# Patient Record
Sex: Female | Born: 1940 | Race: White | Hispanic: No | Marital: Single | Smoking: Never smoker
Health system: Southern US, Community
[De-identification: ages and names within clinical notes are randomized; demographics above are authoritative.]

## PROBLEM LIST (undated history)

## (undated) DIAGNOSIS — I4892 Unspecified atrial flutter: Secondary | ICD-10-CM

## (undated) DIAGNOSIS — M519 Unspecified thoracic, thoracolumbar and lumbosacral intervertebral disc disorder: Secondary | ICD-10-CM

## (undated) DIAGNOSIS — F419 Anxiety disorder, unspecified: Secondary | ICD-10-CM

## (undated) DIAGNOSIS — J9690 Respiratory failure, unspecified, unspecified whether with hypoxia or hypercapnia: Secondary | ICD-10-CM

## (undated) DIAGNOSIS — I480 Paroxysmal atrial fibrillation: Secondary | ICD-10-CM

## (undated) DIAGNOSIS — F329 Major depressive disorder, single episode, unspecified: Secondary | ICD-10-CM

## (undated) DIAGNOSIS — E669 Obesity, unspecified: Secondary | ICD-10-CM

## (undated) DIAGNOSIS — I10 Essential (primary) hypertension: Secondary | ICD-10-CM

## (undated) DIAGNOSIS — J449 Chronic obstructive pulmonary disease, unspecified: Secondary | ICD-10-CM

## (undated) DIAGNOSIS — E785 Hyperlipidemia, unspecified: Secondary | ICD-10-CM

## (undated) DIAGNOSIS — G4733 Obstructive sleep apnea (adult) (pediatric): Secondary | ICD-10-CM

## (undated) DIAGNOSIS — F32A Depression, unspecified: Secondary | ICD-10-CM

## (undated) DIAGNOSIS — J45909 Unspecified asthma, uncomplicated: Secondary | ICD-10-CM

## (undated) HISTORY — DX: Paroxysmal atrial fibrillation: I48.0

## (undated) HISTORY — PX: CATARACT EXTRACTION: SUR2

## (undated) HISTORY — DX: Unspecified thoracic, thoracolumbar and lumbosacral intervertebral disc disorder: M51.9

## (undated) HISTORY — PX: LASIK: SHX215

## (undated) HISTORY — DX: Essential (primary) hypertension: I10

## (undated) HISTORY — DX: Chronic obstructive pulmonary disease, unspecified: J44.9

## (undated) HISTORY — DX: Obstructive sleep apnea (adult) (pediatric): G47.33

## (undated) HISTORY — DX: Major depressive disorder, single episode, unspecified: F32.9

## (undated) HISTORY — DX: Anxiety disorder, unspecified: F41.9

## (undated) HISTORY — PX: ANTERIOR FUSION LUMBAR SPINE: SUR629

## (undated) HISTORY — DX: Unspecified atrial flutter: I48.92

## (undated) HISTORY — PX: BACK SURGERY: SHX140

## (undated) HISTORY — DX: Respiratory failure, unspecified, unspecified whether with hypoxia or hypercapnia: J96.90

## (undated) HISTORY — DX: Obesity, unspecified: E66.9

## (undated) HISTORY — DX: Hyperlipidemia, unspecified: E78.5

## (undated) HISTORY — DX: Depression, unspecified: F32.A

---

## 2003-04-02 ENCOUNTER — Ambulatory Visit: Admission: RE | Admit: 2003-04-02 | Discharge: 2003-04-02 | Payer: Self-pay | Admitting: Internal Medicine

## 2004-04-03 ENCOUNTER — Ambulatory Visit: Payer: Self-pay | Admitting: Internal Medicine

## 2004-05-14 ENCOUNTER — Ambulatory Visit: Payer: Self-pay | Admitting: Internal Medicine

## 2004-07-25 ENCOUNTER — Ambulatory Visit: Payer: Self-pay | Admitting: Internal Medicine

## 2004-08-14 ENCOUNTER — Ambulatory Visit: Payer: Self-pay | Admitting: Internal Medicine

## 2004-09-12 ENCOUNTER — Ambulatory Visit: Payer: Self-pay | Admitting: Internal Medicine

## 2004-11-17 ENCOUNTER — Ambulatory Visit: Payer: Self-pay | Admitting: Cardiology

## 2004-12-05 ENCOUNTER — Ambulatory Visit: Payer: Self-pay | Admitting: Internal Medicine

## 2005-01-23 ENCOUNTER — Ambulatory Visit: Payer: Self-pay | Admitting: Internal Medicine

## 2005-04-16 ENCOUNTER — Ambulatory Visit: Payer: Self-pay | Admitting: Internal Medicine

## 2005-05-22 ENCOUNTER — Ambulatory Visit: Payer: Self-pay | Admitting: Internal Medicine

## 2005-08-04 ENCOUNTER — Ambulatory Visit: Payer: Self-pay | Admitting: Internal Medicine

## 2005-11-25 ENCOUNTER — Ambulatory Visit: Payer: Self-pay | Admitting: Internal Medicine

## 2005-12-14 ENCOUNTER — Ambulatory Visit: Payer: Self-pay | Admitting: Cardiology

## 2006-01-04 ENCOUNTER — Ambulatory Visit: Payer: Self-pay | Admitting: Internal Medicine

## 2006-04-21 ENCOUNTER — Ambulatory Visit (HOSPITAL_COMMUNITY): Admission: RE | Admit: 2006-04-21 | Discharge: 2006-04-21 | Payer: Self-pay | Admitting: Unknown Physician Specialty

## 2006-05-21 ENCOUNTER — Ambulatory Visit: Payer: Self-pay | Admitting: Internal Medicine

## 2006-05-24 ENCOUNTER — Ambulatory Visit: Payer: Self-pay | Admitting: Internal Medicine

## 2006-07-09 ENCOUNTER — Ambulatory Visit: Payer: Self-pay | Admitting: Cardiology

## 2006-07-16 ENCOUNTER — Ambulatory Visit: Payer: Self-pay | Admitting: Cardiology

## 2006-11-02 ENCOUNTER — Ambulatory Visit: Payer: Self-pay | Admitting: Internal Medicine

## 2006-11-15 ENCOUNTER — Ambulatory Visit: Payer: Self-pay | Admitting: Cardiology

## 2006-11-22 ENCOUNTER — Ambulatory Visit: Payer: Self-pay | Admitting: Internal Medicine

## 2007-02-03 ENCOUNTER — Ambulatory Visit: Payer: Self-pay | Admitting: Cardiology

## 2007-04-05 ENCOUNTER — Ambulatory Visit: Payer: Self-pay | Admitting: Internal Medicine

## 2007-05-20 DIAGNOSIS — J209 Acute bronchitis, unspecified: Secondary | ICD-10-CM

## 2007-05-20 DIAGNOSIS — I480 Paroxysmal atrial fibrillation: Secondary | ICD-10-CM

## 2007-05-20 DIAGNOSIS — G4733 Obstructive sleep apnea (adult) (pediatric): Secondary | ICD-10-CM | POA: Insufficient documentation

## 2007-05-20 DIAGNOSIS — K219 Gastro-esophageal reflux disease without esophagitis: Secondary | ICD-10-CM | POA: Insufficient documentation

## 2007-05-23 ENCOUNTER — Ambulatory Visit: Payer: Self-pay | Admitting: Internal Medicine

## 2007-07-22 ENCOUNTER — Ambulatory Visit: Payer: Self-pay | Admitting: Internal Medicine

## 2007-08-09 ENCOUNTER — Encounter: Payer: Self-pay | Admitting: Internal Medicine

## 2007-09-09 ENCOUNTER — Encounter: Payer: Self-pay | Admitting: Cardiology

## 2007-09-15 ENCOUNTER — Ambulatory Visit: Payer: Self-pay | Admitting: Internal Medicine

## 2007-10-27 ENCOUNTER — Ambulatory Visit: Payer: Self-pay | Admitting: Cardiology

## 2007-11-02 ENCOUNTER — Ambulatory Visit: Payer: Self-pay | Admitting: Cardiology

## 2007-11-11 ENCOUNTER — Ambulatory Visit: Payer: Self-pay | Admitting: Cardiology

## 2007-11-24 ENCOUNTER — Ambulatory Visit (HOSPITAL_COMMUNITY): Payer: Self-pay | Admitting: Psychiatry

## 2007-12-07 ENCOUNTER — Ambulatory Visit (HOSPITAL_COMMUNITY): Payer: Self-pay | Admitting: Psychiatry

## 2007-12-15 ENCOUNTER — Ambulatory Visit (HOSPITAL_COMMUNITY): Payer: Self-pay | Admitting: Psychiatry

## 2007-12-20 ENCOUNTER — Ambulatory Visit: Payer: Self-pay | Admitting: Cardiology

## 2007-12-27 ENCOUNTER — Ambulatory Visit (HOSPITAL_COMMUNITY): Payer: Self-pay | Admitting: Psychiatry

## 2008-01-10 ENCOUNTER — Ambulatory Visit (HOSPITAL_COMMUNITY): Payer: Self-pay | Admitting: Psychiatry

## 2008-01-24 ENCOUNTER — Ambulatory Visit (HOSPITAL_COMMUNITY): Payer: Self-pay | Admitting: Psychiatry

## 2008-02-02 ENCOUNTER — Ambulatory Visit (HOSPITAL_COMMUNITY): Payer: Self-pay | Admitting: Psychiatry

## 2008-02-20 ENCOUNTER — Ambulatory Visit (HOSPITAL_COMMUNITY): Payer: Self-pay | Admitting: Psychiatry

## 2008-03-07 ENCOUNTER — Ambulatory Visit (HOSPITAL_COMMUNITY): Payer: Self-pay | Admitting: Psychiatry

## 2008-03-26 ENCOUNTER — Ambulatory Visit (HOSPITAL_COMMUNITY): Payer: Self-pay | Admitting: Psychiatry

## 2008-03-30 HISTORY — PX: TOTAL HIP ARTHROPLASTY: SHX124

## 2008-04-03 ENCOUNTER — Ambulatory Visit: Payer: Self-pay | Admitting: Internal Medicine

## 2008-04-09 ENCOUNTER — Ambulatory Visit: Payer: Self-pay | Admitting: Cardiology

## 2008-04-09 ENCOUNTER — Ambulatory Visit (HOSPITAL_COMMUNITY): Payer: Self-pay | Admitting: Psychiatry

## 2008-04-23 ENCOUNTER — Ambulatory Visit (HOSPITAL_COMMUNITY): Payer: Self-pay | Admitting: Psychiatry

## 2008-05-15 ENCOUNTER — Encounter: Payer: Self-pay | Admitting: Cardiology

## 2008-05-16 ENCOUNTER — Ambulatory Visit (HOSPITAL_COMMUNITY): Payer: Self-pay | Admitting: Psychiatry

## 2008-06-05 ENCOUNTER — Ambulatory Visit: Payer: Self-pay | Admitting: Internal Medicine

## 2008-06-21 ENCOUNTER — Ambulatory Visit (HOSPITAL_COMMUNITY): Payer: Self-pay | Admitting: Psychiatry

## 2008-07-20 ENCOUNTER — Ambulatory Visit (HOSPITAL_COMMUNITY): Payer: Self-pay | Admitting: Psychiatry

## 2008-08-20 ENCOUNTER — Ambulatory Visit: Payer: Self-pay | Admitting: Cardiology

## 2008-08-20 ENCOUNTER — Encounter: Payer: Self-pay | Admitting: Physician Assistant

## 2008-08-30 ENCOUNTER — Encounter: Payer: Self-pay | Admitting: Cardiology

## 2008-08-30 ENCOUNTER — Ambulatory Visit: Payer: Self-pay | Admitting: Cardiology

## 2008-08-31 ENCOUNTER — Ambulatory Visit (HOSPITAL_COMMUNITY): Payer: Self-pay | Admitting: Psychiatry

## 2008-09-10 ENCOUNTER — Encounter: Payer: Self-pay | Admitting: Cardiology

## 2008-09-13 ENCOUNTER — Ambulatory Visit (HOSPITAL_COMMUNITY): Payer: Self-pay | Admitting: Psychiatry

## 2008-09-17 ENCOUNTER — Ambulatory Visit: Payer: Self-pay | Admitting: Cardiology

## 2008-09-27 ENCOUNTER — Ambulatory Visit (HOSPITAL_COMMUNITY): Payer: Self-pay | Admitting: Psychiatry

## 2008-10-02 ENCOUNTER — Ambulatory Visit: Payer: Self-pay | Admitting: Internal Medicine

## 2008-10-05 ENCOUNTER — Telehealth: Payer: Self-pay | Admitting: Cardiology

## 2008-10-11 ENCOUNTER — Ambulatory Visit (HOSPITAL_COMMUNITY): Payer: Self-pay | Admitting: Psychiatry

## 2008-10-24 ENCOUNTER — Ambulatory Visit: Payer: Self-pay | Admitting: Cardiology

## 2008-10-24 ENCOUNTER — Encounter: Payer: Self-pay | Admitting: Physician Assistant

## 2008-10-25 ENCOUNTER — Ambulatory Visit (HOSPITAL_COMMUNITY): Payer: Self-pay | Admitting: Psychiatry

## 2008-10-25 ENCOUNTER — Telehealth: Payer: Self-pay | Admitting: Physician Assistant

## 2008-11-06 ENCOUNTER — Encounter: Admission: RE | Admit: 2008-11-06 | Discharge: 2008-11-06 | Payer: Self-pay | Admitting: Neurosurgery

## 2008-11-08 ENCOUNTER — Ambulatory Visit: Payer: Self-pay | Admitting: Cardiology

## 2008-11-08 ENCOUNTER — Ambulatory Visit (HOSPITAL_COMMUNITY): Payer: Self-pay | Admitting: Psychiatry

## 2008-11-21 ENCOUNTER — Encounter: Admission: RE | Admit: 2008-11-21 | Discharge: 2008-11-21 | Payer: Self-pay | Admitting: Neurosurgery

## 2008-11-22 DIAGNOSIS — E785 Hyperlipidemia, unspecified: Secondary | ICD-10-CM | POA: Insufficient documentation

## 2008-11-22 DIAGNOSIS — E663 Overweight: Secondary | ICD-10-CM | POA: Insufficient documentation

## 2008-11-22 DIAGNOSIS — I10 Essential (primary) hypertension: Secondary | ICD-10-CM | POA: Insufficient documentation

## 2008-11-22 DIAGNOSIS — R0602 Shortness of breath: Secondary | ICD-10-CM | POA: Insufficient documentation

## 2008-11-28 ENCOUNTER — Ambulatory Visit: Payer: Self-pay | Admitting: Cardiology

## 2008-11-28 DIAGNOSIS — I495 Sick sinus syndrome: Secondary | ICD-10-CM | POA: Insufficient documentation

## 2008-12-06 ENCOUNTER — Ambulatory Visit (HOSPITAL_COMMUNITY): Payer: Self-pay | Admitting: Psychiatry

## 2009-01-15 ENCOUNTER — Encounter: Payer: Self-pay | Admitting: Physician Assistant

## 2009-01-16 ENCOUNTER — Encounter: Payer: Self-pay | Admitting: Cardiology

## 2009-01-17 ENCOUNTER — Telehealth: Payer: Self-pay | Admitting: Internal Medicine

## 2009-01-17 ENCOUNTER — Telehealth (INDEPENDENT_AMBULATORY_CARE_PROVIDER_SITE_OTHER): Payer: Self-pay | Admitting: *Deleted

## 2009-01-18 ENCOUNTER — Telehealth (INDEPENDENT_AMBULATORY_CARE_PROVIDER_SITE_OTHER): Payer: Self-pay | Admitting: *Deleted

## 2009-01-18 ENCOUNTER — Encounter: Payer: Self-pay | Admitting: Cardiology

## 2009-02-01 ENCOUNTER — Telehealth: Payer: Self-pay | Admitting: Internal Medicine

## 2009-03-06 ENCOUNTER — Inpatient Hospital Stay: Admission: AD | Admit: 2009-03-06 | Discharge: 2009-04-02 | Payer: Self-pay | Admitting: Internal Medicine

## 2009-04-03 ENCOUNTER — Telehealth (INDEPENDENT_AMBULATORY_CARE_PROVIDER_SITE_OTHER): Payer: Self-pay | Admitting: *Deleted

## 2009-04-11 ENCOUNTER — Ambulatory Visit: Payer: Self-pay | Admitting: Cardiology

## 2009-06-07 ENCOUNTER — Ambulatory Visit: Payer: Self-pay | Admitting: Internal Medicine

## 2009-06-07 DIAGNOSIS — J309 Allergic rhinitis, unspecified: Secondary | ICD-10-CM | POA: Insufficient documentation

## 2009-06-14 ENCOUNTER — Telehealth (INDEPENDENT_AMBULATORY_CARE_PROVIDER_SITE_OTHER): Payer: Self-pay | Admitting: *Deleted

## 2009-06-21 ENCOUNTER — Telehealth (INDEPENDENT_AMBULATORY_CARE_PROVIDER_SITE_OTHER): Payer: Self-pay | Admitting: *Deleted

## 2009-07-31 ENCOUNTER — Encounter: Payer: Self-pay | Admitting: Internal Medicine

## 2009-09-13 ENCOUNTER — Encounter: Payer: Self-pay | Admitting: Cardiology

## 2009-09-24 ENCOUNTER — Encounter: Payer: Self-pay | Admitting: Cardiology

## 2009-10-04 ENCOUNTER — Ambulatory Visit: Payer: Self-pay | Admitting: Cardiology

## 2009-10-28 ENCOUNTER — Telehealth (INDEPENDENT_AMBULATORY_CARE_PROVIDER_SITE_OTHER): Payer: Self-pay | Admitting: *Deleted

## 2010-01-09 ENCOUNTER — Encounter: Payer: Self-pay | Admitting: Internal Medicine

## 2010-02-24 ENCOUNTER — Encounter: Payer: Self-pay | Admitting: Cardiology

## 2010-04-07 ENCOUNTER — Ambulatory Visit
Admission: RE | Admit: 2010-04-07 | Discharge: 2010-04-07 | Payer: Self-pay | Source: Home / Self Care | Attending: Cardiology | Admitting: Cardiology

## 2010-04-07 ENCOUNTER — Encounter: Payer: Self-pay | Admitting: Cardiology

## 2010-04-15 ENCOUNTER — Telehealth: Payer: Self-pay | Admitting: Cardiology

## 2010-04-20 ENCOUNTER — Encounter: Payer: Self-pay | Admitting: *Deleted

## 2010-04-20 ENCOUNTER — Encounter: Payer: Self-pay | Admitting: Internal Medicine

## 2010-05-01 NOTE — Letter (Signed)
Summary: CMN for CPAP Supplies/[facility]  CMN for CPAP Supplies/[facility]   Imported By: Sherian Rein 08/07/2009 10:09:38  _____________________________________________________________________  External Attachment:    Type:   Image     Comment:   External Document

## 2010-05-01 NOTE — Assessment & Plan Note (Signed)
Summary: 3 MO FU PER DEC REMINDER-SRS   Visit Type:  Follow-up Primary Provider:  Dr. Kirstie Peri   History of Present Illness: 70 year old [name] [name] presents for a followup visit. She underwent right hip replacement at [facility], followed by a course of rehabilitation. She is now at home with home health nursing, and ambulating with a walker, soon to be using a cane only. She reports feeling much better with less hip discomfort over time.   She is back on Coumadin under the direction of Dr. Sherryll Burger. She has had no significant palpitations or limiting breathlessness, and continues to take atenolol. She was concerned for time about hair loss, although this has stabilized, and she prefers to stay on pindolol.  Preventive Screening-Counseling & Management  Alcohol-Tobacco     Smoking Status: never  Current Medications (verified): 1)  Warfarin Sodium 5 Mg Tabs (Warfarin Sodium) .... Take As Directed 2)  Klor-Con M20 20 Meq  Tbcr (Potassium Chloride Crys Cr) .... Take 1 Tablet By Mouth Once A Day 3)  Hydrochlorothiazide 25 Mg Tabs (Hydrochlorothiazide) .... Take 1 By Mouth Once Daily 4)  Prilosec Otc 20 Mg  Tbec (Omeprazole Magnesium) .... One Tab Two Times A Day 5)  Flonase 50 Mcg/act Susp (Fluticasone Propionate) .... As Needed 6)  Pulmicort 0.5 Mg/78ml  Susp (Budesonide) .... Once Daily 7)  Restasis 0.05 % Emul (Cyclosporine) .Marland Kitchen.. 1 Drop Both Eyes Two Times A Day 8)  Biotin Forte 5 Mg  Tabs (Biotin) .... Once Daily 9)  Flaxseed Oil 1000 Mg  Caps (Flaxseed (Linseed)) .... Take 1 Tablet By Mouth Once A Day 10)  Cpap 10 Cwp [employer] .... Heated Humidifier and Supplies For Dx [employer] 11)  Red Yeast Rice 1200mg  .... Take 1 Tablet By Mouth Once A Day 12)  Epipen 2-Pak 0.3 Mg/0.60ml (1:1000)  Devi (Epinephrine Hcl (Anaphylaxis)) .... For Severe Allergic Reaction 13)  Clonazepam 0.5 Mg Tabs (Clonazepam) .... Take 1 Tablet By Mouth Once A Day 14)  Pindolol 5 Mg Tabs (Pindolol) .... Take 1 Tablet By  Mouth Two Times A Day 15)  Calcium Carbonate-Vitamin D 600-400 Mg-Unit  Tabs (Calcium Carbonate-Vitamin D) .... Take 1 Tablet By Mouth Once A Day 16)  Lisinopril 40 Mg Tabs (Lisinopril) .... Take 1 Tablet By Mouth Once A Day 17)  Hydromet 5-1.5 Mg/72ml Syrp (Hydrocodone-Homatropine) .... As Needed 18)  Gabapentin 300 Mg Caps (Gabapentin) .... Take 1 Tablet By Mouth Two Times A Day  Allergies: 1)  ! Penicillin 2)  ! * Advair 3)  ! * Serevent 4)  ! * Antihistamines 5)  ! * Lexapro 6)  ! Microdantin 7)  ! * Tetanus 8)  ! Wellbutrin 9)  Amoxicillin (Amoxicillin)  Comments:  Nurse/Medical Assistant: The patient is currently on medications but does not know the name or dosage at this time. Instructed to contact our office with details. Will update medication list at that time.  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  The patient denies vision loss, chest pain, syncope, peripheral edema, headaches, melena, and hematochezia.         No bleeding problems. Otherwise  reviewed and negative.  Vital Signs:  Patient profile:   70 year old female Height:      65 inches Weight:      179 pounds BMI:     29.89 Pulse rate:   72 / minute BP sitting:   113 / 73  (left arm) Cuff size:   large  Vitals Entered By: Carlye Grippe (April 11, 2009 9:17 AM)  Nutrition Counseling: Patient's BMI is greater than 25 and therefore counseled on weight management options.   Physical Exam  Additional Exam:  Obese [name] [name] in no acute distress. HEENT: Conjunctivae and lids are  normal, oropharynx clear with moist mucosa. Neck: Supple, no carotid bruits. Lungs: Clear to auscultation, nonlabored breathing. Cardiac: Regular rate and rhythm, indistinct PMI, no pathologic systolic murmur. Abdomen: Obese, bowel sounds present, nontender. Extremities: No pitting edema. Distal pulses 1+. Skin: Warm and dry. Musculoskeletal: Alert and oriented x3, affect appropriate.   EKG  Procedure date:  04/11/2009  Findings:      Normal sinus rhythm at 76 beats per minute with occasional premature atrial complexes.  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 She is in sinus rhythm today and has had no recent palpitations. Follow up will be in 6 months.  Her updated medication list for this problem includes:    Warfarin Sodium 5 Mg Tabs (Warfarin sodium) .Marland Kitchen... Take as directed    Pindolol 5 Mg Tabs (Pindolol) .Marland Kitchen... Take 1 tablet by mouth two times a day  Orders: EKG w/ Interpretation (93000)  Problem # 2:  HYPERTENSION, UNSPECIFIED (ICD-401.9)  Blood pressure well controlled today.  Her updated medication list for this problem includes:    Hydrochlorothiazide 25 Mg Tabs (Hydrochlorothiazide) .Marland Kitchen... Take 1 by mouth once daily    Pindolol 5 Mg Tabs (Pindolol) .Marland Kitchen... Take 1 tablet by mouth two times a day    Lisinopril 40 Mg Tabs (Lisinopril) .Marland Kitchen... Take 1 tablet by mouth once a day  Patient Instructions: 1)  Your physician wants you to follow-up in: 6 months. You will receive a reminder letter in the mail one-two months in advance. If you don't receive a letter, please call our office to schedule the follow-up appointment. 2)  Your physician recommends that you continue on your current medications as directed. Please refer to the Current Medication list given to you today.

## 2010-05-01 NOTE — Progress Notes (Signed)
Summary: ov notes/ fax request  Phone Note From Other Clinic   Caller: [employer] For: young Summary of Call: needs 06/07/09 ov notes re: OSA/ reason for cpap. fax to [phone]. contact # [phone] Initial call taken by: Tivis Ringer, CNA,  June 14, 2009 9:21 AM  Follow-up for Phone Call        please advise when OV note is completed and I will fax to [facility]. thanks. Carron Curie CMA  June 14, 2009 10:40 AM   Additional Follow-up for Phone Call Additional follow up Details #1::        We do not send progress notes to DME. They can send [place] a request for the specific information they need. Additional Follow-up by: Waymon Budge MD,  June 18, 2009 10:47 AM    Additional Follow-up for Phone Call Additional follow up Details #2::    Spoke with Tammy at [facility]  She states that she will fax information to triage fax.   Follow-up by: Vernie Murders,  June 18, 2009 5:20 PM   Appended Document: ov notes/ fax request Notes were approved by [facility] to send to [facility]. Faxed to company on 07-01-09 as requested.

## 2010-05-01 NOTE — Progress Notes (Signed)
Summary: PHONE: MEDICATION AND BP  Phone Note Call from Patient Call back at Home Phone [phone]   Reason for Call: Acute Illness Summary of Call: Patient continues to have a very bad cough that once she starts she will cough up to 10-15 minutes. She has seen Dr. Sherryll Burger and was placed on antiobotics 4 times. Patient wants to know if her BP medication could be causing this cough. She is set for surgery on August 10th and very concerned about the cough and her surgery. Initial call taken by: Zachary George,  October 28, 2009 11:32 AM  Follow-up for Phone Call        Could potentially be related to Lisinopril.  If she would like, we could discontinue and change to equivalent dose of Cozaar. Follow-up by: Loreli Slot, MD, [facility],  October 28, 2009 12:16 PM  Additional Follow-up for Phone Call Additional follow up Details #1::        Pt would like to change to Cozaar. Dose will be changed to 50mg  by mouth once daily.  Additional Follow-up by: Cyril Loosen, RN, BSN,  October 28, 2009 2:13 PM    New/Updated Medications: COZAAR 50 MG TABS (LOSARTAN POTASSIUM) Take 1 tablet by mouth once a day Prescriptions: COZAAR 50 MG TABS (LOSARTAN POTASSIUM) Take 1 tablet by mouth once a day  #30 x 6   Entered by:   Cyril Loosen, RN, BSN   Authorized by:   Loreli Slot, MD, [facility]   Signed by:   Cyril Loosen, RN, BSN on 10/28/2009   Method used:   Electronically to        [facility]* (retail)       [address]       [place], [place]  [address]       Ph: [phone]       Fax: [phone]   RxID:   [id number]

## 2010-05-01 NOTE — Letter (Signed)
Summary: Orders / [facility]  Orders / [facility]   Imported By: Lennie Odor 01/20/2010 14:59:03  _____________________________________________________________________  External Attachment:    Type:   Image     Comment:   External Document

## 2010-05-01 NOTE — Progress Notes (Signed)
Summary: Palpitations  Phone Note Call from Patient Call back at Home Phone [phone]   Caller: Patient Summary of Call: Ms. frid called stating that she felt her heart pounding last evening. States that she is having upcoming surgery and she wants to know if Dr. Diona Browner will call in her Cartizem again. States that she feels she needs it. [facility] Initial call taken by: Zachary George,  April 15, 2010 9:18 AM  Follow-up for Phone Call        Per pt's April 07, 2010 office visit "She reports one episode of prolonged palpitations since I saw her. If this progresses, we could consider using p.r.n. low-dose diltiazem which was effective in the past. No plan for antiarrhythmic therapy at this time."  Will have Dr. Diona Browner review and advise further. Follow-up by: Cyril Loosen, RN, BSN,  April 15, 2010 10:54 AM  Additional Follow-up for Phone Call Additional follow up Details #1::        Can use Cardizem 30 mg by mouth as needed - no more than Q6 hours - for palpitations. Additional Follow-up by: Loreli Slot, MD, [facility],  April 15, 2010 12:03 PM     Appended Document: Palpitations Pt notified and verbalized understanding.   Clinical Lists Changes  Medications: Added new medication of DILTIAZEM HCL 30 MG TABS (DILTIAZEM HCL) Take 1 tablet by mouth every 6 hours as needed for prolonged palpitations. - Signed Rx of DILTIAZEM HCL 30 MG TABS (DILTIAZEM HCL) Take 1 tablet by mouth every 6 hours as needed for prolonged palpitations.;  #60 x 2;  Signed;  Entered by: Cyril Loosen, RN, BSN;  Authorized by: Loreli Slot, MD, [facility];  Method used: Electronically to [facility] [address], [place], [place], [place]  [address], Ph: [phone], Fax: [phone]    Prescriptions: DILTIAZEM HCL 30 MG TABS (DILTIAZEM HCL) Take 1 tablet by mouth every 6 hours as needed for prolonged palpitations.  #60 x 2   Entered by:   Cyril Loosen, RN, BSN  Authorized by:   Loreli Slot, MD, [facility]   Signed by:   Cyril Loosen, RN, BSN on 04/15/2010   Method used:   Electronically to        [facility]* (retail)       [address]       [place], [place]  [address]       Ph: [phone]       Fax: [phone]   RxID:   [id number]

## 2010-05-01 NOTE — Assessment & Plan Note (Signed)
Summary: 6 mo fu & may need clearance for hip   Visit Type:  Follow-up Primary Provider:  Dr. Kirstie Peri   History of Present Illness: 70 year old woman presents for followup. She was seen back in July 2011. Since then, she has undergone some spinal fusion surgeries at [facility], also has plans for a left hip replacement at [facility] next month. Main limitation is of leg discomfort. She has been able to [facility] 2-3 days a week [facility], doing some swimming, light weights, and also a recumbent bicycle. She reports no unusual shortness of breath with activity or chest pain, rarely has palpitations that are prolonged.  Denies any bleeding problems on Coumadin. Continue to tolerate pindolol fairly well.  No clearly documented evidence of obstructive CAD noted based on prior evaluation, LVEF has been normal.  Preventive Screening-Counseling & Management  Alcohol-Tobacco     Smoking Status: never  Current Medications (verified): 1)  Warfarin Sodium 5 Mg Tabs (Warfarin Sodium) .... Take As Directed 2)  Klor-Con M20 20 Meq  Tbcr (Potassium Chloride Crys Cr) .... Take 1 Tablet By Mouth Once A Day 3)  Hydrochlorothiazide 25 Mg Tabs (Hydrochlorothiazide) .... Take 1 By Mouth Once Daily 4)  Flonase 50 Mcg/act Susp (Fluticasone Propionate) .... As Needed 5)  Pulmicort 0.5 Mg/90ml  Susp (Budesonide) .... Once Daily 6)  Restasis 0.05 % Emul (Cyclosporine) .Marland Kitchen.. 1 Drop Both Eyes Two Times A Day 7)  Biotin Forte 5 Mg  Tabs (Biotin) .... Once Daily 8)  Flaxseed Oil 1000 Mg  Caps (Flaxseed (Linseed)) .... Take 1 Tablet By Mouth Once A Day 9)  Cpap 10 Cwp [employer] .... Heated Humidifier and Supplies For Dx [employer] 10)  Red Yeast Rice 1200mg  .... Take 1 Tablet By Mouth Once A Day 11)  Pindolol 5 Mg Tabs (Pindolol) .... Take 1 Tablet By Mouth Two Times A Day 12)  Calcium Carbonate-Vitamin D 600-400 Mg-Unit  Tabs (Calcium Carbonate-Vitamin D) .... Take 1 Tablet By Mouth Once A Day 13)  Cozaar 50 Mg Tabs  (Losartan Potassium) .... Take 1 Tablet By Mouth Once A Day 14)  Hydromet 5-1.5 Mg/109ml Syrp (Hydrocodone-Homatropine) .... As Needed 15)  Fish Oil 1000 Mg Caps (Omega-3 Fatty Acids) .... Take 2 Tablet By Mouth Once A Day 16)  Prilosec 20 Mg Cpdr (Omeprazole) .... Take 1 Tablet By Mouth Two Times A Day 17)  Hydrocodone-Acetaminophen 5-500 Mg Tabs (Hydrocodone-Acetaminophen) .... Take 1/2 Tablet Every 4-6 Hours As Needed. 18)  Diazepam 5 Mg/ml Soln (Diazepam) .... Take 1 Tablet By Mouth Three Times A Day For Muscle Spasams 19)  Eryped 400 400 Mg/52ml Susr (Erythromycin Ethylsuccinate) .... Take 2 Tablets 1 Hour Before Dental Appt. 20)  Benefiber  Powd (Wheat Dextrin) .... As Needed For Constipation 21)  Nasal Moisturizer 0.65 % Soln (Saline) .... As Needed  Allergies: 1)  ! Penicillin 2)  ! * Advair 3)  ! * Serevent 4)  ! * Antihistamines 5)  ! * Lexapro 6)  ! Microdantin 7)  ! Wellbutrin 8)  Amoxicillin (Amoxicillin)  Comments:  Nurse/Medical Assistant: The patient's medications and allergies were reviewed with the patient and were updated in the Medication and Allergy Lists. Reviewed list w/ pt. 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  The patient denies anorexia, fever, weight gain, chest pain, syncope, peripheral edema, abdominal pain, melena, and hematochezia.         Otherwise reviewed and negative except as outlined.  Vital Signs:  Patient profile:   70 year old female Height:      65  inches Weight:      171 pounds BMI:     28.56 Pulse rate:   59 / minute BP sitting:   127 / 78  (left arm) Cuff size:   regular  Vitals Entered By: Fuller Plan CMA (April 07, 2010 8:48 AM)  Physical Exam  Additional Exam:  Overweight woman in no acute distress. HEENT: Conjunctiva and lids normal, oropharynx with moist mucosa. Neck: Supple, no elevated JVP or bruits. Lungs: Clear to auscultation, diminished, nonlabored. Cardiac: Regular rate and rhythm, distant heart sounds, no significant systolic murmur. Abdomen: Soft, nontender, bowel sounds present. Extremities: No pitting edema. Skin: Warm and dry. Musculoskeletal: No kyphosis.  Neuropsychiatric: Alert and oriented x3, affect appropriate.    EKG  Procedure date:  04/07/2010  Findings:      Sinus rhythm at 62 beats per minute.  Prior Report Reviewed for Echocardiogram:  Findings: 11/02/2007 1. The estimated ejection fraction is 55-60%.          2. Global left ventricular wall motion and contractility are within         normal limits.         3. There is mitral annular calcification.         4. Mild aortic cusp sclerosis is present.         5. There is mild tricuspid regurgitation.         6. The pericardium appears normal.  Comments:    Impression & Recommendations:  Problem # 1:  ATRIAL FIBRILLATION (ICD-427.31)  Paroxysmal, well controlled on present medications. She continues on Coumadin. She reports one episode of prolonged palpitations since I saw her. If this progresses, we could consider using p.r.n. low-dose diltiazem which was effective in the past. No plan for antiarrhythmic therapy at this time.  Her updated medication list for this problem includes:    Warfarin Sodium 5 Mg Tabs (Warfarin sodium) .Marland Kitchen... Take as directed    Pindolol 5 Mg Tabs (Pindolol) .Marland Kitchen... Take 1 tablet by mouth two times a day  Orders: EKG w/ Interpretation (93000)  Problem # 2:  BRADYCARDIA-TACHYCARDIA SYNDROME  (ICD-427.81)  Tolerating pindolol well. No symptomatic bradycardia. No syncope.  Her updated medication list for this problem includes:    Warfarin Sodium 5 Mg Tabs (Warfarin sodium) .Marland Kitchen... Take as directed    Pindolol 5 Mg Tabs (Pindolol) .Marland Kitchen... Take 1 tablet by mouth two times a day  Problem # 3:  PREOPERATIVE EXAMINATION (ICD-V72.84)  Patient being considered for elective left hip replacement in February at [facility]. Symptomatically stable with adequate functional capacity despite orthopedic limitations. ECG reviewed above. No plan for further ischemic workup at this point.  Problem # 4:  HYPERTENSION, UNSPECIFIED (ICD-401.9)  Blood pressure reasonably well controlled today.  Her updated medication list for this problem includes:    Hydrochlorothiazide 25 Mg Tabs (Hydrochlorothiazide) .Marland Kitchen... Take 1 by mouth once daily    Pindolol 5 Mg Tabs (Pindolol) .Marland Kitchen... Take 1 tablet by mouth two times a day  Cozaar 50 Mg Tabs (Losartan potassium) .Marland Kitchen... Take 1 tablet by mouth once a day  Patient Instructions: 1)  Your physician recommends that you continue on your current medications as directed. Please refer to the Current Medication list given to you today. 2)  Follow up in  6 months

## 2010-05-01 NOTE — Progress Notes (Signed)
Summary: Med question  Phone Note Call from Patient Call back at Home Phone [phone] Call back at [phone]   Summary of Call: Pt states she has been in rehab following a THR on 03/01/2009. Pt states when she was d/c'd from rehab today they gave her a list of the medications she had been given while in rehab. She states diltiazem XR 120mg  was listed on this medication list as well as Pindolol. She called to confirm that we had d/c'd diltiazem when we started Pindolol. Pt notified per July office visit diltiazem 120 was d/c'd and pindolol 5mg  by mouth two times a day was initiated. Pt verbalized understanding. Pt wants to know if it is okay to take a hair vitamin b/c her hair is still falling out as per previous note. She will discuss if this vitamin is safe with primary MD.  Initial call taken by: Cyril Loosen, RN, BSN,  April 03, 2009 4:47 PM

## 2010-05-01 NOTE — Assessment & Plan Note (Signed)
Summary: 6 month fu-vs   Visit Type:  Follow-up Primary Provider:  Dr. Kirstie Peri   History of Present Illness: 70 year old woman presents for followup. I last saw her back in January. From a cardiac perspective she denies any major problems with palpitations, denies any significant chest pain, and reports compliance with her medications. She denies any significant bleeding problems on Coumadin. She does state that she has been taking her pindolol once daily in error, realizing this recently.  Main complaint is of problems with left leg pain and weakness, presently being evaluated by an orthopedic surgeon, and with plans for referral for assessment by a neurosurgeon at [facility]. She has a diagnosed lumbar compression fracture. Recent MRI is outlined below. She is using a walker to ambulate. She is status post right hip replacement.  Preventive Screening-Counseling & Management  Alcohol-Tobacco     Smoking Status: never  Current Medications (verified): 1)  Warfarin Sodium 5 Mg Tabs (Warfarin Sodium) .... Take As Directed 2)  Klor-Con M20 20 Meq  Tbcr (Potassium Chloride Crys Cr) .... Take 1 Tablet By Mouth Once A Day 3)  Hydrochlorothiazide 25 Mg Tabs (Hydrochlorothiazide) .... Take 1 By Mouth Once Daily 4)  Aciphex 20 Mg Tbec (Rabeprazole Sodium) .... Take 1 Tablet By Mouth Once A Day 5)  Flonase 50 Mcg/act Susp (Fluticasone Propionate) .... As Needed 6)  Pulmicort 0.5 Mg/79ml  Susp (Budesonide) .... Once Daily 7)  Restasis 0.05 % Emul (Cyclosporine) .Marland Kitchen.. 1 Drop Both Eyes Two Times A Day 8)  Biotin Forte 5 Mg  Tabs (Biotin) .... Once Daily 9)  Flaxseed Oil 1000 Mg  Caps (Flaxseed (Linseed)) .... Take 1 Tablet By Mouth Once A Day 10)  Cpap 10 [employer] .... Heated Humidifier and Supplies For Dx [employer] 11)  Red Yeast Rice 1200mg  .... Take 1 Tablet By Mouth Once A Day 12)  Pindolol 5 Mg Tabs (Pindolol) .... Take 1 Tablet By Mouth Two Times A Day 13)  Calcium Carbonate-Vitamin D  600-400 Mg-Unit  Tabs (Calcium Carbonate-Vitamin D) .... Take 1 Tablet By Mouth Once A Day 14)  Lisinopril 40 Mg Tabs (Lisinopril) .... Take 1 Tablet By Mouth Once A Day 15)  Hydromet 5-1.5 Mg/76ml Syrp (Hydrocodone-Homatropine) .... As Needed 16)  Fish Oil 1000 Mg Caps (Omega-3 Fatty Acids) .... Take 2 Tablet By Mouth Once A Day 17)  Cyclobenzaprine Hcl 10 Mg Tabs (Cyclobenzaprine Hcl) .... Take 1/2 Tablet By Mouth Once A Day At  Bedtime As Needed 18)  Gabapentin 300 Mg Caps (Gabapentin) .... Take 1 Tablet By Mouth Three Times A Day 19)  Xopenex 0.63 Mg/59ml Nebu (Levalbuterol Hcl) .... One Neb Tx Two Times A Day  Allergies (verified): 1)  ! Penicillin 2)  ! * Advair 3)  ! * Serevent 4)  ! * Antihistamines 5)  ! * Lexapro 6)  ! Microdantin 7)  ! * Tetanus 8)  ! Wellbutrin 9)  Amoxicillin (Amoxicillin)  Comments:  Nurse/Medical Assistant: The patient's medication bottles and allergies were reviewed with the patient and were updated in the Medication and Allergy Lists.  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         Otherwise reviewed and negative except as outlined.  Vital Signs:  Patient profile:   70 year old female Height:      65 inches Weight:      163 pounds Pulse rate:   69 / minute BP sitting:   123 / 71  (left arm) Cuff size:   regular  Vitals Entered By: Carlye Grippe (October 04, 2009 12:57 PM)  Physical Exam  Additional Exam:  Overweight woman in no  acute distress. HEENT: Conjunctiva and lids normal, oropharynx with moist mucosa. Neck: Supple, no elevated JVP or bruits. Lungs: Clear to auscultation, diminished, nonlabored. Cardiac: Regular rate and rhythm, no significant systolic murmur. Abdomen: Soft, nontender, bowel sounds present. Extremities: No pitting edema.    Venous Doppler  Procedure date:  09/16/2009  Findings:      No evidence of lower extremity deep venous thrombosis on the left.  MRI EXAM  Procedure date:  09/24/2009  Findings:      Lumbar imaging reveals acute or subacute compression fracture of L4. Chronic circumferential protrusion of disc material with multifactorial stenosis and potential for neural compression. no change and noncompressive disc pathology at L3-4. No change in facet degeneration at L5-S1.  EKG  Procedure date:  10/04/2009  Findings:      Normal sinus rhythm at 66 beats per minute with normal intervals.  Impression & Recommendations:  Problem # 1:  ATRIAL FIBRILLATION (ICD-427.31)  Paroxysmal, presently in normal sinus rhythm. She continues on Coumadin.  Her updated medication list for this problem includes:    Warfarin Sodium 5 Mg Tabs (Warfarin sodium) .Marland Kitchen... Take as directed    Pindolol 5 Mg Tabs (Pindolol) .Marland Kitchen... Take 1 tablet by mouth two times a day  Orders: EKG w/ Interpretation (93000)  Problem # 2:  BRADYCARDIA-TACHYCARDIA SYNDROME (ICD-427.81)  Symptomatically stable, on pindolol. I asked her to increase this back to b.i.d. dosing as before. Otherwise plan observation.  Her updated medication list for this problem includes:    Warfarin Sodium 5 Mg Tabs (Warfarin sodium) .Marland Kitchen... Take as directed    Pindolol 5 Mg Tabs (Pindolol) .Marland Kitchen... Take 1 tablet by mouth two times a day    Lisinopril 40 Mg Tabs (Lisinopril) .Marland Kitchen... Take 1 tablet by mouth once a day  Problem # 3:  HYPERTENSION, UNSPECIFIED (ICD-401.9)  Blood pressure well controlled today.  Her updated medication  list for this problem includes:    Hydrochlorothiazide 25 Mg Tabs (Hydrochlorothiazide) .Marland Kitchen... Take 1 by mouth once daily    Pindolol 5 Mg Tabs (Pindolol) .Marland Kitchen... Take 1 tablet by mouth two times a day    Lisinopril 40 Mg Tabs (Lisinopril) .Marland Kitchen... Take 1 tablet by mouth once a day  Patient Instructions: 1)  Your physician wants you to follow-up in: 6 months. You will receive a reminder letter in the mail one-two months in advance. If you don't receive a letter, please call our office to schedule the follow-up appointment. 2)  Take pindolol two times a day.

## 2010-05-01 NOTE — Progress Notes (Signed)
Summary: waiting on cpap  Phone Note Call from Patient   Caller: Patient Call For: young Summary of Call: pt frustrated that she still doesn't have her cpap. says this happened before and she would really like to talk to pcc about getting this taken care of asap. cell [phone] Initial call taken by: Tivis Ringer, CNA,  June 21, 2009 10:59 AM  Follow-up for Phone Call        left pt a message order was faxed 06/07/09 [facility] and i will refax it again today  Follow-up by: Oneita Jolly,  June 21, 2009 11:24 AM

## 2010-05-01 NOTE — Assessment & Plan Note (Signed)
Summary: rov/apc   Primary Provider/Referring Provider:  Dr. Kirstie Peri  CC:  Yearly follow up visit-Discuss allergy vaccine and replacing CPAP machine. Marland Kitchen  History of Present Illness:  07/22/07- [name] [name] returns for follow-up of her obstructive sleep apnea and, also her asthmatic bronchitis.  She continues allergy vaccine at one to 10 with no problems.  We have reviewed risk and benefit issues.  She has seen her cardiologist about her paroxysmal atrial fibrillation with no recurrence since August.  She has been more sleepy.  She is not sure that she keeps her CPAP on.  She did not try the Provigil sample Igave her.  She thinks the mask gets loose and leaks.  We discussed getting in touch with the home care company.  06/05/08 OSA,  asthmatic bronchitis, atrial fib Leg pain affects sleep- beiing worked on. Has memory foam mattress pad. Likes full face cpap mask. Still fights sleepiness in day. Nap helps some. Always feels stuffy in nose. Claritin overdries nose. Had significant episode of depression- tapering off Klonopin now. Discussed sedative meds, sleep hygiene. exercise helps some. Trying to lose some weight.  June 07, 2009- Asthmatic bronchitis, OSA, AF Had left hip replacement in December at [facility]. No respiratory complications. Did take her CPAP. She had dropped off her allergy shots some months ago while dealing with pain control. Antihistamines aggravate her dry eyes. Much sinus congestion and drainage this winter with clear mucus.  Pain prevents her from lying in one place long. she shifts from bed to chair and asks about having spare cpap to put in both places. Pressure seems ok.   Current Medications (verified): 1)  Warfarin Sodium 5 Mg Tabs (Warfarin Sodium) .... Take As Directed 2)  Klor-Con M20 20 Meq  Tbcr (Potassium Chloride Crys Cr) .... Take 1 Tablet By Mouth Once A Day 3)  Hydrochlorothiazide 25 Mg Tabs (Hydrochlorothiazide) .... Take 1 By Mouth Once Daily 4)   Prilosec Otc 20 Mg  Tbec (Omeprazole Magnesium) .... One Tab Two Times A Day 5)  Flonase 50 Mcg/act Susp (Fluticasone Propionate) .... As Needed 6)  Pulmicort 0.5 Mg/26ml  Susp (Budesonide) .... Once Daily 7)  Restasis 0.05 % Emul (Cyclosporine) .Marland Kitchen.. 1 Drop Both Eyes Two Times A Day 8)  Biotin Forte 5 Mg  Tabs (Biotin) .... Once Daily 9)  Flaxseed Oil 1000 Mg  Caps (Flaxseed (Linseed)) .... Take 1 Tablet By Mouth Once A Day 10)  Cpap 10 C[employer] .... Heated Humidifier and Supplies For Dx [facility]sa 11)  Red Yeast Rice 1200mg  .... Take 1 Tablet By Mouth Once A Day 12)  Epipen 2-Pak 0.3 Mg/0.69ml (1:1000)  Devi (Epinephrine Hcl (Anaphylaxis)) .... For Severe Allergic Reaction 13)  Clonazepam 0.5 Mg Tabs (Clonazepam) .... Take 1 Tablet By Mouth Once A Day 14)  Pindolol 5 Mg Tabs (Pindolol) .... Take 1 Tablet By Mouth Two Times A Day 15)  Calcium Carbonate-Vitamin D 600-400 Mg-Unit  Tabs (Calcium Carbonate-Vitamin D) .... Take 1 Tablet By Mouth Once A Day 16)  Lisinopril 40 Mg Tabs (Lisinopril) .... Take 1 Tablet By Mouth Once A Day 17)  Hydromet 5-1.5 Mg/62ml Syrp (Hydrocodone-Homatropine) .... As Needed  Allergies (verified): 1)  ! Penicillin 2)  ! * Advair 3)  ! * Serevent 4)  ! * Antihistamines 5)  ! * Lexapro 6)  ! Microdantin 7)  ! * Tetanus 8)  ! 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    Vital Signs:  Patient profile:   70 year old female Height:      65 inches Weight:      176.25 pounds BMI:     29.44 O2 Sat:      99 % on Room air Pulse rate:   66 / minute BP sitting:   116 / 78  (left arm) Cuff size:   regular  Vitals Entered By: Reynaldo Minium CMA (06/12/2009 10:41 AM)  O2 Flow:  Room air  Physical Exam  Additional Exam:  General: A/Ox3; pleasant and cooperative, NAD, stocky build, mood relaxed and appropriate SKIN: no rash, lesions NODES: no lymphadenopathy HEENT: [place]/AT, EOM- WNL, Conjuctivae- clear, PERRLA, TM-WNL, Nose- clear, Throat- clear and wnl, Mallampati  III-IV NECK: Supple w/ fair ROM, JVD- none, normal carotid impulses w/o bruits Thyroid- normal to palpation CHEST: Clear to P&A HEART: RRR, no m/g/r heard ABDOMEN: Soft and nl;  ZOX:WRUE, nl pulses, no edema , limping wih cane NEURO: Grossly intact to observation      Impression & Recommendations:  Problem # 1:  OBSTRUCTIVE SLEEP APNEA (ICD-327.23) Cccompliant with cpap and good control.  Problem # 2:  ASTHMATIC BRONCHITIS, ACUTE (ICD-466.0)  We are stopping her allergy vaccine now, since she has been off it for a few months. We will reassess as needed .She continues Pulmicort by nebulizer once daily. Her updated medication list for this problem includes:    Pulmicort 0.5 Mg/77ml Susp (Budesonide) ..... Once daily    Hydromet 5-1.5 Mg/18ml Syrp (Hydrocodone-homatropine) .Marland Kitchen... As needed  Problem # 3:  ALLERGIC RHINITIS (ICD-477.9)  Vaccine is discontinued. She uses her fluticasone. Her updated  medication list for this problem includes:    Flonase 50 Mcg/act Susp (Fluticasone propionate) .Marland Kitchen... As needed  Orders: Est. Patient Level III (45409)  Other Orders: DME Referral (DME)  Patient Instructions: 1)  Schedule return in one year, earlier if needed 2)  Allergy vaccine is stopped. We can reconsider that in the future if needed. Your fluticasone and Pulmicort may be sufficient. 3)  We will ask [facility] to see whether our order went through to [facility] last Fall to get you a new CPAP machine.

## 2010-05-05 ENCOUNTER — Encounter: Payer: Self-pay | Admitting: Internal Medicine

## 2010-05-05 ENCOUNTER — Ambulatory Visit (INDEPENDENT_AMBULATORY_CARE_PROVIDER_SITE_OTHER): Payer: Medicare Other | Admitting: Internal Medicine

## 2010-05-05 DIAGNOSIS — G4733 Obstructive sleep apnea (adult) (pediatric): Secondary | ICD-10-CM

## 2010-05-05 DIAGNOSIS — J309 Allergic rhinitis, unspecified: Secondary | ICD-10-CM

## 2010-05-05 DIAGNOSIS — J209 Acute bronchitis, unspecified: Secondary | ICD-10-CM

## 2010-05-15 NOTE — Assessment & Plan Note (Signed)
Summary: ROV 12 MOS   Primary Provider/Referring Provider:  Dr. Kirstie Peri  CC:  Yearly follow up visit-OSA and allergies/asthma; dry mouth with CPAP and having nasal congestion with cough-yellow in color..  History of Present Illness:  06/05/08 OSA,  asthmatic bronchitis, atrial fib Leg pain affects sleep- beiing worked on. Has memory foam mattress pad. Likes full face cpap mask. Still fights sleepiness in day. Nap helps some. Always feels stuffy in nose. Claritin overdries nose. Had significant episode of depression- tapering off Klonopin now. Discussed sedative meds, sleep hygiene. exercise helps some. Trying to lose some weight.  June 07, 2009- Asthmatic bronchitis, OSA, AF Had left hip replacement in December at [facility]. No respiratory complications. Did take her CPAP. She had dropped off her allergy shots some months ago while dealing with pain control. Antihistamines aggravate her dry eyes. Much sinus congestion and drainage this winter with clear mucus.  Pain prevents her from lying in one place long. she shifts from bed to chair and asks about having spare cpap to put in both places. Pressure seems ok.  May 05, 2010-  Asthmatic bronchitis, OSA, AF Nurse-CC: Yearly follow up visit-OSA and allergies/asthma; dry mouth with CPAP and having nasal congestion with cough-yellow in color. 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  >OSA/CPAP- OK with CPAP, compliant and effective. Muscle spasms and back pain wake her every 30 minutes. One of her two machines makes her moth drier. Uses Biotene.  > Asthma- Denies wheeze or cough; says control good. Last Spring needed sustained antibiotic for ? bronchitis from her PCP. Now only occasional dry cough.  >Rhinitis- Recently increased drainage w/o feeling need for antibiotic.    Preventive Screening-Counseling & Management  Alcohol-Tobacco     Smoking Status: never  Current Medications (verified): 1)   Warfarin Sodium 5 Mg Tabs (Warfarin Sodium) .... Take As Directed 2)  Klor-Con M20 20 Meq  Tbcr (Potassium Chloride Crys Cr) .... Take 1 Tablet By Mouth Once A Day 3)  Hydrochlorothiazide 25 Mg Tabs (Hydrochlorothiazide) .... Take 1 By Mouth Once Daily 4)  Flonase 50 Mcg/act Susp (Fluticasone Propionate) .... As Needed 5)  Pulmicort 0.5 Mg/48ml  Susp (Budesonide) .... Once Daily 6)  Restasis 0.05 % Emul (Cyclosporine) .Marland Kitchen.. 1 Drop Both Eyes Two Times A Day 7)  Biotin Forte 5 Mg  Tabs (Biotin) .... Once Daily 8)  Flaxseed Oil 1000 Mg  Caps (Flaxseed (Linseed)) .... Take 1 Tablet By Mouth Once A Day 9)  Cpap 10 [employer] .... Heated Humidifier and Supplies For Dx [employer] 10)  Red Yeast Rice 1200mg  .... Take 1 Tablet By Mouth Once A Day 11)  Pindolol 5 Mg Tabs (Pindolol) .... Take 1 Tablet By Mouth Two Times A Day 12)  Calcium Carbonate-Vitamin D 600-400 Mg-Unit  Tabs (Calcium Carbonate-Vitamin D) .... Take 1 Tablet By Mouth Once A Day 13)  Cozaar 50 Mg Tabs (Losartan Potassium) .... Take 1 Tablet By Mouth Once A Day 14)  Hydromet 5-1.5 Mg/56ml Syrp (Hydrocodone-Homatropine) .... As Needed 15)  Fish Oil 1000 Mg Caps (Omega-3 Fatty Acids) .... Take 2 Tablet By Mouth Once A Day 16)  Prilosec 20 Mg Cpdr (Omeprazole) .... Take 1 Tablet By Mouth Two Times A Day 17)  Hydrocodone-Acetaminophen 5-500 Mg Tabs (Hydrocodone-Acetaminophen) .... Take 1/2 Tablet Every 4-6 Hours As Needed. 18)  Diazepam 5 Mg/ml Soln (Diazepam) .... Take 1 Tablet By Mouth Three Times A Day For Muscle Spasams 19)  Eryped 400 400 Mg/53ml Susr (Erythromycin Ethylsuccinate) .... Take 2 Tablets 1 Hour Before Dental Appt. 20)  Benefiber  Powd (Wheat Dextrin) .... As Needed For Constipation 21)  Nasal Moisturizer 0.65 % Soln (Saline) .... As Needed 22)  Diltiazem Hcl 30 Mg Tabs (Diltiazem Hcl) .... Take 1 Tablet By Mouth Every 6 Hours As Needed For Prolonged Palpitations. 23)  Peri-Colace 8.6-50 Mg Tabs (Sennosides-Docusate  Sodium) .... Take As Needed Per Directions On Box For Constipation  Allergies (verified): 1)  ! Penicillin 2)  ! * Advair 3)  ! * Serevent 4)  ! * Antihistamines 5)  ! * Lexapro 6)  ! Microdantin 7)  ! Wellbutrin 8)  ! 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  The patient denies shortness of breath at rest, productive cough, non-productive cough, coughing up blood, chest pain, irregular heartbeats, acid heartburn, indigestion, loss of appetite, weight change, abdominal pain, difficulty swallowing, sore throat, tooth/dental problems, headaches, nasal congestion/difficulty breathing through nose, sneezing, itching, ear ache, hand/feet swelling, rash, change in color of mucus, and fever.    Vital Signs:  Patient profile:   70 year old female Height:      65 inches Weight:      172.38 pounds BMI:     28.79 O2 Sat:      99 % on Room air Pulse rate:   57 / minute BP sitting:   110 / 60  (left arm) Cuff size:   large  Vitals Entered By: Reynaldo Minium CMA (May 05, 2010 9:52 AM)  O2 Flow:  Room air CC: Yearly follow up visit-OSA and allergies/asthma; dry mouth with CPAP and having nasal congestion  with cough-yellow in color.   Physical Exam  Additional Exam:  General: A/Ox3; pleasant and cooperative, NAD, stocky build, uncomfortable from leg spasms, stood up with cane SKIN: no rash, lesions NODES: no lymphadenopathy HEENT: [place]/AT, EOM- WNL, Conjuctivae- clear, PERRLA, TM-WNL, Nose- clear, Throat- clear and wnl, Mallampati  III-IV NECK: Supple w/ fair ROM, JVD- none, normal carotid impulses w/o bruits Thyroid- normal to palpation CHEST: Clear to P&A HEART: RRR, no m/g/r heard ABDOMEN: Soft and nl;  WJX:BJYN, nl pulses, no edema , limping with cane NEURO: Grossly intact to observation      Impression & Recommendations:  Problem # 1:  ALLERGIC RHINITIS (ICD-477.9)  Mild rhinosinusitis. We introduced Neti pot, saline rinse. Her updated medication list for this problem includes:    Flonase 50 Mcg/act Susp (Fluticasone propionate) .Marland Kitchen... As needed    Nasal Moisturizer 0.65 % Soln (Saline) .Marland Kitchen... As needed  Problem # 2:  ASTHMATIC BRONCHITIS, ACUTE (ICD-466.0) Control has been good and i don't anticipate surgical repiratory problems at this time. We discussed ongoing management.  Her updated medication list for  this problem includes:    Pulmicort 0.5 Mg/27ml Susp (Budesonide) ..... Once daily    Hydromet 5-1.5 Mg/20ml Syrp (Hydrocodone-homatropine) .Marland Kitchen... As needed    Eryped 400 400 Mg/76ml Susr (Erythromycin ethylsuccinate) .Marland Kitchen... Take 2 tablets 1 hour before dental appt.  Problem # 3:  OBSTRUCTIVE SLEEP APNEA (ICD-327.23)  Good compliance and control. She has 2 machines and doesn't understand why one dries her worse. I asked her to take both to her DME for them to look at. Uses [employer].   Medications Added to Medication List This Visit: 1)  Peri-colace 8.6-50 Mg Tabs (Sennosides-docusate sodium) .... Take as needed per directions on box for constipation  Other Orders: Est. Patient Level IV (16109)  Patient Instructions: 1)  Please schedule a follow-up appointment  in 1 year. 2)  Good luck with your surgery - I hope it gets you fixed up 3)  Consider trying saline rinse of your nose, like with a Neti pot, when you suspect sinus trouble coming.

## 2010-05-21 ENCOUNTER — Inpatient Hospital Stay
Admission: RE | Admit: 2010-05-21 | Discharge: 2010-07-02 | Disposition: A | Discharge status: Home / Self Care | Payer: BC Managed Care – PPO | Source: Ambulatory Visit | Attending: Internal Medicine | Admitting: Internal Medicine

## 2010-07-08 ENCOUNTER — Telehealth: Payer: Self-pay | Admitting: Internal Medicine

## 2010-07-08 MED ORDER — BUDESONIDE 0.5 MG/2ML IN SUSP: 0.5000 mg | Freq: Every day | RESPIRATORY_TRACT | Status: DC

## 2010-07-08 NOTE — Telephone Encounter (Signed)
Pt states she can no longer get her pulmicort for nebulizer through [facility] and request that an rx be sent to [facility]. RX sent.

## 2010-07-08 NOTE — Telephone Encounter (Signed)
They are maybe just needing dx which is asthma- I tried to call and speak with pharmacist and was placed on hold over 5 mintutes. WCB tomorrow.

## 2010-07-08 NOTE — Telephone Encounter (Signed)
Spoke with Pharmacy-they need [place] to fax a note to them stating why the patient needs Rx-we can fax the information to [phone] unless patient can use albuterol. [name] [name]

## 2010-07-10 NOTE — Telephone Encounter (Addendum)
Will fax this phone note to [facility]. Katie at [facility] is aware.

## 2010-07-10 NOTE — Telephone Encounter (Signed)
She needs budesonide for control of asthmatic bronchitis. Trying to minimize systemic steroids - has hx of aseptic necrosis of hip. Trying to reduce need for sympathetic bronchodilators which aggravate cardiac rhythm.

## 2010-07-17 ENCOUNTER — Other Ambulatory Visit: Payer: Self-pay | Admitting: *Deleted

## 2010-07-17 MED ORDER — PINDOLOL 5 MG PO TABS: 5.0000 mg | ORAL_TABLET | Freq: Two times a day (BID) | ORAL | Status: DC

## 2010-08-12 NOTE — Assessment & Plan Note (Signed)
[facility]                          [name]   ANGENI, CHAUDHURI                   MRN:          [id number]  DATE:12/20/2007                            DOB:          [birth year]    PRIMARY CARE PHYSICIAN:  Kirstie Peri, MD   REASON FOR VISIT:  Routine followup.   HISTORY OF PRESENT ILLNESS:  Ms. Hritz was seen back in August by Mr.  Alben Spittle.  She had been experiencing shortness of breath and was also  having symptoms of depression.  A screening Cardiolite done in early  August was also abnormal with an equivocal anteroseptal defect likely  related to variable soft tissue attenuation, although a mild element of  ischemia could not be excluded.  The t.i.d. ratio was also mildly  increased, although ejection fraction was 69% and cardiac volumes were  otherwise normal.  We elected to follow Ms. Crom clinically rather  than pursuing invasive cardiac assessment and she was most inclined with  seeking further help for her depression symptoms.  She states that she  is seeing a counselor in [place] and may be referred to a  psychiatrist in [place], although in general she states that she is  feeling better.  She is presently being treated with Klonopin 0.5 mg  p.o. b.i.d.  She is now doing water aerobics 4 or 5 days a week for an  hour at a time and denies any chest pain with this.  She states that she  feels better.   ALLERGIES:  PENICILLIN and MACRODANTIN.   PRESENT MEDICATIONS:  1. Lisinopril 20 mg p.o. daily.  2. Klor-Con 20 mEq p.o. daily.  3. Diltiazem CD 240 mg p.o. daily.  4. Restasis eye drops b.i.d.  5. Klonopin 0.5 mg p.o. b.i.d.  6. Biotin Forte 5 mg p.o. daily.  7. Prilosec 20 mg p.o. b.i.d.  8. Hydrochlorothiazide 25 mg p.o. daily.  9. Red yeast rice extract 600 mg p.o. daily.  10.Flaxseed oil extract 1000 mg p.o. daily.  11.Pulmicort 0.5 mg daily.  12.Coumadin as directed by the [facility].   REVIEW OF SYSTEMS:  As described in the history of present illness.  Otherwise negative.   PHYSICAL EXAMINATION:  VITAL SIGNS:  Blood pressure 118/70, heart rate  is 72, and weight is 190 pounds.  GENERAL:  The patient is in no acute distress.  HEENT:  Conjunctiva is normal.  Oropharynx is clear.  NECK:  Supple.  No elevated jugular venous pressure, no loud bruits, no  thyromegaly noted.  LUNGS:  Clear without labored breathing at rest.  CARDIAC:  Regular rate and rhythm.  No pathologic murmur or S3 gallop.  EXTREMITIES:  Exhibit no frank pitting edemas.   IMPRESSION AND RECOMMENDATIONS:  1. History of dyspnea on exertion, improved recently, and in the      absence of any exertional chest pain.  The patient's Cardiolite      results were equivocal in general and at this point we will follow      her on medical therapy.  Some of her symptoms may have actually  overlapped with active depression symptoms, which are also      improving per her report.  I will plan to see her back over the      next 3 months.  2. Paroxysmal atrial fibrillation, stable, and on Coumadin.  She is in      sinus rhythm on examination today.     [name], MD  Electronically Signed    SGM/MedQ  DD: 12/20/2007  DT: 12/20/2007  Job #: [id number]   cc:   Kirstie Peri, MD

## 2010-08-12 NOTE — Assessment & Plan Note (Signed)
[facility]                          [name]   CHERYLN, BALCOM                   MRN:          [id number]  DATE:09/17/2008                            DOB:          May 16, 1940    PRIMARY CARE PHYSICIAN:  Kirstie Peri, MD   REASON FOR VISIT:  Followup cardiac monitor and bradycardia.   HISTORY OF PRESENT ILLNESS:  [name] was seen back in late May.  She has had fairly persistent fatigue and was noted to have evidence of  significant sinus bradycardia, prompting a decrease in her standing  diltiazem CD dose from 180 mg to 120 mg daily.  She continues on  Coumadin under the direction of Dr. Sherryll Burger.  We referred her for a cardiac  monitoring, given prior documentation of paroxysmal atrial fibrillation  and she wore a 48-hour Holter.  She did have atrial runs in the low  100's range that were limited and not prolonged.  She also had  bradycardia with some rates as low as 40, particularly with the lowest  rates being in the very early morning hours when she was asleep (has  sleep apnea on CPAP) and also documentation of junctional bradycardia.  She had no significant pauses and also had premature ventricular  complexes noted.  From a symptom perspective, she states of feeling  better since we cut back her Cardizem CD.  She has had a bout of  depression recently, also having difficulties with back pain, undergoing  evaluation with an MRI at this time.  I reviewed the Holter monitor  strips with her and we talked about the possibility of ultimately  needing a pacemaker if she manifests progressive conduction system  disease/tachycardia-bradycardia syndrome.  At this point, she was most  comfortable with observation.  I am reluctant to stop her Cardizem with  documented bursts of PAT.   ALLERGIES:  Multiple and include LEXAPRO, WELLBUTRIN, PENICILLIN,  MACRODANTIN, and TETANUS TOXOID.   PRESENT MEDICATIONS:  1. Coumadin as  directed by Dr. Sherryll Burger to achieve a therapeutic INR of      2.0-3.0.  2. Klor-Con 20 mEq p.o. daily.  3. Lisinopril 20 mg p.o. daily.  4. Diltiazem CD 120 mg p.o. daily.  5. Celebrex 20 mg p.o. daily.  6. Omega-3 supplements 1200 mg p.o. b.i.d.  7. Red yeast rice extract 1200 mg p.o. daily.  8. Flaxseed oil 1000 mg p.o. daily.  9. Biotin Forte 5 mg p.o. daily.  10.Restasis eye drops twice daily.  11.Pulmicort Respules 0.5 mg daily.  12.Prilosec 20 mg p.o. b.i.d.  13.Clonazepam 0.5 mg 1 tablet p.o. daily.  14.Hydrochlorothiazide 25 mg p.o. daily.   REVIEW OF SYSTEMS:  As outlined above.  No dizziness or syncope.  Otherwise, reviewed and negative.   PHYSICAL EXAMINATION:  VITAL SIGNS:  Blood pressure is 131/71, heart  rate 46-60 beats per minute.  GENERAL:  The patient is overweight in no acute distress.  NECK:  No elevated jugular venous pressure.  LUNGS:  Clear labored breathing at rest.  CARDIAC:  Irregularly irregular rhythm without loud systolic murmur.  Indistinct PMI.  ABDOMEN:  Obese and nontender.  EXTREMITIES:  No pitting edema.   IMPRESSION AND RECOMMENDATIONS:  Paroxysmal atrial fibrillation, on  chronic Coumadin, and with evidence of tachycardia-bradycardia syndrome.  I am not entirely certain whether her persistent fatigue is clearly  related to this or not, however.  Certainly her overnight bradycardia  may well be exacerbated by obstructive sleep apnea.  She is not having  any frank dizziness or syncope.  At this point, she is most comfortable  with observation.  We will plan to continue present dose of Cardizem CD  and I will see her back over the next 3-4 months.  She will continue on  Coumadin under direction of Dr. Sherryll Burger.     [name], MD  Electronically Signed    SGM/MedQ  DD: 09/17/2008  DT: 09/18/2008  Job #: [id number]   cc:   Kirstie Peri, MD

## 2010-08-12 NOTE — Assessment & Plan Note (Signed)
[facility]                          [name]   [name] [name], [name] [name]                   MRN:          [id number]  DATE:10/27/2007                            DOB:          1940/04/14    PRIMARY CARE PHYSICIAN:  [name] Peri, MD.   REASON FOR VISIT:  The patient is scheduled for followup.   HISTORY OF PRESENT ILLNESS:  I saw [name] [name] back in November.  She  presents today to discuss with some problems she has had with shortness  of breath and fatigue, as well as sleepiness.  She has sleep apnea and  reports being very compliant with her CPAP machine, although there is  some question as to whether she is getting effective therapy.  She does  notice problems sometimes with the seal and still wakes up at night.  She was treated back in March 2009 with some antibiotics after tick  bites and had some symptoms of cough and nausea at that time, but these  have generally resolved.  Her main complaint is of significant fatigue  and she also wonders about hospital symptoms of depression.  Her mother  died approximately a year ago and she has had some family problems at  home as well.  She describes some recent soreness in her chest and  back, but feels like it may be musculoskeletal.  She has had more  progressive shortness of breath, but no palpitations.  Today's  electrocardiogram shows sinus rhythm with occasional premature  ventricular complexes and decreased R-wave progression, which is old.  She has not had followup ischemic evaluation since last year.   ALLERGIES:  PENICILLIN and MACRODANTIN.   PRESENT MEDICATIONS:  1. Diltiazem CD 180 mg p.o. q.i.d.  2. Lisinopril 20 mg p.o. daily.  3. Biotin Forte 3 mg p.o. daily.  4. Prilosec 20 mg p.o. b.i.d.  5. Hydrochlorothiazide 25 mg p.o. daily.  6. Red yeast rice extract.  7. Flaxseed Oil with extract.  8. Pulmicort as directed.  9. Coumadin as directed.   REVIEW OF SYSTEMS:  As  per history of present illness.  No bleeding  problems.  No palpitations or syncope.  Otherwise negative.   PHYSICAL EXAMINATION:  VITAL SIGNS:  Blood pressure is 127/64; heart  rate is 61 and regular; weight is 189 pounds, which is down from 197.  GENERAL:  The patient is comfortable in no acute distress.  HEENT:  Conjunctiva is normal.  Oropharynx is clear.  NECK:  Supple.  No elevated jugular venous pressure.  No loud carotid  bruits.  LUNGS:  Clear without labored breathing.  CARDIAC:  Regular rate and rhythm.  Occasional ectopic beat.  No loud  systolic murmur or S3 gallop.  ABDOMEN:  Soft, nontender, and normoactive bowel sounds.  EXTREMITIES:  Exhibits no frank pitting edema.  Distal pulses are 2+.  SKIN:  Warm and dry.  MUSCULOSKELETAL:  No kyphosis noted.  NEUROPSYCHIATRIC:  The patient is alert and oriented x3.  Affect seems  appropriate.   IMPRESSION AND RECOMMENDATIONS:  Progressive dyspnea on exertion.  This  may well be multifactorial.  She  is not describing any marked  palpitations to suspect recurrent atrial arrhythmias leading to this.  There is some question as to whether she may not have an effective  treatment from CPAP and I have recommended that she discuss with Dr.  Maple [name] the possibility of a sleep study titration with her machine to  make sure she is getting effective therapy.  In addition to this, we  will screen her again for potential ischemia and also arrange an  echocardiogram to assess left ventricular function and pulmonary artery  pressures.  Assuming these studies are normal, we will plan to see her  back in 6 months.  Otherwise, I asked her to speak with [name] [name]  potentially about considering therapy for depression if in fact this  proves to be the case.  Otherwise her medical regimen seems reasonable  and she has good blood pressure control, actually with a decrease in her  weight.     [name] Sidle, MD  Electronically Signed     SGM/MedQ  DD: 10/27/2007  DT: 10/28/2007  Job #: [id number]   cc:   [name] Peri, MD

## 2010-08-12 NOTE — Assessment & Plan Note (Signed)
[facility]                          [name]   [name] [name], [name] [name]                   MRN:          [id number]  DATE:08/20/2008                            DOB:          09-23-1940    PRIMARY CARDIOLOGIST:  [name], MD   REASON FOR VISIT:  Three-month followup.   [name] [name] returns for scheduled followup.  No adjustment in  medications were made at time of her last visit.  Although she denies  any frank exertional chest pain, she does suggest symptoms of easy  fatigability and exertional dyspnea.  She has no known coronary artery  disease, history of paroxysmal atrial fibrillation, and normal left  ventricular function.  She is on chronic Coumadin, followed by [name] [name].   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 Edger House.  Conservative  management has been recommended, and she is on daily dosing of Celebrex.   [name] [name] denies any tachypalpitations.  However, she seems to feel  similar to how she has felt in the past, when she was in atrial  fibrillation.   An EKG in our office today indicates marked sinus bradycardia of  approximately 40 bpm with occasional PVCs.  No ischemic changes noted.   MEDICATIONS:  1. Coumadin 5 mg, as directed.  2. KCl 20 daily.  3. Lisinopril 40 daily.  4. Diltiazem ER 180 daily.  5. Hydrochlorothiazide 25 daily.  6. Klonopin 0.25 daily.  7. Prilosec 20 b.i.d.  8. Pulmicort Respules 0.5 daily.  9. Restasis 1 drop both eyes b.i.d.  10.Red yeast rice 1200 daily.  11.Omega-3 fish oil 1200 b.i.d.  12.Celebrex 200 daily.  13.CPAP.   PHYSICAL EXAMINATION:  VITAL SIGNS:  Blood pressure 128/51, pulse 49 and  regular, weight 188.9.  GENERAL:  A 70 year old female, moderately obese, sitting upright, no  distress.  HEENT:  Normocephalic, atraumatic.  NECK:  Palpable carotid pulses without bruits.  No JVD.  LUNGS:  Clear to auscultation in all fields.  HEART:  Irregularly irregular.  No significant murmurs.  ABDOMEN:  Soft, nontender.  EXTREMITIES:  No edema.  NEUROLOGIC:  Flat affect, but no focal deficit.   IMPRESSION:  1. Persistent fatigue.      a.     Question secondary to marked sinus bradycardia.  2. History of paroxysmal atrial fibrillation.      a.     Chronic Coumadin, followed by [name] [name].  3. Normal left ventricular function.  4. Hypertension, well controlled.  5. Obesity.  6. Dyslipidemia.  7. Arthritis.   PLAN:  1. Decrease diltiazem ER to 120 mg daily, in light of noted marked      bradycardia.  I suspect that this may be directly related to her      persistent fatigue.  We will also further evaluate with a 48-hour      Holter monitor, for assessment of diurnal variation and rule out of      profound bradycardia, as well as possible intermittent atrial      fibrillation.  2. Schedule early  clinic followup with myself and Dr. Diona Browner in 1      month, for review of Holter monitor results and continued close      monitoring of her symptoms.      Rozell Searing, PA-C  Electronically Signed      [name], MD  Electronically Signed   GS/MedQ  DD: 08/20/2008  DT: 08/21/2008  Job #: [id number]   cc:   Kirstie Peri, MD

## 2010-08-12 NOTE — Assessment & Plan Note (Signed)
[facility]                          [name]   [name] [name], [name] [name]                   MRN:          [id number]  DATE:11/11/2007                            DOB:          1940-08-09    CARDIOLOGIST:  [name] Sidle, MD   PRIMARY CARE PHYSICIAN:  [name] Peri, MD   REASON FOR VISIT:  Abnormal Cardiolite study.   HISTORY OF PRESENT ILLNESS:  [name] [name] is a 70 year old female  patient with a history of paroxysmal atrial fibrillation who recently  saw [name] [name] with complaints of fatigue and dyspnea with exertion.  She also a history of a tick bite back in the spring and a history of  bronchitis.  She was apparently treated with several rounds of  antibiotics.  She was set up for an echocardiogram and Cardiolite study.  The Cardiolite study was done on November 02, 2007.  This revealed  equivocal LV perfusion.  She had an anterior septal defect, variable  soft tissue attenuation; mild ischemia could not be excluded.  She also  had an increased TID ratio at 1.28.  Her EF was 69%.  Echocardiogram  revealed normal LV function, EF of 55-60%, mild TR/RV systolic pressure  of 37 mmHg.  She was asked to follow up today to review the findings on  her Cardiolite study.  She denies any chest pain.  She continues to note  dyspnea on exertion.  This seems to be mild.  She notes that it is  improving.  She is more concerned about her fatigue.  She denies  orthopnea, PND, or pedal edema.  She denies any syncope or palpitations.  It should be noted that she was in the emergency room last night with  suicidal ideations.  She actually did not have a plan.  She was recently  placed on Lexapro for severe depression.  She thinks she was having side  effects to this medication.   CURRENT MEDICATIONS:  Lisinopril 20 mg daily, Wellbutrin b.i.d., Klor-  Con 20 mEq daily, Diltiazem XR 240 mg daily, Restasis eye drops,  Warfarin as directed,  Pulmicort, Flaxseed oil, Red yeast rice, HCTZ 25  mg daily, Prilosec 20 mg b.i.d., Biotin Forte,  CPAP nightly, Diltiazem p.r.n.   ALLERGIES:  PENICILLIN and MACRODANTIN.   SOCIAL HISTORY:  She denies tobacco abuse.   FAMILY HISTORY:  Significant for CAD.  Her father had myocardial  infarction in his 45s.   REVIEW OF SYSTEMS:  Please see HPI.  Denies fevers, chills, cough,  melena, or hematochezia.  She denies any arthralgias.  She denies rash.  Rest of the review of systems are negative.   PHYSICAL EXAMINATION:  GENERAL:  She is a well-nourished, well-developed  female in no distress.  VITAL SIGNS:  Blood pressure is 128/64, pulse 68, weight 186.6 pounds.  HEENT:  Normal.  NECK:  Without JVD.  CARDIAC:  Normal S1 and S2.  Regular rate and rhythm.  LUNGS:  Clear to auscultation bilaterally.  ABDOMEN:  Soft and nontender.  EXTREMITIES:  Without edema.  Calves soft and nontender.  SKIN:  Warm and dry.  NEUROLOGIC:  She is alert and oriented x3.  Cranial II through XII  grossly intact.  VASCULAR:  Without carotid bruits bilaterally.   IMPRESSION AND PLAN:  1. Dyspnea with exertion and abnormal Cardiolite study as outlined      above.  I had a long discussion with the patient today regarding      further testing versus continued medical therapy and watchful      waiting.  She is in favor of continuing to treat her depression and      see how her symptoms improve.  This is certainly reasonable.  I      discussed this with [name] [name] who agreed.  We will see her back      in 4-6 weeks to reassess.  2. Paroxysmal atrial fibrillation.  She is stable and we will continue      on Coumadin.  3. Fatigue.  This may be related to her depression.  However, if this      does not improve over time then consideration could be given      towards thyroid testing with a TSH as well as possibly Lyme titers.      However, it seems that this may have been treated with doxycycline      in the  past.  Further workup of this can be per Dr. Sherryll [name].  4. Depression.  The patient seems to have had some side effects to her      Wellbutrin since starting it yesterday.  This should initially be      given once a day for few days and then up titrated to twice a day.      She will follow up further with Mental Health.   DISPOSITION:  The patient will be brought back in followup with Dr.  Diona [name] in the next 4-6 weeks.  If her symptoms of shortness of breath  should progress, we can certainly consider cardiac catheterization at  that time.      Tereso Newcomer, PA-C  Electronically Signed      [name] Sidle, MD  Electronically Signed   SW/MedQ  DD: 11/11/2007  DT: 11/12/2007  Job #: [id number]   cc:   [name] Peri, MD

## 2010-08-12 NOTE — Assessment & Plan Note (Signed)
[facility]                          [name]   LATRISHA, COIRO                   MRN:          [id number]  DATE:04/09/2008                            DOB:          1940/07/20    PRIMARY CARDIOLOGIST:  [name], MD   REASON FOR VISIT:  Scheduled followup.   HISTORY:  Ms. Shirkey returns to the clinic for scheduled followup,  since last seen here in September, by Dr. Diona Browner.  No medications  adjustments were made at that time.   Since then, Ms. Kerwin has enrolled in a water aerobics program, and  is doing well with no reported associated chest discomfort or  significant dyspnea.  In fact, she feels that she is doing quite well.   The patient does have a history of atrial fibrillation, and denies any  tachypalpitations.  She is on chronic Coumadin, closely followed by Dr.  Sherryll Burger.   CURRENT MEDICATIONS:  1. Coumadin 5 mg as directed.  2. Lisinopril 40 mg daily.  3. Diltiazem ER 180 mg daily.  4. KCl 20 mEq daily.  5. Hydrochlorothiazide 25 mg daily.  6. Klonopin 0.25 mg daily.  7. Prilosec 20 b.i.d.  8. Pulmicort Respules 0.5 mg daily.  9. Restasis 1 gtt o.u. b.i.d.  10.Flaxseed oil.  11.Red yeast rice.   PHYSICAL EXAMINATION:  VITAL SIGNS:  Blood pressure 127/72, pulse 49 and  regular.  Weight 191.8.  GENERAL:  A 70 year old female, moderately obese, sitting upright, in no  distress.  HEENT:  Normocephalic, atraumatic.  NECK:  Palpable carotid pulse without bruits; unable to assess JVD,  secondary to neck girth.  LUNGS:  Clear to auscultation in all fields.  HEART:  Regular rhythm with decreased rate.  No significant murmurs.  ABDOMEN:  Protuberant, nontender.  EXTREMITIES:  Palpable pulse without significant edema.  NEUROLOGIC:  No focal deficit.   IMPRESSION:  1. History of paroxysmal atrial fibrillation.      a.     On chronic Coumadin, followed by Dr. Sherryll Burger.  2. Asymptomatic sinus  bradycardia.  3. Preserved LVF.  4. History of pulmonary hypertension.  5. Dyslipidemia.  6. History of hypertension.  7. Asthma.  8. Obesity.  9. Depression.   PLAN:  1. Although the patient maintains a low-resting heart rate, she denies      any associated symptoms.  Given that she is doing so well,      therefore, I have elected to not make any medication adjustments at      this point in time.  However, we will continue monitoring this      closely and, if needed, down titrate her diltiazem to 120 mg daily.  2. The patient is not on a statin, although she is on red yeast rice.      She has no documented history of coronary artery disease, and does      not have diabetes mellitus.  However, she has significant      dyslipidemia by previous lipid profile last February, and I would      recommend a repeat study next month.  In  the interim, she is      instructed to decrease her saturated fat intake and increase her      activity level.  She will also begin Omega-3 fish oil supplements.  3. Schedule return clinic follow up with myself and Dr. Diona Browner in 3      months.      Rozell Searing, PA-C  Electronically Signed      [name], MD  Electronically Signed   GS/MedQ  DD: 04/09/2008  DT: 04/10/2008  Job #: [id number]   cc:   Kirstie Peri, MD

## 2010-08-12 NOTE — Assessment & Plan Note (Signed)
[facility]                             PULMONARY OFFICE NOTE   [name] [name], [name] [name]                   MRN:          [id number]  DATE:11/22/2006                            DOB:          04/20/1940    PROBLEM LIST:  1. Asthmatic bronchitis.  2. Obstructive sleep apnea.  3. Esophageal reflux.  4. Atrial fibrillation/Coumadin [name], M.D.).   HISTORY:  Since last year her mother died and there has been a lot of  stress.  ER visits for chest pain and she had a stress test suggesting  eventually that it was not cardiac discomfort, but possibly reflux.  Mouth a little sore sometimes.  When she walks she says she feels tight  in the chest and throat.  Pruritic rash or insect bites on her feet in a  sock distribution over the past month.  Apparently quite a few people  have this pattern near her home.  She got a steroid injection and  steroid dose pack 4 weeks ago, now on Prilosec with head of bed  elevated.   MEDICATIONS:  1. Allergy vaccine at 1:10 with no problems.  2. CPAP at 10 CWP.  3. Potassium 20 mEq.  4. Nexium 40 mg.  5. Coumadin.  6. Nebulizer with Pulmicort 0.5 mg at least once daily.  7. Biotin 5 mg.  8. Flax seed oil.  9. Red yeast rice.  10.Prevacid.  11.Astelin Hydromet syrup p.r.n.  12.She does have an Epipen.   ALLERGIES:  PENICILLIN, ADVAIR, SEREVENT.  She avoids ANTIHISTAMINES and  STIMULANTS.   OBJECTIVE:  VITAL SIGNS:  Weight 194 pounds, blood pressure 132/72,  pulse 46, room air saturation 99%.  EXTREMITIES:  There is a punctate rash in a sock distribution looking  very much like insect bites.  Otherwise she seems comfortable.  CHEST:  Clear.   IMPRESSION:  1. Insect bites.  2. Coronary artery disease.  3. Esophageal reflux.  4. Obstructive sleep apnea on CPAP now at 11 CWP.   PLAN:  1. Caladryl for the buck bites.  2. Sample Xyzal 5 mg h.s. p.r.n.  3. Schedule return in 6 months, earlier  p.r.n.     Clinton D. Maple Hudson, MD, Ton[facility] Bollman, FACP  Electronically Signed    CDY/MedQ  DD: 11/28/2006  DT: 11/28/2006  Job #: [id number]   cc:   Kirstie Peri, MD

## 2010-08-12 NOTE — Assessment & Plan Note (Signed)
[facility]                          [name] NOTE   ASHAUNA, BERTHOLF                   MRN:          [id number]  DATE:02/03/2007                            DOB:          [birth year]    PRIMARY CARE PHYSICIAN:  Dr. Kirstie Peri.   REASON FOR VISIT:  Followup paroxysmal atrial fibrillation.   HISTORY OF PRESENT ILLNESS:  Ms. Brossart comes in for a 37-month visit.  She saw Mr. Shara Blazing at the last visit in April.  She remains in sinus  rhythm today by electrocardiogram showing sinus bradycardia at 56 beats  per minute.  Her predominant complaint is of a general sense of fatigue.  She has actually gone to a more formal exercise regimen at the [facility] and  has been trying to build herself up, but still senses fatigue with  activity.  She has had no chest pain.  She also, in fact, just recently  had an ischemic evaluation with a Cardiolite in August of this year  showing normal ejection fraction and no evidence of ischemia.  She had a  TSH just recently obtained that was normal at 1.85 as well.  Today, I  reviewed the patient's medications and noted an increase in her  Diltiazem XR to 240 mg daily.  We talked about perhaps cutting this back  to 180 to see if this made any difference.  She is also continuing on  CPAP and reports sleeping 10 hours at night, but still feels fatigued in  the morning.  I wonder if, perhaps, she needs a followup titration study  to make sure this is effective therapy.   ALLERGIES:  PENICILLIN and MACRODANTIN.   PRESENT MEDICATIONS:  1. Coumadin as directed by [facility].  2. Lisinopril 40 mg p.o. daily.  3. Diltiazem XR 240 mg p.o. daily.  4. Flonase 2 sprays daily.  5. Pulmicort.  6. Flax seed oil.  7. Red yeast rice extract.  8. Glucosamine sulfate.  9. Hydrochlorothiazide 25 mg p.o. daily.  10.Prilosec 20 mg p.o. b.i.d.  11.She also has short-acting Diltiazem in 30 mg tablets.   REVIEW OF  SYSTEMS:  As described in the history of present illness.   EXAMINATION:  Blood pressure 124/67, heart rate is 55, weight 197  pounds.  The patient is comfortable and in no acute distress.  Overweight.  HEENT:  Conjunctivae normal.  Oropharynx clear.  NECK:  Supple.  No elevated jugular venous pressure or loud bruits.  LUNGS:  Clear without labored breathing.  CARDIAC:  Regular rate and rhythm.  No S3 gallop or pericardial rub.  ABDOMEN:  Soft and nontender.  EXTREMITIES:  No pitting edema.  Distal pulses are 2+.  SKIN:  Warm and dry.  MUSCULOSKELETAL:  No kyphosis noted.  NEURO/PSYCH:  The patient is alert and oriented x3.  Affect is normal.   IMPRESSION/RECOMMENDATIONS:  Paroxysmal atrial fibrillation, presently  in normal sinus rhythm.  The patient will continue on Coumadin and I  gave her a prescription for decreased dose Diltiazem XR 180 mg p.o.  daily to see if this helps at all with her  fatigue.  She had a recent  reassuring ischemic evaluation and her TSH is normal.  If, through  exercise and a change in her medicines, she does not notice any  improvement, I have asked her to perhaps discuss a followup titration  study with Dr. Maple Hudson to make sure that her CPAP is effective.   Otherwise, I would plan to see her back over the next 6 months.     [name], MD  Electronically Signed    SGM/MedQ  DD: 02/03/2007  DT: 02/03/2007  Job #: [id number]   cc:   Kirstie Peri, MD

## 2010-08-12 NOTE — Assessment & Plan Note (Signed)
[facility]                          [name] NOTE   BRAZIL, VOYTKO                   MRN:          [id number]  DATE:10/24/2008                            DOB:          [birth year]    PRIMARY CARDIOLOGIST:  [name], MD   REASON FOR VISIT:  Scheduled followup.   Ms. Kohls returns to the clinic for ongoing monitoring and management  of paroxysmal atrial fibrillation and tachybrady syndrome.  She is on  chronic Coumadin, followed by Dr. Sherryll Burger.   Since her last visit, her Cardizem has been decreased to the current  dose of 30 mg b.i.d., in a continued attempt at improving her persistent  bradycardia, and possible associated longstanding fatigue.  When I last  saw her in May, I cut back her diltiazem ER to 120 mg daily, following  presentation with a heart rate of 49.  I also referred her for a 48-hour  Holter monitor:  This indicated heart rate ranging from 35 to 112 bpm,  with an average of 53.  Isolated PVCs and a single run of 3 beat NSVT  was noted.  There were also frequent, brief runs of probable atrial  tachycardia of approximately 100 bpm.   Clinically, she suggests some improvement in her fatigue, following  recent decrease in diltiazem to 30 mg b.i.d., on July 4.  Her documented  pulse readings, however, show consistently low rates, as low as 46, with  a high of 64.  She also indicates that her lisinopril was increased back  up to 40 mg daily; serial blood pressure readings show improvement in  this, as well, from systolic readings as high as 195, one month ago.   EKG in our office today indicates marked sinus bradycardia at 45 bpm  with LAD and no ischemic changes.   REVIEW OF SYSTEMS:  As outlined above, otherwise reviewed and negative.   CURRENT MEDICATIONS:  1. Diltiazem 30 mg b.i.d.  2. Coumadin 5 mg, per Dr. Sherryll Burger.  3. KCL 20 daily.  4. Hydrochlorothiazide 25 daily.  5. Prilosec 20 b.i.d.  6.  Klonopin 0.25 daily.  7. Omega-3 fish oil 1200 b.i.d.  8. Celebrex 200 daily.  9. Lisinopril 40 daily.   PHYSICAL EXAMINATION:  VITAL SIGNS:  Blood pressure 132/72, pulse 47,  regular, weight 182 (down 1).  GENERAL:  A 70 year old female, obese, sitting upright, no distress.  HEENT:  Normocephalic, atraumatic.  NECK:  Palpable carotid pulse without bruits.  LUNGS:  Clear to auscultation in all fields.  HEART:  Regular rate and rhythm.  No significant murmurs.  ABDOMEN:  Protuberant, nontender.  EXTREMITIES:  No significant edema.  NEURO:  No focal deficits.   IMPRESSION:  1. Persistent fatigue.  1a.  Question secondary to persistent sinus bradycardia.  1. Paroxysmal atrial fibrillation.  2a.  Maintaining sinus bradycardia.  1. Chronic Coumadin.  3a.  Followed by Dr. Sherryll Burger.  1. Normal LVF.  2. Hypertension.  3. Dyslipidemia.  4. Obesity.   PLAN:  1. Diltiazem will be discontinued, and we will initiate treatment with      pindolol 5  mg b.i.d.  This has ISA activity and, hopefully, will      help ameliorate her persistent bradycardia, as a result.  If she      develops more frequent atrial fibrillation with uncontrolled rates,      then we may need to consider proceeding with a permanent pacemaker,      followed by aggressive medical therapy for rate control.  The      patient is inclined to proceed with this current plan.  2. The patient is to continue using 30 mg diltiazem, for p.r.n.      treatment of tachypalpitations.  3. Return clinic visit with the staff RN for followup blood      pressure/pulse check in 1 week.  4. Schedule return clinic followup with myself and Dr. Diona Browner in 1      month.      Rozell Searing, PA-C  Electronically Signed      [name], MD  Electronically Signed   GS/MedQ  DD: 10/24/2008  DT: 10/25/2008  Job #: [id number]   cc:   Kirstie Peri, MD

## 2010-08-15 NOTE — Assessment & Plan Note (Signed)
[name]                               PULMONARY OFFICE NOTE   [name], [name] [name]                   MRN:          [id number]  DATE:11/25/2005                            DOB:          [birth year]    PROBLEMS:  1. Asthmatic bronchitis.  2. Obstructive sleep apnea.  3. Esophageal reflux.  4. Atrial fibrillation/Coumadin.   HISTORY:  She has stayed in much of the summer doing little mowing but she  has been out more in the slightly cooler mornings recently.  She is  beginning to have increased nasal congestion, itching and sneezing.  She  continues allergy vaccine at 1 to 10 giving her own injections.  I talked with her about efficacy, goals, risks, and options today.  Her eyes  and nose are itching some.  Pulmicort has been sufficient to control her  chest and she very rarely has needed her Xopenex 0.63 by nebulizer.  CPAP  continues comfortably at 10 CWP but she tells me she skipped it this week  because she was having insomnia related to worrying about her family.   MEDICATIONS:  1. Allergy vaccine at 1 to 10.  2. CPAP at 10 CWP.  3. Potassium 20 mEq.  4. Lisinopril 20 mg.  5. Diltiazem ER 120 mg.  6. Warfarin.  7. Flonase.  8. For her nebulizer she has Pulmicort 0.5 used b.i.d.  9. Xopenex 0.63 used b.i.d. to q.i.d. when needed but very rarely.  10.Singulair.  11.Prevacid.  12.Astelin.  13.Diclofenac.   DRUG INTOLERANCE:  1. PENICILLIN.  2. ADVAIR.  3. SEREVENT.  4. She avoids stimulant medications.   PHYSICAL EXAMINATION:  VITAL SIGNS: Weight 190 pounds.  BP 90/52.  Pulse  regular at 51.  Room air saturation 99%.  GENERAL:  She is overweight but looks unchanged.  HEENT:  There is moderate nasal congestion with clear secretion. No visible  polyps.  Conjunctivae are clear. There is no periorbital puffiness.  No post  nasal drainage or pharyngeal erythema.  LUNGS:  Clear.  HEART:  Heart sounds regular without murmur.  EXTREMITIES:  No edema.   IMPRESSION:  1. Allergic rhinitis with seasonal exacerbation.  2. Obstructive sleep apnea with less than ideal compliance with CPAP.  3. Stable, mild intermittent asthma.   PLAN:  1. Nebulizer with Neo-Synephrine.  2. Educational discussion about CPAP again.  3. Try over-the-counter medications for sleep when needed as a start.  4. Sample Astelin once each nostril b.i.d. p.r.n.  5. Sample Optivar one drop each eye b.i.d. p.r.n.  6. Schedule return in six months, earlier p.r.n.                                   Clinton D. Maple Hudson, MD, [facility], FACP   CDY/MedQ  DD:  11/25/2005  DT:  11/26/2005  Job #:  [id number]   cc:   Weyman Pedro, M.D.

## 2010-08-15 NOTE — Assessment & Plan Note (Signed)
[facility]                          [name] NOTE   [name], [name]                   MRN:          [id number]  DATE:07/09/2006                            DOB:          1941-03-26    PRIMARY CARDIOLOGIST:  Dr. Simona Huh.   REASON FOR VISIT:  Post-hospital followup.   [name] [name] is a very pleasant 70 year old female with a history of  paroxysmal atrial fibrillation on chronic Coumadin, who was recently  briefly hospitalized here at [facility] for recurrent paroxysmal atrial  fibrillation.   Medications were adjusted with up-titration of Cardizem to 240 daily,  following consultation between Drs. Austin and [place], and the patient  now presents in followup.  Since the adjustments of the medications, the  patient reports having had only 1 breakthrough episode of PAF, which was  shortly after her release from the hospital, but none since then.  Followup EKG in Dr. Magnus Ivan office, as well as an electrocardiogram  done here today, show maintenance of normal sinus rhythm in the 50 BPM  range.   CURRENT MEDICATIONS:  1. Cardizem 240 daily.  2. Coumadin as directed.  3. Lisinopril 20 daily.  4. KCl 20 daily.  5. Flonase.  6. Pulmicort.  7. Prevacid.   PHYSICAL EXAM:  Blood pressure 133/70, pulse 53 and regular.  Weight  194.  GENERAL:  A 70 year old female, obese, sitting upright in no distress.  HEENT:  Normocephalic, atraumatic.  NECK:  Palpable bilateral carotid pulses without bruits; unable to  assess JVD secondary to neck girth.  LUNGS:  Clear to auscultation in all fields.  HEART:  Regular rate and rhythm (S1, S2).  No significant murmurs.  ABDOMEN:  Protuberant, nontender.  EXTREMITIES:  Trace pedal edema.  NEURO:  No focal deficit.   IMPRESSION:  1. Recurrent paroxysmal atrial fibrillation.      a.     Converted to normal sinus rhythm on intravenous diltiazem.      b.     Currently maintaining sinus  bradycardia.  2. Chronic Coumadin.      a.     Followed by Dr. Eliberto Ivory.  3. Normal left ventricular function.      a.     By 2D echo in 2003.  4. Chronic obstructive pulmonary disease/asthma.  5. Sleep apnea, on continuous positive airway pressure.  6. Hypertension.  7. Obesity.   PLAN:  1. A 2D echo for reassessment of left ventricular function.  2. Continue current medication regimen, including use of diltiazem 30      mg p.r.n. for breakthrough tachy palpitations.  3. Check TSH level if none done recently.  4. The patient is instructed to refrain from caffeinated beverages.  5. Schedule return clinic follow up with myself and Dr. Diona Browner in 6      months.      Rozell Searing, PA-C  Electronically Signed      [name], MD  Electronically Signed   GS/MedQ  DD: 07/09/2006  DT: 07/09/2006  Job #: [id number]   cc:   Weyman Pedro

## 2010-08-15 NOTE — Assessment & Plan Note (Signed)
[name]                             PULMONARY OFFICE NOTE   [name] [name]                   MRN:          [id number]  DATE:05/24/2006                            DOB:          April 21, 1940    PROBLEMS:  1. Asthmatic bronchitis.  2. Obstructive sleep apnea.  3. Esophageal reflux.  4. Atrial fibrillation/Coumadin.   HISTORY:  Had been at hospital again with a recurrence of atrial  fibrillation, but is doing better now.  Lives with mother, who has  cancer.  Continues with allergy vaccine without problems.  We discussed  risk and benefit goals of allergy vaccine therapy, including issues of  administration outside of a medical office, anaphylaxis and epinephrine.  She has an EpiPen prescription which we have reviewed again with her.  Main complaint today is gradually increasing tiredness or daytime  sleepiness.  She feels she wants to sleep 10 or 12 hours a day, although  she thinks she sleeps well at night.  She does notice that she tosses  and turns, has some muscle aches and vivid dreams.  Part of the problem  is that she is trying to listen out for her mother all night long.  Her  mother has to get up frequently for the bathroom.  Occasional day lets  her take a 2-hour nap.  She is aware of reflux.   MEDICATIONS:  1. Allergy vaccine at 1:10.  2. CPAP at 10 CWP.  3. Potassium 20 mEq.  4. Lisinopril 20 mg.  5. Diltiazem ER 120 mg.  6. Warfarin.  7. Pulmicort by nebulizer b.i.d. p.r.n.  8. Vitamin and mineral supplements.  9. Singulair.  10.Prevacid 30 mg.  11.Astelin.  12.Xopenex is used at 0.63 by nebulizer t.i.d. p.r.n. very      occasionally.  13.EpiPen is available but never needed.   ALLERGIES:  DRUG INTOLERANCE TO PENICILLIN, ADVAIR, SEREVENT.  SHE  AVOIDS ANTIHISTAMINES BECAUSE OF DRY EYES AND AVOIDS STIMULANT  MEDICATIONS BECAUSE OF HER HEART RHYTHM.   OBJECTIVE:  Weight 192 pounds, BP 122/64, pulse regular 53, room  air  saturation 99%.  She is moderately obese.  There is mild nasal congestion, but her lungs sound clear.  Pulse feels regular to me without murmur.   IMPRESSION:  Allergic rhinitis is fairly well controlled, at least now  in off season.  Reflux is of concern, and management was discussed.  Complaint of daytime sleepiness seems related to sleep that is not as  good as she thinks it is because of her need to maintain vigilance for  her mother during the night.  She does not want anything too heavily  sedating, but hopes we could give her something to help her relax a  little bit better at night.   PLAN:  1. Try Tylenol at bedtime, may also try doxepin 10 mg at bedtime with      discussion.  2. We are going to let [facility] try increasing her CPAP      from 10 to 11 CWP.  3. Schedule return in 6 months, but earlier p.r.n.  4. EpiPen  was refilled.     Clinton D. Maple Hudson, MD, [facility], FACP  Electronically Signed    CDY/MedQ  DD: 05/26/2006  DT: 05/26/2006  Job #: [id number]   cc:   Weyman Pedro

## 2010-08-15 NOTE — Assessment & Plan Note (Signed)
[facility]                            [name] NOTE   [name] [name], [name] [name]                   MRN:          [id number]  DATE:12/14/2005                            DOB:          1940-12-09    PRIMARY CARDIOLOGIST:  [name] Sidle, MD   REASON FOR OFFICE VISIT:  Scheduled annual follow-up.   [name] is a very pleasant 70 year old female with history of  paroxysmal atrial fibrillation on chronic Coumadin, with no known history of  coronary artery disease, who now presents for a scheduled annual follow-up.   Since last seen here in the clinic in August 2006 by Dr. Diona [name], she has  had no interim development of tachypalpitations.  In fact, she cites that  her PAF was documented on only 3 discrete occasions and she feels that this  may have been attributed to some asthma medication, which she is no longer  on.   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  The patient herself is single, does not have  children of her own, but is very close to her mother, who sees her as the  only one that she trusts.   Despite this, the patient feels that she has done extremely well.  She has  no remote or recent history of exertional chest discomfort.  She is able to  climb a flight of stairs with no associated symptoms.   CURRENT MEDICATIONS:  1. Flonase nasal spray.  2. Azmacort b.i.d.  3. Coumadin as directed.  4. K-Dur 20 mEq daily.  5. Lisinopril 20 mg daily.  6. Astelin p.r.n.  7. Cardizem CD 120 mg daily.  8. Prevacid.  9. Pulmicort p.r.n.  10.Singulair 10 mg daily.   PHYSICAL EXAMINATION:  VITAL SIGNS:  Blood pressure 102/60, pulse 60 and  regular, weight 190.  GENERAL:  A 70 year old female, obese, sitting upright in no apparent  distress.  NECK:  Palpable carotid pulse without  bruits.  LUNGS:  Clear to auscultation in all fields.  CARDIAC:  Heart regular rate and rhythm (S1, S2), no significant murmurs.  ABDOMEN:  Protuberant, nontender.  EXTREMITIES:  Palpable distal pulses with no significant edema.  NEUROLOGIC:  No focal deficit.   IMPRESSION:  1. History of paroxysmal atrial fibrillation.      a.     Maintaining normal sinus rhythm.  2. Chronic Coumadin.      a.     Followed by Dr. Weyman [name].  3. History of normal left ventricular function.      a.     Echocardiogram in 2003.  4. Chronic obstructive pulmonary disease/asthma.  5. Sleep apnea/on CPAP.  6. Obesity.  7. History of hypertension, well-controlled.   PLAN:  Continue current medication regimen.  Resume cardiology follow-up  with Dr. Simona [name] in one year.  [name] Serpe, PA-C                                [name] Sidle, MD   GS/MedQ  DD:  12/14/2005  DT:  12/15/2005  Job #:  [id number]   cc:   [name] [name], M.D.

## 2010-08-17 ENCOUNTER — Other Ambulatory Visit: Payer: Self-pay | Admitting: Cardiology

## 2010-08-18 NOTE — Telephone Encounter (Signed)
[place] pt. 

## 2010-09-23 ENCOUNTER — Encounter: Payer: Self-pay | Admitting: Cardiology

## 2011-02-23 ENCOUNTER — Other Ambulatory Visit: Payer: Self-pay | Admitting: Cardiology

## 2011-03-02 ENCOUNTER — Encounter: Payer: Self-pay | Admitting: Cardiology

## 2011-03-03 ENCOUNTER — Encounter: Payer: Self-pay | Admitting: *Deleted

## 2011-03-03 ENCOUNTER — Encounter: Payer: Self-pay | Admitting: Cardiology

## 2011-03-03 ENCOUNTER — Ambulatory Visit (INDEPENDENT_AMBULATORY_CARE_PROVIDER_SITE_OTHER): Payer: Medicare Other | Admitting: Cardiology

## 2011-03-03 ENCOUNTER — Telehealth: Payer: Self-pay | Admitting: *Deleted

## 2011-03-03 ENCOUNTER — Other Ambulatory Visit: Payer: Self-pay | Admitting: Cardiology

## 2011-03-03 VITALS — BP 128/71 | HR 55 | Resp 16 | Ht 64.0 in | Wt 194.0 lb

## 2011-03-03 DIAGNOSIS — R0602 Shortness of breath: Secondary | ICD-10-CM

## 2011-03-03 DIAGNOSIS — I10 Essential (primary) hypertension: Secondary | ICD-10-CM

## 2011-03-03 DIAGNOSIS — I495 Sick sinus syndrome: Secondary | ICD-10-CM

## 2011-03-03 DIAGNOSIS — G4733 Obstructive sleep apnea (adult) (pediatric): Secondary | ICD-10-CM

## 2011-03-03 DIAGNOSIS — I4891 Unspecified atrial fibrillation: Secondary | ICD-10-CM

## 2011-03-03 NOTE — Patient Instructions (Signed)
   Lexiscan cardiolite stress test  Take Pindolol twice a day If the results of your test are normal or stable, you will receive a letter.  If they are abnormal, the nurse will contact you by phone. Follow up in  1 month - see above

## 2011-03-03 NOTE — Assessment & Plan Note (Signed)
Paroxysmal, more frequent and symptomatic lately. She continues on Coumadin, pindolol, with as needed use of short-acting diltiazem. Plan is to proceed with followup ischemic evaluation, as we will likely need to consider antiarrhythmic therapy for symptom control. Followup is arranged.

## 2011-03-03 NOTE — Telephone Encounter (Signed)
No precert required for [employer]

## 2011-03-03 NOTE — Assessment & Plan Note (Signed)
This has limited medication adjustment somewhat. She has tolerated pindolol.

## 2011-03-03 NOTE — Assessment & Plan Note (Signed)
Could be related to weight gain as well as intermittent atrial fibrillation. Also need to exclude underlying ischemia. Stress testing in 2009 was equivocal.

## 2011-03-03 NOTE — Telephone Encounter (Signed)
Lexiscan cardiolite stress test Scheduled for 03-06-2011 @ [facility]  Checking percert

## 2011-03-03 NOTE — Assessment & Plan Note (Signed)
Patient reports compliance with CPAP 

## 2011-03-03 NOTE — Assessment & Plan Note (Signed)
Blood pressure is well-controlled today. 

## 2011-03-03 NOTE — Progress Notes (Signed)
Clinical Summary Ms. Gibbon is a 69 y.o.female presenting for followup. She was seen back in January. She has had more episodes of symptomatic atrial fibrillation, in fact at least 3 episodes the last one to 2 weeks. Some of the lasted for several hours. She uses p.r.n. short-acting diltiazem with effect each time.  I note that she has been taking pindolol, both pills one time in the evening rather than b.i.d.  She reports being short of breath with activity, has gained a significant amount of weight, still tries to go to the swimming pool. Last ischemic workup was in 2009.  Followup ECG shows that she is in sinus rhythm today.   Allergies  Allergen Reactions  . Advair Hfa   . Amoxicillin     REACTION: rash  . Bupropion Hcl   . Escitalopram Oxalate   . Lisinopril   . Penicillins   . Tetanus Toxoid     Medication list reviewed.  Past Medical History  Diagnosis Date  . Allergic rhinitis   . 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 Hyperlipidemia   . Essential hypertension, benign   . Anxiety   . Depression   . Lumbar disc disease   . OSA (obstructive sleep apnea)     Past Surgical History  Procedure Date  . Cataract extraction   . Lasik     Both eyes  . 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 Anterior fusion lumbar spine     Family History  Problem Relation Age of Onset  . Cancer Mother   . Heart attack Father     Social History Ms. Haseley reports that she has never smoked. She does not have any smokeless tobacco history on file. Ms. Wirsing reports that she does not drink alcohol.  Review of Systems No bleeding problems, tolerates Coumadin. No dizziness or syncope. Still has orthopedic functional limitations despite joint replacements, although is doing better than she has in the last year.Otherwise negative.  Physical Examination Filed Vitals:   03/03/11 1346  BP: 128/71  Pulse: 55  Resp: 16   Overweight woman in no  acute distress.  HEENT: Conjunctiva and lids normal, oropharynx with moist mucosa.  Neck: Supple, no elevated JVP or bruits.  Lungs: Clear to auscultation, diminished, nonlabored.  Cardiac: Regular rate and rhythm, distant heart sounds, no significant systolic murmur.  Abdomen: Soft, nontender, bowel sounds present.  Extremities: No pitting edema.  Skin: Warm and dry.  Musculoskeletal: No kyphosis.  Neuropsychiatric: Alert and oriented x3, affect appropriate.   ECG Sinus bradycardia at 59, low voltage.   Problem List and Plan

## 2011-03-06 DIAGNOSIS — R079 Chest pain, unspecified: Secondary | ICD-10-CM

## 2011-03-09 ENCOUNTER — Telehealth: Payer: Self-pay | Admitting: Cardiology

## 2011-03-09 NOTE — Telephone Encounter (Signed)
Pt states before she saw Dr. Diona Browner her heart was racing and BP was going up. She calls this going into A.Fib. She states she has had several episodes lately. She had episodes Friday after the stress test, Sunday, and this morning. She states since pindolol was changed to two times a day her BP is not shooting up but HR is still racing. She uses diltiazem 30 mg for racing and this controls HR. She states before seeing Dr. Diona Browner it took about 7 hours before it would work but now it's taking about 2 hours before it works. Pt states symptoms are fatigue and chest pain, which she isn't sure is from heart racing vs rehab. She states she hasn't been doing as much rehab since she's having these symptoms more frequently. 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 She thinks she will be okay since she doesn't have any major symptoms with it. Pt notified if she has acute symptoms she should not play and should take diltiazem asap. If heart is racing without symptoms, pt can play at church and take diltiazem as soon as possible. Pt verbalized understanding.   She would like to know if she is ok to keep Jan appt or if she needs to be seen sooner?

## 2011-03-09 NOTE — Telephone Encounter (Signed)
[name] [name] called and states that since last Friday she continues to go in and out of A Fib. She stated that after She takes her medication her heart rate will slow down. She is concerned about this up coming weekend. 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 Should she continue with the production and then after it is over take her medication Or what. How will this affect her heart?

## 2011-03-09 NOTE — Telephone Encounter (Signed)
I wonder if she could tolerate taking Cardizem 30 mg BID on a regular basis. She has had problems with symptomatic bradycardia in the past. Might be worth considering.

## 2011-03-09 NOTE — Telephone Encounter (Signed)
Left message to call back on voice mail

## 2011-03-10 ENCOUNTER — Telehealth: Payer: Self-pay | Admitting: *Deleted

## 2011-03-10 NOTE — Telephone Encounter (Signed)
Left message to call back on voicemail regarding results.  

## 2011-03-10 NOTE — Telephone Encounter (Signed)
Message copied by Arlyss Gandy on Tue Mar 10, 2011  2:49 PM ------      Message from: MCDOWELL, Illene Bolus      Created: Mon Mar 09, 2011  3:40 PM       No ischemia with normal LVEF. We will likely consider Flecainide for control of atrial fibrillation, and she should already have office followup arranged.

## 2011-03-10 NOTE — Telephone Encounter (Signed)
Pt notified and verbalized understanding. Pt will wait a few days to determine if she feels she needs to take Cardizem 30 mg two times a day.

## 2011-03-11 NOTE — Telephone Encounter (Signed)
Pt notified of results and verbalized understanding  

## 2011-03-16 ENCOUNTER — Other Ambulatory Visit: Payer: Self-pay | Admitting: Cardiology

## 2011-04-03 ENCOUNTER — Encounter: Payer: Self-pay | Admitting: Cardiology

## 2011-04-03 ENCOUNTER — Ambulatory Visit (INDEPENDENT_AMBULATORY_CARE_PROVIDER_SITE_OTHER): Payer: Medicare Other | Admitting: Cardiology

## 2011-04-03 VITALS — BP 133/71 | HR 56 | Ht 64.0 in | Wt 197.0 lb

## 2011-04-03 DIAGNOSIS — I480 Paroxysmal atrial fibrillation: Secondary | ICD-10-CM

## 2011-04-03 DIAGNOSIS — I4891 Unspecified atrial fibrillation: Secondary | ICD-10-CM

## 2011-04-03 DIAGNOSIS — I10 Essential (primary) hypertension: Secondary | ICD-10-CM

## 2011-04-03 NOTE — Assessment & Plan Note (Signed)
At this point seems to be more stable with fewer palpitations. She is not interested in initiating any antiarrhythmics. We could ultimately consider Flecainide if needed. She will continue on Coumadin and current rate control strategy. Followup arranged.

## 2011-04-03 NOTE — Progress Notes (Signed)
   Clinical Summary Ms. Tanney is a 71 y.o.female presenting for followup. She was seen in early December, at that time experiencing progressive palpitations with history of paroxysmal atrial fibrillation. We discussed the possibility of antiarrhythmic therapy at that time.  A Cardiolite was arranged demonstrating no definite ischemia with an LVEF of 73%. We reviewed this today.  Fortunately, she reports much fewer palpitations. At this point she is more interested in observation rather than initiating any new medications. I reviewed her home blood pressure records. Medicines have been modified by Dr. Sherryll Burger. She is not reporting any chest pain or bleeding problems.  Allergies and medication list reviewed.  Past Medical History  Diagnosis Date  . Allergic rhinitis   . 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 Hyperlipidemia   . Essential hypertension, benign   . Anxiety   . Depression   . Lumbar disc disease   . OSA (obstructive sleep apnea)     Social History Ms. Helgeson reports that she has never smoked. She has never used smokeless tobacco. Ms. Engh reports that she does not drink alcohol.  Review of Systems Some arthritic back problems. She has been trying to swim regularly and do some walking. No dizziness or syncope. Was negative.  Physical Examination Filed Vitals:   04/03/11 1354  BP: 133/71  Pulse: 56    Overweight woman in no acute distress.  HEENT: Conjunctiva and lids normal, oropharynx with moist mucosa.  Neck: Supple, no elevated JVP or bruits.  Lungs: Clear to auscultation, diminished, nonlabored.  Cardiac: Regular rate and rhythm, distant heart sounds, no significant systolic murmur.  Abdomen: Soft, nontender, bowel sounds present.  Extremities: No pitting edema.    ECG Electronic copy reviewed.  Problem List and Plan

## 2011-04-03 NOTE — Patient Instructions (Signed)
Your physician wants you to follow-up in: 6 months. You will receive a reminder letter in the mail one-two months in advance. If you don't receive a letter, please call our office to schedule the follow-up appointment. Your physician recommends that you continue on your current medications as directed. Please refer to the Current Medication list given to you today. 

## 2011-04-03 NOTE — Assessment & Plan Note (Signed)
Home blood pressure records reviewed. Systolics have been up, better today. Medication adjustments as per Dr. Sherryll Burger. Also discussed diet and exercise.

## 2011-04-06 ENCOUNTER — Encounter: Payer: Self-pay | Admitting: Cardiology

## 2011-04-16 ENCOUNTER — Telehealth: Payer: Self-pay | Admitting: *Deleted

## 2011-04-16 NOTE — Telephone Encounter (Signed)
Pt states she has been in A.Fib since 2 am. She states this is the longest she has been it. She states she wakes up and feels like something is wrong. When she took her BP, she could tell it was irregular. She states she has felt her heart beating hard today. HR at 1245 was 122. It has been up to 146 around 5am. She can take another diltiazem in about 2 hours. She states she is on a healthy diet and has been drinking a lot of water. She states she has been going to the bathroom a lot b/c of this.   Discussed with Dr. Kirke Corin, who recommends pt take diltiazem 30 mg every 6 hours and see Dr. Diona Browner for early follow up. Pt has been taking her Coumadin as directed. Pt will see Dr. Diona Browner on 05/01/11. She will go to the ER for development of dizziness, etc. Pt is agreeable with this plan. She states she just has never been in A.Fib for this long and didn't know what to do.

## 2011-04-17 DIAGNOSIS — I4891 Unspecified atrial fibrillation: Secondary | ICD-10-CM

## 2011-04-22 ENCOUNTER — Telehealth: Payer: Self-pay | Admitting: *Deleted

## 2011-04-22 NOTE — Telephone Encounter (Signed)
Pt was admitted to [facility] overnight 04/17/2011 d/t A.Fib w/RVR. Pt was d/c'd on bisoprolol 5 mg daily and diltiazem 120 mg daily. Her HR dropped on Sunday and called Dr. Sherril Croon. He told her not to take one of her meds. On Monday, she took everything as prescribed. Today, her HR as dropped as low as 39. She is easily fatigued and is having to lay down to rest periodically. She denies dizziness, syncope or other problems, other than fatigue.

## 2011-04-22 NOTE — Telephone Encounter (Signed)
Recommend DCing bisoprolol, and continuing Cardizem 120 mg daily. Will reassess at OV.

## 2011-04-22 NOTE — Telephone Encounter (Signed)
Pt left message on voicemail asking for return call regarding low HR. She was recently admitted overnight d/t A.Fib. She states the new medicine she is on is slowing her HR down too much. It is 39 right now. She is concerned about this and is feeling really tired.

## 2011-04-23 NOTE — Telephone Encounter (Signed)
Left message to call back on voice mail

## 2011-04-23 NOTE — Telephone Encounter (Signed)
Pt notified and verbalized understanding.

## 2011-05-01 ENCOUNTER — Ambulatory Visit (INDEPENDENT_AMBULATORY_CARE_PROVIDER_SITE_OTHER): Payer: Medicare Other | Admitting: Cardiology

## 2011-05-01 ENCOUNTER — Encounter: Payer: Self-pay | Admitting: Cardiology

## 2011-05-01 VITALS — BP 123/58 | HR 60 | Ht 63.5 in | Wt 200.0 lb

## 2011-05-01 DIAGNOSIS — I4891 Unspecified atrial fibrillation: Secondary | ICD-10-CM

## 2011-05-01 DIAGNOSIS — I495 Sick sinus syndrome: Secondary | ICD-10-CM

## 2011-05-01 NOTE — Assessment & Plan Note (Signed)
Plan at this point is to continue Cardizem CD 120 mg daily as well as Coumadin. If she continues to manifest breakthrough rapid atrial fibrillation, then we will consider flecainide for rhythm suppression. She has already undergone a negative ischemic workup.

## 2011-05-01 NOTE — Progress Notes (Signed)
Clinical Summary [name] [name] is a 71 y.o.female presenting for a post hospital followup. She had developed an episode of rapid atrial fibrillation that was prolonged, ultimately converted to sinus rhythm with rate control. As noted previously, she has had trouble with bradycardia, had to come off of bisoprolol, but has tolerated Cardizem CD 120 mg daily. She reports no palpitations over the last week.  We discussed the matter, and for now will make no specific changes, realizing that we could pursue antiarrhythmic therapy next, as we discussed at the last visit. Flecainide might be a good step in light of her negative ischemic workup.   Allergies  Allergen Reactions  . Advair Hfa   . Amoxicillin     REACTION: rash  . Bupropion Hcl   . Escitalopram Oxalate   . Lisinopril   . Penicillins     Current Outpatient Prescriptions  Medication Sig Dispense Refill  . antiseptic oral rinse (BIOTENE) LIQD 15 mLs by Mouth Rinse route as needed.        . bethanechol (URECHOLINE) 10 MG tablet Take 10 mg by mouth 2 (two) times daily.        Marland Kitchen BIOTIN 5000 PO Take 1 tablet by mouth daily.        . budesonide (PULMICORT) 0.5 MG/2ML nebulizer solution Take 2 mLs (0.5 mg total) by nebulization daily.  60 mL  11  . Calcium Carbonate-Vit D-Min 600-400 MG-UNIT TABS Take 2 tablets by mouth daily.       . cycloSPORINE (RESTASIS) 0.05 % ophthalmic emulsion Place 1 drop into both eyes 2 (two) times daily.        Marland Kitchen diltiazem (CARDIZEM) 30 MG tablet Take 30 mg by mouth 4 (four) times daily as needed.       . diltiazem (DILACOR XR) 120 MG 24 hr capsule Take 120 mg by mouth every morning.      . docusate-casanthranol (PERICOLACE) 100-30 MG per capsule Take 1 capsule by mouth daily as needed.      Marland Kitchen erythromycin (ERYPED 400) 400 MG/5ML suspension Take 2 tablets 1 hour before dental appt.       . Flaxseed, Linseed, (FLAXSEED OIL) 1000 MG CAPS Take 1 capsule by mouth daily.        . fluticasone (FLONASE) 50 MCG/ACT  nasal spray Place 2 sprays into the nose as needed.        . Glycerin-Sodium Chloride 1-0.35 % SOLN Place 1 spray into the nose daily.        Marland Kitchen losartan (COZAAR) 50 MG tablet Take 50 mg by mouth 2 (two) times daily.       . NON FORMULARY CPAP 10 CWP [facility] Heated humidifier and supplies for Dx OSA       . Omega-3 Fatty Acids (FISH OIL) 1200 MG CAPS Take 2 capsules by mouth daily.        Marland Kitchen omeprazole (PRILOSEC) 20 MG capsule Take 20 mg by mouth 2 (two) times daily.       . potassium chloride SA (K-DUR,KLOR-CON) 20 MEQ tablet Take 20 mEq by mouth daily.        . Red Yeast Rice Extract (RED YEAST RICE PO) Take 1 capsule by mouth daily.        . Saline (AYR SALINE NASAL DROPS) 0.65 % (SOLN) SOLN Place 1 spray into the nose as needed.        . warfarin (COUMADIN) 5 MG tablet Take 5 mg by mouth as needed.        Marland Kitchen  Wheat Dextrin (BENEFIBER) POWD Take by mouth as needed.          Past Medical History  Diagnosis Date  . Allergic rhinitis   . 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 Hyperlipidemia   . Essential hypertension, benign   . Anxiety   . Depression   . Lumbar disc disease   . OSA (obstructive sleep apnea)     Social History [name] [name] reports that she has never smoked. She has never used smokeless tobacco. [name] [name] reports that she does not drink alcohol.  Review of Systems States she is trying to do some more swimming. Somewhat less short of breath recently. Otherwise reviewed and negative.  Physical Examination Filed Vitals:   05/01/11 1608  BP: 123/58  Pulse: 60   Overweight woman in no acute distress.  HEENT: Conjunctiva and lids normal, oropharynx with moist mucosa.  Neck: Supple, no elevated JVP or bruits.  Lungs: Clear to auscultation, diminished, nonlabored.  Cardiac: Regular rate and rhythm, distant heart sounds, no significant systolic murmur.  Abdomen: Soft, nontender, bowel sounds present.  Extremities: No  pitting edema.      Problem List and Plan

## 2011-05-01 NOTE — Patient Instructions (Signed)
Continue all current medications. Follow up in 3 months - see above 

## 2011-05-01 NOTE — Assessment & Plan Note (Signed)
Also need to keep an eye on heart rate with her propensity to bradycardia. She is tolerating the current dose of Cardizem.

## 2011-05-04 ENCOUNTER — Telehealth: Payer: Self-pay | Admitting: Cardiology

## 2011-05-04 ENCOUNTER — Encounter: Payer: Self-pay | Admitting: *Deleted

## 2011-05-04 DIAGNOSIS — Z79899 Other long term (current) drug therapy: Secondary | ICD-10-CM

## 2011-05-04 DIAGNOSIS — I4891 Unspecified atrial fibrillation: Secondary | ICD-10-CM

## 2011-05-04 MED ORDER — FLECAINIDE ACETATE 50 MG PO TABS: 50.0000 mg | ORAL_TABLET | Freq: Two times a day (BID) | ORAL | Status: DC

## 2011-05-04 NOTE — Telephone Encounter (Signed)
We have already discussed Flecainide. She could go ahead and start flecainide 50 mg p.o. twice daily. Should have a followup 12-lead ECG in one week's time, then arrange a standard GXT on medical therapy to exclude proarrhythmia in 2 weeks' time. I would like to see her back in one month. I am forwarding this to nursing to make arrangements.

## 2011-05-04 NOTE — Telephone Encounter (Signed)
Pt notified. She will begin Flecainide. She will return on 2/11 for EKG. GXT will be scheduled and pt will be notified of appt information. Instruction sheet will be mailed to pt. Pt's May appt r/s to March. Pt verbalized understanding.

## 2011-05-04 NOTE — Telephone Encounter (Signed)
[name] [name] was seen in the office 05/01/2011 and states that around 1:30am on 05/01/2011  she went into A-Fib and this lasted For 6 1/2  hours. [name] is wanting to know if she should go ahead and start Flecainide or does she need to come Back into the office for another visit with Dr. Diona Browner.

## 2011-05-06 ENCOUNTER — Encounter: Payer: Self-pay | Admitting: Internal Medicine

## 2011-05-06 ENCOUNTER — Ambulatory Visit (INDEPENDENT_AMBULATORY_CARE_PROVIDER_SITE_OTHER): Payer: Medicare Other | Admitting: Internal Medicine

## 2011-05-06 VITALS — BP 132/50 | HR 51 | Ht 65.0 in | Wt 198.2 lb

## 2011-05-06 DIAGNOSIS — G4733 Obstructive sleep apnea (adult) (pediatric): Secondary | ICD-10-CM

## 2011-05-06 DIAGNOSIS — J309 Allergic rhinitis, unspecified: Secondary | ICD-10-CM

## 2011-05-06 MED ORDER — FLUTICASONE PROPIONATE 50 MCG/ACT NA SUSP: 2.0000 | Freq: Every day | NASAL | Status: DC

## 2011-05-06 MED ORDER — PHENYLEPHRINE HCL 1 % NA SOLN
3.0000 [drp] | Freq: Once | NASAL | Status: AC
  Administered 2011-05-06: 3 [drp] via NASAL

## 2011-05-06 MED ORDER — METHYLPREDNISOLONE ACETATE 80 MG/ML IJ SUSP
80.0000 mg | Freq: Once | INTRAMUSCULAR | Status: AC
  Administered 2011-05-06: 80 mg via INTRAMUSCULAR

## 2011-05-06 NOTE — Progress Notes (Signed)
05/06/11- 70 yoF never smoker followed for OSA, allergic rhinitis, complicated by AFib/coumadin LOV 05/05/10 She has had more persistent problems with nasal stuffiness and congestion and postnasal drip with global headache. This interferes with her ability to wear CPAP comfortably. She is using a fullface mask from [place] but she doesn't know her pressure. Ongoing problem with dryness of the eyes and nose. Since last here has had hip and back surgeries, treatment for atrial fibrillation with Coumadin.   ROS-see HPI Constitutional:   No-   weight loss, night sweats, fevers, chills, fatigue, lassitude. HEENT:   No-  headaches, difficulty swallowing, tooth/dental problems, sore throat,       No-  sneezing, itching,  +ear ache,  +nasal congestion, post nasal drip,  CV:  No-   chest pain, orthopnea, PND, swelling in lower extremities, anasarca, dizziness, palpitations Resp: No-   shortness of breath with exertion or at rest.              No-   productive cough,  No non-productive cough,  No- coughing up of blood.              No-   change in color of mucus.  No- wheezing.   Skin: No-   rash or lesions. GI:  No-   heartburn, indigestion, abdominal pain, nausea, vomiting, diarrhea,                 change in bowel habits, loss of appetite GU:  MS:  No-   joint pain or swelling.  No- decreased range of motion.  No- back pain. Neuro-     nothing unusual Psych:  No- change in mood or affect. No depression or anxiety.  No memory loss.   OBJ- Physical Exam General- Alert, Oriented, Affect-appropriate, Distress- none acute Skin- rash-none, lesions- none, excoriation- none Lymphadenopathy- none Head- atraumatic            Eyes- Gross vision intact, PERRLA, conjunctivae and secretions clear            Ears- Hearing, canals-normal            Nose- turbinate edema, no-Septal dev, mucus, polyps, erosion, perforation             Throat- Mallampati III , mucosa clear , drainage- none, tonsils-  atrophic Neck- flexible , trachea midline, no stridor , thyroid nl, carotid no bruit Chest - symmetrical excursion , unlabored           Heart/CV- RRR to palpation now , no murmur , no gallop  , no rub, nl s1 s2                           - JVD- none , edema- none, stasis changes- none, varices- none           Lung- clear to P&A, wheeze- none, cough- none , dullness-none, rub- none           Chest wall-  Abd- Br/ Gen/ Rectal- Not done, not indicated Extrem- cyanosis- none, clubbing, none, atrophy- none, strength- nl Neuro- grossly intact to observation

## 2011-05-06 NOTE — Patient Instructions (Signed)
Script sent refill Flonase  Neb neo nasal    Depo 80  We will contact [facility] to get the pressure setting of your CPAP now.

## 2011-05-09 NOTE — Assessment & Plan Note (Signed)
Discomfort from surgeries and from her complaint of dryness is interfering with her CPAP use. We are getting the pressure setting from her DME company. Weight loss would help.

## 2011-05-09 NOTE — Assessment & Plan Note (Signed)
She has been using Flonase. I considered a trial of a combination steroid and antihistamine nasal spray but I think the additional drying be counterproductive. We discussed saline nasal spray versus rinse. Plan-nasal nebulizer, Depo-Medrol, refill Flonase, use Saline rinse.

## 2011-05-11 ENCOUNTER — Ambulatory Visit (INDEPENDENT_AMBULATORY_CARE_PROVIDER_SITE_OTHER): Payer: Medicare Other | Admitting: *Deleted

## 2011-05-11 DIAGNOSIS — I4891 Unspecified atrial fibrillation: Secondary | ICD-10-CM

## 2011-05-11 NOTE — Progress Notes (Signed)
Came for EKG since starting flecainide times 1 week. EKG reviewed by MD and okay. Patient already scheduled for stress test and will f/u with MD on March 21st.

## 2011-05-18 ENCOUNTER — Telehealth: Payer: Self-pay | Admitting: *Deleted

## 2011-05-18 DIAGNOSIS — I4891 Unspecified atrial fibrillation: Secondary | ICD-10-CM

## 2011-05-18 NOTE — Telephone Encounter (Signed)
Pt left a message to ask if it mattered if she took her Flecainide in the morning or at night.   Pt's flecainide was prescribed for two times a day. Pt notified and verbalized understanding.

## 2011-05-20 ENCOUNTER — Telehealth: Payer: Self-pay | Admitting: Cardiology

## 2011-05-20 NOTE — Telephone Encounter (Signed)
[name] [name] called to say that she accidentally took her diltiazem (DILACOR XR) 120 MG 24 hr capsule  Take 120 mg by mouth every morning and a dose of coumdin last evening at 9:00pm and then again this Morning at 7:00am.  She is concerned that she has taken too much of medications. Please call her on cell #.

## 2011-05-20 NOTE — Telephone Encounter (Signed)
Pt states she has her meds in pill boxes. They are different colors for am vs pm. She usually takes her Diltiazem 120 mg and Coumadin at night, which she did last night. This morning she accidentally took her night meds and took the Diltiazem and Coumadin again. Pt is aware to monitor BP/HR to make sure they do not decrease especially if she feels dizzy or lightheaded. She will notify us or go to the ER if needed for decreased BP/HR. She is aware not to take diltiazem/Coumadin tonight as she has already taken them this morning.

## 2011-06-18 ENCOUNTER — Encounter: Payer: Self-pay | Admitting: Cardiology

## 2011-06-18 ENCOUNTER — Ambulatory Visit (INDEPENDENT_AMBULATORY_CARE_PROVIDER_SITE_OTHER): Payer: Medicare Other | Admitting: Cardiology

## 2011-06-18 VITALS — BP 140/51 | HR 47 | Ht 63.5 in | Wt 196.0 lb

## 2011-06-18 DIAGNOSIS — I495 Sick sinus syndrome: Secondary | ICD-10-CM

## 2011-06-18 DIAGNOSIS — I10 Essential (primary) hypertension: Secondary | ICD-10-CM

## 2011-06-18 DIAGNOSIS — I4891 Unspecified atrial fibrillation: Secondary | ICD-10-CM

## 2011-06-18 NOTE — Assessment & Plan Note (Signed)
Bradycardia while in sinus rhythm, particularly on Cardizem. She seems to be tolerating things fairly well at this time. No clear indication for device therapy. Continue observation.

## 2011-06-18 NOTE — Assessment & Plan Note (Signed)
Symptomatically much better controlled, now on flecainide and continuing on Coumadin. ECG is reviewed. No change to present regimen.

## 2011-06-18 NOTE — Progress Notes (Signed)
Clinical Summary Ms. Hallmon is a 71 y.o.female presenting for followup. She was seen in February. In the interim she was initiated on Flecainide related to breakthrough symptomatic atrial fibrillation. Followup GXT on medical therapy showed no inducible ventricular arrhythmias. She states that she has done quite well, with no significant palpitations, tolerating medications so far.  ECG is reviewed below showing sinus bradycardia, normal QRS duration.  She brought in blood pressure measurements for review, is still hypertensive. We continue to discuss diet, sodium restriction, exercise. She does state that she has been going to the pool 3 times a week.   Allergies  Allergen Reactions  . Advair Hfa   . Amoxicillin     REACTION: rash  . Bupropion Hcl   . Escitalopram Oxalate   . Lisinopril   . Other     Microdantin  . Penicillins   . Serevent     Current Outpatient Prescriptions  Medication Sig Dispense Refill  . albuterol (PROVENTIL) (2.5 MG/3ML) 0.083% nebulizer solution Take 2.5 mg by nebulization every 6 (six) hours as needed.      Marland Kitchen antiseptic oral rinse (BIOTENE) LIQD 15 mLs by Mouth Rinse route as needed.        . bethanechol (URECHOLINE) 10 MG tablet Take 10 mg by mouth 2 (two) times daily.        Marland Kitchen BIOTIN 5000 PO Take 1 tablet by mouth daily.        . budesonide (PULMICORT) 0.5 MG/2ML nebulizer solution Take 2 mLs (0.5 mg total) by nebulization daily.  60 mL  11  . Calcium Carbonate-Vit D-Min 600-400 MG-UNIT TABS Take 2 tablets by mouth daily.       . cycloSPORINE (RESTASIS) 0.05 % ophthalmic emulsion Place 1 drop into both eyes 2 (two) times daily.        Marland Kitchen diltiazem (CARDIZEM) 30 MG tablet Take 30 mg by mouth 4 (four) times daily as needed.       . diltiazem (DILACOR XR) 120 MG 24 hr capsule Take 120 mg by mouth every morning.      . docusate-casanthranol (PERICOLACE) 100-30 MG per capsule Take 1 capsule by mouth daily as needed.      Marland Kitchen erythromycin (ERYPED 400) 400  MG/5ML suspension Take 2 tablets 1 hour before dental appt.       . Flaxseed, Linseed, (FLAXSEED OIL) 1000 MG CAPS Take 1 capsule by mouth daily.        . flecainide (TAMBOCOR) 50 MG tablet Take 1 tablet (50 mg total) by mouth 2 (two) times daily.  60 tablet  6  . fluticasone (FLONASE) 50 MCG/ACT nasal spray Place 2 sprays into the nose daily.  16 g  prn  . losartan (COZAAR) 50 MG tablet Take 50 mg by mouth 2 (two) times daily.       . NON FORMULARY CPAP 10 CWP [facility] Heated humidifier and supplies for Dx OSA       . Omega-3 Fatty Acids (FISH OIL) 1200 MG CAPS Take 2 capsules by mouth daily.        Marland Kitchen omeprazole (PRILOSEC) 20 MG capsule Take 20 mg by mouth 2 (two) times daily.       . potassium chloride SA (K-DUR,KLOR-CON) 20 MEQ tablet Take 20 mEq by mouth daily.        . Red Yeast Rice Extract (RED YEAST RICE PO) Take 1 capsule by mouth daily.        . Saline (AYR SALINE NASAL DROPS) 0.65 % (  SOLN) SOLN Place 1 spray into the nose as needed.        . warfarin (COUMADIN) 5 MG tablet Take 5 mg by mouth as needed.          Past Medical History  Diagnosis Date  . Allergic rhinitis   . 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 Hyperlipidemia   . Essential hypertension, benign   . Anxiety   . Depression   . Lumbar disc disease   . OSA (obstructive sleep apnea)     Past Surgical History  Procedure Date  . Cataract extraction   . Lasik     Both eyes  . 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 Anterior fusion lumbar spine     Family History  Problem Relation Age of Onset  . Cancer Mother   . Heart attack Father     Social History Ms. Vader reports that she has never smoked. She has never used smokeless tobacco. Ms. Comins reports that she does not drink alcohol.  Review of Systems No reported bleeding problems. 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 Otherwise negative.  Physical Examination Filed Vitals:   06/18/11 1007  BP: 140/51  Pulse: 47   Overweight woman in no acute distress.  HEENT: Conjunctiva and lids normal, oropharynx with moist mucosa.  Neck: Supple, no elevated JVP or bruits.  Lungs: Clear to auscultation, diminished, nonlabored.  Cardiac: Regular rate and rhythm, distant heart sounds, no significant systolic murmur.  Abdomen: Soft, nontender, bowel sounds present.  Extremities: No pitting edema.  Musculoskeletal: No kyphosis. Neuropsychiatric: Alert and oriented x3, affect appropriate.   ECG Sinus bradycardia at 48 with QRS 82 ms.    Problem List and Plan

## 2011-06-18 NOTE — Assessment & Plan Note (Signed)
We discussed diet, sodium restriction, exercise. She reports compliance with her medications.

## 2011-06-18 NOTE — Patient Instructions (Signed)
Follow up in 3 months. Your physician recommends that you continue on your current medications as directed. Please refer to the Current Medication list given to you today. 

## 2011-06-24 DIAGNOSIS — I4891 Unspecified atrial fibrillation: Secondary | ICD-10-CM

## 2011-06-25 ENCOUNTER — Other Ambulatory Visit: Payer: Self-pay | Admitting: *Deleted

## 2011-06-25 ENCOUNTER — Encounter: Payer: Self-pay | Admitting: Cardiology

## 2011-06-25 DIAGNOSIS — I4892 Unspecified atrial flutter: Secondary | ICD-10-CM

## 2011-06-25 DIAGNOSIS — I4891 Unspecified atrial fibrillation: Secondary | ICD-10-CM

## 2011-06-25 MED ORDER — FLECAINIDE ACETATE 100 MG PO TABS: 100.0000 mg | ORAL_TABLET | Freq: Two times a day (BID) | ORAL | Status: DC

## 2011-06-25 NOTE — Progress Notes (Signed)
Flecainide dose increased per voice e-mail send by Dr. Earnestine Leys following DCCV today with hospitalization. New RX sent to pharmacy for increased dose.

## 2011-06-29 ENCOUNTER — Other Ambulatory Visit: Payer: Self-pay | Admitting: Cardiology

## 2011-06-29 DIAGNOSIS — I4891 Unspecified atrial fibrillation: Secondary | ICD-10-CM

## 2011-07-01 DIAGNOSIS — I4891 Unspecified atrial fibrillation: Secondary | ICD-10-CM

## 2011-07-03 ENCOUNTER — Ambulatory Visit (INDEPENDENT_AMBULATORY_CARE_PROVIDER_SITE_OTHER): Payer: Medicare Other | Admitting: *Deleted

## 2011-07-03 DIAGNOSIS — I4891 Unspecified atrial fibrillation: Secondary | ICD-10-CM

## 2011-07-03 DIAGNOSIS — Z7901 Long term (current) use of anticoagulants: Secondary | ICD-10-CM | POA: Insufficient documentation

## 2011-07-03 LAB — POCT INR: INR: 2

## 2011-07-10 ENCOUNTER — Ambulatory Visit (INDEPENDENT_AMBULATORY_CARE_PROVIDER_SITE_OTHER): Payer: Medicare Other | Admitting: *Deleted

## 2011-07-10 DIAGNOSIS — I4891 Unspecified atrial fibrillation: Secondary | ICD-10-CM

## 2011-07-10 DIAGNOSIS — Z7901 Long term (current) use of anticoagulants: Secondary | ICD-10-CM

## 2011-07-10 LAB — POCT INR: INR: 2.9

## 2011-07-14 ENCOUNTER — Encounter: Payer: Self-pay | Admitting: Cardiology

## 2011-07-14 ENCOUNTER — Ambulatory Visit (INDEPENDENT_AMBULATORY_CARE_PROVIDER_SITE_OTHER): Payer: Medicare Other | Admitting: *Deleted

## 2011-07-14 ENCOUNTER — Ambulatory Visit (INDEPENDENT_AMBULATORY_CARE_PROVIDER_SITE_OTHER): Payer: Medicare Other | Admitting: Physician Assistant

## 2011-07-14 VITALS — BP 148/76 | HR 98 | Ht 63.5 in | Wt 196.0 lb

## 2011-07-14 DIAGNOSIS — I4891 Unspecified atrial fibrillation: Secondary | ICD-10-CM

## 2011-07-14 DIAGNOSIS — Z7901 Long term (current) use of anticoagulants: Secondary | ICD-10-CM

## 2011-07-14 DIAGNOSIS — I4892 Unspecified atrial flutter: Secondary | ICD-10-CM

## 2011-07-14 DIAGNOSIS — I10 Essential (primary) hypertension: Secondary | ICD-10-CM

## 2011-07-14 LAB — POCT INR: INR: 3.4

## 2011-07-14 NOTE — Patient Instructions (Signed)
Your physician wants you to follow-up in: 4 months. You will receive a reminder letter in the mail one-two months in advance. If you don't receive a letter, please call our office to schedule the follow-up appointment. Stop Potassium (K-dur)

## 2011-07-14 NOTE — Assessment & Plan Note (Signed)
Maintaining NSR, status post recent successful DCCV. Continue increased flecainide dose, in conjunction with current low-dose Cardizem CD. Patient does have persistent bradycardia, previously documented, but is essentially asymptomatic. Of note, patient had recent outpatient GXT which was negative for proarrhythmia affect.

## 2011-07-14 NOTE — Assessment & Plan Note (Signed)
Patient had been on HCTZ in the past, but suggests that this was not very effective. I suspect this is why she remains on supplemental potassium, given that she is not on any other diuretic. She would prefer to try diet/exercise regimen for treatment of her HTN, including minimizing salt intake, rather than being placed back on another medication. Given this, I will DC supplemental potassium.

## 2011-07-14 NOTE — Assessment & Plan Note (Signed)
Followed here in our Coumadin clinic 

## 2011-07-14 NOTE — Progress Notes (Signed)
HPI: Patient presents for post hospital followup, following recent presentation here at [facility] with symptomatic atrial flutter.  She was seen in consultation by Dr. Andee Lineman, who increased her flecainide dose to 100 twice a day. She was therapeutic on Coumadin, and underwent subsequent successful DCCV, by Dr. Andee Lineman, with immediate restoration of NSR with 120 J. He then arranged for an outpatient routine treadmill test to rule out proarrhythmia effect, and this was negative. He recommended she be discharged on Cardizem CD 120 daily, as well as flecainide 100 twice a day. Of note, TSH and potassium levels were normal.  Clinically, she has not had any recurrent sustained tachycardia palpitations. She underwent successful resection of a basal cell CA lesion on her nose yesterday, on Coumadin. She has been followed weekly in our clinic, since undergoing DC CVP.  Patient checks her vitals fairly regularly at home; she cites SBPs in the 160 range, with pulse readings of 47-52. She denies presyncope/syncope, but does feel "tired".  EKG today indicates sinus bradycardia at 45 bpm.  Allergies  Allergen Reactions  . Advair Hfa   . Amoxicillin     REACTION: rash  . Bupropion Hcl   . Escitalopram Oxalate   . Lisinopril   . Other     Microdantin  . Penicillins   . Serevent     Current Outpatient Prescriptions  Medication Sig Dispense Refill  . albuterol (PROVENTIL) (2.5 MG/3ML) 0.083% nebulizer solution Take 2.5 mg by nebulization every 6 (six) hours as needed.      Marland Kitchen antiseptic oral rinse (BIOTENE) LIQD 15 mLs by Mouth Rinse route as needed.        . bethanechol (URECHOLINE) 10 MG tablet Take 10 mg by mouth 2 (two) times daily.        Marland Kitchen BIOTIN 5000 PO Take 1 tablet by mouth daily.        . budesonide (PULMICORT) 0.5 MG/2ML nebulizer solution Take 2 mLs (0.5 mg total) by nebulization daily.  60 mL  11  . Calcium Carbonate-Vit D-Min 600-400 MG-UNIT TABS Take 2 tablets by mouth daily.       .  cycloSPORINE (RESTASIS) 0.05 % ophthalmic emulsion Place 1 drop into both eyes 2 (two) times daily.        Marland Kitchen diltiazem (CARDIZEM) 30 MG tablet Take 30 mg by mouth 4 (four) times daily as needed.       . diltiazem (DILACOR XR) 120 MG 24 hr capsule Take 120 mg by mouth every morning.      . docusate-casanthranol (PERICOLACE) 100-30 MG per capsule Take 1 capsule by mouth daily as needed.      Marland Kitchen erythromycin (ERYPED 400) 400 MG/5ML suspension Take 2 tablets 1 hour before dental appt.       . Flaxseed, Linseed, (FLAXSEED OIL) 1000 MG CAPS Take 1 capsule by mouth daily.        . flecainide (TAMBOCOR) 100 MG tablet Take 1 tablet (100 mg total) by mouth 2 (two) times daily.  60 tablet  6  . fluticasone (FLONASE) 50 MCG/ACT nasal spray Place 2 sprays into the nose daily.  16 g  prn  . losartan (COZAAR) 50 MG tablet Take 50 mg by mouth 2 (two) times daily.       . NON FORMULARY CPAP 10 CWP [facility] Heated humidifier and supplies for Dx OSA       . Omega-3 Fatty Acids (FISH OIL) 1200 MG CAPS Take 2 capsules by mouth daily.        Marland Kitchen  omeprazole (PRILOSEC) 20 MG capsule Take 20 mg by mouth 2 (two) times daily.       . potassium chloride SA (K-DUR,KLOR-CON) 20 MEQ tablet Take 20 mEq by mouth daily.        . Red Yeast Rice Extract (RED YEAST RICE PO) Take 1 capsule by mouth daily.        . Saline (AYR SALINE NASAL DROPS) 0.65 % (SOLN) SOLN Place 1 spray into the nose as needed.        . warfarin (COUMADIN) 5 MG tablet Take 5 mg by mouth as needed.          Past Medical History  Diagnosis Date  . Allergic rhinitis   . 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 Hyperlipidemia   . Essential hypertension, benign   . Anxiety   . Depression   . Lumbar disc disease   . OSA (obstructive sleep apnea)     Past Surgical History  Procedure Date  . Cataract extraction   . Lasik     Both eyes  . 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 Anterior fusion lumbar spine      History   Social History  . Marital Status: Single    Spouse Name: N/A    Number of Children: N/A  . Years of Education: N/A   Occupational History  . Retired: Engineer, site    Social History Main Topics  . Smoking status: Never Smoker   . Smokeless tobacco: Never Used  . Alcohol Use: No  . Drug Use: No  . Sexually Active: Not on file   Other Topics Concern  . Not on file   Social History Narrative  . No narrative on file    Family History  Problem Relation Age of Onset  . Cancer Mother   . Heart attack Father     ROS: no nausea, vomiting; no fever, chills; no melena, hematochezia; no claudication  PHYSICAL EXAM: Ht 5' 3.5" (1.613 m)  Wt 196 lb (88.905 kg)  BMI 34.18 kg/m2 GENERAL: 71 year-old female, obese; NAD HEENT: NCAT, PERRLA, EOMI; sclera clear; no xanthelasma NECK: no JVD; no TM LUNGS: CTA bilaterally CARDIAC: RRR (S1, S2); no significant murmurs; no rubs or gallops ABDOMEN: Protuberant EXTREMETIES: no significant peripheral edema SKIN: warm/dry; no obvious rash/lesions MUSCULOSKELETAL: no joint deformity NEURO: no focal deficit; NL affect   EKG: reviewed and available in Electronic Records   ASSESSMENT & PLAN:

## 2011-07-14 NOTE — Progress Notes (Signed)
Patient seen and examined with Mr. Shara Blazing. Reviewed recent hospitalization with DCCV for atrial flutter, on Coumadin and low dose flecainide. She was seen in consultation by Dr. Andee Lineman. She reports improvement in palpitations, is in sinus rhythm today. She is noted to have bradycardia with mildly prolonged PR interval on Cardizem CD. Seems to be tolerating this however. Flecainide is now at 100 mg twice daily which we will continue. Potassium supplements are being discontinued. Followup arranged.  [name], M.D., F.A.C.C.

## 2011-07-17 ENCOUNTER — Telehealth: Payer: Self-pay | Admitting: *Deleted

## 2011-07-17 NOTE — Telephone Encounter (Signed)
Patient called with questions about her atrial fib.  Wanting to know how she could tell if she was in it.  States she has had cardioversion & on meds for this now so h

## 2011-07-24 ENCOUNTER — Ambulatory Visit (INDEPENDENT_AMBULATORY_CARE_PROVIDER_SITE_OTHER): Payer: Medicare Other | Admitting: *Deleted

## 2011-07-24 DIAGNOSIS — I4891 Unspecified atrial fibrillation: Secondary | ICD-10-CM

## 2011-07-24 DIAGNOSIS — Z7901 Long term (current) use of anticoagulants: Secondary | ICD-10-CM

## 2011-07-24 LAB — POCT INR: INR: 2.9

## 2011-08-03 ENCOUNTER — Ambulatory Visit: Payer: Medicare Other | Admitting: Cardiology

## 2011-08-04 ENCOUNTER — Ambulatory Visit (INDEPENDENT_AMBULATORY_CARE_PROVIDER_SITE_OTHER): Payer: Medicare Other | Admitting: *Deleted

## 2011-08-04 VITALS — BP 127/76 | HR 55 | Ht 63.0 in | Wt 197.0 lb

## 2011-08-04 DIAGNOSIS — I4891 Unspecified atrial fibrillation: Secondary | ICD-10-CM

## 2011-08-04 NOTE — Progress Notes (Signed)
Pt states she is unsure when to take diltiazem 30 mg. She states her HR has been staying low (50's-60's) since medication changes. She states she really cannot feel her pulse in her wrist to check for irregular HR. She generally uses a BP machine to determine this but wonders if this is accurate. Pt instructed she can get an inexpensive stethoscope to listen to her heart rhythm for irregularity. Also, if HR has remained in 50's-60's and then becomes high (90's or above) (and BP is okay) she could take diltiazem 30 mg. Pt verbalized understanding.

## 2011-08-04 NOTE — Progress Notes (Signed)
Patient requested to come in for EKG per telephone message. Patient c/o increased sob and fatigue today.

## 2011-08-13 ENCOUNTER — Telehealth: Payer: Self-pay | Admitting: Internal Medicine

## 2011-08-13 MED ORDER — BUDESONIDE 0.5 MG/2ML IN SUSP: 0.5000 mg | Freq: Every day | RESPIRATORY_TRACT | Status: DC

## 2011-08-13 NOTE — Telephone Encounter (Signed)
Pt was seen by CDY 2.6.13.  No documentation about a refill request in pt's chart.  [facility], spoke with Amy who stated they received a denial via electronic refill request.  No sign of this in pt's chart.  Placed Amy on hold and spoke with Florentina Addison who stated that she had to "link" the patient but there was an error; as long as pt has been seen recently, may have refills.  Spoke with Amy again, apologized for the confusion and authorized refills as last prescribed.    Pt's med list states budesonide 0.5mg  once daily #52mL with 11 refills.  Past ov notes supports this dosing and instructions.  Med list updated.

## 2011-09-01 ENCOUNTER — Ambulatory Visit: Payer: Medicare Other | Admitting: Cardiology

## 2011-09-04 ENCOUNTER — Ambulatory Visit: Payer: Medicare Other | Admitting: Cardiology

## 2011-10-12 ENCOUNTER — Other Ambulatory Visit: Payer: Self-pay | Admitting: Cardiology

## 2011-10-12 MED ORDER — DILTIAZEM HCL ER 120 MG PO CP24: 120.0000 mg | ORAL_CAPSULE | ORAL | Status: DC

## 2011-10-29 ENCOUNTER — Encounter: Payer: Self-pay | Admitting: Cardiology

## 2011-10-29 ENCOUNTER — Ambulatory Visit (INDEPENDENT_AMBULATORY_CARE_PROVIDER_SITE_OTHER): Payer: Medicare Other | Admitting: Cardiology

## 2011-10-29 VITALS — BP 128/78 | HR 50 | Ht 61.0 in | Wt 201.0 lb

## 2011-10-29 DIAGNOSIS — I4891 Unspecified atrial fibrillation: Secondary | ICD-10-CM

## 2011-10-29 DIAGNOSIS — I10 Essential (primary) hypertension: Secondary | ICD-10-CM

## 2011-10-29 DIAGNOSIS — I4892 Unspecified atrial flutter: Secondary | ICD-10-CM

## 2011-10-29 NOTE — Assessment & Plan Note (Signed)
Symptomatically controlled, in sinus rhythm on examination today. Continue current regimen including Cardizem CD, flecainide, and Coumadin.

## 2011-10-29 NOTE — Assessment & Plan Note (Signed)
No change to current regimen, blood pressure is reasonable today.

## 2011-10-29 NOTE — Assessment & Plan Note (Signed)
Quiescent following cardioversion earlier in the year.

## 2011-10-29 NOTE — Progress Notes (Signed)
Clinical Summary [name] [name] is a 71 y.o.female presenting for followup. She was seen in April. She reports no prolonged palpitations, no hospitalizations since last visit. She has been compliant with her medications, denies any significant bleeding problems.  Recently she has had trouble with a reported bladder infection, has some back pain and hip pain as well.  Otherwise no exertional chest pain, no falls or syncope.   Allergies  Allergen Reactions  . Amoxicillin     REACTION: rash  . Bupropion Hcl   . Escitalopram Oxalate   . Fluticasone-Salmeterol   . Lisinopril   . Other     Microdantin  . Penicillins   . Serevent     Current Outpatient Prescriptions  Medication Sig Dispense Refill  . albuterol (PROVENTIL) (2.5 MG/3ML) 0.083% nebulizer solution Take 2.5 mg by nebulization every 6 (six) hours as needed.      Marland Kitchen antiseptic oral rinse (BIOTENE) LIQD 15 mLs by Mouth Rinse route as needed.        . bethanechol (URECHOLINE) 10 MG tablet Take 10 mg by mouth 2 (two) times daily.        Marland Kitchen BIOTIN 5000 PO Take 1 tablet by mouth daily.        . budesonide (PULMICORT) 0.5 MG/2ML nebulizer solution Take 2 mLs (0.5 mg total) by nebulization daily.  60 mL  11  . Calcium Carbonate-Vit D-Min 600-400 MG-UNIT TABS Take 2 tablets by mouth daily.       . cycloSPORINE (RESTASIS) 0.05 % ophthalmic emulsion Place 1 drop into both eyes 2 (two) times daily.        Marland Kitchen diltiazem (CARDIZEM) 30 MG tablet Take 30 mg by mouth 4 (four) times daily as needed.       . diltiazem (DILACOR XR) 120 MG 24 hr capsule Take 1 capsule (120 mg total) by mouth every morning.  30 capsule  6  . docusate-casanthranol (PERICOLACE) 100-30 MG per capsule Take 1 capsule by mouth daily as needed.      Marland Kitchen erythromycin (ERYPED 400) 400 MG/5ML suspension Take 2 tablets 1 hour before dental appt.       . Flaxseed, Linseed, (FLAXSEED OIL) 1000 MG CAPS Take 1 capsule by mouth daily.        . flecainide (TAMBOCOR) 100 MG tablet Take  1 tablet (100 mg total) by mouth 2 (two) times daily.  60 tablet  6  . fluticasone (FLONASE) 50 MCG/ACT nasal spray Place 2 sprays into the nose daily.  16 g  prn  . NON FORMULARY CPAP 10 CWP [facility] Heated humidifier and supplies for Dx OSA       . Omega-3 Fatty Acids (FISH OIL) 1200 MG CAPS Take 2 capsules by mouth daily.        Marland Kitchen omeprazole (PRILOSEC) 20 MG capsule Take 20 mg by mouth 2 (two) times daily.       . Red Yeast Rice Extract (RED YEAST RICE PO) Take 1 capsule by mouth daily.        . Saline (AYR SALINE NASAL DROPS) 0.65 % (SOLN) SOLN Place 1 spray into the nose as needed.        . sulfamethoxazole-trimethoprim (BACTRIM DS,SEPTRA DS) 800-160 MG per tablet Take 1 tablet by mouth 2 (two) times daily.      . valsartan-hydrochlorothiazide (DIOVAN-HCT) 320-25 MG per tablet Take 1 tablet by mouth daily.      Marland Kitchen warfarin (COUMADIN) 5 MG tablet Take 5 mg by mouth as needed.  Past Medical History  Diagnosis Date  . Allergic rhinitis   . 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 Hyperlipidemia   . Essential hypertension, benign   . Anxiety   . Depression   . Lumbar disc disease   . OSA (obstructive sleep apnea)     Social History Ms. Daw reports that she has never smoked. She has never used smokeless tobacco. Ms. Gedeon reports that she does not drink alcohol.  Review of Systems Stable dyspnea on exertion, no orthopnea or PND. Stable appetite. Otherwise negative except as outlined.  Physical Examination Filed Vitals:   10/29/11 0829  BP: 128/78  Pulse: 50    Overweight woman in no acute distress.  HEENT: Conjunctiva and lids normal, oropharynx with moist mucosa.  Neck: Supple, no elevated JVP or bruits.  Lungs: Clear to auscultation, diminished, nonlabored.  Cardiac: Regular rate and rhythm, distant heart sounds, no significant systolic murmur.  Abdomen: Soft, nontender, bowel sounds present.  Extremities: No  pitting edema.  Musculoskeletal: No kyphosis.  Neuropsychiatric: Alert and oriented x3, affect appropriate.   Problem List and Plan   ATRIAL FIBRILLATION Symptomatically controlled, in sinus rhythm on examination today. Continue current regimen including Cardizem CD, flecainide, and Coumadin.  Atrial flutter Quiescent following cardioversion earlier in the year.  Essential hypertension, benign No change to current regimen, blood pressure is reasonable today.    [name], M.D., F.A.C.C.

## 2011-10-29 NOTE — Patient Instructions (Signed)
Continue all current medications. Follow up in  4 months  

## 2011-11-09 ENCOUNTER — Other Ambulatory Visit: Payer: Self-pay | Admitting: Physician Assistant

## 2012-02-29 ENCOUNTER — Other Ambulatory Visit: Payer: Self-pay | Admitting: Cardiology

## 2012-02-29 MED ORDER — FLECAINIDE ACETATE 100 MG PO TABS: 100.0000 mg | ORAL_TABLET | Freq: Two times a day (BID) | ORAL | Status: DC

## 2012-03-09 ENCOUNTER — Encounter: Payer: Self-pay | Admitting: Cardiology

## 2012-03-10 ENCOUNTER — Encounter: Payer: Self-pay | Admitting: Cardiology

## 2012-03-10 ENCOUNTER — Ambulatory Visit (INDEPENDENT_AMBULATORY_CARE_PROVIDER_SITE_OTHER): Payer: Medicare Other | Admitting: Cardiology

## 2012-03-10 VITALS — BP 149/82 | HR 55 | Ht 62.5 in | Wt 200.8 lb

## 2012-03-10 DIAGNOSIS — E785 Hyperlipidemia, unspecified: Secondary | ICD-10-CM

## 2012-03-10 DIAGNOSIS — I10 Essential (primary) hypertension: Secondary | ICD-10-CM

## 2012-03-10 DIAGNOSIS — I4891 Unspecified atrial fibrillation: Secondary | ICD-10-CM

## 2012-03-10 DIAGNOSIS — I495 Sick sinus syndrome: Secondary | ICD-10-CM

## 2012-03-10 MED ORDER — DILTIAZEM HCL ER COATED BEADS 120 MG PO CP24: 120.0000 mg | ORAL_CAPSULE | Freq: Every day | ORAL | Status: DC

## 2012-03-10 NOTE — Patient Instructions (Addendum)
Your physician recommends that you schedule a follow-up appointment in: 4 months. You will receive a reminder letter in the mail in about 1-2 months reminding you to call and schedule your appointment. If you don't receive this letter, please contact our office. Your physician recommends that you continue on your current medications as directed. Please refer to the Current Medication list given to you today. 

## 2012-03-10 NOTE — Assessment & Plan Note (Signed)
Keep planned followup with Dr. Sherryll Burger for physical in January.

## 2012-03-10 NOTE — Assessment & Plan Note (Signed)
Blood pressure elevated today. We discussed diet, sodium restriction, weight loss.

## 2012-03-10 NOTE — Assessment & Plan Note (Signed)
Fairly well controlled on medical therapy including Cardizem CD, Tambocor, and Coumadin. No changes made today. ECG reviewed.

## 2012-03-10 NOTE — Assessment & Plan Note (Signed)
Relatively quiescent. No dizziness or syncope. Has been able to tolerate her current medication doses.

## 2012-03-10 NOTE — Addendum Note (Signed)
Addended by: Eustace Moore on: 03/10/2012 08:48 AM   Modules accepted: Orders

## 2012-03-10 NOTE — Progress Notes (Signed)
Clinical Summary [name] [name] is a 71 y.o.female presenting for followup. She was seen in August. She reports episode of breakthrough atrial fibrillation back in November, fairly short-lived. She took a short acting Cardizem in addition to her routine regimen at that time. Otherwise she has been doing well. Reports no significant bleeding problems on Coumadin.  ECG today shows sinus bradycardia with normal QRS, poor progression.   Allergies  Allergen Reactions  . Amoxicillin     REACTION: rash  . Bupropion Hcl   . Escitalopram Oxalate   . Fluticasone-Salmeterol   . Lisinopril   . Other     Microdantin  . Penicillins   . Serevent     Current Outpatient Prescriptions  Medication Sig Dispense Refill  . albuterol (PROVENTIL) (2.5 MG/3ML) 0.083% nebulizer solution Take 2.5 mg by nebulization every 6 (six) hours as needed.      Marland Kitchen antiseptic oral rinse (BIOTENE) LIQD 15 mLs by Mouth Rinse route as needed.        Marland Kitchen azelastine (ASTELIN) 137 MCG/SPRAY nasal spray Place 2 sprays into the nose as needed.       . bethanechol (URECHOLINE) 10 MG tablet Take 10 mg by mouth 2 (two) times daily.        Marland Kitchen BIOTIN 5000 PO Take 1 tablet by mouth daily.        . budesonide (PULMICORT) 0.5 MG/2ML nebulizer solution Take 2 mLs (0.5 mg total) by nebulization daily.  60 mL  11  . Calcium Carbonate-Vit D-Min 600-400 MG-UNIT TABS Take 2 tablets by mouth daily.       . cycloSPORINE (RESTASIS) 0.05 % ophthalmic emulsion Place 1 drop into both eyes 2 (two) times daily.        Marland Kitchen dexlansoprazole (DEXILANT) 60 MG capsule Take 60 mg by mouth daily.      Marland Kitchen diltiazem (CARDIZEM CD) 120 MG 24 hr capsule TAKE 1 CAPSULE BY MOUTH EVERY DAY EMERGENCY REFILL FAXED DR  30 capsule  6  . diltiazem (CARDIZEM) 30 MG tablet Take 30 mg by mouth 4 (four) times daily as needed.       . docusate-casanthranol (PERICOLACE) 100-30 MG per capsule Take 1 capsule by mouth daily as needed.      Marland Kitchen erythromycin (ERYPED 400) 400 MG/5ML  suspension Take 2 tablets 1 hour before dental appt.       . flecainide (TAMBOCOR) 100 MG tablet Take 1 tablet (100 mg total) by mouth 2 (two) times daily.  60 tablet  6  . fluticasone (FLONASE) 50 MCG/ACT nasal spray Place 2 sprays into the nose daily.  16 g  prn  . NON FORMULARY CPAP 10 [facility] Heated humidifier and supplies for Dx OSA       . Saline (AYR SALINE NASAL DROPS) 0.65 % (SOLN) SOLN Place 1 spray into the nose as needed.        Marland Kitchen SPIRIVA HANDIHALER 18 MCG inhalation capsule Place 18 mcg into inhaler and inhale as needed.       . valsartan-hydrochlorothiazide (DIOVAN-HCT) 320-25 MG per tablet Take 1 tablet by mouth daily.      Marland Kitchen warfarin (COUMADIN) 5 MG tablet Take 5 mg by mouth as needed.        . [DISCONTINUED] diltiazem (DILACOR XR) 120 MG 24 hr capsule Take 1 capsule (120 mg total) by mouth every morning.  30 capsule  6    Past Medical History  Diagnosis Date  . Allergic rhinitis   . Atrial fibrillation  Element of tachycardia bradycardia syndrome  . Hyperlipidemia   . Essential hypertension, benign   . Anxiety   . Depression   . Lumbar disc disease   . OSA (obstructive sleep apnea)   . Atrial flutter     Social History [name] [name] reports that she has never smoked. She has never used smokeless tobacco. [name] [name] reports that she does not drink alcohol.  Review of Systems Has not been back to her regular exercise in the pool yet. Weight is increased. Good appetite. No syncope. Otherwise negative.  Physical Examination Filed Vitals:   03/10/12 0817  BP: 149/82  Pulse: 55   Filed Weights   03/10/12 0817  Weight: 200 lb 12.8 oz (91.082 kg)    Overweight woman in no acute distress.  HEENT: Conjunctiva and lids normal, oropharynx with moist mucosa.  Neck: Supple, no elevated JVP or bruits.  Lungs: Clear to auscultation, diminished, nonlabored.  Cardiac: Regular rate and rhythm, distant heart sounds, no significant systolic murmur.    Abdomen: Soft, nontender, bowel sounds present.  Extremities: No pitting edema.    Problem List and Plan   ATRIAL FIBRILLATION Fairly well controlled on medical therapy including Cardizem CD, Tambocor, and Coumadin. No changes made today. ECG reviewed.  BRADYCARDIA-TACHYCARDIA SYNDROME Relatively quiescent. No dizziness or syncope. Has been able to tolerate her current medication doses.  HYPERLIPIDEMIA-MIXED Keep planned followup with Dr. Sherryll Burger for physical in January.  Essential hypertension, benign Blood pressure elevated today. We discussed diet, sodium restriction, weight loss.    [name], M.D., F.A.C.C.

## 2012-06-01 ENCOUNTER — Encounter (INDEPENDENT_AMBULATORY_CARE_PROVIDER_SITE_OTHER): Payer: Self-pay | Admitting: *Deleted

## 2012-06-09 ENCOUNTER — Telehealth (INDEPENDENT_AMBULATORY_CARE_PROVIDER_SITE_OTHER): Payer: Self-pay | Admitting: *Deleted

## 2012-06-09 ENCOUNTER — Other Ambulatory Visit (INDEPENDENT_AMBULATORY_CARE_PROVIDER_SITE_OTHER): Payer: Self-pay | Admitting: *Deleted

## 2012-06-09 MED ORDER — PEG-KCL-NACL-NASULF-NA ASC-C 100 G PO SOLR: 1.0000 | Freq: Once | ORAL | Status: DC

## 2012-06-09 NOTE — Telephone Encounter (Signed)
Patient needs movi prep 

## 2012-06-22 ENCOUNTER — Encounter (INDEPENDENT_AMBULATORY_CARE_PROVIDER_SITE_OTHER): Payer: Self-pay | Admitting: *Deleted

## 2012-07-07 ENCOUNTER — Telehealth (INDEPENDENT_AMBULATORY_CARE_PROVIDER_SITE_OTHER): Payer: Self-pay | Admitting: *Deleted

## 2012-07-07 NOTE — Telephone Encounter (Signed)
  Procedure: tcs  Reason/Indication:  screening  Has patient had this procedure before?  Yes, 20 yrs ago  If so, when, by whom and where?    Is there a family history of colon cancer?  Not sure  Who?  What age when diagnosed?    Is patient diabetic?   no      Does patient have prosthetic heart valve?  no  Do you have a pacemaker?  no  Has patient ever had endocarditis? no  Has patient had joint replacement within last 12 months?  no  Is patient on Coumadin, Plavix and/or Aspirin? yes  Medications: coumadin 5 mg Sun, Tues, Wed, Thurs & Sat & 2.5 mg Mon & Fri, valsart/hctz 25 mg daily, diltiazem 120 mg daily and then may take additional 30 mg if she goes into afib, flecanide 100 mg bid, dexilant 60 mg daily, pulmicort 0.5 mg daily, albuterol 0.083% up to 4 times a day, theratears 6 drops each eye daily, flonase 0.05% 2 sprays daily, bethanechol 10 mg bid, CPAP machine, calcium w/ vit d 1200 mg bid, percolase prn, erythro-ethyl 400 mg 2 tab 1 hour prior to dental procedure  Allergies: lexapro, microdantin, welbutrin, pcn, serevent, lisinopril, advair  Medication Adjustment: coumadin 5 days  Procedure date & time: 08/04/12 at 1030

## 2012-07-07 NOTE — Telephone Encounter (Signed)
agree

## 2012-07-18 ENCOUNTER — Ambulatory Visit: Payer: Medicare Other | Admitting: Cardiology

## 2012-07-25 ENCOUNTER — Encounter (HOSPITAL_COMMUNITY): Payer: Self-pay | Admitting: Pharmacy Technician

## 2012-08-03 ENCOUNTER — Ambulatory Visit (INDEPENDENT_AMBULATORY_CARE_PROVIDER_SITE_OTHER): Payer: Medicare Other | Admitting: Cardiology

## 2012-08-03 ENCOUNTER — Encounter: Payer: Self-pay | Admitting: Cardiology

## 2012-08-03 VITALS — BP 116/60 | HR 48 | Ht 62.5 in | Wt 200.0 lb

## 2012-08-03 DIAGNOSIS — I4891 Unspecified atrial fibrillation: Secondary | ICD-10-CM

## 2012-08-03 DIAGNOSIS — I495 Sick sinus syndrome: Secondary | ICD-10-CM

## 2012-08-03 NOTE — Assessment & Plan Note (Signed)
Following for now, bradycardia noted. We have discussed the fact that she may ultimately need a pacemaker if we are unable to continue baseline medications for control of atrial arrhythmias without provoking further symptomatic bradycardia or pauses.

## 2012-08-03 NOTE — Patient Instructions (Addendum)

## 2012-08-03 NOTE — Progress Notes (Signed)
Clinical Summary [name] [name] is a 72 y.o.female last seen in December 2013. She states that in general she has done well. Still has a sense of fatigue, but she is doing regular water aerobics and also some light outdoor work. She reports no palpitations, no obvious breakthrough atrial arrhythmias since I last saw her. She reports no sudden dizziness or syncope.  Sinus bradycardia at 48 with prolonged PR interval and left anterior fascicular block. QRS duration 108 ms.   Allergies  Allergen Reactions  . Bupropion Hcl Other (See Comments)    Suicidal Thoughts.   . Escitalopram Oxalate Other (See Comments)    Suicidal Thoughts.   . Fluticasone-Salmeterol Other (See Comments)    Caused patient to go into Afib.   Marland Kitchen Lisinopril   . Serevent Other (See Comments)    Caused patient to go into Afib  . Amoxicillin Rash  . Other Rash    Microdantin  . Penicillins Rash    Current Outpatient Prescriptions  Medication Sig Dispense Refill  . albuterol (PROVENTIL) (2.5 MG/3ML) 0.083% nebulizer solution Take 2.5 mg by nebulization every 6 (six) hours as needed for wheezing.       Marland Kitchen antiseptic oral rinse (BIOTENE) LIQD 15 mLs by Mouth Rinse route as needed (Dry Mouth).       . bethanechol (URECHOLINE) 10 MG tablet Take 10 mg by mouth 2 (two) times daily.        Marland Kitchen BIOTIN 5000 PO Take 1 tablet by mouth daily.        . budesonide (PULMICORT) 0.5 MG/2ML nebulizer solution Take 0.5 mg by nebulization daily.      . Calcium Carbonate-Vit D-Min 600-400 MG-UNIT TABS Take 2 tablets by mouth daily.       Marland Kitchen dexlansoprazole (DEXILANT) 60 MG capsule Take 60 mg by mouth daily.      Marland Kitchen diltiazem (CARDIZEM CD) 120 MG 24 hr capsule Take 1 capsule (120 mg total) by mouth daily.  30 capsule  6  . diltiazem (CARDIZEM) 30 MG tablet Take 30 mg by mouth 4 (four) times daily as needed (If patient goes into AFIB).       Marland Kitchen docusate-casanthranol (PERICOLACE) 100-30 MG per capsule Take 1 capsule by mouth daily as needed for  constipation.       Marland Kitchen erythromycin (ERYPED 400) 400 MG/5ML suspension Take 2 tablets 1 hour before dental appt.       . flecainide (TAMBOCOR) 100 MG tablet Take 1 tablet (100 mg total) by mouth 2 (two) times daily.  60 tablet  6  . fluticasone (FLONASE) 50 MCG/ACT nasal spray Place 2 sprays into the nose daily as needed for allergies.      . peg 3350 powder (MOVIPREP) 100 G SOLR Take 1 kit (100 g total) by mouth once.  1 kit  0  . SPIRIVA HANDIHALER 18 MCG inhalation capsule Place 18 mcg into inhaler and inhale as needed (Shortness of Breath).       . valsartan-hydrochlorothiazide (DIOVAN-HCT) 320-25 MG per tablet Take 1 tablet by mouth daily.      Marland Kitchen omeprazole (PRILOSEC) 20 MG capsule Take 1 capsule by mouth daily. Will start once current supply of dexilant is completed      . warfarin (COUMADIN) 5 MG tablet Take 2.5-5 mg by mouth daily. Takes 2.5 on Mon and Fri. Takes 5 mg on all other days       No current facility-administered medications for this visit.    Past Medical History  Diagnosis  Date  . Allergic rhinitis   . 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 Hyperlipidemia   . Essential hypertension, benign   . Anxiety   . Depression   . Lumbar disc disease   . OSA (obstructive sleep apnea)   . Atrial flutter     Social History [name] [name] reports that she has never smoked. She has never used smokeless tobacco. [name] [name] reports that she does not drink alcohol.  Review of Systems Otherwise negative.  Physical Examination Filed Vitals:   08/03/12 0940  BP: 116/60  Pulse: 48   Filed Weights   08/03/12 0940  Weight: 200 lb (90.719 kg)    Overweight woman in no acute distress.  HEENT: Conjunctiva and lids normal, oropharynx with moist mucosa.  Neck: Supple, no elevated JVP or bruits.  Lungs: Clear to auscultation, diminished, nonlabored.  Cardiac: Regular rate and rhythm, distant heart sounds, no significant systolic murmur.    Abdomen: Soft, nontender, bowel sounds present.  Extremities: No pitting edema.    Problem List and Plan   ATRIAL FIBRILLATION Paroxysmal, also documented atrial flutter. Fortunately, these issues have been quiescent over the last several months on current regimen.  BRADYCARDIA-TACHYCARDIA SYNDROME Following for now, bradycardia noted. We have discussed the fact that she may ultimately need a pacemaker if we are unable to continue baseline medications for control of atrial arrhythmias without provoking further symptomatic bradycardia or pauses.    [name], M.D., F.A.C.C.

## 2012-08-03 NOTE — Assessment & Plan Note (Signed)
Paroxysmal, also documented atrial flutter. Fortunately, these issues have been quiescent over the last several months on current regimen.

## 2012-08-04 ENCOUNTER — Encounter (HOSPITAL_COMMUNITY)
Admission: RE | Disposition: A | Discharge status: Home / Self Care | Payer: Self-pay | Source: Ambulatory Visit | Attending: Internal Medicine

## 2012-08-04 ENCOUNTER — Ambulatory Visit (HOSPITAL_COMMUNITY)
Admission: RE | Admit: 2012-08-04 | Discharge: 2012-08-04 | Disposition: A | Discharge status: Home / Self Care | Payer: Medicare Other | Source: Ambulatory Visit | Attending: Internal Medicine | Admitting: Internal Medicine

## 2012-08-04 ENCOUNTER — Encounter (HOSPITAL_COMMUNITY): Payer: Self-pay | Admitting: *Deleted

## 2012-08-04 DIAGNOSIS — K644 Residual hemorrhoidal skin tags: Secondary | ICD-10-CM

## 2012-08-04 DIAGNOSIS — I10 Essential (primary) hypertension: Secondary | ICD-10-CM | POA: Insufficient documentation

## 2012-08-04 DIAGNOSIS — K5730 Diverticulosis of large intestine without perforation or abscess without bleeding: Secondary | ICD-10-CM | POA: Insufficient documentation

## 2012-08-04 DIAGNOSIS — Z1211 Encounter for screening for malignant neoplasm of colon: Secondary | ICD-10-CM

## 2012-08-04 DIAGNOSIS — Z800 Family history of malignant neoplasm of digestive organs: Secondary | ICD-10-CM | POA: Insufficient documentation

## 2012-08-04 HISTORY — PX: COLONOSCOPY: SHX5424

## 2012-08-04 SURGERY — COLONOSCOPY
Anesthesia: Moderate Sedation

## 2012-08-04 MED ORDER — SODIUM CHLORIDE 0.9 % IV SOLN
INTRAVENOUS | Status: DC
  Administered 2012-08-04: 10:00:00 via INTRAVENOUS

## 2012-08-04 MED ORDER — MEPERIDINE HCL 50 MG/ML IJ SOLN
INTRAMUSCULAR | Status: DC | PRN
  Administered 2012-08-04 (×2): 25 mg via INTRAVENOUS

## 2012-08-04 MED ORDER — MIDAZOLAM HCL 5 MG/5ML IJ SOLN
INTRAMUSCULAR | Status: DC | PRN
  Administered 2012-08-04 (×2): 2 mg via INTRAVENOUS

## 2012-08-04 MED ORDER — STERILE WATER FOR IRRIGATION IR SOLN
Status: DC | PRN
  Administered 2012-08-04: 10:00:00

## 2012-08-04 MED ORDER — MIDAZOLAM HCL 5 MG/5ML IJ SOLN
INTRAMUSCULAR | Status: AC
  Filled 2012-08-04: qty 10

## 2012-08-04 MED ORDER — MEPERIDINE HCL 50 MG/ML IJ SOLN
INTRAMUSCULAR | Status: AC
  Filled 2012-08-04: qty 1

## 2012-08-04 NOTE — H&P (Signed)
[name] [name] is an 72 y.o. female.   Chief Complaint: Patient is in for colonoscopy. HPI: Patient is 72 year old Caucasian female comes in for screening colonoscopy last exam was 15-20 years ago. She denies abdominal pain and change in bowel habits family history significant for colon carcinoma in maternal grandmother was in her 61s. Patient has been off Coumadin for  few days  Past Medical History  Diagnosis Date  . Allergic rhinitis   . 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 Hyperlipidemia   . Essential hypertension, benign   . Anxiety   . Depression   . Lumbar disc disease   . OSA (obstructive sleep apnea)   . Atrial flutter     Past Surgical History  Procedure Laterality Date  . Cataract extraction    . Lasik      Both eyes  . Total hip arthroplasty  2010    Right  . Anterior fusion lumbar spine      Family History  Problem Relation Age of Onset  . Cancer Mother   . Heart attack Father   . Colon cancer Other    Social History:  reports that she has never smoked. She has never used smokeless tobacco. She reports that she does not drink alcohol or use illicit drugs.  Allergies:  Allergies  Allergen Reactions  . Bupropion Hcl Other (See Comments)    Suicidal Thoughts.   . Escitalopram Oxalate Other (See Comments)    Suicidal Thoughts.   . Fluticasone-Salmeterol Other (See Comments)    Caused patient to go into Afib.   Marland Kitchen Lisinopril   . Serevent Other (See Comments)    Caused patient to go into Afib  . Amoxicillin Rash  . Macrodantin (Nitrofurantoin) Rash  . Penicillins Rash    Medications Prior to Admission  Medication Sig Dispense Refill  . albuterol (PROVENTIL) (2.5 MG/3ML) 0.083% nebulizer solution Take 2.5 mg by nebulization every 6 (six) hours as needed for wheezing.       Marland Kitchen antiseptic oral rinse (BIOTENE) LIQD 15 mLs by Mouth Rinse route as needed (Dry Mouth).       . bethanechol (URECHOLINE) 10 MG tablet Take 10  mg by mouth 2 (two) times daily.        Marland Kitchen BIOTIN 5000 PO Take 1 tablet by mouth daily.        . budesonide (PULMICORT) 0.5 MG/2ML nebulizer solution Take 0.5 mg by nebulization daily.      . Calcium Carbonate-Vit D-Min 600-400 MG-UNIT TABS Take 2 tablets by mouth daily.       Marland Kitchen dexlansoprazole (DEXILANT) 60 MG capsule Take 60 mg by mouth daily.      Marland Kitchen docusate-casanthranol (PERICOLACE) 100-30 MG per capsule Take 1 capsule by mouth daily as needed for constipation.       Marland Kitchen erythromycin (ERYPED 400) 400 MG/5ML suspension Take 2 tablets 1 hour before dental appt.       . flecainide (TAMBOCOR) 100 MG tablet Take 1 tablet (100 mg total) by mouth 2 (two) times daily.  60 tablet  6  . fluticasone (FLONASE) 50 MCG/ACT nasal spray Place 2 sprays into the nose daily as needed for allergies.      . peg 3350 powder (MOVIPREP) 100 G SOLR Take 1 kit (100 g total) by mouth once.  1 kit  0  . SPIRIVA HANDIHALER 18 MCG inhalation capsule Place 18 mcg into inhaler and inhale as needed (Shortness of Breath).       Marland Kitchen  valsartan-hydrochlorothiazide (DIOVAN-HCT) 320-25 MG per tablet Take 1 tablet by mouth daily.      Marland Kitchen diltiazem (CARDIZEM CD) 120 MG 24 hr capsule Take 1 capsule (120 mg total) by mouth daily.  30 capsule  6  . diltiazem (CARDIZEM) 30 MG tablet Take 30 mg by mouth 4 (four) times daily as needed (If patient goes into AFIB).       Marland Kitchen omeprazole (PRILOSEC) 20 MG capsule Take 1 capsule by mouth daily. Will start once current supply of dexilant is completed      . warfarin (COUMADIN) 5 MG tablet Take 2.5-5 mg by mouth daily. Takes 2.5 on Mon and Fri. Takes 5 mg on all other days        No results found for this or any previous visit (from the past 48 hour(s)). No results found.  ROS  Blood pressure 137/60, temperature 97.4 F (36.3 C), temperature source Oral, resp. rate 20, height 5' 2.5" (1.588 m), weight 200 lb (90.719 kg), SpO2 99.00%. Physical Exam  Constitutional: She appears well-developed and  well-nourished.  HENT:  Mouth/Throat: Oropharynx is clear and moist.  Eyes: Conjunctivae are normal. No scleral icterus.  Neck: No thyromegaly present.  Cardiovascular: Normal rate, regular rhythm and normal heart sounds.   No murmur heard. Respiratory: Effort normal and breath sounds normal.  GI: Soft. She exhibits no distension. There is no tenderness.  Musculoskeletal: She exhibits no edema.  Lymphadenopathy:    She has no cervical adenopathy.  Neurological: She is alert.  Skin: Skin is warm and dry.     Assessment/Plan Average risk screening colonoscopy.  REHMAN,NAJEEB U 08/04/2012, 10:10 AM

## 2012-08-04 NOTE — Op Note (Signed)
COLONOSCOPY PROCEDURE REPORT  PATIENT:  [name] [name]  MR#:  [id number] Birthdate:  02-20-1941, 72 y.o., female Endoscopist:  Dr. Malissa Hippo, MD Referred By:  Dr. Kirstie Peri, MD Procedure Date: 08/04/2012  Procedure:   Colonoscopy  Indications:  Patient is 72 year old Caucasian female was undergoing average risk screening colonoscopy. Her last exam was about over 15 years ago.  Informed Consent:  The procedure and risks were reviewed with the patient and informed consent was obtained.  Medications:  Demerol 50 mg IV Versed 5 mg IV  Description of procedure:  After a digital rectal exam was performed, that colonoscope was advanced from the anus through the rectum and colon to the area of the cecum, ileocecal valve and appendiceal orifice. The cecum was deeply intubated. These structures were well-seen and photographed for the record. From the level of the cecum and ileocecal valve, the scope was slowly and cautiously withdrawn. The mucosal surfaces were carefully surveyed utilizing scope tip to flexion to facilitate fold flattening as needed. The scope was pulled down into the rectum where a thorough exam including retroflexion was performed.  Findings:   Prep satisfactory. Few small diverticula noted at sigmoid colon. Normal rectal mucosa. Small hemorrhoids below the dentate line.   Therapeutic/Diagnostic Maneuvers Performed:  None  Complications:  None  Cecal Withdrawal Time:  12 minutes  Impression:  Examination performed to cecum. Few small diverticula at sigmoid colon. Small external hemorrhoids.  Recommendations:  Standard instructions given. Patient will resume warfarin at usual dose and get INR checked in 7-10 days. Next screening exam in 10 years.  Mayerli Kirst U  08/04/2012 10:56 AM  CC: Dr. Kirstie Peri, MD & Dr. Bonnetta Barry ref. provider found

## 2012-08-08 ENCOUNTER — Encounter (HOSPITAL_COMMUNITY): Payer: Self-pay | Admitting: Internal Medicine

## 2012-09-26 ENCOUNTER — Other Ambulatory Visit: Payer: Self-pay | Admitting: Internal Medicine

## 2012-09-29 ENCOUNTER — Telehealth: Payer: Self-pay | Admitting: Internal Medicine

## 2012-09-29 NOTE — Telephone Encounter (Signed)
I spoke with pt. She stated she scheduled an appt with CDY next with Dr. Maple Hudson wants to discuss this with him then. She did not want to discuss this with me right now. She stated she told scheduler that made her appt not to take a message. Will sign off message.

## 2012-10-02 ENCOUNTER — Other Ambulatory Visit: Payer: Self-pay | Admitting: Cardiology

## 2012-10-03 NOTE — Telephone Encounter (Signed)
Medication sent via escribe for flecainide (TAMBOCOR) 100 MG tablet  FLECAINIDE 100MG 

## 2012-10-04 ENCOUNTER — Ambulatory Visit (INDEPENDENT_AMBULATORY_CARE_PROVIDER_SITE_OTHER): Payer: Medicare Other | Admitting: Internal Medicine

## 2012-10-04 ENCOUNTER — Encounter: Payer: Self-pay | Admitting: Internal Medicine

## 2012-10-04 VITALS — BP 126/62 | HR 50 | Ht 64.0 in | Wt 196.3 lb

## 2012-10-04 DIAGNOSIS — G4733 Obstructive sleep apnea (adult) (pediatric): Secondary | ICD-10-CM

## 2012-10-04 DIAGNOSIS — J309 Allergic rhinitis, unspecified: Secondary | ICD-10-CM

## 2012-10-04 DIAGNOSIS — J3089 Other allergic rhinitis: Secondary | ICD-10-CM

## 2012-10-04 DIAGNOSIS — J302 Other seasonal allergic rhinitis: Secondary | ICD-10-CM

## 2012-10-04 DIAGNOSIS — J4520 Mild intermittent asthma, uncomplicated: Secondary | ICD-10-CM

## 2012-10-04 DIAGNOSIS — J45909 Unspecified asthma, uncomplicated: Secondary | ICD-10-CM

## 2012-10-04 MED ORDER — FLUTICASONE PROPIONATE 50 MCG/ACT NA SUSP: 2.0000 | Freq: Every day | NASAL | Status: DC | PRN

## 2012-10-04 MED ORDER — METHYLPREDNISOLONE ACETATE 80 MG/ML IJ SUSP
80.0000 mg | Freq: Once | INTRAMUSCULAR | Status: AC
  Administered 2012-10-05: 80 mg via INTRAMUSCULAR

## 2012-10-04 MED ORDER — PHENYLEPHRINE HCL 1 % NA SOLN
3.0000 [drp] | Freq: Once | NASAL | Status: AC
  Administered 2012-10-05: 3 [drp] via NASAL

## 2012-10-04 MED ORDER — LEVALBUTEROL TARTRATE 45 MCG/ACT IN AERO: 1.0000 | INHALATION_SPRAY | RESPIRATORY_TRACT | Status: DC | PRN

## 2012-10-04 NOTE — Patient Instructions (Addendum)
Refill script sent for flonase/ fluticasone nasal spray       Nasacort could be tried an an Control and instrumentation engineer sent to try Xopenex HFA/ levalbuterol as a less stimulating bronchodilator inhaler, compared with albuterol  Neb neo nasal  Depo 80

## 2012-10-04 NOTE — Progress Notes (Signed)
05/06/11- 70 yoF never smoker followed for OSA, allergic rhinitis, complicated by AFib/coumadin LOV 05/05/10 She has had more persistent problems with nasal stuffiness and congestion and postnasal drip with global headache. This interferes with her ability to wear CPAP comfortably. She is using a fullface mask from [place] but she doesn't know her pressure. Ongoing problem with dryness of the eyes and nose. Since last here has had hip and back surgeries, treatment for atrial fibrillation with Coumadin.  10/04/12- 71 yoF never smoker followed for OSA, allergic rhinitis, complicated by AFib/coumadin FOLLOWS FOR: request new rx for flonase.  also c/o sinus congestion, PND, increased SOB, wheezing, throat congestion, prod cough in the AM with thick green mucus w/ the humidity CPAP 10/ [employer] Ran out of Flonase-increased nasal congestion. Albuterol causes tremor. AFib is controlled. Since last here had both hips replaced and lumbar spine surgery. No respiratory complications during surgeries.  ROS-see HPI Constitutional:   No-   weight loss, night sweats, fevers, chills, fatigue, lassitude. HEENT:   No-  headaches, difficulty swallowing, tooth/dental problems, sore throat,       No-  sneezing, itching,  +ear ache,  +nasal congestion, post nasal drip,  CV:  No-   chest pain, orthopnea, PND, swelling in lower extremities, anasarca, dizziness, palpitations Resp: +  shortness of breath with exertion or at rest.              +   productive cough,  No non-productive cough,  No- coughing up of blood.              +  change in color of mucus.  + wheezing.   Skin: No-   rash or lesions. GI:  No-   heartburn, indigestion, abdominal pain, nausea, vomiting, GU:  MS:  No-   joint pain or swelling. . Neuro-     nothing unusual Psych:  No- change in mood or affect. No depression or anxiety.  No memory loss.   OBJ- Physical Exam General- Alert, Oriented, Affect-appropriate, Distress- none  acute Skin- rash-none, lesions- none, excoriation- none Lymphadenopathy- none Head- atraumatic            Eyes- Gross vision intact, PERRLA, conjunctivae and secretions clear            Ears- Hearing, canals-normal            Nose- +turbinate edema, no-Septal dev, mucus, polyps, erosion, perforation             Throat- Mallampati III , mucosa clear , drainage- none, tonsils- atrophic Neck- flexible , trachea midline, no stridor , thyroid nl, carotid no bruit Chest - symmetrical excursion , unlabored           Heart/CV- RRR to palpation now , no murmur , no gallop  , no rub, nl s1 s2                           - JVD- none , edema- none, stasis changes- none, varices- none           Lung- clear to P&A, wheeze- none, cough- none , dullness-none, rub- none           Chest wall-  Abd- Br/ Gen/ Rectal- Not done, not indicated Extrem- cyanosis- none, clubbing, none, atrophy- none, strength- nl Neuro- grossly intact to observation

## 2012-10-05 DIAGNOSIS — J309 Allergic rhinitis, unspecified: Secondary | ICD-10-CM

## 2012-10-05 MED ORDER — METHYLPREDNISOLONE ACETATE 80 MG/ML IJ SUSP: 80.0000 mg | Freq: Once | INTRAMUSCULAR | Status: DC

## 2012-10-05 MED ORDER — PHENYLEPHRINE HCL 1 % NA SOLN: 3.0000 [drp] | Freq: Once | NASAL | Status: DC

## 2012-10-18 DIAGNOSIS — J3089 Other allergic rhinitis: Secondary | ICD-10-CM | POA: Insufficient documentation

## 2012-10-18 DIAGNOSIS — J302 Other seasonal allergic rhinitis: Secondary | ICD-10-CM | POA: Insufficient documentation

## 2012-10-18 DIAGNOSIS — J4520 Mild intermittent asthma, uncomplicated: Secondary | ICD-10-CM | POA: Insufficient documentation

## 2012-10-18 NOTE — Assessment & Plan Note (Signed)
Good compliance and control. No changes necessary.

## 2012-10-18 NOTE — Assessment & Plan Note (Signed)
Doesn't tolerate stimulant albuterol well. Plan-try Xopenex HFA

## 2012-10-18 NOTE — Assessment & Plan Note (Signed)
Plan-refill Flonase with discussion 

## 2012-11-14 ENCOUNTER — Other Ambulatory Visit: Payer: Self-pay | Admitting: Physician Assistant

## 2012-12-26 ENCOUNTER — Telehealth: Payer: Self-pay | Admitting: Cardiology

## 2012-12-26 NOTE — Telephone Encounter (Signed)
Patient called and states that she saw Dr. Sherryll Burger  9-25  for coumdin check . He checked her HR which was 38. Dr. Sherryll Burger is wanting To take patient off of Cardizem 120 mg.

## 2012-12-26 NOTE — Telephone Encounter (Signed)
Was in Dr. Margaretmary Eddy office today and her HR was low

## 2012-12-27 NOTE — Telephone Encounter (Signed)
Patient informed and verbalized understanding of plan. Patient requested that nurse leave information about upcoming appointment 01/06/13 @11 :20 am. Message left on machine.

## 2012-12-27 NOTE — Telephone Encounter (Signed)
Have her hold the Cardizem CD for now and schedule a followup office visit with me in the next 2 weeks.

## 2012-12-28 ENCOUNTER — Encounter: Payer: Self-pay | Admitting: Cardiology

## 2012-12-28 ENCOUNTER — Ambulatory Visit (INDEPENDENT_AMBULATORY_CARE_PROVIDER_SITE_OTHER): Payer: Medicare Other | Admitting: Cardiology

## 2012-12-28 VITALS — BP 146/77 | HR 64 | Ht 62.5 in | Wt 202.0 lb

## 2012-12-28 DIAGNOSIS — I4891 Unspecified atrial fibrillation: Secondary | ICD-10-CM

## 2012-12-28 DIAGNOSIS — I495 Sick sinus syndrome: Secondary | ICD-10-CM

## 2012-12-28 NOTE — Patient Instructions (Addendum)
   Resume your Cardizem CD as you were taking previously Continue all other medications.   Follow up in  1 month

## 2012-12-28 NOTE — Assessment & Plan Note (Signed)
Continuing to monitor. No clear indication for pacemaker at this time.

## 2012-12-28 NOTE — Assessment & Plan Note (Signed)
For now we are going to continue her current regimen including flecainide and Cardizem CD 120 mg daily. She will check her heart rate and blood pressure more regularly at home and I will see her back over the next month.

## 2012-12-28 NOTE — Progress Notes (Signed)
Clinical Summary [name] [name] is a 72 y.o.female last seen in may of this year. Recent phone note indicated documentation of bradycardia at 38 during Coumadin check at Dr. Margaretmary Eddy office. Cardizem CD 120 mg was discontinued. She held this medicine for only one night, has had some problems of palpitations, presumably recurring atrial fibrillation. Also increased blood pressure. She tells me today that she was asymptomatic with her bradycardia the other day. She has had no dizziness or syncope. She continues to exercise regularly and generally feels well.  Heart rate was 48 and sinus rhythm at the last visit. Her last major bouts of atrial fibrillation occurred back in the spring.  Today we discussed options of continuing her Cardizem CD, checking her heart rate and blood pressure a bit more regularly at home, versus switching to a different formulation. For now we elected to continue the Cardizem CD.  Allergies  Allergen Reactions  . Bupropion Hcl Other (See Comments)    Suicidal Thoughts.   . Escitalopram Oxalate Other (See Comments)    Suicidal Thoughts.   . Fluticasone-Salmeterol Other (See Comments)    Caused patient to go into Afib.   Marland Kitchen Lisinopril   . Serevent Other (See Comments)    Caused patient to go into Afib  . Amoxicillin Rash  . Macrodantin [Nitrofurantoin] Rash  . Penicillins Rash    Current Outpatient Prescriptions  Medication Sig Dispense Refill  . albuterol (PROVENTIL) (2.5 MG/3ML) 0.083% nebulizer solution Take 2.5 mg by nebulization every 6 (six) hours as needed for wheezing.       Marland Kitchen antiseptic oral rinse (BIOTENE) LIQD 15 mLs by Mouth Rinse route as needed (Dry Mouth).       Marland Kitchen BIOTIN 5000 PO Take 1 tablet by mouth daily.        . budesonide (PULMICORT) 0.5 MG/2ML nebulizer solution Take 0.5 mg by nebulization daily.      . Calcium Carbonate-Vit D-Min 600-400 MG-UNIT TABS Take 2 tablets by mouth daily.       Marland Kitchen diltiazem (CARDIZEM CD) 120 MG 24 hr capsule Take 1  capsule (120 mg total) by mouth daily.  30 capsule  6  . diltiazem (CARDIZEM) 30 MG tablet Take 30 mg by mouth 4 (four) times daily as needed (If patient goes into AFIB).       Marland Kitchen docusate-casanthranol (PERICOLACE) 100-30 MG per capsule Take 1 capsule by mouth daily as needed for constipation.       Marland Kitchen erythromycin (ERYPED 400) 400 MG/5ML suspension Take 2 tablets 1 hour before dental appt.       . flecainide (TAMBOCOR) 100 MG tablet TAKE 1 TABLET BY MOUTH TWICE DAILY  60 tablet  6  . fluticasone (FLONASE) 50 MCG/ACT nasal spray Place 2 sprays into the nose daily as needed for allergies.  16 g  prn  . levalbuterol (XOPENEX HFA) 45 MCG/ACT inhaler Inhale 1-2 puffs into the lungs every 4 (four) hours as needed for wheezing or shortness of breath.  1 Inhaler  12  . omeprazole (PRILOSEC) 20 MG capsule Take 1 capsule by mouth 2 (two) times daily.       Marland Kitchen SPIRIVA HANDIHALER 18 MCG inhalation capsule Place 18 mcg into inhaler and inhale as needed (Shortness of Breath).       . valsartan-hydrochlorothiazide (DIOVAN-HCT) 320-25 MG per tablet Take 1 tablet by mouth daily.      Marland Kitchen warfarin (COUMADIN) 5 MG tablet Take 2.5-5 mg by mouth daily. Takes 2.5 on Mon and Fri.  Takes 5 mg on all other days       No current facility-administered medications for this visit.    Past Medical History  Diagnosis Date  . Allergic rhinitis   . 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 Hyperlipidemia   . Essential hypertension, benign   . Anxiety   . Depression   . Lumbar disc disease   . OSA (obstructive sleep apnea)   . Atrial flutter     Social History Ms. Romagnoli reports that she has never smoked. She has never used smokeless tobacco. Ms. Tiegs reports that she does not drink alcohol.  Review of Systems Negative except as outlined.  Physical Examination Filed Vitals:   12/28/12 0841  BP: 146/77  Pulse: 64   Filed Weights   12/28/12 0841  Weight: 202 lb (91.627 kg)     Overweight woman in no acute distress.  HEENT: Conjunctiva and lids normal, oropharynx with moist mucosa.  Neck: Supple, no elevated JVP or bruits.  Lungs: Clear to auscultation, diminished, nonlabored.  Cardiac: Regular rate and rhythm, distant heart sounds, no significant systolic murmur.  Abdomen: Soft, nontender, bowel sounds present.  Extremities: No pitting edema.    Problem List and Plan   ATRIAL FIBRILLATION For now we are going to continue her current regimen including flecainide and Cardizem CD 120 mg daily. She will check her heart rate and blood pressure more regularly at home and I will see her back over the next month.  BRADYCARDIA-TACHYCARDIA SYNDROME Continuing to monitor. No clear indication for pacemaker at this time.    [name], M.D., F.A.C.C.

## 2013-01-06 ENCOUNTER — Ambulatory Visit: Payer: Medicare Other | Admitting: Cardiology

## 2013-01-30 ENCOUNTER — Ambulatory Visit: Payer: Medicare Other | Admitting: Cardiology

## 2013-01-31 ENCOUNTER — Ambulatory Visit: Payer: Medicare Other | Admitting: Cardiology

## 2013-02-02 ENCOUNTER — Ambulatory Visit (INDEPENDENT_AMBULATORY_CARE_PROVIDER_SITE_OTHER): Payer: Medicare Other | Admitting: Cardiology

## 2013-02-02 ENCOUNTER — Encounter: Payer: Self-pay | Admitting: Cardiology

## 2013-02-02 VITALS — BP 125/74 | HR 45 | Ht 62.5 in | Wt 209.0 lb

## 2013-02-02 DIAGNOSIS — I4891 Unspecified atrial fibrillation: Secondary | ICD-10-CM

## 2013-02-02 DIAGNOSIS — I495 Sick sinus syndrome: Secondary | ICD-10-CM

## 2013-02-02 NOTE — Assessment & Plan Note (Signed)
No progressive palpitations. Continue flecainide, Coumadin, and low-dose Cardizem CD.

## 2013-02-02 NOTE — Progress Notes (Signed)
Clinical Summary [name] [name] is a 72 y.o.female last seen in early October. She reports no significant changes in stamina, no dizziness or syncope. No significant palpitations.  Continues to show bradycardia, however does not seem to be functionally limiting at this time. We have discussed continuing observation, will not stop Cardizem CD as she has had breakthrough atrial fibrillation more regularly when off rate control medications.   Allergies  Allergen Reactions  . Bupropion Hcl Other (See Comments)    Suicidal Thoughts.   . Escitalopram Oxalate Other (See Comments)    Suicidal Thoughts.   . Fluticasone-Salmeterol Other (See Comments)    Caused patient to go into Afib.   Marland Kitchen Lisinopril   . Serevent Other (See Comments)    Caused patient to go into Afib  . Amoxicillin Rash  . Macrodantin [Nitrofurantoin] Rash  . Penicillins Rash    Current Outpatient Prescriptions  Medication Sig Dispense Refill  . albuterol (PROVENTIL) (2.5 MG/3ML) 0.083% nebulizer solution Take 2.5 mg by nebulization every 6 (six) hours as needed for wheezing.       Marland Kitchen antiseptic oral rinse (BIOTENE) LIQD 15 mLs by Mouth Rinse route as needed (Dry Mouth).       Marland Kitchen azelastine (ASTELIN) 137 MCG/SPRAY nasal spray Place 2 sprays into the nose 2 (two) times daily.       Marland Kitchen BIOTIN 5000 PO Take 1 tablet by mouth daily.        . budesonide (PULMICORT) 0.5 MG/2ML nebulizer solution Take 0.5 mg by nebulization daily.      . Calcium Carbonate-Vit D-Min 600-400 MG-UNIT TABS Take 2 tablets by mouth daily.       Marland Kitchen diltiazem (CARDIZEM CD) 120 MG 24 hr capsule Take 1 capsule (120 mg total) by mouth daily.  30 capsule  6  . diltiazem (CARDIZEM) 30 MG tablet Take 30 mg by mouth 4 (four) times daily as needed (If patient goes into AFIB).       Marland Kitchen docusate-casanthranol (PERICOLACE) 100-30 MG per capsule Take 1 capsule by mouth daily as needed for constipation.       Marland Kitchen erythromycin (ERYPED 400) 400 MG/5ML suspension Take 2 tablets  1 hour before dental appt.       . flecainide (TAMBOCOR) 100 MG tablet TAKE 1 TABLET BY MOUTH TWICE DAILY  60 tablet  6  . fluticasone (FLONASE) 50 MCG/ACT nasal spray Place 2 sprays into the nose daily as needed for allergies.  16 g  prn  . levalbuterol (XOPENEX HFA) 45 MCG/ACT inhaler Inhale 1-2 puffs into the lungs every 4 (four) hours as needed for wheezing or shortness of breath.  1 Inhaler  12  . omeprazole (PRILOSEC) 20 MG capsule Take 1 capsule by mouth 2 (two) times daily.       Marland Kitchen SPIRIVA HANDIHALER 18 MCG inhalation capsule Place 18 mcg into inhaler and inhale as needed (Shortness of Breath).       . valsartan-hydrochlorothiazide (DIOVAN-HCT) 320-25 MG per tablet Take 1 tablet by mouth daily.      Marland Kitchen warfarin (COUMADIN) 5 MG tablet Take 2.5-5 mg by mouth daily. Takes 2.5 on Mon and Fri. Takes 5 mg on all other days       No current facility-administered medications for this visit.    Past Medical History  Diagnosis Date  . Allergic rhinitis   . 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 Hyperlipidemia   . Essential hypertension, benign   . Anxiety   .  Depression   . Lumbar disc disease   . OSA (obstructive sleep apnea)   . Atrial flutter     Social History Ms. Teodoro reports that she has never smoked. She has never used smokeless tobacco. Ms. Momon reports that she does not drink alcohol.  Review of Systems Negative except as outlined.  Physical Examination Filed Vitals:   02/02/13 0939  BP: 125/74  Pulse: 45   Filed Weights   02/02/13 0939  Weight: 209 lb (94.802 kg)    Overweight woman in no acute distress.  HEENT: Conjunctiva and lids normal, oropharynx with moist mucosa.  Neck: Supple, no elevated JVP or bruits.  Lungs: Clear to auscultation, diminished, nonlabored.  Cardiac: Regular rate and rhythm, distant heart sounds, no significant systolic murmur.  Abdomen: Soft, nontender, bowel sounds present.  Extremities: No  pitting edema.    Problem List and Plan   BRADYCARDIA-TACHYCARDIA SYNDROME Continue observation. No change to current regimen.  ATRIAL FIBRILLATION No progressive palpitations. Continue flecainide, Coumadin, and low-dose Cardizem CD.    [name], M.D., F.A.C.C.

## 2013-02-02 NOTE — Patient Instructions (Signed)
Continue all current medications. Your physician wants you to follow up in: 6 months.  You will receive a reminder letter in the mail one-two months in advance.  If you don't receive a letter, please call our office to schedule the follow up appointment   

## 2013-02-02 NOTE — Assessment & Plan Note (Signed)
Continue observation. No change to current regimen.

## 2013-02-09 ENCOUNTER — Ambulatory Visit: Payer: Medicare Other | Admitting: Cardiology

## 2013-02-20 ENCOUNTER — Other Ambulatory Visit: Payer: Self-pay | Admitting: Cardiology

## 2013-03-10 ENCOUNTER — Encounter: Payer: Self-pay | Admitting: Cardiovascular Disease

## 2013-03-14 ENCOUNTER — Ambulatory Visit (INDEPENDENT_AMBULATORY_CARE_PROVIDER_SITE_OTHER): Payer: Medicare Other | Admitting: *Deleted

## 2013-03-14 ENCOUNTER — Encounter: Payer: Self-pay | Admitting: *Deleted

## 2013-03-14 ENCOUNTER — Telehealth: Payer: Self-pay | Admitting: Cardiology

## 2013-03-14 VITALS — BP 100/71 | HR 109 | Ht 62.5 in | Wt 206.0 lb

## 2013-03-14 DIAGNOSIS — I4891 Unspecified atrial fibrillation: Secondary | ICD-10-CM

## 2013-03-14 NOTE — Progress Notes (Signed)
Per Diona Browner, patient should take 50 mg of flecainide and if not any better and symptoms worsen, she needed to proceed to the ED.

## 2013-03-14 NOTE — Telephone Encounter (Signed)
[name] [name] called stating that she has been in atrial Fibrillation since 11pm last evening. States that she has taken 3 diltiazem (CARDIZEM) 30 MG tablet  Her heart rate is 110 as of now.

## 2013-03-14 NOTE — Progress Notes (Signed)
Patient presents to office for ekg per phone note. Sob and wheezing, and coughing currently on prednisone, antibiotic, breathing treatments, and mucinex prescribed by Dr. Sherryll Burger. Patient saw him yesterday for f/u. Patient said these symptoms started about 3 weeks ago. No chest pain. Patient c/o dizziness with coughing. Patient informed that she would be contacted after response received by [facility].

## 2013-03-14 NOTE — Progress Notes (Signed)
Patient informed and verbalizes understanding of plan. 

## 2013-03-14 NOTE — Telephone Encounter (Signed)
Nurse request from MD if patient could come to office for ekg only. Per MD, No that would be fine. If she is still in atrial fibrillation, it may be worth having her take an extra flecainide 50 mg dose today. Patient informed and is coming now.

## 2013-03-15 ENCOUNTER — Encounter: Payer: Self-pay | Admitting: Cardiology

## 2013-03-21 ENCOUNTER — Telehealth: Payer: Self-pay | Admitting: Cardiology

## 2013-03-21 NOTE — Telephone Encounter (Signed)
Appointment made for Friday, March 24, 2013 to see Dr. Purvis Sheffield. Patient was notified.

## 2013-03-21 NOTE — Telephone Encounter (Signed)
Patient was discharged from [facility] on 03-19-13. She was instructed to stop taking her Diltiazem. She needs a follow up as soon As we can get her in also.

## 2013-03-24 ENCOUNTER — Ambulatory Visit (INDEPENDENT_AMBULATORY_CARE_PROVIDER_SITE_OTHER): Payer: Medicare Other | Admitting: Cardiovascular Disease

## 2013-03-24 VITALS — BP 129/87 | HR 103 | Ht 62.5 in | Wt 203.0 lb

## 2013-03-24 DIAGNOSIS — I4891 Unspecified atrial fibrillation: Secondary | ICD-10-CM

## 2013-03-24 DIAGNOSIS — Z79899 Other long term (current) drug therapy: Secondary | ICD-10-CM

## 2013-03-24 DIAGNOSIS — Z7901 Long term (current) use of anticoagulants: Secondary | ICD-10-CM

## 2013-03-24 DIAGNOSIS — I4892 Unspecified atrial flutter: Secondary | ICD-10-CM

## 2013-03-24 DIAGNOSIS — I495 Sick sinus syndrome: Secondary | ICD-10-CM

## 2013-03-24 DIAGNOSIS — J209 Acute bronchitis, unspecified: Secondary | ICD-10-CM

## 2013-03-24 MED ORDER — DILTIAZEM HCL 60 MG PO TABS: 60.0000 mg | ORAL_TABLET | Freq: Three times a day (TID) | ORAL | Status: DC

## 2013-03-24 NOTE — Patient Instructions (Addendum)
   Hold long acting Diltiazem CD 120mg  for now  Change to Diltiazem 60mg  three times per day (short acting)  Continue all other medications.   Follow up in a couple weeks with Dr. Diona Browner - will call with appointment

## 2013-03-24 NOTE — Progress Notes (Signed)
Patient ID: [name] [name], female   DOB: Jan 12, 1941, 72 y.o.   MRN: [id number]      SUBJECTIVE: Patient was last seen in early November by Dr. Diona Browner, and was deemed stable at that time. She has a h/o atrial fibrillation and tachy-brady syndrome. She reportedly had some shortness of breath and wheezing, so she contacted our office and it was recommended she be evaluated in the ED.  She was hospitalized for acute bronchitis and had both atrial fibrillation and atrial flutter with RVR. She is on her third day of azithromycin and has 2 more days of prednisone. She was discharged with an increase in diltiazem from 120 to 240 mg daily in divided doses. She has only been taking 120 mg daily thus far. She is also on flecainide. She was discharged on 12/21. Of note , she required cardioversion on 06/25/11.  She said her HR was as high as 148 bpm during her hospitalization. Since being discharged, she has felt weak, tired, and short of breath. Her HR has been 100-110 bpm at home, and she says it's normally 57-65 bpm. She is hoping she can be cardioverted. She is chronically anticoagulated with warfarin.  BUN/creatinine were 22/1.22 respectively, GFR 41 ml/min.   Allergies  Allergen Reactions  . Bupropion Hcl Other (See Comments)    Suicidal Thoughts.   . Escitalopram Oxalate Other (See Comments)    Suicidal Thoughts.   . Fluticasone-Salmeterol Other (See Comments)    Caused patient to go into Afib.   Marland Kitchen Lisinopril   . Serevent Other (See Comments)    Caused patient to go into Afib  . Amoxicillin Rash  . Macrodantin [Nitrofurantoin] Rash  . Penicillins Rash    Current Outpatient Prescriptions  Medication Sig Dispense Refill  . albuterol (PROVENTIL) (2.5 MG/3ML) 0.083% nebulizer solution Take 2.5 mg by nebulization every 6 (six) hours as needed for wheezing.       Marland Kitchen antiseptic oral rinse (BIOTENE) LIQD 15 mLs by Mouth Rinse route as needed (Dry Mouth).       Marland Kitchen azelastine (ASTELIN) 137  MCG/SPRAY nasal spray Place 2 sprays into the nose 2 (two) times daily.       Marland Kitchen azithromycin (ZITHROMAX) 500 MG tablet Take 500 mg by mouth daily.       Marland Kitchen BIOTIN 5000 PO Take 1 tablet by mouth daily.        . budesonide (PULMICORT) 0.5 MG/2ML nebulizer solution Take 0.5 mg by nebulization daily.      . Calcium Carbonate-Vit D-Min 600-400 MG-UNIT TABS Take 2 tablets by mouth daily.       . cefUROXime (CEFTIN) 500 MG tablet Take 500 mg by mouth 2 (two) times daily with a meal.       . diltiazem (CARDIZEM CD) 120 MG 24 hr capsule TAKE 1 CAPSULE BY MOUTH TWICE DAILY      . diltiazem (CARDIZEM) 30 MG tablet Take 30 mg by mouth 4 (four) times daily as needed (If patient goes into AFIB).       Marland Kitchen docusate-casanthranol (PERICOLACE) 100-30 MG per capsule Take 1 capsule by mouth daily as needed for constipation.       Marland Kitchen erythromycin (ERYPED 400) 400 MG/5ML suspension Take 2 tablets 1 hour before dental appt.       . flecainide (TAMBOCOR) 100 MG tablet TAKE 1 TABLET BY MOUTH TWICE DAILY  60 tablet  6  . fluticasone (FLONASE) 50 MCG/ACT nasal spray Place 2 sprays into the nose daily as needed  for allergies.  16 g  prn  . guaiFENesin (MUCINEX) 600 MG 12 hr tablet Take 1,200 mg by mouth 2 (two) times daily.      Marland Kitchen levalbuterol (XOPENEX HFA) 45 MCG/ACT inhaler Inhale 1-2 puffs into the lungs every 4 (four) hours as needed for wheezing or shortness of breath.  1 Inhaler  12  . methylPREDNIsolone (MEDROL DOSPACK) 4 MG tablet Take 4 mg by mouth as directed.       Marland Kitchen omeprazole (PRILOSEC) 20 MG capsule Take 1 capsule by mouth 2 (two) times daily.       Marland Kitchen SPIRIVA HANDIHALER 18 MCG inhalation capsule Place 18 mcg into inhaler and inhale as needed (Shortness of Breath).       . valsartan-hydrochlorothiazide (DIOVAN-HCT) 320-25 MG per tablet Take 1 tablet by mouth daily.      Marland Kitchen warfarin (COUMADIN) 5 MG tablet Take 2.5-5 mg by mouth daily. Takes 2.5 on Mon and Fri. Takes 5 mg on all other days       No current  facility-administered medications for this visit.    Past Medical History  Diagnosis Date  . Allergic rhinitis   . 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 Hyperlipidemia   . Essential hypertension, benign   . Anxiety   . Depression   . Lumbar disc disease   . OSA (obstructive sleep apnea)   . Atrial flutter     Past Surgical History  Procedure Laterality Date  . Cataract extraction    . Lasik      Both eyes  . Total hip arthroplasty  2010    Right  . Anterior fusion lumbar spine    . Colonoscopy N/A 08/04/2012    Procedure: COLONOSCOPY;  Surgeon: Malissa Hippo, MD;  Location: [facility];  Service: Endoscopy;  Laterality: N/A;  730-rescheduled to 1030 Ann notified pt    History   Social History  . Marital Status: Single    Spouse Name: N/A    Number of Children: N/A  . Years of Education: N/A   Occupational History  . Retired: Engineer, site    Social History Main Topics  . Smoking status: Never Smoker   . Smokeless tobacco: Never Used  . Alcohol Use: No  . Drug Use: No  . Sexual Activity: Not on file   Other Topics Concern  . Not on file   Social History Narrative  . No narrative on file     Filed Vitals:   03/24/13 1409  BP: 129/87  Pulse: 103  Height: 5' 2.5" (1.588 m)  Weight: 203 lb (92.08 kg)    PHYSICAL EXAM General: NAD, obese Neck: No JVD, no thyromegaly or thyroid nodule.  Lungs: Diffuse expiratory wheezes bilaterally with diminished respiratory effort. CV: Nondisplaced PMI.  Heart irregular, HR 100-105 bpm, normal S1/S2, no murmur.  No peripheral edema.  No carotid bruit.  Normal pedal pulses.  Abdomen: Soft, nontender, no hepatosplenomegaly, no distention.  Neurologic: Alert and oriented x 3.  Psych: Normal affect. Extremities: No clubbing or cyanosis.   ECG: reviewed and available in electronic records.      ASSESSMENT AND PLAN: Atrial fibrillation/flutter She is quite symptomatic, but is  also still recovering from acute bronchitis. I would defer cardioversion until she has recovered from a respiratory standpoint, as she is likely to revert back to A Fib/flutter. I would favor changing her diltiazem regimen from 120 mg daily (what she is currently taking) to 60  mg tid.  I prefer she not be on long-acting diltiazem 240 mg daily (as recommended by her insurance company) given her propensity for bradycardia.  I will arrange for close follow up with Dr. Diona Browner for further management (medication titration vs cardioversion).   BRADYCARDIA-TACHYCARDIA SYNDROME  Will need to monitor for bradycardia given the increased dose of diltiazem, which I am changing from 120 bid (has not been taking) to 60 mg tid. I prefer she not be on long-acting diltiazem 240 mg daily given her propensity for bradycardia.     Prentice Docker, M.D., F.A.C.C.

## 2013-04-06 ENCOUNTER — Ambulatory Visit (INDEPENDENT_AMBULATORY_CARE_PROVIDER_SITE_OTHER): Payer: Medicare Other | Admitting: Cardiology

## 2013-04-06 ENCOUNTER — Encounter: Payer: Self-pay | Admitting: Cardiology

## 2013-04-06 VITALS — BP 124/80 | HR 81 | Ht 62.0 in | Wt 208.8 lb

## 2013-04-06 DIAGNOSIS — I495 Sick sinus syndrome: Secondary | ICD-10-CM

## 2013-04-06 DIAGNOSIS — I4892 Unspecified atrial flutter: Secondary | ICD-10-CM

## 2013-04-06 DIAGNOSIS — I10 Essential (primary) hypertension: Secondary | ICD-10-CM

## 2013-04-06 DIAGNOSIS — G4733 Obstructive sleep apnea (adult) (pediatric): Secondary | ICD-10-CM

## 2013-04-06 DIAGNOSIS — I4891 Unspecified atrial fibrillation: Secondary | ICD-10-CM

## 2013-04-06 NOTE — Assessment & Plan Note (Signed)
As noted above, this does present somewhat of a barrier to optimal management of her atrial arrhythmias in terms of rate control. We have discussed the possibility that eventually she may need a pacemaker.

## 2013-04-06 NOTE — Patient Instructions (Signed)
Your physician recommends that you schedule a follow-up appointment in: 2 months.  Your physician recommends that you continue on your current medications as directed. Please refer to the Current Medication list given to you today. Your physician has requested that you have an echocardiogram. Echocardiography is a painless test that uses sound waves to create images of your heart. It provides your doctor with information about the size and shape of your heart and how well your heart's chambers and valves are working. This procedure takes approximately one hour. There are no restrictions for this procedure. You have been referred to Dr. Rayann Heman.

## 2013-04-06 NOTE — Assessment & Plan Note (Signed)
Most recent episode onset looks to be in December in the setting of bronchitis which has resolved. She remains in atrial flutter although with variable response and better rate. At home she reports heart rate in the 90-110 range. She seems to be tolerating short acting Cardizem at 60 mg 3 times a day and otherwise continues on flecainide and Coumadin. Complicating her management is also sick sinus syndrome with significant bradycardia when in sinus rhythm. I think that the best option at this time is to have her see Dr. Rayann Heman for EP evaluation regarding potential ablation considerations and also whether she might ultimately benefit from device treatment for tachycardia-bradycardia syndrome.

## 2013-04-06 NOTE — Assessment & Plan Note (Signed)
Followed by Dr. Young. 

## 2013-04-06 NOTE — Progress Notes (Signed)
Clinical Summary [name] [name] is a 73 y.o.female patient of mine seen recently by Dr. Purvis Sheffield as an add-on to clinic in December 2014. History indicates that she had had an episode of acute bronchitis around that time and was noted to be in atrial flutter with rapid ventricular response. She was treated with steroids and antibiotics during hospitalization (also seen by Dr. Alene Mires with the [facility] cardiology group), and diltiazem CD dose had been increased to 120 mg twice daily. She was reporting a feeling of weakness and fatigue since discharge and had not returned to sinus rhythm.  Dr. Purvis Sheffield changed the Cardizem dose to short acting formulation at 60 mg 3 times a day. She has history of tachycardia-bradycardia syndrome, and had tolerated Cardizem CD at 120 mg daily, but heart rates were usually in the 50s, sometimes lower.  Today she comes in with ECG showing atrial flutter with variable conduction and heart rate 66 beats per minute. She continues on short acting Cardizem, flecainide, and Coumadin which is followed by Dr. Sherryll Burger (recent INR reported to be 4.5). She still feels fatigued, less sense of palpitations however. She has not been able to return to her typical water aerobics.  Today we discussed available options including considering a repeat cardioversion (last one in March 2013) versus considering referral for EP consultation regarding ablation options and also discussion as to whether she may need device treatment due to tachycardia-bradycardia syndrome.   Allergies  Allergen Reactions  . Bupropion Hcl Other (See Comments)    Suicidal Thoughts.   . Escitalopram Oxalate Other (See Comments)    Suicidal Thoughts.   . Fluticasone-Salmeterol Other (See Comments)    Caused patient to go into Afib.   Marland Kitchen Lisinopril   . Serevent Other (See Comments)    Caused patient to go into Afib  . Amoxicillin Rash  . Macrodantin [Nitrofurantoin] Rash  . Penicillins Rash    Current  Outpatient Prescriptions  Medication Sig Dispense Refill  . albuterol (PROVENTIL) (2.5 MG/3ML) 0.083% nebulizer solution Take 2.5 mg by nebulization every 6 (six) hours as needed for wheezing.       Marland Kitchen antiseptic oral rinse (BIOTENE) LIQD 15 mLs by Mouth Rinse route as needed (Dry Mouth).       Marland Kitchen azelastine (ASTELIN) 137 MCG/SPRAY nasal spray Place 2 sprays into the nose 2 (two) times daily.       Marland Kitchen BIOTIN 5000 PO Take 1 tablet by mouth daily.        . budesonide (PULMICORT) 0.5 MG/2ML nebulizer solution Take 0.5 mg by nebulization daily.      . Calcium Carbonate-Vit D-Min 600-400 MG-UNIT TABS Take 2 tablets by mouth daily.       Marland Kitchen diltiazem (CARDIZEM) 60 MG tablet Take 1 tablet (60 mg total) by mouth 3 (three) times daily.  90 tablet  6  . docusate-casanthranol (PERICOLACE) 100-30 MG per capsule Take 1 capsule by mouth daily as needed for constipation.       . flecainide (TAMBOCOR) 100 MG tablet TAKE 1 TABLET BY MOUTH TWICE DAILY  60 tablet  6  . fluticasone (FLONASE) 50 MCG/ACT nasal spray Place 2 sprays into the nose daily as needed for allergies.  16 g  prn  . levalbuterol (XOPENEX HFA) 45 MCG/ACT inhaler Inhale 1-2 puffs into the lungs every 4 (four) hours as needed for wheezing or shortness of breath.  1 Inhaler  12  . omeprazole (PRILOSEC) 20 MG capsule Take 1 capsule by mouth 2 (two)  times daily.       Marland Kitchen SPIRIVA HANDIHALER 18 MCG inhalation capsule Place 18 mcg into inhaler and inhale as needed (Shortness of Breath).       . valsartan-hydrochlorothiazide (DIOVAN-HCT) 320-25 MG per tablet Take 1 tablet by mouth daily.      Marland Kitchen warfarin (COUMADIN) 5 MG tablet Take 2.5-5 mg by mouth daily. Takes 2.5 on Mon and Fri. Takes 5 mg on all other days      . erythromycin (ERYPED 400) 400 MG/5ML suspension Take 2 tablets 1 hour before dental appt.       Marland Kitchen guaiFENesin (MUCINEX) 600 MG 12 hr tablet Take 1,200 mg by mouth 2 (two) times daily.       No current facility-administered medications for this  visit.    Past Medical History  Diagnosis Date  . Allergic rhinitis   . 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 Hyperlipidemia   . Essential hypertension, benign   . Anxiety   . Depression   . Lumbar disc disease   . OSA (obstructive sleep apnea)   . Atrial flutter     Past Surgical History  Procedure Laterality Date  . Cataract extraction    . Lasik      Both eyes  . Total hip arthroplasty  2010    Right  . Anterior fusion lumbar spine    . Colonoscopy N/A 08/04/2012    Procedure: COLONOSCOPY;  Surgeon: Rogene Houston, MD;  [facility]: [facility];  Service: Endoscopy;  Laterality: N/A;  730-rescheduled to 64 Ann notified pt    Family History  Problem Relation Age of Onset  . Cancer Mother   . Heart attack Father   . Colon cancer Other     Social History Ms. Heinle reports that she has never smoked. She has never used smokeless tobacco. Ms. Willmott reports that she does not drink alcohol.  Review of Systems Cough has improved. No fevers or chills. Chronic shortness of breath, recent fatigue as noted. No bleeding problems on Coumadin. Otherwise negative.  Physical Examination Filed Vitals:   04/06/13 0842  BP: 124/80  Pulse: 81   Filed Weights   04/06/13 0842  Weight: 208 lb 12.8 oz (94.711 kg)    Overweight woman in no acute distress.  HEENT: Conjunctiva and lids normal, oropharynx with moist mucosa.  Neck: Supple, no elevated JVP or bruits.  Lungs: Clear to auscultation, diminished, nonlabored.  Cardiac: Irregularly irregular, distant heart sounds, no significant systolic murmur.  Abdomen: Soft, nontender, bowel sounds present.  Extremities: No pitting edema.  Skin: Warm and dry. Muscular skeletal: No kyphosis. Neuropsychiatric: Alert and oriented x3, affect appropriate.   Problem List and Plan   Atrial flutter Most recent episode onset looks to be in December in the setting of bronchitis which has resolved. She  remains in atrial flutter although with variable response and better rate. At home she reports heart rate in the 90-110 range. She seems to be tolerating short acting Cardizem at 60 mg 3 times a day and otherwise continues on flecainide and Coumadin. Complicating her management is also sick sinus syndrome with significant bradycardia when in sinus rhythm. I think that the best option at this time is to have her see Dr. Rayann Heman for EP evaluation regarding potential ablation considerations and also whether she might ultimately benefit from device treatment for tachycardia-bradycardia syndrome.  ATRIAL FIBRILLATION This has also been documented over time as well. She has tolerated flecainide,  previous negative ischemic workup. Last echocardiogram was done at [facility] internal medicine in 2012 and will be repeated to assess left atrial size and ensure that LVEF remains normal. Coumadin is followed by Dr. Manuella Ghazi.  BRADYCARDIA-TACHYCARDIA SYNDROME As noted above, this does present somewhat of a barrier to optimal management of her atrial arrhythmias in terms of rate control. We have discussed the possibility that eventually she may need a pacemaker.  Essential hypertension, benign Blood pressure control is good today.  OBSTRUCTIVE SLEEP APNEA Followed by Dr. Annamaria Boots.    Satira Sark, M.D., F.A.C.C.

## 2013-04-06 NOTE — Assessment & Plan Note (Signed)
Blood pressure control is good today. 

## 2013-04-06 NOTE — Assessment & Plan Note (Signed)
This has also been documented over time as well. She has tolerated flecainide, previous negative ischemic workup. Last echocardiogram was done at [facility] internal medicine in 2012 and will be repeated to assess left atrial size and ensure that LVEF remains normal. Coumadin is followed by Dr. Manuella Ghazi.

## 2013-04-07 ENCOUNTER — Telehealth: Payer: Self-pay | Admitting: Cardiology

## 2013-04-07 NOTE — Telephone Encounter (Signed)
Appointment with Dr. Rayann Heman is set for for Monday, April 24, 2013. Spoke with [name] [name] And she is wanting to know if she should wait this long to see Dr. Rayann Heman. She spoke of having A cardioversion and was wanting to know if this needs to be done before her visit with Dr. Rayann Heman. She states today that she is very tired.

## 2013-04-07 NOTE — Telephone Encounter (Signed)
Would not pursue cardioversion prior to her seeing Dr. Rayann Heman. I am concerned that she has underlying conduction system disease and may have severe bradycardia post-conversion.

## 2013-04-10 NOTE — Telephone Encounter (Signed)
Patient informed. 

## 2013-04-12 ENCOUNTER — Other Ambulatory Visit: Payer: Medicare Other

## 2013-04-12 ENCOUNTER — Other Ambulatory Visit (INDEPENDENT_AMBULATORY_CARE_PROVIDER_SITE_OTHER): Payer: Medicare Other

## 2013-04-12 ENCOUNTER — Telehealth: Payer: Self-pay | Admitting: *Deleted

## 2013-04-12 DIAGNOSIS — I059 Rheumatic mitral valve disease, unspecified: Secondary | ICD-10-CM

## 2013-04-12 DIAGNOSIS — I4891 Unspecified atrial fibrillation: Secondary | ICD-10-CM

## 2013-04-12 DIAGNOSIS — I4892 Unspecified atrial flutter: Secondary | ICD-10-CM

## 2013-04-12 NOTE — Telephone Encounter (Signed)
Message copied by Merlene Laughter on Wed Apr 12, 2013  4:17 PM ------      Message from: MCDOWELL, Aloha Gell      Created: Wed Apr 12, 2013 12:45 PM       Reviewed report. Images are limited, but LVEF described as normal with mild LVH. Mildly dilated left atrium. Continue with same plan for EP evaluation. ------

## 2013-04-12 NOTE — Telephone Encounter (Signed)
Patient informed. 

## 2013-04-24 ENCOUNTER — Encounter: Payer: Self-pay | Admitting: Internal Medicine

## 2013-04-24 ENCOUNTER — Ambulatory Visit (INDEPENDENT_AMBULATORY_CARE_PROVIDER_SITE_OTHER): Payer: Medicare Other | Admitting: Internal Medicine

## 2013-04-24 VITALS — BP 110/68 | HR 103 | Ht 62.0 in | Wt 200.0 lb

## 2013-04-24 DIAGNOSIS — I10 Essential (primary) hypertension: Secondary | ICD-10-CM

## 2013-04-24 DIAGNOSIS — I4892 Unspecified atrial flutter: Secondary | ICD-10-CM

## 2013-04-24 DIAGNOSIS — I495 Sick sinus syndrome: Secondary | ICD-10-CM

## 2013-04-24 DIAGNOSIS — G4733 Obstructive sleep apnea (adult) (pediatric): Secondary | ICD-10-CM

## 2013-04-24 DIAGNOSIS — I4891 Unspecified atrial fibrillation: Secondary | ICD-10-CM

## 2013-04-24 DIAGNOSIS — Z7901 Long term (current) use of anticoagulants: Secondary | ICD-10-CM

## 2013-04-24 NOTE — Progress Notes (Signed)
Primary Care Physician: Monico Blitz, MD Referring Physician:  Dr Sherri Rad is a 73 y.o. female with a h/o atrial fibrillation and atrial flutter who presents today for EP consultation.  She reports initially being diagnosed with atrial fibrillation around 2000.  She reports that she has previously been evaluated by Dr Ola Spurr for ablation at [facility] but declined the procedure at that time.  She has found over time that exacerbations of asthma has caused episodic Afib. She reports symptoms of fatigue and decreased exercise tolerance with afib.  In general, her atrial arrhythmias were controlled with flecainide.  She has had chronic sinus bradycardia for which she has been asymptomatic.  She reports doing well recently until 12/14 when she was admitted to [facility] with bronchitis.  She was observed to have typical appearing atrial flutter.  She had some RVR which has since improved.  Unfortunately, she continues to struggle with reactive airway disease and respiratory failure.  Most recently she was hospitalized 04/17/13 until 04/21/13.  She continues to struggle with extreme wheezing and cough.  She has scheduled follow-up with Dr Koleen Nimrod in [place] (Pulmonary in 2 weeks).  She has OSA and reports compliance with her CPAP.   Today, she denies symptoms of palpitations, chest pain,  lower extremity edema, dizziness, presyncope, syncope, or neurologic sequela. The patient is tolerating medications without difficulties and is otherwise without complaint today.   Past Medical History  Diagnosis Date  . Allergic rhinitis   . 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 Hyperlipidemia   . Essential hypertension, benign   . Anxiety   . Depression   . Lumbar disc disease   . OSA (obstructive sleep apnea)     uses CPAP  . Atrial flutter   . Obesity   . Respiratory failure     2 recent hospitalizations for hypoxic respiratory failure/ reactive airway  disease  . COPD (chronic obstructive pulmonary disease)    Past Surgical History  Procedure Laterality Date  . Cataract extraction    . Lasik      Both eyes  . Total hip arthroplasty  2010    Right  . Anterior fusion lumbar spine    . Colonoscopy N/A 08/04/2012    Procedure: COLONOSCOPY;  Surgeon: Rogene Houston, MD;  Location: [facility];  Service: Endoscopy;  Laterality: N/A;  730-rescheduled to [place] notified pt    Current Outpatient Prescriptions  Medication Sig Dispense Refill  . albuterol (PROVENTIL) (2.5 MG/3ML) 0.083% nebulizer solution Take 2.5 mg by nebulization every 6 (six) hours as needed for wheezing.       Marland Kitchen antiseptic oral rinse (BIOTENE) LIQD 15 mLs by Mouth Rinse route as needed (Dry Mouth).       Marland Kitchen BIOTIN 5000 PO Take 1 tablet by mouth daily.        . budesonide (PULMICORT) 0.5 MG/2ML nebulizer solution Take 0.5 mg by nebulization daily.      . Calcium Carbonate-Vit D-Min 600-400 MG-UNIT TABS Take 2 tablets by mouth daily.       Marland Kitchen diltiazem (CARDIZEM) 30 MG tablet Take 30 mg by mouth as needed.      . diltiazem (CARDIZEM) 60 MG tablet Take 1 tablet (60 mg total) by mouth 3 (three) times daily.  90 tablet  6  . docusate-casanthranol (PERICOLACE) 100-30 MG per capsule Take 1 capsule by mouth daily as needed for constipation.       Marland Kitchen erythromycin (ERYPED 400)  400 MG/5ML suspension Take 2 tablets 1 hour before dental appt.       . flecainide (TAMBOCOR) 100 MG tablet TAKE 1 TABLET BY MOUTH TWICE DAILY  60 tablet  6  . fluticasone (FLONASE) 50 MCG/ACT nasal spray Place 2 sprays into the nose daily as needed for allergies.  16 g  prn  . omeprazole (PRILOSEC) 20 MG capsule Take 1 capsule by mouth 2 (two) times daily.       Marland Kitchen SPIRIVA HANDIHALER 18 MCG inhalation capsule Place 18 mcg into inhaler and inhale as needed (Shortness of Breath).       . valsartan-hydrochlorothiazide (DIOVAN-HCT) 320-25 MG per tablet Take 1 tablet by mouth daily.      Marland Kitchen warfarin (COUMADIN) 5 MG  tablet Take 2.5-5 mg by mouth daily. Takes 2.5 on Mon and Fri. Takes 5 mg on all other days       No current facility-administered medications for this visit.    Allergies  Allergen Reactions  . Bupropion Hcl Other (See Comments)    Suicidal Thoughts.   . Escitalopram Oxalate Other (See Comments)    Suicidal Thoughts.   . Fluticasone-Salmeterol Other (See Comments)    Caused patient to go into Afib.   Marland Kitchen Lisinopril   . Serevent Other (See Comments)    Caused patient to go into Afib  . Amoxicillin Rash  . Macrodantin [Nitrofurantoin] Rash  . Penicillins Rash    History   Social History  . Marital Status: Single    Spouse Name: N/A    Number of Children: N/A  . Years of Education: N/A   Occupational History  . Retired: Presenter, broadcasting    Social History Main Topics  . Smoking status: Never Smoker   . Smokeless tobacco: Never Used  . Alcohol Use: No  . Drug Use: No  . Sexual Activity: Not on file   Other Topics Concern  . Not on file   Social History Narrative   Pt lives in [place] alone. She was never married.   Retired Customer service manager.   Attends [employer]    Family History  Problem Relation Age of Onset  . Cancer Mother   . Heart attack Father   . Colon cancer Other     ROS- All systems are reviewed and negative except as per the HPI above  Physical Exam: Filed Vitals:   04/24/13 0832  BP: 110/68  Pulse: 103  Height: 5\' 2"  (1.575 m)  Weight: 200 lb (90.719 kg)    GEN- The patient is overweight appearing, alert and oriented x 3 today.   Head- normocephalic, atraumatic Eyes-  Sclera clear, conjunctiva pink Ears- hearing intact Oropharynx- clear Neck- supple, no JVP Lungs- diffuse audible expiratory wheezes with prolonged expiratory phase, coarse BS at the bases and frequent coughing Heart- irregular rate and rhythm, no murmurs, rubs or gallops, PMI not laterally displaced GI- soft, NT, ND, + BS Extremities- no clubbing, cyanosis,  or edema MS- no significant deformity or atrophy Skin- no rash or lesion Psych- euthymic mood, full affect Neuro- strength and sensation are intact  EKG today reveals afib, V rate 103 bpm, Qtc 410 msec Echo is reviewed Notes from [facility] are reviewed Dr Chuck Hint notes are reviewed Records from [facility] are also reviewed  Assessment and Plan:  1. Atrial fibrillation and atrial flutter The patient has symptomatic atrial arrhythmias which appear to be persistent presently. Therapeutic strategies for afib and atrial flutter including medicine and ablation were discussed in  detail with the patient today. Risk, benefits, and alternatives to EP study and radiofrequency ablation for afib were also discussed in detail today. Given her recent severe reactive airway issues, she is presently high risk for anesthesia/ ablation.  She agrees that she would prefer to defer procedures until she is clinically improved.  I did discuss tikosyn as an option to flecainide.  She does not wish to return to the hospital at this time. Presently, she would prefer to continue her current strategy and reconsider once her respiratory failure is improved. CHADS2VASC score is at least 3.  She will continue coumadin.  2. OSA Compliance with CPAP is encouraged  3. HTN Stable No change required today  4. Reactive airway disease She will establish with pulmonary soon  5. Tachy/brady She is asymptomatic with bradycardia and is clear that she would prefer to avoid PPM long term. Continue current medicine regimen.  Upon return if she is still in afib then our options would be ablation, tikosyn, or long term rate control  I will see her again in 2 months

## 2013-04-24 NOTE — Patient Instructions (Signed)
Your physician recommends that you schedule a follow-up appointment in: 2 months with Dr Rayann Heman

## 2013-05-19 ENCOUNTER — Telehealth: Payer: Self-pay | Admitting: *Deleted

## 2013-05-19 NOTE — Telephone Encounter (Signed)
Spoke with patient r/e email that was sent and she stated that now she feels fine ,HR is now 36 and she is back in sinus rhythm. Patient denies chest pain, sob or dizziness. Nurse advised patient to continue monitoring her HR and BP and if she began having symptoms of dizziness, chest pain or sob to proceed to ED. Patient verbalized understanding of plan.

## 2013-05-19 NOTE — Telephone Encounter (Signed)
From: Waymon Budge [[email or web address]]  Sent: Friday, May 19, 2013 3:46 PM To: Cher Nakai Subject: [name] [name] Heart Rate   My birthdate is [birth year].  Please call me at [phone] if there is any cause for concern.  My heart rate has been up in the 90's and low 100's since December 15.  All of a sudden it has started going down this past Tuesday.  Normally I think that would be a good thing, but I don't understand why this is happening.  Since the weekend is coming up, I felt that I needed to let Dr. Domenic Polite know.  Most of the time I am not in afib, but I am at the moment.  Thank you.

## 2013-06-06 ENCOUNTER — Ambulatory Visit (INDEPENDENT_AMBULATORY_CARE_PROVIDER_SITE_OTHER): Payer: Medicare Other | Admitting: Cardiology

## 2013-06-06 ENCOUNTER — Encounter: Payer: Self-pay | Admitting: Cardiology

## 2013-06-06 VITALS — BP 132/71 | HR 51 | Ht 62.5 in | Wt 202.0 lb

## 2013-06-06 DIAGNOSIS — I10 Essential (primary) hypertension: Secondary | ICD-10-CM

## 2013-06-06 DIAGNOSIS — G4733 Obstructive sleep apnea (adult) (pediatric): Secondary | ICD-10-CM

## 2013-06-06 DIAGNOSIS — I4892 Unspecified atrial flutter: Secondary | ICD-10-CM

## 2013-06-06 DIAGNOSIS — I495 Sick sinus syndrome: Secondary | ICD-10-CM

## 2013-06-06 NOTE — Assessment & Plan Note (Signed)
Does not look to be a significantly symptom provoking issue at this time. Continue observation.

## 2013-06-06 NOTE — Assessment & Plan Note (Signed)
Also atrial fibrillation by history. She has had a significant improvement in symptoms since February as outlined above. Heart rate is normal to mildly bradycardic and she is getting back to her exercise activities. Plan will be continue anticoagulation, Cardizem, and flecainide. She has seen Dr. Rayann Heman, and certainly if she develops progressive problems with management of her atrial arrhythmias, further discussion can be had regarding a change in antiarrhythmic regimen and/or potentially ablation. I plan to see her back in the next 3 months.

## 2013-06-06 NOTE — Progress Notes (Signed)
Clinical Summary [name] [name] is a 73 y.o.female last seen in January of this year. I referred her subsequently to see Dr. Rayann Heman for EP consultation regarding potential ablation options for her atrial arrhythmia and also tachycardia-bradycardia syndrome - I reviewed his recommendations and initial plan for observation with no other changes pending improvement in her pulmonary status.  Around February 18 she reported a significant improvement in symptoms and has had normal to mildly bradycardic heart rates since then. She thinks that she may have spontaneously converted at that point. She is getting back to exercising in the pool, otherwise continues on Cardizem at 60 mg 3 times a day, also flecainide. Pulmonary status is improving as well.   Allergies  Allergen Reactions  . Bupropion Hcl Other (See Comments)    Suicidal Thoughts.   . Escitalopram Oxalate Other (See Comments)    Suicidal Thoughts.   . Fluticasone-Salmeterol Other (See Comments)    Caused patient to go into Afib.   Marland Kitchen Lisinopril   . Serevent Other (See Comments)    Caused patient to go into Afib  . Amoxicillin Rash  . Macrodantin [Nitrofurantoin] Rash  . Penicillins Rash    Current Outpatient Prescriptions  Medication Sig Dispense Refill  . antiseptic oral rinse (BIOTENE) LIQD 15 mLs by Mouth Rinse route as needed (Dry Mouth).       Marland Kitchen azelastine (ASTELIN) 137 MCG/SPRAY nasal spray Place 2 sprays into both nostrils 2 (two) times daily. Use in each nostril as directed      . BIOTIN 5000 PO Take 1 tablet by mouth daily.        . budesonide (PULMICORT) 0.5 MG/2ML nebulizer solution Take 0.5 mg by nebulization daily.      . Calcium Carbonate-Vit D-Min 600-400 MG-UNIT TABS Take 2 tablets by mouth daily.       Marland Kitchen diltiazem (CARDIZEM) 30 MG tablet Take 30 mg by mouth as needed.      . diltiazem (CARDIZEM) 60 MG tablet Take 1 tablet (60 mg total) by mouth 3 (three) times daily.  90 tablet  6  . docusate-casanthranol  (PERICOLACE) 100-30 MG per capsule Take 1 capsule by mouth daily as needed for constipation.       Marland Kitchen erythromycin (ERYPED 400) 400 MG/5ML suspension Take 2 tablets 1 hour before dental appt.       . flecainide (TAMBOCOR) 100 MG tablet TAKE 1 TABLET BY MOUTH TWICE DAILY  60 tablet  6  . fluticasone (FLONASE) 50 MCG/ACT nasal spray Place 2 sprays into the nose daily as needed for allergies.  16 g  prn  . montelukast (SINGULAIR) 10 MG tablet Take 10 mg by mouth at bedtime.      Marland Kitchen omeprazole (PRILOSEC) 20 MG capsule Take 1 capsule by mouth 2 (two) times daily.       . valsartan-hydrochlorothiazide (DIOVAN-HCT) 320-25 MG per tablet Take 1 tablet by mouth daily.      Marland Kitchen warfarin (COUMADIN) 5 MG tablet Take 2.5-5 mg by mouth daily. Takes 2.5 on Mon and Fri. Takes 5 mg on all other days       No current facility-administered medications for this visit.    Past Medical History  Diagnosis Date  . Allergic rhinitis   . 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 Hyperlipidemia   . Essential hypertension, benign   . Anxiety   . Depression   . Lumbar disc disease   . OSA (obstructive sleep apnea)  uses CPAP  . Atrial flutter   . Obesity   . Respiratory failure     2 recent hospitalizations for hypoxic respiratory failure/ reactive airway disease  . COPD (chronic obstructive pulmonary disease)     Social History Ms. Daponte reports that she has never smoked. She has never used smokeless tobacco. Ms. Sethi reports that she does not drink alcohol.  Review of Systems Peripheral neuropathy, undergoing neurological evaluation. Reflux symptoms. Otherwise negative except as outlined.  Physical Examination Filed Vitals:   06/06/13 1038  BP: 132/71  Pulse: 51   Filed Weights   06/06/13 1038  Weight: 202 lb (91.627 kg)    Overweight woman in no acute distress.  HEENT: Conjunctiva and lids normal, oropharynx with moist mucosa.  Neck: Supple, no elevated JVP  or bruits.  Lungs: Clear to auscultation, diminished, nonlabored.  Cardiac: Regular rate and rhythm, distant heart sounds, no significant systolic murmur.  Abdomen: Soft, nontender, bowel sounds present.  Extremities: No pitting edema.  Skin: Warm and dry.  Muscular skeletal: No kyphosis.  Neuropsychiatric: Alert and oriented x3, affect appropriate.   Problem List and Plan   Atrial flutter Also atrial fibrillation by history. She has had a significant improvement in symptoms since February as outlined above. Heart rate is normal to mildly bradycardic and she is getting back to her exercise activities. Plan will be continue anticoagulation, Cardizem, and flecainide. She has seen Dr. Rayann Heman, and certainly if she develops progressive problems with management of her atrial arrhythmias, further discussion can be had regarding a change in antiarrhythmic regimen and/or potentially ablation. I plan to see her back in the next 3 months.  Essential hypertension, benign No change in antihypertensive regimen.  OBSTRUCTIVE SLEEP APNEA Followed by Dr. Annamaria Boots.  BRADYCARDIA-TACHYCARDIA SYNDROME Does not look to be a significantly symptom provoking issue at this time. Continue observation.    Satira Sark, M.D., F.A.C.C.

## 2013-06-06 NOTE — Patient Instructions (Signed)
Your physician recommends that you schedule a follow-up appointment in: 3 months. Your physician recommends that you continue on your current medications as directed. Please refer to the Current Medication list given to you today. 

## 2013-06-06 NOTE — Assessment & Plan Note (Signed)
No change in antihypertensive regimen. 

## 2013-06-06 NOTE — Assessment & Plan Note (Signed)
Followed by Dr. Young. 

## 2013-06-12 ENCOUNTER — Other Ambulatory Visit: Payer: Self-pay | Admitting: Cardiology

## 2013-06-21 ENCOUNTER — Encounter: Payer: Self-pay | Admitting: Internal Medicine

## 2013-06-21 ENCOUNTER — Telehealth (HOSPITAL_COMMUNITY): Payer: Self-pay | Admitting: Dietician

## 2013-06-21 ENCOUNTER — Ambulatory Visit (INDEPENDENT_AMBULATORY_CARE_PROVIDER_SITE_OTHER): Payer: Medicare Other | Admitting: Internal Medicine

## 2013-06-21 VITALS — BP 130/78 | HR 53 | Ht 62.5 in | Wt 205.4 lb

## 2013-06-21 DIAGNOSIS — E6609 Other obesity due to excess calories: Secondary | ICD-10-CM | POA: Insufficient documentation

## 2013-06-21 DIAGNOSIS — I10 Essential (primary) hypertension: Secondary | ICD-10-CM

## 2013-06-21 DIAGNOSIS — I4892 Unspecified atrial flutter: Secondary | ICD-10-CM

## 2013-06-21 DIAGNOSIS — I4891 Unspecified atrial fibrillation: Secondary | ICD-10-CM

## 2013-06-21 DIAGNOSIS — G4733 Obstructive sleep apnea (adult) (pediatric): Secondary | ICD-10-CM

## 2013-06-21 NOTE — Patient Instructions (Addendum)
Your physician recommends that you schedule a follow-up appointment   As needed with Dr. Rayann Heman.    You have been referred to  Dietician at [facility]

## 2013-06-21 NOTE — Progress Notes (Signed)
PCP:  Monico Blitz, MD Primary Cardiologist:  Dr Domenic Polite  The patient presents today for routine electrophysiology followup.  Since last being seen in our clinic, the patient reports doing very well.  She seems to be maintaining sinus rhythm.  Her SOB is improved.  Today, she denies symptoms of palpitations, chest pain, shortness of breath, orthopnea, PND, lower extremity edema, dizziness, presyncope, syncope, or neurologic sequela.  The patient feels that she is tolerating medications without difficulties and is otherwise without complaint today.   Past Medical History  Diagnosis Date  . Allergic rhinitis   . 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 Hyperlipidemia   . Essential hypertension, benign   . Anxiety   . Depression   . Lumbar disc disease   . OSA (obstructive sleep apnea)     uses CPAP  . Atrial flutter   . Obesity   . Respiratory failure     2 recent hospitalizations for hypoxic respiratory failure/ reactive airway disease  . COPD (chronic obstructive pulmonary disease)    Past Surgical History  Procedure Laterality Date  . Cataract extraction    . Lasik      Both eyes  . Total hip arthroplasty  2010    Right  . Anterior fusion lumbar spine    . Colonoscopy N/A 08/04/2012    Procedure: COLONOSCOPY;  Surgeon: Rogene Houston, MD;  Location: [facility];  Service: Endoscopy;  Laterality: N/A;  730-rescheduled to [place] notified pt    Current Outpatient Prescriptions  Medication Sig Dispense Refill  . antiseptic oral rinse (BIOTENE) LIQD 15 mLs by Mouth Rinse route as needed (Dry Mouth).       Marland Kitchen azelastine (ASTELIN) 137 MCG/SPRAY nasal spray Place 2 sprays into both nostrils 2 (two) times daily. Use in each nostril as directed      . BIOTIN 5000 PO Take 1 tablet by mouth daily.        . budesonide (PULMICORT) 0.5 MG/2ML nebulizer solution Take 0.5 mg by nebulization daily.      . Calcium Carbonate-Vit D-Min 600-400 MG-UNIT TABS  Take 2 tablets by mouth daily.       Marland Kitchen diltiazem (CARDIZEM) 30 MG tablet Take 30 mg by mouth as needed.      . diltiazem (CARDIZEM) 60 MG tablet Take 1 tablet (60 mg total) by mouth 3 (three) times daily.  90 tablet  6  . docusate-casanthranol (PERICOLACE) 100-30 MG per capsule Take 1 capsule by mouth daily as needed for constipation.       Marland Kitchen erythromycin (ERYPED 400) 400 MG/5ML suspension Take 2 tablets 1 hour before dental appt.       . flecainide (TAMBOCOR) 100 MG tablet TAKE 1 TABLET BY MOUTH TWICE DAILY      . fluticasone (FLONASE) 50 MCG/ACT nasal spray Place 2 sprays into the nose daily as needed for allergies.  16 g  prn  . montelukast (SINGULAIR) 10 MG tablet Take 10 mg by mouth at bedtime.      Marland Kitchen omeprazole (PRILOSEC) 20 MG capsule Take 1 capsule by mouth 2 (two) times daily.       . valsartan-hydrochlorothiazide (DIOVAN-HCT) 320-25 MG per tablet Take 1 tablet by mouth daily.      Marland Kitchen warfarin (COUMADIN) 5 MG tablet Take 5 mg by mouth daily.        No current facility-administered medications for this visit.    Allergies  Allergen Reactions  . Bupropion Hcl  Other (See Comments)    Suicidal Thoughts.   . Escitalopram Oxalate Other (See Comments)    Suicidal Thoughts.   . Fluticasone-Salmeterol Other (See Comments)    Caused patient to go into Afib.   Marland Kitchen Lisinopril   . Serevent Other (See Comments)    Caused patient to go into Afib  . Amoxicillin Rash  . Macrodantin [Nitrofurantoin] Rash  . Penicillins Rash    History   Social History  . Marital Status: Single    Spouse Name: N/A    Number of Children: N/A  . Years of Education: N/A   Occupational History  . Retired: Presenter, broadcasting    Social History Main Topics  . Smoking status: Never Smoker   . Smokeless tobacco: Never Used  . Alcohol Use: No  . Drug Use: No  . Sexual Activity: Not on file   Other Topics Concern  . Not on file   Social History Narrative   Pt lives in [place] alone. She was never married.     Retired Customer service manager.   Attends [employer]    Family History  Problem Relation Age of Onset  . Cancer Mother   . Heart attack Father   . Colon cancer Other     ROS-  All systems are reviewed and are negative except as outlined in the HPI above  Physical Exam: Filed Vitals:   06/21/13 0821  BP: 130/78  Pulse: 53  Height: 5' 2.5" (1.588 m)  Weight: 205 lb 6.4 oz (93.169 kg)    GEN- The patient is overweight appearing, alert and oriented x 3 today.   Head- normocephalic, atraumatic Eyes-  Sclera clear, conjunctiva pink Ears- hearing intact Oropharynx- clear Neck- supple,  Lungs- Clear to ausculation bilaterally, normal work of breathing Heart- Regular rate and rhythm  GI- soft, NT, ND, + BS Extremities- no clubbing, cyanosis, or edema Neuro- strength and sensation are intact  ekg today reveals sinus bradycardia 53 bpm, PR 180, septal infarct  Assessment and Plan:  1.  afib Presently maintaining sinus with flecainide.  Could consider tikosyn if her afib worsens.  Given her lung disease, I would be reluctant to consider ablation. Continue coumadin CHADS2VASC score is at least 3.  No changes today Weight loss is advised  2. Obesity Weight loss encouraged Will refer to a dietician  3. htn Stable No change required today  4. osa Compliance with cpap encouraged  Follow-up with Dr Domenic Polite as scheduled I will see as needed going forward

## 2013-06-21 NOTE — Telephone Encounter (Signed)
Received message from Dr. Jackalyn Lombard office at [phone]. Referral located in EPIC. Dx: obesity.

## 2013-06-23 NOTE — Telephone Encounter (Signed)
Spoke with Neoma Laming at Dr. Jackalyn Lombard office at [phone]. Appointment scheduled for 07/25/13 at 1000.

## 2013-07-25 ENCOUNTER — Encounter (HOSPITAL_COMMUNITY): Payer: Self-pay | Admitting: Dietician

## 2013-07-25 NOTE — Progress Notes (Signed)
Outpatient Initial Nutrition Assessment  Date:07/25/2013   Appt Start Time: 0954  Referring Physician: Dr. Rayann Heman [facility]) Reason for Visit: obesity  Nutrition Assessment:  Height: 5' 2.5" (158.8 cm)   Weight: 204 lb (92.534 kg)   IBW: 107#  %IBW: 191% UBW: 170#  %UBW: 120% Body mass index is 36.69 kg/(m^2). Meets criteria for obesity, class II. Goal Weight: 184# (10% loss of current weight) Weight hx: Wt Readings from Last 10 Encounters:  06/21/13 205 lb 6.4 oz (93.169 kg)  06/06/13 202 lb (91.627 kg)  04/24/13 200 lb (90.719 kg)  04/06/13 208 lb 12.8 oz (94.711 kg)  03/24/13 203 lb (92.08 kg)  03/14/13 206 lb (93.441 kg)  02/02/13 209 lb (94.802 kg)  12/28/12 202 lb (91.627 kg)  10/04/12 196 lb 4.8 oz (89.041 kg)  08/04/12 200 lb (90.719 kg)    Estimated nutritional needs:  Kcals/ day: 1800-2000 Protein (grams)/day: 74-93 Fluid (L)/ day: 1.8-2.0  PMH:  Past Medical History  Diagnosis Date  . Allergic rhinitis   . 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 Hyperlipidemia   . Essential hypertension, benign   . Anxiety   . Depression   . Lumbar disc disease   . OSA (obstructive sleep apnea)     uses CPAP  . Atrial flutter   . Obesity   . Respiratory failure     2 recent hospitalizations for hypoxic respiratory failure/ reactive airway disease  . COPD (chronic obstructive pulmonary disease)     Medications:  Current Outpatient Rx  Name  Route  Sig  Dispense  Refill  . antiseptic oral rinse (BIOTENE) LIQD   Mouth Rinse   15 mLs by Mouth Rinse route as needed (Dry Mouth).          Marland Kitchen azelastine (ASTELIN) 137 MCG/SPRAY nasal spray   Each Nare   Place 2 sprays into both nostrils 2 (two) times daily. Use in each nostril as directed         . BIOTIN 5000 PO   Oral   Take 1 tablet by mouth daily.           . budesonide (PULMICORT) 0.5 MG/2ML nebulizer solution   Nebulization   Take 0.5 mg by nebulization daily.        . Calcium Carbonate-Vit D-Min 600-400 MG-UNIT TABS   Oral   Take 2 tablets by mouth daily.          Marland Kitchen diltiazem (CARDIZEM) 30 MG tablet   Oral   Take 30 mg by mouth as needed.         . diltiazem (CARDIZEM) 60 MG tablet   Oral   Take 1 tablet (60 mg total) by mouth 3 (three) times daily.   90 tablet   6     Stop long acting Diltiazem, med changed 03/24/2013 ...   . docusate-casanthranol (PERICOLACE) 100-30 MG per capsule   Oral   Take 1 capsule by mouth daily as needed for constipation.          Marland Kitchen erythromycin (ERYPED 400) 400 MG/5ML suspension      Take 2 tablets 1 hour before dental appt.          . flecainide (TAMBOCOR) 100 MG tablet      TAKE 1 TABLET BY MOUTH TWICE DAILY         . fluticasone (FLONASE) 50 MCG/ACT nasal spray   Nasal   Place 2 sprays into the nose daily as  needed for allergies.   16 g   prn   . montelukast (SINGULAIR) 10 MG tablet   Oral   Take 10 mg by mouth at bedtime.         Marland Kitchen omeprazole (PRILOSEC) 20 MG capsule   Oral   Take 1 capsule by mouth 2 (two) times daily.          . valsartan-hydrochlorothiazide (DIOVAN-HCT) 320-25 MG per tablet   Oral   Take 1 tablet by mouth daily.         Marland Kitchen warfarin (COUMADIN) 5 MG tablet   Oral   Take 5 mg by mouth daily.            Labs: CMP  No results found for this basename: na, k, cl, co2, glucose, bun, creatinine, calcium, prot, albumin, ast, alt, alkphos, bilitot, gfrnonaa, gfraa    Lipid Panel  No results found for this basename: chol, trig, hdl, cholhdl, vldl, ldlcalc     No results found for this basename: HGBA1C   No results found for this basename: GLUF, MICROALBUR, LDLCALC, CREATININE     Lifestyle/ social habits: [name] [name] resides alone in [place]. Occupation: retired.  Physical activity: she reports she recently started going to the pool at the [facility] 3 times per week. She completes approximately one hour of exercises which consist of water walking,  swimming, and shallow water exercises. She is also contemplating walking, but reports this is difficult for her, as she gets SOB if walking for more than 3-5 minutes continuously.   Nutrition hx/habits: Ms. Cubillos reports to poor diet habits, stating she eats out frequently. She has tried to modify her diet by cutting back on sugary beverages and cooking her breakfast at home instead of going out. She often eats lunch out, but will eat only a half portion and save the rest for dinner. She admits that she used to drink a gallon of sweet tea daily, but now drinks mostly water. She describes herself as a "[employer]aholic" because she would get a ham biscuit at [employer] daily.  She reports she does not cook very much because she never learned. She has tried to cook recently, but struggles due to living alone.  She eats out at a variety of different places including [place], [place], [place], [place], and [place]. She would like to eat more vegetables, but is afraid this will affect her coumadin levels. She has recently stopped eating hushpuppies, due to the sodium content. She also started using MyFitnessPal to track her calorie intake and questions if this is helpful.   Diet recall: Breakfast: sausage patty or [place] bacon, 1 egg, piece of toast, water; Lunch: [employer], french fries, onion rings, tea; Dinner: leftover [place] food (1/2 portion); Beverages mainly consist of water  Nutrition Diagnosis: Involuntary weight gain r/t excessive energy intake, physical inactivity AEB 20% wt gain x 5 years.   Nutrition Intervention: Nutrition rx: 1300-1500 Kcal NAS, no added sugar diet; 3 meals per day; no snacks; low calorie beverages only; Physical activity 2.5 hours per week  Education/Counseling Provided: Educated pt on principles of weight management. Discussed principles of energy expenditure and how changes in diet and physical activity affect weight status. Discussed nutritional  content of commonly eaten foods and suggested healthier alternatives. Educated pt on plate method and a general, healthful diet that includes low fat dairy, lean meats, whole fruits and vegetables, and whole grains most often. Discussed importance of a healthy diet along with regular  physical activity (at least 30 minutes 5 times per week) to achieve weight loss goals. Encouraged slow, moderate weight loss (0.5-2# weight loss per week) and adopting healthy lifestyle changes vs. obtaining a certain body type or weight. Encouraged weighing self weekly at a consistent day and time of choice. Showed pt functionality of MyFitnessPal and encouraged using a food diary to better track caloric intake. Provided "Weight Loss Tips" and "1500 Calorie Diet" handouts from [facility] Nutrition Care Manual. Used TeachBack to assess understanding.   Understanding, Motivation, Ability to Follow Recommendations: Expect fair compliance.   Monitoring and Evaluation: Goals: 1) 0.5-2# wt loss per week; 2) Physical activity 2.5 hours per week  Recommendations: 1) For weight loss 1300-1500 kcals daily; 2) Break up exercise into smaller, more frequent sessions; 3) Work towards exercise goals; 4) Choose low calorie side items with entrees when eating out  F/U: PRN. Provided RD contact information  Sonny Poth A. Jimmye Norman, RD, LDN 07/25/2013  Appt EndTime: 4801

## 2013-08-28 DIAGNOSIS — D649 Anemia, unspecified: Secondary | ICD-10-CM | POA: Insufficient documentation

## 2013-08-28 DIAGNOSIS — J45909 Unspecified asthma, uncomplicated: Secondary | ICD-10-CM | POA: Insufficient documentation

## 2013-08-28 DIAGNOSIS — I10 Essential (primary) hypertension: Secondary | ICD-10-CM | POA: Insufficient documentation

## 2013-08-30 ENCOUNTER — Other Ambulatory Visit (HOSPITAL_COMMUNITY): Payer: Self-pay | Admitting: Internal Medicine

## 2013-08-30 DIAGNOSIS — Z1231 Encounter for screening mammogram for malignant neoplasm of breast: Secondary | ICD-10-CM

## 2013-08-31 ENCOUNTER — Ambulatory Visit (HOSPITAL_COMMUNITY)
Admission: RE | Admit: 2013-08-31 | Discharge: 2013-08-31 | Disposition: A | Discharge status: Home / Self Care | Payer: Medicare Other | Source: Ambulatory Visit | Attending: Internal Medicine | Admitting: Internal Medicine

## 2013-08-31 DIAGNOSIS — Z1231 Encounter for screening mammogram for malignant neoplasm of breast: Secondary | ICD-10-CM | POA: Insufficient documentation

## 2013-09-13 ENCOUNTER — Ambulatory Visit: Payer: Medicare Other | Admitting: Cardiology

## 2013-09-14 ENCOUNTER — Encounter: Payer: Self-pay | Admitting: Cardiology

## 2013-09-14 ENCOUNTER — Ambulatory Visit (INDEPENDENT_AMBULATORY_CARE_PROVIDER_SITE_OTHER): Payer: Medicare Other | Admitting: Cardiology

## 2013-09-14 VITALS — BP 156/74 | HR 44 | Ht 62.0 in | Wt 206.8 lb

## 2013-09-14 DIAGNOSIS — I4891 Unspecified atrial fibrillation: Secondary | ICD-10-CM

## 2013-09-14 DIAGNOSIS — I495 Sick sinus syndrome: Secondary | ICD-10-CM

## 2013-09-14 DIAGNOSIS — I480 Paroxysmal atrial fibrillation: Secondary | ICD-10-CM

## 2013-09-14 DIAGNOSIS — I10 Essential (primary) hypertension: Secondary | ICD-10-CM

## 2013-09-14 DIAGNOSIS — G4733 Obstructive sleep apnea (adult) (pediatric): Secondary | ICD-10-CM

## 2013-09-14 NOTE — Assessment & Plan Note (Signed)
Continue current regimen including flecainide, Coumadin, and Cardizem. Followup arranged in 4 months.

## 2013-09-14 NOTE — Assessment & Plan Note (Signed)
Continue observation for now, no medication changes made today.

## 2013-09-14 NOTE — Assessment & Plan Note (Signed)
Continues on CPAP. I asked her to followup with Pulmonary to make sure she does not need a sleep titration study.

## 2013-09-14 NOTE — Assessment & Plan Note (Signed)
Blood pressure elevated today. Keep follow up with Dr. Manuella Ghazi.

## 2013-09-14 NOTE — Patient Instructions (Signed)
Your physician recommends that you schedule a follow-up appointment in: 4 months. You will receive a reminder letter in the mail in about 2 months reminding you to call and schedule your appointment. If you don't receive this letter, please contact our office. Your physician recommends that you continue on your current medications as directed. Please refer to the Current Medication list given to you today. 

## 2013-09-14 NOTE — Progress Notes (Signed)
Clinical Summary [name] [name] is a 73 y.o.female was seen in March of this year. Interval followup with Dr. Rayann Heman noted. She continues to do reasonably well from a cardiac perspective. I reviewed her home blood pressure and heart rate checks. She remains bradycardic, but has had no breakthrough rapid atrial fibrillation. Seems to have potential problems with her CPAP at nighttime, I asked her to check with Pulmonary to see about a sleep titration study to make sure that her OSA is being effectively managed.  ECG from March showed sinus bradycardia with left axis.   Allergies  Allergen Reactions  . Bupropion Hcl Other (See Comments)    Suicidal Thoughts.   . Escitalopram Oxalate Other (See Comments)    Suicidal Thoughts.   . Fluticasone-Salmeterol Other (See Comments)    Caused patient to go into Afib.   Marland Kitchen Lisinopril   . Serevent Other (See Comments)    Caused patient to go into Afib  . Amoxicillin Rash  . Macrodantin [Nitrofurantoin] Rash  . Penicillins Rash    Current Outpatient Prescriptions  Medication Sig Dispense Refill  . antiseptic oral rinse (BIOTENE) LIQD 15 mLs by Mouth Rinse route as needed (Dry Mouth).       Marland Kitchen azelastine (ASTELIN) 137 MCG/SPRAY nasal spray Place 2 sprays into both nostrils 2 (two) times daily. Use in each nostril as directed      . BIOTIN 5000 PO Take 1 tablet by mouth daily.        . budesonide (PULMICORT) 0.5 MG/2ML nebulizer solution Take 0.5 mg by nebulization daily.      . Calcium Carbonate-Vit D-Min 600-400 MG-UNIT TABS Take 2 tablets by mouth daily.       Marland Kitchen diltiazem (CARDIZEM) 30 MG tablet Take 30 mg by mouth as needed.      . diltiazem (CARDIZEM) 60 MG tablet Take 1 tablet (60 mg total) by mouth 3 (three) times daily.  90 tablet  6  . docusate-casanthranol (PERICOLACE) 100-30 MG per capsule Take 1 capsule by mouth daily as needed for constipation.       Marland Kitchen erythromycin (ERYPED 400) 400 MG/5ML suspension Take 2 tablets 1 hour before dental  appt.       . flecainide (TAMBOCOR) 100 MG tablet TAKE 1 TABLET BY MOUTH TWICE DAILY      . fluticasone (FLONASE) 50 MCG/ACT nasal spray Place 2 sprays into the nose daily as needed for allergies.  16 g  prn  . montelukast (SINGULAIR) 10 MG tablet Take 10 mg by mouth at bedtime.      Marland Kitchen omeprazole (PRILOSEC) 20 MG capsule Take 1 capsule by mouth 2 (two) times daily.       . valsartan-hydrochlorothiazide (DIOVAN-HCT) 320-25 MG per tablet Take 1 tablet by mouth daily.      Marland Kitchen warfarin (COUMADIN) 5 MG tablet Take 5 mg by mouth daily. MANAGED BY DR. [facility]       No current facility-administered medications for this visit.    Past Medical History  Diagnosis Date  . Allergic rhinitis   . 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 Hyperlipidemia   . Essential hypertension, benign   . Anxiety   . Depression   . Lumbar disc disease   . OSA (obstructive sleep apnea)     uses CPAP  . Atrial flutter   . Obesity   . Respiratory failure     2 recent hospitalizations for hypoxic respiratory failure/ reactive airway disease  .  COPD (chronic obstructive pulmonary disease)     Social History [name] [name] reports that she has never smoked. She has never used smokeless tobacco. [name] [name] reports that she does not drink alcohol.  Review of Systems Having left shoulder discomfort, states that she has been pushing herself up with this shoulder since having a right shoulder surgery. Plans to see a chiropractor later today. Other systems reviewed and negative  Physical Examination Filed Vitals:   09/14/13 1038  BP: 156/74  Pulse: 44   Filed Weights   09/14/13 1038  Weight: 206 lb 12.8 oz (93.804 kg)    Overweight woman in no acute distress.  HEENT: Conjunctiva and lids normal, oropharynx with moist mucosa.  Neck: Supple, no elevated JVP or bruits.  Lungs: Clear to auscultation, diminished, nonlabored.  Cardiac: Regular rate and rhythm, distant heart sounds, no  significant systolic murmur.  Abdomen: Soft, nontender, bowel sounds present.  Extremities: No pitting edema.  Skin: Warm and dry.  Muscular skeletal: No kyphosis.  Neuropsychiatric: Alert and oriented x3, affect appropriate.   Problem List and Plan   Paroxysmal atrial fibrillation Continue current regimen including flecainide, Coumadin, and Cardizem. Followup arranged in 4 months.  BRADYCARDIA-TACHYCARDIA SYNDROME Continue observation for now, no medication changes made today.  OBSTRUCTIVE SLEEP APNEA Continues on CPAP. I asked her to followup with Pulmonary to make sure she does not need a sleep titration study.  Essential hypertension, benign Blood pressure elevated today. Keep follow up with Dr. Manuella Ghazi.    Satira Sark, M.D., F.A.C.C.

## 2013-10-04 ENCOUNTER — Ambulatory Visit: Payer: Medicare Other | Admitting: Internal Medicine

## 2013-10-09 ENCOUNTER — Telehealth: Payer: Self-pay | Admitting: *Deleted

## 2013-10-09 NOTE — Telephone Encounter (Signed)
Spoke with patient and she said that it feels like her heart is beating differently and c/o increased sob. Patient said she just feels tired all the time. No c/o chest pain or no dizziness. Patient called requesting to come to the office so that she could have her heart listened to. Patient took BP 132/62 and HR 52 while on the phone with nurse. Patient informed nurse that her heart was showing a normal rhythm. Nurse advised patient to come in the morning for nurse visit for EKG and vitals.

## 2013-10-10 ENCOUNTER — Ambulatory Visit (INDEPENDENT_AMBULATORY_CARE_PROVIDER_SITE_OTHER): Payer: Medicare Other | Admitting: *Deleted

## 2013-10-10 VITALS — BP 124/75 | HR 48 | Ht 62.0 in | Wt 204.0 lb

## 2013-10-10 DIAGNOSIS — I4891 Unspecified atrial fibrillation: Secondary | ICD-10-CM

## 2013-10-10 DIAGNOSIS — I480 Paroxysmal atrial fibrillation: Secondary | ICD-10-CM

## 2013-10-10 NOTE — Progress Notes (Signed)
I reviewed the ECG. Rhythm is sinus or possibly an ectopic atrial rhythm, but not atrial fibrillation. Would continue current regimen and maintain plans to have CPAP evaluated.

## 2013-10-10 NOTE — Progress Notes (Signed)
Patient came to office today for nurse visit to have EKG, vitals and medication review due to c/o her heart beating funny. Patient said when she wakes up in the mornings she can tell that her heart is out of rhythm and she feels more sob. Patient said she thinks its related to her sleep apnea because of her CPAP needing titration. Patient said she has an up coming visit with her sleep apnea doctor. Patient say's that she walks everyday around 7:30 am. Patient has taken all doses of her medications without any side effects noted. Patient did inform nurse that she recently started taking allergy injections twice a week and she thinks that she is having a reaction to those. Patient said she had already made contact with her allergy doctor about this issue. Patient denies chest pain, dizziness or sob. Patient informed that all information would be sent to MD and if any recommendations were made, she would be contacted.

## 2013-10-15 ENCOUNTER — Other Ambulatory Visit: Payer: Self-pay | Admitting: Cardiology

## 2013-10-16 NOTE — Telephone Encounter (Signed)
Needs to have flecainide (TAMBOCOR) 100 MG tablet Called into [facility] -patient states that [facility] has faxed to [place].

## 2013-10-26 ENCOUNTER — Other Ambulatory Visit: Payer: Self-pay | Admitting: Cardiovascular Disease

## 2013-11-09 ENCOUNTER — Ambulatory Visit: Payer: Medicare Other | Admitting: Internal Medicine

## 2013-11-09 ENCOUNTER — Ambulatory Visit: Payer: Medicare Other

## 2013-11-09 ENCOUNTER — Encounter (INDEPENDENT_AMBULATORY_CARE_PROVIDER_SITE_OTHER): Payer: Self-pay

## 2013-11-09 ENCOUNTER — Encounter: Payer: Self-pay | Admitting: Internal Medicine

## 2013-11-09 VITALS — BP 116/72 | HR 48 | Ht 64.0 in | Wt 207.4 lb

## 2013-11-09 DIAGNOSIS — J45909 Unspecified asthma, uncomplicated: Secondary | ICD-10-CM

## 2013-11-09 DIAGNOSIS — J4540 Moderate persistent asthma, uncomplicated: Secondary | ICD-10-CM

## 2013-11-09 DIAGNOSIS — G4733 Obstructive sleep apnea (adult) (pediatric): Secondary | ICD-10-CM

## 2013-11-09 NOTE — Progress Notes (Signed)
05/06/11- 70 yoF never smoker followed for OSA, allergic rhinitis, complicated by AFib/coumadin LOV 05/05/10 She has had more persistent problems with nasal stuffiness and congestion and postnasal drip with global headache. This interferes with her ability to wear CPAP comfortably. She is using a fullface mask from [place] but she doesn't know her pressure. Ongoing problem with dryness of the eyes and nose. Since last here has had hip and back surgeries, treatment for atrial fibrillation with Coumadin.  10/04/12- 38 yoF never smoker followed for OSA, allergic rhinitis, complicated by AFib/coumadin FOLLOWS FOR: request new rx for flonase.  also c/o sinus congestion, PND, increased SOB, wheezing, throat congestion, prod cough in the AM with thick green mucus w/ the humidity CPAP 10/ [employer] Ran out of Flonase-increased nasal congestion. Albuterol causes tremor. AFib is controlled. Since last here had both hips replaced and lumbar spine surgery. No respiratory complications during surgeries.  11/09/13-  73 yoF never smoker followed for OSA, allergic rhinitis, complicated by AFib/coumadin FOLLOWS FOR:Dec 2014-had cough so bad and heart beating so fast(has Afib) 02-2013 through 04-2013; spent 5 days in [place] to help stop cough to correct heart. Another hospital stay in Jan2015 for cough again-Dr Koleen Nimrod [facility], [place]) saw her and did 3 IgE tests that show levels above 1800 and determined that allergies were the cause. Had allergy test at [facility];Dr Manuella Ghazi office to give injections(had reactions and stopped). [facility] told her to take antihistamines BID while on injections.     ROS-see HPI Constitutional:   No-   weight loss, night sweats, fevers, chills, fatigue, lassitude. HEENT:   No-  headaches, difficulty swallowing, tooth/dental problems, sore throat,       No-  sneezing, itching,  +ear ache,  +nasal congestion, post nasal drip,  CV:  No-   chest pain, orthopnea, PND, swelling  in lower extremities, anasarca, dizziness, palpitations Resp: +  shortness of breath with exertion or at rest.              +   productive cough,  No non-productive cough,  No- coughing up of blood.              +  change in color of mucus.  + wheezing.   Skin: No-   rash or lesions. GI:  No-   heartburn, indigestion, abdominal pain, nausea, vomiting, GU:  MS:  No-   joint pain or swelling. . Neuro-     nothing unusual Psych:  No- change in mood or affect. No depression or anxiety.  No memory loss.   OBJ- Physical Exam General- Alert, Oriented, Affect-appropriate, Distress- none acute Skin- rash-none, lesions- none, excoriation- none Lymphadenopathy- none Head- atraumatic            Eyes- Gross vision intact, PERRLA, conjunctivae and secretions clear            Ears- Hearing, canals-normal            Nose- +turbinate edema, no-Septal dev, mucus, polyps, erosion, perforation             Throat- Mallampati III , mucosa clear , drainage- none, tonsils- atrophic Neck- flexible , trachea midline, no stridor , thyroid nl, carotid no bruit Chest - symmetrical excursion , unlabored           Heart/CV- RRR to palpation now , no murmur , no gallop  , no rub, nl s1 s2                           -  JVD- none , edema- none, stasis changes- none, varices- none           Lung- clear to P&A, wheeze- none, cough- none , dullness-none, rub- none           Chest wall-  Abd- Br/ Gen/ Rectal- Not done, not indicated Extrem- cyanosis- none, clubbing, none, atrophy- none, strength- nl Neuro- grossly intact to observation

## 2013-11-09 NOTE — Patient Instructions (Signed)
My staff will request allergy labs and skin test results from [facility]- lab  Allergy profile           Dx asthma with bronchitis                    Mold panel profile  We can continue CPAP 10/ [facility]               Order- download for pressure compliance  Try otc Nasalcrom/ cromol/ cromolyn nasal spray

## 2013-11-10 LAB — ALLERGY FULL PROFILE
ALLERGEN, D PTERNOYSSINUS, D1: 14.9 kU/L — AB
Alternaria Alternata: 0.78 kU/L — ABNORMAL HIGH
Aspergillus fumigatus, m3: 0.46 kU/L — ABNORMAL HIGH
BOX ELDER: 10 kU/L — AB
Bahia Grass: 10.2 kU/L — ABNORMAL HIGH
Bermuda Grass: 11.4 kU/L — ABNORMAL HIGH
CURVULARIA LUNATA: 0.75 kU/L — AB
Candida Albicans: 0.97 kU/L — ABNORMAL HIGH
Cat Dander: 5.45 kU/L — ABNORMAL HIGH
Common Ragweed: 10.2 kU/L — ABNORMAL HIGH
D. farinae: 19.9 kU/L — ABNORMAL HIGH
DOG DANDER: 6.6 kU/L — AB
Elm IgE: 8.99 kU/L — ABNORMAL HIGH
FESCUE: 8.54 kU/L — AB
G005 Rye, Perennial: 9.45 kU/L — ABNORMAL HIGH
G009 Red Top: 9.6 kU/L — ABNORMAL HIGH
GOOSE FEATHERS: 0.69 kU/L — AB
Goldenrod: 12.9 kU/L — ABNORMAL HIGH
HELMINTHOSPORIUM HALODES: 0.48 kU/L — AB
House Dust Hollister: 4.2 kU/L — ABNORMAL HIGH
IGE (IMMUNOGLOBULIN E), SERUM: 3513.1 [IU]/mL — AB (ref 0.0–180.0)
Lamb's Quarters: 9.23 kU/L — ABNORMAL HIGH
OAK CLASS: 8.82 kU/L — AB
Plantain: 8.53 kU/L — ABNORMAL HIGH
STEMPHYLIUM BOTRYOSUM: 0.42 kU/L — AB
Sycamore Tree: 8.7 kU/L — ABNORMAL HIGH
TIMOTHY GRASS: 10 kU/L — AB

## 2013-11-10 LAB — MOLD PROFILE
ALTERNARIA ALTERNATA: 0.78 kU/L — AB
ASPERGILLUS FUMIGATUS M3: 0.46 kU/L — AB
Cladosporium Herbarum: 0.63 kU/L — ABNORMAL HIGH
HELMINTHOSPORIUM HALODES: 0.48 kU/L — AB
Penicillium Notatum: 0.3 kU/L — ABNORMAL HIGH

## 2013-11-13 ENCOUNTER — Telehealth: Payer: Medicare Other | Admitting: *Deleted

## 2013-11-13 DIAGNOSIS — I4892 Unspecified atrial flutter: Secondary | ICD-10-CM

## 2013-11-13 MED ORDER — DILTIAZEM HCL 30 MG PO TABS: 30.0000 mg | ORAL_TABLET | ORAL | Status: DC | PRN

## 2013-11-13 NOTE — Telephone Encounter (Signed)
Noted. I would also have her take an additional flecainide 100 mg tablet today (to make a total of 300 mg in 24 hours), as this might help her convert back from atrial fibrillation. At her convenience, please get a flecainide level as well to make sure that her standing dose is adequate.

## 2013-11-13 NOTE — Addendum Note (Signed)
Addended by: Merlene Laughter on: 11/13/2013 10:55 AM   Modules accepted: Orders

## 2013-11-13 NOTE — Telephone Encounter (Addendum)
Spoke with patient and she is concerned about her heart racing since last Thursday. Patient said that it ranges from 90-110. Patient has been using her prn 30 mg diltiazem at the most twice a day. Patient is concerned that her blood pressure is getting too low when taking the prn diltiazem. Patient said her BP is ranging 113/70. Patient said her weight is stable. Patient said that whenever she has the racing heart symptoms, she also has increase sob, and chest pain. Patient said this has been going on for several years now. Nurse advised patient that 113/70 was a great blood pressure. Nurse encouraged patient to take her prn 30 mg diltiazem every 6 hours as needed per instructions on her bottle. Nurse also advised patient to call our office back at the end of the week if her heart rate didn't improve or call if her symptoms got worse. Patient asked if she needed to do her usual walking exercise while her heart rate was unstable. Nurse advised patient that she should wait until her heart rate was more controlled. Please advise for further instructions.

## 2013-11-13 NOTE — Telephone Encounter (Signed)
Patient left a message on nurse's vm stating that her heart has been racing since last Thursday. Patient said she has only 2 diltiazem 30 mg left. Patient said when she takes the diltiazem, it makes her BP get too low at 113/?Marland Kitchen

## 2013-11-13 NOTE — Telephone Encounter (Addendum)
Patient informed and verbalized understanding of plan. Lab order faxed to [facility] lab. 

## 2013-11-14 ENCOUNTER — Telehealth: Payer: Self-pay | Admitting: *Deleted

## 2013-11-14 NOTE — Telephone Encounter (Signed)
Patient called to inform office that when she did her lab work this morning for her flecainide level, she had taken her medication already. Patient said she forgot about not taking it.

## 2013-11-15 ENCOUNTER — Telehealth: Payer: Self-pay | Admitting: Internal Medicine

## 2013-11-15 NOTE — Telephone Encounter (Signed)
Rec;d from [place] forward 15 pages to Dr. Annamaria Boots

## 2013-12-01 ENCOUNTER — Ambulatory Visit (INDEPENDENT_AMBULATORY_CARE_PROVIDER_SITE_OTHER): Payer: Medicare Other | Admitting: Cardiology

## 2013-12-01 ENCOUNTER — Encounter: Payer: Self-pay | Admitting: Cardiology

## 2013-12-01 ENCOUNTER — Ambulatory Visit: Payer: Medicare Other | Admitting: Cardiology

## 2013-12-01 VITALS — BP 144/76 | HR 46 | Ht 62.0 in | Wt 207.0 lb

## 2013-12-01 DIAGNOSIS — I4891 Unspecified atrial fibrillation: Secondary | ICD-10-CM

## 2013-12-01 DIAGNOSIS — G4733 Obstructive sleep apnea (adult) (pediatric): Secondary | ICD-10-CM

## 2013-12-01 DIAGNOSIS — I10 Essential (primary) hypertension: Secondary | ICD-10-CM

## 2013-12-01 DIAGNOSIS — I480 Paroxysmal atrial fibrillation: Secondary | ICD-10-CM

## 2013-12-01 NOTE — Assessment & Plan Note (Signed)
For now no change in regimen. She is focused on trying to lose some weight.

## 2013-12-01 NOTE — Assessment & Plan Note (Signed)
Patient reports new CPAP mask, doing much better.

## 2013-12-01 NOTE — Patient Instructions (Signed)
Continue all current medications. Follow up in  3 months 

## 2013-12-01 NOTE — Progress Notes (Signed)
Clinical Summary [name] [name] is a 73 y.o.female last seen in June. At that time she reported a feeling of generalized palpitations in July, came into the office for an ECG that showed sinus or possibly ectopic atrial rhythm, but not atrial fibrillation. She had another episode of palpitations in August, reporting racing heart rate and possible atrial fibrillation. We had her take an additional dose of flecainide. She states that her heart rate was in the low 100s for approximately a week, this has subsequently resolved. She is back to baseline.  I had her get a flecainide level checked recently on August 18, normal at 0.65 suggesting her dose is adequate.  She has been having more reflux symptoms, switched from Prilosec to Nexium by Dr. Manuella Ghazi. She also tells me that she has a new CPAP mask and is doing much better, is no longer having as many palpitations in the early morning.  Allergies  Allergen Reactions  . Bupropion Hcl Other (See Comments)    Suicidal Thoughts.   . Escitalopram Oxalate Other (See Comments)    Suicidal Thoughts.   . Fluticasone-Salmeterol Other (See Comments)    Caused patient to go into Afib.   Marland Kitchen Lisinopril   . Serevent Other (See Comments)    Caused patient to go into Afib  . Amoxicillin Rash  . Macrodantin [Nitrofurantoin] Rash  . Penicillins Rash    Current Outpatient Prescriptions  Medication Sig Dispense Refill  . albuterol (PROVENTIL HFA;VENTOLIN HFA) 108 (90 BASE) MCG/ACT inhaler Inhale 1-2 puffs into the lungs every 6 (six) hours as needed for wheezing or shortness of breath.      Marland Kitchen antiseptic oral rinse (BIOTENE) LIQD 15 mLs by Mouth Rinse route as needed (Dry Mouth).       . Biotin 1000 MCG tablet Take 1,000 mcg by mouth daily.      . budesonide (PULMICORT) 0.5 MG/2ML nebulizer solution Take 0.5 mg by nebulization daily.      Marland Kitchen CALCIUM-MAGNESIUM-ZINC PO Take 2 capsules by mouth daily.      Marland Kitchen diltiazem (CARDIZEM) 120 MG tablet TAKE 1/2 TABLET BY  MOUTH THREE TIMES DAILY  45 tablet  6  . diltiazem (CARDIZEM) 30 MG tablet Take 1 tablet (30 mg total) by mouth as needed.  30 tablet  2  . docusate-casanthranol (PERICOLACE) 100-30 MG per capsule Take 1 capsule by mouth daily as needed for constipation.       Marland Kitchen erythromycin (ERYPED 400) 400 MG/5ML suspension Take 2 tablets 1 hour before dental appt.       . flecainide (TAMBOCOR) 100 MG tablet TAKE 1 TABLET BY MOUTH TWICE DAILY  60 tablet  3  . fluticasone (FLONASE) 50 MCG/ACT nasal spray Place 2 sprays into the nose daily as needed for allergies.  16 g  prn  . montelukast (SINGULAIR) 10 MG tablet Take 10 mg by mouth at bedtime.      Marland Kitchen omeprazole (PRILOSEC) 20 MG capsule Take 1 capsule by mouth 2 (two) times daily.       . valsartan-hydrochlorothiazide (DIOVAN-HCT) 320-25 MG per tablet Take 1 tablet by mouth daily.      Marland Kitchen warfarin (COUMADIN) 5 MG tablet Take 5 mg by mouth daily. MANAGED BY DR. [facility]       No current facility-administered medications for this visit.    Past Medical History  Diagnosis Date  . Allergic rhinitis   . 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 Hyperlipidemia   .  Essential hypertension, benign   . Anxiety   . Depression   . Lumbar disc disease   . OSA (obstructive sleep apnea)     uses CPAP  . Atrial flutter   . Obesity   . Respiratory failure     2 recent hospitalizations for hypoxic respiratory failure/ reactive airway disease  . COPD (chronic obstructive pulmonary disease)     Social History [name] [name] reports that she has never smoked. She has never used smokeless tobacco. [name] [name] reports that she does not drink alcohol.  Review of Systems Other systems reviewed and negative except as outlined.  Physical Examination Filed Vitals:   12/01/13 1341  BP: 144/76  Pulse: 46   Filed Weights   12/01/13 1341  Weight: 207 lb (93.895 kg)    Overweight woman in no acute distress.  HEENT: Conjunctiva and lids  normal, oropharynx with moist mucosa.  Neck: Supple, no elevated JVP or bruits.  Lungs: Clear to auscultation, diminished, nonlabored.  Cardiac: Regular rate and rhythm, distant heart sounds, no significant systolic murmur.  Abdomen: Soft, nontender, bowel sounds present.  Extremities: No pitting edema.  Skin: Warm and dry.  Muscular skeletal: No kyphosis.  Neuropsychiatric: Alert and oriented x3, affect appropriate.   Problem List and Plan   Paroxysmal atrial fibrillation Plan is to continue current medical regimen, no changes were made.  OBSTRUCTIVE SLEEP APNEA Patient reports new CPAP mask, doing much better.  Essential hypertension, benign For now no change in regimen. She is focused on trying to lose some weight.    Satira Sark, M.D., F.A.C.C.

## 2013-12-01 NOTE — Assessment & Plan Note (Signed)
Plan is to continue current medical regimen, no changes were made.

## 2014-02-09 ENCOUNTER — Encounter: Payer: Self-pay | Admitting: Internal Medicine

## 2014-02-09 ENCOUNTER — Ambulatory Visit (INDEPENDENT_AMBULATORY_CARE_PROVIDER_SITE_OTHER): Payer: Medicare Other | Admitting: Internal Medicine

## 2014-02-09 VITALS — BP 130/60 | HR 46 | Ht 64.0 in | Wt 206.6 lb

## 2014-02-09 DIAGNOSIS — J4520 Mild intermittent asthma, uncomplicated: Secondary | ICD-10-CM

## 2014-02-09 DIAGNOSIS — I495 Sick sinus syndrome: Secondary | ICD-10-CM

## 2014-02-09 DIAGNOSIS — G4733 Obstructive sleep apnea (adult) (pediatric): Secondary | ICD-10-CM

## 2014-02-09 DIAGNOSIS — R001 Bradycardia, unspecified: Secondary | ICD-10-CM

## 2014-02-09 NOTE — Assessment & Plan Note (Signed)
Pulse slow on arrival- EKG done This is a little lower than her usual range.  Plan- if symptomatic as discussed , she will call her cardiologist for advice. Has pending appt in december

## 2014-02-09 NOTE — Progress Notes (Signed)
05/06/11- 70 yoF never smoker followed for OSA, allergic rhinitis, complicated by AFib/coumadin LOV 05/05/10 She has had more persistent problems with nasal stuffiness and congestion and postnasal drip with global headache. This interferes with her ability to wear CPAP comfortably. She is using a fullface mask from [place] but she doesn't know her pressure. Ongoing problem with dryness of the eyes and nose. Since last here has had hip and back surgeries, treatment for atrial fibrillation with Coumadin.  10/04/12- 36 yoF never smoker followed for OSA, allergic rhinitis, complicated by AFib/coumadin FOLLOWS FOR: request new rx for flonase.  also c/o sinus congestion, PND, increased SOB, wheezing, throat congestion, prod cough in the AM with thick green mucus w/ the humidity CPAP 10/ [employer] Ran out of Flonase-increased nasal congestion. Albuterol causes tremor. AFib is controlled. Since last here had both hips replaced and lumbar spine surgery. No respiratory complications during surgeries.  11/09/13-  73 yoF never smoker followed for OSA, allergic rhinitis, complicated by AFib/coumadin FOLLOWS FOR:Dec 2014-had cough so bad and heart beating so fast(has Afib) 02-2013 through 04-2013; spent 5 days in [place] to help stop cough to correct heart. Another hospital stay in Jan2015 for cough again-Dr Koleen Nimrod [facility], [place]) saw her and did 3 IgE tests that show levels above 1800 and determined that allergies were the cause. Had allergy test at [facility];Dr Manuella Ghazi office to give injections(had reactions and stopped). [facility] told her to take antihistamines BID while on injections.   02/09/14- 73 yoF never smoker followed for OSA, allergic rhinitis, complicated by AFib/coumadin FOLLOWS FOR:Pt states she is dong pretty well overall; cough and wheezing(? GERD). Continues to wear CPAP 10/ [facility] every night (will need to order download and enroll in [place])   Has had flu and pneumonia vaccine by  her report Reports A. Fib 1 week ago. Today stood up from water fountain, felt weak and lightheaded on arrival. Pulse was 44. EKG shows sinus bradycardia. She brings a record of pulses, blood pressures and peak flow scores since mid October. Pulse rate usually 50-70. Peak flows usually 280-300. EKG- SB 44/ min on cardizem 1/2 x 120 mg TID, flecainide100 BID Breathing has been well controlled. Occasionally wakes with congestion and phlegm she blames on reflux. Rhinitis managed with Nasalcrom plus Flonase  ROS-see HPI Constitutional:   No-   weight loss, night sweats, fevers, chills, fatigue, lassitude. HEENT:   No-  headaches, difficulty swallowing, tooth/dental problems, sore throat,       No-  sneezing, itching,  +ear ache,  +nasal congestion, post nasal drip,  CV:  No-   chest pain, orthopnea, PND, swelling in lower extremities, anasarca, dizziness, palpitations Resp: +  shortness of breath with exertion or at rest.              +  productive cough,  No non-productive cough,  No- coughing up of blood.              No-change in color of mucus.  +No- wheezing.   Skin: No-   rash or lesions. GI:  No-   heartburn, indigestion, abdominal pain, nausea, vomiting, GU:  MS:  No-   joint pain or swelling. . Neuro-     nothing unusual Psych:  No- change in mood or affect. No depression or anxiety.  No memory loss.   OBJ- Physical Exam General- Alert, Oriented, Affect-appropriate, Distress- none acute, + overweight Skin- rash-none, lesions- none, excoriation- none Lymphadenopathy- none Head- atraumatic  Eyes- Gross vision intact, PERRLA, conjunctivae and secretions clear            Ears- Hearing, canals-normal            Nose- +turbinate edema, no-Septal dev, mucus, polyps, erosion, perforation             Throat- Mallampati III , mucosa clear , drainage- none, tonsils- atrophic Neck- flexible , trachea midline, no stridor , thyroid nl, carotid no bruit Chest - symmetrical excursion  , unlabored           Heart/CV- RRR/ 52 to my palpation now , no murmur , no gallop  , no rub, nl                          s1 s2                  - JVD- none , edema- none, stasis changes- none, varices- none           Lung- clear to P&A, wheeze- none, cough- none , dullness-none, rub- none           Chest wall-  Abd- Br/ Gen/ Rectal- Not done, not indicated Extrem- cyanosis- none, clubbing, none, atrophy- none, strength- nl Neuro- grossly intact to observation

## 2014-02-09 NOTE — Assessment & Plan Note (Signed)
Controlled.  

## 2014-02-09 NOTE — Assessment & Plan Note (Signed)
Good compliance and control on 10 cwp/ [employer]. No changes indicated

## 2014-02-09 NOTE — Patient Instructions (Signed)
Please let Dr Domenic Polite know if you feel you are having problems with your heart rate or blood pressure  We can continue present meds  Consider wearing a dust mask if you are working with hay  Consider trying the Breath Right nasal strips under your CPAP at night if you feel stuffy in your nose  We can continue CPAP 10/ [facility]  Please call as needed

## 2014-02-18 ENCOUNTER — Other Ambulatory Visit: Payer: Self-pay | Admitting: Cardiology

## 2014-03-06 ENCOUNTER — Ambulatory Visit: Payer: Medicare Other | Admitting: Cardiology

## 2014-03-08 ENCOUNTER — Ambulatory Visit: Payer: Medicare Other | Admitting: Cardiology

## 2014-03-09 ENCOUNTER — Encounter: Payer: Self-pay | Admitting: Cardiology

## 2014-03-09 ENCOUNTER — Ambulatory Visit (INDEPENDENT_AMBULATORY_CARE_PROVIDER_SITE_OTHER): Payer: Medicare Other | Admitting: Cardiology

## 2014-03-09 VITALS — BP 125/59 | HR 63 | Ht 62.5 in | Wt 203.0 lb

## 2014-03-09 DIAGNOSIS — I10 Essential (primary) hypertension: Secondary | ICD-10-CM

## 2014-03-09 DIAGNOSIS — I480 Paroxysmal atrial fibrillation: Secondary | ICD-10-CM

## 2014-03-09 NOTE — Assessment & Plan Note (Signed)
Blood pressure control is good today. 

## 2014-03-09 NOTE — Patient Instructions (Signed)
Your physician recommends that you schedule a follow-up appointment in: 4 months. You will receive a reminder letter in the mail in about 2 months reminding you to call and schedule your appointment. If you don't receive this letter, please contact our office. Your physician recommends that you continue on your current medications as directed. Please refer to the Current Medication list given to you today. 

## 2014-03-09 NOTE — Progress Notes (Signed)
Reason for visit: Atrial fibrillation, sick sinus syndrome  Clinical Summary Ms. Berrong is a 73 y.o.female last seen in September. She presents for a routine visit. Reports one episode of probable atrial fibrillation back on November 7, I reviewed her home blood pressure and heart rate checks. This resolved by using extra short acting Cardizem doses. Otherwise she has had no change in her medications, has continued exercise until straining her right leg doing extensions. She is using a cane at this time.  No bleeding problems on Coumadin. She continues to follow this with Dr. Manuella Ghazi.   Allergies  Allergen Reactions  . Bupropion Hcl Other (See Comments)    Suicidal Thoughts.   . Escitalopram Oxalate Other (See Comments)    Suicidal Thoughts.   . Fluticasone-Salmeterol Other (See Comments)    Caused patient to go into Afib.   Marland Kitchen Lisinopril   . Serevent Other (See Comments)    Caused patient to go into Afib  . Amoxicillin Rash  . Macrodantin [Nitrofurantoin] Rash  . Penicillins Rash    Current Outpatient Prescriptions  Medication Sig Dispense Refill  . antiseptic oral rinse (BIOTENE) LIQD 15 mLs by Mouth Rinse route as needed (Dry Mouth).     . Biotin 1000 MCG tablet Take 1,000 mcg by mouth daily.    . budesonide (PULMICORT) 0.5 MG/2ML nebulizer solution Take 0.5 mg by nebulization daily.    Marland Kitchen CALCIUM-MAGNESIUM-ZINC PO Take 2 capsules by mouth daily.    . cromolyn (NASALCROM) 5.2 MG/ACT nasal spray Place 1 spray into both nostrils. Every 4-6 hours prn    . diltiazem (CARDIZEM) 120 MG tablet TAKE 1/2 TABLET BY MOUTH THREE TIMES DAILY 45 tablet 6  . diltiazem (CARDIZEM) 30 MG tablet Take 1 tablet (30 mg total) by mouth as needed. 30 tablet 2  . docusate-casanthranol (PERICOLACE) 100-30 MG per capsule Take 1 capsule by mouth daily as needed for constipation.     Marland Kitchen erythromycin (ERYPED 400) 400 MG/5ML suspension Take 2 tablets 1 hour before dental appt.     . esomeprazole (NEXIUM 24HR)  20 MG capsule Take 20 mg by mouth daily at 12 noon.    . flecainide (TAMBOCOR) 100 MG tablet TAKE 1 TABLET BY MOUTH TWICE DAILY 60 tablet 4  . fluticasone (FLONASE) 50 MCG/ACT nasal spray Place 2 sprays into the nose daily as needed for allergies. 16 g prn  . montelukast (SINGULAIR) 10 MG tablet Take 10 mg by mouth at bedtime.    Marland Kitchen omeprazole (PRILOSEC) 20 MG capsule Take 1 capsule by mouth daily.     . valsartan-hydrochlorothiazide (DIOVAN-HCT) 320-25 MG per tablet Take 1 tablet by mouth daily.    Marland Kitchen warfarin (COUMADIN) 5 MG tablet Take 5 mg by mouth daily. MANAGED BY DR. [facility]     No current facility-administered medications for this visit.    Past Medical History  Diagnosis Date  . Allergic rhinitis   . 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 Hyperlipidemia   . Essential hypertension, benign   . Anxiety   . Depression   . Lumbar disc disease   . OSA (obstructive sleep apnea)     uses CPAP  . Atrial flutter   . Obesity   . Respiratory failure     2 recent hospitalizations for hypoxic respiratory failure/ reactive airway disease  . COPD (chronic obstructive pulmonary disease)     Social History Ms. Schlereth reports that she has never smoked. She has never used  smokeless tobacco. Ms. Facer reports that she does not drink alcohol.  Review of Systems Complete review of systems negative except as otherwise outlined in the clinical summary.  Physical Examination Filed Vitals:   03/09/14 1436  BP: 125/59  Pulse: 63   Filed Weights   03/09/14 1436  Weight: 203 lb (92.08 kg)   Overweight woman in no acute distress.  HEENT: Conjunctiva and lids normal, oropharynx with moist mucosa.  Neck: Supple, no elevated JVP or bruits.  Lungs: Clear to auscultation, diminished, nonlabored.  Cardiac: Regular rate and rhythm, distant heart sounds, no significant systolic murmur.  Abdomen: Soft, nontender, bowel sounds present.  Extremities: No  pitting edema.    Problem List and Plan   Paroxysmal atrial fibrillation Satisfactorily controlled at this time. No changes in current regimen. Follow-up in 4 months.  Essential hypertension, benign Blood pressure control is good today.    Satira Sark, M.D., F.A.C.C.

## 2014-03-09 NOTE — Assessment & Plan Note (Signed)
Satisfactorily controlled at this time. No changes in current regimen. Follow-up in 4 months.

## 2014-03-26 ENCOUNTER — Encounter: Payer: Self-pay | Admitting: Internal Medicine

## 2014-03-26 ENCOUNTER — Ambulatory Visit (INDEPENDENT_AMBULATORY_CARE_PROVIDER_SITE_OTHER): Payer: Medicare Other | Admitting: Internal Medicine

## 2014-03-26 ENCOUNTER — Telehealth: Payer: Self-pay | Admitting: Cardiology

## 2014-03-26 VITALS — BP 126/90 | HR 104 | Ht 62.5 in | Wt 202.0 lb

## 2014-03-26 DIAGNOSIS — I480 Paroxysmal atrial fibrillation: Secondary | ICD-10-CM

## 2014-03-26 DIAGNOSIS — R0602 Shortness of breath: Secondary | ICD-10-CM

## 2014-03-26 DIAGNOSIS — I4892 Unspecified atrial flutter: Secondary | ICD-10-CM

## 2014-03-26 NOTE — Patient Instructions (Addendum)
Your physician recommends that you schedule a follow-up appointment in: 1 month with Dr.McDowell after cardioversion on Wednesday    Please take extra Flecainide 100 mg  tablet when you get home and then take regular dose tonight   Your physician has requested that you have a TEE/Cardioversion on Wednesday, December at Short Stay at [facility], arrive at 9:30am. During a TEE, sound waves are used to create images of your heart. It provides your doctor with information about the size and shape of your heart and how well your heart's chambers and valves are working. In this test, a transducer is attached to the end of a flexible tube that is guided down you throat and into your esophagus (the tube leading from your mouth to your stomach) to get a more detailed image of your heart. Once the TEE has determined that a blood clot is not present, the cardioversion begins. Electrical Cardioversion uses a jolt of electricity to your heart either through paddles or wired patches attached to your chest. This is a controlled, usually prescheduled, procedure. This procedure is done at the hospital and you are not awake during the procedure. You usually go home the day of the procedure. Please see the instruction sheet given to you today for more information.    Please get lab work NOW      Thank you for [place] !

## 2014-03-26 NOTE — Progress Notes (Signed)
HPI  Patient is a 73 yo who is normally followed by Myles Gip  History of atrial flutter, atrial fib and sick sinus syndrome She was last seen in November. She called this AM saying that she felt like she was back in Afib  Notes HR increased Saturday night   Patinet took an extra dilt with no effect She says when she is in this She gets very tired.  SOB  Doe have some chest tightness/back tightness,  She has had this when in afib in the past  Denies dizziness  No SOB at rest   Allergies  Allergen Reactions  . Bupropion Hcl Other (See Comments)    Suicidal Thoughts.   . Escitalopram Oxalate Other (See Comments)    Suicidal Thoughts.   . Fluticasone-Salmeterol Other (See Comments)    Caused patient to go into Afib.   Marland Kitchen Lisinopril   . Serevent Other (See Comments)    Caused patient to go into Afib  . Amoxicillin Rash  . Macrodantin [Nitrofurantoin] Rash  . Penicillins Rash    Current Outpatient Prescriptions  Medication Sig Dispense Refill  . antiseptic oral rinse (BIOTENE) LIQD 15 mLs by Mouth Rinse route as needed (Dry Mouth).     . Biotin 1000 MCG tablet Take 1,000 mcg by mouth daily.    . budesonide (PULMICORT) 0.5 MG/2ML nebulizer solution Take 0.5 mg by nebulization daily.    Marland Kitchen CALCIUM-MAGNESIUM-ZINC PO Take 2 capsules by mouth daily.    . cromolyn (NASALCROM) 5.2 MG/ACT nasal spray Place 1 spray into both nostrils. Every 4-6 hours prn    . diltiazem (CARDIZEM) 120 MG tablet TAKE 1/2 TABLET BY MOUTH THREE TIMES DAILY 45 tablet 6  . diltiazem (CARDIZEM) 30 MG tablet Take 1 tablet (30 mg total) by mouth as needed. 30 tablet 2  . docusate-casanthranol (PERICOLACE) 100-30 MG per capsule Take 1 capsule by mouth daily as needed for constipation.     Marland Kitchen esomeprazole (NEXIUM 24HR) 20 MG capsule Take 20 mg by mouth daily at 12 noon.    . flecainide (TAMBOCOR) 100 MG tablet TAKE 1 TABLET BY MOUTH TWICE DAILY 60 tablet 4  . fluticasone (FLONASE) 50 MCG/ACT nasal spray Place 2 sprays into  the nose daily as needed for allergies. 16 g prn  . montelukast (SINGULAIR) 10 MG tablet Take 10 mg by mouth at bedtime.    Marland Kitchen omeprazole (PRILOSEC) 20 MG capsule Take 1 capsule by mouth daily.     . valsartan-hydrochlorothiazide (DIOVAN-HCT) 320-25 MG per tablet Take 1 tablet by mouth daily.    Marland Kitchen warfarin (COUMADIN) 5 MG tablet Take 5 mg by mouth daily. MANAGED BY DR. [facility]     No current facility-administered medications for this visit.    Past Medical History  Diagnosis Date  . Allergic rhinitis   . 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 Hyperlipidemia   . Essential hypertension, benign   . Anxiety   . Depression   . Lumbar disc disease   . OSA (obstructive sleep apnea)     uses CPAP  . Atrial flutter   . Obesity   . Respiratory failure     2 recent hospitalizations for hypoxic respiratory failure/ reactive airway disease  . COPD (chronic obstructive pulmonary disease)     Past Surgical History  Procedure Laterality Date  . Cataract extraction    . Lasik      Both eyes  . 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 Anterior fusion lumbar spine    . Colonoscopy N/A 08/04/2012    Procedure: COLONOSCOPY;  Surgeon: Rogene Houston, MD;  [facility];  Service: Endoscopy;  Laterality: N/A;  730-rescheduled to 61 Ann notified pt    Family History  Problem Relation Age of Onset  . Cancer Mother   . Heart attack Father   . Colon cancer Other     History   Social History  . Marital Status: Single    Spouse Name: N/A    Number of Children: N/A  . Years of Education: N/A   Occupational History  . Retired: Presenter, broadcasting    Social History Main Topics  . Smoking status: Never Smoker   . Smokeless tobacco: Never Used  . Alcohol Use: No  . Drug Use: No  . Sexual Activity: Not on file   Other Topics Concern  . Not on file   Social History Narrative   Pt lives in [place] alone. She was never married.   Retired Customer service manager.    Attends [employer]    Review of Systems:  All systems reviewed.  They are negative to the above problem except as previously stated.  Vital Signs: BP 126/90 mmHg  Pulse 104  Ht 5' 2.5" (1.588 m)  Wt 202 lb (91.627 kg)  BMI 36.33 kg/m2  SpO2 97%  Physical Exam Patient is a morbidly obese 73 yo in NAD   HEENT:  Normocephalic, atraumatic. EOMI, PERRLA.  Neck: JVP is normal.  No bruits.  Lungs: clear to auscultation. No rales no wheezes.  Heart: Distant HS  Regular rate and rhythm. Normal S1, S2. No S3.   No significant murmurs. PMI not displaced.  Abdomen:  Supple, nontender. Normal bowel sounds. No masses. No hepatomegaly.  Extremities:   Good distal pulses throughout. Tr lower extremity edema.  Musculoskeletal :moving all extremities.  Neuro:   alert and oriented x3.  CN II-XII grossly intact.  EKG  Atrial flutter 104 bpm   Assessment and Plan:  1.  Atrial flutter  On flecanide  Reviewed with EP  Would recommm that the patient take an extra flecanide today  May convert. If doesn't will plan cardioversion with poss TEE if INR today not therapeutic.  Note INR at Dr Raul Del office was 2.9 on 02/02/14 and 2.8 on 03/13/14 Get other labs today.  2.  Atrial fib  Continue meds.    3.  HTN  Follow  4.  Chest tightness  Has had any time she has been in afib in past.  Fatiue  I would follow  I am not convinced represents angina.

## 2014-03-26 NOTE — Telephone Encounter (Signed)
[name] [name] that she has been in AFIB since Saturday. Called [facility] and got her in with Dr Harrington Challenger for today at 1:40 due to our office not having availibilty

## 2014-03-27 ENCOUNTER — Telehealth: Payer: Self-pay

## 2014-03-27 LAB — CBC
HCT: 42.5 % (ref 36.0–46.0)
Hemoglobin: 13.8 g/dL (ref 12.0–15.0)
MCH: 26.8 pg (ref 26.0–34.0)
MCHC: 32.5 g/dL (ref 30.0–36.0)
MCV: 82.7 fL (ref 78.0–100.0)
MPV: 11 fL (ref 9.4–12.4)
PLATELETS: 261 10*3/uL (ref 150–400)
RBC: 5.14 MIL/uL — ABNORMAL HIGH (ref 3.87–5.11)
RDW: 14.9 % (ref 11.5–15.5)
WBC: 9 10*3/uL (ref 4.0–10.5)

## 2014-03-27 LAB — BASIC METABOLIC PANEL
BUN: 19 mg/dL (ref 6–23)
CALCIUM: 9.8 mg/dL (ref 8.4–10.5)
CHLORIDE: 102 meq/L (ref 96–112)
CO2: 27 meq/L (ref 19–32)
Creat: 1.06 mg/dL (ref 0.50–1.10)
Glucose, Bld: 96 mg/dL (ref 70–99)
POTASSIUM: 4.2 meq/L (ref 3.5–5.3)
Sodium: 140 mEq/L (ref 135–145)

## 2014-03-27 LAB — PROTIME-INR
INR: 2.14 — AB (ref <1.50)
PROTHROMBIN TIME: 23.9 s — AB (ref 11.6–15.2)

## 2014-03-27 LAB — BRAIN NATRIURETIC PEPTIDE: Brain Natriuretic Peptide: 141.5 pg/mL — ABNORMAL HIGH (ref 0.0–100.0)

## 2014-03-27 NOTE — Telephone Encounter (Signed)
Pt called to say she was still tachycardic at 110.Per Dr.Ross,pt should take extra Coumadin 2.5 mg now and then regular dose of 5 mg this evening.INR from yesterday was 2.14 Dr.Ross also changed procedure from TEE to regular cardioversion, Kerin Ransom made aware.

## 2014-03-27 NOTE — Telephone Encounter (Signed)
-----   Message from Bernita Raisin, RN sent at 03/26/2014  3:00 PM EST ----- Regarding: back in SR ? Back in SR,then cx TEE on wed

## 2014-03-28 ENCOUNTER — Ambulatory Visit (HOSPITAL_COMMUNITY): Payer: Medicare Other | Admitting: Anesthesiology

## 2014-03-28 ENCOUNTER — Ambulatory Visit (HOSPITAL_COMMUNITY)
Admission: RE | Admit: 2014-03-28 | Discharge: 2014-03-28 | Disposition: A | Discharge status: Home / Self Care | Payer: Medicare Other | Source: Ambulatory Visit | Attending: Internal Medicine | Admitting: Internal Medicine

## 2014-03-28 ENCOUNTER — Encounter (HOSPITAL_COMMUNITY)
Admission: RE | Disposition: A | Discharge status: Home / Self Care | Payer: Self-pay | Source: Ambulatory Visit | Attending: Internal Medicine

## 2014-03-28 ENCOUNTER — Other Ambulatory Visit (HOSPITAL_COMMUNITY): Payer: Medicare Other

## 2014-03-28 ENCOUNTER — Encounter (HOSPITAL_COMMUNITY): Payer: Self-pay | Admitting: *Deleted

## 2014-03-28 DIAGNOSIS — J449 Chronic obstructive pulmonary disease, unspecified: Secondary | ICD-10-CM | POA: Insufficient documentation

## 2014-03-28 DIAGNOSIS — Z881 Allergy status to other antibiotic agents status: Secondary | ICD-10-CM | POA: Diagnosis not present

## 2014-03-28 DIAGNOSIS — I10 Essential (primary) hypertension: Secondary | ICD-10-CM | POA: Insufficient documentation

## 2014-03-28 DIAGNOSIS — I4891 Unspecified atrial fibrillation: Secondary | ICD-10-CM | POA: Insufficient documentation

## 2014-03-28 DIAGNOSIS — I4892 Unspecified atrial flutter: Secondary | ICD-10-CM | POA: Insufficient documentation

## 2014-03-28 DIAGNOSIS — J45909 Unspecified asthma, uncomplicated: Secondary | ICD-10-CM | POA: Insufficient documentation

## 2014-03-28 DIAGNOSIS — Z800 Family history of malignant neoplasm of digestive organs: Secondary | ICD-10-CM | POA: Insufficient documentation

## 2014-03-28 DIAGNOSIS — G4733 Obstructive sleep apnea (adult) (pediatric): Secondary | ICD-10-CM | POA: Diagnosis not present

## 2014-03-28 DIAGNOSIS — F419 Anxiety disorder, unspecified: Secondary | ICD-10-CM | POA: Diagnosis not present

## 2014-03-28 DIAGNOSIS — I481 Persistent atrial fibrillation: Secondary | ICD-10-CM

## 2014-03-28 DIAGNOSIS — I4819 Other persistent atrial fibrillation: Secondary | ICD-10-CM | POA: Insufficient documentation

## 2014-03-28 DIAGNOSIS — Z888 Allergy status to other drugs, medicaments and biological substances status: Secondary | ICD-10-CM | POA: Diagnosis not present

## 2014-03-28 DIAGNOSIS — Z6836 Body mass index (BMI) 36.0-36.9, adult: Secondary | ICD-10-CM | POA: Diagnosis not present

## 2014-03-28 DIAGNOSIS — Z8249 Family history of ischemic heart disease and other diseases of the circulatory system: Secondary | ICD-10-CM | POA: Diagnosis not present

## 2014-03-28 DIAGNOSIS — E669 Obesity, unspecified: Secondary | ICD-10-CM | POA: Insufficient documentation

## 2014-03-28 DIAGNOSIS — E785 Hyperlipidemia, unspecified: Secondary | ICD-10-CM | POA: Diagnosis not present

## 2014-03-28 DIAGNOSIS — M4646 Discitis, unspecified, lumbar region: Secondary | ICD-10-CM | POA: Diagnosis not present

## 2014-03-28 DIAGNOSIS — Z96649 Presence of unspecified artificial hip joint: Secondary | ICD-10-CM | POA: Diagnosis not present

## 2014-03-28 DIAGNOSIS — F329 Major depressive disorder, single episode, unspecified: Secondary | ICD-10-CM | POA: Insufficient documentation

## 2014-03-28 DIAGNOSIS — Z880 Allergy status to penicillin: Secondary | ICD-10-CM | POA: Insufficient documentation

## 2014-03-28 HISTORY — PX: CARDIOVERSION: SHX1299

## 2014-03-28 SURGERY — CARDIOVERSION
Anesthesia: Monitor Anesthesia Care

## 2014-03-28 MED ORDER — SODIUM CHLORIDE 0.9 % IV SOLN
INTRAVENOUS | Status: DC
  Administered 2014-03-28: 10:00:00 via INTRAVENOUS

## 2014-03-28 MED ORDER — FENTANYL CITRATE 0.05 MG/ML IJ SOLN
25.0000 ug | INTRAMUSCULAR | Status: AC
  Administered 2014-03-28 (×2): 25 ug via INTRAVENOUS

## 2014-03-28 MED ORDER — MIDAZOLAM HCL 2 MG/2ML IJ SOLN
INTRAMUSCULAR | Status: AC
  Filled 2014-03-28: qty 2

## 2014-03-28 MED ORDER — MIDAZOLAM HCL 5 MG/5ML IJ SOLN
INTRAMUSCULAR | Status: DC | PRN
  Administered 2014-03-28: 2 mg via INTRAVENOUS

## 2014-03-28 MED ORDER — FENTANYL CITRATE 0.05 MG/ML IJ SOLN
INTRAMUSCULAR | Status: AC
  Filled 2014-03-28: qty 2

## 2014-03-28 MED ORDER — PROPOFOL 10 MG/ML IV EMUL
INTRAVENOUS | Status: AC
  Filled 2014-03-28: qty 20

## 2014-03-28 MED ORDER — LACTATED RINGERS IV SOLN: INTRAVENOUS | Status: DC | PRN

## 2014-03-28 MED ORDER — ONDANSETRON HCL 4 MG/2ML IJ SOLN
4.0000 mg | Freq: Once | INTRAMUSCULAR | Status: AC
  Administered 2014-03-28: 4 mg via INTRAVENOUS

## 2014-03-28 MED ORDER — ONDANSETRON HCL 4 MG/2ML IJ SOLN
INTRAMUSCULAR | Status: AC
  Filled 2014-03-28: qty 2

## 2014-03-28 MED ORDER — PROPOFOL INFUSION 10 MG/ML OPTIME
INTRAVENOUS | Status: DC | PRN
  Administered 2014-03-28: 100 ug/kg/min via INTRAVENOUS

## 2014-03-28 MED ORDER — LIDOCAINE HCL (PF) 1 % IJ SOLN
INTRAMUSCULAR | Status: AC
  Filled 2014-03-28: qty 5

## 2014-03-28 NOTE — Anesthesia Preprocedure Evaluation (Signed)
Anesthesia Evaluation  Patient identified by MRN, date of birth, ID band Patient awake    Reviewed: Allergy & Precautions, H&P , NPO status , Patient's Chart, lab work & pertinent test results  Airway Mallampati: II  TM Distance: >3 FB     Dental  (+) Teeth Intact   Pulmonary asthma , sleep apnea , COPD breath sounds clear to auscultation        Cardiovascular hypertension, Pt. on medications + dysrhythmias Atrial Fibrillation Rhythm:Regular Rate:Normal     Neuro/Psych PSYCHIATRIC DISORDERS Anxiety Depression    GI/Hepatic GERD-  Medicated,  Endo/Other  Morbid obesity  Renal/GU      Musculoskeletal   Abdominal   Peds  Hematology   Anesthesia Other Findings   Reproductive/Obstetrics                             Anesthesia Physical Anesthesia Plan  ASA: III  Anesthesia Plan: MAC   Post-op Pain Management:    Induction: Intravenous  Airway Management Planned: Simple Face Mask  Additional Equipment:   Intra-op Plan:   Post-operative Plan:   Informed Consent: I have reviewed the patients History and Physical, chart, labs and discussed the procedure including the risks, benefits and alternatives for the proposed anesthesia with the patient or authorized representative who has indicated his/her understanding and acceptance.     Plan Discussed with:   Anesthesia Plan Comments:         Anesthesia Quick Evaluation

## 2014-03-28 NOTE — Progress Notes (Signed)
Electrical Cardioversion Procedure Note [name] [name] [id number] [birth year]  Procedure: Electrical Cardioversion Indications:  Atrial Flutter  Procedure Details Consent: Risks of procedure as well as the alternatives and risks of each were explained to the (patient/caregiver).  Consent for procedure obtained. Time Out: Verified patient identification, verified procedure, site/side was marked, verified correct patient position, special equipment/implants available, medications/allergies/relevent history reviewed, required imaging and test results available.  Performed  Patient placed on cardiac monitor, pulse oximetry, supplemental oxygen as necessary.  Sedation given: Fentanyl 50 mcg, Versed 2mg , Propofol 40mg  Pacer pads placed anterior chest.  Cardioverted 3 time(s).  Cardioverted at [place].  Evaluation Findings: Post procedure EKG shows: Sinus Bradycardia with sinus arrhythmia with 1st degree AV block Complications: None Patient did tolerate procedure well.   Charm Barges S 03/28/2014, 10:56 AM

## 2014-03-28 NOTE — Transfer of Care (Signed)
Immediate Anesthesia Transfer of Care Note  Patient: [name] [name]  Procedure(s) Performed: Procedure(s): CARDIOVERSION (N/A)  Patient Location: PACU  Anesthesia Type:MAC  Level of Consciousness: awake, alert , oriented and patient cooperative  Airway & Oxygen Therapy: Patient Spontanous Breathing and Patient connected to face mask oxygen  Post-op Assessment: Report given to PACU RN and Post -op Vital signs reviewed and stable  Post vital signs: Reviewed and stable  Complications: No apparent anesthesia complications

## 2014-03-28 NOTE — Anesthesia Postprocedure Evaluation (Signed)
  Anesthesia Post-op Note  Patient: [name] [name]  Procedure(s) Performed: Procedure(s): CARDIOVERSION (N/A)  Patient Location: PACU  Anesthesia Type:MAC  Level of Consciousness: awake, alert , oriented and patient cooperative  Airway and Oxygen Therapy: Patient Spontanous Breathing and Patient connected to face mask oxygen  Post-op Pain: none  Post-op Assessment: Post-op Vital signs reviewed, Patient's Cardiovascular Status Stable, Respiratory Function Stable, Patent Airway, No signs of Nausea or vomiting and Pain level controlled  Post-op Vital Signs: Reviewed and stable  Last Vitals:  Filed Vitals:   03/28/14 1010  BP: 106/74  Pulse:   Temp:   Resp: 58    Complications: No apparent anesthesia complications

## 2014-03-28 NOTE — Addendum Note (Signed)
Addendum  created 03/28/14 1052 by Mickel Baas, CRNA   Modules edited: Charges VN

## 2014-03-28 NOTE — Interval H&P Note (Signed)
History and Physical Interval Note:  03/28/2014 10:07 AM  [name] [name]  has presented today for surgery, with the diagnosis of afib  The various methods of treatment have been discussed with the patient and family. After consideration of risks, benefits and other options for treatment, the patient has consented to  Procedure(s): CARDIOVERSION (N/A) as a surgical intervention .  The patient's history has been reviewed, patient examined, no change in status, stable for surgery.  I have reviewed the patient's chart and labs.  Questions were answered to the patient's satisfaction.     [name]

## 2014-03-28 NOTE — Discharge Instructions (Signed)
Electrical Cardioversion, Care After Refer to this sheet in the next few weeks. These instructions provide you with information on caring for yourself after your procedure. Your health care provider may also give you more specific instructions. Your treatment has been planned according to current medical practices, but problems sometimes occur. Call your health care provider if you have any problems or questions after your procedure. WHAT TO EXPECT AFTER THE PROCEDURE After your procedure, it is typical to have the following sensations:  Some redness on the skin where the shocks were delivered. If this is tender, a sunburn lotion or hydrocortisone cream may help.  Possible return of an abnormal heart rhythm within hours or days after the procedure. HOME CARE INSTRUCTIONS  Take medicines only as directed by your health care provider. Be sure you understand how and when to take your medicine.  Learn how to feel your pulse and check it often.  Limit your activity for 48 hours after the procedure or as directed by your health care provider.  Avoid or minimize caffeine and other stimulants as directed by your health care provider. SEEK MEDICAL CARE IF:  You feel like your heart is beating too fast or your pulse is not regular.  You have any questions about your medicines.  You have bleeding that will not stop. SEEK IMMEDIATE MEDICAL CARE IF:  You are dizzy or feel faint.  It is hard to breathe or you feel short of breath.  There is a change in discomfort in your chest.  Your speech is slurred or you have trouble moving an arm or leg on one side of your body.  You get a serious muscle cramp that does not go away.  Your fingers or toes turn cold or blue.  PATIENT INSTRUCTIONS POST-ANESTHESIA  IMMEDIATELY FOLLOWING SURGERY:  Do not drive or operate machinery for the first twenty four hours after surgery.  Do not make any important decisions for twenty four hours after surgery or  while taking narcotic pain medications or sedatives.  If you develop intractable nausea and vomiting or a severe headache please notify your doctor immediately.  FOLLOW-UP:  Please make an appointment with your surgeon as instructed. You do not need to follow up with anesthesia unless specifically instructed to do so.  WOUND CARE INSTRUCTIONS (if applicable):  Keep a dry clean dressing on the anesthesia/puncture wound site if there is drainage.  Once the wound has quit draining you may leave it open to air.  Generally you should leave the bandage intact for twenty four hours unless there is drainage.  If the epidural site drains for more than 36-48 hours please call the anesthesia department.  QUESTIONS?:  Please feel free to call your physician or the hospital operator if you have any questions, and they will be happy to assist you.

## 2014-03-28 NOTE — Op Note (Signed)
Cardioversion  Patient anesthetized with 2 mg Versed, 50 mcg fentanyl and 40 mg Propofol  Initially with pads in AP position cardioversion attempted with 200 J synchronized biphasic energy  Unsuccessful  This was repeated with addition of chest compression  Again unsuccessful.  Pads switched to Apex base position.  Again, 200 J synchronized biphasic energy applied.  Now successful at cardioversion to sinus bradycardia  Procedure without complication  EKG pending

## 2014-03-28 NOTE — H&P (View-Only) (Signed)
HPI  Patient is a 73 yo who is normally followed by Myles Gip  History of atrial flutter, atrial fib and sick sinus syndrome She was last seen in November. She called this AM saying that she felt like she was back in Afib  Notes HR increased Saturday night   Patinet took an extra dilt with no effect She says when she is in this She gets very tired.  SOB  Doe have some chest tightness/back tightness,  She has had this when in afib in the past  Denies dizziness  No SOB at rest   Allergies  Allergen Reactions  . Bupropion Hcl Other (See Comments)    Suicidal Thoughts.   . Escitalopram Oxalate Other (See Comments)    Suicidal Thoughts.   . Fluticasone-Salmeterol Other (See Comments)    Caused patient to go into Afib.   Marland Kitchen Lisinopril   . Serevent Other (See Comments)    Caused patient to go into Afib  . Amoxicillin Rash  . Macrodantin [Nitrofurantoin] Rash  . Penicillins Rash    Current Outpatient Prescriptions  Medication Sig Dispense Refill  . antiseptic oral rinse (BIOTENE) LIQD 15 mLs by Mouth Rinse route as needed (Dry Mouth).     . Biotin 1000 MCG tablet Take 1,000 mcg by mouth daily.    . budesonide (PULMICORT) 0.5 MG/2ML nebulizer solution Take 0.5 mg by nebulization daily.    Marland Kitchen CALCIUM-MAGNESIUM-ZINC PO Take 2 capsules by mouth daily.    . cromolyn (NASALCROM) 5.2 MG/ACT nasal spray Place 1 spray into both nostrils. Every 4-6 hours prn    . diltiazem (CARDIZEM) 120 MG tablet TAKE 1/2 TABLET BY MOUTH THREE TIMES DAILY 45 tablet 6  . diltiazem (CARDIZEM) 30 MG tablet Take 1 tablet (30 mg total) by mouth as needed. 30 tablet 2  . docusate-casanthranol (PERICOLACE) 100-30 MG per capsule Take 1 capsule by mouth daily as needed for constipation.     Marland Kitchen esomeprazole (NEXIUM 24HR) 20 MG capsule Take 20 mg by mouth daily at 12 noon.    . flecainide (TAMBOCOR) 100 MG tablet TAKE 1 TABLET BY MOUTH TWICE DAILY 60 tablet 4  . fluticasone (FLONASE) 50 MCG/ACT nasal spray Place 2 sprays into  the nose daily as needed for allergies. 16 g prn  . montelukast (SINGULAIR) 10 MG tablet Take 10 mg by mouth at bedtime.    Marland Kitchen omeprazole (PRILOSEC) 20 MG capsule Take 1 capsule by mouth daily.     . valsartan-hydrochlorothiazide (DIOVAN-HCT) 320-25 MG per tablet Take 1 tablet by mouth daily.    Marland Kitchen warfarin (COUMADIN) 5 MG tablet Take 5 mg by mouth daily. MANAGED BY DR. [facility]     No current facility-administered medications for this visit.    Past Medical History  Diagnosis Date  . Allergic rhinitis   . 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 Hyperlipidemia   . Essential hypertension, benign   . Anxiety   . Depression   . Lumbar disc disease   . OSA (obstructive sleep apnea)     uses CPAP  . Atrial flutter   . Obesity   . Respiratory failure     2 recent hospitalizations for hypoxic respiratory failure/ reactive airway disease  . COPD (chronic obstructive pulmonary disease)     Past Surgical History  Procedure Laterality Date  . Cataract extraction    . Lasik      Both eyes  . 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 Anterior fusion lumbar spine    . Colonoscopy N/A 08/04/2012    Procedure: COLONOSCOPY;  Surgeon: Rogene Houston, MD;  Location: [facility];  Service: Endoscopy;  Laterality: N/A;  730-rescheduled to 84 Ann notified pt    Family History  Problem Relation Age of Onset  . Cancer Mother   . Heart attack Father   . Colon cancer Other     History   Social History  . Marital Status: Single    Spouse Name: N/A    Number of Children: N/A  . Years of Education: N/A   Occupational History  . Retired: Presenter, broadcasting    Social History Main Topics  . Smoking status: Never Smoker   . Smokeless tobacco: Never Used  . Alcohol Use: No  . Drug Use: No  . Sexual Activity: Not on file   Other Topics Concern  . Not on file   Social History Narrative   Pt lives in [place] alone. She was never married.   Retired Customer service manager.    Attends [employer]    Review of Systems:  All systems reviewed.  They are negative to the above problem except as previously stated.  Vital Signs: BP 126/90 mmHg  Pulse 104  Ht 5' 2.5" (1.588 m)  Wt 202 lb (91.627 kg)  BMI 36.33 kg/m2  SpO2 97%  Physical Exam Patient is a morbidly obese 73 yo in NAD   HEENT:  Normocephalic, atraumatic. EOMI, PERRLA.  Neck: JVP is normal.  No bruits.  Lungs: clear to auscultation. No rales no wheezes.  Heart: Distant HS  Regular rate and rhythm. Normal S1, S2. No S3.   No significant murmurs. PMI not displaced.  Abdomen:  Supple, nontender. Normal bowel sounds. No masses. No hepatomegaly.  Extremities:   Good distal pulses throughout. Tr lower extremity edema.  Musculoskeletal :moving all extremities.  Neuro:   alert and oriented x3.  CN II-XII grossly intact.  EKG  Atrial flutter 104 bpm   Assessment and Plan:  1.  Atrial flutter  On flecanide  Reviewed with EP  Would recommm that the patient take an extra flecanide today  May convert. If doesn't will plan cardioversion with poss TEE if INR today not therapeutic.  Note INR at Dr Raul Del office was 2.9 on 02/02/14 and 2.8 on 03/13/14 Get other labs today.  2.  Atrial fib  Continue meds.    3.  HTN  Follow  4.  Chest tightness  Has had any time she has been in afib in past.  Fatiue  I would follow  I am not convinced represents angina.

## 2014-03-29 ENCOUNTER — Encounter (HOSPITAL_COMMUNITY): Payer: Self-pay | Admitting: Internal Medicine

## 2014-03-31 LAB — FLECAINIDE LEVEL: FLECAINIDE: 0.47 ug/mL (ref 0.20–1.00)

## 2014-04-02 ENCOUNTER — Telehealth: Payer: Self-pay | Admitting: Internal Medicine

## 2014-04-02 ENCOUNTER — Encounter: Payer: Self-pay | Admitting: Physician Assistant

## 2014-04-02 NOTE — Telephone Encounter (Signed)
Pt c/o sore throat but did not have TEE, will check with her pcp for possible step test

## 2014-04-02 NOTE — Telephone Encounter (Signed)
Patient would like return phone call regarding problems after Cardioversion / tgs

## 2014-04-05 ENCOUNTER — Encounter: Payer: Self-pay | Admitting: Adult Health

## 2014-04-05 ENCOUNTER — Ambulatory Visit (INDEPENDENT_AMBULATORY_CARE_PROVIDER_SITE_OTHER): Payer: Medicare Other | Admitting: Adult Health

## 2014-04-05 VITALS — BP 136/64 | HR 53 | Ht 62.5 in | Wt 206.2 lb

## 2014-04-05 DIAGNOSIS — I10 Essential (primary) hypertension: Secondary | ICD-10-CM

## 2014-04-05 MED ORDER — DILTIAZEM HCL 120 MG PO TABS: 60.0000 mg | ORAL_TABLET | Freq: Three times a day (TID) | ORAL | Status: DC

## 2014-04-05 MED ORDER — DILTIAZEM HCL 30 MG PO TABS: 30.0000 mg | ORAL_TABLET | ORAL | Status: DC | PRN

## 2014-04-05 MED ORDER — VALSARTAN-HYDROCHLOROTHIAZIDE 320-25 MG PO TABS: 1.0000 | ORAL_TABLET | Freq: Every day | ORAL | Status: DC

## 2014-04-05 MED ORDER — FLECAINIDE ACETATE 100 MG PO TABS: 100.0000 mg | ORAL_TABLET | Freq: Two times a day (BID) | ORAL | Status: DC

## 2014-04-05 NOTE — Assessment & Plan Note (Signed)
She has had one episode of atrial fib after DCCV. She states it was related to ill-fitting CPAP which is being replaced. Otherwise doing very well. She is medically compliant and is being followed by Dr.Shaw for INR checks. She sees him next week. I offered to have her come back in 6 months, but she wishes to see Dr.McDowell at a previously scheduled appt in a month.

## 2014-04-05 NOTE — Progress Notes (Deleted)
Name: [name] [name]    DOB: Aug 20, 1940  Age: 74 y.o.  MR#: [id number]       PCP:  Monico Blitz, MD      Insurance: Payor: MEDICARE / Plan: MEDICARE PART A AND B / Product Type: *No Product type* /   CC:    Chief Complaint  Patient presents with  . Atrial Flutter    VS Filed Vitals:   04/05/14 1326  BP: 136/64  Pulse: 53  Height: 5' 2.5" (1.588 m)  Weight: 206 lb 3.2 oz (93.532 kg)    Weights Current Weight  04/05/14 206 lb 3.2 oz (93.532 kg)  03/28/14 202 lb (91.627 kg)  03/26/14 202 lb (91.627 kg)    Blood Pressure  BP Readings from Last 3 Encounters:  04/05/14 136/64  03/28/14 111/63  03/26/14 126/90     Admit date:  (Not on file) Last encounter with RMR:  Visit date not found   Allergy Bupropion hcl; Escitalopram oxalate; Fluticasone-salmeterol; Lisinopril; Serevent; Amoxicillin; Macrodantin; and Penicillins  Current Outpatient Prescriptions  Medication Sig Dispense Refill  . antiseptic oral rinse (BIOTENE) LIQD 15 mLs by Mouth Rinse route as needed (Dry Mouth).     . Biotin 1000 MCG tablet Take 1,000 mcg by mouth daily.    . budesonide (PULMICORT) 0.5 MG/2ML nebulizer solution Take 0.5 mg by nebulization 2 (two) times daily.     Marland Kitchen CALCIUM-MAGNESIUM-ZINC PO Take 1 capsule by mouth 2 (two) times daily.     . cromolyn (NASALCROM) 5.2 MG/ACT nasal spray Place 1 spray into both nostrils every 4 (four) hours as needed for allergies. Every 4-6 hours prn    . diltiazem (CARDIZEM) 120 MG tablet TAKE 1/2 TABLET BY MOUTH THREE TIMES DAILY 45 tablet 6  . diltiazem (CARDIZEM) 30 MG tablet Take 1 tablet (30 mg total) by mouth as needed. (Patient taking differently: Take 30 mg by mouth as needed (palpitations). ) 30 tablet 2  . docusate-casanthranol (PERICOLACE) 100-30 MG per capsule Take 1 capsule by mouth daily as needed for constipation.     Marland Kitchen esomeprazole (NEXIUM 24HR) 20 MG capsule Take 20 mg by mouth daily at 12 noon.    . flecainide (TAMBOCOR) 100 MG tablet TAKE 1 TABLET  BY MOUTH TWICE DAILY 60 tablet 4  . fluticasone (FLONASE) 50 MCG/ACT nasal spray Place 2 sprays into the nose daily as needed for allergies. (Patient taking differently: Place 2 sprays into the nose daily. ) 16 g prn  . montelukast (SINGULAIR) 10 MG tablet Take 10 mg by mouth at bedtime.    Marland Kitchen omeprazole (PRILOSEC) 20 MG capsule Take 1 capsule by mouth daily.     . valsartan-hydrochlorothiazide (DIOVAN-HCT) 320-25 MG per tablet Take 1 tablet by mouth daily.    Marland Kitchen warfarin (COUMADIN) 5 MG tablet Take 2.5-5 mg by mouth daily. 2.5 mg on Mondays and 5 mg on all other days. MANAGED BY DR. [facility]     No current facility-administered medications for this visit.    Discontinued Meds:   There are no discontinued medications.  Patient Active Problem List   Diagnosis Date Noted  . Persistent atrial fibrillation   . Morbid obesity 06/21/2013  . Asthma, mild intermittent 10/18/2012  . Seasonal and perennial allergic rhinitis 10/18/2012  . Atrial flutter 07/14/2011  . Encounter for long-term (current) use of anticoagulants 07/03/2011  . 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 HYPERLIPIDEMIA-MIXED 11/22/2008  . Essential hypertension, benign 11/22/2008  . Obstructive sleep apnea 05/20/2007  . Paroxysmal atrial fibrillation 05/20/2007  LABS    Component Value Date/Time   NA 140 03/26/2014 1503   K 4.2 03/26/2014 1503   CL 102 03/26/2014 1503   CO2 27 03/26/2014 1503   GLUCOSE 96 03/26/2014 1503   BUN 19 03/26/2014 1503   CREATININE 1.06 03/26/2014 1503   CALCIUM 9.8 03/26/2014 1503   CMP     Component Value Date/Time   NA 140 03/26/2014 1503   K 4.2 03/26/2014 1503   CL 102 03/26/2014 1503   CO2 27 03/26/2014 1503   GLUCOSE 96 03/26/2014 1503   BUN 19 03/26/2014 1503   CREATININE 1.06 03/26/2014 1503   CALCIUM 9.8 03/26/2014 1503       Component Value Date/Time   WBC 9.0 03/26/2014 1503   HGB 13.8 03/26/2014 1503   HCT 42.5 03/26/2014 1503   MCV 82.7 03/26/2014 1503     Lipid Panel  No results found for: CHOL, TRIG, HDL, CHOLHDL, VLDL, LDLCALC, LDLDIRECT  ABG No results found for: PHART, PCO2ART, PO2ART, HCO3, TCO2, ACIDBASEDEF, O2SAT   No results found for: TSH BNP (last 3 results) No results for input(s): PROBNP in the last 8760 hours. Cardiac Panel (last 3 results) No results for input(s): CKTOTAL, CKMB, TROPONINI, RELINDX in the last 72 hours.  Iron/TIBC/Ferritin/ %Sat No results found for: IRON, TIBC, FERRITIN, IRONPCTSAT   EKG Orders placed or performed during the hospital encounter of 03/28/14  . EKG 12-Lead  . EKG 12-Lead pre-cardioversion  . EKG 12-Lead  . EKG 12-Lead pre-cardioversion  . EKG 12-Lead  . EKG 12-Lead  . EKG 12-Lead  . EKG 12-Lead  . EKG     Prior Assessment and Plan Problem List as of 04/05/2014    HYPERLIPIDEMIA-MIXED   Last Assessment & Plan   03/10/2012 Office Visit Written 03/10/2012  8:44 AM by Satira Sark, MD    Keep planned followup with Dr. Manuella Ghazi for physical in January.    Obstructive sleep apnea   Last Assessment & Plan   02/09/2014 Office Visit Written 02/09/2014  5:07 PM by Deneise Lever, MD    Good compliance and control on 10 cwp/ Advanced. No changes indicated    Essential hypertension, benign   Last Assessment & Plan   03/09/2014 Office Visit Written 03/09/2014  3:16 PM by Satira Sark, MD    Blood pressure control is good today.    Paroxysmal atrial fibrillation   Last Assessment & Plan   03/09/2014 Office Visit Written 03/09/2014  3:16 PM by Satira Sark, MD    Satisfactorily controlled at this time. No changes in current regimen. Follow-up in 4 months.    BRADYCARDIA-TACHYCARDIA SYNDROME   Last Assessment & Plan   02/09/2014 Office Visit Written 02/09/2014  5:06 PM by Deneise Lever, MD    Pulse slow on arrival- EKG done This is a little lower than her usual range.  Plan- if symptomatic as discussed , she will call her cardiologist for advice. Has pending appt in  december    Encounter for long-term (current) use of anticoagulants   Atrial flutter   Last Assessment & Plan   06/06/2013 Office Visit Written 06/06/2013 11:06 AM by Satira Sark, MD    Also atrial fibrillation by history. She has had a significant improvement in symptoms since February as outlined above. Heart rate is normal to mildly bradycardic and she is getting back to her exercise activities. Plan will be continue anticoagulation, Cardizem, and flecainide. She has seen Dr. Rayann Heman, and certainly if  she develops progressive problems with management of her atrial arrhythmias, further discussion can be had regarding a change in antiarrhythmic regimen and/or potentially ablation. I plan to see her back in the next 3 months.    Asthma, mild intermittent   Last Assessment & Plan   02/09/2014 Office Visit Written 02/09/2014  5:08 PM by Deneise Lever, MD    Controlled    Seasonal and perennial allergic rhinitis   Last Assessment & Plan   10/04/2012 Office Visit Written 10/18/2012  9:30 PM by Deneise Lever, MD    Plan-refill Flonase with discussion    Morbid obesity   Persistent atrial fibrillation       Imaging: No results found.

## 2014-04-05 NOTE — Patient Instructions (Signed)
Your physician recommends that you schedule a follow-up appointment with Dr. Domenic Polite as discussed   Your physician recommends that you continue on your current medications as directed. Please refer to the Current Medication list given to you today.  Thank you for choosing [facility]!

## 2014-04-05 NOTE — Progress Notes (Signed)
HPI: [name] [name] is a 74 year old patient of Dr. Domenic Polite, we follow for ongoing assessment and management of atrial flutter, atrial, and sick sinus syndrome.  She was last seen by Dr. Harrington Challenger on 03/26/2014 as an add-on as she was complaining of feeling her heart rate irregular and rapid. Also complaining of fatigue.  During office visit.  She was found to be in atrial flutter with a rate 104 beats per minute.  She was advised to take an extra dose of flecainide.  If the patient is not convert a possible TEE would be completed.  PT, INR was ordered.   On 12/30/2015DrHarrington Challenger performed a TEE cardioversion, 200 J synchronized biphasic energy applied, which was successful at cardioversion to sinus bradycardia. She is here for post procedure followup   She states two days after DCCV she had episode of atrial fib. She thinks it was because her CPAP mask was ill fitting and caused her to be dyspniec. She has a new CPAP machine and mask coming after being evaluated by [facility]. Should arrive this week. She has INR checked by Dr. Brigitte Pulse. Otherwise no cardiac complaints.   Allergies  Allergen Reactions  . Bupropion Hcl Other (See Comments)    Suicidal Thoughts.   . Escitalopram Oxalate Other (See Comments)    Suicidal Thoughts.   . Fluticasone-Salmeterol Other (See Comments)    Caused patient to go into Afib.   Marland Kitchen Lisinopril   . Serevent Other (See Comments)    Caused patient to go into Afib  . Amoxicillin Rash  . Macrodantin [Nitrofurantoin] Rash  . Penicillins Rash    Current Outpatient Prescriptions  Medication Sig Dispense Refill  . antiseptic oral rinse (BIOTENE) LIQD 15 mLs by Mouth Rinse route as needed (Dry Mouth).     . Biotin 1000 MCG tablet Take 1,000 mcg by mouth daily.    . budesonide (PULMICORT) 0.5 MG/2ML nebulizer solution Take 0.5 mg by nebulization 2 (two) times daily.     Marland Kitchen CALCIUM-MAGNESIUM-ZINC PO Take 1 capsule by mouth 2 (two) times daily.     . cromolyn  (NASALCROM) 5.2 MG/ACT nasal spray Place 1 spray into both nostrils every 4 (four) hours as needed for allergies. Every 4-6 hours prn    . diltiazem (CARDIZEM) 120 MG tablet TAKE 1/2 TABLET BY MOUTH THREE TIMES DAILY 45 tablet 6  . diltiazem (CARDIZEM) 30 MG tablet Take 1 tablet (30 mg total) by mouth as needed. (Patient taking differently: Take 30 mg by mouth as needed (palpitations). ) 30 tablet 2  . docusate-casanthranol (PERICOLACE) 100-30 MG per capsule Take 1 capsule by mouth daily as needed for constipation.     Marland Kitchen esomeprazole (NEXIUM 24HR) 20 MG capsule Take 20 mg by mouth daily at 12 noon.    . flecainide (TAMBOCOR) 100 MG tablet TAKE 1 TABLET BY MOUTH TWICE DAILY 60 tablet 4  . fluticasone (FLONASE) 50 MCG/ACT nasal spray Place 2 sprays into the nose daily as needed for allergies. (Patient taking differently: Place 2 sprays into the nose daily. ) 16 g prn  . montelukast (SINGULAIR) 10 MG tablet Take 10 mg by mouth at bedtime.    Marland Kitchen omeprazole (PRILOSEC) 20 MG capsule Take 1 capsule by mouth daily.     . valsartan-hydrochlorothiazide (DIOVAN-HCT) 320-25 MG per tablet Take 1 tablet by mouth daily.    Marland Kitchen warfarin (COUMADIN) 5 MG tablet Take 2.5-5 mg by mouth daily. 2.5 mg on Mondays and 5 mg on all other days.  MANAGED BY DR. [facility]     No current facility-administered medications for this visit.    Past Medical History  Diagnosis Date  . Allergic rhinitis   . 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 Hyperlipidemia   . Essential hypertension, benign   . Anxiety   . Depression   . Lumbar disc disease   . OSA (obstructive sleep apnea)     uses CPAP  . Atrial flutter   . Obesity   . Respiratory failure     2 recent hospitalizations for hypoxic respiratory failure/ reactive airway disease  . COPD (chronic obstructive pulmonary disease)     Past Surgical History  Procedure Laterality Date  . Cataract extraction    . Lasik      Both eyes  . Total hip  arthroplasty  2010    Right  . Anterior fusion lumbar spine    . Colonoscopy N/A 08/04/2012    Procedure: COLONOSCOPY;  Surgeon: Rogene Houston, MD;  Location: [facility];  Service: Endoscopy;  Laterality: N/A;  730-rescheduled to [place] notified pt  . Cardioversion N/A 03/28/2014    Procedure: CARDIOVERSION;  Surgeon: Fay Records, MD;  Location: [facility] ORS;  Service: Cardiovascular;  Laterality: N/A;    AES:LPNPYYFR review of systems performed and found to be negative unless outlined above  PHYSICAL EXAM BP 136/64 mmHg  Pulse 53  Ht 5' 2.5" (1.588 m)  Wt 206 lb 3.2 oz (93.532 kg)  BMI 37.09 kg/m2  General: Well developed, well nourished, in no acute distress Head: Eyes PERRLA, No xanthomas.   Normal cephalic and atramatic  Lungs: Clear bilaterally to auscultation and percussion. Heart: HRRR S1 S2, without MRG.  Pulses are 2+ & equal.            No carotid bruit. No JVD.  No abdominal bruits. No femoral bruits. Abdomen: Bowel sounds are positive, abdomen soft and non-tender without masses or                  Hernia's noted. Msk:  Back normal, normal gait. Normal strength and tone for age. Extremities: No clubbing, cyanosis or edema. Some soreness in right knee from recently pulled tendons. Limping when she walks. DP +1 Neuro: Alert and oriented X 3. Psych:  Good affect, responds appropriately  EKG: NSR rate of 50 bpm.   ASSESSMENT AND PLAN

## 2014-04-05 NOTE — Assessment & Plan Note (Signed)
Currently well controlled. No changes in medication regimen.

## 2014-04-18 ENCOUNTER — Telehealth: Payer: Self-pay | Admitting: Cardiology

## 2014-04-18 NOTE — Telephone Encounter (Signed)
[name] [name] is calling today stating that she is in atrial fib. States that this has been going on since [facility] Tuesday.

## 2014-04-18 NOTE — Telephone Encounter (Signed)
When she is not driving, and perhaps back at home where she can rest, would encourage her to use her short acting diltiazem. This has been effective in the past with other PAF events.

## 2014-04-18 NOTE — Telephone Encounter (Signed)
Spoke with patient while she was in route to [facility] and patient said that she felt that her heart was out of rhythm. Patient said she hasn't taken any of the 30 mg diltiazem because the last time she took it, her blood pressure dropped too low. Patient said her blood pressure today was 130/80 and she felt that it was too low to take the 30 mg diltiazem. Patient said she does have some sob but she thinks its related to the cold she has. Patient said she wouldn't be back home today until 3:30 pm or 4:00 pm.

## 2014-04-18 NOTE — Telephone Encounter (Signed)
Patient informed and verbalized understanding of plan. 

## 2014-04-25 ENCOUNTER — Ambulatory Visit (INDEPENDENT_AMBULATORY_CARE_PROVIDER_SITE_OTHER): Payer: Medicare Other | Admitting: Cardiology

## 2014-04-25 ENCOUNTER — Encounter: Payer: Self-pay | Admitting: Cardiology

## 2014-04-25 VITALS — BP 130/60 | HR 52 | Ht 62.0 in | Wt 201.0 lb

## 2014-04-25 DIAGNOSIS — G4733 Obstructive sleep apnea (adult) (pediatric): Secondary | ICD-10-CM

## 2014-04-25 DIAGNOSIS — I480 Paroxysmal atrial fibrillation: Secondary | ICD-10-CM

## 2014-04-25 DIAGNOSIS — I10 Essential (primary) hypertension: Secondary | ICD-10-CM

## 2014-04-25 NOTE — Assessment & Plan Note (Signed)
No change in baseline antihypertensives.

## 2014-04-25 NOTE — Assessment & Plan Note (Signed)
Maintaining sinus rhythm for now. Continue Cardizem, flecainide, and Coumadin. Status post recent TEE direct cardioversion.

## 2014-04-25 NOTE — Assessment & Plan Note (Signed)
Continues on CPAP 

## 2014-04-25 NOTE — Progress Notes (Signed)
Reason for visit: Atrial fibrillation  Clinical Summary [name] [name] is a 74 y.o.female just seen in the office by Ms. Lawrence NP in early January 2016. She is status post TEE guided cardioversion of persistent atrial fibrillation by Dr. Harrington Challenger in late December 2015 having failed typical medication adjustments. Since that time she reports only one brief episode of breakthrough atrial fibrillation, may of been related to a change in her CPAP mask which did not fit as well. Today she is in sinus rhythm, bradycardic as before. I reviewed her home blood pressure and heart rate checks.  Recent lab work showed hemoglobin 13.8, platelets 261, potassium 4.2, BUN 19, creatinine 1.1  ECG on January 8 showed sinus bradycardia at 50 bpm, decreased R wave progression.  Allergies  Allergen Reactions  . Bupropion Hcl Other (See Comments)    Suicidal Thoughts.   . Escitalopram Oxalate Other (See Comments)    Suicidal Thoughts.   . Fluticasone-Salmeterol Other (See Comments)    Caused patient to go into Afib.   Marland Kitchen Lisinopril   . Serevent Other (See Comments)    Caused patient to go into Afib  . Amoxicillin Rash  . Macrodantin [Nitrofurantoin] Rash  . Penicillins Rash    Current Outpatient Prescriptions  Medication Sig Dispense Refill  . antiseptic oral rinse (BIOTENE) LIQD 15 mLs by Mouth Rinse route as needed (Dry Mouth).     . Biotin 1000 MCG tablet Take 1,000 mcg by mouth daily.    . budesonide (PULMICORT) 0.5 MG/2ML nebulizer solution Take 0.5 mg by nebulization 2 (two) times daily.     Marland Kitchen CALCIUM-MAGNESIUM-ZINC PO Take 1 capsule by mouth 2 (two) times daily.     . cromolyn (NASALCROM) 5.2 MG/ACT nasal spray Place 1 spray into both nostrils every 4 (four) hours as needed for allergies. Every 4-6 hours prn    . diltiazem (CARDIZEM) 120 MG tablet Take 0.5 tablets (60 mg total) by mouth 3 (three) times daily. 45 tablet 6  . diltiazem (CARDIZEM) 30 MG tablet Take 1 tablet (30 mg total) by mouth as  needed (palpitations). 30 tablet 2  . docusate-casanthranol (PERICOLACE) 100-30 MG per capsule Take 1 capsule by mouth daily as needed for constipation.     Marland Kitchen esomeprazole (NEXIUM 24HR) 20 MG capsule Take 20 mg by mouth daily at 12 noon.    . flecainide (TAMBOCOR) 100 MG tablet Take 1 tablet (100 mg total) by mouth 2 (two) times daily. 60 tablet 4  . fluticasone (FLONASE) 50 MCG/ACT nasal spray Place 2 sprays into the nose daily as needed for allergies. (Patient taking differently: Place 2 sprays into the nose daily. ) 16 g prn  . montelukast (SINGULAIR) 10 MG tablet Take 10 mg by mouth at bedtime.    Marland Kitchen omeprazole (PRILOSEC) 20 MG capsule Take 1 capsule by mouth daily.     . valsartan-hydrochlorothiazide (DIOVAN-HCT) 320-25 MG per tablet Take 1 tablet by mouth daily. 30 tablet 6  . warfarin (COUMADIN) 5 MG tablet Take 2.5-5 mg by mouth daily. 2.5 mg on Mondays and 5 mg on all other days. MANAGED BY DR. [facility]     No current facility-administered medications for this visit.    Past Medical History  Diagnosis Date  . Allergic rhinitis   . 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 Hyperlipidemia   . Essential hypertension, benign   . Anxiety   . Depression   . Lumbar disc disease   . OSA (obstructive  sleep apnea)     uses CPAP  . Atrial flutter   . Obesity   . Respiratory failure     2 recent hospitalizations for hypoxic respiratory failure/ reactive airway disease  . COPD (chronic obstructive pulmonary disease)     Social History [name] [name] reports that she has never smoked. She has never used smokeless tobacco. [name] [name] reports that she does not drink alcohol.  Review of Systems Complete review of systems negative except as otherwise outlined in the clinical summary and also the following. No chest pain. Has had right leg discomfort after an injury 3 months ago, just now getting back to going to the pool.  Physical Examination Filed Vitals:    04/25/14 1110  BP: 130/60  Pulse: 52    Wt Readings from Last 3 Encounters:  04/25/14 201 lb (91.173 kg)  04/05/14 206 lb 3.2 oz (93.532 kg)  03/28/14 202 lb (91.627 kg)   Overweight woman in no acute distress.  HEENT: Conjunctiva and lids normal, oropharynx with moist mucosa.  Neck: Supple, no elevated JVP or bruits.  Lungs: Clear to auscultation, diminished, nonlabored.  Cardiac: Regular rate and rhythm, distant heart sounds, no significant systolic murmur.  Abdomen: Soft, nontender, bowel sounds present.  Extremities: No pitting edema.    Problem List and Plan   Paroxysmal atrial fibrillation Maintaining sinus rhythm for now. Continue Cardizem, flecainide, and Coumadin. Status post recent TEE direct cardioversion.   Obstructive sleep apnea Continues on CPAP.   Essential hypertension, benign No change in baseline antihypertensives.     Satira Sark, M.D., F.A.C.C.

## 2014-04-25 NOTE — Patient Instructions (Signed)
Your physician recommends that you schedule a follow-up appointment in: 3 months. Your physician recommends that you continue on your current medications as directed. Please refer to the Current Medication list given to you today. 

## 2014-06-07 ENCOUNTER — Telehealth: Payer: Self-pay | Admitting: Cardiology

## 2014-06-07 NOTE — Telephone Encounter (Signed)
Patient called stating that she saw Dr. Manuella Ghazi yesterday and he suggested to her that he wants to enroll her in a Lipitor research program. Patient is very concerned about starting a new medication without speaking to Dr. Domenic Polite.

## 2014-06-07 NOTE — Telephone Encounter (Signed)
Please let her know that I do not see a contraindication to her taking Lipitor based on her current cardiac status. It should not be an issue in terms of her heart rhythm management. Remind her to ask Dr. Manuella Ghazi to explain the study to her in detail including any anticipated risks and benefits. With that information in mind, she can make a decision as to whether it seems like the right thing for her to do or not.

## 2014-06-07 NOTE — Telephone Encounter (Signed)
Please advise if you think its safe for patient to participate in a lipitor research program.

## 2014-06-08 NOTE — Telephone Encounter (Signed)
Patient informed. 

## 2014-07-11 ENCOUNTER — Telehealth: Payer: Self-pay | Admitting: Cardiology

## 2014-07-11 NOTE — Telephone Encounter (Signed)
Patient called stating that she starting feel strange on Monday night.  HR 110 -Monday night 8pm.

## 2014-07-11 NOTE — Telephone Encounter (Signed)
Patient c/o her heart racing and her home monitor showing that she is in atrial fibrillation. Patient said she has been taken the diltiazem 30 mg every 6 hours since Monday @ 8:00 pm and also took and extra flecainide today around 12:30 pm. Patient said her HR was 110 but now is 99 & BP 110/69 . Patient did say that her PCP Manuella Ghazi) placed her on lipitor 10 mg but no other changes to her medications. Patient said her diet hasn't changed. Patient denies chest pain, dizziness or sob. Patient's 3 month appointment was moved to next week on 07/17/14 instead of later in the month. Patient informed that MD would be informed.

## 2014-07-11 NOTE — Telephone Encounter (Signed)
Patient states that she has been taking Cardizem 30 mg every 6 hrs and took a Flecainide 100mg  today @ 12:30pm  States that she is having some shortness of breath. HR 93 @ 245pm today with BP 113/77.  Also, needs to have refill on Flecainide. Please call patient on cell #

## 2014-07-12 NOTE — Telephone Encounter (Signed)
Noted. This has been her typical course of atrial fibrillation. Usually this resolves with the medication adjustments she is making. Continue to keep an eye on things.

## 2014-07-12 NOTE — Telephone Encounter (Signed)
Patient informed and verbalized understanding of plan. 

## 2014-07-17 ENCOUNTER — Encounter: Payer: Self-pay | Admitting: Cardiology

## 2014-07-17 ENCOUNTER — Ambulatory Visit (INDEPENDENT_AMBULATORY_CARE_PROVIDER_SITE_OTHER): Payer: Medicare Other | Admitting: Cardiology

## 2014-07-17 VITALS — BP 122/70 | HR 91 | Ht 62.0 in | Wt 205.0 lb

## 2014-07-17 DIAGNOSIS — G4733 Obstructive sleep apnea (adult) (pediatric): Secondary | ICD-10-CM | POA: Diagnosis not present

## 2014-07-17 DIAGNOSIS — I10 Essential (primary) hypertension: Secondary | ICD-10-CM

## 2014-07-17 DIAGNOSIS — I495 Sick sinus syndrome: Secondary | ICD-10-CM | POA: Diagnosis not present

## 2014-07-17 DIAGNOSIS — I480 Paroxysmal atrial fibrillation: Secondary | ICD-10-CM | POA: Diagnosis not present

## 2014-07-17 NOTE — Patient Instructions (Signed)
Your physician recommends that you continue on your current medications as directed. Please refer to the Current Medication list given to you today. You have been referred to Dr. Rayann Heman. Your physician recommends that you schedule a follow-up appointment in: 3 months. You will receive a reminder letter in the mail in about 1-2 months reminding you to call and schedule your appointment. If you don't receive this letter, please contact our office.

## 2014-07-17 NOTE — Progress Notes (Signed)
Cardiology Office Note  Date: 07/17/2014   ID: [name] [name], DOB 10/18/1940, MRN [id number]  PCP: Monico Blitz, MD  Primary Cardiologist: Rozann Lesches, MD   Chief Complaint  Patient presents with  . Atrial Fibrillation    History of Present Illness: [name] [name] is a 74 y.o. female last seen in January. Recent telephone notes reviewed. He has had a recent bout of atrial fibrillation that although being paroxysmal, she has had more prolonged episodes at a time. She has used her low dose as needed Cardizem, although has not been able to be consistent due to low heart rates when she goes back into sinus rhythm, also documented in the past. She is symptomatic with fatigue and shortness of breath when she is in atrial fibrillation. She continues on flecainide (levels have been therapeutic when checked) and also Coumadin. She did undergo a TEE guided cardioversion in December 2015 with persistent atrial fibrillation, and had done fairly well since then up until the last few weeks.  Today we discussed options for continued management. She does not require a repeat cardioversion now since she is coming in and out of atrial fibrillation. I doubt that advancing her flecainide further will provide much better control, and will probably just put her at risk for having side effects or supratherapeutic levels. She has COPD and also sleep apnea, follows with Dr. Annamaria Boots in [place]. She also has tachycardia-bradycardia syndrome which complicates the management of her arrhythmia. I have had her see Dr. Rayann Heman in the past.   Past Medical History  Diagnosis Date  . Allergic rhinitis   . 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 Hyperlipidemia   . Essential hypertension, benign   . Anxiety   . Depression   . Lumbar disc disease   . OSA (obstructive sleep apnea)     uses CPAP  . Atrial flutter   . Obesity   . Respiratory failure     2 recent  hospitalizations for hypoxic respiratory failure/ reactive airway disease  . COPD (chronic obstructive pulmonary disease)     Past Surgical History  Procedure Laterality Date  . Cataract extraction    . Lasik      Both eyes  . Total hip arthroplasty  2010    Right  . Anterior fusion lumbar spine    . Colonoscopy N/A 08/04/2012    Procedure: COLONOSCOPY;  Surgeon: Rogene Houston, MD;  [facility]: [facility];  Service: Endoscopy;  Laterality: N/A;  730-rescheduled to [place] notified pt  . Cardioversion N/A 03/28/2014    Procedure: CARDIOVERSION;  Surgeon: Fay Records, MD;  Location: [facility]S;  Service: Cardiovascular;  Laterality: N/A;    Current Outpatient Prescriptions  Medication Sig Dispense Refill  . antiseptic oral rinse (BIOTENE) LIQD 15 mLs by Mouth Rinse route as needed (Dry Mouth).     Marland Kitchen atorvastatin (LIPITOR) 10 MG tablet     . Biotin 1000 MCG tablet Take 1,000 mcg by mouth daily.    . budesonide (PULMICORT) 0.5 MG/2ML nebulizer solution Take 0.5 mg by nebulization 2 (two) times daily.     Marland Kitchen CALCIUM-MAGNESIUM-ZINC PO Take 1 capsule by mouth 2 (two) times daily.     . cromolyn (NASALCROM) 5.2 MG/ACT nasal spray Place 1 spray into both nostrils every 4 (four) hours as needed for allergies. Every 4-6 hours prn    . diltiazem (CARDIZEM) 120 MG tablet Take 0.5 tablets (60 mg total) by mouth 3 (  three) times daily. 45 tablet 6  . diltiazem (CARDIZEM) 30 MG tablet Take 1 tablet (30 mg total) by mouth as needed (palpitations). 30 tablet 2  . docusate-casanthranol (PERICOLACE) 100-30 MG per capsule Take 1 capsule by mouth daily as needed for constipation.     Marland Kitchen esomeprazole (NEXIUM 24HR) 20 MG capsule Take 20 mg by mouth daily at 12 noon.    . flecainide (TAMBOCOR) 100 MG tablet Take 1 tablet (100 mg total) by mouth 2 (two) times daily. 60 tablet 4  . fluticasone (FLONASE) 50 MCG/ACT nasal spray Place 2 sprays into the nose daily as needed for allergies. (Patient taking differently:  Place 2 sprays into the nose daily. ) 16 g prn  . montelukast (SINGULAIR) 10 MG tablet Take 10 mg by mouth at bedtime.    Marland Kitchen omeprazole (PRILOSEC) 20 MG capsule Take 1 capsule by mouth daily.     . valsartan-hydrochlorothiazide (DIOVAN-HCT) 320-25 MG per tablet Take 1 tablet by mouth daily. 30 tablet 6  . warfarin (COUMADIN) 5 MG tablet Take 2.5-5 mg by mouth daily. 2.5 mg on Mondays and 5 mg on all other days. MANAGED BY DR. [facility]     No current facility-administered medications for this visit.    Allergies:  Bupropion hcl; Escitalopram oxalate; Fluticasone-salmeterol; Lexapro; Lisinopril; Serevent; Wellbutrin; Amoxicillin; Macrodantin; and Penicillins   Social History: The patient  reports that she has never smoked. She has never used smokeless tobacco. She reports that she does not drink alcohol or use illicit drugs.   ROS:  Please see the history of present illness. Otherwise, complete review of systems is positive for shortness of breath and fatigue, no chest pain.  All other systems are reviewed and negative.   Physical Exam: VS:  BP 122/70 mmHg  Pulse 91  Ht 5\' 2"  (1.575 m)  Wt 205 lb (92.987 kg)  BMI 37.49 kg/m2  SpO2 97%, BMI Body mass index is 37.49 kg/(m^2).  Wt Readings from Last 3 Encounters:  07/17/14 205 lb (92.987 kg)  04/25/14 201 lb (91.173 kg)  04/05/14 206 lb 3.2 oz (93.532 kg)     Overweight woman in no acute distress.  HEENT: Conjunctiva and lids normal, oropharynx with moist mucosa.  Neck: Supple, no elevated JVP or bruits.  Lungs: Clear to auscultation, diminished, nonlabored.  Cardiac: Irregularly irregular, distant heart sounds, no significant systolic murmur.  Abdomen: Soft, nontender, bowel sounds present.  Extremities: No pitting edema.  Skin: Warm and dry. Musculoskeletal: No kyphosis. Neuropsychiatric: Alert and oriented 3, affect appropriate.   ECG: ECG is not ordered today.   Recent Labwork: 03/26/2014: BUN 19; Creatinine 1.06;  Hemoglobin 13.8; Platelets 261; Potassium 4.2; Sodium 140   Assessment and Plan:  1. Symptomatic paroxysmal atrial fibrillation. She has been on flecainide, Coumadin, and uses Cardizem as well, although this is limited by significant bradycardia when she is in sinus rhythm. She has COPD and obstructive sleep apnea, both of which probably also contribute to recurring atrial fibrillation. She reports compliance with her medications, and does follow her heart rate and blood pressure closely at home. I am going to send her back to see Dr. Rayann Heman to see if we need to change course in her treatment strategy. She has been evaluated for ablation previously and held off at the time. Maybe considering a different antiarrhythmic would be an intermediate step. I also asked her to talk with Dr. Annamaria Boots to see whether she needed a sleep titration study and make sure that her CPAP has  been adequate.  2. Obstructive sleep apnea on CPAP, followed by Dr. Annamaria Boots.  3. 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  4. Essential hypertension, blood pressure has been fairly well controlled.  Current medicines were reviewed with the patient today.   Orders Placed This Encounter  Procedures  . Ambulatory referral to Cardiac Electrophysiology    Disposition: FU with me in 3 months.   Signed, Satira Sark, MD, [facility] 07/17/2014 11:53 AM    [facility] [place], [place], [place] [address] Phone: [phone]; Fax: [phone]

## 2014-07-26 ENCOUNTER — Ambulatory Visit: Payer: Medicare Other | Admitting: Cardiology

## 2014-07-27 ENCOUNTER — Ambulatory Visit (INDEPENDENT_AMBULATORY_CARE_PROVIDER_SITE_OTHER): Payer: Medicare Other | Admitting: Internal Medicine

## 2014-07-27 ENCOUNTER — Encounter: Payer: Self-pay | Admitting: Internal Medicine

## 2014-07-27 VITALS — BP 120/70 | HR 97 | Ht 62.5 in | Wt 206.0 lb

## 2014-07-27 DIAGNOSIS — G4733 Obstructive sleep apnea (adult) (pediatric): Secondary | ICD-10-CM | POA: Diagnosis not present

## 2014-07-27 DIAGNOSIS — I480 Paroxysmal atrial fibrillation: Secondary | ICD-10-CM | POA: Diagnosis not present

## 2014-07-27 DIAGNOSIS — I4892 Unspecified atrial flutter: Secondary | ICD-10-CM | POA: Diagnosis not present

## 2014-07-27 NOTE — Progress Notes (Signed)
Electrophysiology Office Note   Date:  07/27/2014   ID:  SYNDI PUA, DOB [birth year], MRN [id number]  PCP:  Monico Blitz, MD  Cardiologist:  Dr Domenic Polite Primary Electrophysiologist: Thompson Grayer, MD    Chief Complaint  Patient presents with  . Atrial Fibrillation     History of Present Illness: [name] [name] is a 74 y.o. female who presents today for electrophysiology evaluation.   The patient has been seen by me 3/15 for afib (my note is reviewed). She has a h/o afib and atrial flutter.  She has obesity and sleep apnea for which she is compliant with CPAP.  She is appropriately anticoagulated with coumadin (chads2vasc score is at least 3).  She did well initially with flecainide.  Unfortunately, she has had increasing frequency and duration of atrial fibrillation despite this medicine.  Additional medical therapy has been limited by bradycardia. She reports that she now has afib most days (since 07/09/14).  She was cardioverted in December.  She feels that her afib continues to "come and go".  She has fatigue and SOB with her afib.  She remains active.  Today, she denies symptoms of palpitations, chest pain, orthopnea, PND, lower extremity edema, claudication, dizziness, presyncope, syncope, bleeding, or neurologic sequela. The patient is tolerating medications without difficulties and is otherwise without complaint today.    Past Medical History  Diagnosis Date  . Allergic rhinitis   . Paroxysmal atrial fibrillation     Element of tachycardia bradycardia syndrome  . Hyperlipidemia   . Essential hypertension, benign   . Anxiety   . Depression   . Lumbar disc disease   . OSA (obstructive sleep apnea)     uses CPAP  . Atrial flutter   . Obesity   . Respiratory failure     2 recent hospitalizations for hypoxic respiratory failure/ reactive airway disease  . COPD (chronic obstructive pulmonary disease)    Past Surgical History  Procedure Laterality Date  . Cataract  extraction    . Lasik      Both eyes  . Total hip arthroplasty  2010    Right  . Anterior fusion lumbar spine    . Colonoscopy N/A 08/04/2012    Procedure: COLONOSCOPY;  Surgeon: Rogene Houston, MD;  [facility];  Service: Endoscopy;  Laterality: N/A;  730-rescheduled to [place] notified pt  . Cardioversion N/A 03/28/2014    Procedure: CARDIOVERSION;  Surgeon: Fay Records, MD;  Location: [facility] ORS;  Service: Cardiovascular;  Laterality: N/A;     Current Outpatient Prescriptions  Medication Sig Dispense Refill  . antiseptic oral rinse (BIOTENE) LIQD 15 mLs by Mouth Rinse route as needed (Dry Mouth).     Marland Kitchen atorvastatin (LIPITOR) 10 MG tablet Take 10 mg by mouth daily.     . Biotin 1000 MCG tablet Take 5,000 mcg by mouth daily.     . budesonide (PULMICORT) 0.5 MG/2ML nebulizer solution Take 0.5 mg by nebulization 2 (two) times daily.     Marland Kitchen CALCIUM-MAGNESIUM-ZINC PO Take 1 capsule by mouth 2 (two) times daily.     . cromolyn (NASALCROM) 5.2 MG/ACT nasal spray Place 1 spray into both nostrils every 4 (four) hours as needed for allergies. Every 4-6 hours prn    . diltiazem (CARDIZEM) 120 MG tablet Take 0.5 tablets (60 mg total) by mouth 3 (three) times daily. 45 tablet 6  . diltiazem (CARDIZEM) 30 MG tablet Take 1 tablet (30 mg total) by mouth as needed (palpitations). [place]  tablet 2  . docusate-casanthranol (PERICOLACE) 100-30 MG per capsule Take 1 capsule by mouth daily as needed for constipation.     . flecainide (TAMBOCOR) 100 MG tablet Take 1 tablet (100 mg total) by mouth 2 (two) times daily. 60 tablet 4  . fluticasone (FLONASE) 50 MCG/ACT nasal spray Place 2 sprays into the nose daily as needed for allergies. (Patient taking differently: Place 2 sprays into the nose daily. ) 16 g prn  . montelukast (SINGULAIR) 10 MG tablet Take 10 mg by mouth at bedtime.    Marland Kitchen omeprazole (PRILOSEC) 20 MG capsule Take 1 capsule by mouth daily.     . valsartan-hydrochlorothiazide (DIOVAN-HCT) 320-25  MG per tablet Take 1 tablet by mouth daily. 30 tablet 6  . warfarin (COUMADIN) 5 MG tablet Take 2.5-5 mg by mouth daily. 2.5 mg on Mondays and 5 mg on all other days. MANAGED BY DR. [facility]     No current facility-administered medications for this visit.    Allergies:   Bupropion hcl; Escitalopram oxalate; Fluticasone-salmeterol; Lexapro; Lisinopril; Serevent; Wellbutrin; Amoxicillin; Macrodantin; and Penicillins   Social History:  The patient  reports that she has never smoked. She has never used smokeless tobacco. She reports that she does not drink alcohol or use illicit drugs.   Family History:  The patient's  family history includes Cancer in her mother; Colon cancer in her other; Heart attack in her father.    ROS:  Please see the history of present illness.   All other systems are reviewed and negative.    PHYSICAL EXAM: VS:  BP 120/70 mmHg  Pulse 97  Ht 5' 2.5" (1.588 m)  Wt 206 lb (93.441 kg)  BMI 37.05 kg/m2  SpO2 98% , BMI Body mass index is 37.05 kg/(m^2). GEN: Well nourished, well developed, overweight, in no acute distress HEENT: normal Neck: no JVD, carotid bruits, or masses Cardiac: iRRR; no murmurs, rubs, or gallops,no edema  Respiratory:  clear to auscultation bilaterally, normal work of breathing GI: soft, nontender, nondistended, + BS MS: significant lower extremity atrophy Skin: warm and dry  Neuro:  Strength and sensation are intact Psych: euthymic mood, full affect  EKG:  EKG is ordered today. The ekg ordered today shows atrial flutter vs afib   Recent Labs: 03/26/2014: BUN 19; Creatinine 1.06; Hemoglobin 13.8; Platelets 261; Potassium 4.2; Sodium 140    Lipid Panel  No results found for: CHOL, TRIG, HDL, CHOLHDL, VLDL, LDLCALC, LDLDIRECT   Wt Readings from Last 3 Encounters:  07/27/14 206 lb (93.441 kg)  07/17/14 205 lb (92.987 kg)  04/25/14 201 lb (91.173 kg)      Other studies Reviewed: Additional studies/ records that were reviewed today  include: echo 1/15  Review of the above records today demonstrates: preserved EF, mild atrial dilatation, no significant valvular disease Dr Chuck Hint notes are also reviewed   ASSESSMENT AND PLAN:  1.  Paroxysmal atrial fibrillation and atrial flutter The patient has symptomatic atrial arrhythmias despite medical therapy with flecainide.  She has obesity and OSA.  She has not had any real results with lifestyle modification but is compliant with CPAP. Therapeutic strategies for afib/ atrial flutter including medicine and ablation were discussed in detail with the patient today.  I think that her only real AAD option would be tikosyn.  Risk, benefits, and alternatives to EP study and radiofrequency ablation were also discussed in detail today. These risks include but are not limited to stroke, bleeding, vascular damage, tamponade, perforation, damage to the esophagus,  lungs, and other structures, pulmonary vein stenosis, worsening renal function, and death. The patient understands these risk and wishes to proceed.  We will therefore proceed with catheter ablation at the next available time.  2. Obesity I have reviewed the patients BMI and decreased success rates with ablation at length today.  Weight loss is strongly advised. Cardiofit and ARREST AF results were discussed today.  I will refer her to our [facility]'s nutritional meeting in May. Per Guijian et al (PACE 2013; 36: 383-291), patients with BMI 25-29.9 (obese) have a 27% increase in AF recurrence post ablation.  Patients with BMI >30 have a 31% increase in AF recurrence post ablation when compared to those with BMI <25.  3. OSA Compliance with CPAP is encouraged  4. HTN Stable No change required today  Current medicines are reviewed at length with the patient today.   The patient does not have concerns regarding her medicines.  The following changes were made today:  none  Labs/ tests ordered today include:  Orders Placed This  Encounter  Procedures  . EKG 12-Lead   Signed, Thompson Grayer, MD  07/27/2014 9:37 AM     [facility] [address] [place] [address] [phone] (office) [phone] (fax)

## 2014-07-27 NOTE — Patient Instructions (Signed)
Your physician recommends that you continue on your current medications as directed. Please refer to the Current Medication list given to you today. Your physician has recommended that you have an ablation. Catheter ablation is a medical procedure used to treat some cardiac arrhythmias (irregular heartbeats). During catheter ablation, a long, thin, flexible tube is put into a blood vessel in your groin (upper thigh), or neck. This tube is called an ablation catheter. It is then guided to your heart through the blood vessel. Radio frequency waves destroy small areas of heart tissue where abnormal heartbeats may cause an arrhythmia to start. Please see the instruction sheet given to you today. You will be contacted by Janan Halter ( doctor Allred's nurse) to schedule and set up the ablation.

## 2014-08-13 ENCOUNTER — Telehealth: Payer: Self-pay | Admitting: Cardiology

## 2014-08-13 NOTE — Telephone Encounter (Signed)
Patient wants to know if she can take Dramamine. She has episodes of nausea when she is riding in the backseat of a car.

## 2014-08-13 NOTE — Telephone Encounter (Signed)
Pt just wanted to know about taking dramamine weekly when she rides with friend for dr. Kendrick Fries. Has taken before with no side affects but wanted Dr. Domenic Polite made aware. Will forward as FYI

## 2014-08-13 NOTE — Telephone Encounter (Signed)
Should be okay for her to do this.

## 2014-08-14 ENCOUNTER — Ambulatory Visit: Payer: Medicare Other | Admitting: Internal Medicine

## 2014-08-28 ENCOUNTER — Telehealth: Payer: Self-pay | Admitting: Internal Medicine

## 2014-08-28 NOTE — Telephone Encounter (Signed)
[name] [name] is calling the [place] office in regards to labs that need to be drawn before her procedure on 09/18/14.  Please advise. [name] [name] is wanting to know if the labs can be drawn in [place].

## 2014-08-28 NOTE — Telephone Encounter (Signed)
Ms. Friebel called stating that someone was suppose to be calling Dr. Manuella Ghazi (PCP) in regards to upcoming procedure with Dr. Rayann Heman.

## 2014-08-28 NOTE — Telephone Encounter (Signed)
Will fax order for to [employer] [facility] for labs on 6/13.  She is having an INR tomorrow and 6/6 at Dr Trena Platt office.  Tried to call patient back and got no answer

## 2014-08-30 ENCOUNTER — Encounter: Payer: Self-pay | Admitting: *Deleted

## 2014-09-03 ENCOUNTER — Telehealth: Payer: Self-pay | Admitting: Internal Medicine

## 2014-09-03 NOTE — Telephone Encounter (Signed)
Discussed wit Dr Rayann Heman as her INR hs been 1.8 for the last 2 weeks.  He recommends she stop Coumadin and start Xarelto 20 mg daily and proceed with the ablation on 09/18/14.  She really wants to get Dr Trena Platt opinion as he has been managing her Coumadin for years.  She will call me back on Wed with her decision.  I she stays on Coumadin will need to reschedule her ablation and have 4 consecutive weeks on INR's > 2.0.  She verbalized understanding and will let me know

## 2014-09-03 NOTE — Telephone Encounter (Signed)
New Message      Patient has questions about her upcoming surgery; pt has a C-PAP and she wants to know if she should bring her device to the hospital. Also she is confused on where she should go after she enters the [employer]	[place] at [facility].

## 2014-09-04 ENCOUNTER — Encounter: Payer: Self-pay | Admitting: Internal Medicine

## 2014-09-04 ENCOUNTER — Ambulatory Visit (INDEPENDENT_AMBULATORY_CARE_PROVIDER_SITE_OTHER): Payer: Medicare Other | Admitting: Internal Medicine

## 2014-09-04 ENCOUNTER — Encounter (INDEPENDENT_AMBULATORY_CARE_PROVIDER_SITE_OTHER): Payer: Self-pay

## 2014-09-04 VITALS — BP 130/74 | HR 43 | Ht 64.0 in | Wt 202.0 lb

## 2014-09-04 DIAGNOSIS — J309 Allergic rhinitis, unspecified: Secondary | ICD-10-CM | POA: Diagnosis not present

## 2014-09-04 DIAGNOSIS — J3089 Other allergic rhinitis: Secondary | ICD-10-CM

## 2014-09-04 DIAGNOSIS — G4733 Obstructive sleep apnea (adult) (pediatric): Secondary | ICD-10-CM

## 2014-09-04 DIAGNOSIS — J302 Other seasonal allergic rhinitis: Secondary | ICD-10-CM

## 2014-09-04 MED ORDER — AZELASTINE-FLUTICASONE 137-50 MCG/ACT NA SUSP
2.0000 | Freq: Every day | NASAL | Status: DC
Start: 2014-09-04 — End: 2014-09-18

## 2014-09-04 NOTE — Progress Notes (Signed)
05/06/11- 70 yoF never smoker followed for OSA, allergic rhinitis, complicated by AFib/coumadin LOV 05/05/10 She has had more persistent problems with nasal stuffiness and congestion and postnasal drip with global headache. This interferes with her ability to wear CPAP comfortably. She is using a fullface mask from [place] but she doesn't know her pressure. Ongoing problem with dryness of the eyes and nose. Since last here has had hip and back surgeries, treatment for atrial fibrillation with Coumadin.  10/04/12- 5 yoF never smoker followed for OSA, allergic rhinitis, complicated by AFib/coumadin FOLLOWS FOR: request new rx for flonase.  also c/o sinus congestion, PND, increased SOB, wheezing, throat congestion, prod cough in the AM with thick green mucus w/ the humidity CPAP 10/ [employer] Ran out of Flonase-increased nasal congestion. Albuterol causes tremor. AFib is controlled. Since last here had both hips replaced and lumbar spine surgery. No respiratory complications during surgeries.  11/09/13-  73 yoF never smoker followed for OSA, allergic rhinitis, complicated by AFib/coumadin FOLLOWS FOR:Dec 2014-had cough so bad and heart beating so fast(has Afib) 02-2013 through 04-2013; spent 5 days in [place] to help stop cough to correct heart. Another hospital stay in Jan2015 for cough again-Dr Koleen Nimrod [facility] [place]) saw her and did 3 IgE tests that show levels above 1800 and determined that allergies were the cause. Had allergy test at [facility];Dr Manuella Ghazi office to give injections(had reactions and stopped). [facility] told her to take antihistamines BID while on injections.   02/09/14- 73 yoF never smoker followed for OSA, allergic rhinitis, complicated by AFib/coumadin FOLLOWS FOR:Pt states she is dong pretty well overall; cough and wheezing(? GERD). Continues to wear CPAP 10/ [facility] every night (will need to order download and enroll in [place])   Has had flu and pneumonia vaccine by  her report Reports A. Fib 1 week ago. Today stood up from water fountain, felt weak and lightheaded on arrival. Pulse was 44. EKG shows sinus bradycardia. She brings a record of pulses, blood pressures and peak flow scores since mid October. Pulse rate usually 50-70. Peak flows usually 280-300. EKG- SB 44/ min on cardizem 1/2 x 120 mg TID, flecainide100 BID Breathing has been well controlled. Occasionally wakes with congestion and phlegm she blames on reflux. Rhinitis managed with Nasalcrom plus Flonase  09/04/14- 73 yoF never smoker followed for OSA, allergic rhinitis, complicated by AFib/coumadin Reports:CPAP10/ [facility] nightly, ~6-7 hours, pt. was getting afib everynight went from a small  to med. mask . Pt. is having dry mouth while wearing CPAP. Watery nasal discharge postnasal drip irritates throat. Nasal congestion noted as soon as she lies down. Also aware of reflux. We are reducing CPAP to 9  ROS-see HPI Constitutional:   No-   weight loss, night sweats, fevers, chills, fatigue, lassitude. HEENT:   No-  headaches, difficulty swallowing, tooth/dental problems, sore throat,       No-  sneezing, itching,  +ear ache,  +nasal congestion, +post nasal drip,  CV:  No-   chest pain, orthopnea, PND, swelling in lower extremities, anasarca, dizziness, palpitations Resp: +  shortness of breath with exertion or at rest.                productive cough,  No non-productive cough,  No- coughing up of blood.              No-change in color of mucus.  +No- wheezing.   Skin: No-   rash or lesions. GI:  No-   heartburn, indigestion, abdominal pain,  nausea, vomiting, GU:  MS:  No-   joint pain or swelling. . Neuro-     nothing unusual Psych:  No- change in mood or affect. No depression or anxiety.  No memory loss.   OBJ- Physical Exam General- Alert, Oriented, Affect-appropriate, Distress- none acute, + overweight Skin- rash-none, lesions- none, excoriation- none Lymphadenopathy- none Head-  atraumatic            Eyes- Gross vision intact, PERRLA, conjunctivae and secretions clear            Ears- Hearing, canals-normal            Nose- +turbinate edema, no-Septal dev, mucus, polyps, erosion, perforation             Throat- Mallampati III-IV , mucosa clear, not red , drainage- none, tonsils- atrophic Neck- flexible , trachea midline, no stridor , thyroid nl, carotid no bruit Chest - symmetrical excursion , unlabored           Heart/CV- RRR/ 52 to my palpation now , no murmur , no gallop  , no rub, nl                          s1 s2                  - JVD- none , edema- none, stasis changes- none, varices- none           Lung- clear to P&A, wheeze- none, cough- none , dullness-none, rub- none           Chest wall-  Abd- Br/ Gen/ Rectal- Not done, not indicated Extrem- cyanosis- none, clubbing, none, atrophy- none, strength- nl Neuro- grossly intact to observation

## 2014-09-04 NOTE — Patient Instructions (Signed)
Sample Dymista nasal spray   1-2 puffs each nostril once daily at bedtime   Try this for awhile instead of Nasalcrom and Flonase  Order- DME [facility] - reduce CPAP to 9,  Then download for pressure compliance,                                         Please work with patient on mask fit and comfort   Dx OSA  Please call as needed

## 2014-09-05 MED ORDER — RIVAROXABAN 20 MG PO TABS: 20.0000 mg | ORAL_TABLET | Freq: Every day | ORAL | Status: DC

## 2014-09-05 NOTE — Telephone Encounter (Signed)
Follow up      For [facility] Pt states June 21st is a good day for her procedure.  Pt started taking xarelto 20mg  Monday night

## 2014-09-05 NOTE — Telephone Encounter (Signed)
Spoke with patient and she is going to keep 6/21 for ablation as she started [employer]

## 2014-09-09 NOTE — Assessment & Plan Note (Signed)
Air pressure is bothering her a little. Plan-DME [facility] reduce CPAP from 10-9

## 2014-09-09 NOTE — Assessment & Plan Note (Signed)
Bothersome postnasal drainage sensation mainly when she is lying down. I don't think it is caused by her CPAP. Plan-try sample Dymista instead of Flonase.

## 2014-09-11 ENCOUNTER — Other Ambulatory Visit: Payer: Self-pay | Admitting: *Deleted

## 2014-09-11 DIAGNOSIS — I4892 Unspecified atrial flutter: Secondary | ICD-10-CM

## 2014-09-11 DIAGNOSIS — I10 Essential (primary) hypertension: Secondary | ICD-10-CM

## 2014-09-11 DIAGNOSIS — Z01810 Encounter for preprocedural cardiovascular examination: Secondary | ICD-10-CM

## 2014-09-11 LAB — PROTIME-INR

## 2014-09-17 MED ORDER — SODIUM CHLORIDE 0.9 % IV SOLN: INTRAVENOUS | Status: DC

## 2014-09-18 ENCOUNTER — Encounter (HOSPITAL_COMMUNITY)
Admission: RE | Disposition: A | Discharge status: Home / Self Care | Payer: Self-pay | Source: Ambulatory Visit | Attending: Internal Medicine

## 2014-09-18 ENCOUNTER — Ambulatory Visit (HOSPITAL_BASED_OUTPATIENT_CLINIC_OR_DEPARTMENT_OTHER)
Admission: RE | Admit: 2014-09-18 | Discharge: 2014-09-18 | Disposition: A | Discharge status: Home / Self Care | Payer: Medicare Other | Source: Ambulatory Visit | Attending: Nurse Practitioner | Admitting: Nurse Practitioner

## 2014-09-18 ENCOUNTER — Ambulatory Visit (HOSPITAL_COMMUNITY)
Admission: RE | Admit: 2014-09-18 | Discharge: 2014-09-19 | Disposition: A | Discharge status: Home / Self Care | Payer: Medicare Other | Source: Ambulatory Visit | Attending: Internal Medicine | Admitting: Internal Medicine

## 2014-09-18 ENCOUNTER — Ambulatory Visit (HOSPITAL_COMMUNITY): Payer: Medicare Other | Admitting: Anesthesiology

## 2014-09-18 ENCOUNTER — Encounter (HOSPITAL_COMMUNITY): Payer: Self-pay | Admitting: *Deleted

## 2014-09-18 DIAGNOSIS — I480 Paroxysmal atrial fibrillation: Secondary | ICD-10-CM | POA: Diagnosis present

## 2014-09-18 DIAGNOSIS — E785 Hyperlipidemia, unspecified: Secondary | ICD-10-CM | POA: Diagnosis not present

## 2014-09-18 DIAGNOSIS — Z7901 Long term (current) use of anticoagulants: Secondary | ICD-10-CM | POA: Diagnosis not present

## 2014-09-18 DIAGNOSIS — I4892 Unspecified atrial flutter: Secondary | ICD-10-CM | POA: Diagnosis not present

## 2014-09-18 DIAGNOSIS — Z79899 Other long term (current) drug therapy: Secondary | ICD-10-CM | POA: Insufficient documentation

## 2014-09-18 DIAGNOSIS — I4891 Unspecified atrial fibrillation: Secondary | ICD-10-CM | POA: Diagnosis present

## 2014-09-18 DIAGNOSIS — E669 Obesity, unspecified: Secondary | ICD-10-CM | POA: Diagnosis not present

## 2014-09-18 DIAGNOSIS — J45909 Unspecified asthma, uncomplicated: Secondary | ICD-10-CM | POA: Diagnosis not present

## 2014-09-18 DIAGNOSIS — I340 Nonrheumatic mitral (valve) insufficiency: Secondary | ICD-10-CM | POA: Diagnosis not present

## 2014-09-18 DIAGNOSIS — Z6834 Body mass index (BMI) 34.0-34.9, adult: Secondary | ICD-10-CM | POA: Diagnosis not present

## 2014-09-18 DIAGNOSIS — J449 Chronic obstructive pulmonary disease, unspecified: Secondary | ICD-10-CM | POA: Insufficient documentation

## 2014-09-18 DIAGNOSIS — F418 Other specified anxiety disorders: Secondary | ICD-10-CM | POA: Insufficient documentation

## 2014-09-18 DIAGNOSIS — I10 Essential (primary) hypertension: Secondary | ICD-10-CM | POA: Insufficient documentation

## 2014-09-18 DIAGNOSIS — Z7951 Long term (current) use of inhaled steroids: Secondary | ICD-10-CM | POA: Diagnosis not present

## 2014-09-18 DIAGNOSIS — G4733 Obstructive sleep apnea (adult) (pediatric): Secondary | ICD-10-CM | POA: Insufficient documentation

## 2014-09-18 DIAGNOSIS — I483 Typical atrial flutter: Secondary | ICD-10-CM | POA: Diagnosis not present

## 2014-09-18 HISTORY — PX: ELECTROPHYSIOLOGIC STUDY: SHX172A

## 2014-09-18 HISTORY — PX: TEE WITHOUT CARDIOVERSION: SHX5443

## 2014-09-18 LAB — POCT ACTIVATED CLOTTING TIME
Activated Clotting Time: 337 seconds
Activated Clotting Time: 300 seconds
Activated Clotting Time: 270 seconds
Activated Clotting Time: 134 seconds

## 2014-09-18 LAB — MRSA PCR SCREENING: MRSA by PCR: NEGATIVE

## 2014-09-18 SURGERY — ATRIAL FIBRILLATION ABLATION
Anesthesia: General

## 2014-09-18 SURGERY — ECHOCARDIOGRAM, TRANSESOPHAGEAL
Anesthesia: Moderate Sedation

## 2014-09-18 MED ORDER — RIVAROXABAN 20 MG PO TABS
20.0000 mg | ORAL_TABLET | Freq: Every day | ORAL | Status: DC
  Administered 2014-09-18: 20 mg via ORAL
  Filled 2014-09-18 (×2): qty 1

## 2014-09-18 MED ORDER — MIDAZOLAM HCL 5 MG/ML IJ SOLN
INTRAMUSCULAR | Status: AC
  Filled 2014-09-18: qty 2

## 2014-09-18 MED ORDER — SODIUM CHLORIDE 0.9 % IJ SOLN
3.0000 mL | Freq: Two times a day (BID) | INTRAMUSCULAR | Status: DC
  Administered 2014-09-18 (×2): 3 mL via INTRAVENOUS

## 2014-09-18 MED ORDER — SODIUM CHLORIDE 0.9 % IJ SOLN: 3.0000 mL | INTRAMUSCULAR | Status: DC | PRN

## 2014-09-18 MED ORDER — SODIUM CHLORIDE 0.9 % IV SOLN: 250.0000 mL | INTRAVENOUS | Status: DC | PRN

## 2014-09-18 MED ORDER — ACETAMINOPHEN 325 MG PO TABS: 650.0000 mg | ORAL_TABLET | ORAL | Status: DC | PRN

## 2014-09-18 MED ORDER — FENTANYL CITRATE (PF) 100 MCG/2ML IJ SOLN
INTRAMUSCULAR | Status: DC | PRN
  Administered 2014-09-18 (×2): 25 ug via INTRAVENOUS
  Administered 2014-09-18: 50 ug via INTRAVENOUS
  Administered 2014-09-18 (×6): 25 ug via INTRAVENOUS

## 2014-09-18 MED ORDER — ONDANSETRON HCL 4 MG/2ML IJ SOLN: 4.0000 mg | Freq: Four times a day (QID) | INTRAMUSCULAR | Status: DC | PRN

## 2014-09-18 MED ORDER — DOBUTAMINE IN D5W 4-5 MG/ML-% IV SOLN
INTRAVENOUS | Status: AC
  Filled 2014-09-18: qty 250

## 2014-09-18 MED ORDER — FENTANYL CITRATE (PF) 100 MCG/2ML IJ SOLN
INTRAMUSCULAR | Status: AC
  Filled 2014-09-18: qty 2

## 2014-09-18 MED ORDER — FENTANYL CITRATE (PF) 100 MCG/2ML IJ SOLN
INTRAMUSCULAR | Status: DC | PRN
  Administered 2014-09-18: 25 ug via INTRAVENOUS

## 2014-09-18 MED ORDER — PROTAMINE SULFATE 10 MG/ML IV SOLN
INTRAVENOUS | Status: DC | PRN
  Administered 2014-09-18: 30 mg via INTRAVENOUS

## 2014-09-18 MED ORDER — BUTAMBEN-TETRACAINE-BENZOCAINE 2-2-14 % EX AERO
INHALATION_SPRAY | CUTANEOUS | Status: DC | PRN
  Administered 2014-09-18: 2 via TOPICAL

## 2014-09-18 MED ORDER — DOBUTAMINE IN D5W 4-5 MG/ML-% IV SOLN
INTRAVENOUS | Status: DC | PRN
  Administered 2014-09-18: 20 ug/kg/min via INTRAVENOUS

## 2014-09-18 MED ORDER — MIDAZOLAM HCL 10 MG/2ML IJ SOLN
INTRAMUSCULAR | Status: DC | PRN
  Administered 2014-09-18: 2 mg via INTRAVENOUS

## 2014-09-18 MED ORDER — ONDANSETRON HCL 4 MG/2ML IJ SOLN
INTRAMUSCULAR | Status: DC | PRN
  Administered 2014-09-18: 4 mg via INTRAVENOUS

## 2014-09-18 MED ORDER — PROPOFOL 10 MG/ML IV BOLUS
INTRAVENOUS | Status: DC | PRN
  Administered 2014-09-18: 120 mg via INTRAVENOUS

## 2014-09-18 MED ORDER — IOHEXOL 350 MG/ML SOLN
INTRAVENOUS | Status: DC | PRN
  Administered 2014-09-18: 105 mL via INTRAVENOUS

## 2014-09-18 MED ORDER — HYDROCODONE-ACETAMINOPHEN 5-325 MG PO TABS
1.0000 | ORAL_TABLET | ORAL | Status: DC | PRN
  Administered 2014-09-19: 1 via ORAL
  Filled 2014-09-18: qty 1

## 2014-09-18 MED ORDER — HEPARIN SODIUM (PORCINE) 1000 UNIT/ML IJ SOLN
INTRAMUSCULAR | Status: AC
Start: 2014-09-18 — End: 2014-09-18
  Filled 2014-09-18: qty 1

## 2014-09-18 MED ORDER — LIDOCAINE HCL (CARDIAC) 20 MG/ML IV SOLN
INTRAVENOUS | Status: DC | PRN
  Administered 2014-09-18: 60 mg via INTRAVENOUS

## 2014-09-18 MED ORDER — HYDROCHLOROTHIAZIDE 25 MG PO TABS
25.0000 mg | ORAL_TABLET | Freq: Every day | ORAL | Status: DC
  Administered 2014-09-18 – 2014-09-19 (×2): 25 mg via ORAL
  Filled 2014-09-18 (×2): qty 1

## 2014-09-18 MED ORDER — BUPIVACAINE HCL (PF) 0.25 % IJ SOLN
INTRAMUSCULAR | Status: DC | PRN
  Administered 2014-09-18: 30 mL

## 2014-09-18 MED ORDER — HEPARIN SODIUM (PORCINE) 1000 UNIT/ML IJ SOLN
INTRAMUSCULAR | Status: DC | PRN
  Administered 2014-09-18: 12000 [IU] via INTRAVENOUS

## 2014-09-18 MED ORDER — VALSARTAN-HYDROCHLOROTHIAZIDE 320-25 MG PO TABS: 1.0000 | ORAL_TABLET | Freq: Every day | ORAL | Status: DC

## 2014-09-18 MED ORDER — PANTOPRAZOLE SODIUM 40 MG PO TBEC
40.0000 mg | DELAYED_RELEASE_TABLET | Freq: Every day | ORAL | Status: DC
  Administered 2014-09-18 – 2014-09-19 (×2): 40 mg via ORAL
  Filled 2014-09-18 (×2): qty 1

## 2014-09-18 MED ORDER — EPHEDRINE SULFATE 50 MG/ML IJ SOLN
INTRAMUSCULAR | Status: DC | PRN
  Administered 2014-09-18: 5 mg via INTRAVENOUS
  Administered 2014-09-18: 10 mg via INTRAVENOUS

## 2014-09-18 MED ORDER — DIPHENHYDRAMINE HCL 50 MG/ML IJ SOLN
INTRAMUSCULAR | Status: AC
  Filled 2014-09-18: qty 1

## 2014-09-18 MED ORDER — BUPIVACAINE HCL (PF) 0.25 % IJ SOLN
INTRAMUSCULAR | Status: AC
  Filled 2014-09-18: qty 30

## 2014-09-18 MED ORDER — SODIUM CHLORIDE 0.9 % IV SOLN
INTRAVENOUS | Status: DC
  Administered 2014-09-18: 500 mL via INTRAVENOUS

## 2014-09-18 MED ORDER — IRBESARTAN 300 MG PO TABS
300.0000 mg | ORAL_TABLET | Freq: Every day | ORAL | Status: DC
  Administered 2014-09-18 – 2014-09-19 (×2): 300 mg via ORAL
  Filled 2014-09-18 (×2): qty 1

## 2014-09-18 MED ORDER — BUDESONIDE 0.5 MG/2ML IN SUSP
0.5000 mg | Freq: Two times a day (BID) | RESPIRATORY_TRACT | Status: DC
  Administered 2014-09-18 – 2014-09-19 (×2): 0.5 mg via RESPIRATORY_TRACT
  Filled 2014-09-18 (×4): qty 2

## 2014-09-18 MED ORDER — HEPARIN SODIUM (PORCINE) 1000 UNIT/ML IJ SOLN
INTRAMUSCULAR | Status: DC | PRN
  Administered 2014-09-18: 1000 [IU] via INTRAVENOUS

## 2014-09-18 MED ORDER — LACTATED RINGERS IV SOLN
INTRAVENOUS | Status: DC | PRN
  Administered 2014-09-18: 11:00:00 via INTRAVENOUS

## 2014-09-18 SURGICAL SUPPLY — 26 items
BAG SNAP BAND KOVER 36X36 (MISCELLANEOUS) ×3 IMPLANT
BLANKET WARM UNDERBOD FULL ACC (MISCELLANEOUS) IMPLANT
CATH DIAG 6FR PIGTAIL (CATHETERS) ×3 IMPLANT
CATH NAVISTAR SMARTTOUCH DF (ABLATOR) ×3 IMPLANT
CATH SOUNDSTAR 3D IMAGING (CATHETERS) ×3 IMPLANT
CATH VARIABLE LASSO NAV 2515 (CATHETERS) ×3 IMPLANT
CATH WEBSTER BI DIR CS D-F CRV (CATHETERS) ×3 IMPLANT
COVER SWIFTLINK CONNECTOR (BAG) ×3 IMPLANT
NEEDLE TRANSEP BRK 71CM 407200 (NEEDLE) ×3 IMPLANT
PACK EP LATEX FREE (CUSTOM PROCEDURE TRAY) ×3
PACK EP LF (CUSTOM PROCEDURE TRAY) ×1 IMPLANT
PAD DEFIB LIFELINK (PAD) ×3 IMPLANT
PATCH CARTO3 (PAD) ×3 IMPLANT
SHEATH AVANTI 11F 11CM (SHEATH) ×3 IMPLANT
SHEATH PINNACLE 6F 10CM (SHEATH) IMPLANT
SHEATH PINNACLE 7F 10CM (SHEATH) ×6 IMPLANT
SHEATH PINNACLE 8F 10CM (SHEATH) IMPLANT
SHEATH PINNACLE 9F 10CM (SHEATH) ×3 IMPLANT
SHEATH SWARTZ TS SL2 63CM 8.5F (SHEATH) ×3 IMPLANT
SHIELD RADPAD SCOOP 12X17 (MISCELLANEOUS) ×3 IMPLANT
SYR MEDRAD MARK V 150ML (SYRINGE) ×3 IMPLANT
TUBING ART PRESS 72  MALE/FEM (TUBING) ×2
TUBING ART PRESS 72 MALE/FEM (TUBING) ×1 IMPLANT
TUBING CONTRAST HIGH PRESS 48 (TUBING) ×3 IMPLANT
TUBING COOLFLOW (TUBING) ×3 IMPLANT
TUBING SMART ABLATE COOLFLOW (TUBING) IMPLANT

## 2014-09-18 NOTE — Anesthesia Procedure Notes (Signed)
Procedure Name: LMA Insertion Date/Time: 09/18/2014 11:05 AM Performed by: Susa Loffler Pre-anesthesia Checklist: Patient identified, Timeout performed, Emergency Drugs available, Suction available and Patient being monitored Patient Re-evaluated:Patient Re-evaluated prior to inductionOxygen Delivery Method: Circle system utilized Preoxygenation: Pre-oxygenation with 100% oxygen Intubation Type: IV induction LMA: LMA inserted LMA Size: 4.0 Number of attempts: 1 Placement Confirmation: positive ETCO2,  CO2 detector and breath sounds checked- equal and bilateral Tube secured with: Tape Dental Injury: Teeth and Oropharynx as per pre-operative assessment

## 2014-09-18 NOTE — Interval H&P Note (Signed)
History and Physical Interval Note:  09/18/2014 8:17 AM  [name] [name]  has presented today for surgery, with the diagnosis of AFIB  The various methods of treatment have been discussed with the patient and family. After consideration of risks, benefits and other options for treatment, the patient has consented to  Procedure(s): TRANSESOPHAGEAL ECHOCARDIOGRAM (TEE) (N/A) as a surgical intervention .  The patient's history has been reviewed, patient examined, no change in status, stable for surgery.  I have reviewed the patient's chart and labs.  Questions were answered to the patient's satisfaction.     Dalton [employer]

## 2014-09-18 NOTE — CV Procedure (Signed)
Procedure:  TEE  Indication: Paroxysmal atrial fibrillation, pre-ablation.   Sedation: Versed 2 mg IV, Fentanyl 25 mcg IV  Findings: Please see echo section for full report.  Normal LV size and systolic function, EF 20-72%.  Mild LV hypertrophy.  Normal RV size and systolic function.  Moderate biatrial enlargement.  No LAA thrombus.  Mild mitral regurgitation.  Trileaflet aortic valve with no regurgitation or stenosis.  Peak RV-RA gradient 33 mmHg.  Negative bubble study.   May proceed to ablation.   Loralie Champagne 09/18/2014 8:34 AM

## 2014-09-18 NOTE — Anesthesia Postprocedure Evaluation (Signed)
  Anesthesia Post-op Note  Patient: [name] [name]  Procedure(s) Performed: Procedure(s): Atrial Fibrillation Ablation (N/A)  Patient Location: PACU  Anesthesia Type:General  Level of Consciousness: awake and alert   Airway and Oxygen Therapy: Patient Spontanous Breathing  Post-op Pain: none  Post-op Assessment: Post-op Vital signs reviewed, Patient's Cardiovascular Status Stable and Respiratory Function Stable  Post-op Vital Signs: Reviewed  Filed Vitals:   09/18/14 1615  BP: 145/59  Pulse: 67  Temp:   Resp: 15    Complications: No apparent anesthesia complications

## 2014-09-18 NOTE — Procedures (Signed)
Pt has her home CPAP machine. RT has set up the machine and made sure that all connections are in place.  At this time the pt is not ready to be placed on the machine.  When ready,  she will place herself. RT will check on pt at a later time.

## 2014-09-18 NOTE — Transfer of Care (Signed)
Immediate Anesthesia Transfer of Care Note  Patient: [name] [name]  Procedure(s) Performed: Procedure(s): Atrial Fibrillation Ablation (N/A)  Patient Location: Cath Lab  Anesthesia Type:General  Level of Consciousness: awake, alert  and oriented  Airway & Oxygen Therapy: Patient Spontanous Breathing and Patient connected to nasal cannula oxygen  Post-op Assessment: Report given to RN and Post -op Vital signs reviewed and stable  Post vital signs: Reviewed and stable  Last Vitals:  Filed Vitals:   09/18/14 1006  BP:   Pulse: 44  Temp:   Resp: 14    Complications: No apparent anesthesia complications

## 2014-09-18 NOTE — H&P (Signed)
PCP: Monico Blitz, MD Cardiologist: Dr Domenic Polite Primary Electrophysiologist: Thompson Grayer, MD   Chief Complaint  Patient presents with  . Atrial Fibrillation    History of Present Illness: [name] [name] is a 74 y.o. female who presents today for afib ablation. She has a h/o afib and atrial flutter. She has obesity and sleep apnea for which she is compliant with CPAP.  She did well initially with flecainide. Unfortunately, she has had increasing frequency and duration of atrial fibrillation despite this medicine. Additional medical therapy has been limited by bradycardia. She reports that she now has afib most days (since 07/09/14). She was cardioverted in December. She feels that her afib continues to "come and go". She has fatigue and SOB with her afib. She remains active.  Today, she denies symptoms of palpitations, chest pain, orthopnea, PND, lower extremity edema, claudication, dizziness, presyncope, syncope, bleeding, or neurologic sequela. The patient is tolerating medications without difficulties and is otherwise without complaint today.    Past Medical History  Diagnosis Date  . Allergic rhinitis   . Paroxysmal atrial fibrillation     Element of tachycardia bradycardia syndrome  . Hyperlipidemia   . Essential hypertension, benign   . Anxiety   . Depression   . Lumbar disc disease   . OSA (obstructive sleep apnea)     uses CPAP  . Atrial flutter   . Obesity   . Respiratory failure     2 recent hospitalizations for hypoxic respiratory failure/ reactive airway disease  . COPD (chronic obstructive pulmonary disease)    Past Surgical History  Procedure Laterality Date  . Cataract extraction    . Lasik      Both eyes  . Total hip arthroplasty  2010    Right  . Anterior fusion lumbar spine    . Colonoscopy N/A 08/04/2012    Procedure: COLONOSCOPY; Surgeon:  Rogene Houston, MD; Location: [facility] SUITE; Service: Endoscopy; Laterality: N/A; 730-rescheduled to [place] notified pt  . Cardioversion N/A 03/28/2014    Procedure: CARDIOVERSION; Surgeon: Fay Records, MD; Location: [facility] ORS; Service: Cardiovascular; Laterality: N/A;     Current Outpatient Prescriptions  Medication Sig Dispense Refill  . antiseptic oral rinse (BIOTENE) LIQD 15 mLs by Mouth Rinse route as needed (Dry Mouth).     Marland Kitchen atorvastatin (LIPITOR) 10 MG tablet Take 10 mg by mouth daily.     . Biotin 1000 MCG tablet Take 5,000 mcg by mouth daily.     . budesonide (PULMICORT) 0.5 MG/2ML nebulizer solution Take 0.5 mg by nebulization 2 (two) times daily.     Marland Kitchen CALCIUM-MAGNESIUM-ZINC PO Take 1 capsule by mouth 2 (two) times daily.     . cromolyn (NASALCROM) 5.2 MG/ACT nasal spray Place 1 spray into both nostrils every 4 (four) hours as needed for allergies. Every 4-6 hours prn    . diltiazem (CARDIZEM) 120 MG tablet Take 0.5 tablets (60 mg total) by mouth 3 (three) times daily. 45 tablet 6  . diltiazem (CARDIZEM) 30 MG tablet Take 1 tablet (30 mg total) by mouth as needed (palpitations). 30 tablet 2  . docusate-casanthranol (PERICOLACE) 100-30 MG per capsule Take 1 capsule by mouth daily as needed for constipation.     . flecainide (TAMBOCOR) 100 MG tablet Take 1 tablet (100 mg total) by mouth 2 (two) times daily. 60 tablet 4  . fluticasone (FLONASE) 50 MCG/ACT nasal spray Place 2 sprays into the nose daily as needed for allergies. (Patient taking differently: Place 2 sprays into  the nose daily. ) 16 g prn  . montelukast (SINGULAIR) 10 MG tablet Take 10 mg by mouth at bedtime.    Marland Kitchen omeprazole (PRILOSEC) 20 MG capsule Take 1 capsule by mouth daily.     . valsartan-hydrochlorothiazide (DIOVAN-HCT) 320-25 MG per tablet Take 1 tablet by mouth daily. 30 tablet 6  . warfarin (COUMADIN) 5 MG tablet Take  2.5-5 mg by mouth daily. 2.5 mg on Mondays and 5 mg on all other days. MANAGED BY DR. [facility]     No current facility-administered medications for this visit.    Allergies: Bupropion hcl; Escitalopram oxalate; Fluticasone-salmeterol; Lexapro; Lisinopril; Serevent; Wellbutrin; Amoxicillin; Macrodantin; and Penicillins   Social History: The patient  reports that she has never smoked. She has never used smokeless tobacco. She reports that she does not drink alcohol or use illicit drugs.   Family History: The patient's family history includes Cancer in her mother; Colon cancer in her other; Heart attack in her father.    ROS: Please see the history of present illness. All other systems are reviewed and negative.    PHYSICAL EXAM: VS:  Filed Vitals:   09/18/14 0708  BP: 158/52  Temp: 97.6 F (36.4 C)  Resp: 14    GEN: Well nourished, well developed, overweight, in no acute distress  HEENT: normal  Neck: no JVD, carotid bruits, or masses Cardiac: RRR; no murmurs, rubs, or gallops,no edema  Respiratory: clear to auscultation bilaterally, normal work of breathing GI: soft, nontender, nondistended, + BS MS: significant lower extremity atrophy  Skin: warm and dry  Neuro: Strength and sensation are intact Psych: euthymic mood, full affect   Recent Labs: 03/26/2014: BUN 19; Creatinine 1.06; Hemoglobin 13.8; Platelets 261; Potassium 4.2; Sodium 140    Lipid Panel   Labs (Brief)    No results found for: CHOL, TRIG, HDL, CHOLHDL, VLDL, LDLCALC, LDLDIRECT     Wt Readings from Last 3 Encounters:  07/27/14 206 lb (93.441 kg)  07/17/14 205 lb (92.987 kg)  04/25/14 201 lb (91.173 kg)     Other studies Reviewed: Additional studies/ records that were reviewed today include: echo 1/15  Review of the above records today demonstrates: preserved EF, mild atrial dilatation, no significant valvular disease Dr Chuck Hint notes are also reviewed   ASSESSMENT  AND PLAN:  1. Paroxysmal atrial fibrillation and atrial flutter The patient has symptomatic atrial arrhythmias despite medical therapy with flecainide. She has obesity and OSA. She has not had any real results with lifestyle modification but is compliant with CPAP. Therapeutic strategies for afib/ atrial flutter including medicine and ablation were discussed in detail with the patient today.  Risk, benefits, and alternatives to EP study and radiofrequency ablation were discussed in detail today. These risks include but are not limited to stroke, bleeding, vascular damage, tamponade, perforation, damage to the esophagus, lungs, and other structures, pulmonary vein stenosis, worsening renal function, and death. The patient understands these risk and wishes to proceed.  She has been switched from coumadin to xarelto due to labile INRs.  She has done well with xarelto without interruption for several weeks.  TEE this am prior to ablation.

## 2014-09-18 NOTE — Progress Notes (Signed)
Site area: RFV x 3 Site Prior to Removal:  Level 0 Pressure Applied For: 22min Manual:   yes Patient Status During Pull: stable  Post Pull Site:  Level 0 Post Pull Instructions Given: yes  Post Pull Pulses Present:palpable  Dressing Applied:  yes Bedrest begins @ 2763 Comments:

## 2014-09-18 NOTE — Anesthesia Preprocedure Evaluation (Addendum)
Anesthesia Evaluation  Patient identified by MRN, date of birth, ID band Patient awake    Reviewed: Allergy & Precautions, H&P , NPO status , Patient's Chart, lab work & pertinent test results  Airway Mallampati: III  TM Distance: >3 FB Neck ROM: Full    Dental no notable dental hx. (+) Teeth Intact, Dental Advisory Given   Pulmonary asthma , sleep apnea , COPD COPD inhaler,  breath sounds clear to auscultation  Pulmonary exam normal       Cardiovascular hypertension, Pt. on medications + dysrhythmias Atrial Fibrillation Rhythm:Irregular Rate:Normal     Neuro/Psych Anxiety Depression negative neurological ROS     GI/Hepatic negative GI ROS, Neg liver ROS,   Endo/Other  negative endocrine ROS  Renal/GU negative Renal ROS  negative genitourinary   Musculoskeletal   Abdominal   Peds  Hematology negative hematology ROS (+)   Anesthesia Other Findings   Reproductive/Obstetrics negative OB ROS                            Anesthesia Physical Anesthesia Plan  ASA: III  Anesthesia Plan: General   Post-op Pain Management:    Induction: Intravenous  Airway Management Planned: LMA  Additional Equipment:   Intra-op Plan:   Post-operative Plan: Extubation in OR  Informed Consent: I have reviewed the patients History and Physical, chart, labs and discussed the procedure including the risks, benefits and alternatives for the proposed anesthesia with the patient or authorized representative who has indicated his/her understanding and acceptance.   Dental advisory given  Plan Discussed with: CRNA  Anesthesia Plan Comments:         Anesthesia Quick Evaluation

## 2014-09-18 NOTE — Progress Notes (Signed)
  Echocardiogram Echocardiogram Transesophageal has been performed.  Diamond Nickel 09/18/2014, 8:36 AM

## 2014-09-19 ENCOUNTER — Encounter (HOSPITAL_COMMUNITY): Payer: Self-pay | Admitting: Internal Medicine

## 2014-09-19 DIAGNOSIS — I10 Essential (primary) hypertension: Secondary | ICD-10-CM | POA: Diagnosis not present

## 2014-09-19 DIAGNOSIS — I340 Nonrheumatic mitral (valve) insufficiency: Secondary | ICD-10-CM | POA: Diagnosis not present

## 2014-09-19 DIAGNOSIS — G4733 Obstructive sleep apnea (adult) (pediatric): Secondary | ICD-10-CM | POA: Diagnosis not present

## 2014-09-19 DIAGNOSIS — I480 Paroxysmal atrial fibrillation: Secondary | ICD-10-CM | POA: Diagnosis not present

## 2014-09-19 DIAGNOSIS — I4892 Unspecified atrial flutter: Secondary | ICD-10-CM

## 2014-09-19 LAB — BASIC METABOLIC PANEL
ANION GAP: 12 (ref 5–15)
BUN: 12 mg/dL (ref 6–20)
CO2: 23 mmol/L (ref 22–32)
Calcium: 8.4 mg/dL — ABNORMAL LOW (ref 8.9–10.3)
Chloride: 102 mmol/L (ref 101–111)
Creatinine, Ser: 0.9 mg/dL (ref 0.44–1.00)
GFR calc non Af Amer: >60 mL/min (ref >60)
Glucose, Bld: 121 mg/dL — ABNORMAL HIGH (ref 65–99)
POTASSIUM: 3.7 mmol/L (ref 3.5–5.1)
SODIUM: 137 mmol/L (ref 135–145)

## 2014-09-19 NOTE — Discharge Summary (Signed)
ELECTROPHYSIOLOGY PROCEDURE DISCHARGE SUMMARY    Patient ID: [name] [name],  MRN: [id number], DOB/AGE: 1940-06-25 74 y.o.  Admit date: 09/18/2014 Discharge date: 09/19/2014  Primary Care Physician: Monico Blitz, MD Primary Cardiologist: Domenic Polite Electrophysiologist: Thompson Grayer, MD  Primary Discharge Diagnosis:  Paroxysmal atrial fibrillation and atrial flutter status post ablation this admission  Secondary Discharge Diagnosis:  1.  Hypertension 2.  Hyperlipidemia 3.  Sleep apnea - on CPAP 4.  COPD  Procedures This Admission:  1.  Electrophysiology study and radiofrequency catheter ablation on 09/18/14 by Dr Thompson Grayer.  This study demonstrated sinus rhythm upon presentation; rotational Angiography reveals a moderate sized left atrium with four separate pulmonary veins without evidence of pulmonary vein stenosis. There was also a small right middle pulmonary vein; successful electrical isolation and anatomical encircling of all four pulmonary veins with radiofrequency current. A WACA approach was used. Additional atrial ablation was performed at the junction of the SVC/RA; counter clockwise right atrial flutter induced with dobutamine. Cavo-tricuspid isthmus ablation was performed with complete bidirectional isthmus block achieved; no inducible arrhythmias following ablation;no early apparent complications..    Brief HPI: [name] [name] is a 74 y.o. female with a history of paroxysmal atrial fibrillation and atrial flutter.  They have failed medical therapy with Flecainide. Risks, benefits, and alternatives to catheter ablation of atrial fibrillation were reviewed with the patient who wished to proceed.  The patient underwent TEE prior to the procedure which demonstrated normal LV function and no LAA thrombus.    Hospital Course:  The patient was admitted and underwent EPS/RFCA of atrial fibrillation with details as outlined above.  They were monitored on telemetry  overnight which demonstrated sinus rhythm.  Groin was without complication on the day of discharge.  The patient was examined and considered to be stable for discharge.  Wound care and restrictions were reviewed with the patient.  The patient will be seen back by Dr Rayann Heman in 12 weeks for post ablation follow up.   This patients CHA2DS2-VASc Score and unadjusted Ischemic Stroke Rate (% per year) is equal to 3.2 % stroke rate/year from a score of 3 Above score calculated as 1 point each if present [CHF, HTN, DM, Vascular=MI/PAD/Aortic Plaque, Age if 65-74, or Female] Above score calculated as 2 points each if present [Age > 75, or Stroke/TIA/TE]   Physical Exam: Filed Vitals:   09/19/14 0308 09/19/14 0329 09/19/14 0730 09/19/14 0805  BP: 156/61   119/60  Pulse: 64     Temp:  98.3 F (36.8 C)  97.2 F (36.2 C)  TempSrc:  Oral  Oral  Resp: 17   16  Weight:      SpO2: 97%  100% 98%    GEN- The patient is well appearing, alert and oriented x 3 today.   HEENT: normocephalic, atraumatic; sclera clear, conjunctiva pink; hearing intact; oropharynx clear; neck supple, no JVP Lymph- no cervical lymphadenopathy Lungs- Clear to ausculation bilaterally, normal work of breathing.  No wheezes, rales, rhonchi Heart- Regular rate and rhythm, no murmurs, rubs or gallops, PMI not laterally displaced GI- soft, non-tender, non-distended, bowel sounds present, no hepatosplenomegaly Extremities- no clubbing, cyanosis, or edema; DP/PT/radial pulses 2+ bilaterally, groin without hematoma/bruit MS- no significant deformity or atrophy Skin- warm and dry, no rash or lesion Psych- euthymic mood, full affect Neuro- strength and sensation are intact   Labs:   Lab Results  Component Value Date   WBC 9.0 03/26/2014   HGB 13.8 03/26/2014   HCT 42.5  03/26/2014   MCV 82.7 03/26/2014   PLT 261 03/26/2014     Recent Labs Lab 09/19/14 0331  NA 137  K 3.7  CL 102  CO2 23  BUN 12  CREATININE 0.90    CALCIUM 8.4*  GLUCOSE 121*     Discharge Medications:    Medication List    TAKE these medications        antiseptic oral rinse Liqd  15 mLs by Mouth Rinse route as needed (Dry Mouth).     atorvastatin 10 MG tablet  Commonly known as:  LIPITOR  Take 10 mg by mouth daily.     Biotin 1000 MCG tablet  Take 5,000 mcg by mouth daily.     budesonide 0.5 MG/2ML nebulizer solution  Commonly known as:  PULMICORT  Take 0.5 mg by nebulization 2 (two) times daily.     CALCIUM-MAGNESIUM-ZINC PO  Take 1 capsule by mouth 2 (two) times daily.     cromolyn 5.2 MG/ACT nasal spray  Commonly known as:  NASALCROM  Place 1 spray into both nostrils every 4 (four) hours as needed for allergies. Every 4-6 hours prn     diltiazem 120 MG tablet  Commonly known as:  CARDIZEM  Take 0.5 tablets (60 mg total) by mouth 3 (three) times daily.     diltiazem 30 MG tablet  Commonly known as:  CARDIZEM  Take 1 tablet (30 mg total) by mouth as needed (palpitations).     docusate-casanthranol 100-30 MG per capsule  Commonly known as:  PERICOLACE  Take 1 capsule by mouth daily as needed for constipation.     erythromycin ethylsuccinate 400 MG tablet  Commonly known as:  EES  Take 800 mg by mouth once as needed (Take one hour prior to dental appts.).     flecainide 100 MG tablet  Commonly known as:  TAMBOCOR  Take 1 tablet (100 mg total) by mouth 2 (two) times daily.     fluticasone 50 MCG/ACT nasal spray  Commonly known as:  FLONASE  Place 2 sprays into the nose daily as needed for allergies.     montelukast 10 MG tablet  Commonly known as:  SINGULAIR  Take 10 mg by mouth at bedtime.     omeprazole 20 MG capsule  Commonly known as:  PRILOSEC  Take 1 capsule by mouth daily.     rivaroxaban 20 MG Tabs tablet  Commonly known as:  XARELTO  Take 20 mg by mouth daily with supper.     rivaroxaban 20 MG Tabs tablet  Commonly known as:  XARELTO  Take 1 tablet (20 mg total) by mouth daily with  supper.     valsartan-hydrochlorothiazide 320-25 MG per tablet  Commonly known as:  DIOVAN-HCT  Take 1 tablet by mouth daily.        Disposition:  Discharge Instructions    Diet - low sodium heart healthy    Complete by:  As directed      Increase activity slowly    Complete by:  As directed           Follow-up Information    Follow up with [facility] On 10/16/2014.   Why:  at [employer] information:   [place] [phone]      Follow up with Thompson Grayer, MD On 12/19/2014.   Specialty:  Cardiology   Why:  at 9:45Am   Contact information:   [address]  [place] [address] [phone]       Duration of Discharge Encounter: Greater than 30 minutes including physician time.  Signed, Chanetta Marshall, NP 09/19/2014 8:26 AM   I have seen, examined the patient, and reviewed the above assessment and plan. Exam, RRR.  Changes to above are made where necessary.  Resume home medicines.  Follow-up with Roderic Palau NP in 4 weeks with plans to stop flecainide at that time. I will see in 3 months  Co Sign: Thompson Grayer, MD 09/19/2014 8:26 AM

## 2014-09-19 NOTE — Discharge Instructions (Signed)
Information on my medicine - XARELTO (Rivaroxaban)  This medication education was reviewed with me or my healthcare representative as part of my discharge preparation.  The pharmacist that spoke with me during my hospital stay was:  Blossom Hoops, [facility]  Why was Xarelto prescribed for you? Xarelto was prescribed for you to reduce the risk of a blood clot forming that can cause a stroke if you have a medical condition called atrial fibrillation (a type of irregular heartbeat).  What do you need to know about xarelto ? Take your Xarelto ONCE DAILY at the same time every day with your evening meal. If you have difficulty swallowing the tablet whole, you may crush it and mix in applesauce just prior to taking your dose.  Take Xarelto exactly as prescribed by your doctor and DO NOT stop taking Xarelto without talking to the doctor who prescribed the medication.  Stopping without other stroke prevention medication to take the place of Xarelto may increase your risk of developing a clot that causes a stroke.  Refill your prescription before you run out.  After discharge, you should have regular check-up appointments with your healthcare provider that is prescribing your Xarelto.  In the future your dose may need to be changed if your kidney function or weight changes by a significant amount.  What do you do if you miss a dose? If you are taking Xarelto ONCE DAILY and you miss a dose, take it as soon as you remember on the same day then continue your regularly scheduled once daily regimen the next day. Do not take two doses of Xarelto at the same time or on the same day.   Important Safety Information A possible side effect of Xarelto is bleeding. You should call your healthcare provider right away if you experience any of the following: ? Bleeding from an injury or your nose that does not stop. ? Unusual colored urine (red or dark brown) or unusual colored stools (red or black). ? Unusual  bruising for unknown reasons. ? A serious fall or if you hit your head (even if there is no bleeding).  Some medicines may interact with Xarelto and might increase your risk of bleeding while on Xarelto. To help avoid this, consult your healthcare provider or pharmacist prior to using any new prescription or non-prescription medications, including herbals, vitamins, non-steroidal anti-inflammatory drugs (NSAIDs) and supplements.  This website has more information on Xarelto: [email or web address]-benson.com/.  _________________________________________________________________________________________________________________________________________  No driving for 4 days. No lifting over 5 lbs for 1 week. No sexual activity for 1 week.  Keep procedure site clean & dry. If you notice increased pain, swelling, bleeding or pus, call/return!  You may shower, but no soaking baths/hot tubs/pools for 1 week.

## 2014-09-19 NOTE — Progress Notes (Signed)
Discharge instructions given to patient. Verbalized understanding.

## 2014-09-19 NOTE — Procedures (Signed)
Pt is on her home CPAP resting well.

## 2014-09-21 ENCOUNTER — Telehealth: Payer: Self-pay | Admitting: Internal Medicine

## 2014-09-21 NOTE — Telephone Encounter (Signed)
New Message      Pt calling stating that Dr. Rayann Heman did an Ablation on Tuesday and she wants to know when she can shower and pt states she is coughing up a little bit of blood and is bleeding a little bit in her nose. Please call back and advise. Pt states that she hasn't had power since last night and her phone keeps going in and out, please try back again if you don't reach her after your first attempt.

## 2014-09-21 NOTE — Telephone Encounter (Signed)
This AM with her normal productive cough had streaks of blood in sputum and when she blew her nose.  Dr. Angelena Form advised to watch. Notify if blood saturates sputum and nasal discharge. Instructed patient as per Dr. Angelena Form advised

## 2014-09-21 NOTE — Telephone Encounter (Signed)
Called left message to call us back

## 2014-09-25 ENCOUNTER — Encounter: Payer: Self-pay | Admitting: Internal Medicine

## 2014-10-16 ENCOUNTER — Other Ambulatory Visit: Payer: Self-pay

## 2014-10-16 ENCOUNTER — Encounter (HOSPITAL_COMMUNITY): Payer: Self-pay | Admitting: Nurse Practitioner

## 2014-10-16 ENCOUNTER — Ambulatory Visit (HOSPITAL_COMMUNITY)
Admit: 2014-10-16 | Discharge: 2014-10-16 | Disposition: A | Discharge status: Home / Self Care | Payer: Medicare Other | Source: Ambulatory Visit | Attending: Nurse Practitioner | Admitting: Nurse Practitioner

## 2014-10-16 VITALS — BP 120/72 | HR 53 | Ht 64.0 in | Wt 200.0 lb

## 2014-10-16 DIAGNOSIS — E785 Hyperlipidemia, unspecified: Secondary | ICD-10-CM | POA: Insufficient documentation

## 2014-10-16 DIAGNOSIS — I10 Essential (primary) hypertension: Secondary | ICD-10-CM | POA: Diagnosis not present

## 2014-10-16 DIAGNOSIS — R001 Bradycardia, unspecified: Secondary | ICD-10-CM | POA: Insufficient documentation

## 2014-10-16 DIAGNOSIS — Z9889 Other specified postprocedural states: Secondary | ICD-10-CM | POA: Insufficient documentation

## 2014-10-16 DIAGNOSIS — G4733 Obstructive sleep apnea (adult) (pediatric): Secondary | ICD-10-CM | POA: Insufficient documentation

## 2014-10-16 DIAGNOSIS — J449 Chronic obstructive pulmonary disease, unspecified: Secondary | ICD-10-CM | POA: Diagnosis not present

## 2014-10-16 DIAGNOSIS — I481 Persistent atrial fibrillation: Secondary | ICD-10-CM | POA: Insufficient documentation

## 2014-10-16 DIAGNOSIS — Z79899 Other long term (current) drug therapy: Secondary | ICD-10-CM | POA: Diagnosis not present

## 2014-10-16 DIAGNOSIS — Z880 Allergy status to penicillin: Secondary | ICD-10-CM | POA: Insufficient documentation

## 2014-10-16 DIAGNOSIS — I4819 Other persistent atrial fibrillation: Secondary | ICD-10-CM

## 2014-10-16 NOTE — Progress Notes (Signed)
Patient ID: [name] [name], female   DOB: 1941-01-28, 74 y.o.   MRN: [id number]     Primary Care Physician: Monico Blitz, MD Referring Physician: Dr. [name] is a 74 y.o. female with a h/o afib ablation 6/21 for follow up in the afib clinic. She reports she has done exceptionally well since the procedure and maybe only noticed 10 mins of irregular heart beat since the procedure.  Denies any issues with rt groin or swallowing difficulties.  Today, she denies symptoms of palpitations, chest pain, shortness of breath, orthopnea, PND, lower extremity edema, dizziness, presyncope, syncope, or neurologic sequela. The patient is tolerating medications without difficulties and is otherwise without complaint today.   Past Medical History  Diagnosis Date  . Allergic rhinitis   . Paroxysmal atrial fibrillation     Element of tachycardia bradycardia syndrome  . Hyperlipidemia   . Essential hypertension, benign   . Anxiety   . Depression   . Lumbar disc disease   . OSA (obstructive sleep apnea)     uses CPAP  . Atrial flutter   . Obesity   . Respiratory failure     2 recent hospitalizations for hypoxic respiratory failure/ reactive airway disease  . COPD (chronic obstructive pulmonary disease)    Past Surgical History  Procedure Laterality Date  . Cataract extraction    . Lasik      Both eyes  . Total hip arthroplasty  2010    Right  . Anterior fusion lumbar spine    . Colonoscopy N/A 08/04/2012    Procedure: COLONOSCOPY;  Surgeon: Rogene Houston, MD;  [facility];  Service: Endoscopy;  Laterality: N/A;  730-rescheduled to [place] notified pt  . Cardioversion N/A 03/28/2014    Procedure: CARDIOVERSION;  Surgeon: Fay Records, MD;  Location: [facility];  Service: Cardiovascular;  Laterality: N/A;  . Electrophysiologic study N/A 09/18/2014    Procedure: Atrial Fibrillation Ablation;  Surgeon: Thompson Grayer, MD;  Location: [place] [facility];  Service:  Cardiovascular;  Laterality: N/A;  . Tee without cardioversion N/A 09/18/2014    Procedure: TRANSESOPHAGEAL ECHOCARDIOGRAM (TEE);  Surgeon: Larey Dresser, MD;  Location: [facility] ENDOSCOPY;  Service: Cardiovascular;  Laterality: N/A;    Current Outpatient Prescriptions  Medication Sig Dispense Refill  . antiseptic oral rinse (BIOTENE) LIQD 15 mLs by Mouth Rinse route as needed (Dry Mouth).     Marland Kitchen atorvastatin (LIPITOR) 10 MG tablet Take 10 mg by mouth daily.     . Biotin 1000 MCG tablet Take 5,000 mcg by mouth daily.     . budesonide (PULMICORT) 0.5 MG/2ML nebulizer solution Take 0.5 mg by nebulization 2 (two) times daily.     Marland Kitchen CALCIUM-MAGNESIUM-ZINC PO Take 1 capsule by mouth 2 (two) times daily.     . cromolyn (NASALCROM) 5.2 MG/ACT nasal spray Place 1 spray into both nostrils every 4 (four) hours as needed for allergies. Every 4-6 hours prn    . diltiazem (CARDIZEM) 120 MG tablet Take 0.5 tablets (60 mg total) by mouth 3 (three) times daily. 45 tablet 6  . diltiazem (CARDIZEM) 30 MG tablet Take 1 tablet (30 mg total) by mouth as needed (palpitations). 30 tablet 2  . docusate-casanthranol (PERICOLACE) 100-30 MG per capsule Take 1 capsule by mouth daily as needed for constipation.     . flecainide (TAMBOCOR) 100 MG tablet Take 1 tablet (100 mg total) by mouth 2 (two) times daily. 60 tablet 4  . fluticasone (FLONASE) 50  MCG/ACT nasal spray Place 2 sprays into the nose daily as needed for allergies. 16 g prn  . montelukast (SINGULAIR) 10 MG tablet Take 10 mg by mouth at bedtime.    Marland Kitchen omeprazole (PRILOSEC) 20 MG capsule Take 1 capsule by mouth daily.     . rivaroxaban (XARELTO) 20 MG TABS tablet Take 1 tablet (20 mg total) by mouth daily with supper. 30 tablet 11  . valsartan-hydrochlorothiazide (DIOVAN-HCT) 320-25 MG per tablet Take 1 tablet by mouth daily. 30 tablet 6  . erythromycin ethylsuccinate (EES) 400 MG tablet Take 800 mg by mouth once as needed (Take one hour prior to dental appts.).      No current facility-administered medications for this encounter.    Allergies  Allergen Reactions  . Bupropion Hcl Other (See Comments)    Suicidal Thoughts.   . Escitalopram Oxalate Other (See Comments)    Suicidal Thoughts.   . Fluticasone-Salmeterol Other (See Comments)    Caused patient to go into Afib.   Marland Kitchen Lexapro [Escitalopram Oxalate]   . Lisinopril   . Serevent Other (See Comments)    Caused patient to go into Afib  . Wellbutrin [Bupropion]   . Amoxicillin Rash  . Macrodantin [Nitrofurantoin] Rash  . Penicillins Rash    History   Social History  . Marital Status: Single    Spouse Name: N/A  . Number of Children: N/A  . Years of Education: N/A   Occupational History  . Retired: Presenter, broadcasting    Social History Main Topics  . Smoking status: Never Smoker   . Smokeless tobacco: Never Used  . Alcohol Use: No  . Drug Use: No  . Sexual Activity: Not on file   Other Topics Concern  . Not on file   Social History Narrative   Pt lives in [place] alone. She was never married.   Retired Customer service manager.   Attends [employer]    Family History  Problem Relation Age of Onset  . Cancer Mother   . Heart attack Father   . Colon cancer Other     ROS- All systems are reviewed and negative except as per the HPI above  Physical Exam: Filed Vitals:   10/16/14 0955  BP: 120/72  Pulse: 53  Height: 5\' 4"  (1.626 m)  Weight: 200 lb (90.719 kg)    GEN- The patient is well appearing, alert and oriented x 3 today.   Head- normocephalic, atraumatic Eyes-  Sclera clear, conjunctiva pink Ears- hearing intact Oropharynx- clear Neck- supple, no JVP Lymph- no cervical lymphadenopathy Lungs- Clear to ausculation bilaterally, normal work of breathing Heart- Regular rate and rhythm, no murmurs, rubs or gallops, PMI not laterally displaced GI- soft, NT, ND, + BS Extremities- no clubbing, cyanosis, or edema MS- no significant deformity or atrophy Skin-  no rash or lesion Psych- euthymic mood, full affect Neuro- strength and sensation are intact  EKG-SB at 53 bpm, LAD, septal infarct, age undetermined.  Assessment and Plan:  1. Persistent afib s/p ablation 6/21 Maintaining SR, doing well. Continue flecainide/ diltiazem Continue xarelto but may like to transition to coumadin after the 3 months recovery time from ablation due to cost. Will further discuss with Dr. Rayann Heman on f/u.  2. HTN Stable  3. Bradycardia Asymptomatic  F/u with Dr. Rayann Heman as scheduled 9/21

## 2014-10-17 ENCOUNTER — Telehealth: Payer: Self-pay | Admitting: Internal Medicine

## 2014-10-17 MED ORDER — AZELASTINE-FLUTICASONE 137-50 MCG/ACT NA SUSP: 2.0000 | Freq: Every day | NASAL | Status: DC

## 2014-10-17 NOTE — Telephone Encounter (Signed)
Pt requesting dymista samples.  Will pick up on Friday.  These have been placed up front.  Nothing further needed.

## 2014-10-31 ENCOUNTER — Other Ambulatory Visit (HOSPITAL_COMMUNITY): Payer: Self-pay | Admitting: Internal Medicine

## 2014-10-31 DIAGNOSIS — Z1231 Encounter for screening mammogram for malignant neoplasm of breast: Secondary | ICD-10-CM

## 2014-11-01 ENCOUNTER — Ambulatory Visit (HOSPITAL_COMMUNITY)
Admission: RE | Admit: 2014-11-01 | Discharge: 2014-11-01 | Disposition: A | Discharge status: Home / Self Care | Payer: Medicare Other | Source: Ambulatory Visit | Attending: Internal Medicine | Admitting: Internal Medicine

## 2014-11-01 DIAGNOSIS — Z1231 Encounter for screening mammogram for malignant neoplasm of breast: Secondary | ICD-10-CM | POA: Diagnosis not present

## 2014-12-16 ENCOUNTER — Other Ambulatory Visit: Payer: Self-pay | Admitting: Adult Health

## 2014-12-19 ENCOUNTER — Ambulatory Visit (INDEPENDENT_AMBULATORY_CARE_PROVIDER_SITE_OTHER): Payer: Medicare Other | Admitting: Internal Medicine

## 2014-12-19 ENCOUNTER — Encounter: Payer: Self-pay | Admitting: Internal Medicine

## 2014-12-19 VITALS — BP 126/50 | HR 46 | Ht 62.5 in | Wt 198.4 lb

## 2014-12-19 DIAGNOSIS — I480 Paroxysmal atrial fibrillation: Secondary | ICD-10-CM

## 2014-12-19 DIAGNOSIS — G4733 Obstructive sleep apnea (adult) (pediatric): Secondary | ICD-10-CM

## 2014-12-19 DIAGNOSIS — I10 Essential (primary) hypertension: Secondary | ICD-10-CM

## 2014-12-19 MED ORDER — WARFARIN SODIUM 5 MG PO TABS: 5.0000 mg | ORAL_TABLET | Freq: Every day | ORAL | Status: DC

## 2014-12-19 MED ORDER — FLECAINIDE ACETATE 50 MG PO TABS: 50.0000 mg | ORAL_TABLET | Freq: Two times a day (BID) | ORAL | Status: DC

## 2014-12-19 MED ORDER — DILTIAZEM HCL 120 MG PO TABS: 60.0000 mg | ORAL_TABLET | Freq: Two times a day (BID) | ORAL | Status: DC

## 2014-12-19 NOTE — Progress Notes (Signed)
PCP: Monico Blitz, MD Primary Cardiologist:  Dr Sherri Rad is a 74 y.o. female who presents today for routine electrophysiology followup.  Since her recent ablation, the patient reports doing very well.She denies procedure related complications and is pleased with her results.  She has had brief ERAF but is overall doing "much better".  Today, she denies symptoms of palpitations, chest pain, shortness of breath,  lower extremity edema, dizziness, presyncope, or syncope.  The patient is otherwise without complaint today.   Past Medical History  Diagnosis Date  . Allergic rhinitis   . Paroxysmal atrial fibrillation     Element of tachycardia bradycardia syndrome  . Hyperlipidemia   . Essential hypertension, benign   . Anxiety   . Depression   . Lumbar disc disease   . OSA (obstructive sleep apnea)     uses CPAP  . Atrial flutter   . Obesity   . Respiratory failure     2 recent hospitalizations for hypoxic respiratory failure/ reactive airway disease  . COPD (chronic obstructive pulmonary disease)    Past Surgical History  Procedure Laterality Date  . Cataract extraction    . Lasik      Both eyes  . Total hip arthroplasty  2010    Right  . Anterior fusion lumbar spine    . Colonoscopy N/A 08/04/2012    Procedure: COLONOSCOPY;  Surgeon: Rogene Houston, MD;  [facility];  Service: Endoscopy;  Laterality: N/A;  730-rescheduled to [name] notified pt  . Cardioversion N/A 03/28/2014    Procedure: CARDIOVERSION;  Surgeon: Fay Records, MD;  Location: [facility];  Service: Cardiovascular;  Laterality: N/A;  . Electrophysiologic study N/A 09/18/2014    Procedure: Atrial Fibrillation Ablation;  Surgeon: Thompson Grayer, MD;  Location: [place] [facility] LAB;  Service: Cardiovascular;  Laterality: N/A;  . Tee without cardioversion N/A 09/18/2014    Procedure: TRANSESOPHAGEAL ECHOCARDIOGRAM (TEE);  Surgeon: Larey Dresser, MD;  Location: [place];  Service:  Cardiovascular;  Laterality: N/A;    ROS- all systems are reviewed and negatives except as per HPI above  Current Outpatient Prescriptions  Medication Sig Dispense Refill  . antiseptic oral rinse (BIOTENE) LIQD 15 mLs by Mouth Rinse route daily as needed (Dry Mouth).     Marland Kitchen atorvastatin (LIPITOR) 10 MG tablet Take 10 mg by mouth daily.     . Azelastine-Fluticasone (DYMISTA) 137-50 MCG/ACT SUSP Place 2 puffs into the nose daily. 2 Bottle 0  . Azelastine-Fluticasone (DYMISTA) 137-50 MCG/ACT SUSP Place 1 spray into the nose at bedtime and may repeat dose one time if needed.    . Biotin 1000 MCG tablet Take 1,000 mcg by mouth daily.     . budesonide (PULMICORT) 0.5 MG/2ML nebulizer solution Take 0.5 mg by nebulization 2 (two) times daily.     Marland Kitchen CALCIUM-MAGNESIUM-ZINC PO Take 1 capsule by mouth 2 (two) times daily.     Marland Kitchen diltiazem (CARDIZEM) 120 MG tablet Take 0.5 tablets (60 mg total) by mouth 2 (two) times daily. 45 tablet 6  . diltiazem (CARDIZEM) 30 MG tablet Take 30 mg by mouth daily as needed (Palpitations).    . docusate-casanthranol (PERICOLACE) 100-30 MG per capsule Take 1 capsule by mouth daily as needed for constipation.     Marland Kitchen erythromycin ethylsuccinate (EES) 400 MG tablet Take 800 mg by mouth once as needed (Take one hour prior to dental appts.).    Marland Kitchen esomeprazole (NEXIUM) 20 MG capsule Take 20 mg by mouth  at bedtime and may repeat dose one time if needed.    . flecainide (TAMBOCOR) 50 MG tablet Take 1 tablet (50 mg total) by mouth 2 (two) times daily. 180 tablet 3  . montelukast (SINGULAIR) 10 MG tablet Take 10 mg by mouth at bedtime.    Marland Kitchen omeprazole (PRILOSEC) 20 MG capsule Take 1 capsule by mouth daily.     . valsartan-hydrochlorothiazide (DIOVAN-HCT) 320-25 MG per tablet Take 1 tablet by mouth daily. 30 tablet 6  . warfarin (COUMADIN) 5 MG tablet Take 1 tablet (5 mg total) by mouth daily. 45 tablet 3   No current facility-administered medications for this visit.    Physical  Exam: Filed Vitals:   12/19/14 0938  BP: 126/50  Pulse: 46  Height: 5' 2.5" (1.588 m)  Weight: 198 lb 6.4 oz (89.994 kg)    GEN- The patient is well appearing, alert and oriented x 3 today.   Head- normocephalic, atraumatic Eyes-  Sclera clear, conjunctiva pink Ears- hearing intact Oropharynx- clear Lungs- Clear to ausculation bilaterally, normal work of breathing Heart- Regular rate and rhythm, no murmurs, rubs or gallops, PMI not laterally displaced GI- soft, NT, ND, + BS Extremities- no clubbing, cyanosis, or edema  ekg today reveals sinus bradycardia 46 bpm, PR 168, QRS 98, septal infarct, LAD  Assessment and Plan:   1.  Paroxysmal atrial fibrillation and atrial flutter Doing well at this time Reduce flecainide to 50mg  BID Reduce cardizem to 60mg  BID Per her request, will switch back to coumadin from xarelto due to costs She is instructed to contact Dr Brigitte Pulse to arrange for an INR next week.  Will overlap xarelto/ coumadin for 48 hours rather than bridge as she has not had prior stroke.  2. Obesity Regular exercise and weight reduction are encouraged  3. OSA Compliance with CPAP is encouraged  4. HTN Stable No change required today  Return to see me in 3 months  Thompson Grayer MD, [facility] 12/19/2014 2:59 PM

## 2014-12-19 NOTE — Patient Instructions (Addendum)
Medication Instructions:  Your physician has recommended you make the following change in your medication:  1) Stop Xarelto--last dose on 12/21/14 2) Start Warfarin 5 mg daily ---take both Xarelto and Coumadin together for 2 days( INR's followed by Dr Benny Lennert have checked next week) 3) Decrease Cardizem 120mg  --take 1/2 tablet twice daily   Labwork: None ordered   Testing/Procedures: None ordered  Follow-Up: Your physician recommends that you schedule a follow-up appointment in: 3 months with Dr Rayann Heman   Any Other Special Instructions Will Be Listed Below (If Applicable).

## 2014-12-20 ENCOUNTER — Telehealth: Payer: Self-pay | Admitting: Internal Medicine

## 2014-12-20 NOTE — Telephone Encounter (Signed)
pt was in yesterday and meds were changes, she thought Flecainide was to be changed to 1/2 twice a day  -pls advise

## 2014-12-20 NOTE — Telephone Encounter (Signed)
Spoke with patient and she is aware Flecainide is 50 mg twice daily

## 2014-12-21 ENCOUNTER — Telehealth: Payer: Self-pay | Admitting: Internal Medicine

## 2014-12-21 NOTE — Telephone Encounter (Signed)
PT WANTS TO MAKE SURE THE RX WAS CALLED IN FOR FLECAINIDE 50 MG [facility] [phone]

## 2014-12-31 ENCOUNTER — Telehealth: Payer: Self-pay | Admitting: Internal Medicine

## 2014-12-31 NOTE — Telephone Encounter (Signed)
New message      Pt request a new presc for diltiazem 60mg  be called in to [facility].  The dosage has been changed.

## 2014-12-31 NOTE — Telephone Encounter (Signed)
The Cardizem she is on is a tablet 120mg  twice daily, Dr Rayann Heman has asked that she take 1/2 tablet twice daily  I explained to her that the 60mg  tablet that I can call in is a short acting medication not the long acting that she is currently on.   She is going to continue to cut her pills in half

## 2015-01-13 ENCOUNTER — Other Ambulatory Visit: Payer: Self-pay | Admitting: Adult Health

## 2015-01-15 ENCOUNTER — Other Ambulatory Visit: Payer: Self-pay

## 2015-01-15 MED ORDER — DILTIAZEM HCL 120 MG PO TABS: 60.0000 mg | ORAL_TABLET | Freq: Two times a day (BID) | ORAL | Status: DC

## 2015-01-28 ENCOUNTER — Encounter: Payer: Self-pay | Admitting: Cardiology

## 2015-01-28 ENCOUNTER — Ambulatory Visit (INDEPENDENT_AMBULATORY_CARE_PROVIDER_SITE_OTHER): Payer: Medicare Other | Admitting: Cardiology

## 2015-01-28 VITALS — BP 119/66 | HR 50

## 2015-01-28 DIAGNOSIS — I10 Essential (primary) hypertension: Secondary | ICD-10-CM

## 2015-01-28 DIAGNOSIS — G4733 Obstructive sleep apnea (adult) (pediatric): Secondary | ICD-10-CM | POA: Diagnosis not present

## 2015-01-28 DIAGNOSIS — J449 Chronic obstructive pulmonary disease, unspecified: Secondary | ICD-10-CM

## 2015-01-28 DIAGNOSIS — I480 Paroxysmal atrial fibrillation: Secondary | ICD-10-CM

## 2015-01-28 NOTE — Progress Notes (Signed)
Cardiology Office Note  Date: 01/28/2015   ID: [name] [name], DOB May 31, 1940, MRN [id number]  PCP: Monico Blitz, MD  Primary Cardiologist: Rozann Lesches, MD   Chief Complaint  Patient presents with  . PAF    History of Present Illness: [name] [name] is a 74 y.o. female last seen in April. Since that time she has been seen by Dr. Rayann Heman and Dr. Annamaria Boots - I reviewed the interval notes. She did undergo an atrial fibrillation ablation back in June. She continues on antiarrhythmic therapy and anticoagulation as outlined below, has had significant improvement in recurrent palpitations. Doses of flecainide and Cardizem CD have been cut in half since follow-up with Dr. Rayann Heman in September.  I reviewed her home vital sign checks, she keeps a meticulous record. She has had 3 brief episodes of presumed breakthrough atrial fibrillation, about once a week, lasting no more than 10 minutes. She correlates these specific times with having had a big evening meal late in the day. She is very satisfied with her symptom control.  Activity includes regular exercise, she goes to the [facility] to do pool aerobics. She also enjoys playing and teaching the dulcimer.  We reviewed her medications, she reports no obvious intolerances. Coumadin is followed by Dr. Manuella Ghazi, she does not indicate any bleeding problems.  Blood pressure is normal today, systolics have been in the 140s to 160s at home.  She reports compliance with CPAP at nighttime.   Past Medical History  Diagnosis Date  . Allergic rhinitis   . Paroxysmal atrial fibrillation (HCC)     Element of tachycardia bradycardia syndrome  . Hyperlipidemia   . Essential hypertension, benign   . Anxiety   . Depression   . Lumbar disc disease   . OSA (obstructive sleep apnea)     CPAP  . Atrial flutter ([place])   . Obesity   . Respiratory failure ([place])   . COPD (chronic obstructive pulmonary disease) [facility])     Past Surgical History  Procedure  Laterality Date  . Cataract extraction    . Lasik      Both eyes  . Total hip arthroplasty  2010    Right  . Anterior fusion lumbar spine    . Colonoscopy N/A 08/04/2012    Procedure: COLONOSCOPY;  Surgeon: Rogene Houston, MD;  [facility];  Service: Endoscopy;  Laterality: N/A;  730-rescheduled to [place] notified pt  . Cardioversion N/A 03/28/2014    Procedure: CARDIOVERSION;  Surgeon: Fay Records, MD;  Location[facility];  Service: Cardiovascular;  Laterality: N/A;  . Electrophysiologic study N/A 09/18/2014    Procedure: Atrial Fibrillation Ablation;  Surgeon: Thompson Grayer, MD;  Location: [place] CV LAB;  Service: Cardiovascular;  Laterality: N/A;  . Tee without cardioversion N/A 09/18/2014    Procedure: TRANSESOPHAGEAL ECHOCARDIOGRAM (TEE);  Surgeon: Larey Dresser, MD;  Location: [facility] ENDOSCOPY;  Service: Cardiovascular;  Laterality: N/A;    Current Outpatient Prescriptions  Medication Sig Dispense Refill  . antiseptic oral rinse (BIOTENE) LIQD 15 mLs by Mouth Rinse route daily as needed (Dry Mouth).     Marland Kitchen atorvastatin (LIPITOR) 10 MG tablet Take 10 mg by mouth daily.     . Azelastine-Fluticasone (DYMISTA) 137-50 MCG/ACT SUSP Place 2 puffs into the nose daily. 2 Bottle 0  . Azelastine-Fluticasone (DYMISTA) 137-50 MCG/ACT SUSP Place 1 spray into the nose at bedtime and may repeat dose one time if needed.    . Biotin 1000 MCG tablet Take  1,000 mcg by mouth daily.     . budesonide (PULMICORT) 0.5 MG/2ML nebulizer solution Take 0.5 mg by nebulization 2 (two) times daily.     Marland Kitchen CALCIUM-MAGNESIUM-ZINC PO Take 1 capsule by mouth 2 (two) times daily.     Marland Kitchen diltiazem (CARDIZEM) 120 MG tablet Take 0.5 tablets (60 mg total) by mouth 2 (two) times daily. 30 tablet 6  . diltiazem (CARDIZEM) 30 MG tablet Take 30 mg by mouth daily as needed (Palpitations).    . docusate-casanthranol (PERICOLACE) 100-30 MG per capsule Take 1 capsule by mouth daily as needed for constipation.     Marland Kitchen  erythromycin ethylsuccinate (EES) 400 MG tablet Take 800 mg by mouth once as needed (Take one hour prior to dental appts.).    Marland Kitchen esomeprazole (NEXIUM) 20 MG capsule Take 20 mg by mouth every evening.     . flecainide (TAMBOCOR) 50 MG tablet Take 1 tablet (50 mg total) by mouth 2 (two) times daily. 180 tablet 3  . montelukast (SINGULAIR) 10 MG tablet Take 10 mg by mouth at bedtime.    Marland Kitchen omeprazole (PRILOSEC) 20 MG capsule Take 1 capsule by mouth every morning.     . valsartan-hydrochlorothiazide (DIOVAN-HCT) 320-25 MG per tablet Take 1 tablet by mouth daily. 30 tablet 6  . warfarin (COUMADIN) 5 MG tablet Take 1 tablet (5 mg total) by mouth daily. 45 tablet 3   No current facility-administered medications for this visit.    Allergies:  Bupropion hcl; Escitalopram oxalate; Fluticasone-salmeterol; Lexapro; Lisinopril; Serevent; Wellbutrin; Amoxicillin; Macrodantin; and Penicillins   Social History: The patient  reports that she has never smoked. She has never used smokeless tobacco. She reports that she does not drink alcohol or use illicit drugs.   ROS:  Please see the history of present illness. Otherwise, complete review of systems is positive for arthritic pains, seasonal allergies.  All other systems are reviewed and negative.   Physical Exam: VS:  BP 119/66 mmHg  Pulse 50  SpO2 97%, BMI There is no weight on file to calculate BMI.  Wt Readings from Last 3 Encounters:  12/19/14 198 lb 6.4 oz (89.994 kg)  10/16/14 200 lb (90.719 kg)  09/18/14 199 lb (90.266 kg)     Overweight woman in no acute distress.  HEENT: Conjunctiva and lids normal, oropharynx clear.  Neck: Supple, no elevated JVP or bruits.  Lungs: Clear to auscultation, diminished, nonlabored.  Cardiac: RRR without gallop, distant heart sounds, no significant systolic murmur.  Abdomen: Soft, nontender, bowel sounds present.  Extremities: No pitting edema.  Skin: Warm and dry. Musculoskeletal: No  kyphosis. Neuropsychiatric: Alert and oriented 3, affect appropriate.   ECG: Tracing from 12/19/2014 showed sinus bradycardia at 46 bpm with leftward axis.   Recent Labwork: 03/26/2014: Hemoglobin 13.8; Platelets 261 09/19/2014: BUN 12; Creatinine, Ser 0.90; Potassium 3.7; Sodium 137   Other Studies Reviewed Today:  Transesophageal echocardiogram 09/18/2014: Study Conclusions  - Left ventricle: The cavity size was normal. Wall thickness was normal. Systolic function was normal. The estimated ejection fraction was in the range of 60% to 65%. Wall motion was normal; there were no regional wall motion abnormalities. - Aortic valve: There was no stenosis. - Aorta: Normal caliber, minimal plaque. - Mitral valve: There was mild regurgitation. - Left atrium: The atrium was moderately dilated. No evidence of thrombus in the atrial cavity or appendage. No evidence of thrombus in the atrial cavity or appendage. - Right ventricle: The cavity size was normal. Systolic function was normal. -  Right atrium: The atrium was moderately dilated. - Atrial septum: No defect or patent foramen ovale was identified. Echo contrast study showed no right-to-left atrial level shunt, at baseline or with provocation. - Tricuspid valve: Peak RV-RA gradient 33 mmHg.  Assessment and Plan:  1. Paroxysmal atrial fibrillation, now status post ablation with Dr. Rayann Heman. She continues on medical therapy including flecainide, Cardizem CD, and Coumadin. At this time she is satisfied with symptom control, has brief breakthrough episodes, some of which she correlates specifically with times a she has had a large evening meal. For now we will continue observation, no changes made.  2. Essential hypertension, blood pressure control is good today. Continue exercise and sodium restriction.  3. Obstructive sleep apnea, reinforced compliance with CPAP as it relates to heart rhythm management as well. Keep  follow-up with Dr. Annamaria Boots.  4. COPD with history of respiratory failure. No recent hospitalization.  Current medicines were reviewed with the patient today.  Disposition: FU with me in 6 months.   Signed, Satira Sark, MD, [facility] 01/28/2015 8:30 AM    [place] [facility] [place], [place], [place] [address] Phone: [phone]; Fax: [phone]

## 2015-01-28 NOTE — Patient Instructions (Signed)
Your physician recommends that you continue on your current medications as directed. Please refer to the Current Medication list given to you today. Your physician recommends that you schedule a follow-up appointment in: 6 months. You will receive a reminder letter in the mail in about 4 months reminding you to call and schedule your appointment. If you don't receive this letter, please contact our office. 

## 2015-02-05 ENCOUNTER — Telehealth: Payer: Self-pay | Admitting: Internal Medicine

## 2015-02-05 MED ORDER — AZELASTINE-FLUTICASONE 137-50 MCG/ACT NA SUSP: 2.0000 | Freq: Every day | NASAL | Status: DC

## 2015-02-05 NOTE — Telephone Encounter (Signed)
[phone] pt calling back

## 2015-02-05 NOTE — Telephone Encounter (Signed)
Called and spoke with pt Pt is requesting sample of dymista to be placed up front for her to pick up Informed pt that i would leave samples with front desk Pt voiced understanding  Nothing further is needed

## 2015-02-05 NOTE — Telephone Encounter (Signed)
Left message for pt to call back  °

## 2015-03-01 ENCOUNTER — Encounter: Payer: Self-pay | Admitting: Internal Medicine

## 2015-03-01 ENCOUNTER — Ambulatory Visit (INDEPENDENT_AMBULATORY_CARE_PROVIDER_SITE_OTHER): Payer: Medicare Other | Admitting: Internal Medicine

## 2015-03-01 VITALS — BP 122/68 | HR 59 | Ht 63.0 in | Wt 199.0 lb

## 2015-03-01 DIAGNOSIS — I480 Paroxysmal atrial fibrillation: Secondary | ICD-10-CM

## 2015-03-01 DIAGNOSIS — I10 Essential (primary) hypertension: Secondary | ICD-10-CM | POA: Diagnosis not present

## 2015-03-01 DIAGNOSIS — G4733 Obstructive sleep apnea (adult) (pediatric): Secondary | ICD-10-CM | POA: Diagnosis not present

## 2015-03-01 MED ORDER — FLECAINIDE ACETATE 50 MG PO TABS: 50.0000 mg | ORAL_TABLET | ORAL | Status: DC | PRN

## 2015-03-01 NOTE — Progress Notes (Signed)
PCP: Monico Blitz, MD Primary Cardiologist:  Dr Sherri Rad is a 74 y.o. female who presents today for routine electrophysiology followup.  Since her last visit, the patient reports doing very well.  She has occasional palpitations which she thinks may be afib.  These all last < 5 minutes. She feels that they are frequently brought on by GERD.  She has not seen a GI doctor.  Continues to have frequent reflux requiring her to sleep upright despite PPI.  Uses CPAP.   Today, she denies symptoms of palpitations, chest pain, shortness of breath,  lower extremity edema, dizziness, presyncope, or syncope.  The patient is otherwise without complaint today.   Past Medical History  Diagnosis Date  . Allergic rhinitis   . Paroxysmal atrial fibrillation (HCC)     Element of tachycardia bradycardia syndrome  . Hyperlipidemia   . Essential hypertension, benign   . Anxiety   . Depression   . Lumbar disc disease   . OSA (obstructive sleep apnea)     CPAP  . Atrial flutter ([place])   . Obesity   . Respiratory failure ([place])   . COPD (chronic obstructive pulmonary disease) [facility])    Past Surgical History  Procedure Laterality Date  . Cataract extraction    . Lasik      Both eyes  . Total hip arthroplasty  2010    Right  . Anterior fusion lumbar spine    . Colonoscopy N/A 08/04/2012    Procedure: COLONOSCOPY;  Surgeon: Rogene Houston, MD;  [facility];  Service: Endoscopy;  Laterality: N/A;  730-rescheduled to [place] notified pt  . Cardioversion N/A 03/28/2014    Procedure: CARDIOVERSION;  Surgeon: Fay Records, MD;  Location: [facility] ORS;  Service: Cardiovascular;  Laterality: N/A;  . Electrophysiologic study N/A 09/18/2014    Procedure: Atrial Fibrillation Ablation;  Surgeon: Thompson Grayer, MD;  Location: [place] CV LAB;  Service: Cardiovascular;  Laterality: N/A;  . Tee without cardioversion N/A 09/18/2014    Procedure: TRANSESOPHAGEAL ECHOCARDIOGRAM (TEE);  Surgeon: Larey Dresser, MD;  Location: [place];  Service: Cardiovascular;  Laterality: N/A;    ROS- all systems are reviewed and negatives except as per HPI above  Current Outpatient Prescriptions  Medication Sig Dispense Refill  . antiseptic oral rinse (BIOTENE) LIQD 15 mLs by Mouth Rinse route daily as needed (Dry Mouth).     Marland Kitchen atorvastatin (LIPITOR) 10 MG tablet Take 10 mg by mouth daily.     . Biotin 1000 MCG tablet Take 1,000 mcg by mouth daily.     . budesonide (PULMICORT) 0.5 MG/2ML nebulizer solution Take 0.5 mg by nebulization 2 (two) times daily.     Marland Kitchen CALCIUM-MAGNESIUM-ZINC PO Take 1 capsule by mouth 2 (two) times daily.     Marland Kitchen diltiazem (CARDIZEM) 120 MG tablet Take 0.5 tablets (60 mg total) by mouth 2 (two) times daily. 30 tablet 6  . diltiazem (CARDIZEM) 30 MG tablet Take 30 mg by mouth daily as needed (Palpitations).    . docusate-casanthranol (PERICOLACE) 100-30 MG per capsule Take 1 capsule by mouth daily as needed for constipation.     Marland Kitchen erythromycin ethylsuccinate (EES) 400 MG tablet Take 800 mg by mouth once as needed (Take one hour prior to dental appts.).    Marland Kitchen flecainide (TAMBOCOR) 50 MG tablet Take 1 tablet (50 mg total) by mouth 2 (two) times daily. 180 tablet 3  . montelukast (SINGULAIR) 10 MG tablet Take 10 mg by  mouth at bedtime.    Marland Kitchen omeprazole (PRILOSEC) 20 MG capsule Take 40 capsules by mouth every morning.     . valsartan-hydrochlorothiazide (DIOVAN-HCT) 320-25 MG per tablet Take 1 tablet by mouth daily. 30 tablet 6  . warfarin (COUMADIN) 5 MG tablet Take 1 tablet (5 mg total) by mouth daily. (Patient taking differently: Take 5 mg by mouth daily. Daily except Monday 2.5 mg) 45 tablet 3   No current facility-administered medications for this visit.    Physical Exam: Filed Vitals:   03/01/15 1026  BP: 122/68  Pulse: 59  Height: 5\' 3"  (1.6 m)  Weight: 199 lb (90.266 kg)  SpO2: 98%    GEN- The patient is well appearing, alert and oriented x 3 today.   Head-  normocephalic, atraumatic Eyes-  Sclera clear, conjunctiva pink Ears- hearing intact Oropharynx- clear Lungs- Clear to ausculation bilaterally, normal work of breathing Heart- Regular rate and rhythm, no murmurs, rubs or gallops, PMI not laterally displaced GI- soft, NT, ND, + BS Extremities- no clubbing, cyanosis, or edema  ekg today reveals sinus bradycardia with PVCs  Assessment and Plan: 1.  Paroxysmal atrial fibrillation and atrial flutter Doing well at this time Stop flecainide (can take prn afib) Continue cardizem to 60mg  BID Continue coumadin (switched back from xarelto previously due to cost concerns)  2. Obesity Regular exercise and weight reduction are encouraged  3. OSA Compliance with CPAP is encouraged  4. HTN Stable No change required today  5. GERD I worry that this is an ongoing issue despite PPI therapy.  I have encouraged her to discuss GI referral with Dr Manuella Ghazi.  Return to see me 3 months after her next visit with Dr Domenic Polite She has been given the number to the AF clinic and is instructed to contact them should she require more urgent AF attention.  Thompson Grayer MD, [facility] 03/01/2015 11:06 AM

## 2015-03-01 NOTE — Patient Instructions (Addendum)
Your physician has recommended you make the following change in your medication:  Stop flecainide 50 mg twice daily. You may take flecainide 50 mg as needed only. Continue all other medications the same. The number to the a-fib clinic is [phone].  Your physician recommends that you schedule a follow-up appointment in: with Dr. Rayann Heman in July 2017. You may schedule this appointment today.

## 2015-03-04 ENCOUNTER — Telehealth (HOSPITAL_COMMUNITY): Payer: Self-pay | Admitting: *Deleted

## 2015-03-04 NOTE — Telephone Encounter (Signed)
Patient states since coming off flecainide on Friday she has noticed when she goes into afib although they are still brief her BP jumps up to 160-180/70-80s and does not feel well. HR is in 80s with the afib.  Is usually able to revert back to NSR with coughing and her BP returns to normal once back in NSR.  Concerned of her bp going up with afib as never noticed this in the past.  Instructed her to monitor BP/HR over next few days and report back with log of vitals and will discuss with Roderic Palau NP.  Patient verbalized understanding.

## 2015-03-05 ENCOUNTER — Telehealth (HOSPITAL_COMMUNITY): Payer: Self-pay | Admitting: *Deleted

## 2015-03-05 NOTE — Telephone Encounter (Signed)
Pt called back and reports feeling better today and BP has seemed to have normalized since calling yesterday; instructed pt to keep log of BP/HR and if noticing any increasing trends of elevated BP or more frequency of afib to call back. Patient agreeable.

## 2015-03-08 ENCOUNTER — Encounter: Payer: Self-pay | Admitting: Internal Medicine

## 2015-04-11 DIAGNOSIS — M545 Low back pain: Secondary | ICD-10-CM | POA: Diagnosis not present

## 2015-04-11 DIAGNOSIS — M9903 Segmental and somatic dysfunction of lumbar region: Secondary | ICD-10-CM | POA: Diagnosis not present

## 2015-04-11 DIAGNOSIS — M542 Cervicalgia: Secondary | ICD-10-CM | POA: Diagnosis not present

## 2015-04-11 DIAGNOSIS — M9901 Segmental and somatic dysfunction of cervical region: Secondary | ICD-10-CM | POA: Diagnosis not present

## 2015-04-15 ENCOUNTER — Telehealth (HOSPITAL_COMMUNITY): Payer: Self-pay | Admitting: *Deleted

## 2015-04-15 NOTE — Telephone Encounter (Addendum)
Patient called in stating she has gone into afib and her usual method of bearing down will not convert her.  She states her HR is 115-130s BP 160-180/90-105. She took a 30mg  cardizem about 30 minutes ago and 100mg  of flecainide about 5 minutes ago but is concerned of what she should do.  Patient sounds short of breath over phone and very anxious. Denies extreme shortness of breath, chest pain, swelling or change in health recently.  She does report feeling nauseous since Thursday but is unsure of cause of this.  Encouraged patient to try and relax and give medication time to work also can take another cardizem. Will discuss with Roderic Palau NP with any further instructions.   Discussed with Roderic Palau encouraged pt to relax and give medications time to work. States BP/HR have come down 134/88 104.  Pt will call in morning with report of how she is doing. Given instructions on when she would need to report to ER. Pt verbalized understanding.

## 2015-04-22 ENCOUNTER — Telehealth (HOSPITAL_COMMUNITY): Payer: Self-pay | Admitting: *Deleted

## 2015-04-22 ENCOUNTER — Telehealth: Payer: Self-pay | Admitting: Cardiology

## 2015-04-22 ENCOUNTER — Ambulatory Visit (HOSPITAL_COMMUNITY)
Admission: RE | Admit: 2015-04-22 | Discharge: 2015-04-22 | Disposition: A | Discharge status: Home / Self Care | Payer: Medicare Other | Source: Ambulatory Visit | Attending: Nurse Practitioner | Admitting: Nurse Practitioner

## 2015-04-22 VITALS — BP 128/70 | HR 70 | Ht 63.0 in | Wt 199.0 lb

## 2015-04-22 DIAGNOSIS — J449 Chronic obstructive pulmonary disease, unspecified: Secondary | ICD-10-CM | POA: Insufficient documentation

## 2015-04-22 DIAGNOSIS — I10 Essential (primary) hypertension: Secondary | ICD-10-CM | POA: Insufficient documentation

## 2015-04-22 DIAGNOSIS — I480 Paroxysmal atrial fibrillation: Secondary | ICD-10-CM | POA: Diagnosis not present

## 2015-04-22 DIAGNOSIS — E669 Obesity, unspecified: Secondary | ICD-10-CM | POA: Insufficient documentation

## 2015-04-22 DIAGNOSIS — Z888 Allergy status to other drugs, medicaments and biological substances status: Secondary | ICD-10-CM | POA: Diagnosis not present

## 2015-04-22 DIAGNOSIS — Z6835 Body mass index (BMI) 35.0-35.9, adult: Secondary | ICD-10-CM | POA: Diagnosis not present

## 2015-04-22 DIAGNOSIS — E785 Hyperlipidemia, unspecified: Secondary | ICD-10-CM | POA: Diagnosis not present

## 2015-04-22 DIAGNOSIS — G4733 Obstructive sleep apnea (adult) (pediatric): Secondary | ICD-10-CM | POA: Diagnosis not present

## 2015-04-22 DIAGNOSIS — I4891 Unspecified atrial fibrillation: Secondary | ICD-10-CM | POA: Insufficient documentation

## 2015-04-22 DIAGNOSIS — Z8249 Family history of ischemic heart disease and other diseases of the circulatory system: Secondary | ICD-10-CM | POA: Diagnosis not present

## 2015-04-22 DIAGNOSIS — Z7901 Long term (current) use of anticoagulants: Secondary | ICD-10-CM | POA: Diagnosis not present

## 2015-04-22 DIAGNOSIS — Z880 Allergy status to penicillin: Secondary | ICD-10-CM | POA: Diagnosis not present

## 2015-04-22 DIAGNOSIS — Z79899 Other long term (current) drug therapy: Secondary | ICD-10-CM | POA: Diagnosis not present

## 2015-04-22 MED ORDER — FLECAINIDE ACETATE 50 MG PO TABS: 50.0000 mg | ORAL_TABLET | Freq: Two times a day (BID) | ORAL | Status: DC

## 2015-04-22 NOTE — Telephone Encounter (Signed)
[name] [name] calls today stating that yesterday she became shaking and she could tell that she was going in and out of Atrial Fib. States that she is going to [facility] and try to speak to the nurse. States that she is having a lot of indigestion also.

## 2015-04-22 NOTE — Telephone Encounter (Signed)
Pt has appt in [place] today at 2:30pm.

## 2015-04-22 NOTE — Patient Instructions (Signed)
Your physician has recommended you make the following change in your medication:  1)Flecainide 50mg twice a day  

## 2015-04-22 NOTE — Telephone Encounter (Signed)
Pt called in stating she has flipped back into afib again - just feels unsettled and anxious when this happens.  Says she has been flipping in and out all weekend but was able to terminate it with coughing over weekend but not today.  Offered appt to clinic at 230 today.

## 2015-04-23 ENCOUNTER — Encounter (HOSPITAL_COMMUNITY): Payer: Self-pay | Admitting: Nurse Practitioner

## 2015-04-23 NOTE — Progress Notes (Signed)
Patient ID: [name] [name], female   DOB: Aug 23, 1940, 75 y.o.   MRN: [id number]    Primary Care Physician: Monico Blitz, MD Referring Physician: Dr. [name] is a 75 y.o. female with a h/o afib, s/p ablation in 08/2014, her for evaluation in afib clinic for more frequent afib over the last few weeks. Prior to ablation, she had been on daily flecainide, but this was stopped due to maintaining  SR. She had been using pill in pocket flecainide as needed very infrequently  Until the last few weeks. She would like to discuss today going back on daily flecainide.  She continues to exercise 4 days a week, doing a combination of water aerobic's and treadmill. She has lost 10 lbs since last year and is keeping it of but has hit a plateau. She lives alone and eats out as a social activity and knows that she needs to decrease her calories further.  Today, she denies symptoms of palpitations, chest pain, shortness of breath, orthopnea, PND, lower extremity edema, dizziness, presyncope, syncope, or neurologic sequela. The patient is tolerating medications without difficulties and is otherwise without complaint today.   Past Medical History  Diagnosis Date  . Allergic rhinitis   . Paroxysmal atrial fibrillation (HCC)     Element of tachycardia bradycardia syndrome  . Hyperlipidemia   . Essential hypertension, benign   . Anxiety   . Depression   . Lumbar disc disease   . OSA (obstructive sleep apnea)     CPAP  . Atrial flutter ([place])   . Obesity   . Respiratory failure ([place])   . COPD (chronic obstructive pulmonary disease) [facility])    Past Surgical History  Procedure Laterality Date  . Cataract extraction    . Lasik      Both eyes  . Total hip arthroplasty  2010    Right  . Anterior fusion lumbar spine    . Colonoscopy N/A 08/04/2012    Procedure: COLONOSCOPY;  Surgeon: Rogene Houston, MD;  [facility];  Service: Endoscopy;  Laterality: N/A;  730-rescheduled to [place] notified pt  . Cardioversion N/A 03/28/2014    Procedure: CARDIOVERSION;  Surgeon: Fay Records, MD;  Location: [facility] ORS;  Service: Cardiovascular;  Laterality: N/A;  . Electrophysiologic study N/A 09/18/2014    Procedure: Atrial Fibrillation Ablation;  Surgeon: Thompson Grayer, MD;  Location: [place] CV LAB;  Service: Cardiovascular;  Laterality: N/A;  . Tee without cardioversion N/A 09/18/2014    Procedure: TRANSESOPHAGEAL ECHOCARDIOGRAM (TEE);  Surgeon: Larey Dresser, MD;  Location: [facility] ENDOSCOPY;  Service: Cardiovascular;  Laterality: N/A;    Current Outpatient Prescriptions  Medication Sig Dispense Refill  . antiseptic oral rinse (BIOTENE) LIQD 15 mLs by Mouth Rinse route daily as needed (Dry Mouth).     Marland Kitchen atorvastatin (LIPITOR) 10 MG tablet Take 10 mg by mouth daily.     . Biotin 1000 MCG tablet Take 1,000 mcg by mouth daily.     . budesonide (PULMICORT) 0.5 MG/2ML nebulizer solution Take 0.5 mg by nebulization 2 (two) times daily.     Marland Kitchen CALCIUM-MAGNESIUM-ZINC PO Take 1 capsule by mouth 2 (two) times daily.     Marland Kitchen diltiazem (CARDIZEM) 120 MG tablet Take 0.5 tablets (60 mg total) by mouth 2 (two) times daily. 30 tablet 6  . diltiazem (CARDIZEM) 30 MG tablet Take 30 mg by mouth daily as needed (Palpitations).    . docusate-casanthranol (PERICOLACE) 100-30 MG per capsule Take  1 capsule by mouth daily as needed for constipation.     Marland Kitchen erythromycin ethylsuccinate (EES) 400 MG tablet Take 800 mg by mouth once as needed (Take one hour prior to dental appts.).    Marland Kitchen esomeprazole (NEXIUM) 40 MG capsule Take 40 mg by mouth 2 (two) times daily.    . montelukast (SINGULAIR) 10 MG tablet Take 10 mg by mouth at bedtime.    . flecainide (TAMBOCOR) 50 MG tablet Take 1 tablet (50 mg total) by mouth 2 (two) times daily. 180 tablet 3  . valsartan-hydrochlorothiazide (DIOVAN-HCT) 320-25 MG per tablet Take 1 tablet by mouth daily. 30 tablet 6  . warfarin (COUMADIN) 5 MG tablet Take 1 tablet (5 mg total) by  mouth daily. (Patient taking differently: Take 5 mg by mouth daily. Daily except Monday 2.5 mg) 45 tablet 3   No current facility-administered medications for this encounter.    Allergies  Allergen Reactions  . Bupropion Hcl Other (See Comments)    Suicidal Thoughts.   . Escitalopram Oxalate Other (See Comments)    Suicidal Thoughts.   . Fluticasone-Salmeterol Other (See Comments)    Caused patient to go into Afib.   Marland Kitchen Lexapro [Escitalopram Oxalate]   . Lisinopril   . Serevent Other (See Comments)    Caused patient to go into Afib  . Wellbutrin [Bupropion]   . Amoxicillin Rash  . Macrodantin [Nitrofurantoin] Rash  . Penicillins Rash    Social History   Social History  . Marital Status: Single    Spouse Name: N/A  . Number of Children: N/A  . Years of Education: N/A   Occupational History  . Retired: Presenter, broadcasting    Social History Main Topics  . Smoking status: Never Smoker   . Smokeless tobacco: Never Used  . Alcohol Use: No  . Drug Use: No  . Sexual Activity: Not on file   Other Topics Concern  . Not on file   Social History Narrative   Pt lives in [place] alone. She was never married.   Retired Customer service manager.   Attends [employer]    Family History  Problem Relation Age of Onset  . Cancer Mother   . Heart attack Father   . Colon cancer Other     ROS- All systems are reviewed and negative except as per the HPI above  Physical Exam: Filed Vitals:   04/22/15 1452  BP: 128/70  Pulse: 70  Height: 5\' 3"  (1.6 m)  Weight: 199 lb (90.266 kg)    GEN- The patient is well appearing, alert and oriented x 3 today.   Head- normocephalic, atraumatic Eyes-  Sclera clear, conjunctiva pink Ears- hearing intact Oropharynx- clear Neck- supple, no JVP Lymph- no cervical lymphadenopathy Lungs- Clear to ausculation bilaterally, normal work of breathing Heart- Regular rate and rhythm, no murmurs, rubs or gallops, PMI not laterally displaced GI-  soft, NT, ND, + BS Extremities- no clubbing, cyanosis, or edema MS- no significant deformity or atrophy Skin- no rash or lesion Psych- euthymic mood, full affect Neuro- strength and sensation are intact  EKG-NSR, LAD, v rate 70 bpm, pr int 174 ms, QRS int 82 ms, qtc. 421 ms. Epic records reviewed.  Assessment and Plan: 1. PAF Resume flecainide 50 mg bid for more frequent episodes of afib Continue diltiazem Continue warfarin Continue exercise program Limit calorie intake  F/u one week for f/u ekg with resuming daily flecainide  Butch Penny C. Danira Nylander, [facility] [address]  [address] [place], [place] [address] [phone]

## 2015-04-24 ENCOUNTER — Other Ambulatory Visit (HOSPITAL_COMMUNITY): Payer: Self-pay | Admitting: *Deleted

## 2015-04-24 DIAGNOSIS — Z7951 Long term (current) use of inhaled steroids: Secondary | ICD-10-CM | POA: Diagnosis not present

## 2015-04-24 DIAGNOSIS — Z888 Allergy status to other drugs, medicaments and biological substances status: Secondary | ICD-10-CM | POA: Diagnosis not present

## 2015-04-24 DIAGNOSIS — Z471 Aftercare following joint replacement surgery: Secondary | ICD-10-CM | POA: Diagnosis not present

## 2015-04-24 DIAGNOSIS — Z8739 Personal history of other diseases of the musculoskeletal system and connective tissue: Secondary | ICD-10-CM | POA: Diagnosis not present

## 2015-04-24 DIAGNOSIS — Z09 Encounter for follow-up examination after completed treatment for conditions other than malignant neoplasm: Secondary | ICD-10-CM | POA: Diagnosis not present

## 2015-04-24 DIAGNOSIS — Z7901 Long term (current) use of anticoagulants: Secondary | ICD-10-CM | POA: Diagnosis not present

## 2015-04-24 DIAGNOSIS — Z880 Allergy status to penicillin: Secondary | ICD-10-CM | POA: Diagnosis not present

## 2015-04-24 DIAGNOSIS — Z96643 Presence of artificial hip joint, bilateral: Secondary | ICD-10-CM | POA: Diagnosis not present

## 2015-04-24 DIAGNOSIS — I4891 Unspecified atrial fibrillation: Secondary | ICD-10-CM | POA: Diagnosis not present

## 2015-04-24 DIAGNOSIS — J45909 Unspecified asthma, uncomplicated: Secondary | ICD-10-CM | POA: Diagnosis not present

## 2015-04-24 DIAGNOSIS — Z79899 Other long term (current) drug therapy: Secondary | ICD-10-CM | POA: Diagnosis not present

## 2015-04-24 DIAGNOSIS — I10 Essential (primary) hypertension: Secondary | ICD-10-CM | POA: Diagnosis not present

## 2015-04-24 DIAGNOSIS — M2011 Hallux valgus (acquired), right foot: Secondary | ICD-10-CM | POA: Diagnosis not present

## 2015-04-24 DIAGNOSIS — Z981 Arthrodesis status: Secondary | ICD-10-CM | POA: Diagnosis not present

## 2015-04-24 DIAGNOSIS — M533 Sacrococcygeal disorders, not elsewhere classified: Secondary | ICD-10-CM | POA: Diagnosis not present

## 2015-04-24 DIAGNOSIS — R1030 Lower abdominal pain, unspecified: Secondary | ICD-10-CM | POA: Diagnosis not present

## 2015-04-24 MED ORDER — DILTIAZEM HCL 30 MG PO TABS: ORAL_TABLET | ORAL | Status: DC

## 2015-04-26 DIAGNOSIS — M545 Low back pain: Secondary | ICD-10-CM | POA: Diagnosis not present

## 2015-04-26 DIAGNOSIS — M542 Cervicalgia: Secondary | ICD-10-CM | POA: Diagnosis not present

## 2015-04-26 DIAGNOSIS — M9901 Segmental and somatic dysfunction of cervical region: Secondary | ICD-10-CM | POA: Diagnosis not present

## 2015-04-26 DIAGNOSIS — M9903 Segmental and somatic dysfunction of lumbar region: Secondary | ICD-10-CM | POA: Diagnosis not present

## 2015-04-29 DIAGNOSIS — I4891 Unspecified atrial fibrillation: Secondary | ICD-10-CM | POA: Diagnosis not present

## 2015-04-29 DIAGNOSIS — Z6837 Body mass index (BMI) 37.0-37.9, adult: Secondary | ICD-10-CM | POA: Diagnosis not present

## 2015-04-29 DIAGNOSIS — M419 Scoliosis, unspecified: Secondary | ICD-10-CM | POA: Diagnosis not present

## 2015-04-29 DIAGNOSIS — Z789 Other specified health status: Secondary | ICD-10-CM | POA: Diagnosis not present

## 2015-04-29 DIAGNOSIS — K219 Gastro-esophageal reflux disease without esophagitis: Secondary | ICD-10-CM | POA: Diagnosis not present

## 2015-04-30 ENCOUNTER — Inpatient Hospital Stay (HOSPITAL_COMMUNITY): Admission: RE | Admit: 2015-04-30 | Payer: Medicare Other | Source: Ambulatory Visit | Admitting: Nurse Practitioner

## 2015-05-01 ENCOUNTER — Ambulatory Visit (INDEPENDENT_AMBULATORY_CARE_PROVIDER_SITE_OTHER): Payer: Medicare Other | Admitting: Internal Medicine

## 2015-05-01 ENCOUNTER — Encounter: Payer: Self-pay | Admitting: Internal Medicine

## 2015-05-01 ENCOUNTER — Ambulatory Visit (HOSPITAL_COMMUNITY)
Admission: RE | Admit: 2015-05-01 | Discharge: 2015-05-01 | Disposition: A | Discharge status: Home / Self Care | Payer: Medicare Other | Source: Ambulatory Visit | Attending: Nurse Practitioner | Admitting: Nurse Practitioner

## 2015-05-01 DIAGNOSIS — G4733 Obstructive sleep apnea (adult) (pediatric): Secondary | ICD-10-CM | POA: Diagnosis not present

## 2015-05-01 DIAGNOSIS — J3089 Other allergic rhinitis: Secondary | ICD-10-CM

## 2015-05-01 DIAGNOSIS — J302 Other seasonal allergic rhinitis: Secondary | ICD-10-CM

## 2015-05-01 DIAGNOSIS — I4891 Unspecified atrial fibrillation: Secondary | ICD-10-CM | POA: Insufficient documentation

## 2015-05-01 DIAGNOSIS — J309 Allergic rhinitis, unspecified: Secondary | ICD-10-CM | POA: Diagnosis not present

## 2015-05-01 DIAGNOSIS — I480 Paroxysmal atrial fibrillation: Secondary | ICD-10-CM | POA: Diagnosis not present

## 2015-05-01 DIAGNOSIS — I481 Persistent atrial fibrillation: Secondary | ICD-10-CM

## 2015-05-01 DIAGNOSIS — I4819 Other persistent atrial fibrillation: Secondary | ICD-10-CM

## 2015-05-01 NOTE — Patient Instructions (Signed)
We can continue CPAP 9/ [facility]  You can look in your CPAP booklet, or ask [facility] how to adjust the humidifier on your CPAP so that it increases the water vapor in the air you breathe.  Try skipping Dymista and Patanol for now, to reduce dryness in your eyes  Ok to add a saline nasal spray or saline nasal gel (otc) to help dryness in your mouth and nose.  Be sure to stay well hydrated during the daytime by drinking enough fluids.  Please call as needed

## 2015-05-01 NOTE — Assessment & Plan Note (Signed)
Quality of life is better with CPAP and compliance and control her good based on download. Plan-she will check her booklet for instructions on adjusting humidifier for comfort

## 2015-05-01 NOTE — Assessment & Plan Note (Signed)
She admits going out to eat is her only social life so she goes every day. Discussed ways to reduce calorie intake.

## 2015-05-01 NOTE — Progress Notes (Addendum)
Pt here for ekg to follow starting daily flecainide. Roderic Palau NP to review EKG  EKG done for pt going back on daily flecainide. Intervals WNL.

## 2015-05-01 NOTE — Assessment & Plan Note (Signed)
Ventricular response rate seems good today, managed by cardiology

## 2015-05-01 NOTE — Assessment & Plan Note (Signed)
-  She is describing dry eyes now Brother then allergic conjunctivitis. Plan-stop Pataday and Dymista. Check with eye doctor for any more specific therapies if OTC eye ointment and artificial tears aren't sufficient

## 2015-05-01 NOTE — Progress Notes (Signed)
05/06/11- 70 yoF never smoker followed for OSA, allergic rhinitis, complicated by AFib/coumadin LOV 05/05/10 She has had more persistent problems with nasal stuffiness and congestion and postnasal drip with global headache. This interferes with her ability to wear CPAP comfortably. She is using a fullface mask from [place] but she doesn't know her pressure. Ongoing problem with dryness of the eyes and nose. Since last here has had hip and back surgeries, treatment for atrial fibrillation with Coumadin.  10/04/12- 10 yoF never smoker followed for OSA, allergic rhinitis, complicated by AFib/coumadin FOLLOWS FOR: request new rx for flonase.  also c/o sinus congestion, PND, increased SOB, wheezing, throat congestion, prod cough in the AM with thick green mucus w/ the humidity CPAP 10/ [employer] Ran out of Flonase-increased nasal congestion. Albuterol causes tremor. AFib is controlled. Since last here had both hips replaced and lumbar spine surgery. No respiratory complications during surgeries.  11/09/13-  73 yoF never smoker followed for OSA, allergic rhinitis, complicated by AFib/coumadin FOLLOWS FOR:Dec 2014-had cough so bad and heart beating so fast(has Afib) 02-2013 through 04-2013; spent 5 days in [place] to help stop cough to correct heart. Another hospital stay in Jan2015 for cough again-Dr Koleen Nimrod [facility], [place]) saw her and did 3 IgE tests that show levels above 1800 and determined that allergies were the cause. Had allergy test at [facility];Dr Manuella Ghazi office to give injections(had reactions and stopped). [facility] told her to take antihistamines BID while on injections.   02/09/14- 73 yoF never smoker followed for OSA, allergic rhinitis, complicated by AFib/coumadin FOLLOWS FOR:Pt states she is dong pretty well overall; cough and wheezing(? GERD). Continues to wear CPAP 10/ [facility] every night (will need to order download and enroll in [place])   Has had flu and pneumonia vaccine by  her report Reports A. Fib 1 week ago. Today stood up from water fountain, felt weak and lightheaded on arrival. Pulse was 44. EKG shows sinus bradycardia. She brings a record of pulses, blood pressures and peak flow scores since mid October. Pulse rate usually 50-70. Peak flows usually 280-300. EKG- SB 44/ min on cardizem 1/2 x 120 mg TID, flecainide100 BID Breathing has been well controlled. Occasionally wakes with congestion and phlegm she blames on reflux. Rhinitis managed with Nasalcrom plus Flonase  09/04/14- 73 yoF never smoker followed for OSA, allergic rhinitis, complicated by AFib/coumadin Reports:CPAP10/ [facility] nightly, ~6-7 hours, pt. was getting afib everynight went from a small  to med. mask . Pt. is having dry mouth while wearing CPAP. Watery nasal discharge postnasal drip irritates throat. Nasal congestion noted as soon as she lies down. Also aware of reflux. We are reducing CPAP to 9  05/01/2015-75 year old female never smoker followed for OSA, allergic rhinitis, complicated by A. Fib/Coumadin CPAP 9/[facility] FOLLOWS FOR:  Wears CPAP nightly. Denies problems with mask/pressure. Recently received a new mask. Pt CPAP machine does not show AHI on downloads. DME: [facility]. Doing fine with CPAP and pressure seems appropriate. Download compliance looks very good. Cardiac ablation in April has failed and she continues flecainide. Complains of dry eyes question role of CPAP plus Dymista plus Pataday eyedrops. We discussed the medications. She does not know how to adjust her CPAP humidifier. Using Biotene.  ROS-see HPI Constitutional:   No-   weight loss, night sweats, fevers, chills, fatigue, lassitude. HEENT:   No-  headaches, difficulty swallowing, tooth/dental problems, sore throat,       No-  sneezing, itching,  +ear ache,  +nasal congestion, +post nasal drip,  CV:  No-   chest pain, orthopnea, PND, swelling in lower extremities, anasarca, dizziness, palpitations Resp: +  shortness of  breath with exertion or at rest.                productive cough,  No non-productive cough,  No- coughing up of blood.              No-change in color of mucus.  +No- wheezing.   Skin: No-   rash or lesions. GI:  No-   heartburn, indigestion, abdominal pain, nausea, vomiting, GU:  MS:  No-   joint pain or swelling. . Neuro-     nothing unusual Psych:  No- change in mood or affect. No depression or anxiety.  No memory loss.   OBJ- Physical Exam General- Alert, Oriented, Affect-appropriate, Distress- none acute, + overweight Skin- rash-none, lesions- none, excoriation- none Lymphadenopathy- none Head- atraumatic            Eyes- Gross vision intact, PERRLA, conjunctivae and secretions clear            Ears- Hearing, canals-normal            Nose- +turbinate edema, no-Septal dev, mucus, polyps, erosion, perforation             Throat- Mallampati III-IV , mucosa clear, not red , drainage- none, tonsils- atrophic Neck- flexible , trachea midline, no stridor , thyroid nl, carotid no bruit Chest - symmetrical excursion , unlabored           Heart/CV- RRR (no pacemaker) , no murmur , no gallop  , no rub, nl  s1 s2                  - JVD- none , edema- none, stasis changes- none, varices- none           Lung- clear to P&A, wheeze- none, cough- none , dullness-none, rub- none           Chest wall-  Abd- Br/ Gen/ Rectal- Not done, not indicated Extrem- cyanosis- none, clubbing, none, atrophy- none, strength- nl Neuro- grossly intact to observation

## 2015-05-07 DIAGNOSIS — H538 Other visual disturbances: Secondary | ICD-10-CM | POA: Diagnosis not present

## 2015-05-07 DIAGNOSIS — H04123 Dry eye syndrome of bilateral lacrimal glands: Secondary | ICD-10-CM | POA: Diagnosis not present

## 2015-05-07 DIAGNOSIS — Z961 Presence of intraocular lens: Secondary | ICD-10-CM | POA: Diagnosis not present

## 2015-05-15 ENCOUNTER — Telehealth: Payer: Self-pay | Admitting: Internal Medicine

## 2015-05-15 NOTE — Telephone Encounter (Signed)
Samples have been left up front for pick up. lmtcb x1 for pt.

## 2015-05-16 NOTE — Telephone Encounter (Signed)
Pt returning call and I let her know that samples were ready for her to pick up.[name] [name]

## 2015-05-27 DIAGNOSIS — I4891 Unspecified atrial fibrillation: Secondary | ICD-10-CM | POA: Diagnosis not present

## 2015-05-27 DIAGNOSIS — I10 Essential (primary) hypertension: Secondary | ICD-10-CM | POA: Diagnosis not present

## 2015-05-29 DIAGNOSIS — M545 Low back pain: Secondary | ICD-10-CM | POA: Diagnosis not present

## 2015-05-29 DIAGNOSIS — M9903 Segmental and somatic dysfunction of lumbar region: Secondary | ICD-10-CM | POA: Diagnosis not present

## 2015-05-29 DIAGNOSIS — M9901 Segmental and somatic dysfunction of cervical region: Secondary | ICD-10-CM | POA: Diagnosis not present

## 2015-05-29 DIAGNOSIS — M542 Cervicalgia: Secondary | ICD-10-CM | POA: Diagnosis not present

## 2015-06-06 ENCOUNTER — Telehealth: Payer: Self-pay | Admitting: Internal Medicine

## 2015-06-06 DIAGNOSIS — G4733 Obstructive sleep apnea (adult) (pediatric): Secondary | ICD-10-CM

## 2015-06-06 NOTE — Telephone Encounter (Signed)
Called and spoke to pt. Pt is requesting a new CPAP machine. Pt states she has an S9 Escape that she got from [place] on 3.21.2011. Pt last seen in 05/2015.   Dr. Annamaria Boots please advise. Thanks.

## 2015-06-07 NOTE — Telephone Encounter (Signed)
Order has been placed. Pt aware. Nothing further needed 

## 2015-06-07 NOTE — Telephone Encounter (Signed)
Ok to order DME [facility]- replacement of old CPAP machine, 9 cwp, mask of choice, humidifier, supplies, AirView    Dx OSA

## 2015-06-11 DIAGNOSIS — R05 Cough: Secondary | ICD-10-CM | POA: Diagnosis not present

## 2015-06-11 DIAGNOSIS — J069 Acute upper respiratory infection, unspecified: Secondary | ICD-10-CM | POA: Diagnosis not present

## 2015-06-11 DIAGNOSIS — I10 Essential (primary) hypertension: Secondary | ICD-10-CM | POA: Diagnosis not present

## 2015-06-11 DIAGNOSIS — J029 Acute pharyngitis, unspecified: Secondary | ICD-10-CM | POA: Diagnosis not present

## 2015-06-14 DIAGNOSIS — J449 Chronic obstructive pulmonary disease, unspecified: Secondary | ICD-10-CM | POA: Diagnosis not present

## 2015-06-14 DIAGNOSIS — I4891 Unspecified atrial fibrillation: Secondary | ICD-10-CM | POA: Diagnosis not present

## 2015-06-14 DIAGNOSIS — E7800 Pure hypercholesterolemia, unspecified: Secondary | ICD-10-CM | POA: Diagnosis not present

## 2015-06-14 DIAGNOSIS — Z789 Other specified health status: Secondary | ICD-10-CM | POA: Diagnosis not present

## 2015-06-14 DIAGNOSIS — J441 Chronic obstructive pulmonary disease with (acute) exacerbation: Secondary | ICD-10-CM | POA: Diagnosis not present

## 2015-06-18 DIAGNOSIS — J441 Chronic obstructive pulmonary disease with (acute) exacerbation: Secondary | ICD-10-CM | POA: Diagnosis not present

## 2015-06-21 DIAGNOSIS — J441 Chronic obstructive pulmonary disease with (acute) exacerbation: Secondary | ICD-10-CM | POA: Diagnosis not present

## 2015-06-21 DIAGNOSIS — R05 Cough: Secondary | ICD-10-CM | POA: Diagnosis not present

## 2015-06-24 DIAGNOSIS — J449 Chronic obstructive pulmonary disease, unspecified: Secondary | ICD-10-CM | POA: Diagnosis not present

## 2015-06-24 DIAGNOSIS — I4891 Unspecified atrial fibrillation: Secondary | ICD-10-CM | POA: Diagnosis not present

## 2015-06-24 DIAGNOSIS — J029 Acute pharyngitis, unspecified: Secondary | ICD-10-CM | POA: Diagnosis not present

## 2015-06-24 DIAGNOSIS — K219 Gastro-esophageal reflux disease without esophagitis: Secondary | ICD-10-CM | POA: Diagnosis not present

## 2015-07-05 DIAGNOSIS — M9901 Segmental and somatic dysfunction of cervical region: Secondary | ICD-10-CM | POA: Diagnosis not present

## 2015-07-05 DIAGNOSIS — M542 Cervicalgia: Secondary | ICD-10-CM | POA: Diagnosis not present

## 2015-07-05 DIAGNOSIS — M9903 Segmental and somatic dysfunction of lumbar region: Secondary | ICD-10-CM | POA: Diagnosis not present

## 2015-07-05 DIAGNOSIS — M545 Low back pain: Secondary | ICD-10-CM | POA: Diagnosis not present

## 2015-07-11 DIAGNOSIS — M542 Cervicalgia: Secondary | ICD-10-CM | POA: Diagnosis not present

## 2015-07-11 DIAGNOSIS — M9903 Segmental and somatic dysfunction of lumbar region: Secondary | ICD-10-CM | POA: Diagnosis not present

## 2015-07-11 DIAGNOSIS — M545 Low back pain: Secondary | ICD-10-CM | POA: Diagnosis not present

## 2015-07-11 DIAGNOSIS — M9901 Segmental and somatic dysfunction of cervical region: Secondary | ICD-10-CM | POA: Diagnosis not present

## 2015-07-15 DIAGNOSIS — M545 Low back pain: Secondary | ICD-10-CM | POA: Diagnosis not present

## 2015-07-15 DIAGNOSIS — M9903 Segmental and somatic dysfunction of lumbar region: Secondary | ICD-10-CM | POA: Diagnosis not present

## 2015-07-15 DIAGNOSIS — M9901 Segmental and somatic dysfunction of cervical region: Secondary | ICD-10-CM | POA: Diagnosis not present

## 2015-07-15 DIAGNOSIS — M79672 Pain in left foot: Secondary | ICD-10-CM | POA: Diagnosis not present

## 2015-07-15 DIAGNOSIS — M79675 Pain in left toe(s): Secondary | ICD-10-CM | POA: Diagnosis not present

## 2015-07-15 DIAGNOSIS — M542 Cervicalgia: Secondary | ICD-10-CM | POA: Diagnosis not present

## 2015-07-15 DIAGNOSIS — M2012 Hallux valgus (acquired), left foot: Secondary | ICD-10-CM | POA: Diagnosis not present

## 2015-07-15 DIAGNOSIS — M2042 Other hammer toe(s) (acquired), left foot: Secondary | ICD-10-CM | POA: Diagnosis not present

## 2015-07-17 DIAGNOSIS — J449 Chronic obstructive pulmonary disease, unspecified: Secondary | ICD-10-CM | POA: Diagnosis not present

## 2015-07-17 DIAGNOSIS — Z789 Other specified health status: Secondary | ICD-10-CM | POA: Diagnosis not present

## 2015-07-17 DIAGNOSIS — I4891 Unspecified atrial fibrillation: Secondary | ICD-10-CM | POA: Diagnosis not present

## 2015-07-17 DIAGNOSIS — J441 Chronic obstructive pulmonary disease with (acute) exacerbation: Secondary | ICD-10-CM | POA: Diagnosis not present

## 2015-07-19 DIAGNOSIS — M2041 Other hammer toe(s) (acquired), right foot: Secondary | ICD-10-CM | POA: Diagnosis not present

## 2015-07-19 DIAGNOSIS — M79672 Pain in left foot: Secondary | ICD-10-CM | POA: Diagnosis not present

## 2015-07-19 DIAGNOSIS — M2042 Other hammer toe(s) (acquired), left foot: Secondary | ICD-10-CM | POA: Diagnosis not present

## 2015-07-19 DIAGNOSIS — M79671 Pain in right foot: Secondary | ICD-10-CM | POA: Diagnosis not present

## 2015-07-19 DIAGNOSIS — M2011 Hallux valgus (acquired), right foot: Secondary | ICD-10-CM | POA: Diagnosis not present

## 2015-07-19 DIAGNOSIS — I10 Essential (primary) hypertension: Secondary | ICD-10-CM | POA: Diagnosis not present

## 2015-07-19 DIAGNOSIS — M2012 Hallux valgus (acquired), left foot: Secondary | ICD-10-CM | POA: Diagnosis not present

## 2015-07-19 DIAGNOSIS — E785 Hyperlipidemia, unspecified: Secondary | ICD-10-CM | POA: Diagnosis not present

## 2015-07-23 DIAGNOSIS — M9901 Segmental and somatic dysfunction of cervical region: Secondary | ICD-10-CM | POA: Diagnosis not present

## 2015-07-23 DIAGNOSIS — M9903 Segmental and somatic dysfunction of lumbar region: Secondary | ICD-10-CM | POA: Diagnosis not present

## 2015-07-23 DIAGNOSIS — M545 Low back pain: Secondary | ICD-10-CM | POA: Diagnosis not present

## 2015-07-23 DIAGNOSIS — M542 Cervicalgia: Secondary | ICD-10-CM | POA: Diagnosis not present

## 2015-07-25 ENCOUNTER — Ambulatory Visit (INDEPENDENT_AMBULATORY_CARE_PROVIDER_SITE_OTHER): Payer: Medicare Other | Admitting: Cardiology

## 2015-07-25 ENCOUNTER — Encounter: Payer: Self-pay | Admitting: Cardiology

## 2015-07-25 VITALS — BP 118/62 | HR 62 | Ht 63.0 in | Wt 199.0 lb

## 2015-07-25 DIAGNOSIS — I10 Essential (primary) hypertension: Secondary | ICD-10-CM

## 2015-07-25 DIAGNOSIS — I480 Paroxysmal atrial fibrillation: Secondary | ICD-10-CM | POA: Diagnosis not present

## 2015-07-25 NOTE — Progress Notes (Signed)
Cardiology Office Note  Date: 07/25/2015   ID: [name] [name], DOB 1940/11/13, MRN [id number]  PCP: Monico Blitz, MD  Primary Cardiologist: Rozann Lesches, MD   Chief Complaint  Patient presents with  . Atrial Fibrillation    History of Present Illness: [name] [name] is a 75 y.o. female last seen in October 2016. She presents for a routine follow-up visit. She has had no recent breakthrough episodes of PAF within the last few months. She continues to follow with Dr. Rayann Heman, was last seen in December 2016. At that time he stopped flecainide, to be used on an as-needed basis. She underwent atrial fibrillation ablation in June 2016. She was subsequently seen in the atrial fibrillation clinic with resumption of flecainide due to symptomatic PAF.  She has had a lot of trouble with shortness of breath, cough and chest congestion. States that she has given courses of antibiotics by Dr. Manuella Ghazi, has a pending visit with Dr. Annamaria Boots. Concern that these may be more allergic symptoms. She does use Singulair and breathing treatments, states that she cannot take antihistamines due to severe dry eyes.  Past Medical History  Diagnosis Date  . Allergic rhinitis   . Paroxysmal atrial fibrillation (HCC)     Element of tachycardia bradycardia syndrome  . Hyperlipidemia   . Essential hypertension, benign   . Anxiety   . Depression   . Lumbar disc disease   . OSA (obstructive sleep apnea)     CPAP  . Atrial flutter ([place])   . Obesity   . Respiratory failure ([place])   . COPD (chronic obstructive pulmonary disease) ([place])     Current Outpatient Prescriptions  Medication Sig Dispense Refill  . antiseptic oral rinse (BIOTENE) LIQD 15 mLs by Mouth Rinse route daily as needed (Dry Mouth).     Marland Kitchen atorvastatin (LIPITOR) 10 MG tablet Take 10 mg by mouth daily.     . Azelastine-Fluticasone (DYMISTA NA) Place 50 mcg into the nose.    . Biotin 1000 MCG tablet Take 1,000 mcg by mouth daily.     .  budesonide (PULMICORT) 0.5 MG/2ML nebulizer solution Take 0.5 mg by nebulization 2 (two) times daily.     Marland Kitchen CALCIUM-MAGNESIUM-ZINC PO Take 1 capsule by mouth 2 (two) times daily.     Marland Kitchen diltiazem (CARDIZEM) 120 MG tablet Take 0.5 tablets (60 mg total) by mouth 2 (two) times daily. 30 tablet 6  . diltiazem (CARDIZEM) 30 MG tablet Cardizem 30mg  -- take 1 tablet by mouth every 4 hours AS NEEDED for heart rate >100 as long as blood pressure >100. 45 tablet 3  . docusate-casanthranol (PERICOLACE) 100-30 MG per capsule Take 1 capsule by mouth daily as needed for constipation.     Marland Kitchen erythromycin ethylsuccinate (EES) 400 MG tablet Take 800 mg by mouth once as needed (Take one hour prior to dental appts.).    Marland Kitchen esomeprazole (NEXIUM) 40 MG capsule Take 40 mg by mouth 2 (two) times daily.    . flecainide (TAMBOCOR) 50 MG tablet Take 1 tablet (50 mg total) by mouth 2 (two) times daily. 180 tablet 3  . montelukast (SINGULAIR) 10 MG tablet Take 10 mg by mouth at bedtime.    . NON FORMULARY CPAP MACHINE    . umeclidinium-vilanterol (ANORO ELLIPTA) 62.5-25 MCG/INH AEPB Inhale 1 puff into the lungs daily.    . valsartan-hydrochlorothiazide (DIOVAN-HCT) 320-25 MG per tablet Take 1 tablet by mouth daily. 30 tablet 6  . warfarin (COUMADIN) 5 MG tablet Take 1  tablet (5 mg total) by mouth daily. (Patient taking differently: Take 5 mg by mouth daily. Daily except Monday 2.5 mg) 45 tablet 3   No current facility-administered medications for this visit.   Allergies:  Bupropion hcl; Escitalopram oxalate; Fluticasone-salmeterol; Lexapro; Lisinopril; Serevent; Serevent; Wellbutrin; Amoxicillin; Macrodantin; and Penicillins   Social History: The patient  reports that she has never smoked. She has never used smokeless tobacco. She reports that she does not drink alcohol or use illicit drugs.   ROS:  Please see the history of present illness. Otherwise, complete review of systems is positive for cough and chest congestion.  All  other systems are reviewed and negative.   Physical Exam: VS:  BP 118/62 mmHg  Pulse 62  Ht 5\' 3"  (1.6 m)  Wt 199 lb (90.266 kg)  BMI 35.26 kg/m2  SpO2 98%, BMI Body mass index is 35.26 kg/(m^2).  Wt Readings from Last 3 Encounters:  07/25/15 199 lb (90.266 kg)  05/01/15 199 lb (90.266 kg)  04/22/15 199 lb (90.266 kg)    Overweight woman in no acute distress.  HEENT: Conjunctiva and lids normal, oropharynx clear.  Neck: Supple, no elevated JVP or bruits.  Lungs: Coarse breath sounds without wheezing, diminished, nonlabored.  Cardiac: RRR without gallop, distant heart sounds, no significant systolic murmur.  Abdomen: Soft, nontender, bowel sounds present.  Extremities: No pitting edema.  Skin: Warm and dry.  ECG: I personally reviewed the prior tracing from 05/01/2015 which showed normal sinus rhythm with leftward axis, QRS duration 90 ms..  Recent Labwork: 09/19/2014: BUN 12; Creatinine, Ser 0.90; Potassium 3.7; Sodium 137   Other Studies Reviewed Today:  Transesophageal echocardiogram 09/18/2014: Study Conclusions  - Left ventricle: The cavity size was normal. Wall thickness was normal. Systolic function was normal. The estimated ejection fraction was in the range of 60% to 65%. Wall motion was normal; there were no regional wall motion abnormalities. - Aortic valve: There was no stenosis. - Aorta: Normal caliber, minimal plaque. - Mitral valve: There was mild regurgitation. - Left atrium: The atrium was moderately dilated. No evidence of thrombus in the atrial cavity or appendage. No evidence of thrombus in the atrial cavity or appendage. - Right ventricle: The cavity size was normal. Systolic function was normal. - Right atrium: The atrium was moderately dilated. - Atrial septum: No defect or patent foramen ovale was identified. Echo contrast study showed no right-to-left atrial level shunt, at baseline or with provocation. - Tricuspid valve:  Peak RV-RA gradient 33 mmHg.  Assessment and Plan:  1. Paroxysmal atrial fibrillation, symptomatic at well controlled on current regimen including Cardizem CD, flecainide, and Coumadin. She is status post atrial fibrillation ablation in June of last year as well.   2. Essential hypertension, blood pressure is well controlled today.  Current medicines were reviewed with the patient today.  Disposition: FU with me in 6 months.   Signed, Satira Sark, MD, [facility] 07/25/2015 9:11 AM    [facility] [place] [facility] [place], [place], [place] [address] Phone: [phone]; Fax: [phone]

## 2015-07-25 NOTE — Patient Instructions (Signed)
Your physician recommends that you continue on your current medications as directed. Please refer to the Current Medication list given to you today. Your physician recommends that you schedule a follow-up appointment in: 6 months. You will receive a reminder letter in the mail in about 4 months reminding you to call and schedule your appointment. If you don't receive this letter, please contact our office. 

## 2015-07-30 DIAGNOSIS — M2012 Hallux valgus (acquired), left foot: Secondary | ICD-10-CM | POA: Diagnosis not present

## 2015-07-30 DIAGNOSIS — M79675 Pain in left toe(s): Secondary | ICD-10-CM | POA: Diagnosis not present

## 2015-07-30 DIAGNOSIS — M79672 Pain in left foot: Secondary | ICD-10-CM | POA: Diagnosis not present

## 2015-07-30 DIAGNOSIS — M2042 Other hammer toe(s) (acquired), left foot: Secondary | ICD-10-CM | POA: Diagnosis not present

## 2015-07-31 ENCOUNTER — Encounter: Payer: Self-pay | Admitting: Internal Medicine

## 2015-07-31 DIAGNOSIS — H04123 Dry eye syndrome of bilateral lacrimal glands: Secondary | ICD-10-CM | POA: Diagnosis not present

## 2015-08-11 ENCOUNTER — Other Ambulatory Visit: Payer: Self-pay | Admitting: Internal Medicine

## 2015-08-12 NOTE — Telephone Encounter (Signed)
Rx request sent to pharmacy.  

## 2015-08-15 DIAGNOSIS — I4891 Unspecified atrial fibrillation: Secondary | ICD-10-CM | POA: Diagnosis not present

## 2015-08-15 DIAGNOSIS — G4733 Obstructive sleep apnea (adult) (pediatric): Secondary | ICD-10-CM | POA: Diagnosis not present

## 2015-08-15 DIAGNOSIS — Z789 Other specified health status: Secondary | ICD-10-CM | POA: Diagnosis not present

## 2015-08-15 DIAGNOSIS — J449 Chronic obstructive pulmonary disease, unspecified: Secondary | ICD-10-CM | POA: Diagnosis not present

## 2015-08-16 ENCOUNTER — Encounter: Payer: Self-pay | Admitting: Internal Medicine

## 2015-08-16 ENCOUNTER — Ambulatory Visit (INDEPENDENT_AMBULATORY_CARE_PROVIDER_SITE_OTHER): Payer: Medicare Other | Admitting: Internal Medicine

## 2015-08-16 ENCOUNTER — Other Ambulatory Visit (INDEPENDENT_AMBULATORY_CARE_PROVIDER_SITE_OTHER): Payer: Medicare Other

## 2015-08-16 ENCOUNTER — Ambulatory Visit (INDEPENDENT_AMBULATORY_CARE_PROVIDER_SITE_OTHER)
Admission: RE | Admit: 2015-08-16 | Discharge: 2015-08-16 | Disposition: A | Discharge status: Home / Self Care | Payer: Medicare Other | Source: Ambulatory Visit | Attending: Internal Medicine | Admitting: Internal Medicine

## 2015-08-16 VITALS — BP 124/60 | HR 58 | Ht 63.5 in | Wt 203.8 lb

## 2015-08-16 DIAGNOSIS — R0600 Dyspnea, unspecified: Secondary | ICD-10-CM

## 2015-08-16 DIAGNOSIS — G4733 Obstructive sleep apnea (adult) (pediatric): Secondary | ICD-10-CM

## 2015-08-16 DIAGNOSIS — J309 Allergic rhinitis, unspecified: Secondary | ICD-10-CM

## 2015-08-16 DIAGNOSIS — J4521 Mild intermittent asthma with (acute) exacerbation: Secondary | ICD-10-CM | POA: Diagnosis not present

## 2015-08-16 DIAGNOSIS — M9903 Segmental and somatic dysfunction of lumbar region: Secondary | ICD-10-CM | POA: Diagnosis not present

## 2015-08-16 DIAGNOSIS — M545 Low back pain: Secondary | ICD-10-CM | POA: Diagnosis not present

## 2015-08-16 DIAGNOSIS — M542 Cervicalgia: Secondary | ICD-10-CM | POA: Diagnosis not present

## 2015-08-16 DIAGNOSIS — M9901 Segmental and somatic dysfunction of cervical region: Secondary | ICD-10-CM | POA: Diagnosis not present

## 2015-08-16 LAB — CBC WITH DIFFERENTIAL/PLATELET
BASOS PCT: 0.8 % (ref 0.0–3.0)
Basophils Absolute: 0.1 10*3/uL (ref 0.0–0.1)
EOS PCT: 1.5 % (ref 0.0–5.0)
Eosinophils Absolute: 0.1 10*3/uL (ref 0.0–0.7)
HCT: 34.9 % — ABNORMAL LOW (ref 36.0–46.0)
HEMOGLOBIN: 11.6 g/dL — AB (ref 12.0–15.0)
Lymphocytes Relative: 16.3 % (ref 12.0–46.0)
Lymphs Abs: 1.5 10*3/uL (ref 0.7–4.0)
MCHC: 33.2 g/dL (ref 30.0–36.0)
MCV: 85.2 fl (ref 78.0–100.0)
MONO ABS: 1.2 10*3/uL — AB (ref 0.1–1.0)
Monocytes Relative: 12.7 % — ABNORMAL HIGH (ref 3.0–12.0)
Neutro Abs: 6.3 10*3/uL (ref 1.4–7.7)
Neutrophils Relative %: 68.7 % (ref 43.0–77.0)
Platelets: 198 10*3/uL (ref 150.0–400.0)
RBC: 4.09 Mil/uL (ref 3.87–5.11)
RDW: 14.8 % (ref 11.5–15.5)
WBC: 9.2 10*3/uL (ref 4.0–10.5)

## 2015-08-16 MED ORDER — LEVALBUTEROL HCL 0.63 MG/3ML IN NEBU
0.6300 mg | INHALATION_SOLUTION | Freq: Once | RESPIRATORY_TRACT | Status: AC
  Administered 2015-08-16: 0.63 mg via RESPIRATORY_TRACT

## 2015-08-16 MED ORDER — AZELASTINE-FLUTICASONE 137-50 MCG/ACT NA SUSP: 2.0000 | Freq: Every day | NASAL | Status: DC

## 2015-08-16 MED ORDER — GLYCOPYRROLATE-FORMOTEROL 9-4.8 MCG/ACT IN AERO: 2.0000 | INHALATION_SPRAY | Freq: Two times a day (BID) | RESPIRATORY_TRACT | Status: DC

## 2015-08-16 NOTE — Progress Notes (Signed)
05/06/11- 70 yoF never smoker followed for OSA, allergic rhinitis, complicated by AFib/coumadin LOV 05/05/10 She has had more persistent problems with nasal stuffiness and congestion and postnasal drip with global headache. This interferes with her ability to wear CPAP comfortably. She is using a fullface mask from [place] but she doesn't know her pressure. Ongoing problem with dryness of the eyes and nose. Since last here has had hip and back surgeries, treatment for atrial fibrillation with Coumadin.  10/04/12- 60 yoF never smoker followed for OSA, allergic rhinitis, complicated by AFib/coumadin FOLLOWS FOR: request new rx for flonase.  also c/o sinus congestion, PND, increased SOB, wheezing, throat congestion, prod cough in the AM with thick green mucus w/ the humidity CPAP 10/ [employer] Ran out of Flonase-increased nasal congestion. Albuterol causes tremor. AFib is controlled. Since last here had both hips replaced and lumbar spine surgery. No respiratory complications during surgeries.  11/09/13-  73 yoF never smoker followed for OSA, allergic rhinitis, complicated by AFib/coumadin FOLLOWS FOR:Dec 2014-had cough so bad and heart beating so fast(has Afib) 02-2013 through 04-2013; spent 5 days in [place] to help stop cough to correct heart. Another hospital stay in Jan2015 for cough again-Dr Koleen Nimrod [facility], [place]) saw her and did 3 IgE tests that show levels above 1800 and determined that allergies were the cause. Had allergy test at [facility];Dr Manuella Ghazi office to give injections(had reactions and stopped). [facility] told her to take antihistamines BID while on injections.   02/09/14- 73 yoF never smoker followed for OSA, allergic rhinitis, complicated by AFib/coumadin FOLLOWS FOR:Pt states she is dong pretty well overall; cough and wheezing(? GERD). Continues to wear CPAP 10/ Advanced every night (will need to order download and enroll in [place])   Has had flu and pneumonia vaccine by  her report Reports A. Fib 1 week ago. Today stood up from water fountain, felt weak and lightheaded on arrival. Pulse was 44. EKG shows sinus bradycardia. She brings a record of pulses, blood pressures and peak flow scores since mid October. Pulse rate usually 50-70. Peak flows usually 280-300. EKG- SB 44/ min on cardizem 1/2 x 120 mg TID, flecainide100 BID Breathing has been well controlled. Occasionally wakes with congestion and phlegm she blames on reflux. Rhinitis managed with Nasalcrom plus Flonase  09/04/14- 73 yoF never smoker followed for OSA, allergic rhinitis, complicated by AFib/coumadin Reports:CPAP10/ Advanced nightly, ~6-7 hours, pt. was getting afib everynight went from a small  to med. mask . Pt. is having dry mouth while wearing CPAP. Watery nasal discharge postnasal drip irritates throat. Nasal congestion noted as soon as she lies down. Also aware of reflux. We are reducing CPAP to 9  05/01/2015-75 year old female never smoker followed for OSA, allergic rhinitis, complicated by A. Fib/Coumadin CPAP 9/Advanced FOLLOWS FOR:  Wears CPAP nightly. Denies problems with mask/pressure. Recently received a new mask. Pt CPAP machine does not show AHI on downloads. DME: [facility]. Doing fine with CPAP and pressure seems appropriate. Download compliance looks very good. Cardiac ablation in April has failed and she continues flecainide. Complains of dry eyes question role of CPAP plus Dymista plus Pataday eyedrops. We discussed the medications. She does not know how to adjust her CPAP humidifier. Using Biotene.  08/16/2015-75 year old female never smoker followed for OSA, allergic rhinitis, complicated by A. Fib/Coumadin CPAP 9/Advanced FOLLOW FOR: doing well on CPAP. coughing, wheezing, SOB, tired, nose stuffy, tight in throat, upper respiratory infection.  Had URI mid-March treated with Cipro and prednisone. Residual productive cough white sputum,  morning or wheeze. Stays tired with easy dyspnea  on exertion noted while swimming. Has a "blockage" sensation upper throat Nebulizer helps CPAP download shows rate compliance and control  ROS-see HPI Constitutional:   No-   weight loss, night sweats, fevers, chills, fatigue, lassitude. HEENT:   No-  headaches, difficulty swallowing, tooth/dental problems, sore throat,       No-  sneezing, itching,  +ear ache,  +nasal congestion, +post nasal drip,  CV:  No-   chest pain, orthopnea, PND, swelling in lower extremities, anasarca, dizziness, palpitations Resp: +  shortness of breath with exertion or at rest.               + productive cough,  No non-productive cough,  No- coughing up of blood.              No-change in color of mucus.  No- wheezing.   Skin: No-   rash or lesions. GI:  No-   heartburn, indigestion, abdominal pain, nausea, vomiting, GU:  MS:  No-   joint pain or swelling. . Neuro-     nothing unusual Psych:  No- change in mood or affect. No depression or anxiety.  No memory loss.   OBJ- Physical Exam General- Alert, Oriented, Affect-appropriate, Distress- none acute, + overweight Skin- rash-none, lesions- none, excoriation- none Lymphadenopathy- none Head- atraumatic            Eyes- Gross vision intact, PERRLA, conjunctivae and secretions clear            Ears- Hearing, canals-normal            Nose- +turbinate edema, no-Septal dev, mucus, polyps, erosion, perforation             Throat- Mallampati III-IV , mucosa clear, not red , drainage- none, tonsils- atrophic Neck- flexible , trachea midline, no stridor , thyroid nl, carotid no bruit Chest - symmetrical excursion , unlabored           Heart/CV- RRR (no pacemaker) , no murmur , no gallop  , no rub, nl  s1 s2                  - JVD- none , edema- none, stasis changes- none, varices- none           Lung- clear to P&A, wheeze- none, cough +one paroxysmal harsh wheezy cough, for which we gave Neb treatment                        dullness-none, rub- none           Chest  wall-  Abd- Br/ Gen/ Rectal- Not done, not indicated Extrem- cyanosis- none, clubbing, none, atrophy- none, strength- nl Neuro- grossly intact to observation

## 2015-08-16 NOTE — Patient Instructions (Addendum)
We can continue CPAP 9/ Advanced  Neb xop 0.63  Sample Bevespi inhaler     inhale 2 puffs, twice daily   Try this instead of Anoro for comparison  Order- CXR  Dx dyspnea              Lab- CBC w diff, Allergy profile    Dx allergic rhinitis  Try going back to Flonase 1-2 puffs each nostril twice daily while needed, instead of Dymista for comparison.           Ok to give sample Dymista  Keep appointment August 3, but call earlier if needed

## 2015-08-16 NOTE — Progress Notes (Signed)
Patient ID: [name] [name], female   DOB: 11/08/1940, 75 y.o.   MRN: [id number]  Patient seen in the office today and instructed on use of Bevespi.  Patient expressed understanding and demonstrated technique.

## 2015-08-18 NOTE — Assessment & Plan Note (Signed)
Exacerbation of asthmatic bronchitis following respiratory infection in March Plan-given Xopenex neb treatment today. Try sample Bevespi instead of Anoro, CXR

## 2015-08-18 NOTE — Assessment & Plan Note (Signed)
Good compliance and control demonstrated with CPAP 9//Advanced

## 2015-08-19 ENCOUNTER — Telehealth: Payer: Self-pay | Admitting: Internal Medicine

## 2015-08-19 LAB — RESPIRATORY ALLERGY PROFILE REGION II ~~LOC~~
ALLERGEN, D PTERNOYSSINUS, D1: 2.33 kU/L — AB
ALLERGEN, MULBERRY, T70: 8.13 kU/L — AB
ALTERNARIA ALTERNATA: 0.26 kU/L — AB
Allergen, Cedar tree, t12: 42 kU/L — ABNORMAL HIGH
Allergen, Comm Silver Birch, t9: 41.8 kU/L — ABNORMAL HIGH
Allergen, Cottonwood, t14: 55.6 kU/L — ABNORMAL HIGH
Allergen, Mouse Urine Protein, e78: 0.19 kU/L — ABNORMAL HIGH
Allergen, Oak,t7: 14.8 kU/L — ABNORMAL HIGH
Aspergillus fumigatus, m3: 1.25 kU/L — ABNORMAL HIGH
BOX ELDER: 62.1 kU/L — AB
Bermuda Grass: 60.8 kU/L — ABNORMAL HIGH
CLADOSPORIUM HERBARUM: 1.21 kU/L — AB
COCKROACH: 10 kU/L — AB
Cat Dander: 5.87 kU/L — ABNORMAL HIGH
Common Ragweed: 42.4 kU/L — ABNORMAL HIGH
D. FARINAE: 12.5 kU/L — AB
DOG DANDER: 8.41 kU/L — AB
ELM IGE: 68.1 kU/L — AB
IgE (Immunoglobulin E), Serum: 2372 kU/L — ABNORMAL HIGH (ref <115)
JOHNSON GRASS: 64.6 kU/L — AB
PECAN/HICKORY TREE IGE: 54 kU/L — AB
Penicillium Notatum: 0.71 kU/L — ABNORMAL HIGH
Rough Pigweed  IgE: 58.9 kU/L — ABNORMAL HIGH
SHEEP SORREL IGE: 52.6 kU/L — AB
TIMOTHY GRASS: 64.1 kU/L — AB

## 2015-08-19 NOTE — Telephone Encounter (Signed)
Patient notified of CXR results. Nothing further needed.  

## 2015-08-21 DIAGNOSIS — M545 Low back pain: Secondary | ICD-10-CM | POA: Diagnosis not present

## 2015-08-21 DIAGNOSIS — Z299 Encounter for prophylactic measures, unspecified: Secondary | ICD-10-CM | POA: Diagnosis not present

## 2015-08-21 DIAGNOSIS — M9903 Segmental and somatic dysfunction of lumbar region: Secondary | ICD-10-CM | POA: Diagnosis not present

## 2015-08-21 DIAGNOSIS — M9901 Segmental and somatic dysfunction of cervical region: Secondary | ICD-10-CM | POA: Diagnosis not present

## 2015-08-21 DIAGNOSIS — M542 Cervicalgia: Secondary | ICD-10-CM | POA: Diagnosis not present

## 2015-08-21 DIAGNOSIS — J441 Chronic obstructive pulmonary disease with (acute) exacerbation: Secondary | ICD-10-CM | POA: Diagnosis not present

## 2015-08-22 ENCOUNTER — Encounter: Payer: Self-pay | Admitting: Internal Medicine

## 2015-08-28 ENCOUNTER — Telehealth: Payer: Self-pay | Admitting: Internal Medicine

## 2015-08-28 MED ORDER — IPRATROPIUM BROMIDE 0.02 % IN SOLN: 0.5000 mg | Freq: Four times a day (QID) | RESPIRATORY_TRACT | Status: DC

## 2015-08-28 NOTE — Telephone Encounter (Signed)
Offer ipratropium (Atrovent) neb solution, # 25,  1 neb every 6 hours as needed, ref x 12

## 2015-08-28 NOTE — Telephone Encounter (Signed)
Pt is aware of medication change. Rx has been sent in. Nothing further was needed. 

## 2015-08-28 NOTE — Telephone Encounter (Signed)
Pt states she has stopped using Duoneb due to having Afib episodes after using it as well as shakes. Pt is aware that I have added Albuterol to her Allergy/Intolerance list as well as removed Duoneb from her medication list Will forward to CY to make him aware and advise if patient needs to have Duoneb replaced; pt currently using Pulmicort neb solution only.     CY Please advise. Thanks.

## 2015-09-04 ENCOUNTER — Ambulatory Visit (INDEPENDENT_AMBULATORY_CARE_PROVIDER_SITE_OTHER): Payer: Medicare Other | Admitting: *Deleted

## 2015-09-04 ENCOUNTER — Telehealth: Payer: Self-pay | Admitting: Cardiology

## 2015-09-04 DIAGNOSIS — I480 Paroxysmal atrial fibrillation: Secondary | ICD-10-CM

## 2015-09-04 NOTE — Telephone Encounter (Signed)
Patient called stating that she feels like she is in A-Fib. States that she called A -Fib clinic and was told to come to office for an EKG

## 2015-09-04 NOTE — Progress Notes (Signed)
Patient contacted office today because she felt she was in a-fib due to her head hurting and it felt like her ears were popping. No c/o dizziness, chest pain or increased sob. EKG done in office today since patient request. Patient said she called the a-fib clinic and was told to contact our office to do an EKG.

## 2015-09-04 NOTE — Telephone Encounter (Signed)
Patient came to office today for EKG and was in sinus rhythm.

## 2015-09-12 DIAGNOSIS — I10 Essential (primary) hypertension: Secondary | ICD-10-CM | POA: Diagnosis not present

## 2015-09-12 DIAGNOSIS — J441 Chronic obstructive pulmonary disease with (acute) exacerbation: Secondary | ICD-10-CM | POA: Diagnosis not present

## 2015-09-12 DIAGNOSIS — I4891 Unspecified atrial fibrillation: Secondary | ICD-10-CM | POA: Diagnosis not present

## 2015-09-16 DIAGNOSIS — M542 Cervicalgia: Secondary | ICD-10-CM | POA: Diagnosis not present

## 2015-09-16 DIAGNOSIS — M9903 Segmental and somatic dysfunction of lumbar region: Secondary | ICD-10-CM | POA: Diagnosis not present

## 2015-09-16 DIAGNOSIS — M9901 Segmental and somatic dysfunction of cervical region: Secondary | ICD-10-CM | POA: Diagnosis not present

## 2015-09-16 DIAGNOSIS — M545 Low back pain: Secondary | ICD-10-CM | POA: Diagnosis not present

## 2015-09-24 DIAGNOSIS — M542 Cervicalgia: Secondary | ICD-10-CM | POA: Diagnosis not present

## 2015-09-24 DIAGNOSIS — M9901 Segmental and somatic dysfunction of cervical region: Secondary | ICD-10-CM | POA: Diagnosis not present

## 2015-09-24 DIAGNOSIS — M9903 Segmental and somatic dysfunction of lumbar region: Secondary | ICD-10-CM | POA: Diagnosis not present

## 2015-09-24 DIAGNOSIS — M545 Low back pain: Secondary | ICD-10-CM | POA: Diagnosis not present

## 2015-09-26 DIAGNOSIS — M2011 Hallux valgus (acquired), right foot: Secondary | ICD-10-CM | POA: Diagnosis not present

## 2015-09-26 DIAGNOSIS — M85872 Other specified disorders of bone density and structure, left ankle and foot: Secondary | ICD-10-CM | POA: Diagnosis not present

## 2015-09-26 DIAGNOSIS — M85871 Other specified disorders of bone density and structure, right ankle and foot: Secondary | ICD-10-CM | POA: Diagnosis not present

## 2015-09-26 DIAGNOSIS — M2012 Hallux valgus (acquired), left foot: Secondary | ICD-10-CM | POA: Diagnosis not present

## 2015-09-26 DIAGNOSIS — M2042 Other hammer toe(s) (acquired), left foot: Secondary | ICD-10-CM | POA: Diagnosis not present

## 2015-09-26 DIAGNOSIS — M19072 Primary osteoarthritis, left ankle and foot: Secondary | ICD-10-CM | POA: Diagnosis not present

## 2015-09-26 DIAGNOSIS — M19071 Primary osteoarthritis, right ankle and foot: Secondary | ICD-10-CM | POA: Diagnosis not present

## 2015-09-27 DIAGNOSIS — M2042 Other hammer toe(s) (acquired), left foot: Secondary | ICD-10-CM | POA: Insufficient documentation

## 2015-09-30 ENCOUNTER — Telehealth: Payer: Self-pay | Admitting: Internal Medicine

## 2015-09-30 NOTE — Telephone Encounter (Signed)
Spoke with patient, states that she is having foot surgery and is going to be off her feet for about 6-8 weeks. Pt is requesting samples of Bevepsi and Dymista to help with cost and her not being able to leave the house. Samples placed up front. Nothing further needed.

## 2015-10-04 ENCOUNTER — Ambulatory Visit: Payer: Medicare Other | Admitting: Internal Medicine

## 2015-10-06 ENCOUNTER — Other Ambulatory Visit: Payer: Self-pay | Admitting: Internal Medicine

## 2015-10-07 DIAGNOSIS — M542 Cervicalgia: Secondary | ICD-10-CM | POA: Diagnosis not present

## 2015-10-07 DIAGNOSIS — M9902 Segmental and somatic dysfunction of thoracic region: Secondary | ICD-10-CM | POA: Diagnosis not present

## 2015-10-07 DIAGNOSIS — M9903 Segmental and somatic dysfunction of lumbar region: Secondary | ICD-10-CM | POA: Diagnosis not present

## 2015-10-07 DIAGNOSIS — M545 Low back pain: Secondary | ICD-10-CM | POA: Diagnosis not present

## 2015-10-07 DIAGNOSIS — M9901 Segmental and somatic dysfunction of cervical region: Secondary | ICD-10-CM | POA: Diagnosis not present

## 2015-10-07 DIAGNOSIS — M546 Pain in thoracic spine: Secondary | ICD-10-CM | POA: Diagnosis not present

## 2015-10-10 DIAGNOSIS — M2011 Hallux valgus (acquired), right foot: Secondary | ICD-10-CM | POA: Diagnosis not present

## 2015-10-10 DIAGNOSIS — J45909 Unspecified asthma, uncomplicated: Secondary | ICD-10-CM | POA: Diagnosis not present

## 2015-10-10 DIAGNOSIS — Z888 Allergy status to other drugs, medicaments and biological substances status: Secondary | ICD-10-CM | POA: Diagnosis not present

## 2015-10-10 DIAGNOSIS — Z79899 Other long term (current) drug therapy: Secondary | ICD-10-CM | POA: Diagnosis not present

## 2015-10-10 DIAGNOSIS — Z7951 Long term (current) use of inhaled steroids: Secondary | ICD-10-CM | POA: Diagnosis not present

## 2015-10-10 DIAGNOSIS — Z01818 Encounter for other preprocedural examination: Secondary | ICD-10-CM | POA: Diagnosis not present

## 2015-10-10 DIAGNOSIS — I10 Essential (primary) hypertension: Secondary | ICD-10-CM | POA: Diagnosis not present

## 2015-10-10 DIAGNOSIS — I480 Paroxysmal atrial fibrillation: Secondary | ICD-10-CM | POA: Diagnosis not present

## 2015-10-10 DIAGNOSIS — M2042 Other hammer toe(s) (acquired), left foot: Secondary | ICD-10-CM | POA: Diagnosis not present

## 2015-10-10 DIAGNOSIS — Z880 Allergy status to penicillin: Secondary | ICD-10-CM | POA: Diagnosis not present

## 2015-10-10 DIAGNOSIS — Z9889 Other specified postprocedural states: Secondary | ICD-10-CM | POA: Diagnosis not present

## 2015-10-10 DIAGNOSIS — Z7901 Long term (current) use of anticoagulants: Secondary | ICD-10-CM | POA: Diagnosis not present

## 2015-10-10 DIAGNOSIS — Z881 Allergy status to other antibiotic agents status: Secondary | ICD-10-CM | POA: Diagnosis not present

## 2015-10-11 ENCOUNTER — Encounter: Payer: Self-pay | Admitting: Internal Medicine

## 2015-10-11 ENCOUNTER — Ambulatory Visit (INDEPENDENT_AMBULATORY_CARE_PROVIDER_SITE_OTHER): Payer: Medicare Other | Admitting: Internal Medicine

## 2015-10-11 VITALS — BP 100/58 | HR 60 | Ht 63.0 in | Wt 207.0 lb

## 2015-10-11 DIAGNOSIS — I10 Essential (primary) hypertension: Secondary | ICD-10-CM

## 2015-10-11 DIAGNOSIS — I480 Paroxysmal atrial fibrillation: Secondary | ICD-10-CM | POA: Diagnosis not present

## 2015-10-11 DIAGNOSIS — J449 Chronic obstructive pulmonary disease, unspecified: Secondary | ICD-10-CM | POA: Diagnosis not present

## 2015-10-11 DIAGNOSIS — E7800 Pure hypercholesterolemia, unspecified: Secondary | ICD-10-CM | POA: Diagnosis not present

## 2015-10-11 DIAGNOSIS — Z299 Encounter for prophylactic measures, unspecified: Secondary | ICD-10-CM | POA: Diagnosis not present

## 2015-10-11 DIAGNOSIS — I4891 Unspecified atrial fibrillation: Secondary | ICD-10-CM | POA: Diagnosis not present

## 2015-10-11 NOTE — Patient Instructions (Signed)
Medication Instructions:  Your physician recommends that you continue on your current medications as directed. Please refer to the Current Medication list given to you today.  Labwork: NONE  Testing/Procedures: NONE  Follow-Up: Your physician recommends that you schedule a follow-up appointment in: 6 months with Dr. Rayann Heman. Please schedule this appointment today.  Any Other Special Instructions Will Be Listed Below (If Applicable).  If you need a refill on your cardiac medications before your next appointment, please call your pharmacy.

## 2015-10-11 NOTE — Progress Notes (Signed)
PCP: Monico Blitz, MD Primary Cardiologist:  Dr Sherri Rad is a 75 y.o. female who presents today for routine electrophysiology followup.  Since her last visit, the patient reports doing very well.  She has occasional palpitations which she thinks may be afib.  Interestingly, when she presented recently for ekg thinking she was in afib she was actually in sinus.  All heart rates that she has journaled are consistent with sinus.  She is planning to have foot surgery soon.  She is not active and is gaining weight.  + SOB with moderate activity which she attributes to deconditioning. Today, she denies symptoms of palpitations, chest pain,  lower extremity edema, dizziness, presyncope, or syncope.  The patient is otherwise without complaint today.   Past Medical History  Diagnosis Date  . Allergic rhinitis   . Paroxysmal atrial fibrillation (HCC)     Element of tachycardia bradycardia syndrome  . Hyperlipidemia   . Essential hypertension, benign   . Anxiety   . Depression   . Lumbar disc disease   . OSA (obstructive sleep apnea)     CPAP  . Atrial flutter ([place])   . Obesity   . Respiratory failure ([place])   . COPD (chronic obstructive pulmonary disease) [facility])    Past Surgical History  Procedure Laterality Date  . Cataract extraction    . Lasik      Both eyes  . Total hip arthroplasty  2010    Right  . Anterior fusion lumbar spine    . Colonoscopy N/A 08/04/2012    Procedure: COLONOSCOPY;  Surgeon: Rogene Houston, MD;  Location: [facility] ENDO SU[facility];  Service: Endoscopy;  Laterality: N/A;  730-rescheduled to [place] notified pt  . Cardioversion N/A 03/28/2014    Procedure: CARDIOVERSION;  Surgeon: Fay Records, MD;  Location: [facility] ORS;  Service: Cardiovascular;  Laterality: N/A;  . Electrophysiologic study N/A 09/18/2014    Procedure: Atrial Fibrillation Ablation;  Surgeon: Thompson Grayer, MD;  Location: [facility] CV LAB;  Service: Cardiovascular;  Laterality: N/A;  . Tee  without cardioversion N/A 09/18/2014    Procedure: TRANSESOPHAGEAL ECHOCARDIOGRAM (TEE);  Surgeon: Larey Dresser, MD;  Location: [facility];  Service: Cardiovascular;  Laterality: N/A;    ROS- all systems are reviewed and negatives except as per HPI above  Current Outpatient Prescriptions  Medication Sig Dispense Refill  . antiseptic oral rinse (BIOTENE) LIQD 15 mLs by Mouth Rinse route daily as needed (Dry Mouth).     Marland Kitchen atorvastatin (LIPITOR) 10 MG tablet Take 10 mg by mouth daily.     . Azelastine-Fluticasone (DYMISTA NA) Place 50 mcg into the nose.    . Azelastine-Fluticasone (DYMISTA) 137-50 MCG/ACT SUSP Place 2 sprays into the nose daily. 6 g 0  . Biotin 1000 MCG tablet Take 1,000 mcg by mouth daily.     . budesonide (PULMICORT) 0.5 MG/2ML nebulizer solution Take 0.5 mg by nebulization 2 (two) times daily.     Marland Kitchen CALCIUM-MAGNESIUM-ZINC PO Take 1 capsule by mouth 2 (two) times daily.     Marland Kitchen diltiazem (CARDIZEM) 120 MG tablet TAKE 1/2 TABLET BY MOUTH TWICE DAILY 30 tablet 6  . docusate-casanthranol (PERICOLACE) 100-30 MG per capsule Take 1 capsule by mouth daily as needed for constipation.     Marland Kitchen erythromycin ethylsuccinate (EES) 400 MG tablet Take 800 mg by mouth once as needed (Take one hour prior to dental appts.).    Marland Kitchen esomeprazole (NEXIUM) 40 MG capsule Take 40 mg by  mouth 2 (two) times daily.    . flecainide (TAMBOCOR) 50 MG tablet Take 1 tablet (50 mg total) by mouth 2 (two) times daily. 180 tablet 3  . Glycopyrrolate-Formoterol (BEVESPI AEROSPHERE) 9-4.8 MCG/ACT AERO Inhale 2 puffs into the lungs 2 (two) times daily. 1 Inhaler 0  . ipratropium (ATROVENT) 0.02 % nebulizer solution Take 2.5 mLs (0.5 mg total) by nebulization every 6 (six) hours. And as needed 25 mL 12  . ipratropium-albuterol (DUONEB) 0.5-2.5 (3) MG/3ML SOLN Take 3 mLs by nebulization every 4 (four) hours as needed.    Marland Kitchen Lifitegrast (XIIDRA) 5 % SOLN Apply to eye.    . montelukast (SINGULAIR) 10 MG tablet Take 10 mg  by mouth at bedtime.    . NON FORMULARY CPAP MACHINE    . valsartan-hydrochlorothiazide (DIOVAN-HCT) 320-25 MG per tablet Take 1 tablet by mouth daily. 30 tablet 6  . warfarin (COUMADIN) 5 MG tablet Take 1 tablet (5 mg total) by mouth daily. (Patient taking differently: Take 5 mg by mouth daily. Daily except Monday 2.5 mg) 45 tablet 3   No current facility-administered medications for this visit.    Physical Exam: Filed Vitals:   10/11/15 0932  BP: 100/58  Pulse: 60  Height: 5\' 3"  (1.6 m)  Weight: 207 lb (93.895 kg)  SpO2: 98%    GEN- The patient is well appearing, alert and oriented x 3 today.   Head- normocephalic, atraumatic Eyes-  Sclera clear, conjunctiva pink Ears- hearing intact Oropharynx- clear Lungs- Clear to ausculation bilaterally, normal work of breathing Heart- Regular rate and rhythm, no murmurs, rubs or gallops, PMI not laterally displaced GI- soft, NT, ND, + BS Extremities- no clubbing, cyanosis, or edema + calf atrophy bilaterally  ekg today reveals sinus rhythm 62 bpm  Assessment and Plan: 1.  Paroxysmal atrial fibrillation and atrial flutter Doing well at this time I suspect that she has less afib than she thinks I discussed implantable loop recorder to further evaluate for afib but she decines I would not advise 30 day monitor as ILR is a better way to manage long term post ablation. Continue current medical therapy  2. Obesity Regular exercise and weight reduction are essential to maintaining sinus long term She has some SOB which she thinks is due to inactivty.  She declines echo or stress testing at this time.  3. OSA Compliance with CPAP is encouraged  4. HTN Stable No change required today  Return to see me in 6 months Follow-up with Dr Domenic Polite as scheduled  Thompson Grayer MD, [facility] 10/11/2015 10:39 AM

## 2015-10-17 DIAGNOSIS — J069 Acute upper respiratory infection, unspecified: Secondary | ICD-10-CM | POA: Diagnosis not present

## 2015-10-17 DIAGNOSIS — J449 Chronic obstructive pulmonary disease, unspecified: Secondary | ICD-10-CM | POA: Diagnosis not present

## 2015-10-30 ENCOUNTER — Ambulatory Visit: Payer: Medicare Other | Admitting: Internal Medicine

## 2015-10-30 DIAGNOSIS — I10 Essential (primary) hypertension: Secondary | ICD-10-CM | POA: Diagnosis not present

## 2015-10-30 DIAGNOSIS — J449 Chronic obstructive pulmonary disease, unspecified: Secondary | ICD-10-CM | POA: Diagnosis not present

## 2015-10-30 DIAGNOSIS — E7800 Pure hypercholesterolemia, unspecified: Secondary | ICD-10-CM | POA: Diagnosis not present

## 2015-10-30 DIAGNOSIS — I4891 Unspecified atrial fibrillation: Secondary | ICD-10-CM | POA: Diagnosis not present

## 2015-10-31 ENCOUNTER — Ambulatory Visit: Payer: Medicare Other | Admitting: Internal Medicine

## 2015-11-15 DIAGNOSIS — M9902 Segmental and somatic dysfunction of thoracic region: Secondary | ICD-10-CM | POA: Diagnosis not present

## 2015-11-15 DIAGNOSIS — M9903 Segmental and somatic dysfunction of lumbar region: Secondary | ICD-10-CM | POA: Diagnosis not present

## 2015-11-15 DIAGNOSIS — M545 Low back pain: Secondary | ICD-10-CM | POA: Diagnosis not present

## 2015-11-15 DIAGNOSIS — M9901 Segmental and somatic dysfunction of cervical region: Secondary | ICD-10-CM | POA: Diagnosis not present

## 2015-11-15 DIAGNOSIS — M542 Cervicalgia: Secondary | ICD-10-CM | POA: Diagnosis not present

## 2015-11-15 DIAGNOSIS — M546 Pain in thoracic spine: Secondary | ICD-10-CM | POA: Diagnosis not present

## 2015-11-29 DIAGNOSIS — E7800 Pure hypercholesterolemia, unspecified: Secondary | ICD-10-CM | POA: Diagnosis not present

## 2015-11-29 DIAGNOSIS — J449 Chronic obstructive pulmonary disease, unspecified: Secondary | ICD-10-CM | POA: Diagnosis not present

## 2015-11-29 DIAGNOSIS — J441 Chronic obstructive pulmonary disease with (acute) exacerbation: Secondary | ICD-10-CM | POA: Diagnosis not present

## 2015-11-29 DIAGNOSIS — I4891 Unspecified atrial fibrillation: Secondary | ICD-10-CM | POA: Diagnosis not present

## 2015-12-06 DIAGNOSIS — M542 Cervicalgia: Secondary | ICD-10-CM | POA: Diagnosis not present

## 2015-12-06 DIAGNOSIS — M9902 Segmental and somatic dysfunction of thoracic region: Secondary | ICD-10-CM | POA: Diagnosis not present

## 2015-12-06 DIAGNOSIS — M9903 Segmental and somatic dysfunction of lumbar region: Secondary | ICD-10-CM | POA: Diagnosis not present

## 2015-12-06 DIAGNOSIS — M9901 Segmental and somatic dysfunction of cervical region: Secondary | ICD-10-CM | POA: Diagnosis not present

## 2015-12-06 DIAGNOSIS — M546 Pain in thoracic spine: Secondary | ICD-10-CM | POA: Diagnosis not present

## 2015-12-06 DIAGNOSIS — M545 Low back pain: Secondary | ICD-10-CM | POA: Diagnosis not present

## 2015-12-13 DIAGNOSIS — Z789 Other specified health status: Secondary | ICD-10-CM | POA: Diagnosis not present

## 2015-12-13 DIAGNOSIS — J029 Acute pharyngitis, unspecified: Secondary | ICD-10-CM | POA: Diagnosis not present

## 2015-12-13 DIAGNOSIS — J449 Chronic obstructive pulmonary disease, unspecified: Secondary | ICD-10-CM | POA: Diagnosis not present

## 2015-12-13 DIAGNOSIS — I4891 Unspecified atrial fibrillation: Secondary | ICD-10-CM | POA: Diagnosis not present

## 2015-12-13 DIAGNOSIS — Z6838 Body mass index (BMI) 38.0-38.9, adult: Secondary | ICD-10-CM | POA: Diagnosis not present

## 2015-12-29 ENCOUNTER — Other Ambulatory Visit: Payer: Self-pay | Admitting: Internal Medicine

## 2015-12-31 DIAGNOSIS — Z23 Encounter for immunization: Secondary | ICD-10-CM | POA: Diagnosis not present

## 2016-01-01 DIAGNOSIS — I4891 Unspecified atrial fibrillation: Secondary | ICD-10-CM | POA: Diagnosis not present

## 2016-01-01 DIAGNOSIS — J449 Chronic obstructive pulmonary disease, unspecified: Secondary | ICD-10-CM | POA: Diagnosis not present

## 2016-01-01 DIAGNOSIS — J019 Acute sinusitis, unspecified: Secondary | ICD-10-CM | POA: Diagnosis not present

## 2016-01-01 DIAGNOSIS — Z6838 Body mass index (BMI) 38.0-38.9, adult: Secondary | ICD-10-CM | POA: Diagnosis not present

## 2016-01-01 DIAGNOSIS — Z299 Encounter for prophylactic measures, unspecified: Secondary | ICD-10-CM | POA: Diagnosis not present

## 2016-01-03 ENCOUNTER — Other Ambulatory Visit (HOSPITAL_COMMUNITY): Payer: Self-pay | Admitting: Internal Medicine

## 2016-01-03 DIAGNOSIS — Z1231 Encounter for screening mammogram for malignant neoplasm of breast: Secondary | ICD-10-CM

## 2016-01-07 DIAGNOSIS — J449 Chronic obstructive pulmonary disease, unspecified: Secondary | ICD-10-CM | POA: Diagnosis not present

## 2016-01-07 DIAGNOSIS — I4891 Unspecified atrial fibrillation: Secondary | ICD-10-CM | POA: Diagnosis not present

## 2016-01-07 DIAGNOSIS — G4730 Sleep apnea, unspecified: Secondary | ICD-10-CM | POA: Diagnosis not present

## 2016-01-07 DIAGNOSIS — Z6838 Body mass index (BMI) 38.0-38.9, adult: Secondary | ICD-10-CM | POA: Diagnosis not present

## 2016-01-09 ENCOUNTER — Ambulatory Visit (HOSPITAL_COMMUNITY)
Admission: RE | Admit: 2016-01-09 | Discharge: 2016-01-09 | Disposition: A | Discharge status: Home / Self Care | Payer: Medicare Other | Source: Ambulatory Visit | Attending: Internal Medicine | Admitting: Internal Medicine

## 2016-01-09 DIAGNOSIS — Z1231 Encounter for screening mammogram for malignant neoplasm of breast: Secondary | ICD-10-CM | POA: Diagnosis not present

## 2016-01-13 DIAGNOSIS — M9902 Segmental and somatic dysfunction of thoracic region: Secondary | ICD-10-CM | POA: Diagnosis not present

## 2016-01-13 DIAGNOSIS — M546 Pain in thoracic spine: Secondary | ICD-10-CM | POA: Diagnosis not present

## 2016-01-13 DIAGNOSIS — M9901 Segmental and somatic dysfunction of cervical region: Secondary | ICD-10-CM | POA: Diagnosis not present

## 2016-01-13 DIAGNOSIS — M545 Low back pain: Secondary | ICD-10-CM | POA: Diagnosis not present

## 2016-01-13 DIAGNOSIS — M542 Cervicalgia: Secondary | ICD-10-CM | POA: Diagnosis not present

## 2016-01-13 DIAGNOSIS — M9903 Segmental and somatic dysfunction of lumbar region: Secondary | ICD-10-CM | POA: Diagnosis not present

## 2016-01-15 DIAGNOSIS — M9901 Segmental and somatic dysfunction of cervical region: Secondary | ICD-10-CM | POA: Diagnosis not present

## 2016-01-15 DIAGNOSIS — M545 Low back pain: Secondary | ICD-10-CM | POA: Diagnosis not present

## 2016-01-15 DIAGNOSIS — M9902 Segmental and somatic dysfunction of thoracic region: Secondary | ICD-10-CM | POA: Diagnosis not present

## 2016-01-15 DIAGNOSIS — J449 Chronic obstructive pulmonary disease, unspecified: Secondary | ICD-10-CM | POA: Diagnosis not present

## 2016-01-15 DIAGNOSIS — M546 Pain in thoracic spine: Secondary | ICD-10-CM | POA: Diagnosis not present

## 2016-01-15 DIAGNOSIS — Z299 Encounter for prophylactic measures, unspecified: Secondary | ICD-10-CM | POA: Diagnosis not present

## 2016-01-15 DIAGNOSIS — M542 Cervicalgia: Secondary | ICD-10-CM | POA: Diagnosis not present

## 2016-01-15 DIAGNOSIS — I4891 Unspecified atrial fibrillation: Secondary | ICD-10-CM | POA: Diagnosis not present

## 2016-01-15 DIAGNOSIS — M9903 Segmental and somatic dysfunction of lumbar region: Secondary | ICD-10-CM | POA: Diagnosis not present

## 2016-01-17 DIAGNOSIS — M9902 Segmental and somatic dysfunction of thoracic region: Secondary | ICD-10-CM | POA: Diagnosis not present

## 2016-01-17 DIAGNOSIS — M546 Pain in thoracic spine: Secondary | ICD-10-CM | POA: Diagnosis not present

## 2016-01-17 DIAGNOSIS — M545 Low back pain: Secondary | ICD-10-CM | POA: Diagnosis not present

## 2016-01-17 DIAGNOSIS — M9903 Segmental and somatic dysfunction of lumbar region: Secondary | ICD-10-CM | POA: Diagnosis not present

## 2016-01-17 DIAGNOSIS — M542 Cervicalgia: Secondary | ICD-10-CM | POA: Diagnosis not present

## 2016-01-17 DIAGNOSIS — M9901 Segmental and somatic dysfunction of cervical region: Secondary | ICD-10-CM | POA: Diagnosis not present

## 2016-01-29 ENCOUNTER — Telehealth: Payer: Self-pay | Admitting: Internal Medicine

## 2016-01-29 NOTE — Telephone Encounter (Signed)
lmtcb x1 for pt. 

## 2016-01-29 NOTE — Telephone Encounter (Signed)
Patient returned call, CB is [phone]. °

## 2016-01-29 NOTE — Telephone Encounter (Signed)
Spoke with pt. She is requesting samples of Bevespi and Dymista. Samples will be left at the front desk. Nothing further was needed.

## 2016-02-04 DIAGNOSIS — Z713 Dietary counseling and surveillance: Secondary | ICD-10-CM | POA: Diagnosis not present

## 2016-02-04 DIAGNOSIS — I4891 Unspecified atrial fibrillation: Secondary | ICD-10-CM | POA: Diagnosis not present

## 2016-02-04 DIAGNOSIS — H04123 Dry eye syndrome of bilateral lacrimal glands: Secondary | ICD-10-CM | POA: Diagnosis not present

## 2016-02-04 DIAGNOSIS — Z299 Encounter for prophylactic measures, unspecified: Secondary | ICD-10-CM | POA: Diagnosis not present

## 2016-02-04 DIAGNOSIS — Z6837 Body mass index (BMI) 37.0-37.9, adult: Secondary | ICD-10-CM | POA: Diagnosis not present

## 2016-02-07 ENCOUNTER — Telehealth (HOSPITAL_COMMUNITY): Payer: Self-pay | Admitting: *Deleted

## 2016-02-07 NOTE — Telephone Encounter (Signed)
Patient called in stating since Wednesday she has been in afib HR in the 100-120 range BP 126/67. She is taking the PRN cardizem but it does not really decrease her heart rate. Discussed with Roderic Palau NP will increase daily cardizem to 120mg  in the morning and 60mg  in the evening -- if over weekend her HR continues to be elevated above 100 she can increase the cardizem to 120mg  BID and we will see her on Monday. She will call on Monday with report of how she is feeling. Pt verbalized understanding.

## 2016-02-10 ENCOUNTER — Ambulatory Visit (HOSPITAL_COMMUNITY)
Admission: RE | Admit: 2016-02-10 | Discharge: 2016-02-10 | Disposition: A | Discharge status: Home / Self Care | Payer: Medicare Other | Source: Ambulatory Visit | Attending: Nurse Practitioner | Admitting: Nurse Practitioner

## 2016-02-10 ENCOUNTER — Encounter (HOSPITAL_COMMUNITY): Payer: Self-pay | Admitting: Nurse Practitioner

## 2016-02-10 VITALS — BP 116/74 | Ht 63.0 in | Wt 208.4 lb

## 2016-02-10 DIAGNOSIS — Z9849 Cataract extraction status, unspecified eye: Secondary | ICD-10-CM | POA: Diagnosis not present

## 2016-02-10 DIAGNOSIS — Z8249 Family history of ischemic heart disease and other diseases of the circulatory system: Secondary | ICD-10-CM | POA: Insufficient documentation

## 2016-02-10 DIAGNOSIS — I480 Paroxysmal atrial fibrillation: Secondary | ICD-10-CM | POA: Diagnosis not present

## 2016-02-10 DIAGNOSIS — I4892 Unspecified atrial flutter: Secondary | ICD-10-CM | POA: Insufficient documentation

## 2016-02-10 DIAGNOSIS — Z809 Family history of malignant neoplasm, unspecified: Secondary | ICD-10-CM | POA: Diagnosis not present

## 2016-02-10 DIAGNOSIS — Z800 Family history of malignant neoplasm of digestive organs: Secondary | ICD-10-CM | POA: Insufficient documentation

## 2016-02-10 DIAGNOSIS — J449 Chronic obstructive pulmonary disease, unspecified: Secondary | ICD-10-CM | POA: Insufficient documentation

## 2016-02-10 DIAGNOSIS — K219 Gastro-esophageal reflux disease without esophagitis: Secondary | ICD-10-CM | POA: Diagnosis not present

## 2016-02-10 DIAGNOSIS — M5136 Other intervertebral disc degeneration, lumbar region: Secondary | ICD-10-CM | POA: Diagnosis not present

## 2016-02-10 DIAGNOSIS — Z888 Allergy status to other drugs, medicaments and biological substances status: Secondary | ICD-10-CM | POA: Insufficient documentation

## 2016-02-10 DIAGNOSIS — G4733 Obstructive sleep apnea (adult) (pediatric): Secondary | ICD-10-CM | POA: Insufficient documentation

## 2016-02-10 DIAGNOSIS — Z880 Allergy status to penicillin: Secondary | ICD-10-CM | POA: Diagnosis not present

## 2016-02-10 DIAGNOSIS — E669 Obesity, unspecified: Secondary | ICD-10-CM | POA: Insufficient documentation

## 2016-02-10 DIAGNOSIS — F418 Other specified anxiety disorders: Secondary | ICD-10-CM | POA: Diagnosis not present

## 2016-02-10 DIAGNOSIS — I10 Essential (primary) hypertension: Secondary | ICD-10-CM | POA: Diagnosis not present

## 2016-02-10 DIAGNOSIS — Z7901 Long term (current) use of anticoagulants: Secondary | ICD-10-CM | POA: Diagnosis not present

## 2016-02-10 DIAGNOSIS — E785 Hyperlipidemia, unspecified: Secondary | ICD-10-CM | POA: Insufficient documentation

## 2016-02-10 DIAGNOSIS — Z96641 Presence of right artificial hip joint: Secondary | ICD-10-CM | POA: Insufficient documentation

## 2016-02-10 DIAGNOSIS — Z6836 Body mass index (BMI) 36.0-36.9, adult: Secondary | ICD-10-CM | POA: Insufficient documentation

## 2016-02-10 DIAGNOSIS — Z981 Arthrodesis status: Secondary | ICD-10-CM | POA: Diagnosis not present

## 2016-02-10 MED ORDER — DILTIAZEM HCL 120 MG PO TABS: 120.0000 mg | ORAL_TABLET | Freq: Two times a day (BID) | ORAL | 3 refills | Status: DC

## 2016-02-10 MED ORDER — FLECAINIDE ACETATE 100 MG PO TABS: 100.0000 mg | ORAL_TABLET | Freq: Two times a day (BID) | ORAL | 1 refills | Status: DC

## 2016-02-10 NOTE — Telephone Encounter (Signed)
Pt called back this morning stating her HR is still up around 79-90 range whereas its normally in the 50s. Will come in for appt today to be assessed.

## 2016-02-10 NOTE — Patient Instructions (Signed)
Your physician has recommended you make the following change in your medication:  Increase Flecainide to 100 mg twice a day Continue the diltiazem 120 mg twice a day. These rxs were sent into your pharmacy.   You have been referred to Dr. Laural Golden in [place].  Their office will contact you to make an appointment.   We will request a nurse from Dr. Chuck Hint office to call you to set up an appt for Friday morning.

## 2016-02-10 NOTE — Progress Notes (Signed)
Primary Care Physician: Monico Blitz, MD Referring Physician: Dr. [name] is a 75 y.o. female with a h/o paroxysmal afib that is here for evaluation in the afib clinic. She has h/o ablation 08/2014. Flecainide was decreased to 50 mg bid from 100 mg bid after ablation due to doing well and later stopped. She did have to go back on flecainide 50 mg bid at a later date for recurrent afib. She is in the afib clinic for paroxysmal afib since last Wednesday pm. She does not know any specific triggers but struggles with her weight and has gained 5-6 lbs. She goes to the gym on a regular basis but admits to not making wise choices with her diet.. No alcohol or caffeine, uses CPAP. She is having a lot of issues with GERD and abdominal pain and is wanting to have evaluation with GI.   Today, she denies symptoms of palpitations, chest pain, shortness of breath, orthopnea, PND, lower extremity edema, dizziness, presyncope, syncope, or neurologic sequela. The patient is tolerating medications without difficulties and is otherwise without complaint today.   Past Medical History:  Diagnosis Date  . Allergic rhinitis   . Anxiety   . Atrial flutter ([place])   . COPD (chronic obstructive pulmonary disease) ([place])   . Depression   . Essential hypertension, benign   . Hyperlipidemia   . Lumbar disc disease   . Obesity   . OSA (obstructive sleep apnea)    CPAP  . Paroxysmal atrial fibrillation (HCC)    Element of tachycardia bradycardia syndrome  . Respiratory failure [facility])    Past Surgical History:  Procedure Laterality Date  . ANTERIOR FUSION LUMBAR SPINE    . CARDIOVERSION N/A 03/28/2014   Procedure: CARDIOVERSION;  Surgeon: Fay Records, MD;  Location: [facility];  Service: Cardiovascular;  Laterality: N/A;  . CATARACT EXTRACTION    . COLONOSCOPY N/A 08/04/2012   Procedure: COLONOSCOPY;  Surgeon: Rogene Houston, MD;  [facility];  Service: Endoscopy;  Laterality: N/A;   730-rescheduled to [place] notified pt  . ELECTROPHYSIOLOGIC STUDY N/A 09/18/2014   Procedure: Atrial Fibrillation Ablation;  Surgeon: Thompson Grayer, MD;  Location: [place] CV LAB;  Service: Cardiovascular;  Laterality: N/A;  . LASIK     Both eyes  . TEE WITHOUT CARDIOVERSION N/A 09/18/2014   Procedure: TRANSESOPHAGEAL ECHOCARDIOGRAM (TEE);  Surgeon: Larey Dresser, MD;  Location: [place];  Service: Cardiovascular;  Laterality: N/A;  . TOTAL HIP ARTHROPLASTY  2010   Right    Current Outpatient Prescriptions  Medication Sig Dispense Refill  . antiseptic oral rinse (BIOTENE) LIQD 15 mLs by Mouth Rinse route daily as needed (Dry Mouth).     Marland Kitchen atorvastatin (LIPITOR) 10 MG tablet Take 10 mg by mouth daily.     . Biotin 1000 MCG tablet Take 1,000 mcg by mouth daily.     . budesonide (PULMICORT) 0.5 MG/2ML nebulizer solution Take 0.5 mg by nebulization 2 (two) times daily.     Marland Kitchen CALCIUM-MAGNESIUM-ZINC PO Take 1 capsule by mouth 2 (two) times daily.     Marland Kitchen diltiazem (CARDIZEM) 120 MG tablet Take 1 tablet (120 mg total) by mouth 2 (two) times daily. 180 tablet 3  . docusate-casanthranol (PERICOLACE) 100-30 MG per capsule Take 1 capsule by mouth daily as needed for constipation.     Marland Kitchen erythromycin ethylsuccinate (EES) 400 MG tablet Take 800 mg by mouth once as needed (Take one hour prior to dental  appts.).    Marland Kitchen esomeprazole (NEXIUM) 40 MG capsule Take 40 mg by mouth 2 (two) times daily.    . Glycopyrrolate-Formoterol (BEVESPI AEROSPHERE) 9-4.8 MCG/ACT AERO Inhale 2 puffs into the lungs 2 (two) times daily. 1 Inhaler 0  . montelukast (SINGULAIR) 10 MG tablet Take 10 mg by mouth at bedtime.    . NON FORMULARY CPAP MACHINE    . valsartan-hydrochlorothiazide (DIOVAN-HCT) 320-25 MG per tablet Take 1 tablet by mouth daily. 30 tablet 6  . warfarin (COUMADIN) 5 MG tablet Take 1 tablet (5 mg total) by mouth daily. (Patient taking differently: Take 5 mg by mouth daily. Daily except Monday 2.5 mg) 45  tablet 3  . Azelastine-Fluticasone (DYMISTA) 137-50 MCG/ACT SUSP Place 2 sprays into the nose daily. (Patient not taking: Reported on 02/10/2016) 6 g 0  . flecainide (TAMBOCOR) 100 MG tablet Take 1 tablet (100 mg total) by mouth 2 (two) times daily. 180 tablet 1   No current facility-administered medications for this encounter.     Allergies  Allergen Reactions  . Albuterol Other (See Comments)    AFIB and shakes  . Bupropion Hcl Other (See Comments)    Suicidal Thoughts.   . Escitalopram Oxalate Other (See Comments)    Suicidal Thoughts.   . Fluticasone-Salmeterol Other (See Comments)    Caused patient to go into Afib.   Marland Kitchen Lexapro [Escitalopram Oxalate]   . Lisinopril   . Serevent Other (See Comments)    Caused patient to go into Afib  . Serevent [Salmeterol]   . Wellbutrin [Bupropion]   . Amoxicillin Rash  . Macrodantin [Nitrofurantoin] Rash  . Penicillins Rash    Social History   Social History  . Marital status: Single    Spouse name: N/A  . Number of children: N/A  . Years of education: N/A   Occupational History  . Retired: Presenter, broadcasting    Social History Main Topics  . Smoking status: Never Smoker  . Smokeless tobacco: Never Used  . Alcohol use No  . Drug use: No  . Sexual activity: Not on file   Other Topics Concern  . Not on file   Social History Narrative   Pt lives in [place] alone. She was never married.   Retired Customer service manager.   Attends [employer]    Family History  Problem Relation Age of Onset  . Cancer Mother   . Heart attack Father   . Colon cancer Other     ROS- All systems are reviewed and negative except as per the HPI above  Physical Exam: Vitals:   02/10/16 1436  BP: 116/74  Weight: 208 lb 6.4 oz (94.5 kg)  Height: 5\' 3"  (1.6 m)    GEN- The patient is well appearing, alert and oriented x 3 today.   Head- normocephalic, atraumatic Eyes-  Sclera clear, conjunctiva pink Ears- hearing intact Oropharynx-  clear Neck- supple, no JVP Lymph- no cervical lymphadenopathy Lungs- Clear to ausculation bilaterally, normal work of breathing Heart- irregular rate and rhythm, no murmurs, rubs or gallops, PMI not laterally displaced GI- soft, NT, ND, + BS Extremities- no clubbing, cyanosis, or edema MS- no significant deformity or atrophy Skin- no rash or lesion Psych- euthymic mood, full affect Neuro- strength and sensation are intact  EKG-atrial fibrillation at 97 bpm, qrs int 98 ms, qtc 474 ms Epic records reviewed  Assessment and Plan: 1. Paroxysmal afib Increase flecainide to 100 mg bid Continue Cardizem at 120 mg bid Continue warfarin 5  mg qd  2. Lifestyles issues Weight loss recommended Continue gym classes Continue CPAP GI consult requested for increasing GERD/abd symptoms which pt fees may be triggering her afib episodes  3. HTN Stable  Arieon Corcoran C. Daquawn Seelman, [facility] [address] [place], [place] [address] [phone]

## 2016-02-14 ENCOUNTER — Telehealth (HOSPITAL_COMMUNITY): Payer: Self-pay | Admitting: *Deleted

## 2016-02-14 ENCOUNTER — Other Ambulatory Visit (HOSPITAL_COMMUNITY): Payer: Self-pay | Admitting: *Deleted

## 2016-02-14 ENCOUNTER — Ambulatory Visit (INDEPENDENT_AMBULATORY_CARE_PROVIDER_SITE_OTHER): Payer: Medicare Other

## 2016-02-14 VITALS — BP 110/68 | HR 51

## 2016-02-14 DIAGNOSIS — I4891 Unspecified atrial fibrillation: Secondary | ICD-10-CM | POA: Diagnosis not present

## 2016-02-14 DIAGNOSIS — K219 Gastro-esophageal reflux disease without esophagitis: Secondary | ICD-10-CM

## 2016-02-14 MED ORDER — DILTIAZEM HCL 120 MG PO TABS: 120.0000 mg | ORAL_TABLET | Freq: Every day | ORAL | 3 refills | Status: DC

## 2016-02-14 NOTE — Patient Instructions (Signed)
Roderic Palau NP will contact you     Thank you for choosing [facility] !

## 2016-02-14 NOTE — Progress Notes (Signed)
EKG obtained in Epic for [name] Palau NP,faxed to [facility] [phone]

## 2016-02-14 NOTE — Telephone Encounter (Signed)
Pt in for repeat EKG after increasing flecainide to 100mg  twice a day. Pt reports feeling tired but believes she returned to normal rhythm on Wednesday. Roderic Palau NP recommended decreasing cardizem to 120mg  once a day may increase to BID if return to afib. She is to continue flecainide 100mg  BID. Pt has follow up appointment set with Dr. Rayann Heman for 1/5 in [place]. Pt will call if further issues or concerns. Pt in agreement with plan.

## 2016-02-18 ENCOUNTER — Encounter (INDEPENDENT_AMBULATORY_CARE_PROVIDER_SITE_OTHER): Payer: Self-pay | Admitting: *Deleted

## 2016-02-26 DIAGNOSIS — I10 Essential (primary) hypertension: Secondary | ICD-10-CM | POA: Diagnosis not present

## 2016-02-26 DIAGNOSIS — J441 Chronic obstructive pulmonary disease with (acute) exacerbation: Secondary | ICD-10-CM | POA: Diagnosis not present

## 2016-02-26 DIAGNOSIS — J449 Chronic obstructive pulmonary disease, unspecified: Secondary | ICD-10-CM | POA: Diagnosis not present

## 2016-02-26 DIAGNOSIS — Z299 Encounter for prophylactic measures, unspecified: Secondary | ICD-10-CM | POA: Diagnosis not present

## 2016-02-26 DIAGNOSIS — Z6837 Body mass index (BMI) 37.0-37.9, adult: Secondary | ICD-10-CM | POA: Diagnosis not present

## 2016-03-03 DIAGNOSIS — H04123 Dry eye syndrome of bilateral lacrimal glands: Secondary | ICD-10-CM | POA: Diagnosis not present

## 2016-03-03 DIAGNOSIS — H5213 Myopia, bilateral: Secondary | ICD-10-CM | POA: Diagnosis not present

## 2016-03-04 ENCOUNTER — Other Ambulatory Visit (INDEPENDENT_AMBULATORY_CARE_PROVIDER_SITE_OTHER): Payer: Self-pay | Admitting: Internal Medicine

## 2016-03-04 ENCOUNTER — Encounter (INDEPENDENT_AMBULATORY_CARE_PROVIDER_SITE_OTHER): Payer: Self-pay | Admitting: *Deleted

## 2016-03-04 ENCOUNTER — Ambulatory Visit (INDEPENDENT_AMBULATORY_CARE_PROVIDER_SITE_OTHER): Payer: Medicare Other | Admitting: Internal Medicine

## 2016-03-04 ENCOUNTER — Encounter (INDEPENDENT_AMBULATORY_CARE_PROVIDER_SITE_OTHER): Payer: Self-pay | Admitting: Internal Medicine

## 2016-03-04 VITALS — BP 180/70 | HR 60 | Temp 97.8°F | Ht 63.0 in | Wt 210.7 lb

## 2016-03-04 DIAGNOSIS — K219 Gastro-esophageal reflux disease without esophagitis: Secondary | ICD-10-CM

## 2016-03-04 NOTE — Patient Instructions (Signed)
EGD. The risks and benefits such as perforation, bleeding, and infection were reviewed with the patient and is agreeable. 

## 2016-03-04 NOTE — Progress Notes (Signed)
Subjective:    Patient ID: [name] [name], female    DOB: Nov 10, 1940, 75 y.o.   MRN: [id number]  HPIReferred by Dr. Manuella Ghazi for GERD. She says when she has acid reflux she goes into to atrial fib. She was Nexium BID but now she is taking it once a day. Before this she was taking Prilosec. She says her throat stays sore a lot. She coughs when she eats.  She says her grandfather had throat cancer. Grandmother had colon cancer. R One sister had cancer of the bone.  She is requesting and EGD.  She says her acid reflux is really not that bad, She is watching what she is eating.  She recently finished a Z pack for sinus drainage.  Last colonoscopy in 2014  Hx of atrial fib and maintained on Coumadin.  Hx of cardiac ablation last year.   Review of Systems Past Medical History:  Diagnosis Date  . Allergic rhinitis   . Anxiety   . Atrial flutter ([place])   . COPD (chronic obstructive pulmonary disease) ([place])   . Depression   . Essential hypertension, benign   . Hyperlipidemia   . Lumbar disc disease   . Obesity   . OSA (obstructive sleep apnea)    CPAP  . Paroxysmal atrial fibrillation (HCC)    Element of tachycardia bradycardia syndrome  . Respiratory failure [facility])     Past Surgical History:  Procedure Laterality Date  . ANTERIOR FUSION LUMBAR SPINE    . CARDIOVERSION N/A 03/28/2014   Procedure: CARDIOVERSION;  Surgeon: Fay Records, MD;  [facility];  Service: Cardiovascular;  Laterality: N/A;  . CATARACT EXTRACTION    . COLONOSCOPY N/A 08/04/2012   Procedure: COLONOSCOPY;  Surgeon: Rogene Houston, MD;  [facility];  Service: Endoscopy;  Laterality: N/A;  730-rescheduled to [place] notified pt  . ELECTROPHYSIOLOGIC STUDY N/A 09/18/2014   Procedure: Atrial Fibrillation Ablation;  Surgeon: Thompson Grayer, MD;  Location: [place] [facility];  Service: Cardiovascular;  Laterality: N/A;  . LASIK     Both eyes  . TEE WITHOUT CARDIOVERSION N/A 09/18/2014   Procedure:  TRANSESOPHAGEAL ECHOCARDIOGRAM (TEE);  Surgeon: Larey Dresser, MD;  Location: [place];  Service: Cardiovascular;  Laterality: N/A;  . TOTAL HIP ARTHROPLASTY  2010   Right    Allergies  Allergen Reactions  . Albuterol Other (See Comments)    AFIB and shakes  . Bupropion Hcl Other (See Comments)    Suicidal Thoughts.   . Escitalopram Oxalate Other (See Comments)    Suicidal Thoughts.   . Fluticasone-Salmeterol Other (See Comments)    Caused patient to go into Afib.   Marland Kitchen Lexapro [Escitalopram Oxalate]   . Lisinopril   . Serevent Other (See Comments)    Caused patient to go into Afib  . Serevent [Salmeterol]   . Wellbutrin [Bupropion]   . Amoxicillin Rash  . Macrodantin [Nitrofurantoin] Rash  . Penicillins Rash    Current Outpatient Prescriptions on File Prior to Visit  Medication Sig Dispense Refill  . antiseptic oral rinse (BIOTENE) LIQD 15 mLs by Mouth Rinse route daily as needed (Dry Mouth).     . Biotin 1000 MCG tablet Take 1,000 mcg by mouth daily.     . budesonide (PULMICORT) 0.5 MG/2ML nebulizer solution Take 0.5 mg by nebulization 2 (two) times daily.     Marland Kitchen CALCIUM-MAGNESIUM-ZINC PO Take 1 capsule by mouth 2 (two) times daily.     Marland Kitchen diltiazem (CARDIZEM) 120  MG tablet Take 1 tablet (120 mg total) by mouth daily. 180 tablet 3  . docusate-casanthranol (PERICOLACE) 100-30 MG per capsule Take 1 capsule by mouth daily as needed for constipation.     Marland Kitchen erythromycin ethylsuccinate (EES) 400 MG tablet Take 800 mg by mouth once as needed (Take one hour prior to dental appts.).    Marland Kitchen esomeprazole (NEXIUM) 40 MG capsule Take 40 mg by mouth daily.     . flecainide (TAMBOCOR) 100 MG tablet Take 1 tablet (100 mg total) by mouth 2 (two) times daily. 180 tablet 1  . montelukast (SINGULAIR) 10 MG tablet Take 10 mg by mouth at bedtime.    . valsartan-hydrochlorothiazide (DIOVAN-HCT) 320-25 MG per tablet Take 1 tablet by mouth daily. 30 tablet 6  . warfarin (COUMADIN) 5 MG tablet Take 1  tablet (5 mg total) by mouth daily. (Patient taking differently: Take 5 mg by mouth daily. Daily except Monday 2.5 mg) 45 tablet 3   No current facility-administered medications on file prior to visit.        Objective:   Physical Exam  Vitals:   03/04/16 1514  Weight: 210 lb 11.2 oz (95.6 kg)  Height: 5\' 3"  (1.6 m)   Alert and oriented. Skin warm and dry. Oral mucosa is moist.   . Sclera anicteric, conjunctivae is pink. Thyroid not enlarged. No cervical lymphadenopathy. Lungs clear. Heart regular rate and rhythm.  Abdomen is soft. Bowel sounds are positive. No hepatomegaly. No abdominal masses felt. No tenderness.  No edema to lower extremities.          Assessment & Plan:  GERD.  She says she has had a sore throat for months. EGD. Patient would like to proceed with an EGD to rule out PUD.

## 2016-03-05 DIAGNOSIS — L814 Other melanin hyperpigmentation: Secondary | ICD-10-CM | POA: Diagnosis not present

## 2016-03-05 DIAGNOSIS — L570 Actinic keratosis: Secondary | ICD-10-CM | POA: Diagnosis not present

## 2016-03-05 DIAGNOSIS — Z1389 Encounter for screening for other disorder: Secondary | ICD-10-CM | POA: Diagnosis not present

## 2016-03-05 DIAGNOSIS — Z6838 Body mass index (BMI) 38.0-38.9, adult: Secondary | ICD-10-CM | POA: Diagnosis not present

## 2016-03-05 DIAGNOSIS — B078 Other viral warts: Secondary | ICD-10-CM | POA: Diagnosis not present

## 2016-03-05 DIAGNOSIS — D485 Neoplasm of uncertain behavior of skin: Secondary | ICD-10-CM | POA: Diagnosis not present

## 2016-03-05 DIAGNOSIS — Z85828 Personal history of other malignant neoplasm of skin: Secondary | ICD-10-CM | POA: Diagnosis not present

## 2016-03-05 DIAGNOSIS — E559 Vitamin D deficiency, unspecified: Secondary | ICD-10-CM | POA: Diagnosis not present

## 2016-03-05 DIAGNOSIS — Z1211 Encounter for screening for malignant neoplasm of colon: Secondary | ICD-10-CM | POA: Diagnosis not present

## 2016-03-05 DIAGNOSIS — Z79899 Other long term (current) drug therapy: Secondary | ICD-10-CM | POA: Diagnosis not present

## 2016-03-05 DIAGNOSIS — F329 Major depressive disorder, single episode, unspecified: Secondary | ICD-10-CM | POA: Diagnosis not present

## 2016-03-05 DIAGNOSIS — D1801 Hemangioma of skin and subcutaneous tissue: Secondary | ICD-10-CM | POA: Diagnosis not present

## 2016-03-05 DIAGNOSIS — Z299 Encounter for prophylactic measures, unspecified: Secondary | ICD-10-CM | POA: Diagnosis not present

## 2016-03-05 DIAGNOSIS — Z7189 Other specified counseling: Secondary | ICD-10-CM | POA: Diagnosis not present

## 2016-03-05 DIAGNOSIS — L821 Other seborrheic keratosis: Secondary | ICD-10-CM | POA: Diagnosis not present

## 2016-03-05 DIAGNOSIS — J449 Chronic obstructive pulmonary disease, unspecified: Secondary | ICD-10-CM | POA: Diagnosis not present

## 2016-03-05 DIAGNOSIS — I4891 Unspecified atrial fibrillation: Secondary | ICD-10-CM | POA: Diagnosis not present

## 2016-03-05 DIAGNOSIS — E7800 Pure hypercholesterolemia, unspecified: Secondary | ICD-10-CM | POA: Diagnosis not present

## 2016-03-05 DIAGNOSIS — R5383 Other fatigue: Secondary | ICD-10-CM | POA: Diagnosis not present

## 2016-03-05 DIAGNOSIS — Z0000 Encounter for general adult medical examination without abnormal findings: Secondary | ICD-10-CM | POA: Diagnosis not present

## 2016-03-11 DIAGNOSIS — M546 Pain in thoracic spine: Secondary | ICD-10-CM | POA: Diagnosis not present

## 2016-03-11 DIAGNOSIS — M545 Low back pain: Secondary | ICD-10-CM | POA: Diagnosis not present

## 2016-03-11 DIAGNOSIS — M542 Cervicalgia: Secondary | ICD-10-CM | POA: Diagnosis not present

## 2016-03-11 DIAGNOSIS — M9902 Segmental and somatic dysfunction of thoracic region: Secondary | ICD-10-CM | POA: Diagnosis not present

## 2016-03-11 DIAGNOSIS — M9903 Segmental and somatic dysfunction of lumbar region: Secondary | ICD-10-CM | POA: Diagnosis not present

## 2016-03-11 DIAGNOSIS — M9901 Segmental and somatic dysfunction of cervical region: Secondary | ICD-10-CM | POA: Diagnosis not present

## 2016-03-18 DIAGNOSIS — E2839 Other primary ovarian failure: Secondary | ICD-10-CM | POA: Diagnosis not present

## 2016-03-18 DIAGNOSIS — J441 Chronic obstructive pulmonary disease with (acute) exacerbation: Secondary | ICD-10-CM | POA: Diagnosis not present

## 2016-03-18 DIAGNOSIS — M87059 Idiopathic aseptic necrosis of unspecified femur: Secondary | ICD-10-CM | POA: Diagnosis not present

## 2016-03-18 DIAGNOSIS — Z299 Encounter for prophylactic measures, unspecified: Secondary | ICD-10-CM | POA: Diagnosis not present

## 2016-03-18 DIAGNOSIS — Z789 Other specified health status: Secondary | ICD-10-CM | POA: Diagnosis not present

## 2016-03-31 DIAGNOSIS — H04123 Dry eye syndrome of bilateral lacrimal glands: Secondary | ICD-10-CM | POA: Diagnosis not present

## 2016-04-02 DIAGNOSIS — J449 Chronic obstructive pulmonary disease, unspecified: Secondary | ICD-10-CM | POA: Diagnosis not present

## 2016-04-02 DIAGNOSIS — I4891 Unspecified atrial fibrillation: Secondary | ICD-10-CM | POA: Diagnosis not present

## 2016-04-02 DIAGNOSIS — Z6838 Body mass index (BMI) 38.0-38.9, adult: Secondary | ICD-10-CM | POA: Diagnosis not present

## 2016-04-02 DIAGNOSIS — Z789 Other specified health status: Secondary | ICD-10-CM | POA: Diagnosis not present

## 2016-04-02 DIAGNOSIS — E7800 Pure hypercholesterolemia, unspecified: Secondary | ICD-10-CM | POA: Diagnosis not present

## 2016-04-02 DIAGNOSIS — Z299 Encounter for prophylactic measures, unspecified: Secondary | ICD-10-CM | POA: Diagnosis not present

## 2016-04-02 DIAGNOSIS — I10 Essential (primary) hypertension: Secondary | ICD-10-CM | POA: Diagnosis not present

## 2016-04-03 ENCOUNTER — Ambulatory Visit (INDEPENDENT_AMBULATORY_CARE_PROVIDER_SITE_OTHER): Payer: Medicare Other | Admitting: Internal Medicine

## 2016-04-03 ENCOUNTER — Encounter: Payer: Self-pay | Admitting: Internal Medicine

## 2016-04-03 VITALS — BP 121/67 | HR 52 | Ht 63.0 in | Wt 210.0 lb

## 2016-04-03 DIAGNOSIS — I10 Essential (primary) hypertension: Secondary | ICD-10-CM

## 2016-04-03 DIAGNOSIS — I480 Paroxysmal atrial fibrillation: Secondary | ICD-10-CM

## 2016-04-03 DIAGNOSIS — G4733 Obstructive sleep apnea (adult) (pediatric): Secondary | ICD-10-CM | POA: Diagnosis not present

## 2016-04-03 NOTE — Patient Instructions (Signed)
Medication Instructions:  Continue all current medications.  Labwork: none  Testing/Procedures: none  Follow-Up:  Your physician wants you to follow up in:  1 year.  You will receive a reminder letter in the mail one-two months in advance.  If you don't receive a letter, please call our office to schedule the follow up appointment - Dr. Rayann Heman.  Your physician wants you to follow up in: 6 months.  You will receive a reminder letter in the mail one-two months in advance.  If you don't receive a letter, please call our office to schedule the follow up appointment - Dr. Domenic Polite.   Any Other Special Instructions Will Be Listed Below (If Applicable).  If you need a refill on your cardiac medications before your next appointment, please call your pharmacy.

## 2016-04-03 NOTE — Progress Notes (Signed)
PCP: Monico Blitz, MD Primary Cardiologist:  Dr Sherri Rad is a 76 y.o. female who presents today for routine electrophysiology followup.  Since her last visit, the patient reports doing very well.  She has rare palpitations but feels that she is mostly sinus rhythm.  SOB is stable.  She has begun exercising regularly and doing water exercises and feels that she is making improvements with deconditioning. Today, she denies symptoms of palpitations, chest pain,  lower extremity edema, dizziness, presyncope, or syncope.  The patient is otherwise without complaint today.   Past Medical History:  Diagnosis Date  . Allergic rhinitis   . Anxiety   . Atrial flutter ([place])   . COPD (chronic obstructive pulmonary disease) ([place])   . Depression   . Essential hypertension, benign   . Hyperlipidemia   . Lumbar disc disease   . Obesity   . OSA (obstructive sleep apnea)    CPAP  . Paroxysmal atrial fibrillation (HCC)    Element of tachycardia bradycardia syndrome  . Respiratory failure [facility])    Past Surgical History:  Procedure Laterality Date  . ANTERIOR FUSION LUMBAR SPINE    . CARDIOVERSION N/A 03/28/2014   Procedure: CARDIOVERSION;  Surgeon: Fay Records, MD;  Location: [facility];  Service: Cardiovascular;  Laterality: N/A;  . CATARACT EXTRACTION    . COLONOSCOPY N/A 08/04/2012   Procedure: COLONOSCOPY;  Surgeon: Rogene Houston, MD;  [facility];  Service: Endoscopy;  Laterality: N/A;  730-rescheduled to [place] notified pt  . ELECTROPHYSIOLOGIC STUDY N/A 09/18/2014   Procedure: Atrial Fibrillation Ablation;  Surgeon: Thompson Grayer, MD;  Location: [place] CV LAB;  Service: Cardiovascular;  Laterality: N/A;  . LASIK     Both eyes  . TEE WITHOUT CARDIOVERSION N/A 09/18/2014   Procedure: TRANSESOPHAGEAL ECHOCARDIOGRAM (TEE);  Surgeon: Larey Dresser, MD;  Location: [place];  Service: Cardiovascular;  Laterality: N/A;  . TOTAL HIP ARTHROPLASTY  2010   Right     ROS- all systems are reviewed and negatives except as per HPI above  Current Outpatient Prescriptions  Medication Sig Dispense Refill  . antiseptic oral rinse (BIOTENE) LIQD 15 mLs by Mouth Rinse route daily as needed (Dry Mouth).     Marland Kitchen atorvastatin (LIPITOR) 10 MG tablet Take 10 mg by mouth daily.    . baclofen (LIORESAL) 10 MG tablet Take 10 mg by mouth as needed for muscle spasms.    . Biotin 1000 MCG tablet Take 1,000 mcg by mouth daily.     . budesonide (PULMICORT) 0.5 MG/2ML nebulizer solution Take 0.5 mg by nebulization 2 (two) times daily.     Marland Kitchen CALCIUM-MAGNESIUM-ZINC PO Take 1 capsule by mouth 2 (two) times daily.     Marland Kitchen diltiazem (CARDIZEM) 120 MG tablet Take 1 tablet (120 mg total) by mouth daily. (Patient taking differently: Take 60 mg by mouth 2 (two) times daily. ) 180 tablet 3  . DilTIAZem HCl Coated Beads (DILTIAZEM CD PO) Take 30 mg by mouth as needed. As needed    . docusate-casanthranol (PERICOLACE) 100-30 MG per capsule Take 1 capsule by mouth daily as needed for constipation.     Marland Kitchen erythromycin ethylsuccinate (EES) 400 MG tablet Take 800 mg by mouth once as needed (Take one hour prior to dental appts.).    Marland Kitchen esomeprazole (NEXIUM) 40 MG capsule Take 40 mg by mouth daily.     . flecainide (TAMBOCOR) 100 MG tablet Take 1 tablet (100 mg total) by  mouth 2 (two) times daily. 180 tablet 1  . Fluticasone Propionate (FLONASE NA) Place into the nose 2 (two) times daily before a meal.    . Glycopyrrolate-Formoterol (BEVESPI IN) Inhale into the lungs.    Marland Kitchen Lifitegrast (XIIDRA) 5 % SOLN Place 1 drop into both eyes 2 (two) times daily.    . montelukast (SINGULAIR) 10 MG tablet Take 10 mg by mouth at bedtime.    . valsartan-hydrochlorothiazide (DIOVAN-HCT) 320-25 MG per tablet Take 1 tablet by mouth daily. 30 tablet 6  . warfarin (COUMADIN) 5 MG tablet Take 1 tablet (5 mg total) by mouth daily. (Patient taking differently: Take 5 mg by mouth daily. Daily except Monday 2.5 mg) 45  tablet 3   No current facility-administered medications for this visit.     Physical Exam: Vitals:   04/03/16 0850  BP: 121/67  Pulse: (!) 52  SpO2: 97%  Weight: 210 lb (95.3 kg)  Height: 5\' 3"  (1.6 m)    GEN- The patient is overweight appearing, alert and oriented x 3 today.   Head- normocephalic, atraumatic Eyes-  Sclera clear, conjunctiva pink Ears- hearing intact Oropharynx- clear Lungs- Clear to ausculation bilaterally, normal work of breathing Heart- Regular rate and rhythm with ectopy, no murmurs, rubs or gallops, PMI not laterally displaced GI- soft, NT, ND, + BS Extremities- no clubbing, cyanosis, or edema + calf atrophy bilaterally  ekg today reveals sinus rhythm with frequent PACs  Assessment and Plan: 1.  Paroxysmal atrial fibrillation and atrial flutter Doing well at this time She has mostly pacs, not much afib.  I have offered ILR to better characterize her AF burden which she declines. Continue current medical therapy  2. Obesity Regular exercise and weight reduction are essential to maintaining sinus long term  3. OSA Compliance with CPAP is encouraged  4. HTN Stable No change required today  Return to see me in 12 months Follow-up with Dr Domenic Polite in 6 months  Thompson Grayer MD, [facility] 04/03/2016 9:11 AM

## 2016-04-10 DIAGNOSIS — M545 Low back pain: Secondary | ICD-10-CM | POA: Diagnosis not present

## 2016-04-10 DIAGNOSIS — M546 Pain in thoracic spine: Secondary | ICD-10-CM | POA: Diagnosis not present

## 2016-04-10 DIAGNOSIS — M542 Cervicalgia: Secondary | ICD-10-CM | POA: Diagnosis not present

## 2016-04-10 DIAGNOSIS — M9901 Segmental and somatic dysfunction of cervical region: Secondary | ICD-10-CM | POA: Diagnosis not present

## 2016-04-10 DIAGNOSIS — M9902 Segmental and somatic dysfunction of thoracic region: Secondary | ICD-10-CM | POA: Diagnosis not present

## 2016-04-10 DIAGNOSIS — M9903 Segmental and somatic dysfunction of lumbar region: Secondary | ICD-10-CM | POA: Diagnosis not present

## 2016-04-23 DIAGNOSIS — M545 Low back pain: Secondary | ICD-10-CM | POA: Diagnosis not present

## 2016-04-23 DIAGNOSIS — M9903 Segmental and somatic dysfunction of lumbar region: Secondary | ICD-10-CM | POA: Diagnosis not present

## 2016-04-23 DIAGNOSIS — M542 Cervicalgia: Secondary | ICD-10-CM | POA: Diagnosis not present

## 2016-04-23 DIAGNOSIS — M9902 Segmental and somatic dysfunction of thoracic region: Secondary | ICD-10-CM | POA: Diagnosis not present

## 2016-04-23 DIAGNOSIS — M546 Pain in thoracic spine: Secondary | ICD-10-CM | POA: Diagnosis not present

## 2016-04-23 DIAGNOSIS — M9901 Segmental and somatic dysfunction of cervical region: Secondary | ICD-10-CM | POA: Diagnosis not present

## 2016-04-28 DIAGNOSIS — K219 Gastro-esophageal reflux disease without esophagitis: Secondary | ICD-10-CM | POA: Diagnosis not present

## 2016-04-28 DIAGNOSIS — Z713 Dietary counseling and surveillance: Secondary | ICD-10-CM | POA: Diagnosis not present

## 2016-04-28 DIAGNOSIS — I10 Essential (primary) hypertension: Secondary | ICD-10-CM | POA: Diagnosis not present

## 2016-04-28 DIAGNOSIS — J449 Chronic obstructive pulmonary disease, unspecified: Secondary | ICD-10-CM | POA: Diagnosis not present

## 2016-04-28 DIAGNOSIS — Z299 Encounter for prophylactic measures, unspecified: Secondary | ICD-10-CM | POA: Diagnosis not present

## 2016-04-28 DIAGNOSIS — E7800 Pure hypercholesterolemia, unspecified: Secondary | ICD-10-CM | POA: Diagnosis not present

## 2016-04-28 DIAGNOSIS — I4891 Unspecified atrial fibrillation: Secondary | ICD-10-CM | POA: Diagnosis not present

## 2016-04-28 DIAGNOSIS — Z6838 Body mass index (BMI) 38.0-38.9, adult: Secondary | ICD-10-CM | POA: Diagnosis not present

## 2016-04-28 DIAGNOSIS — J441 Chronic obstructive pulmonary disease with (acute) exacerbation: Secondary | ICD-10-CM | POA: Diagnosis not present

## 2016-05-01 ENCOUNTER — Encounter (INDEPENDENT_AMBULATORY_CARE_PROVIDER_SITE_OTHER): Payer: Self-pay | Admitting: *Deleted

## 2016-05-05 DIAGNOSIS — Z713 Dietary counseling and surveillance: Secondary | ICD-10-CM | POA: Diagnosis not present

## 2016-05-05 DIAGNOSIS — R110 Nausea: Secondary | ICD-10-CM | POA: Diagnosis not present

## 2016-05-05 DIAGNOSIS — Z299 Encounter for prophylactic measures, unspecified: Secondary | ICD-10-CM | POA: Diagnosis not present

## 2016-05-05 DIAGNOSIS — Z789 Other specified health status: Secondary | ICD-10-CM | POA: Diagnosis not present

## 2016-05-05 DIAGNOSIS — Z6838 Body mass index (BMI) 38.0-38.9, adult: Secondary | ICD-10-CM | POA: Diagnosis not present

## 2016-05-05 DIAGNOSIS — J449 Chronic obstructive pulmonary disease, unspecified: Secondary | ICD-10-CM | POA: Diagnosis not present

## 2016-05-06 ENCOUNTER — Ambulatory Visit (HOSPITAL_COMMUNITY): Admit: 2016-05-06 | Payer: Medicare Other | Admitting: Internal Medicine

## 2016-05-06 ENCOUNTER — Encounter (HOSPITAL_COMMUNITY): Payer: Self-pay

## 2016-05-06 SURGERY — EGD (ESOPHAGOGASTRODUODENOSCOPY)
Anesthesia: Moderate Sedation

## 2016-05-13 DIAGNOSIS — Z299 Encounter for prophylactic measures, unspecified: Secondary | ICD-10-CM | POA: Diagnosis not present

## 2016-05-13 DIAGNOSIS — R001 Bradycardia, unspecified: Secondary | ICD-10-CM | POA: Diagnosis not present

## 2016-05-13 DIAGNOSIS — I4891 Unspecified atrial fibrillation: Secondary | ICD-10-CM | POA: Diagnosis not present

## 2016-05-13 DIAGNOSIS — Z6837 Body mass index (BMI) 37.0-37.9, adult: Secondary | ICD-10-CM | POA: Diagnosis not present

## 2016-05-13 DIAGNOSIS — J449 Chronic obstructive pulmonary disease, unspecified: Secondary | ICD-10-CM | POA: Diagnosis not present

## 2016-05-22 DIAGNOSIS — M546 Pain in thoracic spine: Secondary | ICD-10-CM | POA: Diagnosis not present

## 2016-05-22 DIAGNOSIS — M9902 Segmental and somatic dysfunction of thoracic region: Secondary | ICD-10-CM | POA: Diagnosis not present

## 2016-05-22 DIAGNOSIS — M9901 Segmental and somatic dysfunction of cervical region: Secondary | ICD-10-CM | POA: Diagnosis not present

## 2016-05-22 DIAGNOSIS — M542 Cervicalgia: Secondary | ICD-10-CM | POA: Diagnosis not present

## 2016-05-22 DIAGNOSIS — M545 Low back pain: Secondary | ICD-10-CM | POA: Diagnosis not present

## 2016-05-22 DIAGNOSIS — M9903 Segmental and somatic dysfunction of lumbar region: Secondary | ICD-10-CM | POA: Diagnosis not present

## 2016-05-25 DIAGNOSIS — M9902 Segmental and somatic dysfunction of thoracic region: Secondary | ICD-10-CM | POA: Diagnosis not present

## 2016-05-25 DIAGNOSIS — M546 Pain in thoracic spine: Secondary | ICD-10-CM | POA: Diagnosis not present

## 2016-05-25 DIAGNOSIS — M9903 Segmental and somatic dysfunction of lumbar region: Secondary | ICD-10-CM | POA: Diagnosis not present

## 2016-05-25 DIAGNOSIS — M542 Cervicalgia: Secondary | ICD-10-CM | POA: Diagnosis not present

## 2016-05-25 DIAGNOSIS — M545 Low back pain: Secondary | ICD-10-CM | POA: Diagnosis not present

## 2016-05-25 DIAGNOSIS — M9901 Segmental and somatic dysfunction of cervical region: Secondary | ICD-10-CM | POA: Diagnosis not present

## 2016-05-26 DIAGNOSIS — J449 Chronic obstructive pulmonary disease, unspecified: Secondary | ICD-10-CM | POA: Diagnosis not present

## 2016-05-26 DIAGNOSIS — Z713 Dietary counseling and surveillance: Secondary | ICD-10-CM | POA: Diagnosis not present

## 2016-05-26 DIAGNOSIS — Z6837 Body mass index (BMI) 37.0-37.9, adult: Secondary | ICD-10-CM | POA: Diagnosis not present

## 2016-05-26 DIAGNOSIS — I4891 Unspecified atrial fibrillation: Secondary | ICD-10-CM | POA: Diagnosis not present

## 2016-05-26 DIAGNOSIS — Z299 Encounter for prophylactic measures, unspecified: Secondary | ICD-10-CM | POA: Diagnosis not present

## 2016-05-27 ENCOUNTER — Ambulatory Visit (INDEPENDENT_AMBULATORY_CARE_PROVIDER_SITE_OTHER): Payer: Medicare Other | Admitting: Cardiology

## 2016-05-27 ENCOUNTER — Encounter: Payer: Self-pay | Admitting: Cardiology

## 2016-05-27 ENCOUNTER — Other Ambulatory Visit (HOSPITAL_COMMUNITY)
Admission: RE | Admit: 2016-05-27 | Discharge: 2016-05-27 | Disposition: A | Discharge status: Home / Self Care | Payer: Medicare Other | Source: Ambulatory Visit | Attending: Cardiology | Admitting: Cardiology

## 2016-05-27 VITALS — BP 118/68 | HR 113 | Ht 63.5 in | Wt 204.0 lb

## 2016-05-27 DIAGNOSIS — Z79899 Other long term (current) drug therapy: Secondary | ICD-10-CM

## 2016-05-27 DIAGNOSIS — I480 Paroxysmal atrial fibrillation: Secondary | ICD-10-CM

## 2016-05-27 DIAGNOSIS — I495 Sick sinus syndrome: Secondary | ICD-10-CM

## 2016-05-27 DIAGNOSIS — I10 Essential (primary) hypertension: Secondary | ICD-10-CM

## 2016-05-27 DIAGNOSIS — G4733 Obstructive sleep apnea (adult) (pediatric): Secondary | ICD-10-CM

## 2016-05-27 MED ORDER — DILTIAZEM HCL 30 MG PO TABS: 30.0000 mg | ORAL_TABLET | Freq: Three times a day (TID) | ORAL | 3 refills | Status: DC

## 2016-05-27 NOTE — Patient Instructions (Addendum)
Medication Instructions:  START DILTIAZEM 30 MG , THREE TIMES DAILY  FOR NEXT 2 DAYS- TAKE AN EXTRA FLECAINIDE- THEN RETURN TO NORMAL DOSE  Labwork: FLECAINIDE LEVEL  Testing/Procedures: Your physician has recommended that you wear an event monitor. Event monitors are medical devices that record the heart's electrical activity. Doctors most often [place] these monitors to diagnose arrhythmias. Arrhythmias are problems with the speed or rhythm of the heartbeat. The monitor is a small, portable device. You can wear one while you do your normal daily activities. This is usually used to diagnose what is causing palpitations/syncope (passing out).    Follow-Up: Your physician recommends that you schedule a follow-up appointment in: 1 MONTH ([place])    Any Other Special Instructions Will Be Listed Below (If Applicable).     If you need a refill on your cardiac medications before your next appointment, please call your pharmacy.

## 2016-05-27 NOTE — Progress Notes (Signed)
Cardiology Office Note  Date: 05/27/2016   ID: [name] [name], DOB [birth year], MRN [id number]  PCP: Monico Blitz, MD  Primary Cardiologist: Rozann Lesches, MD   Chief Complaint  Patient presents with  . PAF    History of Present Illness: [name] [name] is a 76 y.o. female that I last saw in April 2017. She has had interval follow-up in the atrial fibrillation clinic and with Dr. Rayann Heman, just recently seen in January. She has a history of atrial fibrillation ablation in June 2016, has been on flecainide since that time with recurring PAF. She reports more frequent episodes of palpitations in general over the last several months, but has overall done fairly well on current regimen. Within the last few weeks she has noticed periods of bradycardia with heart rate down to the mid 40s, this led to a reduction in her Cardizem CD dose per Dr. Manuella Ghazi and ultimately conversion to short acting diltiazem which she is taking at this time. She has reverted to atrial fibrillation and seems to have symptoms with both bradycardia and atrial fibrillation. This is also compounded by chronic lung disease and sleep apnea. She has had no syncope or chest pain.  Today we discussed her medications. She reports compliance. Coumadin is followed by Dr. Manuella Ghazi. No bleeding problems. I reviewed her recent ECG from 05/26/2016 which showed atrial fibrillation with poor R-wave progression and left anterior fascicular block, heart rate 82.  Past Medical History:  Diagnosis Date  . Allergic rhinitis   . Anxiety   . Atrial flutter ([place])   . COPD (chronic obstructive pulmonary disease) ([place])   . Depression   . Essential hypertension, benign   . Hyperlipidemia   . Lumbar disc disease   . Obesity   . OSA (obstructive sleep apnea)    CPAP  . Paroxysmal atrial fibrillation (HCC)    Element of tachycardia bradycardia syndrome  . Respiratory failure [facility])     Past Surgical History:  Procedure Laterality Date  .  ANTERIOR FUSION LUMBAR SPINE    . CARDIOVERSION N/A 03/28/2014   Procedure: CARDIOVERSION;  Surgeon: Fay Records, MD;  Location: [facility] ORS;  Service: Cardiovascular;  Laterality: N/A;  . CATARACT EXTRACTION    . COLONOSCOPY N/A 08/04/2012   Procedure: COLONOSCOPY;  Surgeon: Rogene Houston, MD;  Location: [facility];  Service: Endoscopy;  Laterality: N/A;  730-rescheduled to [place] notified pt  . ELECTROPHYSIOLOGIC STUDY N/A 09/18/2014   Procedure: Atrial Fibrillation Ablation;  Surgeon: Thompson Grayer, MD;  Location: [facility] LAB;  Service: Cardiovascular;  Laterality: N/A;  . LASIK     Both eyes  . TEE WITHOUT CARDIOVERSION N/A 09/18/2014   Procedure: TRANSESOPHAGEAL ECHOCARDIOGRAM (TEE);  Surgeon: Larey Dresser, MD;  Location: [facility];  Service: Cardiovascular;  Laterality: N/A;  . TOTAL HIP ARTHROPLASTY  2010   Right    Current Outpatient Prescriptions  Medication Sig Dispense Refill  . acetaminophen (TYLENOL) 500 MG tablet Take 500 mg by mouth daily as needed for headache.    Marland Kitchen antiseptic oral rinse (BIOTENE) LIQD 15 mLs by Mouth Rinse route daily as needed (Dry Mouth).     Marland Kitchen atorvastatin (LIPITOR) 10 MG tablet Take 10 mg by mouth every evening.     . baclofen (LIORESAL) 10 MG tablet Take 10 mg by mouth as needed for muscle spasms.    . Biotin 1000 MCG tablet Take 1,000 mcg by mouth daily.     . budesonide (PULMICORT) 0.5 MG/2ML  nebulizer solution Take 0.5 mg by nebulization 2 (two) times daily.     . cetirizine (ZYRTEC) 10 MG tablet Take 10 mg by mouth daily as needed for allergies.    Marland Kitchen diltiazem (CARDIZEM) 30 MG tablet Take 1 tablet (30 mg total) by mouth 3 (three) times daily. 90 tablet 3  . docusate-casanthranol (PERICOLACE) 100-30 MG per capsule Take 1 capsule by mouth daily as needed for constipation.     Marland Kitchen erythromycin ethylsuccinate (EES) 400 MG tablet Take 1,600 mg by mouth once as needed (Take one hour prior to dental appts.).     Marland Kitchen esomeprazole (NEXIUM) 40 MG  capsule Take 40 mg by mouth daily.     . flecainide (TAMBOCOR) 100 MG tablet Take 1 tablet (100 mg total) by mouth 2 (two) times daily. 180 tablet 1  . fluticasone (FLONASE) 50 MCG/ACT nasal spray Place 1 spray into both nostrils 2 (two) times daily.    Marland Kitchen levalbuterol (XOPENEX) 1.25 MG/3ML nebulizer solution Take 1.25 mg by nebulization 4 (four) times daily.    Marland Kitchen levofloxacin (LEVAQUIN) 750 MG tablet Take 750 mg by mouth daily.    Marland Kitchen Lifitegrast (XIIDRA) 5 % SOLN Place 1 drop into both eyes 2 (two) times daily.    . montelukast (SINGULAIR) 10 MG tablet Take 10 mg by mouth at bedtime.    . Multiple Minerals-Vitamins (CALCIUM-MAGNESIUM-ZINC-D3) TABS Take 1 tablet by mouth 2 (two) times daily.    . predniSONE (DELTASONE) 5 MG tablet Take 5-30 mg by mouth as directed. Take 30mg s on day one then reduce by 5mg s daily until gone    . sodium chloride (OCEAN) 0.65 % SOLN nasal spray Place 1 spray into both nostrils as needed for congestion.    . valsartan-hydrochlorothiazide (DIOVAN-HCT) 320-25 MG per tablet Take 1 tablet by mouth daily. (Patient taking differently: Take 1 tablet by mouth every evening. ) 30 tablet 6  . warfarin (COUMADIN) 5 MG tablet Take 1 tablet (5 mg total) by mouth daily. (Patient taking differently: Take 5 mg by mouth daily. Daily except Monday 2.5 mg) 45 tablet 3   No current facility-administered medications for this visit.    Allergies:  Albuterol; Bupropion hcl; Escitalopram oxalate; Fluticasone-salmeterol; Lisinopril; Serevent; Macrodantin [nitrofurantoin]; and Penicillins   Social History: The patient  reports that she has never smoked. She has never used smokeless tobacco. She reports that she does not drink alcohol or use drugs.   ROS:  Please see the history of present illness. Otherwise, complete review of systems is positive for fatigue.  All other systems are reviewed and negative.   Physical Exam: VS:  BP 118/68   Pulse (!) 113   Ht 5' 3.5" (1.613 m)   Wt 204 lb  (92.5 kg)   SpO2 98%   BMI 35.57 kg/m , BMI Body mass index is 35.57 kg/m.  Wt Readings from Last 3 Encounters:  05/27/16 204 lb (92.5 kg)  04/03/16 210 lb (95.3 kg)  03/04/16 210 lb 11.2 oz (95.6 kg)    Overweight woman in no acute distress.  HEENT: Conjunctiva and lids normal, oropharynx clear.  Neck: Supple, no elevated JVP or bruits.  Lungs: Coarse breath sounds without wheezing, diminished, nonlabored.  Cardiac: Irregularly irregular without gallop, distant heart sounds, no significant systolic murmur.  Abdomen: Soft, nontender, bowel sounds present.  Extremities: No pitting edema.  Skin: Warm and dry. Musculoskeletal: No kyphosis. Neuropsychiatric: Alert and oriented 3, affect appropriate.  ECG: I personally reviewed the tracing from 04/03/2016 which showed possible  slow atrial fibrillation with IVCD.  Recent Labwork: 08/16/2015: Hemoglobin 11.6; Platelets 198.0   Other Studies Reviewed Today:  TEE 09/18/2014: Study Conclusions  - Left ventricle: The cavity size was normal. Wall thickness was   normal. Systolic function was normal. The estimated ejection   fraction was in the range of 60% to 65%. Wall motion was normal;   there were no regional wall motion abnormalities. - Aortic valve: There was no stenosis. - Aorta: Normal caliber, minimal plaque. - Mitral valve: There was mild regurgitation. - Left atrium: The atrium was moderately dilated. No evidence of   thrombus in the atrial cavity or appendage. No evidence of   thrombus in the atrial cavity or appendage. - Right ventricle: The cavity size was normal. Systolic function   was normal. - Right atrium: The atrium was moderately dilated. - Atrial septum: No defect or patent foramen ovale was identified.   Echo contrast study showed no right-to-left atrial level shunt,   at baseline or with provocation. - Tricuspid valve: Peak RV-RA gradient 33 mmHg.  Assessment and Plan:  1. Paroxysmal atrial  fibrillation with recurrences. She is status post atrial fibrillation ablation in 2016, continues on Coumadin for stroke prophylaxis, also flecainide for rhythm control with diltiazem for rate control. She has symptoms both associated with atrial fibrillation and also bradycardia. At this time I have asked her to take an additional flecainide tablet for the next few days to hopefully help her to convert. Also check flecainide level. She will use Cardizem 30 mg 3 times a day for now and we can adjust this as needed.  2. Tachycardia-bradycardia syndrome. Potentially progressing in light of heart rates in the 40s at times. Plan to obtain a seven-day heart monitor to investigate rhythm burden and bradycardia. She already follows with Dr. Rayann Heman, may ultimately need a pacemaker.  3. Obstructive sleep apnea, compliant with CPAP.  4. Essential hypertension, blood pressure is well controlled today.  Current medicines were reviewed with the patient today.   Orders Placed This Encounter  Procedures  . Flecainide level  . Cardiac event monitor  . EKG 12-Lead    Disposition: Follow-up with Dr. Manuella Ghazi in 2 weeks as scheduled, then with me 2 weeks thereafter.  Signed, Satira Sark, MD, [facility] 05/27/2016 2:33 PM    [place] at [facility] [address], [place], [place] [address] Phone: [phone]; Fax: [phone]

## 2016-05-28 LAB — FLECAINIDE LEVEL: Flecainide: 0.48 ug/mL (ref 0.20–1.00)

## 2016-06-04 ENCOUNTER — Emergency Department (HOSPITAL_COMMUNITY): Payer: Medicare Other

## 2016-06-04 ENCOUNTER — Emergency Department (HOSPITAL_COMMUNITY)
Admission: EM | Admit: 2016-06-04 | Discharge: 2016-06-04 | Disposition: A | Discharge status: Home / Self Care | Payer: Medicare Other | Attending: Emergency Medicine | Admitting: Emergency Medicine

## 2016-06-04 ENCOUNTER — Encounter (HOSPITAL_COMMUNITY): Payer: Self-pay | Admitting: Emergency Medicine

## 2016-06-04 ENCOUNTER — Encounter: Payer: Self-pay | Admitting: Cardiology

## 2016-06-04 ENCOUNTER — Ambulatory Visit (INDEPENDENT_AMBULATORY_CARE_PROVIDER_SITE_OTHER): Payer: Medicare Other | Admitting: Cardiology

## 2016-06-04 VITALS — BP 124/68 | HR 72 | Ht 63.0 in | Wt 206.0 lb

## 2016-06-04 DIAGNOSIS — E782 Mixed hyperlipidemia: Secondary | ICD-10-CM

## 2016-06-04 DIAGNOSIS — I480 Paroxysmal atrial fibrillation: Secondary | ICD-10-CM

## 2016-06-04 DIAGNOSIS — M545 Low back pain: Secondary | ICD-10-CM | POA: Diagnosis not present

## 2016-06-04 DIAGNOSIS — R0602 Shortness of breath: Secondary | ICD-10-CM

## 2016-06-04 DIAGNOSIS — J449 Chronic obstructive pulmonary disease, unspecified: Secondary | ICD-10-CM | POA: Diagnosis not present

## 2016-06-04 DIAGNOSIS — M9903 Segmental and somatic dysfunction of lumbar region: Secondary | ICD-10-CM | POA: Diagnosis not present

## 2016-06-04 DIAGNOSIS — E876 Hypokalemia: Secondary | ICD-10-CM | POA: Diagnosis not present

## 2016-06-04 DIAGNOSIS — I10 Essential (primary) hypertension: Secondary | ICD-10-CM | POA: Insufficient documentation

## 2016-06-04 DIAGNOSIS — J441 Chronic obstructive pulmonary disease with (acute) exacerbation: Secondary | ICD-10-CM | POA: Diagnosis not present

## 2016-06-04 DIAGNOSIS — J4520 Mild intermittent asthma, uncomplicated: Secondary | ICD-10-CM | POA: Diagnosis not present

## 2016-06-04 DIAGNOSIS — M546 Pain in thoracic spine: Secondary | ICD-10-CM | POA: Diagnosis not present

## 2016-06-04 DIAGNOSIS — M9901 Segmental and somatic dysfunction of cervical region: Secondary | ICD-10-CM | POA: Diagnosis not present

## 2016-06-04 DIAGNOSIS — Z79899 Other long term (current) drug therapy: Secondary | ICD-10-CM | POA: Insufficient documentation

## 2016-06-04 DIAGNOSIS — M542 Cervicalgia: Secondary | ICD-10-CM | POA: Diagnosis not present

## 2016-06-04 DIAGNOSIS — M9902 Segmental and somatic dysfunction of thoracic region: Secondary | ICD-10-CM | POA: Diagnosis not present

## 2016-06-04 LAB — CBC WITH DIFFERENTIAL/PLATELET
BASOS PCT: 1 %
Basophils Absolute: 0.1 10*3/uL (ref 0.0–0.1)
EOS ABS: 0.1 10*3/uL (ref 0.0–0.7)
Eosinophils Relative: 1 %
HCT: 34.2 % — ABNORMAL LOW (ref 36.0–46.0)
Hemoglobin: 11.4 g/dL — ABNORMAL LOW (ref 12.0–15.0)
Lymphocytes Relative: 16 %
Lymphs Abs: 1.5 10*3/uL (ref 0.7–4.0)
MCH: 28.4 pg (ref 26.0–34.0)
MCHC: 33.3 g/dL (ref 30.0–36.0)
MCV: 85.3 fL (ref 78.0–100.0)
MONOS PCT: 12 %
Monocytes Absolute: 1.1 10*3/uL — ABNORMAL HIGH (ref 0.1–1.0)
Neutro Abs: 6.4 10*3/uL (ref 1.7–7.7)
Neutrophils Relative %: 70 %
Platelets: 184 10*3/uL (ref 150–400)
RBC: 4.01 MIL/uL (ref 3.87–5.11)
RDW: 14.2 % (ref 11.5–15.5)
WBC: 9.2 10*3/uL (ref 4.0–10.5)

## 2016-06-04 LAB — COMPREHENSIVE METABOLIC PANEL
ALBUMIN: 3.6 g/dL (ref 3.5–5.0)
ALK PHOS: 67 U/L (ref 38–126)
ALT: 16 U/L (ref 14–54)
ANION GAP: 9 (ref 5–15)
AST: 26 U/L (ref 15–41)
BUN: 17 mg/dL (ref 6–20)
CALCIUM: 9.1 mg/dL (ref 8.9–10.3)
CHLORIDE: 98 mmol/L — AB (ref 101–111)
CO2: 26 mmol/L (ref 22–32)
Creatinine, Ser: 0.87 mg/dL (ref 0.44–1.00)
GFR calc Af Amer: >60 mL/min (ref >60)
GFR calc non Af Amer: >60 mL/min (ref >60)
GLUCOSE: 86 mg/dL (ref 65–99)
POTASSIUM: 2.7 mmol/L — AB (ref 3.5–5.1)
Sodium: 133 mmol/L — ABNORMAL LOW (ref 135–145)
Total Bilirubin: 0.5 mg/dL (ref 0.3–1.2)
Total Protein: 6.5 g/dL (ref 6.5–8.1)

## 2016-06-04 LAB — BRAIN NATRIURETIC PEPTIDE: B Natriuretic Peptide: 79 pg/mL (ref 0.0–100.0)

## 2016-06-04 LAB — TROPONIN I: Troponin I: <0.03 ng/mL (ref <0.03)

## 2016-06-04 MED ORDER — POTASSIUM CHLORIDE CRYS ER 20 MEQ PO TBCR
40.0000 meq | EXTENDED_RELEASE_TABLET | Freq: Once | ORAL | Status: AC
  Administered 2016-06-04: 40 meq via ORAL
  Filled 2016-06-04: qty 2

## 2016-06-04 MED ORDER — POTASSIUM CHLORIDE ER 10 MEQ PO TBCR: 10.0000 meq | EXTENDED_RELEASE_TABLET | Freq: Every day | ORAL | 0 refills | Status: DC

## 2016-06-04 MED ORDER — IPRATROPIUM-ALBUTEROL 0.5-2.5 (3) MG/3ML IN SOLN
3.0000 mL | Freq: Once | RESPIRATORY_TRACT | Status: AC
  Administered 2016-06-04: 3 mL via RESPIRATORY_TRACT
  Filled 2016-06-04: qty 3

## 2016-06-04 MED ORDER — MAGNESIUM SULFATE 2 GM/50ML IV SOLN
2.0000 g | INTRAVENOUS | Status: AC
  Administered 2016-06-04: 2 g via INTRAVENOUS
  Filled 2016-06-04: qty 50

## 2016-06-04 NOTE — Patient Instructions (Signed)
Your physician recommends that you schedule a follow-up appointment in: as planned 07/07/16 in [place] with Dr Domenic Polite    I will take you to the ED for evaluation of your shortness of breath, I have spoken with the ED, they are expecting you        Thank you for choosing [facility] !

## 2016-06-04 NOTE — Progress Notes (Signed)
Cardiology Office Note  Date: 06/04/2016   ID: [name] [name], DOB Oct 08, 1940, MRN [id number]  PCP: Monico Blitz, MD  Primary Cardiologist: Rozann Lesches, MD   Chief Complaint  Patient presents with  . Atrial Fibrillation    History of Present Illness: [name] [name] is a 76 y.o. female that I saw just recently on February 28. She walked into the office today complaining of progressive shortness of breath, cough and wheezing - was added to my schedule. She followed medication adjustment recommendations made at the last visit, is taking diltiazem 30 mg 3 times a day along with her flecainide (she did take an extra dose for a few days as I instructed), and Coumadin. She states that her heart rate has generally been in the 60s to 70s over the last week, her home blood pressure cuff has not recorded "irregular." She has had no unusual bradycardia. She states that she has been coughing and progressively short of breath with active wheezing, no fevers or chills. She admits that she has not been using her inhalers as directed because they generally make her feel jittery. She states that she has a visit to see Dr. Manuella Ghazi next week.  I personally reviewed her ECG today which shows sinus bradycardia with poor R wave progression and left anterior fascicular block.  Flecainide level from February was within normal range at 0.48. She has not yet started wearing her outpatient cardiac monitor.  Past Medical History:  Diagnosis Date  . Allergic rhinitis   . Anxiety   . Atrial flutter ([place])   . COPD (chronic obstructive pulmonary disease) ([place])   . Depression   . Essential hypertension, benign   . Hyperlipidemia   . Lumbar disc disease   . Obesity   . OSA (obstructive sleep apnea)    CPAP  . Paroxysmal atrial fibrillation (HCC)    Element of tachycardia bradycardia syndrome  . Respiratory failure [facility])     Past Surgical History:  Procedure Laterality Date  . ANTERIOR FUSION LUMBAR  SPINE    . CARDIOVERSION N/A 03/28/2014   Procedure: CARDIOVERSION;  Surgeon: Fay Records, MD;  Location: [facility];  Service: Cardiovascular;  Laterality: N/A;  . CATARACT EXTRACTION    . COLONOSCOPY N/A 08/04/2012   Procedure: COLONOSCOPY;  Surgeon: Rogene Houston, MD;  [facility];  Service: Endoscopy;  Laterality: N/A;  730-rescheduled to [place] notified pt  . ELECTROPHYSIOLOGIC STUDY N/A 09/18/2014   Procedure: Atrial Fibrillation Ablation;  Surgeon: Thompson Grayer, MD;  Location: [facility] CV LAB;  Service: Cardiovascular;  Laterality: N/A;  . LASIK     Both eyes  . TEE WITHOUT CARDIOVERSION N/A 09/18/2014   Procedure: TRANSESOPHAGEAL ECHOCARDIOGRAM (TEE);  Surgeon: Larey Dresser, MD;  Location: [facility];  Service: Cardiovascular;  Laterality: N/A;  . TOTAL HIP ARTHROPLASTY  2010   Right    Current Outpatient Prescriptions  Medication Sig Dispense Refill  . acetaminophen (TYLENOL) 500 MG tablet Take 500 mg by mouth daily as needed for headache.    Marland Kitchen antiseptic oral rinse (BIOTENE) LIQD 15 mLs by Mouth Rinse route daily as needed (Dry Mouth).     Marland Kitchen atorvastatin (LIPITOR) 10 MG tablet Take 10 mg by mouth every evening.     . baclofen (LIORESAL) 10 MG tablet Take 10 mg by mouth as needed for muscle spasms.    . Biotin 1000 MCG tablet Take 1,000 mcg by mouth daily.     . budesonide (PULMICORT) 0.5  MG/2ML nebulizer solution Take 0.5 mg by nebulization 2 (two) times daily.     . cetirizine (ZYRTEC) 10 MG tablet Take 10 mg by mouth daily as needed for allergies.    Marland Kitchen diltiazem (CARDIZEM) 30 MG tablet Take 1 tablet (30 mg total) by mouth 3 (three) times daily. 90 tablet 3  . docusate-casanthranol (PERICOLACE) 100-30 MG per capsule Take 1 capsule by mouth daily as needed for constipation.     Marland Kitchen erythromycin ethylsuccinate (EES) 400 MG tablet Take 1,600 mg by mouth once as needed (Take one hour prior to dental appts.).     Marland Kitchen esomeprazole (NEXIUM) 40 MG capsule Take 40 mg by  mouth daily.     . flecainide (TAMBOCOR) 100 MG tablet Take 1 tablet (100 mg total) by mouth 2 (two) times daily. 180 tablet 1  . fluticasone (FLONASE) 50 MCG/ACT nasal spray Place 1 spray into both nostrils 2 (two) times daily.    Marland Kitchen levalbuterol (XOPENEX) 1.25 MG/3ML nebulizer solution Take 1.25 mg by nebulization 4 (four) times daily.    Marland Kitchen levofloxacin (LEVAQUIN) 750 MG tablet Take 750 mg by mouth daily.    Marland Kitchen Lifitegrast (XIIDRA) 5 % SOLN Place 1 drop into both eyes 2 (two) times daily.    . montelukast (SINGULAIR) 10 MG tablet Take 10 mg by mouth at bedtime.    . Multiple Minerals-Vitamins (CALCIUM-MAGNESIUM-ZINC-D3) TABS Take 1 tablet by mouth 2 (two) times daily.    . predniSONE (DELTASONE) 5 MG tablet Take 5-30 mg by mouth as directed. Take 30mg s on day one then reduce by 5mg s daily until gone    . sodium chloride (OCEAN) 0.65 % SOLN nasal spray Place 1 spray into both nostrils as needed for congestion.    . valsartan-hydrochlorothiazide (DIOVAN-HCT) 320-25 MG per tablet Take 1 tablet by mouth daily. (Patient taking differently: Take 1 tablet by mouth every evening. ) 30 tablet 6  . warfarin (COUMADIN) 5 MG tablet Take 1 tablet (5 mg total) by mouth daily. (Patient taking differently: Take 5 mg by mouth daily. Daily except Monday 2.5 mg) 45 tablet 3   No current facility-administered medications for this visit.    Allergies:  Albuterol; Bupropion hcl; Escitalopram oxalate; Fluticasone-salmeterol; Lisinopril; Serevent; Macrodantin [nitrofurantoin]; and Penicillins   Social History: The patient  reports that she has never smoked. She has never used smokeless tobacco. She reports that she does not drink alcohol or use drugs.   ROS:  Please see the history of present illness. Otherwise, complete review of systems is positive for shortness of breath and wheezing.  All other systems are reviewed and negative.   Physical Exam: VS:  BP 124/68   Pulse 72   Ht 5\' 3"  (1.6 m)   Wt 206 lb (93.4 kg)    SpO2 94%   BMI 36.49 kg/m , BMI Body mass index is 36.49 kg/m.  Wt Readings from Last 3 Encounters:  06/04/16 206 lb (93.4 kg)  05/27/16 204 lb (92.5 kg)  04/03/16 210 lb (95.3 kg)    Overweight woman, moderately short of breath and wheezing.  HEENT: Conjunctiva and lids normal, oropharynx clear.  Neck: Supple, no elevated JVP or bruits.  Lungs: Decreased breath sounds with prolonged expiratory phase and wheezing noted in the upper and mid lung zones, mildly to moderately labored.  Cardiac: RRR without gallop, distant heart sounds, no significant systolic murmur.  Abdomen: Soft, nontender, bowel sounds present.  Extremities: No pitting edema.  Skin: Warm and dry. Musculoskeletal: No kyphosis. Neuropsychiatric: Alert  and oriented 3, affect appropriate.  ECG: I personally reviewed the tracing from 04/03/2016 which showed possible slow atrial fibrillation with IVCD.  Recent Labwork: 08/16/2015: Hemoglobin 11.6; Platelets 198.0   Other Studies Reviewed Today:  TEE 09/18/2014: Study Conclusions  - Left ventricle: The cavity size was normal. Wall thickness was normal. Systolic function was normal. The estimated ejection fraction was in the range of 60% to 65%. Wall motion was normal; there were no regional wall motion abnormalities. - Aortic valve: There was no stenosis. - Aorta: Normal caliber, minimal plaque. - Mitral valve: There was mild regurgitation. - Left atrium: The atrium was moderately dilated. No evidence of thrombus in the atrial cavity or appendage. No evidence of thrombus in the atrial cavity or appendage. - Right ventricle: The cavity size was normal. Systolic function was normal. - Right atrium: The atrium was moderately dilated. - Atrial septum: No defect or patent foramen ovale was identified. Echo contrast study showed no right-to-left atrial level shunt, at baseline or with provocation. - Tricuspid valve: Peak RV-RA gradient 33  mmHg.  Assessment and Plan:  1. Patient presented with shortness of breath and wheezing, looks very much to be pulmonary in etiology with bronchospasm. She has known COPD and has not been using her inhalers regularly. She is noted to be in sinus rhythm today in the presence of these symptoms. Recommending that she be seen in the ER for chest x-ray and breathing treatments, may need to be put on a course of steroids. Hopefully she can be discharged home and keep her follow-up visit with Dr. Manuella Ghazi scheduled for next week.  2. Paroxysmal atrial fibrillation with history of symptomatic bradycardia. She is in sinus rhythm today and we will continue with her current cardiac regimen. Outpatient cardiac monitor is also planned to assess heart rate variability and degree of bradycardia. She has follow-up with [place] already scheduled.  3. Essential hypertension, blood pressure is adequately controlled today.  4. Hyperlipidemia, on Lipitor.  Current medicines were reviewed with the patient today.   Orders Placed This Encounter  Procedures  . EKG 12-Lead    Disposition: Patient going to ER from our office. Keep scheduled follow-up.  Signed, Satira Sark, MD, [facility] 06/04/2016 3:09 PM    [facility] at [facility] [address], [place], [place] [address] Phone: [phone]; Fax: [phone]

## 2016-06-04 NOTE — ED Notes (Signed)
CRITICAL VALUE ALERT  Critical value received:  Potassium 2.7  Date of notification:  06/04/16  Time of notification:  5638  Critical value read back:YES  Nurse who received alert:  Lavenia Atlas, RN  MD notified made aware:  Dr. Sabra Heck at [phone]

## 2016-06-04 NOTE — ED Triage Notes (Signed)
PT states SOB on exertion with generalized weakness that started x2 days ago. PT states she was seen at Dr. Domenic Polite this pm and was brought to ED by their staff due to SOB on exertion and eval.

## 2016-06-04 NOTE — Discharge Instructions (Signed)
If you have worsening shortness of breath, return to the ER immediately Your testing here revealed no acute findings  Potassium once daily for 7 days  Have your doctor recheck your potassium level in 1 week.

## 2016-06-04 NOTE — ED Provider Notes (Signed)
[facility] DEPT Provider Note   CSN: [id number] Arrival date & time: 06/04/16  1514     History   Chief Complaint Chief Complaint  Patient presents with  . Shortness of Breath    HPI [name] [name] is a 76 y.o. female.  HPI  The patient is a 76 year old female, she is obese, has a history of some type of reactive airway disease though she is unsure what it is. She has been intermittently labeled with COPD, she has a history of hypertension, hyperlipidemia and atrial fibrillation intermittently. She reports that she was in atrial fibrillation last week. She was to be placed on a Holter monitor which came in the mail today. Because over the last 3 days she has had some increasing shortness of breath she went to her cardiologist's office today who felt like this was more of a pulmonary issue and sent her to the hospital for chest x-ray and a DuoNeb. The patient reports that she is shortness of breath constantly, she states that it is worse with exertion however she has been able to go to the gym each of the last 3 days in work in the pool doing laps, she has been able to right upper, and bike as well as a Metallurgist and states that though she is slightly increased short of breath she is able to perform these functions. There is no productive cough, no fever, no swelling of the legs. At this point the patient is unsure why she has the shortness of breath but it is been intermittent over the last year and she does not have an answer for this problem yet. She reports that she has been seen by cardiology in the past for her atrial fibrillation but has never been diagnosed with obstructive coronary disease and denies having a heart catheterization other than for her atrial fibrillation that she is aware of.  Past Medical History:  Diagnosis Date  . Allergic rhinitis   . Anxiety   . Atrial flutter ([employer])   . COPD (chronic obstructive pulmonary disease) ([place])   . Depression   . Essential  hypertension, benign   . Hyperlipidemia   . Lumbar disc disease   . Obesity   . OSA (obstructive sleep apnea)    CPAP  . Paroxysmal atrial fibrillation (HCC)    Element of tachycardia bradycardia syndrome  . Respiratory failure [facility])     Patient Active Problem List   Diagnosis Date Noted  . Gastroesophageal reflux disease without esophagitis 03/04/2016  . A-fib ([place]) 09/18/2014  . Persistent atrial fibrillation ([place])   . Morbid obesity ([place]) 06/21/2013  . Asthma, mild intermittent 10/18/2012  . Seasonal and perennial allergic rhinitis 10/18/2012  . Atrial flutter ([place]) 07/14/2011  . Long term (current) use of anticoagulants 07/03/2011  . 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 HYPERLIPIDEMIA-MIXED 11/22/2008  . Essential hypertension, benign 11/22/2008  . Obstructive sleep apnea 05/20/2007  . Paroxysmal atrial fibrillation ([place]) 05/20/2007    Past Surgical History:  Procedure Laterality Date  . ANTERIOR FUSION LUMBAR SPINE    . CARDIOVERSION N/A 03/28/2014   Procedure: CARDIOVERSION;  Surgeon: Fay Records, MD;  Location: [facility];  Service: Cardiovascular;  Laterality: N/A;  . CATARACT EXTRACTION    . COLONOSCOPY N/A 08/04/2012   Procedure: COLONOSCOPY;  Surgeon: Rogene Houston, MD;  Location[facility];  Service: Endoscopy;  Laterality: N/A;  730-rescheduled to [place] notified pt  . ELECTROPHYSIOLOGIC STUDY N/A 09/18/2014   Procedure: Atrial Fibrillation Ablation;  Surgeon:  Thompson Grayer, MD;  Location: [facility] CV LAB;  Service: Cardiovascular;  Laterality: N/A;  . LASIK     Both eyes  . TEE WITHOUT CARDIOVERSION N/A 09/18/2014   Procedure: TRANSESOPHAGEAL ECHOCARDIOGRAM (TEE);  Surgeon: Larey Dresser, MD;  Location: [facility];  Service: Cardiovascular;  Laterality: N/A;  . TOTAL HIP ARTHROPLASTY  2010   Right    OB History    No data available       Home Medications    Prior to Admission medications   Medication Sig Start Date End Date Taking?  Authorizing Provider  acetaminophen (TYLENOL) 500 MG tablet Take 500 mg by mouth daily as needed for headache.   Yes Historical Provider, MD  atorvastatin (LIPITOR) 10 MG tablet Take 10 mg by mouth every evening.    Yes Historical Provider, MD  baclofen (LIORESAL) 10 MG tablet Take 10 mg by mouth daily as needed for muscle spasms.    Yes Historical Provider, MD  Biotin 1000 MCG tablet Take 1,000 mcg by mouth every morning.    Yes Historical Provider, MD  budesonide (PULMICORT) 0.5 MG/2ML nebulizer solution Take 0.5 mg by nebulization 2 (two) times daily.  08/13/11  Yes Deneise Lever, MD  diltiazem (CARDIZEM) 30 MG tablet Take 1 tablet (30 mg total) by mouth 3 (three) times daily. 05/27/16  Yes Satira Sark, MD  erythromycin ethylsuccinate (EES) 400 MG tablet Take 1,600 mg by mouth once as needed (Take one hour prior to dental appts.).    Yes Historical Provider, MD  flecainide (TAMBOCOR) 100 MG tablet Take 1 tablet (100 mg total) by mouth 2 (two) times daily. 02/10/16  Yes Sherran Needs, NP  fluticasone (FLONASE) 50 MCG/ACT nasal spray Place 1 spray into both nostrils 2 (two) times daily.   Yes Historical Provider, MD  Lifitegrast Shirley Friar) 5 % SOLN Place 1 drop into both eyes every morning.    Yes Historical Provider, MD  montelukast (SINGULAIR) 10 MG tablet Take 10 mg by mouth at bedtime.   Yes Historical Provider, MD  Multiple Minerals-Vitamins (CALCIUM-MAGNESIUM-ZINC-D3) TABS Take 1 tablet by mouth 2 (two) times daily.   Yes Historical Provider, MD  omeprazole (PRILOSEC) 40 MG capsule Take 40 mg by mouth every morning.   Yes Historical Provider, MD  sodium chloride (OCEAN) 0.65 % SOLN nasal spray Place 1 spray into both nostrils daily as needed for congestion.    Yes Historical Provider, MD  valsartan-hydrochlorothiazide (DIOVAN-HCT) 320-25 MG per tablet Take 1 tablet by mouth daily. Patient taking differently: Take 1 tablet by mouth every evening.  04/05/14  Yes Lendon Colonel, NP    warfarin (COUMADIN) 5 MG tablet Take 1 tablet (5 mg total) by mouth daily. Patient taking differently: Take 2.5-5 mg by mouth every evening. Daily except Monday 2.5 mg 12/19/14  Yes Thompson Grayer, MD  potassium chloride (K-DUR) 10 MEQ tablet Take 1 tablet (10 mEq total) by mouth daily. 06/04/16 06/11/16  Noemi Chapel, MD    Family History Family History  Problem Relation Age of Onset  . Cancer Mother   . Heart attack Father   . Colon cancer Other     Social History Social History  Substance Use Topics  . Smoking status: Never Smoker  . Smokeless tobacco: Never Used  . Alcohol use No     Allergies   Albuterol; Bupropion hcl; Escitalopram oxalate; Fluticasone-salmeterol; Lisinopril; Serevent; Macrodantin [nitrofurantoin]; and Penicillins   Review of Systems Review of Systems  All other systems reviewed and  are negative.    Physical Exam Updated Vital Signs BP (!) 132/51 (BP Location: Left Arm)   Pulse (!) 55   Temp 97.6 F (36.4 C) (Oral)   Resp 20   Ht 5\' 3"  (1.6 m)   Wt 206 lb (93.4 kg)   SpO2 97%   BMI 36.49 kg/m   Physical Exam  Constitutional: She appears well-developed and well-nourished. No distress.  HENT:  Head: Normocephalic and atraumatic.  Mouth/Throat: Oropharynx is clear and moist. No oropharyngeal exudate.  Eyes: Conjunctivae and EOM are normal. Pupils are equal, round, and reactive to light. Right eye exhibits no discharge. Left eye exhibits no discharge. No scleral icterus.  Neck: Normal range of motion. Neck supple. No JVD present. No thyromegaly present.  Cardiovascular: Normal rate, regular rhythm, normal heart sounds and intact distal pulses.  Exam reveals no gallop and no friction rub.   No murmur heard. Pulmonary/Chest: Effort normal and breath sounds normal. No respiratory distress. She has no wheezes. She has no rales.  Abdominal: Soft. Bowel sounds are normal. She exhibits no distension and no mass. There is no tenderness.  Musculoskeletal:  Normal range of motion. She exhibits no edema or tenderness.  Lymphadenopathy:    She has no cervical adenopathy.  Neurological: She is alert. Coordination normal.  Skin: Skin is warm and dry. No rash noted. No erythema.  Psychiatric: She has a normal mood and affect. Her behavior is normal.  Nursing note and vitals reviewed.    ED Treatments / Results  Labs (all labs ordered are listed, but only abnormal results are displayed) Labs Reviewed  CBC WITH DIFFERENTIAL/PLATELET - Abnormal; Notable for the following:       Result Value   Hemoglobin 11.4 (*)    HCT 34.2 (*)    Monocytes Absolute 1.1 (*)    All other components within normal limits  COMPREHENSIVE METABOLIC PANEL - Abnormal; Notable for the following:    Sodium 133 (*)    Potassium 2.7 (*)    Chloride 98 (*)    All other components within normal limits  TROPONIN I  BRAIN NATRIURETIC PEPTIDE  CBC WITH DIFFERENTIAL/PLATELET    EKG  EKG Interpretation  Date/Time:  Thursday June 04 2016 15:24:16 EST Ventricular Rate:  50 PR Interval:  106 QRS Duration: 98 QT Interval:  468 QTC Calculation: 426 R Axis:   -48 Text Interpretation:  Sinus bradycardia with short PR Left axis deviation Septal infarct , age undetermined Abnormal ECG Since last tracing rate slower Confirmed by Amalia Edgecombe  MD, Tarrin Menn ([id number]) on 06/04/2016 7:20:37 PM       Radiology Dg Chest 2 View  Result Date: 06/04/2016 CLINICAL DATA:  Short of breath with exertion, generalized weakness for 2 days EXAM: CHEST  2 VIEW COMPARISON:  Chest x-ray of 08/15/2005 T FINDINGS: No active infiltrate or effusion is seen. The lungs remain slightly hyperaerated. Mediastinal and hilar contours are unremarkable. Mild cardiomegaly is stable. No acute bony abnormality seen with degenerative change in the mid to lower thoracic spine. IMPRESSION: No active cardiopulmonary disease. Electronically Signed   By: Ivar Drape M.D.   On: 06/04/2016 16:38    Procedures Procedures  (including critical care time)  Medications Ordered in ED Medications  ipratropium-albuterol (DUONEB) 0.5-2.5 (3) MG/3ML nebulizer solution 3 mL (3 mLs Nebulization Given 06/04/16 1655)  magnesium sulfate IVPB 2 g 50 mL (2 g Intravenous New Bag/Given 06/04/16 1732)  potassium chloride SA (K-DUR,KLOR-CON) CR tablet 40 mEq (40 mEq Oral Given 06/04/16 1732)  Initial Impression / Assessment and Plan / ED Course  I have reviewed the triage vital signs and the nursing notes.  Pertinent labs & imaging results that were available during my care of the patient were reviewed by me and considered in my medical decision making (see chart for details).     The patient has no abnormal lung sounds, she speaks in full sentences without any respiratory distress and has no peripheral edema. The patient's body habitus makes it difficult to detect JVD as she has a large neck. She is in no distress, her vital signs are unremarkable, her blood pressure seems to be controlled, her heart rate is 60 bpm, she is in no distress. I will perform a chest x-ray, DuoNeb and some basic labs including a BNP to make sure that the patient does not have any signs of heart failure, the dyspnea on exertion seems to be chronic over the last 3 days however troponin is normal at that would suggest that she does not have acute ischemia. Would also consider pulmonary hypertension, reactive airway disease etc.  Magnesium replaced, potassium replaced, no evidence of etiology for the shortness of breath. She continues to have clear lung sounds after nebulized treatment, can go home and follow-up in the outpatient setting, likely needs echocardiogram for evaluation, consider pulmonary hypertension is another source of her shortness of breath.  Final Clinical Impressions(s) / ED Diagnoses   Final diagnoses:  SOB (shortness of breath)  Hypokalemia    New Prescriptions New Prescriptions   POTASSIUM CHLORIDE (K-DUR) 10 MEQ TABLET    Take 1  tablet (10 mEq total) by mouth daily.     Noemi Chapel, MD 06/04/16 [phone]

## 2016-06-05 ENCOUNTER — Ambulatory Visit (INDEPENDENT_AMBULATORY_CARE_PROVIDER_SITE_OTHER): Payer: Medicare Other

## 2016-06-05 DIAGNOSIS — I495 Sick sinus syndrome: Secondary | ICD-10-CM

## 2016-06-05 DIAGNOSIS — R001 Bradycardia, unspecified: Secondary | ICD-10-CM | POA: Diagnosis not present

## 2016-06-09 DIAGNOSIS — J449 Chronic obstructive pulmonary disease, unspecified: Secondary | ICD-10-CM | POA: Diagnosis not present

## 2016-06-09 DIAGNOSIS — Z9189 Other specified personal risk factors, not elsewhere classified: Secondary | ICD-10-CM | POA: Diagnosis not present

## 2016-06-09 DIAGNOSIS — R350 Frequency of micturition: Secondary | ICD-10-CM | POA: Diagnosis not present

## 2016-06-09 DIAGNOSIS — I4891 Unspecified atrial fibrillation: Secondary | ICD-10-CM | POA: Diagnosis not present

## 2016-06-09 DIAGNOSIS — G4730 Sleep apnea, unspecified: Secondary | ICD-10-CM | POA: Diagnosis not present

## 2016-06-09 DIAGNOSIS — Z299 Encounter for prophylactic measures, unspecified: Secondary | ICD-10-CM | POA: Diagnosis not present

## 2016-06-09 DIAGNOSIS — Z6837 Body mass index (BMI) 37.0-37.9, adult: Secondary | ICD-10-CM | POA: Diagnosis not present

## 2016-06-09 DIAGNOSIS — E876 Hypokalemia: Secondary | ICD-10-CM | POA: Diagnosis not present

## 2016-06-11 ENCOUNTER — Ambulatory Visit: Payer: Medicare Other | Admitting: Cardiology

## 2016-06-12 DIAGNOSIS — M542 Cervicalgia: Secondary | ICD-10-CM | POA: Diagnosis not present

## 2016-06-12 DIAGNOSIS — M9902 Segmental and somatic dysfunction of thoracic region: Secondary | ICD-10-CM | POA: Diagnosis not present

## 2016-06-12 DIAGNOSIS — M545 Low back pain: Secondary | ICD-10-CM | POA: Diagnosis not present

## 2016-06-12 DIAGNOSIS — M9903 Segmental and somatic dysfunction of lumbar region: Secondary | ICD-10-CM | POA: Diagnosis not present

## 2016-06-12 DIAGNOSIS — M9901 Segmental and somatic dysfunction of cervical region: Secondary | ICD-10-CM | POA: Diagnosis not present

## 2016-06-12 DIAGNOSIS — M546 Pain in thoracic spine: Secondary | ICD-10-CM | POA: Diagnosis not present

## 2016-06-15 ENCOUNTER — Telehealth: Payer: Self-pay

## 2016-06-15 DIAGNOSIS — E876 Hypokalemia: Secondary | ICD-10-CM | POA: Diagnosis not present

## 2016-06-15 NOTE — Telephone Encounter (Signed)
-----   Message from Satira Sark, MD sent at 06/15/2016  8:48 AM EDT ----- Results reviewed. Importantly, no substantial bradycardia or pauses. We will continue with our current plan. A copy of this test should be forwarded to [facility], MD.

## 2016-06-15 NOTE — Telephone Encounter (Signed)
PATIENT NOTIFIED.

## 2016-06-23 ENCOUNTER — Encounter (INDEPENDENT_AMBULATORY_CARE_PROVIDER_SITE_OTHER): Payer: Self-pay | Admitting: *Deleted

## 2016-06-24 ENCOUNTER — Other Ambulatory Visit (INDEPENDENT_AMBULATORY_CARE_PROVIDER_SITE_OTHER): Payer: Self-pay | Admitting: *Deleted

## 2016-06-24 DIAGNOSIS — K219 Gastro-esophageal reflux disease without esophagitis: Secondary | ICD-10-CM | POA: Insufficient documentation

## 2016-06-25 DIAGNOSIS — Z6838 Body mass index (BMI) 38.0-38.9, adult: Secondary | ICD-10-CM | POA: Diagnosis not present

## 2016-06-25 DIAGNOSIS — K219 Gastro-esophageal reflux disease without esophagitis: Secondary | ICD-10-CM | POA: Diagnosis not present

## 2016-06-25 DIAGNOSIS — Z299 Encounter for prophylactic measures, unspecified: Secondary | ICD-10-CM | POA: Diagnosis not present

## 2016-06-25 DIAGNOSIS — J441 Chronic obstructive pulmonary disease with (acute) exacerbation: Secondary | ICD-10-CM | POA: Diagnosis not present

## 2016-06-25 DIAGNOSIS — I10 Essential (primary) hypertension: Secondary | ICD-10-CM | POA: Diagnosis not present

## 2016-06-25 DIAGNOSIS — I4891 Unspecified atrial fibrillation: Secondary | ICD-10-CM | POA: Diagnosis not present

## 2016-06-25 DIAGNOSIS — Z713 Dietary counseling and surveillance: Secondary | ICD-10-CM | POA: Diagnosis not present

## 2016-06-25 DIAGNOSIS — J449 Chronic obstructive pulmonary disease, unspecified: Secondary | ICD-10-CM | POA: Diagnosis not present

## 2016-06-25 DIAGNOSIS — E7800 Pure hypercholesterolemia, unspecified: Secondary | ICD-10-CM | POA: Diagnosis not present

## 2016-06-25 DIAGNOSIS — Z789 Other specified health status: Secondary | ICD-10-CM | POA: Diagnosis not present

## 2016-07-06 NOTE — Progress Notes (Signed)
Cardiology Office Note  Date: 07/07/2016   ID: [name] [name], DOB May 25, 1940, MRN [id number]  PCP: Monico Blitz, MD  Primary Cardiologist: Rozann Lesches, MD   Chief Complaint  Patient presents with  . PAF    History of Present Illness: [name] [name] is a 76 y.o. female that I saw recently in the [place] office back in early March. At that time she presented with worsening shortness of breath and wheezing consistent with bronchospasm and suspected COPD exacerbation. Of note, she was not in atrial fibrillation at that time. She has since recovered and is back to baseline. Reports relatively chronic fatigue, but in general feels better and has had no significant elevation in heart rate based on her regular checks at home. She is satisfied with her current cardiac regimen.  Interval cardiac monitor was obtained demonstrating no substantial bradycardia or pauses on current medical regimen. We went over the results today. She has had no sudden lightheadedness or syncope.  Current cardiac regimen includes short acting Cardizem, flecainide, and Coumadin.  Past Medical History:  Diagnosis Date  . Allergic rhinitis   . Anxiety   . Atrial flutter ([place])   . COPD (chronic obstructive pulmonary disease) ([place])   . Depression   . Essential hypertension, benign   . Hyperlipidemia   . Lumbar disc disease   . Obesity   . OSA (obstructive sleep apnea)    CPAP  . Paroxysmal atrial fibrillation (HCC)    Element of tachycardia bradycardia syndrome  . Respiratory failure [facility])     Past Surgical History:  Procedure Laterality Date  . ANTERIOR FUSION LUMBAR SPINE    . CARDIOVERSION N/A 03/28/2014   Procedure: CARDIOVERSION;  Surgeon: Fay Records, MD;  Location: [facility];  Service: Cardiovascular;  Laterality: N/A;  . CATARACT EXTRACTION    . COLONOSCOPY N/A 08/04/2012   Procedure: COLONOSCOPY;  Surgeon: Rogene Houston, MD;  [facility];  Service: Endoscopy;  Laterality:  N/A;  730-rescheduled to [place] notified pt  . ELECTROPHYSIOLOGIC STUDY N/A 09/18/2014   Procedure: Atrial Fibrillation Ablation;  Surgeon: Thompson Grayer, MD;  Location: [place] CV LAB;  Service: Cardiovascular;  Laterality: N/A;  . LASIK     Both eyes  . TEE WITHOUT CARDIOVERSION N/A 09/18/2014   Procedure: TRANSESOPHAGEAL ECHOCARDIOGRAM (TEE);  Surgeon: Larey Dresser, MD;  Location: [place];  Service: Cardiovascular;  Laterality: N/A;  . TOTAL HIP ARTHROPLASTY  2010   Right    Current Outpatient Prescriptions  Medication Sig Dispense Refill  . acetaminophen (TYLENOL) 500 MG tablet Take 500 mg by mouth daily as needed for headache.    Marland Kitchen atorvastatin (LIPITOR) 10 MG tablet Take 10 mg by mouth every evening.     . baclofen (LIORESAL) 10 MG tablet Take 10 mg by mouth daily as needed for muscle spasms.     . Biotin 1000 MCG tablet Take 1,000 mcg by mouth every morning.     . budesonide (PULMICORT) 0.5 MG/2ML nebulizer solution Take 0.5 mg by nebulization 2 (two) times daily.     Marland Kitchen diltiazem (CARDIZEM) 30 MG tablet Take 1 tablet (30 mg total) by mouth 3 (three) times daily. 90 tablet 3  . erythromycin ethylsuccinate (EES) 400 MG tablet Take 1,600 mg by mouth once as needed (Take one hour prior to dental appts.).     Marland Kitchen flecainide (TAMBOCOR) 100 MG tablet Take 100 mg by mouth 2 (two) times daily.    . fluticasone (FLONASE) 50 MCG/ACT  nasal spray Place 1 spray into both nostrils 2 (two) times daily.    Marland Kitchen Lifitegrast (XIIDRA) 5 % SOLN Place 1 drop into both eyes every morning.     . montelukast (SINGULAIR) 10 MG tablet Take 10 mg by mouth at bedtime.    . Multiple Minerals-Vitamins (CALCIUM-MAGNESIUM-ZINC-D3) TABS Take 1 tablet by mouth 2 (two) times daily.    Marland Kitchen omeprazole (PRILOSEC) 40 MG capsule Take 40 mg by mouth every morning.    . valsartan-hydrochlorothiazide (DIOVAN-HCT) 320-25 MG per tablet Take 1 tablet by mouth daily. (Patient taking differently: Take 1 tablet by mouth every  evening. ) 30 tablet 6  . warfarin (COUMADIN) 5 MG tablet Take 1 tablet (5 mg total) by mouth daily. (Patient taking differently: Take 2.5-5 mg by mouth every evening. Daily except Monday 2.5 mg) 45 tablet 3   No current facility-administered medications for this visit.    Allergies:  Albuterol; Bupropion hcl; Escitalopram oxalate; Fluticasone-salmeterol; Lisinopril; Serevent; Macrodantin [nitrofurantoin]; and Penicillins   Social History: The patient  reports that she has never smoked. She has never used smokeless tobacco. She reports that she does not drink alcohol or use drugs.   ROS:  Please see the history of present illness. Otherwise, complete review of systems is positive for arthritis symptoms.  All other systems are reviewed and negative.   Physical Exam: VS:  BP 126/73 (BP Location: Left Arm, Patient Position: Sitting, Cuff Size: Large)   Pulse (!) 50   Ht 5\' 3"  (1.6 m)   Wt 208 lb (94.3 kg)   SpO2 98%   BMI 36.85 kg/m , BMI Body mass index is 36.85 kg/m.  Wt Readings from Last 3 Encounters:  07/07/16 208 lb (94.3 kg)  06/04/16 206 lb (93.4 kg)  06/04/16 206 lb (93.4 kg)    Overweight woman, moderately short of breath and wheezing.  HEENT: Conjunctiva and lids normal, oropharynx clear.  Neck: Supple, no elevated JVP or bruits.  Lungs: Decreased breath sounds without wheezing.  Cardiac: RRRwithout gallop, distant heart sounds, no significant systolic murmur.  Abdomen: Soft, nontender, bowel sounds present.  Extremities: No pitting edema.  Skin: Warm and dry. Musculoskeletal: No kyphosis. Neuropsychiatric: Alert and oriented 3, affect appropriate.  ECG: I personally reviewed the tracing from 06/04/2016 which showed sinus bradycardia with left anterior fascicular block and nonspecific ST changes.  Recent Labwork: 06/04/2016: ALT 16; AST 26; B Natriuretic Peptide 79.0; BUN 17; Creatinine, Ser 0.87; Hemoglobin 11.4; Platelets 184; Potassium 2.7; Sodium 133   Other  Studies Reviewed Today:  TEE 09/18/2014: Study Conclusions  - Left ventricle: The cavity size was normal. Wall thickness was normal. Systolic function was normal. The estimated ejection fraction was in the range of 60% to 65%. Wall motion was normal; there were no regional wall motion abnormalities. - Aortic valve: There was no stenosis. - Aorta: Normal caliber, minimal plaque. - Mitral valve: There was mild regurgitation. - Left atrium: The atrium was moderately dilated. No evidence of thrombus in the atrial cavity or appendage. No evidence of thrombus in the atrial cavity or appendage. - Right ventricle: The cavity size was normal. Systolic function was normal. - Right atrium: The atrium was moderately dilated. - Atrial septum: No defect or patent foramen ovale was identified. Echo contrast study showed no right-to-left atrial level shunt, at baseline or with provocation. - Tricuspid valve: Peak RV-RA gradient 33 mmHg.  Assessment and Plan:  1. Paroxysmal atrial fibrillation. No changes made present regimen which she is tolerating well at  this point. She will continue to monitor for breakthrough symptoms and also keep an eye on her heart rates.  2. Sick sinus syndrome, bradycardia at baseline on medical therapy. She is tolerating this well. We have discussed the possibility of pacemaker if conduction disease progresses.  3. COPD with recent exacerbation. She follows with Dr. Manuella Ghazi.  4. Essential hypertension, blood pressure is well controlled today.  Current medicines were reviewed with the patient today.  Disposition: Follow-up in 6 months, sooner if needed.  Signed, Satira Sark, MD, [facility] 07/07/2016 9:07 AM    [facility] [place], [place], [place] [address] Phone: [phone]; Fax: [phone]

## 2016-07-07 ENCOUNTER — Encounter: Payer: Self-pay | Admitting: Cardiology

## 2016-07-07 ENCOUNTER — Ambulatory Visit (INDEPENDENT_AMBULATORY_CARE_PROVIDER_SITE_OTHER): Payer: Medicare Other | Admitting: Cardiology

## 2016-07-07 VITALS — BP 126/73 | HR 50 | Ht 63.0 in | Wt 208.0 lb

## 2016-07-07 DIAGNOSIS — M9902 Segmental and somatic dysfunction of thoracic region: Secondary | ICD-10-CM | POA: Diagnosis not present

## 2016-07-07 DIAGNOSIS — I495 Sick sinus syndrome: Secondary | ICD-10-CM

## 2016-07-07 DIAGNOSIS — I480 Paroxysmal atrial fibrillation: Secondary | ICD-10-CM | POA: Diagnosis not present

## 2016-07-07 DIAGNOSIS — M546 Pain in thoracic spine: Secondary | ICD-10-CM | POA: Diagnosis not present

## 2016-07-07 DIAGNOSIS — M9901 Segmental and somatic dysfunction of cervical region: Secondary | ICD-10-CM | POA: Diagnosis not present

## 2016-07-07 DIAGNOSIS — M545 Low back pain: Secondary | ICD-10-CM | POA: Diagnosis not present

## 2016-07-07 DIAGNOSIS — I10 Essential (primary) hypertension: Secondary | ICD-10-CM

## 2016-07-07 DIAGNOSIS — M9903 Segmental and somatic dysfunction of lumbar region: Secondary | ICD-10-CM | POA: Diagnosis not present

## 2016-07-07 DIAGNOSIS — J449 Chronic obstructive pulmonary disease, unspecified: Secondary | ICD-10-CM | POA: Diagnosis not present

## 2016-07-07 DIAGNOSIS — M542 Cervicalgia: Secondary | ICD-10-CM | POA: Diagnosis not present

## 2016-07-07 NOTE — Patient Instructions (Signed)

## 2016-07-10 DIAGNOSIS — I10 Essential (primary) hypertension: Secondary | ICD-10-CM | POA: Diagnosis not present

## 2016-07-10 DIAGNOSIS — Z6837 Body mass index (BMI) 37.0-37.9, adult: Secondary | ICD-10-CM | POA: Diagnosis not present

## 2016-07-10 DIAGNOSIS — E7800 Pure hypercholesterolemia, unspecified: Secondary | ICD-10-CM | POA: Diagnosis not present

## 2016-07-10 DIAGNOSIS — M87059 Idiopathic aseptic necrosis of unspecified femur: Secondary | ICD-10-CM | POA: Diagnosis not present

## 2016-07-10 DIAGNOSIS — M1710 Unilateral primary osteoarthritis, unspecified knee: Secondary | ICD-10-CM | POA: Diagnosis not present

## 2016-07-10 DIAGNOSIS — Z299 Encounter for prophylactic measures, unspecified: Secondary | ICD-10-CM | POA: Diagnosis not present

## 2016-07-10 DIAGNOSIS — J449 Chronic obstructive pulmonary disease, unspecified: Secondary | ICD-10-CM | POA: Diagnosis not present

## 2016-07-10 DIAGNOSIS — I4891 Unspecified atrial fibrillation: Secondary | ICD-10-CM | POA: Diagnosis not present

## 2016-07-10 DIAGNOSIS — G4733 Obstructive sleep apnea (adult) (pediatric): Secondary | ICD-10-CM | POA: Diagnosis not present

## 2016-07-10 DIAGNOSIS — G250 Essential tremor: Secondary | ICD-10-CM | POA: Diagnosis not present

## 2016-07-10 DIAGNOSIS — G5620 Lesion of ulnar nerve, unspecified upper limb: Secondary | ICD-10-CM | POA: Diagnosis not present

## 2016-07-10 DIAGNOSIS — K219 Gastro-esophageal reflux disease without esophagitis: Secondary | ICD-10-CM | POA: Diagnosis not present

## 2016-07-14 DIAGNOSIS — M546 Pain in thoracic spine: Secondary | ICD-10-CM | POA: Diagnosis not present

## 2016-07-14 DIAGNOSIS — M545 Low back pain: Secondary | ICD-10-CM | POA: Diagnosis not present

## 2016-07-14 DIAGNOSIS — M9903 Segmental and somatic dysfunction of lumbar region: Secondary | ICD-10-CM | POA: Diagnosis not present

## 2016-07-14 DIAGNOSIS — M542 Cervicalgia: Secondary | ICD-10-CM | POA: Diagnosis not present

## 2016-07-14 DIAGNOSIS — M9901 Segmental and somatic dysfunction of cervical region: Secondary | ICD-10-CM | POA: Diagnosis not present

## 2016-07-14 DIAGNOSIS — M9902 Segmental and somatic dysfunction of thoracic region: Secondary | ICD-10-CM | POA: Diagnosis not present

## 2016-07-23 DIAGNOSIS — I10 Essential (primary) hypertension: Secondary | ICD-10-CM | POA: Diagnosis not present

## 2016-07-23 DIAGNOSIS — Z299 Encounter for prophylactic measures, unspecified: Secondary | ICD-10-CM | POA: Diagnosis not present

## 2016-07-23 DIAGNOSIS — K219 Gastro-esophageal reflux disease without esophagitis: Secondary | ICD-10-CM | POA: Diagnosis not present

## 2016-07-23 DIAGNOSIS — G250 Essential tremor: Secondary | ICD-10-CM | POA: Diagnosis not present

## 2016-07-23 DIAGNOSIS — I4891 Unspecified atrial fibrillation: Secondary | ICD-10-CM | POA: Diagnosis not present

## 2016-07-23 DIAGNOSIS — E7800 Pure hypercholesterolemia, unspecified: Secondary | ICD-10-CM | POA: Diagnosis not present

## 2016-07-23 DIAGNOSIS — Z6837 Body mass index (BMI) 37.0-37.9, adult: Secondary | ICD-10-CM | POA: Diagnosis not present

## 2016-07-23 DIAGNOSIS — M1710 Unilateral primary osteoarthritis, unspecified knee: Secondary | ICD-10-CM | POA: Diagnosis not present

## 2016-07-23 DIAGNOSIS — M87059 Idiopathic aseptic necrosis of unspecified femur: Secondary | ICD-10-CM | POA: Diagnosis not present

## 2016-07-23 DIAGNOSIS — J449 Chronic obstructive pulmonary disease, unspecified: Secondary | ICD-10-CM | POA: Diagnosis not present

## 2016-07-23 DIAGNOSIS — G5620 Lesion of ulnar nerve, unspecified upper limb: Secondary | ICD-10-CM | POA: Diagnosis not present

## 2016-07-23 DIAGNOSIS — G4733 Obstructive sleep apnea (adult) (pediatric): Secondary | ICD-10-CM | POA: Diagnosis not present

## 2016-07-27 DIAGNOSIS — M9903 Segmental and somatic dysfunction of lumbar region: Secondary | ICD-10-CM | POA: Diagnosis not present

## 2016-07-27 DIAGNOSIS — M546 Pain in thoracic spine: Secondary | ICD-10-CM | POA: Diagnosis not present

## 2016-07-27 DIAGNOSIS — M545 Low back pain: Secondary | ICD-10-CM | POA: Diagnosis not present

## 2016-07-27 DIAGNOSIS — M9901 Segmental and somatic dysfunction of cervical region: Secondary | ICD-10-CM | POA: Diagnosis not present

## 2016-07-27 DIAGNOSIS — M9902 Segmental and somatic dysfunction of thoracic region: Secondary | ICD-10-CM | POA: Diagnosis not present

## 2016-07-27 DIAGNOSIS — M542 Cervicalgia: Secondary | ICD-10-CM | POA: Diagnosis not present

## 2016-08-07 DIAGNOSIS — I4891 Unspecified atrial fibrillation: Secondary | ICD-10-CM | POA: Diagnosis not present

## 2016-08-07 DIAGNOSIS — M87059 Idiopathic aseptic necrosis of unspecified femur: Secondary | ICD-10-CM | POA: Diagnosis not present

## 2016-08-07 DIAGNOSIS — K219 Gastro-esophageal reflux disease without esophagitis: Secondary | ICD-10-CM | POA: Diagnosis not present

## 2016-08-07 DIAGNOSIS — G4733 Obstructive sleep apnea (adult) (pediatric): Secondary | ICD-10-CM | POA: Diagnosis not present

## 2016-08-07 DIAGNOSIS — G250 Essential tremor: Secondary | ICD-10-CM | POA: Diagnosis not present

## 2016-08-07 DIAGNOSIS — F329 Major depressive disorder, single episode, unspecified: Secondary | ICD-10-CM | POA: Diagnosis not present

## 2016-08-07 DIAGNOSIS — I10 Essential (primary) hypertension: Secondary | ICD-10-CM | POA: Diagnosis not present

## 2016-08-07 DIAGNOSIS — Z6837 Body mass index (BMI) 37.0-37.9, adult: Secondary | ICD-10-CM | POA: Diagnosis not present

## 2016-08-07 DIAGNOSIS — J449 Chronic obstructive pulmonary disease, unspecified: Secondary | ICD-10-CM | POA: Diagnosis not present

## 2016-08-07 DIAGNOSIS — E7800 Pure hypercholesterolemia, unspecified: Secondary | ICD-10-CM | POA: Diagnosis not present

## 2016-08-07 DIAGNOSIS — Z299 Encounter for prophylactic measures, unspecified: Secondary | ICD-10-CM | POA: Diagnosis not present

## 2016-08-13 DIAGNOSIS — M545 Low back pain: Secondary | ICD-10-CM | POA: Diagnosis not present

## 2016-08-13 DIAGNOSIS — M546 Pain in thoracic spine: Secondary | ICD-10-CM | POA: Diagnosis not present

## 2016-08-13 DIAGNOSIS — M542 Cervicalgia: Secondary | ICD-10-CM | POA: Diagnosis not present

## 2016-08-13 DIAGNOSIS — M9901 Segmental and somatic dysfunction of cervical region: Secondary | ICD-10-CM | POA: Diagnosis not present

## 2016-08-13 DIAGNOSIS — M9902 Segmental and somatic dysfunction of thoracic region: Secondary | ICD-10-CM | POA: Diagnosis not present

## 2016-08-13 DIAGNOSIS — M9903 Segmental and somatic dysfunction of lumbar region: Secondary | ICD-10-CM | POA: Diagnosis not present

## 2016-08-18 ENCOUNTER — Other Ambulatory Visit (HOSPITAL_COMMUNITY): Payer: Self-pay | Admitting: *Deleted

## 2016-08-18 MED ORDER — FLECAINIDE ACETATE 100 MG PO TABS: 100.0000 mg | ORAL_TABLET | Freq: Two times a day (BID) | ORAL | 2 refills | Status: DC

## 2016-08-27 ENCOUNTER — Ambulatory Visit (HOSPITAL_COMMUNITY)
Admission: RE | Admit: 2016-08-27 | Discharge: 2016-08-27 | Disposition: A | Discharge status: Home / Self Care | Payer: Medicare Other | Source: Ambulatory Visit | Attending: Internal Medicine | Admitting: Internal Medicine

## 2016-08-27 ENCOUNTER — Encounter (HOSPITAL_COMMUNITY): Payer: Self-pay | Admitting: *Deleted

## 2016-08-27 ENCOUNTER — Encounter (HOSPITAL_COMMUNITY)
Admission: RE | Disposition: A | Discharge status: Home / Self Care | Payer: Self-pay | Source: Ambulatory Visit | Attending: Internal Medicine

## 2016-08-27 DIAGNOSIS — Z7901 Long term (current) use of anticoagulants: Secondary | ICD-10-CM | POA: Diagnosis not present

## 2016-08-27 DIAGNOSIS — F329 Major depressive disorder, single episode, unspecified: Secondary | ICD-10-CM | POA: Insufficient documentation

## 2016-08-27 DIAGNOSIS — G4733 Obstructive sleep apnea (adult) (pediatric): Secondary | ICD-10-CM | POA: Diagnosis not present

## 2016-08-27 DIAGNOSIS — E669 Obesity, unspecified: Secondary | ICD-10-CM | POA: Insufficient documentation

## 2016-08-27 DIAGNOSIS — Z09 Encounter for follow-up examination after completed treatment for conditions other than malignant neoplasm: Secondary | ICD-10-CM | POA: Diagnosis not present

## 2016-08-27 DIAGNOSIS — Z79899 Other long term (current) drug therapy: Secondary | ICD-10-CM | POA: Insufficient documentation

## 2016-08-27 DIAGNOSIS — Z888 Allergy status to other drugs, medicaments and biological substances status: Secondary | ICD-10-CM | POA: Insufficient documentation

## 2016-08-27 DIAGNOSIS — I4892 Unspecified atrial flutter: Secondary | ICD-10-CM | POA: Diagnosis not present

## 2016-08-27 DIAGNOSIS — K449 Diaphragmatic hernia without obstruction or gangrene: Secondary | ICD-10-CM | POA: Diagnosis not present

## 2016-08-27 DIAGNOSIS — I480 Paroxysmal atrial fibrillation: Secondary | ICD-10-CM | POA: Insufficient documentation

## 2016-08-27 DIAGNOSIS — Z6835 Body mass index (BMI) 35.0-35.9, adult: Secondary | ICD-10-CM | POA: Diagnosis not present

## 2016-08-27 DIAGNOSIS — E785 Hyperlipidemia, unspecified: Secondary | ICD-10-CM | POA: Insufficient documentation

## 2016-08-27 DIAGNOSIS — Z880 Allergy status to penicillin: Secondary | ICD-10-CM | POA: Insufficient documentation

## 2016-08-27 DIAGNOSIS — K228 Other specified diseases of esophagus: Secondary | ICD-10-CM | POA: Diagnosis not present

## 2016-08-27 DIAGNOSIS — K317 Polyp of stomach and duodenum: Secondary | ICD-10-CM | POA: Diagnosis not present

## 2016-08-27 DIAGNOSIS — J449 Chronic obstructive pulmonary disease, unspecified: Secondary | ICD-10-CM | POA: Diagnosis not present

## 2016-08-27 DIAGNOSIS — Z7951 Long term (current) use of inhaled steroids: Secondary | ICD-10-CM | POA: Insufficient documentation

## 2016-08-27 DIAGNOSIS — K219 Gastro-esophageal reflux disease without esophagitis: Secondary | ICD-10-CM | POA: Diagnosis not present

## 2016-08-27 DIAGNOSIS — I10 Essential (primary) hypertension: Secondary | ICD-10-CM | POA: Diagnosis not present

## 2016-08-27 HISTORY — PX: BIOPSY: SHX5522

## 2016-08-27 HISTORY — PX: ESOPHAGOGASTRODUODENOSCOPY: SHX5428

## 2016-08-27 LAB — KOH PREP: KOH PREP: NONE SEEN

## 2016-08-27 SURGERY — EGD (ESOPHAGOGASTRODUODENOSCOPY)
Anesthesia: Moderate Sedation

## 2016-08-27 MED ORDER — MIDAZOLAM HCL 5 MG/5ML IJ SOLN
INTRAMUSCULAR | Status: DC | PRN
  Administered 2016-08-27 (×3): 2 mg via INTRAVENOUS

## 2016-08-27 MED ORDER — MEPERIDINE HCL 50 MG/ML IJ SOLN
INTRAMUSCULAR | Status: DC | PRN
  Administered 2016-08-27 (×2): 25 mg via INTRAVENOUS

## 2016-08-27 MED ORDER — SODIUM CHLORIDE 0.9 % IV SOLN
INTRAVENOUS | Status: DC
  Administered 2016-08-27: 11:00:00 via INTRAVENOUS

## 2016-08-27 MED ORDER — MIDAZOLAM HCL 5 MG/5ML IJ SOLN
INTRAMUSCULAR | Status: AC
  Filled 2016-08-27: qty 10

## 2016-08-27 MED ORDER — ONDANSETRON HCL 4 MG PO TABS: 4.0000 mg | ORAL_TABLET | Freq: Every day | ORAL | 0 refills | Status: DC | PRN

## 2016-08-27 MED ORDER — MEPERIDINE HCL 50 MG/ML IJ SOLN
INTRAMUSCULAR | Status: AC
  Filled 2016-08-27: qty 1

## 2016-08-27 MED ORDER — LIDOCAINE VISCOUS 2 % MT SOLN
OROMUCOSAL | Status: DC | PRN
  Administered 2016-08-27: 1 via OROMUCOSAL

## 2016-08-27 MED ORDER — LIDOCAINE VISCOUS 2 % MT SOLN
OROMUCOSAL | Status: AC
  Filled 2016-08-27: qty 15

## 2016-08-27 MED ORDER — FAMOTIDINE 20 MG PO TABS: 20.0000 mg | ORAL_TABLET | Freq: Every evening | ORAL | Status: DC | PRN

## 2016-08-27 NOTE — Op Note (Signed)
[facility] Patient Name: [name] [name] Procedure Date: 08/27/2016 10:57 AM MRN: [id number] Date of Birth: Oct 30, 1940 Attending MD: Hildred Laser , MD CSN: [id number] Age: 76 Admit Type: Outpatient Procedure:                Upper GI endoscopy Indications:              Follow-up of gastro-esophageal reflux disease Providers:                Hildred Laser, MD, Janeece Riggers, RN, Lurline Del, RN Referring MD:             Monico Blitz, MD Medicines:                Lidocaine spray, Meperidine 50 mg IV, Midazolam 6                            mg IV Complications:            No immediate complications. Estimated Blood Loss:     Estimated blood loss was minimal. Procedure:                Pre-Anesthesia Assessment:                           - Prior to the procedure, a History and Physical                            was performed, and patient medications and                            allergies were reviewed. The patient's tolerance of                            previous anesthesia was also reviewed. The risks                            and benefits of the procedure and the sedation                            options and risks were discussed with the patient.                            All questions were answered, and informed consent                            was obtained. Prior Anticoagulants: The patient                            last took Coumadin (warfarin) 6 days prior to the                            procedure. ASA Grade Assessment: III - A patient                            with severe systemic disease. After reviewing the  risks and benefits, the patient was deemed in                            satisfactory condition to undergo the procedure.                           After obtaining informed consent, the endoscope was                            passed under direct vision. Throughout the                            procedure, the patient's blood pressure,  pulse, and                            oxygen saturations were monitored continuously. The                            [id number]) was introduced through the mouth,                            and advanced to the second part of duodenum. The                            upper GI endoscopy was accomplished without                            difficulty. The patient tolerated the procedure                            well. Scope In: 11:34:35 AM Scope Out: 11:45:39 AM Total Procedure Duration: 0 hours 11 minutes 4 seconds  Findings:      Multiple plaques were found in the upper third of the esophagus. Cells       for cytology were obtained by brushing.      The middle third of the esophagus, lower third of the esophagus and       gastroesophageal junction were normal.      The Z-line was regular and was found 39 cm from the incisors.      A 2 cm hiatal hernia was present.      Multiple 3 to 9 mm pedunculated and sessile polyps were found in the       gastric fundus and in the gastric body. Biopsies were taken with a cold       forceps for histology.      The exam of the stomach was otherwise normal.      The duodenal bulb and second portion of the duodenum were normal. Impression:               - Multiple whitish plaques in the upper third of                            the esophagus. Brushing obtained for KOH.                           -  Normal middle third of esophagus, lower third of                            esophagus and gastroesophageal junction.                           - Z-line regular, 39 cm from the incisors.                           - 2 cm hiatal hernia.                           - Multiple gastric polyps. Four biopsied.                           - Normal duodenal bulb and second portion of the                            duodenum. Moderate Sedation:      Moderate (conscious) sedation was administered by the endoscopy nurse       and supervised by the endoscopist. The  following parameters were       monitored: oxygen saturation, heart rate, blood pressure, CO2       capnography and response to care. Total physician intraservice time was       17 minutes. Recommendation:           - Patient has a contact number available for                            emergencies. The signs and symptoms of potential                            delayed complications were discussed with the                            patient. Return to normal activities tomorrow.                            Written discharge instructions were provided to the                            patient.                           - Resume previous diet today.                           - Continue present medications.                           - Resume Coumadin (warfarin) at prior dose today.                            Refer to [facility] for further adjustment of  therapy.                           - Famotidine OTC 20 mg by mouth daily at bedtime                            when necessary.                           - Ondansetron 4 mg by mouth daily when necessary.                           - Await pathology results. Procedure Code(s):        --- Professional ---                           [phone], Esophagogastroduodenoscopy, flexible,                            transoral; with biopsy, single or multiple                           99152, Moderate sedation services provided by the                            same physician or other qualified health care                            professional performing the diagnostic or                            therapeutic service that the sedation supports,                            requiring the presence of an independent trained                            observer to assist in the monitoring of the                            patient's level of consciousness and physiological                            status; initial 15 minutes of  intraservice time,                            patient age 29 years or older Diagnosis Code(s):        --- Professional ---                           K22.8, Other specified diseases of esophagus                           K44.9, Diaphragmatic hernia without obstruction or  gangrene                           K31.7, Polyp of stomach and duodenum                           K21.9, Gastro-esophageal reflux disease without                            esophagitis CPT copyright 2016 American Medical Association. All rights reserved. The codes documented in this report are preliminary and upon coder review may  be revised to meet current compliance requirements. Hildred Laser, MD Hildred Laser, MD 08/27/2016 12:02:58 PM This report has been signed electronically. Number of Addenda: 0

## 2016-08-27 NOTE — Discharge Instructions (Signed)
Resume warfarin at usual dose starting this evening. INR check in 7-10 days. Resume other medications and diet as before. Ondansetron 4 mg by mouth daily when necessary when nausea significant. No driving for 24 hours. Physician will call with biopsy results and further recommendations.  Esophagogastroduodenoscopy, Care After Refer to this sheet in the next few weeks. These instructions provide you with information about caring for yourself after your procedure. Your health care provider may also give you more specific instructions. Your treatment has been planned according to current medical practices, but problems sometimes occur. Call your health care provider if you have any problems or questions after your procedure. What can I expect after the procedure? After the procedure, it is common to have:  A sore throat.  Nausea.  Bloating.  Dizziness.  Fatigue.  Follow these instructions at home:  Do not eat or drink anything until the numbing medicine (local anesthetic) has worn off and your gag reflex has returned. You will know that the local anesthetic has worn off when you can swallow comfortably.  Do not drive for 24 hours if you received a medicine to help you relax (sedative).  If your health care provider took a tissue sample for testing during the procedure, make sure to get your test results. This is your responsibility. Ask your health care provider or the department performing the test when your results will be ready.  Keep all follow-up visits as told by your health care provider. This is important. Contact a health care provider if:  You cannot stop coughing.  You are not urinating.  You are urinating less than usual. Get help right away if:  You have trouble swallowing.  You cannot eat or drink.  You have throat or chest pain that gets worse.  You are dizzy or light-headed.  You faint.  You have nausea or vomiting.  You have chills.  You have a  fever.  You have severe abdominal pain.  You have black, tarry, or bloody stools. This information is not intended to replace advice given to you by your health care provider. Make sure you discuss any questions you have with your health care provider. Document Released: 03/02/2012 Document Revised: 08/22/2015 Document Reviewed: 02/07/2015 Elsevier Interactive Patient Education  [employer].

## 2016-08-27 NOTE — H&P (Signed)
[name] [name] is an 76 y.o. female.   Chief Complaint: Patient is here for EGD. HPI: Patient is 76 year old Caucasian female was at symptoms of GERD for more than 20 years now having daily nausea. Nausea does not improve with food. Evaluation intermittent heartburn she also has noted hoarseness and coughing. Hoarseness has interfered with her ability to sing. She denies dysphagia vomiting abdominal pain melena or rectal bleeding. Her upper GI tract has never been examined. Patient has been off warfarin for 5 days.  Past Medical History:  Diagnosis Date  . Allergic rhinitis   . Anxiety   . Atrial flutter ([place])   . COPD (chronic obstructive pulmonary disease) ([place])   . Depression   . Essential hypertension, benign   . Hyperlipidemia   . Lumbar disc disease   . Obesity   . OSA (obstructive sleep apnea)    CPAP  . Paroxysmal atrial fibrillation ([facility])    Element of tachycardia bradycardia syndrome  . Respiratory failure [facility]     Past Surgical History:  Procedure Laterality Date  . ANTERIOR FUSION LUMBAR SPINE    . CARDIOVERSION N/A 03/28/2014   Procedure: CARDIOVERSION;  Surgeon: Fay Records, MD;  Location: [facility] ORS;  Service: Cardiovascular;  Laterality: N/A;  . CATARACT EXTRACTION    . COLONOSCOPY N/A 08/04/2012   Procedure: COLONOSCOPY;  Surgeon: Rogene Houston, MD;  Location: [facility] ENDO SUITE;  Service: Endoscopy;  Laterality: N/A;  730-rescheduled to [place] notified pt  . ELECTROPHYSIOLOGIC STUDY N/A 09/18/2014   Procedure: Atrial Fibrillation Ablation;  Surgeon: Thompson Grayer, MD;  Location: [place] CV LAB;  Service: Cardiovascular;  Laterality: N/A;  . LASIK     Both eyes  . TEE WITHOUT CARDIOVERSION N/A 09/18/2014   Procedure: TRANSESOPHAGEAL ECHOCARDIOGRAM (TEE);  Surgeon: Larey Dresser, MD;  Location: [place];  Service: Cardiovascular;  Laterality: N/A;  . TOTAL HIP ARTHROPLASTY  2010   Right    Family History  Problem Relation Age of Onset  . Cancer Mother    . Heart attack Father   . Colon cancer Other    Social History:  reports that she has never smoked. She has never used smokeless tobacco. She reports that she does not drink alcohol or use drugs.  Allergies:  Allergies  Allergen Reactions  . Bupropion Hcl Other (See Comments)    Suicidal Thoughts.   . Escitalopram Oxalate Other (See Comments)    Suicidal Thoughts.   . Lisinopril     cough  . Serevent Other (See Comments)    Caused patient to go into Afib  . Macrodantin [Nitrofurantoin] Rash  . Penicillins Rash    Childhood allergy  Has patient had a PCN reaction causing immediate rash, facial/tongue/throat swelling, SOB or lightheadedness with hypotension: Unknown Has patient had a PCN reaction causing severe rash involving mucus membranes or skin necrosis: Unknown Has patient had a PCN reaction that required hospitalization No Has patient had a PCN reaction occurring within the last 10 years: No If all of the above answers are "NO", then may proceed with Cephalosporin use.     Medications Prior to Admission  Medication Sig Dispense Refill  . acetaminophen (TYLENOL) 500 MG tablet Take 500 mg by mouth daily as needed for headache.    Marland Kitchen atorvastatin (LIPITOR) 10 MG tablet Take 10 mg by mouth every evening.     . Biotin 1000 MCG tablet Take 1,000 mcg by mouth every morning.     . budesonide (PULMICORT) 0.5 MG/2ML  nebulizer solution Take 0.5 mg by nebulization 2 (two) times daily.     . cetirizine (ZYRTEC) 10 MG tablet Take 10 mg by mouth daily as needed for allergies.    Marland Kitchen diltiazem (CARDIZEM) 30 MG tablet Take 1 tablet (30 mg total) by mouth 3 (three) times daily. (Patient taking differently: Take 30 mg by mouth 3 (three) times daily. May take an additional 30 mg every 6 hours as needed for afib) 90 tablet 3  . flecainide (TAMBOCOR) 100 MG tablet Take 1 tablet (100 mg total) by mouth 2 (two) times daily. 180 tablet 2  . fluticasone (FLONASE) 50 MCG/ACT nasal spray Place 1 spray  into both nostrils 2 (two) times daily.    Marland Kitchen HYDROcodone-homatropine (HYCODAN) 5-1.5 MG/5ML syrup Take 5 mLs by mouth 3 (three) times daily as needed for cough.  0  . Hypromellose (ARTIFICIAL TEARS OP) Apply 1 drop to eye every 4 (four) hours as needed (dry eyes).    Marland Kitchen Lifitegrast (XIIDRA) 5 % SOLN Place 1 drop into both eyes every morning.     . montelukast (SINGULAIR) 10 MG tablet Take 10 mg by mouth at bedtime.    Marland Kitchen omeprazole (PRILOSEC) 40 MG capsule Take 40 mg by mouth every morning.    . umeclidinium-vilanterol (ANORO ELLIPTA) 62.5-25 MCG/INH AEPB Inhale 1 puff into the lungs daily.    . valsartan-hydrochlorothiazide (DIOVAN-HCT) 320-25 MG per tablet Take 1 tablet by mouth daily. (Patient taking differently: Take 1 tablet by mouth every evening. ) 30 tablet 6  . antiseptic oral rinse (BIOTENE) LIQD 15 mLs by Mouth Rinse route as needed for dry mouth.    . baclofen (LIORESAL) 10 MG tablet Take 10 mg by mouth daily as needed for muscle spasms.     Marland Kitchen erythromycin ethylsuccinate (EES) 400 MG tablet Take 1,600 mg by mouth as directed. 1 hour prior to dental procedures    . Multiple Minerals-Vitamins (CALCIUM-MAGNESIUM-ZINC-D3) TABS Take 1 tablet by mouth 2 (two) times daily.    Marland Kitchen warfarin (COUMADIN) 5 MG tablet Take 1 tablet (5 mg total) by mouth daily. (Patient taking differently: Take 2.5-5 mg by mouth every evening. Take 2.5 mg daily on Mon, Wed, and Fri, then take 5mg s daily on Tues, Thur, Sat, and Sun) 45 tablet 3    No results found for this or any previous visit (from the past 48 hour(s)). No results found.  ROS  Blood pressure 137/75, pulse (!) 50, temperature 97.8 F (36.6 C), temperature source Oral, resp. rate 14, height 5' 3.5" (1.613 m), weight 204 lb (92.5 kg), SpO2 100 %. Physical Exam  Constitutional: She appears well-developed and well-nourished.  HENT:  Mouth/Throat: Oropharynx is clear and moist.  Eyes: Conjunctivae are normal. No scleral icterus.  Neck: No thyromegaly  present.  Cardiovascular: Normal rate, regular rhythm and normal heart sounds.   No murmur heard. Respiratory: Effort normal and breath sounds normal.  GI:  Abdomen is full but soft and nontender without organomegaly or masses.  Musculoskeletal: She exhibits no edema.  Lymphadenopathy:    She has no cervical adenopathy.  Neurological: She is alert.  Skin: Skin is warm and dry.     Assessment/Plan Chronic GERD. Diagnostic EGD.  Hildred Laser, MD 08/27/2016, 11:21 AM

## 2016-08-28 ENCOUNTER — Encounter (HOSPITAL_COMMUNITY): Payer: Self-pay | Admitting: Internal Medicine

## 2016-09-02 ENCOUNTER — Encounter: Payer: Self-pay | Admitting: Internal Medicine

## 2016-09-02 ENCOUNTER — Ambulatory Visit (INDEPENDENT_AMBULATORY_CARE_PROVIDER_SITE_OTHER): Payer: Medicare Other | Admitting: Internal Medicine

## 2016-09-02 ENCOUNTER — Other Ambulatory Visit (INDEPENDENT_AMBULATORY_CARE_PROVIDER_SITE_OTHER): Payer: Medicare Other

## 2016-09-02 VITALS — BP 120/72 | HR 50 | Resp 18 | Ht 63.5 in | Wt 204.8 lb

## 2016-09-02 DIAGNOSIS — G4733 Obstructive sleep apnea (adult) (pediatric): Secondary | ICD-10-CM | POA: Diagnosis not present

## 2016-09-02 DIAGNOSIS — R061 Stridor: Secondary | ICD-10-CM | POA: Diagnosis not present

## 2016-09-02 DIAGNOSIS — J4520 Mild intermittent asthma, uncomplicated: Secondary | ICD-10-CM | POA: Diagnosis not present

## 2016-09-02 DIAGNOSIS — I481 Persistent atrial fibrillation: Secondary | ICD-10-CM | POA: Diagnosis not present

## 2016-09-02 DIAGNOSIS — J45901 Unspecified asthma with (acute) exacerbation: Secondary | ICD-10-CM

## 2016-09-02 DIAGNOSIS — I4819 Other persistent atrial fibrillation: Secondary | ICD-10-CM

## 2016-09-02 LAB — BASIC METABOLIC PANEL
BUN: 16 mg/dL (ref 6–23)
CHLORIDE: 100 meq/L (ref 96–112)
CO2: 28 meq/L (ref 19–32)
CREATININE: 0.99 mg/dL (ref 0.40–1.20)
Calcium: 9.5 mg/dL (ref 8.4–10.5)
GFR: 57.83 mL/min — ABNORMAL LOW (ref >60.00)
GLUCOSE: 105 mg/dL — AB (ref 70–99)
POTASSIUM: 3.5 meq/L (ref 3.5–5.1)
Sodium: 138 mEq/L (ref 135–145)

## 2016-09-02 LAB — CBC WITH DIFFERENTIAL/PLATELET
BASOS ABS: 0.1 10*3/uL (ref 0.0–0.1)
Basophils Relative: 0.7 % (ref 0.0–3.0)
EOS ABS: 0.2 10*3/uL (ref 0.0–0.7)
Eosinophils Relative: 1.8 % (ref 0.0–5.0)
HCT: 35.5 % — ABNORMAL LOW (ref 36.0–46.0)
HEMOGLOBIN: 11.8 g/dL — AB (ref 12.0–15.0)
LYMPHS PCT: 16.2 % (ref 12.0–46.0)
Lymphs Abs: 1.5 10*3/uL (ref 0.7–4.0)
MCHC: 33.3 g/dL (ref 30.0–36.0)
MCV: 80.7 fl (ref 78.0–100.0)
MONOS PCT: 12.1 % — AB (ref 3.0–12.0)
Monocytes Absolute: 1.1 10*3/uL — ABNORMAL HIGH (ref 0.1–1.0)
NEUTROS ABS: 6.5 10*3/uL (ref 1.4–7.7)
Neutrophils Relative %: 69.2 % (ref 43.0–77.0)
Platelets: 223 10*3/uL (ref 150.0–400.0)
RBC: 4.4 Mil/uL (ref 3.87–5.11)
RDW: 15.4 % (ref 11.5–15.5)
WBC: 9.4 10*3/uL (ref 4.0–10.5)

## 2016-09-02 MED ORDER — FLUTICASONE-UMECLIDIN-VILANT 100-62.5-25 MCG/INH IN AEPB: 1.0000 | INHALATION_SPRAY | Freq: Once | RESPIRATORY_TRACT | 0 refills | Status: AC

## 2016-09-02 MED ORDER — LEVALBUTEROL HCL 0.63 MG/3ML IN NEBU
0.6300 mg | INHALATION_SOLUTION | Freq: Four times a day (QID) | RESPIRATORY_TRACT | Status: DC
  Administered 2016-09-02: 0.63 mg via RESPIRATORY_TRACT

## 2016-09-02 MED ORDER — METHYLPREDNISOLONE ACETATE 80 MG/ML IJ SUSP
80.0000 mg | Freq: Once | INTRAMUSCULAR | Status: AC
  Administered 2016-09-02: 80 mg via INTRAMUSCULAR

## 2016-09-02 NOTE — Patient Instructions (Signed)
Order- lab- CBC w diff, BMET, Allergy profile     Dx exacerbation asthma  Order- CT chest and neck with contrast       Dx asthma exacerbation, stridor  Order- schedule PFT        Sample x 2 Trelegy Ellipta     Inhale 1 puff, then rinse mouth, once daily     Try this instead of Anoro. When the sample runs out, go back to Anoro for comparison  Neb xop 0.63  Depo 80

## 2016-09-02 NOTE — Progress Notes (Signed)
HPI female never smoker followed for OSA, allergic rhinitis, complicated by A. Fib/Coumadin NPSG prior to EMR in 2012 Office Spirometry 09/02/16-WNL. FVC 2.06/76%, FEV1 1.57/77%, ratio 0.76, FEF 25-75% 1.26/80% -------------------------------------------------------------------------------------------  05/01/2015-76 year old female never smoker followed for OSA, allergic rhinitis, complicated by A. Fib/Coumadin CPAP 9/Advanced FOLLOWS FOR:  Wears CPAP nightly. Denies problems with mask/pressure. Recently received a new mask. Pt CPAP machine does not show AHI on downloads. DME: [facility]. Doing fine with CPAP and pressure seems appropriate. Download compliance looks very good. Cardiac ablation in April has failed and she continues flecainide. Complains of dry eyes question role of CPAP plus Dymista plus Pataday eyedrops. We discussed the medications. She does not know how to adjust her CPAP humidifier. Using Biotene.  08/16/2015-76 year old female never smoker followed for OSA, allergic rhinitis, complicated by A. Fib/Coumadin CPAP 9/Advanced FOLLOW FOR: doing well on CPAP. coughing, wheezing, SOB, tired, nose stuffy, tight in throat, upper respiratory infection.  Had URI mid-March treated with Cipro and prednisone. Residual productive cough white sputum, morning or wheeze. Stays tired with easy dyspnea on exertion noted while swimming. Has a "blockage" sensation upper throat Nebulizer helps CPAP download shows rate compliance and control  09/02/16- 76 year old female never smoker followed for OSA, allergic rhinitis, complicated by A. Fib/Coumadin CPAP 9/Advanced OSA  DME is [facility] patient is very complaince with her CPAP patient is sleep 10 or 12 hours a night and taking a nap in the afternoon patient want to know why she is sleeping so much. patient is very SOB  Seen in March by Dr. McDowell/cardiology at which time she was wheezing and short of breath, thought to have exacerbation of COPD although she  has never smoked. Potassium was 2.7. CPAP download 100% compliance averaging almost 7 hours per night with AHI 2.0/hour indicating good control. She complains of feeling sleepy although she averages 10-12 hours in bed with. She is up every hour to the bathroom. Still tries to swim it pulled 3 days per week but wheeze and dyspnea are limiting. Denies chest pain, palpitation, discolored sputum. Anoro inhaler sample was no help. Albuterol helps some but causes significant tremor. She continues Singulair and nebulized budesonide. She feels difficulty with airflow at the level of her throat. Known hiatal hernia. CXR 06/04/16-No active cardiopulmonary disease Office Spirometry 09/02/16-WNL. FVC 2.06/76%, FEV1 1.57/77%, ratio 0.76, FEF 25-75% 1.26/80%  ROS-see HPI Constitutional:   No-   weight loss, night sweats, fevers, chills, fatigue, lassitude. HEENT:   No-  headaches, difficulty swallowing, tooth/dental problems, sore throat,       No-  sneezing, itching,  +ear ache,  +nasal congestion, +post nasal drip,  CV:  No-   chest pain, orthopnea, PND, swelling in lower extremities, anasarca, dizziness, palpitations Resp: +  shortness of breath with exertion or at rest.               + productive cough,  No non-productive cough,  No- coughing up of blood.              No-change in color of mucus.  No- wheezing.   Skin: No-   rash or lesions. GI:  No-   heartburn, indigestion, abdominal pain, nausea, vomiting, GU:  MS:  No-   joint pain or swelling. . Neuro-     nothing unusual Psych:  No- change in mood or affect. No depression or anxiety.  No memory loss.   OBJ- Physical Exam General- Alert, Oriented, Affect-appropriate, Distress- none acute, + overweight Skin- rash-none, lesions- none, excoriation-  none Lymphadenopathy- none Head- atraumatic            Eyes- Gross vision intact, PERRLA, conjunctivae and secretions clear            Ears- Hearing, canals-normal            Nose- +turbinate edema,  no-Septal dev, mucus, polyps, erosion, perforation             Throat- Mallampati III-IV , mucosa clear, not red , drainage- none, tonsils- atrophic Neck- flexible , trachea midline, no stridor , thyroid nl, carotid no bruit Chest - symmetrical excursion , unlabored           Heart/CV- RRR (no pacemaker) , no murmur , no gallop  , no rub, nl  s1 s2                  - JVD- none , edema- none, stasis changes- none, varices- none           Lung- , wheeze+ coarse, winded conversation, cough+                        dullness-none, rub- none           Chest wall-  Abd- Br/ Gen/ Rectal- Not done, not indicated Extrem- cyanosis- none, clubbing, none, atrophy- none, strength- nl Neuro- grossly intact to observation

## 2016-09-03 LAB — RESPIRATORY ALLERGY PROFILE REGION II ~~LOC~~
ALLERGEN, A. ALTERNATA, M6: 0.69 kU/L — AB
ALLERGEN, CEDAR TREE, T6: 33.7 kU/L — AB
ALLERGEN, COMM SILVER BIRCH, T3: 25.5 kU/L — AB
ALLERGEN, MOUSE U PROTEIN, E72: 1.37 kU/L — AB
ASPERGILLUS FUMIGATUS M3: 1.07 kU/L — AB
Allergen, C. Herbarum, M2: 0.81 kU/L — ABNORMAL HIGH
Allergen, Cottonwood, t14: 37.4 kU/L — ABNORMAL HIGH
Allergen, D pternoyssinus,d7: 17 kU/L — ABNORMAL HIGH
Allergen, Mulberry, t76: 31.1 kU/L — ABNORMAL HIGH
Allergen, Oak,t7: 36.2 kU/L — ABNORMAL HIGH
Allergen, P. notatum, m1: 0.76 kU/L — ABNORMAL HIGH
Bermuda Grass: 34.1 kU/L — ABNORMAL HIGH
Box Elder IgE: 32.8 kU/L — ABNORMAL HIGH
COMMON RAGWEED: 37.2 kU/L — AB
Cat Dander: 4.35 kU/L — ABNORMAL HIGH
Cockroach: 34.4 kU/L — ABNORMAL HIGH
D. farinae: 21 kU/L — ABNORMAL HIGH
Dog Dander: 8.19 kU/L — ABNORMAL HIGH
Elm IgE: 38.1 kU/L — ABNORMAL HIGH
IGE (IMMUNOGLOBULIN E), SERUM: 4230 kU/L — AB (ref <115)
Johnson Grass: 39.9 kU/L — ABNORMAL HIGH
Pecan/Hickory Tree IgE: 36.9 kU/L — ABNORMAL HIGH
ROUGH PIGWEED IGE: 34.1 kU/L — AB
Sheep Sorrel IgE: 39.8 kU/L — ABNORMAL HIGH
TIMOTHY GRASS: 36 kU/L — AB

## 2016-09-04 DIAGNOSIS — M542 Cervicalgia: Secondary | ICD-10-CM | POA: Diagnosis not present

## 2016-09-04 DIAGNOSIS — J449 Chronic obstructive pulmonary disease, unspecified: Secondary | ICD-10-CM | POA: Diagnosis not present

## 2016-09-04 DIAGNOSIS — E7800 Pure hypercholesterolemia, unspecified: Secondary | ICD-10-CM | POA: Diagnosis not present

## 2016-09-04 DIAGNOSIS — I10 Essential (primary) hypertension: Secondary | ICD-10-CM | POA: Diagnosis not present

## 2016-09-04 DIAGNOSIS — M9901 Segmental and somatic dysfunction of cervical region: Secondary | ICD-10-CM | POA: Diagnosis not present

## 2016-09-04 DIAGNOSIS — F329 Major depressive disorder, single episode, unspecified: Secondary | ICD-10-CM | POA: Diagnosis not present

## 2016-09-04 DIAGNOSIS — Z299 Encounter for prophylactic measures, unspecified: Secondary | ICD-10-CM | POA: Diagnosis not present

## 2016-09-04 DIAGNOSIS — Z6837 Body mass index (BMI) 37.0-37.9, adult: Secondary | ICD-10-CM | POA: Diagnosis not present

## 2016-09-04 DIAGNOSIS — G250 Essential tremor: Secondary | ICD-10-CM | POA: Diagnosis not present

## 2016-09-04 DIAGNOSIS — I4891 Unspecified atrial fibrillation: Secondary | ICD-10-CM | POA: Diagnosis not present

## 2016-09-04 DIAGNOSIS — M9903 Segmental and somatic dysfunction of lumbar region: Secondary | ICD-10-CM | POA: Diagnosis not present

## 2016-09-04 DIAGNOSIS — G4733 Obstructive sleep apnea (adult) (pediatric): Secondary | ICD-10-CM | POA: Diagnosis not present

## 2016-09-04 DIAGNOSIS — M545 Low back pain: Secondary | ICD-10-CM | POA: Diagnosis not present

## 2016-09-04 DIAGNOSIS — K219 Gastro-esophageal reflux disease without esophagitis: Secondary | ICD-10-CM | POA: Diagnosis not present

## 2016-09-04 DIAGNOSIS — M546 Pain in thoracic spine: Secondary | ICD-10-CM | POA: Diagnosis not present

## 2016-09-04 DIAGNOSIS — M87059 Idiopathic aseptic necrosis of unspecified femur: Secondary | ICD-10-CM | POA: Diagnosis not present

## 2016-09-04 DIAGNOSIS — M9902 Segmental and somatic dysfunction of thoracic region: Secondary | ICD-10-CM | POA: Diagnosis not present

## 2016-09-06 NOTE — Assessment & Plan Note (Signed)
Wheezing/airway noise is much more impressive than her normal spirometry scores at this visit. We need better understanding of what is going on. Plan-sample Trelegy Ellipta inhaler for trial, CT chest and neck for anatomic basis of airway noise, full PFT

## 2016-09-06 NOTE — Assessment & Plan Note (Signed)
Controlled ventricular response rate, managed by cardiology

## 2016-09-06 NOTE — Assessment & Plan Note (Signed)
Download confirms good compliance and control. Current pressure is appropriate

## 2016-09-07 ENCOUNTER — Other Ambulatory Visit (INDEPENDENT_AMBULATORY_CARE_PROVIDER_SITE_OTHER): Payer: Self-pay | Admitting: Internal Medicine

## 2016-09-07 DIAGNOSIS — R350 Frequency of micturition: Secondary | ICD-10-CM | POA: Diagnosis not present

## 2016-09-07 DIAGNOSIS — Z713 Dietary counseling and surveillance: Secondary | ICD-10-CM | POA: Diagnosis not present

## 2016-09-07 DIAGNOSIS — M87059 Idiopathic aseptic necrosis of unspecified femur: Secondary | ICD-10-CM | POA: Diagnosis not present

## 2016-09-07 DIAGNOSIS — Z6837 Body mass index (BMI) 37.0-37.9, adult: Secondary | ICD-10-CM | POA: Diagnosis not present

## 2016-09-07 DIAGNOSIS — R110 Nausea: Secondary | ICD-10-CM

## 2016-09-07 DIAGNOSIS — J449 Chronic obstructive pulmonary disease, unspecified: Secondary | ICD-10-CM | POA: Diagnosis not present

## 2016-09-07 DIAGNOSIS — N390 Urinary tract infection, site not specified: Secondary | ICD-10-CM | POA: Diagnosis not present

## 2016-09-07 DIAGNOSIS — I4891 Unspecified atrial fibrillation: Secondary | ICD-10-CM | POA: Diagnosis not present

## 2016-09-10 ENCOUNTER — Ambulatory Visit (HOSPITAL_COMMUNITY)
Admission: RE | Admit: 2016-09-10 | Discharge: 2016-09-10 | Disposition: A | Discharge status: Home / Self Care | Payer: Medicare Other | Source: Ambulatory Visit | Attending: Internal Medicine | Admitting: Internal Medicine

## 2016-09-10 DIAGNOSIS — K862 Cyst of pancreas: Secondary | ICD-10-CM | POA: Diagnosis not present

## 2016-09-10 DIAGNOSIS — K769 Liver disease, unspecified: Secondary | ICD-10-CM | POA: Diagnosis not present

## 2016-09-10 DIAGNOSIS — R110 Nausea: Secondary | ICD-10-CM | POA: Diagnosis present

## 2016-09-15 NOTE — Progress Notes (Signed)
A staff message has been sent to [name] [name] to open a spot for this patient to see Dr. Laural Golden in one month.

## 2016-09-17 DIAGNOSIS — Z789 Other specified health status: Secondary | ICD-10-CM | POA: Diagnosis not present

## 2016-09-17 DIAGNOSIS — J449 Chronic obstructive pulmonary disease, unspecified: Secondary | ICD-10-CM | POA: Diagnosis not present

## 2016-09-17 DIAGNOSIS — R110 Nausea: Secondary | ICD-10-CM | POA: Diagnosis not present

## 2016-09-17 DIAGNOSIS — I4891 Unspecified atrial fibrillation: Secondary | ICD-10-CM | POA: Diagnosis not present

## 2016-09-17 DIAGNOSIS — J441 Chronic obstructive pulmonary disease with (acute) exacerbation: Secondary | ICD-10-CM | POA: Diagnosis not present

## 2016-09-17 DIAGNOSIS — Z713 Dietary counseling and surveillance: Secondary | ICD-10-CM | POA: Diagnosis not present

## 2016-09-17 DIAGNOSIS — Z6837 Body mass index (BMI) 37.0-37.9, adult: Secondary | ICD-10-CM | POA: Diagnosis not present

## 2016-09-17 DIAGNOSIS — Z299 Encounter for prophylactic measures, unspecified: Secondary | ICD-10-CM | POA: Diagnosis not present

## 2016-09-22 ENCOUNTER — Ambulatory Visit (HOSPITAL_COMMUNITY)
Admission: RE | Admit: 2016-09-22 | Discharge: 2016-09-22 | Disposition: A | Discharge status: Home / Self Care | Payer: Medicare Other | Source: Ambulatory Visit | Attending: Internal Medicine | Admitting: Internal Medicine

## 2016-09-22 DIAGNOSIS — J479 Bronchiectasis, uncomplicated: Secondary | ICD-10-CM | POA: Insufficient documentation

## 2016-09-22 DIAGNOSIS — I700 Atherosclerosis of aorta: Secondary | ICD-10-CM | POA: Diagnosis not present

## 2016-09-22 DIAGNOSIS — R061 Stridor: Secondary | ICD-10-CM | POA: Insufficient documentation

## 2016-09-22 DIAGNOSIS — J9811 Atelectasis: Secondary | ICD-10-CM | POA: Diagnosis not present

## 2016-09-22 DIAGNOSIS — E041 Nontoxic single thyroid nodule: Secondary | ICD-10-CM | POA: Diagnosis not present

## 2016-09-22 DIAGNOSIS — I7100 Dissection of unspecified site of aorta: Secondary | ICD-10-CM | POA: Diagnosis not present

## 2016-09-22 DIAGNOSIS — J45901 Unspecified asthma with (acute) exacerbation: Secondary | ICD-10-CM | POA: Insufficient documentation

## 2016-09-22 DIAGNOSIS — I2510 Atherosclerotic heart disease of native coronary artery without angina pectoris: Secondary | ICD-10-CM | POA: Diagnosis not present

## 2016-09-22 DIAGNOSIS — R05 Cough: Secondary | ICD-10-CM | POA: Diagnosis not present

## 2016-09-22 MED ORDER — IOPAMIDOL (ISOVUE-M 300) INJECTION 61%: 15.0000 mL | Freq: Once | INTRAMUSCULAR | Status: DC | PRN

## 2016-09-22 MED ORDER — IOPAMIDOL (ISOVUE-300) INJECTION 61%
100.0000 mL | Freq: Once | INTRAVENOUS | Status: AC | PRN
  Administered 2016-09-22: 100 mL via INTRAVENOUS

## 2016-09-28 ENCOUNTER — Telehealth: Payer: Self-pay | Admitting: Internal Medicine

## 2016-09-28 ENCOUNTER — Encounter (INDEPENDENT_AMBULATORY_CARE_PROVIDER_SITE_OTHER): Payer: Self-pay | Admitting: Internal Medicine

## 2016-09-28 NOTE — Telephone Encounter (Signed)
Spoke with patient regarding her CT results. She verbalized understanding, but still had questions about her SOB. After her last visit on 09/02/16, she was switched from Anoro to Trelegy. She stated that the Trelegy has helped some, but she is still having SOB. Especially with going outside in the recent heat and moving around.   She is aware that she has an appt with CY this month but wanted to know what she could do prior to her appt.   CY, please advise.

## 2016-09-29 MED ORDER — PREDNISONE 10 MG PO TABS: 10.0000 mg | ORAL_TABLET | ORAL | 0 refills | Status: DC

## 2016-09-29 NOTE — Telephone Encounter (Signed)
lmtcb X1 for pt to relay recs.   Will send in rx after speaking to pt. Will route to KW's inbasket for follow up as this was opened by Cherina.

## 2016-09-29 NOTE — Telephone Encounter (Signed)
lmomtcb x 2 for the pt.  

## 2016-09-29 NOTE — Telephone Encounter (Signed)
Patient is returning phone call.  °

## 2016-09-29 NOTE — Telephone Encounter (Signed)
Ok - cancel the lasix for now.  Offer prednisone 10 mg, # 20, 1 every other day.

## 2016-09-29 NOTE — Telephone Encounter (Signed)
I don't see a diuretic on her med list.  Suggest trial of lasix 20 mg, # 5, 1 daily.

## 2016-09-29 NOTE — Telephone Encounter (Signed)
Spoke with pt, aware of recs.  rx sent to preferred pharmacy.  Nothing further needed.  

## 2016-09-29 NOTE — Telephone Encounter (Signed)
Called and spoke with pt to let her know of CY recs.  She stated that she takes the BP meds that has 25 mg of the HCTZ in it in the evening.  I wanted to make sure from CY that he wanted to still give the lasix 20 mg daily x 5 days.   Pt also stated that her PCP gave her prednisone shot, pred taper and zpak and she finished this last week and she is feeling so much better.  She stated that since last year she has taken these every 2 months but only feels better for about 2 weeks and then she starts to feel back again.  Pt wanted to make sure that CY was aware of this.  CY please advise. Thanks

## 2016-09-29 NOTE — Telephone Encounter (Signed)
Pt returning call.[name] [name] ° °

## 2016-10-02 DIAGNOSIS — M87059 Idiopathic aseptic necrosis of unspecified femur: Secondary | ICD-10-CM | POA: Diagnosis not present

## 2016-10-02 DIAGNOSIS — G250 Essential tremor: Secondary | ICD-10-CM | POA: Diagnosis not present

## 2016-10-02 DIAGNOSIS — Z299 Encounter for prophylactic measures, unspecified: Secondary | ICD-10-CM | POA: Diagnosis not present

## 2016-10-02 DIAGNOSIS — E7800 Pure hypercholesterolemia, unspecified: Secondary | ICD-10-CM | POA: Diagnosis not present

## 2016-10-02 DIAGNOSIS — G4733 Obstructive sleep apnea (adult) (pediatric): Secondary | ICD-10-CM | POA: Diagnosis not present

## 2016-10-02 DIAGNOSIS — Z6837 Body mass index (BMI) 37.0-37.9, adult: Secondary | ICD-10-CM | POA: Diagnosis not present

## 2016-10-02 DIAGNOSIS — I4891 Unspecified atrial fibrillation: Secondary | ICD-10-CM | POA: Diagnosis not present

## 2016-10-02 DIAGNOSIS — I10 Essential (primary) hypertension: Secondary | ICD-10-CM | POA: Diagnosis not present

## 2016-10-02 DIAGNOSIS — J449 Chronic obstructive pulmonary disease, unspecified: Secondary | ICD-10-CM | POA: Diagnosis not present

## 2016-10-02 DIAGNOSIS — H04123 Dry eye syndrome of bilateral lacrimal glands: Secondary | ICD-10-CM | POA: Diagnosis not present

## 2016-10-02 DIAGNOSIS — J441 Chronic obstructive pulmonary disease with (acute) exacerbation: Secondary | ICD-10-CM | POA: Diagnosis not present

## 2016-10-07 DIAGNOSIS — M546 Pain in thoracic spine: Secondary | ICD-10-CM | POA: Diagnosis not present

## 2016-10-07 DIAGNOSIS — M9902 Segmental and somatic dysfunction of thoracic region: Secondary | ICD-10-CM | POA: Diagnosis not present

## 2016-10-07 DIAGNOSIS — M9903 Segmental and somatic dysfunction of lumbar region: Secondary | ICD-10-CM | POA: Diagnosis not present

## 2016-10-07 DIAGNOSIS — M542 Cervicalgia: Secondary | ICD-10-CM | POA: Diagnosis not present

## 2016-10-07 DIAGNOSIS — M9901 Segmental and somatic dysfunction of cervical region: Secondary | ICD-10-CM | POA: Diagnosis not present

## 2016-10-07 DIAGNOSIS — M545 Low back pain: Secondary | ICD-10-CM | POA: Diagnosis not present

## 2016-10-12 ENCOUNTER — Telehealth: Payer: Self-pay | Admitting: Cardiology

## 2016-10-12 ENCOUNTER — Other Ambulatory Visit: Payer: Self-pay | Admitting: Internal Medicine

## 2016-10-12 DIAGNOSIS — R0600 Dyspnea, unspecified: Secondary | ICD-10-CM

## 2016-10-12 NOTE — Telephone Encounter (Signed)
C/O going in and out a-fib since Sunday with SOB. No c/o chest pain or dizziness. Patient did take the prn diltiazem but feels it is dropping her BP too much. Appointment scheduled with the a-fib clinic tomorrow 10/13/16 @10 :00 am. Patient informed.

## 2016-10-12 NOTE — Telephone Encounter (Signed)
Patient called stating that she went into A.Fib around 230pm on Sunday. States that her BP is going up and Down.   Heart rate is staying up.

## 2016-10-13 ENCOUNTER — Ambulatory Visit (HOSPITAL_COMMUNITY)
Admission: RE | Admit: 2016-10-13 | Discharge: 2016-10-13 | Disposition: A | Discharge status: Home / Self Care | Payer: Medicare Other | Source: Ambulatory Visit | Attending: Nurse Practitioner | Admitting: Nurse Practitioner

## 2016-10-13 ENCOUNTER — Other Ambulatory Visit (HOSPITAL_COMMUNITY): Payer: Self-pay | Admitting: Nurse Practitioner

## 2016-10-13 ENCOUNTER — Ambulatory Visit (INDEPENDENT_AMBULATORY_CARE_PROVIDER_SITE_OTHER): Payer: Medicare Other | Admitting: Internal Medicine

## 2016-10-13 ENCOUNTER — Encounter (HOSPITAL_COMMUNITY): Payer: Self-pay | Admitting: Nurse Practitioner

## 2016-10-13 VITALS — BP 92/60 | HR 113 | Ht 63.5 in | Wt 201.0 lb

## 2016-10-13 DIAGNOSIS — F329 Major depressive disorder, single episode, unspecified: Secondary | ICD-10-CM | POA: Insufficient documentation

## 2016-10-13 DIAGNOSIS — F419 Anxiety disorder, unspecified: Secondary | ICD-10-CM | POA: Insufficient documentation

## 2016-10-13 DIAGNOSIS — G4733 Obstructive sleep apnea (adult) (pediatric): Secondary | ICD-10-CM | POA: Insufficient documentation

## 2016-10-13 DIAGNOSIS — R634 Abnormal weight loss: Secondary | ICD-10-CM | POA: Diagnosis not present

## 2016-10-13 DIAGNOSIS — Z7901 Long term (current) use of anticoagulants: Secondary | ICD-10-CM | POA: Insufficient documentation

## 2016-10-13 DIAGNOSIS — Z79899 Other long term (current) drug therapy: Secondary | ICD-10-CM | POA: Diagnosis not present

## 2016-10-13 DIAGNOSIS — Z880 Allergy status to penicillin: Secondary | ICD-10-CM | POA: Insufficient documentation

## 2016-10-13 DIAGNOSIS — I481 Persistent atrial fibrillation: Secondary | ICD-10-CM

## 2016-10-13 DIAGNOSIS — E785 Hyperlipidemia, unspecified: Secondary | ICD-10-CM | POA: Diagnosis not present

## 2016-10-13 DIAGNOSIS — Z6835 Body mass index (BMI) 35.0-35.9, adult: Secondary | ICD-10-CM | POA: Insufficient documentation

## 2016-10-13 DIAGNOSIS — I10 Essential (primary) hypertension: Secondary | ICD-10-CM | POA: Insufficient documentation

## 2016-10-13 DIAGNOSIS — I4819 Other persistent atrial fibrillation: Secondary | ICD-10-CM

## 2016-10-13 DIAGNOSIS — I480 Paroxysmal atrial fibrillation: Secondary | ICD-10-CM | POA: Diagnosis not present

## 2016-10-13 DIAGNOSIS — R0600 Dyspnea, unspecified: Secondary | ICD-10-CM

## 2016-10-13 DIAGNOSIS — J449 Chronic obstructive pulmonary disease, unspecified: Secondary | ICD-10-CM | POA: Insufficient documentation

## 2016-10-13 DIAGNOSIS — E669 Obesity, unspecified: Secondary | ICD-10-CM | POA: Insufficient documentation

## 2016-10-13 LAB — PULMONARY FUNCTION TEST
DL/VA % pred: 83 %
DL/VA: 3.92 ml/min/mmHg/L
DLCO UNC % PRED: 81 %
DLCO cor % pred: 77 %
DLCO cor: 17.64 ml/min/mmHg
DLCO unc: 18.68 ml/min/mmHg
FEF 25-75 PRE: 1.04 L/s
FEF 25-75 Post: 1.39 L/sec
FEF2575-%CHANGE-POST: 33 %
FEF2575-%PRED-POST: 92 %
FEF2575-%Pred-Pre: 69 %
FEV1-%CHANGE-POST: 6 %
FEV1-%Pred-Post: 83 %
FEV1-%Pred-Pre: 78 %
FEV1-PRE: 1.52 L
FEV1-Post: 1.62 L
FEV1FVC-%CHANGE-POST: 8 %
FEV1FVC-%PRED-PRE: 99 %
FEV6-%Change-Post: -1 %
FEV6-%Pred-Post: 81 %
FEV6-%Pred-Pre: 83 %
FEV6-PRE: 2.06 L
FEV6-Post: 2.02 L
FEV6FVC-%PRED-PRE: 105 %
FEV6FVC-%Pred-Post: 105 %
FVC-%Change-Post: -1 %
FVC-%PRED-POST: 77 %
FVC-%PRED-PRE: 79 %
FVC-POST: 2.02 L
FVC-PRE: 2.06 L
POST FEV1/FVC RATIO: 80 %
PRE FEV6/FVC RATIO: 100 %
Post FEV6/FVC ratio: 100 %
Pre FEV1/FVC ratio: 74 %
RV % PRED: 92 %
RV: 2.11 L
TLC % pred: 92 %
TLC: 4.53 L

## 2016-10-13 LAB — BASIC METABOLIC PANEL
ANION GAP: 8 (ref 5–15)
BUN: 22 mg/dL — AB (ref 6–20)
CALCIUM: 9.4 mg/dL (ref 8.9–10.3)
CO2: 25 mmol/L (ref 22–32)
Chloride: 99 mmol/L — ABNORMAL LOW (ref 101–111)
Creatinine, Ser: 1.11 mg/dL — ABNORMAL HIGH (ref 0.44–1.00)
GFR calc Af Amer: 54 mL/min — ABNORMAL LOW (ref >60)
GFR, EST NON AFRICAN AMERICAN: 47 mL/min — AB (ref >60)
GLUCOSE: 117 mg/dL — AB (ref 65–99)
POTASSIUM: 3.7 mmol/L (ref 3.5–5.1)
SODIUM: 132 mmol/L — AB (ref 135–145)

## 2016-10-13 LAB — CBC
HCT: 40.5 % (ref 36.0–46.0)
Hemoglobin: 13.3 g/dL (ref 12.0–15.0)
MCH: 26.5 pg (ref 26.0–34.0)
MCHC: 32.8 g/dL (ref 30.0–36.0)
MCV: 80.8 fL (ref 78.0–100.0)
PLATELETS: 263 10*3/uL (ref 150–400)
RBC: 5.01 MIL/uL (ref 3.87–5.11)
RDW: 15.4 % (ref 11.5–15.5)
WBC: 12.5 10*3/uL — AB (ref 4.0–10.5)

## 2016-10-13 LAB — PROTIME-INR
INR: 2.08
PROTHROMBIN TIME: 23.7 s — AB (ref 11.4–15.2)

## 2016-10-13 NOTE — Progress Notes (Signed)
Primary Care Physician: Monico Blitz, MD Referring Physician: Dr. [name] is a 76 y.o. female with a h/o paroxysmal afib that is here for evaluation in the afib clinic. She has h/o ablation 08/2014. Flecainide was decreased to 50 mg bid from 100 mg bid after ablation due to doing well and later stopped. She did have to go back on flecainide 100mg  bid at a later date for recurrent afib. She is in the afib clinic for paroxysmal afib since last  Saturday. She does not know any specific triggers for this episode. She struggles with her weight and knows that sometimes overeating will trigger afib.  No alcohol or caffeine, uses CPAP. She recently had an upper endoscopy and coumadin was held 5 days the end of May. Her last INR was subtherapeutic per pt. Last cardioversion was in 2015. She has used several of the 30 mg cardizem pilss since the weekened and her BP is low at 92/60, although tolerating well..  Today, she denies symptoms of palpitations, chest pain, shortness of breath, orthopnea, PND, lower extremity edema, dizziness, presyncope, syncope, or neurologic sequela. The patient is tolerating medications without difficulties and is otherwise without complaint today.   Past Medical History:  Diagnosis Date  . Allergic rhinitis   . Anxiety   . Atrial flutter ([name])   . COPD (chronic obstructive pulmonary disease) ([place])   . Depression   . Essential hypertension, benign   . Hyperlipidemia   . Lumbar disc disease   . Obesity   . OSA (obstructive sleep apnea)    CPAP  . Paroxysmal atrial fibrillation (H[facility])    Element of tachycardia bradycardia syndrome  . Respiratory failure [facility])    Past Surgical History:  Procedure Laterality Date  . ANTERIOR FUSION LUMBAR SPINE    . BIOPSY  08/27/2016   Procedure: BIOPSY;  Surgeon: Rogene Houston, MD;  Location: [facility];  Service: Endoscopy;;  FOUR GASTRIC POLYPS BIOPSIED  . CARDIOVERSION N/A 03/28/2014   Procedure:  CARDIOVERSION;  Surgeon: Fay Records, MD;  Location: [facility] ORS;  Service: Cardiovascular;  Laterality: N/A;  . CATARACT EXTRACTION    . COLONOSCOPY N/A 08/04/2012   Procedure: COLONOSCOPY;  Surgeon: Rogene Houston, MD;  Location: [facility];  Service: Endoscopy;  Laterality: N/A;  730-rescheduled to [place] notified pt  . ELECTROPHYSIOLOGIC STUDY N/A 09/18/2014   Procedure: Atrial Fibrillation Ablation;  Surgeon: Thompson Grayer, MD;  Location: [place] CV LAB;  Service: Cardiovascular;  Laterality: N/A;  . ESOPHAGOGASTRODUODENOSCOPY N/A 08/27/2016   Procedure: ESOPHAGOGASTRODUODENOSCOPY (EGD);  Surgeon: Rogene Houston, MD;  [facility];  Service: Endoscopy;  Laterality: N/A;  1200  . LASIK     Both eyes  . TEE WITHOUT CARDIOVERSION N/A 09/18/2014   Procedure: TRANSESOPHAGEAL ECHOCARDIOGRAM (TEE);  Surgeon: Larey Dresser, MD;  Location: [place];  Service: Cardiovascular;  Laterality: N/A;  . TOTAL HIP ARTHROPLASTY  2010   Right    Current Outpatient Prescriptions  Medication Sig Dispense Refill  . acetaminophen (TYLENOL) 500 MG tablet Take 500 mg by mouth daily as needed for headache.    Marland Kitchen antiseptic oral rinse (BIOTENE) LIQD 15 mLs by Mouth Rinse route as needed for dry mouth.    Marland Kitchen atorvastatin (LIPITOR) 10 MG tablet Take 10 mg by mouth every evening.     . baclofen (LIORESAL) 10 MG tablet Take 10 mg by mouth daily as needed for muscle spasms.     Marland Kitchen  Biotin 1000 MCG tablet Take 1,000 mcg by mouth every morning.     . budesonide (PULMICORT) 0.5 MG/2ML nebulizer solution Take 0.5 mg by nebulization 2 (two) times daily.     . cetirizine (ZYRTEC) 10 MG tablet Take 10 mg by mouth daily as needed for allergies.    Marland Kitchen diltiazem (CARDIZEM) 30 MG tablet Take 1 tablet (30 mg total) by mouth 3 (three) times daily. (Patient taking differently: Take 30 mg by mouth 3 (three) times daily. May take an additional 30 mg every 6 hours as needed for afib) 90 tablet 3  . erythromycin  ethylsuccinate (EES) 400 MG tablet Take 1,600 mg by mouth as directed. 1 hour prior to dental procedures    . famotidine (PEPCID) 20 MG tablet Take 1 tablet (20 mg total) by mouth at bedtime as needed for heartburn or indigestion.    . flecainide (TAMBOCOR) 100 MG tablet Take 1 tablet (100 mg total) by mouth 2 (two) times daily. 180 tablet 2  . fluticasone (FLONASE) 50 MCG/ACT nasal spray Place 1 spray into both nostrils 2 (two) times daily.    Marland Kitchen HYDROcodone-homatropine (HYCODAN) 5-1.5 MG/5ML syrup Take 5 mLs by mouth 3 (three) times daily as needed for cough.  0  . Hypromellose (ARTIFICIAL TEARS OP) Apply 1 drop to eye every 4 (four) hours as needed (dry eyes).    Marland Kitchen Lifitegrast (XIIDRA) 5 % SOLN Place 1 drop into both eyes every morning.     . montelukast (SINGULAIR) 10 MG tablet Take 10 mg by mouth at bedtime.    . Multiple Minerals-Vitamins (CALCIUM-MAGNESIUM-ZINC-D3) TABS Take 1 tablet by mouth 2 (two) times daily.    Marland Kitchen omeprazole (PRILOSEC) 40 MG capsule Take 40 mg by mouth every morning.    . ondansetron (ZOFRAN) 4 MG tablet Take 1 tablet (4 mg total) by mouth daily as needed for nausea or vomiting. 20 tablet 0  . predniSONE (DELTASONE) 10 MG tablet Take 1 tablet (10 mg total) by mouth every other day. 20 tablet 0  . umeclidinium-vilanterol (ANORO ELLIPTA) 62.5-25 MCG/INH AEPB Inhale 1 puff into the lungs daily.    . valsartan-hydrochlorothiazide (DIOVAN-HCT) 320-25 MG per tablet Take 1 tablet by mouth daily. (Patient taking differently: Take 1 tablet by mouth every evening. ) 30 tablet 6  . warfarin (COUMADIN) 5 MG tablet Take 1 tablet (5 mg total) by mouth daily. (Patient taking differently: Take 2.5-5 mg by mouth every evening. Take 2.5 mg daily on Mon, Wed, and Fri, then take 5mg s daily on Tues, Thur, Sat, and Sun) 45 tablet 3   Current Facility-Administered Medications  Medication Dose Route Frequency Provider Last Rate Last Dose  . levalbuterol (XOPENEX) nebulizer solution 0.63 mg  0.63  mg Nebulization Q6H Young, Clinton D, MD   0.63 mg at 09/02/16 1027    Allergies  Allergen Reactions  . Bupropion Hcl Other (See Comments)    Suicidal Thoughts.   . Escitalopram Oxalate Other (See Comments)    Suicidal Thoughts.   . Lisinopril     cough  . Serevent Other (See Comments)    Caused patient to go into Afib  . Macrodantin [Nitrofurantoin] Rash  . Penicillins Rash    Childhood allergy  Has patient had a PCN reaction causing immediate rash, facial/tongue/throat swelling, SOB or lightheadedness with hypotension: Unknown Has patient had a PCN reaction causing severe rash involving mucus membranes or skin necrosis: Unknown Has patient had a PCN reaction that required hospitalization No Has patient had a PCN reaction  occurring within the last 10 years: No If all of the above answers are "NO", then may proceed with Cephalosporin use.     Social History   Social History  . Marital status: Single    Spouse name: N/A  . Number of children: N/A  . Years of education: N/A   Occupational History  . Retired: Presenter, broadcasting    Social History Main Topics  . Smoking status: Never Smoker  . Smokeless tobacco: Never Used  . Alcohol use No  . Drug use: No  . Sexual activity: Not on file   Other Topics Concern  . Not on file   Social History Narrative   Pt lives in [place] alone. She was never married.   Retired Customer service manager.   Attends [employer]    Family History  Problem Relation Age of Onset  . Cancer Mother   . Heart attack Father   . Colon cancer Other     ROS- All systems are reviewed and negative except as per the HPI above  Physical Exam: Vitals:   10/13/16 1026  BP: 92/60  Pulse: (!) 113  Weight: 201 lb (91.2 kg)  Height: 5' 3.5" (1.613 m)    GEN- The patient is well appearing, alert and oriented x 3 today.   Head- normocephalic, atraumatic Eyes-  Sclera clear, conjunctiva pink Ears- hearing intact Oropharynx- clear Neck-  supple, no JVP Lymph- no cervical lymphadenopathy Lungs- Clear to ausculation bilaterally, normal work of breathing Heart- irregular rate and rhythm, no murmurs, rubs or gallops, PMI not laterally displaced GI- soft, NT, ND, + BS Extremities- no clubbing, cyanosis, or edema MS- no significant deformity or atrophy Skin- no rash or lesion Psych- euthymic mood, full affect Neuro- strength and sensation are intact  EKG-atrial fibrillation at 113 bpm, qrs int 116 ms, qtc 510 ms Epic records reviewed  Assessment and Plan: 1. Paroxysmal afib Continue flecainide 100 mg bid Continue Cardizem at 120 mg bid Continue warfarin 5 mg qd Will plan for TEE cardioversion, Tuesday 7/24 with INR today with bmet/cbc and INR again in am of procedure  2. Lifestyles issues Weight loss encouraged Continue gym classes Continue CPAP  3. HTN Low today, but asymptomatic, use caution with prn cardizem use  [name] [name], [facility] [address] [place], [place] [address] [phone]

## 2016-10-13 NOTE — Patient Instructions (Signed)
Your cardioversion is scheduled for : Tues July 24th  At 2:00 PM Arrive at the [facility] and go to admitting at 12:30 Do Not eat or drink anything after midnight the night prior to your procedure. Take all your medications with a sip of water prior to arrival. Do NOT miss any doses of your blood thinner. You will NOT be able to drive home after your procedure.   You will have another INR checked the day of the procedure.    Follow up with our office in one week after cardioversion

## 2016-10-13 NOTE — Progress Notes (Signed)
PFT done today. 

## 2016-10-13 NOTE — Progress Notes (Signed)
Pt instructions.Marland Kitchenerror

## 2016-10-16 ENCOUNTER — Ambulatory Visit (INDEPENDENT_AMBULATORY_CARE_PROVIDER_SITE_OTHER): Payer: Medicare Other | Admitting: Internal Medicine

## 2016-10-16 ENCOUNTER — Encounter: Payer: Self-pay | Admitting: Internal Medicine

## 2016-10-16 ENCOUNTER — Other Ambulatory Visit (INDEPENDENT_AMBULATORY_CARE_PROVIDER_SITE_OTHER): Payer: Medicare Other

## 2016-10-16 VITALS — BP 122/66 | HR 76 | Ht 63.5 in | Wt 203.6 lb

## 2016-10-16 DIAGNOSIS — K219 Gastro-esophageal reflux disease without esophagitis: Secondary | ICD-10-CM | POA: Diagnosis not present

## 2016-10-16 DIAGNOSIS — I10 Essential (primary) hypertension: Secondary | ICD-10-CM | POA: Diagnosis not present

## 2016-10-16 DIAGNOSIS — H6120 Impacted cerumen, unspecified ear: Secondary | ICD-10-CM | POA: Diagnosis not present

## 2016-10-16 DIAGNOSIS — D824 Hyperimmunoglobulin E [IgE] syndrome: Secondary | ICD-10-CM

## 2016-10-16 DIAGNOSIS — G4733 Obstructive sleep apnea (adult) (pediatric): Secondary | ICD-10-CM | POA: Diagnosis not present

## 2016-10-16 DIAGNOSIS — J449 Chronic obstructive pulmonary disease, unspecified: Secondary | ICD-10-CM | POA: Diagnosis not present

## 2016-10-16 DIAGNOSIS — J4521 Mild intermittent asthma with (acute) exacerbation: Secondary | ICD-10-CM

## 2016-10-16 DIAGNOSIS — I4891 Unspecified atrial fibrillation: Secondary | ICD-10-CM | POA: Diagnosis not present

## 2016-10-16 DIAGNOSIS — J4540 Moderate persistent asthma, uncomplicated: Secondary | ICD-10-CM

## 2016-10-16 DIAGNOSIS — Z888 Allergy status to other drugs, medicaments and biological substances status: Secondary | ICD-10-CM | POA: Diagnosis not present

## 2016-10-16 DIAGNOSIS — Z299 Encounter for prophylactic measures, unspecified: Secondary | ICD-10-CM | POA: Diagnosis not present

## 2016-10-16 DIAGNOSIS — Z6837 Body mass index (BMI) 37.0-37.9, adult: Secondary | ICD-10-CM | POA: Diagnosis not present

## 2016-10-16 LAB — CBC WITH DIFFERENTIAL/PLATELET
BASOS ABS: 0.1 10*3/uL (ref 0.0–0.1)
Basophils Relative: 1 % (ref 0.0–3.0)
EOS ABS: 0.1 10*3/uL (ref 0.0–0.7)
Eosinophils Relative: 0.9 % (ref 0.0–5.0)
HEMATOCRIT: 38.8 % (ref 36.0–46.0)
Hemoglobin: 12.7 g/dL (ref 12.0–15.0)
LYMPHS ABS: 1.9 10*3/uL (ref 0.7–4.0)
LYMPHS PCT: 18.2 % (ref 12.0–46.0)
MCHC: 32.7 g/dL (ref 30.0–36.0)
MCV: 82.1 fl (ref 78.0–100.0)
MONO ABS: 1.1 10*3/uL — AB (ref 0.1–1.0)
Monocytes Relative: 10.4 % (ref 3.0–12.0)
NEUTROS ABS: 7.3 10*3/uL (ref 1.4–7.7)
NEUTROS PCT: 69.5 % (ref 43.0–77.0)
PLATELETS: 232 10*3/uL (ref 150.0–400.0)
RBC: 4.73 Mil/uL (ref 3.87–5.11)
RDW: 16.5 % — ABNORMAL HIGH (ref 11.5–15.5)
WBC: 10.5 10*3/uL (ref 4.0–10.5)

## 2016-10-16 LAB — SEDIMENTATION RATE: Sed Rate: 24 mm/hr (ref 0–30)

## 2016-10-16 MED ORDER — PREDNISONE 10 MG PO TABS: 10.0000 mg | ORAL_TABLET | ORAL | 4 refills | Status: DC

## 2016-10-16 NOTE — Assessment & Plan Note (Signed)
She understands that the mechanical control of reflux is important. Basic reflux precautions reviewed.

## 2016-10-16 NOTE — Assessment & Plan Note (Signed)
It will be easier to determine how important asthmatic airway obstruction is once her atrial fibrillation is better controlled because I think that also contributes. Her significant IgE elevation and increased eosinophils offer the possibility that we could control her airways better with an IL5 agent like Fasenra . She was given information correctly and) today about this and is interested in trying it. Plan-Fasenra

## 2016-10-16 NOTE — Assessment & Plan Note (Signed)
She is generally compliant with CPAP but admits she doesn't wear it if atrial fib is active and she has to sleep sitting up in a recliner.

## 2016-10-16 NOTE — Patient Instructions (Addendum)
Order- lab- ANCA, sed rate, ANA, IgE, CBC w diff       Dx asthma moderate persistent, Hyper IgE   We can continue present meds  Script sent refilling prednisone to continue 10 mg every other day  until next visit.

## 2016-10-16 NOTE — Assessment & Plan Note (Signed)
Her body habitus contributes a component of obesity hypoventilation syndrome when lying in bed, to further complicate sleep breathing.

## 2016-10-16 NOTE — Progress Notes (Signed)
HPI female never smoker followed for OSA, allergic rhinitis, complicated by A. Fib/Coumadin NPSG prior to EMR in 2012 Office Spirometry 09/02/16-WNL. FVC 2.06/76%, FEV1 1.57/77%, ratio 0.76, FEF 25-75% 1.26/80% -------------------------------------------------------------------------------------------  09/02/16- 76 year old female never smoker followed for OSA, allergic rhinitis, complicated by A. Fib/Coumadin CPAP 9/Advanced OSA  DME is [facility] patient is very compliant with her CPAP patient is sleep 10 or 12 hours a night and taking a nap in the afternoon patient want to know why she is sleeping so much. patient is very SOB  Seen in March by Dr. McDowell/cardiology at which time she was wheezing and short of breath, thought to have exacerbation of COPD although she has never smoked. Potassium was 2.7. CPAP download 100% compliance averaging almost 7 hours per night with AHI 2.0/hour indicating good control. She complains of feeling sleepy although she averages 10-12 hours in bed with. She is up every hour to the bathroom. Still tries to swim in pool 3 days per week but wheeze and dyspnea are limiting. Denies chest pain, palpitation, discolored sputum. Anoro inhaler sample was no help. Albuterol helps some but causes significant tremor. She continues Singulair and nebulized budesonide. She feels difficulty with airflow at the level of her throat. Known hiatal hernia. CXR 06/04/16-No active cardiopulmonary disease Office Spirometry 09/02/16-WNL. FVC 2.06/76%, FEV1 1.57/77%, ratio 0.76, FEF 25-75% 1.26/80%  10/16/16- 76 year old female never smoker followed for OSA, allergic rhinitis, complicated by A. Fib/Coumadin CPAP 9/Advanced FOLLOWS FOR: Review PFT, CT Chest and CT Neck results with patient. Pt is set for [place] next week. Prednisone 10 mg QOD associated with significant improvement in cough. Continues and/or oh, Singulair, Xopenex by nebulizer and rescue inhaler, budesonide by nebulizer. She  has felt significantly better with much less cough since starting prednisone 10 mg every other day. She does cough if out in hot dry air. Pending cardioversion next week. Triggers for her A. fib include getting overheated, getting tired, eating too much. She remains comfortable with CPAP used every night and she sleeps better with it. If in A. fib she sleeps better upright in recliner without CPAP. Allergy labs 09/02/16- eosinophils 200, total IgE 4230 causing elevation of all specific allergen antibody levels including Aspergillus. PFT-10/13/16-minimal obstruction without response to dilator, minimal reduction of diffusion. FVC 2.02/77%, FEV1 1.62/83%, ratio 0.80, TLC 92%, DLCO 77% CT soft tissue neck 09/22/16 IMPRESSION: 1. 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 No laryngeal or pharyngeal mass or abnormality. 2. 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 CT chest 626/18  IMPRESSION: No active cardiopulmonary disease. Subsegmental atelectasis and nonspecific linear patchy densities in the lungs as described. They have a benign appearance. No definite lung nodule or lung mass. Small hyperdense masses in the mediastinum are stable compared with 2015 supporting benign etiology such as ectopic thyroid tissue. There is dilatation of the left atrium and left ventricle. Left heart dysfunction is not excluded. Correlate clinically as for the need for echocardiography. Three-vessel prominent coronary artery calcification. Bibasilar bronchiolectasis. Aortic Atherosclerosis (ICD10-I70.0).  ROS-see HPI   + = pos Constitutional:   No-   weight loss, night sweats, fevers, chills, fatigue, lassitude. HEENT:   No-  headaches, difficulty swallowing, tooth/dental problems, sore throat,       No-  sneezing, itching,  +ear ache,   +nasal congestion, +post nasal drip,  CV:  No-   chest pain, orthopnea, PND,  swelling in lower extremities, anasarca, dizziness, palpitations Resp: +  shortness of breath with exertion or at rest.                productive cough,  No non-productive cough,  No- coughing up of blood.              No-change in color of mucus.  No- wheezing.   Skin: No-   rash or lesions. GI:  No-   heartburn, indigestion, abdominal pain, nausea, vomiting, GU:  MS:  No-   joint pain or swelling. . Neuro-     nothing unusual Psych:  No- change in mood or affect. No depression or anxiety.  No memory loss.   OBJ- Physical Exam General- Alert, Oriented, Affect-appropriate, Distress- none acute, + overweight Skin- rash-none, lesions- none, excoriation- none Lymphadenopathy- none Head- atraumatic            Eyes- Gross vision intact, PERRLA, conjunctivae and secretions clear            Ears- Hearing, canals-normal            Nose- +turbinate edema, no-Septal dev, mucus, polyps, erosion, perforation             Throat- Mallampati III-IV , mucosa clear, not red , drainage- none, tonsils- atrophic Neck- flexible , trachea midline, no stridor , thyroid nl, carotid no bruit Chest - symmetrical excursion , unlabored           Heart/CV- +IRR (no pacemaker) , no murmur , no gallop  , no rub, nl  s1 s2                  - JVD- none , edema- none, stasis changes- none, varices- none           Lung- , wheeze-none, winded conversation, cough-none                        dullness-none, rub- none           Chest wall-  Abd- Br/ Gen/ Rectal- Not done, not indicated Extrem- cyanosis- none, clubbing, none, atrophy- none, strength- nl Neuro- grossly intact to observation

## 2016-10-19 LAB — ANA: ANA: NEGATIVE

## 2016-10-19 LAB — IGE: IGE (IMMUNOGLOBULIN E), SERUM: 4302 kU/L — AB (ref <115)

## 2016-10-20 ENCOUNTER — Encounter (INDEPENDENT_AMBULATORY_CARE_PROVIDER_SITE_OTHER): Payer: Self-pay | Admitting: Internal Medicine

## 2016-10-20 ENCOUNTER — Ambulatory Visit (INDEPENDENT_AMBULATORY_CARE_PROVIDER_SITE_OTHER): Payer: Medicare Other | Admitting: Internal Medicine

## 2016-10-20 ENCOUNTER — Telehealth: Payer: Self-pay | Admitting: Internal Medicine

## 2016-10-20 VITALS — BP 110/80 | HR 100 | Temp 97.0°F | Resp 20 | Ht 63.0 in | Wt 201.0 lb

## 2016-10-20 DIAGNOSIS — K219 Gastro-esophageal reflux disease without esophagitis: Secondary | ICD-10-CM | POA: Diagnosis not present

## 2016-10-20 DIAGNOSIS — R110 Nausea: Secondary | ICD-10-CM | POA: Diagnosis not present

## 2016-10-20 LAB — ANCA SCREEN W REFLEX TITER: ANCA Screen: NEGATIVE

## 2016-10-20 MED ORDER — FAMOTIDINE 20 MG PO TABS: 20.0000 mg | ORAL_TABLET | Freq: Every day | ORAL | Status: DC

## 2016-10-20 NOTE — Patient Instructions (Signed)
Call with progress report in 4-6 weeks regarding coughing spells and nausea

## 2016-10-20 NOTE — Telephone Encounter (Signed)
Spoke with the pt  She is calling about labs  Results have not been resulted on yet, please advise thanks

## 2016-10-20 NOTE — Progress Notes (Signed)
Presenting complaint;  Follow-up for GERD and nausea. Patient complains of coughing spells.  Database and Subjective:  Patient is 76 year old Caucasian female who is here for scheduled visit. She was evaluated for symptoms of recurrent nausea and GERD. She underwent EGD in May 2018 revealing small sliding hiatal hernia and gastric polyps. Biopsy revealed these to be hyperplastic and fundic gland polyps. She did not respond as for his nausea is concerned. She returned for upper abdominal ultrasound on 09/10/2016. It was negative for cholelithiasis but did reveal fatty liver.  She continues to have problems. She states Dr. Manuella Ghazi increase her Nexium to 40 mg twice a day but she really could not tell big difference. Her insurance would only pay for single dose. Even then her co-pay was very high and she is back on omeprazole 40 mg daily. She feels omeprazole is as good as Nexium is. She is having heartburn almost every day but it is not intractable. She is using at least 2 times daily. She is having less nausea though. She has not had any vomiting. She continues to have coughing spells which occur while she is eating and also after meals. He describes these spells to be intractable. She has not responded well to anti-allergic medications. She was begun on prednisone about 2 weeks ago and feels much better. She has been diagnosed with COPD. She is wondering if she has slow stomach. She is having pain and right flank ago she pulled a muscle the other day. She is also having cardioversion for atrial fibrillation on 10/22/2016. She has lost 9 pounds since her last visit of December 2017. She states she has changed her eating habits completely. Her goal is to lose more weight.    Current Medications: Outpatient Encounter Prescriptions as of 10/20/2016  Medication Sig  . acetaminophen (TYLENOL) 500 MG tablet Take 500 mg by mouth daily as needed for headache.  Marland Kitchen antiseptic oral rinse (BIOTENE) LIQD 15 mLs  by Mouth Rinse route as needed for dry mouth.  Marland Kitchen atorvastatin (LIPITOR) 10 MG tablet Take 10 mg by mouth every evening.   . baclofen (LIORESAL) 10 MG tablet Take 10 mg by mouth daily as needed for muscle spasms.   . Biotin 1000 MCG tablet Take 1,000 mcg by mouth every morning.   . budesonide (PULMICORT) 0.5 MG/2ML nebulizer solution Take 0.5 mg by nebulization 2 (two) times daily.   . cetirizine (ZYRTEC) 10 MG tablet Take 10 mg by mouth daily as needed for allergies.  Marland Kitchen diltiazem (CARDIZEM) 30 MG tablet Take 1 tablet (30 mg total) by mouth 3 (three) times daily. (Patient taking differently: Take 30 mg by mouth 3 (three) times daily. May take an additional 30 mg every 6 hours as needed for afib)  . erythromycin ethylsuccinate (EES) 400 MG tablet Take 1,600 mg by mouth as directed. 1 hour prior to dental procedures  . famotidine (PEPCID) 20 MG tablet Take 1 tablet (20 mg total) by mouth at bedtime as needed for heartburn or indigestion.  . flecainide (TAMBOCOR) 100 MG tablet Take 1 tablet (100 mg total) by mouth 2 (two) times daily.  . fluticasone (FLONASE) 50 MCG/ACT nasal spray Place 1 spray into both nostrils 2 (two) times daily.  Marland Kitchen HYDROcodone-homatropine (HYCODAN) 5-1.5 MG/5ML syrup Take 5 mLs by mouth 3 (three) times daily as needed for cough.  . Hypromellose (ARTIFICIAL TEARS OP) Apply 1 drop to eye every 4 (four) hours as needed (dry eyes).  Marland Kitchen Lifitegrast (XIIDRA) 5 % SOLN Place 1  drop into both eyes every morning.   . montelukast (SINGULAIR) 10 MG tablet Take 10 mg by mouth at bedtime.  . Multiple Minerals-Vitamins (CALCIUM-MAGNESIUM-ZINC-D3) TABS Take 1 tablet by mouth 2 (two) times daily.  Marland Kitchen omeprazole (PRILOSEC) 40 MG capsule Take 40 mg by mouth every morning.  . ondansetron (ZOFRAN) 4 MG tablet Take 1 tablet (4 mg total) by mouth daily as needed for nausea or vomiting.  . predniSONE (DELTASONE) 10 MG tablet Take 1 tablet (10 mg total) by mouth every other day.  . umeclidinium-vilanterol  (ANORO ELLIPTA) 62.5-25 MCG/INH AEPB Inhale 1 puff into the lungs daily.  . valsartan-hydrochlorothiazide (DIOVAN-HCT) 320-25 MG per tablet Take 1 tablet by mouth daily. (Patient taking differently: Take 1 tablet by mouth every evening. )  . warfarin (COUMADIN) 5 MG tablet Take 1 tablet (5 mg total) by mouth daily. (Patient taking differently: Take 2.5-5 mg by mouth every evening. Take 2.5 mg daily on Mon, Wed, and Fri, then take 5mg s daily on Tues, Thur, Sat, and Sun)   Facility-Administered Encounter Medications as of 10/20/2016  Medication  . levalbuterol (XOPENEX) nebulizer solution 0.63 mg     Objective: Blood pressure 110/80, pulse 100, temperature (!) 97 F (36.1 C), temperature source Oral, resp. rate 20, height 5\' 3"  (1.6 m), weight 201 lb (91.2 kg). She is alert and in no acute distress. Conjunctiva is pink. Sclera is nonicteric Oropharyngeal mucosa is normal. No neck masses or thyromegaly noted. Cardiac exam with iregular rhythm normal S1 and S2. No murmur or gallop noted. Lungs are clear to auscultation. Abdomen is full but soft and nontender without organomegaly or masses. No LE edema or clubbing noted.    Assessment:  #1. GERD. She is getting better or symptom control is not satisfactory. Now that she has changed her eating habits and is losing weight and should improve. She can use famotidine at bedtime rather than on when necessary basis. She is having coughing spells while she is eating and also after she is finished eating. Postprandial coughing spells raise the possibility of regurgitation and microaspiration. However coughing spells at other times cannot be blamed on GERD alone. If the symptoms persist will consider solid-phase gastric emptying study when pulmonary workup is complete.  #2. Nausea without vomiting. Differential diagnoses include nausea secondary to medications gastroparesis or it could be central in origin.   Plan:  Continue omeprazole 40 mg by  mouth every morning and take famotidine 20 mg by mouth daily at bedtime. Patient will monitor her symptoms of nausea and coughing spells and call us with a progress report in 4-6 weeks. Unless the symptoms improve will consider solid-phase gastric emptying study. Office visit in 6 months.

## 2016-10-20 NOTE — Telephone Encounter (Signed)
Results released

## 2016-10-21 DIAGNOSIS — M9903 Segmental and somatic dysfunction of lumbar region: Secondary | ICD-10-CM | POA: Diagnosis not present

## 2016-10-21 DIAGNOSIS — M9901 Segmental and somatic dysfunction of cervical region: Secondary | ICD-10-CM | POA: Diagnosis not present

## 2016-10-21 DIAGNOSIS — M9902 Segmental and somatic dysfunction of thoracic region: Secondary | ICD-10-CM | POA: Diagnosis not present

## 2016-10-21 DIAGNOSIS — M545 Low back pain: Secondary | ICD-10-CM | POA: Diagnosis not present

## 2016-10-21 DIAGNOSIS — M542 Cervicalgia: Secondary | ICD-10-CM | POA: Diagnosis not present

## 2016-10-21 DIAGNOSIS — M546 Pain in thoracic spine: Secondary | ICD-10-CM | POA: Diagnosis not present

## 2016-10-21 NOTE — Telephone Encounter (Signed)
Notes recorded by Deneise Lever, MD on 10/20/2016 at 12:26 PM EDT Labs-test for blood vessel inflammation were negative. The IgE antibody level remains high as it has been. It is appropriate to go forward with the application for Fasenra injection therapy which we started at the last visit. That application has been started and my nurse Joellen Jersey is keeping track of it.          Spoke with pt and notified of results per Dr. Annamaria Boots. Pt verbalized understanding and denied any questions.

## 2016-10-22 ENCOUNTER — Ambulatory Visit (HOSPITAL_COMMUNITY): Payer: Medicare Other | Admitting: Anesthesiology

## 2016-10-22 ENCOUNTER — Encounter (HOSPITAL_COMMUNITY): Payer: Self-pay | Admitting: *Deleted

## 2016-10-22 ENCOUNTER — Ambulatory Visit (HOSPITAL_BASED_OUTPATIENT_CLINIC_OR_DEPARTMENT_OTHER)
Admission: RE | Admit: 2016-10-22 | Discharge: 2016-10-22 | Disposition: A | Discharge status: Home / Self Care | Payer: Medicare Other | Source: Ambulatory Visit | Attending: Nurse Practitioner | Admitting: Nurse Practitioner

## 2016-10-22 ENCOUNTER — Telehealth: Payer: Self-pay | Admitting: Internal Medicine

## 2016-10-22 ENCOUNTER — Encounter (HOSPITAL_COMMUNITY)
Admission: RE | Disposition: A | Discharge status: Home / Self Care | Payer: Self-pay | Source: Ambulatory Visit | Attending: Cardiology

## 2016-10-22 ENCOUNTER — Ambulatory Visit (HOSPITAL_COMMUNITY)
Admission: RE | Admit: 2016-10-22 | Discharge: 2016-10-22 | Disposition: A | Discharge status: Home / Self Care | Payer: Medicare Other | Source: Ambulatory Visit | Attending: Cardiology | Admitting: Cardiology

## 2016-10-22 ENCOUNTER — Other Ambulatory Visit: Payer: Self-pay

## 2016-10-22 DIAGNOSIS — Z6835 Body mass index (BMI) 35.0-35.9, adult: Secondary | ICD-10-CM | POA: Diagnosis not present

## 2016-10-22 DIAGNOSIS — E669 Obesity, unspecified: Secondary | ICD-10-CM | POA: Insufficient documentation

## 2016-10-22 DIAGNOSIS — E785 Hyperlipidemia, unspecified: Secondary | ICD-10-CM | POA: Insufficient documentation

## 2016-10-22 DIAGNOSIS — J449 Chronic obstructive pulmonary disease, unspecified: Secondary | ICD-10-CM | POA: Diagnosis not present

## 2016-10-22 DIAGNOSIS — I4892 Unspecified atrial flutter: Secondary | ICD-10-CM | POA: Diagnosis not present

## 2016-10-22 DIAGNOSIS — Z981 Arthrodesis status: Secondary | ICD-10-CM | POA: Insufficient documentation

## 2016-10-22 DIAGNOSIS — I481 Persistent atrial fibrillation: Secondary | ICD-10-CM

## 2016-10-22 DIAGNOSIS — I361 Nonrheumatic tricuspid (valve) insufficiency: Secondary | ICD-10-CM | POA: Diagnosis not present

## 2016-10-22 DIAGNOSIS — F329 Major depressive disorder, single episode, unspecified: Secondary | ICD-10-CM | POA: Insufficient documentation

## 2016-10-22 DIAGNOSIS — Z7901 Long term (current) use of anticoagulants: Secondary | ICD-10-CM | POA: Insufficient documentation

## 2016-10-22 DIAGNOSIS — I4891 Unspecified atrial fibrillation: Secondary | ICD-10-CM

## 2016-10-22 DIAGNOSIS — I480 Paroxysmal atrial fibrillation: Secondary | ICD-10-CM | POA: Diagnosis not present

## 2016-10-22 DIAGNOSIS — G4733 Obstructive sleep apnea (adult) (pediatric): Secondary | ICD-10-CM | POA: Insufficient documentation

## 2016-10-22 DIAGNOSIS — F419 Anxiety disorder, unspecified: Secondary | ICD-10-CM | POA: Insufficient documentation

## 2016-10-22 DIAGNOSIS — Z880 Allergy status to penicillin: Secondary | ICD-10-CM | POA: Insufficient documentation

## 2016-10-22 DIAGNOSIS — I10 Essential (primary) hypertension: Secondary | ICD-10-CM | POA: Diagnosis not present

## 2016-10-22 DIAGNOSIS — Z7951 Long term (current) use of inhaled steroids: Secondary | ICD-10-CM | POA: Insufficient documentation

## 2016-10-22 HISTORY — PX: TEE WITHOUT CARDIOVERSION: SHX5443

## 2016-10-22 HISTORY — PX: CARDIOVERSION: SHX1299

## 2016-10-22 LAB — POCT I-STAT 4, (NA,K, GLUC, HGB,HCT)
Glucose, Bld: 83 mg/dL (ref 65–99)
HCT: 36 % (ref 36.0–46.0)
Hemoglobin: 12.2 g/dL (ref 12.0–15.0)
POTASSIUM: 3.5 mmol/L (ref 3.5–5.1)
Sodium: 138 mmol/L (ref 135–145)

## 2016-10-22 LAB — PROTIME-INR
INR: 3.24
PROTHROMBIN TIME: 33.8 s — AB (ref 11.4–15.2)

## 2016-10-22 SURGERY — ECHOCARDIOGRAM, TRANSESOPHAGEAL
Anesthesia: General

## 2016-10-22 MED ORDER — BUTAMBEN-TETRACAINE-BENZOCAINE 2-2-14 % EX AERO
INHALATION_SPRAY | CUTANEOUS | Status: DC | PRN
  Administered 2016-10-22: 2 via TOPICAL

## 2016-10-22 MED ORDER — LACTATED RINGERS IV SOLN
INTRAVENOUS | Status: DC | PRN
  Administered 2016-10-22: 13:00:00 via INTRAVENOUS

## 2016-10-22 MED ORDER — DILTIAZEM HCL 30 MG PO TABS: 30.0000 mg | ORAL_TABLET | Freq: Three times a day (TID) | ORAL | Status: DC

## 2016-10-22 MED ORDER — PROPOFOL 10 MG/ML IV BOLUS
INTRAVENOUS | Status: DC | PRN
  Administered 2016-10-22: 20 mg via INTRAVENOUS
  Administered 2016-10-22: 30 mg via INTRAVENOUS
  Administered 2016-10-22 (×2): 20 mg via INTRAVENOUS

## 2016-10-22 MED ORDER — PROPOFOL 500 MG/50ML IV EMUL
INTRAVENOUS | Status: DC | PRN
  Administered 2016-10-22: 50 ug/kg/min via INTRAVENOUS

## 2016-10-22 MED ORDER — VALSARTAN-HYDROCHLOROTHIAZIDE 320-25 MG PO TABS: 1.0000 | ORAL_TABLET | Freq: Every evening | ORAL | Status: DC

## 2016-10-22 MED ORDER — SODIUM CHLORIDE 0.9 % IV SOLN
INTRAVENOUS | Status: DC
Start: 2016-10-22 — End: 2016-10-22

## 2016-10-22 NOTE — Progress Notes (Signed)
  Echocardiogram Echocardiogram Transesophageal has been performed.  Johny Chess 10/22/2016, 1:52 PM

## 2016-10-22 NOTE — Discharge Instructions (Signed)
TEE ° °YOU HAD AN CARDIAC PROCEDURE TODAY: Refer to the procedure report and other information in the discharge instructions given to you for any specific questions about what was found during the examination. If this information does not answer your questions, please call [facility] office at [phone] to clarify.  ° °DIET: Your first meal following the procedure should be a light meal and then it is ok to progress to your normal diet. A half-sandwich or bowl of soup is an example of a good first meal. Heavy or fried foods are harder to digest and may make you feel nauseous or bloated. Drink plenty of fluids but you should avoid alcoholic beverages for 24 hours. If you had a esophageal dilation, please see attached instructions for diet.  ° °ACTIVITY: Your care partner should take you home directly after the procedure. You should plan to take it easy, moving slowly for the rest of the day. You can resume normal activity the day after the procedure however YOU SHOULD NOT DRIVE, use power tools, machinery or perform tasks that involve climbing or major physical exertion for 24 hours (because of the sedation medicines used during the test).  ° °SYMPTOMS TO REPORT IMMEDIATELY: °A cardiologist can be reached at any hour. Please call [phone] for any of the following symptoms:  °Vomiting of blood or coffee ground material  °New, significant abdominal pain  °New, significant chest pain or pain under the shoulder blades  °Painful or persistently difficult swallowing  °New shortness of breath  °Black, tarry-looking or red, bloody stools ° °FOLLOW UP:  °Please also call with any specific questions about appointments or follow up tests. ° °Electrical Cardioversion, Care After °This sheet gives you information about how to care for yourself after your procedure. Your health care provider may also give you more specific instructions. If you have problems or questions, contact your health care provider. °What can I  expect after the procedure? °After the procedure, it is common to have: °· Some redness on the skin where the shocks were given. ° °Follow these instructions at home: °· Do not drive for 24 hours if you were given a medicine to help you relax (sedative). °· Take over-the-counter and prescription medicines only as told by your health care provider. °· Ask your health care provider how to check your pulse. Check it often. °· Rest for 48 hours after the procedure or as told by your health care provider. °· Avoid or limit your caffeine use as told by your health care provider. °Contact a health care provider if: °· You feel like your heart is beating too quickly or your pulse is not regular. °· You have a serious muscle cramp that does not go away. °Get help right away if: °· You have discomfort in your chest. °· You are dizzy or you feel faint. °· You have trouble breathing or you are short of breath. °· Your speech is slurred. °· You have trouble moving an arm or leg on one side of your body. °· Your fingers or toes turn cold or blue. °This information is not intended to replace advice given to you by your health care provider. Make sure you discuss any questions you have with your health care provider. °Document Released: 01/04/2013 Document Revised: 10/18/2015 Document Reviewed: 09/20/2015 °Elsevier Interactive Patient Education © 2018 [employer] ° °

## 2016-10-22 NOTE — CV Procedure (Addendum)
    PROCEDURE NOTE:  Procedure:  Transesophageal echocardiogram Operator:  Fransico Him, MD Indications:  Atrial fibrillation Complications: None  During this procedure the patient is administered a total of Propofol 225 mg to achieve and maintain  sedation.  The patient's heart rate, blood pressure, and oxygen saturation are monitored continuously during the procedure by anesthesia with Dr. Ambrose Pancoast.   Results: Normal LV size and function Normal RV size and function Moderately dilated  RA Moderately dilated  LA.  Normal LA appendage.  No evidence of thrombus in LA or LA appendage.  Normal TV with moderate TR.  RVSP was 66mmHg Normal PV Normal MV with trivial MR Normal trileaflet AV Normal interatrial septum with no evidence of shunt by colorflow dopper  Normal thoracic and ascending aorta.  The patient tolerated the procedure well and went on to D[facility]V.    Electrical Cardioversion Procedure Note WANETTE ROBISON [id number] 12/15/1940  Procedure: Electrical Cardioversion Indications:  Atrial Fibrillation  Time Out: Verified patient identification, verified procedure,medications/allergies/relevent history reviewed, required imaging and test results available.  Performed  Procedure Details   Cardioversion was done with synchronized biphasic defibrillation with AP pads with 150watts.  The patient failed to convert to NSR.   converted to normal sinus rhythm. The patient tolerated the procedure well.  Cardioversion was done with synchronized biphasic defibrillation with AP pads with 200watts.  The patient converted to NSR.    IMPRESSION:  Successful cardioversion of atrial fibrillation.  No complications The patient was transferred back to her room in stable condition.   Patient's INR was 3.24 today.  Her coumadin is managed by her PCP.  I instructed the patient and her friend who came with her to call her PCP when she gets home get further instructions on coumadin dosing.    [name] [name] 10/22/2016, 12:03 PM

## 2016-10-22 NOTE — Transfer of Care (Signed)
Immediate Anesthesia Transfer of Care Note  Patient: [name] [name]  Procedure(s) Performed: Procedure(s): TRANSESOPHAGEAL ECHOCARDIOGRAM (TEE) (N/A) CARDIOVERSION (N/A)  Patient Location: Endoscopy Unit  Anesthesia Type:MAC  Level of Consciousness: drowsy and patient cooperative  Airway & Oxygen Therapy: Patient Spontanous Breathing and Patient connected to nasal cannula oxygen  Post-op Assessment: Report given to RN and Post -op Vital signs reviewed and stable  Post vital signs: Reviewed and stable  Last Vitals:  Vitals:   10/22/16 1215 10/22/16 1232  BP:  127/72  Pulse: (P) 95 90  Resp: (P) 14 15  Temp:  36.6 C    Last Pain:  Vitals:   10/22/16 1232  TempSrc: Oral         Complications: No apparent anesthesia complications

## 2016-10-22 NOTE — Anesthesia Procedure Notes (Signed)
Procedure Name: MAC Date/Time: 10/22/2016 1:21 PM Performed by: Mervyn Gay Pre-anesthesia Checklist: Patient identified, Patient being monitored, Timeout performed, Emergency Drugs available and Suction available Patient Re-evaluated:Patient Re-evaluated prior to induction Oxygen Delivery Method: Nasal cannula Number of attempts: 1 Placement Confirmation: positive ETCO2 Dental Injury: Teeth and Oropharynx as per pre-operative assessment

## 2016-10-22 NOTE — Interval H&P Note (Signed)
History and Physical Interval Note:  10/22/2016 12:03 PM  [name] [name]  has presented today for surgery, with the diagnosis of A-FIB  The various methods of treatment have been discussed with the patient and family. After consideration of risks, benefits and other options for treatment, the patient has consented to  Procedure(s): TRANSESOPHAGEAL ECHOCARDIOGRAM (TEE) (N/A) CARDIOVERSION (N/A) as a surgical intervention .  The patient's history has been reviewed, patient examined, no change in status, stable for surgery.  I have reviewed the patient's chart and labs.  Questions were answered to the patient's satisfaction.     Fransico Him

## 2016-10-22 NOTE — Interval H&P Note (Signed)
History and Physical Interval Note:  10/22/2016 1:20 PM  [name] [name]  has presented today for surgery, with the diagnosis of A-FIB  The various methods of treatment have been discussed with the patient and family. After consideration of risks, benefits and other options for treatment, the patient has consented to  Procedure(s): TRANSESOPHAGEAL ECHOCARDIOGRAM (TEE) (N/A) CARDIOVERSION (N/A) as a surgical intervention .  The patient's history has been reviewed, patient examined, no change in status, stable for surgery.  I have reviewed the patient's chart and labs.  Questions were answered to the patient's satisfaction.     Fransico Him

## 2016-10-22 NOTE — Anesthesia Preprocedure Evaluation (Addendum)
Anesthesia Evaluation  Patient identified by MRN, date of birth, ID band Patient awake    Reviewed: Allergy & Precautions, H&P , NPO status , Patient's Chart, lab work & pertinent test results  Airway Mallampati: III  TM Distance: >3 FB Neck ROM: Full    Dental no notable dental hx. (+) Teeth Intact, Dental Advisory Given   Pulmonary asthma , sleep apnea , COPD,  COPD inhaler,    Pulmonary exam normal breath sounds clear to auscultation       Cardiovascular hypertension, Pt. on medications + dysrhythmias Atrial Fibrillation  Rhythm:Irregular Rate:Normal     Neuro/Psych Anxiety Depression negative neurological ROS     GI/Hepatic negative GI ROS, Neg liver ROS, GERD  ,  Endo/Other  negative endocrine ROS  Renal/GU negative Renal ROS  negative genitourinary   Musculoskeletal   Abdominal   Peds  Hematology negative hematology ROS (+)   Anesthesia Other Findings 6/16  ECHO    - Left ventricle: The cavity size was normal. Wall thickness was   normal. Systolic function was normal. The estimated ejection   fraction was in the range of 60% to 65%. Wall motion was normal;   there were no regional wall motion abnormalities.  Reproductive/Obstetrics negative OB ROS                            Anesthesia Physical  Anesthesia Plan  ASA: III  Anesthesia Plan: General   Post-op Pain Management:    Induction: Intravenous  PONV Risk Score and Plan:   Airway Management Planned: LMA  Additional Equipment:   Intra-op Plan:   Post-operative Plan: Extubation in OR  Informed Consent: I have reviewed the patients History and Physical, chart, labs and discussed the procedure including the risks, benefits and alternatives for the proposed anesthesia with the patient or authorized representative who has indicated his/her understanding and acceptance.   Dental advisory given  Plan Discussed with:  CRNA  Anesthesia Plan Comments:         Anesthesia Quick Evaluation

## 2016-10-22 NOTE — Anesthesia Postprocedure Evaluation (Signed)
Anesthesia Post Note  Patient: DENALI SHARMA  Procedure(s) Performed: Procedure(s) (LRB): TRANSESOPHAGEAL ECHOCARDIOGRAM (TEE) (N/A) CARDIOVERSION (N/A)     Patient location during evaluation: PACU Anesthesia Type: MAC Level of consciousness: awake and alert Pain management: pain level controlled Vital Signs Assessment: post-procedure vital signs reviewed and stable Respiratory status: spontaneous breathing, nonlabored ventilation, respiratory function stable and patient connected to nasal cannula oxygen Cardiovascular status: stable and blood pressure returned to baseline Anesthetic complications: no    Last Vitals:  Vitals:   10/22/16 1420 10/22/16 1430  BP: 119/68 (!) 126/55  Pulse: 65 64  Resp: (!) 22 (!) 23  Temp:      Last Pain:  Vitals:   10/22/16 1353  TempSrc: Oral                 Kianni Lheureux

## 2016-10-22 NOTE — Telephone Encounter (Signed)
Wrong number provided to call back. I tried calling the [facility] number and was on hold for more that 15 minutes. Will attempt to call again tomorrow.

## 2016-10-22 NOTE — H&P (View-Only) (Signed)
Primary Care Physician: Monico Blitz, MD Referring Physician: Dr. [name] is a 76 y.o. female with a h/o paroxysmal afib that is here for evaluation in the afib clinic. She has h/o ablation 08/2014. Flecainide was decreased to 50 mg bid from 100 mg bid after ablation due to doing well and later stopped. She did have to go back on flecainide 100mg  bid at a later date for recurrent afib. She is in the afib clinic for paroxysmal afib since last  Saturday. She does not know any specific triggers for this episode. She struggles with her weight and knows that sometimes overeating will trigger afib.  No alcohol or caffeine, uses CPAP. She recently had an upper endoscopy and coumadin was held 5 days the end of May. Her last INR was subtherapeutic per pt. Last cardioversion was in 2015. She has used several of the 30 mg cardizem pilss since the weekened and her BP is low at 92/60, although tolerating well..  Today, she denies symptoms of palpitations, chest pain, shortness of breath, orthopnea, PND, lower extremity edema, dizziness, presyncope, syncope, or neurologic sequela. The patient is tolerating medications without difficulties and is otherwise without complaint today.   Past Medical History:  Diagnosis Date  . Allergic rhinitis   . Anxiety   . Atrial flutter ([place])   . COPD (chronic obstructive pulmonary disease) ([place])   . Depression   . Essential hypertension, benign   . Hyperlipidemia   . Lumbar disc disease   . Obesity   . OSA (obstructive sleep apnea)    CPAP  . Paroxysmal atrial fibrillation ([facility])    Element of tachycardia bradycardia syndrome  . Respiratory failure [name])    Past Surgical History:  Procedure Laterality Date  . ANTERIOR FUSION LUMBAR SPINE    . BIOPSY  08/27/2016   Procedure: BIOPSY;  Surgeon: Rogene Houston, MD;  Location: [facility];  Service: Endoscopy;;  FOUR GASTRIC POLYPS BIOPSIED  . CARDIOVERSION N/A 03/28/2014   Procedure:  CARDIOVERSION;  Surgeon: Fay Records, MD;  Location: [facility] ORS;  Service: Cardiovascular;  Laterality: N/A;  . CATARACT EXTRACTION    . COLONOSCOPY N/A 08/04/2012   Procedure: COLONOSCOPY;  Surgeon: Rogene Houston, MD;  Location: [facility];  Service: Endoscopy;  Laterality: N/A;  730-rescheduled to [place] notified pt  . ELECTROPHYSIOLOGIC STUDY N/A 09/18/2014   Procedure: Atrial Fibrillation Ablation;  Surgeon: Thompson Grayer, MD;  Location: [place] [facility];  Service: Cardiovascular;  Laterality: N/A;  . ESOPHAGOGASTRODUODENOSCOPY N/A 08/27/2016   Procedure: ESOPHAGOGASTRODUODENOSCOPY (EGD);  Surgeon: Rogene Houston, MD;  [facility];  Service: Endoscopy;  Laterality: N/A;  1200  . LASIK     Both eyes  . TEE WITHOUT CARDIOVERSION N/A 09/18/2014   Procedure: TRANSESOPHAGEAL ECHOCARDIOGRAM (TEE);  Surgeon: Larey Dresser, MD;  Location: [facility];  Service: Cardiovascular;  Laterality: N/A;  . TOTAL HIP ARTHROPLASTY  2010   Right    Current Outpatient Prescriptions  Medication Sig Dispense Refill  . acetaminophen (TYLENOL) 500 MG tablet Take 500 mg by mouth daily as needed for headache.    Marland Kitchen antiseptic oral rinse (BIOTENE) LIQD 15 mLs by Mouth Rinse route as needed for dry mouth.    Marland Kitchen atorvastatin (LIPITOR) 10 MG tablet Take 10 mg by mouth every evening.     . baclofen (LIORESAL) 10 MG tablet Take 10 mg by mouth daily as needed for muscle spasms.     Marland Kitchen  Biotin 1000 MCG tablet Take 1,000 mcg by mouth every morning.     . budesonide (PULMICORT) 0.5 MG/2ML nebulizer solution Take 0.5 mg by nebulization 2 (two) times daily.     . cetirizine (ZYRTEC) 10 MG tablet Take 10 mg by mouth daily as needed for allergies.    Marland Kitchen diltiazem (CARDIZEM) 30 MG tablet Take 1 tablet (30 mg total) by mouth 3 (three) times daily. (Patient taking differently: Take 30 mg by mouth 3 (three) times daily. May take an additional 30 mg every 6 hours as needed for afib) 90 tablet 3  . erythromycin  ethylsuccinate (EES) 400 MG tablet Take 1,600 mg by mouth as directed. 1 hour prior to dental procedures    . famotidine (PEPCID) 20 MG tablet Take 1 tablet (20 mg total) by mouth at bedtime as needed for heartburn or indigestion.    . flecainide (TAMBOCOR) 100 MG tablet Take 1 tablet (100 mg total) by mouth 2 (two) times daily. 180 tablet 2  . fluticasone (FLONASE) 50 MCG/ACT nasal spray Place 1 spray into both nostrils 2 (two) times daily.    Marland Kitchen HYDROcodone-homatropine (HYCODAN) 5-1.5 MG/5ML syrup Take 5 mLs by mouth 3 (three) times daily as needed for cough.  0  . Hypromellose (ARTIFICIAL TEARS OP) Apply 1 drop to eye every 4 (four) hours as needed (dry eyes).    Marland Kitchen Lifitegrast (XIIDRA) 5 % SOLN Place 1 drop into both eyes every morning.     . montelukast (SINGULAIR) 10 MG tablet Take 10 mg by mouth at bedtime.    . Multiple Minerals-Vitamins (CALCIUM-MAGNESIUM-ZINC-D3) TABS Take 1 tablet by mouth 2 (two) times daily.    Marland Kitchen omeprazole (PRILOSEC) 40 MG capsule Take 40 mg by mouth every morning.    . ondansetron (ZOFRAN) 4 MG tablet Take 1 tablet (4 mg total) by mouth daily as needed for nausea or vomiting. 20 tablet 0  . predniSONE (DELTASONE) 10 MG tablet Take 1 tablet (10 mg total) by mouth every other day. 20 tablet 0  . umeclidinium-vilanterol (ANORO ELLIPTA) 62.5-25 MCG/INH AEPB Inhale 1 puff into the lungs daily.    . valsartan-hydrochlorothiazide (DIOVAN-HCT) 320-25 MG per tablet Take 1 tablet by mouth daily. (Patient taking differently: Take 1 tablet by mouth every evening. ) 30 tablet 6  . warfarin (COUMADIN) 5 MG tablet Take 1 tablet (5 mg total) by mouth daily. (Patient taking differently: Take 2.5-5 mg by mouth every evening. Take 2.5 mg daily on Mon, Wed, and Fri, then take 5mg s daily on Tues, Thur, Sat, and Sun) 45 tablet 3   Current Facility-Administered Medications  Medication Dose Route Frequency Provider Last Rate Last Dose  . levalbuterol (XOPENEX) nebulizer solution 0.63 mg  0.63  mg Nebulization Q6H Young, Clinton D, MD   0.63 mg at 09/02/16 1027    Allergies  Allergen Reactions  . Bupropion Hcl Other (See Comments)    Suicidal Thoughts.   . Escitalopram Oxalate Other (See Comments)    Suicidal Thoughts.   . Lisinopril     cough  . Serevent Other (See Comments)    Caused patient to go into Afib  . Macrodantin [Nitrofurantoin] Rash  . Penicillins Rash    Childhood allergy  Has patient had a PCN reaction causing immediate rash, facial/tongue/throat swelling, SOB or lightheadedness with hypotension: Unknown Has patient had a PCN reaction causing severe rash involving mucus membranes or skin necrosis: Unknown Has patient had a PCN reaction that required hospitalization No Has patient had a PCN reaction  occurring within the last 10 years: No If all of the above answers are "NO", then may proceed with Cephalosporin use.     Social History   Social History  . Marital status: Single    Spouse name: N/A  . Number of children: N/A  . Years of education: N/A   Occupational History  . Retired: Presenter, broadcasting    Social History Main Topics  . Smoking status: Never Smoker  . Smokeless tobacco: Never Used  . Alcohol use No  . Drug use: No  . Sexual activity: Not on file   Other Topics Concern  . Not on file   Social History Narrative   Pt lives in [place] alone. She was never married.   Retired Customer service manager.   Attends [employer]    Family History  Problem Relation Age of Onset  . Cancer Mother   . Heart attack Father   . Colon cancer Other     ROS- All systems are reviewed and negative except as per the HPI above  Physical Exam: Vitals:   10/13/16 1026  BP: 92/60  Pulse: (!) 113  Weight: 201 lb (91.2 kg)  Height: 5' 3.5" (1.613 m)    GEN- The patient is well appearing, alert and oriented x 3 today.   Head- normocephalic, atraumatic Eyes-  Sclera clear, conjunctiva pink Ears- hearing intact Oropharynx- clear Neck-  supple, no JVP Lymph- no cervical lymphadenopathy Lungs- Clear to ausculation bilaterally, normal work of breathing Heart- irregular rate and rhythm, no murmurs, rubs or gallops, PMI not laterally displaced GI- soft, NT, ND, + BS Extremities- no clubbing, cyanosis, or edema MS- no significant deformity or atrophy Skin- no rash or lesion Psych- euthymic mood, full affect Neuro- strength and sensation are intact  EKG-atrial fibrillation at 113 bpm, qrs int 116 ms, qtc 510 ms Epic records reviewed  Assessment and Plan: 1. Paroxysmal afib Continue flecainide 100 mg bid Continue Cardizem at 120 mg bid Continue warfarin 5 mg qd Will plan for TEE cardioversion, Tuesday 7/24 with INR today with bmet/cbc and INR again in am of procedure  2. Lifestyles issues Weight loss encouraged Continue gym classes Continue CPAP  3. HTN Low today, but asymptomatic, use caution with prn cardizem use  [name] [name], [name] [name] [address] [place], [place] [address] [phone]

## 2016-10-23 ENCOUNTER — Encounter (HOSPITAL_COMMUNITY): Payer: Self-pay | Admitting: Cardiology

## 2016-10-23 NOTE — Telephone Encounter (Addendum)
Will route to Katie to f/u on, as she has already being working on this today.

## 2016-10-23 NOTE — Telephone Encounter (Deleted)
Spoke with pt, to see if there were any symptoms that we should make MR aware of. Pt states she would like to call and reschedule when she has transportation.  Will route to MR as a FYI.

## 2016-10-26 DIAGNOSIS — M542 Cervicalgia: Secondary | ICD-10-CM | POA: Diagnosis not present

## 2016-10-26 DIAGNOSIS — M545 Low back pain: Secondary | ICD-10-CM | POA: Diagnosis not present

## 2016-10-26 DIAGNOSIS — M9902 Segmental and somatic dysfunction of thoracic region: Secondary | ICD-10-CM | POA: Diagnosis not present

## 2016-10-26 DIAGNOSIS — M9901 Segmental and somatic dysfunction of cervical region: Secondary | ICD-10-CM | POA: Diagnosis not present

## 2016-10-26 DIAGNOSIS — M546 Pain in thoracic spine: Secondary | ICD-10-CM | POA: Diagnosis not present

## 2016-10-26 DIAGNOSIS — M9903 Segmental and somatic dysfunction of lumbar region: Secondary | ICD-10-CM | POA: Diagnosis not present

## 2016-10-26 NOTE — Telephone Encounter (Signed)
Called to let us know they sent BOS on patient and they need the PAN form faxed over. TS will resend to them and nothing more needed at this time.

## 2016-10-27 ENCOUNTER — Ambulatory Visit (HOSPITAL_COMMUNITY)
Admission: RE | Admit: 2016-10-27 | Discharge: 2016-10-27 | Disposition: A | Discharge status: Home / Self Care | Payer: Medicare Other | Source: Ambulatory Visit | Attending: Nurse Practitioner | Admitting: Nurse Practitioner

## 2016-10-27 ENCOUNTER — Encounter (HOSPITAL_COMMUNITY): Payer: Self-pay | Admitting: Nurse Practitioner

## 2016-10-27 ENCOUNTER — Telehealth: Payer: Self-pay | Admitting: Internal Medicine

## 2016-10-27 VITALS — BP 136/58 | HR 54 | Ht 63.0 in | Wt 208.4 lb

## 2016-10-27 DIAGNOSIS — G4733 Obstructive sleep apnea (adult) (pediatric): Secondary | ICD-10-CM | POA: Diagnosis not present

## 2016-10-27 DIAGNOSIS — I480 Paroxysmal atrial fibrillation: Secondary | ICD-10-CM | POA: Insufficient documentation

## 2016-10-27 DIAGNOSIS — J449 Chronic obstructive pulmonary disease, unspecified: Secondary | ICD-10-CM | POA: Insufficient documentation

## 2016-10-27 DIAGNOSIS — Z888 Allergy status to other drugs, medicaments and biological substances status: Secondary | ICD-10-CM | POA: Insufficient documentation

## 2016-10-27 DIAGNOSIS — Z800 Family history of malignant neoplasm of digestive organs: Secondary | ICD-10-CM | POA: Insufficient documentation

## 2016-10-27 DIAGNOSIS — K5900 Constipation, unspecified: Secondary | ICD-10-CM | POA: Insufficient documentation

## 2016-10-27 DIAGNOSIS — M549 Dorsalgia, unspecified: Secondary | ICD-10-CM | POA: Insufficient documentation

## 2016-10-27 DIAGNOSIS — Z7901 Long term (current) use of anticoagulants: Secondary | ICD-10-CM | POA: Insufficient documentation

## 2016-10-27 DIAGNOSIS — Z8249 Family history of ischemic heart disease and other diseases of the circulatory system: Secondary | ICD-10-CM | POA: Insufficient documentation

## 2016-10-27 DIAGNOSIS — I10 Essential (primary) hypertension: Secondary | ICD-10-CM | POA: Insufficient documentation

## 2016-10-27 DIAGNOSIS — F329 Major depressive disorder, single episode, unspecified: Secondary | ICD-10-CM | POA: Insufficient documentation

## 2016-10-27 DIAGNOSIS — I4510 Unspecified right bundle-branch block: Secondary | ICD-10-CM | POA: Diagnosis not present

## 2016-10-27 DIAGNOSIS — Z9889 Other specified postprocedural states: Secondary | ICD-10-CM | POA: Diagnosis not present

## 2016-10-27 DIAGNOSIS — F419 Anxiety disorder, unspecified: Secondary | ICD-10-CM | POA: Insufficient documentation

## 2016-10-27 DIAGNOSIS — Z79899 Other long term (current) drug therapy: Secondary | ICD-10-CM | POA: Insufficient documentation

## 2016-10-27 DIAGNOSIS — I4892 Unspecified atrial flutter: Secondary | ICD-10-CM | POA: Diagnosis not present

## 2016-10-27 DIAGNOSIS — Z880 Allergy status to penicillin: Secondary | ICD-10-CM | POA: Insufficient documentation

## 2016-10-27 DIAGNOSIS — E785 Hyperlipidemia, unspecified: Secondary | ICD-10-CM | POA: Diagnosis not present

## 2016-10-27 DIAGNOSIS — E669 Obesity, unspecified: Secondary | ICD-10-CM | POA: Insufficient documentation

## 2016-10-27 DIAGNOSIS — Z96641 Presence of right artificial hip joint: Secondary | ICD-10-CM | POA: Diagnosis not present

## 2016-10-27 LAB — CBC
HCT: 33.8 % — ABNORMAL LOW (ref 36.0–46.0)
Hemoglobin: 10.8 g/dL — ABNORMAL LOW (ref 12.0–15.0)
MCH: 26.2 pg (ref 26.0–34.0)
MCHC: 32 g/dL (ref 30.0–36.0)
MCV: 81.8 fL (ref 78.0–100.0)
PLATELETS: 191 10*3/uL (ref 150–400)
RBC: 4.13 MIL/uL (ref 3.87–5.11)
RDW: 15.5 % (ref 11.5–15.5)
WBC: 10.3 10*3/uL (ref 4.0–10.5)

## 2016-10-27 NOTE — Progress Notes (Signed)
Primary Care Physician: Monico Blitz, MD Referring Physician: Dr. [name] is a 76 y.o. female with a h/o paroxysmal afib that is here for evaluation in the afib clinic. She has h/o ablation 08/2014. Flecainide was decreased to 50 mg bid from 100 mg bid after ablation due to doing well and later stopped. She did have to go back on flecainide 100mg  bid at a later date for recurrent afib. She is in the afib clinic for paroxysmal afib since last  Saturday. She does not know any specific triggers for this episode. She struggles with her weight and knows that sometimes overeating will trigger afib.  No alcohol or caffeine, uses CPAP. She recently had an upper endoscopy and coumadin was held 5 days the end of May. Her last INR was subtherapeutic per pt. Last cardioversion was in 2015. She has used several of the 30 mg cardizem pills since the weekened and her BP is low at 92/60, although tolerating well. She was set up for TEE cardioversion.  F/u in afib clinic, 7/31, pt has successful cardioversion. Continues on flecainde and warfarin.She is having issues with back pain and is currently seeing a chiropractor. She is also having issues with constipation for the last few days. Did have some breakthrough afib the night of cardioversion but it was short lived.   Today, she denies symptoms of palpitations, chest pain, shortness of breath, orthopnea, PND, lower extremity edema, dizziness, presyncope, syncope, or neurologic sequela.  Positive for back pain and constipation.The patient is tolerating medications without difficulties and is otherwise without complaint today.   Past Medical History:  Diagnosis Date  . Allergic rhinitis   . Anxiety   . Atrial flutter ([place])   . COPD (chronic obstructive pulmonary disease) ([place])   . Depression   . Essential hypertension, benign   . Hyperlipidemia   . Lumbar disc disease   . Obesity   . OSA (obstructive sleep apnea)    CPAP  . Paroxysmal  atrial fibrillation (HCC)    Element of tachycardia bradycardia syndrome  . Respiratory failure [facility])    Past Surgical History:  Procedure Laterality Date  . ANTERIOR FUSION LUMBAR SPINE    . BIOPSY  08/27/2016   Procedure: BIOPSY;  Surgeon: Rogene Houston, MD;  [facility] ENDO SU[facility];  Service: Endoscopy;;  FOUR GASTRIC POLYPS BIOPSIED  . CARDIOVERSION N/A 03/28/2014   Procedure: CARDIOVERSION;  Surgeon: Fay Records, MD;  Location: [facility] ORS;  Service: Cardiovascular;  Laterality: N/A;  . CARDIOVERSION N/A 10/22/2016   Procedure: CARDIOVERSION;  Surgeon: Sueanne Margarita, MD;  Location: [facility] ENDOSCOPY;  Service: Cardiovascular;  Laterality: N/A;  . CATARACT EXTRACTION    . COLONOSCOPY N/A 08/04/2012   Procedure: COLONOSCOPY;  Surgeon: Rogene Houston, MD;  [facility]O [facility];  Service: Endoscopy;  Laterality: N/A;  730-rescheduled to [place] notified pt  . ELECTROPHYSIOLOGIC STUDY N/A 09/18/2014   Procedure: Atrial Fibrillation Ablation;  Surgeon: Thompson Grayer, MD;  Location: [facility] CV LAB;  Service: Cardiovascular;  Laterality: N/A;  . ESOPHAGOGASTRODUODENOSCOPY N/A 08/27/2016   Procedure: ESOPHAGOGASTRODUODENOSCOPY (EGD);  Surgeon: Rogene Houston, MD;  [facility];  Service: Endoscopy;  Laterality: N/A;  1200  . LASIK     Both eyes  . TEE WITHOUT CARDIOVERSION N/A 09/18/2014   Procedure: TRANSESOPHAGEAL ECHOCARDIOGRAM (TEE);  Surgeon: Larey Dresser, MD;  Location: [facility];  Service: Cardiovascular;  Laterality: N/A;  . TEE WITHOUT CARDIOVERSION N/A 10/22/2016  Procedure: TRANSESOPHAGEAL ECHOCARDIOGRAM (TEE);  Surgeon: Sueanne Margarita, MD;  Location: [facility] ENDOSCOPY;  Service: Cardiovascular;  Laterality: N/A;  . TOTAL HIP ARTHROPLASTY  2010   Right    Current Outpatient Prescriptions  Medication Sig Dispense Refill  . acetaminophen (TYLENOL) 500 MG tablet Take 500 mg by mouth daily as needed for headache.    Marland Kitchen antiseptic oral rinse (BIOTENE) LIQD 15 mLs by  Mouth Rinse route as needed for dry mouth.    Marland Kitchen atorvastatin (LIPITOR) 10 MG tablet Take 10 mg by mouth every evening.     . Biotin 1000 MCG tablet Take 1,000 mcg by mouth every morning.     . budesonide (PULMICORT) 0.5 MG/2ML nebulizer solution Take 0.5 mg by nebulization 2 (two) times daily.     . cetirizine (ZYRTEC) 10 MG tablet Take 10 mg by mouth daily as needed for allergies.    Marland Kitchen diltiazem (CARDIZEM) 30 MG tablet Take 1 tablet (30 mg total) by mouth 3 (three) times daily. May take an additional 30 mg every 6 hours as needed for afib    . erythromycin ethylsuccinate (EES) 400 MG tablet Take 1,600 mg by mouth as directed. 1 hour prior to dental procedures    . famotidine (PEPCID) 20 MG tablet Take 1 tablet (20 mg total) by mouth at bedtime.    . flecainide (TAMBOCOR) 100 MG tablet Take 1 tablet (100 mg total) by mouth 2 (two) times daily. 180 tablet 2  . fluticasone (FLONASE) 50 MCG/ACT nasal spray Place 1 spray into both nostrils 2 (two) times daily.    . Hypromellose (ARTIFICIAL TEARS OP) Apply 1 drop to eye every 4 (four) hours as needed (dry eyes).    Marland Kitchen Lifitegrast (XIIDRA) 5 % SOLN Place 1 drop into both eyes every morning.     Marland Kitchen losartan-hydrochlorothiazide (HYZAAR) 100-25 MG tablet Take 1 tablet by mouth daily.  7  . montelukast (SINGULAIR) 10 MG tablet Take 10 mg by mouth at bedtime.    . Multiple Minerals-Vitamins (CALCIUM-MAGNESIUM-ZINC-D3) TABS Take 1 tablet by mouth 2 (two) times daily.    Marland Kitchen omeprazole (PRILOSEC) 40 MG capsule Take 40 mg by mouth every morning.    . ondansetron (ZOFRAN) 4 MG tablet Take 1 tablet (4 mg total) by mouth daily as needed for nausea or vomiting. 20 tablet 0  . predniSONE (DELTASONE) 10 MG tablet Take 1 tablet (10 mg total) by mouth every other day. 20 tablet 4  . umeclidinium-vilanterol (ANORO ELLIPTA) 62.5-25 MCG/INH AEPB Inhale 1 puff into the lungs daily.    Marland Kitchen warfarin (COUMADIN) 5 MG tablet Take 1 tablet (5 mg total) by mouth daily. (Patient taking  differently: Take 2.5-5 mg by mouth every evening. Take 2.5 mg daily on Mon, Wed, and Fri, then take 5mg s daily on Tues, Thur, Sat, and Sun) 45 tablet 3  . baclofen (LIORESAL) 10 MG tablet Take 10 mg by mouth daily as needed for muscle spasms.     Marland Kitchen HYDROcodone-homatropine (HYCODAN) 5-1.5 MG/5ML syrup Take 5 mLs by mouth 3 (three) times daily as needed for cough.  0   Current Facility-Administered Medications  Medication Dose Route Frequency Provider Last Rate Last Dose  . levalbuterol (XOPENEX) nebulizer solution 0.63 mg  0.63 mg Nebulization Q6H Young, Clinton D, MD   0.63 mg at 09/02/16 1027    Allergies  Allergen Reactions  . Bupropion Hcl Other (See Comments)    Suicidal Thoughts.   . Escitalopram Oxalate Other (See Comments)    Suicidal Thoughts.   Marland Kitchen  Lisinopril     cough  . Serevent Other (See Comments)    Caused patient to go into Afib  . Macrodantin [Nitrofurantoin] Rash  . Penicillins Rash    Childhood allergy  Has patient had a PCN reaction causing immediate rash, facial/tongue/throat swelling, SOB or lightheadedness with hypotension: Unknown Has patient had a PCN reaction causing severe rash involving mucus membranes or skin necrosis: Unknown Has patient had a PCN reaction that required hospitalization No Has patient had a PCN reaction occurring within the last 10 years: No If all of the above answers are "NO", then may proceed with Cephalosporin use.     Social History   Social History  . Marital status: Single    Spouse name: N/A  . Number of children: N/A  . Years of education: N/A   Occupational History  . Retired: Presenter, broadcasting    Social History Main Topics  . Smoking status: Never Smoker  . Smokeless tobacco: Never Used  . Alcohol use No  . Drug use: No  . Sexual activity: Not on file   Other Topics Concern  . Not on file   Social History Narrative   Pt lives in [place] alone. She was never married.   Retired Customer service manager.   Attends [employer]    Family History  Problem Relation Age of Onset  . Cancer Mother   . Heart attack Father   . Colon cancer Other     ROS- All systems are reviewed and negative except as per the HPI above  Physical Exam: Vitals:   10/27/16 1056  BP: (!) 136/58  Pulse: (!) 54  Weight: 208 lb 6.4 oz (94.5 kg)  Height: 5\' 3"  (1.6 m)    GEN- The patient is well appearing, alert and oriented x 3 today.   Head- normocephalic, atraumatic Eyes-  Sclera clear, conjunctiva pink Ears- hearing intact Oropharynx- clear Neck- supple, no JVP Lymph- no cervical lymphadenopathy Lungs- Clear to ausculation bilaterally, normal work of breathing Heart- regular rate and rhythm, no murmurs, rubs or gallops, PMI not laterally displaced GI- soft, NT, ND, + BS Extremities- no clubbing, cyanosis, or edema MS- no significant deformity or atrophy Skin- no rash or lesion Psych- euthymic mood, full affect Neuro- strength and sensation are intact  EKG- SR with hard to see P waves at 54 bpm, LAD, qrs int 96 ms, qtc 432 ms  Epic records reviewed CBC today- Hemog-10.8 HCT-33.8  Assessment and Plan: 1. Paroxysmal afib Successful cardioversion Continue flecainide 100 mg bid Continue Cardizem at 120 mg bid Continue warfarin 5 mg qd CBC today shows mild drop in H/H, pt denies any obvious bleeding, recheck in one week with PCP  2. Lifestyles issues Weight loss encouraged Continue gym classes Continue CPAP  3. HTN Stable  4. Back pain Seeing chiropractor Encouraged further f/u with PCP if does not resolve  5. Constipation Encouraged colace, miralax, warm prune juice PCP if not improved  F/u with Dr. Domenic Polite 10/9, and PCP as scheduled  Dr. Rayann Heman 1/4   afib clinic as scheduled   Butch Penny C. Carroll, [facility] [address] [place], [place] [address] [phone]

## 2016-10-28 DIAGNOSIS — M546 Pain in thoracic spine: Secondary | ICD-10-CM | POA: Diagnosis not present

## 2016-10-28 DIAGNOSIS — M9902 Segmental and somatic dysfunction of thoracic region: Secondary | ICD-10-CM | POA: Diagnosis not present

## 2016-10-28 DIAGNOSIS — M9901 Segmental and somatic dysfunction of cervical region: Secondary | ICD-10-CM | POA: Diagnosis not present

## 2016-10-28 DIAGNOSIS — M9903 Segmental and somatic dysfunction of lumbar region: Secondary | ICD-10-CM | POA: Diagnosis not present

## 2016-10-28 DIAGNOSIS — M542 Cervicalgia: Secondary | ICD-10-CM | POA: Diagnosis not present

## 2016-10-28 DIAGNOSIS — M545 Low back pain: Secondary | ICD-10-CM | POA: Diagnosis not present

## 2016-10-30 DIAGNOSIS — M9902 Segmental and somatic dysfunction of thoracic region: Secondary | ICD-10-CM | POA: Diagnosis not present

## 2016-10-30 DIAGNOSIS — M545 Low back pain: Secondary | ICD-10-CM | POA: Diagnosis not present

## 2016-10-30 DIAGNOSIS — M546 Pain in thoracic spine: Secondary | ICD-10-CM | POA: Diagnosis not present

## 2016-10-30 DIAGNOSIS — M542 Cervicalgia: Secondary | ICD-10-CM | POA: Diagnosis not present

## 2016-10-30 DIAGNOSIS — M9901 Segmental and somatic dysfunction of cervical region: Secondary | ICD-10-CM | POA: Diagnosis not present

## 2016-10-30 DIAGNOSIS — M9903 Segmental and somatic dysfunction of lumbar region: Secondary | ICD-10-CM | POA: Diagnosis not present

## 2016-11-02 ENCOUNTER — Telehealth: Payer: Self-pay | Admitting: Internal Medicine

## 2016-11-02 DIAGNOSIS — J449 Chronic obstructive pulmonary disease, unspecified: Secondary | ICD-10-CM | POA: Diagnosis not present

## 2016-11-02 DIAGNOSIS — E7800 Pure hypercholesterolemia, unspecified: Secondary | ICD-10-CM | POA: Diagnosis not present

## 2016-11-02 DIAGNOSIS — I10 Essential (primary) hypertension: Secondary | ICD-10-CM | POA: Diagnosis not present

## 2016-11-02 DIAGNOSIS — M549 Dorsalgia, unspecified: Secondary | ICD-10-CM | POA: Diagnosis not present

## 2016-11-02 DIAGNOSIS — Z6837 Body mass index (BMI) 37.0-37.9, adult: Secondary | ICD-10-CM | POA: Diagnosis not present

## 2016-11-02 DIAGNOSIS — Z713 Dietary counseling and surveillance: Secondary | ICD-10-CM | POA: Diagnosis not present

## 2016-11-02 DIAGNOSIS — Z299 Encounter for prophylactic measures, unspecified: Secondary | ICD-10-CM | POA: Diagnosis not present

## 2016-11-02 DIAGNOSIS — I4891 Unspecified atrial fibrillation: Secondary | ICD-10-CM | POA: Diagnosis not present

## 2016-11-02 NOTE — Telephone Encounter (Signed)
Created in error

## 2016-11-02 NOTE — Telephone Encounter (Signed)
[name] [name] brought to my attention that I forgot to send the PAP form. I sent it 10/27/16 but forgot to put it in the computer. SOB says we have to  [facility], since there is no permanent J code yet, we have to file an appeal. In the meantime if CY agrees, once we've started the appeal we can start her on samples until it goes through. Sending to [facility] to ask CY.

## 2016-11-06 DIAGNOSIS — H04123 Dry eye syndrome of bilateral lacrimal glands: Secondary | ICD-10-CM | POA: Diagnosis not present

## 2016-11-13 DIAGNOSIS — G4730 Sleep apnea, unspecified: Secondary | ICD-10-CM | POA: Diagnosis not present

## 2016-11-13 DIAGNOSIS — J449 Chronic obstructive pulmonary disease, unspecified: Secondary | ICD-10-CM | POA: Diagnosis not present

## 2016-11-13 DIAGNOSIS — G4733 Obstructive sleep apnea (adult) (pediatric): Secondary | ICD-10-CM | POA: Diagnosis not present

## 2016-11-13 DIAGNOSIS — E7800 Pure hypercholesterolemia, unspecified: Secondary | ICD-10-CM | POA: Diagnosis not present

## 2016-11-13 DIAGNOSIS — I4891 Unspecified atrial fibrillation: Secondary | ICD-10-CM | POA: Diagnosis not present

## 2016-11-13 DIAGNOSIS — I10 Essential (primary) hypertension: Secondary | ICD-10-CM | POA: Diagnosis not present

## 2016-11-13 DIAGNOSIS — Z6837 Body mass index (BMI) 37.0-37.9, adult: Secondary | ICD-10-CM | POA: Diagnosis not present

## 2016-11-13 DIAGNOSIS — Z299 Encounter for prophylactic measures, unspecified: Secondary | ICD-10-CM | POA: Diagnosis not present

## 2016-11-13 DIAGNOSIS — M549 Dorsalgia, unspecified: Secondary | ICD-10-CM | POA: Diagnosis not present

## 2016-11-13 DIAGNOSIS — F329 Major depressive disorder, single episode, unspecified: Secondary | ICD-10-CM | POA: Diagnosis not present

## 2016-11-20 DIAGNOSIS — M9902 Segmental and somatic dysfunction of thoracic region: Secondary | ICD-10-CM | POA: Diagnosis not present

## 2016-11-20 DIAGNOSIS — M9903 Segmental and somatic dysfunction of lumbar region: Secondary | ICD-10-CM | POA: Diagnosis not present

## 2016-11-20 DIAGNOSIS — M9901 Segmental and somatic dysfunction of cervical region: Secondary | ICD-10-CM | POA: Diagnosis not present

## 2016-11-20 DIAGNOSIS — M546 Pain in thoracic spine: Secondary | ICD-10-CM | POA: Diagnosis not present

## 2016-11-20 DIAGNOSIS — M545 Low back pain: Secondary | ICD-10-CM | POA: Diagnosis not present

## 2016-11-20 DIAGNOSIS — M542 Cervicalgia: Secondary | ICD-10-CM | POA: Diagnosis not present

## 2016-11-20 NOTE — Telephone Encounter (Signed)
Spoke with CY-lets offer patient Nucala since patient will need buy and bill option. TS will discuss with patient and have her come to sign forms as well.

## 2016-11-25 NOTE — Telephone Encounter (Signed)
I called [name] [name] and explained to her why we couldn't get fasenra for her. Dr. Annamaria Boots wants to try nucala: "It treats the type of asthma you have and it's been around longer." Pt. Is coming in tomorrow to fill out and sign paper. Nothing further needed.

## 2016-11-26 DIAGNOSIS — M9902 Segmental and somatic dysfunction of thoracic region: Secondary | ICD-10-CM | POA: Diagnosis not present

## 2016-11-26 DIAGNOSIS — M545 Low back pain: Secondary | ICD-10-CM | POA: Diagnosis not present

## 2016-11-26 DIAGNOSIS — M9903 Segmental and somatic dysfunction of lumbar region: Secondary | ICD-10-CM | POA: Diagnosis not present

## 2016-11-26 DIAGNOSIS — M546 Pain in thoracic spine: Secondary | ICD-10-CM | POA: Diagnosis not present

## 2016-11-26 DIAGNOSIS — M9901 Segmental and somatic dysfunction of cervical region: Secondary | ICD-10-CM | POA: Diagnosis not present

## 2016-11-26 DIAGNOSIS — M542 Cervicalgia: Secondary | ICD-10-CM | POA: Diagnosis not present

## 2016-11-26 NOTE — Telephone Encounter (Addendum)
Pt. Came in today and signed nucala paper. (CY wanted to start her on fasenra but because we were going to have to buy &  bill b/c of ins. He decided to go with nucala) I faxed form,allergies,meds. And copies of ins cards. Nothing further needed, at this time.

## 2016-12-01 DIAGNOSIS — Z23 Encounter for immunization: Secondary | ICD-10-CM | POA: Diagnosis not present

## 2016-12-04 ENCOUNTER — Telehealth: Payer: Self-pay | Admitting: *Deleted

## 2016-12-04 DIAGNOSIS — M9903 Segmental and somatic dysfunction of lumbar region: Secondary | ICD-10-CM | POA: Diagnosis not present

## 2016-12-04 DIAGNOSIS — M9902 Segmental and somatic dysfunction of thoracic region: Secondary | ICD-10-CM | POA: Diagnosis not present

## 2016-12-04 DIAGNOSIS — M542 Cervicalgia: Secondary | ICD-10-CM | POA: Diagnosis not present

## 2016-12-04 DIAGNOSIS — M545 Low back pain: Secondary | ICD-10-CM | POA: Diagnosis not present

## 2016-12-04 DIAGNOSIS — M9901 Segmental and somatic dysfunction of cervical region: Secondary | ICD-10-CM | POA: Diagnosis not present

## 2016-12-04 DIAGNOSIS — M546 Pain in thoracic spine: Secondary | ICD-10-CM | POA: Diagnosis not present

## 2016-12-04 NOTE — Telephone Encounter (Signed)
I reached out to the pt. Since GT[employer] was getting no response. When I called her she was driving so she gave me her e-mail address and I e-mailed it to her [employer] ph. # [phone]. I will wait for sob(summary of benefits).

## 2016-12-07 ENCOUNTER — Telehealth: Payer: Self-pay | Admitting: Adult Health

## 2016-12-07 NOTE — Telephone Encounter (Signed)
Spoke with the pt  She states that she heard from the drug company for [place], and all they are needing is the page 2 that she signed to be faxed  She states she remembers signing the form for this  I checked in the media tab, but it's not there  Jess- do you still have her forms?

## 2016-12-07 NOTE — Telephone Encounter (Signed)
Patient has never seen Tammy Parrett NP - could this be for [employer]?  Forwarding to TS

## 2016-12-08 NOTE — Telephone Encounter (Signed)
Tammy S, please advise.

## 2016-12-09 ENCOUNTER — Telehealth: Payer: Self-pay | Admitting: Adult Health

## 2016-12-09 NOTE — Telephone Encounter (Signed)
Faxed pg. One and two, so there is hopefully no confusion. Nothing further needed at this time, closing note.

## 2016-12-10 NOTE — Telephone Encounter (Signed)
Yes, I did get the summary of benefits. We will need to buy & bill. No questions for GTN. Nothing further needed.

## 2016-12-10 NOTE — Telephone Encounter (Signed)
We received th sob and the co-pay assistance letter. Pt. Is not eligible for assistance but looking at her the sob BC[place] of [place] will cover what Medicare doesn't pay. I will check with Katie. Nothing further needed.

## 2016-12-10 NOTE — Telephone Encounter (Signed)
TS please update as needed.

## 2016-12-10 NOTE — Telephone Encounter (Signed)
We also received the co-pay letter.

## 2016-12-11 DIAGNOSIS — I4891 Unspecified atrial fibrillation: Secondary | ICD-10-CM | POA: Diagnosis not present

## 2016-12-11 DIAGNOSIS — G250 Essential tremor: Secondary | ICD-10-CM | POA: Diagnosis not present

## 2016-12-11 DIAGNOSIS — M87059 Idiopathic aseptic necrosis of unspecified femur: Secondary | ICD-10-CM | POA: Diagnosis not present

## 2016-12-11 DIAGNOSIS — Z6837 Body mass index (BMI) 37.0-37.9, adult: Secondary | ICD-10-CM | POA: Diagnosis not present

## 2016-12-11 DIAGNOSIS — J449 Chronic obstructive pulmonary disease, unspecified: Secondary | ICD-10-CM | POA: Diagnosis not present

## 2016-12-11 DIAGNOSIS — I10 Essential (primary) hypertension: Secondary | ICD-10-CM | POA: Diagnosis not present

## 2016-12-11 DIAGNOSIS — E7800 Pure hypercholesterolemia, unspecified: Secondary | ICD-10-CM | POA: Diagnosis not present

## 2016-12-11 DIAGNOSIS — Z299 Encounter for prophylactic measures, unspecified: Secondary | ICD-10-CM | POA: Diagnosis not present

## 2016-12-11 DIAGNOSIS — F329 Major depressive disorder, single episode, unspecified: Secondary | ICD-10-CM | POA: Diagnosis not present

## 2016-12-11 DIAGNOSIS — G4733 Obstructive sleep apnea (adult) (pediatric): Secondary | ICD-10-CM | POA: Diagnosis not present

## 2016-12-21 ENCOUNTER — Ambulatory Visit: Payer: Medicare Other

## 2016-12-21 ENCOUNTER — Telehealth: Payer: Self-pay | Admitting: Internal Medicine

## 2016-12-21 DIAGNOSIS — Z6837 Body mass index (BMI) 37.0-37.9, adult: Secondary | ICD-10-CM | POA: Diagnosis not present

## 2016-12-21 DIAGNOSIS — Z299 Encounter for prophylactic measures, unspecified: Secondary | ICD-10-CM | POA: Diagnosis not present

## 2016-12-21 DIAGNOSIS — L0291 Cutaneous abscess, unspecified: Secondary | ICD-10-CM | POA: Diagnosis not present

## 2016-12-21 DIAGNOSIS — I4891 Unspecified atrial fibrillation: Secondary | ICD-10-CM | POA: Diagnosis not present

## 2016-12-21 DIAGNOSIS — L0390 Cellulitis, unspecified: Secondary | ICD-10-CM | POA: Diagnosis not present

## 2016-12-21 MED ORDER — EPINEPHRINE 0.3 MG/0.3ML IJ SOAJ: 0.3000 mg | Freq: Once | INTRAMUSCULAR | 11 refills | Status: AC

## 2016-12-21 NOTE — Telephone Encounter (Signed)
Ok to order Epipen    Generic ok     # 1, refill x 12  Inject into thigh for severe allergic reaction

## 2016-12-21 NOTE — Telephone Encounter (Signed)
Spoke with pt, advised that refill was sent it. Pt understood and nothing further is needed.

## 2016-12-21 NOTE — Telephone Encounter (Signed)
Spoke with pt, she states she is unable to get Nucala without an EpIpen and would like CY to send one into [facility]. I advised her that I did not know much about allergy medications so I would forward this message to CY. Please advise.

## 2016-12-22 ENCOUNTER — Telehealth: Payer: Self-pay | Admitting: Internal Medicine

## 2016-12-22 ENCOUNTER — Ambulatory Visit (INDEPENDENT_AMBULATORY_CARE_PROVIDER_SITE_OTHER): Payer: Medicare Other

## 2016-12-22 DIAGNOSIS — J4550 Severe persistent asthma, uncomplicated: Secondary | ICD-10-CM

## 2016-12-22 NOTE — Telephone Encounter (Signed)
Spoke with pt, she states she has A-Fib and she has this every so often and she knows when it happens every so often. She takes Cardizem 4 times a day when she has episodes and it takes a day or so to come out of it. She wants to know if she can still receive the Nucala injection? CY please advise.

## 2016-12-22 NOTE — Telephone Encounter (Signed)
I don't expect any problems with Nucala. She can keep managing her AFib as before.

## 2016-12-22 NOTE — Telephone Encounter (Signed)
Pt is aware of results and voiced her understanding. Nothing further needed.  

## 2016-12-23 ENCOUNTER — Telehealth (HOSPITAL_COMMUNITY): Payer: Self-pay | Admitting: *Deleted

## 2016-12-23 NOTE — Telephone Encounter (Signed)
Patient called in stating she has been in afib since Monday morning -- HR ranging from 90-120s. Taking PRN cardizem every 4 hours but still heart rates are not going below 90. Seems more SOB on phone. Pt states she thinks its related to severe constipation and is "working on this with OTC medication." Offered pt appt today but pt wants to wait til tomorrow and see if things improve. Pt states she has no increase in weight or swelling in extremities that she notes. Pt will call tomorrow if still in afib to be seen.

## 2016-12-24 ENCOUNTER — Ambulatory Visit (HOSPITAL_COMMUNITY)
Admission: RE | Admit: 2016-12-24 | Discharge: 2016-12-24 | Disposition: A | Discharge status: Home / Self Care | Payer: Medicare Other | Source: Ambulatory Visit | Attending: Nurse Practitioner | Admitting: Nurse Practitioner

## 2016-12-24 ENCOUNTER — Encounter (HOSPITAL_COMMUNITY): Payer: Self-pay | Admitting: Nurse Practitioner

## 2016-12-24 VITALS — BP 114/72 | HR 118 | Ht 63.0 in | Wt 204.4 lb

## 2016-12-24 DIAGNOSIS — Z6837 Body mass index (BMI) 37.0-37.9, adult: Secondary | ICD-10-CM | POA: Diagnosis not present

## 2016-12-24 DIAGNOSIS — Z800 Family history of malignant neoplasm of digestive organs: Secondary | ICD-10-CM | POA: Diagnosis not present

## 2016-12-24 DIAGNOSIS — F419 Anxiety disorder, unspecified: Secondary | ICD-10-CM | POA: Insufficient documentation

## 2016-12-24 DIAGNOSIS — F329 Major depressive disorder, single episode, unspecified: Secondary | ICD-10-CM | POA: Diagnosis not present

## 2016-12-24 DIAGNOSIS — E669 Obesity, unspecified: Secondary | ICD-10-CM | POA: Diagnosis not present

## 2016-12-24 DIAGNOSIS — I4892 Unspecified atrial flutter: Secondary | ICD-10-CM | POA: Diagnosis not present

## 2016-12-24 DIAGNOSIS — Z8249 Family history of ischemic heart disease and other diseases of the circulatory system: Secondary | ICD-10-CM | POA: Diagnosis not present

## 2016-12-24 DIAGNOSIS — K5900 Constipation, unspecified: Secondary | ICD-10-CM | POA: Insufficient documentation

## 2016-12-24 DIAGNOSIS — I481 Persistent atrial fibrillation: Secondary | ICD-10-CM | POA: Diagnosis not present

## 2016-12-24 DIAGNOSIS — Z9889 Other specified postprocedural states: Secondary | ICD-10-CM | POA: Diagnosis not present

## 2016-12-24 DIAGNOSIS — Z79899 Other long term (current) drug therapy: Secondary | ICD-10-CM | POA: Diagnosis not present

## 2016-12-24 DIAGNOSIS — Z880 Allergy status to penicillin: Secondary | ICD-10-CM | POA: Insufficient documentation

## 2016-12-24 DIAGNOSIS — Z7952 Long term (current) use of systemic steroids: Secondary | ICD-10-CM | POA: Insufficient documentation

## 2016-12-24 DIAGNOSIS — R9431 Abnormal electrocardiogram [ECG] [EKG]: Secondary | ICD-10-CM | POA: Diagnosis not present

## 2016-12-24 DIAGNOSIS — Z96641 Presence of right artificial hip joint: Secondary | ICD-10-CM | POA: Diagnosis not present

## 2016-12-24 DIAGNOSIS — I10 Essential (primary) hypertension: Secondary | ICD-10-CM | POA: Insufficient documentation

## 2016-12-24 DIAGNOSIS — J449 Chronic obstructive pulmonary disease, unspecified: Secondary | ICD-10-CM | POA: Diagnosis not present

## 2016-12-24 DIAGNOSIS — Z7901 Long term (current) use of anticoagulants: Secondary | ICD-10-CM | POA: Insufficient documentation

## 2016-12-24 DIAGNOSIS — Z888 Allergy status to other drugs, medicaments and biological substances status: Secondary | ICD-10-CM | POA: Diagnosis not present

## 2016-12-24 DIAGNOSIS — G4733 Obstructive sleep apnea (adult) (pediatric): Secondary | ICD-10-CM | POA: Insufficient documentation

## 2016-12-24 DIAGNOSIS — I480 Paroxysmal atrial fibrillation: Secondary | ICD-10-CM | POA: Diagnosis not present

## 2016-12-24 DIAGNOSIS — Z299 Encounter for prophylactic measures, unspecified: Secondary | ICD-10-CM | POA: Diagnosis not present

## 2016-12-24 DIAGNOSIS — I4819 Other persistent atrial fibrillation: Secondary | ICD-10-CM

## 2016-12-24 DIAGNOSIS — M4326 Fusion of spine, lumbar region: Secondary | ICD-10-CM | POA: Insufficient documentation

## 2016-12-24 DIAGNOSIS — Z713 Dietary counseling and surveillance: Secondary | ICD-10-CM | POA: Diagnosis not present

## 2016-12-24 DIAGNOSIS — E785 Hyperlipidemia, unspecified: Secondary | ICD-10-CM | POA: Insufficient documentation

## 2016-12-24 MED ORDER — DILTIAZEM HCL ER COATED BEADS 120 MG PO CP24
120.0000 mg | ORAL_CAPSULE | Freq: Every day | ORAL | 11 refills | Status: DC
Start: 2016-12-24 — End: 2017-02-10

## 2016-12-24 MED ORDER — MEPOLIZUMAB 100 MG ~~LOC~~ SOLR
100.0000 mg | SUBCUTANEOUS | Status: DC
  Administered 2016-12-22: 100 mg via SUBCUTANEOUS

## 2016-12-24 NOTE — Progress Notes (Signed)
Primary Care Physician: Monico Blitz, MD Referring Physician: Dr. [name] is a 76 y.o. female with a h/o paroxysmal afib that is here for evaluation in the afib clinic. She has h/o ablation 08/2014. Flecainide was decreased to 50 mg bid from 100 mg bid after ablation due to doing well and later stopped. She did have to go back on flecainide 100mg  bid at a later date for recurrent afib. She is in the afib clinic for paroxysmal afib since last  Monday. She had a cardioversion 7/17, at which time she felt like constipation had precipitated. This was successful . She struggles with her weight. She has recently became constipated again and saw PCP this am for this and got mag citrate.   No alcohol or caffeine, uses CPAP.  She was taken off 120 mg Cardizem daily several months ago due to being bradycardic and takes several 30 mg tabs a day now. Continues on flecainde and warfarin. Feels if the constipation straightens out, SR will return.  Today, she denies symptoms of  chest pain,  orthopnea, PND, lower extremity edema, dizziness, presyncope, syncope, or neurologic sequela.  Positive for shortness of breath, fatigue, constipation.The patient is tolerating medications without difficulties and is otherwise without complaint today.   Past Medical History:  Diagnosis Date  . Allergic rhinitis   . Anxiety   . Atrial flutter ([place])   . COPD (chronic obstructive pulmonary disease) ([place])   . Depression   . Essential hypertension, benign   . Hyperlipidemia   . Lumbar disc disease   . Obesity   . OSA (obstructive sleep apnea)    CPAP  . Paroxysmal atrial fibrillation (HCC)    Element of tachycardia bradycardia syndrome  . Respiratory failure [facility])    Past Surgical History:  Procedure Laterality Date  . ANTERIOR FUSION LUMBAR SPINE    . BIOPSY  08/27/2016   Procedure: BIOPSY;  Surgeon: Rogene Houston, MD;  [facility];  Service: Endoscopy;;  FOUR GASTRIC POLYPS  BIOPSIED  . CARDIOVERSION N/A 03/28/2014   Procedure: CARDIOVERSION;  Surgeon: Fay Records, MD;  Location: [facility] ORS;  Service: Cardiovascular;  Laterality: N/A;  . CARDIOVERSION N/A 10/22/2016   Procedure: CARDIOVERSION;  Surgeon: Sueanne Margarita, MD;  Location: [facility] ENDOSCOPY;  Service: Cardiovascular;  Laterality: N/A;  . CATARACT EXTRACTION    . COLONOSCOPY N/A 08/04/2012   Procedure: COLONOSCOPY;  Surgeon: Rogene Houston, MD;  [facility];  Service: Endoscopy;  Laterality: N/A;  730-rescheduled to [place] notified pt  . ELECTROPHYSIOLOGIC STUDY N/A 09/18/2014   Procedure: Atrial Fibrillation Ablation;  Surgeon: Thompson Grayer, MD;  Location: [place] CV LAB;  Service: Cardiovascular;  Laterality: N/A;  . ESOPHAGOGASTRODUODENOSCOPY N/A 08/27/2016   Procedure: ESOPHAGOGASTRODUODENOSCOPY (EGD);  Surgeon: Rogene Houston, MD;  [facility];  Service: Endoscopy;  Laterality: N/A;  1200  . LASIK     Both eyes  . TEE WITHOUT CARDIOVERSION N/A 09/18/2014   Procedure: TRANSESOPHAGEAL ECHOCARDIOGRAM (TEE);  Surgeon: Larey Dresser, MD;  Location: [place];  Service: Cardiovascular;  Laterality: N/A;  . TEE WITHOUT CARDIOVERSION N/A 10/22/2016   Procedure: TRANSESOPHAGEAL ECHOCARDIOGRAM (TEE);  Surgeon: Sueanne Margarita, MD;  Location: [facility] ENDOSCOPY;  Service: Cardiovascular;  Laterality: N/A;  . TOTAL HIP ARTHROPLASTY  2010   Right    Current Outpatient Prescriptions  Medication Sig Dispense Refill  . acetaminophen (TYLENOL) 500 MG tablet Take 500 mg by mouth daily  as needed for headache.    Marland Kitchen antiseptic oral rinse (BIOTENE) LIQD 15 mLs by Mouth Rinse route as needed for dry mouth.    Marland Kitchen atorvastatin (LIPITOR) 10 MG tablet Take 10 mg by mouth every evening.     . baclofen (LIORESAL) 10 MG tablet Take 10 mg by mouth daily as needed for muscle spasms.     . Biotin 1000 MCG tablet Take 1,000 mcg by mouth every morning.     . budesonide (PULMICORT) 0.5 MG/2ML nebulizer solution  Take 0.5 mg by nebulization 2 (two) times daily.     . cetirizine (ZYRTEC) 10 MG tablet Take 10 mg by mouth daily as needed for allergies.    Marland Kitchen diltiazem (CARDIZEM) 30 MG tablet Take 1 tablet (30 mg total) by mouth 3 (three) times daily. May take an additional 30 mg every 6 hours as needed for afib    . erythromycin ethylsuccinate (EES) 400 MG tablet Take 1,600 mg by mouth as directed. 1 hour prior to dental procedures    . famotidine (PEPCID) 20 MG tablet Take 1 tablet (20 mg total) by mouth at bedtime.    . flecainide (TAMBOCOR) 100 MG tablet Take 1 tablet (100 mg total) by mouth 2 (two) times daily. 180 tablet 2  . fluticasone (FLONASE) 50 MCG/ACT nasal spray Place 1 spray into both nostrils 2 (two) times daily.    Marland Kitchen HYDROcodone-homatropine (HYCODAN) 5-1.5 MG/5ML syrup Take 5 mLs by mouth 3 (three) times daily as needed for cough.  0  . Hypromellose (ARTIFICIAL TEARS OP) Apply 1 drop to eye every 4 (four) hours as needed (dry eyes).    Marland Kitchen Lifitegrast (XIIDRA) 5 % SOLN Place 1 drop into both eyes every morning.     Marland Kitchen losartan-hydrochlorothiazide (HYZAAR) 100-25 MG tablet Take 1 tablet by mouth daily.  7  . montelukast (SINGULAIR) 10 MG tablet Take 10 mg by mouth at bedtime.    . Multiple Minerals-Vitamins (CALCIUM-MAGNESIUM-ZINC-D3) TABS Take 1 tablet by mouth 2 (two) times daily.    Marland Kitchen omeprazole (PRILOSEC) 40 MG capsule Take 40 mg by mouth every morning.    . ondansetron (ZOFRAN) 4 MG tablet Take 1 tablet (4 mg total) by mouth daily as needed for nausea or vomiting. 20 tablet 0  . predniSONE (DELTASONE) 10 MG tablet Take 1 tablet (10 mg total) by mouth every other day. 20 tablet 4  . umeclidinium-vilanterol (ANORO ELLIPTA) 62.5-25 MCG/INH AEPB Inhale 1 puff into the lungs daily.    Marland Kitchen warfarin (COUMADIN) 5 MG tablet Take 1 tablet (5 mg total) by mouth daily. (Patient taking differently: Take 2.5-5 mg by mouth every evening. Take 2.5 mg daily on Mon, Wed, and Fri, then take 5mg s daily on Tues, Thur,  Sat, and Sun) 45 tablet 3  . diltiazem (CARDIZEM CD) 120 MG 24 hr capsule Take 1 capsule (120 mg total) by mouth daily. 30 capsule 11   Current Facility-Administered Medications  Medication Dose Route Frequency Provider Last Rate Last Dose  . levalbuterol (XOPENEX) nebulizer solution 0.63 mg  0.63 mg Nebulization Q6H Baird Lyons D, MD   0.63 mg at 09/02/16 1027  . Mepolizumab SOLR 100 mg  100 mg Subcutaneous Q28 days Baird Lyons D, MD   100 mg at 12/22/16 1059    Allergies  Allergen Reactions  . Bupropion Hcl Other (See Comments)    Suicidal Thoughts.   . Escitalopram Oxalate Other (See Comments)    Suicidal Thoughts.   . Lisinopril     cough  .  Serevent Other (See Comments)    Caused patient to go into Afib  . Macrodantin [Nitrofurantoin] Rash  . Penicillins Rash    Childhood allergy  Has patient had a PCN reaction causing immediate rash, facial/tongue/throat swelling, SOB or lightheadedness with hypotension: Unknown Has patient had a PCN reaction causing severe rash involving mucus membranes or skin necrosis: Unknown Has patient had a PCN reaction that required hospitalization No Has patient had a PCN reaction occurring within the last 10 years: No If all of the above answers are "NO", then may proceed with Cephalosporin use.     Social History   Social History  . Marital status: Single    Spouse name: N/A  . Number of children: N/A  . Years of education: N/A   Occupational History  . Retired: Presenter, broadcasting    Social History Main Topics  . Smoking status: Never Smoker  . Smokeless tobacco: Never Used  . Alcohol use No  . Drug use: No  . Sexual activity: Not on file   Other Topics Concern  . Not on file   Social History Narrative   Pt lives in [place] alone. She was never married.   Retired Customer service manager.   Attends [employer]    Family History  Problem Relation Age of Onset  . Cancer Mother   . Heart attack Father   . Colon cancer  Other     ROS- All systems are reviewed and negative except as per the HPI above  Physical Exam: Vitals:   12/24/16 1531  BP: 114/72  Pulse: (!) 118  Weight: 204 lb 6.4 oz (92.7 kg)  Height: 5\' 3"  (1.6 m)    GEN- The patient is well appearing, alert and oriented x 3 today.   Head- normocephalic, atraumatic Eyes-  Sclera clear, conjunctiva pink Ears- hearing intact Oropharynx- clear Neck- supple, no JVP Lymph- no cervical lymphadenopathy Lungs- Clear to ausculation bilaterally, normal work of breathing Heart- rapaidegular rate and rhythm, no murmurs, rubs or gallops, PMI not laterally displaced GI- soft, NT, ND, + BS Extremities- no clubbing, cyanosis, or edema MS- no significant deformity or atrophy Skin- no rash or lesion Psych- euthymic mood, full affect Neuro- strength and sensation are intact  EKG-  Shows atrial flutter with IVCD at 118 bpm, LAD, qrs int 126 ms, rate at 118 bpm Epic records reviewed   Assessment and Plan: 1. Perisitent aflutter Start back on Cardizem 120 mg and can use 30 mg every 4 hours for HR over 100 bpm Continue flecainide 100 mg bid Continue warfarin 5 mg qd for chadsvasc score of 4 I hate to schedule another cardioversion as we just did one a couple months ago May have to discuss changing antiarrythmic or another ablation if arrhythmia persisits  2. Lifestyles issues Weight loss encouraged Continue gym classes Continue CPAP  3. HTN Stable  4. Constipation Per PCP Mag citrate per pt  F/u Tuesday in afib clinic Dr. Rayann Heman 1/4      Geroge Baseman. Zane Pellecchia, [facility] [address] [place], [place] [address] [phone]

## 2016-12-24 NOTE — Patient Instructions (Signed)
Your physician has recommended you make the following change in your medication:  1)Start Cardizem 120mg  once a day -- if blood pressure is low hold losartan if bp better in the morning take the losartan in the morning.

## 2016-12-28 ENCOUNTER — Telehealth (HOSPITAL_COMMUNITY): Payer: Self-pay | Admitting: *Deleted

## 2016-12-28 NOTE — Telephone Encounter (Signed)
Patient called in stating she returned to [facility] on Saturday evening HR has been in the 60s since feels a lot better. Patient is continuing 120mg  of cardizem daily.  Discussed with [name] Palau NP recommends staying on Cardizem 120mg  daily and seeing Dr. Domenic [name] for follow up as scheduled and call as needed. Pt in agreement.

## 2016-12-29 ENCOUNTER — Ambulatory Visit (HOSPITAL_COMMUNITY): Payer: Medicare Other | Admitting: Nurse Practitioner

## 2017-01-04 DIAGNOSIS — M9903 Segmental and somatic dysfunction of lumbar region: Secondary | ICD-10-CM | POA: Diagnosis not present

## 2017-01-04 DIAGNOSIS — M545 Low back pain: Secondary | ICD-10-CM | POA: Diagnosis not present

## 2017-01-04 DIAGNOSIS — M9902 Segmental and somatic dysfunction of thoracic region: Secondary | ICD-10-CM | POA: Diagnosis not present

## 2017-01-04 DIAGNOSIS — M9901 Segmental and somatic dysfunction of cervical region: Secondary | ICD-10-CM | POA: Diagnosis not present

## 2017-01-04 DIAGNOSIS — M546 Pain in thoracic spine: Secondary | ICD-10-CM | POA: Diagnosis not present

## 2017-01-04 DIAGNOSIS — M542 Cervicalgia: Secondary | ICD-10-CM | POA: Diagnosis not present

## 2017-01-05 ENCOUNTER — Encounter: Payer: Self-pay | Admitting: Cardiology

## 2017-01-05 ENCOUNTER — Ambulatory Visit (INDEPENDENT_AMBULATORY_CARE_PROVIDER_SITE_OTHER): Payer: Medicare Other | Admitting: Cardiology

## 2017-01-05 VITALS — BP 104/60 | HR 52 | Ht 63.0 in | Wt 206.0 lb

## 2017-01-05 DIAGNOSIS — I480 Paroxysmal atrial fibrillation: Secondary | ICD-10-CM

## 2017-01-05 DIAGNOSIS — G4733 Obstructive sleep apnea (adult) (pediatric): Secondary | ICD-10-CM

## 2017-01-05 DIAGNOSIS — I495 Sick sinus syndrome: Secondary | ICD-10-CM

## 2017-01-05 DIAGNOSIS — I10 Essential (primary) hypertension: Secondary | ICD-10-CM | POA: Diagnosis not present

## 2017-01-05 DIAGNOSIS — J449 Chronic obstructive pulmonary disease, unspecified: Secondary | ICD-10-CM | POA: Diagnosis not present

## 2017-01-05 NOTE — Progress Notes (Signed)
Cardiology Office Note  Date: 01/05/2017   ID: [name] [name], DOB 09-05-1940, MRN [id number]  PCP: Monico Blitz, MD  Primary Cardiologist: Rozann Lesches, MD   Chief Complaint  Patient presents with  . Atrial arrhythmias    History of Present Illness: [name] [name] is a 76 y.o. female last seen in April. She has maintained interval follow-up in the [facility], in fact was just recently seen in late September. She underwent a cardioversion in July, and has continued on flecainide and Coumadin. Unfortunately, she was in atrial flutter at her most recent visit in September, was placed back on Cardizem CD 120 mg daily with as needed short acting 30 mg tablets.  She presents today for a routine follow-up visit. I reviewed her home blood pressure and heart rate checks. It looks like she was in atrial flutter for about a week and then spontaneously converted. She does feel better now, less short of breath. She reports compliance with her medications including the addition of Cardizem CD. She is bradycardic as before, but is not had any lightheadedness or syncope.  I personally reviewed her ECG today which shows sinus bradycardia with sinus arrhythmia, heart rate 47 bpm.  Past Medical History:  Diagnosis Date  . Allergic rhinitis   . Anxiety   . Atrial flutter ([place])   . COPD (chronic obstructive pulmonary disease) ([place])   . Depression   . Essential hypertension   . Hyperlipidemia   . Lumbar disc disease   . Obesity   . OSA (obstructive sleep apnea)    CPAP  . Paroxysmal atrial fibrillation ([facility])    Element of tachycardia bradycardia syndrome  . Respiratory failure [facility])     Past Surgical History:  Procedure Laterality Date  . ANTERIOR FUSION LUMBAR SPINE    . BIOPSY  08/27/2016   Procedure: BIOPSY;  Surgeon: Rogene Houston, MD;  Location: [facility] ENDO [facility];  Service: Endoscopy;;  FOUR GASTRIC POLYPS BIOPSIED  . CARDIOVERSION N/A 03/28/2014   Procedure:  CARDIOVERSION;  Surgeon: Fay Records, MD;  Location: [facility] ORS;  Service: Cardiovascular;  Laterality: N/A;  . CARDIOVERSION N/A 10/22/2016   Procedure: CARDIOVERSION;  Surgeon: Sueanne Margarita, MD;  Location: [facility] ENDOSCOPY;  Service: Cardiovascular;  Laterality: N/A;  . CATARACT EXTRACTION    . COLONOSCOPY N/A 08/04/2012   Procedure: COLONOSCOPY;  Surgeon: Rogene Houston, MD;  Location: [facility];  Service: Endoscopy;  Laterality: N/A;  730-rescheduled to [place] notified pt  . ELECTROPHYSIOLOGIC STUDY N/A 09/18/2014   Procedure: Atrial Fibrillation Ablation;  Surgeon: Thompson Grayer, MD;  Location: [place] CV LAB;  Service: Cardiovascular;  Laterality: N/A;  . ESOPHAGOGASTRODUODENOSCOPY N/A 08/27/2016   Procedure: ESOPHAGOGASTRODUODENOSCOPY (EGD);  Surgeon: Rogene Houston, MD;  [facility];  Service: Endoscopy;  Laterality: N/A;  1200  . LASIK     Both eyes  . TEE WITHOUT CARDIOVERSION N/A 09/18/2014   Procedure: TRANSESOPHAGEAL ECHOCARDIOGRAM (TEE);  Surgeon: Larey Dresser, MD;  Location: [place];  Service: Cardiovascular;  Laterality: N/A;  . TEE WITHOUT CARDIOVERSION N/A 10/22/2016   Procedure: TRANSESOPHAGEAL ECHOCARDIOGRAM (TEE);  Surgeon: Sueanne Margarita, MD;  Location: [facility] ENDOSCOPY;  Service: Cardiovascular;  Laterality: N/A;  . TOTAL HIP ARTHROPLASTY  2010   Right    Current Outpatient Prescriptions  Medication Sig Dispense Refill  . acetaminophen (TYLENOL) 500 MG tablet Take 500 mg by mouth daily as needed for headache.    Marland Kitchen antiseptic oral rinse (BIOTENE)  LIQD 15 mLs by Mouth Rinse route as needed for dry mouth.    Marland Kitchen atorvastatin (LIPITOR) 10 MG tablet Take 10 mg by mouth every evening.     . baclofen (LIORESAL) 10 MG tablet Take 10 mg by mouth daily as needed for muscle spasms.     . Biotin 1000 MCG tablet Take 1,000 mcg by mouth every morning.     . budesonide (PULMICORT) 0.5 MG/2ML nebulizer solution Take 0.5 mg by nebulization 2 (two) times daily.     .  cetirizine (ZYRTEC) 10 MG tablet Take 10 mg by mouth daily as needed for allergies.    . cloNIDine (CATAPRES) 0.1 MG tablet Take 0.1 mg by mouth daily.    Marland Kitchen diltiazem (CARDIZEM CD) 120 MG 24 hr capsule Take 1 capsule (120 mg total) by mouth daily. 30 capsule 11  . diltiazem (CARDIZEM) 30 MG tablet Take 1 tablet (30 mg total) by mouth 3 (three) times daily. May take an additional 30 mg every 6 hours as needed for afib    . erythromycin ethylsuccinate (EES) 400 MG tablet Take 1,600 mg by mouth as directed. 1 hour prior to dental procedures    . famotidine (PEPCID) 20 MG tablet Take 1 tablet (20 mg total) by mouth at bedtime.    . flecainide (TAMBOCOR) 100 MG tablet Take 1 tablet (100 mg total) by mouth 2 (two) times daily. 180 tablet 2  . fluticasone (FLONASE) 50 MCG/ACT nasal spray Place 1 spray into both nostrils 2 (two) times daily.    Marland Kitchen HYDROcodone-homatropine (HYCODAN) 5-1.5 MG/5ML syrup Take 5 mLs by mouth 3 (three) times daily as needed for cough.  0  . Hypromellose (ARTIFICIAL TEARS OP) Apply 1 drop to eye every 4 (four) hours as needed (dry eyes).    Marland Kitchen Lifitegrast (XIIDRA) 5 % SOLN Place 1 drop into both eyes every morning.     Marland Kitchen losartan-hydrochlorothiazide (HYZAAR) 100-25 MG tablet Take 1 tablet by mouth daily.  7  . montelukast (SINGULAIR) 10 MG tablet Take 10 mg by mouth at bedtime.    . Multiple Minerals-Vitamins (CALCIUM-MAGNESIUM-ZINC-D3) TABS Take 1 tablet by mouth 2 (two) times daily.    Marland Kitchen omeprazole (PRILOSEC) 40 MG capsule Take 40 mg by mouth every morning.    . ondansetron (ZOFRAN) 4 MG tablet Take 1 tablet (4 mg total) by mouth daily as needed for nausea or vomiting. 20 tablet 0  . predniSONE (DELTASONE) 10 MG tablet Take 1 tablet (10 mg total) by mouth every other day. 20 tablet 4  . umeclidinium-vilanterol (ANORO ELLIPTA) 62.5-25 MCG/INH AEPB Inhale 1 puff into the lungs daily.    Marland Kitchen warfarin (COUMADIN) 5 MG tablet Take 1 tablet (5 mg total) by mouth daily. (Patient taking  differently: Take 2.5-5 mg by mouth every evening. Take 2.5 mg daily on Mon, Wed, and Fri, then take 5mg s daily on Tues, Thur, Sat, and Sun) 45 tablet 3   Current Facility-Administered Medications  Medication Dose Route Frequency Provider Last Rate Last Dose  . levalbuterol (XOPENEX) nebulizer solution 0.63 mg  0.63 mg Nebulization Q6H Baird Lyons D, MD   0.63 mg at 09/02/16 1027  . Mepolizumab SOLR 100 mg  100 mg Subcutaneous Q28 days Baird Lyons D, MD   100 mg at 12/22/16 1059   Allergies:  Bupropion hcl; Escitalopram oxalate; Lisinopril; Serevent; Macrodantin [nitrofurantoin]; and Penicillins   Social History: The patient  reports that she has never smoked. She has never used smokeless tobacco. She reports that she does  not drink alcohol or use drugs.   ROS:  Please see the history of present illness. Otherwise, complete review of systems is positive for heat intolerance, intermittent wheezing in the high-humidity.  All other systems are reviewed and negative.   Physical Exam: VS:  BP 104/60   Pulse (!) 52   Ht 5\' 3"  (1.6 m)   Wt 206 lb (93.4 kg)   SpO2 98%   BMI 36.49 kg/m , BMI Body mass index is 36.49 kg/m.  Wt Readings from Last 3 Encounters:  01/05/17 206 lb (93.4 kg)  12/24/16 204 lb 6.4 oz (92.7 kg)  10/27/16 208 lb 6.4 oz (94.5 kg)    General: Obese woman, appears comfortable at rest. HEENT: Conjunctiva and lids normal, oropharynx clear. Neck: Supple, no elevated JVP or carotid bruits, no thyromegaly. Lungs: No wheezing, nonlabored breathing at rest. Cardiac: Distant heart sounds, RRR, no S3, no pericardial rub. Abdomen: Soft, nontender, bowel sounds present, no guarding or rebound. Extremities: No pitting edema, distal pulses 2+. Skin: Warm and dry. Musculoskeletal: No kyphosis. Neuropsychiatric: Alert and oriented x3, affect grossly appropriate.  ECG: I personally reviewed the tracing from 12/24/2016 which showed probable atrial flutter with 2:1 block, left  anterior fascicular block, increased voltage.  Recent Labwork: 06/04/2016: ALT 16; AST 26; B Natriuretic Peptide 79.0 10/13/2016: BUN 22; Creatinine, Ser 1.11 10/22/2016: Potassium 3.5; Sodium 138 10/27/2016: Hemoglobin 10.8; Platelets 191   Other Studies Reviewed Today:  TEE 10/22/2016: Study Conclusions  - Left ventricle: Systolic function was normal. The estimated   ejection fraction was in the range of 55% to 60%. Wall motion was   normal; there were no regional wall motion abnormalities. - Left atrium: The atrium was moderately dilated. No evidence of   thrombus in the atrial cavity or appendage. - Right atrium: The atrium was moderately dilated. No evidence of   thrombus in the atrial cavity or appendage. - Tricuspid valve: There was moderate regurgitation.  Assessment and Plan:  1. Paroxysmal atrial fibrillation and flutter, currently maintaining sinus rhythm after a one-week breakthrough event in late September. Plan is to continue Coumadin for stroke prophylaxis, flecainide, and Cardizem CD. She is tolerating rate control strategy with re-addition of calcium channel blocker on a standing basis despite a component of sick sinus syndrome and bradycardia at baseline when in sinus rhythm. She does not report any lightheadedness or syncope.  2. COPD, continues to follow with Dr. Manuella Ghazi. No recent wheezing, cough, fevers or chills.  3. OSA on CPAP. Continues to follow with Dr. Annamaria Boots. She reports compliance.  4. Essential hypertension, blood pressure is well controlled today.  Current medicines were reviewed with the patient today.   Orders Placed This Encounter  Procedures  . EKG 12-Lead    Disposition: Keep follow-up in the [facility]. Routine visit with me in 6 months.  Signed, Satira Sark, MD, [facility] 01/05/2017 9:07 AM    [place] [facility] [place], [place], [place] [address] Phone: [phone]; Fax: [phone]

## 2017-01-05 NOTE — Patient Instructions (Signed)

## 2017-01-06 DIAGNOSIS — H1013 Acute atopic conjunctivitis, bilateral: Secondary | ICD-10-CM | POA: Diagnosis not present

## 2017-01-06 DIAGNOSIS — H04121 Dry eye syndrome of right lacrimal gland: Secondary | ICD-10-CM | POA: Diagnosis not present

## 2017-01-11 ENCOUNTER — Telehealth: Payer: Self-pay | Admitting: Internal Medicine

## 2017-01-11 DIAGNOSIS — Z6838 Body mass index (BMI) 38.0-38.9, adult: Secondary | ICD-10-CM | POA: Diagnosis not present

## 2017-01-11 DIAGNOSIS — J449 Chronic obstructive pulmonary disease, unspecified: Secondary | ICD-10-CM | POA: Diagnosis not present

## 2017-01-11 DIAGNOSIS — Z299 Encounter for prophylactic measures, unspecified: Secondary | ICD-10-CM | POA: Diagnosis not present

## 2017-01-11 DIAGNOSIS — E7800 Pure hypercholesterolemia, unspecified: Secondary | ICD-10-CM | POA: Diagnosis not present

## 2017-01-11 DIAGNOSIS — I4891 Unspecified atrial fibrillation: Secondary | ICD-10-CM | POA: Diagnosis not present

## 2017-01-11 NOTE — Telephone Encounter (Signed)
Vials: 1 Order Date: 01/11/17 Ship Date: 01/11/17

## 2017-01-12 NOTE — Telephone Encounter (Signed)
Arrival Date: 01/12/17 # of Vials: 1 Lot #: [id number] Expiration Date: 05/2019

## 2017-01-18 ENCOUNTER — Encounter: Payer: Self-pay | Admitting: Internal Medicine

## 2017-01-18 ENCOUNTER — Ambulatory Visit: Payer: Medicare Other

## 2017-01-18 ENCOUNTER — Ambulatory Visit (INDEPENDENT_AMBULATORY_CARE_PROVIDER_SITE_OTHER): Payer: Medicare Other | Admitting: Internal Medicine

## 2017-01-18 VITALS — BP 124/78 | HR 52 | Ht 63.5 in | Wt 204.4 lb

## 2017-01-18 DIAGNOSIS — G4733 Obstructive sleep apnea (adult) (pediatric): Secondary | ICD-10-CM | POA: Diagnosis not present

## 2017-01-18 DIAGNOSIS — J302 Other seasonal allergic rhinitis: Secondary | ICD-10-CM | POA: Diagnosis not present

## 2017-01-18 DIAGNOSIS — I480 Paroxysmal atrial fibrillation: Secondary | ICD-10-CM | POA: Diagnosis not present

## 2017-01-18 DIAGNOSIS — J4520 Mild intermittent asthma, uncomplicated: Secondary | ICD-10-CM

## 2017-01-18 DIAGNOSIS — J3089 Other allergic rhinitis: Secondary | ICD-10-CM | POA: Diagnosis not present

## 2017-01-18 NOTE — Assessment & Plan Note (Signed)
She continues working closely with cardiology. Rhythm feels like sinus at this visit

## 2017-01-18 NOTE — Assessment & Plan Note (Signed)
Nonspecific increased nasal symptoms this week coincidence with change in temperature and humidity. Plan-try saline nasal rinse as discussed.

## 2017-01-18 NOTE — Patient Instructions (Signed)
We can continue CPAP 9, mask of choice, humidifier, supplies, AirView     We can continue Nucala  You can call [facility] and see when you can bring your machine and hose and mask to show them the discoloration. It might rinse out with a little white vinegar rinse.  Try otc nasal saline rinse to see if it will help the stuffy nose discomfort now.   Please call if we can help

## 2017-01-18 NOTE — Assessment & Plan Note (Signed)
Started Nucala therapy. Hopefully this will help stabilize reactive airways and maybe also reduce rhinitis symptoms

## 2017-01-18 NOTE — Progress Notes (Signed)
HPI female never smoker followed for OSA, allergic rhinitis, asthma, complicated by A. Fib/Coumadin, HBP, GERD, obesity NPSG prior to EMR in 2012 Office Spirometry 09/02/16-WNL. FVC 2.06/76%, FEV1 1.57/77%, ratio 0.76, FEF 25-75% 1.26/80% Allergy labs 09/02/16- eosinophils 200, total IgE 4230 causing elevation of all specific allergen antibody levels including Aspergillus. PFT-10/13/16-minimal obstruction without response to dilator, minimal reduction of diffusion. FVC 2.02/77%, FEV1 1.62/83%, ratio 0.80, TLC 92%, DLCO 77% CT soft tissue neck 09/22/16-1. 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 Subsegmental atelectasis and nonspecific linear patchy densities in the lungs as described. They have a benign appearance.  Small hyperdense masses in the mediastinum are stable compared with 2015 supporting benign etiology such as ectopic thyroid tissue. Three-vessel prominent coronary artery calcification. Bibasilar bronchiolectasis. ------------------------------------------------------------------------------------------- 10/16/16- 76 year old female never smoker followed for OSA, allergic rhinitis, complicated by A. Fib/Coumadin, HBP, GERD, obesity CPAP 9/[facility] FOLLOWS FOR: Review PFT, CT Chest and CT Neck results with patient. Pt is set for [place] next week. Prednisone 10 mg QOD associated with significant improvement in cough. Continues and/or oh, Singulair, Xopenex by nebulizer and rescue inhaler, budesonide by nebulizer. She has felt significantly better with much less cough since starting prednisone 10 mg every other day. She does cough if out in hot dry air. Pending cardioversion next week. Triggers for her A. fib include getting overheated, getting tired, eating too much. She remains comfortable with CPAP used every night and she sleeps better with it. If in A. fib she sleeps better upright in recliner without CPAP. Allergy  labs 09/02/16- eosinophils 200, total IgE 4230 causing elevation of all specific allergen antibody levels including Aspergillus. PFT-10/13/16-minimal obstruction without response to dilator, minimal reduction of diffusion. FVC 2.02/77%, FEV1 1.62/83%, ratio 0.80, TLC 92%, DLCO 77% CT soft tissue neck 09/22/16 IMPRESSION: 1. 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 No laryngeal or pharyngeal mass or abnormality. 2. 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 CT chest 09/02/16  IMPRESSION: No active cardiopulmonary disease. Subsegmental atelectasis and nonspecific linear patchy densities in the lungs as described. They have a benign appearance. No definite lung nodule or lung mass. Small hyperdense masses in the mediastinum are stable compared with 2015 supporting benign etiology such as ectopic thyroid tissue. There is dilatation of the left atrium and left ventricle. Left heart dysfunction is not excluded. Correlate clinically as for the need for echocardiography. Three-vessel prominent coronary artery calcification. Bibasilar bronchiolectasis. Aortic Atherosclerosis (ICD10-I70.0).  01/18/17- 76 year old female never smoker followed for OSA, allergic rhinitis, asthma complicated by A. Fib/Coumadin, HBP, GERD, obesity CPAP 9/[facility] -OSA; DME: [facility]. Pt wears CPAP nightly and DL attached. No new supplies needed at this time, Pt is concerned about having fungus in her CPAP machine-states she has used [employer] for aobut 1 year now and recently had orange like substance on her hoses,etc. Pt now has drainage in throat and congestion as well.-   neb xopenex 0.63/budesonide, prednisone 10 mg every other day, Anoro Followed by cardiology for intermittent A. fib/A flutter She understands her paroxysmal A. fib impacts her  dyspnea on exertion and sense of well-being. CPAP download documents 100% compliance, averaging 6-1/2 hours, AHI 2.3/hour. She is very compliant, recognizing it helps her heart. Recent minor sore throat and stuffy nose without fever. Better today. Started Nucala injections   ROS-see HPI   + = pos Constitutional:  No-   weight loss, night sweats, fevers, chills, fatigue, lassitude. HEENT:   No-  headaches, difficulty swallowing, tooth/dental problems, sore throat,       No-  sneezing, itching,  +ear ache,  +nasal congestion, +post nasal drip,  CV:  No-   chest pain, orthopnea, PND, swelling in lower extremities, anasarca, dizziness, palpitations Resp: +  shortness of breath with exertion or at rest.                productive cough,  No non-productive cough,  No- coughing up of blood.              No-change in color of mucus.  No- wheezing.   Skin: No-   rash or lesions. GI:  No-   heartburn, indigestion, abdominal pain, nausea, vomiting, GU:  MS:  No-   joint pain or swelling. . Neuro-     nothing unusual Psych:  No- change in mood or affect. No depression or anxiety.  No memory loss.   OBJ- Physical Exam General- Alert, Oriented, Affect-appropriate, Distress- none acute, + overweight Skin- rash-none, lesions- none, excoriation- none Lymphadenopathy- none Head- atraumatic            Eyes- Gross vision intact, PERRLA, conjunctivae and secretions clear            Ears- Hearing, canals-normal            Nose- +turbinate edema, no-Septal dev, mucus, polyps, erosion, perforation             Throat- Mallampati III-IV , mucosa clear, not red , drainage- none, tonsils- atrophic Neck- flexible , trachea midline, no stridor , thyroid nl, carotid no bruit Chest - symmetrical excursion , unlabored           Heart/CV- +IRR (no pacemaker) , no murmur , no gallop  , no rub, nl  s1 s2                  - JVD- none , edema- none, stasis changes- none, varices- none           Lung- , wheeze-none,  winded conversation, cough-none                        dullness-none, rub- none           Chest wall-  Abd- Br/ Gen/ Rectal- Not done, not indicated Extrem- cyanosis- none, clubbing, none, atrophy- none, strength- nl Neuro- grossly intact to observation

## 2017-01-18 NOTE — Assessment & Plan Note (Signed)
She describes good compliance and sleeps better with CPAP, confirmed by download result.

## 2017-01-19 ENCOUNTER — Ambulatory Visit: Payer: Medicare Other

## 2017-01-19 MED ORDER — MEPOLIZUMAB 100 MG ~~LOC~~ SOLR
100.0000 mg | SUBCUTANEOUS | Status: DC
  Administered 2017-01-18: 100 mg via SUBCUTANEOUS

## 2017-01-19 NOTE — Addendum Note (Signed)
Addended by: Len Blalock on: 01/19/2017 09:37 AM   Modules accepted: Orders

## 2017-01-20 ENCOUNTER — Telehealth: Payer: Self-pay | Admitting: Internal Medicine

## 2017-01-20 NOTE — Telephone Encounter (Signed)
Spoke with patient. She stated that she had an injection of Nucala on Monday. Before the injection, she felt fine. After the injection, she noticed that she has been coughing more and her stomach is now upset. She wanted to know if these were common side effects of the medication and if she needed to stop the injections.   CY, please advise. Thanks!

## 2017-01-20 NOTE — Telephone Encounter (Signed)
I can't promise, but this isn't a complaint we usually get after Nucala. It may be some stomach bug. It should clear quickly. My suggestion would be to take next Nucal injection on schedule. If it happens again of course, let us know.

## 2017-01-20 NOTE — Telephone Encounter (Signed)
Spoke with patient. She stated that she figured it was a stomach virus but she just wanted to be sure. She will proceed with the next injection and will let us know if any more side effects.

## 2017-01-21 DIAGNOSIS — J449 Chronic obstructive pulmonary disease, unspecified: Secondary | ICD-10-CM | POA: Diagnosis not present

## 2017-01-21 DIAGNOSIS — I10 Essential (primary) hypertension: Secondary | ICD-10-CM | POA: Diagnosis not present

## 2017-01-21 DIAGNOSIS — Z299 Encounter for prophylactic measures, unspecified: Secondary | ICD-10-CM | POA: Diagnosis not present

## 2017-01-21 DIAGNOSIS — Z6837 Body mass index (BMI) 37.0-37.9, adult: Secondary | ICD-10-CM | POA: Diagnosis not present

## 2017-01-21 DIAGNOSIS — I4891 Unspecified atrial fibrillation: Secondary | ICD-10-CM | POA: Diagnosis not present

## 2017-01-22 DIAGNOSIS — I10 Essential (primary) hypertension: Secondary | ICD-10-CM | POA: Diagnosis not present

## 2017-01-22 DIAGNOSIS — Z880 Allergy status to penicillin: Secondary | ICD-10-CM | POA: Diagnosis not present

## 2017-01-22 DIAGNOSIS — Z7951 Long term (current) use of inhaled steroids: Secondary | ICD-10-CM | POA: Diagnosis not present

## 2017-01-22 DIAGNOSIS — R0602 Shortness of breath: Secondary | ICD-10-CM | POA: Diagnosis not present

## 2017-01-22 DIAGNOSIS — I480 Paroxysmal atrial fibrillation: Secondary | ICD-10-CM | POA: Diagnosis not present

## 2017-01-22 DIAGNOSIS — J209 Acute bronchitis, unspecified: Secondary | ICD-10-CM | POA: Diagnosis not present

## 2017-01-22 DIAGNOSIS — F419 Anxiety disorder, unspecified: Secondary | ICD-10-CM | POA: Diagnosis not present

## 2017-01-22 DIAGNOSIS — F329 Major depressive disorder, single episode, unspecified: Secondary | ICD-10-CM | POA: Diagnosis not present

## 2017-01-22 DIAGNOSIS — J309 Allergic rhinitis, unspecified: Secondary | ICD-10-CM | POA: Diagnosis not present

## 2017-01-22 DIAGNOSIS — Z79899 Other long term (current) drug therapy: Secondary | ICD-10-CM | POA: Diagnosis not present

## 2017-01-22 DIAGNOSIS — I495 Sick sinus syndrome: Secondary | ICD-10-CM | POA: Diagnosis not present

## 2017-01-22 DIAGNOSIS — G4733 Obstructive sleep apnea (adult) (pediatric): Secondary | ICD-10-CM | POA: Diagnosis not present

## 2017-01-22 DIAGNOSIS — Z809 Family history of malignant neoplasm, unspecified: Secondary | ICD-10-CM | POA: Diagnosis not present

## 2017-01-22 DIAGNOSIS — J441 Chronic obstructive pulmonary disease with (acute) exacerbation: Secondary | ICD-10-CM | POA: Diagnosis not present

## 2017-01-22 DIAGNOSIS — J440 Chronic obstructive pulmonary disease with acute lower respiratory infection: Secondary | ICD-10-CM | POA: Diagnosis not present

## 2017-01-22 DIAGNOSIS — Z8249 Family history of ischemic heart disease and other diseases of the circulatory system: Secondary | ICD-10-CM | POA: Diagnosis not present

## 2017-01-22 DIAGNOSIS — Z981 Arthrodesis status: Secondary | ICD-10-CM | POA: Diagnosis not present

## 2017-01-22 DIAGNOSIS — E785 Hyperlipidemia, unspecified: Secondary | ICD-10-CM | POA: Diagnosis not present

## 2017-01-22 DIAGNOSIS — Z7901 Long term (current) use of anticoagulants: Secondary | ICD-10-CM | POA: Diagnosis not present

## 2017-01-22 DIAGNOSIS — Z888 Allergy status to other drugs, medicaments and biological substances status: Secondary | ICD-10-CM | POA: Diagnosis not present

## 2017-01-23 DIAGNOSIS — J309 Allergic rhinitis, unspecified: Secondary | ICD-10-CM | POA: Diagnosis not present

## 2017-01-23 DIAGNOSIS — Z7901 Long term (current) use of anticoagulants: Secondary | ICD-10-CM | POA: Diagnosis not present

## 2017-01-23 DIAGNOSIS — J209 Acute bronchitis, unspecified: Secondary | ICD-10-CM | POA: Diagnosis not present

## 2017-01-23 DIAGNOSIS — J441 Chronic obstructive pulmonary disease with (acute) exacerbation: Secondary | ICD-10-CM | POA: Diagnosis not present

## 2017-01-23 DIAGNOSIS — R0602 Shortness of breath: Secondary | ICD-10-CM | POA: Diagnosis not present

## 2017-01-23 DIAGNOSIS — J440 Chronic obstructive pulmonary disease with acute lower respiratory infection: Secondary | ICD-10-CM | POA: Diagnosis not present

## 2017-01-23 DIAGNOSIS — I480 Paroxysmal atrial fibrillation: Secondary | ICD-10-CM | POA: Diagnosis not present

## 2017-01-24 DIAGNOSIS — Z7901 Long term (current) use of anticoagulants: Secondary | ICD-10-CM | POA: Diagnosis not present

## 2017-01-24 DIAGNOSIS — I480 Paroxysmal atrial fibrillation: Secondary | ICD-10-CM | POA: Diagnosis not present

## 2017-01-24 DIAGNOSIS — J440 Chronic obstructive pulmonary disease with acute lower respiratory infection: Secondary | ICD-10-CM | POA: Diagnosis not present

## 2017-01-24 DIAGNOSIS — J441 Chronic obstructive pulmonary disease with (acute) exacerbation: Secondary | ICD-10-CM | POA: Diagnosis not present

## 2017-01-24 DIAGNOSIS — J209 Acute bronchitis, unspecified: Secondary | ICD-10-CM | POA: Diagnosis not present

## 2017-01-24 DIAGNOSIS — J309 Allergic rhinitis, unspecified: Secondary | ICD-10-CM | POA: Diagnosis not present

## 2017-02-01 ENCOUNTER — Telehealth: Payer: Self-pay | Admitting: Internal Medicine

## 2017-02-01 DIAGNOSIS — I4891 Unspecified atrial fibrillation: Secondary | ICD-10-CM | POA: Diagnosis not present

## 2017-02-01 DIAGNOSIS — L299 Pruritus, unspecified: Secondary | ICD-10-CM | POA: Diagnosis not present

## 2017-02-01 DIAGNOSIS — Z6837 Body mass index (BMI) 37.0-37.9, adult: Secondary | ICD-10-CM | POA: Diagnosis not present

## 2017-02-01 DIAGNOSIS — M87059 Idiopathic aseptic necrosis of unspecified femur: Secondary | ICD-10-CM | POA: Diagnosis not present

## 2017-02-01 DIAGNOSIS — Z299 Encounter for prophylactic measures, unspecified: Secondary | ICD-10-CM | POA: Diagnosis not present

## 2017-02-01 DIAGNOSIS — J449 Chronic obstructive pulmonary disease, unspecified: Secondary | ICD-10-CM | POA: Diagnosis not present

## 2017-02-01 NOTE — Telephone Encounter (Signed)
Vials: 1 Order Date: 02/01/17 Ship Date: 02/01/17

## 2017-02-02 NOTE — Telephone Encounter (Signed)
Arrival Date: 02/02/17 # of Vials: 1 Lot #: [id number] Expiration Date: 05/2019

## 2017-02-08 ENCOUNTER — Other Ambulatory Visit (HOSPITAL_COMMUNITY): Payer: Self-pay | Admitting: Internal Medicine

## 2017-02-08 DIAGNOSIS — Z1231 Encounter for screening mammogram for malignant neoplasm of breast: Secondary | ICD-10-CM

## 2017-02-10 ENCOUNTER — Ambulatory Visit (HOSPITAL_COMMUNITY)
Admission: RE | Admit: 2017-02-10 | Discharge: 2017-02-10 | Disposition: A | Discharge status: Home / Self Care | Payer: Medicare Other | Source: Ambulatory Visit | Attending: Nurse Practitioner | Admitting: Nurse Practitioner

## 2017-02-10 ENCOUNTER — Encounter (HOSPITAL_COMMUNITY): Payer: Self-pay | Admitting: Nurse Practitioner

## 2017-02-10 ENCOUNTER — Other Ambulatory Visit (HOSPITAL_COMMUNITY): Payer: Self-pay | Admitting: *Deleted

## 2017-02-10 VITALS — BP 118/68 | HR 94 | Ht 63.5 in | Wt 202.0 lb

## 2017-02-10 DIAGNOSIS — Z6835 Body mass index (BMI) 35.0-35.9, adult: Secondary | ICD-10-CM | POA: Insufficient documentation

## 2017-02-10 DIAGNOSIS — E669 Obesity, unspecified: Secondary | ICD-10-CM | POA: Insufficient documentation

## 2017-02-10 DIAGNOSIS — Z888 Allergy status to other drugs, medicaments and biological substances status: Secondary | ICD-10-CM | POA: Diagnosis not present

## 2017-02-10 DIAGNOSIS — I4819 Other persistent atrial fibrillation: Secondary | ICD-10-CM

## 2017-02-10 DIAGNOSIS — Z7901 Long term (current) use of anticoagulants: Secondary | ICD-10-CM | POA: Diagnosis not present

## 2017-02-10 DIAGNOSIS — J449 Chronic obstructive pulmonary disease, unspecified: Secondary | ICD-10-CM | POA: Diagnosis not present

## 2017-02-10 DIAGNOSIS — Z880 Allergy status to penicillin: Secondary | ICD-10-CM | POA: Insufficient documentation

## 2017-02-10 DIAGNOSIS — Z79899 Other long term (current) drug therapy: Secondary | ICD-10-CM | POA: Diagnosis not present

## 2017-02-10 DIAGNOSIS — Z96641 Presence of right artificial hip joint: Secondary | ICD-10-CM | POA: Insufficient documentation

## 2017-02-10 DIAGNOSIS — Z9889 Other specified postprocedural states: Secondary | ICD-10-CM | POA: Diagnosis not present

## 2017-02-10 DIAGNOSIS — I10 Essential (primary) hypertension: Secondary | ICD-10-CM | POA: Diagnosis not present

## 2017-02-10 DIAGNOSIS — I481 Persistent atrial fibrillation: Secondary | ICD-10-CM

## 2017-02-10 DIAGNOSIS — B309 Viral conjunctivitis, unspecified: Secondary | ICD-10-CM | POA: Diagnosis not present

## 2017-02-10 DIAGNOSIS — I4892 Unspecified atrial flutter: Secondary | ICD-10-CM | POA: Insufficient documentation

## 2017-02-10 LAB — BASIC METABOLIC PANEL
ANION GAP: 9 (ref 5–15)
BUN: 16 mg/dL (ref 6–20)
CO2: 23 mmol/L (ref 22–32)
Calcium: 9 mg/dL (ref 8.9–10.3)
Chloride: 105 mmol/L (ref 101–111)
Creatinine, Ser: 1.06 mg/dL — ABNORMAL HIGH (ref 0.44–1.00)
GFR, EST AFRICAN AMERICAN: 57 mL/min — AB (ref >60)
GFR, EST NON AFRICAN AMERICAN: 49 mL/min — AB (ref >60)
GLUCOSE: 123 mg/dL — AB (ref 65–99)
POTASSIUM: 3.2 mmol/L — AB (ref 3.5–5.1)
Sodium: 137 mmol/L (ref 135–145)

## 2017-02-10 LAB — CBC
HCT: 39.7 % (ref 36.0–46.0)
Hemoglobin: 12.7 g/dL (ref 12.0–15.0)
MCH: 27.1 pg (ref 26.0–34.0)
MCHC: 32 g/dL (ref 30.0–36.0)
MCV: 84.8 fL (ref 78.0–100.0)
PLATELETS: 172 10*3/uL (ref 150–400)
RBC: 4.68 MIL/uL (ref 3.87–5.11)
RDW: 15.4 % (ref 11.5–15.5)
WBC: 11.2 10*3/uL — AB (ref 4.0–10.5)

## 2017-02-10 LAB — PROTIME-INR
INR: 2.3
PROTHROMBIN TIME: 25.1 s — AB (ref 11.4–15.2)

## 2017-02-10 MED ORDER — POTASSIUM CHLORIDE CRYS ER 20 MEQ PO TBCR: 20.0000 meq | EXTENDED_RELEASE_TABLET | Freq: Every day | ORAL | 6 refills | Status: DC

## 2017-02-10 MED ORDER — DILTIAZEM HCL ER COATED BEADS 120 MG PO CP24: 120.0000 mg | ORAL_CAPSULE | Freq: Two times a day (BID) | ORAL | 3 refills | Status: DC

## 2017-02-10 NOTE — Progress Notes (Signed)
Primary Care Physician: [name] Blitz, MD Referring Physician: Dr. [name] is a 76 y.o. female with a h/o paroxysmal afib.She has h/o ablation 08/2014. Flecainide was decreased to 50 mg bid from 100 mg bid after ablation due to doing well and later stopped. She did have to go back on flecainide 174m bid at a later date for recurrent afib. She is in the afib clinic for paroxysmal afib since last  Monday. She had a cardioversion 7/17, at which time she felt like constipation had precipitated. This was successful . She struggles with her weight. She has recently became constipated again, returned to afib, and saw PCP this am for this and got mag citrate.   No alcohol or caffeine, uses CPAP.  She was taken off 120 mg Cardizem daily several months ago due to being bradycardic and takes several 30 mg tabs a day now. Continues on flecainde and warfarin. Feels if the constipation straightens out, SR will return.  She was set up for cardioversion on last visit, but did convert after increse in cardizem.  Seen in fib clinic for urgent visit, 11/14, has been in afib since last Monday. She was recently hospitalized for asthma/ lung issues and recently finished prednisone.  She has been taking prn Cardizem, without improvement. She is wanting another cardioversion. We discussed another approach to afib management as she has had many afib epioseds over the lst few months.. She does not think she can afford tikosyn, sotalol may aggravate lungs. She wants to consider another ablation. She was also seen at the eye doctor this am and has pink eye. She is on warfarin and does not want to wait the 4 weeks for therapeutic INR's, therefore will proceed with TEE cardioversion.  Today, she denies symptoms of  chest pain,  orthopnea, PND, lower extremity edema, dizziness, presyncope, syncope, or neurologic sequela.  Positive for shortness of breath, fatigue. The patient is tolerating medications  without difficulties and is otherwise without complaint today.   Past Medical History:  Diagnosis Date  . Allergic rhinitis   . Anxiety   . Atrial flutter ([place]   . COPD (chronic obstructive pulmonary disease) ([place]   . Depression   . Essential hypertension   . Hyperlipidemia   . Lumbar disc disease   . Obesity   . OSA (obstructive sleep apnea)    CPAP  . Paroxysmal atrial fibrillation (HCC)    Element of tachycardia bradycardia syndrome  . Respiratory failure ([facility]    Past Surgical History:  Procedure Laterality Date  . ANTERIOR FUSION LUMBAR SPINE    . CATARACT EXTRACTION    . LASIK     Both eyes  . TOTAL HIP ARTHROPLASTY  2010   Right    Current Outpatient Medications  Medication Sig Dispense Refill  . acetaminophen (TYLENOL) 500 MG tablet Take 500 mg by mouth daily as needed for headache.    .Marland Kitchenantiseptic oral rinse (BIOTENE) LIQD 15 mLs by Mouth Rinse route as needed for dry mouth.    .Marland Kitchenatorvastatin (LIPITOR) 10 MG tablet Take 10 mg by mouth every evening.     . baclofen (LIORESAL) 10 MG tablet Take 10 mg by mouth daily as needed for muscle spasms.     . Biotin 1000 MCG tablet Take 1,000 mcg by mouth every morning.     . budesonide (PULMICORT) 0.5 MG/2ML nebulizer solution Take 0.5 mg by nebulization 2 (two) times daily.     . cetirizine (  ZYRTEC) 10 MG tablet Take 10 mg by mouth daily as needed for allergies.    . cycloSPORINE (RESTASIS) 0.05 % ophthalmic emulsion 1 drop 2 (two) times daily.    Marland Kitchen diltiazem (CARDIZEM CD) 120 MG 24 hr capsule Take 1 capsule (120 mg total) by mouth daily. 30 capsule 11  . diltiazem (CARDIZEM) 30 MG tablet Take 1 tablet (30 mg total) by mouth 3 (three) times daily. May take an additional 30 mg every 6 hours as needed for afib (Patient taking differently: Take 30 mg as needed by mouth. May take an additional 30 mg every 6 hours as needed for afib)    . erythromycin ethylsuccinate (EES) 400 MG tablet Take 1,600 mg by mouth as directed. 1 hour  prior to dental procedures    . famotidine (PEPCID) 20 MG tablet Take 1 tablet (20 mg total) by mouth at bedtime.    . flecainide (TAMBOCOR) 100 MG tablet Take 1 tablet (100 mg total) by mouth 2 (two) times daily. 180 tablet 2  . fluticasone (FLONASE) 50 MCG/ACT nasal spray Place 1 spray into both nostrils 2 (two) times daily.    . Hypromellose (ARTIFICIAL TEARS OP) Apply 1 drop to eye every 4 (four) hours as needed (dry eyes).    Marland Kitchen losartan-hydrochlorothiazide (HYZAAR) 100-25 MG tablet Take 1 tablet by mouth daily.  7  . montelukast (SINGULAIR) 10 MG tablet Take 10 mg by mouth at bedtime.    . Multiple Minerals-Vitamins (CALCIUM-MAGNESIUM-ZINC-D3) TABS Take 1 tablet by mouth 2 (two) times daily.    Marland Kitchen omeprazole (PRILOSEC) 40 MG capsule Take 40 mg by mouth every morning.    . ondansetron (ZOFRAN) 4 MG tablet Take 1 tablet (4 mg total) by mouth daily as needed for nausea or vomiting. 20 tablet 0  . umeclidinium-vilanterol (ANORO ELLIPTA) 62.5-25 MCG/INH AEPB Inhale 1 puff into the lungs daily.    Marland Kitchen warfarin (COUMADIN) 5 MG tablet Take 1 tablet (5 mg total) by mouth daily. (Patient taking differently: Take 2.5-5 mg by mouth every evening. Take 2.5 mg daily on Mon, Wed, and Fri, then take 62ms daily on Tues, Thur, Sat, and Sun) 45 tablet 3   Current Facility-Administered Medications  Medication Dose Route Frequency Provider Last Rate Last Dose  . levalbuterol (XOPENEX) nebulizer solution 0.63 mg  0.63 mg Nebulization Q6H YBaird LyonsD, MD   0.63 mg at 09/02/16 1027  . Mepolizumab SOLR 100 mg  100 mg Subcutaneous Q28 days YBaird LyonsD, MD   100 mg at 12/22/16 1059  . Mepolizumab SOLR 100 mg  100 mg Subcutaneous Q28 days YBaird LyonsD, MD   100 mg at 01/18/17 07939   Allergies  Allergen Reactions  . Bupropion Hcl Other (See Comments)    Suicidal Thoughts.   . Escitalopram Oxalate Other (See Comments)    Suicidal Thoughts.   . Lisinopril     cough  . Serevent Other (See Comments)     Caused patient to go into Afib  . Macrodantin [Nitrofurantoin] Rash  . Penicillins Rash    Childhood allergy  Has patient had a PCN reaction causing immediate rash, facial/tongue/throat swelling, SOB or lightheadedness with hypotension: Unknown Has patient had a PCN reaction causing severe rash involving mucus membranes or skin necrosis: Unknown Has patient had a PCN reaction that required hospitalization No Has patient had a PCN reaction occurring within the last 10 years: No If all of the above answers are "NO", then may proceed with Cephalosporin use.  Social History   Socioeconomic History  . Marital status: Single    Spouse name: Not on file  . Number of children: Not on file  . Years of education: Not on file  . Highest education level: Not on file  Social Needs  . Financial resource strain: Not on file  . Food insecurity - worry: Not on file  . Food insecurity - inability: Not on file  . Transportation needs - medical: Not on file  . Transportation needs - non-medical: Not on file  Occupational History  . Occupation: Retired: Presenter, broadcasting  Tobacco Use  . Smoking status: Never Smoker  . Smokeless tobacco: Never Used  Substance and Sexual Activity  . Alcohol use: No    Alcohol/week: 0.0 oz  . Drug use: No  . Sexual activity: Not on file  Other Topics Concern  . Not on file  Social History Narrative   Pt lives in [place] alone. She was never married.   Retired Customer service manager.   Attends [employer]    Family History  Problem Relation Age of Onset  . Cancer Mother   . Heart attack Father   . Colon cancer Other     ROS- All systems are reviewed and negative except as per the HPI above  Physical Exam: Vitals:   02/10/17 1107  BP: 118/68  Pulse: 94  Weight: 202 lb (91.6 kg)  Height: 5' 3.5" (1.613 m)    GEN- The patient is well appearing, alert and oriented x 3 today.   Head- normocephalic, atraumatic Eyes-  Sclera clear, conjunctiva  pink Ears- hearing intact Oropharynx- clear Neck- supple, no JVP Lymph- no cervical lymphadenopathy Lungs- Clear to ausculation bilaterally, normal work of breathing Heart-  irregular rate and rhythm, no murmurs, rubs or gallops, PMI not laterally displaced GI- soft, NT, ND, + BS Extremities- no clubbing, cyanosis, or edema MS- no significant deformity or atrophy Skin- no rash or lesion Psych- euthymic mood, full affect Neuro- strength and sensation are intact  EKG-  afib at 94 bpm, with IRBBB,  qrs int 104 ms, rate at 495 Epic records reviewed   Assessment and Plan: 1. Persistent aflutter Increase Cardizem 120 mg bid Continue flecainide 100 mg bid Continue warfarin 5 mg qd for chadsvasc score of 4 TEE cardioversion as pt does not want to wait 4 weeks for the therapeutic INR's  2. Lifestyles issues Weight loss encouraged Continue gym classes Continue CPAP  3. HTN Stable   F/u with Dr. Rayann Heman in 2-3 weeks   Geroge Baseman. [name] [name], [facility] [address] [place], [place] [address] [phone]

## 2017-02-10 NOTE — Patient Instructions (Addendum)
Your physician has recommended you make the following change in your medication:  1)Increase cardizem 120mg  to twice a day  Scheduling will be in touch to move your appointment up with Dr. Rayann Heman to early December.  Cardioversion scheduled for Tuesday, November 25th  - Arrive at the [facility] and go to admitting at 11:30AM  -Do not eat or drink anything after midnight the night prior to your procedure.  - Take all your medication with a sip of water prior to arrival.  - You will not be able to drive home after your procedure.

## 2017-02-11 ENCOUNTER — Ambulatory Visit (HOSPITAL_COMMUNITY): Payer: Medicare Other

## 2017-02-12 ENCOUNTER — Other Ambulatory Visit (HOSPITAL_COMMUNITY): Payer: Self-pay | Admitting: *Deleted

## 2017-02-16 ENCOUNTER — Ambulatory Visit (INDEPENDENT_AMBULATORY_CARE_PROVIDER_SITE_OTHER): Payer: Medicare Other

## 2017-02-16 DIAGNOSIS — J4520 Mild intermittent asthma, uncomplicated: Secondary | ICD-10-CM | POA: Diagnosis not present

## 2017-02-17 ENCOUNTER — Telehealth: Payer: Self-pay

## 2017-02-17 ENCOUNTER — Telehealth (HOSPITAL_COMMUNITY): Payer: Self-pay | Admitting: *Deleted

## 2017-02-17 MED ORDER — MEPOLIZUMAB 100 MG ~~LOC~~ SOLR
100.0000 mg | SUBCUTANEOUS | Status: DC
  Administered 2017-02-16: 100 mg via SUBCUTANEOUS

## 2017-02-17 NOTE — Telephone Encounter (Signed)
Patient is scheduled for a cardioversion on Tuesday 11/27. Patient states she feels like she is out of afib now and doesn't think she needs it. Patient contacted afib clinic and they stated she would need an EKG first to be sure. Patient to come Monday 11/26 for an EKG

## 2017-02-17 NOTE — Telephone Encounter (Signed)
Pt called in stating she feels like she maybe back in NSR. She would like to confirm with EKG prior to canceling cardioversion but would prefer this be done in [place] to avoid drive to [facility]. She will go on Monday to have EKG faxed to afib clinic. Will cancel if NSR.

## 2017-02-17 NOTE — Progress Notes (Signed)
Documentation of medication administration and charges of Nucala have been completed by Luana Tatro, CMA based on the Nucala documentation sheet completed by Tammy Scott.   

## 2017-02-19 DIAGNOSIS — I4891 Unspecified atrial fibrillation: Secondary | ICD-10-CM | POA: Diagnosis not present

## 2017-02-19 DIAGNOSIS — Z6837 Body mass index (BMI) 37.0-37.9, adult: Secondary | ICD-10-CM | POA: Diagnosis not present

## 2017-02-19 DIAGNOSIS — Z299 Encounter for prophylactic measures, unspecified: Secondary | ICD-10-CM | POA: Diagnosis not present

## 2017-02-19 DIAGNOSIS — I10 Essential (primary) hypertension: Secondary | ICD-10-CM | POA: Diagnosis not present

## 2017-02-19 DIAGNOSIS — Z713 Dietary counseling and surveillance: Secondary | ICD-10-CM | POA: Diagnosis not present

## 2017-02-22 ENCOUNTER — Other Ambulatory Visit (HOSPITAL_COMMUNITY): Payer: Self-pay | Admitting: *Deleted

## 2017-02-22 ENCOUNTER — Encounter (HOSPITAL_COMMUNITY): Payer: Self-pay

## 2017-02-22 ENCOUNTER — Ambulatory Visit (HOSPITAL_COMMUNITY): Payer: Medicare Other

## 2017-02-22 ENCOUNTER — Ambulatory Visit (HOSPITAL_COMMUNITY)
Admission: RE | Admit: 2017-02-22 | Discharge: 2017-02-22 | Disposition: A | Discharge status: Home / Self Care | Payer: Medicare Other | Source: Ambulatory Visit | Attending: Internal Medicine | Admitting: Internal Medicine

## 2017-02-22 ENCOUNTER — Ambulatory Visit (INDEPENDENT_AMBULATORY_CARE_PROVIDER_SITE_OTHER): Payer: Medicare Other | Admitting: *Deleted

## 2017-02-22 DIAGNOSIS — I4819 Other persistent atrial fibrillation: Secondary | ICD-10-CM

## 2017-02-22 DIAGNOSIS — M9902 Segmental and somatic dysfunction of thoracic region: Secondary | ICD-10-CM | POA: Diagnosis not present

## 2017-02-22 DIAGNOSIS — I481 Persistent atrial fibrillation: Secondary | ICD-10-CM

## 2017-02-22 DIAGNOSIS — M546 Pain in thoracic spine: Secondary | ICD-10-CM | POA: Diagnosis not present

## 2017-02-22 DIAGNOSIS — M542 Cervicalgia: Secondary | ICD-10-CM | POA: Diagnosis not present

## 2017-02-22 DIAGNOSIS — Z1231 Encounter for screening mammogram for malignant neoplasm of breast: Secondary | ICD-10-CM

## 2017-02-22 DIAGNOSIS — M9901 Segmental and somatic dysfunction of cervical region: Secondary | ICD-10-CM | POA: Diagnosis not present

## 2017-02-22 DIAGNOSIS — M9903 Segmental and somatic dysfunction of lumbar region: Secondary | ICD-10-CM | POA: Diagnosis not present

## 2017-02-22 DIAGNOSIS — M545 Low back pain: Secondary | ICD-10-CM | POA: Diagnosis not present

## 2017-02-22 NOTE — Progress Notes (Signed)
Pt scheduled for cardioversion tomorrow and here for repeat EKG (doesn't think she needs procedure if out of afib) denies any symptoms today. EKG done and will forward to Doristine Devoid, NP

## 2017-02-23 ENCOUNTER — Ambulatory Visit (HOSPITAL_COMMUNITY): Admission: RE | Admit: 2017-02-23 | Payer: Medicare Other | Source: Ambulatory Visit | Admitting: Cardiology

## 2017-02-23 ENCOUNTER — Encounter (HOSPITAL_COMMUNITY): Admission: RE | Payer: Self-pay | Source: Ambulatory Visit

## 2017-02-23 SURGERY — ECHOCARDIOGRAM, TRANSESOPHAGEAL
Anesthesia: Monitor Anesthesia Care

## 2017-03-05 ENCOUNTER — Ambulatory Visit (INDEPENDENT_AMBULATORY_CARE_PROVIDER_SITE_OTHER): Payer: Medicare Other | Admitting: Internal Medicine

## 2017-03-05 ENCOUNTER — Encounter: Payer: Self-pay | Admitting: Internal Medicine

## 2017-03-05 ENCOUNTER — Telehealth: Payer: Self-pay | Admitting: Internal Medicine

## 2017-03-05 VITALS — BP 140/64 | HR 50 | Ht 63.5 in | Wt 205.0 lb

## 2017-03-05 DIAGNOSIS — I4819 Other persistent atrial fibrillation: Secondary | ICD-10-CM

## 2017-03-05 DIAGNOSIS — G4733 Obstructive sleep apnea (adult) (pediatric): Secondary | ICD-10-CM | POA: Diagnosis not present

## 2017-03-05 DIAGNOSIS — I481 Persistent atrial fibrillation: Secondary | ICD-10-CM | POA: Diagnosis not present

## 2017-03-05 DIAGNOSIS — I10 Essential (primary) hypertension: Secondary | ICD-10-CM

## 2017-03-05 MED ORDER — DILTIAZEM HCL ER COATED BEADS 120 MG PO CP24: 120.0000 mg | ORAL_CAPSULE | Freq: Every day | ORAL | Status: DC

## 2017-03-05 NOTE — Patient Instructions (Signed)
Medication Instructions:   Decrease Diltiazem CD to 120mg  DAILY.  Continue all other medications.    Labwork:  BMET - order given today.  Office will contact with results via phone or letter.    Testing/Procedures: none  Follow-Up: Your physician wants you to follow up in:  1 year.  You will receive a reminder letter in the mail one-two months in advance.  If you don't receive a letter, please call our office to schedule the follow up appointment   Any Other Special Instructions Will Be Listed Below (If Applicable).  If you need a refill on your cardiac medications before your next appointment, please call your pharmacy.

## 2017-03-05 NOTE — Telephone Encounter (Signed)
Vials: 1 Order Date: 03/05/17 Ship Date: 03/08/17

## 2017-03-05 NOTE — Progress Notes (Signed)
PCP: Monico Blitz, MD Primary Cardiologist: Dr Domenic Polite Primary EP: Dr Rayann Heman  [name] [name] is a 76 y.o. female who presents today for routine electrophysiology followup.  Since last being seen in our clinic, the patient reports doing reasonably well. She has occasional afib.  She required cardioversion back in the summer.  Currently, she feels that her afib is better controlled.  Today, she denies symptoms of palpitations, chest pain, shortness of breath,  lower extremity edema, dizziness, presyncope, or syncope.  The patient is otherwise without complaint today.   Past Medical History:  Diagnosis Date  . Allergic rhinitis   . Anxiety   . Atrial flutter ([place])   . COPD (chronic obstructive pulmonary disease) ([place])   . Depression   . Essential hypertension   . Hyperlipidemia   . Lumbar disc disease   . Obesity   . OSA (obstructive sleep apnea)    CPAP  . Paroxysmal atrial fibrillation (HCC)    Element of tachycardia bradycardia syndrome  . Respiratory failure [facility])    Past Surgical History:  Procedure Laterality Date  . ANTERIOR FUSION LUMBAR SPINE    . BIOPSY  08/27/2016   Procedure: BIOPSY;  Surgeon: Rogene Houston, MD;  Location: [facility] ENDO SUITE;  Service: Endoscopy;;  FOUR GASTRIC POLYPS BIOPSIED  . CARDIOVERSION N/A 03/28/2014   Procedure: CARDIOVERSION;  Surgeon: Fay Records, MD;  Location: [facility] ORS;  Service: Cardiovascular;  Laterality: N/A;  . CARDIOVERSION N/A 10/22/2016   Procedure: CARDIOVERSION;  Surgeon: Sueanne Margarita, MD;  Location: [facility] ENDOSCOPY;  Service: Cardiovascular;  Laterality: N/A;  . CATARACT EXTRACTION    . COLONOSCOPY N/A 08/04/2012   Procedure: COLONOSCOPY;  Surgeon: Rogene Houston, MD;  Location: [facility] ENDO SUITE;  Service: Endoscopy;  Laterality: N/A;  730-rescheduled to [place] notified pt  . ELECTROPHYSIOLOGIC STUDY N/A 09/18/2014   Procedure: Atrial Fibrillation Ablation;  Surgeon: Thompson Grayer, MD;  Location: [place] CV LAB;  Service:  Cardiovascular;  Laterality: N/A;  . ESOPHAGOGASTRODUODENOSCOPY N/A 08/27/2016   Procedure: ESOPHAGOGASTRODUODENOSCOPY (EGD);  Surgeon: Rogene Houston, MD;  Location: [facility] ENDO [facility];  Service: Endoscopy;  Laterality: N/A;  1200  . LASIK     Both eyes  . TEE WITHOUT CARDIOVERSION N/A 09/18/2014   Procedure: TRANSESOPHAGEAL ECHOCARDIOGRAM (TEE);  Surgeon: Larey Dresser, MD;  Location: [place];  Service: Cardiovascular;  Laterality: N/A;  . TEE WITHOUT CARDIOVERSION N/A 10/22/2016   Procedure: TRANSESOPHAGEAL ECHOCARDIOGRAM (TEE);  Surgeon: Sueanne Margarita, MD;  Location: [facility] ENDOSCOPY;  Service: Cardiovascular;  Laterality: N/A;  . TOTAL HIP ARTHROPLASTY  2010   Right    ROS- all systems are reviewed and negatives except as per HPI above  Current Outpatient Medications  Medication Sig Dispense Refill  . acetaminophen (TYLENOL) 500 MG tablet Take 500 mg by mouth daily as needed for headache.    Marland Kitchen antiseptic oral rinse (BIOTENE) LIQD 15 mLs by Mouth Rinse route as needed for dry mouth.    Marland Kitchen atorvastatin (LIPITOR) 10 MG tablet Take 10 mg by mouth every evening.     . Biotin 1000 MCG tablet Take 1,000 mcg by mouth every morning.     . budesonide (PULMICORT) 0.5 MG/2ML nebulizer solution Take 0.5 mg by nebulization 2 (two) times daily.     . Calcium-Magnesium-Zinc (CAL-MAG-ZINC PO) Take 1 tablet 2 (two) times daily by mouth.    . cetirizine (ZYRTEC) 10 MG tablet Take 10 mg by mouth daily as needed for allergies.    Marland Kitchen  cycloSPORINE (RESTASIS) 0.05 % ophthalmic emulsion Place 1 drop 2 (two) times daily into both eyes.     Marland Kitchen diltiazem (CARDIZEM CD) 120 MG 24 hr capsule Take 1 capsule (120 mg total) 2 (two) times daily by mouth. 60 capsule 3  . diltiazem (CARDIZEM) 30 MG tablet Take 1 tablet (30 mg total) by mouth 3 (three) times daily. May take an additional 30 mg every 6 hours as needed for afib (Patient taking differently: Take 30 mg every 6 (six) hours as needed by mouth. May take an additional  30 mg every 6 hours as needed for afib)    . erythromycin ethylsuccinate (EES) 400 MG tablet Take 1,600 mg by mouth as directed. 1 hour prior to dental procedures    . flecainide (TAMBOCOR) 100 MG tablet Take 1 tablet (100 mg total) by mouth 2 (two) times daily. 180 tablet 2  . fluticasone (FLONASE) 50 MCG/ACT nasal spray Place 1 spray into both nostrils 2 (two) times daily.    Marland Kitchen HYDROcodone-homatropine (HYCODAN) 5-1.5 MG/5ML syrup Take 5 mLs every 6 (six) hours as needed by mouth.  0  . Hypromellose (ARTIFICIAL TEARS OP) Apply 1 drop every 4 (four) hours to eye.     . losartan-hydrochlorothiazide (HYZAAR) 100-25 MG tablet Take 1 tablet by mouth daily.  7  . montelukast (SINGULAIR) 10 MG tablet Take 10 mg by mouth at bedtime.    Marland Kitchen omeprazole (PRILOSEC) 40 MG capsule Take 40 mg by mouth every morning.    . ondansetron (ZOFRAN) 4 MG tablet Take 1 tablet (4 mg total) by mouth daily as needed for nausea or vomiting. 20 tablet 0  . potassium chloride SA (K-DUR,KLOR-CON) 20 MEQ tablet Take 1 tablet (20 mEq total) daily by mouth. 30 tablet 6  . umeclidinium-vilanterol (ANORO ELLIPTA) 62.5-25 MCG/INH AEPB Inhale 1 puff into the lungs daily.    Marland Kitchen warfarin (COUMADIN) 5 MG tablet Take 1 tablet (5 mg total) by mouth daily. (Patient taking differently: Take 2.5-5 mg by mouth every evening. Take 2.5 mg daily on Mon, Wed, and Fri, then take 5mg s daily on Tues, Thur, Sat, and Sun) 45 tablet 3   Current Facility-Administered Medications  Medication Dose Route Frequency Provider Last Rate Last Dose  . levalbuterol (XOPENEX) nebulizer solution 0.63 mg  0.63 mg Nebulization Q6H Baird Lyons D, MD   0.63 mg at 09/02/16 1027  . Mepolizumab SOLR 100 mg  100 mg Subcutaneous Q28 days Baird Lyons D, MD   100 mg at 12/22/16 1059  . Mepolizumab SOLR 100 mg  100 mg Subcutaneous Q28 days Baird Lyons D, MD   100 mg at 01/18/17 0934  . Mepolizumab SOLR 100 mg  100 mg Subcutaneous Q28 days Baird Lyons D, MD   100 mg at  02/16/17 1009    Physical Exam: Vitals:   03/05/17 0804  BP: 140/64  Pulse: (!) 50  SpO2: 98%  Weight: 205 lb (93 kg)  Height: 5' 3.5" (1.613 m)    GEN- The patient is overweight appearing, alert and oriented x 3 today.   Head- normocephalic, atraumatic Eyes-  Sclera clear, conjunctiva pink Ears- hearing intact Oropharynx- clear Lungs- Clear to ausculation bilaterally, normal work of breathing Heart- bradycardic irregular rhythm, no murmurs, rubs or gallops, PMI not laterally displaced GI- soft, NT, ND, + BS Extremities- no clubbing, cyanosis, or edema  EKG tracing ordered today is personally reviewed and shows sinus bradycardia  Assessment and Plan:  1. Paroxysmal atrial fibrillation and atrial flutter She recently had  afib Importance of lifestyle modification was discussed again today. Therapeutic strategies for afib including medicine (tikosyn) and ablation were discussed in detail with the patient today. At this time, she would prefer to continue her current strategy.  2. Obesity Body mass index is 35.74 kg/m. Lifestyle modification again discussed today  3. HTN Stable No change required today  4. Bradycardia Reduced diltiazem CD to 120mg  daily  5. Hypokalemia Recently started on replacement by AF clinic bmet today  Follow-up with Dr Domenic Polite as scheduled I will see in a year  Thompson Grayer MD, [facility] 03/05/2017 8:32 AM

## 2017-03-10 ENCOUNTER — Encounter: Payer: Self-pay | Admitting: *Deleted

## 2017-03-10 NOTE — Telephone Encounter (Signed)
Arrival Date: 03/10/17 # of Vials: 1 Lot #: [id number] Expiration Date: 05/2019

## 2017-03-12 ENCOUNTER — Telehealth: Payer: Self-pay | Admitting: *Deleted

## 2017-03-12 NOTE — Telephone Encounter (Signed)
Notes recorded by Laurine Blazer, LPN on 47/99/8721 at 8:43 AM EST Patient notified. Copy to pmd.   ------  Notes recorded by Thompson Grayer, MD on 03/11/2017 at 8:33 PM EST Results reviewed. Please inform pt of result.

## 2017-03-16 ENCOUNTER — Ambulatory Visit (INDEPENDENT_AMBULATORY_CARE_PROVIDER_SITE_OTHER): Payer: Medicare Other

## 2017-03-16 DIAGNOSIS — J4550 Severe persistent asthma, uncomplicated: Secondary | ICD-10-CM | POA: Diagnosis not present

## 2017-03-17 DIAGNOSIS — M9903 Segmental and somatic dysfunction of lumbar region: Secondary | ICD-10-CM | POA: Diagnosis not present

## 2017-03-17 DIAGNOSIS — M546 Pain in thoracic spine: Secondary | ICD-10-CM | POA: Diagnosis not present

## 2017-03-17 DIAGNOSIS — M9901 Segmental and somatic dysfunction of cervical region: Secondary | ICD-10-CM | POA: Diagnosis not present

## 2017-03-17 DIAGNOSIS — M542 Cervicalgia: Secondary | ICD-10-CM | POA: Diagnosis not present

## 2017-03-17 DIAGNOSIS — M545 Low back pain: Secondary | ICD-10-CM | POA: Diagnosis not present

## 2017-03-17 DIAGNOSIS — M9902 Segmental and somatic dysfunction of thoracic region: Secondary | ICD-10-CM | POA: Diagnosis not present

## 2017-03-19 DIAGNOSIS — E7800 Pure hypercholesterolemia, unspecified: Secondary | ICD-10-CM | POA: Diagnosis not present

## 2017-03-19 DIAGNOSIS — G4733 Obstructive sleep apnea (adult) (pediatric): Secondary | ICD-10-CM | POA: Diagnosis not present

## 2017-03-19 DIAGNOSIS — Z299 Encounter for prophylactic measures, unspecified: Secondary | ICD-10-CM | POA: Diagnosis not present

## 2017-03-19 DIAGNOSIS — I10 Essential (primary) hypertension: Secondary | ICD-10-CM | POA: Diagnosis not present

## 2017-03-19 DIAGNOSIS — Z6837 Body mass index (BMI) 37.0-37.9, adult: Secondary | ICD-10-CM | POA: Diagnosis not present

## 2017-03-19 DIAGNOSIS — I4891 Unspecified atrial fibrillation: Secondary | ICD-10-CM | POA: Diagnosis not present

## 2017-03-19 DIAGNOSIS — J449 Chronic obstructive pulmonary disease, unspecified: Secondary | ICD-10-CM | POA: Diagnosis not present

## 2017-03-19 DIAGNOSIS — M87059 Idiopathic aseptic necrosis of unspecified femur: Secondary | ICD-10-CM | POA: Diagnosis not present

## 2017-03-19 DIAGNOSIS — Z713 Dietary counseling and surveillance: Secondary | ICD-10-CM | POA: Diagnosis not present

## 2017-03-26 MED ORDER — MEPOLIZUMAB 100 MG ~~LOC~~ SOLR
100.0000 mg | SUBCUTANEOUS | Status: DC
  Administered 2017-03-16: 100 mg via SUBCUTANEOUS

## 2017-03-26 NOTE — Progress Notes (Signed)
Nucala injection documentation and charges entered by Maury Dus, RMA, based on injection sheet filled out by Alroy Bailiff per office protocol.

## 2017-03-31 DIAGNOSIS — M542 Cervicalgia: Secondary | ICD-10-CM | POA: Diagnosis not present

## 2017-03-31 DIAGNOSIS — Z789 Other specified health status: Secondary | ICD-10-CM | POA: Diagnosis not present

## 2017-03-31 DIAGNOSIS — Z6837 Body mass index (BMI) 37.0-37.9, adult: Secondary | ICD-10-CM | POA: Diagnosis not present

## 2017-03-31 DIAGNOSIS — M9901 Segmental and somatic dysfunction of cervical region: Secondary | ICD-10-CM | POA: Diagnosis not present

## 2017-03-31 DIAGNOSIS — M9902 Segmental and somatic dysfunction of thoracic region: Secondary | ICD-10-CM | POA: Diagnosis not present

## 2017-03-31 DIAGNOSIS — Z299 Encounter for prophylactic measures, unspecified: Secondary | ICD-10-CM | POA: Diagnosis not present

## 2017-03-31 DIAGNOSIS — J209 Acute bronchitis, unspecified: Secondary | ICD-10-CM | POA: Diagnosis not present

## 2017-03-31 DIAGNOSIS — M546 Pain in thoracic spine: Secondary | ICD-10-CM | POA: Diagnosis not present

## 2017-03-31 DIAGNOSIS — M9903 Segmental and somatic dysfunction of lumbar region: Secondary | ICD-10-CM | POA: Diagnosis not present

## 2017-03-31 DIAGNOSIS — I10 Essential (primary) hypertension: Secondary | ICD-10-CM | POA: Diagnosis not present

## 2017-03-31 DIAGNOSIS — J449 Chronic obstructive pulmonary disease, unspecified: Secondary | ICD-10-CM | POA: Diagnosis not present

## 2017-03-31 DIAGNOSIS — M545 Low back pain: Secondary | ICD-10-CM | POA: Diagnosis not present

## 2017-04-01 ENCOUNTER — Other Ambulatory Visit (HOSPITAL_COMMUNITY): Payer: Self-pay | Admitting: *Deleted

## 2017-04-01 MED ORDER — DILTIAZEM HCL ER COATED BEADS 120 MG PO CP24: 120.0000 mg | ORAL_CAPSULE | Freq: Every day | ORAL | 6 refills | Status: DC

## 2017-04-02 ENCOUNTER — Ambulatory Visit: Payer: Medicare Other | Admitting: Internal Medicine

## 2017-04-02 DIAGNOSIS — J440 Chronic obstructive pulmonary disease with acute lower respiratory infection: Secondary | ICD-10-CM | POA: Diagnosis not present

## 2017-04-02 DIAGNOSIS — I480 Paroxysmal atrial fibrillation: Secondary | ICD-10-CM | POA: Diagnosis not present

## 2017-04-02 DIAGNOSIS — G4733 Obstructive sleep apnea (adult) (pediatric): Secondary | ICD-10-CM | POA: Diagnosis present

## 2017-04-02 DIAGNOSIS — Z7901 Long term (current) use of anticoagulants: Secondary | ICD-10-CM | POA: Diagnosis not present

## 2017-04-02 DIAGNOSIS — Z880 Allergy status to penicillin: Secondary | ICD-10-CM | POA: Diagnosis not present

## 2017-04-02 DIAGNOSIS — J209 Acute bronchitis, unspecified: Secondary | ICD-10-CM | POA: Diagnosis not present

## 2017-04-02 DIAGNOSIS — Z79899 Other long term (current) drug therapy: Secondary | ICD-10-CM | POA: Diagnosis not present

## 2017-04-02 DIAGNOSIS — Z888 Allergy status to other drugs, medicaments and biological substances status: Secondary | ICD-10-CM | POA: Diagnosis not present

## 2017-04-02 DIAGNOSIS — J40 Bronchitis, not specified as acute or chronic: Secondary | ICD-10-CM | POA: Diagnosis not present

## 2017-04-02 DIAGNOSIS — J441 Chronic obstructive pulmonary disease with (acute) exacerbation: Secondary | ICD-10-CM | POA: Diagnosis not present

## 2017-04-02 DIAGNOSIS — I10 Essential (primary) hypertension: Secondary | ICD-10-CM | POA: Diagnosis present

## 2017-04-02 DIAGNOSIS — Z881 Allergy status to other antibiotic agents status: Secondary | ICD-10-CM | POA: Diagnosis not present

## 2017-04-12 DIAGNOSIS — J449 Chronic obstructive pulmonary disease, unspecified: Secondary | ICD-10-CM | POA: Diagnosis not present

## 2017-04-12 DIAGNOSIS — Z6837 Body mass index (BMI) 37.0-37.9, adult: Secondary | ICD-10-CM | POA: Diagnosis not present

## 2017-04-12 DIAGNOSIS — Z299 Encounter for prophylactic measures, unspecified: Secondary | ICD-10-CM | POA: Diagnosis not present

## 2017-04-12 DIAGNOSIS — I4891 Unspecified atrial fibrillation: Secondary | ICD-10-CM | POA: Diagnosis not present

## 2017-04-12 DIAGNOSIS — I10 Essential (primary) hypertension: Secondary | ICD-10-CM | POA: Diagnosis not present

## 2017-04-12 DIAGNOSIS — E7800 Pure hypercholesterolemia, unspecified: Secondary | ICD-10-CM | POA: Diagnosis not present

## 2017-04-14 ENCOUNTER — Telehealth: Payer: Self-pay | Admitting: Internal Medicine

## 2017-04-15 ENCOUNTER — Telehealth: Payer: Self-pay | Admitting: Internal Medicine

## 2017-04-15 NOTE — Telephone Encounter (Signed)
error 

## 2017-04-15 NOTE — Telephone Encounter (Signed)
Vials: 1 Order Date: 04/14/17 Ship Date: 04/14/17

## 2017-04-15 NOTE — Telephone Encounter (Signed)
Arrival Date: 04/15/17 # of Vials: 1 Lot #: [id number] Expiration Date: 05/2019

## 2017-04-16 DIAGNOSIS — Z299 Encounter for prophylactic measures, unspecified: Secondary | ICD-10-CM | POA: Diagnosis not present

## 2017-04-16 DIAGNOSIS — Z6837 Body mass index (BMI) 37.0-37.9, adult: Secondary | ICD-10-CM | POA: Diagnosis not present

## 2017-04-16 DIAGNOSIS — Z713 Dietary counseling and surveillance: Secondary | ICD-10-CM | POA: Diagnosis not present

## 2017-04-16 DIAGNOSIS — I4891 Unspecified atrial fibrillation: Secondary | ICD-10-CM | POA: Diagnosis not present

## 2017-04-21 ENCOUNTER — Ambulatory Visit: Payer: Medicare Other

## 2017-04-22 ENCOUNTER — Ambulatory Visit (INDEPENDENT_AMBULATORY_CARE_PROVIDER_SITE_OTHER): Payer: Medicare Other

## 2017-04-22 DIAGNOSIS — J4550 Severe persistent asthma, uncomplicated: Secondary | ICD-10-CM | POA: Diagnosis not present

## 2017-04-23 MED ORDER — MEPOLIZUMAB 100 MG ~~LOC~~ SOLR
100.0000 mg | SUBCUTANEOUS | Status: DC
  Administered 2017-04-22: 100 mg via SUBCUTANEOUS

## 2017-04-23 NOTE — Progress Notes (Signed)
Documentation of medication administration and charges of Nucala have been completed by Marlayna Bannister, CMA based on the Nucala documentation sheet completed by Tammy Scott.   

## 2017-04-27 ENCOUNTER — Encounter (INDEPENDENT_AMBULATORY_CARE_PROVIDER_SITE_OTHER): Payer: Self-pay | Admitting: Internal Medicine

## 2017-04-27 ENCOUNTER — Ambulatory Visit (INDEPENDENT_AMBULATORY_CARE_PROVIDER_SITE_OTHER): Payer: Medicare Other | Admitting: Internal Medicine

## 2017-04-27 VITALS — BP 124/56 | HR 66 | Resp 16 | Ht 63.0 in | Wt 207.2 lb

## 2017-04-27 DIAGNOSIS — K219 Gastro-esophageal reflux disease without esophagitis: Secondary | ICD-10-CM | POA: Diagnosis not present

## 2017-04-27 MED ORDER — ESOMEPRAZOLE MAGNESIUM 40 MG PO CPDR: 40.0000 mg | DELAYED_RELEASE_CAPSULE | Freq: Every day | ORAL | 5 refills | Status: DC

## 2017-04-27 NOTE — Patient Instructions (Addendum)
Please call office with progress report in 4 weeks or earlier if Esomeprazole does not work.

## 2017-04-27 NOTE — Progress Notes (Signed)
Presenting complaint;  Follow-up for GERD and nausea.  Database and subjective:  Patient is 77 year old Caucasian female who is here for scheduled visit.  She was initially evaluated in May last year for symptoms of GERD and nausea.  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  She return for abdominal ultrasound in June 2018.  Study revealed fatty liver but no cholelithiasis.  She had 4 mm pancreatic cyst.  Follow-up was recommended in 1 year.  She states she has not experienced much nausea over the last few months.  But she is nauseated this morning.  She says heartburn is not well controlled with PPI.  She generally takes 2 Tums after lunch almost every day.  She has made dietary changes.  She has cut back on pizza and [place] food.  She also eats slowly and chews her food thoroughly.  She has gained 6 pounds since her last visit.  [facility]  This is on hold while she is being treated for bronchitis.  She denies dysphagia vomiting abdominal pain melena or rectal bleeding. She is also having cough more frequently.  She feels it is related to her bronchitis and not manifestation of GERD.    Current Medications: Outpatient Encounter Medications as of 04/27/2017  Medication Sig  . acetaminophen (TYLENOL) 500 MG tablet Take 500 mg by mouth daily as needed for headache.  Marland Kitchen antiseptic oral rinse (BIOTENE) LIQD 15 mLs by Mouth Rinse route as needed for dry mouth.  Marland Kitchen atorvastatin (LIPITOR) 10 MG tablet Take 10 mg by mouth every evening.   . baclofen (LIORESAL) 10 MG tablet Take 10 mg by mouth as needed for muscle spasms.  . Biotin 1000 MCG tablet Take 1,000 mcg by mouth every morning.   . budesonide (PULMICORT) 0.5 MG/2ML nebulizer solution Take 0.5 mg by nebulization 2 (two) times daily.   . Calcium-Magnesium-Zinc (CAL-MAG-ZINC PO) Take 1 tablet 2 (two) times  daily by mouth.  . cetirizine (ZYRTEC) 10 MG tablet Take 10 mg by mouth daily as needed for allergies.  . cycloSPORINE (RESTASIS) 0.05 % ophthalmic emulsion Place 1 drop 2 (two) times daily into both eyes.   Marland Kitchen diltiazem (CARDIZEM) 30 MG tablet Take 1 tablet (30 mg total) by mouth 3 (three) times daily. May take an additional 30 mg every 6 hours as needed for afib (Patient taking differently: Take 30 mg every 6 (six) hours as needed by mouth. May take an additional 30 mg every 6 hours as needed for afib)  . diltiazem (CARTIA XT) 120 MG 24 hr capsule Take 120 mg by mouth daily.  Marland Kitchen erythromycin ethylsuccinate (EES) 400 MG tablet Take 1,600 mg by mouth as directed. 1 hour prior to dental procedures  . flecainide (TAMBOCOR) 100 MG tablet Take 1 tablet (100 mg total) by mouth 2 (two) times daily.  . fluticasone (FLONASE) 50 MCG/ACT nasal spray Place 1 spray into both nostrils 2 (two) times daily.  Marland Kitchen HYDROcodone-homatropine (HYCODAN) 5-1.5 MG/5ML syrup Take 5 mLs every 6 (six) hours as needed by mouth.  . Hypromellose (ARTIFICIAL TEARS OP) Apply 1 drop every 4 (four) hours to eye.   . losartan-hydrochlorothiazide (HYZAAR) 100-25 MG tablet Take 1 tablet by mouth daily.  . montelukast (SINGULAIR) 10 MG tablet Take 10 mg by mouth at bedtime.  Marland Kitchen omeprazole (PRILOSEC) 40 MG capsule Take 40 mg by mouth every morning.  Marland Kitchen  ondansetron (ZOFRAN) 4 MG tablet Take 1 tablet (4 mg total) by mouth daily as needed for nausea or vomiting.  . potassium chloride SA (K-DUR,KLOR-CON) 20 MEQ tablet Take 1 tablet (20 mEq total) daily by mouth.  . sodium chloride (OCEAN) 0.65 % SOLN nasal spray Place 1 spray into both nostrils as needed for congestion.  Marland Kitchen umeclidinium-vilanterol (ANORO ELLIPTA) 62.5-25 MCG/INH AEPB Inhale 1 puff into the lungs daily.  Marland Kitchen warfarin (COUMADIN) 5 MG tablet Take 1 tablet (5 mg total) by mouth daily. (Patient taking differently: Take 2.5-5 mg by mouth every evening. Take 2.5 mg daily on Mon, Wed, and  Fri, then take 5mg s daily on Tues, Thur, Sat, and Sun)  . [DISCONTINUED] diltiazem (CARDIZEM CD) 120 MG 24 hr capsule Take 1 capsule (120 mg total) by mouth daily. (Patient not taking: Reported on 04/27/2017)   Facility-Administered Encounter Medications as of 04/27/2017  Medication  . levalbuterol (XOPENEX) nebulizer solution 0.63 mg  . Mepolizumab SOLR 100 mg  . Mepolizumab SOLR 100 mg  . Mepolizumab SOLR 100 mg  . Mepolizumab SOLR 100 mg  . Mepolizumab SOLR 100 mg     Objective: Blood pressure (!) 124/56, pulse 66, resp. rate 16, height 5\' 3"  (1.6 m), weight 207 lb 3.2 oz (94 kg). Patient is alert and in no acute distress. Conjunctiva is pink. Sclera is nonicteric Oropharyngeal mucosa is normal. No neck masses or thyromegaly noted. Cardiac exam with regular rhythm normal S1 and S2. No murmur or gallop noted. Lungs are clear to auscultation. Abdomen organomegaly or masses. No LE edema or clubbing noted.  Assessment:  #1.  Chronic GERD.  Symptoms are not well controlled.  Omeprazole is not as effective as before.  Will change prescription to another PPI.  #2.  History of nausea.  This was bothersome symptom to her last year.  Workup was negative for peptic ulcer disease or cholelithiasis. No further workup unless nausea returns.  #3.  Incidental finding of 4 mm pancreatic cyst.   Plan:  Continue omeprazole. Esomeprazole 40 mg p.o. every morning. Patient will call with progress report in 4 weeks or earlier if S Esomeprazole does not work. Office visit in 1 year. Pancreatic US in one year.

## 2017-05-04 ENCOUNTER — Other Ambulatory Visit (HOSPITAL_COMMUNITY): Payer: Self-pay | Admitting: *Deleted

## 2017-05-04 ENCOUNTER — Telehealth: Payer: Self-pay | Admitting: Cardiology

## 2017-05-04 DIAGNOSIS — M9902 Segmental and somatic dysfunction of thoracic region: Secondary | ICD-10-CM | POA: Diagnosis not present

## 2017-05-04 DIAGNOSIS — M9903 Segmental and somatic dysfunction of lumbar region: Secondary | ICD-10-CM | POA: Diagnosis not present

## 2017-05-04 DIAGNOSIS — M546 Pain in thoracic spine: Secondary | ICD-10-CM | POA: Diagnosis not present

## 2017-05-04 DIAGNOSIS — M545 Low back pain: Secondary | ICD-10-CM | POA: Diagnosis not present

## 2017-05-04 DIAGNOSIS — M9901 Segmental and somatic dysfunction of cervical region: Secondary | ICD-10-CM | POA: Diagnosis not present

## 2017-05-04 DIAGNOSIS — M542 Cervicalgia: Secondary | ICD-10-CM | POA: Diagnosis not present

## 2017-05-04 MED ORDER — FLECAINIDE ACETATE 100 MG PO TABS: 100.0000 mg | ORAL_TABLET | Freq: Two times a day (BID) | ORAL | 2 refills | Status: DC

## 2017-05-04 NOTE — Telephone Encounter (Signed)
Pt says

## 2017-05-04 NOTE — Telephone Encounter (Signed)
Pt aware and voiced understanding - will make an appt with Dr Manuella Ghazi

## 2017-05-04 NOTE — Telephone Encounter (Signed)
Pt says has been feeling increasingly tired and having trouble finding her words. Was concerned it was potassium (last labs were in December and potassium was normal at that time) denies increased SOB/dizziness/swelling - says BP/HR has been WNL. Pt scheduled with Dr Domenic Polite 3/29

## 2017-05-04 NOTE — Telephone Encounter (Signed)
Patient called stating that she has been having fatigue and and problems remembering something for approximately a week now.  She is concerned if her Potassium  Is to low or high. States that Roderic Palau in the A. Fib put her on this.

## 2017-05-04 NOTE — Telephone Encounter (Signed)
Would ask her to see her PCP soon to make sure what she is describing does not represent any change in neurological status that might need to be evaluated more quickly.  Would doubt that this is related to potassium level.

## 2017-05-13 ENCOUNTER — Telehealth: Payer: Self-pay | Admitting: Internal Medicine

## 2017-05-13 NOTE — Telephone Encounter (Signed)
Vials: 1 Order Date: 05/13/2017 Ship Date: 05/13/2017

## 2017-05-14 NOTE — Telephone Encounter (Signed)
Arrival Date: 05/14/17 # of Vials: 1 Lot #: [id number] Expiration Date: 04/2020

## 2017-05-19 ENCOUNTER — Ambulatory Visit: Payer: Medicare Other

## 2017-05-19 ENCOUNTER — Telehealth: Payer: Self-pay | Admitting: Cardiology

## 2017-05-19 NOTE — Telephone Encounter (Signed)
Patient c/o Palpitations:  High priority if patient c/o lightheadedness, shortness of breath, or chest pain  1) How long have you had palpitations/irregular HR/ Afib? Are you having the symptoms now? AFib since 100am   2) Are you currently experiencing lightheadedness, SOB or CP? No   3) Do you have a history of afib (atrial fibrillation) or irregular heart rhythm? yes  4) Have you checked your BP or HR? (document readings if available):   5) Are you experiencing any other symptoms? No

## 2017-05-19 NOTE — Telephone Encounter (Signed)
Was in AFib around Thanksgiving - wanted to do ablation - but went back to normal on her own.  Did have another episode in January - came out on her own.  Did have episode this morning - did notice that she felt more tired yesterday.  HR ranging between 100 - 115.  Stated that she did pull muscle in shoulder the other day.  Diltiazem CD 120mg  daily & doing the Diltiazem 30mg  every 4 hours.  No c/o chest pain, dizziness, or SOB currently.  States she usually goes back in on her own in about 8 days or so.  She questions doing any long term damage if she stays like this any length of time.    130/85 108  Seems normal now.    138/88  107    Stated that she really did not want to go to the AFib clinic because of it being in [place].

## 2017-05-20 ENCOUNTER — Ambulatory Visit: Payer: Medicare Other

## 2017-05-21 ENCOUNTER — Ambulatory Visit (INDEPENDENT_AMBULATORY_CARE_PROVIDER_SITE_OTHER): Payer: Medicare Other

## 2017-05-21 DIAGNOSIS — M9901 Segmental and somatic dysfunction of cervical region: Secondary | ICD-10-CM | POA: Diagnosis not present

## 2017-05-21 DIAGNOSIS — M9903 Segmental and somatic dysfunction of lumbar region: Secondary | ICD-10-CM | POA: Diagnosis not present

## 2017-05-21 DIAGNOSIS — J4520 Mild intermittent asthma, uncomplicated: Secondary | ICD-10-CM

## 2017-05-21 DIAGNOSIS — M542 Cervicalgia: Secondary | ICD-10-CM | POA: Diagnosis not present

## 2017-05-21 DIAGNOSIS — M545 Low back pain: Secondary | ICD-10-CM | POA: Diagnosis not present

## 2017-05-21 DIAGNOSIS — M546 Pain in thoracic spine: Secondary | ICD-10-CM | POA: Diagnosis not present

## 2017-05-21 DIAGNOSIS — M9902 Segmental and somatic dysfunction of thoracic region: Secondary | ICD-10-CM | POA: Diagnosis not present

## 2017-05-24 ENCOUNTER — Telehealth: Payer: Self-pay | Admitting: Internal Medicine

## 2017-05-24 MED ORDER — MEPOLIZUMAB 100 MG ~~LOC~~ SOLR
100.0000 mg | SUBCUTANEOUS | Status: DC
  Administered 2017-05-21: 100 mg via SUBCUTANEOUS

## 2017-05-24 NOTE — Telephone Encounter (Signed)
Patient called stating that she is ready to discuss with  Dr. Rayann Heman the starting of Tikosyn.

## 2017-05-26 NOTE — Telephone Encounter (Signed)
Patient calling and states that she is considering starting Tikosyn now. Made Dr. Rayann Heman aware. Per Dr. Rayann Heman, patient to be seen with Roderic Palau, NP in the Afib clinic to discuss and arrange. Made patient aware that I would send a message to Donna's RN to coordinate. Patient verbalized understanding and thanked me for the call.

## 2017-05-26 NOTE — Telephone Encounter (Signed)
Went over [name] [name] admission with patient - she will check on pricing as well as possibility of patient assistance qualification. She would prefer TEE prior to admit instead of weekly INRs. She understands she would have to stop flecainide 3 days prior to admit. She will let me know the outcome of cost.

## 2017-05-26 NOTE — Telephone Encounter (Signed)
05-26-17 pt following up on message from 05-24-17, ready to discuss Tikosyn-pls call [phone]

## 2017-05-27 ENCOUNTER — Telehealth: Payer: Self-pay | Admitting: Pharmacist

## 2017-05-27 NOTE — Telephone Encounter (Signed)
I think follow-up with general cardiology in [place] or AF clinic would be most appropriate.  Looks like she has been in conversation with AF clinic (05/24/17 note from Rushie Goltz is reviewed)

## 2017-05-27 NOTE — Telephone Encounter (Signed)
Medication list reviewed in anticipation of upcoming Tikosyn initiation. Patient will need to stop flecainide 3 days prior to initiation. Patient is also taking losartan/HCTZ and this will need to be changed to losartan alone and HCTZ will need to be discontinued. Pt has prophylactic RX for erythromycin and this is contraindicated with Tikosyn. Given penicillin allergy can use clindamycin 600mg  30-86minutes before dental procedures. Would also recommend limit/avoid Zofran use due to Qtc prolongation.   Patient is anticoagulated on warfarin and has elected to do TEE rather than 4 weekly INRs. Will still want to make sure her INR is therapeutic prior to initiation.   Patient will need to be counseled to avoid use of Benadryl while on Tikosyn and in the 2-3 days prior to Tikosyn initiation.

## 2017-06-01 DIAGNOSIS — I788 Other diseases of capillaries: Secondary | ICD-10-CM | POA: Diagnosis not present

## 2017-06-01 DIAGNOSIS — L2084 Intrinsic (allergic) eczema: Secondary | ICD-10-CM | POA: Diagnosis not present

## 2017-06-01 DIAGNOSIS — L820 Inflamed seborrheic keratosis: Secondary | ICD-10-CM | POA: Diagnosis not present

## 2017-06-01 DIAGNOSIS — L570 Actinic keratosis: Secondary | ICD-10-CM | POA: Diagnosis not present

## 2017-06-01 DIAGNOSIS — D1801 Hemangioma of skin and subcutaneous tissue: Secondary | ICD-10-CM | POA: Diagnosis not present

## 2017-06-01 DIAGNOSIS — L304 Erythema intertrigo: Secondary | ICD-10-CM | POA: Diagnosis not present

## 2017-06-01 DIAGNOSIS — L821 Other seborrheic keratosis: Secondary | ICD-10-CM | POA: Diagnosis not present

## 2017-06-01 DIAGNOSIS — Z85828 Personal history of other malignant neoplasm of skin: Secondary | ICD-10-CM | POA: Diagnosis not present

## 2017-06-09 DIAGNOSIS — Z6837 Body mass index (BMI) 37.0-37.9, adult: Secondary | ICD-10-CM | POA: Diagnosis not present

## 2017-06-09 DIAGNOSIS — G4733 Obstructive sleep apnea (adult) (pediatric): Secondary | ICD-10-CM | POA: Diagnosis not present

## 2017-06-09 DIAGNOSIS — Z299 Encounter for prophylactic measures, unspecified: Secondary | ICD-10-CM | POA: Diagnosis not present

## 2017-06-09 DIAGNOSIS — I4891 Unspecified atrial fibrillation: Secondary | ICD-10-CM | POA: Diagnosis not present

## 2017-06-09 DIAGNOSIS — M791 Myalgia, unspecified site: Secondary | ICD-10-CM | POA: Diagnosis not present

## 2017-06-09 DIAGNOSIS — J449 Chronic obstructive pulmonary disease, unspecified: Secondary | ICD-10-CM | POA: Diagnosis not present

## 2017-06-11 ENCOUNTER — Telehealth: Payer: Self-pay | Admitting: Internal Medicine

## 2017-06-11 NOTE — Telephone Encounter (Signed)
1 Vial Order Date: 3.15.19 Shipping Date: 3.18.19

## 2017-06-14 DIAGNOSIS — Z6836 Body mass index (BMI) 36.0-36.9, adult: Secondary | ICD-10-CM | POA: Diagnosis not present

## 2017-06-14 DIAGNOSIS — I4891 Unspecified atrial fibrillation: Secondary | ICD-10-CM | POA: Diagnosis not present

## 2017-06-14 DIAGNOSIS — Z789 Other specified health status: Secondary | ICD-10-CM | POA: Diagnosis not present

## 2017-06-14 DIAGNOSIS — Z299 Encounter for prophylactic measures, unspecified: Secondary | ICD-10-CM | POA: Diagnosis not present

## 2017-06-14 DIAGNOSIS — G250 Essential tremor: Secondary | ICD-10-CM | POA: Diagnosis not present

## 2017-06-14 DIAGNOSIS — J449 Chronic obstructive pulmonary disease, unspecified: Secondary | ICD-10-CM | POA: Diagnosis not present

## 2017-06-14 NOTE — Telephone Encounter (Signed)
Arrival Date: 06/14/2017 # of Vials: 1 Lot #: [id number] Expiration Date: 05/2020

## 2017-06-17 DIAGNOSIS — R001 Bradycardia, unspecified: Secondary | ICD-10-CM | POA: Diagnosis not present

## 2017-06-17 DIAGNOSIS — I10 Essential (primary) hypertension: Secondary | ICD-10-CM | POA: Diagnosis not present

## 2017-06-17 DIAGNOSIS — Z299 Encounter for prophylactic measures, unspecified: Secondary | ICD-10-CM | POA: Diagnosis not present

## 2017-06-17 DIAGNOSIS — J45909 Unspecified asthma, uncomplicated: Secondary | ICD-10-CM | POA: Diagnosis not present

## 2017-06-17 DIAGNOSIS — Z6837 Body mass index (BMI) 37.0-37.9, adult: Secondary | ICD-10-CM | POA: Diagnosis not present

## 2017-06-17 DIAGNOSIS — R05 Cough: Secondary | ICD-10-CM | POA: Diagnosis not present

## 2017-06-17 DIAGNOSIS — I4891 Unspecified atrial fibrillation: Secondary | ICD-10-CM | POA: Diagnosis not present

## 2017-06-18 ENCOUNTER — Ambulatory Visit (INDEPENDENT_AMBULATORY_CARE_PROVIDER_SITE_OTHER): Payer: Medicare Other

## 2017-06-18 DIAGNOSIS — J4520 Mild intermittent asthma, uncomplicated: Secondary | ICD-10-CM | POA: Diagnosis not present

## 2017-06-21 MED ORDER — MEPOLIZUMAB 100 MG ~~LOC~~ SOLR
100.0000 mg | Freq: Once | SUBCUTANEOUS | Status: AC
  Administered 2017-06-18: 100 mg via SUBCUTANEOUS

## 2017-06-24 DIAGNOSIS — Z299 Encounter for prophylactic measures, unspecified: Secondary | ICD-10-CM | POA: Diagnosis not present

## 2017-06-24 DIAGNOSIS — I4891 Unspecified atrial fibrillation: Secondary | ICD-10-CM | POA: Diagnosis not present

## 2017-06-24 DIAGNOSIS — B373 Candidiasis of vulva and vagina: Secondary | ICD-10-CM | POA: Diagnosis not present

## 2017-06-24 DIAGNOSIS — M87059 Idiopathic aseptic necrosis of unspecified femur: Secondary | ICD-10-CM | POA: Diagnosis not present

## 2017-06-24 DIAGNOSIS — J449 Chronic obstructive pulmonary disease, unspecified: Secondary | ICD-10-CM | POA: Diagnosis not present

## 2017-06-24 DIAGNOSIS — Z6837 Body mass index (BMI) 37.0-37.9, adult: Secondary | ICD-10-CM | POA: Diagnosis not present

## 2017-06-24 NOTE — Progress Notes (Signed)
Cardiology Office Note  Date: 06/25/2017   ID: [name] [name], DOB [birth year], MRN [id number]  PCP: Monico Blitz, MD  Primary Cardiologist: Rozann Lesches, MD   Chief Complaint  Patient presents with  . PAF    History of Present Illness: [name] [name] is a 77 y.o. female last seen by Dr. Rayann Heman in December 2018, I reviewed the note.  She presents today stating that she has done fairly well overall.  She had been very close to considering initiation of Tikosyn, however has decided to hold off for now unless her symptoms become more frequent.  I reviewed her medications.  Cardiac regimen includes Cartia XT 120 mg daily, as needed Cardizem 30 mg for breakthrough atrial fibrillation, flecainide 100 mg twice daily, and Coumadin.  As noted previously, sick sinus syndrome also limits treatment of her atrial arrhythmia in terms of medication adjustments.  She does not report any sudden dizziness or syncope.  I personally reviewed her recent ECG from 06/17/2017 which showed sinus bradycardia at 42 bpm, left anterior fascicular block.   Past Medical History:  Diagnosis Date  . Allergic rhinitis   . Anxiety   . Atrial flutter ([place])   . COPD (chronic obstructive pulmonary disease) ([place])   . Depression   . Essential hypertension   . Hyperlipidemia   . Lumbar disc disease   . Obesity   . OSA (obstructive sleep apnea)    CPAP  . Paroxysmal atrial fibrillation ([facility])    Element of tachycardia bradycardia syndrome  . Respiratory failure [facility]     Past Surgical History:  Procedure Laterality Date  . ANTERIOR FUSION LUMBAR SPINE    . BIOPSY  08/27/2016   Procedure: BIOPSY;  Surgeon: Rogene Houston, MD;  Location: [facility] ENDO SUITE;  Service: Endoscopy;;  FOUR GASTRIC POLYPS BIOPSIED  . CARDIOVERSION N/A 03/28/2014   Procedure: CARDIOVERSION;  Surgeon: Fay Records, MD;  Location: [facility] ORS;  Service: Cardiovascular;  Laterality: N/A;  . CARDIOVERSION N/A 10/22/2016   Procedure:  CARDIOVERSION;  Surgeon: Sueanne Margarita, MD;  Location: [facility] ENDOSCOPY;  Service: Cardiovascular;  Laterality: N/A;  . CATARACT EXTRACTION    . COLONOSCOPY N/A 08/04/2012   Procedure: COLONOSCOPY;  Surgeon: Rogene Houston, MD;  Location: [facility] ENDO [facility];  Service: Endoscopy;  Laterality: N/A;  730-rescheduled to [place] notified pt  . ELECTROPHYSIOLOGIC STUDY N/A 09/18/2014   Procedure: Atrial Fibrillation Ablation;  Surgeon: Thompson Grayer, MD;  Location: [place] CV LAB;  Service: Cardiovascular;  Laterality: N/A;  . ESOPHAGOGASTRODUODENOSCOPY N/A 08/27/2016   Procedure: ESOPHAGOGASTRODUODENOSCOPY (EGD);  Surgeon: Rogene Houston, MD;  Location: [facility];  Service: Endoscopy;  Laterality: N/A;  1200  . LASIK     Both eyes  . TEE WITHOUT CARDIOVERSION N/A 09/18/2014   Procedure: TRANSESOPHAGEAL ECHOCARDIOGRAM (TEE);  Surgeon: Larey Dresser, MD;  Location: [place];  Service: Cardiovascular;  Laterality: N/A;  . TEE WITHOUT CARDIOVERSION N/A 10/22/2016   Procedure: TRANSESOPHAGEAL ECHOCARDIOGRAM (TEE);  Surgeon: Sueanne Margarita, MD;  Location: [facility] ENDOSCOPY;  Service: Cardiovascular;  Laterality: N/A;  . TOTAL HIP ARTHROPLASTY  2010   Right    Current Outpatient Medications  Medication Sig Dispense Refill  . acetaminophen (TYLENOL) 500 MG tablet Take 500 mg by mouth daily as needed for headache.    Marland Kitchen antiseptic oral rinse (BIOTENE) LIQD 15 mLs by Mouth Rinse route as needed for dry mouth.    Marland Kitchen atorvastatin (LIPITOR) 10 MG tablet Take 10 mg by  mouth every evening.     . baclofen (LIORESAL) 10 MG tablet Take 10 mg by mouth as needed for muscle spasms.    . Biotin 1000 MCG tablet Take 1,000 mcg by mouth every morning.     . budesonide (PULMICORT) 0.5 MG/2ML nebulizer solution Take 0.5 mg by nebulization 2 (two) times daily.     . Calcium-Magnesium-Zinc (CAL-MAG-ZINC PO) Take 1 tablet 2 (two) times daily by mouth.    . cetirizine (ZYRTEC) 10 MG tablet Take 10 mg by mouth daily as needed  for allergies.    . ciprofloxacin (CIPRO) 500 MG tablet Take 500 mg by mouth daily.    . cycloSPORINE (RESTASIS) 0.05 % ophthalmic emulsion Place 1 drop 2 (two) times daily into both eyes.     Marland Kitchen diltiazem (CARDIZEM) 30 MG tablet Take 1 tablet (30 mg total) by mouth 3 (three) times daily. May take an additional 30 mg every 6 hours as needed for afib (Patient taking differently: Take 30 mg every 6 (six) hours as needed by mouth. May take an additional 30 mg every 6 hours as needed for afib)    . diltiazem (CARTIA XT) 120 MG 24 hr capsule Take 120 mg by mouth daily.    Marland Kitchen erythromycin ethylsuccinate (EES) 400 MG tablet Take 1,600 mg by mouth as directed. 1 hour prior to dental procedures    . esomeprazole (NEXIUM) 40 MG capsule Take 1 capsule (40 mg total) by mouth daily before breakfast. 30 capsule 5  . flecainide (TAMBOCOR) 100 MG tablet Take 1 tablet (100 mg total) by mouth 2 (two) times daily. 180 tablet 2  . fluticasone (FLONASE) 50 MCG/ACT nasal spray Place 1 spray into both nostrils 2 (two) times daily.    Marland Kitchen HYDROcodone-homatropine (HYCODAN) 5-1.5 MG/5ML syrup Take 5 mLs every 6 (six) hours as needed by mouth.  0  . Hypromellose (ARTIFICIAL TEARS OP) Apply 1 drop every 4 (four) hours to eye.     . losartan-hydrochlorothiazide (HYZAAR) 100-25 MG tablet Take 1 tablet by mouth daily.  7  . Mepolizumab (NUCALA [place]) Inject into the skin every 30 (thirty) days.    . montelukast (SINGULAIR) 10 MG tablet Take 10 mg by mouth at bedtime.    . ondansetron (ZOFRAN) 4 MG tablet Take 1 tablet (4 mg total) by mouth daily as needed for nausea or vomiting. 20 tablet 0  . potassium chloride SA (K-DUR,KLOR-CON) 20 MEQ tablet Take 1 tablet (20 mEq total) daily by mouth. 30 tablet 6  . sodium chloride (OCEAN) 0.65 % SOLN nasal spray Place 1 spray into both nostrils as needed for congestion.    Marland Kitchen umeclidinium-vilanterol (ANORO ELLIPTA) 62.5-25 MCG/INH AEPB Inhale 1 puff into the lungs daily.    Marland Kitchen warfarin (COUMADIN) 5  MG tablet Take 1 tablet (5 mg total) by mouth daily. (Patient taking differently: Take 2.5-5 mg by mouth every evening. Take 2.5 mg daily on Mon, Wed, and Fri, then take 5mg s daily on Tues, Thur, Sat, and Sun) 45 tablet 3  . Wheat Dextrin (BENEFIBER ON THE GO) POWD Take 4 g by mouth daily.     Current Facility-Administered Medications  Medication Dose Route Frequency Provider Last Rate Last Dose  . levalbuterol (XOPENEX) nebulizer solution 0.63 mg  0.63 mg Nebulization Q6H Young, Clinton D, MD   0.63 mg at 09/02/16 1027   Allergies:  Bupropion hcl; Escitalopram oxalate; Fluticasone-salmeterol; Lisinopril; Serevent; Macrodantin [nitrofurantoin]; and Penicillins   Social History: The patient  reports that she has never smoked.  She has never used smokeless tobacco. She reports that she does not drink alcohol or use drugs.   ROS:  Please see the history of present illness. Otherwise, complete review of systems is positive for recurrent URIs.  All other systems are reviewed and negative.   Physical Exam: VS:  BP 130/62   Pulse (!) 50   Ht 5' 3.5" (1.613 m)   Wt 202 lb (91.6 kg)   SpO2 98%   BMI 35.22 kg/m , BMI Body mass index is 35.22 kg/m.  Wt Readings from Last 3 Encounters:  06/25/17 202 lb (91.6 kg)  04/27/17 207 lb 3.2 oz (94 kg)  03/05/17 205 lb (93 kg)    General: Patient appears comfortable at rest. HEENT: Conjunctiva and lids normal, oropharynx clear. Neck: Supple, no elevated JVP or carotid bruits, no thyromegaly. Lungs: Diminished breath sounds without active wheezing, nonlabored breathing at rest. Cardiac: Regular rate and rhythm, no S3 or significant systolic murmur, no pericardial rub. Abdomen: Soft, nontender, bowel sounds present. Extremities: No pitting edema, distal pulses 2+. Skin: Warm and dry. Musculoskeletal: No kyphosis. Neuropsychiatric: Alert and oriented x3, affect grossly appropriate.  ECG: I personally reviewed the tracing from 03/05/2017 which showed  sinus bradycardia with R' in lead V1 and V2, left anterior fascicular block, poor R wave progression.  Recent Labwork: 02/10/2017: BUN 16; Creatinine, Ser 1.06; Hemoglobin 12.7; Platelets 172; Potassium 3.2; Sodium 137  Other Studies Reviewed Today:  TEE 10/22/2016: Study Conclusions  - Left ventricle: Systolic function was normal. The estimated   ejection fraction was in the range of 55% to 60%. Wall motion was   normal; there were no regional wall motion abnormalities. - Left atrium: The atrium was moderately dilated. No evidence of   thrombus in the atrial cavity or appendage. - Right atrium: The atrium was moderately dilated. No evidence of   thrombus in the atrial cavity or appendage. - Tricuspid valve: There was moderate regurgitation.  Assessment and Plan:  1.  Paroxysmal atrial fibrillation and flutter.  She is in sinus rhythm today.  I reviewed her recent ECG.  As noted above, she would like to hold off on initiation of Tikosyn at this point unless her symptoms worsen.  If she manifests more frequent breakthrough events, I have asked her to contact Dr. Jackalyn Lombard team to arrange for [facility] admission.  2.  COPD, continue to follow with Dr. Manuella Ghazi and Dr. Annamaria Boots.  3.  OSA on CPAP.  4.  Sick sinus syndrome with bradycardia.  5.  Essential hypertension, blood pressure control is adequate today.  Current medicines were reviewed with the patient today.  Disposition: Follow-up in 6 months, sooner if needed.  Signed, Satira Sark, MD, [facility] 06/25/2017 9:59 AM    [facility] [address], [place], [place] [address] Phone: [phone]; Fax: [phone]

## 2017-06-25 ENCOUNTER — Encounter: Payer: Self-pay | Admitting: Cardiology

## 2017-06-25 ENCOUNTER — Ambulatory Visit (INDEPENDENT_AMBULATORY_CARE_PROVIDER_SITE_OTHER): Payer: Medicare Other | Admitting: Cardiology

## 2017-06-25 VITALS — BP 130/62 | HR 50 | Ht 63.5 in | Wt 202.0 lb

## 2017-06-25 DIAGNOSIS — I10 Essential (primary) hypertension: Secondary | ICD-10-CM

## 2017-06-25 DIAGNOSIS — I495 Sick sinus syndrome: Secondary | ICD-10-CM | POA: Diagnosis not present

## 2017-06-25 DIAGNOSIS — G4733 Obstructive sleep apnea (adult) (pediatric): Secondary | ICD-10-CM

## 2017-06-25 DIAGNOSIS — I480 Paroxysmal atrial fibrillation: Secondary | ICD-10-CM

## 2017-06-25 DIAGNOSIS — J449 Chronic obstructive pulmonary disease, unspecified: Secondary | ICD-10-CM | POA: Diagnosis not present

## 2017-06-25 NOTE — Patient Instructions (Addendum)

## 2017-07-08 ENCOUNTER — Telehealth: Payer: Self-pay | Admitting: Internal Medicine

## 2017-07-08 NOTE — Telephone Encounter (Signed)
Vials: 1 Order Date:  07/08/2017 Ship Date: 07/12/2017

## 2017-07-09 DIAGNOSIS — I10 Essential (primary) hypertension: Secondary | ICD-10-CM | POA: Diagnosis not present

## 2017-07-09 DIAGNOSIS — Z7189 Other specified counseling: Secondary | ICD-10-CM | POA: Diagnosis not present

## 2017-07-09 DIAGNOSIS — R5383 Other fatigue: Secondary | ICD-10-CM | POA: Diagnosis not present

## 2017-07-09 DIAGNOSIS — Z0000 Encounter for general adult medical examination without abnormal findings: Secondary | ICD-10-CM | POA: Diagnosis not present

## 2017-07-09 DIAGNOSIS — Z1339 Encounter for screening examination for other mental health and behavioral disorders: Secondary | ICD-10-CM | POA: Diagnosis not present

## 2017-07-09 DIAGNOSIS — Z299 Encounter for prophylactic measures, unspecified: Secondary | ICD-10-CM | POA: Diagnosis not present

## 2017-07-09 DIAGNOSIS — Z1331 Encounter for screening for depression: Secondary | ICD-10-CM | POA: Diagnosis not present

## 2017-07-09 DIAGNOSIS — J449 Chronic obstructive pulmonary disease, unspecified: Secondary | ICD-10-CM | POA: Diagnosis not present

## 2017-07-09 DIAGNOSIS — K219 Gastro-esophageal reflux disease without esophagitis: Secondary | ICD-10-CM | POA: Diagnosis not present

## 2017-07-09 DIAGNOSIS — Z6837 Body mass index (BMI) 37.0-37.9, adult: Secondary | ICD-10-CM | POA: Diagnosis not present

## 2017-07-09 DIAGNOSIS — Z79899 Other long term (current) drug therapy: Secondary | ICD-10-CM | POA: Diagnosis not present

## 2017-07-09 DIAGNOSIS — I4891 Unspecified atrial fibrillation: Secondary | ICD-10-CM | POA: Diagnosis not present

## 2017-07-09 DIAGNOSIS — E559 Vitamin D deficiency, unspecified: Secondary | ICD-10-CM | POA: Diagnosis not present

## 2017-07-09 DIAGNOSIS — Z1211 Encounter for screening for malignant neoplasm of colon: Secondary | ICD-10-CM | POA: Diagnosis not present

## 2017-07-09 NOTE — Telephone Encounter (Signed)
Arrival Date: 07/09/17  # of Vials: 1 Lot #: [id number] Expiration Date: 07/28/2020

## 2017-07-15 DIAGNOSIS — I4891 Unspecified atrial fibrillation: Secondary | ICD-10-CM | POA: Diagnosis not present

## 2017-07-15 DIAGNOSIS — Z299 Encounter for prophylactic measures, unspecified: Secondary | ICD-10-CM | POA: Diagnosis not present

## 2017-07-15 DIAGNOSIS — Z6837 Body mass index (BMI) 37.0-37.9, adult: Secondary | ICD-10-CM | POA: Diagnosis not present

## 2017-07-15 DIAGNOSIS — G4730 Sleep apnea, unspecified: Secondary | ICD-10-CM | POA: Diagnosis not present

## 2017-07-15 DIAGNOSIS — J449 Chronic obstructive pulmonary disease, unspecified: Secondary | ICD-10-CM | POA: Diagnosis not present

## 2017-07-19 ENCOUNTER — Encounter: Payer: Self-pay | Admitting: Internal Medicine

## 2017-07-19 ENCOUNTER — Ambulatory Visit: Payer: Medicare Other

## 2017-07-20 ENCOUNTER — Ambulatory Visit: Payer: Medicare Other

## 2017-07-20 ENCOUNTER — Encounter: Payer: Self-pay | Admitting: Internal Medicine

## 2017-07-20 ENCOUNTER — Ambulatory Visit (INDEPENDENT_AMBULATORY_CARE_PROVIDER_SITE_OTHER): Payer: Medicare Other | Admitting: Internal Medicine

## 2017-07-20 ENCOUNTER — Ambulatory Visit (INDEPENDENT_AMBULATORY_CARE_PROVIDER_SITE_OTHER): Payer: Medicare Other

## 2017-07-20 VITALS — BP 122/72 | HR 50 | Ht 63.5 in | Wt 206.0 lb

## 2017-07-20 DIAGNOSIS — J4541 Moderate persistent asthma with (acute) exacerbation: Secondary | ICD-10-CM

## 2017-07-20 DIAGNOSIS — J4520 Mild intermittent asthma, uncomplicated: Secondary | ICD-10-CM

## 2017-07-20 DIAGNOSIS — J45901 Unspecified asthma with (acute) exacerbation: Secondary | ICD-10-CM

## 2017-07-20 DIAGNOSIS — G4733 Obstructive sleep apnea (adult) (pediatric): Secondary | ICD-10-CM | POA: Diagnosis not present

## 2017-07-20 MED ORDER — PREDNISONE 10 MG PO TABS: ORAL_TABLET | ORAL | 0 refills | Status: DC

## 2017-07-20 MED ORDER — LEVALBUTEROL HCL 0.63 MG/3ML IN NEBU
0.6300 mg | INHALATION_SOLUTION | RESPIRATORY_TRACT | Status: AC
  Administered 2017-07-20: 0.63 mg via RESPIRATORY_TRACT

## 2017-07-20 MED ORDER — UMECLIDINIUM-VILANTEROL 62.5-25 MCG/INH IN AEPB: 1.0000 | INHALATION_SPRAY | Freq: Every day | RESPIRATORY_TRACT | 0 refills | Status: DC

## 2017-07-20 MED ORDER — METHYLPREDNISOLONE ACETATE 80 MG/ML IJ SUSP
80.0000 mg | Freq: Once | INTRAMUSCULAR | Status: AC
  Administered 2017-07-20: 80 mg via INTRAMUSCULAR

## 2017-07-20 MED ORDER — METHYLPREDNISOLONE ACETATE 80 MG/ML IJ SUSP: 80.0000 mg | Freq: Once | INTRAMUSCULAR | 0 refills | Status: DC

## 2017-07-20 MED ORDER — LEVALBUTEROL HCL 0.63 MG/3ML IN NEBU: 0.6300 mg | INHALATION_SOLUTION | RESPIRATORY_TRACT | Status: DC

## 2017-07-20 MED ORDER — METHYLPREDNISOLONE ACETATE 80 MG/ML IJ SUSP: 80.0000 mg | Freq: Once | INTRAMUSCULAR | Status: DC

## 2017-07-20 NOTE — Assessment & Plan Note (Signed)
Significant exacerbation.  We brought her under control here with nebulizer Xopenex repeated and 30 minutes.  She was given Depo-Medrol and started on another prednisone taper.  With improvement at this visit she was allowed to get the scheduled Nuc[place] injection.  We will need to make a decision this summer as to whether that is worth continuing.

## 2017-07-20 NOTE — Patient Instructions (Signed)
Order- neb xop 0.63     Repeat in 30 minutes   Dx asthma exacerbation             Depo 80  Script sent for prednisone taper  If you get worse- go to ER  Ok to continue your regular meds.  Ok to get [place] today while here

## 2017-07-20 NOTE — Assessment & Plan Note (Signed)
She continues to be compliant with CPAP 9.

## 2017-07-20 NOTE — Progress Notes (Signed)
HPI female never smoker followed for OSA, allergic rhinitis, asthma, complicated by A. Fib/Coumadin, HBP, GERD, obesity NPSG prior to EMR in 2012 Office Spirometry 09/02/16-WNL. FVC 2.06/76%, FEV1 1.57/77%, ratio 0.76, FEF 25-75% 1.26/80% Allergy labs 09/02/16- eosinophils 200, total IgE 4230 causing elevation of all specific allergen antibody levels including Aspergillus. Nucala PFT-10/13/16-minimal obstruction without response to dilator, minimal reduction of diffusion. FVC 2.02/77%, FEV1 1.62/83%, ratio 0.80, TLC 92%, DLCO 77% CT soft tissue neck 09/22/16-1. 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 Subsegmental atelectasis and nonspecific linear patchy densities in the lungs as described. They have a benign appearance.  Small hyperdense masses in the mediastinum are stable compared with 2015 supporting benign etiology such as ectopic thyroid tissue. Three-vessel prominent coronary artery calcification. Bibasilar bronchiolectasis. -------------------------------------------------------------------------------------------  01/18/17- 77 year old female never smoker followed for OSA, allergic rhinitis, asthma complicated by A. Fib/Coumadin, HBP, GERD, obesity CPAP 9/Advanced -OSA; DME: [facility]. Pt wears CPAP nightly and DL attached. No new supplies needed at this time, Pt is concerned about having fungus in her CPAP machine-states she has used [employer] for aobut 1 year now and recently had orange like substance on her hoses,etc. Pt now has drainage in throat and congestion as well.-   neb xopenex 0.63/budesonide, prednisone 10 mg every other day, Anoro Followed by cardiology for intermittent A. fib/A flutter She understands her paroxysmal A. fib impacts her dyspnea on exertion and sense of well-being. CPAP download documents 100% compliance, averaging 6-1/2 hours, AHI 2.3/hour. She is very compliant, recognizing it helps her heart. Recent  minor sore throat and stuffy nose without fever. Better today. Started Nucala injections   07/20/2017-  77 year old female never smoker followed for OSA, allergic rhinitis, asthma/ Nucala, complicated by A. Fib/Coumadin, HBP, GERD, obesity CPAP 9/Advanced ----OSA and Asthma; Pt states she is having a hard time breathing- ? allergies or asthma. Increased cough, wheezing, and SOB-was given pred taper and pred shot as well.  Reports the third episode of wheezing dyspnea at this year.  Current episode began a week ago with nasal congestion, chest tightness, wheeze but no sore throat, purulent sputum or fever.  She had been riding around in a garden with open windows and blames pollen.  Has now been on Nucala for 5 months, well-tolerated, but not sure its effective. Dr. Brigitte Pulse had given prednisone taper, erythromycin a week ago, now finished.  Continues Anoro, Singulair and her nebulizer with DuoNeb/ Pulmicort.  ROS-see HPI   + = pos Constitutional:   No-   weight loss, night sweats, fevers, chills, fatigue, lassitude. HEENT:   No-  headaches, difficulty swallowing, tooth/dental problems, sore throat,       No-  sneezing, itching,  +ear ache,  +nasal congestion, +post nasal drip,  CV:  No-   chest pain, orthopnea, PND, swelling in lower extremities, anasarca, dizziness, palpitations Resp: +  shortness of breath with exertion or at rest.                productive cough,  + non-productive cough,  No- coughing up of blood.              No-change in color of mucus. + wheezing.   Skin: No-   rash or lesions. GI:  No-   heartburn, indigestion, abdominal pain, nausea, vomiting, GU:  MS:  No-   joint pain or swelling. . Neuro-     nothing unusual Psych:  No- change in mood or affect. No depression or anxiety.  No memory loss.   OBJ- Physical Exam General- Alert, Oriented, Affect-appropriate, Distress- none acute, + obese Skin- rash-none, lesions- none, excoriation- none Lymphadenopathy- none Head-  atraumatic            Eyes- Gross vision intact, PERRLA, conjunctivae and secretions clear            Ears- Hearing, canals-normal            Nose- +turbinate edema, no-Septal dev, mucus, polyps, erosion, perforation             Throat- Mallampati III-IV , mucosa clear, not red , drainage- none, tonsils- atrophic Neck- flexible , trachea midline, no stridor , thyroid nl, carotid no bruit Chest - symmetrical excursion , unlabored           Heart/CV- RR (no pacemaker) , no murmur , no gallop  , no rub, nl  s1 s2                  - JVD- none , edema- none, stasis changes- none, varices- none           Lung- , wheeze+ active/labored, winded conversation, cough+                        dullness-none, rub- none           Chest wall-  Abd- Br/ Gen/ Rectal- Not done, not indicated Extrem- cyanosis- none, clubbing, none, atrophy- none, strength- nl Neuro- grossly intact to observation

## 2017-07-21 ENCOUNTER — Telehealth: Payer: Self-pay | Admitting: Internal Medicine

## 2017-07-21 DIAGNOSIS — J4541 Moderate persistent asthma with (acute) exacerbation: Secondary | ICD-10-CM | POA: Diagnosis not present

## 2017-07-21 DIAGNOSIS — J4550 Severe persistent asthma, uncomplicated: Secondary | ICD-10-CM

## 2017-07-21 MED ORDER — MEPOLIZUMAB 100 MG ~~LOC~~ SOLR
100.0000 mg | Freq: Once | SUBCUTANEOUS | Status: AC
Start: 2017-07-20 — End: 2017-07-21
  Administered 2017-07-21: 100 mg via SUBCUTANEOUS

## 2017-07-21 MED ORDER — HYDROCODONE-HOMATROPINE 5-1.5 MG/5ML PO SYRP: 5.0000 mL | ORAL_SOLUTION | Freq: Four times a day (QID) | ORAL | 0 refills | Status: DC | PRN

## 2017-07-21 NOTE — Addendum Note (Signed)
Addended by: Clayborne Dana C on: 07/21/2017 12:36 PM   Modules accepted: Orders

## 2017-07-21 NOTE — Telephone Encounter (Signed)
Spoke with patient. She is aware of the Hycodan refill and new neb machine. Patient wishes to have the RX mailed to her since she lives 1.5 hours away in [place]. Verified address.   Will go ahead and have CY sign RX and place in mail today.

## 2017-07-21 NOTE — Progress Notes (Signed)
Pt seen in office by MD; documented injection on OV note. Blair Hailey

## 2017-07-21 NOTE — Telephone Encounter (Signed)
rx has been signed by CY and placed in outgoing mail.  Nothing further needed.

## 2017-07-21 NOTE — Telephone Encounter (Signed)
Spoke with pt and answered questions regarding pred taper prescribed.  Pt expressed understanding.  While on the phone, pt states she was told that we would be giving her a rx for hycodan, and sending a new rx for a new nebulizer machine.  Per chart this was not done and was not noted on avs to be done.    CY please advise on request for hycodan and neb machine.  Thanks!

## 2017-07-21 NOTE — Telephone Encounter (Signed)
Ok to refill hycodan cough syrup, and to replace old nebulizer machine

## 2017-08-02 DIAGNOSIS — I4891 Unspecified atrial fibrillation: Secondary | ICD-10-CM | POA: Diagnosis not present

## 2017-08-02 DIAGNOSIS — Z6838 Body mass index (BMI) 38.0-38.9, adult: Secondary | ICD-10-CM | POA: Diagnosis not present

## 2017-08-02 DIAGNOSIS — Z299 Encounter for prophylactic measures, unspecified: Secondary | ICD-10-CM | POA: Diagnosis not present

## 2017-08-02 DIAGNOSIS — Z713 Dietary counseling and surveillance: Secondary | ICD-10-CM | POA: Diagnosis not present

## 2017-08-09 ENCOUNTER — Telehealth (HOSPITAL_COMMUNITY): Payer: Self-pay | Admitting: *Deleted

## 2017-08-09 NOTE — Telephone Encounter (Signed)
Patient called in stating she has been in afib since 4/27. She has been dealing with an URI for last few weeks with prednisone taper. Her HR is hanging right around 100 SBP in the 140s. Pt is going to use her 30mg  PRN doses of cardizem for a few days and call back later in the week if still in afib.

## 2017-08-10 DIAGNOSIS — D485 Neoplasm of uncertain behavior of skin: Secondary | ICD-10-CM | POA: Diagnosis not present

## 2017-08-10 DIAGNOSIS — B079 Viral wart, unspecified: Secondary | ICD-10-CM | POA: Diagnosis not present

## 2017-08-10 DIAGNOSIS — B078 Other viral warts: Secondary | ICD-10-CM | POA: Diagnosis not present

## 2017-08-10 DIAGNOSIS — L570 Actinic keratosis: Secondary | ICD-10-CM | POA: Diagnosis not present

## 2017-08-16 ENCOUNTER — Telehealth: Payer: Self-pay | Admitting: Internal Medicine

## 2017-08-16 ENCOUNTER — Other Ambulatory Visit (HOSPITAL_COMMUNITY): Payer: Self-pay | Admitting: *Deleted

## 2017-08-16 DIAGNOSIS — I4819 Other persistent atrial fibrillation: Secondary | ICD-10-CM

## 2017-08-16 MED ORDER — LOSARTAN POTASSIUM 100 MG PO TABS: 100.0000 mg | ORAL_TABLET | Freq: Every day | ORAL | 3 refills | Status: DC

## 2017-08-16 NOTE — Telephone Encounter (Signed)
Patient called back in today - states she continues in afib. She is ready to try tikosyn. She will have a TEE prior to admit to avoid weekly INRs. Pt will stop her flecainide 3 days prior to admit.

## 2017-08-16 NOTE — Telephone Encounter (Signed)
1 Vial Order Date: 5.20.19 Shipping Date: 5.21.19

## 2017-08-17 ENCOUNTER — Telehealth: Payer: Self-pay | Admitting: Pharmacist

## 2017-08-17 NOTE — Telephone Encounter (Signed)
Medication list reviewed in anticipation of upcoming Tikosyn initiation. Patient will need to stop flecainide at least 3 days prior to admission for Tikosyn. Patient is not taking any contraindicated or QTc prolonging medications. Hctz has been discontinued per medication list. Still has RX for prophylactic erythromycin.   Patient is anticoagulated on warfarin and will need to be sure INR therapeutic prior to admission. Does not need weekly INRs prior to procedure since elected for TEE.   Patient will need to be counseled to avoid use of Benadryl while on Tikosyn and in the 2-3 days prior to Tikosyn initiation.

## 2017-08-17 NOTE — Telephone Encounter (Signed)
1 vial Arrival Date:08/17/17 Lot #: [id number] NZUD:[id number]

## 2017-08-20 ENCOUNTER — Ambulatory Visit (INDEPENDENT_AMBULATORY_CARE_PROVIDER_SITE_OTHER): Payer: Medicare Other | Admitting: Internal Medicine

## 2017-08-20 ENCOUNTER — Ambulatory Visit: Payer: Medicare Other

## 2017-08-20 ENCOUNTER — Encounter: Payer: Self-pay | Admitting: Internal Medicine

## 2017-08-20 VITALS — BP 142/82 | HR 85 | Ht 63.5 in | Wt 209.0 lb

## 2017-08-20 DIAGNOSIS — J4541 Moderate persistent asthma with (acute) exacerbation: Secondary | ICD-10-CM

## 2017-08-20 DIAGNOSIS — J4550 Severe persistent asthma, uncomplicated: Secondary | ICD-10-CM

## 2017-08-20 DIAGNOSIS — K219 Gastro-esophageal reflux disease without esophagitis: Secondary | ICD-10-CM

## 2017-08-20 MED ORDER — FLUTICASONE-UMECLIDIN-VILANT 100-62.5-25 MCG/INH IN AEPB: 1.0000 | INHALATION_SPRAY | Freq: Every day | RESPIRATORY_TRACT | 12 refills | Status: DC

## 2017-08-20 NOTE — Progress Notes (Signed)
HPI female never smoker followed for OSA, allergic rhinitis, asthma, complicated by A. Fib/Coumadin, HBP, GERD, obesity NPSG prior to EMR in 2012 Office Spirometry 09/02/16-WNL. FVC 2.06/76%, FEV1 1.57/77%, ratio 0.76, FEF 25-75% 1.26/80% Allergy labs 09/02/16- eosinophils 200, total IgE 4230 causing elevation of all specific allergen antibody levels including Aspergillus. Nucala  + 2018 PFT-10/13/16-minimal obstruction without response to dilator, minimal reduction of diffusion. FVC 2.02/77%, FEV1 1.62/83%, ratio 0.80, TLC 92%, DLCO 77% CT soft tissue neck 09/22/16-1. 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 Subsegmental atelectasis and nonspecific linear patchy densities in the lungs as described. They have a benign appearance.  Small hyperdense masses in the mediastinum are stable compared with 2015 supporting benign etiology such as ectopic thyroid tissue. Three-vessel prominent coronary artery calcification. Bibasilar bronchiolectasis. -------------------------------------------------------------------------------------------   07/20/2017-  77 year old female never smoker followed for OSA, allergic rhinitis, asthma/ Nucala, complicated by A. Fib/Coumadin, HBP, GERD, obesity CPAP 9/Advanced ----OSA and Asthma; Pt states she is having a hard time breathing- ? allergies or asthma. Increased cough, wheezing, and SOB-was given pred taper and pred shot as well.  Reports the third episode of wheezing dyspnea at this year.  Current episode began a week ago with nasal congestion, chest tightness, wheeze but no sore throat, purulent sputum or fever.  She had been riding around in a garden with open windows and blames pollen.  Has now been on Nucala for 5 months, well-tolerated, but not sure its effective. Dr. Brigitte Pulse had given prednisone taper, erythromycin a week ago, now finished.  Continues Anoro, Singulair and her nebulizer with DuoNeb/  Pulmicort.  08/20/2017- 77 year old female never smoker followed for OSA, allergic rhinitis, asthma/ Nucala, complicated by A. Fib/Coumadin, HBP, GERD, obesity CPAP 9/Advanced Nucala  Anoro, Singulair, Neb Duoneb/ Pulmicort  ----Cpap is going well no problems at this time. Coughing is bad right now  somtimes dry and at other times productive.  Coughing had been very much better for some months but recently worse again for unknown reasons.  She admits pollen might be a factor.  Does worse if lying back in recliner.  Occasional scant sputum is usually clear.  Often has no problems when lying down.  Cold air and cold drinks tend to trigger and she always feels the source in her upper trachea or throat area.  Using her nebulizer DuoNeb about twice most days.  She does not recognize reflux or heartburn. Pending another cardioversion.  ROS-see HPI   + = pos Constitutional:   No-   weight loss, night sweats, fevers, chills, fatigue, lassitude. HEENT:   No-  headaches, difficulty swallowing, tooth/dental problems, sore throat,       No-  sneezing, itching,  +ear ache,  +nasal congestion, +post nasal drip,  CV:  No-   chest pain, orthopnea, PND, swelling in lower extremities, anasarca, dizziness, palpitations Resp: +  shortness of breath with exertion or at rest.                productive cough,  + non-productive cough,  No- coughing up of blood.              No-change in color of mucus. + wheezing.   Skin: No-   rash or lesions. GI:  No-   heartburn, indigestion, abdominal pain, nausea, vomiting, GU:  MS:  No-   joint pain or swelling. . Neuro-     nothing unusual Psych:  No- change in mood or affect. No depression or anxiety.  No memory loss.  OBJ- Physical Exam General- Alert, Oriented, Affect-appropriate, Distress- none acute, + obese Skin- rash-none, lesions- none, excoriation- none Lymphadenopathy- none Head- atraumatic            Eyes- Gross vision intact, PERRLA, conjunctivae and  secretions clear            Ears- Hearing, canals-normal            Nose- +turbinate edema, no-Septal dev, mucus, polyps, erosion, perforation             Throat- Mallampati III-IV , mucosa clear, not red , drainage- none, tonsils- atrophic Neck- flexible , trachea midline, no stridor , thyroid nl, carotid no bruit Chest - symmetrical excursion , unlabored           Heart/CV- RR/ very faint (no pacemaker) , no murmur , no gallop  , no rub, nl  s1 s2                  - JVD- none , edema- none, stasis changes- none, varices- none           Lung- , wheeze-none ,cough+                        dullness-none, rub- none           Chest wall-  Abd- Br/ Gen/ Rectal- Not done, not indicated Extrem- cyanosis- none, clubbing, none, atrophy- none, strength- nl Neuro- grossly intact to observation

## 2017-08-20 NOTE — Patient Instructions (Addendum)
Try changing Anoro to sample and print script Trelegy     Inhale 1 puff, then rinse mouth, once daily  Continue to avoid risks for reflux, like lying down right after eating.  Chew and swallow slowly and carefully, so it goes down the right way.   Order- refer to Pulmonary Rehab at [facility]      Dx Asthma, severe persistent

## 2017-08-21 NOTE — Assessment & Plan Note (Signed)
We continue to emphasize the importance of reflux precautions even though she does not feel reflux or heartburn.

## 2017-08-21 NOTE — Assessment & Plan Note (Signed)
Still trying to manage this as cough-predominant asthma but there are characteristics of upper airway cough syndrome and we may want to try gabapentin. I am going to let her try Trelegy first instead of Anoro

## 2017-08-24 ENCOUNTER — Inpatient Hospital Stay (HOSPITAL_COMMUNITY)
Admission: RE | Admit: 2017-08-24 | Discharge: 2017-08-27 | DRG: 310 | Disposition: A | Discharge status: Home / Self Care | Payer: Medicare Other | Attending: Internal Medicine | Admitting: Internal Medicine

## 2017-08-24 ENCOUNTER — Other Ambulatory Visit: Payer: Self-pay

## 2017-08-24 ENCOUNTER — Inpatient Hospital Stay (HOSPITAL_COMMUNITY)
Admission: RE | Admit: 2017-08-24 | Discharge: 2017-08-24 | Disposition: A | Discharge status: Home / Self Care | Payer: Medicare Other | Source: Ambulatory Visit | Attending: Nurse Practitioner | Admitting: Nurse Practitioner

## 2017-08-24 ENCOUNTER — Encounter (HOSPITAL_COMMUNITY): Payer: Self-pay | Admitting: *Deleted

## 2017-08-24 ENCOUNTER — Ambulatory Visit (HOSPITAL_COMMUNITY)
Admission: RE | Admit: 2017-08-24 | Discharge: 2017-08-24 | Disposition: A | Discharge status: Home / Self Care | Payer: Medicare Other | Source: Ambulatory Visit | Attending: Nurse Practitioner | Admitting: Nurse Practitioner

## 2017-08-24 ENCOUNTER — Encounter (HOSPITAL_COMMUNITY): Payer: Self-pay | Admitting: Nurse Practitioner

## 2017-08-24 ENCOUNTER — Encounter (HOSPITAL_COMMUNITY)
Admission: RE | Disposition: A | Discharge status: Home / Self Care | Payer: Self-pay | Source: Home / Self Care | Attending: Internal Medicine

## 2017-08-24 VITALS — BP 110/66 | HR 93 | Ht 63.5 in | Wt 208.6 lb

## 2017-08-24 DIAGNOSIS — I480 Paroxysmal atrial fibrillation: Secondary | ICD-10-CM | POA: Diagnosis present

## 2017-08-24 DIAGNOSIS — Z7951 Long term (current) use of inhaled steroids: Secondary | ICD-10-CM

## 2017-08-24 DIAGNOSIS — F419 Anxiety disorder, unspecified: Secondary | ICD-10-CM | POA: Diagnosis not present

## 2017-08-24 DIAGNOSIS — Z880 Allergy status to penicillin: Secondary | ICD-10-CM

## 2017-08-24 DIAGNOSIS — I4819 Other persistent atrial fibrillation: Secondary | ICD-10-CM

## 2017-08-24 DIAGNOSIS — E669 Obesity, unspecified: Secondary | ICD-10-CM | POA: Diagnosis not present

## 2017-08-24 DIAGNOSIS — F329 Major depressive disorder, single episode, unspecified: Secondary | ICD-10-CM | POA: Diagnosis present

## 2017-08-24 DIAGNOSIS — Z7952 Long term (current) use of systemic steroids: Secondary | ICD-10-CM

## 2017-08-24 DIAGNOSIS — J449 Chronic obstructive pulmonary disease, unspecified: Secondary | ICD-10-CM | POA: Diagnosis not present

## 2017-08-24 DIAGNOSIS — I10 Essential (primary) hypertension: Secondary | ICD-10-CM | POA: Diagnosis not present

## 2017-08-24 DIAGNOSIS — Z8249 Family history of ischemic heart disease and other diseases of the circulatory system: Secondary | ICD-10-CM | POA: Diagnosis not present

## 2017-08-24 DIAGNOSIS — Z79899 Other long term (current) drug therapy: Secondary | ICD-10-CM | POA: Diagnosis not present

## 2017-08-24 DIAGNOSIS — I4892 Unspecified atrial flutter: Secondary | ICD-10-CM | POA: Diagnosis present

## 2017-08-24 DIAGNOSIS — G4733 Obstructive sleep apnea (adult) (pediatric): Secondary | ICD-10-CM | POA: Diagnosis present

## 2017-08-24 DIAGNOSIS — Z5181 Encounter for therapeutic drug level monitoring: Secondary | ICD-10-CM

## 2017-08-24 DIAGNOSIS — Z96641 Presence of right artificial hip joint: Secondary | ICD-10-CM | POA: Diagnosis present

## 2017-08-24 DIAGNOSIS — Z981 Arthrodesis status: Secondary | ICD-10-CM

## 2017-08-24 DIAGNOSIS — E785 Hyperlipidemia, unspecified: Secondary | ICD-10-CM | POA: Diagnosis present

## 2017-08-24 DIAGNOSIS — I481 Persistent atrial fibrillation: Secondary | ICD-10-CM | POA: Diagnosis not present

## 2017-08-24 DIAGNOSIS — I340 Nonrheumatic mitral (valve) insufficiency: Secondary | ICD-10-CM

## 2017-08-24 DIAGNOSIS — Z800 Family history of malignant neoplasm of digestive organs: Secondary | ICD-10-CM | POA: Diagnosis not present

## 2017-08-24 DIAGNOSIS — Z7901 Long term (current) use of anticoagulants: Secondary | ICD-10-CM

## 2017-08-24 DIAGNOSIS — Z888 Allergy status to other drugs, medicaments and biological substances status: Secondary | ICD-10-CM | POA: Diagnosis not present

## 2017-08-24 HISTORY — PX: TEE WITHOUT CARDIOVERSION: SHX5443

## 2017-08-24 LAB — PROTIME-INR
INR: 2.2
Prothrombin Time: 24.2 seconds — ABNORMAL HIGH (ref 11.4–15.2)

## 2017-08-24 LAB — BASIC METABOLIC PANEL
ANION GAP: 9 (ref 5–15)
BUN: 12 mg/dL (ref 6–20)
CHLORIDE: 105 mmol/L (ref 101–111)
CO2: 27 mmol/L (ref 22–32)
Calcium: 9.3 mg/dL (ref 8.9–10.3)
Creatinine, Ser: 0.93 mg/dL (ref 0.44–1.00)
GFR, EST NON AFRICAN AMERICAN: 58 mL/min — AB (ref >60)
Glucose, Bld: 97 mg/dL (ref 65–99)
POTASSIUM: 4 mmol/L (ref 3.5–5.1)
SODIUM: 141 mmol/L (ref 135–145)

## 2017-08-24 LAB — CBC
HEMATOCRIT: 38.5 % (ref 36.0–46.0)
Hemoglobin: 12.2 g/dL (ref 12.0–15.0)
MCH: 27.5 pg (ref 26.0–34.0)
MCHC: 31.7 g/dL (ref 30.0–36.0)
MCV: 86.7 fL (ref 78.0–100.0)
Platelets: 222 10*3/uL (ref 150–400)
RBC: 4.44 MIL/uL (ref 3.87–5.11)
RDW: 15.4 % (ref 11.5–15.5)
WBC: 7.9 10*3/uL (ref 4.0–10.5)

## 2017-08-24 LAB — MAGNESIUM: MAGNESIUM: 1.9 mg/dL (ref 1.7–2.4)

## 2017-08-24 SURGERY — ECHOCARDIOGRAM, TRANSESOPHAGEAL
Anesthesia: Moderate Sedation

## 2017-08-24 MED ORDER — MIDAZOLAM HCL 5 MG/ML IJ SOLN
INTRAMUSCULAR | Status: AC
  Filled 2017-08-24: qty 2

## 2017-08-24 MED ORDER — FENTANYL CITRATE (PF) 100 MCG/2ML IJ SOLN
INTRAMUSCULAR | Status: DC | PRN
  Administered 2017-08-24 (×2): 25 ug via INTRAVENOUS

## 2017-08-24 MED ORDER — POTASSIUM CHLORIDE CRYS ER 20 MEQ PO TBCR
20.0000 meq | EXTENDED_RELEASE_TABLET | Freq: Every day | ORAL | Status: DC
  Administered 2017-08-24 – 2017-08-27 (×4): 20 meq via ORAL
  Filled 2017-08-24 (×4): qty 1

## 2017-08-24 MED ORDER — PANTOPRAZOLE SODIUM 40 MG PO TBEC
40.0000 mg | DELAYED_RELEASE_TABLET | Freq: Every day | ORAL | Status: DC
  Administered 2017-08-25 – 2017-08-27 (×3): 40 mg via ORAL
  Filled 2017-08-24 (×3): qty 1

## 2017-08-24 MED ORDER — UMECLIDINIUM-VILANTEROL 62.5-25 MCG/INH IN AEPB
1.0000 | INHALATION_SPRAY | Freq: Every day | RESPIRATORY_TRACT | Status: DC
Start: 2017-08-25 — End: 2017-08-27
  Administered 2017-08-25 – 2017-08-27 (×3): 1 via RESPIRATORY_TRACT
  Filled 2017-08-24: qty 14

## 2017-08-24 MED ORDER — MONTELUKAST SODIUM 10 MG PO TABS
10.0000 mg | ORAL_TABLET | Freq: Every day | ORAL | Status: DC
  Administered 2017-08-24 – 2017-08-26 (×3): 10 mg via ORAL
  Filled 2017-08-24 (×3): qty 1

## 2017-08-24 MED ORDER — MEPOLIZUMAB 100 MG ~~LOC~~ SOLR
100.0000 mg | SUBCUTANEOUS | Status: DC
  Administered 2017-08-20: 100 mg via SUBCUTANEOUS

## 2017-08-24 MED ORDER — DOFETILIDE 500 MCG PO CAPS
500.0000 ug | ORAL_CAPSULE | Freq: Two times a day (BID) | ORAL | Status: DC
  Administered 2017-08-24 – 2017-08-27 (×6): 500 ug via ORAL
  Filled 2017-08-24 (×6): qty 1

## 2017-08-24 MED ORDER — SODIUM CHLORIDE 0.9% FLUSH
3.0000 mL | INTRAVENOUS | Status: DC | PRN
Start: 2017-08-24 — End: 2017-08-27

## 2017-08-24 MED ORDER — WARFARIN SODIUM 5 MG PO TABS
5.0000 mg | ORAL_TABLET | Freq: Once | ORAL | Status: AC
Start: 2017-08-24 — End: 2017-08-24
  Administered 2017-08-24: 5 mg via ORAL
  Filled 2017-08-24: qty 1

## 2017-08-24 MED ORDER — UMECLIDINIUM-VILANTEROL 62.5-25 MCG/INH IN AEPB
1.0000 | INHALATION_SPRAY | Freq: Every day | RESPIRATORY_TRACT | Status: DC
  Filled 2017-08-24: qty 14

## 2017-08-24 MED ORDER — DILTIAZEM HCL ER COATED BEADS 120 MG PO CP24
120.0000 mg | ORAL_CAPSULE | Freq: Every day | ORAL | Status: DC
  Administered 2017-08-24 – 2017-08-26 (×3): 120 mg via ORAL
  Filled 2017-08-24 (×3): qty 1

## 2017-08-24 MED ORDER — CYCLOSPORINE 0.05 % OP EMUL
1.0000 [drp] | Freq: Two times a day (BID) | OPHTHALMIC | Status: DC
  Administered 2017-08-24 – 2017-08-27 (×7): 1 [drp] via OPHTHALMIC
  Filled 2017-08-24 (×7): qty 1

## 2017-08-24 MED ORDER — SODIUM CHLORIDE 0.9% FLUSH
3.0000 mL | Freq: Two times a day (BID) | INTRAVENOUS | Status: DC
  Administered 2017-08-25 – 2017-08-27 (×3): 3 mL via INTRAVENOUS

## 2017-08-24 MED ORDER — FENTANYL CITRATE (PF) 100 MCG/2ML IJ SOLN
INTRAMUSCULAR | Status: AC
  Filled 2017-08-24: qty 2

## 2017-08-24 MED ORDER — BUDESONIDE 0.5 MG/2ML IN SUSP
0.5000 mg | Freq: Two times a day (BID) | RESPIRATORY_TRACT | Status: DC
  Administered 2017-08-24 – 2017-08-27 (×6): 0.5 mg via RESPIRATORY_TRACT
  Filled 2017-08-24 (×6): qty 2

## 2017-08-24 MED ORDER — FLUTICASONE PROPIONATE 50 MCG/ACT NA SUSP
1.0000 | Freq: Two times a day (BID) | NASAL | Status: DC
  Administered 2017-08-24 – 2017-08-27 (×6): 1 via NASAL
  Filled 2017-08-24 (×2): qty 16

## 2017-08-24 MED ORDER — MIDAZOLAM HCL 10 MG/2ML IJ SOLN
INTRAMUSCULAR | Status: DC | PRN
  Administered 2017-08-24 (×2): 2 mg via INTRAVENOUS
  Administered 2017-08-24: 1 mg via INTRAVENOUS

## 2017-08-24 MED ORDER — ATORVASTATIN CALCIUM 10 MG PO TABS
10.0000 mg | ORAL_TABLET | Freq: Every evening | ORAL | Status: DC
  Administered 2017-08-24 – 2017-08-26 (×3): 10 mg via ORAL
  Filled 2017-08-24 (×3): qty 1

## 2017-08-24 MED ORDER — BENEFIBER ON THE GO PO POWD: 1.0000 | Freq: Every day | ORAL | Status: DC | PRN

## 2017-08-24 MED ORDER — SODIUM CHLORIDE 0.9 % IV SOLN
250.0000 mL | INTRAVENOUS | Status: DC | PRN
Start: 2017-08-24 — End: 2017-08-27

## 2017-08-24 MED ORDER — WARFARIN - PHARMACIST DOSING INPATIENT
Freq: Every day | Status: DC
Start: 2017-08-24 — End: 2017-08-27
  Administered 2017-08-24 – 2017-08-25 (×2)

## 2017-08-24 MED ORDER — BUDESONIDE 0.5 MG/2ML IN SUSP: 0.5000 mg | Freq: Two times a day (BID) | RESPIRATORY_TRACT | Status: DC

## 2017-08-24 MED ORDER — BIOTIN 5000 MCG PO CAPS: 5000.0000 ug | ORAL_CAPSULE | Freq: Every day | ORAL | Status: DC

## 2017-08-24 MED ORDER — SODIUM CHLORIDE 0.9 % IV SOLN: INTRAVENOUS | Status: DC

## 2017-08-24 MED ORDER — ACETAMINOPHEN 500 MG PO TABS: 500.0000 mg | ORAL_TABLET | Freq: Every day | ORAL | Status: DC | PRN

## 2017-08-24 MED ORDER — BUTAMBEN-TETRACAINE-BENZOCAINE 2-2-14 % EX AERO
INHALATION_SPRAY | CUTANEOUS | Status: DC | PRN
  Administered 2017-08-24: 2 via TOPICAL

## 2017-08-24 MED ORDER — LOSARTAN POTASSIUM 50 MG PO TABS
100.0000 mg | ORAL_TABLET | Freq: Every day | ORAL | Status: DC
  Administered 2017-08-25 – 2017-08-27 (×3): 100 mg via ORAL
  Filled 2017-08-24 (×3): qty 2

## 2017-08-24 NOTE — CV Procedure (Signed)
During this procedure the patient is administered a total of Versed 5 mg and Fentanyl 50 mg to achieve and maintain moderate conscious sedation.  The patient's heart rate, blood pressure, and oxygen saturation are monitored continuously during the procedure. The period of conscious sedation is 30 minutes, of which I was present face-to-face 100% of this time.  EF 60% LAE No LAA thrombus Normal RV/RA No ASD/PFO Normal AV Mild MR No significant aortic debris Normal aortic root  3D rendering of atrial septum and MV performed  Ok to admit for Tikosyn loading  [employer]

## 2017-08-24 NOTE — Addendum Note (Signed)
Addended by: Desmond Dike C on: 08/24/2017 08:52 AM   Modules accepted: Orders

## 2017-08-24 NOTE — H&P (Addendum)
Cardiology Admission History and Physical:   Patient ID: [name] [name]; MRN: [id number]; DOB: 1940-08-04   Admission date: 08/24/2017  Primary Care Provider: Monico Blitz, MD Primary Cardiologist: Dr. Domenic Polite Primary Electrophysiologist:  Dr. Rayann Heman  Chief Complaint:  Tikosyn initiation  Patient Profile:   [name] [name] is a 77 y.o. female with a history of persistent AFib, AFlutter, COPD, HTN, OSA w/CPAP, hx of bradycardia requiring down-titration of meds, being admitted for Tikosyn initiation  History of Present Illness:   [name] was on Flecainide for AAD, her last dose Friday of last week, she has been 3 full days off.  Her medicine list has been reviewed by [facility], recommended to stop flecainide for 3 days (done), and not taking HCTZ, mentioning erythromycin for dental prophylaxis.  TEE was done today without thrombus, INR 2.20  Reviewed Tikosyn protocol, benefits/potential risks of Tikosyn, she would like to proceed, also discussed dccv Thursday if not in SR, she is agreeable     Past Medical History:  Diagnosis Date  . Allergic rhinitis   . Anxiety   . Atrial flutter ([place])   . COPD (chronic obstructive pulmonary disease) ([place])   . Depression   . Essential hypertension   . Hyperlipidemia   . Lumbar disc disease   . Obesity   . OSA (obstructive sleep apnea)    CPAP  . Paroxysmal atrial fibrillation (H[facility])    Element of tachycardia bradycardia syndrome  . Respiratory failure [facility])     Past Surgical History:  Procedure Laterality Date  . ANTERIOR FUSION LUMBAR SPINE    . BIOPSY  08/27/2016   Procedure: BIOPSY;  Surgeon: Rogene Houston, MD;  Location: [facility] ENDO SUITE;  Service: Endoscopy;;  FOUR GASTRIC POLYPS BIOPSIED  . CARDIOVERSION N/A 03/28/2014   Procedure: CARDIOVERSION;  Surgeon: Fay Records, MD;  Location: [facility] ORS;  Service: Cardiovascular;  Laterality: N/A;  . CARDIOVERSION N/A 10/22/2016   Procedure: CARDIOVERSION;  Surgeon:  Sueanne Margarita, MD;  Location: [facility] ENDOSCOPY;  Service: Cardiovascular;  Laterality: N/A;  . CATARACT EXTRACTION    . COLONOSCOPY N/A 08/04/2012   Procedure: COLONOSCOPY;  Surgeon: Rogene Houston, MD;  Location: [facility];  Service: Endoscopy;  Laterality: N/A;  730-rescheduled to [place] notified pt  . ELECTROPHYSIOLOGIC STUDY N/A 09/18/2014   Procedure: Atrial Fibrillation Ablation;  Surgeon: Thompson Grayer, MD;  Location: [facility] CV LAB;  Service: Cardiovascular;  Laterality: N/A;  . ESOPHAGOGASTRODUODENOSCOPY N/A 08/27/2016   Procedure: ESOPHAGOGASTRODUODENOSCOPY (EGD);  Surgeon: Rogene Houston, MD;  [facility];  Service: Endoscopy;  Laterality: N/A;  1200  . LASIK     Both eyes  . TEE WITHOUT CARDIOVERSION N/A 09/18/2014   Procedure: TRANSESOPHAGEAL ECHOCARDIOGRAM (TEE);  Surgeon: Larey Dresser, MD;  Location: [place];  Service: Cardiovascular;  Laterality: N/A;  . TEE WITHOUT CARDIOVERSION N/A 10/22/2016   Procedure: TRANSESOPHAGEAL ECHOCARDIOGRAM (TEE);  Surgeon: Sueanne Margarita, MD;  Location: [facility] ENDOSCOPY;  Service: Cardiovascular;  Laterality: N/A;  . TOTAL HIP ARTHROPLASTY  2010   Right     Medications Prior to Admission: Prior to Admission medications   Medication Sig Start Date End Date Taking? Authorizing Provider  acetaminophen (TYLENOL) 500 MG tablet Take 500 mg by mouth daily as needed for headache.   Yes [provider]  antiseptic oral rinse (BIOTENE) LIQD 15 mLs by Mouth Rinse route as needed for dry mouth.   Yes [provider]  atorvastatin (LIPITOR) 10 MG  tablet Take 10 mg by mouth every evening.    Yes [provider]  baclofen (LIORESAL) 10 MG tablet Take 10 mg by mouth as needed for muscle spasms.   Yes [provider]  Biotin 5000 MCG CAPS Take 5,000 mcg by mouth daily.   Yes [provider]  budesonide (PULMICORT) 0.5 MG/2ML nebulizer solution Take 0.5 mg by nebulization 2 (two) times daily.   08/13/11  Yes Young, Tarri Fuller D, MD  cetirizine (ZYRTEC) 10 MG tablet Take 10 mg by mouth daily as needed for allergies.   Yes [provider]  cycloSPORINE (RESTASIS) 0.05 % ophthalmic emulsion Place 1 drop 2 (two) times daily into both eyes.    Yes [provider]  diltiazem (CARTIA XT) 120 MG 24 hr capsule Take 120 mg by mouth at bedtime.    Yes [provider]  erythromycin ethylsuccinate (EES) 400 MG tablet Take 1,600 mg by mouth as directed. 1 hour prior to dental procedures   Yes [provider]  fluticasone (FLONASE) 50 MCG/ACT nasal spray Place 1 spray into both nostrils 2 (two) times daily.   Yes [provider]  Fluticasone-Umeclidin-Vilant (TRELEGY ELLIPTA) 100-62.5-25 MCG/INH AEPB Inhale 1 puff into the lungs daily. Then rinse mouth, once daily 08/20/17  Yes Young, Clinton D, MD  Hypromellose (ARTIFICIAL TEARS OP) Apply 1 drop to eye every 4 (four) hours as needed (dry eyes).    Yes [provider]  ipratropium (ATROVENT) 0.06 % nasal spray Place 2 sprays into both nostrils 4 (four) times daily as needed for rhinitis.    Yes [provider]  ipratropium-albuterol (DUONEB) 0.5-2.5 (3) MG/3ML SOLN Take 3 mLs by nebulization 2 (two) times daily.    Yes [provider]  losartan (COZAAR) 100 MG tablet Take 1 tablet (100 mg total) by mouth daily. 08/16/17 11/14/17 Yes Sherran Needs, NP  Mepolizumab (NUCALA [place]) Inject into the skin every 30 (thirty) days.   Yes [provider]  montelukast (SINGULAIR) 10 MG tablet Take 10 mg by mouth at bedtime.   Yes [provider]  omeprazole (PRILOSEC) 20 MG capsule Take 20 mg by mouth daily.   Yes [provider]  omeprazole (PRILOSEC) 20 MG capsule Take 20 mg by mouth daily.   Yes [provider]  potassium chloride SA (K-DUR,KLOR-CON) 20 MEQ tablet Take 1 tablet (20 mEq total) daily by mouth. 02/10/17  Yes Sherran Needs, NP  sodium chloride  (OCEAN) 0.65 % SOLN nasal spray Place 1 spray into both nostrils 2 (two) times daily.    Yes [provider]  triamcinolone ointment (KENALOG) 0.1 % Apply 1 application topically daily as needed for rash. 06/01/17  Yes [provider]  umeclidinium-vilanterol (ANORO ELLIPTA) 62.5-25 MCG/INH AEPB Inhale 1 puff into the lungs daily. 07/20/17  Yes Young, Tarri Fuller D, MD  warfarin (COUMADIN) 5 MG tablet Take 1 tablet (5 mg total) by mouth daily. Patient taking differently: Take 2.5-5 mg by mouth every evening. Take 2.5 mg daily on Mon and Fri, then take 5mg s daily on Tues, Wed, Thur, Sat, and Sun 12/19/14  Yes Okema Rollinson, Jeneen Rinks, MD  Wheat Dextrin (BENEFIBER ON THE GO) POWD Take 1 Dose by mouth daily as needed (constipation).    Yes [provider]  ondansetron (ZOFRAN) 4 MG tablet Take 4 mg by mouth daily as needed for nausea or vomiting.    [provider]     Allergies:    Allergies  Allergen Reactions  . Bupropion  Hcl Other (See Comments)    Suicidal Thoughts.   . Escitalopram Oxalate Other (See Comments)    Suicidal Thoughts.   . Fluticasone-Salmeterol Other (See Comments)    Advair - Caused patient to go into Afib.   Marland Kitchen Lisinopril Cough  . Serevent Other (See Comments)    Caused patient to go into Afib  . Macrodantin [Nitrofurantoin] Rash  . Penicillins Rash and Other (See Comments)    Childhood allergy  Has patient had a PCN reaction causing immediate rash, facial/tongue/throat swelling, SOB or lightheadedness with hypotension: Unknown Has patient had a PCN reaction causing severe rash involving mucus membranes or skin necrosis: Unknown Has patient had a PCN reaction that required hospitalization No Has patient had a PCN reaction occurring within the last 10 years: No If all of the above answers are "NO", then may proceed with Cephalosporin use.     Social History:   Social History   Socioeconomic History  . Marital status: Single    Spouse name: Not on  file  . Number of children: Not on file  . Years of education: Not on file  . Highest education level: Not on file  Occupational History  . Occupation: Retired: Presenter, broadcasting  Social Needs  . Financial resource strain: Not on file  . Food insecurity:    Worry: Not on file    Inability: Not on file  . Transportation needs:    Medical: Not on file    Non-medical: Not on file  Tobacco Use  . Smoking status: Never Smoker  . Smokeless tobacco: Never Used  Substance and Sexual Activity  . Alcohol use: No    Alcohol/week: 0.0 oz  . Drug use: No  . Sexual activity: Not on file  Lifestyle  . Physical activity:    Days per week: Not on file    Minutes per session: Not on file  . Stress: Not on file  Relationships  . Social connections:    Talks on phone: Not on file    Gets together: Not on file    Attends religious service: Not on file    Active member of club or organization: Not on file    Attends meetings of clubs or organizations: Not on file    Relationship status: Not on file  . Intimate partner violence:    Fear of current or ex partner: Not on file    Emotionally abused: Not on file    Physically abused: Not on file    Forced sexual activity: Not on file  Other Topics Concern  . Not on file  Social History Narrative   Pt lives in [place] alone. She was never married.   Retired Customer service manager.   Attends [employer]    Family History:   The patient's family history includes Cancer in her mother; Colon cancer in her other; Heart attack in her father.    ROS:  Please see the history of present illness.  All other ROS reviewed and negative.     Physical Exam/Data:   Vitals:   08/24/17 1210 08/24/17 1220 08/24/17 1243 08/24/17 1244  BP: 133/87 126/77  (!) 141/99  Pulse: (!) 107 94  92  Resp: (!) 21 20    Temp:    97.9 F (36.6 C)  TempSrc:    Oral  SpO2: 95% 95%  99%  Weight:   205 lb 8 oz (93.2 kg)   Height:   5' 3.5" (1.613 m)  Intake/Output Summary (Last 24 hours) at 08/24/2017 1303 Last data filed at 08/24/2017 1244 Gross per 24 hour  Intake 0 ml  Output -  Net 0 ml   Filed Weights   08/24/17 1243  Weight: 205 lb 8 oz (93.2 kg)   Body mass index is 35.83 kg/m.  General:  Well nourished, well developed, in no acute distress HEENT: normal Lymph: no adenopathy Neck: no JVD Endocrine:  No thryomegaly Vascular: No carotid bruits  Cardiac:  iRRR; no murmurs, gallops or rubs Lungs:  CTA b/l, no wheezing, rhonchi or rales  Abd: soft, nontender, no hepatomegaly  Ext: no edema Musculoskeletal:  No deformities, BUE and BLE strength normal and equal Skin: warm and dry  Neuro:  no focal abnormalities noted Psych:  Normal affect    EKG:  The ECG that was done today was personally reviewed and demonstrates AFib 93bpm, QTc 447ms  Relevant CV Studies:  08/24/17: TEE EF 60% LAE No LAA thrombus Normal RV/RA No ASD/PFO Normal AV Mild MR No significant aortic debris Normal aortic root  3D rendering of atrial septum and MV performed  Ok to admit for Tikosyn loading    Laboratory Data:  Chemistry Recent Labs  Lab 08/24/17 0812  NA 141  K 4.0  CL 105  CO2 27  GLUCOSE 97  BUN 12  CREATININE 0.93  CALCIUM 9.3  GFRNONAA 58*  GFRAA >60  ANIONGAP 9    No results for input(s): PROT, ALBUMIN, AST, ALT, ALKPHOS, BILITOT in the last 168 hours. Hematology Recent Labs  Lab 08/24/17 0812  WBC 7.9  RBC 4.44  HGB 12.2  HCT 38.5  MCV 86.7  MCH 27.5  MCHC 31.7  RDW 15.4  PLT 222   Cardiac EnzymesNo results for input(s): TROPONINI in the last 168 hours. No results for input(s): TROPIPOC in the last 168 hours.  BNPNo results for input(s): BNP, PROBNP in the last 168 hours.  DDimer No results for input(s): DDIMER in the last 168 hours.  Radiology/Studies:  No results found.  Assessment and Plan:   1. Persistent AFib, here for Tikosyn initiation     CHA2DS2Vasc is 4, on warfarin      K+ 4.0     Mag 1.9 (will give single dose), OK to start     Creat 0.93 (Calc CrCl is 74)     INR 2.20, pharmacy to manage     QTc SR EKG's 420's, is OK to proceed  Plan to start 519mcg BID Plan DCCV Thursday if not in SR  Reviewed med list, she is not actively taking zofran and instructed not to use, is being discontinued, discussed with RPH here, will stop her PRN erythromycin, recommends patient discuss with her PMD an alternative to this for her dental prophylaxis (she has not used recently/a couple months), discussed with the patient this, she states she will give them a cal while here  2. OSA      order CPAP for here  3. HTN     Resume home meds  4. Allregies/asthma     Went through med list with the patient She is not actively using a number of her PRN meds, asked they not be re-ordered while here  For questions or updates, please contact [place] Please consult [email or web address] for contact info under Cardiology/STEMI.    Signed, Baldwin Jamaica, PA-C  08/24/2017 1:03 PM    I have seen, examined the patient, and reviewed the above assessment and plan.  Changes to  above are made where necessary.  On exam, tachycardic irregular rhythm.  Pt with refractory afib.  Unfortunately, has not been diligent with lifestyle changes. I think that Phyllis Ginger is the appropriate next step.  Will therefore admit for initiation of tikosyn.  If she does not convert to sinus, will require cardioversion later in the week.  Co Sign: Thompson Grayer, MD 08/24/2017 10:20 PM

## 2017-08-24 NOTE — Interval H&P Note (Signed)
History and Physical Interval Note:  08/24/2017 10:37 AM  [name] [name]  has presented today for surgery, with the diagnosis of A-FIB  The various methods of treatment have been discussed with the patient and family. After consideration of risks, benefits and other options for treatment, the patient has consented to  Procedure(s): TRANSESOPHAGEAL ECHOCARDIOGRAM (TEE) (N/A) as a surgical intervention .  The patient's history has been reviewed, patient examined, no change in status, stable for surgery.  I have reviewed the patient's chart and labs.  Questions were answered to the patient's satisfaction.     Jenkins Rouge

## 2017-08-24 NOTE — Progress Notes (Signed)
Documentation of medication administration and charges of [place] have been completed by Elan Mcelvain, CMA based on the [place] documentation sheet completed by Tammy Scott.   

## 2017-08-24 NOTE — Progress Notes (Signed)
Primary Care Physician: Monico Blitz, MD Referring Physician: Dr. [name] is a 77 y.o. female with a h/o COPD, HTN, OSA, atrial flutter and paroxysmal afib tht has been persistent for several weeks. She is in the afib clinic for admission for Tikosyn. She has now failed flecainide, many cardioversion's in the past, has been off drug since Friday. She has not had the 4 weekly therapaeutic INR's and will have an INR and if in range TEE prior to admission. No benadryl use. She is no longer on HCTZ.   Today, she denies symptoms of palpitations, chest pain, shortness of breath, orthopnea, PND, lower extremity edema, dizziness, presyncope, syncope, or neurologic sequela. The patient is tolerating medications without difficulties and is otherwise without complaint today.   Past Medical History:  Diagnosis Date  . Allergic rhinitis   . Anxiety   . Atrial flutter ([place])   . COPD (chronic obstructive pulmonary disease) ([place])   . Depression   . Essential hypertension   . Hyperlipidemia   . Lumbar disc disease   . Obesity   . OSA (obstructive sleep apnea)    CPAP  . Paroxysmal atrial fibrillation ([facility])    Element of tachycardia bradycardia syndrome  . Respiratory failure [facility])    Past Surgical History:  Procedure Laterality Date  . ANTERIOR FUSION LUMBAR SPINE    . BIOPSY  08/27/2016   Procedure: BIOPSY;  Surgeon: Rogene Houston, MD;  Location: [facility] ENDO SUITE;  Service: Endoscopy;;  FOUR GASTRIC POLYPS BIOPSIED  . CARDIOVERSION N/A 03/28/2014   Procedure: CARDIOVERSION;  Surgeon: Fay Records, MD;  Location: [facility] ORS;  Service: Cardiovascular;  Laterality: N/A;  . CARDIOVERSION N/A 10/22/2016   Procedure: CARDIOVERSION;  Surgeon: Sueanne Margarita, MD;  Location[facility] ENDOSCOPY;  Service: Cardiovascular;  Laterality: N/A;  . CATARACT EXTRACTION    . COLONOSCOPY N/A 08/04/2012   Procedure: COLONOSCOPY;  Surgeon: Rogene Houston, MD;  Location: [facility];  Service: Endoscopy;   Laterality: N/A;  730-rescheduled to [place] notified pt  . ELECTROPHYSIOLOGIC STUDY N/A 09/18/2014   Procedure: Atrial Fibrillation Ablation;  Surgeon: Thompson Grayer, MD;  Location: [facility] LAB;  Service: Cardiovascular;  Laterality: N/A;  . ESOPHAGOGASTRODUODENOSCOPY N/A 08/27/2016   Procedure: ESOPHAGOGASTRODUODENOSCOPY (EGD);  Surgeon: Rogene Houston, MD;  Location: [facility];  Service: Endoscopy;  Laterality: N/A;  1200  . LASIK     Both eyes  . TEE WITHOUT CARDIOVERSION N/A 09/18/2014   Procedure: TRANSESOPHAGEAL ECHOCARDIOGRAM (TEE);  Surgeon: Larey Dresser, MD;  Location: [place];  Service: Cardiovascular;  Laterality: N/A;  . TEE WITHOUT CARDIOVERSION N/A 10/22/2016   Procedure: TRANSESOPHAGEAL ECHOCARDIOGRAM (TEE);  Surgeon: Sueanne Margarita, MD;  Location: [facility] ENDOSCOPY;  Service: Cardiovascular;  Laterality: N/A;  . TOTAL HIP ARTHROPLASTY  2010   Right    Current Outpatient Medications  Medication Sig Dispense Refill  . acetaminophen (TYLENOL) 500 MG tablet Take 500 mg by mouth daily as needed for headache.    Marland Kitchen antiseptic oral rinse (BIOTENE) LIQD 15 mLs by Mouth Rinse route as needed for dry mouth.    Marland Kitchen atorvastatin (LIPITOR) 10 MG tablet Take 10 mg by mouth every evening.     . baclofen (LIORESAL) 10 MG tablet Take 10 mg by mouth as needed for muscle spasms.    . Biotin 5000 MCG CAPS Take 5,000 mcg by mouth daily.    . budesonide (PULMICORT) 0.5 MG/2ML nebulizer solution Take 0.5 mg by  nebulization 2 (two) times daily.     . cetirizine (ZYRTEC) 10 MG tablet Take 10 mg by mouth daily as needed for allergies.    . cycloSPORINE (RESTASIS) 0.05 % ophthalmic emulsion Place 1 drop 2 (two) times daily into both eyes.     Marland Kitchen diltiazem (CARTIA XT) 120 MG 24 hr capsule Take 120 mg by mouth at bedtime.     Marland Kitchen erythromycin ethylsuccinate (EES) 400 MG tablet Take 1,600 mg by mouth as directed. 1 hour prior to dental procedures    . fluticasone (FLONASE) 50 MCG/ACT nasal  spray Place 1 spray into both nostrils 2 (two) times daily.    . Hypromellose (ARTIFICIAL TEARS OP) Apply 1 drop to eye every 4 (four) hours as needed (dry eyes).     Marland Kitchen losartan (COZAAR) 100 MG tablet Take 1 tablet (100 mg total) by mouth daily. 30 tablet 3  . Mepolizumab (NUCALA [place]) Inject into the skin every 30 (thirty) days.    . montelukast (SINGULAIR) 10 MG tablet Take 10 mg by mouth at bedtime.    Marland Kitchen omeprazole (PRILOSEC) 20 MG capsule Take 20 mg by mouth daily.    Marland Kitchen omeprazole (PRILOSEC) 20 MG capsule Take 20 mg by mouth daily.    . potassium chloride SA (K-DUR,KLOR-CON) 20 MEQ tablet Take 1 tablet (20 mEq total) daily by mouth. 30 tablet 6  . sodium chloride (OCEAN) 0.65 % SOLN nasal spray Place 1 spray into both nostrils 2 (two) times daily.     Marland Kitchen triamcinolone ointment (KENALOG) 0.1 % Apply 1 application topically daily as needed for rash.  2  . umeclidinium-vilanterol (ANORO ELLIPTA) 62.5-25 MCG/INH AEPB Inhale 1 puff into the lungs daily. 2 each 0  . warfarin (COUMADIN) 5 MG tablet Take 1 tablet (5 mg total) by mouth daily. (Patient taking differently: Take 2.5-5 mg by mouth every evening. Take 2.5 mg daily on Mon and Fri, then take 5mg s daily on Tues, Wed, Thur, Sat, and Sun) 45 tablet 3  . Wheat Dextrin (BENEFIBER ON THE GO) POWD Take 1 Dose by mouth daily as needed (constipation).     . Fluticasone-Umeclidin-Vilant (TRELEGY ELLIPTA) 100-62.5-25 MCG/INH AEPB Inhale 1 puff into the lungs daily. Then rinse mouth, once daily (Patient not taking: Reported on 08/24/2017) 60 each 12  . ipratropium (ATROVENT) 0.06 % nasal spray Place 2 sprays into both nostrils 4 (four) times daily as needed for rhinitis.     Marland Kitchen ipratropium-albuterol (DUONEB) 0.5-2.5 (3) MG/3ML SOLN Take 3 mLs by nebulization 2 (two) times daily.     . ondansetron (ZOFRAN) 4 MG tablet Take 4 mg by mouth daily as needed for nausea or vomiting.     Current Facility-Administered Medications  Medication Dose Route Frequency  Provider Last Rate Last Dose  . Mepolizumab SOLR 100 mg  100 mg Subcutaneous Q28 days Baird Lyons D, MD   100 mg at 08/20/17 0850    Allergies  Allergen Reactions  . Bupropion Hcl Other (See Comments)    Suicidal Thoughts.   . Escitalopram Oxalate Other (See Comments)    Suicidal Thoughts.   . Fluticasone-Salmeterol Other (See Comments)    Advair - Caused patient to go into Afib.   Marland Kitchen Lisinopril Cough  . Serevent Other (See Comments)    Caused patient to go into Afib  . Macrodantin [Nitrofurantoin] Rash  . Penicillins Rash and Other (See Comments)    Childhood allergy  Has patient had a PCN reaction causing immediate rash, facial/tongue/throat swelling, SOB or lightheadedness  with hypotension: Unknown Has patient had a PCN reaction causing severe rash involving mucus membranes or skin necrosis: Unknown Has patient had a PCN reaction that required hospitalization No Has patient had a PCN reaction occurring within the last 10 years: No If all of the above answers are "NO", then may proceed with Cephalosporin use.     Social History   Socioeconomic History  . Marital status: Single    Spouse name: Not on file  . Number of children: Not on file  . Years of education: Not on file  . Highest education level: Not on file  Occupational History  . Occupation: Retired: Presenter, broadcasting  Social Needs  . Financial resource strain: Not on file  . Food insecurity:    Worry: Not on file    Inability: Not on file  . Transportation needs:    Medical: Not on file    Non-medical: Not on file  Tobacco Use  . Smoking status: Never Smoker  . Smokeless tobacco: Never Used  Substance and Sexual Activity  . Alcohol use: No    Alcohol/week: 0.0 oz  . Drug use: No  . Sexual activity: Not on file  Lifestyle  . Physical activity:    Days per week: Not on file    Minutes per session: Not on file  . Stress: Not on file  Relationships  . Social connections:    Talks on phone: Not on file     Gets together: Not on file    Attends religious service: Not on file    Active member of club or organization: Not on file    Attends meetings of clubs or organizations: Not on file    Relationship status: Not on file  . Intimate partner violence:    Fear of current or ex partner: Not on file    Emotionally abused: Not on file    Physically abused: Not on file    Forced sexual activity: Not on file  Other Topics Concern  . Not on file  Social History Narrative   Pt lives in [place] alone. She was never married.   Retired Customer service manager.   Attends [employer]    Family History  Problem Relation Age of Onset  . Cancer Mother   . Heart attack Father   . Colon cancer Other     ROS- All systems are reviewed and negative except as per the HPI above  Physical Exam: Vitals:   08/24/17 0830  BP: 110/66  Pulse: 93  Weight: 208 lb 9.6 oz (94.6 kg)  Height: 5' 3.5" (1.613 m)   Wt Readings from Last 3 Encounters:  08/24/17 208 lb 9.6 oz (94.6 kg)  08/20/17 209 lb (94.8 kg)  07/20/17 206 lb (93.4 kg)    Labs: Lab Results  Component Value Date   NA 137 02/10/2017   K 3.2 (L) 02/10/2017   CL 105 02/10/2017   CO2 23 02/10/2017   GLUCOSE 123 (H) 02/10/2017   BUN 16 02/10/2017   CREATININE 1.06 (H) 02/10/2017   CALCIUM 9.0 02/10/2017   Lab Results  Component Value Date   INR 2.30 02/10/2017   No results found for: CHOL, HDL, LDLCALC, TRIG   GEN- The patient is well appearing, alert and oriented x 3 today.   Head- normocephalic, atraumatic Eyes-  Sclera clear, conjunctiva pink Ears- hearing intact Oropharynx- clear Neck- supple, no JVP Lymph- no cervical lymphadenopathy Lungs- Clear to ausculation bilaterally, normal work of breathing Heart- irregular  rate and rhythm, no murmurs, rubs or gallops, PMI not laterally displaced GI- soft, NT, ND, + BS Extremities- no clubbing, cyanosis, or edema MS- no significant deformity or atrophy Skin- no rash or  lesion Psych- euthymic mood, full affect Neuro- strength and sensation are intact  EKG- atrial fibrillation at 93 bpm, qrs int 90 ms, qtc 450 ms    Assessment and Plan: 1. Persistent  afib Has failed flecainide Off drug since Friday No benadryl  INR this am therapeutic at 2.20, and K+ at 4.0, and mag at 1.9 Crcl cal at 75.45 She will proceed for TEE and if no thrombus, proceed with admission for tikosyn admission  She believes  that she  will quality for drug assistance and forms have been given to pt to fill out during admission and we will fax in on day of discharge when her exact diose of dofetilide is known qtc 450 ms Drugs screened by pharmD, Zofran is on list.. She  has not taken and was told she could not take drug with dofetilide  Butch Penny C. Terris Germano, [facility] [address] [place], [place] [address] [phone]

## 2017-08-24 NOTE — Progress Notes (Signed)
  Echocardiogram Echocardiogram Transesophageal has been performed.  [name] [name] [name] [name] 08/24/2017, 12:07 PM

## 2017-08-24 NOTE — Progress Notes (Signed)
Pharmacy Review for Dofetilide (Tikosyn) Initiation  Admit Complaint: 77 y.o. female admitted 08/24/2017 with atrial fibrillation to be initiated on dofetilide.   Assessment:  Patient Exclusion Criteria: If any screening criteria checked as "Yes", then  patient  should NOT receive dofetilide until criteria item is corrected. If "Yes" please indicate correction plan.  YES  NO Patient  Exclusion Criteria Correction Plan  [x]  []  Baseline QTc interval is greater than or equal to 440 msec. IF above YES box checked dofetilide contraindicated unless patient has ICD; then may proceed if QTc 500-550 msec or with known ventricular conduction abnormalities may proceed with QTc 550-600 msec. QTc =  450 EP ok to start  []  [x]  Magnesium level is less than 1.8 mEq/l : Last magnesium:  Lab Results  Component Value Date   MG 1.9 08/24/2017         []  [x]  Potassium level is less than 4 mEq/l : Last potassium:  Lab Results  Component Value Date   K 4.0 08/24/2017         []  [x]  Patient is known or suspected to have a digoxin level greater than 2 ng/ml: No results found for: DIGOXIN    []  [x]  Creatinine clearance less than 20 ml/min (calculated using Cockcroft-Gault, actual body weight and serum creatinine): Estimated Creatinine Clearance: 55.5 mL/min (by C-G formula based on SCr of 0.93 mg/dL).    []  [x]  Patient has received drugs known to prolong the QT intervals within the last 48 hours (phenothiazines, tricyclics or tetracyclic antidepressants, erythromycin, H-1 antihistamines, cisapride, fluoroquinolones, azithromycin). Drugs not listed above may have an, as yet, undetected potential to prolong the QT interval, updated information on QT prolonging agents is available at this website:QT prolonging agents No longer taking benadryl, EP PA to recommend patient get alternative to erythromycin for dental purposes  []  [x]  Patient received a dose of hydrochlorothiazide ([place]) alone or in any combination  including triamterene (Dyazide, Maxzide) in the last 48 hours. No longer taking hctz  []  [x]  Patient received a medication known to increase dofetilide plasma concentrations prior to initial dofetilide dose:  . Trimethoprim (Primsol, Proloprim) in the last 36 hours . Verapamil (Calan, Verelan) in the last 36 hours or a sustained release dose in the last 72 hours . Megestrol (Megace) in the last 5 days  . Cimetidine (Tagamet) in the last 6 hours . Ketoconazole (Nizoral) in the last 24 hours . Itraconazole (Sporanox) in the last 48 hours  . Prochlorperazine (Compazine) in the last 36 hours    []  [x]  Patient is known to have a history of torsades de pointes; congenital or acquired long QT syndromes.   []  [x]  Patient has received a Class 1 antiarrhythmic with less than 2 half-lives since last dose. (Disopyramide, Quinidine, Procainamide, Lidocaine, Mexiletine, Flecainide, Propafenone) Has been off flecainide since Friday  []  [x]  Patient has received amiodarone therapy in the past 3 months or amiodarone level is greater than 0.3 ng/ml.    Patient has been appropriately anticoagulated with warfarin.  Ordering provider was confirmed at [email or web address] if they are not listed on the [place] Prescribers list.  Goal of Therapy: Follow renal function, electrolytes, potential drug interactions, and dose adjustment. Provide education and 1 week supply at discharge.  Plan:  [x]   Physician selected initial dose within range recommended for patients level of renal function - will monitor for response.  []   Physician selected initial dose outside of range recommended for patients level of renal function - will discuss  if the dose should be altered at this time.   Select One Calculated CrCl  Dose q12h  [x]  > 60 ml/min 500 mcg  []  40-60 ml/min 250 mcg  []  20-40 ml/min 125 mcg   2. Follow up QTc after the first 5 doses, renal function, electrolytes (K & Mg) daily x 3     days, dose  adjustment, success of initiation and facilitate 1 week discharge supply as     clinically indicated.  3. Initiate Tikosyn education video (Call [phone] and ask for Tikosyn Video # 116).  4. Place Enrollment Form on the chart for discharge supply of dofetilide.   Elicia Lamp, PharmD, BCPS Clinical Pharmacist Clinical phone for 08/24/2017 until 3:30pm: [phone] If after 3:30pm, please call main pharmacy at: [phone] 08/24/2017 2:13 PM

## 2017-08-24 NOTE — Progress Notes (Signed)
[facility] [place] for warfarin Indication: atrial fibrillation   Assessment: 57 yof on warfarin PTA for persistent afib here for Tikosyn load. Pharmacy consulted to dose warfarin inpatient. TEE negative for thrombus. INR therapeutic on admit at 2.2. CBC wnl. No bleed issues documented. DDI with Tikosyn already addressed by oupatient Rx and EP PA prior to med start. EP to ensure patient has alternate recommendations for dental procedures (currently erythromycin per med rec).  PTA warfarin dose: 5mg  daily except 2.5mg  on Mon/Fri (last dose 5/27 PTA)  Goal of Therapy:  INR 2-3 Monitor platelets by anticoagulation protocol: Yes   Plan:  Warfarin 5mg  PO x 1 dose Daily INR Monitor CBC, s/sx bleeding  Elicia Lamp, PharmD, BCPS Clinical Pharmacist 08/24/2017 2:02 PM

## 2017-08-24 NOTE — H&P (View-Only) (Signed)
Primary Care Physician: Monico Blitz, MD Referring Physician: Dr. [name] is a 77 y.o. female with a h/o COPD, HTN, OSA, atrial flutter and paroxysmal afib tht has been persistent for several weeks. She is in the afib clinic for admission for Tikosyn. She has now failed flecainide, many cardioversion's in the past, has been off drug since Friday. She has not had the 4 weekly therapaeutic INR's and will have an INR and if in range TEE prior to admission. No benadryl use. She is no longer on HCTZ.   Today, she denies symptoms of palpitations, chest pain, shortness of breath, orthopnea, PND, lower extremity edema, dizziness, presyncope, syncope, or neurologic sequela. The patient is tolerating medications without difficulties and is otherwise without complaint today.   Past Medical History:  Diagnosis Date  . Allergic rhinitis   . Anxiety   . Atrial flutter ([place])   . COPD (chronic obstructive pulmonary disease) ([place])   . Depression   . Essential hypertension   . Hyperlipidemia   . Lumbar disc disease   . Obesity   . OSA (obstructive sleep apnea)    CPAP  . Paroxysmal atrial fibrillation (HCC)    Element of tachycardia bradycardia syndrome  . Respiratory failure [facility])    Past Surgical History:  Procedure Laterality Date  . ANTERIOR FUSION LUMBAR SPINE    . BIOPSY  08/27/2016   Procedure: BIOPSY;  Surgeon: Rogene Houston, MD;  Location: [facility] ENDO SUITE;  Service: Endoscopy;;  FOUR GASTRIC POLYPS BIOPSIED  . CARDIOVERSION N/A 03/28/2014   Procedure: CARDIOVERSION;  Surgeon: Fay Records, MD;  Location: [facility] ORS;  Service: Cardiovascular;  Laterality: N/A;  . CARDIOVERSION N/A 10/22/2016   Procedure: CARDIOVERSION;  Surgeon: Sueanne Margarita, MD;  Location: [facility] ENDOSCOPY;  Service: Cardiovascular;  Laterality: N/A;  . CATARACT EXTRACTION    . COLONOSCOPY N/A 08/04/2012   Procedure: COLONOSCOPY;  Surgeon: Rogene Houston, MD;  Location: [facility] ENDO SUITE;  Service: Endoscopy;   Laterality: N/A;  730-rescheduled to [place] notified pt  . ELECTROPHYSIOLOGIC STUDY N/A 09/18/2014   Procedure: Atrial Fibrillation Ablation;  Surgeon: Thompson Grayer, MD;  Location: [place] CV LAB;  Service: Cardiovascular;  Laterality: N/A;  . ESOPHAGOGASTRODUODENOSCOPY N/A 08/27/2016   Procedure: ESOPHAGOGASTRODUODENOSCOPY (EGD);  Surgeon: Rogene Houston, MD;  Location: [facility] ENDO [facility];  Service: Endoscopy;  Laterality: N/A;  1200  . LASIK     Both eyes  . TEE WITHOUT CARDIOVERSION N/A 09/18/2014   Procedure: TRANSESOPHAGEAL ECHOCARDIOGRAM (TEE);  Surgeon: Larey Dresser, MD;  Location: [place];  Service: Cardiovascular;  Laterality: N/A;  . TEE WITHOUT CARDIOVERSION N/A 10/22/2016   Procedure: TRANSESOPHAGEAL ECHOCARDIOGRAM (TEE);  Surgeon: Sueanne Margarita, MD;  Location: [facility] ENDOSCOPY;  Service: Cardiovascular;  Laterality: N/A;  . TOTAL HIP ARTHROPLASTY  2010   Right    Current Outpatient Medications  Medication Sig Dispense Refill  . acetaminophen (TYLENOL) 500 MG tablet Take 500 mg by mouth daily as needed for headache.    Marland Kitchen antiseptic oral rinse (BIOTENE) LIQD 15 mLs by Mouth Rinse route as needed for dry mouth.    Marland Kitchen atorvastatin (LIPITOR) 10 MG tablet Take 10 mg by mouth every evening.     . baclofen (LIORESAL) 10 MG tablet Take 10 mg by mouth as needed for muscle spasms.    . Biotin 5000 MCG CAPS Take 5,000 mcg by mouth daily.    . budesonide (PULMICORT) 0.5 MG/2ML nebulizer solution Take 0.5 mg by  nebulization 2 (two) times daily.     . cetirizine (ZYRTEC) 10 MG tablet Take 10 mg by mouth daily as needed for allergies.    . cycloSPORINE (RESTASIS) 0.05 % ophthalmic emulsion Place 1 drop 2 (two) times daily into both eyes.     Marland Kitchen diltiazem (CARTIA XT) 120 MG 24 hr capsule Take 120 mg by mouth at bedtime.     Marland Kitchen erythromycin ethylsuccinate (EES) 400 MG tablet Take 1,600 mg by mouth as directed. 1 hour prior to dental procedures    . fluticasone (FLONASE) 50 MCG/ACT nasal  spray Place 1 spray into both nostrils 2 (two) times daily.    . Hypromellose (ARTIFICIAL TEARS OP) Apply 1 drop to eye every 4 (four) hours as needed (dry eyes).     Marland Kitchen losartan (COZAAR) 100 MG tablet Take 1 tablet (100 mg total) by mouth daily. 30 tablet 3  . Mepolizumab (NUCALA [place]) Inject into the skin every 30 (thirty) days.    . montelukast (SINGULAIR) 10 MG tablet Take 10 mg by mouth at bedtime.    Marland Kitchen omeprazole (PRILOSEC) 20 MG capsule Take 20 mg by mouth daily.    Marland Kitchen omeprazole (PRILOSEC) 20 MG capsule Take 20 mg by mouth daily.    . potassium chloride SA (K-DUR,KLOR-CON) 20 MEQ tablet Take 1 tablet (20 mEq total) daily by mouth. 30 tablet 6  . sodium chloride (OCEAN) 0.65 % SOLN nasal spray Place 1 spray into both nostrils 2 (two) times daily.     Marland Kitchen triamcinolone ointment (KENALOG) 0.1 % Apply 1 application topically daily as needed for rash.  2  . umeclidinium-vilanterol (ANORO ELLIPTA) 62.5-25 MCG/INH AEPB Inhale 1 puff into the lungs daily. 2 each 0  . warfarin (COUMADIN) 5 MG tablet Take 1 tablet (5 mg total) by mouth daily. (Patient taking differently: Take 2.5-5 mg by mouth every evening. Take 2.5 mg daily on Mon and Fri, then take 5mg s daily on Tues, Wed, Thur, Sat, and Sun) 45 tablet 3  . Wheat Dextrin (BENEFIBER ON THE GO) POWD Take 1 Dose by mouth daily as needed (constipation).     . Fluticasone-Umeclidin-Vilant (TRELEGY ELLIPTA) 100-62.5-25 MCG/INH AEPB Inhale 1 puff into the lungs daily. Then rinse mouth, once daily (Patient not taking: Reported on 08/24/2017) 60 each 12  . ipratropium (ATROVENT) 0.06 % nasal spray Place 2 sprays into both nostrils 4 (four) times daily as needed for rhinitis.     Marland Kitchen ipratropium-albuterol (DUONEB) 0.5-2.5 (3) MG/3ML SOLN Take 3 mLs by nebulization 2 (two) times daily.     . ondansetron (ZOFRAN) 4 MG tablet Take 4 mg by mouth daily as needed for nausea or vomiting.     Current Facility-Administered Medications  Medication Dose Route Frequency  Provider Last Rate Last Dose  . Mepolizumab SOLR 100 mg  100 mg Subcutaneous Q28 days Baird Lyons D, MD   100 mg at 08/20/17 0850    Allergies  Allergen Reactions  . Bupropion Hcl Other (See Comments)    Suicidal Thoughts.   . Escitalopram Oxalate Other (See Comments)    Suicidal Thoughts.   . Fluticasone-Salmeterol Other (See Comments)    Advair - Caused patient to go into Afib.   Marland Kitchen Lisinopril Cough  . Serevent Other (See Comments)    Caused patient to go into Afib  . Macrodantin [Nitrofurantoin] Rash  . Penicillins Rash and Other (See Comments)    Childhood allergy  Has patient had a PCN reaction causing immediate rash, facial/tongue/throat swelling, SOB or lightheadedness  with hypotension: Unknown Has patient had a PCN reaction causing severe rash involving mucus membranes or skin necrosis: Unknown Has patient had a PCN reaction that required hospitalization No Has patient had a PCN reaction occurring within the last 10 years: No If all of the above answers are "NO", then may proceed with Cephalosporin use.     Social History   Socioeconomic History  . Marital status: Single    Spouse name: Not on file  . Number of children: Not on file  . Years of education: Not on file  . Highest education level: Not on file  Occupational History  . Occupation: Retired: Presenter, broadcasting  Social Needs  . Financial resource strain: Not on file  . Food insecurity:    Worry: Not on file    Inability: Not on file  . Transportation needs:    Medical: Not on file    Non-medical: Not on file  Tobacco Use  . Smoking status: Never Smoker  . Smokeless tobacco: Never Used  Substance and Sexual Activity  . Alcohol use: No    Alcohol/week: 0.0 oz  . Drug use: No  . Sexual activity: Not on file  Lifestyle  . Physical activity:    Days per week: Not on file    Minutes per session: Not on file  . Stress: Not on file  Relationships  . Social connections:    Talks on phone: Not on file     Gets together: Not on file    Attends religious service: Not on file    Active member of club or organization: Not on file    Attends meetings of clubs or organizations: Not on file    Relationship status: Not on file  . Intimate partner violence:    Fear of current or ex partner: Not on file    Emotionally abused: Not on file    Physically abused: Not on file    Forced sexual activity: Not on file  Other Topics Concern  . Not on file  Social History Narrative   Pt lives in [place] alone. She was never married.   Retired Customer service manager.   Attends [employer]    Family History  Problem Relation Age of Onset  . Cancer Mother   . Heart attack Father   . Colon cancer Other     ROS- All systems are reviewed and negative except as per the HPI above  Physical Exam: Vitals:   08/24/17 0830  BP: 110/66  Pulse: 93  Weight: 208 lb 9.6 oz (94.6 kg)  Height: 5' 3.5" (1.613 m)   Wt Readings from Last 3 Encounters:  08/24/17 208 lb 9.6 oz (94.6 kg)  08/20/17 209 lb (94.8 kg)  07/20/17 206 lb (93.4 kg)    Labs: Lab Results  Component Value Date   NA 137 02/10/2017   K 3.2 (L) 02/10/2017   CL 105 02/10/2017   CO2 23 02/10/2017   GLUCOSE 123 (H) 02/10/2017   BUN 16 02/10/2017   CREATININE 1.06 (H) 02/10/2017   CALCIUM 9.0 02/10/2017   Lab Results  Component Value Date   INR 2.30 02/10/2017   No results found for: CHOL, HDL, LDLCALC, TRIG   GEN- The patient is well appearing, alert and oriented x 3 today.   Head- normocephalic, atraumatic Eyes-  Sclera clear, conjunctiva pink Ears- hearing intact Oropharynx- clear Neck- supple, no JVP Lymph- no cervical lymphadenopathy Lungs- Clear to ausculation bilaterally, normal work of breathing Heart- irregular  rate and rhythm, no murmurs, rubs or gallops, PMI not laterally displaced GI- soft, NT, ND, + BS Extremities- no clubbing, cyanosis, or edema MS- no significant deformity or atrophy Skin- no rash or  lesion Psych- euthymic mood, full affect Neuro- strength and sensation are intact  EKG- atrial fibrillation at 93 bpm, qrs int 90 ms, qtc 450 ms    Assessment and Plan: 1. Persistent  afib Has failed flecainide Off drug since Friday No benadryl  INR this am therapeutic at 2.20, and K+ at 4.0, and mag at 1.9 Crcl cal at 75.45 She will proceed for TEE and if no thrombus, proceed with admission for tikosyn admission  She believes  that she  will quality for drug assistance and forms have been given to pt to fill out during admission and we will fax in on day of discharge when her exact diose of dofetilide is known qtc 450 ms Drugs screened by pharmD, Zofran is on list.. She  has not taken and was told she could not take drug with dofetilide  Butch Penny C. [place], [facility] [address] [place], [place] [address] [phone]

## 2017-08-25 ENCOUNTER — Other Ambulatory Visit: Payer: Self-pay

## 2017-08-25 LAB — BASIC METABOLIC PANEL
Anion gap: 9 (ref 5–15)
BUN: 13 mg/dL (ref 6–20)
CO2: 28 mmol/L (ref 22–32)
CREATININE: 0.89 mg/dL (ref 0.44–1.00)
Calcium: 9.3 mg/dL (ref 8.9–10.3)
Chloride: 105 mmol/L (ref 101–111)
GFR calc Af Amer: >60 mL/min (ref >60)
GFR calc non Af Amer: >60 mL/min (ref >60)
GLUCOSE: 100 mg/dL — AB (ref 65–99)
Potassium: 4.4 mmol/L (ref 3.5–5.1)
Sodium: 142 mmol/L (ref 135–145)

## 2017-08-25 LAB — PROTIME-INR
INR: 2.16
Prothrombin Time: 23.9 seconds — ABNORMAL HIGH (ref 11.4–15.2)

## 2017-08-25 LAB — MAGNESIUM: Magnesium: 2.1 mg/dL (ref 1.7–2.4)

## 2017-08-25 MED ORDER — WARFARIN SODIUM 5 MG PO TABS
5.0000 mg | ORAL_TABLET | Freq: Once | ORAL | Status: AC
  Administered 2017-08-25: 5 mg via ORAL
  Filled 2017-08-25: qty 1

## 2017-08-25 NOTE — Care Management Note (Addendum)
Case Management Note  Patient Details  Name: [name] [name] MRN: [id number] Date of Birth: Oct 06, 1940  Subjective/Objective: Pt presented for Tikosyn Load. PTA Independent from home. Pt uses [facility] and medication can be ordered. Pt has Part D Rx coverage via [employer], however pt is worried about the Coverage Gap-(donut hole). A fib clinic provided pt with Patient Assistance Application for Tikosyn (needs to be filled out once aware of dosage). Per notes Afib Clinic will fax the paper work to see if pt is eligible for year supply.                 Action/Plan: Benefits Check in process and CM will make patient aware of cost. CM will assist with the 7 day supply no refills via [place] and pt will need an additional Rx with refills. No further needs from CM at this time.    Expected Discharge Date:                  Expected Discharge Plan:  Home/Self Care  In-House Referral:  NA  Discharge planning Services  CM Consult, Medication Assistance  Post Acute Care Choice:  NA Choice offered to:  NA  DME Arranged:  N/A DME Agency:  NA  HH Arranged:  NA HH Agency:  NA  Status of Service:  Completed, signed off  If discussed at [place] of Stay Meetings, dates discussed:    Additional Comments: 1227 08-25-17 Jacqlyn Krauss, RN, BSN [phone] [address] [place]  @ [place] RX # [phone]    1. TIKOSYN   500 MCG BID  [place] # [phone]    2. DOFETILIDE  500 MCG BID  COVER- YES  CO- PAY- $ 139.83  TIER- 4 DRUG  PRIOR APPROVAL- NO  NO DEDUCTIBLE   M/O FOR 90 DAY SUPPLY $ 209.51  PREFERRED PHARMACY : YES [facility]  Bethena Roys, RN 08/25/2017, 10:52 AM

## 2017-08-25 NOTE — Progress Notes (Signed)
[place] for warfarin Indication: atrial fibrillation   Assessment: 28 yof on warfarin PTA for persistent afib here for Tikosyn load. Pharmacy consulted to dose warfarin inpatient. TEE negative for thrombus. INR remains therapeutic. CBC wnl. No bleed issues documented. DDI with Tikosyn already addressed by oupatient Rx and EP PA prior to med start. EP to ensure patient has alternate recommendations for dental procedures (currently erythromycin per med rec).  PTA warfarin dose: 5mg  daily except 2.5mg  on Mon/Fri (last dose 5/27 PTA)  Goal of Therapy:  INR 2-3 Monitor platelets by anticoagulation protocol: Yes   Plan:  Warfarin 5mg  PO x 1 dose Daily INR Monitor CBC, s/sx bleeding  Elicia Lamp, PharmD, BCPS Clinical Pharmacist 08/25/2017 8:45 AM

## 2017-08-25 NOTE — Progress Notes (Addendum)
Progress Note  Patient Name: [name] [name] Date of Encounter: 08/25/2017  Primary Cardiologist: Dr. Domenic Polite Electrophysiologist: Dr. Rayann Heman  Subjective   Tolerating drug, no CP, palpitations, was surprised she was in SR, could not tell the difference.  Gets a little winded with exertion, no rest SOB  Inpatient Medications    Scheduled Meds: . atorvastatin  10 mg Oral QPM  . budesonide  0.5 mg Nebulization BID  . cycloSPORINE  1 drop Both Eyes BID  . diltiazem  120 mg Oral QHS  . dofetilide  500 mcg Oral BID  . fluticasone  1 spray Each Nare BID  . losartan  100 mg Oral Daily  . montelukast  10 mg Oral QHS  . pantoprazole  40 mg Oral Daily  . potassium chloride SA  20 mEq Oral Daily  . sodium chloride flush  3 mL Intravenous Q12H  . umeclidinium-vilanterol  1 puff Inhalation Daily  . warfarin  5 mg Oral ONCE-1800  . Warfarin - Pharmacist Dosing Inpatient   Does not apply q1800   Continuous Infusions: . sodium chloride     PRN Meds: sodium chloride, acetaminophen, sodium chloride flush   Vital Signs    Vitals:   08/24/17 2027 08/24/17 2300 08/25/17 0419 08/25/17 0826  BP:   (!) 154/102   Pulse:  81 (!) 111   Resp:  16    Temp:   (!) 97.4 F (36.3 C)   TempSrc:   Oral   SpO2: 99% 99% 97% 97%  Weight:   206 lb 8 oz (93.7 kg)   Height:        Intake/Output Summary (Last 24 hours) at 08/25/2017 0945 Last data filed at 08/24/2017 2000 Gross per 24 hour  Intake 684 ml  Output -  Net 684 ml   Filed Weights   08/24/17 1243 08/25/17 0419  Weight: 205 lb 8 oz (93.2 kg) 206 lb 8 oz (93.7 kg)    Telemetry    Afib w/CVR >>> SR 60-70's - Personally Reviewed  ECG    Post dose EKG is AFib 98bpm, QTc OK - Personally Reviewed  Physical Exam   GEN: No acute distress.   Neck: No JVD Cardiac: RRR, no murmurs, rubs, or gallops.  Respiratory: CTA b/l, no wheezing appreciated. GI: Soft, nontender, non-distended  MS: No edema; No deformity. Neuro:  Nonfocal    Psych: Normal affect   Labs    Chemistry Recent Labs  Lab 08/24/17 0812 08/25/17 0646  NA 141 142  K 4.0 4.4  CL 105 105  CO2 27 28  GLUCOSE 97 100*  BUN 12 13  CREATININE 0.93 0.89  CALCIUM 9.3 9.3  GFRNONAA 58* >60  GFRAA >60 >60  ANIONGAP 9 9     Hematology Recent Labs  Lab 08/24/17 0812  WBC 7.9  RBC 4.44  HGB 12.2  HCT 38.5  MCV 86.7  MCH 27.5  MCHC 31.7  RDW 15.4  PLT 222    Cardiac EnzymesNo results for input(s): TROPONINI in the last 168 hours. No results for input(s): TROPIPOC in the last 168 hours.   BNPNo results for input(s): BNP, PROBNP in the last 168 hours.   DDimer No results for input(s): DDIMER in the last 168 hours.   Radiology    No results found.  Cardiac Studies   08/24/17: TEE EF 60% LAE No LAA thrombus Normal RV/RA No ASD/PFO Normal AV Mild MR No significant aortic debris Normal aortic root  3D rendering of atrial  septum and MV performed  Ok to admit for Tikosyn loading    Patient Profile     77 y.o. female with a history of persistent AFib, AFlutter, COPD, HTN, OSA w/CPAP, hx of bradycardia requiring down-titration of meds, admitted for Tikosyn initiation  Assessment & Plan    1. Persistent AFib, here for Tikosyn initiation     CHA2DS2Vasc is 4, on warfarin     K+ 4.4     Mag 2.1     Creat 0.89 (stable)     INR 2.16, pharmacy managing     QTc stable post 1st dose  Will discuss dental prophylaxis options She has converted to SR this AM, follow QTc post dose this AM, is OK on telemetry  2. OSA     continue CPAP here  3. HTN     no changes today  4. Allregies/asthma     Went through med list with the patient She is not actively using a number of her PRN meds or her neb, asked they not be re-ordered while here      Revisited her allergy/resp meds this AM, she is not actively using her nebulizer, reports that it made her feel worse and sopped it weeks ago, does not want a PRN       She is not  wheezing, no rest SOB   For questions or updates, please contact [place] Please consult [email or web address] for contact info under Cardiology/STEMI.      Signed, Baldwin Jamaica, PA-C  08/25/2017, 9:45 AM    I have seen, examined the patient, and reviewed the above assessment and plan.  Changes to above are made where necessary.  On exam, RRR.  Now in sinus rhythm.  Will continue to follow qt closely.  Co Sign: Thompson Grayer, MD 08/25/2017 9:16 PM

## 2017-08-26 ENCOUNTER — Ambulatory Visit (HOSPITAL_COMMUNITY): Admission: RE | Admit: 2017-08-26 | Payer: Medicare Other | Source: Ambulatory Visit | Admitting: Internal Medicine

## 2017-08-26 ENCOUNTER — Encounter (HOSPITAL_COMMUNITY)
Admission: RE | Disposition: A | Discharge status: Home / Self Care | Payer: Self-pay | Source: Home / Self Care | Attending: Internal Medicine

## 2017-08-26 ENCOUNTER — Other Ambulatory Visit: Payer: Self-pay

## 2017-08-26 LAB — BASIC METABOLIC PANEL
ANION GAP: 10 (ref 5–15)
BUN: 18 mg/dL (ref 6–20)
CALCIUM: 9.1 mg/dL (ref 8.9–10.3)
CO2: 26 mmol/L (ref 22–32)
Chloride: 105 mmol/L (ref 101–111)
Creatinine, Ser: 0.88 mg/dL (ref 0.44–1.00)
GFR calc Af Amer: >60 mL/min (ref >60)
GLUCOSE: 102 mg/dL — AB (ref 65–99)
Potassium: 4.3 mmol/L (ref 3.5–5.1)
Sodium: 141 mmol/L (ref 135–145)

## 2017-08-26 LAB — MAGNESIUM: MAGNESIUM: 2.2 mg/dL (ref 1.7–2.4)

## 2017-08-26 LAB — PROTIME-INR
INR: 2.37
Prothrombin Time: 25.7 seconds — ABNORMAL HIGH (ref 11.4–15.2)

## 2017-08-26 SURGERY — CARDIOVERSION
Anesthesia: General

## 2017-08-26 MED ORDER — WARFARIN SODIUM 5 MG PO TABS
5.0000 mg | ORAL_TABLET | Freq: Once | ORAL | Status: AC
  Administered 2017-08-26: 5 mg via ORAL
  Filled 2017-08-26: qty 1

## 2017-08-26 NOTE — Progress Notes (Signed)
Pt states that she places herself on and off CPAP. RT added water to the machine and educated pt to call if she needed further assistance.

## 2017-08-26 NOTE — Progress Notes (Signed)
Pt places self on CPAP and encouraged pt to call if she needs anything.

## 2017-08-26 NOTE — Progress Notes (Signed)
[place] for warfarin Indication: atrial fibrillation   Assessment: 24 yof on warfarin PTA for persistent afib here for Tikosyn load. Pharmacy consulted to dose warfarin inpatient. TEE negative for thrombus. INR remains therapeutic. CBC wnl. No bleed issues documented. DDI with Tikosyn already addressed by oupatient Rx and EP PA prior to med start. EP to ensure patient has alternate recommendations for dental procedures (currently erythromycin per med rec).  PTA warfarin dose: 5mg  daily except 2.5mg  on Mon/Fri (last dose 5/27 PTA)  Goal of Therapy:  INR 2-3 Monitor platelets by anticoagulation protocol: Yes   Plan:  Warfarin 5mg  PO x 1 dose Daily INR Monitor CBC, s/sx bleeding  Elicia Lamp, PharmD, BCPS Clinical Pharmacist 08/26/2017 9:14 AM

## 2017-08-26 NOTE — Progress Notes (Addendum)
Progress Note  Patient Name: [name] [name] Date of Encounter: 08/26/2017  Primary Cardiologist: Dr. Domenic Polite Electrophysiologist: Dr. Rayann Heman  Subjective   Tolerating drug, no CP, palpitations, early morning cough that she says is typical for her, ambulating in hall without difficulty this morning already  Inpatient Medications    Scheduled Meds: . atorvastatin  10 mg Oral QPM  . budesonide  0.5 mg Nebulization BID  . cycloSPORINE  1 drop Both Eyes BID  . diltiazem  120 mg Oral QHS  . dofetilide  500 mcg Oral BID  . fluticasone  1 spray Each Nare BID  . losartan  100 mg Oral Daily  . montelukast  10 mg Oral QHS  . pantoprazole  40 mg Oral Daily  . potassium chloride SA  20 mEq Oral Daily  . sodium chloride flush  3 mL Intravenous Q12H  . umeclidinium-vilanterol  1 puff Inhalation Daily  . Warfarin - Pharmacist Dosing Inpatient   Does not apply q1800   Continuous Infusions: . sodium chloride     PRN Meds: sodium chloride, acetaminophen, sodium chloride flush   Vital Signs    Vitals:   08/25/17 1523 08/25/17 2055 08/25/17 2103 08/26/17 0403  BP: (!) 111/50  118/69 (!) 141/64  Pulse: (!) 58 69 67 (!) 47  Resp:  18    Temp: 98 F (36.7 C)  97.9 F (36.6 C) 97.6 F (36.4 C)  TempSrc: Oral  Oral Oral  SpO2: 99% 99% 96% 97%  Weight:    207 lb 8 oz (94.1 kg)  Height:        Intake/Output Summary (Last 24 hours) at 08/26/2017 0756 Last data filed at 08/25/2017 2000 Gross per 24 hour  Intake 483 ml  Output -  Net 483 ml   Filed Weights   08/24/17 1243 08/25/17 0419 08/26/17 0403  Weight: 205 lb 8 oz (93.2 kg) 206 lb 8 oz (93.7 kg) 207 lb 8 oz (94.1 kg)    Telemetry    SB/SR 50's overnight, 60's - Personally Reviewed  ECG    Post dose EKG is SB 59bpm,, QTc OK - Personally Reviewed  Physical Exam   GEN: No acute distress.   Neck: No JVD Cardiac: RRR, no murmurs, rubs, or gallops.  Respiratory: CTA b/l, slight end-exp wheeze appreciated. GI: Soft,  nontender, non-distended  MS: No edema; No deformity. Neuro:  Nonfocal  Psych: Normal affect   Labs    Chemistry Recent Labs  Lab 08/24/17 0812 08/25/17 0646 08/26/17 0356  NA 141 142 141  K 4.0 4.4 4.3  CL 105 105 105  CO2 27 28 26   GLUCOSE 97 100* 102*  BUN 12 13 18   CREATININE 0.93 0.89 0.88  CALCIUM 9.3 9.3 9.1  GFRNONAA 58* >60 >60  GFRAA >60 >60 >60  ANIONGAP 9 9 10      Hematology Recent Labs  Lab 08/24/17 0812  WBC 7.9  RBC 4.44  HGB 12.2  HCT 38.5  MCV 86.7  MCH 27.5  MCHC 31.7  RDW 15.4  PLT 222    Cardiac EnzymesNo results for input(s): TROPONINI in the last 168 hours. No results for input(s): TROPIPOC in the last 168 hours.   BNPNo results for input(s): BNP, PROBNP in the last 168 hours.   DDimer No results for input(s): DDIMER in the last 168 hours.   Radiology    No results found.  Cardiac Studies   08/24/17: TEE EF 60% LAE No LAA thrombus Normal RV/RA No ASD/PFO  Normal AV Mild MR No significant aortic debris Normal aortic root  3D rendering of atrial septum and MV performed  Ok to admit for Tikosyn loading    Patient Profile     77 y.o. female with a history of persistent AFib, AFlutter, COPD, HTN, OSA w/CPAP, hx of bradycardia requiring down-titration of meds, admitted for Tikosyn initiation  Assessment & Plan    1. Persistent AFib, here for Tikosyn initiation     CHA2DS2Vasc is 4, on warfarin     K+ 4.3     Mag 2.2     Creat 0.88 (stable)     INR 2.37, pharmacy managing     QTc stable   Will discuss dental prophylaxis options Anticipate discharge tomorrow  2. OSA     continue CPAP here  3. HTN     no changes today  4. Allregies/asthma     Went through med list with the patient She is not actively using a number of her PRN allergy meds or her neb, asked they not be re-ordered while here      Revisited her allergy/resp meds yesterday, she is not actively using her nebulizer, reports that it made her  feel worse and stopped it weeks ago, does not want a PRN here      She has not yet made switch from her Anoro > Fluticasone (combination) inhaler, she reports as discussed with her pulm MD       no rest SOB       Continue home inhalers   For questions or updates, please contact [place] Please consult [email or web address] for contact info under Cardiology/STEMI.      Signed, Baldwin Jamaica, PA-C  08/26/2017, 7:56 AM    I have seen, examined the patient, and reviewed the above assessment and plan.  Changes to above are made where necessary.  On exam, RRR.  Doing well with tikosyn.  Qt is stable.  Anticipate discharge to home tomorrow with close follow-up in the AF clinic.  Co Sign: Thompson Grayer, MD 08/26/2017 9:19 PM

## 2017-08-27 ENCOUNTER — Other Ambulatory Visit: Payer: Self-pay

## 2017-08-27 LAB — BASIC METABOLIC PANEL
Anion gap: 5 (ref 5–15)
BUN: 16 mg/dL (ref 6–20)
CHLORIDE: 110 mmol/L (ref 101–111)
CO2: 26 mmol/L (ref 22–32)
CREATININE: 0.91 mg/dL (ref 0.44–1.00)
Calcium: 8.9 mg/dL (ref 8.9–10.3)
GFR calc Af Amer: >60 mL/min (ref >60)
GFR calc non Af Amer: 59 mL/min — ABNORMAL LOW (ref >60)
GLUCOSE: 98 mg/dL (ref 65–99)
Potassium: 4.4 mmol/L (ref 3.5–5.1)
Sodium: 141 mmol/L (ref 135–145)

## 2017-08-27 LAB — PROTIME-INR
INR: 2.59
Prothrombin Time: 27.5 seconds — ABNORMAL HIGH (ref 11.4–15.2)

## 2017-08-27 LAB — MAGNESIUM: Magnesium: 2 mg/dL (ref 1.7–2.4)

## 2017-08-27 MED ORDER — DOFETILIDE 500 MCG PO CAPS: 500.0000 ug | ORAL_CAPSULE | Freq: Two times a day (BID) | ORAL | 6 refills | Status: DC

## 2017-08-27 NOTE — Discharge Summary (Addendum)
ELECTROPHYSIOLOGY PROCEDURE DISCHARGE SUMMARY    Patient ID: [name] [name],  MRN: [id number], DOB/AGE: 1940/11/17 77 y.o.  Admit date: 08/24/2017 Discharge date: 08/27/2017  Primary Care Physician: Monico Blitz, MD  Primary Cardiologist: Dr. Domenic Polite Electrophysiologist: Dr. Rayann Heman  Primary Discharge Diagnosis:  1.  Persistent atrial fibrillation status post Tikosyn loading this admission      CHA2DS2Vasc is 4, on Warfarin, monitored and managed with her PMD      Patient reports a coumadin check visit scheduled/in place  Secondary Discharge Diagnosis:  1. OSA w/CPAP 2. HTN 3. Allregies/asthma     Follows with pulmonary service  Allergies  Allergen Reactions  . Bupropion Hcl Other (See Comments)    Suicidal Thoughts.   . Escitalopram Oxalate Other (See Comments)    Suicidal Thoughts.   . Fluticasone-Salmeterol Other (See Comments)    Advair - Caused patient to go into Afib.   Marland Kitchen Lisinopril Cough  . Serevent Other (See Comments)    Caused patient to go into Afib  . Macrodantin [Nitrofurantoin] Rash  . Penicillins Rash and Other (See Comments)    Childhood allergy  Has patient had a PCN reaction causing immediate rash, facial/tongue/throat swelling, SOB or lightheadedness with hypotension: Unknown Has patient had a PCN reaction causing severe rash involving mucus membranes or skin necrosis: Unknown Has patient had a PCN reaction that required hospitalization No Has patient had a PCN reaction occurring within the last 10 years: No If all of the above answers are "NO", then may proceed with Cephalosporin use.      Procedures This Admission:  1.  Tikosyn loading   Brief HPI: [name] [name] is a 77 y.o. female with a past medical history as noted above.  She is followed by EP and AFib clinic in the outpatient setting for treatment of atrial fibrillation.  Risks, benefits, and alternatives to Tikosyn were reviewed with the patient who wished to proceed.     Hospital Course:  The patient was admitted and Tikosyn was initiated.  Renal function and electrolytes were followed during the hospitalization.  Her QTc remained stable.  She did not require DCCV, converting with drug.  She was monitored until discharge on telemetry which demonstrated SR.  On the day of discharge, she is feeling well, she was examined by Dr Rayann Heman who considered her stable for discharge to home.  The patient reported during her admission that she is not using a number of her allergy/nebulizer therapies, she is encouraged to follow up with her pulmonologist to discuss with him further.  She was previously given Erythromycin for dental prophylaxis.  We discussed this, she knows she can no longer use this.  I have recommended Cleocin, confirmed with pharmacy is OK to use with Tikosyn, and this was written for her to give to her PMD who manages this for her.  Follow-up has been arranged with the AFib clinic in 1 week and with Dr Rayann Heman in 4 weeks.   Financial form is filled out, and will bring to AFib clinic to continue this process for the patient.  Physical Exam: Vitals:   08/26/17 2043 08/27/17 0541 08/27/17 0906 08/27/17 0907  BP: 139/66 (!) 147/69    Pulse: (!) 51 (!) 52    Resp:      Temp: (!) 97.5 F (36.4 C) 97.6 F (36.4 C)    TempSrc: Oral Oral    SpO2: 97% 98% 96% 97%  Weight:  206 lb 14.4 oz (93.8 kg)  Height:        GEN- The patient is well appearing, alert and oriented x 3 today.   HEENT: normocephalic, atraumatic; sclera clear, conjunctiva pink; hearing intact; oropharynx clear; neck supple, no JVP Lymph- no cervical lymphadenopathy Lungs- CTA b/l, normal work of breathing.  No wheezes, rales, rhonchi Heart- RRR, no murmurs, rubs or gallops, PMI not laterally displaced GI- soft, non-tender, non-distended Extremities- no clubbing, cyanosis, or edema MS- no significant deformity or atrophy Skin- warm and dry, no rash or lesion Psych- euthymic mood, full  affect Neuro- strength and sensation are intact   Labs:   Lab Results  Component Value Date   WBC 7.9 08/24/2017   HGB 12.2 08/24/2017   HCT 38.5 08/24/2017   MCV 86.7 08/24/2017   PLT 222 08/24/2017    Recent Labs  Lab 08/27/17 0313  NA 141  K 4.4  CL 110  CO2 26  BUN 16  CREATININE 0.91  CALCIUM 8.9  GLUCOSE 98     Discharge Medications:  Allergies as of 08/27/2017      Reactions   Bupropion Hcl Other (See Comments)   Suicidal Thoughts.    Escitalopram Oxalate Other (See Comments)   Suicidal Thoughts.    Fluticasone-salmeterol Other (See Comments)   Advair - Caused patient to go into Afib.    Lisinopril Cough   Serevent Other (See Comments)   Caused patient to go into Afib   Macrodantin [nitrofurantoin] Rash   Penicillins Rash, Other (See Comments)   Childhood allergy  Has patient had a PCN reaction causing immediate rash, facial/tongue/throat swelling, SOB or lightheadedness with hypotension: Unknown Has patient had a PCN reaction causing severe rash involving mucus membranes or skin necrosis: Unknown Has patient had a PCN reaction that required hospitalization No Has patient had a PCN reaction occurring within the last 10 years: No If all of the above answers are "NO", then may proceed with Cephalosporin use.      Medication List    TAKE these medications   acetaminophen 500 MG tablet Commonly known as:  TYLENOL Take 500 mg by mouth daily as needed for headache.   antiseptic oral rinse Liqd 15 mLs by Mouth Rinse route as needed for dry mouth.   ARTIFICIAL TEARS OP Apply 1 drop to eye every 4 (four) hours as needed (dry eyes).   atorvastatin 10 MG tablet Commonly known as:  LIPITOR Take 10 mg by mouth every evening.   baclofen 10 MG tablet Commonly known as:  LIORESAL Take 10 mg by mouth as needed for muscle spasms.   BENEFIBER ON THE GO Powd Take 1 Dose by mouth daily as needed (constipation).   Biotin 5000 MCG Caps Take 5,000 mcg by mouth  daily.   budesonide 0.5 MG/2ML nebulizer solution Commonly known as:  PULMICORT Take 0.5 mg by nebulization 2 (two) times daily.   CARTIA XT 120 MG 24 hr capsule Generic drug:  diltiazem Take 120 mg by mouth at bedtime.   cetirizine 10 MG tablet Commonly known as:  ZYRTEC Take 10 mg by mouth daily as needed for allergies.   cycloSPORINE 0.05 % ophthalmic emulsion Commonly known as:  RESTASIS Place 1 drop 2 (two) times daily into both eyes.   dofetilide 500 MCG capsule Commonly known as:  TIKOSYN Take 1 capsule (500 mcg total) by mouth 2 (two) times daily.   fluticasone 50 MCG/ACT nasal spray Commonly known as:  FLONASE Place 1 spray into both nostrils 2 (two) times daily.  Fluticasone-Umeclidin-Vilant 100-62.5-25 MCG/INH Aepb Commonly known as:  TRELEGY ELLIPTA Inhale 1 puff into the lungs daily. Then rinse mouth, once daily   ipratropium 0.06 % nasal spray Commonly known as:  ATROVENT Place 2 sprays into both nostrils 4 (four) times daily as needed for rhinitis.   ipratropium-albuterol 0.5-2.5 (3) MG/3ML Soln Commonly known as:  DUONEB Take 3 mLs by nebulization 2 (two) times daily.   losartan 100 MG tablet Commonly known as:  COZAAR Take 1 tablet (100 mg total) by mouth daily.   montelukast 10 MG tablet Commonly known as:  SINGULAIR Take 10 mg by mouth at bedtime.   NUCALA [place] Inject into the skin every 30 (thirty) days.   omeprazole 20 MG capsule Commonly known as:  PRILOSEC Take 20 mg by mouth daily.   potassium chloride SA 20 MEQ tablet Commonly known as:  K-DUR,KLOR-CON Take 1 tablet (20 mEq total) daily by mouth.   sodium chloride 0.65 % Soln nasal spray Commonly known as:  OCEAN Place 1 spray into both nostrils 2 (two) times daily.   triamcinolone ointment 0.1 % Commonly known as:  KENALOG Apply 1 application topically daily as needed for rash.   umeclidinium-vilanterol 62.5-25 MCG/INH Aepb Commonly known as:  ANORO ELLIPTA Inhale 1 puff into  the lungs daily.   warfarin 5 MG tablet Commonly known as:  COUMADIN Take 1 tablet (5 mg total) by mouth daily. What changed:    how much to take  when to take this  additional instructions Notes to patient:  Continue your home regime unchanged, keep your lab appointment as usual with your primary doctor's office for coumadin check       Disposition:  Home Discharge Instructions    Diet - low sodium heart healthy   Complete by:  As directed    Increase activity slowly   Complete by:  As directed      Follow-up Information    Monico Blitz, MD Follow up.   Specialty:  Internal Medicine Why:  keep your scheduled coumadin check/lab as usual  Contact information: [place] [address] [phone]         [facility] Follow up on 09/01/2017.   Specialty:  Cardiology Why:  11:30AM Contact information: [address] [id number] [place] [phone]       Thompson Grayer, MD Follow up on 09/27/2017.   Specialty:  Cardiology Why:  1:45PM Contact information: [place] [address] [phone]           Duration of Discharge Encounter: Greater than 30 minutes including physician time.  Signed, Tommye Standard, PA-C 08/27/2017 12:52 PM   I have seen, examined the patient, and reviewed the above assessment and plan.  Changes to above are made where necessary.  On exam, RRR.  QT is stable.  DC with close outpatient follow-up.  Co Sign: Thompson Grayer, MD

## 2017-08-27 NOTE — Care Management Important Message (Signed)
Important Message  Patient Details  Name: [name] [name] MRN: [id number] Date of Birth: February 17, 1941   Medicare Important Message Given:  Yes    Kechia Yahnke P Rockwell 08/27/2017, 2:57 PM

## 2017-08-27 NOTE — Discharge Instructions (Addendum)
Atrial Fibrillation Atrial fibrillation is a type of heartbeat that is irregular or fast (rapid). If you have this condition, your heart keeps quivering in a weird (chaotic) way. This condition can make it so your heart cannot pump blood normally. Having this condition gives a person more risk for stroke, heart failure, and other heart problems. There are different types of atrial fibrillation. Talk with your doctor to learn about the type that you have. Follow these instructions at home:  Take over-the-counter and prescription medicines only as told by your doctor.  If your doctor prescribed a blood-thinning medicine, take it exactly as told. Taking too much of it can cause bleeding. If you do not take enough of it, you will not have the protection that you need against stroke and other problems.  Do not use any tobacco products. These include cigarettes, chewing tobacco, and e-cigarettes. If you need help quitting, ask your doctor.  If you have apnea (obstructive sleep apnea), manage it as told by your doctor.  Do not drink alcohol.  Do not drink beverages that have caffeine. These include coffee, soda, and tea.  Maintain a healthy weight. Do not use diet pills unless your doctor says they are safe for you. Diet pills may make heart problems worse.  Follow diet instructions as told by your doctor.  Exercise regularly as told by your doctor.  Keep all follow-up visits as told by your doctor. This is important. Contact a doctor if:  You notice a change in the speed, rhythm, or strength of your heartbeat.  You are taking a blood-thinning medicine and you notice more bruising.  You get tired more easily when you move or exercise. Get help right away if:  You have pain in your chest or your belly (abdomen).  You have sweating or weakness.  You feel sick to your stomach (nauseous).  You notice blood in your throw up (vomit), poop (stool), or pee (urine).  You are short of  breath.  You suddenly have swollen feet and ankles.  You feel dizzy.  Your suddenly get weak or numb in your face, arms, or legs, especially if it happens on one side of your body.  You have trouble talking, trouble understanding, or both.  Your face or your eyelid droops on one side. These symptoms may be an emergency. Do not wait to see if the symptoms will go away. Get medical help right away. Call your local emergency services (911 in the [place] Do not drive yourself to the hospital. This information is not intended to replace advice given to you by your health care provider. Make sure you discuss any questions you have with your health care provider. Document Released: 12/24/2007 Document Revised: 08/22/2015 Document Reviewed: 07/11/2014 Elsevier Interactive Patient Education  2018 [place] Need to Know About Warfarin Warfarin is a blood thinner (anticoagulant). Anticoagulants help to prevent the formation of blood clots. They also help to stop the growth of blood clots. Who should use warfarin? Warfarin is prescribed for people who are at risk for developing harmful blood clots, such as people who have:  Surgically implanted mechanical heart valves.  Irregular heart rhythms (atrial fibrillation).  Certain clotting disorders.  A history of harmful blood clotting in the past. This includes people who have had: ? A stroke. ? Blood clot in the lungs (pulmonary embolism, or PE). ? Blood clot in the legs (deep vein thrombosis, or DVT).  An existing blood clot.  How is  warfarin taken?  Warfarin is a medicine that you take by mouth (orally). Warfarin tablets come in different strengths. Each tablet strength is a different color, with the amount of warfarin printed on the tablet. If you get a new prescription filled and the color of your tablet is different than usual, tell your pharmacist or health care provider immediately. What blood tests do I need while taking  warfarin? The goal of warfarin therapy is to lessen the clotting tendency of blood, but not to prevent clotting completely. Your health care provider will monitor the anticoagulation effect of warfarin closely and will adjust your dose as needed. Warfarin is a medicine that needs to be closely monitored, so it is very important to keep all lab visits and follow-up visits with your health care provider. While taking warfarin, you will need to have blood tests (prothrombin tests, or PT tests) regularly to measure your blood clotting time. This type of test can be done with a finger stick or a blood draw. What does the INR test result mean? The PT test results will be reported as the International Normalized Ratio (INR). The INR tells your health care provider whether your dosage of warfarin needs to be changed. The longer it takes your blood to clot, the higher the INR. Your health care provider will tell you your target INR range. If your INR is not in your target range, your health care provider may adjust your dosage.  If your INR is above your target range, there is a risk of bleeding. Your dosage of warfarin may need to be decreased.  If your INR is below your target range, there is a risk of clotting. Your dosage of warfarin may need to be increased.  How often is the INR test needed?  When you first start warfarin, you will usually have your INR checked every few days.  You may need to have INR tests done more than once a week until you are taking the correct dosage of warfarin.  After you have reached your target INR, your INR will be tested less often. However, you will need to have your INR checked at least once every 4-6 weeks for the entire time you are taking warfarin. What are the side effects of warfarin? Too much warfarin can cause bleeding (hemorrhage) in any part of the body, such as:  Bleeding from the gums.  Unexplained bruises.  Bruises that get larger.  Blood in the  urine.  Bloody or dark stools.  Bleeding in the brain (hemorrhagic stroke).  A nosebleed that is not easily stopped.  Coughing up blood.  Vomiting blood.  Warfarin use may also cause:  Skin rash or irritations  Nausea that does not go away.  Severe pain in the back or joints.  Painful toes that turn blue or purple (purple toe syndrome).  Painful ulcers that do not go away (skin necrosis).  What are the signs and symptoms of a blood clot? Too little warfarin can increase the risk of blood clots in your legs, lungs, or arms. Signs and symptoms of a DVT in your leg or arm may include:  Pain or swelling in your leg or arm.  Skin that is red or warm to the touch on your arm or leg.  Signs and symptoms of a pulmonary embolism may include:  Shortness of breath or difficulty breathing.  Chest pain.  Unexplained fever.  What are the signs and symptoms of a stroke? If you are taking too much  or too little warfarin, you can have a stroke. Signs and symptoms of a stroke may include:  Weakness or numbness of your face, arm, or leg, especially on one side of your body.  Confusion or trouble thinking clearly.  Difficulty seeing with one or both eyes.  Difficulty walking or moving your arms or legs.  Dizziness.  Loss of balance or coordination.  Trouble speaking, trouble understanding speech, or both (aphasia).  Sudden, severe headache with no known cause.  Partial or total loss of consciousness.  What precautions do I need to take while using warfarin?   Take warfarin exactly as told by your health care provider. Doing this helps you avoid bleeding or blood clots that could result in serious injury, pain, or disability.  Take your medicine at the same time every day. If you forget to take your dose of warfarin, take it as soon as you remember that day. If you do not remember on that day, do not take an extra dose the next day.  Contact your health care provider if  you miss or take an extra dose. Do not change your dosage on your own to make up for missed or extra doses.  Wear or carry identification that says that you are taking warfarin.  Make sure that all health care providers, including your dentist, know you are taking warfarin.  If you need surgery, talk with your health care provider about whether you should stop taking warfarin before your surgery.  Avoid situations that cause bleeding. You may bleed more easily while taking warfarin. To limit bleeding, take the following actions: ? Use a softer toothbrush. ? Floss with waxed floss, not unwaxed floss. ? Shave with an electric razor, not with a blade. ? Limit your use of sharp objects. ? Avoid potentially harmful activities, such as contact sports. What do I need to know about warfarin and pregnancy or breastfeeding?  Warfarin is not recommended during the first trimester of pregnancy due to an increased risk of birth defects. In certain situations, a woman may take warfarin after her first trimester of pregnancy.  If you are taking warfarin and you become pregnant or plan to become pregnant, contact your health care provider right away.  If you plan to breastfeed while taking warfarin, talk with your health care provider first. What do I need to know about warfarin and alcohol or drug use?  Avoid drinking alcohol, or limit alcohol intake to no more than 1 drink a day for nonpregnant women and 2 drinks a day for men. One drink equals 12 oz of beer, 5 oz of wine, or 1 oz of hard liquor. ? If you change the amount of alcohol that you drink, tell your health care provider. Your warfarin dosage may need to be changed.  Avoid tobacco products, such as cigarettes, chewing tobacco, and e-cigarettes. If you need help quitting, ask your health care provider. ? If you change the amount of nicotine or tobacco that you use, tell your health care provider. Your warfarin dosage may need to be  changed.  Avoid street drugs while taking warfarin. The effects of street drugs on warfarin are not known. What do I need to know about warfarin and other medicines or supplements?  Many prescription and over-the-counter medicines can interfere with warfarin. Talk with your health care provider or your pharmacist before starting or stopping any new medicines. This includes over-the-counter vitamins, dietary supplements, herbal medicines, and pain medicines. Your warfarin dosage may need to be  adjusted.  Some common over-the-counter medicines that may increase the risk of bleeding while taking warfarin include: ? Acetaminophen. ? Aspirin ? NSAIDs, such as ibuprofen or naproxen. ? Vitamin E. What do I need to know about warfarin and my diet?  It is important to maintain a normal, balanced diet while taking warfarin. Avoid major changes in your diet. If you are going to change your diet, talk with your health care provider before making changes.  Your health care provider may recommend that you work with a diet and nutrition specialist (dietitian).  Vitamin K decreases the effect of warfarin, and it is found in many foods. Eat a consistent amount of foods that contain vitamin K. For example, you may decide to eat 2 vitamin K-containing foods each day. Most foods that are high in vitamin K are green and leafy. Common foods that contain high amounts of vitamin K include:  Kale, raw or cooked.  Spinach, raw or cooked.  Collards, raw or cooked.  Swiss chard, raw or cooked.  Mustard greens, raw or cooked.  Turnip greens, raw or cooked.  Parsley, raw.  Broccoli, cooked.  Noodles, eggs, and spinach, enriched.  Brussels sprouts, raw or cooked.  Beet greens, raw or cooked.  Endive, raw.  Cabbage, cooked.  Asparagus, cooked.  Foods that contain moderate amounts of vitamin K include:  Broccoli, raw.  Cabbage, raw.  Bok choy, cooked.  Green leaf lettuce, raw  Prunes,  stewed.  Angie Fava.  Kiwi.  Edamame, cooked.  Romaine lettuce, raw.  Avocado.  Tuna, canned in oil.  Okra, cooked.  Black-eyed peas, cooked.  Green beans, cooked or raw.  Blueberries, raw.  Blackberries, raw.  Peas, cooked or raw.  Contact a health care provider if:  You miss a dose.  You take an extra dose.  You plan to have any kind of surgery or procedure.  You are unable to take your medicine due to nausea, vomiting, or diarrhea.  You have any major changes in your diet or you plan to make any major changes in your diet.  You start or stop any over-the-counter medicine, prescription medicine, or dietary supplement.  You become pregnant, plan to become pregnant, or think you may be pregnant.  You have menstrual periods that are heavier than usual.  You have unusual bruising. Get help right away if:  You develop symptoms of an allergic reaction, such as: ? Swelling of the lips, face, tongue, mouth, or throat. ? Rash. ? Itching. ? Itchy, red, swollen areas of skin (hives). ? Trouble breathing. ? Chest tightness.  You have: ? Signs or symptoms of a stroke. ? Signs or symptoms of a blood clot. ? A fall or have an accident, especially if you hit your head. ? Blood in your urine. Your urine may look reddish, pinkish, or tea-colored. ? Blood in your stool. Your stool may be black or bright red. ? Bleeding that does not stop after applying pressure to the area for 30 minutes. ? Severe pain in your joints or back. ? Purple or blue toes. ? Skin ulcers that do not go away.  You vomit blood or cough up blood. The blood may be bright red, or it may look like coffee grounds. These symptoms may represent a serious problem that is an emergency. Do not wait to see if the symptoms will go away. Get medical help right away. Call your local emergency services (911 in the [place] Do not drive yourself to the hospital. Summary  Warfarin  needs to be closely monitored  with blood tests. It is very important to keep all lab visits and follow-up visits with your health care provider.  Make sure that you know your target INR range and your warfarin dosage.  Wear or carry identification that says that you are taking warfarin.  Take warfarin at the same time every day. Call your health care provider if you miss a dose or if you take an extra dose. Do not change the dosage of warfarin on your own.  Know the signs and symptoms of blood clots, bleeding, and a stroke. Know when to get emergency medical help.  Tell all health care providers who care for you that you are taking warfarin.  Talk with your health care provider or your pharmacist before starting or stopping any new medicines.  Monitor how much vitamin K you eat every day. Try to eat the same amount every day. This information is not intended to replace advice given to you by your health care provider. Make sure you discuss any questions you have with your health care provider. Document Released: 03/16/2005 Document Revised: 11/26/2015 Document Reviewed: 06/12/2015 Elsevier Interactive Patient Education  2017 [employer]. The antibiotic we recommend using for dental work is Cleocin (is OK to take with Tikosyn).  Please follow up with your primary doctor for this when needed       You have an appointment set up with the [facility].  Multiple studies have shown that being followed by a dedicated atrial fibrillation clinic in addition to the standard care you receive from your other physicians improves health. We believe that enrollment in the atrial fibrillation clinic will allow [place] to better care for you.   The phone number to the [facility] is [phone]. The clinic is staffed Monday through Friday from 8:30am to 5pm.  Parking Directions: The clinic is located in the Heart and Vascular Building connected to [facility]. 1)From [address] turn on to  [address] and go to the 3rd entrance  (Heart and Vascular entrance) on the right. 2)Look to the right for Heart &Vascular Parking Garage. 3)A code for the entrance is required please call the clinic to receive this.   4)Take the elevators to the 1st floor. Registration is in the room with the glass walls at the end of the hallway.  If you have any trouble parking or locating the clinic, please dont hesitate to call [phone].  Information on my medicine - Coumadin   (Warfarin)  Why was Coumadin prescribed for you? Coumadin was prescribed for you because you have a blood clot or a medical condition that can cause an increased risk of forming blood clots. Blood clots can cause serious health problems by blocking the flow of blood to the heart, lung, or brain. Coumadin can prevent harmful blood clots from forming. As a reminder your indication for Coumadin is:   Stroke Prevention Because Of Atrial Fibrillation  What test will check on my response to Coumadin? While on Coumadin (warfarin) you will need to have an INR test regularly to ensure that your dose is keeping you in the desired range. The INR (international normalized ratio) number is calculated from the result of the laboratory test called prothrombin time (PT).  If an INR APPOINTMENT HAS NOT ALREADY BEEN MADE FOR YOU please schedule an appointment to have this lab work done by your health care provider within 7 days. Your INR goal is usually a number between:  2 to 3 or your provider may give you a more narrow range like 2-2.5.  Ask your health care provider during an office visit what your goal INR is.  What  do you need to  know  About  COUMADIN? Take Coumadin (warfarin) exactly as prescribed by your healthcare provider about the same time each day.  DO NOT stop taking without talking to the doctor who prescribed the medication.  Stopping without other blood clot prevention medication to take the place of Coumadin may increase  your risk of developing a new clot or stroke.  Get refills before you run out.  What do you do if you miss a dose? If you miss a dose, take it as soon as you remember on the same day then continue your regularly scheduled regimen the next day.  Do not take two doses of Coumadin at the same time.  Important Safety Information A possible side effect of Coumadin (Warfarin) is an increased risk of bleeding. You should call your healthcare provider right away if you experience any of the following: ? Bleeding from an injury or your nose that does not stop. ? Unusual colored urine (red or dark brown) or unusual colored stools (red or black). ? Unusual bruising for unknown reasons. ? A serious fall or if you hit your head (even if there is no bleeding).  Some foods or medicines interact with Coumadin (warfarin) and might alter your response to warfarin. To help avoid this: ? Eat a balanced diet, maintaining a consistent amount of Vitamin K. ? Notify your provider about major diet changes you plan to make. ? Avoid alcohol or limit your intake to 1 drink for women and 2 drinks for men per day. (1 drink is 5 oz. wine, 12 oz. beer, or 1.5 oz. liquor.)  Make sure that ANY health care provider who prescribes medication for you knows that you are taking Coumadin (warfarin).  Also make sure the healthcare provider who is monitoring your Coumadin knows when you have started a new medication including herbals and non-prescription products.  Coumadin (Warfarin)  Major Drug Interactions  Increased Warfarin Effect Decreased Warfarin Effect  Alcohol (large quantities) Antibiotics (esp. Septra/Bactrim, Flagyl, Cipro) Amiodarone (Cordarone) Aspirin (ASA) Cimetidine (Tagamet) Megestrol (Megace) NSAIDs (ibuprofen, naproxen, etc.) Piroxicam (Feldene) Propafenone (Rythmol SR) Propranolol (Inderal) Isoniazid (INH) Posaconazole (Noxafil) Barbiturates (Phenobarbital) Carbamazepine (Tegretol) Chlordiazepoxide  (Librium) Cholestyramine (Questran) Griseofulvin Oral Contraceptives Rifampin Sucralfate (Carafate) Vitamin K   Coumadin (Warfarin) Major Herbal Interactions  Increased Warfarin Effect Decreased Warfarin Effect  Garlic Ginseng Ginkgo biloba Coenzyme Q10 Green tea St. Johns wort    Coumadin (Warfarin) FOOD Interactions  Eat a consistent number of servings per week of foods HIGH in Vitamin K (1 serving =  cup)  Collards (cooked, or boiled & drained) Kale (cooked, or boiled & drained) Mustard greens (cooked, or boiled & drained) Parsley *serving size only =  cup Spinach (cooked, or boiled & drained) Swiss chard (cooked, or boiled & drained) Turnip greens (cooked, or boiled & drained)  Eat a consistent number of servings per week of foods MEDIUM-HIGH in Vitamin K (1 serving = 1 cup)  Asparagus (cooked, or boiled & drained) Broccoli (cooked, boiled & drained, or raw & chopped) Brussel sprouts (cooked, or boiled & drained) *serving size only =  cup Lettuce, raw (green leaf, endive, romaine) Spinach, raw Turnip greens, raw & chopped   These websites have more information on Coumadin (warfarin):  [employer]se; VeganReport.com.au;

## 2017-08-27 NOTE — Progress Notes (Signed)
Per telemetry strip pt's QTc at 2300 was 451. CCMD will document in Epic and save strip

## 2017-08-27 NOTE — Progress Notes (Signed)
QT is stable Remains in sinus Plan discharge today with close follow-up in AF clinic.  Thompson Grayer MD, [facility] 08/27/2017 9:54 AM

## 2017-08-30 DIAGNOSIS — Z6838 Body mass index (BMI) 38.0-38.9, adult: Secondary | ICD-10-CM | POA: Diagnosis not present

## 2017-08-30 DIAGNOSIS — I10 Essential (primary) hypertension: Secondary | ICD-10-CM | POA: Diagnosis not present

## 2017-08-30 DIAGNOSIS — I4891 Unspecified atrial fibrillation: Secondary | ICD-10-CM | POA: Diagnosis not present

## 2017-08-30 DIAGNOSIS — Z299 Encounter for prophylactic measures, unspecified: Secondary | ICD-10-CM | POA: Diagnosis not present

## 2017-08-30 DIAGNOSIS — J449 Chronic obstructive pulmonary disease, unspecified: Secondary | ICD-10-CM | POA: Diagnosis not present

## 2017-09-01 ENCOUNTER — Ambulatory Visit (HOSPITAL_COMMUNITY)
Admit: 2017-09-01 | Discharge: 2017-09-01 | Disposition: A | Discharge status: Home / Self Care | Payer: Medicare Other | Attending: Nurse Practitioner | Admitting: Nurse Practitioner

## 2017-09-01 ENCOUNTER — Ambulatory Visit (HOSPITAL_COMMUNITY): Payer: Medicare Other | Admitting: Nurse Practitioner

## 2017-09-01 ENCOUNTER — Encounter (HOSPITAL_COMMUNITY): Payer: Self-pay | Admitting: Nurse Practitioner

## 2017-09-01 ENCOUNTER — Telehealth (HOSPITAL_COMMUNITY): Payer: Self-pay | Admitting: *Deleted

## 2017-09-01 ENCOUNTER — Other Ambulatory Visit: Payer: Self-pay

## 2017-09-01 VITALS — BP 124/76 | HR 49 | Ht 63.5 in | Wt 210.0 lb

## 2017-09-01 DIAGNOSIS — F419 Anxiety disorder, unspecified: Secondary | ICD-10-CM | POA: Insufficient documentation

## 2017-09-01 DIAGNOSIS — I4892 Unspecified atrial flutter: Secondary | ICD-10-CM | POA: Diagnosis not present

## 2017-09-01 DIAGNOSIS — Z800 Family history of malignant neoplasm of digestive organs: Secondary | ICD-10-CM | POA: Diagnosis not present

## 2017-09-01 DIAGNOSIS — Z8249 Family history of ischemic heart disease and other diseases of the circulatory system: Secondary | ICD-10-CM | POA: Insufficient documentation

## 2017-09-01 DIAGNOSIS — M9903 Segmental and somatic dysfunction of lumbar region: Secondary | ICD-10-CM | POA: Diagnosis not present

## 2017-09-01 DIAGNOSIS — E669 Obesity, unspecified: Secondary | ICD-10-CM | POA: Diagnosis not present

## 2017-09-01 DIAGNOSIS — Z9889 Other specified postprocedural states: Secondary | ICD-10-CM | POA: Insufficient documentation

## 2017-09-01 DIAGNOSIS — G4733 Obstructive sleep apnea (adult) (pediatric): Secondary | ICD-10-CM | POA: Diagnosis not present

## 2017-09-01 DIAGNOSIS — I10 Essential (primary) hypertension: Secondary | ICD-10-CM | POA: Insufficient documentation

## 2017-09-01 DIAGNOSIS — I481 Persistent atrial fibrillation: Secondary | ICD-10-CM

## 2017-09-01 DIAGNOSIS — M4326 Fusion of spine, lumbar region: Secondary | ICD-10-CM | POA: Insufficient documentation

## 2017-09-01 DIAGNOSIS — Z7901 Long term (current) use of anticoagulants: Secondary | ICD-10-CM | POA: Insufficient documentation

## 2017-09-01 DIAGNOSIS — E785 Hyperlipidemia, unspecified: Secondary | ICD-10-CM | POA: Diagnosis not present

## 2017-09-01 DIAGNOSIS — Z79899 Other long term (current) drug therapy: Secondary | ICD-10-CM | POA: Insufficient documentation

## 2017-09-01 DIAGNOSIS — M542 Cervicalgia: Secondary | ICD-10-CM | POA: Diagnosis not present

## 2017-09-01 DIAGNOSIS — Z888 Allergy status to other drugs, medicaments and biological substances status: Secondary | ICD-10-CM | POA: Diagnosis not present

## 2017-09-01 DIAGNOSIS — M546 Pain in thoracic spine: Secondary | ICD-10-CM | POA: Diagnosis not present

## 2017-09-01 DIAGNOSIS — Z880 Allergy status to penicillin: Secondary | ICD-10-CM | POA: Diagnosis not present

## 2017-09-01 DIAGNOSIS — J449 Chronic obstructive pulmonary disease, unspecified: Secondary | ICD-10-CM | POA: Diagnosis not present

## 2017-09-01 DIAGNOSIS — Z96641 Presence of right artificial hip joint: Secondary | ICD-10-CM | POA: Insufficient documentation

## 2017-09-01 DIAGNOSIS — M9901 Segmental and somatic dysfunction of cervical region: Secondary | ICD-10-CM | POA: Diagnosis not present

## 2017-09-01 DIAGNOSIS — I480 Paroxysmal atrial fibrillation: Secondary | ICD-10-CM | POA: Diagnosis not present

## 2017-09-01 DIAGNOSIS — F329 Major depressive disorder, single episode, unspecified: Secondary | ICD-10-CM | POA: Diagnosis not present

## 2017-09-01 DIAGNOSIS — M545 Low back pain: Secondary | ICD-10-CM | POA: Diagnosis not present

## 2017-09-01 DIAGNOSIS — M9905 Segmental and somatic dysfunction of pelvic region: Secondary | ICD-10-CM | POA: Diagnosis not present

## 2017-09-01 DIAGNOSIS — M9902 Segmental and somatic dysfunction of thoracic region: Secondary | ICD-10-CM | POA: Diagnosis not present

## 2017-09-01 DIAGNOSIS — I4819 Other persistent atrial fibrillation: Secondary | ICD-10-CM

## 2017-09-01 LAB — BASIC METABOLIC PANEL
Anion gap: 7 (ref 5–15)
BUN: 14 mg/dL (ref 6–20)
CALCIUM: 9.1 mg/dL (ref 8.9–10.3)
CO2: 27 mmol/L (ref 22–32)
Chloride: 109 mmol/L (ref 101–111)
Creatinine, Ser: 0.99 mg/dL (ref 0.44–1.00)
GFR calc Af Amer: >60 mL/min (ref >60)
GFR, EST NON AFRICAN AMERICAN: 54 mL/min — AB (ref >60)
GLUCOSE: 96 mg/dL (ref 65–99)
POTASSIUM: 4.1 mmol/L (ref 3.5–5.1)
SODIUM: 143 mmol/L (ref 135–145)

## 2017-09-01 LAB — MAGNESIUM: MAGNESIUM: 1.8 mg/dL (ref 1.7–2.4)

## 2017-09-01 NOTE — Progress Notes (Signed)
Primary Care Physician: Monico Blitz, MD Referring Physician: Dr. [name] is a 77 y.o. female with a h/o COPD, HTN, OSA, atrial flutter and paroxysmal afib tht has been persistent for several weeks. She was seen  the afib clinic for admission for Tikosyn. She has now failed flecainide, many cardioversion's in the past, has been off drug since Friday. She has not had the 4 weekly therapaeutic INR's and will have an INR and if in range TEE prior to admission. No benadryl use. She is no longer on HCTZ.  F/u in afib clinic one week after Tikosyn loading. She is Sinus brady today at HR of 49 bpm, but at home HR ins in the 50's  Which is her norm. She feels improved on tikosyn being  in rhythm.  Today, she denies symptoms of palpitations, chest pain, shortness of breath, orthopnea, PND, lower extremity edema, dizziness, presyncope, syncope, or neurologic sequela. The patient is tolerating medications without difficulties and is otherwise without complaint today.   Past Medical History:  Diagnosis Date  . Allergic rhinitis   . Anxiety   . Atrial flutter ([place])   . COPD (chronic obstructive pulmonary disease) ([place])   . Depression   . Essential hypertension   . Hyperlipidemia   . Lumbar disc disease   . Obesity   . OSA (obstructive sleep apnea)    CPAP  . Paroxysmal atrial fibrillation ([facility])    Element of tachycardia bradycardia syndrome  . Respiratory failure [facility])    Past Surgical History:  Procedure Laterality Date  . ANTERIOR FUSION LUMBAR SPINE    . BIOPSY  08/27/2016   Procedure: BIOPSY;  Surgeon: Rogene Houston, MD;  [facility] ENDO [facility];  Service: Endoscopy;;  FOUR GASTRIC POLYPS BIOPSIED  . CARDIOVERSION N/A 03/28/2014   Procedure: CARDIOVERSION;  Surgeon: Fay Records, MD;  Location: [facility] ORS;  Service: Cardiovascular;  Laterality: N/A;  . CARDIOVERSION N/A 10/22/2016   Procedure: CARDIOVERSION;  Surgeon: Sueanne Margarita, MD;  Location: [facility] ENDOSCOPY;   Service: Cardiovascular;  Laterality: N/A;  . CATARACT EXTRACTION    . COLONOSCOPY N/A 08/04/2012   Procedure: COLONOSCOPY;  Surgeon: Rogene Houston, MD;  Location: [facility];  Service: Endoscopy;  Laterality: N/A;  730-rescheduled to [place] notified pt  . ELECTROPHYSIOLOGIC STUDY N/A 09/18/2014   Procedure: Atrial Fibrillation Ablation;  Surgeon: Thompson Grayer, MD;  Location: [place] CV LAB;  Service: Cardiovascular;  Laterality: N/A;  . ESOPHAGOGASTRODUODENOSCOPY N/A 08/27/2016   Procedure: ESOPHAGOGASTRODUODENOSCOPY (EGD);  Surgeon: Rogene Houston, MD;  [facility];  Service: Endoscopy;  Laterality: N/A;  1200  . LASIK     Both eyes  . TEE WITHOUT CARDIOVERSION N/A 09/18/2014   Procedure: TRANSESOPHAGEAL ECHOCARDIOGRAM (TEE);  Surgeon: Larey Dresser, MD;  Location: [place];  Service: Cardiovascular;  Laterality: N/A;  . TEE WITHOUT CARDIOVERSION N/A 10/22/2016   Procedure: TRANSESOPHAGEAL ECHOCARDIOGRAM (TEE);  Surgeon: Sueanne Margarita, MD;  Location: [facility] ENDOSCOPY;  Service: Cardiovascular;  Laterality: N/A;  . TEE WITHOUT CARDIOVERSION N/A 08/24/2017   Procedure: TRANSESOPHAGEAL ECHOCARDIOGRAM (TEE);  Surgeon: Josue Hector, MD;  Location: [facility] ENDOSCOPY;  Service: Cardiovascular;  Laterality: N/A;  . TOTAL HIP ARTHROPLASTY  2010   Right    Current Outpatient Medications  Medication Sig Dispense Refill  . acetaminophen (TYLENOL) 500 MG tablet Take 500 mg by mouth daily as needed for headache.    Marland Kitchen antiseptic oral rinse (BIOTENE) LIQD 15 mLs by Mouth  Rinse route as needed for dry mouth.    Marland Kitchen atorvastatin (LIPITOR) 10 MG tablet Take 10 mg by mouth every evening.     . baclofen (LIORESAL) 10 MG tablet Take 10 mg by mouth as needed for muscle spasms.    . Biotin 5000 MCG CAPS Take 5,000 mcg by mouth daily.    . budesonide (PULMICORT) 0.5 MG/2ML nebulizer solution Take 0.5 mg by nebulization 2 (two) times daily.     . cetirizine (ZYRTEC) 10 MG tablet Take 10 mg by  mouth daily as needed for allergies.    . cycloSPORINE (RESTASIS) 0.05 % ophthalmic emulsion Place 1 drop 2 (two) times daily into both eyes.     Marland Kitchen diltiazem (CARTIA XT) 120 MG 24 hr capsule Take 120 mg by mouth at bedtime.     . dofetilide (TIKOSYN) 500 MCG capsule Take 1 capsule (500 mcg total) by mouth 2 (two) times daily. 60 capsule 6  . fluticasone (FLONASE) 50 MCG/ACT nasal spray Place 1 spray into both nostrils 2 (two) times daily.    . Fluticasone-Umeclidin-Vilant (TRELEGY ELLIPTA) 100-62.5-25 MCG/INH AEPB Inhale 1 puff into the lungs daily. Then rinse mouth, once daily 60 each 12  . Hypromellose (ARTIFICIAL TEARS OP) Apply 1 drop to eye every 4 (four) hours as needed (dry eyes).     Marland Kitchen ipratropium (ATROVENT) 0.06 % nasal spray Place 2 sprays into both nostrils 4 (four) times daily as needed for rhinitis.     Marland Kitchen ipratropium-albuterol (DUONEB) 0.5-2.5 (3) MG/3ML SOLN Take 3 mLs by nebulization 2 (two) times daily.     Marland Kitchen losartan (COZAAR) 100 MG tablet Take 1 tablet (100 mg total) by mouth daily. 30 tablet 3  . Mepolizumab (NUCALA [place]) Inject into the skin every 30 (thirty) days.    . montelukast (SINGULAIR) 10 MG tablet Take 10 mg by mouth at bedtime.    Marland Kitchen omeprazole (PRILOSEC) 20 MG capsule Take 20 mg by mouth daily.    . potassium chloride SA (K-DUR,KLOR-CON) 20 MEQ tablet Take 1 tablet (20 mEq total) daily by mouth. 30 tablet 6  . sodium chloride (OCEAN) 0.65 % SOLN nasal spray Place 1 spray into both nostrils 2 (two) times daily.     Marland Kitchen triamcinolone ointment (KENALOG) 0.1 % Apply 1 application topically daily as needed for rash.  2  . umeclidinium-vilanterol (ANORO ELLIPTA) 62.5-25 MCG/INH AEPB Inhale 1 puff into the lungs daily. 2 each 0  . warfarin (COUMADIN) 5 MG tablet Take 1 tablet (5 mg total) by mouth daily. (Patient taking differently: Take 2.5-5 mg by mouth every evening. Take 2.5 mg daily on Mon and Fri, then take 5mg s daily on Tues, Wed, Thur, Sat, and Sun) 45 tablet 3  . Wheat  Dextrin (BENEFIBER ON THE GO) POWD Take 1 Dose by mouth daily as needed (constipation).     . clindamycin (CLEOCIN) 300 MG capsule 2 capsules as needed. Take prior to dental procedures  0   Current Facility-Administered Medications  Medication Dose Route Frequency Provider Last Rate Last Dose  . Mepolizumab SOLR 100 mg  100 mg Subcutaneous Q28 days Baird Lyons D, MD   100 mg at 08/20/17 0850    Allergies  Allergen Reactions  . Bupropion Hcl Other (See Comments)    Suicidal Thoughts.   . Escitalopram Oxalate Other (See Comments)    Suicidal Thoughts.   . Fluticasone-Salmeterol Other (See Comments)    Advair - Caused patient to go into Afib.   Marland Kitchen Lisinopril Cough  .  Serevent Other (See Comments)    Caused patient to go into Afib  . Macrodantin [Nitrofurantoin] Rash  . Penicillins Rash and Other (See Comments)    Childhood allergy  Has patient had a PCN reaction causing immediate rash, facial/tongue/throat swelling, SOB or lightheadedness with hypotension: Unknown Has patient had a PCN reaction causing severe rash involving mucus membranes or skin necrosis: Unknown Has patient had a PCN reaction that required hospitalization No Has patient had a PCN reaction occurring within the last 10 years: No If all of the above answers are "NO", then may proceed with Cephalosporin use.     Social History   Socioeconomic History  . Marital status: Single    Spouse name: Not on file  . Number of children: Not on file  . Years of education: Not on file  . Highest education level: Not on file  Occupational History  . Occupation: Retired: Presenter, broadcasting  Social Needs  . Financial resource strain: Not on file  . Food insecurity:    Worry: Not on file    Inability: Not on file  . Transportation needs:    Medical: Not on file    Non-medical: Not on file  Tobacco Use  . Smoking status: Never Smoker  . Smokeless tobacco: Never Used  Substance and Sexual Activity  . Alcohol use: No     Alcohol/week: 0.0 oz  . Drug use: No  . Sexual activity: Not on file  Lifestyle  . Physical activity:    Days per week: Not on file    Minutes per session: Not on file  . Stress: Not on file  Relationships  . Social connections:    Talks on phone: Not on file    Gets together: Not on file    Attends religious service: Not on file    Active member of club or organization: Not on file    Attends meetings of clubs or organizations: Not on file    Relationship status: Not on file  . Intimate partner violence:    Fear of current or ex partner: Not on file    Emotionally abused: Not on file    Physically abused: Not on file    Forced sexual activity: Not on file  Other Topics Concern  . Not on file  Social History Narrative   Pt lives in [place] alone. She was never married.   Retired Customer service manager.   Attends [employer]    Family History  Problem Relation Age of Onset  . Cancer Mother   . Heart attack Father   . Colon cancer Other     ROS- All systems are reviewed and negative except as per the HPI above  Physical Exam: Vitals:   09/01/17 1136  BP: 124/76  Pulse: (!) 49  Weight: 210 lb (95.3 kg)  Height: 5' 3.5" (1.613 m)   Wt Readings from Last 3 Encounters:  09/01/17 210 lb (95.3 kg)  08/27/17 206 lb 14.4 oz (93.8 kg)  08/24/17 208 lb 9.6 oz (94.6 kg)    Labs: Lab Results  Component Value Date   NA 143 09/01/2017   K 4.1 09/01/2017   CL 109 09/01/2017   CO2 27 09/01/2017   GLUCOSE 96 09/01/2017   BUN 14 09/01/2017   CREATININE 0.99 09/01/2017   CALCIUM 9.1 09/01/2017   MG 1.8 09/01/2017   Lab Results  Component Value Date   INR 2.59 08/27/2017   No results found for: CHOL, HDL, LDLCALC, TRIG  GEN- The patient is well appearing, alert and oriented x 3 today.   Head- normocephalic, atraumatic Eyes-  Sclera clear, conjunctiva pink Ears- hearing intact Oropharynx- clear Neck- supple, no JVP Lymph- no cervical  lymphadenopathy Lungs- Clear to ausculation bilaterally, normal work of breathing Heart- regular rate and rhythm, no murmurs, rubs or gallops, PMI not laterally displaced GI- soft, NT, ND, + BS Extremities- no clubbing, cyanosis, or edema MS- no significant deformity or atrophy Skin- no rash or lesion Psych- euthymic mood, full affect Neuro- strength and sensation are intact  EKG-  Sinus rhythm at 49 bpm, pr int 192 ms, qrs int 84 ms, qtc 444 ms (stable)     Assessment and Plan: 1. Persistent  afib Has failed flecainide General precautions with Tikosyn discussed  Now loaded on Tikosyn at 500 mcg bid and in SR Has qualified for drug assistance  with dofetilide  Continue warfarin for chadsvasc score of 4 Continue Cartia XT , but watch HR and if persistently  in the 40's or symptomatic with brady, notify office   Butch Penny C. Loney Peto, [facility] [address] [place], [place] [address] [phone]

## 2017-09-01 NOTE — Telephone Encounter (Signed)
Pt was approved for tikosyn assistance program. Patient ID #[id number] valid through 03/29/18

## 2017-09-06 DIAGNOSIS — M9901 Segmental and somatic dysfunction of cervical region: Secondary | ICD-10-CM | POA: Diagnosis not present

## 2017-09-06 DIAGNOSIS — M9905 Segmental and somatic dysfunction of pelvic region: Secondary | ICD-10-CM | POA: Diagnosis not present

## 2017-09-06 DIAGNOSIS — M9903 Segmental and somatic dysfunction of lumbar region: Secondary | ICD-10-CM | POA: Diagnosis not present

## 2017-09-06 DIAGNOSIS — M546 Pain in thoracic spine: Secondary | ICD-10-CM | POA: Diagnosis not present

## 2017-09-06 DIAGNOSIS — M9902 Segmental and somatic dysfunction of thoracic region: Secondary | ICD-10-CM | POA: Diagnosis not present

## 2017-09-06 DIAGNOSIS — M545 Low back pain: Secondary | ICD-10-CM | POA: Diagnosis not present

## 2017-09-06 DIAGNOSIS — M542 Cervicalgia: Secondary | ICD-10-CM | POA: Diagnosis not present

## 2017-09-13 ENCOUNTER — Other Ambulatory Visit: Payer: Self-pay | Admitting: *Deleted

## 2017-09-13 MED ORDER — LOSARTAN POTASSIUM 100 MG PO TABS: 100.0000 mg | ORAL_TABLET | Freq: Every day | ORAL | 2 refills | Status: DC

## 2017-09-13 NOTE — Telephone Encounter (Signed)
Pharmacy requesting a ninety day supply.  

## 2017-09-14 ENCOUNTER — Telehealth: Payer: Self-pay | Admitting: Internal Medicine

## 2017-09-14 MED ORDER — PREDNISONE 10 MG PO TABS: ORAL_TABLET | ORAL | 0 refills | Status: DC

## 2017-09-14 MED ORDER — FLUTICASONE-UMECLIDIN-VILANT 100-62.5-25 MCG/INH IN AEPB: 1.0000 | INHALATION_SPRAY | Freq: Every day | RESPIRATORY_TRACT | 12 refills | Status: DC

## 2017-09-14 NOTE — Telephone Encounter (Signed)
Attempted to call pt but unable to reach her.  Left message for pt to return call x1  

## 2017-09-14 NOTE — Telephone Encounter (Signed)
Try prednisone taper starting at 40 mg. Reduce dose by 10 mg every 3 days

## 2017-09-14 NOTE — Telephone Encounter (Signed)
Called and spoke with pt letting her know we were sending a script of pred taper to her pharmacy for her.  Pt expressed understanding. Verified pt's pharmacy and sent script in. Nothing further needed.

## 2017-09-14 NOTE — Telephone Encounter (Signed)
Spoke with pt. States that she is not feeling well. Reports increased cough and wheezing. Cough is producing clear mucus. Denies chest tightness, SOB or fever. Symptoms started 2 days ago. While on the phone, she requested to have a prescription sent in for Trelegy. This has been sent in.  CY - please advise. Thanks.

## 2017-09-14 NOTE — Telephone Encounter (Signed)
Message will routed to DOD as Dr. Annamaria Boots as already left for the day.

## 2017-09-14 NOTE — Telephone Encounter (Signed)
Patient returned call, CB is [phone].

## 2017-09-16 ENCOUNTER — Ambulatory Visit (INDEPENDENT_AMBULATORY_CARE_PROVIDER_SITE_OTHER): Payer: Medicare Other | Admitting: Pulmonary Disease

## 2017-09-16 ENCOUNTER — Telehealth: Payer: Self-pay | Admitting: Internal Medicine

## 2017-09-16 ENCOUNTER — Encounter: Payer: Self-pay | Admitting: Pulmonary Disease

## 2017-09-16 VITALS — BP 130/84 | HR 62 | Ht 63.0 in | Wt 207.4 lb

## 2017-09-16 DIAGNOSIS — Z79899 Other long term (current) drug therapy: Secondary | ICD-10-CM

## 2017-09-16 DIAGNOSIS — Z5181 Encounter for therapeutic drug level monitoring: Secondary | ICD-10-CM

## 2017-09-16 DIAGNOSIS — J4541 Moderate persistent asthma with (acute) exacerbation: Secondary | ICD-10-CM

## 2017-09-16 DIAGNOSIS — G4733 Obstructive sleep apnea (adult) (pediatric): Secondary | ICD-10-CM

## 2017-09-16 MED ORDER — IPRATROPIUM-ALBUTEROL 0.5-2.5 (3) MG/3ML IN SOLN: 3.0000 mL | RESPIRATORY_TRACT | Status: AC

## 2017-09-16 MED ORDER — METHYLPREDNISOLONE 8 MG PO TABS: 8.0000 mg | ORAL_TABLET | Freq: Every day | ORAL | 0 refills | Status: DC

## 2017-09-16 MED ORDER — HYDROCODONE-HOMATROPINE 5-1.5 MG/5ML PO SYRP: 5.0000 mL | ORAL_SOLUTION | Freq: Four times a day (QID) | ORAL | 0 refills | Status: DC | PRN

## 2017-09-16 MED ORDER — METHYLPREDNISOLONE ACETATE 80 MG/ML IJ SUSP
80.0000 mg | Freq: Once | INTRAMUSCULAR | Status: AC
  Administered 2017-09-16: 80 mg via INTRAMUSCULAR

## 2017-09-16 NOTE — Progress Notes (Signed)
@Patient  ID: [name] [name], female    DOB: 1940-09-09, 77 y.o.   MRN: [id number]  Chief Complaint  Patient presents with  . Acute Visit    Wheezing sob for since 09/12/17, upset stomach on sunday with diarrhea and has taken a duo neb this morning. Just started Tikosyn. Started pred taper 6/18 and still not feeling better. Patient recently had her roof done and reports she thinks the residue has caused her flare up. Pulmicort neb this morning, anoro and trelegy daily.     Referring provider: Monico Blitz, MD   HPI 79 female never smoker followed for OSA, allergic rhinitis, asthma, complicated by A. Fib/Coumadin, HBP, GERD, obesity   Recent [facility] Encounters:   07/20/2017-  77 year old female never smoker followed for OSA, allergic rhinitis, asthma/ Nucala, complicated by A. Fib/Coumadin, HBP, GERD, obesity CPAP 9/Advanced ----OSA and Asthma; Pt states she is having a hard time breathing- ? allergies or asthma. Increased cough, wheezing, and SOB-was given pred taper and pred shot as well.  Reports the third episode of wheezing dyspnea at this year.  Current episode began a week ago with nasal congestion, chest tightness, wheeze but no sore throat, purulent sputum or fever.  She had been riding around in a garden with open windows and blames pollen.  Has now been on Nucala for 5 months, well-tolerated, but not sure its effective. Dr. Brigitte Pulse had given prednisone taper, erythromycin a week ago, now finished.  Continues Anoro, Singulair and her nebulizer with DuoNeb/ Pulmicort.  08/20/2017- 77 year old female never smoker followed for OSA, allergic rhinitis, asthma/ Nucala, complicated by A. Fib/Coumadin, HBP, GERD, obesity CPAP 9/Advanced Nucala  Anoro, Singulair, Neb Duoneb/ Pulmicort  ----Cpap is going well no problems at this time. Coughing is bad right now  somtimes dry and at other times productive.  Coughing had been very much better for some months but recently worse again  for unknown reasons.  She admits pollen might be a factor.  Does worse if lying back in recliner.  Occasional scant sputum is usually clear.  Often has no problems when lying down.  Cold air and cold drinks tend to trigger and she always feels the source in her upper trachea or throat area.  Using her nebulizer DuoNeb about twice most days.  She does not recognize reflux or heartburn. Pending another cardioversion.     Tests:   NPSG prior to EMR in 2012 Office Spirometry 09/02/16-WNL. FVC 2.06/76%, FEV1 1.57/77%, ratio 0.76, FEF 25-75% 1.26/80% Allergy labs 09/02/16- eosinophils 200, total IgE 4230 causing elevation of all specific allergen antibody levels including Aspergillus. Nucala  + 2018 PFT-10/13/16-minimal obstruction without response to dilator, minimal reduction of diffusion. FVC 2.02/77%, FEV1 1.62/83%, ratio 0.80, TLC 92%, DLCO 77% CT soft tissue neck 09/22/16-1. 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 Subsegmental atelectasis and nonspecific linear patchy densities in the lungs as described. They have a benign appearance.  Small hyperdense masses in the mediastinum are stable compared with 2015 supporting benign etiology such as ectopic thyroid tissue. Three-vessel prominent coronary artery calcification. Bibasilar bronchiolectasis. -------------------------------------------------------------------------------------------   09/16/17  Acute   Pleasant 77 year old patient seen in office today unfortunately patient is having 4 days of shortness of breath and wheezing.  Patient reports that the symptoms started to occur after she was exposed to a significant amount of dust as well as kills solution at her house.  Patient also reporting that on Sunday prior to symptoms starting she ate  a large [employer] meal which exacerbated her GERD as well as upset her stomach.  Patient reporting nausea no vomiting after that instance  which has resolved as of 6/17.  Since then patient has had continued respiratory problems.  Patient called our office and was started on a prednisone taper.  Patient is on her second day of that taper today.  Patient is on Tikosyn which she called to notify the [facility]. fib clinic that she was starting the prednisone taper.   Allergies  Allergen Reactions  . Bupropion Hcl Other (See Comments)    Suicidal Thoughts.   . Escitalopram Oxalate Other (See Comments)    Suicidal Thoughts.   . Fluticasone-Salmeterol Other (See Comments)    Advair - Caused patient to go into Afib.   Marland Kitchen Lisinopril Cough  . Serevent Other (See Comments)    Caused patient to go into Afib  . Macrodantin [Nitrofurantoin] Rash  . Penicillins Rash and Other (See Comments)    Childhood allergy  Has patient had a PCN reaction causing immediate rash, facial/tongue/throat swelling, SOB or lightheadedness with hypotension: Unknown Has patient had a PCN reaction causing severe rash involving mucus membranes or skin necrosis: Unknown Has patient had a PCN reaction that required hospitalization No Has patient had a PCN reaction occurring within the last 10 years: No If all of the above answers are "NO", then may proceed with Cephalosporin use.     Immunization History  Administered Date(s) Administered  . Influenza Split 12/29/2010, 12/28/2012, 12/19/2013, 12/01/2016  . Influenza Whole 12/12/2011  . Influenza,inj,Quad PF,6+ Mos 12/31/2015  . Influenza-Unspecified 01/04/2015  . Pneumococcal Conjugate-13 12/19/2013  . Pneumococcal Polysaccharide-23 05/07/2006  . Td 05/13/2009  . Zoster 12/15/2010    Past Medical History:  Diagnosis Date  . Allergic rhinitis   . Anxiety   . Atrial flutter ([place])   . COPD (chronic obstructive pulmonary disease) ([place])   . Depression   . Essential hypertension   . Hyperlipidemia   . Lumbar disc disease   . Obesity   . OSA (obstructive sleep apnea)    CPAP  . Paroxysmal atrial  fibrillation ([facility])    Element of tachycardia bradycardia syndrome  . Respiratory failure ([place])     Tobacco History: Social History   Tobacco Use  Smoking Status Never Smoker  Smokeless Tobacco Never Used   Counseling given: Yes Continue not smoking  Outpatient Encounter Medications as of 09/16/2017  Medication Sig  . acetaminophen (TYLENOL) 500 MG tablet Take 500 mg by mouth daily as needed for headache.  Marland Kitchen antiseptic oral rinse (BIOTENE) LIQD 15 mLs by Mouth Rinse route as needed for dry mouth.  Marland Kitchen atorvastatin (LIPITOR) 10 MG tablet Take 10 mg by mouth every evening.   . baclofen (LIORESAL) 10 MG tablet Take 10 mg by mouth as needed for muscle spasms.  . Biotin 5000 MCG CAPS Take 5,000 mcg by mouth daily.  . budesonide (PULMICORT) 0.5 MG/2ML nebulizer solution Take 0.5 mg by nebulization 2 (two) times daily.   . cetirizine (ZYRTEC) 10 MG tablet Take 10 mg by mouth daily as needed for allergies.  . clindamycin (CLEOCIN) 300 MG capsule 2 capsules as needed. Take prior to dental procedures  . cycloSPORINE (RESTASIS) 0.05 % ophthalmic emulsion Place 1 drop 2 (two) times daily into both eyes.   Marland Kitchen diltiazem (CARTIA XT) 120 MG 24 hr capsule Take 120 mg by mouth at bedtime.   . dofetilide (TIKOSYN) 500 MCG capsule Take 1 capsule (500 mcg total) by mouth  2 (two) times daily.  . fluticasone (FLONASE) 50 MCG/ACT nasal spray Place 1 spray into both nostrils 2 (two) times daily.  . Fluticasone-Umeclidin-Vilant (TRELEGY ELLIPTA) 100-62.5-25 MCG/INH AEPB Inhale 1 puff into the lungs daily.  . Hypromellose (ARTIFICIAL TEARS OP) Apply 1 drop to eye every 4 (four) hours as needed (dry eyes).   Marland Kitchen ipratropium (ATROVENT) 0.06 % nasal spray Place 2 sprays into both nostrils 4 (four) times daily as needed for rhinitis.   Marland Kitchen ipratropium-albuterol (DUONEB) 0.5-2.5 (3) MG/3ML SOLN Take 3 mLs by nebulization 2 (two) times daily.   Marland Kitchen losartan (COZAAR) 100 MG tablet Take 1 tablet (100 mg total) by mouth daily.    . Mepolizumab (NUCALA [place]) Inject into the skin every 30 (thirty) days.  . montelukast (SINGULAIR) 10 MG tablet Take 10 mg by mouth at bedtime.  Marland Kitchen omeprazole (PRILOSEC) 20 MG capsule Take 20 mg by mouth daily.  . potassium chloride SA (K-DUR,KLOR-CON) 20 MEQ tablet Take 1 tablet (20 mEq total) daily by mouth.  . predniSONE (DELTASONE) 10 MG tablet Take 40mg x3days,30mg x3days,20mg x3days,10mg x3days,then stop  . sodium chloride (OCEAN) 0.65 % SOLN nasal spray Place 1 spray into both nostrils 2 (two) times daily.   Marland Kitchen triamcinolone ointment (KENALOG) 0.1 % Apply 1 application topically daily as needed for rash.  . warfarin (COUMADIN) 5 MG tablet Take 1 tablet (5 mg total) by mouth daily. (Patient taking differently: Take 2.5-5 mg by mouth every evening. Take 2.5 mg daily on Mon and Fri, then take 5mg s daily on Tues, Wed, Thur, Sat, and Sun)  . Wheat Dextrin (BENEFIBER ON THE GO) POWD Take 1 Dose by mouth daily as needed (constipation).   Marland Kitchen HYDROcodone-homatropine (HYDROMET) 5-1.5 MG/5ML syrup Take 5 mLs by mouth every 6 (six) hours as needed for cough.  . methylPREDNISolone (MEDROL) 8 MG tablet Take 1 tablet (8 mg total) by mouth daily.  . [DISCONTINUED] umeclidinium-vilanterol (ANORO ELLIPTA) 62.5-25 MCG/INH AEPB Inhale 1 puff into the lungs daily.   Facility-Administered Encounter Medications as of 09/16/2017  Medication  . Mepolizumab SOLR 100 mg  . [COMPLETED] methylPREDNISolone acetate (DEPO-MEDROL) injection 80 mg     Review of Systems  Constitutional: +fatigue    No  weight loss, night sweats,  fevers, chills HEENT:   No headaches,  Difficulty swallowing,  Tooth/dental problems, or  Sore throat, No sneezing, itching, ear ache, nasal congestion, post nasal drip  CV: No chest pain,  orthopnea, PND, swelling in lower extremities, anasarca, dizziness, palpitations, syncope  GI: +nv diarrhea  No heartburn, indigestion, abdominal pain, nausea, vomiting, diarrhea, change in bowel habits, loss of  appetite, bloody stools Resp: +sob with rest and exertion, cough, wheezing,     No coughing up of blood.  No change in color of mucus.  No chest wall deformity Skin: no rash, lesions, no skin changes. GU: no dysuria, change in color of urine, no urgency or frequency.  No flank pain, no hematuria  MS:  No joint pain or swelling.  No decreased range of motion.  No back pain. Psych:  No change in mood or affect. No depression or anxiety.  No memory loss.   Physical Exam  BP 130/84 (BP Location: Left Arm, Cuff Size: Normal)   Pulse 62   Ht 5\' 3"  (1.6 m)   Wt 207 lb 6.4 oz (94.1 kg)   SpO2 98%   BMI 36.74 kg/m   Wt Readings from Last 3 Encounters:  09/16/17 207 lb 6.4 oz (94.1 kg)  09/01/17 210 lb (95.3  kg)  08/27/17 206 lb 14.4 oz (93.8 kg)    GEN: A/Ox3; pleasant , NAD, well nourished, on RA   HEENT:  [place]/AT,  EACs-clear, TMs-wnl, NOSE-clear, THROAT-clear, no lesions, no postnasal drip or exudate noted.   NECK:  Supple w/ fair ROM; no JVD; normal carotid impulses w/o bruits; no thyromegaly or nodules palpated; no lymphadenopathy.    RESP:  +inspiratory and expiratory wheezing, diminished air movement in the bases  no accessory muscle use, no dullness to percussion  CARD:  RRR, no m/r/g, no peripheral edema, pulses intact, no cyanosis or clubbing.  GI:   Soft & nt; nml bowel sounds; no organomegaly or masses detected.   Musco: Warm bil, no deformities or joint swelling noted.   Neuro: alert, no focal deficits noted.    Skin: Warm, no lesions or rashes   Breathing Treatment in office today  Respiratory: Persistent inspiratory and expiratory wheezing, slightly better air movement especially in the bases.    Lab Results:  CBC    Component Value Date/Time   WBC 7.9 08/24/2017 0812   RBC 4.44 08/24/2017 0812   HGB 12.2 08/24/2017 0812   HCT 38.5 08/24/2017 0812   PLT 222 08/24/2017 0812   MCV 86.7 08/24/2017 0812   MCH 27.5 08/24/2017 0812   MCHC 31.7 08/24/2017 0812    RDW 15.4 08/24/2017 0812   LYMPHSABS 1.9 10/16/2016 1248   MONOABS 1.1 (H) 10/16/2016 1248   EOSABS 0.1 10/16/2016 1248   BASOSABS 0.1 10/16/2016 1248    BMET    Component Value Date/Time   NA 143 09/01/2017 1134   K 4.1 09/01/2017 1134   CL 109 09/01/2017 1134   CO2 27 09/01/2017 1134   GLUCOSE 96 09/01/2017 1134   BUN 14 09/01/2017 1134   CREATININE 0.99 09/01/2017 1134   CREATININE 1.06 03/26/2014 1503   CALCIUM 9.1 09/01/2017 1134   GFRNONAA 54 (L) 09/01/2017 1134   GFRAA >60 09/01/2017 1134    BNP    Component Value Date/Time   BNP 79.0 06/04/2016 1553   BNP 141.5 (H) 03/26/2014 1503    ProBNP No results found for: PROBNP  Imaging: No results found.   Assessment & Plan:   Pleasant 77 year old seen in office today.  Will do Depo-Medrol injection in office.  Patient will start Medrol taper tomorrow.  Patient knows to stop taking prednisone.  Patient to follow back up with our office in 2 weeks. Patient to call [facility] clinic and is notify them of the medication switching to Medrol.  Continue CPAP use.  Continue trelegy.  Continue Nucala.  Acute asthma exacerbation Medrol 8mg  tablet >>> start 09/17/2017 >>>6 tabs for 2 days, 5 tabs for 2 days, 4 tabs for 2 days, 3 tabs for 2 days, and 2 tabs for 2 days, and 1 tab for 2 days, then stop   >>>take with food  >>> This replaces your prednisone taper that we had previously prescribed  Depo-Medrol injection today in office  Notify [facility] clinic that he received this injection today as well as the Medrol prescription.  Follow-up with our office if you are not improving  Follow-up in our office in 2 weeks to check your status and how you are doing  Continue with CPAP use  Continue to avoid triggers, wear mask when doing any testing or cleaning in the house Duo nebs every 6 hours as needed for shortness of breath /wheezing Continue trelegy, do not take your Anoro with your trelegy >>> Trelegy  replaces  Anoro    Obstructive sleep apnea Continue CPAP use Continue to work towards healthy weight   Visit for monitoring Tikosyn therapy Discuss medication changes with [facility] clinic.     Lauraine Rinne, NP 09/16/2017

## 2017-09-16 NOTE — Assessment & Plan Note (Addendum)
Discuss medication changes with [facility] clinic.

## 2017-09-16 NOTE — Patient Instructions (Addendum)
Medrol 8mg  tablet >>> start 09/17/2017 >>>6 tabs for 2 days, 5 tabs for 2 days, 4 tabs for 2 days, 3 tabs for 2 days, and 2 tabs for 2 days, and 1 tab for 2 days, then stop   >>>take with food  >>> This replaces your prednisone taper that we had previously prescribed  Depo-Medrol injection today in office  Notify [facility] clinic that he received this injection today as well as the Medrol prescription.  Follow-up with our office if you are not improving  Follow-up in our office in 2 weeks to check your status and how you are doing  Continue with CPAP use  Continue to avoid triggers, wear mask when doing any testing or cleaning in the house Duo nebs every 6 hours as needed for shortness of breath /wheezing Continue trelegy, do not take your Anoro with your trelegy >>> Trelegy replaces Anoro     Please contact the office if your symptoms worsen or you have concerns that you are not improving.   Thank you for choosing [facility] for your healthcare, and for allowing [place] to partner with you on your healthcare journey. I am thankful to be able to provide care to you today.   Wyn Quaker FNP-C

## 2017-09-16 NOTE — Assessment & Plan Note (Signed)
Continue CPAP use Continue to work towards healthy weight

## 2017-09-16 NOTE — Addendum Note (Signed)
Addended by: Vivia Ewing on: 09/16/2017 03:33 PM   Modules accepted: Orders

## 2017-09-16 NOTE — Assessment & Plan Note (Signed)
Medrol 8mg  tablet >>> start 09/17/2017 >>>6 tabs for 2 days, 5 tabs for 2 days, 4 tabs for 2 days, 3 tabs for 2 days, and 2 tabs for 2 days, and 1 tab for 2 days, then stop   >>>take with food  >>> This replaces your prednisone taper that we had previously prescribed  Depo-Medrol injection today in office  Notify [facility] clinic that he received this injection today as well as the Medrol prescription.  Follow-up with our office if you are not improving  Follow-up in our office in 2 weeks to check your status and how you are doing  Continue with CPAP use  Continue to avoid triggers, wear mask when doing any testing or cleaning in the house Duo nebs every 6 hours as needed for shortness of breath /wheezing Continue trelegy, do not take your Anoro with your trelegy >>> Trelegy replaces Anoro

## 2017-09-17 ENCOUNTER — Telehealth: Payer: Self-pay | Admitting: Internal Medicine

## 2017-09-17 ENCOUNTER — Telehealth: Payer: Self-pay | Admitting: Pulmonary Disease

## 2017-09-17 NOTE — Telephone Encounter (Signed)
Called and spoke with pt, who is requesting clarification on medrol instructions, as bottle states one table tablet.  I have relayed below instructions to pt per AVS. Pt is aware and voiced her understanding. Nothing further is needed.   Medrol 8mg  tablet >>> start 09/17/2017 >>>6 tabs for 2 days, 5 tabs for 2 days, 4 tabs for 2 days, 3 tabs for 2 days, and 2 tabs for 2 days, and 1 tab for 2 days, then stop

## 2017-09-17 NOTE — Telephone Encounter (Signed)
1 vial Arrival Date:09/17/17 Lot #: [id number] Exp Date:03/2021

## 2017-09-17 NOTE — Telephone Encounter (Signed)
1 Vial Order Date: 09/16/17 Shipping Date: 09/16/17 [name] [name] is aware.

## 2017-09-17 NOTE — Telephone Encounter (Signed)
Error

## 2017-09-20 ENCOUNTER — Encounter: Payer: Self-pay | Admitting: Pulmonary Disease

## 2017-09-20 ENCOUNTER — Ambulatory Visit: Payer: Medicare Other

## 2017-09-20 ENCOUNTER — Other Ambulatory Visit (INDEPENDENT_AMBULATORY_CARE_PROVIDER_SITE_OTHER): Payer: Medicare Other

## 2017-09-20 ENCOUNTER — Ambulatory Visit (INDEPENDENT_AMBULATORY_CARE_PROVIDER_SITE_OTHER)
Admission: RE | Admit: 2017-09-20 | Discharge: 2017-09-20 | Disposition: A | Discharge status: Home / Self Care | Payer: Medicare Other | Source: Ambulatory Visit | Attending: Pulmonary Disease | Admitting: Pulmonary Disease

## 2017-09-20 ENCOUNTER — Ambulatory Visit (INDEPENDENT_AMBULATORY_CARE_PROVIDER_SITE_OTHER): Payer: Medicare Other | Admitting: Pulmonary Disease

## 2017-09-20 VITALS — BP 152/78 | HR 72 | Ht 63.0 in | Wt 209.6 lb

## 2017-09-20 DIAGNOSIS — J4541 Moderate persistent asthma with (acute) exacerbation: Secondary | ICD-10-CM

## 2017-09-20 DIAGNOSIS — R609 Edema, unspecified: Secondary | ICD-10-CM | POA: Diagnosis not present

## 2017-09-20 DIAGNOSIS — K219 Gastro-esophageal reflux disease without esophagitis: Secondary | ICD-10-CM

## 2017-09-20 DIAGNOSIS — R0602 Shortness of breath: Secondary | ICD-10-CM | POA: Diagnosis not present

## 2017-09-20 LAB — COMPREHENSIVE METABOLIC PANEL
ALBUMIN: 3.9 g/dL (ref 3.5–5.2)
ALK PHOS: 61 U/L (ref 39–117)
ALT: 16 U/L (ref 0–35)
AST: 18 U/L (ref 0–37)
BUN: 19 mg/dL (ref 6–23)
CALCIUM: 9.3 mg/dL (ref 8.4–10.5)
CHLORIDE: 105 meq/L (ref 96–112)
CO2: 25 mEq/L (ref 19–32)
Creatinine, Ser: 0.91 mg/dL (ref 0.40–1.20)
GFR: 63.56 mL/min (ref >60.00)
Glucose, Bld: 98 mg/dL (ref 70–99)
Potassium: 3.6 mEq/L (ref 3.5–5.1)
SODIUM: 141 meq/L (ref 135–145)
Total Bilirubin: 0.3 mg/dL (ref 0.2–1.2)
Total Protein: 6.4 g/dL (ref 6.0–8.3)

## 2017-09-20 LAB — CBC WITH DIFFERENTIAL/PLATELET
BASOS PCT: 0.2 % (ref 0.0–3.0)
Basophils Absolute: 0 10*3/uL (ref 0.0–0.1)
EOS PCT: 0 % (ref 0.0–5.0)
Eosinophils Absolute: 0 10*3/uL (ref 0.0–0.7)
HEMATOCRIT: 33.9 % — AB (ref 36.0–46.0)
Hemoglobin: 11.2 g/dL — ABNORMAL LOW (ref 12.0–15.0)
Lymphocytes Relative: 6.9 % — ABNORMAL LOW (ref 12.0–46.0)
Lymphs Abs: 1 10*3/uL (ref 0.7–4.0)
MCHC: 33.1 g/dL (ref 30.0–36.0)
MCV: 84.4 fl (ref 78.0–100.0)
MONOS PCT: 12.8 % — AB (ref 3.0–12.0)
Monocytes Absolute: 1.9 10*3/uL — ABNORMAL HIGH (ref 0.1–1.0)
Neutro Abs: 12 10*3/uL — ABNORMAL HIGH (ref 1.4–7.7)
Neutrophils Relative %: 80.1 % — ABNORMAL HIGH (ref 43.0–77.0)
Platelets: 219 10*3/uL (ref 150.0–400.0)
RBC: 4.02 Mil/uL (ref 3.87–5.11)
RDW: 17 % — AB (ref 11.5–15.5)
WBC: 14.9 10*3/uL — ABNORMAL HIGH (ref 4.0–10.5)

## 2017-09-20 LAB — POCT EXHALED NITRIC OXIDE: FeNO level (ppb): 6

## 2017-09-20 LAB — BRAIN NATRIURETIC PEPTIDE: Pro B Natriuretic peptide (BNP): 220 pg/mL — ABNORMAL HIGH (ref 0.0–100.0)

## 2017-09-20 MED ORDER — FLUTICASONE-UMECLIDIN-VILANT 100-62.5-25 MCG/INH IN AEPB: 1.0000 | INHALATION_SPRAY | Freq: Every day | RESPIRATORY_TRACT | 0 refills | Status: DC

## 2017-09-20 MED ORDER — FUROSEMIDE 20 MG PO TABS: 20.0000 mg | ORAL_TABLET | Freq: Every day | ORAL | 0 refills | Status: DC

## 2017-09-20 MED ORDER — BENZONATATE 200 MG PO CAPS: 200.0000 mg | ORAL_CAPSULE | Freq: Three times a day (TID) | ORAL | 3 refills | Status: DC | PRN

## 2017-09-20 MED ORDER — FAMOTIDINE 20 MG PO TABS: 20.0000 mg | ORAL_TABLET | Freq: Every day | ORAL | 2 refills | Status: DC

## 2017-09-20 MED ORDER — IPRATROPIUM-ALBUTEROL 0.5-2.5 (3) MG/3ML IN SOLN: 3.0000 mL | RESPIRATORY_TRACT | Status: AC

## 2017-09-20 NOTE — Progress Notes (Signed)
Discussed results with patient in office.  Nothing further is needed at this time.  Brian Mack FNP  

## 2017-09-20 NOTE — Assessment & Plan Note (Signed)
FeNo today >>> 6 Chest x-ray today Lab work today >>>CBC with diff, CMET, BNP  Pepcid 20mg   >>> Take 1 tablet at night to help with GERD Tessalon 200mg   >>> Take 1 tablet every 8 hours as needed for cough Delsym  >>> Can use this as needed for cough during the day Continue to avoid triggers such as dust, kill solution, coronation at the pool

## 2017-09-20 NOTE — Progress Notes (Signed)
@Patient  ID: [name] [name], female    DOB: [birth year], 77 y.o.   MRN: [id number]  Chief Complaint  Patient presents with  . Acute Visit    increased wheezing    Referring provider: Monico Blitz, MD  HPI: 77 female never smoker followed for OSA, allergic rhinitis, asthma, complicated by A. Fib/Coumadin, HBP, GERD, obesity   Recent  Pulmonary Encounters:   07/20/2017-  77 year old female never smoker followed for OSA, allergic rhinitis, asthma/ Nucala, complicated by A. Fib/Coumadin, HBP, GERD, obesity CPAP 9/Advanced ----OSA and Asthma; Pt states she is having a hard time breathing- ? allergies or asthma. Increased cough, wheezing, and SOB-was given pred taper and pred shot as well.  Reports the third episode of wheezing dyspnea at this year.  Current episode began a week ago with nasal congestion, chest tightness, wheeze but no sore throat, purulent sputum or fever.  She had been riding around in a garden with open windows and blames pollen.  Has now been on Nucala for 5 months, well-tolerated, but not sure its effective. Dr. Brigitte Pulse had given prednisone taper, erythromycin a week ago, now finished.  Continues Anoro, Singulair and her nebulizer with DuoNeb/ Pulmicort.  08/20/2017- 77 year old female never smoker followed for OSA, allergic rhinitis, asthma/ Nucala, complicated by A. Fib/Coumadin, HBP, GERD, obesity CPAP 9/Advanced Nucala  Anoro, Singulair, Neb Duoneb/ Pulmicort  ----Cpap is going well no problems at this time. Coughing is bad right now  somtimes dry and at other times productive.  Coughing had been very much better for some months but recently worse again for unknown reasons.  She admits pollen might be a factor.  Does worse if lying back in recliner.  Occasional scant sputum is usually clear.  Often has no problems when lying down.  Cold air and cold drinks tend to trigger and she always feels the source in her upper trachea or throat area.  Using her nebulizer  DuoNeb about twice most days.  She does not recognize reflux or heartburn. Pending another cardioversion.  09/16/17 Acute   Pleasant 77 year old patient seen in office today unfortunately patient is having 4 days of shortness of breath and wheezing.  Patient reports that the symptoms started to occur after she was exposed to a significant amount of dust as well as kills solution at her house.  Patient also reporting that on Sunday prior to symptoms starting she ate a large [employer] meal which exacerbated her GERD as well as upset her stomach.  Patient reporting nausea no vomiting after that instance which has resolved as of 6/17.  Since then patient has had continued respiratory problems.  Patient called our office and was started on a prednisone taper.  Patient is on her second day of that taper today.  Patient is on Tikosyn which she called to notify the A. fib clinic that she was starting the prednisone taper. Plan: Continue Trelegy, Depo-Medrol injection office, Medrol taper, duo nebs every 6 hours, continue trilogy    Tests:   NPSG prior to EMR in 2012 Office Spirometry 09/02/16-WNL. FVC 2.06/76%, FEV1 1.57/77%, ratio 0.76, FEF 25-75% 1.26/80% Allergy labs 09/02/16- eosinophils 200, total IgE 4230 causing elevation of all specific allergen antibody levels including Aspergillus. Nucala  + 2018 PFT-10/13/16-minimal obstruction without response to dilator, minimal reduction of diffusion. FVC 2.02/77%, FEV1 1.62/83%, ratio 0.80, TLC 92%, DLCO 77% CT soft tissue neck 09/22/16-1. 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 Subsegmental atelectasis and nonspecific linear patchy densities  in the lungs as described. They have a benign appearance.  Small hyperdense masses in the mediastinum are stable compared with 2015 supporting benign etiology such as ectopic thyroid tissue. Three-vessel prominent coronary artery  calcification. Bibasilar bronchiolectasis. -------------------------------------------------------------------------------------------   Chart Review:    09/20/17 Acute 77 year old patient seen in office today for continued acute symptoms of wheezing, shortness of breath.  Patient presented today for her Nucala injection.  Was unable to get due to symptoms.  Patient worked in the schedule for an acute visit.  Patient reports that symptoms have not changed since 09/16/2017 appointment.  Patient reports adherence to trilogy, duo nebs, Medrol taper.  Patient says she is doing her best to avoid her triggers of dust, killz solution.  Patient also reporting the symptoms worsen with her when her cough occurs.  Patient reporting that she is been using menthol cough drops a lot but now thinks that this is been actually making her cough worse.  Patient is only been using her Hycodan syrup to help manage her coughing symptoms.  Has not used anything over-the-counter.  Allergies  Allergen Reactions  . Bupropion Hcl Other (See Comments)    Suicidal Thoughts.   . Escitalopram Oxalate Other (See Comments)    Suicidal Thoughts.   . Fluticasone-Salmeterol Other (See Comments)    Advair - Caused patient to go into Afib.   Marland Kitchen Lisinopril Cough  . Serevent Other (See Comments)    Caused patient to go into Afib  . Macrodantin [Nitrofurantoin] Rash  . Penicillins Rash and Other (See Comments)    Childhood allergy  Has patient had a PCN reaction causing immediate rash, facial/tongue/throat swelling, SOB or lightheadedness with hypotension: Unknown Has patient had a PCN reaction causing severe rash involving mucus membranes or skin necrosis: Unknown Has patient had a PCN reaction that required hospitalization No Has patient had a PCN reaction occurring within the last 10 years: No If all of the above answers are "NO", then may proceed with Cephalosporin use.     Immunization History  Administered  Date(s) Administered  . Influenza Split 12/29/2010, 12/28/2012, 12/19/2013, 12/01/2016  . Influenza Whole 12/12/2011  . Influenza,inj,Quad PF,6+ Mos 12/31/2015  . Influenza-Unspecified 01/04/2015  . Pneumococcal Conjugate-13 12/19/2013  . Pneumococcal Polysaccharide-23 05/07/2006  . Td 05/13/2009  . Zoster 12/15/2010    Past Medical History:  Diagnosis Date  . Allergic rhinitis   . Anxiety   . Atrial flutter ([place])   . COPD (chronic obstructive pulmonary disease) ([place])   . Depression   . Essential hypertension   . Hyperlipidemia   . Lumbar disc disease   . Obesity   . OSA (obstructive sleep apnea)    CPAP  . Paroxysmal atrial fibrillation ([facility])    Element of tachycardia bradycardia syndrome  . Respiratory failure ([facility])     Tobacco History: Social History   Tobacco Use  Smoking Status Never Smoker  Smokeless Tobacco Never Used   Counseling given: Not Answered Continue not smoking.  Outpatient Encounter Medications as of 09/20/2017  Medication Sig  . acetaminophen (TYLENOL) 500 MG tablet Take 500 mg by mouth daily as needed for headache.  Marland Kitchen antiseptic oral rinse (BIOTENE) LIQD 15 mLs by Mouth Rinse route as needed for dry mouth.  Marland Kitchen atorvastatin (LIPITOR) 10 MG tablet Take 10 mg by mouth every evening.   . baclofen (LIORESAL) 10 MG tablet Take 10 mg by mouth as needed for muscle spasms.  . Biotin 5000 MCG CAPS Take 5,000 mcg by mouth daily.  Marland Kitchen  budesonide (PULMICORT) 0.5 MG/2ML nebulizer solution Take 0.5 mg by nebulization 2 (two) times daily.   . cetirizine (ZYRTEC) 10 MG tablet Take 10 mg by mouth daily as needed for allergies.  . clindamycin (CLEOCIN) 300 MG capsule 2 capsules as needed. Take prior to dental procedures  . cycloSPORINE (RESTASIS) 0.05 % ophthalmic emulsion Place 1 drop 2 (two) times daily into both eyes.   Marland Kitchen diltiazem (CARTIA XT) 120 MG 24 hr capsule Take 120 mg by mouth at bedtime.   . dofetilide (TIKOSYN) 500 MCG capsule Take 1 capsule (500 mcg  total) by mouth 2 (two) times daily.  . fluticasone (FLONASE) 50 MCG/ACT nasal spray Place 1 spray into both nostrils 2 (two) times daily.  . Fluticasone-Umeclidin-Vilant (TRELEGY ELLIPTA) 100-62.5-25 MCG/INH AEPB Inhale 1 puff into the lungs daily.  Marland Kitchen HYDROcodone-homatropine (HYDROMET) 5-1.5 MG/5ML syrup Take 5 mLs by mouth every 6 (six) hours as needed for cough.  . Hypromellose (ARTIFICIAL TEARS OP) Apply 1 drop to eye every 4 (four) hours as needed (dry eyes).   Marland Kitchen ipratropium (ATROVENT) 0.06 % nasal spray Place 2 sprays into both nostrils 4 (four) times daily as needed for rhinitis.   Marland Kitchen ipratropium-albuterol (DUONEB) 0.5-2.5 (3) MG/3ML SOLN Take 3 mLs by nebulization 2 (two) times daily.   Marland Kitchen losartan (COZAAR) 100 MG tablet Take 1 tablet (100 mg total) by mouth daily.  . Mepolizumab (NUCALA [place]) Inject into the skin every 30 (thirty) days.  . methylPREDNISolone (MEDROL) 8 MG tablet Take 1 tablet (8 mg total) by mouth daily.  . montelukast (SINGULAIR) 10 MG tablet Take 10 mg by mouth at bedtime.  Marland Kitchen omeprazole (PRILOSEC) 20 MG capsule Take 20 mg by mouth daily.  . potassium chloride SA (K-DUR,KLOR-CON) 20 MEQ tablet Take 1 tablet (20 mEq total) daily by mouth.  . sodium chloride (OCEAN) 0.65 % SOLN nasal spray Place 1 spray into both nostrils 2 (two) times daily.   Marland Kitchen triamcinolone ointment (KENALOG) 0.1 % Apply 1 application topically daily as needed for rash.  . warfarin (COUMADIN) 5 MG tablet Take 1 tablet (5 mg total) by mouth daily. (Patient taking differently: Take 2.5-5 mg by mouth every evening. Take 2.5 mg daily on Mon and Fri, then take 5mg s daily on Tues, Wed, Thur, Sat, and Sun)  . Wheat Dextrin (BENEFIBER ON THE GO) POWD Take 1 Dose by mouth daily as needed (constipation).   . benzonatate (TESSALON) 200 MG capsule Take 1 capsule (200 mg total) by mouth 3 (three) times daily as needed for cough.  . famotidine (PEPCID) 20 MG tablet Take 1 tablet (20 mg total) by mouth at bedtime.  .  Fluticasone-Umeclidin-Vilant (TRELEGY ELLIPTA) 100-62.5-25 MCG/INH AEPB Inhale 1 puff into the lungs daily.  . predniSONE (DELTASONE) 10 MG tablet Take 40mg x3days,30mg x3days,20mg x3days,10mg x3days,then stop (Patient not taking: Reported on 09/20/2017)   Facility-Administered Encounter Medications as of 09/20/2017  Medication  . ipratropium-albuterol (DUONEB) 0.5-2.5 (3) MG/3ML nebulizer solution 3 mL  . Mepolizumab SOLR 100 mg     Review of Systems  Constitutional:  +fatigue  No  weight loss, night sweats,  fevers, chills HEENT:   No headaches,  Difficulty swallowing,  Tooth/dental problems, or  Sore throat, No sneezing, itching, ear ache, nasal congestion, post nasal drip  CV: No chest pain,  orthopnea, PND, swelling in lower extremities, anasarca, dizziness, palpitations, syncope  GI: No heartburn, indigestion, abdominal pain, nausea, vomiting, diarrhea, change in bowel habits, loss of appetite, bloody stools Resp:+sob with exertion, dry cough, wheeze  No excess mucus, no productive cough,  No coughing up of blood.  No change in color of mucus.No chest wall deformity Skin: no rash, lesions, no skin changes. GU: no dysuria, change in color of urine, no urgency or frequency.  No flank pain, no hematuria  MS:  No joint pain or swelling.  No decreased range of motion.  No back pain. Psych:  No change in mood or affect. No depression or anxiety.  No memory loss.   Physical Exam  BP (!) 152/78   Pulse 72   Ht 5\' 3"  (1.6 m)   Wt 209 lb 9.6 oz (95.1 kg)   SpO2 97%   BMI 37.13 kg/m    Wt Readings from Last 3 Encounters:  09/20/17 209 lb 9.6 oz (95.1 kg)  09/16/17 207 lb 6.4 oz (94.1 kg)  09/01/17 210 lb (95.3 kg)     GEN: A/Ox3; pleasant , NAD, well nourished    HEENT:  [place]/AT,  EACs-clear, TMs-wnl, NOSE-clear, THROAT-clear, no lesions, no postnasal drip or exudate noted.  Sinus - non tender to palpation   NECK:  Supple w/ fair ROM; no JVD;  no lymphadenopathy.    RESP:   +inspiratory and expiratory wheezes, air movement though out, upper airway wheeze  no accessory muscle use, no dullness to percussion  CARD:  +1+ bilateral lower extremity edema RRR, no m/r/g, no peripheral edema, pulses intact, no cyanosis or clubbing.  GI:   Soft & nt; nml bowel sounds; no organomegaly or masses detected.   Musco: Warm bil, no deformities or joint swelling noted.   Neuro: alert, no focal deficits noted.    Skin: Warm, no lesions or rashes  FeNo - 6  Duo Neb in office  Resp exam after: improved air movement, persistent inspiratory and exp wheeze   Lab Results:  CBC    Component Value Date/Time   WBC 14.9 (H) 09/20/2017 1233   RBC 4.02 09/20/2017 1233   HGB 11.2 (L) 09/20/2017 1233   HCT 33.9 (L) 09/20/2017 1233   PLT 219.0 09/20/2017 1233   MCV 84.4 09/20/2017 1233   MCH 27.5 08/24/2017 0812   MCHC 33.1 09/20/2017 1233   RDW 17.0 (H) 09/20/2017 1233   LYMPHSABS 1.0 09/20/2017 1233   MONOABS 1.9 (H) 09/20/2017 1233   EOSABS 0.0 09/20/2017 1233   BASOSABS 0.0 09/20/2017 1233    BMET    Component Value Date/Time   NA 141 09/20/2017 1233   K 3.6 09/20/2017 1233   CL 105 09/20/2017 1233   CO2 25 09/20/2017 1233   GLUCOSE 98 09/20/2017 1233   BUN 19 09/20/2017 1233   CREATININE 0.91 09/20/2017 1233   CREATININE 1.06 03/26/2014 1503   CALCIUM 9.3 09/20/2017 1233   GFRNONAA 54 (L) 09/01/2017 1134   GFRAA >60 09/01/2017 1134    BNP    Component Value Date/Time   BNP 79.0 06/04/2016 1553   BNP 141.5 (H) 03/26/2014 1503    ProBNP    Component Value Date/Time   PROBNP 220.0 (H) 09/20/2017 1233    Imaging: Dg Chest 2 View  Result Date: 09/20/2017 CLINICAL DATA:  Moderate asthma with acute exacerbation.  GERD EXAM: CHEST - 2 VIEW COMPARISON:  04/02/2017 FINDINGS: COPD with pulmonary hyperinflation. Heart size mildly enlarged. Negative for heart failure or pneumonia. Lungs are clear. IMPRESSION: No active cardiopulmonary disease. Electronically  Signed   By: Franchot Gallo M.D.   On: 09/20/2017 12:35     Assessment & Plan:   Continued asthma  exacerbation.  Will do chest x-ray today, lab work, continue Medrol taper.  We will have patient follow-up in about 2 weeks.  We will also work to aggressively can control cough.  Tessalon prescribed today.  Patient to use Delsym over-the-counter.  Can continue Hycodan syrup at night.  We will also do lab work.  Fino today in office is 6.  Will aggressively work to control GERD.  Continue omeprazole in the morning.  Add Pepcid 20 mg at night.  Acute asthma exacerbation FeNo today >>> 6 Chest x-ray today Lab work today >>>CBC with diff, CMET, BNP  Pepcid 20mg   >>> Take 1 tablet at night to help with GERD Tessalon 200mg   >>> Take 1 tablet every 8 hours as needed for cough Delsym  >>> Can use this as needed for cough during the day Continue to avoid triggers such as dust, kill solution, coronation at the pool  Gastroesophageal reflux disease Continue omeprazole Pepcid 20mg   >>> Take 1 tablet at night to help with GERD Tessalon 200mg      Edema BNP today  Morbid obesity Continue to work towards healthy weight     Lauraine Rinne, NP 09/20/2017

## 2017-09-20 NOTE — Assessment & Plan Note (Signed)
Continue omeprazole Pepcid 20mg   >>> Take 1 tablet at night to help with GERD Tessalon 200mg 

## 2017-09-20 NOTE — Progress Notes (Signed)
Lab test results of come back.  Showing moderate fluid retention.  Kidney functioning and electrolytes look good.  Blood counts show some elevated results to simply indicating that where you are on steroids.  We will continue to monitor.  I will give you a short-term prescription of Lasix 20 mg daily for the next 3 days. Please call the AFIB clinic and notify them that we are wanting to start this medication for 3 days and to make sure they are monitoring your heart rhythm. Do not start medication till they tell you its okay.    This should help with fluid retention as well as watching your diet specifically avoiding anything that time in sodium such as pizza, [place], fast food, eating out at restaurants, and looking at labels when you are purchasing items in the grocery store or preparing meals.  Goal is to have 2 g or less of sodium a day.  Wyn Quaker FNP

## 2017-09-20 NOTE — Assessment & Plan Note (Signed)
Continue to work towards healthy weight 

## 2017-09-20 NOTE — Assessment & Plan Note (Addendum)
BNP today Avoid high sodium foods, goal of 2 g of sodium or less daily and diet

## 2017-09-20 NOTE — Patient Instructions (Addendum)
FeNo today >>> 6 Chest x-ray today Lab work today >>>CBC with diff, CMET, BNP  Pepcid 20mg   >>> Take 1 tablet at night to help with GERD Tessalon 200mg   >>> Take 1 tablet every 8 hours as needed for cough Delsym  >>> Can use this as needed for cough during the day Continue to avoid triggers such as dust, kill solution, coronation at the pool  . We believe you have a chronic/cyclical cough that is aggravated by reflux , coughing , and drainage.  . Goal is to not Cough or clear throat.  Marland Kitchen Avoid coughing or clearing throat by using:  o non-mint products/sugarless candy o Water o ice chips o Remember NO MINT PRODUCTS  . Medications to use:  o Mucinex DM 1-2 every 12 hrs or Delsym 2 tsp every 12 hrs f or cough o Tessalon Three times a day  As needed  Cough.  o Prilosec 20mg  30 min before breakfast, Pepcid 20 mg at night o Zyrtec 10mg  at bedtime o Chlor tabs 4mg  2 at bedtime  for nasal drip until cough is 100% cough free.      I would like to see you follow back up with our office within 2 weeks.  Sooner if symptoms are not improving.  Please contact the office if your symptoms worsen or you have concerns that you are not improving.   Thank you for choosing [facility] for your healthcare, and for allowing [place] to partner with you on your healthcare journey. I am thankful to be able to provide care to you today.   Wyn Quaker FNP-C

## 2017-09-21 ENCOUNTER — Other Ambulatory Visit: Payer: Self-pay | Admitting: Pulmonary Disease

## 2017-09-21 ENCOUNTER — Telehealth (HOSPITAL_COMMUNITY): Payer: Self-pay | Admitting: *Deleted

## 2017-09-21 ENCOUNTER — Telehealth: Payer: Self-pay | Admitting: Pulmonary Disease

## 2017-09-21 NOTE — Telephone Encounter (Signed)
Called pt and advised message from the provider. Pt understood and verbalized understanding. Nothing further is needed.    

## 2017-09-21 NOTE — Telephone Encounter (Signed)
Pt is calling back [phone] 

## 2017-09-21 NOTE — Telephone Encounter (Signed)
Called and spoke with patient regarding lab results. Patient is going to call her cardiologist and confirm that it is okay for her to start lasix 20mg  x 3 days. Awaiting call back.

## 2017-09-21 NOTE — Telephone Encounter (Signed)
Yes okay to eat raw vegetables and use Mrs. Dash.  Be aware that Mrs.Dash have potassium in it.  As we discussed in our office visit is worth considering seeing a nutritionist when things settle down to further discuss low-sodium diet.  Ideally avoid eating fast food and asked to see nutrition information at restaurants before ordering.  Wyn Quaker FNP

## 2017-09-21 NOTE — Telephone Encounter (Signed)
Patient called stating pulmonary drew a BNP/CMET yesterday - wanted results reviewed. K was 3.6 and BNP was slightly elevated (pt confirms swelling as well) so lasix 20mg  for 3 days was recommended. Instructed pt to increase potassium to 56meq BID until follow up next week with Dr. Rayann Heman and have BMEt rechecked at that time. Pt verbalized understanding. Importance of daily weight reviewed.

## 2017-09-21 NOTE — Telephone Encounter (Signed)
Spoke with pt, she wants to know if she can eat some raw vegetables and use Ms. Dash, since it doesn't have any sodium. 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 Any suggestions on what she can eat that is low in sodium would help. Aaron Edelman please advise.

## 2017-09-22 NOTE — Progress Notes (Signed)
Your chest x-ray results have come back showing no acute changes.  No other changes to your plan of care at this time.  [name] - can we also check to see how she is doing since we saw her in the office? And how she is doing with lasix?  Wyn Quaker, FNP

## 2017-09-23 NOTE — Progress Notes (Signed)
Noted. Thank you   Brian Mack FNP

## 2017-09-24 ENCOUNTER — Telehealth: Payer: Self-pay | Admitting: Internal Medicine

## 2017-09-24 NOTE — Telephone Encounter (Signed)
Patient returned call, CB is [phone].  States she is having phone issues.

## 2017-09-24 NOTE — Telephone Encounter (Signed)
Called and spoke with patient regarding her upcoming appt with B.Mack. Pt clarified that she has appt with cardiologist 09/27/2017. Pt verbalized understanding of her appt with B.Mack and had no questions. Nothing further needed at this time.

## 2017-09-24 NOTE — Telephone Encounter (Signed)
LMTCB

## 2017-09-27 ENCOUNTER — Encounter: Payer: Self-pay | Admitting: Internal Medicine

## 2017-09-27 ENCOUNTER — Ambulatory Visit (INDEPENDENT_AMBULATORY_CARE_PROVIDER_SITE_OTHER): Payer: Medicare Other | Admitting: Internal Medicine

## 2017-09-27 VITALS — BP 130/64 | HR 67 | Ht 63.0 in | Wt 202.0 lb

## 2017-09-27 DIAGNOSIS — I4819 Other persistent atrial fibrillation: Secondary | ICD-10-CM

## 2017-09-27 DIAGNOSIS — I10 Essential (primary) hypertension: Secondary | ICD-10-CM | POA: Diagnosis not present

## 2017-09-27 DIAGNOSIS — J449 Chronic obstructive pulmonary disease, unspecified: Secondary | ICD-10-CM

## 2017-09-27 DIAGNOSIS — I481 Persistent atrial fibrillation: Secondary | ICD-10-CM | POA: Diagnosis not present

## 2017-09-27 NOTE — Patient Instructions (Addendum)
Medication Instructions:  Your physician recommends that you continue on your current medications as directed. Please refer to the Current Medication list given to you today.  Labwork: None ordered     *We will only notify you of abnormal results, otherwise continue current treatment plan.  Testing/Procedures: None ordered  Follow-Up: Keep your currently scheduled appointments with Dr. Domenic Polite and Dr. Rayann Heman later this year.   * If you need a refill on your cardiac medications before your next appointment, please call your pharmacy.   *Please note that any paperwork needing to be filled out by the provider will need to be addressed at the front desk prior to seeing the provider. Please note that any FMLA, disability or other documents regarding health condition is subject to a $25.00 charge that must be received prior to completion of paperwork in the form of a money order or check.  Thank you for choosing [facility]!!

## 2017-09-27 NOTE — Progress Notes (Signed)
PCP: Monico Blitz, MD Primary Cardiologist: Dr Domenic Polite Primary EP: Dr Rayann Heman  [name] [name] is a 77 y.o. female who presents today for routine electrophysiology followup.  Since last being seen in our clinic, the patient reports doing very well.  + SOB.  Remains in sinus rhythm.  Today, she denies symptoms of palpitations, chest pain,   lower extremity edema, dizziness, presyncope, or syncope.  The patient is otherwise without complaint today.   Past Medical History:  Diagnosis Date  . Allergic rhinitis   . Anxiety   . Atrial flutter ([place])   . COPD (chronic obstructive pulmonary disease) ([place])   . Depression   . Essential hypertension   . Hyperlipidemia   . Lumbar disc disease   . Obesity   . OSA (obstructive sleep apnea)    CPAP  . Paroxysmal atrial fibrillation (H[facility])    Element of tachycardia bradycardia syndrome  . Respiratory failure [facility])    Past Surgical History:  Procedure Laterality Date  . ANTERIOR FUSION LUMBAR SPINE    . BIOPSY  08/27/2016   Procedure: BIOPSY;  Surgeon: Rogene Houston, MD;  Location: [facility] ENDO [facility];  Service: Endoscopy;;  FOUR GASTRIC POLYPS BIOPSIED  . CARDIOVERSION N/A 03/28/2014   Procedure: CARDIOVERSION;  Surgeon: Fay Records, MD;  Location: [facility] ORS;  Service: Cardiovascular;  Laterality: N/A;  . CARDIOVERSION N/A 10/22/2016   Procedure: CARDIOVERSION;  Surgeon: Sueanne Margarita, MD;  Location: [facility] ENDOSCOPY;  Service: Cardiovascular;  Laterality: N/A;  . CATARACT EXTRACTION    . COLONOSCOPY N/A 08/04/2012   Procedure: COLONOSCOPY;  Surgeon: Rogene Houston, MD;  Location: [facility];  Service: Endoscopy;  Laterality: N/A;  730-rescheduled to [place] notified pt  . ELECTROPHYSIOLOGIC STUDY N/A 09/18/2014   Procedure: Atrial Fibrillation Ablation;  Surgeon: Thompson Grayer, MD;  Location: [place] CV LAB;  Service: Cardiovascular;  Laterality: N/A;  . ESOPHAGOGASTRODUODENOSCOPY N/A 08/27/2016   Procedure: ESOPHAGOGASTRODUODENOSCOPY (EGD);   Surgeon: Rogene Houston, MD;  Location: [facility];  Service: Endoscopy;  Laterality: N/A;  1200  . LASIK     Both eyes  . TEE WITHOUT CARDIOVERSION N/A 09/18/2014   Procedure: TRANSESOPHAGEAL ECHOCARDIOGRAM (TEE);  Surgeon: Larey Dresser, MD;  Location: [place];  Service: Cardiovascular;  Laterality: N/A;  . TEE WITHOUT CARDIOVERSION N/A 10/22/2016   Procedure: TRANSESOPHAGEAL ECHOCARDIOGRAM (TEE);  Surgeon: Sueanne Margarita, MD;  Location: [facility] ENDOSCOPY;  Service: Cardiovascular;  Laterality: N/A;  . TEE WITHOUT CARDIOVERSION N/A 08/24/2017   Procedure: TRANSESOPHAGEAL ECHOCARDIOGRAM (TEE);  Surgeon: Josue Hector, MD;  Location: [facility] ENDOSCOPY;  Service: Cardiovascular;  Laterality: N/A;  . TOTAL HIP ARTHROPLASTY  2010   Right    ROS- all systems are reviewed and negatives except as per HPI above  Current Outpatient Medications  Medication Sig Dispense Refill  . acetaminophen (TYLENOL) 500 MG tablet Take 500 mg by mouth daily as needed for headache.    Marland Kitchen antiseptic oral rinse (BIOTENE) LIQD 15 mLs by Mouth Rinse route as needed for dry mouth.    Marland Kitchen atorvastatin (LIPITOR) 10 MG tablet Take 10 mg by mouth every evening.     . baclofen (LIORESAL) 10 MG tablet Take 10 mg by mouth as needed for muscle spasms.    . benzonatate (TESSALON) 200 MG capsule Take 1 capsule (200 mg total) by mouth 3 (three) times daily as needed for cough. 30 capsule 3  . Biotin 5000 MCG CAPS Take 5,000 mcg by mouth daily.    Marland Kitchen  budesonide (PULMICORT) 0.5 MG/2ML nebulizer solution Take 0.5 mg by nebulization 2 (two) times daily.     . cetirizine (ZYRTEC) 10 MG tablet Take 10 mg by mouth daily as needed for allergies.    . clindamycin (CLEOCIN) 300 MG capsule 2 capsules as needed. Take prior to dental procedures  0  . cycloSPORINE (RESTASIS) 0.05 % ophthalmic emulsion Place 1 drop 2 (two) times daily into both eyes.     Marland Kitchen diltiazem (CARTIA XT) 120 MG 24 hr capsule Take 120 mg by mouth at bedtime.     .  dofetilide (TIKOSYN) 500 MCG capsule Take 1 capsule (500 mcg total) by mouth 2 (two) times daily. 60 capsule 6  . famotidine (PEPCID) 20 MG tablet Take 1 tablet (20 mg total) by mouth at bedtime. 30 tablet 2  . fluticasone (FLONASE) 50 MCG/ACT nasal spray Place 1 spray into both nostrils 2 (two) times daily.    . Fluticasone-Umeclidin-Vilant (TRELEGY ELLIPTA) 100-62.5-25 MCG/INH AEPB Inhale 1 puff into the lungs daily. 1 each 0  . HYDROcodone-homatropine (HYDROMET) 5-1.5 MG/5ML syrup Take 5 mLs by mouth every 6 (six) hours as needed for cough. 120 mL 0  . Hypromellose (ARTIFICIAL TEARS OP) Apply 1 drop to eye every 4 (four) hours as needed (dry eyes).     Marland Kitchen ipratropium (ATROVENT) 0.06 % nasal spray Place 2 sprays into both nostrils 4 (four) times daily as needed for rhinitis.     Marland Kitchen ipratropium-albuterol (DUONEB) 0.5-2.5 (3) MG/3ML SOLN Take 3 mLs by nebulization 2 (two) times daily.     Marland Kitchen losartan (COZAAR) 100 MG tablet Take 1 tablet (100 mg total) by mouth daily. 90 tablet 2  . methylPREDNISolone (MEDROL) 8 MG tablet Take 1 tablet (8 mg total) by mouth daily. 44 tablet 0  . montelukast (SINGULAIR) 10 MG tablet Take 10 mg by mouth at bedtime.    Marland Kitchen omeprazole (PRILOSEC) 20 MG capsule Take 20 mg by mouth daily.    . potassium chloride SA (K-DUR,KLOR-CON) 20 MEQ tablet Take 1 tablet (20 mEq total) daily by mouth. 30 tablet 6  . predniSONE (STERAPRED UNI-PAK 21 TAB) 10 MG (21) TBPK tablet Take 10 mg by mouth daily. Possible 3 more days    . sodium chloride (OCEAN) 0.65 % SOLN nasal spray Place 1 spray into both nostrils 2 (two) times daily.     Marland Kitchen triamcinolone ointment (KENALOG) 0.1 % Apply 1 application topically daily as needed for rash.  2  . WARFARIN SODIUM PO Take by mouth as directed.    . Wheat Dextrin (BENEFIBER ON THE GO) POWD Take 1 Dose by mouth daily as needed (constipation).      Current Facility-Administered Medications  Medication Dose Route Frequency Provider Last Rate Last Dose  .  Mepolizumab SOLR 100 mg  100 mg Subcutaneous Q28 days Baird Lyons D, MD   100 mg at 08/20/17 0850    Physical Exam: Vitals:   09/27/17 1356  Weight: 202 lb (91.6 kg)  Height: 5\' 3"  (1.6 m)    GEN- The patient is well appearing, alert and oriented x 3 today.   Head- normocephalic, atraumatic Eyes-  Sclera clear, conjunctiva pink Ears- hearing intact Oropharynx- clear Lungs- diffuse expiratory wheezes with markedly prolonged expiratory phase,  Heart- Regular rate and rhythm, no murmurs, rubs or gallops, PMI not laterally displaced GI- soft, NT, ND, + BS Extremities- no clubbing, cyanosis, or edema  Wt Readings from Last 3 Encounters:  09/27/17 202 lb (91.6 kg)  09/20/17 209 lb  9.6 oz (95.1 kg)  09/16/17 207 lb 6.4 oz (94.1 kg)    EKG tracing ordered today is personally reviewed and shows sinus rhythm 70 bpm, PR 144 msec, QRS 124 msec, Qtc 421 msec, nonspecific St/T changes  Assessment and Plan:  1. Persistent afib/ atrial flutter Now improved with tikosyn We discussed lifestyle changes today  2. Obesity Body mass index is 35.78 kg/m. Wt Readings from Last 3 Encounters:  09/27/17 202 lb (91.6 kg)  09/20/17 209 lb 9.6 oz (95.1 kg)  09/16/17 207 lb 6.4 oz (94.1 kg)   3. HTN Stable No change required today  4. Chronic SOB/ severe COPD She has close follow-up with pulmonary No change required today  Follow-up with Dr Domenic Polite in [place] as scheduled in September Will need bmet, mg and ekg then.  I will see again in December unless problems arise in the interim.  Thompson Grayer MD, [facility] 09/27/2017 2:34 PM

## 2017-09-28 DIAGNOSIS — I10 Essential (primary) hypertension: Secondary | ICD-10-CM | POA: Diagnosis not present

## 2017-09-28 DIAGNOSIS — J449 Chronic obstructive pulmonary disease, unspecified: Secondary | ICD-10-CM | POA: Diagnosis not present

## 2017-09-28 DIAGNOSIS — R05 Cough: Secondary | ICD-10-CM | POA: Diagnosis not present

## 2017-09-28 DIAGNOSIS — Z6838 Body mass index (BMI) 38.0-38.9, adult: Secondary | ICD-10-CM | POA: Diagnosis not present

## 2017-09-28 DIAGNOSIS — Z6837 Body mass index (BMI) 37.0-37.9, adult: Secondary | ICD-10-CM | POA: Diagnosis not present

## 2017-09-28 DIAGNOSIS — Z299 Encounter for prophylactic measures, unspecified: Secondary | ICD-10-CM | POA: Diagnosis not present

## 2017-09-28 DIAGNOSIS — I4891 Unspecified atrial fibrillation: Secondary | ICD-10-CM | POA: Diagnosis not present

## 2017-10-01 ENCOUNTER — Encounter (HOSPITAL_COMMUNITY)
Admission: RE | Admit: 2017-10-01 | Discharge: 2017-10-01 | Disposition: A | Discharge status: Home / Self Care | Payer: Medicare Other | Source: Ambulatory Visit | Attending: Internal Medicine | Admitting: Internal Medicine

## 2017-10-01 ENCOUNTER — Ambulatory Visit: Payer: Medicare Other | Admitting: Pulmonary Disease

## 2017-10-01 ENCOUNTER — Encounter (HOSPITAL_COMMUNITY): Payer: Self-pay

## 2017-10-01 ENCOUNTER — Encounter

## 2017-10-01 VITALS — BP 124/52 | HR 62 | Ht 63.0 in | Wt 202.8 lb

## 2017-10-01 DIAGNOSIS — F419 Anxiety disorder, unspecified: Secondary | ICD-10-CM | POA: Diagnosis not present

## 2017-10-01 DIAGNOSIS — I4892 Unspecified atrial flutter: Secondary | ICD-10-CM | POA: Diagnosis not present

## 2017-10-01 DIAGNOSIS — E669 Obesity, unspecified: Secondary | ICD-10-CM | POA: Diagnosis not present

## 2017-10-01 DIAGNOSIS — J449 Chronic obstructive pulmonary disease, unspecified: Secondary | ICD-10-CM | POA: Insufficient documentation

## 2017-10-01 DIAGNOSIS — I480 Paroxysmal atrial fibrillation: Secondary | ICD-10-CM | POA: Diagnosis not present

## 2017-10-01 DIAGNOSIS — Z7901 Long term (current) use of anticoagulants: Secondary | ICD-10-CM | POA: Insufficient documentation

## 2017-10-01 DIAGNOSIS — Z79899 Other long term (current) drug therapy: Secondary | ICD-10-CM | POA: Diagnosis not present

## 2017-10-01 DIAGNOSIS — F329 Major depressive disorder, single episode, unspecified: Secondary | ICD-10-CM | POA: Insufficient documentation

## 2017-10-01 DIAGNOSIS — J4550 Severe persistent asthma, uncomplicated: Secondary | ICD-10-CM | POA: Insufficient documentation

## 2017-10-01 DIAGNOSIS — G4733 Obstructive sleep apnea (adult) (pediatric): Secondary | ICD-10-CM | POA: Diagnosis not present

## 2017-10-01 DIAGNOSIS — I10 Essential (primary) hypertension: Secondary | ICD-10-CM | POA: Insufficient documentation

## 2017-10-01 DIAGNOSIS — E785 Hyperlipidemia, unspecified: Secondary | ICD-10-CM | POA: Diagnosis not present

## 2017-10-01 HISTORY — DX: Unspecified asthma, uncomplicated: J45.909

## 2017-10-01 NOTE — Progress Notes (Signed)
Daily Session Note  Patient Details  Name: [name] [name] MRN: [id number] Date of Birth: 1941-02-25 Referring Provider:     PULMONARY REHAB OTHER RESPIRATORY from 10/01/2017 in [place]  Referring Provider  Dr. Annamaria Boots      Encounter Date: 10/01/2017  Check In: Session Check In - 10/01/17 0800      Check-In   Location  [facility]    Staff Present  Kyrese Gartman Angelina Pih, MS, EP, [facility], Exercise Physiologist;Gregory Luther Parody, BS, EP, Exercise Physiologist;Debra Wynetta Emery, RN, BSN    Supervising physician immediately available to respond to emergencies  See telemetry face sheet for immediately available MD    Medication changes reported      No    Fall or balance concerns reported     No    Tobacco Cessation  -- Never Smoked    Warm-up and Cool-down  Performed as group-led instruction    Resistance Training Performed  Yes    VAD Patient?  No    PAD/SET Patient?  No      Pain Assessment   Currently in Pain?  No/denies    Pain Score  0-No pain    Multiple Pain Sites  No       Capillary Blood Glucose: No results found for this or any previous visit (from the past 24 hour(s)).    Social History   Tobacco Use  Smoking Status Never Smoker  Smokeless Tobacco Never Used    Goals Met:  Independence with exercise equipment Improved SOB with ADL's Exercise tolerated well Personal goals reviewed Queuing for purse lip breathing No report of cardiac concerns or symptoms Strength training completed today  Goals Unmet:  Not Applicable  Comments: Check out: 1030   Dr. Sinda Du is Medical Director for [facility] Pulmonary Rehab.

## 2017-10-01 NOTE — Progress Notes (Addendum)
Cardiac/Pulmonary Rehab Medication Review by a Pharmacist  Does the patient  feel that his/her medications are working for him/her?  yes  Has the patient been experiencing any side effects to the medications prescribed?  no  Does the patient measure his/her own blood pressure or blood glucose at home?  yes   Does the patient have any problems obtaining medications due to transportation or finances?   tikosyn and trelegy- getting some samples and MD is helping out.  Understanding of regimen: good Understanding of indications: excellent Potential of compliance: excellent  Questions asked to Determine Patient Understanding of Medication Regimen:  1. What is the name of the medication?  2. What is the medication used for?  3. When should it be taken?  4. How much should be taken?  5. How will you take it?  6. What side effects should you report?  Understanding Defined as: Excellent: All questions above are correct Good: Questions 1-4 are correct Fair: Questions 1-2 are correct  Poor: 1 or none of the above questions are correct   Pharmacist comments: Patient has a great understanding of regiment and high compliance. Patient did not have her med list with her but went over medications and seems like everything is up to date from previous encounters. Patient's MD is helping with tikosyn and Trelegy samples at times.  She finished her prednisone therapy yesterday 7/4. She has no further questions about her medications.    Ramond Craver 10/01/2017 8:38 AM

## 2017-10-01 NOTE — Progress Notes (Signed)
Pulmonary Individual Treatment Plan  Patient Details  Name: [name] [name] MRN: [id number] Date of Birth: 1940-12-27 Referring Provider:     PULMONARY REHAB OTHER RESPIRATORY from 10/01/2017 in [place]  Referring Provider  Dr. Annamaria Boots      Initial Encounter Date:    PULMONARY REHAB OTHER RESPIRATORY from 10/01/2017 in [place]  Date  10/01/17      Visit Diagnosis: Asthma in adult, severe persistent, uncomplicated  Patient's Home Medications on Admission:   Current Outpatient Medications:  .  losartan (COZAAR) 100 MG tablet, Take 1 tablet (100 mg total) by mouth daily., Disp: 90 tablet, Rfl: 2 .  acetaminophen (TYLENOL) 500 MG tablet, Take 500 mg by mouth daily as needed for headache., Disp: , Rfl:  .  antiseptic oral rinse (BIOTENE) LIQD, 15 mLs by Mouth Rinse route as needed for dry mouth., Disp: , Rfl:  .  atorvastatin (LIPITOR) 10 MG tablet, Take 10 mg by mouth every evening. , Disp: , Rfl:  .  baclofen (LIORESAL) 10 MG tablet, Take 10 mg by mouth as needed for muscle spasms., Disp: , Rfl:  .  benzonatate (TESSALON) 200 MG capsule, Take 1 capsule (200 mg total) by mouth 3 (three) times daily as needed for cough., Disp: 30 capsule, Rfl: 3 .  Biotin 5000 MCG CAPS, Take 5,000 mcg by mouth daily., Disp: , Rfl:  .  budesonide (PULMICORT) 0.5 MG/2ML nebulizer solution, Take 0.5 mg by nebulization 2 (two) times daily. , Disp: , Rfl:  .  cetirizine (ZYRTEC) 10 MG tablet, Take 10 mg by mouth daily as needed for allergies., Disp: , Rfl:  .  clindamycin (CLEOCIN) 300 MG capsule, 2 capsules as needed. Take prior to dental procedures, Disp: , Rfl: 0 .  cycloSPORINE (RESTASIS) 0.05 % ophthalmic emulsion, Place 1 drop 2 (two) times daily into both eyes. , Disp: , Rfl:  .  diltiazem (CARTIA XT) 120 MG 24 hr capsule, Take 120 mg by mouth at bedtime. , Disp: , Rfl:  .  dofetilide (TIKOSYN) 500 MCG capsule, Take 1 capsule (500 mcg total) by mouth 2  (two) times daily., Disp: 60 capsule, Rfl: 6 .  famotidine (PEPCID) 20 MG tablet, Take 1 tablet (20 mg total) by mouth at bedtime., Disp: 30 tablet, Rfl: 2 .  fluticasone (FLONASE) 50 MCG/ACT nasal spray, Place 1 spray into both nostrils 2 (two) times daily., Disp: , Rfl:  .  Fluticasone-Umeclidin-Vilant (TRELEGY ELLIPTA) 100-62.5-25 MCG/INH AEPB, Inhale 1 puff into the lungs daily., Disp: 1 each, Rfl: 0 .  HYDROcodone-homatropine (HYDROMET) 5-1.5 MG/5ML syrup, Take 5 mLs by mouth every 6 (six) hours as needed for cough., Disp: 120 mL, Rfl: 0 .  Hypromellose (ARTIFICIAL TEARS OP), Apply 1 drop to eye every 4 (four) hours as needed (dry eyes). , Disp: , Rfl:  .  ipratropium (ATROVENT) 0.06 % nasal spray, Place 2 sprays into both nostrils 4 (four) times daily as needed for rhinitis. , Disp: , Rfl:  .  ipratropium-albuterol (DUONEB) 0.5-2.5 (3) MG/3ML SOLN, Take 3 mLs by nebulization 2 (two) times daily. , Disp: , Rfl:  .  methylPREDNISolone (MEDROL) 8 MG tablet, Take 1 tablet (8 mg total) by mouth daily. (Patient not taking: Reported on 10/01/2017), Disp: 44 tablet, Rfl: 0 .  montelukast (SINGULAIR) 10 MG tablet, Take 10 mg by mouth at bedtime., Disp: , Rfl:  .  omeprazole (PRILOSEC) 20 MG capsule, Take 20 mg by mouth daily., Disp: , Rfl:  .  potassium  chloride SA (K-DUR,KLOR-CON) 20 MEQ tablet, Take 1 tablet (20 mEq total) daily by mouth., Disp: 30 tablet, Rfl: 6 .  predniSONE (STERAPRED UNI-PAK 21 TAB) 10 MG (21) TBPK tablet, Take 10 mg by mouth daily. Completed 7/4, Disp: , Rfl:  .  sodium chloride (OCEAN) 0.65 % SOLN nasal spray, Place 1 spray into both nostrils 2 (two) times daily. , Disp: , Rfl:  .  triamcinolone ointment (KENALOG) 0.1 %, Apply 1 application topically daily as needed for rash., Disp: , Rfl: 2 .  WARFARIN SODIUM PO, Take by mouth as directed., Disp: , Rfl:  .  Wheat Dextrin (BENEFIBER ON THE GO) POWD, Take 1 Dose by mouth daily as needed (constipation). , Disp: , Rfl:   Current  Facility-Administered Medications:  Marland Kitchen  Mepolizumab SOLR 100 mg, 100 mg, Subcutaneous, Q28 days, Young, Clinton D, MD, 100 mg at 08/20/17 2694  Past Medical History: Past Medical History:  Diagnosis Date  . Allergic rhinitis   . Anxiety   . Asthma   . Atrial flutter ([place])   . COPD (chronic obstructive pulmonary disease) ([place])   . Depression   . Essential hypertension   . Hyperlipidemia   . Lumbar disc disease   . Obesity   . OSA (obstructive sleep apnea)    CPAP  . Paroxysmal atrial fibrillation ([facility])    Element of tachycardia bradycardia syndrome  . Respiratory failure ([facility])     Tobacco Use: Social History   Tobacco Use  Smoking Status Never Smoker  Smokeless Tobacco Never Used    Labs: Recent Review Flowsheet Data    There is no flowsheet data to display.      Capillary Blood Glucose: No results found for: GLUCAP   Pulmonary Assessment Scores: Pulmonary Assessment Scores    Row Name 10/01/17 1124         ADL UCSD   ADL Phase  Entry     SOB Score total  46     Rest  0     Walk  9     Stairs  4     Bath  2     Dress  2     Shop  2       CAT Score   CAT Score  19       mMRC Score   mMRC Score  3        Pulmonary Function Assessment: Pulmonary Function Assessment - 10/01/17 1123      Pulmonary Function Tests   FVC%  79 %    FEV1%  78 %    FEV1/FVC Ratio  99    RV%  92 %    DLCO%  81 %      Initial Spirometry Results   FVC%  79 %    FEV1%  78 %    FEV1/FVC Ratio  99      Post Bronchodilator Spirometry Results   FVC%  77 %    FEV1%  83 %    FEV1/FVC Ratio  108      Breath   Bilateral Breath Sounds  Clear    Shortness of Breath  Yes       Exercise Target Goals: Date: 10/01/17  Exercise Program Goal: Individual exercise prescription set using results from initial 6 min walk test and THRR while considering  patient's activity barriers and safety.   Exercise Prescription Goal: Initial exercise prescription builds to 30-45  minutes a day of aerobic activity, 2-3 days per week.  Home exercise guidelines will be given to patient during program as part of exercise prescription that the participant will acknowledge.  Activity Barriers & Risk Stratification: Activity Barriers & Cardiac Risk Stratification - 10/01/17 1007      Activity Barriers & Cardiac Risk Stratification   Activity Barriers  Back Problems;Deconditioning;Shortness of Breath;Left Hip Replacement;Right Hip Replacement;Other (comment)    Comments  Patient great toe and 2nd toes are crossed. has been told by her doctor not to do activities that make her go up on her toes.     Cardiac Risk Stratification  Moderate       6 Minute Walk: 6 Minute Walk    Row Name 10/01/17 1006         6 Minute Walk   Phase  Initial     Distance  900 feet     Distance % Change  0 %     Distance Feet Change  0 ft     Walk Time  6 minutes     # of Rest Breaks  0     MPH  1.7     METS  2.3     RPE  13     Perceived Dyspnea   14     VO2 Peak  6.24     Symptoms  Yes (comment)     Comments  Patient had to push a wheelchair half way through walk test due to extreme shortness of breath     Resting HR  62 bpm     Resting BP  124/52     Resting Oxygen Saturation   97 %     Exercise Oxygen Saturation  during 6 min walk  98 %     Max Ex. HR  97 bpm     Max Ex. BP  200/62     2 Minute Post BP  154/60        Oxygen Initial Assessment: Oxygen Initial Assessment - 10/01/17 1127      Home Oxygen   Home Oxygen Device  None    Sleep Oxygen Prescription  None    Home Exercise Oxygen Prescription  None    Home at Rest Exercise Oxygen Prescription  None    Compliance with Home Oxygen Use  -- N/A      Initial 6 min Walk   Oxygen Used  None      Program Oxygen Prescription   Program Oxygen Prescription  None       Oxygen Re-Evaluation:   Oxygen Discharge (Final Oxygen Re-Evaluation):   Initial Exercise Prescription: Initial Exercise Prescription - 10/01/17  1000      Date of Initial Exercise RX and Referring Provider   Date  10/01/17    Referring Provider  Dr. Annamaria Boots      NuStep   Level  1    SPM  77    Minutes  15    METs  1.8      Arm Ergometer   Level  1.2    Watts  16    RPM  22    Minutes  20    METs  2.2      Prescription Details   Frequency (times per week)  2    Duration  Progress to 30 minutes of continuous aerobic without signs/symptoms of physical distress      Intensity   THRR 40-80% of Max Heartrate  [phone]    Ratings of Perceived Exertion  11-13    Perceived  Dyspnea  0-4      Progression   Progression  Continue progressive overload as per policy without signs/symptoms or physical distress.      Resistance Training   Training Prescription  Yes    Weight  1    Reps  10-15       Perform Capillary Blood Glucose checks as needed.  Exercise Prescription Changes:   Exercise Comments:   Exercise Goals and Review:  Exercise Goals    Row Name 10/01/17 1009             Exercise Goals   Increase Physical Activity  Yes       Intervention  Develop an individualized exercise prescription for aerobic and resistive training based on initial evaluation findings, risk stratification, comorbidities and participant's personal goals.;Provide advice, education, support and counseling about physical activity/exercise needs.       Expected Outcomes  Short Term: Attend rehab on a regular basis to increase amount of physical activity.       Increase Strength and Stamina  Yes       Intervention  Provide advice, education, support and counseling about physical activity/exercise needs.;Develop an individualized exercise prescription for aerobic and resistive training based on initial evaluation findings, risk stratification, comorbidities and participant's personal goals.       Expected Outcomes  Short Term: Increase workloads from initial exercise prescription for resistance, speed, and METs.       Able to understand  and use rate of perceived exertion (RPE) scale  Yes       Intervention  Provide education and explanation on how to use RPE scale       Expected Outcomes  Short Term: Able to use RPE daily in rehab to express subjective intensity level;Long Term:  Able to use RPE to guide intensity level when exercising independently       Able to understand and use Dyspnea scale  Yes       Intervention  Provide education and explanation on how to use Dyspnea scale       Expected Outcomes  Short Term: Able to use Dyspnea scale daily in rehab to express subjective sense of shortness of breath during exertion;Long Term: Able to use Dyspnea scale to guide intensity level when exercising independently       Knowledge and understanding of Target Heart Rate Range (THRR)  Yes       Intervention  Provide education and explanation of THRR including how the numbers were predicted and where they are located for reference       Expected Outcomes  Short Term: Able to state/look up THRR;Short Term: Able to use daily as guideline for intensity in rehab;Long Term: Able to use THRR to govern intensity when exercising independently       Understanding of Exercise Prescription  Yes       Intervention  Provide education, explanation, and written materials on patient's individual exercise prescription       Expected Outcomes  Short Term: Able to explain program exercise prescription;Long Term: Able to explain home exercise prescription to exercise independently          Exercise Goals Re-Evaluation :   Discharge Exercise Prescription (Final Exercise Prescription Changes):   Nutrition:  Target Goals: Understanding of nutrition guidelines, daily intake of sodium 1500mg , cholesterol 200mg , calories 30% from fat and 7% or less from saturated fats, daily to have 5 or more servings of fruits and vegetables.  Biometrics: Pre Biometrics - 10/01/17 1010  Pre Biometrics   Height  5\' 3"  (1.6 m)    Weight  202 lb 12.8 oz (92 kg)     Waist Circumference  48 inches    Hip Circumference  39.5 inches    Waist to Hip Ratio  1.22 %    BMI (Calculated)  35.93    Triceps Skinfold  5 mm    % Body Fat  40.2 %    Grip Strength  51.06 kg    Flexibility  0 in    Single Leg Stand  7 seconds        Nutrition Therapy Plan and Nutrition Goals: Nutrition Therapy & Goals - 10/01/17 1130      Personal Nutrition Goals   Personal Goal #2  Patient is working on eating heart healthy. Handout given for her to improve on her food choices.     Additional Goals?  No       Nutrition Assessments: Nutrition Assessments - 10/01/17 1132      MEDFICTS Scores   Pre Score  54       Nutrition Goals Re-Evaluation:   Nutrition Goals Discharge (Final Nutrition Goals Re-Evaluation):   Psychosocial: Target Goals: Acknowledge presence or absence of significant depression and/or stress, maximize coping skills, provide positive support system. Participant is able to verbalize types and ability to use techniques and skills needed for reducing stress and depression.  Initial Review & Psychosocial Screening: Initial Psych Review & Screening - 10/01/17 1134      Initial Review   Current issues with  None Identified      Family Dynamics   Good Support System?  Yes      Barriers   Psychosocial barriers to participate in program  There are no identifiable barriers or psychosocial needs.      Screening Interventions   Interventions  Encouraged to exercise    Expected Outcomes  Short Term goal: Identification and review with participant of any Quality of Life or Depression concerns found by scoring the questionnaire.;Long Term goal: The participant improves quality of Life and PHQ9 Scores as seen by post scores and/or verbalization of changes       Quality of Life Scores: Quality of Life - 10/01/17 1010      Quality of Life   Select  Quality of Life      Quality of Life Scores   Health/Function Pre  24.4 %    Socioeconomic Pre   26.75 %    Psych/Spiritual Pre  24.86 %    Family Pre  18 %    GLOBAL Pre  24.55 %      Scores of 19 and below usually indicate a poorer quality of life in these areas.  A difference of  2-3 points is a clinically meaningful difference.  A difference of 2-3 points in the total score of the Quality of Life Index has been associated with significant improvement in overall quality of life, self-image, physical symptoms, and general health in studies assessing change in quality of life.   PHQ-9: Recent Review Flowsheet Data    Depression screen [facility] 2/9 10/01/2017   Decreased Interest 0   Down, Depressed, Hopeless 0   PHQ - 2 Score 0   Altered sleeping 2   Tired, decreased energy 2   Change in appetite 2   Feeling bad or failure about yourself  0   Trouble concentrating 0   Moving slowly or fidgety/restless 0   Suicidal thoughts 0   PHQ-9 Score  6   Difficult doing work/chores Not difficult at all     Interpretation of Total Score  Total Score Depression Severity:  1-4 = Minimal depression, 5-9 = Mild depression, 10-14 = Moderate depression, 15-19 = Moderately severe depression, 20-27 = Severe depression   Psychosocial Evaluation and Intervention: Psychosocial Evaluation - 10/01/17 1135      Psychosocial Evaluation & Interventions   Interventions  Encouraged to exercise with the program and follow exercise prescription    Continue Psychosocial Services   No Follow up required       Psychosocial Re-Evaluation:   Psychosocial Discharge (Final Psychosocial Re-Evaluation):    Education: Education Goals: Education classes will be provided on a weekly basis, covering required topics. Participant will state understanding/return demonstration of topics presented.  Learning Barriers/Preferences: Learning Barriers/Preferences - 10/01/17 1020      Learning Barriers/Preferences   Learning Barriers  None    Learning Preferences  Skilled Demonstration;Individual Instruction;Group  Instruction       Education Topics: How Lungs Work and Diseases: - Discuss the anatomy of the lungs and diseases that can affect the lungs, such as COPD.   Exercise: -Discuss the importance of exercise, FITT principles of exercise, normal and abnormal responses to exercise, and how to exercise safely.   Environmental Irritants: -Discuss types of environmental irritants and how to limit exposure to environmental irritants.   Meds/Inhalers and oxygen: - Discuss respiratory medications, definition of an inhaler and oxygen, and the proper way to use an inhaler and oxygen.   Energy Saving Techniques: - Discuss methods to conserve energy and decrease shortness of breath when performing activities of daily living.    Bronchial Hygiene / Breathing Techniques: - Discuss breathing mechanics, pursed-lip breathing technique,  proper posture, effective ways to clear airways, and other functional breathing techniques   Cleaning Equipment: - Provides group verbal and written instruction about the health risks of elevated stress, cause of high stress, and healthy ways to reduce stress.   Nutrition I: Fats: - Discuss the types of cholesterol, what cholesterol does to the body, and how cholesterol levels can be controlled.   Nutrition II: Labels: -Discuss the different components of food labels and how to read food labels.   Respiratory Infections: - Discuss the signs and symptoms of respiratory infections, ways to prevent respiratory infections, and the importance of seeking medical treatment when having a respiratory infection.   Stress I: Signs and Symptoms: - Discuss the causes of stress, how stress may lead to anxiety and depression, and ways to limit stress.   Stress II: Relaxation: -Discuss relaxation techniques to limit stress.   Oxygen for Home/Travel: - Discuss how to prepare for travel when on oxygen and proper ways to transport and store oxygen to ensure  safety.   Knowledge Questionnaire Score: Knowledge Questionnaire Score - 10/01/17 1118      Knowledge Questionnaire Score   Pre Score  14/18       Core Components/Risk Factors/Patient Goals at Admission: Personal Goals and Risk Factors at Admission - 10/01/17 1132      Core Components/Risk Factors/Patient Goals on Admission    Weight Management  Obesity    Personal Goal Other  Yes    Personal Goal  Have more energy, breath better, get back to swimming, sing on the choir again.     Intervention  Attend CR 3 x week and supplement with at home exercise 2 x week.     Expected Outcomes  Achieve personal goals.  Core Components/Risk Factors/Patient Goals Review:    Core Components/Risk Factors/Patient Goals at Discharge (Final Review):    ITP Comments:   Comments: Patient arrived for 1st visit/orientation/education at 0800. Patient was referred to PR by Dr. 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 During orientation advised patient on arrival and appointment times what to wear, what to do before, during and after exercise. Reviewed attendance and class policy. Talked about inclement weather and class consultation policy. Pt is scheduled to return Pulmonary Rehab on 10/05/17 at 1330. Pt was advised to come to class 15 minutes before class starts. Patient was also given instructions on meeting with the dietician and attending the Family Structure classes. Discussed RPE/Dpysnea scales. Discussed initial THR and how to find their radial and/or carotid pulse. Discussed the initial exercise prescription and how this effects their progress. Pt is eager to get started. Patient participated in warm up stretches followed by light weights and resistance bands. Patient was able to complete 6 minute walk test but had to push the w/c at 5 minutes into the test due to serve SOB. Patient was measured for the equipment. Discussed equipment safety with patient. Took patient pre-anthropometric  measurements. Patient finished visit at 10:30.

## 2017-10-03 NOTE — Progress Notes (Signed)
@Patient  ID: [name] [name], female    DOB: 1941/02/13, 77 y.o.   MRN: [id number]  Chief Complaint  Patient presents with  . Follow-up    States she is much better. States she starts pulmonary rehab tomorrow.     Referring provider: Monico Blitz, MD  HPI: 77 female never smoker followed for OSA, allergic rhinitis, asthma, complicated by A. Fib/Coumadin, HBP, GERD, obesity   Recent [facility] Encounters:   07/20/2017-  77 year old female never smoker followed for OSA, allergic rhinitis, asthma/ Nucala, complicated by A. Fib/Coumadin, HBP, GERD, obesity CPAP 9/Advanced ----OSA and Asthma; Pt states she is having a hard time breathing- ? allergies or asthma. Increased cough, wheezing, and SOB-was given pred taper and pred shot as well.  Reports the third episode of wheezing dyspnea at this year.  Current episode began a week ago with nasal congestion, chest tightness, wheeze but no sore throat, purulent sputum or fever.  She had been riding around in a garden with open windows and blames pollen.  Has now been on Nucala for 5 months, well-tolerated, but not sure its effective. Dr. Brigitte Pulse had given prednisone taper, erythromycin a week ago, now finished.  Continues Anoro, Singulair and her nebulizer with DuoNeb/ Pulmicort.  08/20/2017- 77 year old female never smoker followed for OSA, allergic rhinitis, asthma/ Nucala, complicated by A. Fib/Coumadin, HBP, GERD, obesity CPAP 9/Advanced Nucala  Anoro, Singulair, Neb Duoneb/ Pulmicort  ----Cpap is going well no problems at this time. Coughing is bad right now  somtimes dry and at other times productive.  Coughing had been very much better for some months but recently worse again for unknown reasons.  She admits pollen might be a factor.  Does worse if lying back in recliner.  Occasional scant sputum is usually clear.  Often has no problems when lying down.  Cold air and cold drinks tend to trigger and she always feels the source in her  upper trachea or throat area.  Using her nebulizer DuoNeb about twice most days.  She does not recognize reflux or heartburn. Pending another cardioversion.  09/16/17 Acute   Pleasant 77 year old patient seen in office today unfortunately patient is having 4 days of shortness of breath and wheezing.  Patient reports that the symptoms started to occur after she was exposed to a significant amount of dust as well as kills solution at her house.  Patient also reporting that on Sunday prior to symptoms starting she ate a large [employer] meal which exacerbated her GERD as well as upset her stomach.  Patient reporting nausea no vomiting after that instance which has resolved as of 6/17. Since then patient has had continued respiratory problems.  Patient called our office and was started on a prednisone taper.  Patient is on her second day of that taper today.  Patient is on Tikosyn which she called to notify the A. fib clinic that she was starting the prednisone taper. Plan: Continue Trelegy, Depo-Medrol injection office, Medrol taper, duo nebs every 6 hours, continue trilogy  09/20/17 Acute 77 year old patient seen in office today for continued acute symptoms of wheezing, shortness of breath.  Patient presented today for her Nucala injection.  Was unable to get due to symptoms.  Patient worked in the schedule for an acute visit. Patient reports that symptoms have not changed since 09/16/2017 appointment.  Patient reports adherence to trilogy, duo nebs, Medrol taper.  Patient says she is doing her best to avoid her triggers of dust, killz solution. Patient also reporting the  symptoms worsen with her when her cough occurs.  Patient reporting that she is been using menthol cough drops a lot but now thinks that this is been actually making her cough worse.  Patient is only been using her Hycodan syrup to help manage her coughing symptoms.  Has not used anything over-the-counter.   Tests:   NPSG prior to EMR  in 2012 Office Spirometry 09/02/16-WNL. FVC 2.06/76%, FEV1 1.57/77%, ratio 0.76, FEF 25-75% 1.26/80% Allergy labs 09/02/16- eosinophils 200, total IgE 4230 causing elevation of all specific allergen antibody levels including Aspergillus. Nucala  + 2018 PFT-10/13/16-minimal obstruction without response to dilator, minimal reduction of diffusion. FVC 2.02/77%, FEV1 1.62/83%, ratio 0.80, TLC 92%, DLCO 77% CT soft tissue neck 09/22/16-1. 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 Subsegmental atelectasis and nonspecific linear patchy densities in the lungs as described. They have a benign appearance.  Small hyperdense masses in the mediastinum are stable compared with 2015 supporting benign etiology such as ectopic thyroid tissue. Three-vessel prominent coronary artery calcification. Bibasilar bronchiolectasis. -------------------------------------------------------------------------------------------   Chart Review:          10/04/17 OV  Pleasant patient seen in office today.  Patient reports her symptoms have gotten much better since last being seen.  Patient is hoping to resume Nucala injections.  Patient reports that since being last seen she has gotten her house professionally cleaned, followed a much stricter diet for GERD management, been adherent to her medications.  And she feels that her respiratory status has significantly improved.   Allergies  Allergen Reactions  . Bupropion Hcl Other (See Comments)    Suicidal Thoughts.   . Escitalopram Oxalate Other (See Comments)    Suicidal Thoughts.   . Fluticasone-Salmeterol Other (See Comments)    Advair - Caused patient to go into Afib.   Marland Kitchen Lisinopril Cough  . Serevent Other (See Comments)    Caused patient to go into Afib  . Macrodantin [Nitrofurantoin] Rash  . Penicillins Rash and Other (See Comments)    Childhood allergy  Has patient had a PCN reaction causing  immediate rash, facial/tongue/throat swelling, SOB or lightheadedness with hypotension: Unknown Has patient had a PCN reaction causing severe rash involving mucus membranes or skin necrosis: Unknown Has patient had a PCN reaction that required hospitalization No Has patient had a PCN reaction occurring within the last 10 years: No If all of the above answers are "NO", then may proceed with Cephalosporin use.     Immunization History  Administered Date(s) Administered  . Influenza Split 12/29/2010, 12/28/2012, 12/19/2013, 12/01/2016  . Influenza Whole 12/12/2011  . Influenza,inj,Quad PF,6+ Mos 12/31/2015  . Influenza-Unspecified 01/04/2015  . Pneumococcal Conjugate-13 12/19/2013  . Pneumococcal Polysaccharide-23 05/07/2006  . Td 05/13/2009  . Zoster 12/15/2010    Past Medical History:  Diagnosis Date  . Allergic rhinitis   . Anxiety   . Asthma   . Atrial flutter ([place])   . COPD (chronic obstructive pulmonary disease) ([place])   . Depression   . Essential hypertension   . Hyperlipidemia   . Lumbar disc disease   . Obesity   . OSA (obstructive sleep apnea)    CPAP  . Paroxysmal atrial fibrillation ([facility])    Element of tachycardia bradycardia syndrome  . Respiratory failure ([facility])     Tobacco History: Social History   Tobacco Use  Smoking Status Never Smoker  Smokeless Tobacco Never Used   Counseling given: Yes Continue not smoking  Outpatient Encounter Medications as  of 10/04/2017  Medication Sig  . acetaminophen (TYLENOL) 500 MG tablet Take 500 mg by mouth daily as needed for headache.  Marland Kitchen antiseptic oral rinse (BIOTENE) LIQD 15 mLs by Mouth Rinse route as needed for dry mouth.  Marland Kitchen atorvastatin (LIPITOR) 10 MG tablet Take 10 mg by mouth every evening.   . baclofen (LIORESAL) 10 MG tablet Take 10 mg by mouth as needed for muscle spasms.  . benzonatate (TESSALON) 200 MG capsule Take 1 capsule (200 mg total) by mouth 3 (three) times daily as needed for cough.  . Biotin 5000  MCG CAPS Take 5,000 mcg by mouth daily.  . budesonide (PULMICORT) 0.5 MG/2ML nebulizer solution Take 0.5 mg by nebulization 2 (two) times daily.   . cetirizine (ZYRTEC) 10 MG tablet Take 10 mg by mouth daily as needed for allergies.  . clindamycin (CLEOCIN) 300 MG capsule 2 capsules as needed. Take prior to dental procedures  . cycloSPORINE (RESTASIS) 0.05 % ophthalmic emulsion Place 1 drop 2 (two) times daily into both eyes.   Marland Kitchen diltiazem (CARTIA XT) 120 MG 24 hr capsule Take 120 mg by mouth at bedtime.   . dofetilide (TIKOSYN) 500 MCG capsule Take 1 capsule (500 mcg total) by mouth 2 (two) times daily.  . famotidine (PEPCID) 20 MG tablet Take 1 tablet (20 mg total) by mouth at bedtime.  . fluticasone (FLONASE) 50 MCG/ACT nasal spray Place 1 spray into both nostrils 2 (two) times daily.  . Fluticasone-Umeclidin-Vilant (TRELEGY ELLIPTA) 100-62.5-25 MCG/INH AEPB Inhale 1 puff into the lungs daily.  Marland Kitchen HYDROcodone-homatropine (HYDROMET) 5-1.5 MG/5ML syrup Take 5 mLs by mouth every 6 (six) hours as needed for cough.  . Hypromellose (ARTIFICIAL TEARS OP) Apply 1 drop to eye every 4 (four) hours as needed (dry eyes).   Marland Kitchen ipratropium (ATROVENT) 0.06 % nasal spray Place 2 sprays into both nostrils 4 (four) times daily as needed for rhinitis.   Marland Kitchen ipratropium-albuterol (DUONEB) 0.5-2.5 (3) MG/3ML SOLN Take 3 mLs by nebulization 2 (two) times daily.   Marland Kitchen losartan (COZAAR) 100 MG tablet Take 1 tablet (100 mg total) by mouth daily.  . methylPREDNISolone (MEDROL) 8 MG tablet Take 1 tablet (8 mg total) by mouth daily.  . montelukast (SINGULAIR) 10 MG tablet Take 10 mg by mouth at bedtime.  Marland Kitchen omeprazole (PRILOSEC) 20 MG capsule Take 20 mg by mouth daily.  . potassium chloride SA (K-DUR,KLOR-CON) 20 MEQ tablet Take 1 tablet (20 mEq total) daily by mouth.  . sodium chloride (OCEAN) 0.65 % SOLN nasal spray Place 1 spray into both nostrils 2 (two) times daily.   Marland Kitchen triamcinolone ointment (KENALOG) 0.1 % Apply 1  application topically daily as needed for rash.  . WARFARIN SODIUM PO Take by mouth as directed.  . Wheat Dextrin (BENEFIBER ON THE GO) POWD Take 1 Dose by mouth daily as needed (constipation).   . Fluticasone-Umeclidin-Vilant (TRELEGY ELLIPTA) 100-62.5-25 MCG/INH AEPB Inhale 1 puff into the lungs daily.  . predniSONE (STERAPRED UNI-PAK 21 TAB) 10 MG (21) TBPK tablet Take 10 mg by mouth daily. Completed 7/4   Facility-Administered Encounter Medications as of 10/04/2017  Medication  . Mepolizumab SOLR 100 mg     Review of Systems  Review of Systems  Constitutional: Positive for fatigue (chronic - starts pulm rehab tomm). Negative for appetite change, chills and fever.  HENT: Negative for congestion, nosebleeds, postnasal drip, sinus pressure, sinus pain, sneezing and sore throat.   Respiratory: Positive for cough (dry cough). Negative for chest tightness, shortness of  breath and wheezing.   Cardiovascular: Negative for chest pain, palpitations and leg swelling.  Gastrointestinal: Negative for abdominal pain, blood in stool, constipation, diarrhea, nausea and vomiting.  Genitourinary: Positive for frequency. Negative for urgency.  Musculoskeletal: Negative for arthralgias.  Skin: Negative for rash.  Allergic/Immunologic: Positive for environmental allergies.  Neurological: Negative for dizziness, light-headedness and headaches.  Psychiatric/Behavioral: Negative for dysphoric mood and sleep disturbance. The patient is not nervous/anxious.       Physical Exam  BP 110/70   Pulse 75   Ht 5\' 3"  (1.6 m)   Wt 203 lb 9.6 oz (92.4 kg)   SpO2 93%   BMI 36.07 kg/m    Wt Readings from Last 5 Encounters:  10/04/17 203 lb 9.6 oz (92.4 kg)  10/01/17 202 lb 12.8 oz (92 kg)  09/27/17 202 lb (91.6 kg)  09/20/17 209 lb 9.6 oz (95.1 kg)  09/16/17 207 lb 6.4 oz (94.1 kg)     Physical Exam  Constitutional: She is oriented to person, place, and time and well-developed, well-nourished, and in no  distress. No distress.  HENT:  Head: Normocephalic.  Mouth/Throat: Oropharynx is clear and moist. No oropharyngeal exudate.  Eyes: Right eye exhibits no discharge. Left eye exhibits no discharge.  Neck: Normal range of motion. Neck supple.  Cardiovascular: Normal rate, regular rhythm and normal heart sounds.  Pulmonary/Chest: Effort normal and breath sounds normal. No accessory muscle usage. No respiratory distress. She has no decreased breath sounds. She has no wheezes.  Abdominal: Soft. Bowel sounds are normal. She exhibits no distension. There is no tenderness.  Musculoskeletal: Normal range of motion.  Lymphadenopathy:    She has no cervical adenopathy.  Neurological: She is alert and oriented to person, place, and time. GCS score is 15.  Skin: Skin is warm and dry. She is not diaphoretic.  Psychiatric: Mood, memory, affect and judgment normal.  Nursing note and vitals reviewed.     Lab Results:  CBC    Component Value Date/Time   WBC 14.9 (H) 09/20/2017 1233   RBC 4.02 09/20/2017 1233   HGB 11.2 (L) 09/20/2017 1233   HCT 33.9 (L) 09/20/2017 1233   PLT 219.0 09/20/2017 1233   MCV 84.4 09/20/2017 1233   MCH 27.5 08/24/2017 0812   MCHC 33.1 09/20/2017 1233   RDW 17.0 (H) 09/20/2017 1233   LYMPHSABS 1.0 09/20/2017 1233   MONOABS 1.9 (H) 09/20/2017 1233   EOSABS 0.0 09/20/2017 1233   BASOSABS 0.0 09/20/2017 1233    BMET    Component Value Date/Time   NA 141 09/20/2017 1233   K 3.6 09/20/2017 1233   CL 105 09/20/2017 1233   CO2 25 09/20/2017 1233   GLUCOSE 98 09/20/2017 1233   BUN 19 09/20/2017 1233   CREATININE 0.91 09/20/2017 1233   CREATININE 1.06 03/26/2014 1503   CALCIUM 9.3 09/20/2017 1233   GFRNONAA 54 (L) 09/01/2017 1134   GFRAA >60 09/01/2017 1134    BNP    Component Value Date/Time   BNP 79.0 06/04/2016 1553   BNP 141.5 (H) 03/26/2014 1503    ProBNP    Component Value Date/Time   PROBNP 220.0 (H) 09/20/2017 1233    Imaging: Dg Chest 2  View  Result Date: 09/20/2017 CLINICAL DATA:  Moderate asthma with acute exacerbation.  GERD EXAM: CHEST - 2 VIEW COMPARISON:  04/02/2017 FINDINGS: COPD with pulmonary hyperinflation. Heart size mildly enlarged. Negative for heart failure or pneumonia. Lungs are clear. IMPRESSION: No active cardiopulmonary disease. Electronically Signed  By: Franchot Gallo M.D.   On: 09/20/2017 12:35     Assessment & Plan:   Pleasant 77 year old patient seen office today.  Keep follow-up appointment with Dr. Annamaria Boots.  We will try to get patient scheduled for nucala injection today.  Patient and I discussed at length to avoid known triggers, and to follow-up with our office quickly if symptoms worsen.  Patient requesting trelegy sample today.  Will provide one.  Also discussed with patient [facility] for [facility] program.  Will provide paperwork for patient to complete.  Patient to start pulmonary rehab tomorrow.  A-fib Continue Tikosyn   Asthma, mild intermittent Continue trelegy Continue Nucala Trelegy sample provided today [employer] for me information provided to patient Patient to follow-up with pharmacy and get download of how much they have spent year to date on medications out-of-pocket Patient can hold Flonase for the next 5 to 7 days to see if this helps with nasal irritation Continue nasal saline rinses Continue to adhere to strict GERD diet Continue Nexium and Pepcid Resume Nucala injections Start pulmonary rehab Keep November 23, 2017 appointment with Dr. Annamaria Boots  Follow-up with our office if you have any concerns about your respiratory status, symptoms change, issues affording her medications.  Gastroesophageal reflux disease without esophagitis Continue Nexium and Pepcid Continue to follow strict GERD diet  Morbid obesity Continue forward with pulmonary rehab Continue to work towards healthy weight     Lauraine Rinne, NP 10/04/2017

## 2017-10-04 ENCOUNTER — Encounter: Payer: Self-pay | Admitting: Pulmonary Disease

## 2017-10-04 ENCOUNTER — Ambulatory Visit (INDEPENDENT_AMBULATORY_CARE_PROVIDER_SITE_OTHER): Payer: Medicare Other | Admitting: Pulmonary Disease

## 2017-10-04 ENCOUNTER — Ambulatory Visit: Payer: Medicare Other

## 2017-10-04 DIAGNOSIS — K219 Gastro-esophageal reflux disease without esophagitis: Secondary | ICD-10-CM | POA: Diagnosis not present

## 2017-10-04 DIAGNOSIS — I481 Persistent atrial fibrillation: Secondary | ICD-10-CM | POA: Diagnosis not present

## 2017-10-04 DIAGNOSIS — J4520 Mild intermittent asthma, uncomplicated: Secondary | ICD-10-CM | POA: Diagnosis not present

## 2017-10-04 DIAGNOSIS — I4819 Other persistent atrial fibrillation: Secondary | ICD-10-CM

## 2017-10-04 MED ORDER — FLUTICASONE-UMECLIDIN-VILANT 100-62.5-25 MCG/INH IN AEPB: 1.0000 | INHALATION_SPRAY | Freq: Every day | RESPIRATORY_TRACT | 0 refills | Status: DC

## 2017-10-04 NOTE — Assessment & Plan Note (Signed)
Continue Tikosyn

## 2017-10-04 NOTE — Patient Instructions (Addendum)
Trelegy sample provided today [place] for me information provided to patient Patient to follow-up with pharmacy and get download of how much they have spent year to date on medications out-of-pocket Patient can hold Flonase for the next 5 to 7 days to see if this helps with nasal irritation Continue nasal saline rinses Continue to adhere to strict GERD diet Continue Nexium and Pepcid Resume Nucala injections Start pulmonary rehab Keep November 23, 2017 appointment with Dr. Annamaria Boots  Follow-up with our office if you have any concerns about your respiratory status, symptoms change, issues affording her medications.    Please contact the office if your symptoms worsen or you have concerns that you are not improving.   Thank you for choosing [facility] for your healthcare, and for allowing [place] to partner with you on your healthcare journey. I am thankful to be able to provide care to you today.   Wyn Quaker FNP-C

## 2017-10-04 NOTE — Assessment & Plan Note (Addendum)
Continue trelegy Continue Nucala Trelegy sample provided today [facility] for me information provided to patient Patient to follow-up with pharmacy and get download of how much they have spent year to date on medications out-of-pocket Patient can hold Flonase for the next 5 to 7 days to see if this helps with nasal irritation Continue nasal saline rinses Continue to adhere to strict GERD diet Continue Nexium and Pepcid Resume Nucala injections Start pulmonary rehab Keep November 23, 2017 appointment with Dr. Annamaria Boots  Follow-up with our office if you have any concerns about your respiratory status, symptoms change, issues affording her medications.

## 2017-10-04 NOTE — Assessment & Plan Note (Signed)
Continue Nexium and Pepcid Continue to follow strict GERD diet

## 2017-10-04 NOTE — Assessment & Plan Note (Signed)
Continue forward with pulmonary rehab Continue to work towards healthy weight

## 2017-10-05 ENCOUNTER — Encounter (HOSPITAL_COMMUNITY)
Admission: RE | Admit: 2017-10-05 | Discharge: 2017-10-05 | Disposition: A | Discharge status: Home / Self Care | Payer: Medicare Other | Source: Ambulatory Visit | Attending: Internal Medicine | Admitting: Internal Medicine

## 2017-10-05 DIAGNOSIS — F329 Major depressive disorder, single episode, unspecified: Secondary | ICD-10-CM | POA: Diagnosis not present

## 2017-10-05 DIAGNOSIS — I4892 Unspecified atrial flutter: Secondary | ICD-10-CM | POA: Diagnosis not present

## 2017-10-05 DIAGNOSIS — J4550 Severe persistent asthma, uncomplicated: Secondary | ICD-10-CM

## 2017-10-05 DIAGNOSIS — G4733 Obstructive sleep apnea (adult) (pediatric): Secondary | ICD-10-CM | POA: Diagnosis not present

## 2017-10-05 DIAGNOSIS — I480 Paroxysmal atrial fibrillation: Secondary | ICD-10-CM | POA: Diagnosis not present

## 2017-10-05 DIAGNOSIS — F419 Anxiety disorder, unspecified: Secondary | ICD-10-CM | POA: Diagnosis not present

## 2017-10-05 NOTE — Progress Notes (Signed)
Daily Session Note  Patient Details  Name: [name] [name] MRN: [id number] Date of Birth: [birth year] Referring Provider:     PULMONARY REHAB OTHER RESPIRATORY from 10/01/2017 in [place]  Referring Provider  Dr. Annamaria Boots      Encounter Date: 10/05/2017  Check In: Session Check In - 10/05/17 1330      Check-In   Location  [facility]    Staff Present  Diane Angelina Pih, MS, EP, [facility], Exercise Physiologist;Rekha Hobbins Luther Parody, BS, EP, Exercise Physiologist    Supervising physician immediately available to respond to emergencies  See telemetry face sheet for immediately available MD    Medication changes reported      No    Fall or balance concerns reported     No    Warm-up and Cool-down  Performed as group-led instruction    Resistance Training Performed  Yes    VAD Patient?  No    PAD/SET Patient?  No      Pain Assessment   Currently in Pain?  No/denies    Pain Score  0-No pain    Multiple Pain Sites  No       Capillary Blood Glucose: No results found for this or any previous visit (from the past 24 hour(s)).    Social History   Tobacco Use  Smoking Status Never Smoker  Smokeless Tobacco Never Used    Goals Met:  Independence with exercise equipment Improved SOB with ADL's Using PLB without cueing & demonstrates good technique Exercise tolerated well No report of cardiac concerns or symptoms Strength training completed today  Goals Unmet:  Not Applicable  Comments: Check out 230   Dr. Sinda Du is Medical Director for [facility].

## 2017-10-06 NOTE — Progress Notes (Signed)
Pulmonary Individual Treatment Plan  Patient Details  Name: [name] [name] MRN: [id number] Date of Birth: Aug 19, 1940 Referring Provider:     PULMONARY REHAB OTHER RESPIRATORY from 10/01/2017 in [place]  Referring Provider  Dr. Annamaria Boots      Initial Encounter Date:    PULMONARY REHAB OTHER RESPIRATORY from 10/01/2017 in [place]  Date  10/01/17      Visit Diagnosis: Asthma in adult, severe persistent, uncomplicated  Patient's Home Medications on Admission:   Current Outpatient Medications:  .  acetaminophen (TYLENOL) 500 MG tablet, Take 500 mg by mouth daily as needed for headache., Disp: , Rfl:  .  antiseptic oral rinse (BIOTENE) LIQD, 15 mLs by Mouth Rinse route as needed for dry mouth., Disp: , Rfl:  .  atorvastatin (LIPITOR) 10 MG tablet, Take 10 mg by mouth every evening. , Disp: , Rfl:  .  baclofen (LIORESAL) 10 MG tablet, Take 10 mg by mouth as needed for muscle spasms., Disp: , Rfl:  .  benzonatate (TESSALON) 200 MG capsule, Take 1 capsule (200 mg total) by mouth 3 (three) times daily as needed for cough., Disp: 30 capsule, Rfl: 3 .  Biotin 5000 MCG CAPS, Take 5,000 mcg by mouth daily., Disp: , Rfl:  .  budesonide (PULMICORT) 0.5 MG/2ML nebulizer solution, Take 0.5 mg by nebulization 2 (two) times daily. , Disp: , Rfl:  .  cetirizine (ZYRTEC) 10 MG tablet, Take 10 mg by mouth daily as needed for allergies., Disp: , Rfl:  .  clindamycin (CLEOCIN) 300 MG capsule, 2 capsules as needed. Take prior to dental procedures, Disp: , Rfl: 0 .  cycloSPORINE (RESTASIS) 0.05 % ophthalmic emulsion, Place 1 drop 2 (two) times daily into both eyes. , Disp: , Rfl:  .  diltiazem (CARTIA XT) 120 MG 24 hr capsule, Take 120 mg by mouth at bedtime. , Disp: , Rfl:  .  dofetilide (TIKOSYN) 500 MCG capsule, Take 1 capsule (500 mcg total) by mouth 2 (two) times daily., Disp: 60 capsule, Rfl: 6 .  famotidine (PEPCID) 20 MG tablet, Take 1 tablet (20 mg  total) by mouth at bedtime., Disp: 30 tablet, Rfl: 2 .  fluticasone (FLONASE) 50 MCG/ACT nasal spray, Place 1 spray into both nostrils 2 (two) times daily., Disp: , Rfl:  .  Fluticasone-Umeclidin-Vilant (TRELEGY ELLIPTA) 100-62.5-25 MCG/INH AEPB, Inhale 1 puff into the lungs daily., Disp: 1 each, Rfl: 0 .  Fluticasone-Umeclidin-Vilant (TRELEGY ELLIPTA) 100-62.5-25 MCG/INH AEPB, Inhale 1 puff into the lungs daily., Disp: 1 each, Rfl: 0 .  HYDROcodone-homatropine (HYDROMET) 5-1.5 MG/5ML syrup, Take 5 mLs by mouth every 6 (six) hours as needed for cough., Disp: 120 mL, Rfl: 0 .  Hypromellose (ARTIFICIAL TEARS OP), Apply 1 drop to eye every 4 (four) hours as needed (dry eyes). , Disp: , Rfl:  .  ipratropium (ATROVENT) 0.06 % nasal spray, Place 2 sprays into both nostrils 4 (four) times daily as needed for rhinitis. , Disp: , Rfl:  .  ipratropium-albuterol (DUONEB) 0.5-2.5 (3) MG/3ML SOLN, Take 3 mLs by nebulization 2 (two) times daily. , Disp: , Rfl:  .  losartan (COZAAR) 100 MG tablet, Take 1 tablet (100 mg total) by mouth daily., Disp: 90 tablet, Rfl: 2 .  methylPREDNISolone (MEDROL) 8 MG tablet, Take 1 tablet (8 mg total) by mouth daily., Disp: 44 tablet, Rfl: 0 .  montelukast (SINGULAIR) 10 MG tablet, Take 10 mg by mouth at bedtime., Disp: , Rfl:  .  omeprazole (PRILOSEC) 20 MG  capsule, Take 20 mg by mouth daily., Disp: , Rfl:  .  potassium chloride SA (K-DUR,KLOR-CON) 20 MEQ tablet, Take 1 tablet (20 mEq total) daily by mouth., Disp: 30 tablet, Rfl: 6 .  predniSONE (STERAPRED UNI-PAK 21 TAB) 10 MG (21) TBPK tablet, Take 10 mg by mouth daily. Completed 7/4, Disp: , Rfl:  .  sodium chloride (OCEAN) 0.65 % SOLN nasal spray, Place 1 spray into both nostrils 2 (two) times daily. , Disp: , Rfl:  .  triamcinolone ointment (KENALOG) 0.1 %, Apply 1 application topically daily as needed for rash., Disp: , Rfl: 2 .  WARFARIN SODIUM PO, Take by mouth as directed., Disp: , Rfl:  .  Wheat Dextrin (BENEFIBER ON  THE GO) POWD, Take 1 Dose by mouth daily as needed (constipation). , Disp: , Rfl:   Current Facility-Administered Medications:  Marland Kitchen  Mepolizumab SOLR 100 mg, 100 mg, Subcutaneous, Q28 days, Young, Clinton D, MD, 100 mg at 08/20/17 6578  Past Medical History: Past Medical History:  Diagnosis Date  . Allergic rhinitis   . Anxiety   . Asthma   . Atrial flutter ([place])   . COPD (chronic obstructive pulmonary disease) ([place])   . Depression   . Essential hypertension   . Hyperlipidemia   . Lumbar disc disease   . Obesity   . OSA (obstructive sleep apnea)    CPAP  . Paroxysmal atrial fibrillation ([facility])    Element of tachycardia bradycardia syndrome  . Respiratory failure ([facility])     Tobacco Use: Social History   Tobacco Use  Smoking Status Never Smoker  Smokeless Tobacco Never Used    Labs: Recent Review Flowsheet Data    There is no flowsheet data to display.      Capillary Blood Glucose: No results found for: GLUCAP   Pulmonary Assessment Scores: Pulmonary Assessment Scores    Row Name 10/01/17 1124         ADL UCSD   ADL Phase  Entry     SOB Score total  46     Rest  0     Walk  9     Stairs  4     Bath  2     Dress  2     Shop  2       CAT Score   CAT Score  19       mMRC Score   mMRC Score  3        Pulmonary Function Assessment: Pulmonary Function Assessment - 10/01/17 1123      Pulmonary Function Tests   FVC%  79 %    FEV1%  78 %    FEV1/FVC Ratio  99    RV%  92 %    DLCO%  81 %      Initial Spirometry Results   FVC%  79 %    FEV1%  78 %    FEV1/FVC Ratio  99      Post Bronchodilator Spirometry Results   FVC%  77 %    FEV1%  83 %    FEV1/FVC Ratio  108      Breath   Bilateral Breath Sounds  Clear    Shortness of Breath  Yes       Exercise Target Goals:    Exercise Program Goal: Individual exercise prescription set using results from initial 6 min walk test and THRR while considering  patient's activity barriers and safety.    Exercise Prescription Goal: Initial exercise  prescription builds to 30-45 minutes a day of aerobic activity, 2-3 days per week.  Home exercise guidelines will be given to patient during program as part of exercise prescription that the participant will acknowledge.  Activity Barriers & Risk Stratification: Activity Barriers & Cardiac Risk Stratification - 10/01/17 1007      Activity Barriers & Cardiac Risk Stratification   Activity Barriers  Back Problems;Deconditioning;Shortness of Breath;Left Hip Replacement;Right Hip Replacement;Other (comment)    Comments  Patient great toe and 2nd toes are crossed. has been told by her doctor not to do activities that make her go up on her toes.     Cardiac Risk Stratification  Moderate       6 Minute Walk: 6 Minute Walk    Row Name 10/01/17 1006         6 Minute Walk   Phase  Initial     Distance  900 feet     Distance % Change  0 %     Distance Feet Change  0 ft     Walk Time  6 minutes     # of Rest Breaks  0     MPH  1.7     METS  2.3     RPE  13     Perceived Dyspnea   14     VO2 Peak  6.24     Symptoms  Yes (comment)     Comments  Patient had to push a wheelchair half way through walk test due to extreme shortness of breath     Resting HR  62 bpm     Resting BP  124/52     Resting Oxygen Saturation   97 %     Exercise Oxygen Saturation  during 6 min walk  98 %     Max Ex. HR  97 bpm     Max Ex. BP  200/62     2 Minute Post BP  154/60        Oxygen Initial Assessment: Oxygen Initial Assessment - 10/01/17 1127      Home Oxygen   Home Oxygen Device  None    Sleep Oxygen Prescription  None    Home Exercise Oxygen Prescription  None    Home at Rest Exercise Oxygen Prescription  None    Compliance with Home Oxygen Use  -- N/A      Initial 6 min Walk   Oxygen Used  None      Program Oxygen Prescription   Program Oxygen Prescription  None       Oxygen Re-Evaluation:   Oxygen Discharge (Final Oxygen  Re-Evaluation):   Initial Exercise Prescription: Initial Exercise Prescription - 10/01/17 1000      Date of Initial Exercise RX and Referring Provider   Date  10/01/17    Referring Provider  Dr. Annamaria Boots      NuStep   Level  1    SPM  77    Minutes  15    METs  1.8      Arm Ergometer   Level  1.2    Watts  16    RPM  22    Minutes  20    METs  2.2      Prescription Details   Frequency (times per week)  2    Duration  Progress to 30 minutes of continuous aerobic without signs/symptoms of physical distress      Intensity   THRR 40-80% of Max Heartrate  [phone]    Ratings of Perceived Exertion  11-13    Perceived Dyspnea  0-4      Progression   Progression  Continue progressive overload as per policy without signs/symptoms or physical distress.      Resistance Training   Training Prescription  Yes    Weight  1    Reps  10-15       Perform Capillary Blood Glucose checks as needed.  Exercise Prescription Changes:   Exercise Comments:   Exercise Goals and Review:  Exercise Goals    Row Name 10/01/17 1009             Exercise Goals   Increase Physical Activity  Yes       Intervention  Develop an individualized exercise prescription for aerobic and resistive training based on initial evaluation findings, risk stratification, comorbidities and participant's personal goals.;Provide advice, education, support and counseling about physical activity/exercise needs.       Expected Outcomes  Short Term: Attend rehab on a regular basis to increase amount of physical activity.       Increase Strength and Stamina  Yes       Intervention  Provide advice, education, support and counseling about physical activity/exercise needs.;Develop an individualized exercise prescription for aerobic and resistive training based on initial evaluation findings, risk stratification, comorbidities and participant's personal goals.       Expected Outcomes  Short Term: Increase workloads  from initial exercise prescription for resistance, speed, and METs.       Able to understand and use rate of perceived exertion (RPE) scale  Yes       Intervention  Provide education and explanation on how to use RPE scale       Expected Outcomes  Short Term: Able to use RPE daily in rehab to express subjective intensity level;Long Term:  Able to use RPE to guide intensity level when exercising independently       Able to understand and use Dyspnea scale  Yes       Intervention  Provide education and explanation on how to use Dyspnea scale       Expected Outcomes  Short Term: Able to use Dyspnea scale daily in rehab to express subjective sense of shortness of breath during exertion;Long Term: Able to use Dyspnea scale to guide intensity level when exercising independently       Knowledge and understanding of Target Heart Rate Range (THRR)  Yes       Intervention  Provide education and explanation of THRR including how the numbers were predicted and where they are located for reference       Expected Outcomes  Short Term: Able to state/look up THRR;Short Term: Able to use daily as guideline for intensity in rehab;Long Term: Able to use THRR to govern intensity when exercising independently       Understanding of Exercise Prescription  Yes       Intervention  Provide education, explanation, and written materials on patient's individual exercise prescription       Expected Outcomes  Short Term: Able to explain program exercise prescription;Long Term: Able to explain home exercise prescription to exercise independently          Exercise Goals Re-Evaluation : Exercise Goals Re-Evaluation    Row Name 10/04/17 1440             Exercise Goal Re-Evaluation   Exercise Goals Review  Increase Physical Activity;Increase Strength and Stamina;Able to understand and use rate  of perceived exertion (RPE) scale;Able to understand and use Dyspnea scale;Knowledge and understanding of Target Heart Rate Range  (THRR);Understanding of Exercise Prescription       Comments  Patient has not yet started PR. Patient will be progressed in time in accordance with her goals.        Expected Outcomes  Patient wishes to have more energy and to breathe better and to get back to singing and swimming again           Discharge Exercise Prescription (Final Exercise Prescription Changes):   Nutrition:  Target Goals: Understanding of nutrition guidelines, daily intake of sodium 1500mg , cholesterol 200mg , calories 30% from fat and 7% or less from saturated fats, daily to have 5 or more servings of fruits and vegetables.  Biometrics: Pre Biometrics - 10/01/17 1010      Pre Biometrics   Height  5\' 3"  (1.6 m)    Weight  202 lb 12.8 oz (92 kg)    Waist Circumference  48 inches    Hip Circumference  39.5 inches    Waist to Hip Ratio  1.22 %    BMI (Calculated)  35.93    Triceps Skinfold  5 mm    % Body Fat  40.2 %    Grip Strength  51.06 kg    Flexibility  0 in    Single Leg Stand  7 seconds        Nutrition Therapy Plan and Nutrition Goals: Nutrition Therapy & Goals - 10/01/17 1130      Personal Nutrition Goals   Personal Goal #2  Patient is working on eating heart healthy. Handout given for her to improve on her food choices.     Additional Goals?  No       Nutrition Assessments: Nutrition Assessments - 10/01/17 1132      MEDFICTS Scores   Pre Score  54       Nutrition Goals Re-Evaluation:   Nutrition Goals Discharge (Final Nutrition Goals Re-Evaluation):   Psychosocial: Target Goals: Acknowledge presence or absence of significant depression and/or stress, maximize coping skills, provide positive support system. Participant is able to verbalize types and ability to use techniques and skills needed for reducing stress and depression.  Initial Review & Psychosocial Screening: Initial Psych Review & Screening - 10/01/17 1134      Initial Review   Current issues with  None Identified       Family Dynamics   Good Support System?  Yes      Barriers   Psychosocial barriers to participate in program  There are no identifiable barriers or psychosocial needs.      Screening Interventions   Interventions  Encouraged to exercise    Expected Outcomes  Short Term goal: Identification and review with participant of any Quality of Life or Depression concerns found by scoring the questionnaire.;Long Term goal: The participant improves quality of Life and PHQ9 Scores as seen by post scores and/or verbalization of changes       Quality of Life Scores: Quality of Life - 10/01/17 1010      Quality of Life   Select  Quality of Life      Quality of Life Scores   Health/Function Pre  24.4 %    Socioeconomic Pre  26.75 %    Psych/Spiritual Pre  24.86 %    Family Pre  18 %    GLOBAL Pre  24.55 %      Scores of 19 and below  usually indicate a poorer quality of life in these areas.  A difference of  2-3 points is a clinically meaningful difference.  A difference of 2-3 points in the total score of the Quality of Life Index has been associated with significant improvement in overall quality of life, self-image, physical symptoms, and general health in studies assessing change in quality of life.   PHQ-9: Recent Review Flowsheet Data    Depression screen [facility] 2/9 10/01/2017   Decreased Interest 0   Down, Depressed, Hopeless 0   PHQ - 2 Score 0   Altered sleeping 2   Tired, decreased energy 2   Change in appetite 2   Feeling bad or failure about yourself  0   Trouble concentrating 0   Moving slowly or fidgety/restless 0   Suicidal thoughts 0   PHQ-9 Score 6   Difficult doing work/chores Not difficult at all     Interpretation of Total Score  Total Score Depression Severity:  1-4 = Minimal depression, 5-9 = Mild depression, 10-14 = Moderate depression, 15-19 = Moderately severe depression, 20-27 = Severe depression   Psychosocial Evaluation and Intervention: Psychosocial  Evaluation - 10/01/17 1135      Psychosocial Evaluation & Interventions   Interventions  Encouraged to exercise with the program and follow exercise prescription    Continue Psychosocial Services   No Follow up required       Psychosocial Re-Evaluation:   Psychosocial Discharge (Final Psychosocial Re-Evaluation):    Education: Education Goals: Education classes will be provided on a weekly basis, covering required topics. Participant will state understanding/return demonstration of topics presented.  Learning Barriers/Preferences: Learning Barriers/Preferences - 10/01/17 1020      Learning Barriers/Preferences   Learning Barriers  None    Learning Preferences  Skilled Demonstration;Individual Instruction;Group Instruction       Education Topics: How Lungs Work and Diseases: - Discuss the anatomy of the lungs and diseases that can affect the lungs, such as COPD.   Exercise: -Discuss the importance of exercise, FITT principles of exercise, normal and abnormal responses to exercise, and how to exercise safely.   Environmental Irritants: -Discuss types of environmental irritants and how to limit exposure to environmental irritants.   Meds/Inhalers and oxygen: - Discuss respiratory medications, definition of an inhaler and oxygen, and the proper way to use an inhaler and oxygen.   Energy Saving Techniques: - Discuss methods to conserve energy and decrease shortness of breath when performing activities of daily living.    Bronchial Hygiene / Breathing Techniques: - Discuss breathing mechanics, pursed-lip breathing technique,  proper posture, effective ways to clear airways, and other functional breathing techniques   Cleaning Equipment: - Provides group verbal and written instruction about the health risks of elevated stress, cause of high stress, and healthy ways to reduce stress.   Nutrition I: Fats: - Discuss the types of cholesterol, what cholesterol does to the  body, and how cholesterol levels can be controlled.   Nutrition II: Labels: -Discuss the different components of food labels and how to read food labels.   Respiratory Infections: - Discuss the signs and symptoms of respiratory infections, ways to prevent respiratory infections, and the importance of seeking medical treatment when having a respiratory infection.   Stress I: Signs and Symptoms: - Discuss the causes of stress, how stress may lead to anxiety and depression, and ways to limit stress.   Stress II: Relaxation: -Discuss relaxation techniques to limit stress.   Oxygen for Home/Travel: - Discuss how to  prepare for travel when on oxygen and proper ways to transport and store oxygen to ensure safety.   Knowledge Questionnaire Score: Knowledge Questionnaire Score - 10/01/17 1118      Knowledge Questionnaire Score   Pre Score  14/18       Core Components/Risk Factors/Patient Goals at Admission: Personal Goals and Risk Factors at Admission - 10/01/17 1132      Core Components/Risk Factors/Patient Goals on Admission    Weight Management  Obesity    Personal Goal Other  Yes    Personal Goal  Have more energy, breath better, get back to swimming, sing on the choir again.     Intervention  Attend CR 3 x week and supplement with at home exercise 2 x week.     Expected Outcomes  Achieve personal goals.        Core Components/Risk Factors/Patient Goals Review:    Core Components/Risk Factors/Patient Goals at Discharge (Final Review):    ITP Comments: ITP Comments    Row Name 10/06/17 1353           ITP Comments  Patient new to program. She has completed 2 sessions. Will continue to monitor for progress.           Comments: ITP 30 Day REVIEW Patient new to program. She has completed 2 sessions. Will continue to monitor for progress.

## 2017-10-07 ENCOUNTER — Other Ambulatory Visit (HOSPITAL_COMMUNITY): Payer: Self-pay | Admitting: *Deleted

## 2017-10-07 ENCOUNTER — Encounter (HOSPITAL_COMMUNITY)
Admission: RE | Admit: 2017-10-07 | Discharge: 2017-10-07 | Disposition: A | Discharge status: Home / Self Care | Payer: Medicare Other | Source: Ambulatory Visit | Attending: Internal Medicine | Admitting: Internal Medicine

## 2017-10-07 DIAGNOSIS — G4733 Obstructive sleep apnea (adult) (pediatric): Secondary | ICD-10-CM | POA: Diagnosis not present

## 2017-10-07 DIAGNOSIS — I4892 Unspecified atrial flutter: Secondary | ICD-10-CM | POA: Diagnosis not present

## 2017-10-07 DIAGNOSIS — F329 Major depressive disorder, single episode, unspecified: Secondary | ICD-10-CM | POA: Diagnosis not present

## 2017-10-07 DIAGNOSIS — J4550 Severe persistent asthma, uncomplicated: Secondary | ICD-10-CM | POA: Diagnosis not present

## 2017-10-07 DIAGNOSIS — F419 Anxiety disorder, unspecified: Secondary | ICD-10-CM | POA: Diagnosis not present

## 2017-10-07 DIAGNOSIS — I480 Paroxysmal atrial fibrillation: Secondary | ICD-10-CM | POA: Diagnosis not present

## 2017-10-07 MED ORDER — POTASSIUM CHLORIDE CRYS ER 20 MEQ PO TBCR: 20.0000 meq | EXTENDED_RELEASE_TABLET | Freq: Every day | ORAL | 6 refills | Status: DC

## 2017-10-07 NOTE — Progress Notes (Deleted)
For heart healthy choices add >50% of whole grains, make half their plate fruits and vegetables. Discuss the difference between starchy vegetables and leafy greens, and how leafy vegetables provide fiber, helps maintain healthy weight, helps control blood glucose, and lowers cholesterol.  Discuss purchasing fresh or frozen vegetable to reduce sodium and not to add grease, fat or sugar. Consume <18oz of red meat per week. Consume lean cuts of meats and very little of meats high in sodium and nitrates such as pork and lunch meats. Discussed portion control for all food groups.

## 2017-10-11 DIAGNOSIS — M546 Pain in thoracic spine: Secondary | ICD-10-CM | POA: Diagnosis not present

## 2017-10-11 DIAGNOSIS — M9903 Segmental and somatic dysfunction of lumbar region: Secondary | ICD-10-CM | POA: Diagnosis not present

## 2017-10-11 DIAGNOSIS — M9905 Segmental and somatic dysfunction of pelvic region: Secondary | ICD-10-CM | POA: Diagnosis not present

## 2017-10-11 DIAGNOSIS — M545 Low back pain: Secondary | ICD-10-CM | POA: Diagnosis not present

## 2017-10-11 DIAGNOSIS — M542 Cervicalgia: Secondary | ICD-10-CM | POA: Diagnosis not present

## 2017-10-11 DIAGNOSIS — M9902 Segmental and somatic dysfunction of thoracic region: Secondary | ICD-10-CM | POA: Diagnosis not present

## 2017-10-11 DIAGNOSIS — M9901 Segmental and somatic dysfunction of cervical region: Secondary | ICD-10-CM | POA: Diagnosis not present

## 2017-10-12 ENCOUNTER — Encounter (HOSPITAL_COMMUNITY)
Admission: RE | Admit: 2017-10-12 | Discharge: 2017-10-12 | Disposition: A | Discharge status: Home / Self Care | Payer: Medicare Other | Source: Ambulatory Visit | Attending: Internal Medicine | Admitting: Internal Medicine

## 2017-10-12 DIAGNOSIS — I4891 Unspecified atrial fibrillation: Secondary | ICD-10-CM | POA: Diagnosis not present

## 2017-10-12 DIAGNOSIS — F329 Major depressive disorder, single episode, unspecified: Secondary | ICD-10-CM | POA: Diagnosis not present

## 2017-10-12 DIAGNOSIS — J4550 Severe persistent asthma, uncomplicated: Secondary | ICD-10-CM | POA: Diagnosis not present

## 2017-10-12 DIAGNOSIS — G4733 Obstructive sleep apnea (adult) (pediatric): Secondary | ICD-10-CM | POA: Diagnosis not present

## 2017-10-12 DIAGNOSIS — I4892 Unspecified atrial flutter: Secondary | ICD-10-CM | POA: Diagnosis not present

## 2017-10-12 DIAGNOSIS — I10 Essential (primary) hypertension: Secondary | ICD-10-CM | POA: Diagnosis not present

## 2017-10-12 DIAGNOSIS — I480 Paroxysmal atrial fibrillation: Secondary | ICD-10-CM | POA: Diagnosis not present

## 2017-10-12 DIAGNOSIS — Z299 Encounter for prophylactic measures, unspecified: Secondary | ICD-10-CM | POA: Diagnosis not present

## 2017-10-12 DIAGNOSIS — Z6837 Body mass index (BMI) 37.0-37.9, adult: Secondary | ICD-10-CM | POA: Diagnosis not present

## 2017-10-12 DIAGNOSIS — F419 Anxiety disorder, unspecified: Secondary | ICD-10-CM | POA: Diagnosis not present

## 2017-10-12 NOTE — Progress Notes (Signed)
Daily Session Note  Patient Details  Name: [name] [name] MRN: [id number] Date of Birth: [birth year] Referring Provider:     PULMONARY REHAB OTHER RESPIRATORY from 10/01/2017 in [place]  Referring Provider  Dr. Annamaria Boots      Encounter Date: 10/12/2017  Check In: Session Check In - 10/12/17 1300      Check-In   Location  [facility]    Staff Present  Diane Angelina Pih, MS, EP, [facility], Exercise Physiologist;Marshon Bangs Wynetta Emery, RN, BSN    Supervising physician immediately available to respond to emergencies  See telemetry face sheet for immediately available MD    Medication changes reported      No    Fall or balance concerns reported     No    Warm-up and Cool-down  Performed as group-led instruction    Resistance Training Performed  Yes    VAD Patient?  No    PAD/SET Patient?  No      Pain Assessment   Currently in Pain?  No/denies    Pain Score  0-No pain    Multiple Pain Sites  No       Capillary Blood Glucose: No results found for this or any previous visit (from the past 24 hour(s)).    Social History   Tobacco Use  Smoking Status Never Smoker  Smokeless Tobacco Never Used    Goals Met:  Proper associated with RPD/PD & O2 Sat Independence with exercise equipment Improved SOB with ADL's Using PLB without cueing & demonstrates good technique Exercise tolerated well No report of cardiac concerns or symptoms Strength training completed today  Goals Unmet:  Not Applicable  Comments: Check out 1400.   Dr. Sinda Du is Medical Director for [facility].

## 2017-10-14 ENCOUNTER — Encounter (HOSPITAL_COMMUNITY)
Admission: RE | Admit: 2017-10-14 | Discharge: 2017-10-14 | Disposition: A | Discharge status: Home / Self Care | Payer: Medicare Other | Source: Ambulatory Visit | Attending: Internal Medicine | Admitting: Internal Medicine

## 2017-10-14 DIAGNOSIS — I4892 Unspecified atrial flutter: Secondary | ICD-10-CM | POA: Diagnosis not present

## 2017-10-14 DIAGNOSIS — I480 Paroxysmal atrial fibrillation: Secondary | ICD-10-CM | POA: Diagnosis not present

## 2017-10-14 DIAGNOSIS — J4550 Severe persistent asthma, uncomplicated: Secondary | ICD-10-CM

## 2017-10-14 DIAGNOSIS — G4733 Obstructive sleep apnea (adult) (pediatric): Secondary | ICD-10-CM | POA: Diagnosis not present

## 2017-10-14 DIAGNOSIS — F329 Major depressive disorder, single episode, unspecified: Secondary | ICD-10-CM | POA: Diagnosis not present

## 2017-10-14 DIAGNOSIS — F419 Anxiety disorder, unspecified: Secondary | ICD-10-CM | POA: Diagnosis not present

## 2017-10-14 NOTE — Progress Notes (Signed)
Daily Session Note  Patient Details  Name: [name] [name] MRN: [id number] Date of Birth: 03-Jan-1941 Referring Provider:     PULMONARY REHAB OTHER RESPIRATORY from 10/01/2017 in [place]  Referring Provider  Dr. Annamaria Boots      Encounter Date: 10/14/2017  Check In: Session Check In - 10/14/17 1300      Check-In   Location  [facility]    Staff Present  Diane Angelina Pih, MS, EP, [facility], Exercise Physiologist;Mansi Tokar Wynetta Emery, RN, BSN    Supervising physician immediately available to respond to emergencies  See telemetry face sheet for immediately available MD    Medication changes reported      No    Fall or balance concerns reported     No    Warm-up and Cool-down  Performed as group-led instruction    Resistance Training Performed  Yes    VAD Patient?  No    PAD/SET Patient?  No      Pain Assessment   Currently in Pain?  No/denies    Pain Score  0-No pain    Multiple Pain Sites  No       Capillary Blood Glucose: No results found for this or any previous visit (from the past 24 hour(s)).    Social History   Tobacco Use  Smoking Status Never Smoker  Smokeless Tobacco Never Used    Goals Met:  Proper associated with RPD/PD & O2 Sat Independence with exercise equipment Improved SOB with ADL's Using PLB without cueing & demonstrates good technique Exercise tolerated well No report of cardiac concerns or symptoms Strength training completed today  Goals Unmet:  Not Applicable  Comments: Check out 1400.   Dr. Sinda Du is Medical Director for [facility].

## 2017-10-19 ENCOUNTER — Encounter (HOSPITAL_COMMUNITY): Payer: Medicare Other

## 2017-10-20 DIAGNOSIS — Z299 Encounter for prophylactic measures, unspecified: Secondary | ICD-10-CM | POA: Diagnosis not present

## 2017-10-20 DIAGNOSIS — Z713 Dietary counseling and surveillance: Secondary | ICD-10-CM | POA: Diagnosis not present

## 2017-10-20 DIAGNOSIS — Z6836 Body mass index (BMI) 36.0-36.9, adult: Secondary | ICD-10-CM | POA: Diagnosis not present

## 2017-10-20 DIAGNOSIS — J029 Acute pharyngitis, unspecified: Secondary | ICD-10-CM | POA: Diagnosis not present

## 2017-10-21 ENCOUNTER — Encounter (HOSPITAL_COMMUNITY): Payer: Medicare Other

## 2017-10-25 ENCOUNTER — Telehealth: Payer: Self-pay | Admitting: Internal Medicine

## 2017-10-25 NOTE — Telephone Encounter (Signed)
1 Vial Order Date: 10/25/17 Shipping Date: 10/25/17

## 2017-10-26 ENCOUNTER — Encounter (HOSPITAL_COMMUNITY)
Admission: RE | Admit: 2017-10-26 | Discharge: 2017-10-26 | Disposition: A | Discharge status: Home / Self Care | Payer: Medicare Other | Source: Ambulatory Visit | Attending: Internal Medicine | Admitting: Internal Medicine

## 2017-10-26 DIAGNOSIS — G4733 Obstructive sleep apnea (adult) (pediatric): Secondary | ICD-10-CM | POA: Diagnosis not present

## 2017-10-26 DIAGNOSIS — F329 Major depressive disorder, single episode, unspecified: Secondary | ICD-10-CM | POA: Diagnosis not present

## 2017-10-26 DIAGNOSIS — I4892 Unspecified atrial flutter: Secondary | ICD-10-CM | POA: Diagnosis not present

## 2017-10-26 DIAGNOSIS — I480 Paroxysmal atrial fibrillation: Secondary | ICD-10-CM | POA: Diagnosis not present

## 2017-10-26 DIAGNOSIS — J4550 Severe persistent asthma, uncomplicated: Secondary | ICD-10-CM | POA: Diagnosis not present

## 2017-10-26 DIAGNOSIS — F419 Anxiety disorder, unspecified: Secondary | ICD-10-CM | POA: Diagnosis not present

## 2017-10-26 NOTE — Telephone Encounter (Signed)
1 vial Arrival Date:10/26/17 Lot #: [id number] Exp Date:03/2021

## 2017-10-26 NOTE — Progress Notes (Signed)
Daily Session Note  Patient Details  Name: [name] [name] MRN: [id number] Date of Birth: 05/18/1940 Referring Provider:     PULMONARY REHAB OTHER RESPIRATORY from 10/01/2017 in [place]  Referring Provider  Dr. Annamaria Boots      Encounter Date: 10/26/2017  Check In: Session Check In - 10/26/17 1330      Check-In   Supervising physician immediately available to respond to emergencies  See telemetry face sheet for immediately available MD    Location  [facility]    Staff Present  Russella Dar, MS, EP, [facility], Exercise Physiologist    Medication changes reported      No    Fall or balance concerns reported     No    Warm-up and Cool-down  Performed as group-led instruction    Resistance Training Performed  Yes    VAD Patient?  No    PAD/SET Patient?  No      Pain Assessment   Currently in Pain?  No/denies    Pain Score  0-No pain    Multiple Pain Sites  No       Capillary Blood Glucose: No results found for this or any previous visit (from the past 24 hour(s)).    Social History   Tobacco Use  Smoking Status Never Smoker  Smokeless Tobacco Never Used    Goals Met:  Proper associated with RPD/PD & O2 Sat Improved SOB with ADL's Exercise tolerated well Personal goals reviewed No report of cardiac concerns or symptoms Strength training completed today  Goals Unmet:  Not Applicable  Comments: Check out: 1430   Dr. Sinda Du is Medical Director for [facility] Pul[facility] Re[facility].

## 2017-10-27 ENCOUNTER — Encounter: Payer: Self-pay | Admitting: Internal Medicine

## 2017-10-28 ENCOUNTER — Encounter (HOSPITAL_COMMUNITY)
Admission: RE | Admit: 2017-10-28 | Discharge: 2017-10-28 | Disposition: A | Discharge status: Home / Self Care | Payer: Medicare Other | Source: Ambulatory Visit | Attending: Internal Medicine | Admitting: Internal Medicine

## 2017-10-28 ENCOUNTER — Ambulatory Visit (INDEPENDENT_AMBULATORY_CARE_PROVIDER_SITE_OTHER): Payer: Medicare Other | Admitting: Internal Medicine

## 2017-10-28 ENCOUNTER — Encounter: Payer: Self-pay | Admitting: Internal Medicine

## 2017-10-28 DIAGNOSIS — I10 Essential (primary) hypertension: Secondary | ICD-10-CM | POA: Insufficient documentation

## 2017-10-28 DIAGNOSIS — G4733 Obstructive sleep apnea (adult) (pediatric): Secondary | ICD-10-CM | POA: Insufficient documentation

## 2017-10-28 DIAGNOSIS — R0602 Shortness of breath: Secondary | ICD-10-CM

## 2017-10-28 DIAGNOSIS — E669 Obesity, unspecified: Secondary | ICD-10-CM | POA: Insufficient documentation

## 2017-10-28 DIAGNOSIS — Z7901 Long term (current) use of anticoagulants: Secondary | ICD-10-CM | POA: Diagnosis not present

## 2017-10-28 DIAGNOSIS — F329 Major depressive disorder, single episode, unspecified: Secondary | ICD-10-CM | POA: Diagnosis not present

## 2017-10-28 DIAGNOSIS — Z79899 Other long term (current) drug therapy: Secondary | ICD-10-CM | POA: Diagnosis not present

## 2017-10-28 DIAGNOSIS — F419 Anxiety disorder, unspecified: Secondary | ICD-10-CM | POA: Insufficient documentation

## 2017-10-28 DIAGNOSIS — K219 Gastro-esophageal reflux disease without esophagitis: Secondary | ICD-10-CM

## 2017-10-28 DIAGNOSIS — I480 Paroxysmal atrial fibrillation: Secondary | ICD-10-CM | POA: Insufficient documentation

## 2017-10-28 DIAGNOSIS — E785 Hyperlipidemia, unspecified: Secondary | ICD-10-CM | POA: Diagnosis not present

## 2017-10-28 DIAGNOSIS — J449 Chronic obstructive pulmonary disease, unspecified: Secondary | ICD-10-CM | POA: Insufficient documentation

## 2017-10-28 DIAGNOSIS — J4550 Severe persistent asthma, uncomplicated: Secondary | ICD-10-CM | POA: Diagnosis not present

## 2017-10-28 DIAGNOSIS — B370 Candidal stomatitis: Secondary | ICD-10-CM | POA: Diagnosis not present

## 2017-10-28 DIAGNOSIS — I4892 Unspecified atrial flutter: Secondary | ICD-10-CM | POA: Insufficient documentation

## 2017-10-28 DIAGNOSIS — J4521 Mild intermittent asthma with (acute) exacerbation: Secondary | ICD-10-CM | POA: Diagnosis not present

## 2017-10-28 MED ORDER — NYSTATIN 100000 UNIT/ML MT SUSP: 5.0000 mL | Freq: Four times a day (QID) | OROMUCOSAL | 0 refills | Status: DC

## 2017-10-28 MED ORDER — FLUTICASONE-UMECLIDIN-VILANT 100-62.5-25 MCG/INH IN AEPB: 1.0000 | INHALATION_SPRAY | Freq: Every day | RESPIRATORY_TRACT | 0 refills | Status: DC

## 2017-10-28 NOTE — Assessment & Plan Note (Signed)
She knows she benefits, sleeping better with CPAP.  Download confirms excellent compliance and control. Plan-continue CPAP 9

## 2017-10-28 NOTE — Assessment & Plan Note (Signed)
She thinks Trelegy is helping more than previous inhalers.  We discussed mouth care.  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

## 2017-10-28 NOTE — Patient Instructions (Signed)
Script sent for Nystatin swish and swallow mouth rinse for yeast in your mouth (thrush).  Ok to continue other meds.  Continue CPAP 9, mask of choice, humidifier, supplies, AirView  Please call if we can help

## 2017-10-28 NOTE — Assessment & Plan Note (Signed)
She can feel at least sometimes when she goes into A. fib and recognizes these episodes contribute to dyspnea.

## 2017-10-28 NOTE — Progress Notes (Signed)
Daily Session Note  Patient Details  Name: [name] [name] MRN: [id number] Date of Birth: November 23, 1940 Referring Provider:     PULMONARY REHAB OTHER RESPIRATORY from 10/01/2017 in [place]  Referring Provider  Dr. Annamaria Boots      Encounter Date: 10/28/2017  Check In: Session Check In - 10/28/17 1330      Check-In   Supervising physician immediately available to respond to emergencies  See telemetry face sheet for immediately available MD    Location  [facility]    Staff Present  Russella Dar, MS, EP, [facility], Exercise Physiologist    Medication changes reported      No    Fall or balance concerns reported     No    Warm-up and Cool-down  Performed as group-led instruction    Resistance Training Performed  Yes    VAD Patient?  No    PAD/SET Patient?  No      Pain Assessment   Currently in Pain?  No/denies    Pain Score  0-No pain    Multiple Pain Sites  No       Capillary Blood Glucose: No results found for this or any previous visit (from the past 24 hour(s)).    Social History   Tobacco Use  Smoking Status Never Smoker  Smokeless Tobacco Never Used    Goals Met:  Proper associated with RPD/PD & O2 Sat Independence with exercise equipment Improved SOB with ADL's Using PLB without cueing & demonstrates good technique Exercise tolerated well Personal goals reviewed No report of cardiac concerns or symptoms Strength training completed today  Goals Unmet:  Not Applicable  Comments: Check out: 1430   Dr. Sinda Du is Medical Director for [facility] Pulmonary Rehab.

## 2017-10-28 NOTE — Assessment & Plan Note (Signed)
Head of bed is elevated and she continues Prilosec each morning, Pepcid each evening but still recognizes episodes of acid heartburn.  We think this likely contributes to her cough.

## 2017-10-28 NOTE — Assessment & Plan Note (Signed)
Multifactorial dyspnea including components of asthma, A. fib, obesity hypoventilation. Plan-continue weight loss efforts.

## 2017-10-28 NOTE — Progress Notes (Signed)
HPI female never smoker followed for OSA, allergic rhinitis, asthma, complicated by A. Fib/Coumadin, HBP, GERD, obesity NPSG prior to EMR in 2012 Office Spirometry 09/02/16-WNL. FVC 2.06/76%, FEV1 1.57/77%, ratio 0.76, FEF 25-75% 1.26/80% Allergy labs 09/02/16- eosinophils 200, total IgE 4230 causing elevation of all specific allergen antibody levels including Aspergillus. Nucala  + 2018 PFT-10/13/16-minimal obstruction without response to dilator, minimal reduction of diffusion. FVC 2.02/77%, FEV1 1.62/83%, ratio 0.80, TLC 92%, DLCO 77% CT soft tissue neck 09/22/16-1. 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 Subsegmental atelectasis and nonspecific linear patchy densities in the lungs as described. They have a benign appearance.  Small hyperdense masses in the mediastinum are stable compared with 2015 supporting benign etiology such as ectopic thyroid tissue. Three-vessel prominent coronary artery calcification. Bibasilar bronchiolectasis. -------------------------------------------------------------------------------------------  08/20/2017- 77 year old female never smoker followed for OSA, allergic rhinitis, asthma/ Nucala, complicated by A. Fib/Coumadin, HBP, GERD, obesity CPAP 9/Advanced Nucala  Anoro, Singulair, Neb Duoneb/ Pulmicort  ----Cpap is going well no problems at this time. Coughing is bad right now  somtimes dry and at other times productive.  Coughing had been very much better for some months but recently worse again for unknown reasons.  She admits pollen might be a factor.  Does worse if lying back in recliner.  Occasional scant sputum is usually clear.  Often has no problems when lying down.  Cold air and cold drinks tend to trigger and she always feels the source in her upper trachea or throat area.  Using her nebulizer DuoNeb about twice most days.  She does not recognize reflux or heartburn. Pending another  cardioversion.  10/28/2017- 77 year old female never smoker followed for OSA, allergic rhinitis, asthma/ Nucala, complicated by A. Fib/Coumadin, HBP, GERD, obesity CPAP 9/Advanced Download 100% compliance AHI 2.4/hour Nucala  Anoro, Singulair, Neb Duoneb/ Pulmicort, Hydromet cough syrup, oPrilosec/Pepcid   pulmonary Rehab Labs 09/20/2017-WBC 14,900, hemoglobin 11.2, eosinophils absent, lymphocytes low( ?steroids or viral?), BNP 220, CMET wnl ------ Asthma: Pt states she continues to have cough-at times its due to GERD and other times feels that it is not.  Would like to have Rx refill on Hydromet-last filled 09-16-17.  Some days is more aware of acid reflux. She recognizes episodes when she goes into atrial fib, once triggered by light exertion in her garden.  Other times she recognizes acid reflux.  Episode of "bad sore throat" last week treated by Dr. Manuella Ghazi with Z-Pak and cortisone injection.  Residual postnasal drip and some pressure sensation left retro-orbital.  Thinks Trelegy helps.  Head of bed is up.  Started pulmonary rehabilitation at [facility] with no change in exertional dyspnea yet. CXR 09/20/2017 IMPRESSION: No active cardiopulmonary disease.  ROS-see HPI   + = positive Constitutional:   No-   weight loss, night sweats, fevers, chills, fatigue, lassitude. HEENT:   No-  headaches, difficulty swallowing, tooth/dental problems, sore throat,       No-  sneezing, itching,  +ear ache,  +nasal congestion, +post nasal drip,  CV:  No-   chest pain, orthopnea, PND, swelling in lower extremities, anasarca, dizziness, palpitations Resp: +  shortness of breath with exertion or at rest.                productive cough,  + non-productive cough,  No- coughing up of blood.              No-change in color of mucus. + wheezing.   Skin: No-  rash or lesions. GI:  No-   heartburn, indigestion, abdominal pain, nausea, vomiting, GU:  MS:  No-   joint pain or swelling. . Neuro-     nothing unusual Psych:   No- change in mood or affect. No depression or anxiety.  No memory loss.  OBJ- Physical Exam General- Alert, Oriented, Affect-appropriate, Distress- none acute, + obese Skin- rash-none, lesions- none, excoriation- none Lymphadenopathy- none Head- atraumatic            Eyes- Gross vision intact, PERRLA, conjunctivae and secretions clear            Ears- Hearing, canals-normal            Nose- +turbinate edema, no-Septal dev, mucus, polyps, erosion, perforation             Throat- Mallampati III-IV , mucosa+ thrush,  drainage- none, tonsils- atrophic Neck- flexible , trachea midline, no stridor , thyroid nl, carotid no bruit Chest - symmetrical excursion , unlabored           Heart/CV- RR/ very faint (no pacemaker) , no murmur , no gallop  , no rub, nl  s1 s2                  - JVD- none , edema- none, stasis changes- none, varices- none           Lung-  wheeze-none , cough-none                        dullness-none, rub- none           Chest wall-  Abd- Br/ Gen/ Rectal- Not done, not indicated Extrem- cyanosis- none, clubbing, none, atrophy- none, strength- nl Neuro- grossly intact to observation

## 2017-10-28 NOTE — Assessment & Plan Note (Signed)
She may have minimal maxillary sinusitis now, but we discussed thrush, will treat with Nystatin, and avoid additional antibiotics or steroids now

## 2017-11-02 ENCOUNTER — Ambulatory Visit: Payer: Medicare Other

## 2017-11-02 ENCOUNTER — Encounter (HOSPITAL_COMMUNITY)
Admission: RE | Admit: 2017-11-02 | Discharge: 2017-11-02 | Disposition: A | Discharge status: Home / Self Care | Payer: Medicare Other | Source: Ambulatory Visit | Attending: Internal Medicine | Admitting: Internal Medicine

## 2017-11-02 DIAGNOSIS — J4550 Severe persistent asthma, uncomplicated: Secondary | ICD-10-CM

## 2017-11-02 DIAGNOSIS — G4733 Obstructive sleep apnea (adult) (pediatric): Secondary | ICD-10-CM | POA: Diagnosis not present

## 2017-11-02 DIAGNOSIS — I4892 Unspecified atrial flutter: Secondary | ICD-10-CM | POA: Diagnosis not present

## 2017-11-02 DIAGNOSIS — I480 Paroxysmal atrial fibrillation: Secondary | ICD-10-CM | POA: Diagnosis not present

## 2017-11-02 DIAGNOSIS — F329 Major depressive disorder, single episode, unspecified: Secondary | ICD-10-CM | POA: Diagnosis not present

## 2017-11-02 DIAGNOSIS — F419 Anxiety disorder, unspecified: Secondary | ICD-10-CM | POA: Diagnosis not present

## 2017-11-02 NOTE — Progress Notes (Signed)
Daily Session Note  Patient Details  Name: AZAYLEA MAVES MRN: [id number] Date of Birth: 28-Dec-1940 Referring Provider:     PULMONARY REHAB OTHER RESPIRATORY from 10/01/2017 in [place]  Referring Provider  Dr. Annamaria Boots      Encounter Date: 11/02/2017  Check In: Session Check In - 11/02/17 1330      Check-In   Supervising physician immediately available to respond to emergencies  See telemetry face sheet for immediately available MD    Location  [facility]    Staff Present  Russella Dar, MS, EP, [facility], Exercise Physiologist    Medication changes reported      No    Fall or balance concerns reported     No    Warm-up and Cool-down  Performed as group-led instruction    Resistance Training Performed  Yes    VAD Patient?  No    PAD/SET Patient?  No      Pain Assessment   Currently in Pain?  No/denies    Pain Score  0-No pain    Multiple Pain Sites  No       Capillary Blood Glucose: No results found for this or any previous visit (from the past 24 hour(s)).  Exercise Prescription Changes - 11/02/17 0800      Response to Exercise   Blood Pressure (Admit)  114/60    Blood Pressure (Exercise)  148/54    Blood Pressure (Exit)  118/60    Heart Rate (Admit)  61 bpm    Heart Rate (Exercise)  80 bpm    Heart Rate (Exit)  67 bpm    Oxygen Saturation (Admit)  98 %    Oxygen Saturation (Exercise)  96 %    Oxygen Saturation (Exit)  96 %    Rating of Perceived Exertion (Exercise)  11    Perceived Dyspnea (Exercise)  12    Duration  Progress to 30 minutes of  aerobic without signs/symptoms of physical distress    Intensity  THRR New [phone]      Progression   Progression  Continue to progress workloads to maintain intensity without signs/symptoms of physical distress.    Average METs  2.55      Resistance Training   Training Prescription  Yes    Weight  2    Reps  10-15    Time  5 Minutes      NuStep   Level  2    SPM  100    Minutes  15    METs  2.7      Arm Ergometer   Level  1.3    Watts  19    RPM  67    Minutes  20    METs  2.4      Home Exercise Plan   Plans to continue exercise at  Home (comment)    Frequency  Add 3 additional days to program exercise sessions.    Initial Home Exercises Provided  10/18/17       Social History   Tobacco Use  Smoking Status Never Smoker  Smokeless Tobacco Never Used    Goals Met:  Proper associated with RPD/PD & O2 Sat Independence with exercise equipment Improved SOB with ADL's Using PLB without cueing & demonstrates good technique Exercise tolerated well Personal goals reviewed No report of cardiac concerns or symptoms Strength training completed today  Goals Unmet:  Not Applicable  Comments: check out: 1430   Dr. Sinda Du  is Market researcher for [employer].

## 2017-11-03 ENCOUNTER — Telehealth: Payer: Self-pay | Admitting: Internal Medicine

## 2017-11-03 ENCOUNTER — Ambulatory Visit (INDEPENDENT_AMBULATORY_CARE_PROVIDER_SITE_OTHER): Payer: Medicare Other

## 2017-11-03 DIAGNOSIS — J4550 Severe persistent asthma, uncomplicated: Secondary | ICD-10-CM

## 2017-11-03 NOTE — Telephone Encounter (Signed)
Katie retrieved paperwork; encounter can be closed

## 2017-11-04 ENCOUNTER — Encounter (HOSPITAL_COMMUNITY)
Admission: RE | Admit: 2017-11-04 | Discharge: 2017-11-04 | Disposition: A | Discharge status: Home / Self Care | Payer: Medicare Other | Source: Ambulatory Visit | Attending: Internal Medicine | Admitting: Internal Medicine

## 2017-11-04 DIAGNOSIS — G4733 Obstructive sleep apnea (adult) (pediatric): Secondary | ICD-10-CM | POA: Diagnosis not present

## 2017-11-04 DIAGNOSIS — J4550 Severe persistent asthma, uncomplicated: Secondary | ICD-10-CM | POA: Diagnosis not present

## 2017-11-04 DIAGNOSIS — I480 Paroxysmal atrial fibrillation: Secondary | ICD-10-CM | POA: Diagnosis not present

## 2017-11-04 DIAGNOSIS — F329 Major depressive disorder, single episode, unspecified: Secondary | ICD-10-CM | POA: Diagnosis not present

## 2017-11-04 DIAGNOSIS — I4892 Unspecified atrial flutter: Secondary | ICD-10-CM | POA: Diagnosis not present

## 2017-11-04 DIAGNOSIS — F419 Anxiety disorder, unspecified: Secondary | ICD-10-CM | POA: Diagnosis not present

## 2017-11-04 NOTE — Progress Notes (Addendum)
Pulmonary Individual Treatment Plan  Patient Details  Name: [name] [name] MRN: [id number] Date of Birth: April 01, 1940 Referring Provider:     PULMONARY REHAB OTHER RESPIRATORY from 10/01/2017 in [place]  Referring Provider  Dr. Annamaria Boots      Initial Encounter Date:    PULMONARY REHAB OTHER RESPIRATORY from 10/01/2017 in [place]  Date  10/01/17      Visit Diagnosis: Asthma in adult, severe persistent, uncomplicated  Patient's Home Medications on Admission:   Current Outpatient Medications:  .  acetaminophen (TYLENOL) 500 MG tablet, Take 500 mg by mouth daily as needed for headache., Disp: , Rfl:  .  antiseptic oral rinse (BIOTENE) LIQD, 15 mLs by Mouth Rinse route as needed for dry mouth., Disp: , Rfl:  .  atorvastatin (LIPITOR) 10 MG tablet, Take 10 mg by mouth every evening. , Disp: , Rfl:  .  baclofen (LIORESAL) 10 MG tablet, Take 10 mg by mouth as needed for muscle spasms., Disp: , Rfl:  .  benzonatate (TESSALON) 200 MG capsule, Take 1 capsule (200 mg total) by mouth 3 (three) times daily as needed for cough., Disp: 30 capsule, Rfl: 3 .  Biotin 5000 MCG CAPS, Take 5,000 mcg by mouth daily., Disp: , Rfl:  .  budesonide (PULMICORT) 0.5 MG/2ML nebulizer solution, Take 0.5 mg by nebulization 2 (two) times daily. , Disp: , Rfl:  .  cetirizine (ZYRTEC) 10 MG tablet, Take 10 mg by mouth daily as needed for allergies., Disp: , Rfl:  .  clindamycin (CLEOCIN) 300 MG capsule, 2 capsules as needed. Take prior to dental procedures, Disp: , Rfl: 0 .  cycloSPORINE (RESTASIS) 0.05 % ophthalmic emulsion, Place 1 drop 2 (two) times daily into both eyes. , Disp: , Rfl:  .  diltiazem (CARTIA XT) 120 MG 24 hr capsule, Take 120 mg by mouth at bedtime. , Disp: , Rfl:  .  dofetilide (TIKOSYN) 500 MCG capsule, Take 1 capsule (500 mcg total) by mouth 2 (two) times daily., Disp: 60 capsule, Rfl: 6 .  famotidine (PEPCID) 20 MG tablet, Take 1 tablet (20 mg  total) by mouth at bedtime., Disp: 30 tablet, Rfl: 2 .  fluticasone (FLONASE) 50 MCG/ACT nasal spray, Place 1 spray into both nostrils 2 (two) times daily., Disp: , Rfl:  .  Fluticasone-Umeclidin-Vilant (TRELEGY ELLIPTA) 100-62.5-25 MCG/INH AEPB, Inhale 1 puff into the lungs daily., Disp: 1 each, Rfl: 0 .  HYDROcodone-homatropine (HYDROMET) 5-1.5 MG/5ML syrup, Take 5 mLs by mouth every 6 (six) hours as needed for cough., Disp: 120 mL, Rfl: 0 .  Hypromellose (ARTIFICIAL TEARS OP), Apply 1 drop to eye every 4 (four) hours as needed (dry eyes). , Disp: , Rfl:  .  ipratropium (ATROVENT) 0.06 % nasal spray, Place 2 sprays into both nostrils 4 (four) times daily as needed for rhinitis. , Disp: , Rfl:  .  ipratropium-albuterol (DUONEB) 0.5-2.5 (3) MG/3ML SOLN, Take 3 mLs by nebulization 2 (two) times daily. , Disp: , Rfl:  .  losartan (COZAAR) 100 MG tablet, Take 1 tablet (100 mg total) by mouth daily., Disp: 90 tablet, Rfl: 2 .  montelukast (SINGULAIR) 10 MG tablet, Take 10 mg by mouth at bedtime., Disp: , Rfl:  .  nystatin (MYCOSTATIN) 100000 UNIT/ML suspension, Take 5 mLs (500,000 Units total) by mouth 4 (four) times daily., Disp: 60 mL, Rfl: 0 .  omeprazole (PRILOSEC) 20 MG capsule, Take 20 mg by mouth daily., Disp: , Rfl:  .  potassium chloride SA (K-DUR,KLOR-CON)  20 MEQ tablet, Take 1 tablet (20 mEq total) by mouth daily., Disp: 30 tablet, Rfl: 6 .  sodium chloride (OCEAN) 0.65 % SOLN nasal spray, Place 1 spray into both nostrils 2 (two) times daily. , Disp: , Rfl:  .  triamcinolone ointment (KENALOG) 0.1 %, Apply 1 application topically daily as needed for rash., Disp: , Rfl: 2 .  WARFARIN SODIUM PO, Take by mouth as directed., Disp: , Rfl:  .  Wheat Dextrin (BENEFIBER ON THE GO) POWD, Take 1 Dose by mouth daily as needed (constipation). , Disp: , Rfl:   Current Facility-Administered Medications:  Marland Kitchen  Mepolizumab SOLR 100 mg, 100 mg, Subcutaneous, Q28 days, Young, Clinton D, MD, 100 mg at 08/20/17  0850 .  Mepolizumab SOLR 100 mg, 100 mg, Subcutaneous, Q28 days, Young, Clinton D, MD, 100 mg at 11/03/17 1037  Past Medical History: Past Medical History:  Diagnosis Date  . Allergic rhinitis   . Anxiety   . Asthma   . Atrial flutter ([place])   . COPD (chronic obstructive pulmonary disease) ([place])   . Depression   . Essential hypertension   . Hyperlipidemia   . Lumbar disc disease   . Obesity   . OSA (obstructive sleep apnea)    CPAP  . Paroxysmal atrial fibrillation ([facility])    Element of tachycardia bradycardia syndrome  . Respiratory failure ([facility])     Tobacco Use: Social History   Tobacco Use  Smoking Status Never Smoker  Smokeless Tobacco Never Used    Labs: Recent Review Flowsheet Data    There is no flowsheet data to display.      Capillary Blood Glucose: No results found for: GLUCAP   Pulmonary Assessment Scores: Pulmonary Assessment Scores    Row Name 10/01/17 1124         ADL UCSD   ADL Phase  Entry     SOB Score total  46     Rest  0     Walk  9     Stairs  4     Bath  2     Dress  2     Shop  2       CAT Score   CAT Score  19       mMRC Score   mMRC Score  3        Pulmonary Function Assessment: Pulmonary Function Assessment - 10/01/17 1123      Pulmonary Function Tests   FVC%  79 %    FEV1%  78 %    FEV1/FVC Ratio  99    RV%  92 %    DLCO%  81 %      Initial Spirometry Results   FVC%  79 %    FEV1%  78 %    FEV1/FVC Ratio  99      Post Bronchodilator Spirometry Results   FVC%  77 %    FEV1%  83 %    FEV1/FVC Ratio  108      Breath   Bilateral Breath Sounds  Clear    Shortness of Breath  Yes       Exercise Target Goals:    Exercise Program Goal: Individual exercise prescription set using results from initial 6 min walk test and THRR while considering  patient's activity barriers and safety.   Exercise Prescription Goal: Initial exercise prescription builds to 30-45 minutes a day of aerobic activity, 2-3 days per  week.  Home exercise guidelines will be given  to patient during program as part of exercise prescription that the participant will acknowledge.  Activity Barriers & Risk Stratification: Activity Barriers & Cardiac Risk Stratification - 10/01/17 1007      Activity Barriers & Cardiac Risk Stratification   Activity Barriers  Back Problems;Deconditioning;Shortness of Breath;Left Hip Replacement;Right Hip Replacement;Other (comment)    Comments  Patient great toe and 2nd toes are crossed. has been told by her doctor not to do activities that make her go up on her toes.     Cardiac Risk Stratification  Moderate       6 Minute Walk: 6 Minute Walk    Row Name 10/01/17 1006         6 Minute Walk   Phase  Initial     Distance  900 feet     Distance % Change  0 %     Distance Feet Change  0 ft     Walk Time  6 minutes     # of Rest Breaks  0     MPH  1.7     METS  2.3     RPE  13     Perceived Dyspnea   14     VO2 Peak  6.24     Symptoms  Yes (comment)     Comments  Patient had to push a wheelchair half way through walk test due to extreme shortness of breath     Resting HR  62 bpm     Resting BP  124/52     Resting Oxygen Saturation   97 %     Exercise Oxygen Saturation  during 6 min walk  98 %     Max Ex. HR  97 bpm     Max Ex. BP  200/62     2 Minute Post BP  154/60        Oxygen Initial Assessment: Oxygen Initial Assessment - 10/01/17 1127      Home Oxygen   Home Oxygen Device  None    Sleep Oxygen Prescription  None    Home Exercise Oxygen Prescription  None    Home at Rest Exercise Oxygen Prescription  None    Compliance with Home Oxygen Use  --   N/A     Initial 6 min Walk   Oxygen Used  None      Program Oxygen Prescription   Program Oxygen Prescription  None       Oxygen Re-Evaluation: Oxygen Re-Evaluation    Row Name 11/04/17 0742             Program Oxygen Prescription   Program Oxygen Prescription  None         Home Oxygen   Home Oxygen Device   None       Sleep Oxygen Prescription  None       Home at Rest Exercise Oxygen Prescription  None       Compliance with Home Oxygen Use  Yes         Goals/Expected Outcomes   Short Term Goals  To learn and understand importance of maintaining oxygen saturations>88%;To learn and understand importance of monitoring SPO2 with pulse oximeter and demonstrate accurate use of the pulse oximeter.;To learn and demonstrate proper pursed lip breathing techniques or other breathing techniques.       Long  Term Goals  Maintenance of O2 saturations>88%;Verbalizes importance of monitoring SPO2 with pulse oximeter and return demonstration;Exhibits proper breathing techniques, such as pursed lip breathing  or other method taught during program session       Comments  Patient verbalizes understanding of maintaining O2 saturation >88% and is able to use pulse oximeter with return demonstration. She exhibits proper pursed lip breathing techniques during exercise.        Goals/Expected Outcomes  Patient will continue to meet her short and long term goals.           Oxygen Discharge (Final Oxygen Re-Evaluation): Oxygen Re-Evaluation - 11/04/17 0742      Program Oxygen Prescription   Program Oxygen Prescription  None      Home Oxygen   Home Oxygen Device  None    Sleep Oxygen Prescription  None    Home at Rest Exercise Oxygen Prescription  None    Compliance with Home Oxygen Use  Yes      Goals/Expected Outcomes   Short Term Goals  To learn and understand importance of maintaining oxygen saturations>88%;To learn and understand importance of monitoring SPO2 with pulse oximeter and demonstrate accurate use of the pulse oximeter.;To learn and demonstrate proper pursed lip breathing techniques or other breathing techniques.    Long  Term Goals  Maintenance of O2 saturations>88%;Verbalizes importance of monitoring SPO2 with pulse oximeter and return demonstration;Exhibits proper breathing techniques, such as pursed  lip breathing or other method taught during program session    Comments  Patient verbalizes understanding of maintaining O2 saturation >88% and is able to use pulse oximeter with return demonstration. She exhibits proper pursed lip breathing techniques during exercise.     Goals/Expected Outcomes  Patient will continue to meet her short and long term goals.        Initial Exercise Prescription: Initial Exercise Prescription - 10/01/17 1000      Date of Initial Exercise RX and Referring Provider   Date  10/01/17    Referring Provider  Dr. Annamaria Boots      NuStep   Level  1    SPM  77    Minutes  15    METs  1.8      Arm Ergometer   Level  1.2    Watts  16    RPM  22    Minutes  20    METs  2.2      Prescription Details   Frequency (times per week)  2    Duration  Progress to 30 minutes of continuous aerobic without signs/symptoms of physical distress      Intensity   THRR 40-80% of Max Heartrate  [phone]    Ratings of Perceived Exertion  11-13    Perceived Dyspnea  0-4      Progression   Progression  Continue progressive overload as per policy without signs/symptoms or physical distress.      Resistance Training   Training Prescription  Yes    Weight  1    Reps  10-15       Perform Capillary Blood Glucose checks as needed.  Exercise Prescription Changes:  Exercise Prescription Changes    Row Name 10/17/17 1200 11/02/17 0800           Response to Exercise   Blood Pressure (Admit)  144/60  114/60      Blood Pressure (Exercise)  158/60  148/54      Blood Pressure (Exit)  146/60  118/60      Heart Rate (Admit)  64 bpm  61 bpm      Heart Rate (Exercise)  83 bpm  80  bpm      Heart Rate (Exit)  68 bpm  67 bpm      Oxygen Saturation (Admit)  97 %  98 %      Oxygen Saturation (Exercise)  97 %  96 %      Oxygen Saturation (Exit)  95 %  96 %      Rating of Perceived Exertion (Exercise)  11  11      Perceived Dyspnea (Exercise)  12  12      Duration  Progress to 30  minutes of  aerobic without signs/symptoms of physical distress  Progress to 30 minutes of  aerobic without signs/symptoms of physical distress      Intensity  THRR New 96-111-127  THRR New 93-110-126        Progression   Progression  Continue to progress workloads to maintain intensity without signs/symptoms of physical distress.  Continue to progress workloads to maintain intensity without signs/symptoms of physical distress.      Average METs  2.45  2.55        Resistance Training   Training Prescription  Yes  Yes      Weight  1  2      Reps  10-15  10-15      Time  5 Minutes  5 Minutes        NuStep   Level  1  2      SPM  99  100      Minutes  15  15      METs  2.6  2.7        Arm Ergometer   Level  1.2  1.3      Watts  18  19      RPM  23  67      Minutes  20  20      METs  2.3  2.4        Home Exercise Plan   Plans to continue exercise at  Home (comment)  Home (comment)      Frequency  Add 3 additional days to program exercise sessions.  Add 3 additional days to program exercise sessions.      Initial Home Exercises Provided  10/18/17  10/18/17         Exercise Comments:  Exercise Comments    Row Name 11/02/17 0844           Exercise Comments  Patient is breathing better. Able to do more and does feel like the program is helping her achieve her goals.           Exercise Goals and Review:  Exercise Goals    Row Name 10/01/17 1009             Exercise Goals   Increase Physical Activity  Yes       Intervention  Develop an individualized exercise prescription for aerobic and resistive training based on initial evaluation findings, risk stratification, comorbidities and participant's personal goals.;Provide advice, education, support and counseling about physical activity/exercise needs.       Expected Outcomes  Short Term: Attend rehab on a regular basis to increase amount of physical activity.       Increase Strength and Stamina  Yes       Intervention   Provide advice, education, support and counseling about physical activity/exercise needs.;Develop an individualized exercise prescription for aerobic and resistive training based on initial evaluation findings, risk stratification, comorbidities and participant's personal goals.  Expected Outcomes  Short Term: Increase workloads from initial exercise prescription for resistance, speed, and METs.       Able to understand and use rate of perceived exertion (RPE) scale  Yes       Intervention  Provide education and explanation on how to use RPE scale       Expected Outcomes  Short Term: Able to use RPE daily in rehab to express subjective intensity level;Long Term:  Able to use RPE to guide intensity level when exercising independently       Able to understand and use Dyspnea scale  Yes       Intervention  Provide education and explanation on how to use Dyspnea scale       Expected Outcomes  Short Term: Able to use Dyspnea scale daily in rehab to express subjective sense of shortness of breath during exertion;Long Term: Able to use Dyspnea scale to guide intensity level when exercising independently       Knowledge and understanding of Target Heart Rate Range (THRR)  Yes       Intervention  Provide education and explanation of THRR including how the numbers were predicted and where they are located for reference       Expected Outcomes  Short Term: Able to state/look up THRR;Short Term: Able to use daily as guideline for intensity in rehab;Long Term: Able to use THRR to govern intensity when exercising independently       Understanding of Exercise Prescription  Yes       Intervention  Provide education, explanation, and written materials on patient's individual exercise prescription       Expected Outcomes  Short Term: Able to explain program exercise prescription;Long Term: Able to explain home exercise prescription to exercise independently          Exercise Goals Re-Evaluation : Exercise Goals  Re-Evaluation    Row Name 10/04/17 1440 11/02/17 0840           Exercise Goal Re-Evaluation   Exercise Goals Review  Increase Physical Activity;Increase Strength and Stamina;Able to understand and use rate of perceived exertion (RPE) scale;Able to understand and use Dyspnea scale;Knowledge and understanding of Target Heart Rate Range (THRR);Understanding of Exercise Prescription  Increase Strength and Stamina;Increase Physical Activity;Able to understand and use rate of perceived exertion (RPE) scale;Able to understand and use Dyspnea scale;Understanding of Exercise Prescription;Knowledge and understanding of Target Heart Rate Range (THRR)      Comments  Patient has not yet started PR. Patient will be progressed in time in accordance with her goals.   Patient is progessing at a steady rate. Patient is able to handle increases in weights and equipment workloads.       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          Discharge Exercise Prescription (Final Exercise Prescription Changes): Exercise Prescription Changes - 11/02/17 0800      Response to Exercise   Blood Pressure (Admit)  114/60    Blood Pressure (Exercise)  148/54    Blood Pressure (Exit)  118/60    Heart Rate (Admit)  61 bpm    Heart Rate (Exercise)  80 bpm    Heart Rate (Exit)  67 bpm    Oxygen Saturation (Admit)  98 %  Oxygen Saturation (Exercise)  96 %    Oxygen Saturation (Exit)  96 %    Rating of Perceived Exertion (Exercise)  11    Perceived Dyspnea (Exercise)  12    Duration  Progress to 30 minutes of  aerobic without signs/symptoms of physical distress    Intensity  THRR New   93-110-126     Progression   Progression  Continue to progress workloads to maintain intensity without signs/symptoms of physical distress.    Average METs  2.55       Resistance Training   Training Prescription  Yes    Weight  2    Reps  10-15    Time  5 Minutes      NuStep   Level  2    SPM  100    Minutes  15    METs  2.7      Arm Ergometer   Level  1.3    Watts  19    RPM  67    Minutes  20    METs  2.4      Home Exercise Plan   Plans to continue exercise at  Home (comment)    Frequency  Add 3 additional days to program exercise sessions.    Initial Home Exercises Provided  10/18/17       Nutrition:  Target Goals: Understanding of nutrition guidelines, daily intake of sodium 1500mg , cholesterol 200mg , calories 30% from fat and 7% or less from saturated fats, daily to have 5 or more servings of fruits and vegetables.  Biometrics: Pre Biometrics - 10/01/17 1010      Pre Biometrics   Height  5\' 3"  (1.6 m)    Weight  92 kg    Waist Circumference  48 inches    Hip Circumference  39.5 inches    Waist to Hip Ratio  1.22 %    BMI (Calculated)  35.93    Triceps Skinfold  5 mm    % Body Fat  40.2 %    Grip Strength  51.06 kg    Flexibility  0 in    Single Leg Stand  7 seconds        Nutrition Therapy Plan and Nutrition Goals: Nutrition Therapy & Goals - 10/07/17 1503      Personal Nutrition Goals   Nutrition Goal  For heart healthy choices add >50% of whole grains, make half their plate fruits and vegetables. Discuss the difference between starchy vegetables and leafy greens, and how leafy vegetables provide fiber, helps maintain healthy weight, helps control blood glucose, and lowers cholesterol.  Discuss purchasing fresh or frozen vegetable to reduce sodium and not to add grease, fat or sugar. Consume <18oz of red meat per week. Consume lean cuts of meats and very little of meats high in sodium and nitrates such as pork and lunch meats. Discussed portion control for all food groups.        Intervention Plan   Intervention  Nutrition handout(s) given to patient.    Expected Outcomes  Short Term Goal: Understand basic  principles of dietary content, such as calories, fat, sodium, cholesterol and nutrients.       Nutrition Assessments: Nutrition Assessments - 10/01/17 1132      MEDFICTS Scores   Pre Score  54       Nutrition Goals Re-Evaluation: Nutrition Goals Re-Evaluation    [place] Name 11/04/17 0752             Goals  Current Weight  202 lb (91.6 kg)       Nutrition Goal  For heart healthy choices add >50% of whole grains, make half their plate fruits and vegetables. Discuss the difference between starchy vegetables and leafy greens, and how leafy vegetables provide fiber, helps maintain healthy weight, helps control blood glucose, and lowers cholesterol.  Discuss purchasing fresh or frozen vegetable to reduce sodium and not to add grease, fat or sugar. Consume <18oz of red meat per week. Consume lean cuts of meats and very little of meats high in sodium and nitrates such as pork and lunch meats. Discussed portion control for all food groups.         Comment  Patient says she is eating heart healthy and is working toward meeting her nutritional goals.        Expected Outcome  Patient will continue to work toward meeting her nutritional needes. Will continue to monitor for progress.          Personal Goal #2 Re-Evaluation   Personal Goal #2  Patient is working on eating heart healthy. Handout given for her to improve on her food choices.           Nutrition Goals Discharge (Final Nutrition Goals Re-Evaluation): Nutrition Goals Re-Evaluation - 11/04/17 0752      Goals   Current Weight  202 lb (91.6 kg)    Nutrition Goal  For heart healthy choices add >50% of whole grains, make half their plate fruits and vegetables. Discuss the difference between starchy vegetables and leafy greens, and how leafy vegetables provide fiber, helps maintain healthy weight, helps control blood glucose, and lowers cholesterol.  Discuss purchasing fresh or frozen vegetable to reduce sodium and not to add grease, fat or  sugar. Consume <18oz of red meat per week. Consume lean cuts of meats and very little of meats high in sodium and nitrates such as pork and lunch meats. Discussed portion control for all food groups.      Comment  Patient says she is eating heart healthy and is working toward meeting her nutritional goals.     Expected Outcome  Patient will continue to work toward meeting her nutritional needes. Will continue to monitor for progress.       Personal Goal #2 Re-Evaluation   Personal Goal #2  Patient is working on eating heart healthy. Handout given for her to improve on her food choices.        Psychosocial: Target Goals: Acknowledge presence or absence of significant depression and/or stress, maximize coping skills, provide positive support system. Participant is able to verbalize types and ability to use techniques and skills needed for reducing stress and depression.  Initial Review & Psychosocial Screening: Initial Psych Review & Screening - 10/01/17 1134      Initial Review   Current issues with  None Identified      Family Dynamics   Good Support System?  Yes      Barriers   Psychosocial barriers to participate in program  There are no identifiable barriers or psychosocial needs.      Screening Interventions   Interventions  Encouraged to exercise    Expected Outcomes  Short Term goal: Identification and review with participant of any Quality of Life or Depression concerns found by scoring the questionnaire.;Long Term goal: The participant improves quality of Life and PHQ9 Scores as seen by post scores and/or verbalization of changes       Quality of Life Scores: Quality of  Life - 10/01/17 1010      Quality of Life   Select  Quality of Life      Quality of Life Scores   Health/Function Pre  24.4 %    Socioeconomic Pre  26.75 %    Psych/Spiritual Pre  24.86 %    Family Pre  18 %    GLOBAL Pre  24.55 %      Scores of 19 and below usually indicate a poorer quality of life  in these areas.  A difference of  2-3 points is a clinically meaningful difference.  A difference of 2-3 points in the total score of the Quality of Life Index has been associated with significant improvement in overall quality of life, self-image, physical symptoms, and general health in studies assessing change in quality of life.   PHQ-9: Recent Review Flowsheet Data    Depression screen [facility] 2/9 10/01/2017   Decreased Interest 0   Down, Depressed, Hopeless 0   PHQ - 2 Score 0   Altered sleeping 2   Tired, decreased energy 2   Change in appetite 2   Feeling bad or failure about yourself  0   Trouble concentrating 0   Moving slowly or fidgety/restless 0   Suicidal thoughts 0   PHQ-9 Score 6   Difficult doing work/chores Not difficult at all     Interpretation of Total Score  Total Score Depression Severity:  1-4 = Minimal depression, 5-9 = Mild depression, 10-14 = Moderate depression, 15-19 = Moderately severe depression, 20-27 = Severe depression   Psychosocial Evaluation and Intervention: Psychosocial Evaluation - 10/01/17 1135      Psychosocial Evaluation & Interventions   Interventions  Encouraged to exercise with the program and follow exercise prescription    Continue Psychosocial Services   No Follow up required       Psychosocial Re-Evaluation: Psychosocial Re-Evaluation    [place] Name 11/04/17 0749             Psychosocial Re-Evaluation   Current issues with  None Identified       Comments  Patient initial QOL score was 18 and her PHQ-9 score was 6 with no psychosocial issues identified at orientation. Will continue to monitor.       Expected Outcomes  Patient will have no psychosocial issues identified at discharge.        Interventions  Relaxation education;Encouraged to attend Pulmonary Rehabilitation for the exercise;Stress management education       Continue Psychosocial Services   No Follow up required          Psychosocial Discharge (Final Psychosocial  Re-Evaluation): Psychosocial Re-Evaluation - 11/04/17 0749      Psychosocial Re-Evaluation   Current issues with  None Identified    Comments  Patient initial QOL score was 18 and her PHQ-9 score was 6 with no psychosocial issues identified at orientation. Will continue to monitor.    Expected Outcomes  Patient will have no psychosocial issues identified at discharge.     Interventions  Relaxation education;Encouraged to attend Pulmonary Rehabilitation for the exercise;Stress management education    Continue Psychosocial Services   No Follow up required        Education: Education Goals: Education classes will be provided on a weekly basis, covering required topics. Participant will state understanding/return demonstration of topics presented.  Learning Barriers/Preferences: Learning Barriers/Preferences - 10/01/17 1020      Learning Barriers/Preferences   Learning Barriers  None    Learning Preferences  Skilled Demonstration;Individual Instruction;Group Instruction       Education Topics: How Lungs Work and Diseases: - Discuss the anatomy of the lungs and diseases that can affect the lungs, such as COPD.   Exercise: -Discuss the importance of exercise, FITT principles of exercise, normal and abnormal responses to exercise, and how to exercise safely.   Environmental Irritants: -Discuss types of environmental irritants and how to limit exposure to environmental irritants.   Meds/Inhalers and oxygen: - Discuss respiratory medications, definition of an inhaler and oxygen, and the proper way to use an inhaler and oxygen.   Energy Saving Techniques: - Discuss methods to conserve energy and decrease shortness of breath when performing activities of daily living.    Bronchial Hygiene / Breathing Techniques: - Discuss breathing mechanics, pursed-lip breathing technique,  proper posture, effective ways to clear airways, and other functional breathing techniques   Cleaning  Equipment: - Provides group verbal and written instruction about the health risks of elevated stress, cause of high stress, and healthy ways to reduce stress.   Nutrition I: Fats: - Discuss the types of cholesterol, what cholesterol does to the body, and how cholesterol levels can be controlled.   Nutrition II: Labels: -Discuss the different components of food labels and how to read food labels.   Respiratory Infections: - Discuss the signs and symptoms of respiratory infections, ways to prevent respiratory infections, and the importance of seeking medical treatment when having a respiratory infection.   Stress I: Signs and Symptoms: - Discuss the causes of stress, how stress may lead to anxiety and depression, and ways to limit stress.   PULMONARY REHAB OTHER RESPIRATORY from 10/14/2017 in [place]  Date  10/07/17  Educator  DC  Instruction Review Code  2- Demonstrated Understanding      Stress II: Relaxation: -Discuss relaxation techniques to limit stress.   PULMONARY REHAB OTHER RESPIRATORY from 10/14/2017 in [place]  Date  10/14/17  Educator  DC  Instruction Review Code  2- Demonstrated Understanding      Oxygen for Home/Travel: - Discuss how to prepare for travel when on oxygen and proper ways to transport and store oxygen to ensure safety.   Knowledge Questionnaire Score: Knowledge Questionnaire Score - 10/01/17 1118      Knowledge Questionnaire Score   Pre Score  14/18       Core Components/Risk Factors/Patient Goals at Admission: Personal Goals and Risk Factors at Admission - 10/01/17 1132      Core Components/Risk Factors/Patient Goals on Admission    Weight Management  Obesity    Personal Goal Other  Yes    Personal Goal  Have more energy, breath better, get back to swimming, sing on the choir again.     Intervention  Attend CR 3 x week and supplement with at home exercise 2 x week.     Expected Outcomes   Achieve personal goals.        Core Components/Risk Factors/Patient Goals Review:  Goals and Risk Factor Review    Row Name 11/04/17 0745             Core Components/Risk Factors/Patient Goals Review   Personal Goals Review  Weight Management/Obesity;Improve shortness of breath with ADL's;Develop more efficient breathing techniques such as purse lipped breathing and diaphragmatic breathing and practicing self-pacing with activity. More energy; breathe better; get back to swimming; sing again.       Review  Patient has completed 8 sessions maintaining her weight. She  is doing well in the program with some progression. She says she does feel her energy has increased. She is able to demonstrate proper pursed lip breathing techniques in the program during exercise. She does not see any improvement in her breathing yet but hopes to continue to improve as she continues the program. Will continue to monitor for progress.        Expected Outcomes  Patient will continue to attend sessions and complete the program meeting her personal goals.           Core Components/Risk Factors/Patient Goals at Discharge (Final Review):  Goals and Risk Factor Review - 11/04/17 0745      Core Components/Risk Factors/Patient Goals Review   Personal Goals Review  Weight Management/Obesity;Improve shortness of breath with ADL's;Develop more efficient breathing techniques such as purse lipped breathing and diaphragmatic breathing and practicing self-pacing with activity.   More energy; breathe better; get back to swimming; sing again.   Review  Patient has completed 8 sessions maintaining her weight. She is doing well in the program with some progression. She says she does feel her energy has increased. She is able to demonstrate proper pursed lip breathing techniques in the program during exercise. She does not see any improvement in her breathing yet but hopes to continue to improve as she continues the program. Will  continue to monitor for progress.     Expected Outcomes  Patient will continue to attend sessions and complete the program meeting her personal goals.        ITP Comments: ITP Comments    Row Name 10/06/17 1353 10/07/17 1504         ITP Comments  Patient new to program. She has completed 2 sessions. Will continue to monitor for progress.   Patient attended the Family Matters class with hospital chaplian to discuss how this event has impacted their life.          Comments: ITP 30 Day REVIEW Pt is making expected progress toward pulmonary rehab goals after completing 8 sessions. Recommend continued exercise, life style modification, education, and utilization of breathing techniques to increase stamina and strength and decrease shortness of breath with exertion.

## 2017-11-04 NOTE — Progress Notes (Addendum)
Daily Session Note  Patient Details  Name: [name] [name] MRN: [id number] Date of Birth: 10/04/1940 Referring Provider:     PULMONARY REHAB OTHER RESPIRATORY from 10/01/2017 in [place] [facility]  Referring Provider  Dr. Young      Encounter Date: 11/04/2017  Check In: Session Check In - 11/04/17 1330      Check-In   Supervising physician immediately available to respond to emergencies  See telemetry face sheet for immediately available MD    Location  [facility]    Staff Present  Diane Coad, MS, EP, [facility], Exercise Physiologist;Debra Johnson, RN, BSN    Medication changes reported      No    Fall or balance concerns reported     No    Warm-up and Cool-down  Performed as group-led instruction    Resistance Training Performed  Yes    VAD Patient?  No    PAD/SET Patient?  No      Pain Assessment   Currently in Pain?  No/denies    Pain Score  0-No pain    Multiple Pain Sites  No       Capillary Blood Glucose: No results found for this or any previous visit (from the past 24 hour(s)).    Social History   Tobacco Use  Smoking Status Never Smoker  Smokeless Tobacco Never Used    Goals Met:  Proper associated with RPD/PD & O2 Sat Independence with exercise equipment Improved SOB with ADL's Using PLB without cueing & demonstrates good technique Exercise tolerated well No report of cardiac concerns or symptoms Strength training completed today  Goals Unmet:  Not Applicable  Comments: Check out 1430   Dr. Edward Hawkins is Medical Director for [facility]. 

## 2017-11-05 MED ORDER — MEPOLIZUMAB 100 MG ~~LOC~~ SOLR
100.0000 mg | SUBCUTANEOUS | Status: DC
  Administered 2017-11-03: 100 mg via SUBCUTANEOUS

## 2017-11-05 NOTE — Progress Notes (Signed)
Documentation of medication administration and charges of [place] have been completed by Desmond Dike, CMA based on the [facility] documentation sheet completed by Alroy Bailiff.

## 2017-11-08 DIAGNOSIS — Z6836 Body mass index (BMI) 36.0-36.9, adult: Secondary | ICD-10-CM | POA: Diagnosis not present

## 2017-11-08 DIAGNOSIS — B370 Candidal stomatitis: Secondary | ICD-10-CM | POA: Diagnosis not present

## 2017-11-08 DIAGNOSIS — I4891 Unspecified atrial fibrillation: Secondary | ICD-10-CM | POA: Diagnosis not present

## 2017-11-08 DIAGNOSIS — I10 Essential (primary) hypertension: Secondary | ICD-10-CM | POA: Diagnosis not present

## 2017-11-08 DIAGNOSIS — J449 Chronic obstructive pulmonary disease, unspecified: Secondary | ICD-10-CM | POA: Diagnosis not present

## 2017-11-08 DIAGNOSIS — Z299 Encounter for prophylactic measures, unspecified: Secondary | ICD-10-CM | POA: Diagnosis not present

## 2017-11-08 DIAGNOSIS — K130 Diseases of lips: Secondary | ICD-10-CM | POA: Diagnosis not present

## 2017-11-09 ENCOUNTER — Encounter (HOSPITAL_COMMUNITY)
Admission: RE | Admit: 2017-11-09 | Discharge: 2017-11-09 | Disposition: A | Discharge status: Home / Self Care | Payer: Medicare Other | Source: Ambulatory Visit | Attending: Internal Medicine | Admitting: Internal Medicine

## 2017-11-09 DIAGNOSIS — F329 Major depressive disorder, single episode, unspecified: Secondary | ICD-10-CM | POA: Diagnosis not present

## 2017-11-09 DIAGNOSIS — J4550 Severe persistent asthma, uncomplicated: Secondary | ICD-10-CM

## 2017-11-09 DIAGNOSIS — I4892 Unspecified atrial flutter: Secondary | ICD-10-CM | POA: Diagnosis not present

## 2017-11-09 DIAGNOSIS — I480 Paroxysmal atrial fibrillation: Secondary | ICD-10-CM | POA: Diagnosis not present

## 2017-11-09 DIAGNOSIS — F419 Anxiety disorder, unspecified: Secondary | ICD-10-CM | POA: Diagnosis not present

## 2017-11-09 DIAGNOSIS — G4733 Obstructive sleep apnea (adult) (pediatric): Secondary | ICD-10-CM | POA: Diagnosis not present

## 2017-11-09 NOTE — Progress Notes (Signed)
Daily Session Note  Patient Details  Name: [name] [name] MRN: [id number] Date of Birth: December 14, 1940 Referring Provider:     PULMONARY REHAB OTHER RESPIRATORY from 10/01/2017 in [place]  Referring Provider  Dr. Annamaria Boots      Encounter Date: 11/09/2017  Check In: Session Check In - 11/09/17 1330      Check-In   Supervising physician immediately available to respond to emergencies  See telemetry face sheet for immediately available MD    Location  [facility]    Staff Present  Russella Dar, MS, EP, [facility], Exercise Physiologist;Other    Medication changes reported      No    Fall or balance concerns reported     No    Warm-up and Cool-down  Performed as group-led instruction    Resistance Training Performed  Yes    VAD Patient?  No    PAD/SET Patient?  No      Pain Assessment   Currently in Pain?  No/denies    Pain Score  0-No pain    Multiple Pain Sites  No       Capillary Blood Glucose: No results found for this or any previous visit (from the past 24 hour(s)).    Social History   Tobacco Use  Smoking Status Never Smoker  Smokeless Tobacco Never Used    Goals Met:  Proper associated with RPD/PD & O2 Sat Independence with exercise equipment Improved SOB with ADL's Using PLB without cueing & demonstrates good technique Exercise tolerated well Personal goals reviewed No report of cardiac concerns or symptoms Strength training completed today  Goals Unmet:  Not Applicable  Comments: Pt able to follow exercise prescription today without complaint.  Will continue to monitor for progression. Check out: 2:30   Dr. Sinda Du is Medical Director for [facility] Pul[facility].

## 2017-11-11 ENCOUNTER — Encounter (HOSPITAL_COMMUNITY)
Admission: RE | Admit: 2017-11-11 | Discharge: 2017-11-11 | Disposition: A | Discharge status: Home / Self Care | Payer: Medicare Other | Source: Ambulatory Visit | Attending: Internal Medicine | Admitting: Internal Medicine

## 2017-11-11 ENCOUNTER — Telehealth (HOSPITAL_COMMUNITY): Payer: Self-pay | Admitting: *Deleted

## 2017-11-11 DIAGNOSIS — F419 Anxiety disorder, unspecified: Secondary | ICD-10-CM | POA: Diagnosis not present

## 2017-11-11 DIAGNOSIS — F329 Major depressive disorder, single episode, unspecified: Secondary | ICD-10-CM | POA: Diagnosis not present

## 2017-11-11 DIAGNOSIS — G4733 Obstructive sleep apnea (adult) (pediatric): Secondary | ICD-10-CM | POA: Diagnosis not present

## 2017-11-11 DIAGNOSIS — I4892 Unspecified atrial flutter: Secondary | ICD-10-CM | POA: Diagnosis not present

## 2017-11-11 DIAGNOSIS — I480 Paroxysmal atrial fibrillation: Secondary | ICD-10-CM | POA: Diagnosis not present

## 2017-11-11 DIAGNOSIS — J4550 Severe persistent asthma, uncomplicated: Secondary | ICD-10-CM | POA: Diagnosis not present

## 2017-11-11 NOTE — Progress Notes (Signed)
Daily Session Note  Patient Details  Name: [name] [name] MRN: [id number] Date of Birth: July 01, 1940 Referring Provider:     PULMONARY REHAB OTHER RESPIRATORY from 10/01/2017 in [place]  Referring Provider  Dr. Annamaria Boots      Encounter Date: 11/11/2017  Check In: Session Check In - 11/11/17 1330      Check-In   Supervising physician immediately available to respond to emergencies  See telemetry face sheet for immediately available MD    Location  [facility]    Staff Present  Russella Dar, MS, EP, [facility], Exercise Physiologist;Clee Pandit Wynetta Emery, RN, BSN    Medication changes reported      No    Fall or balance concerns reported     No    Warm-up and Cool-down  Performed as group-led instruction    Resistance Training Performed  Yes    VAD Patient?  No    PAD/SET Patient?  No      Pain Assessment   Currently in Pain?  No/denies    Pain Score  0-No pain    Multiple Pain Sites  No       Capillary Blood Glucose: No results found for this or any previous visit (from the past 24 hour(s)).    Social History   Tobacco Use  Smoking Status Never Smoker  Smokeless Tobacco Never Used    Goals Met:  Proper associated with RPD/PD & O2 Sat Independence with exercise equipment Improved SOB with ADL's Using PLB without cueing & demonstrates good technique Exercise tolerated well No report of cardiac concerns or symptoms Strength training completed today  Goals Unmet:  Not Applicable  Comments: Check out 1430.   Dr. Sinda Du is Medical Director for [facility].

## 2017-11-11 NOTE — Telephone Encounter (Signed)
Patient called in stating she has been in AF since Tuesday. Shes been having intermittent breakthrough although it does not last long but this has been persistent since Tuesday. She is having issues with thrush and is on her second round of treatment for this. Her HR is not fast like in the past with AF so she is tolerating it better. Appt made for next week to be assessed.

## 2017-11-12 ENCOUNTER — Other Ambulatory Visit: Payer: Self-pay | Admitting: Internal Medicine

## 2017-11-12 MED ORDER — FLUTICASONE-UMECLIDIN-VILANT 100-62.5-25 MCG/INH IN AEPB: 1.0000 | INHALATION_SPRAY | Freq: Every day | RESPIRATORY_TRACT | 3 refills | Status: DC

## 2017-11-15 ENCOUNTER — Telehealth: Payer: Self-pay | Admitting: Internal Medicine

## 2017-11-15 MED ORDER — UMECLIDINIUM-VILANTEROL 62.5-25 MCG/INH IN AEPB: 1.0000 | INHALATION_SPRAY | Freq: Every day | RESPIRATORY_TRACT | 0 refills | Status: DC

## 2017-11-15 MED ORDER — UMECLIDINIUM-VILANTEROL 62.5-25 MCG/INH IN AEPB: 1.0000 | INHALATION_SPRAY | Freq: Every day | RESPIRATORY_TRACT | 3 refills | Status: DC

## 2017-11-15 NOTE — Telephone Encounter (Signed)
lmtcb for pt.  

## 2017-11-15 NOTE — Telephone Encounter (Signed)
Recommend change to Anoro. # 1, inhale 1 puff, once daily. R x 5. Use this instead of Trelegy. It is like Trelegy, except without the steroid component that causes thrush. See if this does as well now for breathing.

## 2017-11-15 NOTE — Telephone Encounter (Signed)
Per CY: Would recommend to swish and spit with regular mouthwash for a few days  Called spoke with patient, she is aware of above recommendations Nothing further needed; will sign off

## 2017-11-15 NOTE — Telephone Encounter (Signed)
Patient returned phone call; pt contact # [phone]

## 2017-11-15 NOTE — Telephone Encounter (Signed)
[place]- script signed

## 2017-11-15 NOTE — Telephone Encounter (Signed)
Called spoke with patient and discussed CY's recommendations to change to Anoro Pt voiced her understanding Patient assistance has already been started for the Trelegy - pt would like this process switched to Anoro Pt requesting Rx for Anoro to [facility] >> done Patient assistance discussed with Joellen [place] >> new 90 day Rx printed and placed on CY's cart with this message  In the meantime, patient stated that she took a script for MMW from PCP and the thrush does feel better but her lips still "feel funny" with some residual peeling inside her mouth and soreness on tongue.  Patient is asking if she needs another Rx for MMW or if taking a probiotic would help.  Dr Annamaria Boots please advise, thank you.

## 2017-11-15 NOTE — Telephone Encounter (Signed)
Pt is concerned that Trelegy is causing her continual oral thrush.  Pt has been rinsing mouth after inhaler use, and using swish and swallow (2 rounds of it) to help, but s/s still persist- soreness on tongue, white patches on throat and tongue, skin peeling in mouth.  Pt requesting additional recs.    CY please advise.  Thanks!

## 2017-11-16 ENCOUNTER — Encounter (HOSPITAL_COMMUNITY)
Admission: RE | Admit: 2017-11-16 | Discharge: 2017-11-16 | Disposition: A | Discharge status: Home / Self Care | Payer: Medicare Other | Source: Ambulatory Visit | Attending: Internal Medicine | Admitting: Internal Medicine

## 2017-11-16 DIAGNOSIS — I480 Paroxysmal atrial fibrillation: Secondary | ICD-10-CM | POA: Diagnosis not present

## 2017-11-16 DIAGNOSIS — I4892 Unspecified atrial flutter: Secondary | ICD-10-CM | POA: Diagnosis not present

## 2017-11-16 DIAGNOSIS — F419 Anxiety disorder, unspecified: Secondary | ICD-10-CM | POA: Diagnosis not present

## 2017-11-16 DIAGNOSIS — J4550 Severe persistent asthma, uncomplicated: Secondary | ICD-10-CM

## 2017-11-16 DIAGNOSIS — G4733 Obstructive sleep apnea (adult) (pediatric): Secondary | ICD-10-CM | POA: Diagnosis not present

## 2017-11-16 DIAGNOSIS — F329 Major depressive disorder, single episode, unspecified: Secondary | ICD-10-CM | POA: Diagnosis not present

## 2017-11-16 NOTE — Progress Notes (Signed)
Daily Session Note  Patient Details  Name: [name] [name] MRN: [id number] Date of Birth: September 27, 1940 Referring Provider:     PULMONARY REHAB OTHER RESPIRATORY from 10/01/2017 in [place]  Referring Provider  Dr. Annamaria Boots      Encounter Date: 11/16/2017  Check In: Session Check In - 11/16/17 1330      Check-In   Supervising physician immediately available to respond to emergencies  See telemetry face sheet for immediately available MD    Location  [facility]    Staff Present  Russella Dar, MS, EP, [facility], Exercise Physiologist;Other    Medication changes reported      No    Fall or balance concerns reported     No    Warm-up and Cool-down  Performed as group-led instruction    Resistance Training Performed  Yes    VAD Patient?  No    PAD/SET Patient?  No      Pain Assessment   Currently in Pain?  No/denies    Pain Score  0-No pain    Multiple Pain Sites  No       Capillary Blood Glucose: No results found for this or any previous visit (from the past 24 hour(s)).    Social History   Tobacco Use  Smoking Status Never Smoker  Smokeless Tobacco Never Used    Goals Met:  Proper associated with RPD/PD & O2 Sat Independence with exercise equipment Improved SOB with ADL's Exercise tolerated well Personal goals reviewed No report of cardiac concerns or symptoms Strength training completed today  Goals Unmet:  Not Applicable  Comments: Pt able to follow exercise prescription today without complaint.  Will continue to monitor for progression. Check out: 2:30   Dr. Sinda Du is Medical Director for [facility] Pulmonary Re[facility].

## 2017-11-17 ENCOUNTER — Ambulatory Visit (HOSPITAL_COMMUNITY)
Admission: RE | Admit: 2017-11-17 | Discharge: 2017-11-17 | Disposition: A | Discharge status: Home / Self Care | Payer: Medicare Other | Source: Ambulatory Visit | Attending: Nurse Practitioner | Admitting: Nurse Practitioner

## 2017-11-17 ENCOUNTER — Encounter (HOSPITAL_COMMUNITY): Payer: Self-pay | Admitting: Nurse Practitioner

## 2017-11-17 VITALS — BP 120/60 | HR 65 | Ht 63.0 in | Wt 200.2 lb

## 2017-11-17 DIAGNOSIS — Z79899 Other long term (current) drug therapy: Secondary | ICD-10-CM | POA: Insufficient documentation

## 2017-11-17 DIAGNOSIS — F419 Anxiety disorder, unspecified: Secondary | ICD-10-CM | POA: Diagnosis not present

## 2017-11-17 DIAGNOSIS — E669 Obesity, unspecified: Secondary | ICD-10-CM | POA: Insufficient documentation

## 2017-11-17 DIAGNOSIS — Z7901 Long term (current) use of anticoagulants: Secondary | ICD-10-CM | POA: Diagnosis not present

## 2017-11-17 DIAGNOSIS — Z6835 Body mass index (BMI) 35.0-35.9, adult: Secondary | ICD-10-CM | POA: Diagnosis not present

## 2017-11-17 DIAGNOSIS — E785 Hyperlipidemia, unspecified: Secondary | ICD-10-CM | POA: Diagnosis not present

## 2017-11-17 DIAGNOSIS — I4892 Unspecified atrial flutter: Secondary | ICD-10-CM | POA: Insufficient documentation

## 2017-11-17 DIAGNOSIS — F329 Major depressive disorder, single episode, unspecified: Secondary | ICD-10-CM | POA: Diagnosis not present

## 2017-11-17 DIAGNOSIS — Z881 Allergy status to other antibiotic agents status: Secondary | ICD-10-CM | POA: Diagnosis not present

## 2017-11-17 DIAGNOSIS — J449 Chronic obstructive pulmonary disease, unspecified: Secondary | ICD-10-CM | POA: Insufficient documentation

## 2017-11-17 DIAGNOSIS — G4733 Obstructive sleep apnea (adult) (pediatric): Secondary | ICD-10-CM | POA: Diagnosis not present

## 2017-11-17 DIAGNOSIS — Z880 Allergy status to penicillin: Secondary | ICD-10-CM | POA: Insufficient documentation

## 2017-11-17 DIAGNOSIS — I481 Persistent atrial fibrillation: Secondary | ICD-10-CM | POA: Diagnosis not present

## 2017-11-17 DIAGNOSIS — I4819 Other persistent atrial fibrillation: Secondary | ICD-10-CM

## 2017-11-17 DIAGNOSIS — I10 Essential (primary) hypertension: Secondary | ICD-10-CM | POA: Diagnosis not present

## 2017-11-17 DIAGNOSIS — Z888 Allergy status to other drugs, medicaments and biological substances status: Secondary | ICD-10-CM | POA: Diagnosis not present

## 2017-11-17 NOTE — Progress Notes (Signed)
Primary Care Physician: Monico Blitz, MD Referring Physician: Dr. [name] is a 77 y.o. female with a h/o COPD, HTN, OSA, atrial flutter and paroxysmal afib tht has been persistent for several weeks. She was seen  the afib clinic for admission for Tikosyn. She failed flecainide, many cardioversion's in the past.  She is no longer on HCTZ.  F/u in afib clinic one week after Tikosyn loading. She is Sinus brady today at HR of 49 bpm, but at home HR ins in the 50's  Which is her norm. She feels improved on tikosyn being  in rhythm.  F/u in afib clinic 8/21. She had a few off days lat week and wondered if she was in afib. The only change in her health is that she started Trilogy inhaler for her asthma and had to be treated for thrush, She has now stopped inhaler. She is in rhythm today. She has started Pulmonology rehab and is pleased that she is having more energy and is able to do activities that she was able to do before.  Today, she denies symptoms of palpitations, chest pain, shortness of breath, orthopnea, PND, lower extremity edema, dizziness, presyncope, syncope, or neurologic sequela. The patient is tolerating medications without difficulties and is otherwise without complaint today.   Past Medical History:  Diagnosis Date  . Allergic rhinitis   . Anxiety   . Asthma   . Atrial flutter ([place])   . COPD (chronic obstructive pulmonary disease) ([place])   . Depression   . Essential hypertension   . Hyperlipidemia   . Lumbar disc disease   . Obesity   . OSA (obstructive sleep apnea)    CPAP  . Paroxysmal atrial fibrillation ([facility])    Element of tachycardia bradycardia syndrome  . Respiratory failure [facility])    Past Surgical History:  Procedure Laterality Date  . ANTERIOR FUSION LUMBAR SPINE    . BIOPSY  08/27/2016   Procedure: BIOPSY;  Surgeon: Rogene Houston, MD;  Location: [facility];  Service: Endoscopy;;  FOUR GASTRIC POLYPS BIOPSIED  . CARDIOVERSION N/A  03/28/2014   Procedure: CARDIOVERSION;  Surgeon: Fay Records, MD;  Location: [facility] ORS;  Service: Cardiovascular;  Laterality: N/A;  . CARDIOVERSION N/A 10/22/2016   Procedure: CARDIOVERSION;  Surgeon: Sueanne Margarita, MD;  Location: [facility] ENDOSCOPY;  Service: Cardiovascular;  Laterality: N/A;  . CATARACT EXTRACTION    . COLONOSCOPY N/A 08/04/2012   Procedure: COLONOSCOPY;  Surgeon: Rogene Houston, MD;  Location: [facility];  Service: Endoscopy;  Laterality: N/A;  730-rescheduled to [place] notified pt  . ELECTROPHYSIOLOGIC STUDY N/A 09/18/2014   Procedure: Atrial Fibrillation Ablation;  Surgeon: Thompson Grayer, MD;  Location: [place] CV LAB;  Service: Cardiovascular;  Laterality: N/A;  . ESOPHAGOGASTRODUODENOSCOPY N/A 08/27/2016   Procedure: ESOPHAGOGASTRODUODENOSCOPY (EGD);  Surgeon: Rogene Houston, MD;  Location: [facility];  Service: Endoscopy;  Laterality: N/A;  1200  . LASIK     Both eyes  . TEE WITHOUT CARDIOVERSION N/A 09/18/2014   Procedure: TRANSESOPHAGEAL ECHOCARDIOGRAM (TEE);  Surgeon: Larey Dresser, MD;  Location: [place];  Service: Cardiovascular;  Laterality: N/A;  . TEE WITHOUT CARDIOVERSION N/A 10/22/2016   Procedure: TRANSESOPHAGEAL ECHOCARDIOGRAM (TEE);  Surgeon: Sueanne Margarita, MD;  Location: [facility] ENDOSCOPY;  Service: Cardiovascular;  Laterality: N/A;  . TEE WITHOUT CARDIOVERSION N/A 08/24/2017   Procedure: TRANSESOPHAGEAL ECHOCARDIOGRAM (TEE);  Surgeon: Josue Hector, MD;  Location: [place];  Service: Cardiovascular;  Laterality:  N/A;  . TOTAL HIP ARTHROPLASTY  2010   Right    Current Outpatient Medications  Medication Sig Dispense Refill  . acetaminophen (TYLENOL) 500 MG tablet Take 500 mg by mouth daily as needed for headache.    Marland Kitchen antiseptic oral rinse (BIOTENE) LIQD 15 mLs by Mouth Rinse route as needed for dry mouth.    Marland Kitchen atorvastatin (LIPITOR) 10 MG tablet Take 10 mg by mouth every evening.     . baclofen (LIORESAL) 10 MG tablet Take 10 mg by mouth  as needed for muscle spasms.    . benzonatate (TESSALON) 200 MG capsule Take 1 capsule (200 mg total) by mouth 3 (three) times daily as needed for cough. 30 capsule 3  . Biotin 5000 MCG CAPS Take 5,000 mcg by mouth daily.    . budesonide (PULMICORT) 0.5 MG/2ML nebulizer solution Take 0.5 mg by nebulization 2 (two) times daily.     . cetirizine (ZYRTEC) 10 MG tablet Take 10 mg by mouth daily as needed for allergies.    . clindamycin (CLEOCIN) 300 MG capsule 2 capsules as needed. Take prior to dental procedures  0  . cycloSPORINE (RESTASIS) 0.05 % ophthalmic emulsion Place 1 drop 2 (two) times daily into both eyes.     Marland Kitchen diltiazem (CARTIA XT) 120 MG 24 hr capsule Take 120 mg by mouth at bedtime.     . dofetilide (TIKOSYN) 500 MCG capsule Take 1 capsule (500 mcg total) by mouth 2 (two) times daily. 60 capsule 6  . famotidine (PEPCID) 20 MG tablet Take 1 tablet (20 mg total) by mouth at bedtime. 30 tablet 2  . fluticasone (FLONASE) 50 MCG/ACT nasal spray Place 1 spray into both nostrils 2 (two) times daily.    Marland Kitchen HYDROcodone-homatropine (HYDROMET) 5-1.5 MG/5ML syrup Take 5 mLs by mouth every 6 (six) hours as needed for cough. 120 mL 0  . Hypromellose (ARTIFICIAL TEARS OP) Apply 1 drop to eye every 4 (four) hours as needed (dry eyes).     Marland Kitchen ipratropium (ATROVENT) 0.06 % nasal spray Place 2 sprays into both nostrils 4 (four) times daily as needed for rhinitis.     Marland Kitchen ipratropium-albuterol (DUONEB) 0.5-2.5 (3) MG/3ML SOLN Take 3 mLs by nebulization 2 (two) times daily.     Marland Kitchen losartan (COZAAR) 100 MG tablet Take 1 tablet (100 mg total) by mouth daily. 90 tablet 2  . montelukast (SINGULAIR) 10 MG tablet Take 10 mg by mouth at bedtime.    Marland Kitchen nystatin (MYCOSTATIN) 100000 UNIT/ML suspension Take 5 mLs (500,000 Units total) by mouth 4 (four) times daily. 60 mL 0  . omeprazole (PRILOSEC) 20 MG capsule Take 20 mg by mouth daily.    . potassium chloride SA (K-DUR,KLOR-CON) 20 MEQ tablet Take 1 tablet (20 mEq total)  by mouth daily. 30 tablet 6  . sodium chloride (OCEAN) 0.65 % SOLN nasal spray Place 1 spray into both nostrils 2 (two) times daily.     Marland Kitchen triamcinolone ointment (KENALOG) 0.1 % Apply 1 application topically daily as needed for rash.  2  . umeclidinium-vilanterol (ANORO ELLIPTA) 62.5-25 MCG/INH AEPB Inhale 1 puff into the lungs daily. 3 each 3  . WARFARIN SODIUM PO Take by mouth as directed.    . Wheat Dextrin (BENEFIBER ON THE GO) POWD Take 1 Dose by mouth daily as needed (constipation).      Current Facility-Administered Medications  Medication Dose Route Frequency Provider Last Rate Last Dose  . Mepolizumab SOLR 100 mg  100 mg Subcutaneous Q28  days Baird Lyons D, MD   100 mg at 08/20/17 0850  . Mepolizumab SOLR 100 mg  100 mg Subcutaneous Q28 days Baird Lyons D, MD   100 mg at 11/03/17 1037    Allergies  Allergen Reactions  . Bupropion Hcl Other (See Comments)    Suicidal Thoughts.   . Escitalopram Oxalate Other (See Comments)    Suicidal Thoughts.   . Fluticasone-Salmeterol Other (See Comments)    Advair - Caused patient to go into Afib.   Marland Kitchen Lisinopril Cough  . Serevent Other (See Comments)    Caused patient to go into Afib  . Macrodantin [Nitrofurantoin] Rash  . Penicillins Rash and Other (See Comments)    Childhood allergy  Has patient had a PCN reaction causing immediate rash, facial/tongue/throat swelling, SOB or lightheadedness with hypotension: Unknown Has patient had a PCN reaction causing severe rash involving mucus membranes or skin necrosis: Unknown Has patient had a PCN reaction that required hospitalization No Has patient had a PCN reaction occurring within the last 10 years: No If all of the above answers are "NO", then may proceed with Cephalosporin use.     Social History   Socioeconomic History  . Marital status: Single    Spouse name: Not on file  . Number of children: Not on file  . Years of education: Not on file  . Highest education level: Not  on file  Occupational History  . Occupation: Retired: Presenter, broadcasting  Social Needs  . Financial resource strain: Not on file  . Food insecurity:    Worry: Not on file    Inability: Not on file  . Transportation needs:    Medical: Not on file    Non-medical: Not on file  Tobacco Use  . Smoking status: Never Smoker  . Smokeless tobacco: Never Used  Substance and Sexual Activity  . Alcohol use: No    Alcohol/week: 0.0 standard drinks  . Drug use: No  . Sexual activity: Not on file  Lifestyle  . Physical activity:    Days per week: Not on file    Minutes per session: Not on file  . Stress: Not on file  Relationships  . Social connections:    Talks on phone: Not on file    Gets together: Not on file    Attends religious service: Not on file    Active member of club or organization: Not on file    Attends meetings of clubs or organizations: Not on file    Relationship status: Not on file  . Intimate partner violence:    Fear of current or ex partner: Not on file    Emotionally abused: Not on file    Physically abused: Not on file    Forced sexual activity: Not on file  Other Topics Concern  . Not on file  Social History Narrative   Pt lives in [place] alone. She was never married.   Retired Customer service manager.   Attends [employer]    Family History  Problem Relation Age of Onset  . Cancer Mother   . Heart attack Father   . Colon cancer Other     ROS- All systems are reviewed and negative except as per the HPI above  Physical Exam: Vitals:   11/17/17 0836  BP: 120/60  Pulse: 65  Weight: 90.8 kg  Height: 5\' 3"  (1.6 m)   Wt Readings from Last 3 Encounters:  11/17/17 90.8 kg  10/28/17 90.5 kg  10/04/17  92.4 kg    Labs: Lab Results  Component Value Date   NA 141 09/20/2017   K 3.6 09/20/2017   CL 105 09/20/2017   CO2 25 09/20/2017   GLUCOSE 98 09/20/2017   BUN 19 09/20/2017   CREATININE 0.91 09/20/2017   CALCIUM 9.3 09/20/2017   MG 1.8  09/01/2017   Lab Results  Component Value Date   INR 2.59 08/27/2017   No results found for: CHOL, HDL, LDLCALC, TRIG   GEN- The patient is well appearing, alert and oriented x 3 today.   Head- normocephalic, atraumatic Eyes-  Sclera clear, conjunctiva pink Ears- hearing intact Oropharynx- clear Neck- supple, no JVP Lymph- no cervical lymphadenopathy Lungs- Clear to ausculation bilaterally, normal work of breathing Heart- regular rate and rhythm, no murmurs, rubs or gallops, PMI not laterally displaced GI- soft, NT, ND, + BS Extremities- no clubbing, cyanosis, or edema MS- no significant deformity or atrophy Skin- no rash or lesion Psych- euthymic mood, full affect Neuro- strength and sensation are intact  EKG-  Sinus rhythm with PAC's, possibly junctional beats as well, rate 65 bpm, Qtc 478 ms      Assessment and Plan: 1. Persistent  afib Has failed flecainide Doing well  with Tikosyn  Continue Tikosyn at 500 mcg bid ( qt stable) Is on  drug assistance  with tikosyn Continue warfarin for chadsvasc score of 4 Continue Cartia XT  F/u with Dr. Domenic Polite in September, she will be due bmet/mag/ EKG for Tikosyn surveillance at that time   Butch Penny C. Lester Crickenberger, [facility] [address] [place], [place] [address] [phone]

## 2017-11-18 ENCOUNTER — Encounter (HOSPITAL_COMMUNITY)
Admission: RE | Admit: 2017-11-18 | Discharge: 2017-11-18 | Disposition: A | Discharge status: Home / Self Care | Payer: Medicare Other | Source: Ambulatory Visit | Attending: Internal Medicine | Admitting: Internal Medicine

## 2017-11-18 DIAGNOSIS — F419 Anxiety disorder, unspecified: Secondary | ICD-10-CM | POA: Diagnosis not present

## 2017-11-18 DIAGNOSIS — G4733 Obstructive sleep apnea (adult) (pediatric): Secondary | ICD-10-CM | POA: Diagnosis not present

## 2017-11-18 DIAGNOSIS — I4892 Unspecified atrial flutter: Secondary | ICD-10-CM | POA: Diagnosis not present

## 2017-11-18 DIAGNOSIS — J4550 Severe persistent asthma, uncomplicated: Secondary | ICD-10-CM

## 2017-11-18 DIAGNOSIS — F329 Major depressive disorder, single episode, unspecified: Secondary | ICD-10-CM | POA: Diagnosis not present

## 2017-11-18 DIAGNOSIS — I480 Paroxysmal atrial fibrillation: Secondary | ICD-10-CM | POA: Diagnosis not present

## 2017-11-18 NOTE — Progress Notes (Signed)
Daily Session Note  Patient Details  Name: MOIRA UMHOLTZ MRN: [id number] Date of Birth: Nov 15, 1940 Referring Provider:     PULMONARY REHAB OTHER RESPIRATORY from 10/01/2017 in [place]  Referring Provider  Dr. Annamaria Boots      Encounter Date: 11/18/2017  Check In: Session Check In - 11/18/17 1330      Check-In   Supervising physician immediately available to respond to emergencies  See telemetry face sheet for immediately available MD    Location  [facility]    Staff Present  Russella Dar, MS, EP, [facility], Exercise Physiologist;Other;Isaul Landi Wynetta Emery, RN, BSN    Medication changes reported      No    Fall or balance concerns reported     No    Warm-up and Cool-down  Performed as group-led instruction    Resistance Training Performed  Yes    VAD Patient?  No    PAD/SET Patient?  No      Pain Assessment   Currently in Pain?  No/denies    Pain Score  0-No pain    Multiple Pain Sites  No       Capillary Blood Glucose: No results found for this or any previous visit (from the past 24 hour(s)).    Social History   Tobacco Use  Smoking Status Never Smoker  Smokeless Tobacco Never Used    Goals Met:  Proper associated with RPD/PD & O2 Sat Independence with exercise equipment Improved SOB with ADL's Using PLB without cueing & demonstrates good technique Exercise tolerated well No report of cardiac concerns or symptoms Strength training completed today  Goals Unmet:  Not Applicable  Comments: Pt able to follow exercise prescription today without complaint.  Will continue to monitor for progression. Check out 1430.   Dr. Sinda Du is Medical Director for [facility] Pulmonary Re[facility].

## 2017-11-22 DIAGNOSIS — M9901 Segmental and somatic dysfunction of cervical region: Secondary | ICD-10-CM | POA: Diagnosis not present

## 2017-11-22 DIAGNOSIS — M545 Low back pain: Secondary | ICD-10-CM | POA: Diagnosis not present

## 2017-11-22 DIAGNOSIS — M9905 Segmental and somatic dysfunction of pelvic region: Secondary | ICD-10-CM | POA: Diagnosis not present

## 2017-11-22 DIAGNOSIS — M9903 Segmental and somatic dysfunction of lumbar region: Secondary | ICD-10-CM | POA: Diagnosis not present

## 2017-11-22 DIAGNOSIS — M542 Cervicalgia: Secondary | ICD-10-CM | POA: Diagnosis not present

## 2017-11-22 DIAGNOSIS — M9902 Segmental and somatic dysfunction of thoracic region: Secondary | ICD-10-CM | POA: Diagnosis not present

## 2017-11-22 DIAGNOSIS — M546 Pain in thoracic spine: Secondary | ICD-10-CM | POA: Diagnosis not present

## 2017-11-23 ENCOUNTER — Telehealth: Payer: Self-pay | Admitting: Internal Medicine

## 2017-11-23 ENCOUNTER — Ambulatory Visit: Payer: Medicare Other | Admitting: Internal Medicine

## 2017-11-23 ENCOUNTER — Encounter (HOSPITAL_COMMUNITY): Payer: Medicare Other

## 2017-11-23 DIAGNOSIS — M9903 Segmental and somatic dysfunction of lumbar region: Secondary | ICD-10-CM | POA: Diagnosis not present

## 2017-11-23 DIAGNOSIS — M545 Low back pain: Secondary | ICD-10-CM | POA: Diagnosis not present

## 2017-11-23 DIAGNOSIS — M546 Pain in thoracic spine: Secondary | ICD-10-CM | POA: Diagnosis not present

## 2017-11-23 DIAGNOSIS — M9902 Segmental and somatic dysfunction of thoracic region: Secondary | ICD-10-CM | POA: Diagnosis not present

## 2017-11-23 DIAGNOSIS — M9905 Segmental and somatic dysfunction of pelvic region: Secondary | ICD-10-CM | POA: Diagnosis not present

## 2017-11-23 DIAGNOSIS — M542 Cervicalgia: Secondary | ICD-10-CM | POA: Diagnosis not present

## 2017-11-23 DIAGNOSIS — M9901 Segmental and somatic dysfunction of cervical region: Secondary | ICD-10-CM | POA: Diagnosis not present

## 2017-11-23 NOTE — Telephone Encounter (Signed)
Called and spoke with patient she stated that she sent her part of the paperwork in to the company for patient assistance but she was going to bring [place] a copy of everything incase they need it resent. Patient states that she will bring it by this week. Nothing further needed.

## 2017-11-24 NOTE — Progress Notes (Signed)
Pulmonary Individual Treatment Plan  Patient Details  Name: [name] [name] MRN: [id number] Date of Birth: [birth year] Referring Provider:     PULMONARY REHAB OTHER RESPIRATORY from 10/01/2017 in [place]  Referring Provider  Dr. Annamaria Boots      Initial Encounter Date:    PULMONARY REHAB OTHER RESPIRATORY from 10/01/2017 in [place]  Date  10/01/17      Visit Diagnosis: Asthma in adult, severe persistent, uncomplicated  Patient's Home Medications on Admission:   Current Outpatient Medications:  .  acetaminophen (TYLENOL) 500 MG tablet, Take 500 mg by mouth daily as needed for headache., Disp: , Rfl:  .  antiseptic oral rinse (BIOTENE) LIQD, 15 mLs by Mouth Rinse route as needed for dry mouth., Disp: , Rfl:  .  atorvastatin (LIPITOR) 10 MG tablet, Take 10 mg by mouth every evening. , Disp: , Rfl:  .  baclofen (LIORESAL) 10 MG tablet, Take 10 mg by mouth as needed for muscle spasms., Disp: , Rfl:  .  benzonatate (TESSALON) 200 MG capsule, Take 1 capsule (200 mg total) by mouth 3 (three) times daily as needed for cough., Disp: 30 capsule, Rfl: 3 .  Biotin 5000 MCG CAPS, Take 5,000 mcg by mouth daily., Disp: , Rfl:  .  budesonide (PULMICORT) 0.5 MG/2ML nebulizer solution, Take 0.5 mg by nebulization 2 (two) times daily. , Disp: , Rfl:  .  cetirizine (ZYRTEC) 10 MG tablet, Take 10 mg by mouth daily as needed for allergies., Disp: , Rfl:  .  clindamycin (CLEOCIN) 300 MG capsule, 2 capsules as needed. Take prior to dental procedures, Disp: , Rfl: 0 .  cycloSPORINE (RESTASIS) 0.05 % ophthalmic emulsion, Place 1 drop 2 (two) times daily into both eyes. , Disp: , Rfl:  .  diltiazem (CARTIA XT) 120 MG 24 hr capsule, Take 120 mg by mouth at bedtime. , Disp: , Rfl:  .  dofetilide (TIKOSYN) 500 MCG capsule, Take 1 capsule (500 mcg total) by mouth 2 (two) times daily., Disp: 60 capsule, Rfl: 6 .  famotidine (PEPCID) 20 MG tablet, Take 1 tablet (20 mg  total) by mouth at bedtime., Disp: 30 tablet, Rfl: 2 .  fluticasone (FLONASE) 50 MCG/ACT nasal spray, Place 1 spray into both nostrils 2 (two) times daily., Disp: , Rfl:  .  HYDROcodone-homatropine (HYDROMET) 5-1.5 MG/5ML syrup, Take 5 mLs by mouth every 6 (six) hours as needed for cough., Disp: 120 mL, Rfl: 0 .  Hypromellose (ARTIFICIAL TEARS OP), Apply 1 drop to eye every 4 (four) hours as needed (dry eyes). , Disp: , Rfl:  .  ipratropium (ATROVENT) 0.06 % nasal spray, Place 2 sprays into both nostrils 4 (four) times daily as needed for rhinitis. , Disp: , Rfl:  .  ipratropium-albuterol (DUONEB) 0.5-2.5 (3) MG/3ML SOLN, Take 3 mLs by nebulization 2 (two) times daily. , Disp: , Rfl:  .  losartan (COZAAR) 100 MG tablet, Take 1 tablet (100 mg total) by mouth daily., Disp: 90 tablet, Rfl: 2 .  montelukast (SINGULAIR) 10 MG tablet, Take 10 mg by mouth at bedtime., Disp: , Rfl:  .  nystatin (MYCOSTATIN) 100000 UNIT/ML suspension, Take 5 mLs (500,000 Units total) by mouth 4 (four) times daily., Disp: 60 mL, Rfl: 0 .  omeprazole (PRILOSEC) 20 MG capsule, Take 20 mg by mouth daily., Disp: , Rfl:  .  potassium chloride SA (K-DUR,KLOR-CON) 20 MEQ tablet, Take 1 tablet (20 mEq total) by mouth daily., Disp: 30 tablet, Rfl: 6 .  sodium  chloride (OCEAN) 0.65 % SOLN nasal spray, Place 1 spray into both nostrils 2 (two) times daily. , Disp: , Rfl:  .  triamcinolone ointment (KENALOG) 0.1 %, Apply 1 application topically daily as needed for rash., Disp: , Rfl: 2 .  umeclidinium-vilanterol (ANORO ELLIPTA) 62.5-25 MCG/INH AEPB, Inhale 1 puff into the lungs daily., Disp: 3 each, Rfl: 3 .  WARFARIN SODIUM PO, Take by mouth as directed., Disp: , Rfl:  .  Wheat Dextrin (BENEFIBER ON THE GO) POWD, Take 1 Dose by mouth daily as needed (constipation). , Disp: , Rfl:   Current Facility-Administered Medications:  Marland Kitchen  Mepolizumab SOLR 100 mg, 100 mg, Subcutaneous, Q28 days, Young, Clinton D, MD, 100 mg at 08/20/17 0850 .   Mepolizumab SOLR 100 mg, 100 mg, Subcutaneous, Q28 days, Young, Clinton D, MD, 100 mg at 11/03/17 1037  Past Medical History: Past Medical History:  Diagnosis Date  . Allergic rhinitis   . Anxiety   . Asthma   . Atrial flutter ([place])   . COPD (chronic obstructive pulmonary disease) ([place])   . Depression   . Essential hypertension   . Hyperlipidemia   . Lumbar disc disease   . Obesity   . OSA (obstructive sleep apnea)    CPAP  . Paroxysmal atrial fibrillation ([facility])    Element of tachycardia bradycardia syndrome  . Respiratory failure ([facility])     Tobacco Use: Social History   Tobacco Use  Smoking Status Never Smoker  Smokeless Tobacco Never Used    Labs: Recent Review Flowsheet Data    There is no flowsheet data to display.      Capillary Blood Glucose: No results found for: GLUCAP   Pulmonary Assessment Scores: Pulmonary Assessment Scores    Row Name 10/01/17 1124         ADL UCSD   ADL Phase  Entry     SOB Score total  46     Rest  0     Walk  9     Stairs  4     Bath  2     Dress  2     Shop  2       CAT Score   CAT Score  19       mMRC Score   mMRC Score  3        Pulmonary Function Assessment: Pulmonary Function Assessment - 10/01/17 1123      Pulmonary Function Tests   FVC%  79 %    FEV1%  78 %    FEV1/FVC Ratio  99    RV%  92 %    DLCO%  81 %      Initial Spirometry Results   FVC%  79 %    FEV1%  78 %    FEV1/FVC Ratio  99      Post Bronchodilator Spirometry Results   FVC%  77 %    FEV1%  83 %    FEV1/FVC Ratio  108      Breath   Bilateral Breath Sounds  Clear    Shortness of Breath  Yes       Exercise Target Goals: Exercise Program Goal: Individual exercise prescription set using results from initial 6 min walk test and THRR while considering  patient's activity barriers and safety.   Exercise Prescription Goal: Initial exercise prescription builds to 30-45 minutes a day of aerobic activity, 2-3 days per week.  Home  exercise guidelines will be given to patient during  program as part of exercise prescription that the participant will acknowledge.  Activity Barriers & Risk Stratification: Activity Barriers & Cardiac Risk Stratification - 10/01/17 1007      Activity Barriers & Cardiac Risk Stratification   Activity Barriers  Back Problems;Deconditioning;Shortness of Breath;Left Hip Replacement;Right Hip Replacement;Other (comment)    Comments  Patient great toe and 2nd toes are crossed. has been told by her doctor not to do activities that make her go up on her toes.     Cardiac Risk Stratification  Moderate       6 Minute Walk: 6 Minute Walk    Row Name 10/01/17 1006         6 Minute Walk   Phase  Initial     Distance  900 feet     Distance % Change  0 %     Distance Feet Change  0 ft     Walk Time  6 minutes     # of Rest Breaks  0     MPH  1.7     METS  2.3     RPE  13     Perceived Dyspnea   14     VO2 Peak  6.24     Symptoms  Yes (comment)     Comments  Patient had to push a wheelchair half way through walk test due to extreme shortness of breath     Resting HR  62 bpm     Resting BP  124/52     Resting Oxygen Saturation   97 %     Exercise Oxygen Saturation  during 6 min walk  98 %     Max Ex. HR  97 bpm     Max Ex. BP  200/62     2 Minute Post BP  154/60        Oxygen Initial Assessment: Oxygen Initial Assessment - 10/01/17 1127      Home Oxygen   Home Oxygen Device  None    Sleep Oxygen Prescription  None    Home Exercise Oxygen Prescription  None    Home at Rest Exercise Oxygen Prescription  None    Compliance with Home Oxygen Use  --   N/A     Initial 6 min Walk   Oxygen Used  None      Program Oxygen Prescription   Program Oxygen Prescription  None       Oxygen Re-Evaluation: Oxygen Re-Evaluation    Row Name 11/04/17 0742 11/24/17 1504           Program Oxygen Prescription   Program Oxygen Prescription  None  None        Home Oxygen   Home Oxygen  Device  None  None      Sleep Oxygen Prescription  None  None      Home Exercise Oxygen Prescription  -  None      Home at Rest Exercise Oxygen Prescription  None  None      Compliance with Home Oxygen Use  Yes  Yes        Goals/Expected Outcomes   Short Term Goals  To learn and understand importance of maintaining oxygen saturations>88%;To learn and understand importance of monitoring SPO2 with pulse oximeter and demonstrate accurate use of the pulse oximeter.;To learn and demonstrate proper pursed lip breathing techniques or other breathing techniques.  To learn and understand importance of maintaining oxygen saturations>88%;To learn and understand importance of monitoring SPO2 with  pulse oximeter and demonstrate accurate use of the pulse oximeter.;To learn and demonstrate proper pursed lip breathing techniques or other breathing techniques.      Long  Term Goals  Maintenance of O2 saturations>88%;Verbalizes importance of monitoring SPO2 with pulse oximeter and return demonstration;Exhibits proper breathing techniques, such as pursed lip breathing or other method taught during program session  Maintenance of O2 saturations>88%;Verbalizes importance of monitoring SPO2 with pulse oximeter and return demonstration;Exhibits proper breathing techniques, such as pursed lip breathing or other method taught during program session      Comments  Patient verbalizes understanding of maintaining O2 saturation >88% and is able to use pulse oximeter with return demonstration. She exhibits proper pursed lip breathing techniques during exercise.   Patient verbalizes understanding of maintaining O2 saturation >88% and is able to use pulse oximeter with return demonstration. She exhibits proper pursed lip breathing techniques during exercise.       Goals/Expected Outcomes  Patient will continue to meet her short and long term goals.   Patient will continue to meet her short and long term goals.          Oxygen  Discharge (Final Oxygen Re-Evaluation): Oxygen Re-Evaluation - 11/24/17 1504      Program Oxygen Prescription   Program Oxygen Prescription  None      Home Oxygen   Home Oxygen Device  None    Sleep Oxygen Prescription  None    Home Exercise Oxygen Prescription  None    Home at Rest Exercise Oxygen Prescription  None    Compliance with Home Oxygen Use  Yes      Goals/Expected Outcomes   Short Term Goals  To learn and understand importance of maintaining oxygen saturations>88%;To learn and understand importance of monitoring SPO2 with pulse oximeter and demonstrate accurate use of the pulse oximeter.;To learn and demonstrate proper pursed lip breathing techniques or other breathing techniques.    Long  Term Goals  Maintenance of O2 saturations>88%;Verbalizes importance of monitoring SPO2 with pulse oximeter and return demonstration;Exhibits proper breathing techniques, such as pursed lip breathing or other method taught during program session    Comments  Patient verbalizes understanding of maintaining O2 saturation >88% and is able to use pulse oximeter with return demonstration. She exhibits proper pursed lip breathing techniques during exercise.     Goals/Expected Outcomes  Patient will continue to meet her short and long term goals.        Initial Exercise Prescription: Initial Exercise Prescription - 10/01/17 1000      Date of Initial Exercise RX and Referring Provider   Date  10/01/17    Referring Provider  Dr. Annamaria Boots      NuStep   Level  1    SPM  77    Minutes  15    METs  1.8      Arm Ergometer   Level  1.2    Watts  16    RPM  22    Minutes  20    METs  2.2      Prescription Details   Frequency (times per week)  2    Duration  Progress to 30 minutes of continuous aerobic without signs/symptoms of physical distress      Intensity   THRR 40-80% of Max Heartrate  [phone]    Ratings of Perceived Exertion  11-13    Perceived Dyspnea  0-4      Progression    Progression  Continue progressive overload as per policy  without signs/symptoms or physical distress.      Resistance Training   Training Prescription  Yes    Weight  1    Reps  10-15       Perform Capillary Blood Glucose checks as needed.  Exercise Prescription Changes:  Exercise Prescription Changes    Row Name 10/17/17 1200 11/02/17 0800 11/22/17 0700         Response to Exercise   Blood Pressure (Admit)  144/60  114/60  122/62     Blood Pressure (Exercise)  158/60  148/54  152/70     Blood Pressure (Exit)  146/60  118/60  132/62     Heart Rate (Admit)  64 bpm  61 bpm  58 bpm     Heart Rate (Exercise)  83 bpm  80 bpm  83 bpm     Heart Rate (Exit)  68 bpm  67 bpm  69 bpm     Oxygen Saturation (Admit)  97 %  98 %  96 %     Oxygen Saturation (Exercise)  97 %  96 %  90 %     Oxygen Saturation (Exit)  95 %  96 %  97 %     Rating of Perceived Exertion (Exercise)  11  11  11      Perceived Dyspnea (Exercise)  12  12  11      Duration  Progress to 30 minutes of  aerobic without signs/symptoms of physical distress  Progress to 30 minutes of  aerobic without signs/symptoms of physical distress  Progress to 30 minutes of  aerobic without signs/symptoms of physical distress     Intensity  THRR New 96-111-127  THRR New 93-110-126  THRR New 92-108-125       Progression   Progression  Continue to progress workloads to maintain intensity without signs/symptoms of physical distress.  Continue to progress workloads to maintain intensity without signs/symptoms of physical distress.  Continue to progress workloads to maintain intensity without signs/symptoms of physical distress.     Average METs  2.45  2.55  2.8       Resistance Training   Training Prescription  Yes  Yes  Yes     Weight  1  2  3      Reps  10-15  10-15  10-15     Time  5 Minutes  5 Minutes  5 Minutes       NuStep   Level  1  2  2      SPM  99  100  110     Minutes  15  15  17      METs  2.6  2.7  2.9       Arm Ergometer    Level  1.2  1.3  1.3     Watts  18  19  72     RPM  23  67  70     Minutes  20  20  22      METs  2.3  2.4  2.7       Home Exercise Plan   Plans to continue exercise at  Home (comment)  Home (comment)  Home (comment)     Frequency  Add 3 additional days to program exercise sessions.  Add 3 additional days to program exercise sessions.  Add 3 additional days to program exercise sessions.     Initial Home Exercises Provided  10/18/17  10/18/17  10/18/17        Exercise  Comments:  Exercise Comments    Row Name 11/02/17 0844 11/22/17 0738         Exercise Comments  Patient is breathing better. Able to do more and does feel like the program is helping her achieve her goals.   patient feels like the program is helping her. She has been going to strength training classes at the [facility].          Exercise Goals and Review:  Exercise Goals    Row Name 10/01/17 1009             Exercise Goals   Increase Physical Activity  Yes       Intervention  Develop an individualized exercise prescription for aerobic and resistive training based on initial evaluation findings, risk stratification, comorbidities and participant's personal goals.;Provide advice, education, support and counseling about physical activity/exercise needs.       Expected Outcomes  Short Term: Attend rehab on a regular basis to increase amount of physical activity.       Increase Strength and Stamina  Yes       Intervention  Provide advice, education, support and counseling about physical activity/exercise needs.;Develop an individualized exercise prescription for aerobic and resistive training based on initial evaluation findings, risk stratification, comorbidities and participant's personal goals.       Expected Outcomes  Short Term: Increase workloads from initial exercise prescription for resistance, speed, and METs.       Able to understand and use rate of perceived exertion (RPE) scale  Yes       Intervention  Provide  education and explanation on how to use RPE scale       Expected Outcomes  Short Term: Able to use RPE daily in rehab to express subjective intensity level;Long Term:  Able to use RPE to guide intensity level when exercising independently       Able to understand and use Dyspnea scale  Yes       Intervention  Provide education and explanation on how to use Dyspnea scale       Expected Outcomes  Short Term: Able to use Dyspnea scale daily in rehab to express subjective sense of shortness of breath during exertion;Long Term: Able to use Dyspnea scale to guide intensity level when exercising independently       Knowledge and understanding of Target Heart Rate Range (THRR)  Yes       Intervention  Provide education and explanation of THRR including how the numbers were predicted and where they are located for reference       Expected Outcomes  Short Term: Able to state/look up THRR;Short Term: Able to use daily as guideline for intensity in rehab;Long Term: Able to use THRR to govern intensity when exercising independently       Understanding of Exercise Prescription  Yes       Intervention  Provide education, explanation, and written materials on patient's individual exercise prescription       Expected Outcomes  Short Term: Able to explain program exercise prescription;Long Term: Able to explain home exercise prescription to exercise independently          Exercise Goals Re-Evaluation : Exercise Goals Re-Evaluation    Row Name 10/04/17 1440 11/02/17 0840 11/22/17 0735         Exercise Goal Re-Evaluation   Exercise Goals Review  Increase Physical Activity;Increase Strength and Stamina;Able to understand and use rate of perceived exertion (RPE) scale;Able to understand and use Dyspnea scale;Knowledge and  understanding of Target Heart Rate Range (THRR);Understanding of Exercise Prescription  Increase Strength and Stamina;Increase Physical Activity;Able to understand and use rate of perceived exertion  (RPE) scale;Able to understand and use Dyspnea scale;Understanding of Exercise Prescription;Knowledge and understanding of Target Heart Rate Range (THRR)  Increase Strength and Stamina;Able to understand and use Dyspnea scale;Understanding of Exercise Prescription     Comments  Patient has not yet started PR. Patient will be progressed in time in accordance with her goals.   Patient is progessing at a steady rate. Patient is able to handle increases in weights and equipment workloads.   Patient has said that she has been able to return to the [facility] to attend several fitness classes. she still wants to be able to return to the pool to do water aerobics.      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         Discharge Exercise Prescription (Final Exercise Prescription Changes): Exercise Prescription Changes - 11/22/17 0700      Response to Exercise   Blood Pressure (Admit)  122/62    Blood Pressure (Exercise)  152/70    Blood Pressure (Exit)  132/62    Heart Rate (Admit)  58 bpm    Heart Rate (Exercise)  83 bpm    Heart Rate (Exit)  69 bpm    Oxygen Saturation (Admit)  96 %    Oxygen Saturation (Exercise)  90 %    Oxygen Saturation (Exit)  97 %    Rating of Perceived Exertion (Exercise)  11    Perceived Dyspnea (Exercise)  11    Duration  Progress to 30 minutes of  aerobic without signs/symptoms of physical distress    Intensity  THRR New   92-108-125     Progression   Progression  Continue to progress workloads to maintain intensity without signs/symptoms of physical distress.    Average METs  2.8      Resistance Training   Training Prescription  Yes    Weight  3    Reps  10-15    Time  5 Minutes      NuStep    Level  2    SPM  110    Minutes  17    METs  2.9      Arm Ergometer   Level  1.3    Watts  72    RPM  70    Minutes  22    METs  2.7      Home Exercise Plan   Plans to continue exercise at  Home (comment)    Frequency  Add 3 additional days to program exercise sessions.    Initial Home Exercises Provided  10/18/17       Nutrition:  Target Goals: Understanding of nutrition guidelines, daily intake of sodium 1500mg , cholesterol 200mg , calories 30% from fat and 7% or less from saturated fats, daily to have 5 or more servings of fruits and vegetables.  Biometrics: Pre Biometrics - 10/01/17 1010      Pre Biometrics   Height  5\' 3"  (1.6 m)    Weight  92 kg    Waist Circumference  48  inches    Hip Circumference  39.5 inches    Waist to Hip Ratio  1.22 %    BMI (Calculated)  35.93    Triceps Skinfold  5 mm    % Body Fat  40.2 %    Grip Strength  51.06 kg    Flexibility  0 in    Single Leg Stand  7 seconds        Nutrition Therapy Plan and Nutrition Goals: Nutrition Therapy & Goals - 10/07/17 1503      Personal Nutrition Goals   Nutrition Goal  For heart healthy choices add >50% of whole grains, make half their plate fruits and vegetables. Discuss the difference between starchy vegetables and leafy greens, and how leafy vegetables provide fiber, helps maintain healthy weight, helps control blood glucose, and lowers cholesterol.  Discuss purchasing fresh or frozen vegetable to reduce sodium and not to add grease, fat or sugar. Consume <18oz of red meat per week. Consume lean cuts of meats and very little of meats high in sodium and nitrates such as pork and lunch meats. Discussed portion control for all food groups.        Intervention Plan   Intervention  Nutrition handout(s) given to patient.    Expected Outcomes  Short Term Goal: Understand basic principles of dietary content, such as calories, fat, sodium, cholesterol and nutrients.       Nutrition  Assessments: Nutrition Assessments - 10/01/17 1132      MEDFICTS Scores   Pre Score  54       Nutrition Goals Re-Evaluation: Nutrition Goals Re-Evaluation    Row Name 11/04/17 0752 11/24/17 1505           Goals   Current Weight  202 lb (91.6 kg)  200 lb (90.7 kg)      Nutrition Goal  For heart healthy choices add >50% of whole grains, make half their plate fruits and vegetables. Discuss the difference between starchy vegetables and leafy greens, and how leafy vegetables provide fiber, helps maintain healthy weight, helps control blood glucose, and lowers cholesterol.  Discuss purchasing fresh or frozen vegetable to reduce sodium and not to add grease, fat or sugar. Consume <18oz of red meat per week. Consume lean cuts of meats and very little of meats high in sodium and nitrates such as pork and lunch meats. Discussed portion control for all food groups.    For heart healthy choices add >50% of whole grains, make half their plate fruits and vegetables. Discuss the difference between starchy vegetables and leafy greens, and how leafy vegetables provide fiber, helps maintain healthy weight, helps control blood glucose, and lowers cholesterol.  Discuss purchasing fresh or frozen vegetable to reduce sodium and not to add grease, fat or sugar. Consume <18oz of red meat per week. Consume lean cuts of meats and very little of meats high in sodium and nitrates such as pork and lunch meats. Discussed portion control for all food groups.        Comment  Patient says she is eating heart healthy and is working toward meeting her nutritional goals.   Patient has lost 2 lbs since last 30 day review. She says she is continuing to eat healthy. Will continue to monitor.       Expected Outcome  Patient will continue to work toward meeting her nutritional needes. Will continue to monitor for progress.   Patient will continue to work toward meeting her nutritional needes. Will continue to monitor for  progress.          Personal Goal #2 Re-Evaluation   Personal Goal #2  Patient is working on eating heart healthy. Handout given for her to improve on her food choices.   Patient is working on eating heart healthy. Handout given for her to improve on her food choices.          Nutrition Goals Discharge (Final Nutrition Goals Re-Evaluation): Nutrition Goals Re-Evaluation - 11/24/17 1505      Goals   Current Weight  200 lb (90.7 kg)    Nutrition Goal  For heart healthy choices add >50% of whole grains, make half their plate fruits and vegetables. Discuss the difference between starchy vegetables and leafy greens, and how leafy vegetables provide fiber, helps maintain healthy weight, helps control blood glucose, and lowers cholesterol.  Discuss purchasing fresh or frozen vegetable to reduce sodium and not to add grease, fat or sugar. Consume <18oz of red meat per week. Consume lean cuts of meats and very little of meats high in sodium and nitrates such as pork and lunch meats. Discussed portion control for all food groups.      Comment  Patient has lost 2 lbs since last 30 day review. She says she is continuing to eat healthy. Will continue to monitor.     Expected Outcome  Patient will continue to work toward meeting her nutritional needes. Will continue to monitor for progress.       Personal Goal #2 Re-Evaluation   Personal Goal #2  Patient is working on eating heart healthy. Handout given for her to improve on her food choices.        Psychosocial: Target Goals: Acknowledge presence or absence of significant depression and/or stress, maximize coping skills, provide positive support system. Participant is able to verbalize types and ability to use techniques and skills needed for reducing stress and depression.  Initial Review & Psychosocial Screening: Initial Psych Review & Screening - 10/01/17 1134      Initial Review   Current issues with  None Identified      Family Dynamics   Good Support System?   Yes      Barriers   Psychosocial barriers to participate in program  There are no identifiable barriers or psychosocial needs.      Screening Interventions   Interventions  Encouraged to exercise    Expected Outcomes  Short Term goal: Identification and review with participant of any Quality of Life or Depression concerns found by scoring the questionnaire.;Long Term goal: The participant improves quality of Life and PHQ9 Scores as seen by post scores and/or verbalization of changes       Quality of Life Scores: Quality of Life - 10/01/17 1010      Quality of Life   Select  Quality of Life      Quality of Life Scores   Health/Function Pre  24.4 %    Socioeconomic Pre  26.75 %    Psych/Spiritual Pre  24.86 %    Family Pre  18 %    GLOBAL Pre  24.55 %      Scores of 19 and below usually indicate a poorer quality of life in these areas.  A difference of  2-3 points is a clinically meaningful difference.  A difference of 2-3 points in the total score of the Quality of Life Index has been associated with significant improvement in overall quality of life, self-image, physical symptoms, and general health in studies assessing change  in quality of life.   PHQ-9: Recent Review Flowsheet Data    Depression screen [facility] 2/9 10/01/2017   Decreased Interest 0   Down, Depressed, Hopeless 0   PHQ - 2 Score 0   Altered sleeping 2   Tired, decreased energy 2   Change in appetite 2   Feeling bad or failure about yourself  0   Trouble concentrating 0   Moving slowly or fidgety/restless 0   Suicidal thoughts 0   PHQ-9 Score 6   Difficult doing work/chores Not difficult at all     Interpretation of Total Score  Total Score Depression Severity:  1-4 = Minimal depression, 5-9 = Mild depression, 10-14 = Moderate depression, 15-19 = Moderately severe depression, 20-27 = Severe depression   Psychosocial Evaluation and Intervention: Psychosocial Evaluation - 10/01/17 1135      Psychosocial  Evaluation & Interventions   Interventions  Encouraged to exercise with the program and follow exercise prescription    Continue Psychosocial Services   No Follow up required       Psychosocial Re-Evaluation: Psychosocial Re-Evaluation    [place] Name 11/04/17 0749 11/24/17 1508           Psychosocial Re-Evaluation   Current issues with  None Identified  None Identified      Comments  Patient initial QOL score was 18 and her PHQ-9 score was 6 with no psychosocial issues identified at orientation. Will continue to monitor.  Patient initial QOL score was 18 and her PHQ-9 score was 6 with no psychosocial issues identified at orientation. Will continue to monitor.      Expected Outcomes  Patient will have no psychosocial issues identified at discharge.   Patient will have no psychosocial issues identified at discharge.       Interventions  Relaxation education;Encouraged to attend Pulmonary Rehabilitation for the exercise;Stress management education  Relaxation education;Encouraged to attend Pulmonary Rehabilitation for the exercise;Stress management education      Continue Psychosocial Services   No Follow up required  No Follow up required         Psychosocial Discharge (Final Psychosocial Re-Evaluation): Psychosocial Re-Evaluation - 11/24/17 1508      Psychosocial Re-Evaluation   Current issues with  None Identified    Comments  Patient initial QOL score was 18 and her PHQ-9 score was 6 with no psychosocial issues identified at orientation. Will continue to monitor.    Expected Outcomes  Patient will have no psychosocial issues identified at discharge.     Interventions  Relaxation education;Encouraged to attend Pulmonary Rehabilitation for the exercise;Stress management education    Continue Psychosocial Services   No Follow up required        Education: Education Goals: Education classes will be provided on a weekly basis, covering required topics. Participant will state  understanding/return demonstration of topics presented.  Learning Barriers/Preferences: Learning Barriers/Preferences - 10/01/17 1020      Learning Barriers/Preferences   Learning Barriers  None    Learning Preferences  Skilled Demonstration;Individual Instruction;Group Instruction       Education Topics: How Lungs Work and Diseases: - Discuss the anatomy of the lungs and diseases that can affect the lungs, such as COPD.   PULMONARY REHAB OTHER RESPIRATORY from 11/18/2017 in [place]  Date  11/04/17  Educator  DC  Instruction Review Code  2- Demonstrated Understanding      Exercise: -Discuss the importance of exercise, FITT principles of exercise, normal and abnormal responses to exercise, and how  to exercise safely.   Environmental Irritants: -Discuss types of environmental irritants and how to limit exposure to environmental irritants.   PULMONARY REHAB OTHER RESPIRATORY from 11/18/2017 in [place]  Date  11/11/17  Educator  DWynetta Emery  Instruction Review Code  2- Demonstrated Understanding      Meds/Inhalers and oxygen: - Discuss respiratory medications, definition of an inhaler and oxygen, and the proper way to use an inhaler and oxygen.   PULMONARY REHAB OTHER RESPIRATORY from 11/18/2017 in [place]  Date  11/18/17  Educator  D. Wynetta Emery      Energy Saving Techniques: - Discuss methods to conserve energy and decrease shortness of breath when performing activities of daily living.    Bronchial Hygiene / Breathing Techniques: - Discuss breathing mechanics, pursed-lip breathing technique,  proper posture, effective ways to clear airways, and other functional breathing techniques   Cleaning Equipment: - Provides group verbal and written instruction about the health risks of elevated stress, cause of high stress, and healthy ways to reduce stress.   Nutrition I: Fats: - Discuss the types of  cholesterol, what cholesterol does to the body, and how cholesterol levels can be controlled.   Nutrition II: Labels: -Discuss the different components of food labels and how to read food labels.   Respiratory Infections: - Discuss the signs and symptoms of respiratory infections, ways to prevent respiratory infections, and the importance of seeking medical treatment when having a respiratory infection.   Stress I: Signs and Symptoms: - Discuss the causes of stress, how stress may lead to anxiety and depression, and ways to limit stress.   PULMONARY REHAB OTHER RESPIRATORY from 11/18/2017 in [place]  Date  10/07/17  Educator  DC  Instruction Review Code  2- Demonstrated Understanding      Stress II: Relaxation: -Discuss relaxation techniques to limit stress.   PULMONARY REHAB OTHER RESPIRATORY from 11/18/2017 in [place]  Date  10/14/17  Educator  DC  Instruction Review Code  2- Demonstrated Understanding      Oxygen for Home/Travel: - Discuss how to prepare for travel when on oxygen and proper ways to transport and store oxygen to ensure safety.   Knowledge Questionnaire Score: Knowledge Questionnaire Score - 10/01/17 1118      Knowledge Questionnaire Score   Pre Score  14/18       Core Components/Risk Factors/Patient Goals at Admission: Personal Goals and Risk Factors at Admission - 10/01/17 1132      Core Components/Risk Factors/Patient Goals on Admission    Weight Management  Obesity    Personal Goal Other  Yes    Personal Goal  Have more energy, breath better, get back to swimming, sing on the choir again.     Intervention  Attend CR 3 x week and supplement with at home exercise 2 x week.     Expected Outcomes  Achieve personal goals.        Core Components/Risk Factors/Patient Goals Review:  Goals and Risk Factor Review    Row Name 11/04/17 0745 11/24/17 1506           Core Components/Risk  Factors/Patient Goals Review   Personal Goals Review  Weight Management/Obesity;Improve shortness of breath with ADL's;Develop more efficient breathing techniques such as purse lipped breathing and diaphragmatic breathing and practicing self-pacing with activity. More energy; breathe better; get back to swimming; sing again.  Weight Management/Obesity;Improve shortness of breath with ADL's;Develop more efficient breathing techniques such  as purse lipped breathing and diaphragmatic breathing and practicing self-pacing with activity. More energy; breathe better; get back to swimming; sing again.       Review  Patient has completed 8 sessions maintaining her weight. She is doing well in the program with some progression. She says she does feel her energy has increased. She is able to demonstrate proper pursed lip breathing techniques in the program during exercise. She does not see any improvement in her breathing yet but hopes to continue to improve as she continues the program. Will continue to monitor for progress.   Patient has completed 13 sessions losing 2 lbs since last 30 day review. She continues to do well in the program with progressions. She continues to say she feels stronger and feels she is less SOB with activities. She is now able to participate in 2 classes at the [facility] and is planning to join a group of women that walk in her neighborhood. She is very pleased with her progress and hopes to see even more improvements as she continues. Will continue to monitor for progress.       Expected Outcomes  Patient will continue to attend sessions and complete the program meeting her personal goals.   Patient will continue to attend sessions and complete the program meeting her personal goals.          Core Components/Risk Factors/Patient Goals at Discharge (Final Review):  Goals and Risk Factor Review - 11/24/17 1506      Core Components/Risk Factors/Patient Goals Review   Personal Goals Review   Weight Management/Obesity;Improve shortness of breath with ADL's;Develop more efficient breathing techniques such as purse lipped breathing and diaphragmatic breathing and practicing self-pacing with activity.   More energy; breathe better; get back to swimming; sing again.    Review  Patient has completed 13 sessions losing 2 lbs since last 30 day review. She continues to do well in the program with progressions. She continues to say she feels stronger and feels she is less SOB with activities. She is now able to participate in 2 classes at the [facility] and is planning to join a group of women that walk in her neighborhood. She is very pleased with her progress and hopes to see even more improvements as she continues. Will continue to monitor for progress.     Expected Outcomes  Patient will continue to attend sessions and complete the program meeting her personal goals.        ITP Comments: ITP Comments    Row Name 10/06/17 1353 10/07/17 1504         ITP Comments  Patient new to program. She has completed 2 sessions. Will continue to monitor for progress.   Patient attended the Family Matters class with hospital chaplian to discuss how this event has impacted their life.          Comments: ITP REVIEW Pt is making expected progress toward pulmonary rehab goals after completing 13 sessions. Recommend continued exercise, life style modification, education, and utilization of breathing techniques to increase stamina and strength and decrease shortness of breath with exertion.

## 2017-11-25 ENCOUNTER — Encounter (HOSPITAL_COMMUNITY)
Admission: RE | Admit: 2017-11-25 | Discharge: 2017-11-25 | Disposition: A | Discharge status: Home / Self Care | Payer: Medicare Other | Source: Ambulatory Visit | Attending: Internal Medicine | Admitting: Internal Medicine

## 2017-11-25 ENCOUNTER — Telehealth: Payer: Self-pay | Admitting: Internal Medicine

## 2017-11-25 DIAGNOSIS — I480 Paroxysmal atrial fibrillation: Secondary | ICD-10-CM | POA: Diagnosis not present

## 2017-11-25 DIAGNOSIS — J4550 Severe persistent asthma, uncomplicated: Secondary | ICD-10-CM | POA: Diagnosis not present

## 2017-11-25 DIAGNOSIS — I4892 Unspecified atrial flutter: Secondary | ICD-10-CM | POA: Diagnosis not present

## 2017-11-25 DIAGNOSIS — F419 Anxiety disorder, unspecified: Secondary | ICD-10-CM | POA: Diagnosis not present

## 2017-11-25 DIAGNOSIS — F329 Major depressive disorder, single episode, unspecified: Secondary | ICD-10-CM | POA: Diagnosis not present

## 2017-11-25 DIAGNOSIS — G4733 Obstructive sleep apnea (adult) (pediatric): Secondary | ICD-10-CM | POA: Diagnosis not present

## 2017-11-25 NOTE — Telephone Encounter (Signed)
Patient brought in a letter from [employer] stating that they needed more information to process her patient assistance application. Patient brought in the additional forms. Copies made of paper work and message routed to [facility] for follow up.

## 2017-11-25 NOTE — Progress Notes (Addendum)
Daily Session Note  Patient Details  Name: [name] [name] MRN: [id number] Date of Birth: [birth year] Referring Provider:     PULMONARY REHAB OTHER RESPIRATORY from 10/01/2017 in [place]  Referring Provider  Dr. Annamaria Boots      Encounter Date: 11/25/2017  Check In: Session Check In - 11/25/17 1330      Check-In   Supervising physician immediately available to respond to emergencies  See telemetry face sheet for immediately available MD    Location  [facility]    Staff Present  Russella Dar, MS, EP, [facility], Exercise Physiologist;Other;Liviah Cake Wynetta Emery, RN, BSN    Medication changes reported      No    Fall or balance concerns reported     No    Warm-up and Cool-down  Performed as group-led instruction    Resistance Training Performed  Yes    VAD Patient?  No    PAD/SET Patient?  No      Pain Assessment   Currently in Pain?  Yes    Pain Score  7     Pain Location  Shoulder    Pain Orientation  Right;Left    Pain Descriptors / Indicators  Aching;Cramping    Pain Type  Acute pain    Pain Radiating Towards  No radiation    Pain Onset  In the past 7 days    Pain Frequency  Rarely    Aggravating Factors   Certain movements. Patient thinks she pulled a muscle.     Pain Relieving Factors  Rest. Patient is taking Baclofen for pain relief over the past few days. She has not taken any today. She does not remember the name of the medication.    Effect of Pain on Daily Activities  Limits mobility in upper extremities.     Multiple Pain Sites  No       Capillary Blood Glucose: No results found for this or any previous visit (from the past 24 hour(s)).    Social History   Tobacco Use  Smoking Status Never Smoker  Smokeless Tobacco Never Used    Goals Met:  Proper associated with RPD/PD & O2 Sat Independence with exercise equipment Improved SOB with ADL's Using PLB without cueing & demonstrates good technique Achieving weight loss Exercise  tolerated well No report of cardiac concerns or symptoms Strength training completed today  Goals Unmet:  Not Applicable  Comments: Pt able to follow exercise prescription today without complaint.  Will continue to monitor for progression. Check out 1430.   Dr. Sinda Du is Medical Director for [facility] Pulmonary Rehab.

## 2017-11-26 DIAGNOSIS — M549 Dorsalgia, unspecified: Secondary | ICD-10-CM | POA: Diagnosis not present

## 2017-11-26 DIAGNOSIS — I10 Essential (primary) hypertension: Secondary | ICD-10-CM | POA: Diagnosis not present

## 2017-11-26 DIAGNOSIS — Z713 Dietary counseling and surveillance: Secondary | ICD-10-CM | POA: Diagnosis not present

## 2017-11-26 DIAGNOSIS — Z299 Encounter for prophylactic measures, unspecified: Secondary | ICD-10-CM | POA: Diagnosis not present

## 2017-11-26 DIAGNOSIS — Z6836 Body mass index (BMI) 36.0-36.9, adult: Secondary | ICD-10-CM | POA: Diagnosis not present

## 2017-11-26 NOTE — Telephone Encounter (Signed)
Katie please advise on this, thank you.  

## 2017-11-30 ENCOUNTER — Encounter (HOSPITAL_COMMUNITY): Payer: Medicare Other

## 2017-11-30 ENCOUNTER — Telehealth: Payer: Self-pay | Admitting: Internal Medicine

## 2017-11-30 NOTE — Telephone Encounter (Signed)
1 Vial Order Date: 11/30/17 Shipping Date: 11/30/17

## 2017-12-01 ENCOUNTER — Ambulatory Visit: Payer: Medicare Other

## 2017-12-01 ENCOUNTER — Ambulatory Visit (INDEPENDENT_AMBULATORY_CARE_PROVIDER_SITE_OTHER): Payer: Medicare Other

## 2017-12-01 DIAGNOSIS — J4550 Severe persistent asthma, uncomplicated: Secondary | ICD-10-CM | POA: Diagnosis not present

## 2017-12-01 MED ORDER — MEPOLIZUMAB 100 MG ~~LOC~~ SOLR
100.0000 mg | Freq: Once | SUBCUTANEOUS | Status: AC
  Administered 2017-12-01: 100 mg via SUBCUTANEOUS

## 2017-12-01 NOTE — Telephone Encounter (Signed)
Katie please advise. Thanks. 

## 2017-12-01 NOTE — Telephone Encounter (Signed)
I will check back with company this afternoon.

## 2017-12-02 ENCOUNTER — Encounter (HOSPITAL_COMMUNITY): Payer: Medicare Other

## 2017-12-02 DIAGNOSIS — M9903 Segmental and somatic dysfunction of lumbar region: Secondary | ICD-10-CM | POA: Diagnosis not present

## 2017-12-02 DIAGNOSIS — M546 Pain in thoracic spine: Secondary | ICD-10-CM | POA: Diagnosis not present

## 2017-12-02 DIAGNOSIS — M9905 Segmental and somatic dysfunction of pelvic region: Secondary | ICD-10-CM | POA: Diagnosis not present

## 2017-12-02 DIAGNOSIS — M9902 Segmental and somatic dysfunction of thoracic region: Secondary | ICD-10-CM | POA: Diagnosis not present

## 2017-12-02 DIAGNOSIS — M545 Low back pain: Secondary | ICD-10-CM | POA: Diagnosis not present

## 2017-12-02 DIAGNOSIS — M9901 Segmental and somatic dysfunction of cervical region: Secondary | ICD-10-CM | POA: Diagnosis not present

## 2017-12-02 DIAGNOSIS — M542 Cervicalgia: Secondary | ICD-10-CM | POA: Diagnosis not present

## 2017-12-02 NOTE — Telephone Encounter (Signed)
1 vial Arrival Date:10/31/17 Lot #: [id number] Exp Date:04/2021

## 2017-12-03 DIAGNOSIS — M9901 Segmental and somatic dysfunction of cervical region: Secondary | ICD-10-CM | POA: Diagnosis not present

## 2017-12-03 DIAGNOSIS — M542 Cervicalgia: Secondary | ICD-10-CM | POA: Diagnosis not present

## 2017-12-03 DIAGNOSIS — M9905 Segmental and somatic dysfunction of pelvic region: Secondary | ICD-10-CM | POA: Diagnosis not present

## 2017-12-03 DIAGNOSIS — M9902 Segmental and somatic dysfunction of thoracic region: Secondary | ICD-10-CM | POA: Diagnosis not present

## 2017-12-03 DIAGNOSIS — M546 Pain in thoracic spine: Secondary | ICD-10-CM | POA: Diagnosis not present

## 2017-12-03 DIAGNOSIS — M545 Low back pain: Secondary | ICD-10-CM | POA: Diagnosis not present

## 2017-12-03 DIAGNOSIS — M9903 Segmental and somatic dysfunction of lumbar region: Secondary | ICD-10-CM | POA: Diagnosis not present

## 2017-12-06 DIAGNOSIS — Z299 Encounter for prophylactic measures, unspecified: Secondary | ICD-10-CM | POA: Diagnosis not present

## 2017-12-06 DIAGNOSIS — M9905 Segmental and somatic dysfunction of pelvic region: Secondary | ICD-10-CM | POA: Diagnosis not present

## 2017-12-06 DIAGNOSIS — J449 Chronic obstructive pulmonary disease, unspecified: Secondary | ICD-10-CM | POA: Diagnosis not present

## 2017-12-06 DIAGNOSIS — M545 Low back pain: Secondary | ICD-10-CM | POA: Diagnosis not present

## 2017-12-06 DIAGNOSIS — M546 Pain in thoracic spine: Secondary | ICD-10-CM | POA: Diagnosis not present

## 2017-12-06 DIAGNOSIS — I10 Essential (primary) hypertension: Secondary | ICD-10-CM | POA: Diagnosis not present

## 2017-12-06 DIAGNOSIS — M542 Cervicalgia: Secondary | ICD-10-CM | POA: Diagnosis not present

## 2017-12-06 DIAGNOSIS — M9901 Segmental and somatic dysfunction of cervical region: Secondary | ICD-10-CM | POA: Diagnosis not present

## 2017-12-06 DIAGNOSIS — M9903 Segmental and somatic dysfunction of lumbar region: Secondary | ICD-10-CM | POA: Diagnosis not present

## 2017-12-06 DIAGNOSIS — Z6836 Body mass index (BMI) 36.0-36.9, adult: Secondary | ICD-10-CM | POA: Diagnosis not present

## 2017-12-06 DIAGNOSIS — I4891 Unspecified atrial fibrillation: Secondary | ICD-10-CM | POA: Diagnosis not present

## 2017-12-06 DIAGNOSIS — B079 Viral wart, unspecified: Secondary | ICD-10-CM | POA: Diagnosis not present

## 2017-12-06 DIAGNOSIS — M9902 Segmental and somatic dysfunction of thoracic region: Secondary | ICD-10-CM | POA: Diagnosis not present

## 2017-12-07 ENCOUNTER — Encounter (HOSPITAL_COMMUNITY): Payer: Medicare Other

## 2017-12-07 NOTE — Telephone Encounter (Signed)
Will route to Katie to follow up

## 2017-12-08 NOTE — Telephone Encounter (Signed)
I have refaxed forms to [employer] patient assistance again today-was told they never rec'd the forms last time. Will give it until Friday morning and then recheck with [place] for approval for [employer] patient assistance.

## 2017-12-09 ENCOUNTER — Encounter (HOSPITAL_COMMUNITY): Payer: Medicare Other

## 2017-12-09 DIAGNOSIS — M545 Low back pain: Secondary | ICD-10-CM | POA: Diagnosis not present

## 2017-12-09 DIAGNOSIS — M9901 Segmental and somatic dysfunction of cervical region: Secondary | ICD-10-CM | POA: Diagnosis not present

## 2017-12-09 DIAGNOSIS — M9903 Segmental and somatic dysfunction of lumbar region: Secondary | ICD-10-CM | POA: Diagnosis not present

## 2017-12-09 DIAGNOSIS — M542 Cervicalgia: Secondary | ICD-10-CM | POA: Diagnosis not present

## 2017-12-09 DIAGNOSIS — M9905 Segmental and somatic dysfunction of pelvic region: Secondary | ICD-10-CM | POA: Diagnosis not present

## 2017-12-09 DIAGNOSIS — M9902 Segmental and somatic dysfunction of thoracic region: Secondary | ICD-10-CM | POA: Diagnosis not present

## 2017-12-09 DIAGNOSIS — M546 Pain in thoracic spine: Secondary | ICD-10-CM | POA: Diagnosis not present

## 2017-12-14 ENCOUNTER — Encounter (HOSPITAL_COMMUNITY)
Admission: RE | Admit: 2017-12-14 | Discharge: 2017-12-14 | Disposition: A | Discharge status: Home / Self Care | Payer: Medicare Other | Source: Ambulatory Visit | Attending: Internal Medicine | Admitting: Internal Medicine

## 2017-12-14 DIAGNOSIS — Z79899 Other long term (current) drug therapy: Secondary | ICD-10-CM | POA: Insufficient documentation

## 2017-12-14 DIAGNOSIS — I4892 Unspecified atrial flutter: Secondary | ICD-10-CM | POA: Diagnosis not present

## 2017-12-14 DIAGNOSIS — I480 Paroxysmal atrial fibrillation: Secondary | ICD-10-CM | POA: Diagnosis not present

## 2017-12-14 DIAGNOSIS — J4550 Severe persistent asthma, uncomplicated: Secondary | ICD-10-CM | POA: Diagnosis not present

## 2017-12-14 DIAGNOSIS — Z7901 Long term (current) use of anticoagulants: Secondary | ICD-10-CM | POA: Diagnosis not present

## 2017-12-14 DIAGNOSIS — F329 Major depressive disorder, single episode, unspecified: Secondary | ICD-10-CM | POA: Diagnosis not present

## 2017-12-14 DIAGNOSIS — J449 Chronic obstructive pulmonary disease, unspecified: Secondary | ICD-10-CM | POA: Diagnosis not present

## 2017-12-14 DIAGNOSIS — I10 Essential (primary) hypertension: Secondary | ICD-10-CM | POA: Diagnosis not present

## 2017-12-14 DIAGNOSIS — E669 Obesity, unspecified: Secondary | ICD-10-CM | POA: Diagnosis not present

## 2017-12-14 DIAGNOSIS — E785 Hyperlipidemia, unspecified: Secondary | ICD-10-CM | POA: Insufficient documentation

## 2017-12-14 DIAGNOSIS — G4733 Obstructive sleep apnea (adult) (pediatric): Secondary | ICD-10-CM | POA: Insufficient documentation

## 2017-12-14 DIAGNOSIS — F419 Anxiety disorder, unspecified: Secondary | ICD-10-CM | POA: Diagnosis not present

## 2017-12-14 NOTE — Progress Notes (Signed)
Daily Session Note  Patient Details  Name: [name] [name] MRN: [id number] Date of Birth: 1940/04/23 Referring Provider:     PULMONARY REHAB OTHER RESPIRATORY from 10/01/2017 in [place]  Referring Provider  Dr. Annamaria Boots      Encounter Date: 12/14/2017  Check In: Session Check In - 12/14/17 1407      Check-In   Supervising physician immediately available to respond to emergencies  See telemetry face sheet for immediately available MD    Location  [facility]    Staff Present  Russella Dar, MS, EP, [facility], Exercise Physiologist;Maleena Eddleman Zachery Conch, Exercise Physiologist;Other    Medication changes reported      No    Fall or balance concerns reported     No    Warm-up and Cool-down  Performed as group-led instruction    Resistance Training Performed  Yes    VAD Patient?  No    PAD/SET Patient?  No      Pain Assessment   Currently in Pain?  No/denies    Pain Score  0-No pain    Multiple Pain Sites  No       Capillary Blood Glucose: No results found for this or any previous visit (from the past 24 hour(s)).    Social History   Tobacco Use  Smoking Status Never Smoker  Smokeless Tobacco Never Used    Goals Met:  Independence with exercise equipment Exercise tolerated well No report of cardiac concerns or symptoms Strength training completed today  Goals Unmet:  Not Applicable  Comments: Pt able to follow exercise prescription today without complaint.  Will continue to monitor for progression. Check out 2:30.   Dr. Sinda Du is Medical Director for [facility] Pulmonary Rehab.

## 2017-12-16 ENCOUNTER — Encounter (HOSPITAL_COMMUNITY)
Admission: RE | Admit: 2017-12-16 | Discharge: 2017-12-16 | Disposition: A | Discharge status: Home / Self Care | Payer: Medicare Other | Source: Ambulatory Visit | Attending: Internal Medicine | Admitting: Internal Medicine

## 2017-12-16 DIAGNOSIS — J4550 Severe persistent asthma, uncomplicated: Secondary | ICD-10-CM | POA: Diagnosis not present

## 2017-12-16 DIAGNOSIS — I4892 Unspecified atrial flutter: Secondary | ICD-10-CM | POA: Diagnosis not present

## 2017-12-16 DIAGNOSIS — I480 Paroxysmal atrial fibrillation: Secondary | ICD-10-CM | POA: Diagnosis not present

## 2017-12-16 DIAGNOSIS — G4733 Obstructive sleep apnea (adult) (pediatric): Secondary | ICD-10-CM | POA: Diagnosis not present

## 2017-12-16 DIAGNOSIS — F329 Major depressive disorder, single episode, unspecified: Secondary | ICD-10-CM | POA: Diagnosis not present

## 2017-12-16 DIAGNOSIS — F419 Anxiety disorder, unspecified: Secondary | ICD-10-CM | POA: Diagnosis not present

## 2017-12-16 NOTE — Progress Notes (Signed)
Pulmonary Individual Treatment Plan  Patient Details  Name: [name] [name] MRN: [id number] Date of Birth: 23-Feb-1941 Referring Provider:     PULMONARY REHAB OTHER RESPIRATORY from 10/01/2017 in [facility]  Referring Provider  Dr. Annamaria Boots      Initial Encounter Date:    PULMONARY REHAB OTHER RESPIRATORY from 10/01/2017 in [facility]  Date  10/01/17      Visit Diagnosis: Asthma in adult, severe persistent, uncomplicated  Patient's Home Medications on Admission:   Current Outpatient Medications:  .  acetaminophen (TYLENOL) 500 MG tablet, Take 500 mg by mouth daily as needed for headache., Disp: , Rfl:  .  antiseptic oral rinse (BIOTENE) LIQD, 15 mLs by Mouth Rinse route as needed for dry mouth., Disp: , Rfl:  .  atorvastatin (LIPITOR) 10 MG tablet, Take 10 mg by mouth every evening. , Disp: , Rfl:  .  baclofen (LIORESAL) 10 MG tablet, Take 10 mg by mouth as needed for muscle spasms., Disp: , Rfl:  .  benzonatate (TESSALON) 200 MG capsule, Take 1 capsule (200 mg total) by mouth 3 (three) times daily as needed for cough., Disp: 30 capsule, Rfl: 3 .  Biotin 5000 MCG CAPS, Take 5,000 mcg by mouth daily., Disp: , Rfl:  .  budesonide (PULMICORT) 0.5 MG/2ML nebulizer solution, Take 0.5 mg by nebulization 2 (two) times daily. , Disp: , Rfl:  .  cetirizine (ZYRTEC) 10 MG tablet, Take 10 mg by mouth daily as needed for allergies., Disp: , Rfl:  .  clindamycin (CLEOCIN) 300 MG capsule, 2 capsules as needed. Take prior to dental procedures, Disp: , Rfl: 0 .  cycloSPORINE (RESTASIS) 0.05 % ophthalmic emulsion, Place 1 drop 2 (two) times daily into both eyes. , Disp: , Rfl:  .  diltiazem (CARTIA XT) 120 MG 24 hr capsule, Take 120 mg by mouth at bedtime. , Disp: , Rfl:  .  dofetilide (TIKOSYN) 500 MCG capsule, Take 1 capsule (500 mcg total) by mouth 2 (two) times daily., Disp: 60 capsule, Rfl: 6 .  famotidine (PEPCID) 20 MG tablet, Take 1 tablet (20 mg  total) by mouth at bedtime., Disp: 30 tablet, Rfl: 2 .  fluticasone (FLONASE) 50 MCG/ACT nasal spray, Place 1 spray into both nostrils 2 (two) times daily., Disp: , Rfl:  .  HYDROcodone-homatropine (HYDROMET) 5-1.5 MG/5ML syrup, Take 5 mLs by mouth every 6 (six) hours as needed for cough., Disp: 120 mL, Rfl: 0 .  Hypromellose (ARTIFICIAL TEARS OP), Apply 1 drop to eye every 4 (four) hours as needed (dry eyes). , Disp: , Rfl:  .  ipratropium (ATROVENT) 0.06 % nasal spray, Place 2 sprays into both nostrils 4 (four) times daily as needed for rhinitis. , Disp: , Rfl:  .  ipratropium-albuterol (DUONEB) 0.5-2.5 (3) MG/3ML SOLN, Take 3 mLs by nebulization 2 (two) times daily. , Disp: , Rfl:  .  losartan (COZAAR) 100 MG tablet, Take 1 tablet (100 mg total) by mouth daily., Disp: 90 tablet, Rfl: 2 .  montelukast (SINGULAIR) 10 MG tablet, Take 10 mg by mouth at bedtime., Disp: , Rfl:  .  nystatin (MYCOSTATIN) 100000 UNIT/ML suspension, Take 5 mLs (500,000 Units total) by mouth 4 (four) times daily., Disp: 60 mL, Rfl: 0 .  omeprazole (PRILOSEC) 20 MG capsule, Take 20 mg by mouth daily., Disp: , Rfl:  .  potassium chloride SA (K-DUR,KLOR-CON) 20 MEQ tablet, Take 1 tablet (20 mEq total) by mouth daily., Disp: 30 tablet, Rfl: 6 .  sodium  chloride (OCEAN) 0.65 % SOLN nasal spray, Place 1 spray into both nostrils 2 (two) times daily. , Disp: , Rfl:  .  triamcinolone ointment (KENALOG) 0.1 %, Apply 1 application topically daily as needed for rash., Disp: , Rfl: 2 .  umeclidinium-vilanterol (ANORO ELLIPTA) 62.5-25 MCG/INH AEPB, Inhale 1 puff into the lungs daily., Disp: 3 each, Rfl: 3 .  WARFARIN SODIUM PO, Take by mouth as directed., Disp: , Rfl:  .  Wheat Dextrin (BENEFIBER ON THE GO) POWD, Take 1 Dose by mouth daily as needed (constipation). , Disp: , Rfl:   Current Facility-Administered Medications:  Marland Kitchen  Mepolizumab SOLR 100 mg, 100 mg, Subcutaneous, Q28 days, Young, Clinton D, MD, 100 mg at 08/20/17 0850 .   Mepolizumab SOLR 100 mg, 100 mg, Subcutaneous, Q28 days, Young, Clinton D, MD, 100 mg at 11/03/17 1037  Past Medical History: Past Medical History:  Diagnosis Date  . Allergic rhinitis   . Anxiety   . Asthma   . Atrial flutter ([place])   . COPD (chronic obstructive pulmonary disease) ([facility])   . Depression   . Essential hypertension   . Hyperlipidemia   . Lumbar disc disease   . Obesity   . OSA (obstructive sleep apnea)    CPAP  . Paroxysmal atrial fibrillation (H[facility])    Element of tachycardia bradycardia syndrome  . Respiratory failure ([facility])     Tobacco Use: Social History   Tobacco Use  Smoking Status Never Smoker  Smokeless Tobacco Never Used    Labs: Recent Review Flowsheet Data    There is no flowsheet data to display.      Capillary Blood Glucose: No results found for: GLUCAP   Pulmonary Assessment Scores: Pulmonary Assessment Scores    Row Name 10/01/17 1124         ADL UCSD   ADL Phase  Entry     SOB Score total  46     Rest  0     Walk  9     Stairs  4     Bath  2     Dress  2     Shop  2       CAT Score   CAT Score  19       mMRC Score   mMRC Score  3        Pulmonary Function Assessment: Pulmonary Function Assessment - 10/01/17 1123      Pulmonary Function Tests   FVC%  79 %    FEV1%  78 %    FEV1/FVC Ratio  99    RV%  92 %    DLCO%  81 %      Initial Spirometry Results   FVC%  79 %    FEV1%  78 %    FEV1/FVC Ratio  99      Post Bronchodilator Spirometry Results   FVC%  77 %    FEV1%  83 %    FEV1/FVC Ratio  108      Breath   Bilateral Breath Sounds  Clear    Shortness of Breath  Yes       Exercise Target Goals: Exercise Program Goal: Individual exercise prescription set using results from initial 6 min walk test and THRR while considering  patient's activity barriers and safety.   Exercise Prescription Goal: Initial exercise prescription builds to 30-45 minutes a day of aerobic activity, 2-3 days per week.  Home  exercise guidelines will be given to patient during  program as part of exercise prescription that the participant will acknowledge.  Activity Barriers & Risk Stratification: Activity Barriers & Cardiac Risk Stratification - 10/01/17 1007      Activity Barriers & Cardiac Risk Stratification   Activity Barriers  Back Problems;Deconditioning;Shortness of Breath;Left Hip Replacement;Right Hip Replacement;Other (comment)    Comments  Patient great toe and 2nd toes are crossed. has been told by her doctor not to do activities that make her go up on her toes.     Cardiac Risk Stratification  Moderate       6 Minute Walk: 6 Minute Walk    Row Name 10/01/17 1006         6 Minute Walk   Phase  Initial     Distance  900 feet     Distance % Change  0 %     Distance Feet Change  0 ft     Walk Time  6 minutes     # of Rest Breaks  0     MPH  1.7     METS  2.3     RPE  13     Perceived Dyspnea   14     VO2 Peak  6.24     Symptoms  Yes (comment)     Comments  Patient had to push a wheelchair half way through walk test due to extreme shortness of breath     Resting HR  62 bpm     Resting BP  124/52     Resting Oxygen Saturation   97 %     Exercise Oxygen Saturation  during 6 min walk  98 %     Max Ex. HR  97 bpm     Max Ex. BP  200/62     2 Minute Post BP  154/60        Oxygen Initial Assessment: Oxygen Initial Assessment - 10/01/17 1127      Home Oxygen   Home Oxygen Device  None    Sleep Oxygen Prescription  None    Home Exercise Oxygen Prescription  None    Home at Rest Exercise Oxygen Prescription  None    Compliance with Home Oxygen Use  --   N/A     Initial 6 min Walk   Oxygen Used  None      Program Oxygen Prescription   Program Oxygen Prescription  None       Oxygen Re-Evaluation: Oxygen Re-Evaluation    Row Name 11/04/17 0742 11/24/17 1504 12/16/17 0740         Program Oxygen Prescription   Program Oxygen Prescription  None  None  None       Home Oxygen    Home Oxygen Device  None  None  None     Sleep Oxygen Prescription  None  None  None     Home Exercise Oxygen Prescription  -  None  None     Home at Rest Exercise Oxygen Prescription  None  None  None     Compliance with Home Oxygen Use  Yes  Yes  Yes       Goals/Expected Outcomes   Short Term Goals  To learn and understand importance of maintaining oxygen saturations>88%;To learn and understand importance of monitoring SPO2 with pulse oximeter and demonstrate accurate use of the pulse oximeter.;To learn and demonstrate proper pursed lip breathing techniques or other breathing techniques.  To learn and understand importance of maintaining oxygen saturations>88%;To learn and  understand importance of monitoring SPO2 with pulse oximeter and demonstrate accurate use of the pulse oximeter.;To learn and demonstrate proper pursed lip breathing techniques or other breathing techniques.  To learn and understand importance of maintaining oxygen saturations>88%;To learn and understand importance of monitoring SPO2 with pulse oximeter and demonstrate accurate use of the pulse oximeter.;To learn and demonstrate proper pursed lip breathing techniques or other breathing techniques.     Long  Term Goals  Maintenance of O2 saturations>88%;Verbalizes importance of monitoring SPO2 with pulse oximeter and return demonstration;Exhibits proper breathing techniques, such as pursed lip breathing or other method taught during program session  Maintenance of O2 saturations>88%;Verbalizes importance of monitoring SPO2 with pulse oximeter and return demonstration;Exhibits proper breathing techniques, such as pursed lip breathing or other method taught during program session  Maintenance of O2 saturations>88%;Verbalizes importance of monitoring SPO2 with pulse oximeter and return demonstration;Exhibits proper breathing techniques, such as pursed lip breathing or other method taught during program session     Comments  Patient  verbalizes understanding of maintaining O2 saturation >88% and is able to use pulse oximeter with return demonstration. She exhibits proper pursed lip breathing techniques during exercise.   Patient verbalizes understanding of maintaining O2 saturation >88% and is able to use pulse oximeter with return demonstration. She exhibits proper pursed lip breathing techniques during exercise.   Patient verbalizes understanding of maintaining O2 saturation >88% and is able to use pulse oximeter with return demonstration. She exhibits proper pursed lip breathing techniques during exercise.      Goals/Expected Outcomes  Patient will continue to meet her short and long term goals.   Patient will continue to meet her short and long term goals.   Patient will continue to meet her short and long term goals.         Oxygen Discharge (Final Oxygen Re-Evaluation): Oxygen Re-Evaluation - 12/16/17 0740      Program Oxygen Prescription   Program Oxygen Prescription  None      Home Oxygen   Home Oxygen Device  None    Sleep Oxygen Prescription  None    Home Exercise Oxygen Prescription  None    Home at Rest Exercise Oxygen Prescription  None    Compliance with Home Oxygen Use  Yes      Goals/Expected Outcomes   Short Term Goals  To learn and understand importance of maintaining oxygen saturations>88%;To learn and understand importance of monitoring SPO2 with pulse oximeter and demonstrate accurate use of the pulse oximeter.;To learn and demonstrate proper pursed lip breathing techniques or other breathing techniques.    Long  Term Goals  Maintenance of O2 saturations>88%;Verbalizes importance of monitoring SPO2 with pulse oximeter and return demonstration;Exhibits proper breathing techniques, such as pursed lip breathing or other method taught during program session    Comments  Patient verbalizes understanding of maintaining O2 saturation >88% and is able to use pulse oximeter with return demonstration. She  exhibits proper pursed lip breathing techniques during exercise.     Goals/Expected Outcomes  Patient will continue to meet her short and long term goals.        Initial Exercise Prescription: Initial Exercise Prescription - 10/01/17 1000      Date of Initial Exercise RX and Referring Provider   Date  10/01/17    Referring Provider  Dr. Annamaria Boots      NuStep   Level  1    SPM  77    Minutes  15    METs  1.8  Arm Ergometer   Level  1.2    Watts  16    RPM  22    Minutes  20    METs  2.2      Prescription Details   Frequency (times per week)  2    Duration  Progress to 30 minutes of continuous aerobic without signs/symptoms of physical distress      Intensity   THRR 40-80% of Max Heartrate  [phone]    Ratings of Perceived Exertion  11-13    Perceived Dyspnea  0-4      Progression   Progression  Continue progressive overload as per policy without signs/symptoms or physical distress.      Resistance Training   Training Prescription  Yes    Weight  1    Reps  10-15       Perform Capillary Blood Glucose checks as needed.  Exercise Prescription Changes:  Exercise Prescription Changes    Row Name 10/17/17 1200 11/02/17 0800 11/22/17 0700 12/03/17 1400       Response to Exercise   Blood Pressure (Admit)  144/60  114/60  122/62  -    Blood Pressure (Exercise)  158/60  148/54  152/70  142/60    Blood Pressure (Exit)  146/60  118/60  132/62  120/68    Heart Rate (Admit)  64 bpm  61 bpm  58 bpm  63 bpm    Heart Rate (Exercise)  83 bpm  80 bpm  83 bpm  77 bpm    Heart Rate (Exit)  68 bpm  67 bpm  69 bpm  64 bpm    Oxygen Saturation (Admit)  97 %  98 %  96 %  96 %    Oxygen Saturation (Exercise)  97 %  96 %  90 %  96 %    Oxygen Saturation (Exit)  95 %  96 %  97 %  98 %    Rating of Perceived Exertion (Exercise)  11  11  11  11     Perceived Dyspnea (Exercise)  12  12  11  11     Duration  Progress to 30 minutes of  aerobic without signs/symptoms of physical distress   Progress to 30 minutes of  aerobic without signs/symptoms of physical distress  Progress to 30 minutes of  aerobic without signs/symptoms of physical distress  Progress to 30 minutes of  aerobic without signs/symptoms of physical distress    Intensity  THRR New 96-111-127  THRR New 93-110-126  THRR New 92-108-125  THRR unchanged      Progression   Progression  Continue to progress workloads to maintain intensity without signs/symptoms of physical distress.  Continue to progress workloads to maintain intensity without signs/symptoms of physical distress.  Continue to progress workloads to maintain intensity without signs/symptoms of physical distress.  Continue to progress workloads to maintain intensity without signs/symptoms of physical distress.    Average METs  2.45  2.55  2.8  2.1      Resistance Training   Training Prescription  Yes  Yes  Yes  Yes    Weight  1  2  3  3     Reps  10-15  10-15  10-15  10-15    Time  5 Minutes  5 Minutes  5 Minutes  5 Minutes      NuStep   Level  1  2  2  1     SPM  99  100  110  108  Minutes  15  15  17  17     METs  2.6  2.7  2.9  2.9      Arm Ergometer   Level  1.2  1.3  1.3  1.3    Watts  18  19  72  72    RPM  23  67  70  70    Minutes  20  20  22  22     METs  2.3  2.4  2.7  2.7      Home Exercise Plan   Plans to continue exercise at  Home (comment)  Home (comment)  Home (comment)  Home (comment)    Frequency  Add 3 additional days to program exercise sessions.  Add 3 additional days to program exercise sessions.  Add 3 additional days to program exercise sessions.  Add 3 additional days to program exercise sessions.    Initial Home Exercises Provided  10/18/17  10/18/17  10/18/17  10/18/17       Exercise Comments:  Exercise Comments    Row Name 11/02/17 [phone] 11/22/17 0738 12/03/17 1426 12/14/17 1191     Exercise Comments  Patient is breathing better. Able to do more and does feel like the program is helping her achieve her goals.    patient feels like the program is helping her. She has been going to strength training classes at the [facility].   patient feels like the program is helping her. She has been going to strength training classes at the [facility].   Patient feels like the program is helping her. Aside from the recent set back with her neck, she feels she is gaining strength.        Exercise Goals and Review:  Exercise Goals    Row Name 10/01/17 1009             Exercise Goals   Increase Physical Activity  Yes       Intervention  Develop an individualized exercise prescription for aerobic and resistive training based on initial evaluation findings, risk stratification, comorbidities and participant's personal goals.;Provide advice, education, support and counseling about physical activity/exercise needs.       Expected Outcomes  Short Term: Attend rehab on a regular basis to increase amount of physical activity.       Increase Strength and Stamina  Yes       Intervention  Provide advice, education, support and counseling about physical activity/exercise needs.;Develop an individualized exercise prescription for aerobic and resistive training based on initial evaluation findings, risk stratification, comorbidities and participant's personal goals.       Expected Outcomes  Short Term: Increase workloads from initial exercise prescription for resistance, speed, and METs.       Able to understand and use rate of perceived exertion (RPE) scale  Yes       Intervention  Provide education and explanation on how to use RPE scale       Expected Outcomes  Short Term: Able to use RPE daily in rehab to express subjective intensity level;Long Term:  Able to use RPE to guide intensity level when exercising independently       Able to understand and use Dyspnea scale  Yes       Intervention  Provide education and explanation on how to use Dyspnea scale       Expected Outcomes  Short Term: Able to use Dyspnea scale daily in rehab to  express subjective sense of shortness of breath during  exertion;Long Term: Able to use Dyspnea scale to guide intensity level when exercising independently       Knowledge and understanding of Target Heart Rate Range (THRR)  Yes       Intervention  Provide education and explanation of THRR including how the numbers were predicted and where they are located for reference       Expected Outcomes  Short Term: Able to state/look up THRR;Short Term: Able to use daily as guideline for intensity in rehab;Long Term: Able to use THRR to govern intensity when exercising independently       Understanding of Exercise Prescription  Yes       Intervention  Provide education, explanation, and written materials on patient's individual exercise prescription       Expected Outcomes  Short Term: Able to explain program exercise prescription;Long Term: Able to explain home exercise prescription to exercise independently          Exercise Goals Re-Evaluation : Exercise Goals Re-Evaluation    Row Name 10/04/17 1440 11/02/17 0840 11/22/17 0735 12/03/17 1425 12/14/17 0805     Exercise Goal Re-Evaluation   Exercise Goals Review  Increase Physical Activity;Increase Strength and Stamina;Able to understand and use rate of perceived exertion (RPE) scale;Able to understand and use Dyspnea scale;Knowledge and understanding of Target Heart Rate Range (THRR);Understanding of Exercise Prescription  Increase Strength and Stamina;Increase Physical Activity;Able to understand and use rate of perceived exertion (RPE) scale;Able to understand and use Dyspnea scale;Understanding of Exercise Prescription;Knowledge and understanding of Target Heart Rate Range (THRR)  Increase Strength and Stamina;Able to understand and use Dyspnea scale;Understanding of Exercise Prescription  Increase Strength and Stamina;Able to understand and use Dyspnea scale;Understanding of Exercise Prescription  Increase Strength and Stamina;Able to understand and use  Dyspnea scale;Understanding of Exercise Prescription   Comments  Patient has not yet started PR. Patient will be progressed in time in accordance with her goals.   Patient is progessing at a steady rate. Patient is able to handle increases in weights and equipment workloads.   Patient has said that she has been able to return to the [facility] to attend several fitness classes. she still wants to be able to return to the pool to do water aerobics.   Patient has said that she has been able to return to the [facility] to attend several fitness classes. She still wants to be able to return to the pool to do water aerobics.   Patient has been out due to having extreme soreness in neck. Has had to see a chriopractor and seek the assistance of massage therapyin an effort to ease the pain and discomfort. She called and said her doctors says she can return with doing everything in a light manner.    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   Get back to swimming and be able to sing on the choir again.   Get back to swimming and be able to sing on the choir again.       Discharge Exercise Prescription (Final Exercise Prescription Changes): Exercise Prescription Changes - 12/03/17 1400      Response to Exercise   Blood Pressure (Exercise)  142/60  Blood Pressure (Exit)  120/68    Heart Rate (Admit)  63 bpm    Heart Rate (Exercise)  77 bpm    Heart Rate (Exit)  64 bpm    Oxygen Saturation (Admit)  96 %    Oxygen Saturation (Exercise)  96 %    Oxygen Saturation (Exit)  98 %    Rating of Perceived Exertion (Exercise)  11    Perceived Dyspnea (Exercise)  11    Duration  Progress to 30 minutes of  aerobic without signs/symptoms of physical distress     Intensity  THRR unchanged      Progression   Progression  Continue to progress workloads to maintain intensity without signs/symptoms of physical distress.    Average METs  2.1      Resistance Training   Training Prescription  Yes    Weight  3    Reps  10-15    Time  5 Minutes      NuStep   Level  1    SPM  108    Minutes  17    METs  2.9      Arm Ergometer   Level  1.3    Watts  72    RPM  70    Minutes  22    METs  2.7      Home Exercise Plan   Plans to continue exercise at  Home (comment)    Frequency  Add 3 additional days to program exercise sessions.    Initial Home Exercises Provided  10/18/17       Nutrition:  Target Goals: Understanding of nutrition guidelines, daily intake of sodium 1500mg , cholesterol 200mg , calories 30% from fat and 7% or less from saturated fats, daily to have 5 or more servings of fruits and vegetables.  Biometrics: Pre Biometrics - 10/01/17 1010      Pre Biometrics   Height  5\' 3"  (1.6 m)    Weight  92 kg    Waist Circumference  48 inches    Hip Circumference  39.5 inches    Waist to Hip Ratio  1.22 %    BMI (Calculated)  35.93    Triceps Skinfold  5 mm    % Body Fat  40.2 %    Grip Strength  51.06 kg    Flexibility  0 in    Single Leg Stand  7 seconds        Nutrition Therapy Plan and Nutrition Goals: Nutrition Therapy & Goals - 10/07/17 1503      Personal Nutrition Goals   Nutrition Goal  For heart healthy choices add >50% of whole grains, make half their plate fruits and vegetables. Discuss the difference between starchy vegetables and leafy greens, and how leafy vegetables provide fiber, helps maintain healthy weight, helps control blood glucose, and lowers cholesterol.  Discuss purchasing fresh or frozen vegetable to reduce sodium and not to add grease, fat or sugar. Consume <18oz of red meat per week. Consume lean cuts of meats and very little of meats high in sodium and nitrates such as pork and lunch meats.  Discussed portion control for all food groups.        Intervention Plan   Intervention  Nutrition handout(s) given to patient.    Expected Outcomes  Short Term Goal: Understand basic principles of dietary content, such as calories, fat, sodium, cholesterol and nutrients.       Nutrition Assessments: Nutrition Assessments - 10/01/17 1132  MEDFICTS Scores   Pre Score  54       Nutrition Goals Re-Evaluation: Nutrition Goals Re-Evaluation    Row Name 11/04/17 0752 11/24/17 1505 12/16/17 0740         Goals   Current Weight  202 lb (91.6 kg)  200 lb (90.7 kg)  200 lb (90.7 kg)     Nutrition Goal  For heart healthy choices add >50% of whole grains, make half their plate fruits and vegetables. Discuss the difference between starchy vegetables and leafy greens, and how leafy vegetables provide fiber, helps maintain healthy weight, helps control blood glucose, and lowers cholesterol.  Discuss purchasing fresh or frozen vegetable to reduce sodium and not to add grease, fat or sugar. Consume <18oz of red meat per week. Consume lean cuts of meats and very little of meats high in sodium and nitrates such as pork and lunch meats. Discussed portion control for all food groups.    For heart healthy choices add >50% of whole grains, make half their plate fruits and vegetables. Discuss the difference between starchy vegetables and leafy greens, and how leafy vegetables provide fiber, helps maintain healthy weight, helps control blood glucose, and lowers cholesterol.  Discuss purchasing fresh or frozen vegetable to reduce sodium and not to add grease, fat or sugar. Consume <18oz of red meat per week. Consume lean cuts of meats and very little of meats high in sodium and nitrates such as pork and lunch meats. Discussed portion control for all food groups.    For heart healthy choices add >50% of whole grains, make half their plate fruits and vegetables. Discuss the difference between starchy vegetables and  leafy greens, and how leafy vegetables provide fiber, helps maintain healthy weight, helps control blood glucose, and lowers cholesterol.  Discuss purchasing fresh or frozen vegetable to reduce sodium and not to add grease, fat or sugar. Consume <18oz of red meat per week. Consume lean cuts of meats and very little of meats high in sodium and nitrates such as pork and lunch meats. Discussed portion control for all food groups.       Comment  Patient says she is eating heart healthy and is working toward meeting her nutritional goals.   Patient has lost 2 lbs since last 30 day review. She says she is continuing to eat healthy. Will continue to monitor.   Patient has maintained her weight since last 30 day review. She says she is continuing to eat healthy. Will continue to monitor.      Expected Outcome  Patient will continue to work toward meeting her nutritional needes. Will continue to monitor for progress.   Patient will continue to work toward meeting her nutritional needes. Will continue to monitor for progress.   Patient will continue to work toward meeting her nutritional needes. Will continue to monitor for progress.        Personal Goal #2 Re-Evaluation   Personal Goal #2  Patient is working on eating heart healthy. Handout given for her to improve on her food choices.   Patient is working on eating heart healthy. Handout given for her to improve on her food choices.   Patient is working on eating heart healthy. Handout given for her to improve on her food choices.         Nutrition Goals Discharge (Final Nutrition Goals Re-Evaluation): Nutrition Goals Re-Evaluation - 12/16/17 0740      Goals   Current Weight  200 lb (90.7 kg)  Nutrition Goal  For heart healthy choices add >50% of whole grains, make half their plate fruits and vegetables. Discuss the difference between starchy vegetables and leafy greens, and how leafy vegetables provide fiber, helps maintain healthy weight, helps control  blood glucose, and lowers cholesterol.  Discuss purchasing fresh or frozen vegetable to reduce sodium and not to add grease, fat or sugar. Consume <18oz of red meat per week. Consume lean cuts of meats and very little of meats high in sodium and nitrates such as pork and lunch meats. Discussed portion control for all food groups.      Comment  Patient has maintained her weight since last 30 day review. She says she is continuing to eat healthy. Will continue to monitor.     Expected Outcome  Patient will continue to work toward meeting her nutritional needes. Will continue to monitor for progress.       Personal Goal #2 Re-Evaluation   Personal Goal #2  Patient is working on eating heart healthy. Handout given for her to improve on her food choices.        Psychosocial: Target Goals: Acknowledge presence or absence of significant depression and/or stress, maximize coping skills, provide positive support system. Participant is able to verbalize types and ability to use techniques and skills needed for reducing stress and depression.  Initial Review & Psychosocial Screening: Initial Psych Review & Screening - 10/01/17 1134      Initial Review   Current issues with  None Identified      Family Dynamics   Good Support System?  Yes      Barriers   Psychosocial barriers to participate in program  There are no identifiable barriers or psychosocial needs.      Screening Interventions   Interventions  Encouraged to exercise    Expected Outcomes  Short Term goal: Identification and review with participant of any Quality of Life or Depression concerns found by scoring the questionnaire.;Long Term goal: The participant improves quality of Life and PHQ9 Scores as seen by post scores and/or verbalization of changes       Quality of Life Scores: Quality of Life - 10/01/17 1010      Quality of Life   Select  Quality of Life      Quality of Life Scores   Health/Function Pre  24.4 %     Socioeconomic Pre  26.75 %    Psych/Spiritual Pre  24.86 %    Family Pre  18 %    GLOBAL Pre  24.55 %      Scores of 19 and below usually indicate a poorer quality of life in these areas.  A difference of  2-3 points is a clinically meaningful difference.  A difference of 2-3 points in the total score of the Quality of Life Index has been associated with significant improvement in overall quality of life, self-image, physical symptoms, and general health in studies assessing change in quality of life.   PHQ-9: Recent Review Flowsheet Data    Depression screen [facility] 2/9 10/01/2017   Decreased Interest 0   Down, Depressed, Hopeless 0   PHQ - 2 Score 0   Altered sleeping 2   Tired, decreased energy 2   Change in appetite 2   Feeling bad or failure about yourself  0   Trouble concentrating 0   Moving slowly or fidgety/restless 0   Suicidal thoughts 0   PHQ-9 Score 6   Difficult doing work/chores Not difficult at all  Interpretation of Total Score  Total Score Depression Severity:  1-4 = Minimal depression, 5-9 = Mild depression, 10-14 = Moderate depression, 15-19 = Moderately severe depression, 20-27 = Severe depression   Psychosocial Evaluation and Intervention: Psychosocial Evaluation - 10/01/17 1135      Psychosocial Evaluation & Interventions   Interventions  Encouraged to exercise with the program and follow exercise prescription    Continue Psychosocial Services   No Follow up required       Psychosocial Re-Evaluation: Psychosocial Re-Evaluation    [place] Name 11/04/17 0749 11/24/17 1508 12/16/17 0744         Psychosocial Re-Evaluation   Current issues with  None Identified  None Identified  None Identified     Comments  Patient initial QOL score was 18 and her PHQ-9 score was 6 with no psychosocial issues identified at orientation. Will continue to monitor.  Patient initial QOL score was 18 and her PHQ-9 score was 6 with no psychosocial issues identified at orientation.  Will continue to monitor.  Patient initial QOL score was 18 and her PHQ-9 score was 6 with no psychosocial issues identified. Will continue to monitor.     Expected Outcomes  Patient will have no psychosocial issues identified at discharge.   Patient will have no psychosocial issues identified at discharge.   Patient will have no psychosocial issues identified at discharge.      Interventions  Relaxation education;Encouraged to attend Pulmonary Rehabilitation for the exercise;Stress management education  Relaxation education;Encouraged to attend Pulmonary Rehabilitation for the exercise;Stress management education  Relaxation education;Encouraged to attend Pulmonary Rehabilitation for the exercise;Stress management education     Continue Psychosocial Services   No Follow up required  No Follow up required  No Follow up required        Psychosocial Discharge (Final Psychosocial Re-Evaluation): Psychosocial Re-Evaluation - 12/16/17 0744      Psychosocial Re-Evaluation   Current issues with  None Identified    Comments  Patient initial QOL score was 18 and her PHQ-9 score was 6 with no psychosocial issues identified. Will continue to monitor.    Expected Outcomes  Patient will have no psychosocial issues identified at discharge.     Interventions  Relaxation education;Encouraged to attend Pulmonary Rehabilitation for the exercise;Stress management education    Continue Psychosocial Services   No Follow up required        Education: Education Goals: Education classes will be provided on a weekly basis, covering required topics. Participant will state understanding/return demonstration of topics presented.  Learning Barriers/Preferences: Learning Barriers/Preferences - 10/01/17 1020      Learning Barriers/Preferences   Learning Barriers  None    Learning Preferences  Skilled Demonstration;Individual Instruction;Group Instruction       Education Topics: How Lungs Work and Diseases: -  Discuss the anatomy of the lungs and diseases that can affect the lungs, such as COPD.   PULMONARY REHAB OTHER RESPIRATORY from 11/25/2017 in [place]  Date  11/04/17  Educator  DC  Instruction Review Code  2- Demonstrated Understanding      Exercise: -Discuss the importance of exercise, FITT principles of exercise, normal and abnormal responses to exercise, and how to exercise safely.   Environmental Irritants: -Discuss types of environmental irritants and how to limit exposure to environmental irritants.   PULMONARY REHAB OTHER RESPIRATORY from 11/25/2017 in [place]  Date  11/11/17  Educator  Etheleen Mayhew  Instruction Review Code  2- Demonstrated Understanding  Meds/Inhalers and oxygen: - Discuss respiratory medications, definition of an inhaler and oxygen, and the proper way to use an inhaler and oxygen.   PULMONARY REHAB OTHER RESPIRATORY from 11/25/2017 in [place]  Date  11/18/17  Educator  D. Wynetta Emery      Energy Saving Techniques: - Discuss methods to conserve energy and decrease shortness of breath when performing activities of daily living.    PULMONARY REHAB OTHER RESPIRATORY from 11/25/2017 in [place]  Date  11/25/17  Educator  Etheleen Mayhew  Instruction Review Code  2- Demonstrated Understanding      Bronchial Hygiene / Breathing Techniques: - Discuss breathing mechanics, pursed-lip breathing technique,  proper posture, effective ways to clear airways, and other functional breathing techniques   Cleaning Equipment: - Provides group verbal and written instruction about the health risks of elevated stress, cause of high stress, and healthy ways to reduce stress.   Nutrition I: Fats: - Discuss the types of cholesterol, what cholesterol does to the body, and how cholesterol levels can be controlled.   Nutrition II: Labels: -Discuss the different components of  food labels and how to read food labels.   Respiratory Infections: - Discuss the signs and symptoms of respiratory infections, ways to prevent respiratory infections, and the importance of seeking medical treatment when having a respiratory infection.   Stress I: Signs and Symptoms: - Discuss the causes of stress, how stress may lead to anxiety and depression, and ways to limit stress.   PULMONARY REHAB OTHER RESPIRATORY from 11/25/2017 in [employer]  Date  10/07/17  Educator  DC  Instruction Review Code  2- Demonstrated Understanding      Stress II: Relaxation: -Discuss relaxation techniques to limit stress.   PULMONARY REHAB OTHER RESPIRATORY from 11/25/2017 in [place]  Date  10/14/17  Educator  DC  Instruction Review Code  2- Demonstrated Understanding      Oxygen for Home/Travel: - Discuss how to prepare for travel when on oxygen and proper ways to transport and store oxygen to ensure safety.   Knowledge Questionnaire Score: Knowledge Questionnaire Score - 10/01/17 1118      Knowledge Questionnaire Score   Pre Score  14/18       Core Components/Risk Factors/Patient Goals at Admission: Personal Goals and Risk Factors at Admission - 10/01/17 1132      Core Components/Risk Factors/Patient Goals on Admission    Weight Management  Obesity    Personal Goal Other  Yes    Personal Goal  Have more energy, breath better, get back to swimming, sing on the choir again.     Intervention  Attend CR 3 x week and supplement with at home exercise 2 x week.     Expected Outcomes  Achieve personal goals.        Core Components/Risk Factors/Patient Goals Review:  Goals and Risk Factor Review    Row Name 11/04/17 0745 11/24/17 1506 12/16/17 0741         Core Components/Risk Factors/Patient Goals Review   Personal Goals Review  Weight Management/Obesity;Improve shortness of breath with ADL's;Develop more efficient breathing  techniques such as purse lipped breathing and diaphragmatic breathing and practicing self-pacing with activity. More energy; breathe better; get back to swimming; sing again.  Weight Management/Obesity;Improve shortness of breath with ADL's;Develop more efficient breathing techniques such as purse lipped breathing and diaphragmatic breathing and practicing self-pacing with activity. More energy; breathe better; get back to swimming; sing again.  Weight Management/Obesity;Improve shortness of breath with ADL's;Develop more efficient breathing techniques such as purse lipped breathing and diaphragmatic breathing and practicing self-pacing with activity. More energy; breathe better; get back to swimming; sing again.      Review  Patient has completed 8 sessions maintaining her weight. She is doing well in the program with some progression. She says she does feel her energy has increased. She is able to demonstrate proper pursed lip breathing techniques in the program during exercise. She does not see any improvement in her breathing yet but hopes to continue to improve as she continues the program. Will continue to monitor for progress.   Patient has completed 13 sessions losing 2 lbs since last 30 day review. She continues to do well in the program with progressions. She continues to say she feels stronger and feels she is less SOB with activities. She is now able to participate in 2 classes at the [facility] and is planning to join a group of women that walk in her neighborhood. She is very pleased with her progress and hopes to see even more improvements as she continues. Will continue to monitor for progress.   Patient has completed 15 sessions maintaining her weight. She has only attended 2 sessions in this 30 day review due to pain in her back and shoulders. She was evaluated by her pcp who says she pulled a muscle. She continues to do well in the program and will hopefully be able to be more consistent in her  attendance. She hopes to get back to her classes at the [facility]. Will continue to monitor for progress.      Expected Outcomes  Patient will continue to attend sessions and complete the program meeting her personal goals.   Patient will continue to attend sessions and complete the program meeting her personal goals.   Patient will continue to attend sessions and complete the program meeting her personal goals.         Core Components/Risk Factors/Patient Goals at Discharge (Final Review):  Goals and Risk Factor Review - 12/16/17 0741      Core Components/Risk Factors/Patient Goals Review   Personal Goals Review  Weight Management/Obesity;Improve shortness of breath with ADL's;Develop more efficient breathing techniques such as purse lipped breathing and diaphragmatic breathing and practicing self-pacing with activity.   More energy; breathe better; get back to swimming; sing again.    Review  Patient has completed 15 sessions maintaining her weight. She has only attended 2 sessions in this 30 day review due to pain in her back and shoulders. She was evaluated by her pcp who says she pulled a muscle. She continues to do well in the program and will hopefully be able to be more consistent in her attendance. She hopes to get back to her classes at the [facility]. Will continue to monitor for progress.     Expected Outcomes  Patient will continue to attend sessions and complete the program meeting her personal goals.        ITP Comments: ITP Comments    Row Name 10/06/17 1353 10/07/17 1504         ITP Comments  Patient new to program. She has completed 2 sessions. Will continue to monitor for progress.   Patient attended the Family Matters class with hospital chaplian to discuss how this event has impacted their life.          Comments: ITP REVIEW Pt is making expected progress toward pulmonary rehab goals after completing  15 sessions. Recommend continued exercise, life style modification, education,  and utilization of breathing techniques to increase stamina and strength and decrease shortness of breath with exertion.

## 2017-12-16 NOTE — Progress Notes (Signed)
Daily Session Note  Patient Details  Name: AZZURE GARABEDIAN MRN: [id number] Date of Birth: January 27, 1941 Referring Provider:     PULMONARY REHAB OTHER RESPIRATORY from 10/01/2017 in [place]  Referring Provider  Dr. Annamaria Boots      Encounter Date: 12/16/2017  Check In: Session Check In - 12/16/17 1330      Check-In   Supervising physician immediately available to respond to emergencies  See telemetry face sheet for immediately available MD    Location  [facility]    Staff Present  Russella Dar, MS, EP, [facility], Exercise Physiologist;Lamesha Tibbits Wynetta Emery, RN, BSN    Medication changes reported      No    Fall or balance concerns reported     No    Warm-up and Cool-down  Performed as group-led instruction    Resistance Training Performed  Yes    VAD Patient?  No    PAD/SET Patient?  No      Pain Assessment   Currently in Pain?  No/denies    Pain Score  0-No pain    Multiple Pain Sites  No       Capillary Blood Glucose: No results found for this or any previous visit (from the past 24 hour(s)).    Social History   Tobacco Use  Smoking Status Never Smoker  Smokeless Tobacco Never Used    Goals Met:  Proper associated with RPD/PD & O2 Sat Independence with exercise equipment Improved SOB with ADL's Using PLB without cueing & demonstrates good technique Exercise tolerated well No report of cardiac concerns or symptoms Strength training completed today  Goals Unmet:  Not Applicable  Comments: Pt able to follow exercise prescription today without complaint.  Will continue to monitor for progression. Check out 1430.   Dr. Sinda Du is Medical Director for [facility] Pulmonary Rehab.

## 2017-12-17 NOTE — Telephone Encounter (Signed)
Joellen Jersey can you provide an update on this matter? Thanks.

## 2017-12-20 DIAGNOSIS — M542 Cervicalgia: Secondary | ICD-10-CM | POA: Diagnosis not present

## 2017-12-20 DIAGNOSIS — M9901 Segmental and somatic dysfunction of cervical region: Secondary | ICD-10-CM | POA: Diagnosis not present

## 2017-12-20 DIAGNOSIS — M545 Low back pain: Secondary | ICD-10-CM | POA: Diagnosis not present

## 2017-12-20 DIAGNOSIS — Z6836 Body mass index (BMI) 36.0-36.9, adult: Secondary | ICD-10-CM | POA: Diagnosis not present

## 2017-12-20 DIAGNOSIS — M9902 Segmental and somatic dysfunction of thoracic region: Secondary | ICD-10-CM | POA: Diagnosis not present

## 2017-12-20 DIAGNOSIS — M546 Pain in thoracic spine: Secondary | ICD-10-CM | POA: Diagnosis not present

## 2017-12-20 DIAGNOSIS — Z299 Encounter for prophylactic measures, unspecified: Secondary | ICD-10-CM | POA: Diagnosis not present

## 2017-12-20 DIAGNOSIS — R21 Rash and other nonspecific skin eruption: Secondary | ICD-10-CM | POA: Diagnosis not present

## 2017-12-20 DIAGNOSIS — I10 Essential (primary) hypertension: Secondary | ICD-10-CM | POA: Diagnosis not present

## 2017-12-20 DIAGNOSIS — M9905 Segmental and somatic dysfunction of pelvic region: Secondary | ICD-10-CM | POA: Diagnosis not present

## 2017-12-20 DIAGNOSIS — J449 Chronic obstructive pulmonary disease, unspecified: Secondary | ICD-10-CM | POA: Diagnosis not present

## 2017-12-20 DIAGNOSIS — I4891 Unspecified atrial fibrillation: Secondary | ICD-10-CM | POA: Diagnosis not present

## 2017-12-20 DIAGNOSIS — M9903 Segmental and somatic dysfunction of lumbar region: Secondary | ICD-10-CM | POA: Diagnosis not present

## 2017-12-21 ENCOUNTER — Encounter (HOSPITAL_COMMUNITY)
Admission: RE | Admit: 2017-12-21 | Discharge: 2017-12-21 | Disposition: A | Discharge status: Home / Self Care | Payer: Medicare Other | Source: Ambulatory Visit | Attending: Internal Medicine | Admitting: Internal Medicine

## 2017-12-21 DIAGNOSIS — I480 Paroxysmal atrial fibrillation: Secondary | ICD-10-CM | POA: Diagnosis not present

## 2017-12-21 DIAGNOSIS — G4733 Obstructive sleep apnea (adult) (pediatric): Secondary | ICD-10-CM | POA: Diagnosis not present

## 2017-12-21 DIAGNOSIS — F329 Major depressive disorder, single episode, unspecified: Secondary | ICD-10-CM | POA: Diagnosis not present

## 2017-12-21 DIAGNOSIS — J4550 Severe persistent asthma, uncomplicated: Secondary | ICD-10-CM | POA: Diagnosis not present

## 2017-12-21 DIAGNOSIS — I4892 Unspecified atrial flutter: Secondary | ICD-10-CM | POA: Diagnosis not present

## 2017-12-21 DIAGNOSIS — F419 Anxiety disorder, unspecified: Secondary | ICD-10-CM | POA: Diagnosis not present

## 2017-12-21 NOTE — Progress Notes (Signed)
Daily Session Note  Patient Details  Name: [name] [name] MRN: [id number] Date of Birth: 1941-02-12 Referring Provider:     PULMONARY REHAB OTHER RESPIRATORY from 10/01/2017 in [place]  Referring Provider  Dr. Annamaria Boots      Encounter Date: 12/21/2017  Check In: Session Check In - 12/21/17 1330      Check-In   Supervising physician immediately available to respond to emergencies  See telemetry face sheet for immediately available MD    Location  [facility]    Staff Present  Russella Dar, MS, EP, [facility], Exercise Physiologist;Kelsha Older Zachery Conch, Exercise Physiologist;Other    Medication changes reported      No    Fall or balance concerns reported     No    Warm-up and Cool-down  Performed as group-led instruction    Resistance Training Performed  Yes    VAD Patient?  No    PAD/SET Patient?  No      Pain Assessment   Currently in Pain?  No/denies    Pain Score  0-No pain    Multiple Pain Sites  No       Capillary Blood Glucose: No results found for this or any previous visit (from the past 24 hour(s)).    Social History   Tobacco Use  Smoking Status Never Smoker  Smokeless Tobacco Never Used    Goals Met:  Proper associated with RPD/PD & O2 Sat Using PLB without cueing & demonstrates good technique Exercise tolerated well No report of cardiac concerns or symptoms Strength training completed today  Goals Unmet:  Not Applicable  Comments: Pt able to follow exercise prescription today without complaint.  Will continue to monitor for progression. Check out 2:30.   Dr. Sinda Du is Medical Director for [facility] Pulmonary Rehab.

## 2017-12-21 NOTE — Telephone Encounter (Signed)
Spoke with [employer]-they continues to state they have no record of patient on file. They had to do multiple searches to find records, Rec'd paperwork and states patient was missing spend down paperwork and Medicare Part D. I have faxed requested information again. Will hold at my desk to seek approval/denial by Friday.

## 2017-12-22 ENCOUNTER — Telehealth: Payer: Self-pay | Admitting: Internal Medicine

## 2017-12-22 DIAGNOSIS — Z961 Presence of intraocular lens: Secondary | ICD-10-CM | POA: Diagnosis not present

## 2017-12-22 NOTE — Telephone Encounter (Signed)
1 vial Arrival Date:12/22/17 Lot #: [id number]

## 2017-12-23 ENCOUNTER — Ambulatory Visit: Payer: Medicare Other | Admitting: Cardiology

## 2017-12-23 ENCOUNTER — Encounter (HOSPITAL_COMMUNITY)
Admission: RE | Admit: 2017-12-23 | Discharge: 2017-12-23 | Disposition: A | Discharge status: Home / Self Care | Payer: Medicare Other | Source: Ambulatory Visit | Attending: Internal Medicine | Admitting: Internal Medicine

## 2017-12-23 DIAGNOSIS — J4550 Severe persistent asthma, uncomplicated: Secondary | ICD-10-CM | POA: Diagnosis not present

## 2017-12-23 DIAGNOSIS — I480 Paroxysmal atrial fibrillation: Secondary | ICD-10-CM | POA: Diagnosis not present

## 2017-12-23 DIAGNOSIS — G4733 Obstructive sleep apnea (adult) (pediatric): Secondary | ICD-10-CM | POA: Diagnosis not present

## 2017-12-23 DIAGNOSIS — F329 Major depressive disorder, single episode, unspecified: Secondary | ICD-10-CM | POA: Diagnosis not present

## 2017-12-23 DIAGNOSIS — I4892 Unspecified atrial flutter: Secondary | ICD-10-CM | POA: Diagnosis not present

## 2017-12-23 DIAGNOSIS — F419 Anxiety disorder, unspecified: Secondary | ICD-10-CM | POA: Diagnosis not present

## 2017-12-23 NOTE — Progress Notes (Signed)
Daily Session Note  Patient Details  Name: TANNAH DREYFUSS MRN: [id number] Date of Birth: Jul 24, 1940 Referring Provider:     PULMONARY REHAB OTHER RESPIRATORY from 10/01/2017 in [place]  Referring Provider  Dr. Annamaria Boots      Encounter Date: 12/23/2017  Check In: Session Check In - 12/23/17 1330      Check-In   Supervising physician immediately available to respond to emergencies  See telemetry face sheet for immediately available MD    Location  [facility]    Staff Present  Russella Dar, MS, EP, [facility], Exercise Physiologist;Amanda Ballard, Exercise Physiologist;Curry Dulski Wynetta Emery, RN, BSN    Medication changes reported      No    Fall or balance concerns reported     No    Warm-up and Cool-down  Performed as group-led Higher education careers adviser Performed  Yes    VAD Patient?  No    PAD/SET Patient?  No      Pain Assessment   Currently in Pain?  No/denies    Pain Score  0-No pain    Multiple Pain Sites  No       Capillary Blood Glucose: No results found for this or any previous visit (from the past 24 hour(s)).    Social History   Tobacco Use  Smoking Status Never Smoker  Smokeless Tobacco Never Used    Goals Met:  Proper associated with RPD/PD & O2 Sat Independence with exercise equipment Improved SOB with ADL's Using PLB without cueing & demonstrates good technique Exercise tolerated well No report of cardiac concerns or symptoms Strength training completed today  Goals Unmet:  Not Applicable  Comments: Pt able to follow exercise prescription today without complaint.  Will continue to monitor for progression. Check out 1430.   Dr. Sinda Du is Medical Director for [facility] Pulmonary Rehab.

## 2017-12-24 NOTE — Progress Notes (Signed)
Cardiology Office Note  Date: 12/27/2017   ID: [name] [name], DOB May 26, 1940, MRN [id number]  PCP: Monico Blitz, MD  Primary Cardiologist: Rozann Lesches, MD   Chief Complaint  Patient presents with  . Atrial Fibrillation    History of Present Illness: [name] [name] is a 77 y.o. female last seen in March.  She presents for a routine follow-up visit.  We went over her home vital sign checks going back through July.  She has had brief but fewer episodes of atrial fibrillation based on recorded heart rates.  She feels better overall.  Also enjoying pulmonary rehabilitation, feels like her stamina is improving.  Still has chronic dyspnea on exertion.  She is following with Dr. Rayann Heman and in the atrial fibrillation clinic with history of persistent atrial fibrillation as well as atrial flutter, previously on flecainide and transitioned to Tikosyn with reasonable symptom control.  She is due for follow-up BMET, magnesium, and ECG.  I reviewed her cardiac medications which are outlined below.  She reports compliance.  No bleeding problems on Coumadin.  I personally reviewed her ECG today which shows sinus bradycardia with PAC, QTc 462 ms.  Past Medical History:  Diagnosis Date  . Allergic rhinitis   . Anxiety   . Asthma   . Atrial flutter ([place])   . COPD (chronic obstructive pulmonary disease) ([place])   . Depression   . Essential hypertension   . Hyperlipidemia   . Lumbar disc disease   . Obesity   . OSA (obstructive sleep apnea)    CPAP  . Paroxysmal atrial fibrillation (HCC)    Element of tachycardia bradycardia syndrome  . Respiratory failure [facility])     Past Surgical History:  Procedure Laterality Date  . ANTERIOR FUSION LUMBAR SPINE    . BIOPSY  08/27/2016   Procedure: BIOPSY;  Surgeon: Rogene Houston, MD;  Location: [facility];  Service: Endoscopy;;  FOUR GASTRIC POLYPS BIOPSIED  . CARDIOVERSION N/A 03/28/2014   Procedure: CARDIOVERSION;  Surgeon: Fay Records, MD;  Location: [facility] ORS;  Service: Cardiovascular;  Laterality: N/A;  . CARDIOVERSION N/A 10/22/2016   Procedure: CARDIOVERSION;  Surgeon: Sueanne Margarita, MD;  Location: [facility] ENDOSCOPY;  Service: Cardiovascular;  Laterality: N/A;  . CATARACT EXTRACTION    . COLONOSCOPY N/A 08/04/2012   Procedure: COLONOSCOPY;  Surgeon: Rogene Houston, MD;  [facility];  Service: Endoscopy;  Laterality: N/A;  730-rescheduled to [place] notified pt  . ELECTROPHYSIOLOGIC STUDY N/A 09/18/2014   Procedure: Atrial Fibrillation Ablation;  Surgeon: Thompson Grayer, MD;  Location: [place] CV LAB;  Service: Cardiovascular;  Laterality: N/A;  . ESOPHAGOGASTRODUODENOSCOPY N/A 08/27/2016   Procedure: ESOPHAGOGASTRODUODENOSCOPY (EGD);  Surgeon: Rogene Houston, MD;  [facility];  Service: Endoscopy;  Laterality: N/A;  1200  . LASIK     Both eyes  . TEE WITHOUT CARDIOVERSION N/A 09/18/2014   Procedure: TRANSESOPHAGEAL ECHOCARDIOGRAM (TEE);  Surgeon: Larey Dresser, MD;  Location: [place];  Service: Cardiovascular;  Laterality: N/A;  . TEE WITHOUT CARDIOVERSION N/A 10/22/2016   Procedure: TRANSESOPHAGEAL ECHOCARDIOGRAM (TEE);  Surgeon: Sueanne Margarita, MD;  Location: [facility] ENDOSCOPY;  Service: Cardiovascular;  Laterality: N/A;  . TEE WITHOUT CARDIOVERSION N/A 08/24/2017   Procedure: TRANSESOPHAGEAL ECHOCARDIOGRAM (TEE);  Surgeon: Josue Hector, MD;  Location: [facility] ENDOSCOPY;  Service: Cardiovascular;  Laterality: N/A;  . TOTAL HIP ARTHROPLASTY  2010   Right    Current Outpatient Medications  Medication Sig Dispense Refill  .  acetaminophen (TYLENOL) 500 MG tablet Take 500 mg by mouth daily as needed for headache.    Marland Kitchen antiseptic oral rinse (BIOTENE) LIQD 15 mLs by Mouth Rinse route as needed for dry mouth.    Marland Kitchen atorvastatin (LIPITOR) 10 MG tablet Take 10 mg by mouth every evening.     . baclofen (LIORESAL) 10 MG tablet Take 10 mg by mouth as needed for muscle spasms.    . benzonatate (TESSALON)  200 MG capsule Take 1 capsule (200 mg total) by mouth 3 (three) times daily as needed for cough. 30 capsule 3  . Biotin 5000 MCG CAPS Take 5,000 mcg by mouth daily.    . budesonide (PULMICORT) 0.5 MG/2ML nebulizer solution Take 0.5 mg by nebulization 2 (two) times daily.     . cetirizine (ZYRTEC) 10 MG tablet Take 10 mg by mouth daily as needed for allergies.    . clindamycin (CLEOCIN) 300 MG capsule 2 capsules as needed. Take prior to dental procedures  0  . cycloSPORINE (RESTASIS) 0.05 % ophthalmic emulsion Place 1 drop 2 (two) times daily into both eyes.     Marland Kitchen diltiazem (CARTIA XT) 120 MG 24 hr capsule Take 120 mg by mouth at bedtime.     . dofetilide (TIKOSYN) 500 MCG capsule Take 1 capsule (500 mcg total) by mouth 2 (two) times daily. 60 capsule 6  . famotidine (PEPCID) 20 MG tablet Take 1 tablet (20 mg total) by mouth at bedtime. 30 tablet 2  . fluticasone (FLONASE) 50 MCG/ACT nasal spray Place 1 spray into both nostrils 2 (two) times daily.    Marland Kitchen HYDROcodone-homatropine (HYDROMET) 5-1.5 MG/5ML syrup Take 5 mLs by mouth every 6 (six) hours as needed for cough. 120 mL 0  . Hypromellose (ARTIFICIAL TEARS OP) Apply 1 drop to eye every 4 (four) hours as needed (dry eyes).     Marland Kitchen ipratropium (ATROVENT) 0.06 % nasal spray Place 2 sprays into both nostrils 4 (four) times daily as needed for rhinitis.     Marland Kitchen ipratropium-albuterol (DUONEB) 0.5-2.5 (3) MG/3ML SOLN Take 3 mLs by nebulization 2 (two) times daily.     . montelukast (SINGULAIR) 10 MG tablet Take 10 mg by mouth at bedtime.    Marland Kitchen omeprazole (PRILOSEC) 20 MG capsule Take 20 mg by mouth daily.    . potassium chloride SA (K-DUR,KLOR-CON) 20 MEQ tablet Take 1 tablet (20 mEq total) by mouth daily. 30 tablet 6  . sodium chloride (OCEAN) 0.65 % SOLN nasal spray Place 1 spray into both nostrils 2 (two) times daily.     Marland Kitchen triamcinolone ointment (KENALOG) 0.1 % Apply 1 application topically daily as needed for rash.  2  . umeclidinium-vilanterol (ANORO  ELLIPTA) 62.5-25 MCG/INH AEPB Inhale 1 puff into the lungs daily. 3 each 3  . WARFARIN SODIUM PO Take by mouth as directed.    . Wheat Dextrin (BENEFIBER ON THE GO) POWD Take 1 Dose by mouth daily as needed (constipation).     Marland Kitchen losartan (COZAAR) 100 MG tablet Take 1 tablet (100 mg total) by mouth daily. 90 tablet 2   Current Facility-Administered Medications  Medication Dose Route Frequency Provider Last Rate Last Dose  . Mepolizumab SOLR 100 mg  100 mg Subcutaneous Q28 days Baird Lyons D, MD   100 mg at 08/20/17 0850  . Mepolizumab SOLR 100 mg  100 mg Subcutaneous Q28 days Baird Lyons D, MD   100 mg at 11/03/17 1037   Allergies:  Bupropion hcl; Escitalopram oxalate; Fluticasone-salmeterol; Lisinopril; Serevent;  Macrodantin [nitrofurantoin]; and Penicillins   Social History: The patient  reports that she has never smoked. She has never used smokeless tobacco. She reports that she does not drink alcohol or use drugs.   ROS:  Please see the history of present illness. Otherwise, complete review of systems is positive for chronic shortness of breath.  All other systems are reviewed and negative.   Physical Exam: VS:  BP 130/60   Pulse (!) 50   Ht 5' 3.5" (1.613 m)   Wt 201 lb (91.2 kg)   SpO2 98%   BMI 35.05 kg/m , BMI Body mass index is 35.05 kg/m.  Wt Readings from Last 3 Encounters:  12/27/17 201 lb (91.2 kg)  11/17/17 200 lb 3.2 oz (90.8 kg)  10/28/17 199 lb 9.6 oz (90.5 kg)    General: Patient appears comfortable at rest. HEENT: Conjunctiva and lids normal, oropharynx clear. Neck: Supple, no elevated JVP or carotid bruits, no thyromegaly. Lungs: Diminished breath sounds but no wheezing, nonlabored breathing at rest. Cardiac: Regular rate and rhythm, no S3 or significant systolic murmur, no pericardial rub. Abdomen: Obese, nontender, bowel sounds present. Extremities: No pitting edema, distal pulses 2+. Skin: Warm and dry. Musculoskeletal: No  kyphosis. Neuropsychiatric: Alert and oriented x3, affect grossly appropriate.  ECG: I personally reviewed the tracing from 11/17/2017 which showed sinus rhythm with PVC, poor R wave progression and left anterior fascicular block, QTc 478.  Recent Labwork: 09/01/2017: Magnesium 1.8 09/20/2017: ALT 16; AST 18; BUN 19; Creatinine, Ser 0.91; Hemoglobin 11.2; Platelets 219.0; Potassium 3.6; Pro B Natriuretic peptide (BNP) 220.0; Sodium 141   Other Studies Reviewed Today:  TEE 08/24/2017: Study Conclusions  - Left ventricle: Systolic function was normal. The estimated   ejection fraction was in the range of 55% to 60%. - Mitral valve: There was mild regurgitation. - Left atrium: The atrium was dilated. No evidence of thrombus in   the atrial cavity or appendage. No evidence of thrombus in the   appendage. - Right atrium: No evidence of thrombus in the atrial cavity or   appendage. - Atrial septum: No defect or patent foramen ovale was identified. - Tricuspid valve: There was mild-moderate regurgitation. - Impressions: Patient had TEE prior to Tikosyn load to r/o LAA   thrombus   No thrombus present     Moderate sedation : 30 minutes 5 mg versed 50 ug of fentanyl     3D rendering of atrial septum and mitral valve performed  Impressions:  - Patient had TEE prior to Tikosyn load to r/o LAA thrombus   No thrombus present     Moderate sedation : 30 minutes 5 mg versed 50 ug of fentanyl     3D rendering of atrial septum and mitral valve performed No   cardiac source of emboli was indentified.  Assessment and Plan:  1.  Persistent atrial fibrillation and flutter.  She is doing relatively well with good rhythm suppression on Tikosyn, otherwise continues on Cartia XT and Coumadin.  Follow-up ECG reviewed today, will also obtain BMET and magnesium.  2.  COPD, continues to follow with Dr. Annamaria Boots.  She is enjoying pulmonary rehabilitation at [facility].  3.  OSA on CPAP.  4.  Sick sinus  syndrome with bradycardia, overall stable.  Current medicines were reviewed with the patient today.   Orders Placed This Encounter  Procedures  . Basic metabolic panel  . Magnesium  . EKG 12-Lead    Disposition: Follow-up in 6 months.  Signed, Mikeal Hawthorne  Delman Kitten, MD, [facility] 12/27/2017 9:12 AM    [facility] at [place], [place], [place] [address] Phone: [phone]; Fax: [phone]

## 2017-12-27 ENCOUNTER — Telehealth: Payer: Self-pay | Admitting: Internal Medicine

## 2017-12-27 ENCOUNTER — Ambulatory Visit (INDEPENDENT_AMBULATORY_CARE_PROVIDER_SITE_OTHER): Payer: Medicare Other | Admitting: Cardiology

## 2017-12-27 ENCOUNTER — Encounter: Payer: Self-pay | Admitting: Cardiology

## 2017-12-27 ENCOUNTER — Encounter

## 2017-12-27 VITALS — BP 130/60 | HR 50 | Ht 63.5 in | Wt 201.0 lb

## 2017-12-27 DIAGNOSIS — Z5181 Encounter for therapeutic drug level monitoring: Secondary | ICD-10-CM

## 2017-12-27 DIAGNOSIS — Z79899 Other long term (current) drug therapy: Secondary | ICD-10-CM

## 2017-12-27 DIAGNOSIS — I481 Persistent atrial fibrillation: Secondary | ICD-10-CM | POA: Diagnosis not present

## 2017-12-27 DIAGNOSIS — J449 Chronic obstructive pulmonary disease, unspecified: Secondary | ICD-10-CM

## 2017-12-27 DIAGNOSIS — I4819 Other persistent atrial fibrillation: Secondary | ICD-10-CM

## 2017-12-27 DIAGNOSIS — G4733 Obstructive sleep apnea (adult) (pediatric): Secondary | ICD-10-CM | POA: Diagnosis not present

## 2017-12-27 DIAGNOSIS — I495 Sick sinus syndrome: Secondary | ICD-10-CM | POA: Diagnosis not present

## 2017-12-27 MED ORDER — UMECLIDINIUM-VILANTEROL 62.5-25 MCG/INH IN AEPB: 1.0000 | INHALATION_SPRAY | Freq: Every day | RESPIRATORY_TRACT | 0 refills | Status: DC

## 2017-12-27 NOTE — Patient Instructions (Signed)
Medication Instructions:  Continue all current medications.  Labwork:  BMET, Magnesium - orders given today.   Office will contact with results via phone or letter.    Testing/Procedures: none  Follow-Up: Your physician wants you to follow up in: 6 months.  You will receive a reminder letter in the mail one-two months in advance.  If you don't receive a letter, please call our office to schedule the follow up appointment   Any Other Special Instructions Will Be Listed Below (If Applicable).  If you need a refill on your cardiac medications before your next appointment, please call your pharmacy.  

## 2017-12-27 NOTE — Telephone Encounter (Signed)
Spoke with [facility] and they stated they did not receive the information for the medicare card and proof of expense of 600 dollars. Katie should we fax again because I know it takes time for them to update information in their system once they receive it.   I called pt to let her know that [facility] is still processing information. She requested samples until the decision has been made. I left samples up front for her to pick up.

## 2017-12-28 ENCOUNTER — Other Ambulatory Visit (HOSPITAL_COMMUNITY)
Admission: RE | Admit: 2017-12-28 | Discharge: 2017-12-28 | Disposition: A | Discharge status: Home / Self Care | Payer: Medicare Other | Source: Ambulatory Visit | Attending: Cardiology | Admitting: Cardiology

## 2017-12-28 ENCOUNTER — Encounter (HOSPITAL_COMMUNITY)
Admission: RE | Admit: 2017-12-28 | Discharge: 2017-12-28 | Disposition: A | Discharge status: Home / Self Care | Payer: Medicare Other | Source: Ambulatory Visit | Attending: Internal Medicine | Admitting: Internal Medicine

## 2017-12-28 DIAGNOSIS — J4550 Severe persistent asthma, uncomplicated: Secondary | ICD-10-CM | POA: Insufficient documentation

## 2017-12-28 DIAGNOSIS — F329 Major depressive disorder, single episode, unspecified: Secondary | ICD-10-CM | POA: Insufficient documentation

## 2017-12-28 DIAGNOSIS — Z79899 Other long term (current) drug therapy: Secondary | ICD-10-CM | POA: Insufficient documentation

## 2017-12-28 DIAGNOSIS — J449 Chronic obstructive pulmonary disease, unspecified: Secondary | ICD-10-CM | POA: Diagnosis not present

## 2017-12-28 DIAGNOSIS — Z7901 Long term (current) use of anticoagulants: Secondary | ICD-10-CM | POA: Diagnosis not present

## 2017-12-28 DIAGNOSIS — I10 Essential (primary) hypertension: Secondary | ICD-10-CM | POA: Insufficient documentation

## 2017-12-28 DIAGNOSIS — E669 Obesity, unspecified: Secondary | ICD-10-CM | POA: Insufficient documentation

## 2017-12-28 DIAGNOSIS — G4733 Obstructive sleep apnea (adult) (pediatric): Secondary | ICD-10-CM | POA: Diagnosis not present

## 2017-12-28 DIAGNOSIS — I480 Paroxysmal atrial fibrillation: Secondary | ICD-10-CM | POA: Diagnosis not present

## 2017-12-28 DIAGNOSIS — F419 Anxiety disorder, unspecified: Secondary | ICD-10-CM | POA: Insufficient documentation

## 2017-12-28 DIAGNOSIS — I4892 Unspecified atrial flutter: Secondary | ICD-10-CM | POA: Insufficient documentation

## 2017-12-28 DIAGNOSIS — I495 Sick sinus syndrome: Secondary | ICD-10-CM | POA: Diagnosis not present

## 2017-12-28 DIAGNOSIS — E785 Hyperlipidemia, unspecified: Secondary | ICD-10-CM | POA: Insufficient documentation

## 2017-12-28 LAB — BASIC METABOLIC PANEL
Anion gap: 9 (ref 5–15)
BUN: 22 mg/dL (ref 8–23)
CALCIUM: 9 mg/dL (ref 8.9–10.3)
CO2: 23 mmol/L (ref 22–32)
CREATININE: 0.85 mg/dL (ref 0.44–1.00)
Chloride: 106 mmol/L (ref 98–111)
GFR calc Af Amer: >60 mL/min (ref >60)
GLUCOSE: 106 mg/dL — AB (ref 70–99)
Potassium: 3.9 mmol/L (ref 3.5–5.1)
SODIUM: 138 mmol/L (ref 135–145)

## 2017-12-28 LAB — MAGNESIUM: MAGNESIUM: 2 mg/dL (ref 1.7–2.4)

## 2017-12-28 NOTE — Telephone Encounter (Signed)
Katie have you received anything on this pt's patient assistance application? Thanks.

## 2017-12-28 NOTE — Telephone Encounter (Signed)
Jannette Spanner, CMA 22 hours ago (11:37 AM)      Spoke with [facility] and they stated they did not receive the information for the medicare card and proof of expense of 600 dollars. Katie should we fax again because I know it takes time for them to update information in their system once they receive it.   I called pt to let her know that [facility] is still processing information. She requested samples until the decision has been made. I left samples up front for her to pick up.        I have refaxed paperwork again for the 3rd time.

## 2017-12-28 NOTE — Progress Notes (Signed)
Daily Session Note  Patient Details  Name: DAMARIS ABELN MRN: [id number] Date of Birth: 1941-02-23 Referring Provider:     PULMONARY REHAB OTHER RESPIRATORY from 10/01/2017 in [place]  Referring Provider  Dr. Annamaria Boots      Encounter Date: 12/28/2017  Check In: Session Check In - 12/28/17 1330      Check-In   Supervising physician immediately available to respond to emergencies  See telemetry face sheet for immediately available MD    Location  [facility]    Staff Present  Benay Pike, Exercise Physiologist;Diane Coad, MS, EP, [facility], Exercise Physiologist    Medication changes reported      No    Fall or balance concerns reported     No    Warm-up and Cool-down  Performed as group-led instruction    Resistance Training Performed  Yes    VAD Patient?  No    PAD/SET Patient?  No      Pain Assessment   Currently in Pain?  No/denies    Pain Score  0-No pain    Multiple Pain Sites  No       Capillary Blood Glucose: No results found for this or any previous visit (from the past 24 hour(s)).    Social History   Tobacco Use  Smoking Status Never Smoker  Smokeless Tobacco Never Used    Goals Met:  Proper associated with RPD/PD & O2 Sat Independence with exercise equipment Using PLB without cueing & demonstrates good technique Exercise tolerated well No report of cardiac concerns or symptoms Strength training completed today  Goals Unmet:  Not Applicable  Comments: Pt able to follow exercise prescription today without complaint.  Will continue to monitor for progression. Check out 2:30.   Dr. Sinda Du is Medical Director for [facility] Pulmonary Rehab.

## 2017-12-29 ENCOUNTER — Ambulatory Visit (INDEPENDENT_AMBULATORY_CARE_PROVIDER_SITE_OTHER): Payer: Medicare Other

## 2017-12-29 ENCOUNTER — Ambulatory Visit: Payer: Medicare Other

## 2017-12-29 DIAGNOSIS — J4550 Severe persistent asthma, uncomplicated: Secondary | ICD-10-CM | POA: Diagnosis not present

## 2017-12-29 MED ORDER — MEPOLIZUMAB 100 MG ~~LOC~~ SOLR
100.0000 mg | Freq: Once | SUBCUTANEOUS | Status: AC
  Administered 2017-12-29: 100 mg via SUBCUTANEOUS

## 2017-12-29 NOTE — Telephone Encounter (Signed)
Called [employer]. Paper work is processing and they should have a decision in 24-28 hours.

## 2017-12-30 ENCOUNTER — Encounter (HOSPITAL_COMMUNITY)
Admission: RE | Admit: 2017-12-30 | Discharge: 2017-12-30 | Disposition: A | Discharge status: Home / Self Care | Payer: Medicare Other | Source: Ambulatory Visit | Attending: Internal Medicine | Admitting: Internal Medicine

## 2017-12-30 DIAGNOSIS — F329 Major depressive disorder, single episode, unspecified: Secondary | ICD-10-CM | POA: Diagnosis not present

## 2017-12-30 DIAGNOSIS — F419 Anxiety disorder, unspecified: Secondary | ICD-10-CM | POA: Diagnosis not present

## 2017-12-30 DIAGNOSIS — J4550 Severe persistent asthma, uncomplicated: Secondary | ICD-10-CM | POA: Diagnosis not present

## 2017-12-30 DIAGNOSIS — I480 Paroxysmal atrial fibrillation: Secondary | ICD-10-CM | POA: Diagnosis not present

## 2017-12-30 DIAGNOSIS — I4892 Unspecified atrial flutter: Secondary | ICD-10-CM | POA: Diagnosis not present

## 2017-12-30 DIAGNOSIS — G4733 Obstructive sleep apnea (adult) (pediatric): Secondary | ICD-10-CM | POA: Diagnosis not present

## 2017-12-30 NOTE — Progress Notes (Signed)
Daily Session Note  Patient Details  Name: [name] [name] MRN: [id number] Date of Birth: [birth year] Referring Provider:     PULMONARY REHAB OTHER RESPIRATORY from 10/01/2017 in [place]  Referring Provider  Dr. Annamaria Boots      Encounter Date: 12/30/2017  Check In: Session Check In - 12/30/17 1330      Check-In   Supervising physician immediately available to respond to emergencies  See telemetry face sheet for immediately available MD    Location  [facility]    Staff Present  Benay Pike, Exercise Physiologist;Kelci Petrella Wynetta Emery, RN, BSN    Medication changes reported      No    Fall or balance concerns reported     No    Warm-up and Cool-down  Performed as group-led instruction    Resistance Training Performed  Yes    VAD Patient?  No    PAD/SET Patient?  No      Pain Assessment   Currently in Pain?  No/denies    Pain Score  0-No pain    Multiple Pain Sites  No       Capillary Blood Glucose: No results found for this or any previous visit (from the past 24 hour(s)).    Social History   Tobacco Use  Smoking Status Never Smoker  Smokeless Tobacco Never Used    Goals Met:  Proper associated with RPD/PD & O2 Sat Independence with exercise equipment Improved SOB with ADL's Using PLB without cueing & demonstrates good technique Exercise tolerated well No report of cardiac concerns or symptoms Strength training completed today  Goals Unmet:  Not Applicable  Comments: Pt able to follow exercise prescription today without complaint.  Will continue to monitor for progression. Check out 1430.   Dr. Sinda Du is Medical Director for [facility] Pulmonary Rehab.

## 2017-12-30 NOTE — Telephone Encounter (Signed)
Called [employer] to check status-they need to call to verify that patient has indeed spent $600 out pf pocket as well as they need to have [place] re-fax Medicare Part D coverage card to [phone]; mark as URGENT. Will need to give until Monday 01-03-18 for updates.   All information has been faxed.

## 2018-01-03 DIAGNOSIS — Z23 Encounter for immunization: Secondary | ICD-10-CM | POA: Diagnosis not present

## 2018-01-03 DIAGNOSIS — Z299 Encounter for prophylactic measures, unspecified: Secondary | ICD-10-CM | POA: Diagnosis not present

## 2018-01-03 DIAGNOSIS — I4891 Unspecified atrial fibrillation: Secondary | ICD-10-CM | POA: Diagnosis not present

## 2018-01-03 DIAGNOSIS — I10 Essential (primary) hypertension: Secondary | ICD-10-CM | POA: Diagnosis not present

## 2018-01-03 DIAGNOSIS — G4733 Obstructive sleep apnea (adult) (pediatric): Secondary | ICD-10-CM | POA: Diagnosis not present

## 2018-01-03 DIAGNOSIS — Z6837 Body mass index (BMI) 37.0-37.9, adult: Secondary | ICD-10-CM | POA: Diagnosis not present

## 2018-01-03 NOTE — Telephone Encounter (Signed)
Called [employer], spoke with Estill Bamberg, application has been approved 01/03/2018-03/29/2018, a letter of approval will be sent to the patient making her aware. Patient will be eligible again next year after she has spent $600 out of pocket.Called and spoke to patient, made here aware update. Nothing further is needed at this time.

## 2018-01-04 ENCOUNTER — Encounter (HOSPITAL_COMMUNITY): Payer: Medicare Other

## 2018-01-06 ENCOUNTER — Telehealth: Payer: Self-pay | Admitting: Internal Medicine

## 2018-01-06 ENCOUNTER — Encounter (HOSPITAL_COMMUNITY)
Admission: RE | Admit: 2018-01-06 | Discharge: 2018-01-06 | Disposition: A | Discharge status: Home / Self Care | Payer: Medicare Other | Source: Ambulatory Visit | Attending: Internal Medicine | Admitting: Internal Medicine

## 2018-01-06 DIAGNOSIS — G4733 Obstructive sleep apnea (adult) (pediatric): Secondary | ICD-10-CM | POA: Diagnosis not present

## 2018-01-06 DIAGNOSIS — I480 Paroxysmal atrial fibrillation: Secondary | ICD-10-CM | POA: Diagnosis not present

## 2018-01-06 DIAGNOSIS — F419 Anxiety disorder, unspecified: Secondary | ICD-10-CM | POA: Diagnosis not present

## 2018-01-06 DIAGNOSIS — J4550 Severe persistent asthma, uncomplicated: Secondary | ICD-10-CM | POA: Diagnosis not present

## 2018-01-06 DIAGNOSIS — F329 Major depressive disorder, single episode, unspecified: Secondary | ICD-10-CM | POA: Diagnosis not present

## 2018-01-06 DIAGNOSIS — I4892 Unspecified atrial flutter: Secondary | ICD-10-CM | POA: Diagnosis not present

## 2018-01-06 NOTE — Telephone Encounter (Signed)
Pt is no longer on Trelegy per our documentation. [employer], they are aware of this information. Nothing further was needed.

## 2018-01-06 NOTE — Progress Notes (Signed)
Daily Session Note  Patient Details  Name: SEVYN MARKHAM MRN: [id number] Date of Birth: 10/10/1940 Referring Provider:     PULMONARY REHAB OTHER RESPIRATORY from 10/01/2017 in [place]  Referring Provider  Dr. Annamaria Boots      Encounter Date: 01/06/2018  Check In: Session Check In - 01/06/18 1330      Check-In   Supervising physician immediately available to respond to emergencies  See telemetry face sheet for immediately available MD    Location  [facility]    Staff Present  Benay Pike, Exercise Physiologist;Orvella Digiulio Wynetta Emery, RN, BSN    Medication changes reported      No    Fall or balance concerns reported     No    Warm-up and Cool-down  Performed as group-led instruction    Resistance Training Performed  Yes    VAD Patient?  No    PAD/SET Patient?  No      Pain Assessment   Currently in Pain?  No/denies    Pain Score  0-No pain    Multiple Pain Sites  No       Capillary Blood Glucose: No results found for this or any previous visit (from the past 24 hour(s)).    Social History   Tobacco Use  Smoking Status Never Smoker  Smokeless Tobacco Never Used    Goals Met:  Proper associated with RPD/PD & O2 Sat Independence with exercise equipment Improved SOB with ADL's Using PLB without cueing & demonstrates good technique Exercise tolerated well No report of cardiac concerns or symptoms Strength training completed today  Goals Unmet:  Not Applicable  Comments: Pt able to follow exercise prescription today without complaint.  Will continue to monitor for progression. Check out 1430.   Dr. Sinda Du is Medical Director for [facility] Pulmonary Rehab.

## 2018-01-06 NOTE — Progress Notes (Addendum)
Pulmonary Individual Treatment Plan  Patient Details  Name: [name] [name] MRN: [id number] Date of Birth: [birth year] Referring Provider:     PULMONARY REHAB OTHER RESPIRATORY from 10/01/2017 in [place]  Referring Provider  Dr. Annamaria Boots      Initial Encounter Date:    PULMONARY REHAB OTHER RESPIRATORY from 10/01/2017 in [facility]  Date  10/01/17      Visit Diagnosis: Asthma in adult, severe persistent, uncomplicated  Patient's Home Medications on Admission:   Current Outpatient Medications:  .  acetaminophen (TYLENOL) 500 MG tablet, Take 500 mg by mouth daily as needed for headache., Disp: , Rfl:  .  antiseptic oral rinse (BIOTENE) LIQD, 15 mLs by Mouth Rinse route as needed for dry mouth., Disp: , Rfl:  .  atorvastatin (LIPITOR) 10 MG tablet, Take 10 mg by mouth every evening. , Disp: , Rfl:  .  baclofen (LIORESAL) 10 MG tablet, Take 10 mg by mouth as needed for muscle spasms., Disp: , Rfl:  .  benzonatate (TESSALON) 200 MG capsule, Take 1 capsule (200 mg total) by mouth 3 (three) times daily as needed for cough., Disp: 30 capsule, Rfl: 3 .  Biotin 5000 MCG CAPS, Take 5,000 mcg by mouth daily., Disp: , Rfl:  .  budesonide (PULMICORT) 0.5 MG/2ML nebulizer solution, Take 0.5 mg by nebulization 2 (two) times daily. , Disp: , Rfl:  .  cetirizine (ZYRTEC) 10 MG tablet, Take 10 mg by mouth daily as needed for allergies., Disp: , Rfl:  .  clindamycin (CLEOCIN) 300 MG capsule, 2 capsules as needed. Take prior to dental procedures, Disp: , Rfl: 0 .  cycloSPORINE (RESTASIS) 0.05 % ophthalmic emulsion, Place 1 drop 2 (two) times daily into both eyes. , Disp: , Rfl:  .  diltiazem (CARTIA XT) 120 MG 24 hr capsule, Take 120 mg by mouth at bedtime. , Disp: , Rfl:  .  dofetilide (TIKOSYN) 500 MCG capsule, Take 1 capsule (500 mcg total) by mouth 2 (two) times daily., Disp: 60 capsule, Rfl: 6 .  famotidine (PEPCID) 20 MG tablet, Take 1 tablet (20 mg  total) by mouth at bedtime., Disp: 30 tablet, Rfl: 2 .  fluticasone (FLONASE) 50 MCG/ACT nasal spray, Place 1 spray into both nostrils 2 (two) times daily., Disp: , Rfl:  .  HYDROcodone-homatropine (HYDROMET) 5-1.5 MG/5ML syrup, Take 5 mLs by mouth every 6 (six) hours as needed for cough., Disp: 120 mL, Rfl: 0 .  Hypromellose (ARTIFICIAL TEARS OP), Apply 1 drop to eye every 4 (four) hours as needed (dry eyes). , Disp: , Rfl:  .  ipratropium (ATROVENT) 0.06 % nasal spray, Place 2 sprays into both nostrils 4 (four) times daily as needed for rhinitis. , Disp: , Rfl:  .  ipratropium-albuterol (DUONEB) 0.5-2.5 (3) MG/3ML SOLN, Take 3 mLs by nebulization 2 (two) times daily. , Disp: , Rfl:  .  losartan (COZAAR) 100 MG tablet, Take 1 tablet (100 mg total) by mouth daily., Disp: 90 tablet, Rfl: 2 .  montelukast (SINGULAIR) 10 MG tablet, Take 10 mg by mouth at bedtime., Disp: , Rfl:  .  omeprazole (PRILOSEC) 20 MG capsule, Take 20 mg by mouth daily., Disp: , Rfl:  .  potassium chloride SA (K-DUR,KLOR-CON) 20 MEQ tablet, Take 1 tablet (20 mEq total) by mouth daily., Disp: 30 tablet, Rfl: 6 .  sodium chloride (OCEAN) 0.65 % SOLN nasal spray, Place 1 spray into both nostrils 2 (two) times daily. , Disp: , Rfl:  .  triamcinolone ointment (KENALOG) 0.1 %, Apply 1 application topically daily as needed for rash., Disp: , Rfl: 2 .  umeclidinium-vilanterol (ANORO ELLIPTA) 62.5-25 MCG/INH AEPB, Inhale 1 puff into the lungs daily., Disp: 2 each, Rfl: 0 .  WARFARIN SODIUM PO, Take by mouth as directed., Disp: , Rfl:  .  Wheat Dextrin (BENEFIBER ON THE GO) POWD, Take 1 Dose by mouth daily as needed (constipation). , Disp: , Rfl:   Past Medical History: Past Medical History:  Diagnosis Date  . Allergic rhinitis   . Anxiety   . Asthma   . Atrial flutter ([place])   . COPD (chronic obstructive pulmonary disease) ([place])   . Depression   . Essential hypertension   . Hyperlipidemia   . Lumbar disc disease   . Obesity    . OSA (obstructive sleep apnea)    CPAP  . Paroxysmal atrial fibrillation (H[facility])    Element of tachycardia bradycardia syndrome  . Respiratory failure ([facility])     Tobacco Use: Social History   Tobacco Use  Smoking Status Never Smoker  Smokeless Tobacco Never Used    Labs: Recent Review Flowsheet Data    There is no flowsheet data to display.      Capillary Blood Glucose: No results found for: GLUCAP   Pulmonary Assessment Scores: Pulmonary Assessment Scores    Row Name 10/01/17 1124         ADL UCSD   ADL Phase  Entry     SOB Score total  46     Rest  0     Walk  9     Stairs  4     Bath  2     Dress  2     Shop  2       CAT Score   CAT Score  19       mMRC Score   mMRC Score  3        Pulmonary Function Assessment: Pulmonary Function Assessment - 10/01/17 1123      Pulmonary Function Tests   FVC%  79 %    FEV1%  78 %    FEV1/FVC Ratio  99    RV%  92 %    DLCO%  81 %      Initial Spirometry Results   FVC%  79 %    FEV1%  78 %    FEV1/FVC Ratio  99      Post Bronchodilator Spirometry Results   FVC%  77 %    FEV1%  83 %    FEV1/FVC Ratio  108      Breath   Bilateral Breath Sounds  Clear    Shortness of Breath  Yes       Exercise Target Goals: Exercise Program Goal: Individual exercise prescription set using results from initial 6 min walk test and THRR while considering  patient's activity barriers and safety.   Exercise Prescription Goal: Initial exercise prescription builds to 30-45 minutes a day of aerobic activity, 2-3 days per week.  Home exercise guidelines will be given to patient during program as part of exercise prescription that the participant will acknowledge.  Activity Barriers & Risk Stratification: Activity Barriers & Cardiac Risk Stratification - 10/01/17 1007      Activity Barriers & Cardiac Risk Stratification   Activity Barriers  Back Problems;Deconditioning;Shortness of Breath;Left Hip Replacement;Right Hip  Replacement;Other (comment)    Comments  Patient great toe and 2nd toes are crossed. has been told by her  doctor not to do activities that make her go up on her toes.     Cardiac Risk Stratification  Moderate       6 Minute Walk: 6 Minute Walk    Row Name 10/01/17 1006         6 Minute Walk   Phase  Initial     Distance  900 feet     Distance % Change  0 %     Distance Feet Change  0 ft     Walk Time  6 minutes     # of Rest Breaks  0     MPH  1.7     METS  2.3     RPE  13     Perceived Dyspnea   14     VO2 Peak  6.24     Symptoms  Yes (comment)     Comments  Patient had to push a wheelchair half way through walk test due to extreme shortness of breath     Resting HR  62 bpm     Resting BP  124/52     Resting Oxygen Saturation   97 %     Exercise Oxygen Saturation  during 6 min walk  98 %     Max Ex. HR  97 bpm     Max Ex. BP  200/62     2 Minute Post BP  154/60        Oxygen Initial Assessment: Oxygen Initial Assessment - 10/01/17 1127      Home Oxygen   Home Oxygen Device  None    Sleep Oxygen Prescription  None    Home Exercise Oxygen Prescription  None    Home at Rest Exercise Oxygen Prescription  None    Compliance with Home Oxygen Use  --   N/A     Initial 6 min Walk   Oxygen Used  None      Program Oxygen Prescription   Program Oxygen Prescription  None       Oxygen Re-Evaluation: Oxygen Re-Evaluation    Row Name 11/04/17 0742 11/24/17 1504 12/16/17 0740 01/06/18 0926       Program Oxygen Prescription   Program Oxygen Prescription  None  None  None  None      Home Oxygen   Home Oxygen Device  None  None  None  None    Sleep Oxygen Prescription  None  None  None  None    Home Exercise Oxygen Prescription  -  None  None  None    Home at Rest Exercise Oxygen Prescription  None  None  None  None    Compliance with Home Oxygen Use  Yes  Yes  Yes  Yes      Goals/Expected Outcomes   Short Term Goals  To learn and understand importance of  maintaining oxygen saturations>88%;To learn and understand importance of monitoring SPO2 with pulse oximeter and demonstrate accurate use of the pulse oximeter.;To learn and demonstrate proper pursed lip breathing techniques or other breathing techniques.  To learn and understand importance of maintaining oxygen saturations>88%;To learn and understand importance of monitoring SPO2 with pulse oximeter and demonstrate accurate use of the pulse oximeter.;To learn and demonstrate proper pursed lip breathing techniques or other breathing techniques.  To learn and understand importance of maintaining oxygen saturations>88%;To learn and understand importance of monitoring SPO2 with pulse oximeter and demonstrate accurate use of the pulse oximeter.;To learn and demonstrate proper pursed lip breathing  techniques or other breathing techniques.  To learn and understand importance of maintaining oxygen saturations>88%;To learn and understand importance of monitoring SPO2 with pulse oximeter and demonstrate accurate use of the pulse oximeter.;To learn and demonstrate proper pursed lip breathing techniques or other breathing techniques.    Long  Term Goals  Maintenance of O2 saturations>88%;Verbalizes importance of monitoring SPO2 with pulse oximeter and return demonstration;Exhibits proper breathing techniques, such as pursed lip breathing or other method taught during program session  Maintenance of O2 saturations>88%;Verbalizes importance of monitoring SPO2 with pulse oximeter and return demonstration;Exhibits proper breathing techniques, such as pursed lip breathing or other method taught during program session  Maintenance of O2 saturations>88%;Verbalizes importance of monitoring SPO2 with pulse oximeter and return demonstration;Exhibits proper breathing techniques, such as pursed lip breathing or other method taught during program session  Maintenance of O2 saturations>88%;Verbalizes importance of monitoring SPO2 with  pulse oximeter and return demonstration;Exhibits proper breathing techniques, such as pursed lip breathing or other method taught during program session    Comments  Patient verbalizes understanding of maintaining O2 saturation >88% and is able to use pulse oximeter with return demonstration. She exhibits proper pursed lip breathing techniques during exercise.   Patient verbalizes understanding of maintaining O2 saturation >88% and is able to use pulse oximeter with return demonstration. She exhibits proper pursed lip breathing techniques during exercise.   Patient verbalizes understanding of maintaining O2 saturation >88% and is able to use pulse oximeter with return demonstration. She exhibits proper pursed lip breathing techniques during exercise.   Patient verbalizes understanding of maintaining O2 saturation >88% and is able to use pulse oximeter with return demonstration. She exhibits proper pursed lip breathing techniques during exercise.     Goals/Expected Outcomes  Patient will continue to meet her short and long term goals.   Patient will continue to meet her short and long term goals.   Patient will continue to meet her short and long term goals.   Patient will continue to meet her short and long term goals.        Oxygen Discharge (Final Oxygen Re-Evaluation): Oxygen Re-Evaluation - 01/06/18 0926      Program Oxygen Prescription   Program Oxygen Prescription  None      Home Oxygen   Home Oxygen Device  None    Sleep Oxygen Prescription  None    Home Exercise Oxygen Prescription  None    Home at Rest Exercise Oxygen Prescription  None    Compliance with Home Oxygen Use  Yes      Goals/Expected Outcomes   Short Term Goals  To learn and understand importance of maintaining oxygen saturations>88%;To learn and understand importance of monitoring SPO2 with pulse oximeter and demonstrate accurate use of the pulse oximeter.;To learn and demonstrate proper pursed lip breathing techniques or  other breathing techniques.    Long  Term Goals  Maintenance of O2 saturations>88%;Verbalizes importance of monitoring SPO2 with pulse oximeter and return demonstration;Exhibits proper breathing techniques, such as pursed lip breathing or other method taught during program session    Comments  Patient verbalizes understanding of maintaining O2 saturation >88% and is able to use pulse oximeter with return demonstration. She exhibits proper pursed lip breathing techniques during exercise.     Goals/Expected Outcomes  Patient will continue to meet her short and long term goals.        Initial Exercise Prescription: Initial Exercise Prescription - 10/01/17 1000      Date of Initial Exercise RX and  Referring Provider   Date  10/01/17    Referring Provider  Dr. Annamaria Boots      NuStep   Level  1    SPM  77    Minutes  15    METs  1.8      Arm Ergometer   Level  1.2    Watts  16    RPM  22    Minutes  20    METs  2.2      Prescription Details   Frequency (times per week)  2    Duration  Progress to 30 minutes of continuous aerobic without signs/symptoms of physical distress      Intensity   THRR 40-80% of Max Heartrate  [id number]    Ratings of Perceived Exertion  11-13    Perceived Dyspnea  0-4      Progression   Progression  Continue progressive overload as per policy without signs/symptoms or physical distress.      Resistance Training   Training Prescription  Yes    Weight  1    Reps  10-15       Perform Capillary Blood Glucose checks as needed.  Exercise Prescription Changes:  Exercise Prescription Changes    Row Name 10/17/17 1200 11/02/17 0800 11/22/17 0700 12/03/17 1400 12/29/17 1400     Response to Exercise   Blood Pressure (Admit)  144/60  114/60  122/62  -  122/60   Blood Pressure (Exercise)  158/60  148/54  152/70  142/60  144/70   Blood Pressure (Exit)  146/60  118/60  132/62  120/68  115/60   Heart Rate (Admit)  64 bpm  61 bpm  58 bpm  63 bpm  61 bpm    Heart Rate (Exercise)  83 bpm  80 bpm  83 bpm  77 bpm  88 bpm   Heart Rate (Exit)  68 bpm  67 bpm  69 bpm  64 bpm  64 bpm   Oxygen Saturation (Admit)  97 %  98 %  96 %  96 %  96 %   Oxygen Saturation (Exercise)  97 %  96 %  90 %  96 %  97 %   Oxygen Saturation (Exit)  95 %  96 %  97 %  98 %  97 %   Rating of Perceived Exertion (Exercise)  11  11  11  11  11    Perceived Dyspnea (Exercise)  12  12  11  11  11    Duration  Progress to 30 minutes of  aerobic without signs/symptoms of physical distress  Progress to 30 minutes of  aerobic without signs/symptoms of physical distress  Progress to 30 minutes of  aerobic without signs/symptoms of physical distress  Progress to 30 minutes of  aerobic without signs/symptoms of physical distress  Progress to 30 minutes of  aerobic without signs/symptoms of physical distress   Intensity  THRR New 96-111-127  THRR New 93-110-126  THRR New 92-108-125  THRR unchanged  THRR unchanged     Progression   Progression  Continue to progress workloads to maintain intensity without signs/symptoms of physical distress.  Continue to progress workloads to maintain intensity without signs/symptoms of physical distress.  Continue to progress workloads to maintain intensity without signs/symptoms of physical distress.  Continue to progress workloads to maintain intensity without signs/symptoms of physical distress.  Continue to progress workloads to maintain intensity without signs/symptoms of physical distress.   Average METs  2.45  2.55  2.8  2.1  2.65     Resistance Training   Training Prescription  Yes  Yes  Yes  Yes  Yes   Weight  1  2  3  3  3    Reps  10-15  10-15  10-15  10-15  10-15   Time  5 Minutes  5 Minutes  5 Minutes  5 Minutes  5 Minutes     NuStep   Level  1  2  2  1  3    SPM  99  100  110  108  101   Minutes  15  15  17  17  17    METs  2.6  2.7  2.9  2.9  2.7     Arm Ergometer   Level  1.2  1.3  1.3  1.3  1.3   Watts  18  19  72  72  22   RPM  23  67  70   70  74   Minutes  20  20  22  22  22    METs  2.3  2.4  2.7  2.7  2.6     Home Exercise Plan   Plans to continue exercise at  Home (comment)  Home (comment)  Home (comment)  Home (comment)  Home (comment)   Frequency  Add 3 additional days to program exercise sessions.  Add 3 additional days to program exercise sessions.  Add 3 additional days to program exercise sessions.  Add 3 additional days to program exercise sessions.  Add 3 additional days to program exercise sessions.   Initial Home Exercises Provided  10/18/17  10/18/17  10/18/17  10/18/17  10/18/17      Exercise Comments:  Exercise Comments    Row Name 11/02/17 [id number] 11/22/17 0738 12/03/17 1426 12/14/17 0808 01/04/18 1008   Exercise Comments  Patient is breathing better. Able to do more and does feel like the program is helping her achieve her goals.   patient feels like the program is helping her. She has been going to strength training classes at the [facility].   patient feels like the program is helping her. She has been going to strength training classes at the [facility].   Patient feels like the program is helping her. Aside from the recent set back with her neck, she feels she is gaining strength.   Patient is pleased with how the program is helping her and getting her back to being able to be more active in the church.       Exercise Goals and Review:  Exercise Goals    Row Name 10/01/17 1009             Exercise Goals   Increase Physical Activity  Yes       Intervention  Develop an individualized exercise prescription for aerobic and resistive training based on initial evaluation findings, risk stratification, comorbidities and participant's personal goals.;Provide advice, education, support and counseling about physical activity/exercise needs.       Expected Outcomes  Short Term: Attend rehab on a regular basis to increase amount of physical activity.       Increase Strength and Stamina  Yes       Intervention  Provide  advice, education, support and counseling about physical activity/exercise needs.;Develop an individualized exercise prescription for aerobic and resistive training based on initial evaluation findings, risk stratification, comorbidities and participant's personal goals.       Expected Outcomes  Short Term:  Increase workloads from initial exercise prescription for resistance, speed, and METs.       Able to understand and use rate of perceived exertion (RPE) scale  Yes       Intervention  Provide education and explanation on how to use RPE scale       Expected Outcomes  Short Term: Able to use RPE daily in rehab to express subjective intensity level;Long Term:  Able to use RPE to guide intensity level when exercising independently       Able to understand and use Dyspnea scale  Yes       Intervention  Provide education and explanation on how to use Dyspnea scale       Expected Outcomes  Short Term: Able to use Dyspnea scale daily in rehab to express subjective sense of shortness of breath during exertion;Long Term: Able to use Dyspnea scale to guide intensity level when exercising independently       Knowledge and understanding of Target Heart Rate Range (THRR)  Yes       Intervention  Provide education and explanation of THRR including how the numbers were predicted and where they are located for reference       Expected Outcomes  Short Term: Able to state/look up THRR;Short Term: Able to use daily as guideline for intensity in rehab;Long Term: Able to use THRR to govern intensity when exercising independently       Understanding of Exercise Prescription  Yes       Intervention  Provide education, explanation, and written materials on patient's individual exercise prescription       Expected Outcomes  Short Term: Able to explain program exercise prescription;Long Term: Able to explain home exercise prescription to exercise independently          Exercise Goals Re-Evaluation : Exercise Goals  Re-Evaluation    Row Name 10/04/17 1440 11/02/17 0840 11/22/17 0735 12/03/17 1425 12/14/17 0805     Exercise Goal Re-Evaluation   Exercise Goals Review  Increase Physical Activity;Increase Strength and Stamina;Able to understand and use rate of perceived exertion (RPE) scale;Able to understand and use Dyspnea scale;Knowledge and understanding of Target Heart Rate Range (THRR);Understanding of Exercise Prescription  Increase Strength and Stamina;Increase Physical Activity;Able to understand and use rate of perceived exertion (RPE) scale;Able to understand and use Dyspnea scale;Understanding of Exercise Prescription;Knowledge and understanding of Target Heart Rate Range (THRR)  Increase Strength and Stamina;Able to understand and use Dyspnea scale;Understanding of Exercise Prescription  Increase Strength and Stamina;Able to understand and use Dyspnea scale;Understanding of Exercise Prescription  Increase Strength and Stamina;Able to understand and use Dyspnea scale;Understanding of Exercise Prescription   Comments  Patient has not yet started PR. Patient will be progressed in time in accordance with her goals.   Patient is progessing at a steady rate. Patient is able to handle increases in weights and equipment workloads.   Patient has said that she has been able to return to the [facility] to attend several fitness classes. she still wants to be able to return to the pool to do water aerobics.   Patient has said that she has been able to return to the [facility] to attend several fitness classes. She still wants to be able to return to the pool to do water aerobics.   Patient has been out due to having extreme soreness in neck. Has had to see a chriopractor and seek the assistance of massage therapyin an effort to ease the pain and discomfort. She called  and said her doctors says she can return with doing everything in a light manner.    Expected Outcomes  Patient wishes to have more energy and to breathe better and to get  back to singing and swimming again   To increase energy, breath better, get back to swimming and be able to sing on the choir again.   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   Get back to swimming and be able to sing on the choir again.   Get back to swimming and be able to sing on the choir again.    White Name 01/04/18 1005             Exercise Goal Re-Evaluation   Exercise Goals Review  Increase Strength and Stamina;Able to understand and use Dyspnea scale;Understanding of Exercise Prescription       Comments  Patient has returned and is tolerating execise well. She works hard everyday to increase her distance on each machine and is impressed with her progress thus far.        Expected Outcomes  Increase energy levels and improve shortness of breath.           Discharge Exercise Prescription (Final Exercise Prescription Changes): Exercise Prescription Changes - 12/29/17 1400      Response to Exercise   Blood Pressure (Admit)  122/60    Blood Pressure (Exercise)  144/70    Blood Pressure (Exit)  115/60    Heart Rate (Admit)  61 bpm    Heart Rate (Exercise)  88 bpm    Heart Rate (Exit)  64 bpm    Oxygen Saturation (Admit)  96 %    Oxygen Saturation (Exercise)  97 %    Oxygen Saturation (Exit)  97 %    Rating of Perceived Exertion (Exercise)  11    Perceived Dyspnea (Exercise)  11    Duration  Progress to 30 minutes of  aerobic without signs/symptoms of physical distress    Intensity  THRR unchanged      Progression   Progression  Continue to progress workloads to maintain intensity without signs/symptoms of physical distress.    Average METs  2.65      Resistance Training   Training Prescription  Yes    Weight  3    Reps  10-15    Time  5 Minutes      NuStep   Level  3    SPM  101    Minutes  17    METs  2.7      Arm Ergometer   Level  1.3    Watts  22    RPM  74    Minutes  22    METs  2.6      Home  Exercise Plan   Plans to continue exercise at  Home (comment)    Frequency  Add 3 additional days to program exercise sessions.    Initial Home Exercises Provided  10/18/17       Nutrition:  Target Goals: Understanding of nutrition guidelines, daily intake of sodium 1500mg , cholesterol 200mg , calories 30% from fat and 7% or less from saturated fats, daily to have 5 or more servings of fruits and vegetables.  Biometrics: Pre Biometrics - 10/01/17 1010      Pre Biometrics   Height  5\' 3"  (1.6 m)    Weight  92 kg    Waist Circumference  48 inches    Hip Circumference  39.5 inches    Waist to Hip Ratio  1.22 %    BMI (Calculated)  35.93    Triceps Skinfold  5 mm    % Body Fat  40.2 %    Grip Strength  51.06 kg    Flexibility  0 in    Single Leg Stand  7 seconds        Nutrition Therapy Plan and Nutrition Goals: Nutrition Therapy & Goals - 10/07/17 1503      Personal Nutrition Goals   Nutrition Goal  For heart healthy choices add >50% of whole grains, make half their plate fruits and vegetables. Discuss the difference between starchy vegetables and leafy greens, and how leafy vegetables provide fiber, helps maintain healthy weight, helps control blood glucose, and lowers cholesterol.  Discuss purchasing fresh or frozen vegetable to reduce sodium and not to add grease, fat or sugar. Consume <18oz of red meat per week. Consume lean cuts of meats and very little of meats high in sodium and nitrates such as pork and lunch meats. Discussed portion control for all food groups.        Intervention Plan   Intervention  Nutrition handout(s) given to patient.    Expected Outcomes  Short Term Goal: Understand basic principles of dietary content, such as calories, fat, sodium, cholesterol and nutrients.       Nutrition Assessments: Nutrition Assessments - 10/01/17 1132      MEDFICTS Scores   Pre Score  54       Nutrition Goals Re-Evaluation: Nutrition Goals Re-Evaluation    Row  Name 11/04/17 0752 11/24/17 1505 12/16/17 0740 01/06/18 0926       Goals   Current Weight  202 lb (91.6 kg)  200 lb (90.7 kg)  200 lb (90.7 kg)  202 lb (91.6 kg)    Nutrition Goal  For heart healthy choices add >50% of whole grains, make half their plate fruits and vegetables. Discuss the difference between starchy vegetables and leafy greens, and how leafy vegetables provide fiber, helps maintain healthy weight, helps control blood glucose, and lowers cholesterol.  Discuss purchasing fresh or frozen vegetable to reduce sodium and not to add grease, fat or sugar. Consume <18oz of red meat per week. Consume lean cuts of meats and very little of meats high in sodium and nitrates such as pork and lunch meats. Discussed portion control for all food groups.    For heart healthy choices add >50% of whole grains, make half their plate fruits and vegetables. Discuss the difference between starchy vegetables and leafy greens, and how leafy vegetables provide fiber, helps maintain healthy weight, helps control blood glucose, and lowers cholesterol.  Discuss purchasing fresh or frozen vegetable to reduce sodium and not to add grease, fat or sugar. Consume <18oz of red meat per week. Consume lean cuts of meats and very little of meats high in sodium and nitrates such as pork and lunch meats. Discussed portion control for all food groups.    For heart healthy choices add >50% of whole grains, make half their plate fruits and vegetables. Discuss the difference between starchy vegetables and leafy greens, and how leafy vegetables provide fiber, helps maintain healthy weight, helps control blood glucose, and lowers cholesterol.  Discuss purchasing fresh or frozen vegetable to reduce sodium and not to add grease, fat or sugar. Consume <18oz of red meat per week. Consume lean cuts of meats and very little of meats high  in sodium and nitrates such as pork and lunch meats. Discussed portion control for all food groups.    For  heart healthy choices add >50% of whole grains, make half their plate fruits and vegetables. Discuss the difference between starchy vegetables and leafy greens, and how leafy vegetables provide fiber, helps maintain healthy weight, helps control blood glucose, and lowers cholesterol.  Discuss purchasing fresh or frozen vegetable to reduce sodium and not to add grease, fat or sugar. Consume <18oz of red meat per week. Consume lean cuts of meats and very little of meats high in sodium and nitrates such as pork and lunch meats. Discussed portion control for all food groups.      Comment  Patient says she is eating heart healthy and is working toward meeting her nutritional goals.   Patient has lost 2 lbs since last 30 day review. She says she is continuing to eat healthy. Will continue to monitor.   Patient has maintained her weight since last 30 day review. She says she is continuing to eat healthy. Will continue to monitor.   Patient has gained 2 lbs since last 30 day review. She continues to say she is trying to eat healthy. Will continue to monitor.     Expected Outcome  Patient will continue to work toward meeting her nutritional needes. Will continue to monitor for progress.   Patient will continue to work toward meeting her nutritional needes. Will continue to monitor for progress.   Patient will continue to work toward meeting her nutritional needes. Will continue to monitor for progress.   Patient will continue to work toward meeting her nutritional needes. Will continue to monitor for progress.       Personal Goal #2 Re-Evaluation   Personal Goal #2  Patient is working on eating heart healthy. Handout given for her to improve on her food choices.   Patient is working on eating heart healthy. Handout given for her to improve on her food choices.   Patient is working on eating heart healthy. Handout given for her to improve on her food choices.   -       Nutrition Goals Discharge (Final Nutrition Goals  Re-Evaluation): Nutrition Goals Re-Evaluation - 01/06/18 0926      Goals   Current Weight  202 lb (91.6 kg)    Nutrition Goal  For heart healthy choices add >50% of whole grains, make half their plate fruits and vegetables. Discuss the difference between starchy vegetables and leafy greens, and how leafy vegetables provide fiber, helps maintain healthy weight, helps control blood glucose, and lowers cholesterol.  Discuss purchasing fresh or frozen vegetable to reduce sodium and not to add grease, fat or sugar. Consume <18oz of red meat per week. Consume lean cuts of meats and very little of meats high in sodium and nitrates such as pork and lunch meats. Discussed portion control for all food groups.      Comment  Patient has gained 2 lbs since last 30 day review. She continues to say she is trying to eat healthy. Will continue to monitor.     Expected Outcome  Patient will continue to work toward meeting her nutritional needes. Will continue to monitor for progress.        Psychosocial: Target Goals: Acknowledge presence or absence of significant depression and/or stress, maximize coping skills, provide positive support system. Participant is able to verbalize types and ability to use techniques and skills needed for reducing stress and depression.  Initial  Review & Psychosocial Screening: Initial Psych Review & Screening - 10/01/17 1134      Initial Review   Current issues with  None Identified      Family Dynamics   Good Support System?  Yes      Barriers   Psychosocial barriers to participate in program  There are no identifiable barriers or psychosocial needs.      Screening Interventions   Interventions  Encouraged to exercise    Expected Outcomes  Short Term goal: Identification and review with participant of any Quality of Life or Depression concerns found by scoring the questionnaire.;Long Term goal: The participant improves quality of Life and PHQ9 Scores as seen by post scores  and/or verbalization of changes       Quality of Life Scores: Quality of Life - 10/01/17 1010      Quality of Life   Select  Quality of Life      Quality of Life Scores   Health/Function Pre  24.4 %    Socioeconomic Pre  26.75 %    Psych/Spiritual Pre  24.86 %    Family Pre  18 %    GLOBAL Pre  24.55 %      Scores of 19 and below usually indicate a poorer quality of life in these areas.  A difference of  2-3 points is a clinically meaningful difference.  A difference of 2-3 points in the total score of the Quality of Life Index has been associated with significant improvement in overall quality of life, self-image, physical symptoms, and general health in studies assessing change in quality of life.   PHQ-9: Recent Review Flowsheet Data    Depression screen [facility] 2/9 10/01/2017   Decreased Interest 0   Down, Depressed, Hopeless 0   PHQ - 2 Score 0   Altered sleeping 2   Tired, decreased energy 2   Change in appetite 2   Feeling bad or failure about yourself  0   Trouble concentrating 0   Moving slowly or fidgety/restless 0   Suicidal thoughts 0   PHQ-9 Score 6   Difficult doing work/chores Not difficult at all     Interpretation of Total Score  Total Score Depression Severity:  1-4 = Minimal depression, 5-9 = Mild depression, 10-14 = Moderate depression, 15-19 = Moderately severe depression, 20-27 = Severe depression   Psychosocial Evaluation and Intervention: Psychosocial Evaluation - 10/01/17 1135      Psychosocial Evaluation & Interventions   Interventions  Encouraged to exercise with the program and follow exercise prescription    Continue Psychosocial Services   No Follow up required       Psychosocial Re-Evaluation: Psychosocial Re-Evaluation    Row Name 11/04/17 0749 11/24/17 1508 12/16/17 0744 01/06/18 0929       Psychosocial Re-Evaluation   Current issues with  None Identified  None Identified  None Identified  None Identified    Comments  Patient  initial QOL score was 18 and her PHQ-9 score was 6 with no psychosocial issues identified at orientation. Will continue to monitor.  Patient initial QOL score was 18 and her PHQ-9 score was 6 with no psychosocial issues identified at orientation. Will continue to monitor.  Patient initial QOL score was 18 and her PHQ-9 score was 6 with no psychosocial issues identified. Will continue to monitor.  Patient initial QOL score was 18 and her PHQ-9 score was 6 with no psychosocial issues identified. Will continue to monitor.    Expected Outcomes  Patient will have no psychosocial issues identified at discharge.   Patient will have no psychosocial issues identified at discharge.   Patient will have no psychosocial issues identified at discharge.   Patient will have no psychosocial issues identified at discharge.     Interventions  Relaxation education;Encouraged to attend Pulmonary Rehabilitation for the exercise;Stress management education  Relaxation education;Encouraged to attend Pulmonary Rehabilitation for the exercise;Stress management education  Relaxation education;Encouraged to attend Pulmonary Rehabilitation for the exercise;Stress management education  Relaxation education;Encouraged to attend Pulmonary Rehabilitation for the exercise;Stress management education    Continue Psychosocial Services   No Follow up required  No Follow up required  No Follow up required  No Follow up required       Psychosocial Discharge (Final Psychosocial Re-Evaluation): Psychosocial Re-Evaluation - 01/06/18 0929      Psychosocial Re-Evaluation   Current issues with  None Identified    Comments  Patient initial QOL score was 18 and her PHQ-9 score was 6 with no psychosocial issues identified. Will continue to monitor.    Expected Outcomes  Patient will have no psychosocial issues identified at discharge.     Interventions  Relaxation education;Encouraged to attend Pulmonary Rehabilitation for the exercise;Stress  management education    Continue Psychosocial Services   No Follow up required        Education: Education Goals: Education classes will be provided on a weekly basis, covering required topics. Participant will state understanding/return demonstration of topics presented.  Learning Barriers/Preferences: Learning Barriers/Preferences - 10/01/17 1020      Learning Barriers/Preferences   Learning Barriers  None    Learning Preferences  Skilled Demonstration;Individual Instruction;Group Instruction       Education Topics: How Lungs Work and Diseases: - Discuss the anatomy of the lungs and diseases that can affect the lungs, such as COPD.   PULMONARY REHAB OTHER RESPIRATORY from 12/23/2017 in [place]  Date  11/04/17  Educator  DC  Instruction Review Code  2- Demonstrated Understanding      Exercise: -Discuss the importance of exercise, FITT principles of exercise, normal and abnormal responses to exercise, and how to exercise safely.   Environmental Irritants: -Discuss types of environmental irritants and how to limit exposure to environmental irritants.   PULMONARY REHAB OTHER RESPIRATORY from 12/23/2017 in [place]  Date  11/11/17  Educator  DWynetta Emery  Instruction Review Code  2- Demonstrated Understanding      Meds/Inhalers and oxygen: - Discuss respiratory medications, definition of an inhaler and oxygen, and the proper way to use an inhaler and oxygen.   PULMONARY REHAB OTHER RESPIRATORY from 12/23/2017 in [place]  Date  11/18/17  Educator  D. Wynetta Emery      Energy Saving Techniques: - Discuss methods to conserve energy and decrease shortness of breath when performing activities of daily living.    PULMONARY REHAB OTHER RESPIRATORY from 12/23/2017 in [place]  Date  11/25/17  Educator  Etheleen Mayhew  Instruction Review Code  2- Demonstrated Understanding       Bronchial Hygiene / Breathing Techniques: - Discuss breathing mechanics, pursed-lip breathing technique,  proper posture, effective ways to clear airways, and other functional breathing techniques   Cleaning Equipment: - Provides group verbal and written instruction about the health risks of elevated stress, cause of high stress, and healthy ways to reduce stress.   Nutrition I: Fats: - Discuss the types of cholesterol, what cholesterol does to the body, and  how cholesterol levels can be controlled.   PULMONARY REHAB OTHER RESPIRATORY from 12/23/2017 in [place]  Date  12/16/17  Educator  Etheleen Mayhew  Instruction Review Code  2- Demonstrated Understanding      Nutrition II: Labels: -Discuss the different components of food labels and how to read food labels.   PULMONARY REHAB OTHER RESPIRATORY from 12/23/2017 in [place]  Date  12/23/17  Educator  DWynetta Emery  Instruction Review Code  2- Demonstrated Understanding      Respiratory Infections: - Discuss the signs and symptoms of respiratory infections, ways to prevent respiratory infections, and the importance of seeking medical treatment when having a respiratory infection.   Stress I: Signs and Symptoms: - Discuss the causes of stress, how stress may lead to anxiety and depression, and ways to limit stress.   PULMONARY REHAB OTHER RESPIRATORY from 12/23/2017 in [place]  Date  10/07/17  Educator  DC  Instruction Review Code  2- Demonstrated Understanding      Stress II: Relaxation: -Discuss relaxation techniques to limit stress.   PULMONARY REHAB OTHER RESPIRATORY from 12/23/2017 in [place]  Date  10/14/17  Educator  DC  Instruction Review Code  2- Demonstrated Understanding      Oxygen for Home/Travel: - Discuss how to prepare for travel when on oxygen and proper ways to transport and store oxygen to ensure  safety.   Knowledge Questionnaire Score: Knowledge Questionnaire Score - 10/01/17 1118      Knowledge Questionnaire Score   Pre Score  14/18       Core Components/Risk Factors/Patient Goals at Admission: Personal Goals and Risk Factors at Admission - 10/01/17 1132      Core Components/Risk Factors/Patient Goals on Admission    Weight Management  Obesity    Personal Goal Other  Yes    Personal Goal  Have more energy, breath better, get back to swimming, sing on the choir again.     Intervention  Attend CR 3 x week and supplement with at home exercise 2 x week.     Expected Outcomes  Achieve personal goals.        Core Components/Risk Factors/Patient Goals Review:  Goals and Risk Factor Review    Row Name 11/04/17 0745 11/24/17 1506 12/16/17 0741 01/06/18 0927       Core Components/Risk Factors/Patient Goals Review   Personal Goals Review  Weight Management/Obesity;Improve shortness of breath with ADL's;Develop more efficient breathing techniques such as purse lipped breathing and diaphragmatic breathing and practicing self-pacing with activity. More energy; breathe better; get back to swimming; sing again.  Weight Management/Obesity;Improve shortness of breath with ADL's;Develop more efficient breathing techniques such as purse lipped breathing and diaphragmatic breathing and practicing self-pacing with activity. More energy; breathe better; get back to swimming; sing again.   Weight Management/Obesity;Improve shortness of breath with ADL's;Develop more efficient breathing techniques such as purse lipped breathing and diaphragmatic breathing and practicing self-pacing with activity. More energy; breathe better; get back to swimming; sing again.   Weight Management/Obesity;Improve shortness of breath with ADL's;Develop more efficient breathing techniques such as purse lipped breathing and diaphragmatic breathing and practicing self-pacing with activity. More energy; breathe better; get  back to swimming; sing again.     Review  Patient has completed 8 sessions maintaining her weight. She is doing well in the program with some progression. She says she does feel her energy has increased. She is able to demonstrate proper  pursed lip breathing techniques in the program during exercise. She does not see any improvement in her breathing yet but hopes to continue to improve as she continues the program. Will continue to monitor for progress.   Patient has completed 13 sessions losing 2 lbs since last 30 day review. She continues to do well in the program with progressions. She continues to say she feels stronger and feels she is less SOB with activities. She is now able to participate in 2 classes at the [facility] and is planning to join a group of women that walk in her neighborhood. She is very pleased with her progress and hopes to see even more improvements as she continues. Will continue to monitor for progress.   Patient has completed 15 sessions maintaining her weight. She has only attended 2 sessions in this 30 day review due to pain in her back and shoulders. She was evaluated by her pcp who says she pulled a muscle. She continues to do well in the program and will hopefully be able to be more consistent in her attendance. She hopes to get back to her classes at the [facility]. Will continue to monitor for progress.   Patient has completed 20 sessions gaining 2 lbs since last 30 day review. She continues to do well in the program with progression. Her back and shoulders have healed. She says she feels stronger and has more energy. She is also breathing better and building upper body strength. She is going to classes at the [facility] but has not started swimming yet. Will continue to monitor for progress.     Expected Outcomes  Patient will continue to attend sessions and complete the program meeting her personal goals.   Patient will continue to attend sessions and complete the program meeting her personal  goals.   Patient will continue to attend sessions and complete the program meeting her personal goals.   Patient will continue to attend sessions and complete the program meeting her personal goals.        Core Components/Risk Factors/Patient Goals at Discharge (Final Review):  Goals and Risk Factor Review - 01/06/18 0927      Core Components/Risk Factors/Patient Goals Review   Personal Goals Review  Weight Management/Obesity;Improve shortness of breath with ADL's;Develop more efficient breathing techniques such as purse lipped breathing and diaphragmatic breathing and practicing self-pacing with activity.   More energy; breathe better; get back to swimming; sing again.    Review  Patient has completed 20 sessions gaining 2 lbs since last 30 day review. She continues to do well in the program with progression. Her back and shoulders have healed. She says she feels stronger and has more energy. She is also breathing better and building upper body strength. She is going to classes at the [facility] but has not started swimming yet. Will continue to monitor for progress.     Expected Outcomes  Patient will continue to attend sessions and complete the program meeting her personal goals.        ITP Comments: ITP Comments    Row Name 10/06/17 1353 10/07/17 1504         ITP Comments  Patient new to program. She has completed 2 sessions. Will continue to monitor for progress.   Patient attended the [facility]ters class with hospital chaplian to discuss how this event has impacted their life.          Comments: ITP REVIEW Pt is making expected progress toward pulmonary rehab goals  after completing 20 sessions. Recommend continued exercise, life style modification, education, and utilization of breathing techniques to increase stamina and strength and decrease shortness of breath with exertion.

## 2018-01-11 ENCOUNTER — Encounter (HOSPITAL_COMMUNITY)
Admission: RE | Admit: 2018-01-11 | Discharge: 2018-01-11 | Disposition: A | Discharge status: Home / Self Care | Payer: Medicare Other | Source: Ambulatory Visit | Attending: Internal Medicine | Admitting: Internal Medicine

## 2018-01-11 DIAGNOSIS — J4550 Severe persistent asthma, uncomplicated: Secondary | ICD-10-CM | POA: Diagnosis not present

## 2018-01-11 DIAGNOSIS — G4733 Obstructive sleep apnea (adult) (pediatric): Secondary | ICD-10-CM | POA: Diagnosis not present

## 2018-01-11 DIAGNOSIS — F329 Major depressive disorder, single episode, unspecified: Secondary | ICD-10-CM | POA: Diagnosis not present

## 2018-01-11 DIAGNOSIS — F419 Anxiety disorder, unspecified: Secondary | ICD-10-CM | POA: Diagnosis not present

## 2018-01-11 DIAGNOSIS — I4892 Unspecified atrial flutter: Secondary | ICD-10-CM | POA: Diagnosis not present

## 2018-01-11 DIAGNOSIS — I480 Paroxysmal atrial fibrillation: Secondary | ICD-10-CM | POA: Diagnosis not present

## 2018-01-11 NOTE — Progress Notes (Signed)
Daily Session Note  Patient Details  Name: AQUITA SIMMERING MRN: [id number] Date of Birth: 02-18-1941 Referring Provider:     PULMONARY REHAB OTHER RESPIRATORY from 10/01/2017 in [place]  Referring Provider  Dr. Annamaria Boots      Encounter Date: 01/11/2018  Check In: Session Check In - 01/11/18 1330      Check-In   Supervising physician immediately available to respond to emergencies  See telemetry face sheet for immediately available MD    Location  [facility]    Staff Present  Russella Dar, MS, EP, [facility], Exercise Physiologist;Westley Blass Zachery Conch, Exercise Physiologist    Medication changes reported      No    Fall or balance concerns reported     No    Warm-up and Cool-down  Performed as group-led instruction    Resistance Training Performed  Yes    VAD Patient?  No    PAD/SET Patient?  No      Pain Assessment   Currently in Pain?  No/denies    Multiple Pain Sites  No       Capillary Blood Glucose: No results found for this or any previous visit (from the past 24 hour(s)).    Social History   Tobacco Use  Smoking Status Never Smoker  Smokeless Tobacco Never Used    Goals Met:  Proper associated with RPD/PD & O2 Sat Independence with exercise equipment Using PLB without cueing & demonstrates good technique Exercise tolerated well No report of cardiac concerns or symptoms Strength training completed today  Goals Unmet:  Not Applicable  Comments: Pt able to follow exercise prescription today without complaint.  Will continue to monitor for progression. Check out 2:30.   Dr. Sinda Du is Medical Director for [facility] Pulmonary Rehab.

## 2018-01-13 ENCOUNTER — Encounter (HOSPITAL_COMMUNITY)
Admission: RE | Admit: 2018-01-13 | Discharge: 2018-01-13 | Disposition: A | Discharge status: Home / Self Care | Payer: Medicare Other | Source: Ambulatory Visit | Attending: Internal Medicine | Admitting: Internal Medicine

## 2018-01-13 DIAGNOSIS — F419 Anxiety disorder, unspecified: Secondary | ICD-10-CM | POA: Diagnosis not present

## 2018-01-13 DIAGNOSIS — I480 Paroxysmal atrial fibrillation: Secondary | ICD-10-CM | POA: Diagnosis not present

## 2018-01-13 DIAGNOSIS — I4892 Unspecified atrial flutter: Secondary | ICD-10-CM | POA: Diagnosis not present

## 2018-01-13 DIAGNOSIS — J4550 Severe persistent asthma, uncomplicated: Secondary | ICD-10-CM

## 2018-01-13 DIAGNOSIS — G4733 Obstructive sleep apnea (adult) (pediatric): Secondary | ICD-10-CM | POA: Diagnosis not present

## 2018-01-13 DIAGNOSIS — F329 Major depressive disorder, single episode, unspecified: Secondary | ICD-10-CM | POA: Diagnosis not present

## 2018-01-13 NOTE — Progress Notes (Signed)
Daily Session Note  Patient Details  Name: [name] [name] MRN: [id number] Date of Birth: May 17, 1940 Referring Provider:     PULMONARY REHAB OTHER RESPIRATORY from 10/01/2017 in [place]  Referring Provider  Dr. Annamaria Boots      Encounter Date: 01/13/2018  Check In: Session Check In - 01/13/18 1330      Check-In   Supervising physician immediately available to respond to emergencies  See telemetry face sheet for immediately available MD    Location  [facility]    Staff Present  Russella Dar, MS, EP, [facility], Exercise Physiologist;Domnick Chervenak, Exercise Physiologist;Debra Wynetta Emery, RN, BSN    Medication changes reported      No    Fall or balance concerns reported     No    Warm-up and Cool-down  Performed as group-led Higher education careers adviser Performed  Yes    VAD Patient?  No    PAD/SET Patient?  No      Pain Assessment   Currently in Pain?  Yes    Multiple Pain Sites  No       Capillary Blood Glucose: No results found for this or any previous visit (from the past 24 hour(s)).    Social History   Tobacco Use  Smoking Status Never Smoker  Smokeless Tobacco Never Used    Goals Met:  Proper associated with RPD/PD & O2 Sat Independence with exercise equipment Using PLB without cueing & demonstrates good technique Exercise tolerated well No report of cardiac concerns or symptoms Strength training completed today  Goals Unmet:  Not Applicable  Comments: Pt able to follow exercise prescription today without complaint.  Will continue to monitor for progression. Check out 2:30.   Dr. Sinda Du is Medical Director for [facility] Pulmonary Rehab.

## 2018-01-18 ENCOUNTER — Encounter (HOSPITAL_COMMUNITY)
Admission: RE | Admit: 2018-01-18 | Discharge: 2018-01-18 | Disposition: A | Discharge status: Home / Self Care | Payer: Medicare Other | Source: Ambulatory Visit | Attending: Internal Medicine | Admitting: Internal Medicine

## 2018-01-18 DIAGNOSIS — I4892 Unspecified atrial flutter: Secondary | ICD-10-CM | POA: Diagnosis not present

## 2018-01-18 DIAGNOSIS — J4550 Severe persistent asthma, uncomplicated: Secondary | ICD-10-CM

## 2018-01-18 DIAGNOSIS — G4733 Obstructive sleep apnea (adult) (pediatric): Secondary | ICD-10-CM | POA: Diagnosis not present

## 2018-01-18 DIAGNOSIS — F329 Major depressive disorder, single episode, unspecified: Secondary | ICD-10-CM | POA: Diagnosis not present

## 2018-01-18 DIAGNOSIS — I480 Paroxysmal atrial fibrillation: Secondary | ICD-10-CM | POA: Diagnosis not present

## 2018-01-18 DIAGNOSIS — F419 Anxiety disorder, unspecified: Secondary | ICD-10-CM | POA: Diagnosis not present

## 2018-01-18 NOTE — Progress Notes (Signed)
Daily Session Note  Patient Details  Name: [name] [name] MRN: [id number] Date of Birth: 1941-03-01 Referring Provider:     PULMONARY REHAB OTHER RESPIRATORY from 10/01/2017 in [place]  Referring Provider  Dr. Annamaria Boots      Encounter Date: 01/18/2018  Check In: Session Check In - 01/18/18 1330      Check-In   Supervising physician immediately available to respond to emergencies  See telemetry face sheet for immediately available MD    Location  [facility]    Staff Present  Russella Dar, MS, EP, [facility], Exercise Physiologist;Yeshaya Vath Zachery Conch, Exercise Physiologist    Medication changes reported      No    Fall or balance concerns reported     No    Warm-up and Cool-down  Performed as group-led instruction    Resistance Training Performed  Yes    VAD Patient?  No    PAD/SET Patient?  No      Pain Assessment   Currently in Pain?  No/denies    Pain Score  0-No pain    Multiple Pain Sites  No       Capillary Blood Glucose: No results found for this or any previous visit (from the past 24 hour(s)).    Social History   Tobacco Use  Smoking Status Never Smoker  Smokeless Tobacco Never Used    Goals Met:  Proper associated with RPD/PD & O2 Sat Independence with exercise equipment Exercise tolerated well No report of cardiac concerns or symptoms Strength training completed today  Goals Unmet:  Not Applicable  Comments: Pt able to follow exercise prescription today without complaint.  Will continue to monitor for progression. Check out 2:30.   Dr. Sinda Du is Medical Director for [facility].

## 2018-01-20 ENCOUNTER — Encounter (HOSPITAL_COMMUNITY)
Admission: RE | Admit: 2018-01-20 | Discharge: 2018-01-20 | Disposition: A | Discharge status: Home / Self Care | Payer: Medicare Other | Source: Ambulatory Visit | Attending: Internal Medicine | Admitting: Internal Medicine

## 2018-01-20 DIAGNOSIS — J4550 Severe persistent asthma, uncomplicated: Secondary | ICD-10-CM | POA: Diagnosis not present

## 2018-01-20 DIAGNOSIS — I480 Paroxysmal atrial fibrillation: Secondary | ICD-10-CM | POA: Diagnosis not present

## 2018-01-20 DIAGNOSIS — I4892 Unspecified atrial flutter: Secondary | ICD-10-CM | POA: Diagnosis not present

## 2018-01-20 DIAGNOSIS — F329 Major depressive disorder, single episode, unspecified: Secondary | ICD-10-CM | POA: Diagnosis not present

## 2018-01-20 DIAGNOSIS — F419 Anxiety disorder, unspecified: Secondary | ICD-10-CM | POA: Diagnosis not present

## 2018-01-20 DIAGNOSIS — G4733 Obstructive sleep apnea (adult) (pediatric): Secondary | ICD-10-CM | POA: Diagnosis not present

## 2018-01-20 NOTE — Progress Notes (Signed)
Daily Session Note  Patient Details  Name: AIJA SCARFO MRN: [id number] Date of Birth: [birth year] Referring Provider:     PULMONARY REHAB OTHER RESPIRATORY from 10/01/2017 in [place]  Referring Provider  Dr. Annamaria Boots      Encounter Date: 01/20/2018  Check In: Session Check In - 01/20/18 1330      Check-In   Supervising physician immediately available to respond to emergencies  See telemetry face sheet for immediately available MD    Location  [facility]    Staff Present  Aundra Dubin, RN, Cory Munch, Exercise Physiologist    Medication changes reported      No    Fall or balance concerns reported     No    Warm-up and Cool-down  Performed as group-led instruction    Resistance Training Performed  Yes    VAD Patient?  No    PAD/SET Patient?  No      Pain Assessment   Currently in Pain?  Yes    Multiple Pain Sites  No       Capillary Blood Glucose: No results found for this or any previous visit (from the past 24 hour(s)).    Social History   Tobacco Use  Smoking Status Never Smoker  Smokeless Tobacco Never Used    Goals Met:  Proper associated with RPD/PD & O2 Sat Independence with exercise equipment Exercise tolerated well No report of cardiac concerns or symptoms Strength training completed today  Goals Unmet:  Not Applicable  Comments: Pt able to follow exercise prescription today without complaint.  Will continue to monitor for progression. Check out 2:30.   Dr. Sinda Du is Medical Director for [facility].

## 2018-01-24 DIAGNOSIS — I10 Essential (primary) hypertension: Secondary | ICD-10-CM | POA: Diagnosis not present

## 2018-01-24 DIAGNOSIS — Z299 Encounter for prophylactic measures, unspecified: Secondary | ICD-10-CM | POA: Diagnosis not present

## 2018-01-24 DIAGNOSIS — L719 Rosacea, unspecified: Secondary | ICD-10-CM | POA: Diagnosis not present

## 2018-01-24 DIAGNOSIS — Z6837 Body mass index (BMI) 37.0-37.9, adult: Secondary | ICD-10-CM | POA: Diagnosis not present

## 2018-01-25 ENCOUNTER — Telehealth: Payer: Self-pay | Admitting: Internal Medicine

## 2018-01-25 ENCOUNTER — Encounter (HOSPITAL_COMMUNITY)
Admission: RE | Admit: 2018-01-25 | Discharge: 2018-01-25 | Disposition: A | Discharge status: Home / Self Care | Payer: Medicare Other | Source: Ambulatory Visit | Attending: Internal Medicine | Admitting: Internal Medicine

## 2018-01-25 DIAGNOSIS — F419 Anxiety disorder, unspecified: Secondary | ICD-10-CM | POA: Diagnosis not present

## 2018-01-25 DIAGNOSIS — G4733 Obstructive sleep apnea (adult) (pediatric): Secondary | ICD-10-CM | POA: Diagnosis not present

## 2018-01-25 DIAGNOSIS — I4892 Unspecified atrial flutter: Secondary | ICD-10-CM | POA: Diagnosis not present

## 2018-01-25 DIAGNOSIS — J4550 Severe persistent asthma, uncomplicated: Secondary | ICD-10-CM

## 2018-01-25 DIAGNOSIS — F329 Major depressive disorder, single episode, unspecified: Secondary | ICD-10-CM | POA: Diagnosis not present

## 2018-01-25 DIAGNOSIS — I480 Paroxysmal atrial fibrillation: Secondary | ICD-10-CM | POA: Diagnosis not present

## 2018-01-25 NOTE — Progress Notes (Signed)
Daily Session Note  Patient Details  Name: [name] [name] MRN: [id number] Date of Birth: 12/10/1940 Referring Provider:     PULMONARY REHAB OTHER RESPIRATORY from 10/01/2017 in [place]  Referring Provider  Dr. Annamaria Boots      Encounter Date: 01/25/2018  Check In: Session Check In - 01/25/18 1330      Check-In   Supervising physician immediately available to respond to emergencies  See telemetry face sheet for immediately available MD    Location  [facility]    Staff Present  Russella Dar, MS, EP, [facility], Exercise Physiologist;Rechel Delosreyes Zachery Conch, Exercise Physiologist    Medication changes reported      No    Warm-up and Cool-down  Performed as group-led instruction    Resistance Training Performed  Yes    VAD Patient?  No    PAD/SET Patient?  No      Pain Assessment   Currently in Pain?  Yes    Pain Score  5     Pain Location  Hip    Pain Descriptors / Indicators  Aching    Pain Type  Acute pain    Pain Onset  In the past 7 days    Pain Frequency  Rarely    Multiple Pain Sites  No       Capillary Blood Glucose: No results found for this or any previous visit (from the past 24 hour(s)).    Social History   Tobacco Use  Smoking Status Never Smoker  Smokeless Tobacco Never Used    Goals Met:  Proper associated with RPD/PD & O2 Sat Independence with exercise equipment Using PLB without cueing & demonstrates good technique Exercise tolerated well No report of cardiac concerns or symptoms Strength training completed today  Goals Unmet:  Not Applicable  Comments: Pt able to follow exercise prescription today without complaint.  Will continue to monitor for progression. Check out 2:30.   Dr. Sinda Du is Medical Director for [facility].

## 2018-01-26 ENCOUNTER — Telehealth: Payer: Self-pay | Admitting: Pharmacy Technician

## 2018-01-26 ENCOUNTER — Telehealth: Payer: Self-pay | Admitting: Internal Medicine

## 2018-01-26 MED ORDER — UMECLIDINIUM-VILANTEROL 62.5-25 MCG/INH IN AEPB: 1.0000 | INHALATION_SPRAY | Freq: Every day | RESPIRATORY_TRACT | 3 refills | Status: DC

## 2018-01-26 NOTE — Telephone Encounter (Signed)
1 Vial Order Date: 01/25/18 Shipping Date: 01/25/18

## 2018-01-26 NOTE — Telephone Encounter (Signed)
1 vial Arrival Date:01/26/18 Lot #: [id number] Exp Date:06/2021

## 2018-01-26 NOTE — Telephone Encounter (Signed)
Created in error

## 2018-01-26 NOTE — Telephone Encounter (Signed)
Spoke with the pt  She states that she is needing [place] to fax her rx for Anoro for a 90 days supply with 3 rf to [place]  She is needing the cover letter to include her pt id number- [id number] and that the rx is for Anoro  She wants me to mail the cover letter once we are done faxing this  I faxed over the rx along with cover letter and mailed the cover letter to pt's home address which I verified with her  I included the fax confirmation for pt to have

## 2018-01-27 ENCOUNTER — Encounter (HOSPITAL_COMMUNITY): Payer: Medicare Other

## 2018-01-27 DIAGNOSIS — M9903 Segmental and somatic dysfunction of lumbar region: Secondary | ICD-10-CM | POA: Diagnosis not present

## 2018-01-27 DIAGNOSIS — M9902 Segmental and somatic dysfunction of thoracic region: Secondary | ICD-10-CM | POA: Diagnosis not present

## 2018-01-27 DIAGNOSIS — M9905 Segmental and somatic dysfunction of pelvic region: Secondary | ICD-10-CM | POA: Diagnosis not present

## 2018-01-27 DIAGNOSIS — M546 Pain in thoracic spine: Secondary | ICD-10-CM | POA: Diagnosis not present

## 2018-01-27 DIAGNOSIS — M545 Low back pain: Secondary | ICD-10-CM | POA: Diagnosis not present

## 2018-01-27 DIAGNOSIS — M542 Cervicalgia: Secondary | ICD-10-CM | POA: Diagnosis not present

## 2018-01-27 DIAGNOSIS — M9901 Segmental and somatic dysfunction of cervical region: Secondary | ICD-10-CM | POA: Diagnosis not present

## 2018-01-31 DIAGNOSIS — M9903 Segmental and somatic dysfunction of lumbar region: Secondary | ICD-10-CM | POA: Diagnosis not present

## 2018-01-31 DIAGNOSIS — M9902 Segmental and somatic dysfunction of thoracic region: Secondary | ICD-10-CM | POA: Diagnosis not present

## 2018-01-31 DIAGNOSIS — M542 Cervicalgia: Secondary | ICD-10-CM | POA: Diagnosis not present

## 2018-01-31 DIAGNOSIS — M546 Pain in thoracic spine: Secondary | ICD-10-CM | POA: Diagnosis not present

## 2018-01-31 DIAGNOSIS — M9905 Segmental and somatic dysfunction of pelvic region: Secondary | ICD-10-CM | POA: Diagnosis not present

## 2018-01-31 DIAGNOSIS — M545 Low back pain: Secondary | ICD-10-CM | POA: Diagnosis not present

## 2018-01-31 DIAGNOSIS — M9901 Segmental and somatic dysfunction of cervical region: Secondary | ICD-10-CM | POA: Diagnosis not present

## 2018-02-01 ENCOUNTER — Encounter (HOSPITAL_COMMUNITY)
Admission: RE | Admit: 2018-02-01 | Discharge: 2018-02-01 | Disposition: A | Discharge status: Home / Self Care | Payer: Medicare Other | Source: Ambulatory Visit | Attending: Internal Medicine | Admitting: Internal Medicine

## 2018-02-01 DIAGNOSIS — G4733 Obstructive sleep apnea (adult) (pediatric): Secondary | ICD-10-CM | POA: Insufficient documentation

## 2018-02-01 DIAGNOSIS — F329 Major depressive disorder, single episode, unspecified: Secondary | ICD-10-CM | POA: Insufficient documentation

## 2018-02-01 DIAGNOSIS — I4892 Unspecified atrial flutter: Secondary | ICD-10-CM | POA: Diagnosis not present

## 2018-02-01 DIAGNOSIS — E669 Obesity, unspecified: Secondary | ICD-10-CM | POA: Insufficient documentation

## 2018-02-01 DIAGNOSIS — J449 Chronic obstructive pulmonary disease, unspecified: Secondary | ICD-10-CM | POA: Diagnosis not present

## 2018-02-01 DIAGNOSIS — I480 Paroxysmal atrial fibrillation: Secondary | ICD-10-CM | POA: Insufficient documentation

## 2018-02-01 DIAGNOSIS — J4550 Severe persistent asthma, uncomplicated: Secondary | ICD-10-CM | POA: Insufficient documentation

## 2018-02-01 DIAGNOSIS — F419 Anxiety disorder, unspecified: Secondary | ICD-10-CM | POA: Insufficient documentation

## 2018-02-01 DIAGNOSIS — E785 Hyperlipidemia, unspecified: Secondary | ICD-10-CM | POA: Insufficient documentation

## 2018-02-01 DIAGNOSIS — I10 Essential (primary) hypertension: Secondary | ICD-10-CM | POA: Diagnosis not present

## 2018-02-01 DIAGNOSIS — Z7901 Long term (current) use of anticoagulants: Secondary | ICD-10-CM | POA: Diagnosis not present

## 2018-02-01 DIAGNOSIS — Z79899 Other long term (current) drug therapy: Secondary | ICD-10-CM | POA: Diagnosis not present

## 2018-02-01 NOTE — Progress Notes (Signed)
Daily Session Note  Patient Details  Name: KITA NEACE MRN: [id number] Date of Birth: [birth year] Referring Provider:     PULMONARY REHAB OTHER RESPIRATORY from 10/01/2017 in [place]  Referring Provider  Dr. Annamaria Boots      Encounter Date: 02/01/2018  Check In: Session Check In - 02/01/18 1330      Check-In   Supervising physician immediately available to respond to emergencies  See telemetry face sheet for immediately available MD    Location  [facility]    Staff Present  Russella Dar, MS, EP, [facility] Exercise Physiologist;Isayah Ignasiak Zachery Conch, Exercise Physiologist    Medication changes reported      No    Fall or balance concerns reported     No    Warm-up and Cool-down  Performed as group-led instruction    Resistance Training Performed  Yes    VAD Patient?  No      Pain Assessment   Currently in Pain?  Yes    Pain Score  5     Pain Location  Hip    Pain Orientation  Right;Left    Pain Descriptors / Indicators  Aching    Pain Type  Acute pain    Pain Radiating Towards  No Radiation    Pain Onset  1 to 4 weeks ago    Pain Frequency  Rarely    Aggravating Factors   Certain movements.    Pain Relieving Factors  Rest and baclofen.     Multiple Pain Sites  No       Capillary Blood Glucose: No results found for this or any previous visit (from the past 24 hour(s)).    Social History   Tobacco Use  Smoking Status Never Smoker  Smokeless Tobacco Never Used    Goals Met:  Independence with exercise equipment Exercise tolerated well No report of cardiac concerns or symptoms Strength training completed today  Goals Unmet:  Not Applicable  Comments: Pt able to follow exercise prescription today without complaint.  Will continue to monitor for progression. Check out 2:30.   Dr. Sinda Du is Medical Director for [facility].

## 2018-02-02 DIAGNOSIS — I10 Essential (primary) hypertension: Secondary | ICD-10-CM | POA: Diagnosis not present

## 2018-02-02 DIAGNOSIS — Z6837 Body mass index (BMI) 37.0-37.9, adult: Secondary | ICD-10-CM | POA: Diagnosis not present

## 2018-02-02 DIAGNOSIS — I4891 Unspecified atrial fibrillation: Secondary | ICD-10-CM | POA: Diagnosis not present

## 2018-02-02 DIAGNOSIS — Z299 Encounter for prophylactic measures, unspecified: Secondary | ICD-10-CM | POA: Diagnosis not present

## 2018-02-03 ENCOUNTER — Encounter (HOSPITAL_COMMUNITY)
Admission: RE | Admit: 2018-02-03 | Discharge: 2018-02-03 | Disposition: A | Discharge status: Home / Self Care | Payer: Medicare Other | Source: Ambulatory Visit | Attending: Internal Medicine | Admitting: Internal Medicine

## 2018-02-03 DIAGNOSIS — F419 Anxiety disorder, unspecified: Secondary | ICD-10-CM | POA: Diagnosis not present

## 2018-02-03 DIAGNOSIS — J4550 Severe persistent asthma, uncomplicated: Secondary | ICD-10-CM

## 2018-02-03 DIAGNOSIS — I4892 Unspecified atrial flutter: Secondary | ICD-10-CM | POA: Diagnosis not present

## 2018-02-03 DIAGNOSIS — G4733 Obstructive sleep apnea (adult) (pediatric): Secondary | ICD-10-CM | POA: Diagnosis not present

## 2018-02-03 DIAGNOSIS — F329 Major depressive disorder, single episode, unspecified: Secondary | ICD-10-CM | POA: Diagnosis not present

## 2018-02-03 DIAGNOSIS — I480 Paroxysmal atrial fibrillation: Secondary | ICD-10-CM | POA: Diagnosis not present

## 2018-02-03 NOTE — Progress Notes (Signed)
Pulmonary Individual Treatment Plan  Patient Details  Name: [name] [name] MRN: [id number] Date of Birth: [birth year] Referring Provider:     PULMONARY REHAB OTHER RESPIRATORY from 10/01/2017 in [place]  Referring Provider  Dr. Annamaria Boots      Initial Encounter Date:    PULMONARY REHAB OTHER RESPIRATORY from 10/01/2017 in [facility]  Date  10/01/17      Visit Diagnosis: Asthma in adult, severe persistent, uncomplicated  Patient's Home Medications on Admission:   Current Outpatient Medications:  .  acetaminophen (TYLENOL) 500 MG tablet, Take 500 mg by mouth daily as needed for headache., Disp: , Rfl:  .  antiseptic oral rinse (BIOTENE) LIQD, 15 mLs by Mouth Rinse route as needed for dry mouth., Disp: , Rfl:  .  atorvastatin (LIPITOR) 10 MG tablet, Take 10 mg by mouth every evening. , Disp: , Rfl:  .  baclofen (LIORESAL) 10 MG tablet, Take 10 mg by mouth as needed for muscle spasms., Disp: , Rfl:  .  benzonatate (TESSALON) 200 MG capsule, Take 1 capsule (200 mg total) by mouth 3 (three) times daily as needed for cough., Disp: 30 capsule, Rfl: 3 .  Biotin 5000 MCG CAPS, Take 5,000 mcg by mouth daily., Disp: , Rfl:  .  budesonide (PULMICORT) 0.5 MG/2ML nebulizer solution, Take 0.5 mg by nebulization 2 (two) times daily. , Disp: , Rfl:  .  cetirizine (ZYRTEC) 10 MG tablet, Take 10 mg by mouth daily as needed for allergies., Disp: , Rfl:  .  clindamycin (CLEOCIN) 300 MG capsule, 2 capsules as needed. Take prior to dental procedures, Disp: , Rfl: 0 .  cycloSPORINE (RESTASIS) 0.05 % ophthalmic emulsion, Place 1 drop 2 (two) times daily into both eyes. , Disp: , Rfl:  .  diltiazem (CARTIA XT) 120 MG 24 hr capsule, Take 120 mg by mouth at bedtime. , Disp: , Rfl:  .  dofetilide (TIKOSYN) 500 MCG capsule, Take 1 capsule (500 mcg total) by mouth 2 (two) times daily., Disp: 60 capsule, Rfl: 6 .  famotidine (PEPCID) 20 MG tablet, Take 1 tablet (20 mg  total) by mouth at bedtime., Disp: 30 tablet, Rfl: 2 .  fluticasone (FLONASE) 50 MCG/ACT nasal spray, Place 1 spray into both nostrils 2 (two) times daily., Disp: , Rfl:  .  HYDROcodone-homatropine (HYDROMET) 5-1.5 MG/5ML syrup, Take 5 mLs by mouth every 6 (six) hours as needed for cough., Disp: 120 mL, Rfl: 0 .  Hypromellose (ARTIFICIAL TEARS OP), Apply 1 drop to eye every 4 (four) hours as needed (dry eyes). , Disp: , Rfl:  .  ipratropium (ATROVENT) 0.06 % nasal spray, Place 2 sprays into both nostrils 4 (four) times daily as needed for rhinitis. , Disp: , Rfl:  .  ipratropium-albuterol (DUONEB) 0.5-2.5 (3) MG/3ML SOLN, Take 3 mLs by nebulization 2 (two) times daily. , Disp: , Rfl:  .  losartan (COZAAR) 100 MG tablet, Take 1 tablet (100 mg total) by mouth daily., Disp: 90 tablet, Rfl: 2 .  montelukast (SINGULAIR) 10 MG tablet, Take 10 mg by mouth at bedtime., Disp: , Rfl:  .  omeprazole (PRILOSEC) 20 MG capsule, Take 20 mg by mouth daily., Disp: , Rfl:  .  potassium chloride SA (K-DUR,KLOR-CON) 20 MEQ tablet, Take 1 tablet (20 mEq total) by mouth daily., Disp: 30 tablet, Rfl: 6 .  sodium chloride (OCEAN) 0.65 % SOLN nasal spray, Place 1 spray into both nostrils 2 (two) times daily. , Disp: , Rfl:  .  triamcinolone ointment (KENALOG) 0.1 %, Apply 1 application topically daily as needed for rash., Disp: , Rfl: 2 .  umeclidinium-vilanterol (ANORO ELLIPTA) 62.5-25 MCG/INH AEPB, Inhale 1 puff into the lungs daily., Disp: 3 each, Rfl: 3 .  WARFARIN SODIUM PO, Take by mouth as directed., Disp: , Rfl:  .  Wheat Dextrin (BENEFIBER ON THE GO) POWD, Take 1 Dose by mouth daily as needed (constipation). , Disp: , Rfl:   Past Medical History: Past Medical History:  Diagnosis Date  . Allergic rhinitis   . Anxiety   . Asthma   . Atrial flutter ([place])   . COPD (chronic obstructive pulmonary disease) ([place])   . Depression   . Essential hypertension   . Hyperlipidemia   . Lumbar disc disease   . Obesity    . OSA (obstructive sleep apnea)    CPAP  . Paroxysmal atrial fibrillation ([facility])    Element of tachycardia bradycardia syndrome  . Respiratory failure ([facility])     Tobacco Use: Social History   Tobacco Use  Smoking Status Never Smoker  Smokeless Tobacco Never Used    Labs: Recent Review Flowsheet Data    There is no flowsheet data to display.      Capillary Blood Glucose: No results found for: GLUCAP   Pulmonary Assessment Scores: Pulmonary Assessment Scores    Row Name 10/01/17 1124         ADL UCSD   ADL Phase  Entry     SOB Score total  46     Rest  0     Walk  9     Stairs  4     Bath  2     Dress  2     Shop  2       CAT Score   CAT Score  19       mMRC Score   mMRC Score  3        Pulmonary Function Assessment: Pulmonary Function Assessment - 10/01/17 1123      Pulmonary Function Tests   FVC%  79 %    FEV1%  78 %    FEV1/FVC Ratio  99    RV%  92 %    DLCO%  81 %      Initial Spirometry Results   FVC%  79 %    FEV1%  78 %    FEV1/FVC Ratio  99      Post Bronchodilator Spirometry Results   FVC%  77 %    FEV1%  83 %    FEV1/FVC Ratio  108      Breath   Bilateral Breath Sounds  Clear    Shortness of Breath  Yes       Exercise Target Goals: Exercise Program Goal: Individual exercise prescription set using results from initial 6 min walk test and THRR while considering  patient's activity barriers and safety.   Exercise Prescription Goal: Initial exercise prescription builds to 30-45 minutes a day of aerobic activity, 2-3 days per week.  Home exercise guidelines will be given to patient during program as part of exercise prescription that the participant will acknowledge.  Activity Barriers & Risk Stratification: Activity Barriers & Cardiac Risk Stratification - 10/01/17 1007      Activity Barriers & Cardiac Risk Stratification   Activity Barriers  Back Problems;Deconditioning;Shortness of Breath;Left Hip Replacement;Right Hip  Replacement;Other (comment)    Comments  Patient great toe and 2nd toes are crossed. has been told by her  doctor not to do activities that make her go up on her toes.     Cardiac Risk Stratification  Moderate       6 Minute Walk: 6 Minute Walk    Row Name 10/01/17 1006         6 Minute Walk   Phase  Initial     Distance  900 feet     Distance % Change  0 %     Distance Feet Change  0 ft     Walk Time  6 minutes     # of Rest Breaks  0     MPH  1.7     METS  2.3     RPE  13     Perceived Dyspnea   14     VO2 Peak  6.24     Symptoms  Yes (comment)     Comments  Patient had to push a wheelchair half way through walk test due to extreme shortness of breath     Resting HR  62 bpm     Resting BP  124/52     Resting Oxygen Saturation   97 %     Exercise Oxygen Saturation  during 6 min walk  98 %     Max Ex. HR  97 bpm     Max Ex. BP  200/62     2 Minute Post BP  154/60        Oxygen Initial Assessment: Oxygen Initial Assessment - 10/01/17 1127      Home Oxygen   Home Oxygen Device  None    Sleep Oxygen Prescription  None    Home Exercise Oxygen Prescription  None    Home at Rest Exercise Oxygen Prescription  None    Compliance with Home Oxygen Use  --   N/A     Initial 6 min Walk   Oxygen Used  None      Program Oxygen Prescription   Program Oxygen Prescription  None       Oxygen Re-Evaluation: Oxygen Re-Evaluation    Row Name 11/04/17 0742 11/24/17 1504 12/16/17 0740 01/06/18 0926 02/03/18 0803     Program Oxygen Prescription   Program Oxygen Prescription  None  None  None  None  None     Home Oxygen   Home Oxygen Device  None  None  None  None  None   Sleep Oxygen Prescription  None  None  None  None  None   Home Exercise Oxygen Prescription  -  None  None  None  None   Home at Rest Exercise Oxygen Prescription  None  None  None  None  None   Compliance with Home Oxygen Use  Yes  Yes  Yes  Yes  Yes     Goals/Expected Outcomes   Short Term Goals  To  learn and understand importance of maintaining oxygen saturations>88%;To learn and understand importance of monitoring SPO2 with pulse oximeter and demonstrate accurate use of the pulse oximeter.;To learn and demonstrate proper pursed lip breathing techniques or other breathing techniques.  To learn and understand importance of maintaining oxygen saturations>88%;To learn and understand importance of monitoring SPO2 with pulse oximeter and demonstrate accurate use of the pulse oximeter.;To learn and demonstrate proper pursed lip breathing techniques or other breathing techniques.  To learn and understand importance of maintaining oxygen saturations>88%;To learn and understand importance of monitoring SPO2 with pulse oximeter and demonstrate accurate use of the pulse oximeter.;To learn  and demonstrate proper pursed lip breathing techniques or other breathing techniques.  To learn and understand importance of maintaining oxygen saturations>88%;To learn and understand importance of monitoring SPO2 with pulse oximeter and demonstrate accurate use of the pulse oximeter.;To learn and demonstrate proper pursed lip breathing techniques or other breathing techniques.  To learn and understand importance of maintaining oxygen saturations>88%;To learn and understand importance of monitoring SPO2 with pulse oximeter and demonstrate accurate use of the pulse oximeter.;To learn and demonstrate proper pursed lip breathing techniques or other breathing techniques.   Long  Term Goals  Maintenance of O2 saturations>88%;Verbalizes importance of monitoring SPO2 with pulse oximeter and return demonstration;Exhibits proper breathing techniques, such as pursed lip breathing or other method taught during program session  Maintenance of O2 saturations>88%;Verbalizes importance of monitoring SPO2 with pulse oximeter and return demonstration;Exhibits proper breathing techniques, such as pursed lip breathing or other method taught during  program session  Maintenance of O2 saturations>88%;Verbalizes importance of monitoring SPO2 with pulse oximeter and return demonstration;Exhibits proper breathing techniques, such as pursed lip breathing or other method taught during program session  Maintenance of O2 saturations>88%;Verbalizes importance of monitoring SPO2 with pulse oximeter and return demonstration;Exhibits proper breathing techniques, such as pursed lip breathing or other method taught during program session  Maintenance of O2 saturations>88%;Verbalizes importance of monitoring SPO2 with pulse oximeter and return demonstration;Exhibits proper breathing techniques, such as pursed lip breathing or other method taught during program session   Comments  Patient verbalizes understanding of maintaining O2 saturation >88% and is able to use pulse oximeter with return demonstration. She exhibits proper pursed lip breathing techniques during exercise.   Patient verbalizes understanding of maintaining O2 saturation >88% and is able to use pulse oximeter with return demonstration. She exhibits proper pursed lip breathing techniques during exercise.   Patient verbalizes understanding of maintaining O2 saturation >88% and is able to use pulse oximeter with return demonstration. She exhibits proper pursed lip breathing techniques during exercise.   Patient verbalizes understanding of maintaining O2 saturation >88% and is able to use pulse oximeter with return demonstration. She exhibits proper pursed lip breathing techniques during exercise.   Patient verbalizes understanding of maintaining O2 saturation >88% and is able to use pulse oximeter with return demonstration. She exhibits proper pursed lip breathing techniques during exercise.    Goals/Expected Outcomes  Patient will continue to meet her short and long term goals.   Patient will continue to meet her short and long term goals.   Patient will continue to meet her short and long term goals.   Patient  will continue to meet her short and long term goals.   Patient will continue to meet her short and long term goals.       Oxygen Discharge (Final Oxygen Re-Evaluation): Oxygen Re-Evaluation - 02/03/18 0803      Program Oxygen Prescription   Program Oxygen Prescription  None      Home Oxygen   Home Oxygen Device  None    Sleep Oxygen Prescription  None    Home Exercise Oxygen Prescription  None    Home at Rest Exercise Oxygen Prescription  None    Compliance with Home Oxygen Use  Yes      Goals/Expected Outcomes   Short Term Goals  To learn and understand importance of maintaining oxygen saturations>88%;To learn and understand importance of monitoring SPO2 with pulse oximeter and demonstrate accurate use of the pulse oximeter.;To learn and demonstrate proper pursed lip breathing techniques or other breathing techniques.  Long  Term Goals  Maintenance of O2 saturations>88%;Verbalizes importance of monitoring SPO2 with pulse oximeter and return demonstration;Exhibits proper breathing techniques, such as pursed lip breathing or other method taught during program session    Comments  Patient verbalizes understanding of maintaining O2 saturation >88% and is able to use pulse oximeter with return demonstration. She exhibits proper pursed lip breathing techniques during exercise.     Goals/Expected Outcomes  Patient will continue to meet her short and long term goals.        Initial Exercise Prescription: Initial Exercise Prescription - 10/01/17 1000      Date of Initial Exercise RX and Referring Provider   Date  10/01/17    Referring Provider  Dr. Annamaria Boots      NuStep   Level  1    SPM  77    Minutes  15    METs  1.8      Arm Ergometer   Level  1.2    Watts  16    RPM  22    Minutes  20    METs  2.2      Prescription Details   Frequency (times per week)  2    Duration  Progress to 30 minutes of continuous aerobic without signs/symptoms of physical distress      Intensity    THRR 40-80% of Max Heartrate  [phone]    Ratings of Perceived Exertion  11-13    Perceived Dyspnea  0-4      Progression   Progression  Continue progressive overload as per policy without signs/symptoms or physical distress.      Resistance Training   Training Prescription  Yes    Weight  1    Reps  10-15       Perform Capillary Blood Glucose checks as needed.  Exercise Prescription Changes: Exercise Prescription Changes    Row Name 10/17/17 1200 11/02/17 0800 11/22/17 0700 12/03/17 1400 12/29/17 1400     Response to Exercise   Blood Pressure (Admit)  144/60  114/60  122/62  -  122/60   Blood Pressure (Exercise)  158/60  148/54  152/70  142/60  144/70   Blood Pressure (Exit)  146/60  118/60  132/62  120/68  115/60   Heart Rate (Admit)  64 bpm  61 bpm  58 bpm  63 bpm  61 bpm   Heart Rate (Exercise)  83 bpm  80 bpm  83 bpm  77 bpm  88 bpm   Heart Rate (Exit)  68 bpm  67 bpm  69 bpm  64 bpm  64 bpm   Oxygen Saturation (Admit)  97 %  98 %  96 %  96 %  96 %   Oxygen Saturation (Exercise)  97 %  96 %  90 %  96 %  97 %   Oxygen Saturation (Exit)  95 %  96 %  97 %  98 %  97 %   Rating of Perceived Exertion (Exercise)  '11  11  11  11  11   ' Perceived Dyspnea (Exercise)  '12  12  11  11  11   ' Duration  Progress to 30 minutes of  aerobic without signs/symptoms of physical distress  Progress to 30 minutes of  aerobic without signs/symptoms of physical distress  Progress to 30 minutes of  aerobic without signs/symptoms of physical distress  Progress to 30 minutes of  aerobic without signs/symptoms of physical distress  Progress to 30 minutes  of  aerobic without signs/symptoms of physical distress   Intensity  THRR New 96-111-127  THRR New 93-110-126  THRR New 92-108-125  THRR unchanged  THRR unchanged     Progression   Progression  Continue to progress workloads to maintain intensity without signs/symptoms of physical distress.  Continue to progress workloads to maintain intensity without  signs/symptoms of physical distress.  Continue to progress workloads to maintain intensity without signs/symptoms of physical distress.  Continue to progress workloads to maintain intensity without signs/symptoms of physical distress.  Continue to progress workloads to maintain intensity without signs/symptoms of physical distress.   Average METs  2.45  2.55  2.8  2.1  2.65     Resistance Training   Training Prescription  Yes  Yes  Yes  Yes  Yes   Weight  '1  2  3  3  3   ' Reps  10-15  10-15  10-15  10-15  10-15   Time  5 Minutes  5 Minutes  5 Minutes  5 Minutes  5 Minutes     NuStep   Level  '1  2  2  1  3   ' SPM  99  100  110  108  101   Minutes  '15  15  17  17  17   ' METs  2.6  2.7  2.9  2.9  2.7     Arm Ergometer   Level  1.2  1.3  1.3  1.3  1.3   Watts  18  19  72  72  22   RPM  23  67  70  70  74   Minutes  '20  20  22  22  22   ' METs  2.3  2.4  2.7  2.7  2.6     Home Exercise Plan   Plans to continue exercise at  Home (comment)  Home (comment)  Home (comment)  Home (comment)  Home (comment)   Frequency  Add 3 additional days to program exercise sessions.  Add 3 additional days to program exercise sessions.  Add 3 additional days to program exercise sessions.  Add 3 additional days to program exercise sessions.  Add 3 additional days to program exercise sessions.   Initial Home Exercises Provided  10/18/17  10/18/17  10/18/17  10/18/17  10/18/17   Row Name 01/18/18 1300             Response to Exercise   Blood Pressure (Admit)  140/58       Blood Pressure (Exercise)  162/64       Blood Pressure (Exit)  130/60       Heart Rate (Admit)  57 bpm       Heart Rate (Exercise)  88 bpm       Heart Rate (Exit)  68 bpm       Oxygen Saturation (Admit)  98 %       Oxygen Saturation (Exercise)  97 %       Oxygen Saturation (Exit)  97 %       Rating of Perceived Exertion (Exercise)  11       Perceived Dyspnea (Exercise)  11       Comments  increase in overall MET level       Duration   Progress to 30 minutes of  aerobic without signs/symptoms of physical distress       Intensity  THRR unchanged  Progression   Progression  Continue to progress workloads to maintain intensity without signs/symptoms of physical distress.       Average METs  2.85         Resistance Training   Training Prescription  Yes       Weight  3       Reps  10-15       Time  5 Minutes         NuStep   Level  3       SPM  103       Minutes  17       METs  2.7         Arm Ergometer   Level  2       Watts  26       RPM  64       Minutes  22       METs  3         Home Exercise Plan   Plans to continue exercise at  Home (comment)       Frequency  Add 3 additional days to program exercise sessions.       Initial Home Exercises Provided  10/18/17          Exercise Comments: Exercise Comments    Row Name 11/02/17 [phone] 11/22/17 0738 12/03/17 1426 12/14/17 0808 01/04/18 1008   Exercise Comments  Patient is breathing better. Able to do more and does feel like the program is helping her achieve her goals.   patient feels like the program is helping her. She has been going to strength training classes at the [facility].   patient feels like the program is helping her. She has been going to strength training classes at the [facility].   Patient feels like the program is helping her. Aside from the recent set back with her neck, she feels she is gaining strength.   Patient is pleased with how the program is helping her and getting her back to being able to be more active in the church.    [place] Name 02/02/18 1447           Exercise Comments  Patient has continued to increase her activity levels outside of rehab. She is busy helping in her church and working out in her yard.           Exercise Goals and Review: Exercise Goals    Row Name 10/01/17 1009             Exercise Goals   Increase Physical Activity  Yes       Intervention  Develop an individualized exercise prescription for aerobic and  resistive training based on initial evaluation findings, risk stratification, comorbidities and participant's personal goals.;Provide advice, education, support and counseling about physical activity/exercise needs.       Expected Outcomes  Short Term: Attend rehab on a regular basis to increase amount of physical activity.       Increase Strength and Stamina  Yes       Intervention  Provide advice, education, support and counseling about physical activity/exercise needs.;Develop an individualized exercise prescription for aerobic and resistive training based on initial evaluation findings, risk stratification, comorbidities and participant's personal goals.       Expected Outcomes  Short Term: Increase workloads from initial exercise prescription for resistance, speed, and METs.       Able to understand and use rate of perceived exertion (RPE) scale  Yes       Intervention  Provide education and explanation on how to use RPE scale       Expected Outcomes  Short Term: Able to use RPE daily in rehab to express subjective intensity level;Long Term:  Able to use RPE to guide intensity level when exercising independently       Able to understand and use Dyspnea scale  Yes       Intervention  Provide education and explanation on how to use Dyspnea scale       Expected Outcomes  Short Term: Able to use Dyspnea scale daily in rehab to express subjective sense of shortness of breath during exertion;Long Term: Able to use Dyspnea scale to guide intensity level when exercising independently       Knowledge and understanding of Target Heart Rate Range (THRR)  Yes       Intervention  Provide education and explanation of THRR including how the numbers were predicted and where they are located for reference       Expected Outcomes  Short Term: Able to state/look up THRR;Short Term: Able to use daily as guideline for intensity in rehab;Long Term: Able to use THRR to govern intensity when exercising independently        Understanding of Exercise Prescription  Yes       Intervention  Provide education, explanation, and written materials on patient's individual exercise prescription       Expected Outcomes  Short Term: Able to explain program exercise prescription;Long Term: Able to explain home exercise prescription to exercise independently          Exercise Goals Re-Evaluation : Exercise Goals Re-Evaluation    Row Name 10/04/17 1440 11/02/17 0840 11/22/17 0735 12/03/17 1425 12/14/17 0805     Exercise Goal Re-Evaluation   Exercise Goals Review  Increase Physical Activity;Increase Strength and Stamina;Able to understand and use rate of perceived exertion (RPE) scale;Able to understand and use Dyspnea scale;Knowledge and understanding of Target Heart Rate Range (THRR);Understanding of Exercise Prescription  Increase Strength and Stamina;Increase Physical Activity;Able to understand and use rate of perceived exertion (RPE) scale;Able to understand and use Dyspnea scale;Understanding of Exercise Prescription;Knowledge and understanding of Target Heart Rate Range (THRR)  Increase Strength and Stamina;Able to understand and use Dyspnea scale;Understanding of Exercise Prescription  Increase Strength and Stamina;Able to understand and use Dyspnea scale;Understanding of Exercise Prescription  Increase Strength and Stamina;Able to understand and use Dyspnea scale;Understanding of Exercise Prescription   Comments  Patient has not yet started PR. Patient will be progressed in time in accordance with her goals.   Patient is progessing at a steady rate. Patient is able to handle increases in weights and equipment workloads.   Patient has said that she has been able to return to the [facility] to attend several fitness classes. she still wants to be able to return to the pool to do water aerobics.   Patient has said that she has been able to return to the [facility] to attend several fitness classes. She still wants to be able to return to the  pool to do water aerobics.   Patient has been out due to having extreme soreness in neck. Has had to see a chriopractor and seek the assistance of massage therapyin an effort to ease the pain and discomfort. She called and said her doctors says she can return with doing everything in a light manner.    Expected Outcomes  Patient wishes to have more energy and  to breathe better and to get back to singing and swimming again   To increase energy, breath better, get back to swimming and be able to sing on the choir again.   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   Get back to swimming and be able to sing on the choir again.   Get back to swimming and be able to sing on the choir again.    [place] Name 01/04/18 1005 02/02/18 1446           Exercise Goal Re-Evaluation   Exercise Goals Review  Increase Strength and Stamina;Able to understand and use Dyspnea scale;Understanding of Exercise Prescription  Increase Strength and Stamina;Able to understand and use Dyspnea scale;Understanding of Exercise Prescription      Comments  Patient has returned and is tolerating execise well. She works hard everyday to increase her distance on each machine and is impressed with her progress thus far.   Patient continues to do well in rehab since returning routinely. She has been experiencing some hip pain, but is still able to work on her machines. We will continue to progress as tolerated.       Expected Outcomes  Increase energy levels and improve shortness of breath.   Improve SOB with activity.          Discharge Exercise Prescription (Final Exercise Prescription Changes): Exercise Prescription Changes - 01/18/18 1300      Response to Exercise   Blood Pressure (Admit)  140/58    Blood Pressure (Exercise)  162/64    Blood Pressure (Exit)  130/60    Heart Rate (Admit)  57 bpm    Heart Rate (Exercise)  88 bpm    Heart Rate (Exit)  68 bpm    Oxygen Saturation  (Admit)  98 %    Oxygen Saturation (Exercise)  97 %    Oxygen Saturation (Exit)  97 %    Rating of Perceived Exertion (Exercise)  11    Perceived Dyspnea (Exercise)  11    Comments  increase in overall MET level    Duration  Progress to 30 minutes of  aerobic without signs/symptoms of physical distress    Intensity  THRR unchanged      Progression   Progression  Continue to progress workloads to maintain intensity without signs/symptoms of physical distress.    Average METs  2.85      Resistance Training   Training Prescription  Yes    Weight  3    Reps  10-15    Time  5 Minutes      NuStep   Level  3    SPM  103    Minutes  17    METs  2.7      Arm Ergometer   Level  2    Watts  26    RPM  64    Minutes  22    METs  3      Home Exercise Plan   Plans to continue exercise at  Home (comment)    Frequency  Add 3 additional days to program exercise sessions.    Initial Home Exercises Provided  10/18/17       Nutrition:  Target Goals: Understanding of nutrition guidelines, daily intake of sodium <1571m, cholesterol <2017m calories 30% from fat and 7% or less from saturated fats, daily to have 5 or more servings of fruits and vegetables.  Biometrics:  Pre Biometrics - 10/01/17 1010      Pre Biometrics   Height  '5\' 3"'  (1.6 m)    Weight  92 kg    Waist Circumference  48 inches    Hip Circumference  39.5 inches    Waist to Hip Ratio  1.22 %    BMI (Calculated)  35.93    Triceps Skinfold  5 mm    % Body Fat  40.2 %    Grip Strength  51.06 kg    Flexibility  0 in    Single Leg Stand  7 seconds        Nutrition Therapy Plan and Nutrition Goals: Nutrition Therapy & Goals - 10/07/17 1503      Personal Nutrition Goals   Nutrition Goal  For heart healthy choices add >50% of whole grains, make half their plate fruits and vegetables. Discuss the difference between starchy vegetables and leafy greens, and how leafy vegetables provide fiber, helps maintain healthy  weight, helps control blood glucose, and lowers cholesterol.  Discuss purchasing fresh or frozen vegetable to reduce sodium and not to add grease, fat or sugar. Consume <18oz of red meat per week. Consume lean cuts of meats and very little of meats high in sodium and nitrates such as pork and lunch meats. Discussed portion control for all food groups.        Intervention Plan   Intervention  Nutrition handout(s) given to patient.    Expected Outcomes  Short Term Goal: Understand basic principles of dietary content, such as calories, fat, sodium, cholesterol and nutrients.       Nutrition Assessments: Nutrition Assessments - 10/01/17 1132      MEDFICTS Scores   Pre Score  54       Nutrition Goals Re-Evaluation: Nutrition Goals Re-Evaluation    Row Name 11/04/17 0752 11/24/17 1505 12/16/17 0740 01/06/18 0926 02/03/18 0803     Goals   Current Weight  202 lb (91.6 kg)  200 lb (90.7 kg)  200 lb (90.7 kg)  202 lb (91.6 kg)  206 lb (93.4 kg)   Nutrition Goal  For heart healthy choices add >50% of whole grains, make half their plate fruits and vegetables. Discuss the difference between starchy vegetables and leafy greens, and how leafy vegetables provide fiber, helps maintain healthy weight, helps control blood glucose, and lowers cholesterol.  Discuss purchasing fresh or frozen vegetable to reduce sodium and not to add grease, fat or sugar. Consume <18oz of red meat per week. Consume lean cuts of meats and very little of meats high in sodium and nitrates such as pork and lunch meats. Discussed portion control for all food groups.    For heart healthy choices add >50% of whole grains, make half their plate fruits and vegetables. Discuss the difference between starchy vegetables and leafy greens, and how leafy vegetables provide fiber, helps maintain healthy weight, helps control blood glucose, and lowers cholesterol.  Discuss purchasing fresh or frozen vegetable to reduce sodium and not to add grease,  fat or sugar. Consume <18oz of red meat per week. Consume lean cuts of meats and very little of meats high in sodium and nitrates such as pork and lunch meats. Discussed portion control for all food groups.    For heart healthy choices add >50% of whole grains, make half their plate fruits and vegetables. Discuss the difference between starchy vegetables and leafy greens, and how leafy vegetables provide fiber, helps maintain healthy weight, helps control blood glucose, and  lowers cholesterol.  Discuss purchasing fresh or frozen vegetable to reduce sodium and not to add grease, fat or sugar. Consume <18oz of red meat per week. Consume lean cuts of meats and very little of meats high in sodium and nitrates such as pork and lunch meats. Discussed portion control for all food groups.    For heart healthy choices add >50% of whole grains, make half their plate fruits and vegetables. Discuss the difference between starchy vegetables and leafy greens, and how leafy vegetables provide fiber, helps maintain healthy weight, helps control blood glucose, and lowers cholesterol.  Discuss purchasing fresh or frozen vegetable to reduce sodium and not to add grease, fat or sugar. Consume <18oz of red meat per week. Consume lean cuts of meats and very little of meats high in sodium and nitrates such as pork and lunch meats. Discussed portion control for all food groups.    For heart healthy choices add >50% of whole grains, make half their plate fruits and vegetables. Discuss the difference between starchy vegetables and leafy greens, and how leafy vegetables provide fiber, helps maintain healthy weight, helps control blood glucose, and lowers cholesterol.  Discuss purchasing fresh or frozen vegetable to reduce sodium and not to add grease, fat or sugar. Consume <18oz of red meat per week. Consume lean cuts of meats and very little of meats high in sodium and nitrates such as pork and lunch meats. Discussed portion control for all  food groups.     Comment  Patient says she is eating heart healthy and is working toward meeting her nutritional goals.   Patient has lost 2 lbs since last 30 day review. She says she is continuing to eat healthy. Will continue to monitor.   Patient has maintained her weight since last 30 day review. She says she is continuing to eat healthy. Will continue to monitor.   Patient has gained 2 lbs since last 30 day review. She continues to say she is trying to eat healthy. Will continue to monitor.   Patient has gained 4 lbs since last 30 day review. She says she is trying to eat healthy but it has been hard recently with all the fall festivities she has been attending.    Expected Outcome  Patient will continue to work toward meeting her nutritional needes. Will continue to monitor for progress.   Patient will continue to work toward meeting her nutritional needes. Will continue to monitor for progress.   Patient will continue to work toward meeting her nutritional needes. Will continue to monitor for progress.   Patient will continue to work toward meeting her nutritional needes. Will continue to monitor for progress.   Patient will continue to work toward meeting her nutritional needes. Will continue to monitor for progress.      Personal Goal #2 Re-Evaluation   Personal Goal #2  Patient is working on eating heart healthy. Handout given for her to improve on her food choices.   Patient is working on eating heart healthy. Handout given for her to improve on her food choices.   Patient is working on eating heart healthy. Handout given for her to improve on her food choices.   -  -      Nutrition Goals Discharge (Final Nutrition Goals Re-Evaluation): Nutrition Goals Re-Evaluation - 02/03/18 0803      Goals   Current Weight  206 lb (93.4 kg)    Nutrition Goal  For heart healthy choices add >50% of whole grains, make  half their plate fruits and vegetables. Discuss the difference between starchy vegetables  and leafy greens, and how leafy vegetables provide fiber, helps maintain healthy weight, helps control blood glucose, and lowers cholesterol.  Discuss purchasing fresh or frozen vegetable to reduce sodium and not to add grease, fat or sugar. Consume <18oz of red meat per week. Consume lean cuts of meats and very little of meats high in sodium and nitrates such as pork and lunch meats. Discussed portion control for all food groups.      Comment  Patient has gained 4 lbs since last 30 day review. She says she is trying to eat healthy but it has been hard recently with all the fall festivities she has been attending.     Expected Outcome  Patient will continue to work toward meeting her nutritional needes. Will continue to monitor for progress.        Psychosocial: Target Goals: Acknowledge presence or absence of significant depression and/or stress, maximize coping skills, provide positive support system. Participant is able to verbalize types and ability to use techniques and skills needed for reducing stress and depression.  Initial Review & Psychosocial Screening: Initial Psych Review & Screening - 10/01/17 1134      Initial Review   Current issues with  None Identified      Family Dynamics   Good Support System?  Yes      Barriers   Psychosocial barriers to participate in program  There are no identifiable barriers or psychosocial needs.      Screening Interventions   Interventions  Encouraged to exercise    Expected Outcomes  Short Term goal: Identification and review with participant of any Quality of Life or Depression concerns found by scoring the questionnaire.;Long Term goal: The participant improves quality of Life and PHQ9 Scores as seen by post scores and/or verbalization of changes       Quality of Life Scores: Quality of Life - 10/01/17 1010      Quality of Life   Select  Quality of Life      Quality of Life Scores   Health/Function Pre  24.4 %    Socioeconomic Pre   26.75 %    Psych/Spiritual Pre  24.86 %    Family Pre  18 %    GLOBAL Pre  24.55 %      Scores of 19 and below usually indicate a poorer quality of life in these areas.  A difference of  2-3 points is a clinically meaningful difference.  A difference of 2-3 points in the total score of the Quality of Life Index has been associated with significant improvement in overall quality of life, self-image, physical symptoms, and general health in studies assessing change in quality of life.   PHQ-9: Recent Review Flowsheet Data    Depression screen [facility] 2/9 10/01/2017   Decreased Interest 0   Down, Depressed, Hopeless 0   PHQ - 2 Score 0   Altered sleeping 2   Tired, decreased energy 2   Change in appetite 2   Feeling bad or failure about yourself  0   Trouble concentrating 0   Moving slowly or fidgety/restless 0   Suicidal thoughts 0   PHQ-9 Score 6   Difficult doing work/chores Not difficult at all     Interpretation of Total Score  Total Score Depression Severity:  1-4 = Minimal depression, 5-9 = Mild depression, 10-14 = Moderate depression, 15-19 = Moderately severe depression, 20-27 = Severe depression  Psychosocial Evaluation and Intervention: Psychosocial Evaluation - 10/01/17 1135      Psychosocial Evaluation & Interventions   Interventions  Encouraged to exercise with the program and follow exercise prescription    Continue Psychosocial Services   No Follow up required       Psychosocial Re-Evaluation: Psychosocial Re-Evaluation    [place] Name 11/04/17 0749 11/24/17 1508 12/16/17 0744 01/06/18 0929 02/03/18 0807     Psychosocial Re-Evaluation   Current issues with  None Identified  None Identified  None Identified  None Identified  None Identified   Comments  Patient initial QOL score was 18 and her PHQ-9 score was 6 with no psychosocial issues identified at orientation. Will continue to monitor.  Patient initial QOL score was 18 and her PHQ-9 score was 6 with no psychosocial  issues identified at orientation. Will continue to monitor.  Patient initial QOL score was 18 and her PHQ-9 score was 6 with no psychosocial issues identified. Will continue to monitor.  Patient initial QOL score was 18 and her PHQ-9 score was 6 with no psychosocial issues identified. Will continue to monitor.  Patient initial QOL score was 18 and her PHQ-9 score was 6 with no psychosocial issues identified. Will continue to monitor.   Expected Outcomes  Patient will have no psychosocial issues identified at discharge.   Patient will have no psychosocial issues identified at discharge.   Patient will have no psychosocial issues identified at discharge.   Patient will have no psychosocial issues identified at discharge.   Patient will have no psychosocial issues identified at discharge.    Interventions  Relaxation education;Encouraged to attend Pulmonary Rehabilitation for the exercise;Stress management education  Relaxation education;Encouraged to attend Pulmonary Rehabilitation for the exercise;Stress management education  Relaxation education;Encouraged to attend Pulmonary Rehabilitation for the exercise;Stress management education  Relaxation education;Encouraged to attend Pulmonary Rehabilitation for the exercise;Stress management education  Relaxation education;Encouraged to attend Pulmonary Rehabilitation for the exercise;Stress management education   Continue Psychosocial Services   No Follow up required  No Follow up required  No Follow up required  No Follow up required  No Follow up required      Psychosocial Discharge (Final Psychosocial Re-Evaluation): Psychosocial Re-Evaluation - 02/03/18 0807      Psychosocial Re-Evaluation   Current issues with  None Identified    Comments  Patient initial QOL score was 18 and her PHQ-9 score was 6 with no psychosocial issues identified. Will continue to monitor.    Expected Outcomes  Patient will have no psychosocial issues identified at discharge.      Interventions  Relaxation education;Encouraged to attend Pulmonary Rehabilitation for the exercise;Stress management education    Continue Psychosocial Services   No Follow up required        Education: Education Goals: Education classes will be provided on a weekly basis, covering required topics. Participant will state understanding/return demonstration of topics presented.  Learning Barriers/Preferences: Learning Barriers/Preferences - 10/01/17 1020      Learning Barriers/Preferences   Learning Barriers  None    Learning Preferences  Skilled Demonstration;Individual Instruction;Group Instruction       Education Topics: How Lungs Work and Diseases: - Discuss the anatomy of the lungs and diseases that can affect the lungs, such as COPD.   PULMONARY REHAB OTHER RESPIRATORY from 01/20/2018 in [place]  Date  11/04/17  Educator  DC  Instruction Review Code  2- Demonstrated Understanding      Exercise: -Discuss the importance of exercise, FITT principles  of exercise, normal and abnormal responses to exercise, and how to exercise safely.   Environmental Irritants: -Discuss types of environmental irritants and how to limit exposure to environmental irritants.   PULMONARY REHAB OTHER RESPIRATORY from 01/20/2018 in [place]  Date  11/11/17  Educator  DWynetta Emery  Instruction Review Code  2- Demonstrated Understanding      Meds/Inhalers and oxygen: - Discuss respiratory medications, definition of an inhaler and oxygen, and the proper way to use an inhaler and oxygen.   PULMONARY REHAB OTHER RESPIRATORY from 01/20/2018 in [place]  Date  11/18/17  Educator  D. Wynetta Emery      Energy Saving Techniques: - Discuss methods to conserve energy and decrease shortness of breath when performing activities of daily living.    PULMONARY REHAB OTHER RESPIRATORY from 01/20/2018 in [place]   Date  11/25/17  Educator  Etheleen Mayhew  Instruction Review Code  2- Demonstrated Understanding      Bronchial Hygiene / Breathing Techniques: - Discuss breathing mechanics, pursed-lip breathing technique,  proper posture, effective ways to clear airways, and other functional breathing techniques   Cleaning Equipment: - Provides group verbal and written instruction about the health risks of elevated stress, cause of high stress, and healthy ways to reduce stress.   Nutrition I: Fats: - Discuss the types of cholesterol, what cholesterol does to the body, and how cholesterol levels can be controlled.   PULMONARY REHAB OTHER RESPIRATORY from 01/20/2018 in [place]  Date  12/16/17  Educator  Etheleen Mayhew  Instruction Review Code  2- Demonstrated Understanding      Nutrition II: Labels: -Discuss the different components of food labels and how to read food labels.   PULMONARY REHAB OTHER RESPIRATORY from 01/20/2018 in [place]  Date  12/23/17  Educator  Etheleen Mayhew  Instruction Review Code  2- Demonstrated Understanding      Respiratory Infections: - Discuss the signs and symptoms of respiratory infections, ways to prevent respiratory infections, and the importance of seeking medical treatment when having a respiratory infection.   PULMONARY REHAB OTHER RESPIRATORY from 01/20/2018 in [place]  Date  01/06/18  Educator  DWynetta Emery  Instruction Review Code  2- Demonstrated Understanding      Stress I: Signs and Symptoms: - Discuss the causes of stress, how stress may lead to anxiety and depression, and ways to limit stress.   PULMONARY REHAB OTHER RESPIRATORY from 01/20/2018 in [place]  Date  10/07/17  Educator  DC  Instruction Review Code  2- Demonstrated Understanding      Stress II: Relaxation: -Discuss relaxation techniques to limit stress.   PULMONARY REHAB OTHER  RESPIRATORY from 01/20/2018 in [place]  Date  10/14/17  Educator  DC  Instruction Review Code  2- Demonstrated Understanding      Oxygen for Home/Travel: - Discuss how to prepare for travel when on oxygen and proper ways to transport and store oxygen to ensure safety.   Knowledge Questionnaire Score: Knowledge Questionnaire Score - 10/01/17 1118      Knowledge Questionnaire Score   Pre Score  14/18       Core Components/Risk Factors/Patient Goals at Admission: Personal Goals and Risk Factors at Admission - 10/01/17 1132      Core Components/Risk Factors/Patient Goals on Admission    Weight Management  Obesity    Personal Goal Other  Yes  Personal Goal  Have more energy, breath better, get back to swimming, sing on the choir again.     Intervention  Attend CR 3 x week and supplement with at home exercise 2 x week.     Expected Outcomes  Achieve personal goals.        Core Components/Risk Factors/Patient Goals Review:  Goals and Risk Factor Review    Row Name 11/04/17 0745 11/24/17 1506 12/16/17 0741 01/06/18 0927 02/03/18 0805     Core Components/Risk Factors/Patient Goals Review   Personal Goals Review  Weight Management/Obesity;Improve shortness of breath with ADL's;Develop more efficient breathing techniques such as purse lipped breathing and diaphragmatic breathing and practicing self-pacing with activity. More energy; breathe better; get back to swimming; sing again.  Weight Management/Obesity;Improve shortness of breath with ADL's;Develop more efficient breathing techniques such as purse lipped breathing and diaphragmatic breathing and practicing self-pacing with activity. More energy; breathe better; get back to swimming; sing again.   Weight Management/Obesity;Improve shortness of breath with ADL's;Develop more efficient breathing techniques such as purse lipped breathing and diaphragmatic breathing and practicing self-pacing with activity. More  energy; breathe better; get back to swimming; sing again.   Weight Management/Obesity;Improve shortness of breath with ADL's;Develop more efficient breathing techniques such as purse lipped breathing and diaphragmatic breathing and practicing self-pacing with activity. More energy; breathe better; get back to swimming; sing again.   Weight Management/Obesity;Improve shortness of breath with ADL's;Develop more efficient breathing techniques such as purse lipped breathing and diaphragmatic breathing and practicing self-pacing with activity. More energy; breathe better; get back to swimming; sing again.    Review  Patient has completed 8 sessions maintaining her weight. She is doing well in the program with some progression. She says she does feel her energy has increased. She is able to demonstrate proper pursed lip breathing techniques in the program during exercise. She does not see any improvement in her breathing yet but hopes to continue to improve as she continues the program. Will continue to monitor for progress.   Patient has completed 13 sessions losing 2 lbs since last 30 day review. She continues to do well in the program with progressions. She continues to say she feels stronger and feels she is less SOB with activities. She is now able to participate in 2 classes at the [facility] and is planning to join a group of women that walk in her neighborhood. She is very pleased with her progress and hopes to see even more improvements as she continues. Will continue to monitor for progress.   Patient has completed 15 sessions maintaining her weight. She has only attended 2 sessions in this 30 day review due to pain in her back and shoulders. She was evaluated by her pcp who says she pulled a muscle. She continues to do well in the program and will hopefully be able to be more consistent in her attendance. She hopes to get back to her classes at the [facility]. Will continue to monitor for progress.   Patient has completed  20 sessions gaining 2 lbs since last 30 day review. She continues to do well in the program with progression. Her back and shoulders have healed. She says she feels stronger and has more energy. She is also breathing better and building upper body strength. She is going to classes at the [facility] but has not started swimming yet. Will continue to monitor for progress.   Patient has completed 27 sessions gaining 4 lbs since last 30 day review.  She continues to do well in the program with progression. She says she feels so much stronger and has more energy. She is able to do things now without "giving out" or having to stop and rest. She is less SOB and she continues her classes at the [facility]. She has not started back swimming and says she may not return to swimming due to ear problems. She is very pleased with her progress. Will continue to monitor.    Expected Outcomes  Patient will continue to attend sessions and complete the program meeting her personal goals.   Patient will continue to attend sessions and complete the program meeting her personal goals.   Patient will continue to attend sessions and complete the program meeting her personal goals.   Patient will continue to attend sessions and complete the program meeting her personal goals.   Patient will continue to attend sessions and complete the program meeting her personal goals.       Core Components/Risk Factors/Patient Goals at Discharge (Final Review):  Goals and Risk Factor Review - 02/03/18 0805      Core Components/Risk Factors/Patient Goals Review   Personal Goals Review  Weight Management/Obesity;Improve shortness of breath with ADL's;Develop more efficient breathing techniques such as purse lipped breathing and diaphragmatic breathing and practicing self-pacing with activity.   More energy; breathe better; get back to swimming; sing again.    Review  Patient has completed 27 sessions gaining 4 lbs since last 30 day review. She continues to do  well in the program with progression. She says she feels so much stronger and has more energy. She is able to do things now without "giving out" or having to stop and rest. She is less SOB and she continues her classes at the [facility]. She has not started back swimming and says she may not return to swimming due to ear problems. She is very pleased with her progress. Will continue to monitor.     Expected Outcomes  Patient will continue to attend sessions and complete the program meeting her personal goals.        ITP Comments: ITP Comments    Row Name 10/06/17 1353 10/07/17 1504         ITP Comments  Patient new to program. She has completed 2 sessions. Will continue to monitor for progress.   Patient attended the [facility] class with hospital chaplian to discuss how this event has impacted their life.          Comments: ITP REVIEW Pt is making expected progress toward pulmonary rehab goals after completing 27 sessions. Recommend continued exercise, life style modification, education, and utilization of breathing techniques to increase stamina and strength and decrease shortness of breath with exertion.

## 2018-02-03 NOTE — Progress Notes (Signed)
Daily Session Note  Patient Details  Name: [name] [name] MRN: [id number] Date of Birth: Jan 19, 1941 Referring Provider:     PULMONARY REHAB OTHER RESPIRATORY from 10/01/2017 in [place]  Referring Provider  Dr. Annamaria Boots      Encounter Date: 02/03/2018  Check In: Session Check In - 02/03/18 1330      Check-In   Supervising physician immediately available to respond to emergencies  See telemetry face sheet for immediately available MD    Location  [facility]    Staff Present  Benay Pike, Exercise Physiologist;Shaianne Nucci Wynetta Emery, RN, BSN    Medication changes reported      No    Fall or balance concerns reported     No    Warm-up and Cool-down  Performed as group-led instruction    Resistance Training Performed  Yes    VAD Patient?  No    PAD/SET Patient?  No      Pain Assessment   Currently in Pain?  No/denies    Pain Score  0-No pain    Multiple Pain Sites  No       Capillary Blood Glucose: No results found for this or any previous visit (from the past 24 hour(s)).    Social History   Tobacco Use  Smoking Status Never Smoker  Smokeless Tobacco Never Used    Goals Met:  Proper associated with RPD/PD & O2 Sat Independence with exercise equipment Improved SOB with ADL's Using PLB without cueing & demonstrates good technique Exercise tolerated well No report of cardiac concerns or symptoms Strength training completed today  Goals Unmet:  Not Applicable  Comments: Pt able to follow exercise prescription today without complaint.  Will continue to monitor for progression. Check out 1430.   Dr. Sinda Du is Medical Director for [facility].

## 2018-02-08 ENCOUNTER — Encounter (HOSPITAL_COMMUNITY)
Admission: RE | Admit: 2018-02-08 | Discharge: 2018-02-08 | Disposition: A | Discharge status: Home / Self Care | Payer: Medicare Other | Source: Ambulatory Visit | Attending: Internal Medicine | Admitting: Internal Medicine

## 2018-02-08 DIAGNOSIS — F419 Anxiety disorder, unspecified: Secondary | ICD-10-CM | POA: Diagnosis not present

## 2018-02-08 DIAGNOSIS — I480 Paroxysmal atrial fibrillation: Secondary | ICD-10-CM | POA: Diagnosis not present

## 2018-02-08 DIAGNOSIS — G4733 Obstructive sleep apnea (adult) (pediatric): Secondary | ICD-10-CM | POA: Diagnosis not present

## 2018-02-08 DIAGNOSIS — F329 Major depressive disorder, single episode, unspecified: Secondary | ICD-10-CM | POA: Diagnosis not present

## 2018-02-08 DIAGNOSIS — J4550 Severe persistent asthma, uncomplicated: Secondary | ICD-10-CM | POA: Diagnosis not present

## 2018-02-08 DIAGNOSIS — I4892 Unspecified atrial flutter: Secondary | ICD-10-CM | POA: Diagnosis not present

## 2018-02-08 NOTE — Progress Notes (Signed)
Daily Session Note  Patient Details  Name: [name] [name] MRN: [id number] Date of Birth: 12-29-1940 Referring Provider:     PULMONARY REHAB OTHER RESPIRATORY from 10/01/2017 in [place]  Referring Provider  Dr. Annamaria Boots      Encounter Date: 02/08/2018  Check In: Session Check In - 02/08/18 1330      Check-In   Supervising physician immediately available to respond to emergencies  See telemetry face sheet for immediately available MD    Location  [facility]    Staff Present  Russella Dar, MS, EP, [facility], Exercise Physiologist;Cosby Proby Zachery Conch, Exercise Physiologist    Medication changes reported      No    Fall or balance concerns reported     No    Warm-up and Cool-down  Performed as group-led instruction    Resistance Training Performed  Yes    VAD Patient?  No    PAD/SET Patient?  No      Pain Assessment   Currently in Pain?  No/denies    Pain Score  0-No pain    Multiple Pain Sites  No       Capillary Blood Glucose: No results found for this or any previous visit (from the past 24 hour(s)).    Social History   Tobacco Use  Smoking Status Never Smoker  Smokeless Tobacco Never Used    Goals Met:  Proper associated with RPD/PD & O2 Sat Independence with exercise equipment Using PLB without cueing & demonstrates good technique Exercise tolerated well No report of cardiac concerns or symptoms Strength training completed today  Goals Unmet:  Not Applicable  Comments: Pt able to follow exercise prescription today without complaint.  Will continue to monitor for progression. Check out 2:30.   Dr. Sinda Du is Medical Director for [facility].

## 2018-02-10 ENCOUNTER — Encounter (HOSPITAL_COMMUNITY)
Admission: RE | Admit: 2018-02-10 | Discharge: 2018-02-10 | Disposition: A | Discharge status: Home / Self Care | Payer: Medicare Other | Source: Ambulatory Visit | Attending: Internal Medicine | Admitting: Internal Medicine

## 2018-02-10 DIAGNOSIS — F329 Major depressive disorder, single episode, unspecified: Secondary | ICD-10-CM | POA: Diagnosis not present

## 2018-02-10 DIAGNOSIS — J4550 Severe persistent asthma, uncomplicated: Secondary | ICD-10-CM | POA: Diagnosis not present

## 2018-02-10 DIAGNOSIS — F419 Anxiety disorder, unspecified: Secondary | ICD-10-CM | POA: Diagnosis not present

## 2018-02-10 DIAGNOSIS — G4733 Obstructive sleep apnea (adult) (pediatric): Secondary | ICD-10-CM | POA: Diagnosis not present

## 2018-02-10 DIAGNOSIS — I4892 Unspecified atrial flutter: Secondary | ICD-10-CM | POA: Diagnosis not present

## 2018-02-10 DIAGNOSIS — I480 Paroxysmal atrial fibrillation: Secondary | ICD-10-CM | POA: Diagnosis not present

## 2018-02-10 NOTE — Progress Notes (Signed)
Daily Session Note  Patient Details  Name: [name] [name] MRN: [id number] Date of Birth: [birth year] Referring Provider:     PULMONARY REHAB OTHER RESPIRATORY from 10/01/2017 in [place]  Referring Provider  Dr. Annamaria Boots      Encounter Date: 02/10/2018  Check In: Session Check In - 02/10/18 1330      Check-In   Supervising physician immediately available to respond to emergencies  See telemetry face sheet for immediately available MD    Location  [facility]    Staff Present  Benay Pike, Exercise Physiologist;Other    Medication changes reported      No    Fall or balance concerns reported     No    Warm-up and Cool-down  Performed as group-led instruction    Resistance Training Performed  Yes    VAD Patient?  No    PAD/SET Patient?  No      Pain Assessment   Currently in Pain?  No/denies    Pain Score  0-No pain    Multiple Pain Sites  No       Capillary Blood Glucose: No results found for this or any previous visit (from the past 24 hour(s)).    Social History   Tobacco Use  Smoking Status Never Smoker  Smokeless Tobacco Never Used    Goals Met:  Proper associated with RPD/PD & O2 Sat Independence with exercise equipment Using PLB without cueing & demonstrates good technique Exercise tolerated well No report of cardiac concerns or symptoms Strength training completed today  Goals Unmet:  Not Applicable  Comments: Pt able to follow exercise prescription today without complaint.  Will continue to monitor for progression. Check out 2:30.   Dr. Sinda Du is Medical Director for [facility] Pulmonary Rehab.

## 2018-02-11 ENCOUNTER — Ambulatory Visit: Payer: Medicare Other | Admitting: Cardiology

## 2018-02-14 ENCOUNTER — Encounter (HOSPITAL_COMMUNITY): Payer: Self-pay

## 2018-02-15 ENCOUNTER — Encounter (HOSPITAL_COMMUNITY)
Admission: RE | Admit: 2018-02-15 | Discharge: 2018-02-15 | Disposition: A | Discharge status: Home / Self Care | Payer: Medicare Other | Source: Ambulatory Visit | Attending: Internal Medicine | Admitting: Internal Medicine

## 2018-02-15 DIAGNOSIS — F419 Anxiety disorder, unspecified: Secondary | ICD-10-CM | POA: Diagnosis not present

## 2018-02-15 DIAGNOSIS — J4550 Severe persistent asthma, uncomplicated: Secondary | ICD-10-CM | POA: Diagnosis not present

## 2018-02-15 DIAGNOSIS — I4892 Unspecified atrial flutter: Secondary | ICD-10-CM | POA: Diagnosis not present

## 2018-02-15 DIAGNOSIS — M9902 Segmental and somatic dysfunction of thoracic region: Secondary | ICD-10-CM | POA: Diagnosis not present

## 2018-02-15 DIAGNOSIS — M545 Low back pain: Secondary | ICD-10-CM | POA: Diagnosis not present

## 2018-02-15 DIAGNOSIS — M9905 Segmental and somatic dysfunction of pelvic region: Secondary | ICD-10-CM | POA: Diagnosis not present

## 2018-02-15 DIAGNOSIS — G4733 Obstructive sleep apnea (adult) (pediatric): Secondary | ICD-10-CM | POA: Diagnosis not present

## 2018-02-15 DIAGNOSIS — M542 Cervicalgia: Secondary | ICD-10-CM | POA: Diagnosis not present

## 2018-02-15 DIAGNOSIS — F329 Major depressive disorder, single episode, unspecified: Secondary | ICD-10-CM | POA: Diagnosis not present

## 2018-02-15 DIAGNOSIS — M9903 Segmental and somatic dysfunction of lumbar region: Secondary | ICD-10-CM | POA: Diagnosis not present

## 2018-02-15 DIAGNOSIS — I480 Paroxysmal atrial fibrillation: Secondary | ICD-10-CM | POA: Diagnosis not present

## 2018-02-15 DIAGNOSIS — M9901 Segmental and somatic dysfunction of cervical region: Secondary | ICD-10-CM | POA: Diagnosis not present

## 2018-02-15 DIAGNOSIS — M546 Pain in thoracic spine: Secondary | ICD-10-CM | POA: Diagnosis not present

## 2018-02-15 NOTE — Progress Notes (Signed)
Daily Session Note  Patient Details  Name: Ahsha L Germond MRN: [id number] Date of Birth: 12/15/1940 Referring Provider:     PULMONARY REHAB OTHER RESPIRATORY from 10/01/2017 in [place] [facility]  Referring Provider  Dr. Young      Encounter Date: 02/15/2018  Check In: Session Check In - 02/15/18 1330      Check-In   Supervising physician immediately available to respond to emergencies  See telemetry face sheet for immediately available MD    Location  [facility]    Staff Present  Diane Coad, MS, EP, [facility], Exercise Physiologist;Amanda Ballard, Exercise Physiologist    Medication changes reported      No    Fall or balance concerns reported     No    Warm-up and Cool-down  Performed as group-led instruction    Resistance Training Performed  Yes    VAD Patient?  No    PAD/SET Patient?  No      Pain Assessment   Currently in Pain?  No/denies    Pain Score  0-No pain    Multiple Pain Sites  No       Capillary Blood Glucose: No results found for this or any previous visit (from the past 24 hour(s)).    Social History   Tobacco Use  Smoking Status Never Smoker  Smokeless Tobacco Never Used    Goals Met:  Proper associated with RPD/PD & O2 Sat Independence with exercise equipment Using PLB without cueing & demonstrates good technique Exercise tolerated well No report of cardiac concerns or symptoms Strength training completed today  Goals Unmet:  Not Applicable  Comments: Pt able to follow exercise prescription today without complaint.  Will continue to monitor for progression. Check out 2:30.   Dr. Edward Hawkins is Medical Director for [place] [facility]. 

## 2018-02-16 ENCOUNTER — Ambulatory Visit (INDEPENDENT_AMBULATORY_CARE_PROVIDER_SITE_OTHER): Payer: Medicare Other

## 2018-02-16 DIAGNOSIS — J4550 Severe persistent asthma, uncomplicated: Secondary | ICD-10-CM

## 2018-02-16 MED ORDER — MEPOLIZUMAB 100 MG ~~LOC~~ SOLR
100.0000 mg | SUBCUTANEOUS | Status: DC
  Administered 2018-02-16: 100 mg via SUBCUTANEOUS

## 2018-02-16 NOTE — Progress Notes (Signed)
Documentation of medication administration and charges of Nucala have been completed by Remie Mathison, CMA based on the hand written Nucala documentation sheet completed by Tammy Scott, who administered the medication.  

## 2018-02-17 ENCOUNTER — Encounter (HOSPITAL_COMMUNITY)
Admission: RE | Admit: 2018-02-17 | Discharge: 2018-02-17 | Disposition: A | Discharge status: Home / Self Care | Payer: Medicare Other | Source: Ambulatory Visit | Attending: Internal Medicine | Admitting: Internal Medicine

## 2018-02-17 DIAGNOSIS — G4733 Obstructive sleep apnea (adult) (pediatric): Secondary | ICD-10-CM | POA: Diagnosis not present

## 2018-02-17 DIAGNOSIS — J4550 Severe persistent asthma, uncomplicated: Secondary | ICD-10-CM

## 2018-02-17 DIAGNOSIS — I4892 Unspecified atrial flutter: Secondary | ICD-10-CM | POA: Diagnosis not present

## 2018-02-17 DIAGNOSIS — I480 Paroxysmal atrial fibrillation: Secondary | ICD-10-CM | POA: Diagnosis not present

## 2018-02-17 DIAGNOSIS — F329 Major depressive disorder, single episode, unspecified: Secondary | ICD-10-CM | POA: Diagnosis not present

## 2018-02-17 DIAGNOSIS — F419 Anxiety disorder, unspecified: Secondary | ICD-10-CM | POA: Diagnosis not present

## 2018-02-17 NOTE — Progress Notes (Signed)
Daily Session Note  Patient Details  Name: [name] [name] MRN: [id number] Date of Birth: 08-17-1940 Referring Provider:     PULMONARY REHAB OTHER RESPIRATORY from 10/01/2017 in [place]  Referring Provider  Dr. Annamaria Boots      Encounter Date: 02/17/2018  Check In: Session Check In - 02/17/18 1330      Check-In   Supervising physician immediately available to respond to emergencies  See telemetry face sheet for immediately available MD    Location  [facility]    Staff Present  Benay Pike, Exercise Physiologist;Debra Wynetta Emery, RN, BSN    Medication changes reported      No    Fall or balance concerns reported     No    Warm-up and Cool-down  Performed as group-led instruction    Resistance Training Performed  Yes    VAD Patient?  No    PAD/SET Patient?  No      Pain Assessment   Currently in Pain?  Yes    Pain Score  7     Pain Location  Hip    Pain Orientation  Right;Left    Pain Descriptors / Indicators  Aching    Pain Type  Acute pain    Pain Radiating Towards  No Radiation     Pain Onset  1 to 4 weeks ago    Pain Frequency  Occasional    Aggravating Factors   Walking, certain movements.     Pain Relieving Factors  Rest and baclofen.     Effect of Pain on Daily Activities  limits mobility in lower extremities.     Multiple Pain Sites  No       Capillary Blood Glucose: No results found for this or any previous visit (from the past 24 hour(s)).    Social History   Tobacco Use  Smoking Status Never Smoker  Smokeless Tobacco Never Used    Goals Met:  Proper associated with RPD/PD & O2 Sat Independence with exercise equipment Improved SOB with ADL's Using PLB without cueing & demonstrates good technique Exercise tolerated well No report of cardiac concerns or symptoms Strength training completed today  Goals Unmet:  Not Applicable  Comments: Pt able to follow exercise prescription today without complaint.  Will  continue to monitor for progression. Check out 1430.   Dr. Sinda Du is Medical Director for [facility].

## 2018-02-18 ENCOUNTER — Other Ambulatory Visit (HOSPITAL_COMMUNITY): Payer: Self-pay | Admitting: Internal Medicine

## 2018-02-18 DIAGNOSIS — Z1231 Encounter for screening mammogram for malignant neoplasm of breast: Secondary | ICD-10-CM

## 2018-02-22 ENCOUNTER — Encounter (HOSPITAL_COMMUNITY): Payer: Medicare Other

## 2018-02-22 ENCOUNTER — Other Ambulatory Visit (HOSPITAL_COMMUNITY): Payer: Self-pay | Admitting: *Deleted

## 2018-02-22 DIAGNOSIS — Z299 Encounter for prophylactic measures, unspecified: Secondary | ICD-10-CM | POA: Diagnosis not present

## 2018-02-22 DIAGNOSIS — M2012 Hallux valgus (acquired), left foot: Secondary | ICD-10-CM | POA: Diagnosis not present

## 2018-02-22 DIAGNOSIS — M2062 Acquired deformities of toe(s), unspecified, left foot: Secondary | ICD-10-CM | POA: Insufficient documentation

## 2018-02-22 DIAGNOSIS — R600 Localized edema: Secondary | ICD-10-CM | POA: Diagnosis not present

## 2018-02-22 DIAGNOSIS — I4891 Unspecified atrial fibrillation: Secondary | ICD-10-CM | POA: Diagnosis not present

## 2018-02-22 DIAGNOSIS — R350 Frequency of micturition: Secondary | ICD-10-CM | POA: Diagnosis not present

## 2018-02-22 DIAGNOSIS — B373 Candidiasis of vulva and vagina: Secondary | ICD-10-CM | POA: Diagnosis not present

## 2018-02-22 DIAGNOSIS — Z6837 Body mass index (BMI) 37.0-37.9, adult: Secondary | ICD-10-CM | POA: Diagnosis not present

## 2018-02-22 DIAGNOSIS — I10 Essential (primary) hypertension: Secondary | ICD-10-CM | POA: Diagnosis not present

## 2018-02-22 MED ORDER — DOFETILIDE 500 MCG PO CAPS: 500.0000 ug | ORAL_CAPSULE | Freq: Two times a day (BID) | ORAL | 3 refills | Status: DC

## 2018-02-23 DIAGNOSIS — M546 Pain in thoracic spine: Secondary | ICD-10-CM | POA: Diagnosis not present

## 2018-02-23 DIAGNOSIS — M542 Cervicalgia: Secondary | ICD-10-CM | POA: Diagnosis not present

## 2018-02-23 DIAGNOSIS — M9903 Segmental and somatic dysfunction of lumbar region: Secondary | ICD-10-CM | POA: Diagnosis not present

## 2018-02-23 DIAGNOSIS — M9905 Segmental and somatic dysfunction of pelvic region: Secondary | ICD-10-CM | POA: Diagnosis not present

## 2018-02-23 DIAGNOSIS — M9901 Segmental and somatic dysfunction of cervical region: Secondary | ICD-10-CM | POA: Diagnosis not present

## 2018-02-23 DIAGNOSIS — M9902 Segmental and somatic dysfunction of thoracic region: Secondary | ICD-10-CM | POA: Diagnosis not present

## 2018-02-23 DIAGNOSIS — M545 Low back pain: Secondary | ICD-10-CM | POA: Diagnosis not present

## 2018-02-24 ENCOUNTER — Encounter (HOSPITAL_COMMUNITY): Payer: Medicare Other

## 2018-02-28 NOTE — Progress Notes (Signed)
Pulmonary Individual Treatment Plan  Patient Details  Name: [name] [name] MRN: [id number] Date of Birth: 02-01-1941 Referring Provider:     PULMONARY REHAB OTHER RESPIRATORY from 10/01/2017 in [place]  Referring Provider  Dr. Annamaria Boots      Initial Encounter Date:    PULMONARY REHAB OTHER RESPIRATORY from 10/01/2017 in [place]  Date  10/01/17      Visit Diagnosis: Asthma in adult, severe persistent, uncomplicated  Patient's Home Medications on Admission:   Current Outpatient Medications:  .  acetaminophen (TYLENOL) 500 MG tablet, Take 500 mg by mouth daily as needed for headache., Disp: , Rfl:  .  antiseptic oral rinse (BIOTENE) LIQD, 15 mLs by Mouth Rinse route as needed for dry mouth., Disp: , Rfl:  .  atorvastatin (LIPITOR) 10 MG tablet, Take 10 mg by mouth every evening. , Disp: , Rfl:  .  baclofen (LIORESAL) 10 MG tablet, Take 10 mg by mouth as needed for muscle spasms., Disp: , Rfl:  .  benzonatate (TESSALON) 200 MG capsule, Take 1 capsule (200 mg total) by mouth 3 (three) times daily as needed for cough., Disp: 30 capsule, Rfl: 3 .  Biotin 5000 MCG CAPS, Take 5,000 mcg by mouth daily., Disp: , Rfl:  .  budesonide (PULMICORT) 0.5 MG/2ML nebulizer solution, Take 0.5 mg by nebulization 2 (two) times daily. , Disp: , Rfl:  .  cetirizine (ZYRTEC) 10 MG tablet, Take 10 mg by mouth daily as needed for allergies., Disp: , Rfl:  .  clindamycin (CLEOCIN) 300 MG capsule, 2 capsules as needed. Take prior to dental procedures, Disp: , Rfl: 0 .  cycloSPORINE (RESTASIS) 0.05 % ophthalmic emulsion, Place 1 drop 2 (two) times daily into both eyes. , Disp: , Rfl:  .  diltiazem (CARTIA XT) 120 MG 24 hr capsule, Take 120 mg by mouth at bedtime. , Disp: , Rfl:  .  dofetilide (TIKOSYN) 500 MCG capsule, Take 1 capsule (500 mcg total) by mouth 2 (two) times daily., Disp: 180 capsule, Rfl: 3 .  famotidine (PEPCID) 20 MG tablet, Take 1 tablet (20 mg  total) by mouth at bedtime., Disp: 30 tablet, Rfl: 2 .  fluticasone (FLONASE) 50 MCG/ACT nasal spray, Place 1 spray into both nostrils 2 (two) times daily., Disp: , Rfl:  .  HYDROcodone-homatropine (HYDROMET) 5-1.5 MG/5ML syrup, Take 5 mLs by mouth every 6 (six) hours as needed for cough., Disp: 120 mL, Rfl: 0 .  Hypromellose (ARTIFICIAL TEARS OP), Apply 1 drop to eye every 4 (four) hours as needed (dry eyes). , Disp: , Rfl:  .  ipratropium (ATROVENT) 0.06 % nasal spray, Place 2 sprays into both nostrils 4 (four) times daily as needed for rhinitis. , Disp: , Rfl:  .  ipratropium-albuterol (DUONEB) 0.5-2.5 (3) MG/3ML SOLN, Take 3 mLs by nebulization 2 (two) times daily. , Disp: , Rfl:  .  losartan (COZAAR) 100 MG tablet, Take 1 tablet (100 mg total) by mouth daily., Disp: 90 tablet, Rfl: 2 .  montelukast (SINGULAIR) 10 MG tablet, Take 10 mg by mouth at bedtime., Disp: , Rfl:  .  omeprazole (PRILOSEC) 20 MG capsule, Take 20 mg by mouth daily., Disp: , Rfl:  .  potassium chloride SA (K-DUR,KLOR-CON) 20 MEQ tablet, Take 1 tablet (20 mEq total) by mouth daily., Disp: 30 tablet, Rfl: 6 .  sodium chloride (OCEAN) 0.65 % SOLN nasal spray, Place 1 spray into both nostrils 2 (two) times daily. , Disp: , Rfl:  .  triamcinolone ointment (KENALOG) 0.1 %, Apply 1 application topically daily as needed for rash., Disp: , Rfl: 2 .  umeclidinium-vilanterol (ANORO ELLIPTA) 62.5-25 MCG/INH AEPB, Inhale 1 puff into the lungs daily., Disp: 3 each, Rfl: 3 .  WARFARIN SODIUM PO, Take by mouth as directed., Disp: , Rfl:  .  Wheat Dextrin (BENEFIBER ON THE GO) POWD, Take 1 Dose by mouth daily as needed (constipation). , Disp: , Rfl:   Current Facility-Administered Medications:  Marland Kitchen  Mepolizumab SOLR 100 mg, 100 mg, Subcutaneous, Q28 days, Young, Clinton D, MD, 100 mg at 02/16/18 1202  Past Medical History: Past Medical History:  Diagnosis Date  . Allergic rhinitis   . Anxiety   . Asthma   . Atrial flutter ([place])   .  COPD (chronic obstructive pulmonary disease) ([place])   . Depression   . Essential hypertension   . Hyperlipidemia   . Lumbar disc disease   . Obesity   . OSA (obstructive sleep apnea)    CPAP  . Paroxysmal atrial fibrillation ([facility])    Element of tachycardia bradycardia syndrome  . Respiratory failure (H[facility])     Tobacco Use: Social History   Tobacco Use  Smoking Status Never Smoker  Smokeless Tobacco Never Used    Labs: Recent Review Flowsheet Data    There is no flowsheet data to display.      Capillary Blood Glucose: No results found for: GLUCAP   Pulmonary Assessment Scores: Pulmonary Assessment Scores    Row Name 10/01/17 1124         ADL UCSD   ADL Phase  Entry     SOB Score total  46     Rest  0     Walk  9     Stairs  4     Bath  2     Dress  2     Shop  2       CAT Score   CAT Score  19       mMRC Score   mMRC Score  3        Pulmonary Function Assessment: Pulmonary Function Assessment - 10/01/17 1123      Pulmonary Function Tests   FVC%  79 %    FEV1%  78 %    FEV1/FVC Ratio  99    RV%  92 %    DLCO%  81 %      Initial Spirometry Results   FVC%  79 %    FEV1%  78 %    FEV1/FVC Ratio  99      Post Bronchodilator Spirometry Results   FVC%  77 %    FEV1%  83 %    FEV1/FVC Ratio  108      Breath   Bilateral Breath Sounds  Clear    Shortness of Breath  Yes       Exercise Target Goals: Exercise Program Goal: Individual exercise prescription set using results from initial 6 min walk test and THRR while considering  patient's activity barriers and safety.   Exercise Prescription Goal: Initial exercise prescription builds to 30-45 minutes a day of aerobic activity, 2-3 days per week.  Home exercise guidelines will be given to patient during program as part of exercise prescription that the participant will acknowledge.  Activity Barriers & Risk Stratification: Activity Barriers & Cardiac Risk Stratification - 10/01/17 1007       Activity Barriers & Cardiac Risk Stratification   Activity Barriers  Back Problems;Deconditioning;Shortness  of Breath;Left Hip Replacement;Right Hip Replacement;Other (comment)    Comments  Patient great toe and 2nd toes are crossed. has been told by her doctor not to do activities that make her go up on her toes.     Cardiac Risk Stratification  Moderate       6 Minute Walk: 6 Minute Walk    Row Name 10/01/17 1006         6 Minute Walk   Phase  Initial     Distance  900 feet     Distance % Change  0 %     Distance Feet Change  0 ft     Walk Time  6 minutes     # of Rest Breaks  0     MPH  1.7     METS  2.3     RPE  13     Perceived Dyspnea   14     VO2 Peak  6.24     Symptoms  Yes (comment)     Comments  Patient had to push a wheelchair half way through walk test due to extreme shortness of breath     Resting HR  62 bpm     Resting BP  124/52     Resting Oxygen Saturation   97 %     Exercise Oxygen Saturation  during 6 min walk  98 %     Max Ex. HR  97 bpm     Max Ex. BP  200/62     2 Minute Post BP  154/60        Oxygen Initial Assessment: Oxygen Initial Assessment - 10/01/17 1127      Home Oxygen   Home Oxygen Device  None    Sleep Oxygen Prescription  None    Home Exercise Oxygen Prescription  None    Home at Rest Exercise Oxygen Prescription  None    Compliance with Home Oxygen Use  --   N/A     Initial 6 min Walk   Oxygen Used  None      Program Oxygen Prescription   Program Oxygen Prescription  None       Oxygen Re-Evaluation: Oxygen Re-Evaluation    Row Name 11/04/17 0742 11/24/17 1504 12/16/17 0740 01/06/18 0926 02/03/18 0803     Program Oxygen Prescription   Program Oxygen Prescription  None  None  None  None  None     Home Oxygen   Home Oxygen Device  None  None  None  None  None   Sleep Oxygen Prescription  None  None  None  None  None   Home Exercise Oxygen Prescription  -  None  None  None  None   Home at Rest Exercise Oxygen  Prescription  None  None  None  None  None   Compliance with Home Oxygen Use  Yes  Yes  Yes  Yes  Yes     Goals/Expected Outcomes   Short Term Goals  To learn and understand importance of maintaining oxygen saturations>88%;To learn and understand importance of monitoring SPO2 with pulse oximeter and demonstrate accurate use of the pulse oximeter.;To learn and demonstrate proper pursed lip breathing techniques or other breathing techniques.  To learn and understand importance of maintaining oxygen saturations>88%;To learn and understand importance of monitoring SPO2 with pulse oximeter and demonstrate accurate use of the pulse oximeter.;To learn and demonstrate proper pursed lip breathing techniques or other breathing techniques.  To learn and  understand importance of maintaining oxygen saturations>88%;To learn and understand importance of monitoring SPO2 with pulse oximeter and demonstrate accurate use of the pulse oximeter.;To learn and demonstrate proper pursed lip breathing techniques or other breathing techniques.  To learn and understand importance of maintaining oxygen saturations>88%;To learn and understand importance of monitoring SPO2 with pulse oximeter and demonstrate accurate use of the pulse oximeter.;To learn and demonstrate proper pursed lip breathing techniques or other breathing techniques.  To learn and understand importance of maintaining oxygen saturations>88%;To learn and understand importance of monitoring SPO2 with pulse oximeter and demonstrate accurate use of the pulse oximeter.;To learn and demonstrate proper pursed lip breathing techniques or other breathing techniques.   Long  Term Goals  Maintenance of O2 saturations>88%;Verbalizes importance of monitoring SPO2 with pulse oximeter and return demonstration;Exhibits proper breathing techniques, such as pursed lip breathing or other method taught during program session  Maintenance of O2 saturations>88%;Verbalizes importance of  monitoring SPO2 with pulse oximeter and return demonstration;Exhibits proper breathing techniques, such as pursed lip breathing or other method taught during program session  Maintenance of O2 saturations>88%;Verbalizes importance of monitoring SPO2 with pulse oximeter and return demonstration;Exhibits proper breathing techniques, such as pursed lip breathing or other method taught during program session  Maintenance of O2 saturations>88%;Verbalizes importance of monitoring SPO2 with pulse oximeter and return demonstration;Exhibits proper breathing techniques, such as pursed lip breathing or other method taught during program session  Maintenance of O2 saturations>88%;Verbalizes importance of monitoring SPO2 with pulse oximeter and return demonstration;Exhibits proper breathing techniques, such as pursed lip breathing or other method taught during program session   Comments  Patient verbalizes understanding of maintaining O2 saturation >88% and is able to use pulse oximeter with return demonstration. She exhibits proper pursed lip breathing techniques during exercise.   Patient verbalizes understanding of maintaining O2 saturation >88% and is able to use pulse oximeter with return demonstration. She exhibits proper pursed lip breathing techniques during exercise.   Patient verbalizes understanding of maintaining O2 saturation >88% and is able to use pulse oximeter with return demonstration. She exhibits proper pursed lip breathing techniques during exercise.   Patient verbalizes understanding of maintaining O2 saturation >88% and is able to use pulse oximeter with return demonstration. She exhibits proper pursed lip breathing techniques during exercise.   Patient verbalizes understanding of maintaining O2 saturation >88% and is able to use pulse oximeter with return demonstration. She exhibits proper pursed lip breathing techniques during exercise.    Goals/Expected Outcomes  Patient will continue to meet her  short and long term goals.   Patient will continue to meet her short and long term goals.   Patient will continue to meet her short and long term goals.   Patient will continue to meet her short and long term goals.   Patient will continue to meet her short and long term goals.    [place] Name 02/28/18 1408             Program Oxygen Prescription   Program Oxygen Prescription  None         Home Oxygen   Home Oxygen Device  None       Sleep Oxygen Prescription  None       Home Exercise Oxygen Prescription  None       Home at Rest Exercise Oxygen Prescription  None       Compliance with Home Oxygen Use  Yes         Goals/Expected Outcomes   Short Term  Goals  To learn and understand importance of maintaining oxygen saturations>88%;To learn and understand importance of monitoring SPO2 with pulse oximeter and demonstrate accurate use of the pulse oximeter.;To learn and demonstrate proper pursed lip breathing techniques or other breathing techniques.       Long  Term Goals  Maintenance of O2 saturations>88%;Verbalizes importance of monitoring SPO2 with pulse oximeter and return demonstration;Exhibits proper breathing techniques, such as pursed lip breathing or other method taught during program session       Comments  Patient verbalizes understanding of maintaining O2 saturation >88% and is able to use pulse oximeter with return demonstration. She exhibits proper pursed lip breathing techniques during exercise.        Goals/Expected Outcomes  Patient will continue to meet her short and long term goals.           Oxygen Discharge (Final Oxygen Re-Evaluation): Oxygen Re-Evaluation - 02/28/18 1408      Program Oxygen Prescription   Program Oxygen Prescription  None      Home Oxygen   Home Oxygen Device  None    Sleep Oxygen Prescription  None    Home Exercise Oxygen Prescription  None    Home at Rest Exercise Oxygen Prescription  None    Compliance with Home Oxygen Use  Yes       Goals/Expected Outcomes   Short Term Goals  To learn and understand importance of maintaining oxygen saturations>88%;To learn and understand importance of monitoring SPO2 with pulse oximeter and demonstrate accurate use of the pulse oximeter.;To learn and demonstrate proper pursed lip breathing techniques or other breathing techniques.    Long  Term Goals  Maintenance of O2 saturations>88%;Verbalizes importance of monitoring SPO2 with pulse oximeter and return demonstration;Exhibits proper breathing techniques, such as pursed lip breathing or other method taught during program session    Comments  Patient verbalizes understanding of maintaining O2 saturation >88% and is able to use pulse oximeter with return demonstration. She exhibits proper pursed lip breathing techniques during exercise.     Goals/Expected Outcomes  Patient will continue to meet her short and long term goals.        Initial Exercise Prescription: Initial Exercise Prescription - 10/01/17 1000      Date of Initial Exercise RX and Referring Provider   Date  10/01/17    Referring Provider  Dr. Annamaria Boots      NuStep   Level  1    SPM  77    Minutes  15    METs  1.8      Arm Ergometer   Level  1.2    Watts  16    RPM  22    Minutes  20    METs  2.2      Prescription Details   Frequency (times per week)  2    Duration  Progress to 30 minutes of continuous aerobic without signs/symptoms of physical distress      Intensity   THRR 40-80% of Max Heartrate  [phone]    Ratings of Perceived Exertion  11-13    Perceived Dyspnea  0-4      Progression   Progression  Continue progressive overload as per policy without signs/symptoms or physical distress.      Resistance Training   Training Prescription  Yes    Weight  1    Reps  10-15       Perform Capillary Blood Glucose checks as needed.  Exercise Prescription Changes: Exercise Prescription Changes  Row Name 10/17/17 1200 11/02/17 0800 11/22/17 0700 12/03/17  1400 12/29/17 1400     Response to Exercise   Blood Pressure (Admit)  144/60  114/60  122/62  -  122/60   Blood Pressure (Exercise)  158/60  148/54  152/70  142/60  144/70   Blood Pressure (Exit)  146/60  118/60  132/62  120/68  115/60   Heart Rate (Admit)  64 bpm  61 bpm  58 bpm  63 bpm  61 bpm   Heart Rate (Exercise)  83 bpm  80 bpm  83 bpm  77 bpm  88 bpm   Heart Rate (Exit)  68 bpm  67 bpm  69 bpm  64 bpm  64 bpm   Oxygen Saturation (Admit)  97 %  98 %  96 %  96 %  96 %   Oxygen Saturation (Exercise)  97 %  96 %  90 %  96 %  97 %   Oxygen Saturation (Exit)  95 %  96 %  97 %  98 %  97 %   Rating of Perceived Exertion (Exercise)  _0 Perceived Dyspnea (Exercise)  _1 Duration  Progress to 30 minutes of  aerobic without signs/symptoms of physical distress  Progress to 30 minutes of  aerobic without signs/symptoms of physical distress  Progress to 30 minutes of  aerobic without signs/symptoms of physical distress  Progress to 30 minutes of  aerobic without signs/symptoms of physical distress  Progress to 30 minutes of  aerobic without signs/symptoms of physical distress   Intensity  THRR New 96-111-127  THRR New 93-110-126  THRR New 92-108-125  THRR unchanged  THRR unchanged     Progression   Progression  Continue to progress workloads to maintain intensity without signs/symptoms of physical distress.  Continue to progress workloads to maintain intensity without signs/symptoms of physical distress.  Continue to progress workloads to maintain intensity without signs/symptoms of physical distress.  Continue to progress workloads to maintain intensity without signs/symptoms of physical distress.  Continue to progress workloads to maintain intensity without signs/symptoms of physical distress.   Average METs  2.45  2.55  2.8  2.1  2.65     Resistance Training   Training Prescription  Yes  Yes  Yes  Yes  Yes   Weight  _2 Reps  10-15  10-15  10-15   10-15  10-15   Time  5 Minutes  5 Minutes  5 Minutes  5 Minutes  5 Minutes     NuStep   Level  _3 SPM  99  100  110  108  101   Minutes  _4 METs  2.6  2.7  2.9  2.9  2.7     Arm Ergometer   Level  1.2  1.3  1.3  1.3  1.3   Watts  18  19  72  72  22   RPM  23  67  70  70  74   Minutes  _5 METs  2.3  2.4  2.7  2.7  2.6     Home Exercise Plan   Plans to continue exercise at  Home (comment)  Home (comment)  Home (comment)  Home (comment)  Home (comment)   Frequency  Add 3 additional days to program exercise sessions.  Add 3 additional days to program exercise sessions.  Add 3 additional days to program exercise sessions.  Add 3 additional days to program exercise sessions.  Add 3 additional days to program exercise sessions.   Initial Home Exercises Provided  10/18/17  10/18/17  10/18/17  10/18/17  10/18/17   Row Name 01/18/18 1300 02/03/18 1500 02/18/18 1400         Response to Exercise   Blood Pressure (Admit)  140/58  144/50  122/50     Blood Pressure (Exercise)  162/64  190/60  140/50     Blood Pressure (Exit)  130/60  130/60  126/60     Heart Rate (Admit)  57 bpm  62 bpm  75 bpm     Heart Rate (Exercise)  88 bpm  88 bpm  91 bpm     Heart Rate (Exit)  68 bpm  65 bpm  90 bpm     Oxygen Saturation (Admit)  98 %  96 %  94 %     Oxygen Saturation (Exercise)  97 %  97 %  97 %     Oxygen Saturation (Exit)  97 %  96 %  97 %     Rating of Perceived Exertion (Exercise)  _0 Perceived Dyspnea (Exercise)  _1 Symptoms  -  -  paitent has been experiencing hip pain 7/10      Comments  increase in overall MET level  -  -     Duration  Progress to 30 minutes of  aerobic without signs/symptoms of physical distress  Progress to 30 minutes of  aerobic without signs/symptoms of physical distress  Progress to 30 minutes of  aerobic without signs/symptoms of physical distress     Intensity  THRR unchanged  THRR unchanged  THRR  unchanged       Progression   Progression  Continue to progress workloads to maintain intensity without signs/symptoms of physical distress.  Continue to progress workloads to maintain intensity without signs/symptoms of physical distress.  Continue to progress workloads to maintain intensity without signs/symptoms of physical distress.     Average METs  2.85  2.9  2.3       Resistance Training   Training Prescription  Yes  Yes  Yes     Weight  _2 Reps  10-15  10-15  10-15     Time  5 Minutes  5 Minutes  5 Minutes       NuStep   Level  _3 decreased due to hip pain      SPM  103  101  81     Minutes  _4 METs  2.7  2.8  1.8       Arm Ergometer   Level  _5 Watts  _6 RPM  64  54  53     Minutes  _7 METs  3  3  2.8       Home Exercise Plan   Plans to continue exercise at  Home (comment)  Home (comment)  Home (comment)     Frequency  Add 3 additional days to program exercise sessions.  Add 3 additional days to program exercise sessions.  Add 3 additional days to program exercise sessions.     Initial Home Exercises Provided  10/18/17  10/18/17  10/18/17        Exercise Comments: Exercise Comments    Row Name 11/02/17 0844 11/22/17 0738 12/03/17 1426 12/14/17 0808 01/04/18 1008   Exercise Comments  Patient is breathing better. Able to do more and does feel like the program is helping her achieve her goals.   patient feels like the program is helping her. She has been going to strength training classes at the [facility].   patient feels like the program is helping her. She has been going to strength training classes at the [facility].   Patient feels like the program is helping her. Aside from the recent set back with her neck, she feels she is gaining strength.   Patient is pleased with how the program is helping her and getting her back to being able to be more active in the church.    [place] Name 02/02/18 1447 02/22/18 0828          Exercise Comments  Patient has continued to increase her activity levels outside of rehab. She is busy helping in her church and working out in her yard.   Patient works hard in rehab but has been experiencing a set back with hip pain. She continues to keep busy outside of rehab with her church and things around the house.          Exercise Goals and Review: Exercise Goals    Row Name 10/01/17 1009             Exercise Goals   Increase Physical Activity  Yes       Intervention  Develop an individualized exercise prescription for aerobic and resistive training based on initial evaluation findings, risk stratification, comorbidities and participant's personal goals.;Provide advice, education, support and counseling about physical activity/exercise needs.       Expected Outcomes  Short Term: Attend rehab on a regular basis to increase amount of physical activity.       Increase Strength and Stamina  Yes       Intervention  Provide advice, education, support and counseling about physical activity/exercise needs.;Develop an individualized exercise prescription for aerobic and resistive training based on initial evaluation findings, risk stratification, comorbidities and participant's personal goals.       Expected Outcomes  Short Term: Increase workloads from initial exercise prescription for resistance, speed, and METs.       Able to understand and use rate of perceived exertion (RPE) scale  Yes       Intervention  Provide education and explanation on how to use RPE scale       Expected Outcomes  Short Term: Able to use RPE daily in rehab to express subjective intensity level;Long Term:  Able to use RPE to guide intensity level when exercising independently       Able to understand and use Dyspnea scale  Yes       Intervention  Provide education and explanation on how to use Dyspnea scale       Expected Outcomes  Short Term: Able to use Dyspnea scale daily in rehab to express subjective sense of  shortness of breath during exertion;Long Term: Able to use Dyspnea scale to guide intensity level  when exercising independently       Knowledge and understanding of Target Heart Rate Range (THRR)  Yes       Intervention  Provide education and explanation of THRR including how the numbers were predicted and where they are located for reference       Expected Outcomes  Short Term: Able to state/look up THRR;Short Term: Able to use daily as guideline for intensity in rehab;Long Term: Able to use THRR to govern intensity when exercising independently       Understanding of Exercise Prescription  Yes       Intervention  Provide education, explanation, and written materials on patient's individual exercise prescription       Expected Outcomes  Short Term: Able to explain program exercise prescription;Long Term: Able to explain home exercise prescription to exercise independently          Exercise Goals Re-Evaluation : Exercise Goals Re-Evaluation    Row Name 10/04/17 1440 11/02/17 0840 11/22/17 0735 12/03/17 1425 12/14/17 0805     Exercise Goal Re-Evaluation   Exercise Goals Review  Increase Physical Activity;Increase Strength and Stamina;Able to understand and use rate of perceived exertion (RPE) scale;Able to understand and use Dyspnea scale;Knowledge and understanding of Target Heart Rate Range (THRR);Understanding of Exercise Prescription  Increase Strength and Stamina;Increase Physical Activity;Able to understand and use rate of perceived exertion (RPE) scale;Able to understand and use Dyspnea scale;Understanding of Exercise Prescription;Knowledge and understanding of Target Heart Rate Range (THRR)  Increase Strength and Stamina;Able to understand and use Dyspnea scale;Understanding of Exercise Prescription  Increase Strength and Stamina;Able to understand and use Dyspnea scale;Understanding of Exercise Prescription  Increase Strength and Stamina;Able to understand and use Dyspnea scale;Understanding  of Exercise Prescription   Comments  Patient has not yet started PR. Patient will be progressed in time in accordance with her goals.   Patient is progessing at a steady rate. Patient is able to handle increases in weights and equipment workloads.   Patient has said that she has been able to return to the [facility] to attend several fitness classes. she still wants to be able to return to the pool to do water aerobics.   Patient has said that she has been able to return to the [facility] to attend several fitness classes. She still wants to be able to return to the pool to do water aerobics.   Patient has been out due to having extreme soreness in neck. Has had to see a chriopractor and seek the assistance of massage therapyin an effort to ease the pain and discomfort. She called and said her doctors says she can return with doing everything in a light manner.    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   Get back to swimming and be able to sing on the choir again.   Get back to swimming and be able to sing on the choir again.    [place] Name 01/04/18 1005 02/02/18 1446 02/22/18 0825         Exercise Goal Re-Evaluation   Exercise Goals Review  Increase Strength and Stamina;Able to understand and use Dyspnea scale;Understanding of Exercise Prescription  Increase Strength and Stamina;Able  to understand and use Dyspnea scale;Understanding of Exercise Prescription  Increase Strength and Stamina;Able to understand and use Dyspnea scale;Understanding of Exercise Prescription;Increase Physical Activity;Able to understand and use rate of perceived exertion (RPE) scale;Knowledge and understanding of Target Heart Rate Range (THRR);Able to check pulse  independently     Comments  Patient has returned and is tolerating execise well. She works hard everyday to increase her distance on each machine and is impressed with her progress thus far.   Patient continues to do well in rehab since returning routinely. She has been experiencing some hip pain, but is still able to work on her machines. We will continue to progress as tolerated.   Patient has continued to work hard in rehab. She has been experiencing chronic hip pain so has bumped back some of her workloads and is tolerating it well.      Expected Outcomes  Increase energy levels and improve shortness of breath.   Improve SOB with activity.   Improve SOB with singing in the choir and doing yard work.         Discharge Exercise Prescription (Final Exercise Prescription Changes): Exercise Prescription Changes - 02/18/18 1400      Response to Exercise   Blood Pressure (Admit)  122/50    Blood Pressure (Exercise)  140/50    Blood Pressure (Exit)  126/60    Heart Rate (Admit)  75 bpm    Heart Rate (Exercise)  91 bpm    Heart Rate (Exit)  90 bpm    Oxygen Saturation (Admit)  94 %    Oxygen Saturation (Exercise)  97 %    Oxygen Saturation (Exit)  97 %    Rating of Perceived Exertion (Exercise)  11    Perceived Dyspnea (Exercise)  11    Symptoms  paitent has been experiencing hip pain 7/10     Duration  Progress to 30 minutes of  aerobic without signs/symptoms of physical distress    Intensity  THRR unchanged      Progression   Progression  Continue to progress workloads to maintain intensity without signs/symptoms of physical distress.    Average METs  2.3      Resistance Training   Training Prescription  Yes    Weight  4    Reps  10-15    Time  5 Minutes      NuStep   Level  2   decreased due to hip pain    SPM  81    Minutes  17    METs  1.8      Arm Ergometer   Level  3    Watts  24    RPM  53    Minutes  22    METs  2.8      Home Exercise Plan   Plans to continue  exercise at  Home (comment)    Frequency  Add 3 additional days to program exercise sessions.    Initial Home Exercises Provided  10/18/17       Nutrition:  Target Goals: Understanding of nutrition guidelines, daily intake of sodium <1537m, cholesterol <2088m calories 30% from fat and 7% or less from saturated fats, daily to have 5 or more servings of fruits and vegetables.  Biometrics: Pre Biometrics - 10/01/17 1010      Pre Biometrics   Height  5' 3" (1.6 m)    Weight  92 kg    Waist Circumference  48 inches    Hip  Circumference  39.5 inches    Waist to Hip Ratio  1.22 %    BMI (Calculated)  35.93    Triceps Skinfold  5 mm    % Body Fat  40.2 %    Grip Strength  51.06 kg    Flexibility  0 in    Single Leg Stand  7 seconds        Nutrition Therapy Plan and Nutrition Goals: Nutrition Therapy & Goals - 10/07/17 1503      Personal Nutrition Goals   Nutrition Goal  For heart healthy choices add >50% of whole grains, make half their plate fruits and vegetables. Discuss the difference between starchy vegetables and leafy greens, and how leafy vegetables provide fiber, helps maintain healthy weight, helps control blood glucose, and lowers cholesterol.  Discuss purchasing fresh or frozen vegetable to reduce sodium and not to add grease, fat or sugar. Consume <18oz of red meat per week. Consume lean cuts of meats and very little of meats high in sodium and nitrates such as pork and lunch meats. Discussed portion control for all food groups.        Intervention Plan   Intervention  Nutrition handout(s) given to patient.    Expected Outcomes  Short Term Goal: Understand basic principles of dietary content, such as calories, fat, sodium, cholesterol and nutrients.       Nutrition Assessments: Nutrition Assessments - 10/01/17 1132      MEDFICTS Scores   Pre Score  54       Nutrition Goals Re-Evaluation: Nutrition Goals Re-Evaluation    Row Name 11/04/17 0752 11/24/17 1505  12/16/17 0740 01/06/18 0926 02/03/18 0803     Goals   Current Weight  202 lb (91.6 kg)  200 lb (90.7 kg)  200 lb (90.7 kg)  202 lb (91.6 kg)  206 lb (93.4 kg)   Nutrition Goal  For heart healthy choices add >50% of whole grains, make half their plate fruits and vegetables. Discuss the difference between starchy vegetables and leafy greens, and how leafy vegetables provide fiber, helps maintain healthy weight, helps control blood glucose, and lowers cholesterol.  Discuss purchasing fresh or frozen vegetable to reduce sodium and not to add grease, fat or sugar. Consume <18oz of red meat per week. Consume lean cuts of meats and very little of meats high in sodium and nitrates such as pork and lunch meats. Discussed portion control for all food groups.    For heart healthy choices add >50% of whole grains, make half their plate fruits and vegetables. Discuss the difference between starchy vegetables and leafy greens, and how leafy vegetables provide fiber, helps maintain healthy weight, helps control blood glucose, and lowers cholesterol.  Discuss purchasing fresh or frozen vegetable to reduce sodium and not to add grease, fat or sugar. Consume <18oz of red meat per week. Consume lean cuts of meats and very little of meats high in sodium and nitrates such as pork and lunch meats. Discussed portion control for all food groups.    For heart healthy choices add >50% of whole grains, make half their plate fruits and vegetables. Discuss the difference between starchy vegetables and leafy greens, and how leafy vegetables provide fiber, helps maintain healthy weight, helps control blood glucose, and lowers cholesterol.  Discuss purchasing fresh or frozen vegetable to reduce sodium and not to add grease, fat or sugar. Consume <18oz of red meat per week. Consume lean cuts of meats and very little of meats high in sodium  and nitrates such as pork and lunch meats. Discussed portion control for all food groups.    For heart  healthy choices add >50% of whole grains, make half their plate fruits and vegetables. Discuss the difference between starchy vegetables and leafy greens, and how leafy vegetables provide fiber, helps maintain healthy weight, helps control blood glucose, and lowers cholesterol.  Discuss purchasing fresh or frozen vegetable to reduce sodium and not to add grease, fat or sugar. Consume <18oz of red meat per week. Consume lean cuts of meats and very little of meats high in sodium and nitrates such as pork and lunch meats. Discussed portion control for all food groups.    For heart healthy choices add >50% of whole grains, make half their plate fruits and vegetables. Discuss the difference between starchy vegetables and leafy greens, and how leafy vegetables provide fiber, helps maintain healthy weight, helps control blood glucose, and lowers cholesterol.  Discuss purchasing fresh or frozen vegetable to reduce sodium and not to add grease, fat or sugar. Consume <18oz of red meat per week. Consume lean cuts of meats and very little of meats high in sodium and nitrates such as pork and lunch meats. Discussed portion control for all food groups.     Comment  Patient says she is eating heart healthy and is working toward meeting her nutritional goals.   Patient has lost 2 lbs since last 30 day review. She says she is continuing to eat healthy. Will continue to monitor.   Patient has maintained her weight since last 30 day review. She says she is continuing to eat healthy. Will continue to monitor.   Patient has gained 2 lbs since last 30 day review. She continues to say she is trying to eat healthy. Will continue to monitor.   Patient has gained 4 lbs since last 30 day review. She says she is trying to eat healthy but it has been hard recently with all the fall festivities she has been attending.    Expected Outcome  Patient will continue to work toward meeting her nutritional needes. Will continue to monitor for  progress.   Patient will continue to work toward meeting her nutritional needes. Will continue to monitor for progress.   Patient will continue to work toward meeting her nutritional needes. Will continue to monitor for progress.   Patient will continue to work toward meeting her nutritional needes. Will continue to monitor for progress.   Patient will continue to work toward meeting her nutritional needes. Will continue to monitor for progress.      Personal Goal #2 Re-Evaluation   Personal Goal #2  Patient is working on eating heart healthy. Handout given for her to improve on her food choices.   Patient is working on eating heart healthy. Handout given for her to improve on her food choices.   Patient is working on eating heart healthy. Handout given for her to improve on her food choices.   -  -   Row Name 02/28/18 1409             Goals   Current Weight  207 lb (93.9 kg)       Nutrition Goal  For heart healthy choices add >50% of whole grains, make half their plate fruits and vegetables. Discuss the difference between starchy vegetables and leafy greens, and how leafy vegetables provide fiber, helps maintain healthy weight, helps control blood glucose, and lowers cholesterol.  Discuss purchasing fresh or frozen vegetable to reduce  sodium and not to add grease, fat or sugar. Consume <18oz of red meat per week. Consume lean cuts of meats and very little of meats high in sodium and nitrates such as pork and lunch meats. Discussed portion control for all food groups.         Comment  Patient has lost 1 lb since last 30 day review. She continues to say she is trying to eat healthy. Will continue to monitor.        Expected Outcome  Patient will continue to work toward meeting her nutritional needes. Will continue to monitor for progress.          Personal Goal #2 Re-Evaluation   Personal Goal #2  Patient is working on eating heart healthy. Handout given for her to improve on her food choices.            Nutrition Goals Discharge (Final Nutrition Goals Re-Evaluation): Nutrition Goals Re-Evaluation - 02/28/18 1409      Goals   Current Weight  207 lb (93.9 kg)    Nutrition Goal  For heart healthy choices add >50% of whole grains, make half their plate fruits and vegetables. Discuss the difference between starchy vegetables and leafy greens, and how leafy vegetables provide fiber, helps maintain healthy weight, helps control blood glucose, and lowers cholesterol.  Discuss purchasing fresh or frozen vegetable to reduce sodium and not to add grease, fat or sugar. Consume <18oz of red meat per week. Consume lean cuts of meats and very little of meats high in sodium and nitrates such as pork and lunch meats. Discussed portion control for all food groups.      Comment  Patient has lost 1 lb since last 30 day review. She continues to say she is trying to eat healthy. Will continue to monitor.     Expected Outcome  Patient will continue to work toward meeting her nutritional needes. Will continue to monitor for progress.       Personal Goal #2 Re-Evaluation   Personal Goal #2  Patient is working on eating heart healthy. Handout given for her to improve on her food choices.        Psychosocial: Target Goals: Acknowledge presence or absence of significant depression and/or stress, maximize coping skills, provide positive support system. Participant is able to verbalize types and ability to use techniques and skills needed for reducing stress and depression.  Initial Review & Psychosocial Screening: Initial Psych Review & Screening - 10/01/17 1134      Initial Review   Current issues with  None Identified      Family Dynamics   Good Support System?  Yes      Barriers   Psychosocial barriers to participate in program  There are no identifiable barriers or psychosocial needs.      Screening Interventions   Interventions  Encouraged to exercise    Expected Outcomes  Short Term goal:  Identification and review with participant of any Quality of Life or Depression concerns found by scoring the questionnaire.;Long Term goal: The participant improves quality of Life and PHQ9 Scores as seen by post scores and/or verbalization of changes       Quality of Life Scores: Quality of Life - 10/01/17 1010      Quality of Life   Select  Quality of Life      Quality of Life Scores   Health/Function Pre  24.4 %    Socioeconomic Pre  26.75 %    Psych/Spiritual Pre  24.86 %    Family Pre  18 %    GLOBAL Pre  24.55 %      Scores of 19 and below usually indicate a poorer quality of life in these areas.  A difference of  2-3 points is a clinically meaningful difference.  A difference of 2-3 points in the total score of the Quality of Life Index has been associated with significant improvement in overall quality of life, self-image, physical symptoms, and general health in studies assessing change in quality of life.   PHQ-9: Recent Review Flowsheet Data    Depression screen [facility] 2/9 10/01/2017   Decreased Interest 0   Down, Depressed, Hopeless 0   PHQ - 2 Score 0   Altered sleeping 2   Tired, decreased energy 2   Change in appetite 2   Feeling bad or failure about yourself  0   Trouble concentrating 0   Moving slowly or fidgety/restless 0   Suicidal thoughts 0   PHQ-9 Score 6   Difficult doing work/chores Not difficult at all     Interpretation of Total Score  Total Score Depression Severity:  1-4 = Minimal depression, 5-9 = Mild depression, 10-14 = Moderate depression, 15-19 = Moderately severe depression, 20-27 = Severe depression   Psychosocial Evaluation and Intervention: Psychosocial Evaluation - 10/01/17 1135      Psychosocial Evaluation & Interventions   Interventions  Encouraged to exercise with the program and follow exercise prescription    Continue Psychosocial Services   No Follow up required       Psychosocial Re-Evaluation: Psychosocial Re-Evaluation     Row Name 11/04/17 0749 11/24/17 1508 12/16/17 0744 01/06/18 0929 02/03/18 0807     Psychosocial Re-Evaluation   Current issues with  None Identified  None Identified  None Identified  None Identified  None Identified   Comments  Patient initial QOL score was 18 and her PHQ-9 score was 6 with no psychosocial issues identified at orientation. Will continue to monitor.  Patient initial QOL score was 18 and her PHQ-9 score was 6 with no psychosocial issues identified at orientation. Will continue to monitor.  Patient initial QOL score was 18 and her PHQ-9 score was 6 with no psychosocial issues identified. Will continue to monitor.  Patient initial QOL score was 18 and her PHQ-9 score was 6 with no psychosocial issues identified. Will continue to monitor.  Patient initial QOL score was 18 and her PHQ-9 score was 6 with no psychosocial issues identified. Will continue to monitor.   Expected Outcomes  Patient will have no psychosocial issues identified at discharge.   Patient will have no psychosocial issues identified at discharge.   Patient will have no psychosocial issues identified at discharge.   Patient will have no psychosocial issues identified at discharge.   Patient will have no psychosocial issues identified at discharge.    Interventions  Relaxation education;Encouraged to attend Pulmonary Rehabilitation for the exercise;Stress management education  Relaxation education;Encouraged to attend Pulmonary Rehabilitation for the exercise;Stress management education  Relaxation education;Encouraged to attend Pulmonary Rehabilitation for the exercise;Stress management education  Relaxation education;Encouraged to attend Pulmonary Rehabilitation for the exercise;Stress management education  Relaxation education;Encouraged to attend Pulmonary Rehabilitation for the exercise;Stress management education   Continue Psychosocial Services   No Follow up required  No Follow up required  No Follow up required  No Follow  up required  No Follow up required   [place] Name 02/28/18 1412  Psychosocial Re-Evaluation   Current issues with  None Identified       Comments  Patient initial QOL score was 18 and her PHQ-9 score was 6 with no psychosocial issues identified. Will continue to monitor.       Expected Outcomes  Patient will have no psychosocial issues identified at discharge.        Interventions  Relaxation education;Encouraged to attend Pulmonary Rehabilitation for the exercise;Stress management education       Continue Psychosocial Services   No Follow up required          Psychosocial Discharge (Final Psychosocial Re-Evaluation): Psychosocial Re-Evaluation - 02/28/18 1412      Psychosocial Re-Evaluation   Current issues with  None Identified    Comments  Patient initial QOL score was 18 and her PHQ-9 score was 6 with no psychosocial issues identified. Will continue to monitor.    Expected Outcomes  Patient will have no psychosocial issues identified at discharge.     Interventions  Relaxation education;Encouraged to attend Pulmonary Rehabilitation for the exercise;Stress management education    Continue Psychosocial Services   No Follow up required        Education: Education Goals: Education classes will be provided on a weekly basis, covering required topics. Participant will state understanding/return demonstration of topics presented.  Learning Barriers/Preferences: Learning Barriers/Preferences - 10/01/17 1020      Learning Barriers/Preferences   Learning Barriers  None    Learning Preferences  Skilled Demonstration;Individual Instruction;Group Instruction       Education Topics: How Lungs Work and Diseases: - Discuss the anatomy of the lungs and diseases that can affect the lungs, such as COPD.   PULMONARY REHAB OTHER RESPIRATORY from 02/17/2018 in [place]  Date  11/04/17  Educator  DC  Instruction Review Code  2- Demonstrated Understanding       Exercise: -Discuss the importance of exercise, FITT principles of exercise, normal and abnormal responses to exercise, and how to exercise safely.   Environmental Irritants: -Discuss types of environmental irritants and how to limit exposure to environmental irritants.   PULMONARY REHAB OTHER RESPIRATORY from 02/17/2018 in [place]  Date  11/11/17  Educator  DWynetta Emery  Instruction Review Code  2- Demonstrated Understanding      Meds/Inhalers and oxygen: - Discuss respiratory medications, definition of an inhaler and oxygen, and the proper way to use an inhaler and oxygen.   PULMONARY REHAB OTHER RESPIRATORY from 02/17/2018 in [place]  Date  11/18/17  Educator  D. Wynetta Emery      Energy Saving Techniques: - Discuss methods to conserve energy and decrease shortness of breath when performing activities of daily living.    PULMONARY REHAB OTHER RESPIRATORY from 02/17/2018 in [place]  Date  11/25/17  Educator  Etheleen Mayhew  Instruction Review Code  2- Demonstrated Understanding      Bronchial Hygiene / Breathing Techniques: - Discuss breathing mechanics, pursed-lip breathing technique,  proper posture, effective ways to clear airways, and other functional breathing techniques   Cleaning Equipment: - Provides group verbal and written instruction about the health risks of elevated stress, cause of high stress, and healthy ways to reduce stress.   Nutrition I: Fats: - Discuss the types of cholesterol, what cholesterol does to the body, and how cholesterol levels can be controlled.   PULMONARY REHAB OTHER RESPIRATORY from 02/17/2018 in [place]  Date  12/16/17  Educator  D. Wynetta Emery  Instruction Review Code  2- Demonstrated Understanding      Nutrition II: Labels: -Discuss the different components of food labels and how to read food labels.   PULMONARY REHAB OTHER RESPIRATORY  from 02/17/2018 in [place]  Date  12/23/17  Educator  DWynetta Emery  Instruction Review Code  2- Demonstrated Understanding      Respiratory Infections: - Discuss the signs and symptoms of respiratory infections, ways to prevent respiratory infections, and the importance of seeking medical treatment when having a respiratory infection.   PULMONARY REHAB OTHER RESPIRATORY from 02/17/2018 in [place]  Date  01/06/18  Educator  DWynetta Emery  Instruction Review Code  2- Demonstrated Understanding      Stress I: Signs and Symptoms: - Discuss the causes of stress, how stress may lead to anxiety and depression, and ways to limit stress.   PULMONARY REHAB OTHER RESPIRATORY from 02/17/2018 in [place]  Date  10/07/17  Educator  DC  Instruction Review Code  2- Demonstrated Understanding      Stress II: Relaxation: -Discuss relaxation techniques to limit stress.   PULMONARY REHAB OTHER RESPIRATORY from 02/17/2018 in [place]  Date  10/14/17  Educator  DC  Instruction Review Code  2- Demonstrated Understanding      Oxygen for Home/Travel: - Discuss how to prepare for travel when on oxygen and proper ways to transport and store oxygen to ensure safety.   Knowledge Questionnaire Score: Knowledge Questionnaire Score - 10/01/17 1118      Knowledge Questionnaire Score   Pre Score  14/18       Core Components/Risk Factors/Patient Goals at Admission: Personal Goals and Risk Factors at Admission - 10/01/17 1132      Core Components/Risk Factors/Patient Goals on Admission    Weight Management  Obesity    Personal Goal Other  Yes    Personal Goal  Have more energy, breath better, get back to swimming, sing on the choir again.     Intervention  Attend CR 3 x week and supplement with at home exercise 2 x week.     Expected Outcomes  Achieve personal goals.        Core Components/Risk  Factors/Patient Goals Review:  Goals and Risk Factor Review    Row Name 11/04/17 0745 11/24/17 1506 12/16/17 0741 01/06/18 0927 02/03/18 0805     Core Components/Risk Factors/Patient Goals Review   Personal Goals Review  Weight Management/Obesity;Improve shortness of breath with ADL's;Develop more efficient breathing techniques such as purse lipped breathing and diaphragmatic breathing and practicing self-pacing with activity. More energy; breathe better; get back to swimming; sing again.  Weight Management/Obesity;Improve shortness of breath with ADL's;Develop more efficient breathing techniques such as purse lipped breathing and diaphragmatic breathing and practicing self-pacing with activity. More energy; breathe better; get back to swimming; sing again.   Weight Management/Obesity;Improve shortness of breath with ADL's;Develop more efficient breathing techniques such as purse lipped breathing and diaphragmatic breathing and practicing self-pacing with activity. More energy; breathe better; get back to swimming; sing again.   Weight Management/Obesity;Improve shortness of breath with ADL's;Develop more efficient breathing techniques such as purse lipped breathing and diaphragmatic breathing and practicing self-pacing with activity. More energy; breathe better; get back to swimming; sing again.   Weight Management/Obesity;Improve shortness of breath with ADL's;Develop more efficient breathing techniques such as purse lipped breathing and diaphragmatic breathing and practicing self-pacing with activity. More energy; breathe better; get back to swimming; sing  again.    Review  Patient has completed 8 sessions maintaining her weight. She is doing well in the program with some progression. She says she does feel her energy has increased. She is able to demonstrate proper pursed lip breathing techniques in the program during exercise. She does not see any improvement in her breathing yet but hopes to continue to  improve as she continues the program. Will continue to monitor for progress.   Patient has completed 13 sessions losing 2 lbs since last 30 day review. She continues to do well in the program with progressions. She continues to say she feels stronger and feels she is less SOB with activities. She is now able to participate in 2 classes at the [facility] and is planning to join a group of women that walk in her neighborhood. She is very pleased with her progress and hopes to see even more improvements as she continues. Will continue to monitor for progress.   Patient has completed 15 sessions maintaining her weight. She has only attended 2 sessions in this 30 day review due to pain in her back and shoulders. She was evaluated by her pcp who says she pulled a muscle. She continues to do well in the program and will hopefully be able to be more consistent in her attendance. She hopes to get back to her classes at the [facility]. Will continue to monitor for progress.   Patient has completed 20 sessions gaining 2 lbs since last 30 day review. She continues to do well in the program with progression. Her back and shoulders have healed. She says she feels stronger and has more energy. She is also breathing better and building upper body strength. She is going to classes at the [facility] but has not started swimming yet. Will continue to monitor for progress.   Patient has completed 27 sessions gaining 4 lbs since last 30 day review. She continues to do well in the program with progression. She says she feels so much stronger and has more energy. She is able to do things now without "giving out" or having to stop and rest. She is less SOB and she continues her classes at the [facility]. She has not started back swimming and says she may not return to swimming due to ear problems. She is very pleased with her progress. Will continue to monitor.    Expected Outcomes  Patient will continue to attend sessions and complete the program meeting her  personal goals.   Patient will continue to attend sessions and complete the program meeting her personal goals.   Patient will continue to attend sessions and complete the program meeting her personal goals.   Patient will continue to attend sessions and complete the program meeting her personal goals.   Patient will continue to attend sessions and complete the program meeting her personal goals.    [place] Name 02/28/18 1410             Core Components/Risk Factors/Patient Goals Review   Personal Goals Review  - More energy; breathe better; get back to swimming; sing again.       Review  Patient has completed 32 sessions losing 1 lb since last 30 day review. She continues to do well in the program with progression. She continues to say she feel stronger and better overall and has more energy. She is able do more activities with her church and friends and is able to continue classes at the [facility]. She is very  pleased with her progress in the program. Will continue to monitor.        Expected Outcomes  Patient will continue to attend sessions and complete the program meeting her personal goals.           Core Components/Risk Factors/Patient Goals at Discharge (Final Review):  Goals and Risk Factor Review - 02/28/18 1410      Core Components/Risk Factors/Patient Goals Review   Personal Goals Review  --   More energy; breathe better; get back to swimming; sing again.   Review  Patient has completed 32 sessions losing 1 lb since last 30 day review. She continues to do well in the program with progression. She continues to say she feel stronger and better overall and has more energy. She is able do more activities with her church and friends and is able to continue classes at the [facility]. She is very pleased with her progress in the program. Will continue to monitor.     Expected Outcomes  Patient will continue to attend sessions and complete the program meeting her personal goals.        ITP  Comments: ITP Comments    Row Name 10/06/17 1353 10/07/17 1504         ITP Comments  Patient new to program. She has completed 2 sessions. Will continue to monitor for progress.   Patient attended the Family Matters class with hospital chaplian to discuss how this event has impacted their life.          Comments: ITP REVIEW Pt is making expected progress toward pulmonary rehab goals after completing 32 sessions. Recommend continued exercise, life style modification, education, and utilization of breathing techniques to increase stamina and strength and decrease shortness of breath with exertion.

## 2018-03-01 ENCOUNTER — Encounter (HOSPITAL_COMMUNITY)
Admission: RE | Admit: 2018-03-01 | Discharge: 2018-03-01 | Disposition: A | Discharge status: Home / Self Care | Payer: Medicare Other | Source: Ambulatory Visit | Attending: Internal Medicine | Admitting: Internal Medicine

## 2018-03-01 DIAGNOSIS — E669 Obesity, unspecified: Secondary | ICD-10-CM | POA: Diagnosis not present

## 2018-03-01 DIAGNOSIS — F419 Anxiety disorder, unspecified: Secondary | ICD-10-CM | POA: Diagnosis not present

## 2018-03-01 DIAGNOSIS — G4733 Obstructive sleep apnea (adult) (pediatric): Secondary | ICD-10-CM | POA: Diagnosis not present

## 2018-03-01 DIAGNOSIS — J449 Chronic obstructive pulmonary disease, unspecified: Secondary | ICD-10-CM | POA: Diagnosis not present

## 2018-03-01 DIAGNOSIS — F329 Major depressive disorder, single episode, unspecified: Secondary | ICD-10-CM | POA: Insufficient documentation

## 2018-03-01 DIAGNOSIS — Z7901 Long term (current) use of anticoagulants: Secondary | ICD-10-CM | POA: Insufficient documentation

## 2018-03-01 DIAGNOSIS — E785 Hyperlipidemia, unspecified: Secondary | ICD-10-CM | POA: Diagnosis not present

## 2018-03-01 DIAGNOSIS — J4550 Severe persistent asthma, uncomplicated: Secondary | ICD-10-CM | POA: Insufficient documentation

## 2018-03-01 DIAGNOSIS — I480 Paroxysmal atrial fibrillation: Secondary | ICD-10-CM | POA: Insufficient documentation

## 2018-03-01 DIAGNOSIS — I10 Essential (primary) hypertension: Secondary | ICD-10-CM | POA: Diagnosis not present

## 2018-03-01 DIAGNOSIS — I4892 Unspecified atrial flutter: Secondary | ICD-10-CM | POA: Diagnosis not present

## 2018-03-01 DIAGNOSIS — Z79899 Other long term (current) drug therapy: Secondary | ICD-10-CM | POA: Insufficient documentation

## 2018-03-01 NOTE — Progress Notes (Signed)
Daily Session Note  Patient Details  Name: [name] [name] MRN: [id number] Date of Birth: 07/14/1940 Referring Provider:     PULMONARY REHAB OTHER RESPIRATORY from 10/01/2017 in [place]  Referring Provider  Dr. Annamaria Boots      Encounter Date: 03/01/2018  Check In: Session Check In - 03/01/18 1330      Check-In   Supervising physician immediately available to respond to emergencies  See telemetry face sheet for immediately available MD    Location  [facility]    Staff Present  Russella Dar, MS, EP, [facility], Exercise Physiologist;Nallely Yost Zachery Conch, Exercise Physiologist    Medication changes reported      No    Fall or balance concerns reported     No    Warm-up and Cool-down  Performed as group-led instruction    Resistance Training Performed  Yes    VAD Patient?  No    PAD/SET Patient?  No      Pain Assessment   Currently in Pain?  No/denies    Pain Score  0-No pain    Multiple Pain Sites  No       Capillary Blood Glucose: No results found for this or any previous visit (from the past 24 hour(s)).    Social History   Tobacco Use  Smoking Status Never Smoker  Smokeless Tobacco Never Used    Goals Met:  Proper associated with RPD/PD & O2 Sat Independence with exercise equipment Using PLB without cueing & demonstrates good technique Exercise tolerated well No report of cardiac concerns or symptoms Strength training completed today  Goals Unmet:  Not Applicable  Comments: Pt able to follow exercise prescription today without complaint.  Will continue to monitor for progression. Check out 2:30.   Dr. Sinda Du is Medical Director for [facility].

## 2018-03-02 ENCOUNTER — Ambulatory Visit: Payer: Medicare Other | Admitting: Internal Medicine

## 2018-03-03 ENCOUNTER — Encounter: Payer: Self-pay | Admitting: Internal Medicine

## 2018-03-03 ENCOUNTER — Encounter (HOSPITAL_COMMUNITY): Payer: Medicare Other

## 2018-03-03 ENCOUNTER — Ambulatory Visit (INDEPENDENT_AMBULATORY_CARE_PROVIDER_SITE_OTHER): Payer: Medicare Other | Admitting: Internal Medicine

## 2018-03-03 VITALS — BP 118/72 | HR 65 | Ht 63.0 in | Wt 204.6 lb

## 2018-03-03 DIAGNOSIS — G4733 Obstructive sleep apnea (adult) (pediatric): Secondary | ICD-10-CM

## 2018-03-03 DIAGNOSIS — J4520 Mild intermittent asthma, uncomplicated: Secondary | ICD-10-CM | POA: Diagnosis not present

## 2018-03-03 NOTE — Assessment & Plan Note (Signed)
Minimal airway obstruction on PFT in 2018.  She is near baseline. Absent acute infection, short-term changes in her breathing will most likely reflect CHF status and cardiac function. Plan-continue current inhalation medications.

## 2018-03-03 NOTE — Progress Notes (Signed)
HPI female never smoker followed for OSA, allergic rhinitis, asthma, complicated by A. Fib/Coumadin, HBP, GERD, obesity NPSG prior to EMR in 2012 Office Spirometry 09/02/16-WNL. FVC 2.06/76%, FEV1 1.57/77%, ratio 0.76, FEF 25-75% 1.26/80% Allergy labs 09/02/16- eosinophils 200, total IgE 4230 causing elevation of all specific allergen antibody levels including Aspergillus. Nucala  + 2018 PFT-10/13/16-minimal obstruction without response to dilator, minimal reduction of diffusion. FVC 2.02/77%, FEV1 1.62/83%, ratio 0.80, TLC 92%, DLCO 77% CT soft tissue neck 09/22/16-1. 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 Subsegmental atelectasis and nonspecific linear patchy densities in the lungs as described. They have a benign appearance.  Small hyperdense masses in the mediastinum are stable compared with 2015 supporting benign etiology such as ectopic thyroid tissue. Three-vessel prominent coronary artery calcification. Bibasilar bronchiolectasis. ------------------------------------------------------------------------------------------- 10/28/2017- 77 year old female never smoker followed for OSA, allergic rhinitis, asthma/ Nucala, complicated by A. Fib/Coumadin, HBP, GERD, obesity CPAP 9/Advanced Download 100% compliance AHI 2.4/hour Nucala  Anoro, Singulair, Neb Duoneb/ Pulmicort, Hydromet cough syrup, oPrilosec/Pepcid   pulmonary Rehab Labs 09/20/2017-WBC 14,900, hemoglobin 11.2, eosinophils absent, lymphocytes low( ?steroids or viral?), BNP 220, CMET wnl ------ Asthma: Pt states she continues to have cough-at times its due to GERD and other times feels that it is not.  Would like to have Rx refill on Hydromet-last filled 09-16-17.  Some days is more aware of acid reflux. She recognizes episodes when she goes into atrial fib, once triggered by light exertion in her garden.  Other times she recognizes acid reflux.  Episode of "bad sore  throat" last week treated by Dr. Manuella Ghazi with Z-Pak and cortisone injection.  Residual postnasal drip and some pressure sensation left retro-orbital.  Thinks Trelegy helps.  Head of bed is up.  Started pulmonary rehabilitation at [facility] with no change in exertional dyspnea yet. CXR 09/20/2017 IMPRESSION: No active cardiopulmonary disease.  03/03/2018-  77 year old female never smoker followed for OSA, allergic rhinitis, asthma/ Nucala, complicated by A. Fib/Coumadin, HBP, GERD, obesity CPAP 9/Advanced -----OSA: DME Pt wears CPAP nightly and DL attached. Pt notes increased SOB and leg edema-had to stop Pulmonary Rehab due to "pulled muscle" and SOB.  Persistent atrial fibrillation managed now with Tikosyn and followed by cardiology. Download 100% compliance AHI 2.6/hour She has gained about 3 pounds recently and noticing a little more tightness in her ankles.  Pending cardiology follow-up. Increased dry cough so she has started using nebulized Pulmicort more often-discussed. Like to pulmonary rehab but is on temporary hiatus after hurting shoulder and hip. Waking in the morning with nasal congestion since indoor heat came on.  Blames some occasional queasiness since Thanksgiving on postnasal drip but without headache, purulent discharge or obvious infection.  ROS-see HPI   + = positive Constitutional:   No-   weight loss, night sweats, fevers, chills, fatigue, lassitude. HEENT:   No-  headaches, difficulty swallowing, tooth/dental problems, sore throat,       No-  sneezing, itching,  +ear ache,  +nasal congestion, +post nasal drip,  CV:  No-   chest pain, orthopnea, PND, swelling in lower extremities, anasarca, dizziness, palpitations Resp: +  shortness of breath with exertion or at rest.                productive cough,  + non-productive cough,  No- coughing up of blood.              No-change in color of mucus. + wheezing.   Skin: No-   rash  or lesions. GI:  No-   heartburn, indigestion,  abdominal pain, nausea, vomiting, GU:  MS:  No-   joint pain or swelling. . Neuro-     nothing unusual Psych:  No- change in mood or affect. No depression or anxiety.  No memory loss.  OBJ- Physical Exam General- Alert, Oriented, Affect-appropriate, Distress- none acute, + obese Skin- rash-none, lesions- none, excoriation- none Lymphadenopathy- none Head- atraumatic            Eyes- Gross vision intact, PERRLA, conjunctivae and secretions clear            Ears- Hearing, canals-normal            Nose- +turbinate edema, no-Septal dev, mucus, polyps, erosion, perforation             Throat- Mallampati III-IV , mucosa+ thrush,  drainage- none, tonsils- atrophic Neck- flexible , trachea midline, no stridor , thyroid nl, carotid no bruit Chest - symmetrical excursion , unlabored           Heart/CV- RR/ very faint (no pacemaker) , no murmur , no gallop  , no rub, nl  s1 s2                  - JVD- none , edema+trace, stasis changes- none, varices- none           Lung-  wheeze-none , cough-none, +few crackles R base/ unlabored                        dullness-none, rub- none           Chest wall-  Abd- Br/ Gen/ Rectal- Not done, not indicated Extrem- cyanosis- none, clubbing, none, atrophy- none, strength- nl Neuro- grossly intact to observation

## 2018-03-03 NOTE — Assessment & Plan Note (Signed)
Her body habitus continues to predict and obesity hypoventilation component.  CPAP will help with this during sleep.  Ultimately weight loss goal is encouraged.

## 2018-03-03 NOTE — Patient Instructions (Addendum)
Ok to continue CPAP 9, mask of  Choice, humidifier, supplies, AirView  Ok to continue current breathing meds  Ok to use Pulmicort by itself sometimes in nebulizer, and ok to split an ampule of your neb solution, just using 1/2 dose as a neb treatment sometimes, to reduce jitters.  Try using otc nasal saline gel (NeilMed, AYR or store-brand) as needed at bedtime for dry nose.  Please call if we can help

## 2018-03-03 NOTE — Assessment & Plan Note (Signed)
Download confirms ongoing excellent compliance and control.  She is comfortable with the pressure.  We discussed use of nasal saline gel to help with drying now that indoor heat is on but she can also adjust her humidifier. Plan-continue CPAP 9

## 2018-03-04 ENCOUNTER — Encounter: Payer: Self-pay | Admitting: Internal Medicine

## 2018-03-04 ENCOUNTER — Ambulatory Visit (INDEPENDENT_AMBULATORY_CARE_PROVIDER_SITE_OTHER): Payer: Medicare Other | Admitting: Internal Medicine

## 2018-03-04 VITALS — BP 126/60 | HR 62 | Ht 63.0 in | Wt 201.0 lb

## 2018-03-04 DIAGNOSIS — I4819 Other persistent atrial fibrillation: Secondary | ICD-10-CM

## 2018-03-04 DIAGNOSIS — I10 Essential (primary) hypertension: Secondary | ICD-10-CM

## 2018-03-04 NOTE — Patient Instructions (Signed)
Medication Instructions:  Continue all current medications.  Labwork: none  Testing/Procedures: none  Follow-Up: 6 months   Any Other Special Instructions Will Be Listed Below (If Applicable). 2 gm low sodium diet   If you need a refill on your cardiac medications before your next appointment, please call your pharmacy.

## 2018-03-04 NOTE — Progress Notes (Signed)
PCP: Monico Blitz, MD Primary Cardiologist: Dr Domenic Polite Primary EP: Dr Rayann Heman  [name] [name] is a 77 y.o. female who presents today for routine electrophysiology followup.  Since last being seen in our clinic, the patient reports doing very well.  Stable SOB.  She has noticed increased edema over the holidays.  Today, she denies symptoms of palpitations, chest pain, dizziness, presyncope, or syncope.  The patient is otherwise without complaint today.   Past Medical History:  Diagnosis Date  . Allergic rhinitis   . Anxiety   . Asthma   . Atrial flutter ([place])   . COPD (chronic obstructive pulmonary disease) ([place])   . Depression   . Essential hypertension   . Hyperlipidemia   . Lumbar disc disease   . Obesity   . OSA (obstructive sleep apnea)    CPAP  . Paroxysmal atrial fibrillation (HCC)    Element of tachycardia bradycardia syndrome  . Respiratory failure [facility])    Past Surgical History:  Procedure Laterality Date  . ANTERIOR FUSION LUMBAR SPINE    . BIOPSY  08/27/2016   Procedure: BIOPSY;  Surgeon: Rogene Houston, MD;  Location: [facility]O SU[facility];  Service: Endoscopy;;  FOUR GASTRIC POLYPS BIOPSIED  . CARDIOVERSION N/A 03/28/2014   Procedure: CARDIOVERSION;  Surgeon: Fay Records, MD;  Location: [facility] ORS;  Service: Cardiovascular;  Laterality: N/A;  . CARDIOVERSION N/A 10/22/2016   Procedure: CARDIOVERSION;  Surgeon: Sueanne Margarita, MD;  Location: [facility] ENDOSCOPY;  Service: Cardiovascular;  Laterality: N/A;  . CATARACT EXTRACTION    . COLONOSCOPY N/A 08/04/2012   Procedure: COLONOSCOPY;  Surgeon: Rogene Houston, MD;  Location: [facility];  Service: Endoscopy;  Laterality: N/A;  730-rescheduled to [place] notified pt  . ELECTROPHYSIOLOGIC STUDY N/A 09/18/2014   Procedure: Atrial Fibrillation Ablation;  Surgeon: Thompson Grayer, MD;  Location: [place] CV LAB;  Service: Cardiovascular;  Laterality: N/A;  . ESOPHAGOGASTRODUODENOSCOPY N/A 08/27/2016   Procedure:  ESOPHAGOGASTRODUODENOSCOPY (EGD);  Surgeon: Rogene Houston, MD;  Location: [facility];  Service: Endoscopy;  Laterality: N/A;  1200  . LASIK     Both eyes  . TEE WITHOUT CARDIOVERSION N/A 09/18/2014   Procedure: TRANSESOPHAGEAL ECHOCARDIOGRAM (TEE);  Surgeon: Larey Dresser, MD;  Location: [place];  Service: Cardiovascular;  Laterality: N/A;  . TEE WITHOUT CARDIOVERSION N/A 10/22/2016   Procedure: TRANSESOPHAGEAL ECHOCARDIOGRAM (TEE);  Surgeon: Sueanne Margarita, MD;  Location: [facility] ENDOSCOPY;  Service: Cardiovascular;  Laterality: N/A;  . TEE WITHOUT CARDIOVERSION N/A 08/24/2017   Procedure: TRANSESOPHAGEAL ECHOCARDIOGRAM (TEE);  Surgeon: Josue Hector, MD;  Location: [facility] ENDOSCOPY;  Service: Cardiovascular;  Laterality: N/A;  . TOTAL HIP ARTHROPLASTY  2010   Right    ROS- all systems are reviewed and negatives except as per HPI above  Current Outpatient Medications  Medication Sig Dispense Refill  . acetaminophen (TYLENOL) 500 MG tablet Take 500 mg by mouth daily as needed for headache.    Marland Kitchen antiseptic oral rinse (BIOTENE) LIQD 15 mLs by Mouth Rinse route as needed for dry mouth.    Marland Kitchen atorvastatin (LIPITOR) 10 MG tablet Take 10 mg by mouth every evening.     . baclofen (LIORESAL) 10 MG tablet Take 10 mg by mouth 2 (two) times daily as needed for muscle spasms.     . benzonatate (TESSALON) 200 MG capsule Take 1 capsule (200 mg total) by mouth 3 (three) times daily as needed for cough. 30 capsule 3  . Biotin 1 MG CAPS  Take 1,000 mg by mouth daily.    . budesonide (PULMICORT) 0.5 MG/2ML nebulizer solution Take 0.5 mg by nebulization 2 (two) times daily.     . Calcium Carbonate-Vit D-Min (QC CALCIUM-MAGNESIUM-ZINC-D3) 333.4-133 MG-UNIT TABS Take 1 tablet by mouth 2 (two) times daily.     . cetirizine (ZYRTEC) 10 MG tablet Take 10 mg by mouth daily as needed for allergies.    . clindamycin (CLEOCIN) 300 MG capsule 2 capsules as needed. Take prior to dental procedures  0  .  Dextromethorphan-guaiFENesin (MUCINEX DM) 30-600 MG TB12 Take 1-2 tablets by mouth every 12 (twelve) hours as needed.    . diltiazem (CARDIZEM) 30 MG tablet Take 30 mg by mouth as needed (for palpitations).    Marland Kitchen diltiazem (CARTIA XT) 120 MG 24 hr capsule Take 120 mg by mouth at bedtime.     . dofetilide (TIKOSYN) 500 MCG capsule Take 1 capsule (500 mcg total) by mouth 2 (two) times daily. 180 capsule 3  . famotidine (PEPCID) 20 MG tablet Take 1 tablet (20 mg total) by mouth at bedtime. 30 tablet 2  . fluticasone (FLONASE) 50 MCG/ACT nasal spray Place 1 spray into both nostrils 2 (two) times daily.    Marland Kitchen HYDROcodone-homatropine (HYDROMET) 5-1.5 MG/5ML syrup Take 5 mLs by mouth every 6 (six) hours as needed for cough. 120 mL 0  . ipratropium-albuterol (DUONEB) 0.5-2.5 (3) MG/3ML SOLN Take 3 mLs by nebulization every 6 (six) hours as needed.     Marland Kitchen losartan (COZAAR) 100 MG tablet Take 1 tablet (100 mg total) by mouth daily. 90 tablet 2  . Magnesium 250 MG TABS Take 250 mg by mouth daily.    . montelukast (SINGULAIR) 10 MG tablet Take 10 mg by mouth at bedtime.    Marland Kitchen omeprazole (PRILOSEC) 20 MG capsule Take 20 mg by mouth daily.    . potassium chloride SA (K-DUR,KLOR-CON) 20 MEQ tablet Take 1 tablet (20 mEq total) by mouth daily. 30 tablet 6  . sodium chloride (OCEAN) 0.65 % SOLN nasal spray Place 1 spray into both nostrils 2 (two) times daily.     Marland Kitchen triamcinolone ointment (KENALOG) 0.1 % Apply 1 application topically daily as needed for rash.  2  . umeclidinium-vilanterol (ANORO ELLIPTA) 62.5-25 MCG/INH AEPB Inhale 1 puff into the lungs daily. 3 each 3  . WARFARIN SODIUM PO Take by mouth as directed.    . Wheat Dextrin (BENEFIBER ON THE GO) POWD Take 1 Dose by mouth daily as needed (constipation).      Current Facility-Administered Medications  Medication Dose Route Frequency Provider Last Rate Last Dose  . Mepolizumab SOLR 100 mg  100 mg Subcutaneous Q28 days Annamaria Boots, Clinton D, MD   100 mg at 02/16/18  1202    Physical Exam: Vitals:   03/04/18 0802  BP: 126/60  Pulse: 62  SpO2: 98%  Weight: 201 lb (91.2 kg)  Height: 5\' 3"  (1.6 m)    GEN- The patient is well appearing, alert and oriented x 3 today.   Head- normocephalic, atraumatic Eyes-  Sclera clear, conjunctiva pink Ears- hearing intact Oropharynx- clear Lungs- Clear to ausculation bilaterally, normal work of breathing Heart- Regular rate and rhythm, no murmurs, rubs or gallops, PMI not laterally displaced GI- soft, NT, ND, + BS Extremities- no clubbing, cyanosis, or edema  Wt Readings from Last 3 Encounters:  03/04/18 201 lb (91.2 kg)  03/03/18 204 lb 9.6 oz (92.8 kg)  12/27/17 201 lb (91.2 kg)    EKG tracing ordered  today is personally reviewed and shows sinus rhythm 60 bpm, PR 140 msec, QRS 90 msec, Qtc 488 msec  Assessment and Plan:  1. Persistent afib/ atrail flutter Doing well with tikosyn Bmet, mg from 12/28/17  2. HTN Stable No change required today 2 gram sodium restriction advised  3. Obesity Body mass index is 35.61 kg/m. Lifestyle modification again encouraged  4. COPD/ SOB Stable followedby Dr Annamaria Boots No change required today  Return to see Dr Domenic Polite as scheduled in March I will see again in 6 months  Thompson Grayer MD, [facility] 03/04/2018 9:01 AM

## 2018-03-07 DIAGNOSIS — Z6837 Body mass index (BMI) 37.0-37.9, adult: Secondary | ICD-10-CM | POA: Diagnosis not present

## 2018-03-07 DIAGNOSIS — I10 Essential (primary) hypertension: Secondary | ICD-10-CM | POA: Diagnosis not present

## 2018-03-07 DIAGNOSIS — I4891 Unspecified atrial fibrillation: Secondary | ICD-10-CM | POA: Diagnosis not present

## 2018-03-07 DIAGNOSIS — Z299 Encounter for prophylactic measures, unspecified: Secondary | ICD-10-CM | POA: Diagnosis not present

## 2018-03-08 ENCOUNTER — Encounter (HOSPITAL_COMMUNITY)
Admission: RE | Admit: 2018-03-08 | Discharge: 2018-03-08 | Disposition: A | Discharge status: Home / Self Care | Payer: Medicare Other | Source: Ambulatory Visit | Attending: Internal Medicine | Admitting: Internal Medicine

## 2018-03-08 DIAGNOSIS — I4892 Unspecified atrial flutter: Secondary | ICD-10-CM | POA: Diagnosis not present

## 2018-03-08 DIAGNOSIS — I480 Paroxysmal atrial fibrillation: Secondary | ICD-10-CM | POA: Diagnosis not present

## 2018-03-08 DIAGNOSIS — J4550 Severe persistent asthma, uncomplicated: Secondary | ICD-10-CM | POA: Diagnosis not present

## 2018-03-08 DIAGNOSIS — F419 Anxiety disorder, unspecified: Secondary | ICD-10-CM | POA: Diagnosis not present

## 2018-03-08 DIAGNOSIS — G4733 Obstructive sleep apnea (adult) (pediatric): Secondary | ICD-10-CM | POA: Diagnosis not present

## 2018-03-08 DIAGNOSIS — F329 Major depressive disorder, single episode, unspecified: Secondary | ICD-10-CM | POA: Diagnosis not present

## 2018-03-08 NOTE — Progress Notes (Signed)
Daily Session Note  Patient Details  Name: [name] [name] MRN: [id number] Date of Birth: 07-02-1940 Referring Provider:     PULMONARY REHAB OTHER RESPIRATORY from 10/01/2017 in [place]  Referring Provider  Dr. Annamaria Boots      Encounter Date: 03/08/2018  Check In: Session Check In - 03/08/18 1330      Check-In   Supervising physician immediately available to respond to emergencies  See telemetry face sheet for immediately available MD    Location  [facility]    Staff Present  Russella Dar, MS, EP, [facility], Exercise Physiologist;Etheridge Geil Zachery Conch, Exercise Physiologist    Medication changes reported      No    Fall or balance concerns reported     No    Warm-up and Cool-down  Performed as group-led instruction    Resistance Training Performed  Yes    VAD Patient?  No    PAD/SET Patient?  No      Pain Assessment   Currently in Pain?  No/denies    Pain Score  0-No pain    Multiple Pain Sites  No       Capillary Blood Glucose: No results found for this or any previous visit (from the past 24 hour(s)).    Social History   Tobacco Use  Smoking Status Never Smoker  Smokeless Tobacco Never Used    Goals Met:  Proper associated with RPD/PD & O2 Sat Independence with exercise equipment Using PLB without cueing & demonstrates good technique Exercise tolerated well No report of cardiac concerns or symptoms Strength training completed today  Goals Unmet:  Not Applicable  Comments: Pt able to follow exercise prescription today without complaint.  Will continue to monitor for progression. Check out 1430.   Dr. Sinda Du is Medical Director for [facility].

## 2018-03-09 ENCOUNTER — Ambulatory Visit (HOSPITAL_COMMUNITY)
Admission: RE | Admit: 2018-03-09 | Discharge: 2018-03-09 | Disposition: A | Discharge status: Home / Self Care | Payer: Medicare Other | Source: Ambulatory Visit | Attending: Internal Medicine | Admitting: Internal Medicine

## 2018-03-09 DIAGNOSIS — Z1231 Encounter for screening mammogram for malignant neoplasm of breast: Secondary | ICD-10-CM | POA: Insufficient documentation

## 2018-03-10 ENCOUNTER — Encounter (HOSPITAL_COMMUNITY)
Admission: RE | Admit: 2018-03-10 | Discharge: 2018-03-10 | Disposition: A | Discharge status: Home / Self Care | Payer: Medicare Other | Source: Ambulatory Visit | Attending: Internal Medicine | Admitting: Internal Medicine

## 2018-03-10 VITALS — Ht 63.0 in | Wt 204.4 lb

## 2018-03-10 DIAGNOSIS — I4892 Unspecified atrial flutter: Secondary | ICD-10-CM | POA: Diagnosis not present

## 2018-03-10 DIAGNOSIS — M9901 Segmental and somatic dysfunction of cervical region: Secondary | ICD-10-CM | POA: Diagnosis not present

## 2018-03-10 DIAGNOSIS — G4733 Obstructive sleep apnea (adult) (pediatric): Secondary | ICD-10-CM | POA: Diagnosis not present

## 2018-03-10 DIAGNOSIS — M545 Low back pain: Secondary | ICD-10-CM | POA: Diagnosis not present

## 2018-03-10 DIAGNOSIS — J4550 Severe persistent asthma, uncomplicated: Secondary | ICD-10-CM

## 2018-03-10 DIAGNOSIS — M9905 Segmental and somatic dysfunction of pelvic region: Secondary | ICD-10-CM | POA: Diagnosis not present

## 2018-03-10 DIAGNOSIS — F419 Anxiety disorder, unspecified: Secondary | ICD-10-CM | POA: Diagnosis not present

## 2018-03-10 DIAGNOSIS — M9903 Segmental and somatic dysfunction of lumbar region: Secondary | ICD-10-CM | POA: Diagnosis not present

## 2018-03-10 DIAGNOSIS — M542 Cervicalgia: Secondary | ICD-10-CM | POA: Diagnosis not present

## 2018-03-10 DIAGNOSIS — F329 Major depressive disorder, single episode, unspecified: Secondary | ICD-10-CM | POA: Diagnosis not present

## 2018-03-10 DIAGNOSIS — M546 Pain in thoracic spine: Secondary | ICD-10-CM | POA: Diagnosis not present

## 2018-03-10 DIAGNOSIS — M9902 Segmental and somatic dysfunction of thoracic region: Secondary | ICD-10-CM | POA: Diagnosis not present

## 2018-03-10 DIAGNOSIS — I480 Paroxysmal atrial fibrillation: Secondary | ICD-10-CM | POA: Diagnosis not present

## 2018-03-10 NOTE — Progress Notes (Signed)
Daily Session Note  Patient Details  Name: [name] [name] MRN: [id number] Date of Birth: 12/05/1940 Referring Provider:     PULMONARY REHAB OTHER RESPIRATORY from 10/01/2017 in [facility]  Referring Provider  Dr. Young      Encounter Date: 03/10/2018  Check In: Session Check In - 03/10/18 1330      Check-In   Supervising physician immediately available to respond to emergencies  See telemetry face sheet for immediately available MD    Staff Present  Diane Coad, MS, EP, CHC, Exercise Physiologist;Amanda Ballard, Exercise Physiologist;Debra Johnson, RN, BSN    Medication changes reported      No    Fall or balance concerns reported     No    Warm-up and Cool-down  Performed as group-led instruction    Resistance Training Performed  Yes    VAD Patient?  No    PAD/SET Patient?  No      Pain Assessment   Currently in Pain?  No/denies    Pain Score  8     Pain Location  Coccyx    Pain Descriptors / Indicators  Aching    Pain Type  Acute pain    Pain Radiating Towards  No radiation     Pain Onset  Today    Pain Frequency  Rarely    Aggravating Factors   Walking, sitting     Pain Relieving Factors  Rest     Effect of Pain on Daily Activities  limits mobility in lower extremities     Multiple Pain Sites  No       Capillary Blood Glucose: No results found for this or any previous visit (from the past 24 hour(s)).    Social History   Tobacco Use  Smoking Status Never Smoker  Smokeless Tobacco Never Used    Goals Met:  Proper associated with RPD/PD & O2 Sat Independence with exercise equipment Using PLB without cueing & demonstrates good technique Exercise tolerated well No report of cardiac concerns or symptoms Strength training completed today  Goals Unmet:  Not Applicable  Comments: Pt able to follow exercise prescription today without complaint.  Will continue to monitor for progression. Check out 2:30.   Dr. Edward Hawkins is  Medical Director for  Pulmonary Rehab. 

## 2018-03-14 ENCOUNTER — Telehealth: Payer: Self-pay | Admitting: Internal Medicine

## 2018-03-14 DIAGNOSIS — M9903 Segmental and somatic dysfunction of lumbar region: Secondary | ICD-10-CM | POA: Diagnosis not present

## 2018-03-14 DIAGNOSIS — M542 Cervicalgia: Secondary | ICD-10-CM | POA: Diagnosis not present

## 2018-03-14 DIAGNOSIS — M9905 Segmental and somatic dysfunction of pelvic region: Secondary | ICD-10-CM | POA: Diagnosis not present

## 2018-03-14 DIAGNOSIS — M546 Pain in thoracic spine: Secondary | ICD-10-CM | POA: Diagnosis not present

## 2018-03-14 DIAGNOSIS — M9901 Segmental and somatic dysfunction of cervical region: Secondary | ICD-10-CM | POA: Diagnosis not present

## 2018-03-14 DIAGNOSIS — M9902 Segmental and somatic dysfunction of thoracic region: Secondary | ICD-10-CM | POA: Diagnosis not present

## 2018-03-14 DIAGNOSIS — M545 Low back pain: Secondary | ICD-10-CM | POA: Diagnosis not present

## 2018-03-14 NOTE — Telephone Encounter (Signed)
1 Vial Order Date: 03/14/18 Shipping Date: 03/14/18

## 2018-03-15 NOTE — Telephone Encounter (Signed)
1 vial Arrival Date:03/15/18 Lot #: [id number] Exp Date:07/2021

## 2018-03-18 ENCOUNTER — Ambulatory Visit (INDEPENDENT_AMBULATORY_CARE_PROVIDER_SITE_OTHER): Payer: Medicare Other

## 2018-03-18 DIAGNOSIS — J4550 Severe persistent asthma, uncomplicated: Secondary | ICD-10-CM | POA: Diagnosis not present

## 2018-03-18 DIAGNOSIS — J4520 Mild intermittent asthma, uncomplicated: Secondary | ICD-10-CM

## 2018-03-18 MED ORDER — MEPOLIZUMAB 100 MG ~~LOC~~ SOLR
100.0000 mg | Freq: Once | SUBCUTANEOUS | Status: AC
  Administered 2018-03-18: 100 mg via SUBCUTANEOUS

## 2018-03-18 NOTE — Progress Notes (Signed)
Pulmonary Individual Treatment Plan  Patient Details  Name: [name] [name] MRN: [id number] Date of Birth: 1940/05/22 Referring Provider:     PULMONARY REHAB OTHER RESPIRATORY from 10/01/2017 in [facility] [place]  Referring Provider  Dr. Annamaria Boots      Initial Encounter Date:    PULMONARY REHAB OTHER RESPIRATORY from 10/01/2017 in [place]  Date  10/01/17      Visit Diagnosis: Asthma in adult, severe persistent, uncomplicated  Patient's Home Medications on Admission:   Current Outpatient Medications:  .  acetaminophen (TYLENOL) 500 MG tablet, Take 500 mg by mouth daily as needed for headache., Disp: , Rfl:  .  antiseptic oral rinse (BIOTENE) LIQD, 15 mLs by Mouth Rinse route as needed for dry mouth., Disp: , Rfl:  .  atorvastatin (LIPITOR) 10 MG tablet, Take 10 mg by mouth every evening. , Disp: , Rfl:  .  baclofen (LIORESAL) 10 MG tablet, Take 10 mg by mouth 2 (two) times daily as needed for muscle spasms. , Disp: , Rfl:  .  benzonatate (TESSALON) 200 MG capsule, Take 1 capsule (200 mg total) by mouth 3 (three) times daily as needed for cough., Disp: 30 capsule, Rfl: 3 .  Biotin 1 MG CAPS, Take 1,000 mg by mouth daily., Disp: , Rfl:  .  budesonide (PULMICORT) 0.5 MG/2ML nebulizer solution, Take 0.5 mg by nebulization 2 (two) times daily. , Disp: , Rfl:  .  Calcium Carbonate-Vit D-Min (QC CALCIUM-MAGNESIUM-ZINC-D3) 333.4-133 MG-UNIT TABS, Take 1 tablet by mouth 2 (two) times daily. , Disp: , Rfl:  .  cetirizine (ZYRTEC) 10 MG tablet, Take 10 mg by mouth daily as needed for allergies., Disp: , Rfl:  .  clindamycin (CLEOCIN) 300 MG capsule, 2 capsules as needed. Take prior to dental procedures, Disp: , Rfl: 0 .  Dextromethorphan-guaiFENesin (MUCINEX DM) 30-600 MG TB12, Take 1-2 tablets by mouth every 12 (twelve) hours as needed., Disp: , Rfl:  .  diltiazem (CARDIZEM) 30 MG tablet, Take 30 mg by mouth as needed (for palpitations)., Disp: , Rfl:  .   diltiazem (CARTIA XT) 120 MG 24 hr capsule, Take 120 mg by mouth at bedtime. , Disp: , Rfl:  .  dofetilide (TIKOSYN) 500 MCG capsule, Take 1 capsule (500 mcg total) by mouth 2 (two) times daily., Disp: 180 capsule, Rfl: 3 .  famotidine (PEPCID) 20 MG tablet, Take 1 tablet (20 mg total) by mouth at bedtime., Disp: 30 tablet, Rfl: 2 .  fluticasone (FLONASE) 50 MCG/ACT nasal spray, Place 1 spray into both nostrils 2 (two) times daily., Disp: , Rfl:  .  HYDROcodone-homatropine (HYDROMET) 5-1.5 MG/5ML syrup, Take 5 mLs by mouth every 6 (six) hours as needed for cough., Disp: 120 mL, Rfl: 0 .  ipratropium-albuterol (DUONEB) 0.5-2.5 (3) MG/3ML SOLN, Take 3 mLs by nebulization every 6 (six) hours as needed. , Disp: , Rfl:  .  losartan (COZAAR) 100 MG tablet, Take 1 tablet (100 mg total) by mouth daily., Disp: 90 tablet, Rfl: 2 .  Magnesium 250 MG TABS, Take 250 mg by mouth daily., Disp: , Rfl:  .  montelukast (SINGULAIR) 10 MG tablet, Take 10 mg by mouth at bedtime., Disp: , Rfl:  .  omeprazole (PRILOSEC) 20 MG capsule, Take 20 mg by mouth daily., Disp: , Rfl:  .  potassium chloride SA (K-DUR,KLOR-CON) 20 MEQ tablet, Take 1 tablet (20 mEq total) by mouth daily., Disp: 30 tablet, Rfl: 6 .  sodium chloride (OCEAN) 0.65 % SOLN nasal spray, Place  1 spray into both nostrils 2 (two) times daily. , Disp: , Rfl:  .  triamcinolone ointment (KENALOG) 0.1 %, Apply 1 application topically daily as needed for rash., Disp: , Rfl: 2 .  umeclidinium-vilanterol (ANORO ELLIPTA) 62.5-25 MCG/INH AEPB, Inhale 1 puff into the lungs daily., Disp: 3 each, Rfl: 3 .  WARFARIN SODIUM PO, Take by mouth as directed., Disp: , Rfl:  .  Wheat Dextrin (BENEFIBER ON THE GO) POWD, Take 1 Dose by mouth daily as needed (constipation). , Disp: , Rfl:   Past Medical History: Past Medical History:  Diagnosis Date  . Allergic rhinitis   . Anxiety   . Asthma   . Atrial flutter ([place])   . COPD (chronic obstructive pulmonary disease) ([place])   .  Depression   . Essential hypertension   . Hyperlipidemia   . Lumbar disc disease   . Obesity   . OSA (obstructive sleep apnea)    CPAP  . Paroxysmal atrial fibrillation ([facility])    Element of tachycardia bradycardia syndrome  . Respiratory failure (HCC)     Tobacco Use: Social History   Tobacco Use  Smoking Status Never Smoker  Smokeless Tobacco Never Used    Labs: Recent Review Flowsheet Data    There is no flowsheet data to display.      Capillary Blood Glucose: No results found for: GLUCAP   Pulmonary Assessment Scores: Pulmonary Assessment Scores    Row Name 10/01/17 1124 03/18/18 1341       ADL UCSD   ADL Phase  Entry  Exit    SOB Score total  46  56    Rest  0  1    Walk  9  8    Stairs  4  4    Bath  2  2    Dress  2  2    Shop  2  3      CAT Score   CAT Score  19  16      mMRC Score   mMRC Score  3  3       Pulmonary Function Assessment: Pulmonary Function Assessment - 10/01/17 1123      Pulmonary Function Tests   FVC%  79 %    FEV1%  78 %    FEV1/FVC Ratio  99    RV%  92 %    DLCO%  81 %      Initial Spirometry Results   FVC%  79 %    FEV1%  78 %    FEV1/FVC Ratio  99      Post Bronchodilator Spirometry Results   FVC%  77 %    FEV1%  83 %    FEV1/FVC Ratio  108      Breath   Bilateral Breath Sounds  Clear    Shortness of Breath  Yes       Exercise Target Goals: Exercise Program Goal: Individual exercise prescription set using results from initial 6 min walk test and THRR while considering  patient's activity barriers and safety.   Exercise Prescription Goal: Initial exercise prescription builds to 30-45 minutes a day of aerobic activity, 2-3 days per week.  Home exercise guidelines will be given to patient during program as part of exercise prescription that the participant will acknowledge.  Activity Barriers & Risk Stratification: Activity Barriers & Cardiac Risk Stratification - 10/01/17 1007      Activity Barriers &  Cardiac Risk Stratification   Activity Barriers  Back  Problems;Deconditioning;Shortness of Breath;Left Hip Replacement;Right Hip Replacement;Other (comment)    Comments  Patient great toe and 2nd toes are crossed. has been told by her doctor not to do activities that make her go up on her toes.     Cardiac Risk Stratification  Moderate       6 Minute Walk: 6 Minute Walk    Row Name 10/01/17 1006 03/10/18 1419       6 Minute Walk   Phase  Initial  Discharge    Distance  900 feet  850 feet    Distance % Change  0 %  -5 %    Distance Feet Change  0 ft  50 ft    Walk Time  6 minutes  6 minutes    # of Rest Breaks  0  0    MPH  1.7  1.61    METS  2.3  1.77    RPE  13  11    Perceived Dyspnea   14  13    VO2 Peak  6.24  5.3    Symptoms  Yes (comment)  Yes (comment)    Comments  Patient had to push a wheelchair half way through walk test due to extreme shortness of breath  8/10 coccyx pain, leg tiredness, 7/10 R knee pain - subsided with rest     Resting HR  62 bpm  66 bpm    Resting BP  124/52  142/62    Resting Oxygen Saturation   97 %  97 %    Exercise Oxygen Saturation  during 6 min walk  98 %  98 %    Max Ex. HR  97 bpm  101 bpm    Max Ex. BP  200/62  170/58    2 Minute Post BP  154/60  140/66       Oxygen Initial Assessment: Oxygen Initial Assessment - 10/01/17 1127      Home Oxygen   Home Oxygen Device  None    Sleep Oxygen Prescription  None    Home Exercise Oxygen Prescription  None    Home at Rest Exercise Oxygen Prescription  None    Compliance with Home Oxygen Use  --   N/A     Initial 6 min Walk   Oxygen Used  None      Program Oxygen Prescription   Program Oxygen Prescription  None       Oxygen Re-Evaluation: Oxygen Re-Evaluation    Row Name 11/04/17 0742 11/24/17 1504 12/16/17 0740 01/06/18 0926 02/03/18 0803     Program Oxygen Prescription   Program Oxygen Prescription  None  None  None  None  None     Home Oxygen   Home Oxygen Device  None   None  None  None  None   Sleep Oxygen Prescription  None  None  None  None  None   Home Exercise Oxygen Prescription  -  None  None  None  None   Home at Rest Exercise Oxygen Prescription  None  None  None  None  None   Compliance with Home Oxygen Use  Yes  Yes  Yes  Yes  Yes     Goals/Expected Outcomes   Short Term Goals  To learn and understand importance of maintaining oxygen saturations>88%;To learn and understand importance of monitoring SPO2 with pulse oximeter and demonstrate accurate use of the pulse oximeter.;To learn and demonstrate proper pursed lip breathing techniques or other breathing techniques.  To learn and understand importance of maintaining oxygen saturations>88%;To learn and understand importance of monitoring SPO2 with pulse oximeter and demonstrate accurate use of the pulse oximeter.;To learn and demonstrate proper pursed lip breathing techniques or other breathing techniques.  To learn and understand importance of maintaining oxygen saturations>88%;To learn and understand importance of monitoring SPO2 with pulse oximeter and demonstrate accurate use of the pulse oximeter.;To learn and demonstrate proper pursed lip breathing techniques or other breathing techniques.  To learn and understand importance of maintaining oxygen saturations>88%;To learn and understand importance of monitoring SPO2 with pulse oximeter and demonstrate accurate use of the pulse oximeter.;To learn and demonstrate proper pursed lip breathing techniques or other breathing techniques.  To learn and understand importance of maintaining oxygen saturations>88%;To learn and understand importance of monitoring SPO2 with pulse oximeter and demonstrate accurate use of the pulse oximeter.;To learn and demonstrate proper pursed lip breathing techniques or other breathing techniques.   Long  Term Goals  Maintenance of O2 saturations>88%;Verbalizes importance of monitoring SPO2 with pulse oximeter and return  demonstration;Exhibits proper breathing techniques, such as pursed lip breathing or other method taught during program session  Maintenance of O2 saturations>88%;Verbalizes importance of monitoring SPO2 with pulse oximeter and return demonstration;Exhibits proper breathing techniques, such as pursed lip breathing or other method taught during program session  Maintenance of O2 saturations>88%;Verbalizes importance of monitoring SPO2 with pulse oximeter and return demonstration;Exhibits proper breathing techniques, such as pursed lip breathing or other method taught during program session  Maintenance of O2 saturations>88%;Verbalizes importance of monitoring SPO2 with pulse oximeter and return demonstration;Exhibits proper breathing techniques, such as pursed lip breathing or other method taught during program session  Maintenance of O2 saturations>88%;Verbalizes importance of monitoring SPO2 with pulse oximeter and return demonstration;Exhibits proper breathing techniques, such as pursed lip breathing or other method taught during program session   Comments  Patient verbalizes understanding of maintaining O2 saturation >88% and is able to use pulse oximeter with return demonstration. She exhibits proper pursed lip breathing techniques during exercise.   Patient verbalizes understanding of maintaining O2 saturation >88% and is able to use pulse oximeter with return demonstration. She exhibits proper pursed lip breathing techniques during exercise.   Patient verbalizes understanding of maintaining O2 saturation >88% and is able to use pulse oximeter with return demonstration. She exhibits proper pursed lip breathing techniques during exercise.   Patient verbalizes understanding of maintaining O2 saturation >88% and is able to use pulse oximeter with return demonstration. She exhibits proper pursed lip breathing techniques during exercise.   Patient verbalizes understanding of maintaining O2 saturation >88% and is  able to use pulse oximeter with return demonstration. She exhibits proper pursed lip breathing techniques during exercise.    Goals/Expected Outcomes  Patient will continue to meet her short and long term goals.   Patient will continue to meet her short and long term goals.   Patient will continue to meet her short and long term goals.   Patient will continue to meet her short and long term goals.   Patient will continue to meet her short and long term goals.    [place] Name 02/28/18 1408             Program Oxygen Prescription   Program Oxygen Prescription  None         Home Oxygen   Home Oxygen Device  None       Sleep Oxygen Prescription  None       Home Exercise Oxygen Prescription  None       Home at Rest Exercise Oxygen Prescription  None       Compliance with Home Oxygen Use  Yes         Goals/Expected Outcomes   Short Term Goals  To learn and understand importance of maintaining oxygen saturations>88%;To learn and understand importance of monitoring SPO2 with pulse oximeter and demonstrate accurate use of the pulse oximeter.;To learn and demonstrate proper pursed lip breathing techniques or other breathing techniques.       Long  Term Goals  Maintenance of O2 saturations>88%;Verbalizes importance of monitoring SPO2 with pulse oximeter and return demonstration;Exhibits proper breathing techniques, such as pursed lip breathing or other method taught during program session       Comments  Patient verbalizes understanding of maintaining O2 saturation >88% and is able to use pulse oximeter with return demonstration. She exhibits proper pursed lip breathing techniques during exercise.        Goals/Expected Outcomes  Patient will continue to meet her short and long term goals.           Oxygen Discharge (Final Oxygen Re-Evaluation): Oxygen Re-Evaluation - 02/28/18 1408      Program Oxygen Prescription   Program Oxygen Prescription  None      Home Oxygen   Home Oxygen Device  None     Sleep Oxygen Prescription  None    Home Exercise Oxygen Prescription  None    Home at Rest Exercise Oxygen Prescription  None    Compliance with Home Oxygen Use  Yes      Goals/Expected Outcomes   Short Term Goals  To learn and understand importance of maintaining oxygen saturations>88%;To learn and understand importance of monitoring SPO2 with pulse oximeter and demonstrate accurate use of the pulse oximeter.;To learn and demonstrate proper pursed lip breathing techniques or other breathing techniques.    Long  Term Goals  Maintenance of O2 saturations>88%;Verbalizes importance of monitoring SPO2 with pulse oximeter and return demonstration;Exhibits proper breathing techniques, such as pursed lip breathing or other method taught during program session    Comments  Patient verbalizes understanding of maintaining O2 saturation >88% and is able to use pulse oximeter with return demonstration. She exhibits proper pursed lip breathing techniques during exercise.     Goals/Expected Outcomes  Patient will continue to meet her short and long term goals.        Initial Exercise Prescription: Initial Exercise Prescription - 10/01/17 1000      Date of Initial Exercise RX and Referring Provider   Date  10/01/17    Referring Provider  Dr. Annamaria Boots      NuStep   Level  1    SPM  77    Minutes  15    METs  1.8      Arm Ergometer   Level  1.2    Watts  16    RPM  22    Minutes  20    METs  2.2      Prescription Details   Frequency (times per week)  2    Duration  Progress to 30 minutes of continuous aerobic without signs/symptoms of physical distress      Intensity   THRR 40-80% of Max Heartrate  [phone]    Ratings of Perceived Exertion  11-13    Perceived Dyspnea  0-4      Progression   Progression  Continue progressive overload as per policy without signs/symptoms or physical distress.  Resistance Training   Training Prescription  Yes    Weight  1    Reps  10-15        Perform Capillary Blood Glucose checks as needed.  Exercise Prescription Changes:  Exercise Prescription Changes    Row Name 10/17/17 1200 11/02/17 0800 11/22/17 0700 12/03/17 1400 12/29/17 1400     Response to Exercise   Blood Pressure (Admit)  144/60  114/60  122/62  -  122/60   Blood Pressure (Exercise)  158/60  148/54  152/70  142/60  144/70   Blood Pressure (Exit)  146/60  118/60  132/62  120/68  115/60   Heart Rate (Admit)  64 bpm  61 bpm  58 bpm  63 bpm  61 bpm   Heart Rate (Exercise)  83 bpm  80 bpm  83 bpm  77 bpm  88 bpm   Heart Rate (Exit)  68 bpm  67 bpm  69 bpm  64 bpm  64 bpm   Oxygen Saturation (Admit)  97 %  98 %  96 %  96 %  96 %   Oxygen Saturation (Exercise)  97 %  96 %  90 %  96 %  97 %   Oxygen Saturation (Exit)  95 %  96 %  97 %  98 %  97 %   Rating of Perceived Exertion (Exercise)  '11  11  11  11  11   ' Perceived Dyspnea (Exercise)  '12  12  11  11  11   ' Duration  Progress to 30 minutes of  aerobic without signs/symptoms of physical distress  Progress to 30 minutes of  aerobic without signs/symptoms of physical distress  Progress to 30 minutes of  aerobic without signs/symptoms of physical distress  Progress to 30 minutes of  aerobic without signs/symptoms of physical distress  Progress to 30 minutes of  aerobic without signs/symptoms of physical distress   Intensity  THRR New 96-111-127  THRR New 93-110-126  THRR New 92-108-125  THRR unchanged  THRR unchanged     Progression   Progression  Continue to progress workloads to maintain intensity without signs/symptoms of physical distress.  Continue to progress workloads to maintain intensity without signs/symptoms of physical distress.  Continue to progress workloads to maintain intensity without signs/symptoms of physical distress.  Continue to progress workloads to maintain intensity without signs/symptoms of physical distress.  Continue to progress workloads to maintain intensity without signs/symptoms of physical  distress.   Average METs  2.45  2.55  2.8  2.1  2.65     Resistance Training   Training Prescription  Yes  Yes  Yes  Yes  Yes   Weight  '1  2  3  3  3   ' Reps  10-15  10-15  10-15  10-15  10-15   Time  5 Minutes  5 Minutes  5 Minutes  5 Minutes  5 Minutes     NuStep   Level  '1  2  2  1  3   ' SPM  99  100  110  108  101   Minutes  '15  15  17  17  17   ' METs  2.6  2.7  2.9  2.9  2.7     Arm Ergometer   Level  1.2  1.3  1.3  1.3  1.3   Watts  18  19  72  72  22   RPM  23  67  70  70  74   Minutes  '20  20  22  22  22   ' METs  2.3  2.4  2.7  2.7  2.6     Home Exercise Plan   Plans to continue exercise at  Home (comment)  Home (comment)  Home (comment)  Home (comment)  Home (comment)   Frequency  Add 3 additional days to program exercise sessions.  Add 3 additional days to program exercise sessions.  Add 3 additional days to program exercise sessions.  Add 3 additional days to program exercise sessions.  Add 3 additional days to program exercise sessions.   Initial Home Exercises Provided  10/18/17  10/18/17  10/18/17  10/18/17  10/18/17   Row Name 01/18/18 1300 02/03/18 1500 02/18/18 1400 03/03/18 0700       Response to Exercise   Blood Pressure (Admit)  140/58  144/50  122/50  124/64    Blood Pressure (Exercise)  162/64  190/60  140/50  172/68    Blood Pressure (Exit)  130/60  130/60  126/60  120/68    Heart Rate (Admit)  57 bpm  62 bpm  75 bpm  66 bpm    Heart Rate (Exercise)  88 bpm  88 bpm  91 bpm  87 bpm    Heart Rate (Exit)  68 bpm  65 bpm  90 bpm  77 bpm    Oxygen Saturation (Admit)  98 %  96 %  94 %  95 %    Oxygen Saturation (Exercise)  97 %  97 %  97 %  96 %    Oxygen Saturation (Exit)  97 %  96 %  97 %  97 %    Rating of Perceived Exertion (Exercise)  '11  11  11  11    ' Perceived Dyspnea (Exercise)  '11  11  11  11    ' Symptoms  -  -  paitent has been experiencing hip pain 7/10   -    Comments  increase in overall MET level  -  -  Only three more sessions!    Duration   Progress to 30 minutes of  aerobic without signs/symptoms of physical distress  Progress to 30 minutes of  aerobic without signs/symptoms of physical distress  Progress to 30 minutes of  aerobic without signs/symptoms of physical distress  Progress to 30 minutes of  aerobic without signs/symptoms of physical distress    Intensity  THRR unchanged  THRR unchanged  THRR unchanged  THRR unchanged      Progression   Progression  Continue to progress workloads to maintain intensity without signs/symptoms of physical distress.  Continue to progress workloads to maintain intensity without signs/symptoms of physical distress.  Continue to progress workloads to maintain intensity without signs/symptoms of physical distress.  Continue to progress workloads to maintain intensity without signs/symptoms of physical distress.    Average METs  2.85  2.9  2.3  2.7      Resistance Training   Training Prescription  Yes  Yes  Yes  Yes    Weight  '3  3  4  4    ' Reps  10-15  10-15  10-15  10-15    Time  5 Minutes  5 Minutes  5 Minutes  5 Minutes      NuStep   Level  '3  3  2 ' decreased due to hip pain   3    SPM  103  101  81  93    Minutes  '17  17  17  17    ' METs  2.7  2.8  1.8  2.4      Arm Ergometer   Level  '2  3  3  3    ' Watts  '26  25  24  25    ' RPM  64  54  53  55    Minutes  '22  22  22  22    ' METs  3  3  2.8  3      Home Exercise Plan   Plans to continue exercise at  Home (comment)  Home (comment)  Home (comment)  Home (comment)    Frequency  Add 3 additional days to program exercise sessions.  Add 3 additional days to program exercise sessions.  Add 3 additional days to program exercise sessions.  Add 3 additional days to program exercise sessions.    Initial Home Exercises Provided  10/18/17  10/18/17  10/18/17  10/18/17       Exercise Comments:  Exercise Comments    Row Name 11/02/17 [phone] 11/22/17 0738 12/03/17 1426 12/14/17 0808 01/04/18 1008   Exercise Comments  Patient is breathing better.  Able to do more and does feel like the program is helping her achieve her goals.   patient feels like the program is helping her. She has been going [facility] strength training classes at the [facility].   patient feels like the program is helping her. She has been going to strength training classes at the [facility].   Patient feels like the program is helping her. Aside from the recent set back with her neck, she feels she is gaining strength.   Patient is pleased with how the program is helping her and getting her back to being able to be more active in the church.    [place] Name 02/02/18 1447 02/22/18 0828         Exercise Comments  Patient has continued to increase her activity levels outside of rehab. She is busy helping in her church and working out in her yard.   Patient works hard in rehab but has been experiencing a set back with hip pain. She continues to keep busy outside of rehab with her church and things around the house.          Exercise Goals and Review:  Exercise Goals    Row Name 10/01/17 1009             Exercise Goals   Increase Physical Activity  Yes       Intervention  Develop an individualized exercise prescription for aerobic and resistive training based on initial evaluation findings, risk stratification, comorbidities and participant's personal goals.;Provide advice, education, support and counseling about physical activity/exercise needs.       Expected Outcomes  Short Term: Attend rehab on a regular basis to increase amount of physical activity.       Increase Strength and Stamina  Yes       Intervention  Provide advice, education, support and counseling about physical activity/exercise needs.;Develop an individualized exercise prescription for aerobic and resistive training based on initial evaluation findings, risk stratification, comorbidities and participant's personal goals.       Expected Outcomes  Short Term: Increase workloads from initial exercise prescription for resistance,  speed, and METs.       Able to understand and use rate of perceived exertion (RPE) scale  Yes  Intervention  Provide education and explanation on how to use RPE scale       Expected Outcomes  Short Term: Able to use RPE daily in rehab to express subjective intensity level;Long Term:  Able to use RPE to guide intensity level when exercising independently       Able to understand and use Dyspnea scale  Yes       Intervention  Provide education and explanation on how to use Dyspnea scale       Expected Outcomes  Short Term: Able to use Dyspnea scale daily in rehab to express subjective sense of shortness of breath during exertion;Long Term: Able to use Dyspnea scale to guide intensity level when exercising independently       Knowledge and understanding of Target Heart Rate Range (THRR)  Yes       Intervention  Provide education and explanation of THRR including how the numbers were predicted and where they are located for reference       Expected Outcomes  Short Term: Able to state/look up THRR;Short Term: Able to use daily as guideline for intensity in rehab;Long Term: Able to use THRR to govern intensity when exercising independently       Understanding of Exercise Prescription  Yes       Intervention  Provide education, explanation, and written materials on patient's individual exercise prescription       Expected Outcomes  Short Term: Able to explain program exercise prescription;Long Term: Able to explain home exercise prescription to exercise independently          Exercise Goals Re-Evaluation : Exercise Goals Re-Evaluation    Row Name 10/04/17 1440 11/02/17 0840 11/22/17 0735 12/03/17 1425 12/14/17 0805     Exercise Goal Re-Evaluation   Exercise Goals Review  Increase Physical Activity;Increase Strength and Stamina;Able to understand and use rate of perceived exertion (RPE) scale;Able to understand and use Dyspnea scale;Knowledge and understanding of Target Heart Rate Range  (THRR);Understanding of Exercise Prescription  Increase Strength and Stamina;Increase Physical Activity;Able to understand and use rate of perceived exertion (RPE) scale;Able to understand and use Dyspnea scale;Understanding of Exercise Prescription;Knowledge and understanding of Target Heart Rate Range (THRR)  Increase Strength and Stamina;Able to understand and use Dyspnea scale;Understanding of Exercise Prescription  Increase Strength and Stamina;Able to understand and use Dyspnea scale;Understanding of Exercise Prescription  Increase Strength and Stamina;Able to understand and use Dyspnea scale;Understanding of Exercise Prescription   Comments  Patient has not yet started PR. Patient will be progressed in time in accordance with her goals.   Patient is progessing at a steady rate. Patient is able to handle increases in weights and equipment workloads.   Patient has said that she has been able to return to the [facility] to attend several fitness classes. she still wants to be able to return to the pool to do water aerobics.   Patient has said that she has been able to return to the [facility] to attend several fitness classes. She still wants to be able to return to the pool to do water aerobics.   Patient has been out due to having extreme soreness in neck. Has had to see a chriopractor and seek the assistance of massage therapyin an effort to ease the pain and discomfort. She called and said her doctors says she can return with doing everything in a light manner.    Expected Outcomes  Patient wishes to have more energy and to breathe better and to get back to  singing and swimming again   To increase energy, breath better, get back to swimming and be able to sing on the choir again.   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   Get back to swimming and be able to sing on the choir again.   Get back to swimming and be able to sing on the choir again.    [place] Name  01/04/18 1005 02/02/18 1446 02/22/18 0825         Exercise Goal Re-Evaluation   Exercise Goals Review  Increase Strength and Stamina;Able to understand and use Dyspnea scale;Understanding of Exercise Prescription  Increase Strength and Stamina;Able to understand and use Dyspnea scale;Understanding of Exercise Prescription  Increase Strength and Stamina;Able to understand and use Dyspnea scale;Understanding of Exercise Prescription;Increase Physical Activity;Able to understand and use rate of perceived exertion (RPE) scale;Knowledge and understanding of Target Heart Rate Range (THRR);Able to check pulse independently     Comments  Patient has returned and is tolerating execise well. She works hard everyday to increase her distance on each machine and is impressed with her progress thus far.   Patient continues to do well in rehab since returning routinely. She has been experiencing some hip pain, but is still able to work on her machines. We will continue to progress as tolerated.   Patient has continued to work hard in rehab. She has been experiencing chronic hip pain so has bumped back some of her workloads and is tolerating it well.      Expected Outcomes  Increase energy levels and improve shortness of breath.   Improve SOB with activity.   Improve SOB with singing in the choir and doing yard work.         Discharge Exercise Prescription (Final Exercise Prescription Changes): Exercise Prescription Changes - 03/03/18 0700      Response to Exercise   Blood Pressure (Admit)  124/64    Blood Pressure (Exercise)  172/68    Blood Pressure (Exit)  120/68    Heart Rate (Admit)  66 bpm    Heart Rate (Exercise)  87 bpm    Heart Rate (Exit)  77 bpm    Oxygen Saturation (Admit)  95 %    Oxygen Saturation (Exercise)  96 %    Oxygen Saturation (Exit)  97 %    Rating of Perceived Exertion (Exercise)  11    Perceived Dyspnea (Exercise)  11    Comments  Only three more sessions!    Duration  Progress to  30 minutes of  aerobic without signs/symptoms of physical distress    Intensity  THRR unchanged      Progression   Progression  Continue to progress workloads to maintain intensity without signs/symptoms of physical distress.    Average METs  2.7      Resistance Training   Training Prescription  Yes    Weight  4    Reps  10-15    Time  5 Minutes      NuStep   Level  3    SPM  93    Minutes  17    METs  2.4      Arm Ergometer   Level  3    Watts  25    RPM  55    Minutes  22    METs  3      Home Exercise Plan   Plans to continue exercise at  Home (comment)    Frequency  Add 3 additional days to program exercise sessions.    Initial Home Exercises Provided  10/18/17       Nutrition:  Target Goals: Understanding of nutrition guidelines, daily intake of sodium <1580m, cholesterol <2069m calories 30% from fat and 7% or less from saturated fats, daily to have 5 or more servings of fruits and vegetables.  Biometrics: Pre Biometrics - 10/01/17 1010      Pre Biometrics   Height  '5\' 3"'  (1.6 m)    Weight  92 kg    Waist Circumference  48 inches    Hip Circumference  39.5 inches    Waist to Hip Ratio  1.22 %    BMI (Calculated)  35.93    Triceps Skinfold  5 mm    % Body Fat  40.2 %    Grip Strength  51.06 kg    Flexibility  0 in    Single Leg Stand  7 seconds      Post Biometrics - 03/10/18 1422       Post  Biometrics   Height  '5\' 3"'  (1.6 m)    Weight  92.7 kg    Waist Circumference  47 inches    Hip Circumference  40 inches    Waist to Hip Ratio  1.18 %    BMI (Calculated)  36.21    Triceps Skinfold  5 mm    % Body Fat  40 %    Grip Strength  55 kg    Flexibility  0 in    Single Leg Stand  0 seconds   8/10 coccyx pain       Nutrition Therapy Plan and Nutrition Goals: Nutrition Therapy & Goals - 10/07/17 1503      Personal Nutrition Goals   Nutrition Goal  For heart healthy choices add >50% of whole grains, make half their plate fruits and vegetables.  Discuss the difference between starchy vegetables and leafy greens, and how leafy vegetables provide fiber, helps maintain healthy weight, helps control blood glucose, and lowers cholesterol.  Discuss purchasing fresh or frozen vegetable to reduce sodium and not to add grease, fat or sugar. Consume <18oz of red meat per week. Consume lean cuts of meats and very little of meats high in sodium and nitrates such as pork and lunch meats. Discussed portion control for all food groups.        Intervention Plan   Intervention  Nutrition handout(s) given to patient.    Expected Outcomes  Short Term Goal: Understand basic principles of dietary content, such as calories, fat, sodium, cholesterol and nutrients.       Nutrition Assessments: Nutrition Assessments - 03/18/18 1344      MEDFICTS Scores   Pre Score  54    Post Score  75    Score Difference  21       Nutrition Goals Re-Evaluation: Nutrition Goals Re-Evaluation    Row Name 11/04/17 0752 11/24/17 1505 12/16/17 0740 01/06/18 0926 02/03/18 0803     Goals   Current Weight  202 lb (91.6 kg)  200 lb (90.7 kg)  200 lb (90.7 kg)  202 lb (91.6 kg)  206 lb (93.4 kg)   Nutrition Goal  For heart healthy choices add >50% of whole grains, make half their plate fruits and vegetables. Discuss the difference between starchy vegetables and leafy greens, and how leafy vegetables provide fiber, helps maintain healthy weight, helps control blood glucose, and lowers cholesterol.  Discuss purchasing fresh or frozen vegetable to reduce sodium and not to add grease, fat or sugar. Consume <18oz of red meat per week. Consume lean cuts of meats and very little of meats high in sodium and nitrates such as pork and lunch meats. Discussed portion control for all food groups.    For heart healthy choices add >50% of whole grains, make half their plate fruits and vegetables. Discuss the difference between starchy vegetables and leafy greens, and how leafy vegetables provide  fiber, helps maintain healthy weight, helps control blood glucose, and lowers cholesterol.  Discuss purchasing fresh or frozen vegetable to reduce sodium and not to add grease, fat or sugar. Consume <18oz of red meat per week. Consume lean cuts of meats and very little of meats high in sodium and nitrates such as pork and lunch meats. Discussed portion control for all food groups.    For heart healthy choices add >50% of whole grains, make half their plate fruits and vegetables. Discuss the difference between starchy vegetables and leafy greens, and how leafy vegetables provide fiber, helps maintain healthy weight, helps control blood glucose, and lowers cholesterol.  Discuss purchasing fresh or frozen vegetable to reduce sodium and not to add grease, fat or sugar. Consume <18oz of red meat per week. Consume lean cuts of meats and very little of meats high in sodium and nitrates such as pork and lunch meats. Discussed portion control for all food groups.    For heart healthy choices add >50% of whole grains, make half their plate fruits and vegetables. Discuss the difference between starchy vegetables and leafy greens, and how leafy vegetables provide fiber, helps maintain healthy weight, helps control blood glucose, and lowers cholesterol.  Discuss purchasing fresh or frozen vegetable to reduce sodium and not to add grease, fat or sugar. Consume <18oz of red meat per week. Consume lean cuts of meats and very little of meats high in sodium and nitrates such as pork and lunch meats. Discussed portion control for all food groups.    For heart healthy choices add >50% of whole grains, make half their plate fruits and vegetables. Discuss the difference between starchy vegetables and leafy greens, and how leafy vegetables provide fiber, helps maintain healthy weight, helps control blood glucose, and lowers cholesterol.  Discuss purchasing fresh or frozen vegetable to reduce sodium and not to add grease, fat or sugar.  Consume <18oz of red meat per week. Consume lean cuts of meats and very little of meats high in sodium and nitrates such as pork and lunch meats. Discussed portion control for all food groups.     Comment  Patient says she is eating heart healthy and is working toward meeting her nutritional goals.   Patient has lost 2 lbs since last 30 day review. She says she is continuing to eat healthy. Will continue to monitor.   Patient has maintained her weight since last 30 day review. She says she is continuing to eat healthy. Will continue to monitor.   Patient has gained 2 lbs since last 30 day review. She continues to say she is trying to eat healthy. Will continue to monitor.   Patient has gained 4 lbs since last 30 day review. She says she is trying to eat healthy but it has been hard recently with all the fall festivities she has been attending.    Expected Outcome  Patient will continue to work toward meeting her nutritional needes. Will continue to monitor for progress.  Patient will continue to work toward meeting her nutritional needes. Will continue to monitor for progress.   Patient will continue to work toward meeting her nutritional needes. Will continue to monitor for progress.   Patient will continue to work toward meeting her nutritional needes. Will continue to monitor for progress.   Patient will continue to work toward meeting her nutritional needes. Will continue to monitor for progress.      Personal Goal #2 Re-Evaluation   Personal Goal #2  Patient is working on eating heart healthy. Handout given for her to improve on her food choices.   Patient is working on eating heart healthy. Handout given for her to improve on her food choices.   Patient is working on eating heart healthy. Handout given for her to improve on her food choices.   -  -   Row Name 02/28/18 1409             Goals   Current Weight  207 lb (93.9 kg)       Nutrition Goal  For heart healthy choices add >50% of whole  grains, make half their plate fruits and vegetables. Discuss the difference between starchy vegetables and leafy greens, and how leafy vegetables provide fiber, helps maintain healthy weight, helps control blood glucose, and lowers cholesterol.  Discuss purchasing fresh or frozen vegetable to reduce sodium and not to add grease, fat or sugar. Consume <18oz of red meat per week. Consume lean cuts of meats and very little of meats high in sodium and nitrates such as pork and lunch meats. Discussed portion control for all food groups.         Comment  Patient has lost 1 lb since last 30 day review. She continues to say she is trying to eat healthy. Will continue to monitor.        Expected Outcome  Patient will continue to work toward meeting her nutritional needes. Will continue to monitor for progress.          Personal Goal #2 Re-Evaluation   Personal Goal #2  Patient is working on eating heart healthy. Handout given for her to improve on her food choices.           Nutrition Goals Discharge (Final Nutrition Goals Re-Evaluation): Nutrition Goals Re-Evaluation - 02/28/18 1409      Goals   Current Weight  207 lb (93.9 kg)    Nutrition Goal  For heart healthy choices add >50% of whole grains, make half their plate fruits and vegetables. Discuss the difference between starchy vegetables and leafy greens, and how leafy vegetables provide fiber, helps maintain healthy weight, helps control blood glucose, and lowers cholesterol.  Discuss purchasing fresh or frozen vegetable to reduce sodium and not to add grease, fat or sugar. Consume <18oz of red meat per week. Consume lean cuts of meats and very little of meats high in sodium and nitrates such as pork and lunch meats. Discussed portion control for all food groups.      Comment  Patient has lost 1 lb since last 30 day review. She continues to say she is trying to eat healthy. Will continue to monitor.     Expected Outcome  Patient will continue to work  toward meeting her nutritional needes. Will continue to monitor for progress.       Personal Goal #2 Re-Evaluation   Personal Goal #2  Patient is working on eating heart healthy. Handout given for her to improve on her food  choices.        Psychosocial: Target Goals: Acknowledge presence or absence of significant depression and/or stress, maximize coping skills, provide positive support system. Participant is able to verbalize types and ability to use techniques and skills needed for reducing stress and depression.  Initial Review & Psychosocial Screening: Initial Psych Review & Screening - 10/01/17 1134      Initial Review   Current issues with  None Identified      Family Dynamics   Good Support System?  Yes      Barriers   Psychosocial barriers to participate in program  There are no identifiable barriers or psychosocial needs.      Screening Interventions   Interventions  Encouraged to exercise    Expected Outcomes  Short Term goal: Identification and review with participant of any Quality of Life or Depression concerns found by scoring the questionnaire.;Long Term goal: The participant improves quality of Life and PHQ9 Scores as seen by post scores and/or verbalization of changes       Quality of Life Scores: Quality of Life - 03/01/18 1525      Quality of Life   Select  Quality of Life      Quality of Life Scores   Health/Function Pre  24.4 %    Health/Function Post  26 %    Health/Function % Change  6.56 %    Socioeconomic Pre  26.75 %    Socioeconomic Post  28 %    Socioeconomic % Change   4.67 %    Psych/Spiritual Pre  24.86 %    Psych/Spiritual Post  24.86 %    Psych/Spiritual % Change  0 %    Family Pre  18 %    Family Post  24 %    Family % Change  33.33 %    GLOBAL Pre  24.55 %    GLOBAL Post  25.94 %    GLOBAL % Change  5.66 %      Scores of 19 and below usually indicate a poorer quality of life in these areas.  A difference of  2-3 points is a  clinically meaningful difference.  A difference of 2-3 points in the total score of the Quality of Life Index has been associated with significant improvement in overall quality of life, self-image, physical symptoms, and general health in studies assessing change in quality of life.   PHQ-9: Recent Review Flowsheet Data    Depression screen [facility] 2/9 03/18/2018 10/01/2017   Decreased Interest 0 0   Down, Depressed, Hopeless 0 0   PHQ - 2 Score 0 0   Altered sleeping 1 2   Tired, decreased energy 2 2   Change in appetite 2 2   Feeling bad or failure about yourself  0 0   Trouble concentrating 2 0   Moving slowly or fidgety/restless 0 0   Suicidal thoughts 0 0   PHQ-9 Score 7 6   Difficult doing work/chores Not difficult at all Not difficult at all     Interpretation of Total Score  Total Score Depression Severity:  1-4 = Minimal depression, 5-9 = Mild depression, 10-14 = Moderate depression, 15-19 = Moderately severe depression, 20-27 = Severe depression   Psychosocial Evaluation and Intervention: Psychosocial Evaluation - 03/18/18 1348      Discharge Psychosocial Assessment & Intervention   Comments  Patient has no psychosocial issues identified at discharge. Her exit QOL score increased by 5.64% at 25.94%. Her PHQ-9 score did  not improve but went from 6 to 7.       Psychosocial Re-Evaluation: Psychosocial Re-Evaluation    Row Name 11/04/17 [id number] 11/24/17 1508 12/16/17 0744 01/06/18 0929 02/03/18 0807     Psychosocial Re-Evaluation   Current issues with  None Identified  None Identified  None Identified  None Identified  None Identified   Comments  Patient initial QOL score was 18 and her PHQ-9 score was 6 with no psychosocial issues identified at orientation. Will continue to monitor.  Patient initial QOL score was 18 and her PHQ-9 score was 6 with no psychosocial issues identified at orientation. Will continue to monitor.  Patient initial QOL score was 18 and her PHQ-9 score was 6  with no psychosocial issues identified. Will continue to monitor.  Patient initial QOL score was 18 and her PHQ-9 score was 6 with no psychosocial issues identified. Will continue to monitor.  Patient initial QOL score was 18 and her PHQ-9 score was 6 with no psychosocial issues identified. Will continue to monitor.   Expected Outcomes  Patient will have no psychosocial issues identified at discharge.   Patient will have no psychosocial issues identified at discharge.   Patient will have no psychosocial issues identified at discharge.   Patient will have no psychosocial issues identified at discharge.   Patient will have no psychosocial issues identified at discharge.    Interventions  Relaxation education;Encouraged to attend Pulmonary Rehabilitation for the exercise;Stress management education  Relaxation education;Encouraged to attend Pulmonary Rehabilitation for the exercise;Stress management education  Relaxation education;Encouraged to attend Pulmonary Rehabilitation for the exercise;Stress management education  Relaxation education;Encouraged to attend Pulmonary Rehabilitation for the exercise;Stress management education  Relaxation education;Encouraged to attend Pulmonary Rehabilitation for the exercise;Stress management education   Continue Psychosocial Services   No Follow up required  No Follow up required  No Follow up required  No Follow up required  No Follow up required   [place] Name 02/28/18 1412             Psychosocial Re-Evaluation   Current issues with  None Identified       Comments  Patient initial QOL score was 18 and her PHQ-9 score was 6 with no psychosocial issues identified. Will continue to monitor.       Expected Outcomes  Patient will have no psychosocial issues identified at discharge.        Interventions  Relaxation education;Encouraged to attend Pulmonary Rehabilitation for the exercise;Stress management education       Continue Psychosocial Services   No Follow up  required          Psychosocial Discharge (Final Psychosocial Re-Evaluation): Psychosocial Re-Evaluation - 02/28/18 1412      Psychosocial Re-Evaluation   Current issues with  None Identified    Comments  Patient initial QOL score was 18 and her PHQ-9 score was 6 with no psychosocial issues identified. Will continue to monitor.    Expected Outcomes  Patient will have no psychosocial issues identified at discharge.     Interventions  Relaxation education;Encouraged to attend Pulmonary Rehabilitation for the exercise;Stress management education    Continue Psychosocial Services   No Follow up required        Education: Education Goals: Education classes will be provided on a weekly basis, covering required topics. Participant will state understanding/return demonstration of topics presented.  Learning Barriers/Preferences: Learning Barriers/Preferences - 10/01/17 1020      Learning Barriers/Preferences   Learning Barriers  None    Learning  Preferences  Skilled Demonstration;Individual Instruction;Group Instruction       Education Topics: How Lungs Work and Diseases: - Discuss the anatomy of the lungs and diseases that can affect the lungs, such as COPD.   PULMONARY REHAB OTHER RESPIRATORY from 03/10/2018 in [place]  Date  11/04/17  Educator  DC  Instruction Review Code  2- Demonstrated Understanding      Exercise: -Discuss the importance of exercise, FITT principles of exercise, normal and abnormal responses to exercise, and how to exercise safely.   Environmental Irritants: -Discuss types of environmental irritants and how to limit exposure to environmental irritants.   PULMONARY REHAB OTHER RESPIRATORY from 03/10/2018 in [place]  Date  11/11/17  Educator  DWynetta Emery  Instruction Review Code  2- Demonstrated Understanding      Meds/Inhalers and oxygen: - Discuss respiratory medications, definition of an inhaler and  oxygen, and the proper way to use an inhaler and oxygen.   PULMONARY REHAB OTHER RESPIRATORY from 03/10/2018 in [place]  Date  11/18/17  Educator  D. Wynetta Emery      Energy Saving Techniques: - Discuss methods to conserve energy and decrease shortness of breath when performing activities of daily living.    PULMONARY REHAB OTHER RESPIRATORY from 03/10/2018 in [place]  Date  11/25/17  Educator  Etheleen Mayhew  Instruction Review Code  2- Demonstrated Understanding      Bronchial Hygiene / Breathing Techniques: - Discuss breathing mechanics, pursed-lip breathing technique,  proper posture, effective ways to clear airways, and other functional breathing techniques   Cleaning Equipment: - Provides group verbal and written instruction about the health risks of elevated stress, cause of high stress, and healthy ways to reduce stress.   PULMONARY REHAB OTHER RESPIRATORY from 03/10/2018 in [place]  Date  03/10/18  Educator  [place]  Instruction Review Code  2- Demonstrated Understanding      Nutrition I: Fats: - Discuss the types of cholesterol, what cholesterol does to the body, and how cholesterol levels can be controlled.   PULMONARY REHAB OTHER RESPIRATORY from 03/10/2018 in [place]  Date  12/16/17  Educator  Etheleen Mayhew  Instruction Review Code  2- Demonstrated Understanding      Nutrition II: Labels: -Discuss the different components of food labels and how to read food labels.   PULMONARY REHAB OTHER RESPIRATORY from 03/10/2018 in [place]  Date  12/23/17  Educator  DWynetta Emery  Instruction Review Code  2- Demonstrated Understanding      Respiratory Infections: - Discuss the signs and symptoms of respiratory infections, ways to prevent respiratory infections, and the importance of seeking medical treatment when having a respiratory infection.    PULMONARY REHAB OTHER RESPIRATORY from 03/10/2018 in [place]  Date  01/06/18  Educator  DWynetta Emery  Instruction Review Code  2- Demonstrated Understanding      Stress I: Signs and Symptoms: - Discuss the causes of stress, how stress may lead to anxiety and depression, and ways to limit stress.   PULMONARY REHAB OTHER RESPIRATORY from 03/10/2018 in [place]  Date  10/07/17  Educator  DC  Instruction Review Code  2- Demonstrated Understanding      Stress II: Relaxation: -Discuss relaxation techniques to limit stress.   PULMONARY REHAB OTHER RESPIRATORY from 03/10/2018 in [place]  Date  10/14/17  Educator  DC  Instruction  Review Code  2- Demonstrated Understanding      Oxygen for Home/Travel: - Discuss how to prepare for travel when on oxygen and proper ways to transport and store oxygen to ensure safety.   Knowledge Questionnaire Score: Knowledge Questionnaire Score - 03/18/18 1344      Knowledge Questionnaire Score   Pre Score  14/18    Post Score  15/15       Core Components/Risk Factors/Patient Goals at Admission: Personal Goals and Risk Factors at Admission - 10/01/17 1132      Core Components/Risk Factors/Patient Goals on Admission    Weight Management  Obesity    Personal Goal Other  Yes    Personal Goal  Have more energy, breath better, get back to swimming, sing on the choir again.     Intervention  Attend CR 3 x week and supplement with at home exercise 2 x week.     Expected Outcomes  Achieve personal goals.        Core Components/Risk Factors/Patient Goals Review:  Goals and Risk Factor Review    Row Name 11/04/17 0745 11/24/17 1506 12/16/17 0741 01/06/18 0927 02/03/18 0805     Core Components/Risk Factors/Patient Goals Review   Personal Goals Review  Weight Management/Obesity;Improve shortness of breath with ADL's;Develop more efficient breathing techniques such as purse  lipped breathing and diaphragmatic breathing and practicing self-pacing with activity. More energy; breathe better; get back to swimming; sing again.  Weight Management/Obesity;Improve shortness of breath with ADL's;Develop more efficient breathing techniques such as purse lipped breathing and diaphragmatic breathing and practicing self-pacing with activity. More energy; breathe better; get back to swimming; sing again.   Weight Management/Obesity;Improve shortness of breath with ADL's;Develop more efficient breathing techniques such as purse lipped breathing and diaphragmatic breathing and practicing self-pacing with activity. More energy; breathe better; get back to swimming; sing again.   Weight Management/Obesity;Improve shortness of breath with ADL's;Develop more efficient breathing techniques such as purse lipped breathing and diaphragmatic breathing and practicing self-pacing with activity. More energy; breathe better; get back to swimming; sing again.   Weight Management/Obesity;Improve shortness of breath with ADL's;Develop more efficient breathing techniques such as purse lipped breathing and diaphragmatic breathing and practicing self-pacing with activity. More energy; breathe better; get back to swimming; sing again.    Review  Patient has completed 8 sessions maintaining her weight. She is doing well in the program with some progression. She says she does feel her energy has increased. She is able to demonstrate proper pursed lip breathing techniques in the program during exercise. She does not see any improvement in her breathing yet but hopes to continue to improve as she continues the program. Will continue to monitor for progress.   Patient has completed 13 sessions losing 2 lbs since last 30 day review. She continues to do well in the program with progressions. She continues to say she feels stronger and feels she is less SOB with activities. She is now able to participate in 2 classes at the [facility]  and is planning to join a group of women that walk in her neighborhood. She is very pleased with her progress and hopes to see even more improvements as she continues. Will continue to monitor for progress.   Patient has completed 15 sessions maintaining her weight. She has only attended 2 sessions in this 30 day review due to pain in her back and shoulders. She was evaluated by her pcp who says she pulled a muscle. She continues to do  well in the program and will hopefully be able to be more consistent in her attendance. She hopes to get back to her classes at the [facility]. Will continue to monitor for progress.   Patient has completed 20 sessions gaining 2 lbs since last 30 day review. She continues to do well in the program with progression. Her back and shoulders have healed. She says she feels stronger and has more energy. She is also breathing better and building upper body strength. She is going to classes at the [facility] but has not started swimming yet. Will continue to monitor for progress.   Patient has completed 27 sessions gaining 4 lbs since last 30 day review. She continues to do well in the program with progression. She says she feels so much stronger and has more energy. She is able to do things now without "giving out" or having to stop and rest. She is less SOB and she continues her classes at the [facility]. She has not started back swimming and says she may not return to swimming due to ear problems. She is very pleased with her progress. Will continue to monitor.    Expected Outcomes  Patient will continue to attend sessions and complete the program meeting her personal goals.   Patient will continue to attend sessions and complete the program meeting her personal goals.   Patient will continue to attend sessions and complete the program meeting her personal goals.   Patient will continue to attend sessions and complete the program meeting her personal goals.   Patient will continue to attend sessions and  complete the program meeting her personal goals.    Fitzgerald Name 02/28/18 1410 03/18/18 1343           Core Components/Risk Factors/Patient Goals Review   Personal Goals Review  - More energy; breathe better; get back to swimming; sing again.  - More energy; improve breathing; get back to swimming and sing in choir.       Review  Patient has completed 32 sessions losing 1 lb since last 30 day review. She continues to do well in the program with progression. She continues to say she feel stronger and better overall and has more energy. She is able do more activities with her church and friends and is able to continue classes at the [facility]. She is very pleased with her progress in the program. Will continue to monitor.   Patient completed the program with 35 sessions gaining 1 lb overall. She did well in the program. Her exit measurements score improved in grip strength. Her exit walk test did not improve. Her exit pulmonary assessment score also were worse. She attributes this to being able to do more now longer making her rate her SOB higher. She has recently had chronic hip pain which limited her exit walk test. She says the program helped her alot. She has more strength and stamina now with increased energy. She says she is breathing better and her SOB has improved inspite of her test scores. She plans to continue exercising at the [facility] and by walking with some friends. PR will f/u for one year.       Expected Outcomes  Patient will continue to attend sessions and complete the program meeting her personal goals.   Patient will continue exercising at the [facility] and walking with friends and continue to meet her personal goals.          Core Components/Risk Factors/Patient Goals at Discharge (Final Review):  Goals and Risk Factor Review - 03/18/18 1343      Core Components/Risk Factors/Patient Goals Review   Personal Goals Review  --   More energy; improve breathing; get back to swimming and sing in choir.     Review  Patient completed the program with 35 sessions gaining 1 lb overall. She did well in the program. Her exit measurements score improved in grip strength. Her exit walk test did not improve. Her exit pulmonary assessment score also were worse. She attributes this to being able to do more now longer making her rate her SOB higher. She has recently had chronic hip pain which limited her exit walk test. She says the program helped her alot. She has more strength and stamina now with increased energy. She says she is breathing better and her SOB has improved inspite of her test scores. She plans to continue exercising at the [facility] and by walking with some friends. PR will f/u for one year.     Expected Outcomes  Patient will continue exercising at the [facility] and walking with friends and continue to meet her personal goals.        ITP Comments: ITP Comments    Row Name 10/06/17 1353 10/07/17 1504         ITP Comments  Patient new to program. She has completed 2 sessions. Will continue to monitor for progress.   Patient attended the [facility] class with hospital chaplian to discuss how this event has impacted their life.          Comments: Patient graduated from Pulmonary Rehabilitation today on 03/10/18 after completing 35 sessions. She achieved LTG of 30 minutes of aerobic exercise at Max Met level of 2.9. All patients vitals are WNL. Patient has met with dietician. Discharge instruction has been reviewed in detail and patient stated an understanding of material given. Patient plans to continue exercising at the [facility] and walking with friends. Pulmonary Rehab staff will make f/u calls at 1 month, 6 months, and 1 year. Patient had no complaints of any abnormal S/S or pain on their exit visit.

## 2018-03-18 NOTE — Progress Notes (Signed)
Discharge Progress Report  Patient Details  Name: [name] [name] MRN: [id number] Date of Birth: [birth year] Referring Provider:     PULMONARY REHAB OTHER RESPIRATORY from 10/01/2017 in [place]  Referring Provider  Dr. Annamaria Boots       Number of Visits: 35  Reason for Discharge:  Patient reached a stable level of exercise. Patient independent in their exercise. Patient has met program and personal goals.  Smoking History:  Social History   Tobacco Use  Smoking Status Never Smoker  Smokeless Tobacco Never Used    Diagnosis:  Asthma in adult, severe persistent, uncomplicated  ADL UCSD: Pulmonary Assessment Scores    Row Name 10/01/17 1124 03/18/18 1341       ADL UCSD   ADL Phase  Entry  Exit    SOB Score total  46  56    Rest  0  1    Walk  9  8    Stairs  4  4    Bath  2  2    Dress  2  2    Shop  2  3      CAT Score   CAT Score  19  16      mMRC Score   mMRC Score  3  3       Initial Exercise Prescription: Initial Exercise Prescription - 10/01/17 1000      Date of Initial Exercise RX and Referring Provider   Date  10/01/17    Referring Provider  Dr. Annamaria Boots      NuStep   Level  1    SPM  77    Minutes  15    METs  1.8      Arm Ergometer   Level  1.2    Watts  16    RPM  22    Minutes  20    METs  2.2      Prescription Details   Frequency (times per week)  2    Duration  Progress to 30 minutes of continuous aerobic without signs/symptoms of physical distress      Intensity   THRR 40-80% of Max Heartrate  [phone]    Ratings of Perceived Exertion  11-13    Perceived Dyspnea  0-4      Progression   Progression  Continue progressive overload as per policy without signs/symptoms or physical distress.      Resistance Training   Training Prescription  Yes    Weight  1    Reps  10-15       Discharge Exercise Prescription (Final Exercise Prescription Changes): Exercise Prescription Changes - 03/03/18 0700       Response to Exercise   Blood Pressure (Admit)  124/64    Blood Pressure (Exercise)  172/68    Blood Pressure (Exit)  120/68    Heart Rate (Admit)  66 bpm    Heart Rate (Exercise)  87 bpm    Heart Rate (Exit)  77 bpm    Oxygen Saturation (Admit)  95 %    Oxygen Saturation (Exercise)  96 %    Oxygen Saturation (Exit)  97 %    Rating of Perceived Exertion (Exercise)  11    Perceived Dyspnea (Exercise)  11    Comments  Only three more sessions!    Duration  Progress to 30 minutes of  aerobic without signs/symptoms of physical distress    Intensity  THRR unchanged  Progression   Progression  Continue to progress workloads to maintain intensity without signs/symptoms of physical distress.    Average METs  2.7      Resistance Training   Training Prescription  Yes    Weight  4    Reps  10-15    Time  5 Minutes      NuStep   Level  3    SPM  93    Minutes  17    METs  2.4      Arm Ergometer   Level  3    Watts  25    RPM  55    Minutes  22    METs  3      Home Exercise Plan   Plans to continue exercise at  Home (comment)    Frequency  Add 3 additional days to program exercise sessions.    Initial Home Exercises Provided  10/18/17       Functional Capacity: 6 Minute Walk    Row Name 10/01/17 1006 03/10/18 1419       6 Minute Walk   Phase  Initial  Discharge    Distance  900 feet  850 feet    Distance % Change  0 %  -5 %    Distance Feet Change  0 ft  50 ft    Walk Time  6 minutes  6 minutes    # of Rest Breaks  0  0    MPH  1.7  1.61    METS  2.3  1.77    RPE  13  11    Perceived Dyspnea   14  13    VO2 Peak  6.24  5.3    Symptoms  Yes (comment)  Yes (comment)    Comments  Patient had to push a wheelchair half way through walk test due to extreme shortness of breath  8/10 coccyx pain, leg tiredness, 7/10 R knee pain - subsided with rest     Resting HR  62 bpm  66 bpm    Resting BP  124/52  142/62    Resting Oxygen Saturation   97 %  97 %    Exercise  Oxygen Saturation  during 6 min walk  98 %  98 %    Max Ex. HR  97 bpm  101 bpm    Max Ex. BP  200/62  170/58    2 Minute Post BP  154/60  140/66       Psychological, QOL, Others - Outcomes: PHQ 2/9: Depression screen [facility] 2/9 03/18/2018 10/01/2017  Decreased Interest 0 0  Down, Depressed, Hopeless 0 0  PHQ - 2 Score 0 0  Altered sleeping 1 2  Tired, decreased energy 2 2  Change in appetite 2 2  Feeling bad or failure about yourself  0 0  Trouble concentrating 2 0  Moving slowly or fidgety/restless 0 0  Suicidal thoughts 0 0  PHQ-9 Score 7 6  Difficult doing work/chores Not difficult at all Not difficult at all  Some recent data might be hidden    Quality of Life: Quality of Life - 03/01/18 1525      Quality of Life   Select  Quality of Life      Quality of Life Scores   Health/Function Pre  24.4 %    Health/Function Post  26 %    Health/Function % Change  6.56 %    Socioeconomic Pre  26.75 %  Socioeconomic Post  28 %    Socioeconomic % Change   4.67 %    Psych/Spiritual Pre  24.86 %    Psych/Spiritual Post  24.86 %    Psych/Spiritual % Change  0 %    Family Pre  18 %    Family Post  24 %    Family % Change  33.33 %    GLOBAL Pre  24.55 %    GLOBAL Post  25.94 %    GLOBAL % Change  5.66 %       Personal Goals: Goals established at orientation with interventions provided to work toward goal. Personal Goals and Risk Factors at Admission - 10/01/17 1132      Core Components/Risk Factors/Patient Goals on Admission    Weight Management  Obesity    Personal Goal Other  Yes    Personal Goal  Have more energy, breath better, get back to swimming, sing on the choir again.     Intervention  Attend CR 3 x week and supplement with at home exercise 2 x week.     Expected Outcomes  Achieve personal goals.         Personal Goals Discharge: Goals and Risk Factor Review    Row Name 11/04/17 0745 11/24/17 1506 12/16/17 0741 01/06/18 0927 02/03/18 0805     Core  Components/Risk Factors/Patient Goals Review   Personal Goals Review  Weight Management/Obesity;Improve shortness of breath with ADL's;Develop more efficient breathing techniques such as purse lipped breathing and diaphragmatic breathing and practicing self-pacing with activity. More energy; breathe better; get back to swimming; sing again.  Weight Management/Obesity;Improve shortness of breath with ADL's;Develop more efficient breathing techniques such as purse lipped breathing and diaphragmatic breathing and practicing self-pacing with activity. More energy; breathe better; get back to swimming; sing again.   Weight Management/Obesity;Improve shortness of breath with ADL's;Develop more efficient breathing techniques such as purse lipped breathing and diaphragmatic breathing and practicing self-pacing with activity. More energy; breathe better; get back to swimming; sing again.   Weight Management/Obesity;Improve shortness of breath with ADL's;Develop more efficient breathing techniques such as purse lipped breathing and diaphragmatic breathing and practicing self-pacing with activity. More energy; breathe better; get back to swimming; sing again.   Weight Management/Obesity;Improve shortness of breath with ADL's;Develop more efficient breathing techniques such as purse lipped breathing and diaphragmatic breathing and practicing self-pacing with activity. More energy; breathe better; get back to swimming; sing again.    Review  Patient has completed 8 sessions maintaining her weight. She is doing well in the program with some progression. She says she does feel her energy has increased. She is able to demonstrate proper pursed lip breathing techniques in the program during exercise. She does not see any improvement in her breathing yet but hopes to continue to improve as she continues the program. Will continue to monitor for progress.   Patient has completed 13 sessions losing 2 lbs since last 30 day review. She  continues to do well in the program with progressions. She continues to say she feels stronger and feels she is less SOB with activities. She is now able to participate in 2 classes at the [facility] and is planning to join a group of women that walk in her neighborhood. She is very pleased with her progress and hopes to see even more improvements as she continues. Will continue to monitor for progress.   Patient has completed 15 sessions maintaining her weight. She has only attended 2 sessions in  this 30 day review due to pain in her back and shoulders. She was evaluated by her pcp who says she pulled a muscle. She continues to do well in the program and will hopefully be able to be more consistent in her attendance. She hopes to get back to her classes at the [facility]. Will continue to monitor for progress.   Patient has completed 20 sessions gaining 2 lbs since last 30 day review. She continues to do well in the program with progression. Her back and shoulders have healed. She says she feels stronger and has more energy. She is also breathing better and building upper body strength. She is going to classes at the [facility] but has not started swimming yet. Will continue to monitor for progress.   Patient has completed 27 sessions gaining 4 lbs since last 30 day review. She continues to do well in the program with progression. She says she feels so much stronger and has more energy. She is able to do things now without "giving out" or having to stop and rest. She is less SOB and she continues her classes at the [facility]. She has not started back swimming and says she may not return to swimming due to ear problems. She is very pleased with her progress. Will continue to monitor.    Expected Outcomes  Patient will continue to attend sessions and complete the program meeting her personal goals.   Patient will continue to attend sessions and complete the program meeting her personal goals.   Patient will continue to attend sessions and  complete the program meeting her personal goals.   Patient will continue to attend sessions and complete the program meeting her personal goals.   Patient will continue to attend sessions and complete the program meeting her personal goals.    Bendena Name 02/28/18 1410 03/18/18 1343           Core Components/Risk Factors/Patient Goals Review   Personal Goals Review  - More energy; breathe better; get back to swimming; sing again.  - More energy; improve breathing; get back to swimming and sing in choir.       Review  Patient has completed 32 sessions losing 1 lb since last 30 day review. She continues to do well in the program with progression. She continues to say she feel stronger and better overall and has more energy. She is able do more activities with her church and friends and is able to continue classes at the [facility]. She is very pleased with her progress in the program. Will continue to monitor.   Patient completed the program with 35 sessions gaining 1 lb overall. She did well in the program. Her exit measurements score improved in grip strength. Her exit walk test did not improve. Her exit pulmonary assessment score also were worse. She attributes this to being able to do more now longer making her rate her SOB higher. She has recently had chronic hip pain which limited her exit walk test. She says the program helped her alot. She has more strength and stamina now with increased energy. She says she is breathing better and her SOB has improved inspite of her test scores. She plans to continue exercising at the [facility] and by walking with some friends. PR will f/u for one year.       Expected Outcomes  Patient will continue to attend sessions and complete the program meeting her personal goals.   Patient will continue exercising at the [facility]  and walking with friends and continue to meet her personal goals.          Exercise Goals and Review: Exercise Goals    Row Name 10/01/17 1009              Exercise Goals   Increase Physical Activity  Yes       Intervention  Develop an individualized exercise prescription for aerobic and resistive training based on initial evaluation findings, risk stratification, comorbidities and participant's personal goals.;Provide advice, education, support and counseling about physical activity/exercise needs.       Expected Outcomes  Short Term: Attend rehab on a regular basis to increase amount of physical activity.       Increase Strength and Stamina  Yes       Intervention  Provide advice, education, support and counseling about physical activity/exercise needs.;Develop an individualized exercise prescription for aerobic and resistive training based on initial evaluation findings, risk stratification, comorbidities and participant's personal goals.       Expected Outcomes  Short Term: Increase workloads from initial exercise prescription for resistance, speed, and METs.       Able to understand and use rate of perceived exertion (RPE) scale  Yes       Intervention  Provide education and explanation on how to use RPE scale       Expected Outcomes  Short Term: Able to use RPE daily in rehab to express subjective intensity level;Long Term:  Able to use RPE to guide intensity level when exercising independently       Able to understand and use Dyspnea scale  Yes       Intervention  Provide education and explanation on how to use Dyspnea scale       Expected Outcomes  Short Term: Able to use Dyspnea scale daily in rehab to express subjective sense of shortness of breath during exertion;Long Term: Able to use Dyspnea scale to guide intensity level when exercising independently       Knowledge and understanding of Target Heart Rate Range (THRR)  Yes       Intervention  Provide education and explanation of THRR including how the numbers were predicted and where they are located for reference       Expected Outcomes  Short Term: Able to state/look up THRR;Short Term:  Able to use daily as guideline for intensity in rehab;Long Term: Able to use THRR to govern intensity when exercising independently       Understanding of Exercise Prescription  Yes       Intervention  Provide education, explanation, and written materials on patient's individual exercise prescription       Expected Outcomes  Short Term: Able to explain program exercise prescription;Long Term: Able to explain home exercise prescription to exercise independently          Exercise Goals Re-Evaluation: Exercise Goals Re-Evaluation    Row Name 10/04/17 1440 11/02/17 0840 11/22/17 0735 12/03/17 1425 12/14/17 0805     Exercise Goal Re-Evaluation   Exercise Goals Review  Increase Physical Activity;Increase Strength and Stamina;Able to understand and use rate of perceived exertion (RPE) scale;Able to understand and use Dyspnea scale;Knowledge and understanding of Target Heart Rate Range (THRR);Understanding of Exercise Prescription  Increase Strength and Stamina;Increase Physical Activity;Able to understand and use rate of perceived exertion (RPE) scale;Able to understand and use Dyspnea scale;Understanding of Exercise Prescription;Knowledge and understanding of Target Heart Rate Range (THRR)  Increase Strength and Stamina;Able to understand and use Dyspnea scale;Understanding  of Exercise Prescription  Increase Strength and Stamina;Able to understand and use Dyspnea scale;Understanding of Exercise Prescription  Increase Strength and Stamina;Able to understand and use Dyspnea scale;Understanding of Exercise Prescription   Comments  Patient has not yet started PR. Patient will be progressed in time in accordance with her goals.   Patient is progessing at a steady rate. Patient is able to handle increases in weights and equipment workloads.   Patient has said that she has been able to return to the [facility] to attend several fitness classes. she still wants to be able to return to the pool to do water aerobics.    Patient has said that she has been able to return to the [facility] to attend several fitness classes. She still wants to be able to return to the pool to do water aerobics.   Patient has been out due to having extreme soreness in neck. Has had to see a chriopractor and seek the assistance of massage therapyin an effort to ease the pain and discomfort. She called and said her doctors says she can return with doing everything in a light manner.    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   Get back to swimming and be able to sing on the choir again.   Get back to swimming and be able to sing on the choir again.    Piatt Name 01/04/18 1005 02/02/18 1446 02/22/18 0825         Exercise Goal Re-Evaluation   Exercise Goals Review  Increase Strength and Stamina;Able to understand and use Dyspnea scale;Understanding of Exercise Prescription  Increase Strength and Stamina;Able to understand and use Dyspnea scale;Understanding of Exercise Prescription  Increase Strength and Stamina;Able to understand and use Dyspnea scale;Understanding of Exercise Prescription;Increase Physical Activity;Able to understand and use rate of perceived exertion (RPE) scale;Knowledge and understanding of Target Heart Rate Range (THRR);Able to check pulse independently     Comments  Patient has returned and is tolerating execise well. She works hard everyday to increase her distance on each machine and is impressed with her progress thus far.   Patient continues to do well in rehab since returning routinely. She has been experiencing some hip pain, but is still able to work on her machines. We will continue to progress as tolerated.   Patient has continued to work hard in  rehab. She has been experiencing chronic hip pain so has bumped back some of her workloads and is tolerating it well.      Expected Outcomes  Increase energy levels and improve shortness of breath.   Improve SOB with activity.   Improve SOB with singing in the choir and doing yard work.         Nutrition & Weight - Outcomes: Pre Biometrics - 10/01/17 1010      Pre Biometrics   Height  5' 3" (1.6 m)    Weight  92 kg    Waist Circumference  48 inches    Hip Circumference  39.5 inches    Waist to Hip Ratio  1.22 %    BMI (Calculated)  35.93    Triceps Skinfold  5 mm    %  Body Fat  40.2 %    Grip Strength  51.06 kg    Flexibility  0 in    Single Leg Stand  7 seconds      Post Biometrics - 03/10/18 1422       Post  Biometrics   Height  5' 3" (1.6 m)    Weight  92.7 kg    Waist Circumference  47 inches    Hip Circumference  40 inches    Waist to Hip Ratio  1.18 %    BMI (Calculated)  36.21    Triceps Skinfold  5 mm    % Body Fat  40 %    Grip Strength  55 kg    Flexibility  0 in    Single Leg Stand  0 seconds   8/10 coccyx pain       Nutrition: Nutrition Therapy & Goals - 10/07/17 1503      Personal Nutrition Goals   Nutrition Goal  For heart healthy choices add >50% of whole grains, make half their plate fruits and vegetables. Discuss the difference between starchy vegetables and leafy greens, and how leafy vegetables provide fiber, helps maintain healthy weight, helps control blood glucose, and lowers cholesterol.  Discuss purchasing fresh or frozen vegetable to reduce sodium and not to add grease, fat or sugar. Consume <18oz of red meat per week. Consume lean cuts of meats and very little of meats high in sodium and nitrates such as pork and lunch meats. Discussed portion control for all food groups.        Intervention Plan   Intervention  Nutrition handout(s) given to patient.    Expected Outcomes  Short Term Goal: Understand basic principles of dietary content, such as  calories, fat, sodium, cholesterol and nutrients.       Nutrition Discharge: Nutrition Assessments - 03/18/18 1344      MEDFICTS Scores   Pre Score  54    Post Score  75    Score Difference  21       Education Questionnaire Score: Knowledge Questionnaire Score - 03/18/18 1344      Knowledge Questionnaire Score   Pre Score  14/18    Post Score  15/15       Goals reviewed with patient; copy given to patient.

## 2018-04-04 ENCOUNTER — Other Ambulatory Visit (HOSPITAL_COMMUNITY): Payer: Self-pay | Admitting: *Deleted

## 2018-04-04 DIAGNOSIS — M2062 Acquired deformities of toe(s), unspecified, left foot: Secondary | ICD-10-CM | POA: Diagnosis not present

## 2018-04-04 DIAGNOSIS — M2012 Hallux valgus (acquired), left foot: Secondary | ICD-10-CM | POA: Diagnosis not present

## 2018-04-04 DIAGNOSIS — I10 Essential (primary) hypertension: Secondary | ICD-10-CM | POA: Diagnosis not present

## 2018-04-04 DIAGNOSIS — I4891 Unspecified atrial fibrillation: Secondary | ICD-10-CM | POA: Diagnosis not present

## 2018-04-04 DIAGNOSIS — R110 Nausea: Secondary | ICD-10-CM | POA: Diagnosis not present

## 2018-04-04 DIAGNOSIS — Z01818 Encounter for other preprocedural examination: Secondary | ICD-10-CM | POA: Diagnosis not present

## 2018-04-04 DIAGNOSIS — Z299 Encounter for prophylactic measures, unspecified: Secondary | ICD-10-CM | POA: Diagnosis not present

## 2018-04-04 DIAGNOSIS — Z6836 Body mass index (BMI) 36.0-36.9, adult: Secondary | ICD-10-CM | POA: Diagnosis not present

## 2018-04-04 DIAGNOSIS — M2042 Other hammer toe(s) (acquired), left foot: Secondary | ICD-10-CM | POA: Diagnosis not present

## 2018-04-04 DIAGNOSIS — Z789 Other specified health status: Secondary | ICD-10-CM | POA: Diagnosis not present

## 2018-04-04 MED ORDER — DILTIAZEM HCL ER COATED BEADS 120 MG PO CP24: 120.0000 mg | ORAL_CAPSULE | Freq: Every day | ORAL | 2 refills | Status: DC

## 2018-04-12 ENCOUNTER — Encounter: Payer: Self-pay | Admitting: Internal Medicine

## 2018-04-12 ENCOUNTER — Ambulatory Visit (INDEPENDENT_AMBULATORY_CARE_PROVIDER_SITE_OTHER): Payer: Medicare Other | Admitting: Internal Medicine

## 2018-04-12 VITALS — BP 118/64 | HR 57 | Ht 63.0 in | Wt 195.8 lb

## 2018-04-12 DIAGNOSIS — G4733 Obstructive sleep apnea (adult) (pediatric): Secondary | ICD-10-CM | POA: Diagnosis not present

## 2018-04-12 DIAGNOSIS — J45901 Unspecified asthma with (acute) exacerbation: Secondary | ICD-10-CM | POA: Diagnosis not present

## 2018-04-12 DIAGNOSIS — J4521 Mild intermittent asthma with (acute) exacerbation: Secondary | ICD-10-CM

## 2018-04-12 MED ORDER — LEVALBUTEROL HCL 0.63 MG/3ML IN NEBU
0.6300 mg | INHALATION_SOLUTION | Freq: Once | RESPIRATORY_TRACT | Status: AC
  Administered 2018-04-12: 0.63 mg via RESPIRATORY_TRACT

## 2018-04-12 MED ORDER — ALBUTEROL SULFATE HFA 108 (90 BASE) MCG/ACT IN AERS: INHALATION_SPRAY | RESPIRATORY_TRACT | 12 refills | Status: DC

## 2018-04-12 MED ORDER — METHYLPREDNISOLONE ACETATE 80 MG/ML IJ SUSP
80.0000 mg | Freq: Once | INTRAMUSCULAR | Status: AC
  Administered 2018-04-12: 80 mg via INTRAMUSCULAR

## 2018-04-12 NOTE — Progress Notes (Signed)
HPI female never smoker followed for OSA, allergic rhinitis, asthma, complicated by A. Fib/Coumadin, HBP, GERD, obesity NPSG prior to EMR in 2012 Office Spirometry 09/02/16-WNL. FVC 2.06/76%, FEV1 1.57/77%, ratio 0.76, FEF 25-75% 1.26/80% Allergy labs 09/02/16- eosinophils 200, total IgE 4230 causing elevation of all specific allergen antibody levels including Aspergillus. Nucala  + 2018 PFT-10/13/16-minimal obstruction without response to dilator, minimal reduction of diffusion. FVC 2.02/77%, FEV1 1.62/83%, ratio 0.80, TLC 92%, DLCO 77% CT soft tissue neck 09/22/16-1. 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 Subsegmental atelectasis and nonspecific linear patchy densities in the lungs as described. They have a benign appearance.  Small hyperdense masses in the mediastinum are stable compared with 2015 supporting benign etiology such as ectopic thyroid tissue. Three-vessel prominent coronary artery calcification. Bibasilar bronchiolectasis. Labs 09/20/2017-WBC 14,900, hemoglobin 11.2, eosinophils absent, lymphocytes low( ?steroids or viral?), BNP 220, CMET wnl ------------------------------------------------------------------------------------------- 03/03/2018-  78 year old female never smoker followed for OSA, allergic rhinitis, asthma/ Nucala, complicated by A. Fib/Coumadin, HBP, GERD, obesity CPAP 9/Advanced -----OSA: DME Pt wears CPAP nightly and DL attached. Pt notes increased SOB and leg edema-had to stop Pulmonary Rehab due to "pulled muscle" and SOB.  Persistent atrial fibrillation managed now with Tikosyn and followed by cardiology. Download 100% compliance AHI 2.6/hour She has gained about 3 pounds recently and noticing a little more tightness in her ankles.  Pending cardiology follow-up. Increased dry cough so she has started using nebulized Pulmicort more often-discussed. Like to pulmonary rehab but is on temporary hiatus  after hurting shoulder and hip. Waking in the morning with nasal congestion since indoor heat came on.  Blames some occasional queasiness since Thanksgiving on postnasal drip but without headache, purulent discharge or obvious infection.  04/12/2018- 78 year old female never smoker followed for OSA, allergic rhinitis, asthma/ Nucala, complicated by A. Fib/Coumadin, HBP, GERD, obesity CPAP 9/Advanced -----Asthma: Pt here due to asthma exacerbation per surgeon; had to post pone surgery x 6 weeks. Pt is on Nucala every 28 days as well. Pt notes ? allergy issues-sneezing and itchy watery eyes.  Neb Pulmicort, neb DuoNeb, Singulair, Anoro,  Since Thanksgiving she has had nausea blamed on postnasal drip and a minimal sense of nasal stuffiness and felt at the bridge of the nose.  Cough and nasal discharge productive of white.  Sneezing more in the last 2 months.  Podiatric surgery was deferred by anesthesia because she was wheezing on exam. She volunteers she thinks her wheezing is related to reflux.  Pending follow-up appointment soon with Dr.Ramon/ GI. She feels she cannot sleep without CPAP and is comfortable with pressure 9 but does notice some nasal congestion. Today as I talked with her, she was initially clear.  Then she leaned forward, began paroxysmal dry cough, followed by diffuse inspiratory and expiratory wheezing which was self-limited over about a minute. She states that sustained coughing can be a trigger for her A. Fib. Office Spirometry 04/12/2018-mild restriction of exhaled volume.  Possible mild obstructive airways.  FVC 1.9/73%, FEV1 1.4/75%, ratio 1.76, FEF 25-75% 1.2/80%  ROS-see HPI   + = positive Constitutional:   No-   weight loss, night sweats, fevers, chills, fatigue, lassitude. HEENT:   No-  headaches, difficulty swallowing, tooth/dental problems, sore throat,       No-  sneezing, itching,  +ear ache,  +nasal congestion, +post nasal drip,  CV:  No-   chest pain, orthopnea, PND,  swelling in lower extremities, anasarca, dizziness, palpitations Resp: +  shortness of breath  with exertion or at rest.                productive cough,  + non-productive cough,  No- coughing up of blood.              No-change in color of mucus. + wheezing.   Skin: No-   rash or lesions. GI:  No-   heartburn, indigestion, abdominal pain, nausea, vomiting, GU:  MS:  No-   joint pain or swelling. . Neuro-     nothing unusual Psych:  No- change in mood or affect. No depression or anxiety.  No memory loss.  OBJ- Physical Exam General- Alert, Oriented, Affect-appropriate, Distress- none acute, + obese Skin- rash-none, lesions- none, excoriation- none Lymphadenopathy- none Head- atraumatic            Eyes- Gross vision intact, PERRLA, conjunctivae and secretions clear            Ears- Hearing, canals-normal            Nose- +turbinate edema, no-Septal dev, mucus, polyps, erosion, perforation             Throat- Mallampati III-IV , mucosa+ thrush,  drainage- none, tonsils- atrophic Neck- flexible , trachea midline, no stridor , thyroid nl, carotid no bruit Chest - symmetrical excursion , unlabored           Heart/CV- RR/ very faint (no pacemaker) , no murmur , no gallop  , no rub, nl  s1 s2                  - JVD- none , edema+trace, stasis changes- none, varices- none           Lung-  Wheeze+ , cough+,                         dullness-none, rub- none           Chest wall-  Abd- Br/ Gen/ Rectal- Not done, not indicated Extrem- cyanosis- none, clubbing, none, atrophy- none, strength- nl Neuro- grossly intact to observation

## 2018-04-12 NOTE — Patient Instructions (Signed)
  Order- office spirometry, IgE   Order- neb xop 0.63    Dx exacerbation Asthma              Depo 80   Script sent for albuterol rescue inhaler to use if needed for wheezing and shortness of breath

## 2018-04-12 NOTE — Assessment & Plan Note (Signed)
She continues to benefit from CPAP feeling that she cannot sleep without it and is comfortable with pressure 9. Plan-continue CPAP 9

## 2018-04-12 NOTE — Assessment & Plan Note (Signed)
Persistent asthma.  Exacerbation witnessed at this visit would be consistent with an aspiration triggered event.  She coughed and then began wheezing while sitting in the exam room and resolved over about 1 minute, spontaneously. Plan-pro-air rescue inhaler to have available, lab for CBC with differential to check eosinophils, office spirometry, Xopenex nebulizer treatment, Depo-Medrol

## 2018-04-13 LAB — IGE: IgE (Immunoglobulin E), Serum: 2574 kU/L — ABNORMAL HIGH (ref <=114)

## 2018-04-18 ENCOUNTER — Ambulatory Visit (INDEPENDENT_AMBULATORY_CARE_PROVIDER_SITE_OTHER): Payer: Medicare Other

## 2018-04-18 DIAGNOSIS — J4550 Severe persistent asthma, uncomplicated: Secondary | ICD-10-CM | POA: Diagnosis not present

## 2018-04-20 MED ORDER — MEPOLIZUMAB 100 MG ~~LOC~~ SOLR
100.0000 mg | Freq: Once | SUBCUTANEOUS | Status: AC
  Administered 2018-04-20: 100 mg via SUBCUTANEOUS

## 2018-04-22 DIAGNOSIS — M545 Low back pain: Secondary | ICD-10-CM | POA: Diagnosis not present

## 2018-04-22 DIAGNOSIS — M9901 Segmental and somatic dysfunction of cervical region: Secondary | ICD-10-CM | POA: Diagnosis not present

## 2018-04-22 DIAGNOSIS — M542 Cervicalgia: Secondary | ICD-10-CM | POA: Diagnosis not present

## 2018-04-22 DIAGNOSIS — M9902 Segmental and somatic dysfunction of thoracic region: Secondary | ICD-10-CM | POA: Diagnosis not present

## 2018-04-22 DIAGNOSIS — M9903 Segmental and somatic dysfunction of lumbar region: Secondary | ICD-10-CM | POA: Diagnosis not present

## 2018-04-22 DIAGNOSIS — M9905 Segmental and somatic dysfunction of pelvic region: Secondary | ICD-10-CM | POA: Diagnosis not present

## 2018-04-22 DIAGNOSIS — M546 Pain in thoracic spine: Secondary | ICD-10-CM | POA: Diagnosis not present

## 2018-04-25 DIAGNOSIS — M9901 Segmental and somatic dysfunction of cervical region: Secondary | ICD-10-CM | POA: Diagnosis not present

## 2018-04-25 DIAGNOSIS — M8580 Other specified disorders of bone density and structure, unspecified site: Secondary | ICD-10-CM | POA: Diagnosis not present

## 2018-04-25 DIAGNOSIS — M546 Pain in thoracic spine: Secondary | ICD-10-CM | POA: Diagnosis not present

## 2018-04-25 DIAGNOSIS — M542 Cervicalgia: Secondary | ICD-10-CM | POA: Diagnosis not present

## 2018-04-25 DIAGNOSIS — M545 Low back pain: Secondary | ICD-10-CM | POA: Diagnosis not present

## 2018-04-25 DIAGNOSIS — M1712 Unilateral primary osteoarthritis, left knee: Secondary | ICD-10-CM | POA: Diagnosis not present

## 2018-04-25 DIAGNOSIS — M9902 Segmental and somatic dysfunction of thoracic region: Secondary | ICD-10-CM | POA: Diagnosis not present

## 2018-04-25 DIAGNOSIS — M9905 Segmental and somatic dysfunction of pelvic region: Secondary | ICD-10-CM | POA: Diagnosis not present

## 2018-04-25 DIAGNOSIS — M9903 Segmental and somatic dysfunction of lumbar region: Secondary | ICD-10-CM | POA: Diagnosis not present

## 2018-04-25 DIAGNOSIS — M25562 Pain in left knee: Secondary | ICD-10-CM | POA: Diagnosis not present

## 2018-04-26 ENCOUNTER — Ambulatory Visit (INDEPENDENT_AMBULATORY_CARE_PROVIDER_SITE_OTHER): Payer: Medicare Other | Admitting: Internal Medicine

## 2018-04-26 ENCOUNTER — Encounter (INDEPENDENT_AMBULATORY_CARE_PROVIDER_SITE_OTHER): Payer: Self-pay | Admitting: Internal Medicine

## 2018-04-26 VITALS — BP 158/74 | HR 72 | Temp 97.7°F | Resp 18 | Ht 63.0 in | Wt 194.2 lb

## 2018-04-26 DIAGNOSIS — K219 Gastro-esophageal reflux disease without esophagitis: Secondary | ICD-10-CM | POA: Diagnosis not present

## 2018-04-26 NOTE — Progress Notes (Signed)
Presenting complaint;  Follow-up for chronic GERD.  Database and subjective:  Patient is 78 year old Caucasian female who has history of chronic GERD who was last seen in January 2019.  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  She says she was doing well until the day of Thanksgiving when she woke up with nausea and nausea persisted until 1 week ago.  She also noted heartburn.  Along with the symptoms she also was experiencing sinus drainage.  She says she has been using saline nasal lavage as well as Flonase.  She says since her nasal symptoms have improved her heartburn has resolved and she is not nauseated anymore.  While she was having these symptoms Dr. Manuella Ghazi asked her to take famotidine in the evening.  Now she is having heartburn no more than once a week.  It is transient.  She has changed her eating habits.  She has lost 13 pounds since her last visit.  She feels most of the weight loss has occurred in the last few months.  She denies nocturnal regurgitation or dysphagia.  She takes a stool softener and Benefiber and her bowels move every day.  She is having pain in her left hip knee as well as foot.  She is scheduled to undergo left foot surgery on 05/18/2018.   Current Medications: Outpatient Encounter Medications as of 04/26/2018  Medication Sig  . acetaminophen (TYLENOL) 500 MG tablet Take 500 mg by mouth daily as needed for headache.  . albuterol (PROAIR HFA) 108 (90 Base) MCG/ACT inhaler 2 puffs every 6 hours for wheezing as needed  . antiseptic oral rinse (BIOTENE) LIQD 15 mLs by Mouth Rinse route as needed for dry mouth.  Marland Kitchen atorvastatin (LIPITOR) 10 MG tablet Take 10 mg by mouth every evening.   . baclofen (LIORESAL) 10 MG tablet Take 10 mg by mouth 2 (two) times daily as needed for muscle spasms.   . benzonatate (TESSALON) 200 MG capsule Take 1 capsule (200 mg total) by mouth 3 (three)  times daily as needed for cough.  . Biotin 1 MG CAPS Take 1,000 mg by mouth daily.  . budesonide (PULMICORT) 0.5 MG/2ML nebulizer solution Take 0.5 mg by nebulization 2 (two) times daily.   . Calcium Carbonate-Vit D-Min (QC CALCIUM-MAGNESIUM-ZINC-D3) 333.4-133 MG-UNIT TABS Take 1 tablet by mouth 2 (two) times daily.   . cetirizine (ZYRTEC) 10 MG tablet Take 10 mg by mouth daily as needed for allergies.  . clindamycin (CLEOCIN) 300 MG capsule 2 capsules as needed. Take prior to dental procedures  . Dextromethorphan-guaiFENesin (MUCINEX DM) 30-600 MG TB12 Take 1-2 tablets by mouth every 12 (twelve) hours as needed.  . diltiazem (CARDIZEM) 30 MG tablet Take 30 mg by mouth as needed (for palpitations).  Marland Kitchen diltiazem (CARTIA XT) 120 MG 24 hr capsule Take 1 capsule (120 mg total) by mouth at bedtime.  . dofetilide (TIKOSYN) 500 MCG capsule Take 1 capsule (500 mcg total) by mouth 2 (two) times daily.  . famotidine (PEPCID) 20 MG tablet Take 1 tablet (20 mg total) by mouth at bedtime.  . fluticasone (FLONASE) 50 MCG/ACT nasal spray Place 1 spray into both nostrils 2 (two) times daily.  Marland Kitchen HYDROcodone-homatropine (HYDROMET) 5-1.5 MG/5ML syrup Take 5 mLs by mouth every 6 (six) hours as needed for cough.  Marland Kitchen ipratropium-albuterol (DUONEB) 0.5-2.5 (3) MG/3ML SOLN Take 3 mLs by nebulization every 6 (six) hours as needed.   Marland Kitchen  losartan (COZAAR) 100 MG tablet Take 1 tablet (100 mg total) by mouth daily.  . Magnesium 250 MG TABS Take 250 mg by mouth daily.  . montelukast (SINGULAIR) 10 MG tablet Take 10 mg by mouth at bedtime.  Marland Kitchen omeprazole (PRILOSEC) 20 MG capsule Take 20 mg by mouth daily.  . potassium chloride SA (K-DUR,KLOR-CON) 20 MEQ tablet Take 1 tablet (20 mEq total) by mouth daily.  . sodium chloride (OCEAN) 0.65 % SOLN nasal spray Place 1 spray into both nostrils 2 (two) times daily.   Marland Kitchen triamcinolone ointment (KENALOG) 0.1 % Apply 1 application topically daily as needed for rash.  .  umeclidinium-vilanterol (ANORO ELLIPTA) 62.5-25 MCG/INH AEPB Inhale 1 puff into the lungs daily.  . WARFARIN SODIUM PO Take by mouth as directed. Patient is taking 5 mg once da ily, 2.5 mg on Monday & Friday.  . Wheat Dextrin (BENEFIBER ON THE GO) POWD Take 1 Dose by mouth daily as needed (constipation). 2 tsp daily in the morning.   No facility-administered encounter medications on file as of 04/26/2018.      Objective: Blood pressure (!) 158/74, pulse 72, temperature 97.7 F (36.5 C), temperature source Oral, resp. rate 18, height 5\' 3"  (1.6 m), weight 194 lb 3.2 oz (88.1 kg). Patient is alert and in no acute distress. He is using a cane to ambulate. Conjunctiva is pink. Sclera is nonicteric Oropharyngeal mucosa is normal. No neck masses or thyromegaly noted. Cardiac exam with regular rhythm normal S1 and S2. No murmur or gallop noted. Lungs are clear to auscultation. Abdomen is full but soft and nontender with no organomegaly or masses. No LE edema or clubbing noted.   Assessment:  #1.  Chronic GERD.  EGD in May 2018 was negative for erosive esophagitis or Barrett's esophagus.  Recent flareup may well be related to sinus symptoms and postnasal discharge.  It appears the addition of famotidine at bedtime has helped a great deal.  Since her symptom control appears to be satisfactory with current combination I do not see any point in changing therapy.  However if she has another relapse will consider dropping omeprazole and try her on the PPI.  Plan:  Patient reassured. She should continue omeprazole 20 mg 30 minutes before breakfast daily and famotidine 40 mg in the evening. Once she has recovered from her foot surgery she can start using famotidine on as needed basis. If symptoms relapse she will call office in which case we will change her therapy. Office visit in 1 year.

## 2018-04-28 DIAGNOSIS — M1712 Unilateral primary osteoarthritis, left knee: Secondary | ICD-10-CM | POA: Diagnosis not present

## 2018-04-28 NOTE — Patient Instructions (Signed)
Patient advised to call office if she has another spell with breakthrough symptoms.

## 2018-05-02 DIAGNOSIS — M1712 Unilateral primary osteoarthritis, left knee: Secondary | ICD-10-CM | POA: Diagnosis not present

## 2018-05-02 DIAGNOSIS — J45909 Unspecified asthma, uncomplicated: Secondary | ICD-10-CM | POA: Diagnosis not present

## 2018-05-02 DIAGNOSIS — Z299 Encounter for prophylactic measures, unspecified: Secondary | ICD-10-CM | POA: Diagnosis not present

## 2018-05-02 DIAGNOSIS — I4891 Unspecified atrial fibrillation: Secondary | ICD-10-CM | POA: Diagnosis not present

## 2018-05-02 DIAGNOSIS — I10 Essential (primary) hypertension: Secondary | ICD-10-CM | POA: Diagnosis not present

## 2018-05-02 DIAGNOSIS — Z6835 Body mass index (BMI) 35.0-35.9, adult: Secondary | ICD-10-CM | POA: Diagnosis not present

## 2018-05-04 DIAGNOSIS — M1712 Unilateral primary osteoarthritis, left knee: Secondary | ICD-10-CM | POA: Diagnosis not present

## 2018-05-05 ENCOUNTER — Telehealth: Payer: Self-pay | Admitting: Internal Medicine

## 2018-05-05 DIAGNOSIS — E2839 Other primary ovarian failure: Secondary | ICD-10-CM | POA: Diagnosis not present

## 2018-05-05 NOTE — Telephone Encounter (Signed)
Pt is calling back [phone]

## 2018-05-05 NOTE — Telephone Encounter (Addendum)
lmtcb x1 for pt. I have spoken to Kathlee Nations regarding this matter. Kathlee Nations stated that the incorrect unit were filed, however this has been corrected and insurance should send out new EOB.

## 2018-05-09 DIAGNOSIS — I10 Essential (primary) hypertension: Secondary | ICD-10-CM | POA: Diagnosis not present

## 2018-05-09 DIAGNOSIS — J441 Chronic obstructive pulmonary disease with (acute) exacerbation: Secondary | ICD-10-CM | POA: Diagnosis not present

## 2018-05-09 DIAGNOSIS — J449 Chronic obstructive pulmonary disease, unspecified: Secondary | ICD-10-CM | POA: Diagnosis not present

## 2018-05-09 DIAGNOSIS — Z6835 Body mass index (BMI) 35.0-35.9, adult: Secondary | ICD-10-CM | POA: Diagnosis not present

## 2018-05-09 DIAGNOSIS — Z299 Encounter for prophylactic measures, unspecified: Secondary | ICD-10-CM | POA: Diagnosis not present

## 2018-05-09 DIAGNOSIS — Z789 Other specified health status: Secondary | ICD-10-CM | POA: Diagnosis not present

## 2018-05-09 NOTE — Telephone Encounter (Signed)
Spoke with pt. She is aware of this information. Nothing further was needed at this time.

## 2018-05-16 DIAGNOSIS — M1712 Unilateral primary osteoarthritis, left knee: Secondary | ICD-10-CM | POA: Diagnosis not present

## 2018-05-17 DIAGNOSIS — M8580 Other specified disorders of bone density and structure, unspecified site: Secondary | ICD-10-CM | POA: Diagnosis not present

## 2018-05-17 DIAGNOSIS — R05 Cough: Secondary | ICD-10-CM | POA: Diagnosis not present

## 2018-05-17 DIAGNOSIS — J019 Acute sinusitis, unspecified: Secondary | ICD-10-CM | POA: Diagnosis not present

## 2018-05-17 DIAGNOSIS — I10 Essential (primary) hypertension: Secondary | ICD-10-CM | POA: Diagnosis not present

## 2018-05-17 DIAGNOSIS — Z6835 Body mass index (BMI) 35.0-35.9, adult: Secondary | ICD-10-CM | POA: Diagnosis not present

## 2018-05-17 DIAGNOSIS — I4891 Unspecified atrial fibrillation: Secondary | ICD-10-CM | POA: Diagnosis not present

## 2018-05-17 DIAGNOSIS — Z299 Encounter for prophylactic measures, unspecified: Secondary | ICD-10-CM | POA: Diagnosis not present

## 2018-05-19 DIAGNOSIS — M545 Low back pain: Secondary | ICD-10-CM | POA: Diagnosis not present

## 2018-05-19 DIAGNOSIS — M9903 Segmental and somatic dysfunction of lumbar region: Secondary | ICD-10-CM | POA: Diagnosis not present

## 2018-05-19 DIAGNOSIS — M9901 Segmental and somatic dysfunction of cervical region: Secondary | ICD-10-CM | POA: Diagnosis not present

## 2018-05-19 DIAGNOSIS — M542 Cervicalgia: Secondary | ICD-10-CM | POA: Diagnosis not present

## 2018-05-19 DIAGNOSIS — M9905 Segmental and somatic dysfunction of pelvic region: Secondary | ICD-10-CM | POA: Diagnosis not present

## 2018-05-19 DIAGNOSIS — M9902 Segmental and somatic dysfunction of thoracic region: Secondary | ICD-10-CM | POA: Diagnosis not present

## 2018-05-19 DIAGNOSIS — M546 Pain in thoracic spine: Secondary | ICD-10-CM | POA: Diagnosis not present

## 2018-05-23 DIAGNOSIS — M545 Low back pain: Secondary | ICD-10-CM | POA: Diagnosis not present

## 2018-05-23 DIAGNOSIS — M542 Cervicalgia: Secondary | ICD-10-CM | POA: Diagnosis not present

## 2018-05-23 DIAGNOSIS — M546 Pain in thoracic spine: Secondary | ICD-10-CM | POA: Diagnosis not present

## 2018-05-23 DIAGNOSIS — M9905 Segmental and somatic dysfunction of pelvic region: Secondary | ICD-10-CM | POA: Diagnosis not present

## 2018-05-23 DIAGNOSIS — M9902 Segmental and somatic dysfunction of thoracic region: Secondary | ICD-10-CM | POA: Diagnosis not present

## 2018-05-23 DIAGNOSIS — M9901 Segmental and somatic dysfunction of cervical region: Secondary | ICD-10-CM | POA: Diagnosis not present

## 2018-05-23 DIAGNOSIS — M9903 Segmental and somatic dysfunction of lumbar region: Secondary | ICD-10-CM | POA: Diagnosis not present

## 2018-05-24 DIAGNOSIS — M1712 Unilateral primary osteoarthritis, left knee: Secondary | ICD-10-CM | POA: Diagnosis not present

## 2018-05-26 ENCOUNTER — Telehealth: Payer: Self-pay | Admitting: Internal Medicine

## 2018-05-26 DIAGNOSIS — M1712 Unilateral primary osteoarthritis, left knee: Secondary | ICD-10-CM | POA: Diagnosis not present

## 2018-05-26 NOTE — Telephone Encounter (Signed)
Called [name] [name] scheduled appt for 06/02/2018 at 3:15. CMA will skype or call me when Dr. Annamaria Boots has seen [name] [name]., then I'll go get her from the lobby. Unless CMA brings [name] [name] over here. Nothing further needed.

## 2018-05-30 ENCOUNTER — Telehealth: Payer: Self-pay | Admitting: Internal Medicine

## 2018-05-30 ENCOUNTER — Other Ambulatory Visit (HOSPITAL_COMMUNITY): Payer: Self-pay | Admitting: *Deleted

## 2018-05-30 ENCOUNTER — Ambulatory Visit: Payer: Medicare Other

## 2018-05-30 MED ORDER — DOFETILIDE 500 MCG PO CAPS: 500.0000 ug | ORAL_CAPSULE | Freq: Two times a day (BID) | ORAL | 3 refills | Status: DC

## 2018-05-30 NOTE — Telephone Encounter (Signed)
1 vial Arrival Date:05/30/2018 Lot #: [id number] Exp Date:11/2021

## 2018-05-31 ENCOUNTER — Other Ambulatory Visit (HOSPITAL_COMMUNITY): Payer: Self-pay | Admitting: *Deleted

## 2018-05-31 DIAGNOSIS — I10 Essential (primary) hypertension: Secondary | ICD-10-CM | POA: Diagnosis not present

## 2018-05-31 DIAGNOSIS — I4891 Unspecified atrial fibrillation: Secondary | ICD-10-CM | POA: Diagnosis not present

## 2018-05-31 DIAGNOSIS — K219 Gastro-esophageal reflux disease without esophagitis: Secondary | ICD-10-CM | POA: Diagnosis not present

## 2018-05-31 DIAGNOSIS — M1712 Unilateral primary osteoarthritis, left knee: Secondary | ICD-10-CM | POA: Diagnosis not present

## 2018-05-31 DIAGNOSIS — M8580 Other specified disorders of bone density and structure, unspecified site: Secondary | ICD-10-CM | POA: Diagnosis not present

## 2018-05-31 DIAGNOSIS — Z299 Encounter for prophylactic measures, unspecified: Secondary | ICD-10-CM | POA: Diagnosis not present

## 2018-05-31 DIAGNOSIS — Z6835 Body mass index (BMI) 35.0-35.9, adult: Secondary | ICD-10-CM | POA: Diagnosis not present

## 2018-05-31 MED ORDER — DOFETILIDE 500 MCG PO CAPS: 500.0000 ug | ORAL_CAPSULE | Freq: Two times a day (BID) | ORAL | 1 refills | Status: DC

## 2018-06-02 ENCOUNTER — Ambulatory Visit (INDEPENDENT_AMBULATORY_CARE_PROVIDER_SITE_OTHER): Payer: Medicare Other

## 2018-06-02 ENCOUNTER — Encounter: Payer: Self-pay | Admitting: Internal Medicine

## 2018-06-02 ENCOUNTER — Ambulatory Visit (INDEPENDENT_AMBULATORY_CARE_PROVIDER_SITE_OTHER): Payer: Medicare Other | Admitting: Internal Medicine

## 2018-06-02 VITALS — BP 100/50 | HR 51 | Ht 63.0 in | Wt 199.6 lb

## 2018-06-02 DIAGNOSIS — M1712 Unilateral primary osteoarthritis, left knee: Secondary | ICD-10-CM | POA: Diagnosis not present

## 2018-06-02 DIAGNOSIS — J4520 Mild intermittent asthma, uncomplicated: Secondary | ICD-10-CM

## 2018-06-02 DIAGNOSIS — I4819 Other persistent atrial fibrillation: Secondary | ICD-10-CM

## 2018-06-02 DIAGNOSIS — J4550 Severe persistent asthma, uncomplicated: Secondary | ICD-10-CM | POA: Diagnosis not present

## 2018-06-02 DIAGNOSIS — J0100 Acute maxillary sinusitis, unspecified: Secondary | ICD-10-CM | POA: Diagnosis not present

## 2018-06-02 LAB — CBC WITH DIFFERENTIAL/PLATELET
Basophils Absolute: 0 10*3/uL (ref 0.0–0.1)
Basophils Relative: 0.5 % (ref 0.0–3.0)
EOS ABS: 0 10*3/uL (ref 0.0–0.7)
Eosinophils Relative: 0.5 % (ref 0.0–5.0)
HCT: 34.2 % — ABNORMAL LOW (ref 36.0–46.0)
Hemoglobin: 11.3 g/dL — ABNORMAL LOW (ref 12.0–15.0)
Lymphocytes Relative: 16.1 % (ref 12.0–46.0)
Lymphs Abs: 1.3 10*3/uL (ref 0.7–4.0)
MCHC: 33 g/dL (ref 30.0–36.0)
MCV: 82.7 fl (ref 78.0–100.0)
MONO ABS: 0.9 10*3/uL (ref 0.1–1.0)
Monocytes Relative: 11.8 % (ref 3.0–12.0)
Neutro Abs: 5.7 10*3/uL (ref 1.4–7.7)
Neutrophils Relative %: 71.1 % (ref 43.0–77.0)
PLATELETS: 167 10*3/uL (ref 150.0–400.0)
RBC: 4.14 Mil/uL (ref 3.87–5.11)
RDW: 17 % — ABNORMAL HIGH (ref 11.5–15.5)
WBC: 8 10*3/uL (ref 4.0–10.5)

## 2018-06-02 LAB — SEDIMENTATION RATE: Sed Rate: 14 mm/hr (ref 0–30)

## 2018-06-02 MED ORDER — AMOXICILLIN-POT CLAVULANATE 875-125 MG PO TABS: 1.0000 | ORAL_TABLET | Freq: Two times a day (BID) | ORAL | 0 refills | Status: DC

## 2018-06-02 MED ORDER — MEPOLIZUMAB 100 MG ~~LOC~~ SOLR
100.0000 mg | Freq: Once | SUBCUTANEOUS | Status: AC
  Administered 2018-06-02: 100 mg via SUBCUTANEOUS

## 2018-06-02 NOTE — Patient Instructions (Addendum)
Order- lab- CBC w diff, Sedimentation rate, ANCA   Dx Acute maxillary sinusitis, Asthma severe persistent  Script sent for augmentin antibiotic  Please call if we can help

## 2018-06-02 NOTE — Progress Notes (Signed)
HPI female never smoker followed for OSA, allergic rhinitis, asthma, Hyper IgE,  complicated by A. Fib/Coumadin, HBP, GERD, obesity NPSG prior to EMR in 2012 Office Spirometry 09/02/16-WNL. FVC 2.06/76%, FEV1 1.57/77%, ratio 0.76, FEF 25-75% 1.26/80% Allergy labs 09/02/16- eosinophils 200, total IgE 4230 causing elevation of all specific allergen antibody levels including Aspergillus. Nucala  + 2018 PFT-10/13/16-minimal obstruction without response to dilator, minimal reduction of diffusion. FVC 2.02/77%, FEV1 1.62/83%, ratio 0.80, TLC 92%, DLCO 77% CT soft tissue neck 09/22/16-1. 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 Subsegmental atelectasis and nonspecific linear patchy densities in the lungs as described. They have a benign appearance.  Small hyperdense masses in the mediastinum are stable compared with 2015 supporting benign etiology such as ectopic thyroid tissue. Three-vessel prominent coronary artery calcification. Bibasilar bronchiolectasis. Labs 09/20/2017-WBC 14,900, hemoglobin 11.2, eosinophils absent, lymphocytes low( ?steroids or viral?), BNP 220, CMET wnl IgE 04/12/2018- 2,574, 10/16/16- 4,302, 11/09/13- 3,513 ------------------------------------------------------------------------------------------- 04/12/2018- 78 year old female never smoker followed for OSA, allergic rhinitis, asthma/ Nucala, complicated by A. Fib/Coumadin, HBP, GERD, obesity CPAP 9/Advanced -----Asthma: Pt here due to asthma exacerbation per surgeon; had to post pone surgery x 6 weeks. Pt is on Nucala every 28 days as well. Pt notes ? allergy issues-sneezing and itchy watery eyes.  Neb Pulmicort, neb DuoNeb, Singulair, Anoro,  Since Thanksgiving she has had nausea blamed on postnasal drip and a minimal sense of nasal stuffiness and felt at the bridge of the nose.  Cough and nasal discharge productive of white.  Sneezing more in the last 2 months.   Podiatric surgery was deferred by anesthesia because she was wheezing on exam. She volunteers she thinks her wheezing is related to reflux.  Pending follow-up appointment soon with Dr.Ramon/ GI. She feels she cannot sleep without CPAP and is comfortable with pressure 9 but does notice some nasal congestion. Today as I talked with her, she was initially clear.  Then she leaned forward, began paroxysmal dry cough, followed by diffuse inspiratory and expiratory wheezing which was self-limited over about a minute. She states that sustained coughing can be a trigger for her A. Fib. Office Spirometry 04/12/2018-mild restriction of exhaled volume.  Possible mild obstructive airways.  FVC 1.9/73%, FEV1 1.4/75%, ratio 1.76, FEF 25-75% 1.2/80%  06/02/2018- 78 year old female never smoker followed for OSA, allergic rhinitis, asthma/ Nucala, Hyper IgE, complicated by A. Fib/Coumadin, HBP, GERD, obesity CPAP 9/Advanced -----pt presents with sinus congestion, mainly on L side, breathing better, still has "coughing spells"; denies chest tightness/pain; takes Anoro Anoro, Singulair, neb Duoneb/ budesonide, Proair hfa, tessalon 200 Had persistent left maxillary sinus pain for 2 weeks with no bleeding on warfarin.  Ears okay left upper rear teeth sore.  No fever. She feels her asthma control is better on new Calla. She had tolerated amoxicillin without problems despite childhood history of intolerance to penicillin.  Frequent pneumonias as a child. Now pending foot surgery. Asks bariatric counseling for weight loss.  ROS-see HPI   + = positive Constitutional:   No-   weight loss, night sweats, fevers, chills, fatigue, lassitude. HEENT:   No-  headaches, difficulty swallowing, tooth/dental problems, sore throat,       No-  sneezing, itching,  ear ache,  +nasal congestion, post nasal drip,  CV:  No-   chest pain, orthopnea, PND, swelling in lower extremities, anasarca, dizziness, palpitations Resp: +  shortness of  breath with exertion or at rest.  productive cough,  + non-productive cough,  No- coughing up of blood.              No-change in color of mucus. + wheezing.   Skin: No-   rash or lesions. GI:  No-   heartburn, indigestion, abdominal pain, nausea, vomiting, GU:  MS:  No-   joint pain or swelling. . Neuro-     nothing unusual Psych:  No- change in mood or affect. No depression or anxiety.  No memory loss.  OBJ- Physical Exam General- Alert, Oriented, Affect-appropriate, Distress- none acute, + obese Skin- rash-none, lesions- none, excoriation- none Lymphadenopathy- none Head- atraumatic            Eyes- Gross vision intact, PERRLA, conjunctivae and secretions clear            Ears- Hearing, canals-normal            Nose- +turbinate edema, no-Septal dev, mucus, polyps, erosion, perforation             Throat- Mallampati III-IV , mucosa- not red,  drainage- none, tonsils- atrophic Neck- flexible , trachea midline, no stridor , thyroid nl, carotid no bruit Chest - symmetrical excursion , unlabored           Heart/CV- RR/ very faint (no pacemaker) , no murmur , no gallop  , no rub, nl  s1 s2                  - JVD- none , edema- none, stasis changes- none, varices- none           Lung-  Wheeze-none , cough-none,                         dullness-none, rub- none           Chest wall-  Abd- Br/ Gen/ Rectal- Not done, not indicated Extrem- cyanosis- none, clubbing, none, atrophy- none, strength- nl Neuro- grossly intact to observation

## 2018-06-03 DIAGNOSIS — J320 Chronic maxillary sinusitis: Secondary | ICD-10-CM | POA: Insufficient documentation

## 2018-06-03 DIAGNOSIS — J0100 Acute maxillary sinusitis, unspecified: Secondary | ICD-10-CM | POA: Insufficient documentation

## 2018-06-03 NOTE — Assessment & Plan Note (Signed)
Rhythm is regular or nearly so at this visit. Followed by cardiology

## 2018-06-03 NOTE — Assessment & Plan Note (Signed)
Acute left maxillary sinusitis

## 2018-06-03 NOTE — Assessment & Plan Note (Signed)
Plan - given contact info for Bariatric program

## 2018-06-03 NOTE — Assessment & Plan Note (Signed)
Control seems better on Nucala. Chronic hyper IgE. Now with sinusitis, will check some labs questioning Churg-Strauss Asthma control is currently good. Plan- CBC w diff, ANCA, Sed rate

## 2018-06-06 DIAGNOSIS — M1712 Unilateral primary osteoarthritis, left knee: Secondary | ICD-10-CM | POA: Diagnosis not present

## 2018-06-06 LAB — ANCA SCREEN W REFLEX TITER: ANCA Screen: NEGATIVE

## 2018-06-07 DIAGNOSIS — Z7901 Long term (current) use of anticoagulants: Secondary | ICD-10-CM | POA: Diagnosis not present

## 2018-06-07 DIAGNOSIS — Z5181 Encounter for therapeutic drug level monitoring: Secondary | ICD-10-CM | POA: Diagnosis not present

## 2018-06-07 DIAGNOSIS — I480 Paroxysmal atrial fibrillation: Secondary | ICD-10-CM | POA: Diagnosis not present

## 2018-06-09 ENCOUNTER — Telehealth: Payer: Self-pay | Admitting: Cardiology

## 2018-06-09 DIAGNOSIS — M1712 Unilateral primary osteoarthritis, left knee: Secondary | ICD-10-CM | POA: Diagnosis not present

## 2018-06-09 NOTE — Telephone Encounter (Signed)
Patient c/o feeling more tired and feeling heart skipping more that started on yesterday. Patient also says that she has more sob and is seeing a pulmonologist for the sob. No c/o dizziness or chest pain. Patient called requesting to have someone listen to her heart. Patient advised that she can come to the office tomorrow for an EKG and if her symptoms got worse between now and then, to go to the ED for an evaluation. Patient aware that this visit is not to clear her for surgery. Patient advised that she is to follow her surgeons instructions for her medications. Verbalized understanding.

## 2018-06-09 NOTE — Telephone Encounter (Signed)
Patient called stated that she has felt that her heart is skipping beats.  She is concerned because she is to have foot surgery next week and has to come off coumadin tomorrow.  Would like to know if she can be checked out and make sure she can still have surgery

## 2018-06-10 ENCOUNTER — Ambulatory Visit (INDEPENDENT_AMBULATORY_CARE_PROVIDER_SITE_OTHER): Payer: Medicare Other | Admitting: *Deleted

## 2018-06-10 ENCOUNTER — Other Ambulatory Visit: Payer: Self-pay

## 2018-06-10 VITALS — BP 150/64 | HR 56 | Ht 63.0 in | Wt 195.0 lb

## 2018-06-10 DIAGNOSIS — I4819 Other persistent atrial fibrillation: Secondary | ICD-10-CM

## 2018-06-10 NOTE — Patient Instructions (Signed)
Continue current treatment plan. 

## 2018-06-10 NOTE — Progress Notes (Signed)
Presents to office today for nurse visit and EKG per telephone call on yesterday and c/o heart skipping. Alert and oriented x's 4.  Lungs clear in all lobes, S1 & S2 heard and regular. No c/o dizziness, chest pain, or increased sob. Has sob all the time and currently being treated by pulmonologist. Has taken all doses of medications without Medications reconciled.

## 2018-06-13 ENCOUNTER — Other Ambulatory Visit: Payer: Self-pay | Admitting: Internal Medicine

## 2018-06-13 ENCOUNTER — Telehealth: Payer: Self-pay | Admitting: Internal Medicine

## 2018-06-13 DIAGNOSIS — G4733 Obstructive sleep apnea (adult) (pediatric): Secondary | ICD-10-CM

## 2018-06-13 DIAGNOSIS — M1712 Unilateral primary osteoarthritis, left knee: Secondary | ICD-10-CM | POA: Diagnosis not present

## 2018-06-13 NOTE — Telephone Encounter (Signed)
Called and spoke with patient, advised her that the prescription has already be sent in. Patient verbalized understanding. While on the phone with the patient she stated that she wanted to switch from [facility] over to [facility] for all of her CPAP needs. CY please advise if this is ok. Thank you.

## 2018-06-13 NOTE — Telephone Encounter (Signed)
Can you get contact info for [facility] and verify with them that they do provide CPAP care Make sure the company name and contact info are provided to all the PCCs so people know. Thanks.

## 2018-06-13 NOTE — Telephone Encounter (Signed)
Yes, ok to switch to [facility]

## 2018-06-13 NOTE — Telephone Encounter (Signed)
Spoke with patient. She is actually requesting to switch to [facility] since [facility] no one has an office in [place] (where the patient lives).   Dr. Annamaria Boots, are you still ok with this switch?

## 2018-06-13 NOTE — Telephone Encounter (Signed)
Called and spoke with Patient.  Patient made aware of DME order being placed.  Understanding stated.  DME order for change to [facility] for cpap supplies placed.  Nothing further at this time.

## 2018-06-15 ENCOUNTER — Other Ambulatory Visit (HOSPITAL_COMMUNITY): Payer: Self-pay | Admitting: *Deleted

## 2018-06-15 DIAGNOSIS — E785 Hyperlipidemia, unspecified: Secondary | ICD-10-CM | POA: Diagnosis not present

## 2018-06-15 DIAGNOSIS — M2012 Hallux valgus (acquired), left foot: Secondary | ICD-10-CM | POA: Diagnosis not present

## 2018-06-15 DIAGNOSIS — G4733 Obstructive sleep apnea (adult) (pediatric): Secondary | ICD-10-CM | POA: Diagnosis not present

## 2018-06-15 DIAGNOSIS — M2042 Other hammer toe(s) (acquired), left foot: Secondary | ICD-10-CM | POA: Diagnosis not present

## 2018-06-15 DIAGNOSIS — J302 Other seasonal allergic rhinitis: Secondary | ICD-10-CM | POA: Diagnosis not present

## 2018-06-15 DIAGNOSIS — M2062 Acquired deformities of toe(s), unspecified, left foot: Secondary | ICD-10-CM | POA: Diagnosis not present

## 2018-06-15 DIAGNOSIS — K219 Gastro-esophageal reflux disease without esophagitis: Secondary | ICD-10-CM | POA: Diagnosis not present

## 2018-06-15 DIAGNOSIS — Z79899 Other long term (current) drug therapy: Secondary | ICD-10-CM | POA: Diagnosis not present

## 2018-06-15 DIAGNOSIS — I10 Essential (primary) hypertension: Secondary | ICD-10-CM | POA: Diagnosis not present

## 2018-06-15 DIAGNOSIS — M810 Age-related osteoporosis without current pathological fracture: Secondary | ICD-10-CM | POA: Diagnosis not present

## 2018-06-15 DIAGNOSIS — Z6836 Body mass index (BMI) 36.0-36.9, adult: Secondary | ICD-10-CM | POA: Diagnosis not present

## 2018-06-15 MED ORDER — POTASSIUM CHLORIDE CRYS ER 20 MEQ PO TBCR: 20.0000 meq | EXTENDED_RELEASE_TABLET | Freq: Every day | ORAL | 2 refills | Status: DC

## 2018-06-20 DIAGNOSIS — N3001 Acute cystitis with hematuria: Secondary | ICD-10-CM | POA: Diagnosis not present

## 2018-06-20 DIAGNOSIS — M2062 Acquired deformities of toe(s), unspecified, left foot: Secondary | ICD-10-CM | POA: Diagnosis not present

## 2018-06-20 DIAGNOSIS — I4891 Unspecified atrial fibrillation: Secondary | ICD-10-CM | POA: Diagnosis not present

## 2018-06-20 DIAGNOSIS — M2012 Hallux valgus (acquired), left foot: Secondary | ICD-10-CM | POA: Diagnosis not present

## 2018-06-20 DIAGNOSIS — Z7901 Long term (current) use of anticoagulants: Secondary | ICD-10-CM | POA: Diagnosis not present

## 2018-06-20 DIAGNOSIS — B373 Candidiasis of vulva and vagina: Secondary | ICD-10-CM | POA: Diagnosis not present

## 2018-06-20 DIAGNOSIS — R300 Dysuria: Secondary | ICD-10-CM | POA: Diagnosis not present

## 2018-06-29 DIAGNOSIS — I4891 Unspecified atrial fibrillation: Secondary | ICD-10-CM | POA: Diagnosis not present

## 2018-06-29 DIAGNOSIS — Z713 Dietary counseling and surveillance: Secondary | ICD-10-CM | POA: Diagnosis not present

## 2018-06-29 DIAGNOSIS — Z299 Encounter for prophylactic measures, unspecified: Secondary | ICD-10-CM | POA: Diagnosis not present

## 2018-06-29 DIAGNOSIS — Z6835 Body mass index (BMI) 35.0-35.9, adult: Secondary | ICD-10-CM | POA: Diagnosis not present

## 2018-06-29 DIAGNOSIS — I10 Essential (primary) hypertension: Secondary | ICD-10-CM | POA: Diagnosis not present

## 2018-07-05 DIAGNOSIS — M898X7 Other specified disorders of bone, ankle and foot: Secondary | ICD-10-CM | POA: Diagnosis not present

## 2018-07-05 DIAGNOSIS — Z4802 Encounter for removal of sutures: Secondary | ICD-10-CM | POA: Diagnosis not present

## 2018-07-05 DIAGNOSIS — I70212 Atherosclerosis of native arteries of extremities with intermittent claudication, left leg: Secondary | ICD-10-CM | POA: Diagnosis not present

## 2018-07-05 DIAGNOSIS — S9032XA Contusion of left foot, initial encounter: Secondary | ICD-10-CM | POA: Diagnosis not present

## 2018-07-05 DIAGNOSIS — Z981 Arthrodesis status: Secondary | ICD-10-CM | POA: Diagnosis not present

## 2018-07-05 DIAGNOSIS — M7732 Calcaneal spur, left foot: Secondary | ICD-10-CM | POA: Diagnosis not present

## 2018-07-05 DIAGNOSIS — Z9889 Other specified postprocedural states: Secondary | ICD-10-CM | POA: Diagnosis not present

## 2018-07-05 DIAGNOSIS — M2062 Acquired deformities of toe(s), unspecified, left foot: Secondary | ICD-10-CM | POA: Diagnosis not present

## 2018-07-05 DIAGNOSIS — T85848A Pain due to other internal prosthetic devices, implants and grafts, initial encounter: Secondary | ICD-10-CM | POA: Diagnosis not present

## 2018-07-05 DIAGNOSIS — M2012 Hallux valgus (acquired), left foot: Secondary | ICD-10-CM | POA: Diagnosis not present

## 2018-07-05 DIAGNOSIS — M85872 Other specified disorders of bone density and structure, left ankle and foot: Secondary | ICD-10-CM | POA: Diagnosis not present

## 2018-07-08 DIAGNOSIS — Z6835 Body mass index (BMI) 35.0-35.9, adult: Secondary | ICD-10-CM | POA: Diagnosis not present

## 2018-07-08 DIAGNOSIS — M2012 Hallux valgus (acquired), left foot: Secondary | ICD-10-CM | POA: Diagnosis not present

## 2018-07-08 DIAGNOSIS — T84123A Displacement of internal fixation device of bone of left forearm, initial encounter: Secondary | ICD-10-CM | POA: Diagnosis not present

## 2018-07-08 DIAGNOSIS — T85848A Pain due to other internal prosthetic devices, implants and grafts, initial encounter: Secondary | ICD-10-CM | POA: Diagnosis not present

## 2018-07-08 DIAGNOSIS — G4733 Obstructive sleep apnea (adult) (pediatric): Secondary | ICD-10-CM | POA: Diagnosis not present

## 2018-07-08 DIAGNOSIS — I4891 Unspecified atrial fibrillation: Secondary | ICD-10-CM | POA: Diagnosis not present

## 2018-07-08 DIAGNOSIS — T8489XA Other specified complication of internal orthopedic prosthetic devices, implants and grafts, initial encounter: Secondary | ICD-10-CM | POA: Diagnosis not present

## 2018-07-08 DIAGNOSIS — Z7901 Long term (current) use of anticoagulants: Secondary | ICD-10-CM | POA: Diagnosis not present

## 2018-07-08 DIAGNOSIS — J45909 Unspecified asthma, uncomplicated: Secondary | ICD-10-CM | POA: Diagnosis not present

## 2018-07-08 DIAGNOSIS — Z9989 Dependence on other enabling machines and devices: Secondary | ICD-10-CM | POA: Diagnosis not present

## 2018-07-08 DIAGNOSIS — Z79899 Other long term (current) drug therapy: Secondary | ICD-10-CM | POA: Diagnosis not present

## 2018-07-11 DIAGNOSIS — M2062 Acquired deformities of toe(s), unspecified, left foot: Secondary | ICD-10-CM | POA: Diagnosis not present

## 2018-07-11 DIAGNOSIS — M2012 Hallux valgus (acquired), left foot: Secondary | ICD-10-CM | POA: Diagnosis not present

## 2018-07-11 DIAGNOSIS — S9032XA Contusion of left foot, initial encounter: Secondary | ICD-10-CM | POA: Diagnosis not present

## 2018-07-11 DIAGNOSIS — T85848A Pain due to other internal prosthetic devices, implants and grafts, initial encounter: Secondary | ICD-10-CM | POA: Diagnosis not present

## 2018-07-13 DIAGNOSIS — M87059 Idiopathic aseptic necrosis of unspecified femur: Secondary | ICD-10-CM | POA: Diagnosis not present

## 2018-07-13 DIAGNOSIS — I4891 Unspecified atrial fibrillation: Secondary | ICD-10-CM | POA: Diagnosis not present

## 2018-07-13 DIAGNOSIS — Z299 Encounter for prophylactic measures, unspecified: Secondary | ICD-10-CM | POA: Diagnosis not present

## 2018-07-13 DIAGNOSIS — I10 Essential (primary) hypertension: Secondary | ICD-10-CM | POA: Diagnosis not present

## 2018-07-13 DIAGNOSIS — N390 Urinary tract infection, site not specified: Secondary | ICD-10-CM | POA: Diagnosis not present

## 2018-07-13 DIAGNOSIS — Z6835 Body mass index (BMI) 35.0-35.9, adult: Secondary | ICD-10-CM | POA: Diagnosis not present

## 2018-07-15 DIAGNOSIS — M9905 Segmental and somatic dysfunction of pelvic region: Secondary | ICD-10-CM | POA: Diagnosis not present

## 2018-07-15 DIAGNOSIS — M545 Low back pain: Secondary | ICD-10-CM | POA: Diagnosis not present

## 2018-07-15 DIAGNOSIS — M546 Pain in thoracic spine: Secondary | ICD-10-CM | POA: Diagnosis not present

## 2018-07-15 DIAGNOSIS — R079 Chest pain, unspecified: Secondary | ICD-10-CM | POA: Diagnosis not present

## 2018-07-15 DIAGNOSIS — M9903 Segmental and somatic dysfunction of lumbar region: Secondary | ICD-10-CM | POA: Diagnosis not present

## 2018-07-15 DIAGNOSIS — M542 Cervicalgia: Secondary | ICD-10-CM | POA: Diagnosis not present

## 2018-07-15 DIAGNOSIS — Z299 Encounter for prophylactic measures, unspecified: Secondary | ICD-10-CM | POA: Diagnosis not present

## 2018-07-15 DIAGNOSIS — Z6835 Body mass index (BMI) 35.0-35.9, adult: Secondary | ICD-10-CM | POA: Diagnosis not present

## 2018-07-15 DIAGNOSIS — M9902 Segmental and somatic dysfunction of thoracic region: Secondary | ICD-10-CM | POA: Diagnosis not present

## 2018-07-15 DIAGNOSIS — I4891 Unspecified atrial fibrillation: Secondary | ICD-10-CM | POA: Diagnosis not present

## 2018-07-15 DIAGNOSIS — K649 Unspecified hemorrhoids: Secondary | ICD-10-CM | POA: Diagnosis not present

## 2018-07-15 DIAGNOSIS — M9901 Segmental and somatic dysfunction of cervical region: Secondary | ICD-10-CM | POA: Diagnosis not present

## 2018-07-15 DIAGNOSIS — J45909 Unspecified asthma, uncomplicated: Secondary | ICD-10-CM | POA: Diagnosis not present

## 2018-07-18 DIAGNOSIS — Z1339 Encounter for screening examination for other mental health and behavioral disorders: Secondary | ICD-10-CM | POA: Diagnosis not present

## 2018-07-18 DIAGNOSIS — Z7189 Other specified counseling: Secondary | ICD-10-CM | POA: Diagnosis not present

## 2018-07-18 DIAGNOSIS — J449 Chronic obstructive pulmonary disease, unspecified: Secondary | ICD-10-CM | POA: Diagnosis not present

## 2018-07-18 DIAGNOSIS — M2062 Acquired deformities of toe(s), unspecified, left foot: Secondary | ICD-10-CM | POA: Diagnosis not present

## 2018-07-18 DIAGNOSIS — E559 Vitamin D deficiency, unspecified: Secondary | ICD-10-CM | POA: Diagnosis not present

## 2018-07-18 DIAGNOSIS — Z6835 Body mass index (BMI) 35.0-35.9, adult: Secondary | ICD-10-CM | POA: Diagnosis not present

## 2018-07-18 DIAGNOSIS — Z0000 Encounter for general adult medical examination without abnormal findings: Secondary | ICD-10-CM | POA: Diagnosis not present

## 2018-07-18 DIAGNOSIS — R5383 Other fatigue: Secondary | ICD-10-CM | POA: Diagnosis not present

## 2018-07-18 DIAGNOSIS — Z1211 Encounter for screening for malignant neoplasm of colon: Secondary | ICD-10-CM | POA: Diagnosis not present

## 2018-07-18 DIAGNOSIS — T85848A Pain due to other internal prosthetic devices, implants and grafts, initial encounter: Secondary | ICD-10-CM | POA: Diagnosis not present

## 2018-07-18 DIAGNOSIS — M2012 Hallux valgus (acquired), left foot: Secondary | ICD-10-CM | POA: Diagnosis not present

## 2018-07-18 DIAGNOSIS — I4891 Unspecified atrial fibrillation: Secondary | ICD-10-CM | POA: Diagnosis not present

## 2018-07-18 DIAGNOSIS — Z1331 Encounter for screening for depression: Secondary | ICD-10-CM | POA: Diagnosis not present

## 2018-07-18 DIAGNOSIS — E7800 Pure hypercholesterolemia, unspecified: Secondary | ICD-10-CM | POA: Diagnosis not present

## 2018-07-22 ENCOUNTER — Telehealth: Payer: Self-pay | Admitting: Internal Medicine

## 2018-07-22 DIAGNOSIS — G4733 Obstructive sleep apnea (adult) (pediatric): Secondary | ICD-10-CM

## 2018-07-22 NOTE — Telephone Encounter (Signed)
Yes ok to switch her CPAP order to [facility] at patient request, with update supplies needed

## 2018-07-22 NOTE — Telephone Encounter (Signed)
Called & spoke w/ pt to inform her of CY's approval for an order to switch DME to [place]. Pt verbalized understanding with no additional questions.   Order for DME switch to [facility] w/ CPAP supply renewal has been placed. Nothing further needed at this time.

## 2018-07-22 NOTE — Telephone Encounter (Signed)
Call returned to patient, patient is requesting a switch to [place] for her cpap supplies. She inquired as to whether the order was sent to [facility]. I made her aware the order was sent. She states they told her they never received it. She is still requesting the order be sent to [facility]. Patient states she had her sleep study done at [facility].   CY please advise if we can send in order for patient to switch dme companies.

## 2018-07-25 DIAGNOSIS — Z981 Arthrodesis status: Secondary | ICD-10-CM | POA: Diagnosis not present

## 2018-07-25 DIAGNOSIS — M2062 Acquired deformities of toe(s), unspecified, left foot: Secondary | ICD-10-CM | POA: Diagnosis not present

## 2018-07-25 DIAGNOSIS — M2012 Hallux valgus (acquired), left foot: Secondary | ICD-10-CM | POA: Diagnosis not present

## 2018-07-25 DIAGNOSIS — T85848A Pain due to other internal prosthetic devices, implants and grafts, initial encounter: Secondary | ICD-10-CM | POA: Diagnosis not present

## 2018-07-27 ENCOUNTER — Other Ambulatory Visit: Payer: Self-pay | Admitting: Internal Medicine

## 2018-07-27 ENCOUNTER — Telehealth: Payer: Self-pay | Admitting: *Deleted

## 2018-07-27 MED ORDER — UMECLIDINIUM-VILANTEROL 62.5-25 MCG/INH IN AEPB: INHALATION_SPRAY | RESPIRATORY_TRACT | 4 refills | Status: DC

## 2018-07-28 NOTE — Telephone Encounter (Signed)
Called pt, she asked me if she should come on in and get her shot. I asked her how she was feeling and if she was having any sx. Pt said she was coughing and sneezing more.  I transferred pt up front to get an appt with CY then Danielle transferred pt back to me. I scheduled her Nucala inj after she sees CY. Nothing further needed.

## 2018-07-29 ENCOUNTER — Telehealth: Payer: Self-pay | Admitting: Internal Medicine

## 2018-07-29 NOTE — Telephone Encounter (Signed)
Nucala Order: 100mg  #1 Vial Order Date: 07/29/2018 Expected date of arrival: 08/01/2018 Ordered by: Desmond Dike, [place]

## 2018-08-02 NOTE — Telephone Encounter (Signed)
Nucala Shipment Received: 100mg  #1 vial Medication arrival date:08/02/2018 Lot #: [id number] Medication exp date:12/2021 Received by: Desmond Dike, [place]

## 2018-08-04 ENCOUNTER — Encounter: Payer: Self-pay | Admitting: Internal Medicine

## 2018-08-04 ENCOUNTER — Ambulatory Visit (INDEPENDENT_AMBULATORY_CARE_PROVIDER_SITE_OTHER): Payer: Medicare Other

## 2018-08-04 ENCOUNTER — Other Ambulatory Visit: Payer: Self-pay

## 2018-08-04 ENCOUNTER — Ambulatory Visit (INDEPENDENT_AMBULATORY_CARE_PROVIDER_SITE_OTHER): Payer: Medicare Other | Admitting: Internal Medicine

## 2018-08-04 VITALS — BP 118/58 | HR 56 | Ht 63.0 in

## 2018-08-04 DIAGNOSIS — G4733 Obstructive sleep apnea (adult) (pediatric): Secondary | ICD-10-CM | POA: Diagnosis not present

## 2018-08-04 DIAGNOSIS — J4550 Severe persistent asthma, uncomplicated: Secondary | ICD-10-CM | POA: Diagnosis not present

## 2018-08-04 DIAGNOSIS — J4520 Mild intermittent asthma, uncomplicated: Secondary | ICD-10-CM

## 2018-08-04 MED ORDER — MEPOLIZUMAB 100 MG ~~LOC~~ SOLR
100.0000 mg | Freq: Once | SUBCUTANEOUS | Status: AC
  Administered 2018-08-04: 13:00:00 100 mg via SUBCUTANEOUS

## 2018-08-04 MED ORDER — UMECLIDINIUM-VILANTEROL 62.5-25 MCG/INH IN AEPB: 1.0000 | INHALATION_SPRAY | Freq: Every day | RESPIRATORY_TRACT | 0 refills | Status: DC

## 2018-08-04 NOTE — Progress Notes (Signed)
HPI female never smoker followed for OSA, allergic rhinitis, asthma, Hyper IgE,  complicated by A. Fib/Coumadin, HBP, GERD, obesity NPSG prior to EMR in 2012 Office Spirometry 09/02/16-WNL. FVC 2.06/76%, FEV1 1.57/77%, ratio 0.76, FEF 25-75% 1.26/80% Allergy labs 09/02/16- eosinophils 200, total IgE 4230 causing elevation of all specific allergen antibody levels including Aspergillus. Nucala  + 2018 PFT-10/13/16-minimal obstruction without response to dilator, minimal reduction of diffusion. FVC 2.02/77%, FEV1 1.62/83%, ratio 0.80, TLC 92%, DLCO 77% CT soft tissue neck 09/22/16-1. 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 Subsegmental atelectasis and nonspecific linear patchy densities in the lungs as described. They have a benign appearance.  Small hyperdense masses in the mediastinum are stable compared with 2015 supporting benign etiology such as ectopic thyroid tissue. Three-vessel prominent coronary artery calcification. Bibasilar bronchiolectasis. Labs 09/20/2017-WBC 14,900, hemoglobin 11.2, eosinophils absent, lymphocytes low( ?steroids or viral?), BNP 220, CMET wnl IgE 04/12/2018- 2,574, 10/16/16- 4,302, 11/09/13- 3,513 Office Spirometry 04/12/2018-mild restriction of exhaled volume.  Possible mild obstructive airways.  FVC 1.9/73%, FEV1 1.4/75%, ratio 1.76, FEF 25-75% 1.2/80% -------------------------------------------------------------------------------------------   06/02/2018- 78 year old female never smoker followed for OSA, allergic rhinitis, asthma/ Nucala, Hyper IgE, complicated by A. Fib/Coumadin, HBP, GERD, obesity CPAP 9/Advanced -----pt presents with sinus congestion, mainly on L side, breathing better, still has "coughing spells"; denies chest tightness/pain; takes Anoro Anoro, Singulair, neb Duoneb/ budesonide, Proair hfa, tessalon 200 Had persistent left maxillary sinus pain for 2 weeks with no bleeding on warfarin.   Ears okay left upper rear teeth sore.  No fever. She feels her asthma control is better on new Calla. She had tolerated amoxicillin without problems despite childhood history of intolerance to penicillin.  Frequent pneumonias as a child. Now pending foot surgery. Asks bariatric counseling for weight loss.  08/04/2018- 78 year old female never smoker followed for OSA, allergic rhinitis, asthma/ Nucala, Hyper IgE, complicated by A. Fib/Coumadin, HBP, GERD, obesity CPAP 9/Advanced- pending recently ordered change to [place] compliance 100%, AHI 2.5/ hr Anoro, Singulair, neb Duoneb/ budesonide, Proair hfa, tessalon 200 Unable to weigh today due to recent foot surgery -----OSA on CPAP, recently switched to [facility], uses every night, states mouth is drying out "really bad sometimes" Had GOT/ foot surgery twice w/o respiratory probs. Missed/ forgot Nucala last month, but feels it helps her.  Increased seasonal rhinitis this spring.  Using neb twice daily. Pending patient assistance decision for help w Anoro.  CPAP overdries, but frequent nocturia. Usess Biotene.   ROS-see HPI   + = positive Constitutional:   No-   weight loss, night sweats, fevers, chills, fatigue, lassitude. HEENT:   No-  headaches, difficulty swallowing, tooth/dental problems, sore throat,       No-  sneezing, itching,  ear ache,  +nasal congestion, post nasal drip,  CV:  No-   chest pain, orthopnea, PND, swelling in lower extremities, anasarca, dizziness, palpitations Resp: +  shortness of breath with exertion or at rest.                productive cough,  + non-productive cough,  No- coughing up of blood.              No-change in color of mucus. + wheezing.   Skin: No-   rash or lesions. GI:  No-   heartburn, indigestion, abdominal pain, nausea, vomiting, GU:  MS:  No-   joint pain or swelling. . Neuro-     nothing unusual Psych:  No- change in  mood or affect. No depression or anxiety.  No  memory loss.  OBJ- Physical Exam General- Alert, Oriented, Affect-appropriate, Distress- none acute, + obese Skin- rash-none, lesions- none, excoriation- none Lymphadenopathy- none Head- atraumatic            Eyes- Gross vision intact, PERRLA, conjunctivae and secretions clear            Ears- Hearing, canals-normal            Nose- +turbinate edema, no-Septal dev, mucus, polyps, erosion, perforation             Throat- Mallampati III-IV , mucosa- not red,  drainage- none, tonsils- atrophic Neck- flexible , trachea midline, no stridor , thyroid nl, carotid no bruit Chest - symmetrical excursion , unlabored           Heart/CV- RR/ very faint (no pacemaker) , no murmur , no gallop  , no rub, nl  s1 s2                  - JVD- none , edema- none, stasis changes- none, varices- none           Lung-  Clear, Wheeze-none , cough-none,                         dullness-none, rub- none           Chest wall-  Abd- Br/ Gen/ Rectal- Not done, not indicated Extrem- cyanosis- none, clubbing, none, atrophy- none, strength- nl, + boot L foot, +rolling walker Neuro- grossly intact to observation

## 2018-08-04 NOTE — Progress Notes (Signed)
Have you been hospitalized within the last 10 days?  No Do you have a fever?  No Do you have a cough?  Yes Allergy cough Do you have a headache or sore throat? No  Pt saw CY, ok to get inj.

## 2018-08-04 NOTE — Patient Instructions (Signed)
We can continue CPAP 9, mask of choice, humidifier, supplies, AirView/ card  Samples x 2 Anoro if available  Please let us know if there are problems with switching your DME company as planned, to [employer]

## 2018-08-11 DIAGNOSIS — I4891 Unspecified atrial fibrillation: Secondary | ICD-10-CM | POA: Diagnosis not present

## 2018-08-11 DIAGNOSIS — Z299 Encounter for prophylactic measures, unspecified: Secondary | ICD-10-CM | POA: Diagnosis not present

## 2018-08-11 DIAGNOSIS — I10 Essential (primary) hypertension: Secondary | ICD-10-CM | POA: Diagnosis not present

## 2018-08-11 DIAGNOSIS — Z6835 Body mass index (BMI) 35.0-35.9, adult: Secondary | ICD-10-CM | POA: Diagnosis not present

## 2018-08-23 ENCOUNTER — Telehealth: Payer: Self-pay | Admitting: Internal Medicine

## 2018-08-23 DIAGNOSIS — M85872 Other specified disorders of bone density and structure, left ankle and foot: Secondary | ICD-10-CM | POA: Diagnosis not present

## 2018-08-23 DIAGNOSIS — M2012 Hallux valgus (acquired), left foot: Secondary | ICD-10-CM | POA: Diagnosis not present

## 2018-08-23 DIAGNOSIS — M2062 Acquired deformities of toe(s), unspecified, left foot: Secondary | ICD-10-CM | POA: Diagnosis not present

## 2018-08-23 DIAGNOSIS — M2142 Flat foot [pes planus] (acquired), left foot: Secondary | ICD-10-CM | POA: Diagnosis not present

## 2018-08-23 NOTE — Telephone Encounter (Signed)
Nucala Order: 100mg  #1 Vial Order Date: 08/23/2018 Expected date of arrival: 08/24/2018 Ordered by: Desmond Dike, [place]: Nigel Mormon

## 2018-08-24 NOTE — Assessment & Plan Note (Signed)
Dyspnea exacerbations can be asthma and CHF, Plan- sample Anoro, waiting for patient assistance

## 2018-08-24 NOTE — Assessment & Plan Note (Signed)
Benefits from CPAP but overdries, together with Rxs for her cardiac problems.  Plan- continue CPAP 9

## 2018-08-24 NOTE — Telephone Encounter (Signed)
Nucala Shipment Received: 100mg  #1 vial Medication arrival date: 08/24/2018 Lot #: [id number] Exp date: 12/2021 Received by: TBS

## 2018-08-25 ENCOUNTER — Other Ambulatory Visit (HOSPITAL_COMMUNITY): Payer: Self-pay | Admitting: *Deleted

## 2018-08-25 MED ORDER — LOSARTAN POTASSIUM 100 MG PO TABS: 100.0000 mg | ORAL_TABLET | Freq: Every day | ORAL | 2 refills | Status: DC

## 2018-08-26 ENCOUNTER — Telehealth: Payer: Self-pay | Admitting: Internal Medicine

## 2018-08-26 NOTE — Telephone Encounter (Signed)
Spoke with patient. She stated that she had contacted [place] in regards to patient assistance for her Anoro. [place] stated that they did not receive the documents that were faxed in April. They are also requesting a copy of the patient's insurance card as well as pharmacy printout stating that she has spent $600 on medications.   Reviewed patient's chart to see if I could pull docs from scan, everything was there except the pharmacy printout. She stated that she would call [facility] to see if they could fax these to [place] since she recently just had surgery and lives about an hour away.   Provided her with our fax number and my name.   Will route to myself for follow up.

## 2018-08-29 DIAGNOSIS — M9905 Segmental and somatic dysfunction of pelvic region: Secondary | ICD-10-CM | POA: Diagnosis not present

## 2018-08-29 DIAGNOSIS — M542 Cervicalgia: Secondary | ICD-10-CM | POA: Diagnosis not present

## 2018-08-29 DIAGNOSIS — M9903 Segmental and somatic dysfunction of lumbar region: Secondary | ICD-10-CM | POA: Diagnosis not present

## 2018-08-29 DIAGNOSIS — M546 Pain in thoracic spine: Secondary | ICD-10-CM | POA: Diagnosis not present

## 2018-08-29 DIAGNOSIS — M545 Low back pain: Secondary | ICD-10-CM | POA: Diagnosis not present

## 2018-08-29 DIAGNOSIS — M9902 Segmental and somatic dysfunction of thoracic region: Secondary | ICD-10-CM | POA: Diagnosis not present

## 2018-08-29 DIAGNOSIS — M9901 Segmental and somatic dysfunction of cervical region: Secondary | ICD-10-CM | POA: Diagnosis not present

## 2018-08-29 NOTE — Telephone Encounter (Signed)
LVM for patient to call back, in need of copy of her insurance card and out of pocket expenses sent to [place] for review. LVM for [facility] to see if fax has been sent to our office for review. X1

## 2018-08-29 NOTE — Telephone Encounter (Signed)
Patient is returning phone call.  Patient phone number is [phone].  Patient states pharmacy sent the wrong report.  They are faxing the correct report.  Faxing report to [employer].

## 2018-08-30 NOTE — Telephone Encounter (Signed)
P'ts insurance card is in her chart.  I will be happy to print out a copy if you are unable to locate her insurance card.

## 2018-08-30 NOTE — Telephone Encounter (Signed)
Paperwork was faxed over this afternoon. Will leave this encounter open and in my inbox for follow up.

## 2018-08-30 NOTE — Telephone Encounter (Signed)
[name] [name], is anything else need on this matter for [name] [name] paperwork.  Please advise

## 2018-08-30 NOTE — Telephone Encounter (Signed)
Received fax from [place]. Unfortunately the print out only showed that the patient spent $98 out of pocket. [employer] requires $600. Will see if [employer] will process the information for her since she has been waiting for quite some time.

## 2018-08-31 NOTE — Telephone Encounter (Signed)
Spoke with patient. Advised her that I would follow up with [place] on Friday and relay any information to her. She verbalized understanding.   Nothing further needed at time of call.

## 2018-09-01 ENCOUNTER — Ambulatory Visit (INDEPENDENT_AMBULATORY_CARE_PROVIDER_SITE_OTHER): Payer: Medicare Other

## 2018-09-01 ENCOUNTER — Other Ambulatory Visit: Payer: Self-pay

## 2018-09-01 DIAGNOSIS — J4550 Severe persistent asthma, uncomplicated: Secondary | ICD-10-CM | POA: Diagnosis not present

## 2018-09-01 MED ORDER — MEPOLIZUMAB 100 MG ~~LOC~~ SOLR
100.0000 mg | Freq: Once | SUBCUTANEOUS | Status: AC
  Administered 2018-09-01: 100 mg via SUBCUTANEOUS

## 2018-09-01 NOTE — Progress Notes (Signed)
Have you been hospitalized within the last 10 days?  No Do you have a fever?  No Do you have a cough?  Yes  Dry allergy cough Do you have a headache or sore throat? No

## 2018-09-02 ENCOUNTER — Encounter: Payer: Medicare Other | Admitting: Internal Medicine

## 2018-09-02 ENCOUNTER — Telehealth: Payer: Self-pay

## 2018-09-02 DIAGNOSIS — M9905 Segmental and somatic dysfunction of pelvic region: Secondary | ICD-10-CM | POA: Diagnosis not present

## 2018-09-02 DIAGNOSIS — M9902 Segmental and somatic dysfunction of thoracic region: Secondary | ICD-10-CM | POA: Diagnosis not present

## 2018-09-02 DIAGNOSIS — M9903 Segmental and somatic dysfunction of lumbar region: Secondary | ICD-10-CM | POA: Diagnosis not present

## 2018-09-02 DIAGNOSIS — M542 Cervicalgia: Secondary | ICD-10-CM | POA: Diagnosis not present

## 2018-09-02 DIAGNOSIS — M546 Pain in thoracic spine: Secondary | ICD-10-CM | POA: Diagnosis not present

## 2018-09-02 DIAGNOSIS — M545 Low back pain: Secondary | ICD-10-CM | POA: Diagnosis not present

## 2018-09-02 DIAGNOSIS — M9901 Segmental and somatic dysfunction of cervical region: Secondary | ICD-10-CM | POA: Diagnosis not present

## 2018-09-07 ENCOUNTER — Telehealth: Payer: Self-pay | Admitting: Internal Medicine

## 2018-09-07 ENCOUNTER — Telehealth: Payer: Self-pay | Admitting: *Deleted

## 2018-09-07 NOTE — Telephone Encounter (Signed)
Pt calling to f/u on [employer] application pt assistance Anoro. She states [employer] is saying they have not received proof of $600. She has had her pharmacy to refax document.

## 2018-09-07 NOTE — Telephone Encounter (Signed)
I received a fax confirmation last week stating that the paperwork had gone through successfully. I will refax paperwork today from a different fax machine. Will call the patient once I receive the confirmation from fax.

## 2018-09-07 NOTE — Telephone Encounter (Addendum)
Medications and allergies reviewed with patient  BP 159/85  HR 60 and Wt 186 Unable to do video visit-no camera on computer or cell phone-request phone call Patient verbally consented for telehealth visits with [facility] and understands that her insurance company will be billed for the encounter.

## 2018-09-07 NOTE — Telephone Encounter (Signed)
Pt states she just want [place] to be in the loop. [employer] is saying they did not receive any documents faxed 6/2. She spoke with [facility] [place] who gave direct ext # to f/u on. She wanted to verify fax on application  Pt will go thru [facility] to have drug expense sent to [place]. She wants to f/u on this herself since it has taken so long.

## 2018-09-08 ENCOUNTER — Telehealth: Payer: Self-pay | Admitting: *Deleted

## 2018-09-08 NOTE — Telephone Encounter (Signed)
Spoke with pt, she states [employer] called her and stated she was approved for Anoro. They are going to mail her 90 days at a time for a year. She wanted to let CY know and to thank [place] for helping her with this matter. Nothing further is needed.

## 2018-09-08 NOTE — Telephone Encounter (Signed)
Pt states that she received call stating that she has been approved for Anoro. Cb is [phone]

## 2018-09-08 NOTE — Telephone Encounter (Signed)
Cherina - please advise if this paperwork was refaxed. Thanks.

## 2018-09-08 NOTE — Telephone Encounter (Signed)
Contacted office to say that she is also taking hydralazine 25 mg daily. Medication list updated.

## 2018-09-09 ENCOUNTER — Telehealth (INDEPENDENT_AMBULATORY_CARE_PROVIDER_SITE_OTHER): Payer: Medicare Other | Admitting: Internal Medicine

## 2018-09-09 VITALS — BP 153/79

## 2018-09-09 DIAGNOSIS — I4819 Other persistent atrial fibrillation: Secondary | ICD-10-CM | POA: Diagnosis not present

## 2018-09-09 DIAGNOSIS — I10 Essential (primary) hypertension: Secondary | ICD-10-CM | POA: Diagnosis not present

## 2018-09-09 NOTE — Progress Notes (Signed)
Electrophysiology TeleHealth Note   Due to national recommendations of social distancing due to [place] 19, an audio telehealth visit is felt to be most appropriate for this patient at this time.  Verbal consent was obtained by me for the telehealth visit today.  The patient does not have capability for a virtual visit.  A phone visit is therefore required today.   Date:  09/09/2018   ID:  [name] [name], DOB 06/30/1940, MRN [id number]  Location: patient's home  Provider location:  [facility]  Evaluation Performed: Follow-up visit  PCP:  Monico Blitz, MD   Electrophysiologist:  Dr Rayann Heman  Chief Complaint:  AF follow up  History of Present Illness:    [name] [name] is a 78 y.o. female who presents via telehealth conferencing today.  Since last being seen in our clinic, the patient reports doing very well.  Today, she denies symptoms of palpitations, chest pain, shortness of breath,  lower extremity edema, dizziness, presyncope, or syncope.  The patient is otherwise without complaint today.  The patient denies symptoms of fevers, chills, cough, or new SOB worrisome for COVID 19.  Past Medical History:  Diagnosis Date  . Allergic rhinitis   . Anxiety   . Asthma   . Atrial flutter ([place])   . COPD (chronic obstructive pulmonary disease) ([place])   . Depression   . Essential hypertension   . Hyperlipidemia   . Lumbar disc disease   . Obesity   . OSA (obstructive sleep apnea)    CPAP  . Paroxysmal atrial fibrillation (H[facility])    Element of tachycardia bradycardia syndrome  . Respiratory failure [facility])     Past Surgical History:  Procedure Laterality Date  . ANTERIOR FUSION LUMBAR SPINE    . BIOPSY  08/27/2016   Procedure: BIOPSY;  Surgeon: Rogene Houston, MD;  Location: [facility] ENDO SU[facility];  Service: Endoscopy;;  FOUR GASTRIC POLYPS BIOPSIED  . CARDIOVERSION N/A 03/28/2014   Procedure: CARDIOVERSION;  Surgeon: Fay Records, MD;  Location: [facility] ORS;  Service: Cardiovascular;   Laterality: N/A;  . CARDIOVERSION N/A 10/22/2016   Procedure: CARDIOVERSION;  Surgeon: Sueanne Margarita, MD;  Location: [facility] ENDOSCOPY;  Service: Cardiovascular;  Laterality: N/A;  . CATARACT EXTRACTION    . COLONOSCOPY N/A 08/04/2012   Procedure: COLONOSCOPY;  Surgeon: Rogene Houston, MD;  Location: [facility] ENDO [facility];  Service: Endoscopy;  Laterality: N/A;  730-rescheduled to [place] notified pt  . ELECTROPHYSIOLOGIC STUDY N/A 09/18/2014   Procedure: Atrial Fibrillation Ablation;  Surgeon: Thompson Grayer, MD;  Location: [place] CV LAB;  Service: Cardiovascular;  Laterality: N/A;  . ESOPHAGOGASTRODUODENOSCOPY N/A 08/27/2016   Procedure: ESOPHAGOGASTRODUODENOSCOPY (EGD);  Surgeon: Rogene Houston, MD;  Location: [facility];  Service: Endoscopy;  Laterality: N/A;  1200  . LASIK     Both eyes  . TEE WITHOUT CARDIOVERSION N/A 09/18/2014   Procedure: TRANSESOPHAGEAL ECHOCARDIOGRAM (TEE);  Surgeon: Larey Dresser, MD;  Location: [place];  Service: Cardiovascular;  Laterality: N/A;  . TEE WITHOUT CARDIOVERSION N/A 10/22/2016   Procedure: TRANSESOPHAGEAL ECHOCARDIOGRAM (TEE);  Surgeon: Sueanne Margarita, MD;  Location: [facility] ENDOSCOPY;  Service: Cardiovascular;  Laterality: N/A;  . TEE WITHOUT CARDIOVERSION N/A 08/24/2017   Procedure: TRANSESOPHAGEAL ECHOCARDIOGRAM (TEE);  Surgeon: Josue Hector, MD;  Location: [facility] ENDOSCOPY;  Service: Cardiovascular;  Laterality: N/A;  . TOTAL HIP ARTHROPLASTY  2010   Right    Current Outpatient Medications  Medication Sig Dispense Refill  . acetaminophen (TYLENOL) 500  MG tablet Take 500 mg by mouth daily as needed for headache.    . albuterol (PROAIR HFA) 108 (90 Base) MCG/ACT inhaler 2 puffs every 6 hours for wheezing as needed 1 Inhaler 12  . antiseptic oral rinse (BIOTENE) LIQD 15 mLs by Mouth Rinse route as needed for dry mouth.    Marland Kitchen atorvastatin (LIPITOR) 10 MG tablet Take 10 mg by mouth every evening.     . baclofen (LIORESAL) 10 MG tablet Take 10 mg by mouth  2 (two) times daily as needed for muscle spasms.     . benzonatate (TESSALON) 200 MG capsule Take 1 capsule (200 mg total) by mouth 3 (three) times daily as needed for cough. 30 capsule 3  . Biotin 1 MG CAPS Take 1,000 mg by mouth daily.    . budesonide (PULMICORT) 0.5 MG/2ML nebulizer solution Take 0.5 mg by nebulization 2 (two) times daily.     . Calcium Carbonate-Vit D-Min (QC CALCIUM-MAGNESIUM-ZINC-D3) 333.4-133 MG-UNIT TABS Take 1 tablet by mouth 2 (two) times daily.     . cetirizine (ZYRTEC) 10 MG tablet Take 10 mg by mouth daily as needed for allergies.    . clindamycin (CLEOCIN) 300 MG capsule 2 capsules as needed. Take prior to dental procedures  0  . Dextromethorphan-guaiFENesin (MUCINEX DM) 30-600 MG TB12 Take 1-2 tablets by mouth every 12 (twelve) hours as needed.    . diltiazem (CARDIZEM) 30 MG tablet Take 30 mg by mouth as needed (for palpitations).    Marland Kitchen diltiazem (CARTIA XT) 120 MG 24 hr capsule Take 1 capsule (120 mg total) by mouth at bedtime. 90 capsule 2  . dofetilide (TIKOSYN) 500 MCG capsule Take 1 capsule (500 mcg total) by mouth 2 (two) times daily. 180 capsule 1  . famotidine (PEPCID) 20 MG tablet Take 20 mg by mouth daily.    . fluticasone (FLONASE) 50 MCG/ACT nasal spray Place 1 spray into both nostrils 2 (two) times daily.    . hydrALAZINE (APRESOLINE) 25 MG tablet Take 25 mg by mouth daily.    Marland Kitchen HYDROcodone-homatropine (HYDROMET) 5-1.5 MG/5ML syrup Take 5 mLs by mouth every 6 (six) hours as needed for cough. 120 mL 0  . ipratropium-albuterol (DUONEB) 0.5-2.5 (3) MG/3ML SOLN Take 3 mLs by nebulization every 6 (six) hours as needed.     Marland Kitchen losartan (COZAAR) 100 MG tablet Take 1 tablet (100 mg total) by mouth daily. 90 tablet 2  . Magnesium 250 MG TABS Take 250 mg by mouth daily.    . montelukast (SINGULAIR) 10 MG tablet Take 10 mg by mouth at bedtime.    . pantoprazole (PROTONIX) 40 MG tablet Take 40 mg by mouth daily.    . potassium chloride SA (K-DUR,KLOR-CON) 20 MEQ  tablet Take 1 tablet (20 mEq total) by mouth daily. 90 tablet 2  . sodium chloride (OCEAN) 0.65 % SOLN nasal spray Place 1 spray into both nostrils 2 (two) times daily.     Marland Kitchen triamcinolone ointment (KENALOG) 0.1 % Apply 1 application topically daily as needed for rash.  2  . umeclidinium-vilanterol (ANORO ELLIPTA) 62.5-25 MCG/INH AEPB Inhale 1 puff into the lungs daily. 3 each 0  . WARFARIN SODIUM PO Take by mouth as directed. Patient is taking 5 mg once da ily, 2.5 mg on Monday & Friday.    . Wheat Dextrin (BENEFIBER ON THE GO) POWD Take 1 Dose by mouth daily as needed (constipation). 2 tsp daily in the morning.     No current facility-administered medications for  this visit.     Allergies:   Bupropion hcl, Escitalopram oxalate, Fluticasone-salmeterol, Lisinopril, Serevent, Macrodantin [nitrofurantoin], and Penicillins   Social History:  The patient  reports that she has never smoked. She has never used smokeless tobacco. She reports that she does not drink alcohol or use drugs.   Family History:  The patient's  family history includes Cancer in her mother; Colon cancer in an other family member; Heart attack in her father.   ROS:  Please see the history of present illness.   All other systems are personally reviewed and negative.    Exam:    Vital Signs:  BP (!) 153/79   Well sounding today   Labs/Other Tests and Data Reviewed:    Recent Labs: 09/20/2017: ALT 16; Pro B Natriuretic peptide (BNP) 220.0 12/28/2017: BUN 22; Creatinine, Ser 0.85; Magnesium 2.0; Potassium 3.9; Sodium 138 06/02/2018: Hemoglobin 11.3; Platelets 167.0   Wt Readings from Last 3 Encounters:  06/10/18 195 lb (88.5 kg)  06/02/18 199 lb 9.6 oz (90.5 kg)  04/26/18 194 lb 3.2 oz (88.1 kg)       ASSESSMENT & PLAN:    1.  Persistent atrial fibrillation/ atrial flutter Doing well on Tikosyn Will need to update labs  2.  HTN Stable No change required today  3.  Obesity Lifestyle modification encouraged   4.  COPD/SOB Followed by pulmonary Stable No change required today    Follow-up:  6 weeks in AF clinic for labs and EKG    Patient Risk:  after full review of this patients clinical status, I feel that they are at moderate risk at this time.  Today, I have spent 15 minutes with the patient with telehealth technology discussing arrhythmia management .    Army Fossa, MD  09/09/2018 9:08 AM     [facility] [address] [place] [address] [phone] (office) [phone] (fax)

## 2018-09-13 DIAGNOSIS — I4891 Unspecified atrial fibrillation: Secondary | ICD-10-CM | POA: Diagnosis not present

## 2018-09-13 DIAGNOSIS — I10 Essential (primary) hypertension: Secondary | ICD-10-CM | POA: Diagnosis not present

## 2018-09-13 DIAGNOSIS — Z6834 Body mass index (BMI) 34.0-34.9, adult: Secondary | ICD-10-CM | POA: Diagnosis not present

## 2018-09-13 DIAGNOSIS — Z713 Dietary counseling and surveillance: Secondary | ICD-10-CM | POA: Diagnosis not present

## 2018-09-13 DIAGNOSIS — Z299 Encounter for prophylactic measures, unspecified: Secondary | ICD-10-CM | POA: Diagnosis not present

## 2018-09-19 ENCOUNTER — Telehealth: Payer: Self-pay | Admitting: Internal Medicine

## 2018-09-19 DIAGNOSIS — M9901 Segmental and somatic dysfunction of cervical region: Secondary | ICD-10-CM | POA: Diagnosis not present

## 2018-09-19 DIAGNOSIS — M9902 Segmental and somatic dysfunction of thoracic region: Secondary | ICD-10-CM | POA: Diagnosis not present

## 2018-09-19 DIAGNOSIS — M542 Cervicalgia: Secondary | ICD-10-CM | POA: Diagnosis not present

## 2018-09-19 DIAGNOSIS — M9903 Segmental and somatic dysfunction of lumbar region: Secondary | ICD-10-CM | POA: Diagnosis not present

## 2018-09-19 DIAGNOSIS — M9905 Segmental and somatic dysfunction of pelvic region: Secondary | ICD-10-CM | POA: Diagnosis not present

## 2018-09-19 DIAGNOSIS — M545 Low back pain: Secondary | ICD-10-CM | POA: Diagnosis not present

## 2018-09-19 DIAGNOSIS — M546 Pain in thoracic spine: Secondary | ICD-10-CM | POA: Diagnosis not present

## 2018-09-19 MED ORDER — ANORO ELLIPTA 62.5-25 MCG/INH IN AEPB: 1.0000 | INHALATION_SPRAY | Freq: Every day | RESPIRATORY_TRACT | 0 refills | Status: DC

## 2018-09-19 NOTE — Telephone Encounter (Signed)
Nucala Order: 100mg  #1 Vial Order Date: 09/19/2018 Expected date of arrival: 09/20/2018 Ordered by: Desmond Dike, [place]: Nigel Mormon

## 2018-09-19 NOTE — Telephone Encounter (Signed)
Called and spoke with pt letting her know that I was going to place samples of Anoro up front at main entrance for her to come and pick up and pt verbalized understanding. Nothing further needed.

## 2018-09-20 NOTE — Telephone Encounter (Signed)
Nucala Shipment Received: 100mg  #1 vial Medication arrival date: 09/20/2018 Lot #: [id number] Exp date: 12/2021 Received by: TBS

## 2018-09-21 DIAGNOSIS — L299 Pruritus, unspecified: Secondary | ICD-10-CM | POA: Diagnosis not present

## 2018-09-21 DIAGNOSIS — I10 Essential (primary) hypertension: Secondary | ICD-10-CM | POA: Diagnosis not present

## 2018-09-21 DIAGNOSIS — I4891 Unspecified atrial fibrillation: Secondary | ICD-10-CM | POA: Diagnosis not present

## 2018-09-21 DIAGNOSIS — J449 Chronic obstructive pulmonary disease, unspecified: Secondary | ICD-10-CM | POA: Diagnosis not present

## 2018-09-21 DIAGNOSIS — Z299 Encounter for prophylactic measures, unspecified: Secondary | ICD-10-CM | POA: Diagnosis not present

## 2018-09-21 DIAGNOSIS — Z6834 Body mass index (BMI) 34.0-34.9, adult: Secondary | ICD-10-CM | POA: Diagnosis not present

## 2018-09-22 DIAGNOSIS — L03032 Cellulitis of left toe: Secondary | ICD-10-CM | POA: Diagnosis not present

## 2018-09-22 DIAGNOSIS — M19072 Primary osteoarthritis, left ankle and foot: Secondary | ICD-10-CM | POA: Diagnosis not present

## 2018-09-22 DIAGNOSIS — M2012 Hallux valgus (acquired), left foot: Secondary | ICD-10-CM | POA: Diagnosis not present

## 2018-09-22 DIAGNOSIS — M2142 Flat foot [pes planus] (acquired), left foot: Secondary | ICD-10-CM | POA: Diagnosis not present

## 2018-09-22 DIAGNOSIS — Z9889 Other specified postprocedural states: Secondary | ICD-10-CM | POA: Diagnosis not present

## 2018-09-22 DIAGNOSIS — M2062 Acquired deformities of toe(s), unspecified, left foot: Secondary | ICD-10-CM | POA: Diagnosis not present

## 2018-09-22 DIAGNOSIS — M8580 Other specified disorders of bone density and structure, unspecified site: Secondary | ICD-10-CM | POA: Diagnosis not present

## 2018-09-29 ENCOUNTER — Other Ambulatory Visit: Payer: Self-pay

## 2018-09-29 ENCOUNTER — Ambulatory Visit (INDEPENDENT_AMBULATORY_CARE_PROVIDER_SITE_OTHER): Payer: Medicare Other

## 2018-09-29 DIAGNOSIS — J4550 Severe persistent asthma, uncomplicated: Secondary | ICD-10-CM | POA: Diagnosis not present

## 2018-09-29 MED ORDER — MEPOLIZUMAB 100 MG ~~LOC~~ SOLR
100.0000 mg | Freq: Once | SUBCUTANEOUS | Status: AC
  Administered 2018-09-29: 11:00:00 100 mg via SUBCUTANEOUS

## 2018-09-29 NOTE — Progress Notes (Signed)
Have you been hospitalized within the last 10 days?  No Do you have a fever?  No Do you have a cough?  No Do you have a headache or sore throat? No  

## 2018-10-04 ENCOUNTER — Ambulatory Visit: Payer: Medicare Other | Admitting: Internal Medicine

## 2018-10-14 DIAGNOSIS — Z713 Dietary counseling and surveillance: Secondary | ICD-10-CM | POA: Diagnosis not present

## 2018-10-14 DIAGNOSIS — Z6834 Body mass index (BMI) 34.0-34.9, adult: Secondary | ICD-10-CM | POA: Diagnosis not present

## 2018-10-14 DIAGNOSIS — Z299 Encounter for prophylactic measures, unspecified: Secondary | ICD-10-CM | POA: Diagnosis not present

## 2018-10-14 DIAGNOSIS — I4891 Unspecified atrial fibrillation: Secondary | ICD-10-CM | POA: Diagnosis not present

## 2018-10-14 DIAGNOSIS — I10 Essential (primary) hypertension: Secondary | ICD-10-CM | POA: Diagnosis not present

## 2018-10-17 DIAGNOSIS — B351 Tinea unguium: Secondary | ICD-10-CM | POA: Diagnosis not present

## 2018-10-17 DIAGNOSIS — M2012 Hallux valgus (acquired), left foot: Secondary | ICD-10-CM | POA: Diagnosis not present

## 2018-10-17 DIAGNOSIS — L03032 Cellulitis of left toe: Secondary | ICD-10-CM | POA: Diagnosis not present

## 2018-10-18 DIAGNOSIS — Z6834 Body mass index (BMI) 34.0-34.9, adult: Secondary | ICD-10-CM | POA: Diagnosis not present

## 2018-10-18 DIAGNOSIS — Z299 Encounter for prophylactic measures, unspecified: Secondary | ICD-10-CM | POA: Diagnosis not present

## 2018-10-18 DIAGNOSIS — J449 Chronic obstructive pulmonary disease, unspecified: Secondary | ICD-10-CM | POA: Diagnosis not present

## 2018-10-18 DIAGNOSIS — N390 Urinary tract infection, site not specified: Secondary | ICD-10-CM | POA: Diagnosis not present

## 2018-10-18 DIAGNOSIS — G4730 Sleep apnea, unspecified: Secondary | ICD-10-CM | POA: Diagnosis not present

## 2018-10-18 DIAGNOSIS — R350 Frequency of micturition: Secondary | ICD-10-CM | POA: Diagnosis not present

## 2018-10-24 ENCOUNTER — Ambulatory Visit (HOSPITAL_COMMUNITY)
Admission: RE | Admit: 2018-10-24 | Discharge: 2018-10-24 | Disposition: A | Discharge status: Home / Self Care | Payer: Medicare Other | Source: Ambulatory Visit | Attending: Nurse Practitioner | Admitting: Nurse Practitioner

## 2018-10-24 ENCOUNTER — Encounter (HOSPITAL_COMMUNITY): Payer: Self-pay | Admitting: Nurse Practitioner

## 2018-10-24 ENCOUNTER — Other Ambulatory Visit: Payer: Self-pay

## 2018-10-24 VITALS — BP 136/64 | HR 67 | Ht 63.0 in | Wt 187.0 lb

## 2018-10-24 DIAGNOSIS — I4819 Other persistent atrial fibrillation: Secondary | ICD-10-CM | POA: Insufficient documentation

## 2018-10-24 DIAGNOSIS — I10 Essential (primary) hypertension: Secondary | ICD-10-CM | POA: Insufficient documentation

## 2018-10-24 DIAGNOSIS — J449 Chronic obstructive pulmonary disease, unspecified: Secondary | ICD-10-CM | POA: Insufficient documentation

## 2018-10-24 DIAGNOSIS — G4733 Obstructive sleep apnea (adult) (pediatric): Secondary | ICD-10-CM | POA: Diagnosis not present

## 2018-10-24 DIAGNOSIS — Z7901 Long term (current) use of anticoagulants: Secondary | ICD-10-CM | POA: Insufficient documentation

## 2018-10-24 DIAGNOSIS — Z96641 Presence of right artificial hip joint: Secondary | ICD-10-CM | POA: Insufficient documentation

## 2018-10-24 DIAGNOSIS — Z7951 Long term (current) use of inhaled steroids: Secondary | ICD-10-CM | POA: Diagnosis not present

## 2018-10-24 DIAGNOSIS — Z79899 Other long term (current) drug therapy: Secondary | ICD-10-CM | POA: Insufficient documentation

## 2018-10-24 DIAGNOSIS — Z8249 Family history of ischemic heart disease and other diseases of the circulatory system: Secondary | ICD-10-CM | POA: Insufficient documentation

## 2018-10-24 DIAGNOSIS — Z883 Allergy status to other anti-infective agents status: Secondary | ICD-10-CM | POA: Diagnosis not present

## 2018-10-24 DIAGNOSIS — E785 Hyperlipidemia, unspecified: Secondary | ICD-10-CM | POA: Insufficient documentation

## 2018-10-24 DIAGNOSIS — Z981 Arthrodesis status: Secondary | ICD-10-CM | POA: Insufficient documentation

## 2018-10-24 DIAGNOSIS — Z880 Allergy status to penicillin: Secondary | ICD-10-CM | POA: Diagnosis not present

## 2018-10-24 DIAGNOSIS — Z888 Allergy status to other drugs, medicaments and biological substances status: Secondary | ICD-10-CM | POA: Diagnosis not present

## 2018-10-24 LAB — BASIC METABOLIC PANEL
Anion gap: 9 (ref 5–15)
BUN: 14 mg/dL (ref 8–23)
CO2: 21 mmol/L — ABNORMAL LOW (ref 22–32)
Calcium: 9.3 mg/dL (ref 8.9–10.3)
Chloride: 110 mmol/L (ref 98–111)
Creatinine, Ser: 1.19 mg/dL — ABNORMAL HIGH (ref 0.44–1.00)
GFR calc Af Amer: 50 mL/min — ABNORMAL LOW (ref >60)
GFR calc non Af Amer: 43 mL/min — ABNORMAL LOW (ref >60)
Glucose, Bld: 115 mg/dL — ABNORMAL HIGH (ref 70–99)
Potassium: 3.8 mmol/L (ref 3.5–5.1)
Sodium: 140 mmol/L (ref 135–145)

## 2018-10-24 LAB — MAGNESIUM: Magnesium: 2 mg/dL (ref 1.7–2.4)

## 2018-10-24 MED ORDER — DILTIAZEM HCL 30 MG PO TABS: 30.0000 mg | ORAL_TABLET | ORAL | 2 refills | Status: DC | PRN

## 2018-10-24 NOTE — Progress Notes (Signed)
Primary Care Physician: Monico Blitz, MD Referring Physician: Dr. Haynes [name] [name] is a 78 y.o. female with a h/o COPD, HTN, OSA, atrial flutter and paroxysmal afib tht has been persistent for several weeks. She was seen  the afib clinic for admission for Tikosyn. She failed flecainide, many cardioversion's in the past.  She is no longer on HCTZ.  F/u in afib clinic one week after Tikosyn loading. She is Sinus brady today at HR of 49 bpm, but at home HR ins in the 50's  Which is her norm. She feels improved on tikosyn being  in rhythm.  F/u in afib clinic 8/21. She had a few off days lat week and wondered if she was in afib. The only change in her health is that she started Trilogy inhaler for her asthma and had to be treated for thrush, She has now stopped inhaler. She is in rhythm today. She has started Pulmonology rehab and is pleased that she is having more energy and is able to do activities that she was able to do before.  F/u in afib clinic 7/27 for Tikosyn surveillance. Her Ekg shows SR with qtc per EKG read out at 532 ms. I asked Dr. Rayann Heman to look at EKG and he feels qtc is closer to  500 ms and does not recommend any change int Tikosyn dose. She has had some recent afib, one night as her power went out and she got hot. She currently has has a UTI and is on Macrodantin . This as well may have stirred up some afib recently.  Today, she denies symptoms of palpitations, chest pain, shortness of breath, orthopnea, PND, lower extremity edema, dizziness, presyncope, syncope, or neurologic sequela. The patient is tolerating medications without difficulties and is otherwise without complaint today.   Past Medical History:  Diagnosis Date   Allergic rhinitis    Anxiety    Asthma    Atrial flutter (HCC)    COPD (chronic obstructive pulmonary disease) ([place])    Depression    Essential hypertension    Hyperlipidemia    Lumbar disc disease    Obesity    OSA  (obstructive sleep apnea)    CPAP   Paroxysmal atrial fibrillation (HCC)    Element of tachycardia bradycardia syndrome   Respiratory failure ([name] [name])    Past Surgical History:  Procedure Laterality Date   ANTERIOR FUSION LUMBAR SPINE     BIOPSY  08/27/2016   Procedure: BIOPSY;  Surgeon: Rogene Houston, MD;  [facility];  Service: Endoscopy;;  FOUR GASTRIC POLYPS BIOPSIED   CARDIOVERSION N/A 03/28/2014   Procedure: CARDIOVERSION;  Surgeon: Fay Records, MD;  Location: [facility];  Service: Cardiovascular;  Laterality: N/A;   CARDIOVERSION N/A 10/22/2016   Procedure: CARDIOVERSION;  Surgeon: Sueanne Margarita, MD;  Location: [facility];  Service: Cardiovascular;  Laterality: N/A;   CATARACT EXTRACTION     COLONOSCOPY N/A 08/04/2012   Procedure: COLONOSCOPY;  Surgeon: Rogene Houston, MD;  [facility];  Service: Endoscopy;  Laterality: N/A;  730-rescheduled to [place] notified pt   ELECTROPHYSIOLOGIC STUDY N/A 09/18/2014   Procedure: Atrial Fibrillation Ablation;  Surgeon: Thompson Grayer, MD;  Location: [facility] CV LAB;  Service: Cardiovascular;  Laterality: N/A;   ESOPHAGOGASTRODUODENOSCOPY N/A 08/27/2016   Procedure: ESOPHAGOGASTRODUODENOSCOPY (EGD);  Surgeon: Rogene Houston, MD;  [facility];  Service: Endoscopy;  Laterality: N/A;  1200   LASIK     Both  eyes   TEE WITHOUT CARDIOVERSION N/A 09/18/2014   Procedure: TRANSESOPHAGEAL ECHOCARDIOGRAM (TEE);  Surgeon: Larey Dresser, MD;  Location: [facility];  Service: Cardiovascular;  Laterality: N/A;   TEE WITHOUT CARDIOVERSION N/A 10/22/2016   Procedure: TRANSESOPHAGEAL ECHOCARDIOGRAM (TEE);  Surgeon: Sueanne Margarita, MD;  Location: [facility] ENDOSCOPY;  Service: Cardiovascular;  Laterality: N/A;   TEE WITHOUT CARDIOVERSION N/A 08/24/2017   Procedure: TRANSESOPHAGEAL ECHOCARDIOGRAM (TEE);  Surgeon: Josue Hector, MD;  Location: [facility] ENDOSCOPY;  Service: Cardiovascular;  Laterality: N/A;   TOTAL HIP  ARTHROPLASTY  2010   Right    Current Outpatient Medications  Medication Sig Dispense Refill   acetaminophen (TYLENOL) 500 MG tablet Take 500 mg by mouth daily as needed for headache.     albuterol (PROAIR HFA) 108 (90 Base) MCG/ACT inhaler 2 puffs every 6 hours for wheezing as needed 1 Inhaler 12   antiseptic oral rinse (BIOTENE) LIQD 15 mLs by Mouth Rinse route as needed for dry mouth.     atorvastatin (LIPITOR) 10 MG tablet Take 10 mg by mouth every evening.      benzonatate (TESSALON) 200 MG capsule Take 1 capsule (200 mg total) by mouth 3 (three) times daily as needed for cough. 30 capsule 3   Biotin 1 MG CAPS Take 1,000 mg by mouth daily.     budesonide (PULMICORT) 0.5 MG/2ML nebulizer solution Take 0.5 mg by nebulization 2 (two) times daily.      Calcium Carbonate-Vit D-Min (QC CALCIUM-MAGNESIUM-ZINC-D3) 333.4-133 MG-UNIT TABS Take 1 tablet by mouth 2 (two) times daily.      cetirizine (ZYRTEC) 10 MG tablet Take 10 mg by mouth daily as needed for allergies.     diltiazem (CARDIZEM) 30 MG tablet Take 1 tablet (30 mg total) by mouth as needed (for palpitations). 45 tablet 2   diltiazem (CARTIA XT) 120 MG 24 hr capsule Take 1 capsule (120 mg total) by mouth at bedtime. 90 capsule 2   dofetilide (TIKOSYN) 500 MCG capsule Take 1 capsule (500 mcg total) by mouth 2 (two) times daily. 180 capsule 1   famotidine (PEPCID) 20 MG tablet Take 20 mg by mouth daily.     fluticasone (FLONASE) 50 MCG/ACT nasal spray Place 1 spray into both nostrils 2 (two) times daily.     hydrALAZINE (APRESOLINE) 25 MG tablet Take 25 mg by mouth daily.     ipratropium-albuterol (DUONEB) 0.5-2.5 (3) MG/3ML SOLN Take 3 mLs by nebulization every 6 (six) hours as needed.      losartan (COZAAR) 100 MG tablet Take 1 tablet (100 mg total) by mouth daily. 90 tablet 2   Magnesium 250 MG TABS Take 250 mg by mouth daily.     montelukast (SINGULAIR) 10 MG tablet Take 10 mg by mouth at bedtime.      pantoprazole (PROTONIX) 40 MG tablet Take 40 mg by mouth daily.     potassium chloride SA (K-DUR,KLOR-CON) 20 MEQ tablet Take 1 tablet (20 mEq total) by mouth daily. 90 tablet 2   sodium chloride (OCEAN) 0.65 % SOLN nasal spray Place 1 spray into both nostrils 2 (two) times daily.      triamcinolone ointment (KENALOG) 0.1 % Apply 1 application topically daily as needed for rash.  2   umeclidinium-vilanterol (ANORO ELLIPTA) 62.5-25 MCG/INH AEPB Inhale 1 puff into the lungs daily. 1 each 0   WARFARIN SODIUM PO Take by mouth as directed. Patient is taking 5 mg once da ily, 2.5 mg on Monday & Friday.  Wheat Dextrin (BENEFIBER ON THE GO) POWD Take 1 Dose by mouth daily as needed (constipation). 2 tsp daily in the morning.     baclofen (LIORESAL) 10 MG tablet Take 10 mg by mouth 2 (two) times daily as needed for muscle spasms.      clindamycin (CLEOCIN) 300 MG capsule 2 capsules as needed. Take prior to dental procedures  0   Dextromethorphan-guaiFENesin (MUCINEX DM) 30-600 MG TB12 Take 1-2 tablets by mouth every 12 (twelve) hours as needed.     HYDROcodone-homatropine (HYDROMET) 5-1.5 MG/5ML syrup Take 5 mLs by mouth every 6 (six) hours as needed for cough. (Patient not taking: Reported on 10/24/2018) 120 mL 0   nitrofurantoin, macrocrystal-monohydrate, (MACROBID) 100 MG capsule Take 100 mg by mouth 2 (two) times daily.     No current facility-administered medications for this encounter.     Allergies  Allergen Reactions   Bupropion Hcl Other (See Comments)    Suicidal Thoughts.    Escitalopram Oxalate Other (See Comments)    Suicidal Thoughts.    Fluticasone-Salmeterol Other (See Comments)    Advair - Caused patient to go into Afib.    Lisinopril Cough   Serevent Other (See Comments)    Caused patient to go into Afib   Macrodantin [Nitrofurantoin] Rash   Penicillins Rash and Other (See Comments)    Childhood allergy  Has patient had a PCN reaction causing immediate rash,  facial/tongue/throat swelling, SOB or lightheadedness with hypotension: Unknown Has patient had a PCN reaction causing severe rash involving mucus membranes or skin necrosis: Unknown Has patient had a PCN reaction that required hospitalization No Has patient had a PCN reaction occurring within the last 10 years: No If all of the above answers are "NO", then may proceed with Cephalosporin use.     Social History   Socioeconomic History   Marital status: Single    Spouse name: Not on file   Number of children: Not on file   Years of education: Not on file   Highest education level: Not on file  Occupational History   Occupation: Retired: Presenter, broadcasting  Social Designer, fashion/clothing strain: Not on file   Food insecurity    Worry: Not on file    Inability: Not on file   Transportation needs    Medical: Not on file    Non-medical: Not on file  Tobacco Use   Smoking status: Never Smoker   Smokeless tobacco: Never Used  Substance and Sexual Activity   Alcohol use: No    Alcohol/week: 0.0 standard drinks   Drug use: No   Sexual activity: Not on file  Lifestyle   Physical activity    Days per week: Not on file    Minutes per session: Not on file   Stress: Not on file  Relationships   Social connections    Talks on phone: Not on file    Gets together: Not on file    Attends religious service: Not on file    Active member of club or organization: Not on file    Attends meetings of clubs or organizations: Not on file    Relationship status: Not on file   Intimate partner violence    Fear of current or ex partner: Not on file    Emotionally abused: Not on file    Physically abused: Not on file    Forced sexual activity: Not on file  Other Topics Concern   Not on file  Social History  Narrative   Pt lives in [place] alone. She was never married.   Retired Customer service manager.   Attends [employer]    Family History  Problem Relation Age  of Onset   Cancer Mother    Heart attack Father    Colon cancer Other     ROS- All systems are reviewed and negative except as per the HPI above  Physical Exam: Vitals:   10/24/18 1033  BP: 136/64  Pulse: 67  Weight: 84.8 kg  Height: 5\' 3"  (1.6 m)   Wt Readings from Last 3 Encounters:  10/24/18 84.8 kg  06/10/18 88.5 kg  06/02/18 90.5 kg    Labs: Lab Results  Component Value Date   NA 140 10/24/2018   K 3.8 10/24/2018   CL 110 10/24/2018   CO2 21 (L) 10/24/2018   GLUCOSE 115 (H) 10/24/2018   BUN 14 10/24/2018   CREATININE 1.19 (H) 10/24/2018   CALCIUM 9.3 10/24/2018   MG 2.0 10/24/2018   Lab Results  Component Value Date   INR 2.59 08/27/2017   No results found for: CHOL, HDL, LDLCALC, TRIG   GEN- The patient is well appearing, alert and oriented x 3 today.   Head- normocephalic, atraumatic Eyes-  Sclera clear, conjunctiva pink Ears- hearing intact Oropharynx- clear Neck- supple, no JVP Lymph- no cervical lymphadenopathy Lungs- Clear to ausculation bilaterally, normal work of breathing Heart- regular rate and rhythm, no murmurs, rubs or gallops, PMI not laterally displaced GI- soft, NT, ND, + BS Extremities- no clubbing, cyanosis, or edema MS- no significant deformity or atrophy Skin- no rash or lesion Psych- euthymic mood, full affect Neuro- strength and sensation are intact  EKG-  Sinus rhythm at 67 bpm, LAD, IRBBB,  Qtc 532, rechecked by Dr. Rayann Heman he feels it is 500 ms, no change in med indicated     Assessment and Plan: 1. Persistent  afib Has failed flecainide Doing well  with Tikosyn  with rare breakthrough  Continue Tikosyn at 500 mcg bid ( qt  prolonged but stable) Continue warfarin for chadsvasc score of 4 Continue Cartia XT bmet/mag  F/u with Dr. Domenic Polite 8/4   F/u with afib clinic in 4 months   Butch Penny C. Saralyn Willison, [facility] [address] [place], [place] [address] [phone]

## 2018-10-27 ENCOUNTER — Ambulatory Visit (INDEPENDENT_AMBULATORY_CARE_PROVIDER_SITE_OTHER): Payer: Medicare Other

## 2018-10-27 ENCOUNTER — Other Ambulatory Visit: Payer: Self-pay

## 2018-10-27 DIAGNOSIS — J4550 Severe persistent asthma, uncomplicated: Secondary | ICD-10-CM

## 2018-10-27 MED ORDER — MEPOLIZUMAB 100 MG ~~LOC~~ SOLR
100.0000 mg | Freq: Once | SUBCUTANEOUS | Status: AC
  Administered 2018-10-27: 100 mg via SUBCUTANEOUS

## 2018-10-27 NOTE — Progress Notes (Signed)
Have you been hospitalized within the last 10 days?  No Do you have a fever?  No Do you have a cough?  No Do you have a headache or sore throat? No  

## 2018-10-28 DIAGNOSIS — I10 Essential (primary) hypertension: Secondary | ICD-10-CM | POA: Diagnosis not present

## 2018-10-28 DIAGNOSIS — Z713 Dietary counseling and surveillance: Secondary | ICD-10-CM | POA: Diagnosis not present

## 2018-10-28 DIAGNOSIS — Z6834 Body mass index (BMI) 34.0-34.9, adult: Secondary | ICD-10-CM | POA: Diagnosis not present

## 2018-10-28 DIAGNOSIS — I4891 Unspecified atrial fibrillation: Secondary | ICD-10-CM | POA: Diagnosis not present

## 2018-10-28 DIAGNOSIS — Z299 Encounter for prophylactic measures, unspecified: Secondary | ICD-10-CM | POA: Diagnosis not present

## 2018-10-31 DIAGNOSIS — M545 Low back pain: Secondary | ICD-10-CM | POA: Diagnosis not present

## 2018-10-31 DIAGNOSIS — M9901 Segmental and somatic dysfunction of cervical region: Secondary | ICD-10-CM | POA: Diagnosis not present

## 2018-10-31 DIAGNOSIS — M9905 Segmental and somatic dysfunction of pelvic region: Secondary | ICD-10-CM | POA: Diagnosis not present

## 2018-10-31 DIAGNOSIS — M9903 Segmental and somatic dysfunction of lumbar region: Secondary | ICD-10-CM | POA: Diagnosis not present

## 2018-10-31 DIAGNOSIS — M542 Cervicalgia: Secondary | ICD-10-CM | POA: Diagnosis not present

## 2018-10-31 DIAGNOSIS — M546 Pain in thoracic spine: Secondary | ICD-10-CM | POA: Diagnosis not present

## 2018-10-31 DIAGNOSIS — M9902 Segmental and somatic dysfunction of thoracic region: Secondary | ICD-10-CM | POA: Diagnosis not present

## 2018-11-01 ENCOUNTER — Telehealth (INDEPENDENT_AMBULATORY_CARE_PROVIDER_SITE_OTHER): Payer: Medicare Other | Admitting: Cardiology

## 2018-11-01 ENCOUNTER — Encounter: Payer: Self-pay | Admitting: Cardiology

## 2018-11-01 VITALS — BP 162/82 | HR 55 | Ht 63.0 in | Wt 186.5 lb

## 2018-11-01 DIAGNOSIS — I10 Essential (primary) hypertension: Secondary | ICD-10-CM

## 2018-11-01 DIAGNOSIS — G4733 Obstructive sleep apnea (adult) (pediatric): Secondary | ICD-10-CM

## 2018-11-01 DIAGNOSIS — I495 Sick sinus syndrome: Secondary | ICD-10-CM

## 2018-11-01 DIAGNOSIS — I4819 Other persistent atrial fibrillation: Secondary | ICD-10-CM | POA: Diagnosis not present

## 2018-11-01 NOTE — Progress Notes (Signed)
Virtual Visit via Telephone Note   This visit type was conducted due to national recommendations for restrictions regarding the COVID-19 Pandemic (e.g. social distancing) in an effort to limit this patient's exposure and mitigate transmission in our community.  Due to her co-morbid illnesses, this patient is at least at moderate risk for complications without adequate follow up.  This format is felt to be most appropriate for this patient at this time.  The patient did not have access to video technology/had technical difficulties with video requiring transitioning to audio format only (telephone).  All issues noted in this document were discussed and addressed.  No physical exam could be performed with this format.  Please refer to the patient's chart for her  consent to telehealth for [facility].   Date:  11/01/2018   ID:  [name] [name], DOB [birth year], MRN [id number]  Patient Location: Home Provider Location: Office  PCP:  Monico Blitz, MD  Cardiologist:  Rozann Lesches, MD Electrophysiologist:  Thompson Grayer, MD   Evaluation Performed:  Follow-Up Visit  Chief Complaint:   Cardiac follow-up  History of Present Illness:    [name] [name] is a 78 y.o. female last seen in September 2019.  She has had interval follow-up with Dr. Rayann Heman and also in the atrial fibrillation clinic including a visit just recently on June 27.  She is on Tikosyn, having failed flecainide previously.  She tells me that she feels better in general, still has breakthrough atrial fibrillation, but has been less symptomatic.  In addition to Tikosyn she remains on Cartia XT 120 mg daily, and uses short acting Cardizem 30 mg tablets as needed.  She had recent lab work obtained by Ms. Kayleen Memos NP, results outlined below.  Potassium was 3.8 and it was recommended that she get a repeat check prior to adjusting her potassium dose.  This will be arranged.  Magnesium was normal.  She continues on Coumadin with  follow-up by Dr. Manuella Ghazi.  No reported bleeding problems.  The patient does not have symptoms concerning for COVID-19 infection (fever, chills, cough, or new shortness of breath).    Past Medical History:  Diagnosis Date  . Allergic rhinitis   . Anxiety   . Asthma   . Atrial flutter ([place])   . COPD (chronic obstructive pulmonary disease) ([place])   . Depression   . Essential hypertension   . Hyperlipidemia   . Lumbar disc disease   . Obesity   . OSA (obstructive sleep apnea)    CPAP  . Paroxysmal atrial fibrillation (HCC)    Element of tachycardia bradycardia syndrome  . Respiratory failure [facility])    Past Surgical History:  Procedure Laterality Date  . ANTERIOR FUSION LUMBAR SPINE    . BIOPSY  08/27/2016   Procedure: BIOPSY;  Surgeon: Rogene Houston, MD;  [facility];  Service: Endoscopy;;  FOUR GASTRIC POLYPS BIOPSIED  . CARDIOVERSION N/A 03/28/2014   Procedure: CARDIOVERSION;  Surgeon: Fay Records, MD;  [facility]: [facility];  Service: Cardiovascular;  Laterality: N/A;  . CARDIOVERSION N/A 10/22/2016   Procedure: CARDIOVERSION;  Surgeon: Sueanne Margarita, MD;  Location: [facility] ENDOSCOPY;  Service: Cardiovascular;  Laterality: N/A;  . CATARACT EXTRACTION    . COLONOSCOPY N/A 08/04/2012   Procedure: COLONOSCOPY;  Surgeon: Rogene Houston, MD;  [facility];  Service: Endoscopy;  Laterality: N/A;  730-rescheduled to [place] notified pt  . ELECTROPHYSIOLOGIC STUDY N/A 09/18/2014   Procedure: Atrial Fibrillation Ablation;  Surgeon:  Thompson Grayer, MD;  Location: [place] CV LAB;  Service: Cardiovascular;  Laterality: N/A;  . ESOPHAGOGASTRODUODENOSCOPY N/A 08/27/2016   Procedure: ESOPHAGOGASTRODUODENOSCOPY (EGD);  Surgeon: Rogene Houston, MD;  [facility];  Service: Endoscopy;  Laterality: N/A;  1200  . LASIK     Both eyes  . TEE WITHOUT CARDIOVERSION N/A 09/18/2014   Procedure: TRANSESOPHAGEAL ECHOCARDIOGRAM (TEE);  Surgeon: Larey Dresser, MD;  Location: [place];  Service: Cardiovascular;  Laterality: N/A;  . TEE WITHOUT CARDIOVERSION N/A 10/22/2016   Procedure: TRANSESOPHAGEAL ECHOCARDIOGRAM (TEE);  Surgeon: Sueanne Margarita, MD;  Location: [facility] ENDOSCOPY;  Service: Cardiovascular;  Laterality: N/A;  . TEE WITHOUT CARDIOVERSION N/A 08/24/2017   Procedure: TRANSESOPHAGEAL ECHOCARDIOGRAM (TEE);  Surgeon: Josue Hector, MD;  Location: [facility] ENDOSCOPY;  Service: Cardiovascular;  Laterality: N/A;  . TOTAL HIP ARTHROPLASTY  2010   Right     Current Meds  Medication Sig  . acetaminophen (TYLENOL) 500 MG tablet Take 500 mg by mouth daily as needed for headache.  . albuterol (PROAIR HFA) 108 (90 Base) MCG/ACT inhaler 2 puffs every 6 hours for wheezing as needed  . antiseptic oral rinse (BIOTENE) LIQD 15 mLs by Mouth Rinse route as needed for dry mouth.  Marland Kitchen atorvastatin (LIPITOR) 10 MG tablet Take 10 mg by mouth every evening.   . baclofen (LIORESAL) 10 MG tablet Take 10 mg by mouth 2 (two) times daily as needed for muscle spasms.   . benzonatate (TESSALON) 200 MG capsule Take 1 capsule (200 mg total) by mouth 3 (three) times daily as needed for cough.  . Biotin 1 MG CAPS Take 1,000 mg by mouth 3 (three) times a week.   . budesonide (PULMICORT) 0.5 MG/2ML nebulizer solution Take 0.5 mg by nebulization 2 (two) times daily.   . Calcium Carbonate-Vit D-Min (QC CALCIUM-MAGNESIUM-ZINC-D3) 333.4-133 MG-UNIT TABS Take 1 tablet by mouth 2 (two) times daily.   . cetirizine (ZYRTEC) 10 MG tablet Take 10 mg by mouth daily as needed for allergies.  . clindamycin (CLEOCIN) 300 MG capsule 2 capsules as needed. Take prior to dental procedures  . Dextromethorphan-guaiFENesin (MUCINEX DM) 30-600 MG TB12 Take 1-2 tablets by mouth every 12 (twelve) hours as needed.  . diltiazem (CARDIZEM) 30 MG tablet Take 1 tablet (30 mg total) by mouth as needed (for palpitations).  Marland Kitchen diltiazem (CARTIA XT) 120 MG 24 hr capsule Take 1 capsule (120 mg total) by mouth at bedtime.  .  dofetilide (TIKOSYN) 500 MCG capsule Take 1 capsule (500 mcg total) by mouth 2 (two) times daily.  . famotidine (PEPCID) 20 MG tablet Take 20 mg by mouth daily.  . fluticasone (FLONASE) 50 MCG/ACT nasal spray Place 1 spray into both nostrils 2 (two) times daily.  . hydrALAZINE (APRESOLINE) 25 MG tablet Take 25 mg by mouth daily.  Marland Kitchen HYDROcodone-homatropine (HYDROMET) 5-1.5 MG/5ML syrup Take 5 mLs by mouth every 6 (six) hours as needed for cough.  Marland Kitchen ipratropium-albuterol (DUONEB) 0.5-2.5 (3) MG/3ML SOLN Take 3 mLs by nebulization every 6 (six) hours as needed.   Marland Kitchen losartan (COZAAR) 100 MG tablet Take 1 tablet (100 mg total) by mouth daily.  . Magnesium 250 MG TABS Take 250 mg by mouth daily.  . montelukast (SINGULAIR) 10 MG tablet Take 10 mg by mouth at bedtime.  . pantoprazole (PROTONIX) 40 MG tablet Take 40 mg by mouth daily.  . potassium chloride SA (K-DUR,KLOR-CON) 20 MEQ tablet Take 1 tablet (20 mEq total) by mouth daily.  . sodium  chloride (OCEAN) 0.65 % SOLN nasal spray Place 1 spray into both nostrils 2 (two) times daily.   Marland Kitchen triamcinolone ointment (KENALOG) 0.1 % Apply 1 application topically daily as needed for rash.  . umeclidinium-vilanterol (ANORO ELLIPTA) 62.5-25 MCG/INH AEPB Inhale 1 puff into the lungs daily.  . WARFARIN SODIUM PO Take by mouth as directed. Patient is taking 5 mg once da ily, 2.5 mg on Monday & Friday.  . Wheat Dextrin (BENEFIBER ON THE GO) POWD Take 1 Dose by mouth daily as needed (constipation). 2 tsp daily in the morning.     Allergies:   Bupropion hcl, Escitalopram oxalate, Fluticasone-salmeterol, Lisinopril, and Serevent   Social History   Tobacco Use  . Smoking status: Never Smoker  . Smokeless tobacco: Never Used  Substance Use Topics  . Alcohol use: No    Alcohol/week: 0.0 standard drinks  . Drug use: No     Family Hx: The patient's family history includes Cancer in her mother; Colon cancer in an other family member; Heart attack in her father.   ROS:   Please see the history of present illness. All other systems reviewed and are negative.   Prior CV studies:   The following studies were reviewed today:  TEE 08/24/2017: Study Conclusions  - Left ventricle: Systolic function was normal. The estimated   ejection fraction was in the range of 55% to 60%. - Mitral valve: There was mild regurgitation. - Left atrium: The atrium was dilated. No evidence of thrombus in   the atrial cavity or appendage. No evidence of thrombus in the   appendage. - Right atrium: No evidence of thrombus in the atrial cavity or   appendage. - Atrial septum: No defect or patent foramen ovale was identified. - Tricuspid valve: There was mild-moderate regurgitation. - Impressions: Patient had TEE prior to Tikosyn load to r/o LAA   thrombus   No thrombus present     Moderate sedation : 30 minutes 5 mg versed 50 ug of fentanyl     3D rendering of atrial septum and mitral valve performed  Impressions:  - Patient had TEE prior to Tikosyn load to r/o LAA thrombus   No thrombus present     Moderate sedation : 30 minutes 5 mg versed 50 ug of fentanyl     3D rendering of atrial septum and mitral valve performed No   cardiac source of emboli was indentified.  Labs/Other Tests and Data Reviewed:    EKG:  An ECG dated 10/24/2018 was personally reviewed today and demonstrated:  Sinus rhythm with left anterior fascicular block, nonspecific ST-T changes, prolonged QTc.  Recent Labs: 06/02/2018: Hemoglobin 11.3; Platelets 167.0 10/24/2018: BUN 14; Creatinine, Ser 1.19; Magnesium 2.0; Potassium 3.8; Sodium 140    Wt Readings from Last 3 Encounters:  11/01/18 186 lb 8 oz (84.6 kg)  10/24/18 187 lb (84.8 kg)  06/10/18 195 lb (88.5 kg)     Objective:    Vital Signs:  BP (!) 162/82   Pulse (!) 55   Ht 5\' 3"  (1.6 m)   Wt 186 lb 8 oz (84.6 kg)   BMI 33.04 kg/m    Patient spoke in full sentences, not short of breath. No audible wheezing or coughing.  Speech pattern normal.  ASSESSMENT & PLAN:    1.  Persistent atrial fibrillation.  Patient reports overall symptom improvement with the realization that she is going to continue to have paroxysmal arrhythmia.  She seems to have a good handle on her current  regimen and use of as needed short acting Cardizem.  Continue Tikosyn and Cartia XT at present doses.  Follow-up BMET and if need be can increase her potassium supplements.  She continues on Coumadin with follow-up by PCP.  2.  Essential hypertension on multimodal therapy.  Blood pressure is elevated today, systolic was in the 456Y just recently however.  No changes made to present regimen, keep follow-up with PCP.  3.  OSA on CPAP.  4.  Tachycardia-bradycardia syndrome.  Overall stable symptomatically, no dizziness or syncope.  Recent heart rates in the mid 58s.  COVID-19 Education: The signs and symptoms of COVID-19 were discussed with the patient and how to seek care for testing (follow up with PCP or arrange E-visit).  The importance of social distancing was discussed today.  Time:   Today, I have spent 9 minutes with the patient with telehealth technology discussing the above problems.     Medication Adjustments/Labs and Tests Ordered: Current medicines are reviewed at length with the patient today.  Concerns regarding medicines are outlined above.   Tests Ordered: Orders Placed This Encounter  Procedures  . Basic metabolic panel    Medication Changes: No orders of the defined types were placed in this encounter.   Follow Up:  In Person 6 months in the [place] office.  Signed, Rozann Lesches, MD  11/01/2018 8:57 AM    [place]

## 2018-11-01 NOTE — Patient Instructions (Addendum)
Medication Instructions:   Your physician recommends that you continue on your current medications as directed. Please refer to the Current Medication list given to you today.  Labwork:  Your physician recommends that you return for lab work in: as soon as possible to check your BMET. Please have this done at [facility] in [place]. Your lab order has been faxed.  Testing/Procedures:  NONE  Follow-Up:  Your physician recommends that you schedule a follow-up appointment in: 6 months. You will receive a reminder letter in the mail in about 4 months reminding you to call and schedule your appointment. If you don't receive this letter, please contact our office.  Any Other Special Instructions Will Be Listed Below (If Applicable).  If you need a refill on your cardiac medications before your next appointment, please call your pharmacy.

## 2018-11-02 DIAGNOSIS — I495 Sick sinus syndrome: Secondary | ICD-10-CM | POA: Diagnosis not present

## 2018-11-02 DIAGNOSIS — I4819 Other persistent atrial fibrillation: Secondary | ICD-10-CM | POA: Diagnosis not present

## 2018-11-02 LAB — BASIC METABOLIC PANEL
BUN: 14 mg/dL (ref 7–25)
CO2: 26 mmol/L (ref 20–32)
Calcium: 9.3 mg/dL (ref 8.6–10.4)
Chloride: 107 mmol/L (ref 98–110)
Creat: 0.87 mg/dL (ref 0.60–0.93)
Glucose, Bld: 90 mg/dL (ref 65–99)
Potassium: 4.2 mmol/L (ref 3.5–5.3)
Sodium: 141 mmol/L (ref 135–146)

## 2018-11-03 ENCOUNTER — Telehealth: Payer: Self-pay | Admitting: *Deleted

## 2018-11-03 NOTE — Telephone Encounter (Signed)
Patient informed. Copy sent to PCP °

## 2018-11-03 NOTE — Telephone Encounter (Signed)
-----   Message from Satira Sark, MD sent at 11/02/2018  2:52 PM EDT ----- Results reviewed.  Please let her know that potassium is normal range, 4.2.  No change in current potassium supplementation.

## 2018-11-04 DIAGNOSIS — M542 Cervicalgia: Secondary | ICD-10-CM | POA: Diagnosis not present

## 2018-11-04 DIAGNOSIS — M9903 Segmental and somatic dysfunction of lumbar region: Secondary | ICD-10-CM | POA: Diagnosis not present

## 2018-11-04 DIAGNOSIS — M9901 Segmental and somatic dysfunction of cervical region: Secondary | ICD-10-CM | POA: Diagnosis not present

## 2018-11-04 DIAGNOSIS — M546 Pain in thoracic spine: Secondary | ICD-10-CM | POA: Diagnosis not present

## 2018-11-04 DIAGNOSIS — M9902 Segmental and somatic dysfunction of thoracic region: Secondary | ICD-10-CM | POA: Diagnosis not present

## 2018-11-04 DIAGNOSIS — M9905 Segmental and somatic dysfunction of pelvic region: Secondary | ICD-10-CM | POA: Diagnosis not present

## 2018-11-04 DIAGNOSIS — M545 Low back pain: Secondary | ICD-10-CM | POA: Diagnosis not present

## 2018-11-07 DIAGNOSIS — M9901 Segmental and somatic dysfunction of cervical region: Secondary | ICD-10-CM | POA: Diagnosis not present

## 2018-11-07 DIAGNOSIS — M546 Pain in thoracic spine: Secondary | ICD-10-CM | POA: Diagnosis not present

## 2018-11-07 DIAGNOSIS — M9903 Segmental and somatic dysfunction of lumbar region: Secondary | ICD-10-CM | POA: Diagnosis not present

## 2018-11-07 DIAGNOSIS — M9905 Segmental and somatic dysfunction of pelvic region: Secondary | ICD-10-CM | POA: Diagnosis not present

## 2018-11-07 DIAGNOSIS — M545 Low back pain: Secondary | ICD-10-CM | POA: Diagnosis not present

## 2018-11-07 DIAGNOSIS — M9902 Segmental and somatic dysfunction of thoracic region: Secondary | ICD-10-CM | POA: Diagnosis not present

## 2018-11-07 DIAGNOSIS — M542 Cervicalgia: Secondary | ICD-10-CM | POA: Diagnosis not present

## 2018-11-11 DIAGNOSIS — M542 Cervicalgia: Secondary | ICD-10-CM | POA: Diagnosis not present

## 2018-11-11 DIAGNOSIS — M545 Low back pain: Secondary | ICD-10-CM | POA: Diagnosis not present

## 2018-11-11 DIAGNOSIS — M9903 Segmental and somatic dysfunction of lumbar region: Secondary | ICD-10-CM | POA: Diagnosis not present

## 2018-11-11 DIAGNOSIS — M9905 Segmental and somatic dysfunction of pelvic region: Secondary | ICD-10-CM | POA: Diagnosis not present

## 2018-11-11 DIAGNOSIS — M9901 Segmental and somatic dysfunction of cervical region: Secondary | ICD-10-CM | POA: Diagnosis not present

## 2018-11-11 DIAGNOSIS — M546 Pain in thoracic spine: Secondary | ICD-10-CM | POA: Diagnosis not present

## 2018-11-11 DIAGNOSIS — M9902 Segmental and somatic dysfunction of thoracic region: Secondary | ICD-10-CM | POA: Diagnosis not present

## 2018-11-14 ENCOUNTER — Telehealth: Payer: Self-pay | Admitting: Internal Medicine

## 2018-11-14 DIAGNOSIS — M545 Low back pain: Secondary | ICD-10-CM | POA: Diagnosis not present

## 2018-11-14 DIAGNOSIS — M542 Cervicalgia: Secondary | ICD-10-CM | POA: Diagnosis not present

## 2018-11-14 DIAGNOSIS — M9901 Segmental and somatic dysfunction of cervical region: Secondary | ICD-10-CM | POA: Diagnosis not present

## 2018-11-14 DIAGNOSIS — M9902 Segmental and somatic dysfunction of thoracic region: Secondary | ICD-10-CM | POA: Diagnosis not present

## 2018-11-14 DIAGNOSIS — M9903 Segmental and somatic dysfunction of lumbar region: Secondary | ICD-10-CM | POA: Diagnosis not present

## 2018-11-14 DIAGNOSIS — M9905 Segmental and somatic dysfunction of pelvic region: Secondary | ICD-10-CM | POA: Diagnosis not present

## 2018-11-14 DIAGNOSIS — M546 Pain in thoracic spine: Secondary | ICD-10-CM | POA: Diagnosis not present

## 2018-11-14 NOTE — Telephone Encounter (Signed)
Nucala Order: 100mg  #1 Vial Order Date: 11/14/18 Expected date of arrival: 11/15/18 Ordered by: [facility]: [facility]

## 2018-11-15 NOTE — Telephone Encounter (Signed)
Nucala Shipment Received: 100mg  #1 vial Medication arrival date: 11/15/18 Lot #: [id number] Exp date: 11/28/2021 Received by: Leander Rams, LPN

## 2018-11-19 DIAGNOSIS — Z23 Encounter for immunization: Secondary | ICD-10-CM | POA: Diagnosis not present

## 2018-11-21 DIAGNOSIS — M545 Low back pain: Secondary | ICD-10-CM | POA: Diagnosis not present

## 2018-11-21 DIAGNOSIS — M542 Cervicalgia: Secondary | ICD-10-CM | POA: Diagnosis not present

## 2018-11-21 DIAGNOSIS — M546 Pain in thoracic spine: Secondary | ICD-10-CM | POA: Diagnosis not present

## 2018-11-21 DIAGNOSIS — M9902 Segmental and somatic dysfunction of thoracic region: Secondary | ICD-10-CM | POA: Diagnosis not present

## 2018-11-21 DIAGNOSIS — M9901 Segmental and somatic dysfunction of cervical region: Secondary | ICD-10-CM | POA: Diagnosis not present

## 2018-11-21 DIAGNOSIS — M9903 Segmental and somatic dysfunction of lumbar region: Secondary | ICD-10-CM | POA: Diagnosis not present

## 2018-11-21 DIAGNOSIS — M9905 Segmental and somatic dysfunction of pelvic region: Secondary | ICD-10-CM | POA: Diagnosis not present

## 2018-11-22 ENCOUNTER — Telehealth: Payer: Self-pay | Admitting: Internal Medicine

## 2018-11-22 NOTE — Telephone Encounter (Signed)
ATC Patient.  LMTCB. 

## 2018-11-23 NOTE — Telephone Encounter (Signed)
Called and spoke with Patient. Patient stated she was told that a man that worked at her house weekly, was tested positive for covid.  Patient stated worker had been inside her house.  Patient stated she had been around him with no mask at times. Patient stated she was scheduled for covid test 11/25/18 at [facility].   Patient denies symptoms of Covid.  Patient stated she would call office with covid results next week.  Patient stated she was told she should receive covid results within 3 days. Injection scheduled for 11/24/18, cancelled.

## 2018-11-24 ENCOUNTER — Ambulatory Visit: Payer: Medicare Other

## 2018-11-26 IMAGING — CT CT NECK W/ CM
5 of 7 series · 15 of 33 positions shown, 16 images · IV contrast (iopamidol)
Comparison: Chest CT today reported separately. [HOSPITAL] [facility] Paranasal sinus CT and chest CT 04/18/2013.

CLINICAL DATA: 75-year-old female with stridor and cough for 1
month. Frequent respiratory infections.

EXAM:
CT NECK WITH CONTRAST
TECHNIQUE: Multidetector CT imaging of the neck was performed using the
standard protocol following the bolus administration of intravenous
contrast.
CONTRAST:  100mL [id number] IOPAMIDOL (BO95KN-H55) INJECTION 61% in
conjunction with contrast enhanced imaging of the chest reported
separately.

[Series 2: axial st · axial · 0.79mm/px · z∈[-354,-168]mm · 4 of 157 slices shown, 5 images]
[im 32/157  soft-tissue]
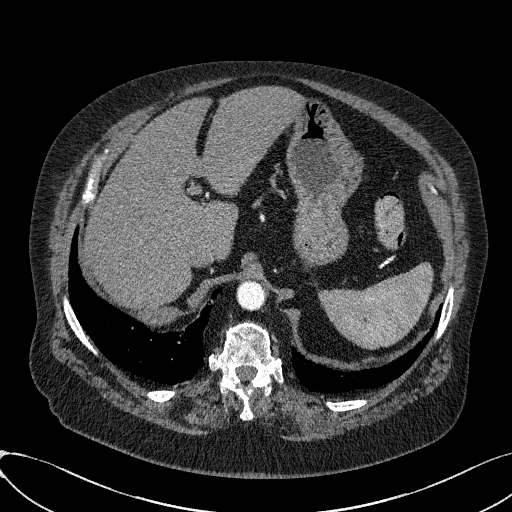
[im 32/157  bone]
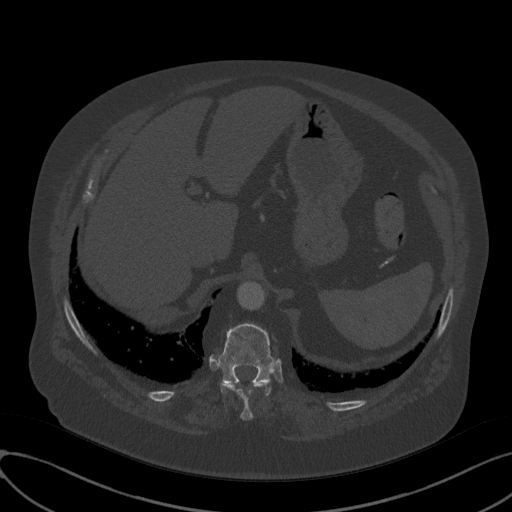
[im 63/157  bone]
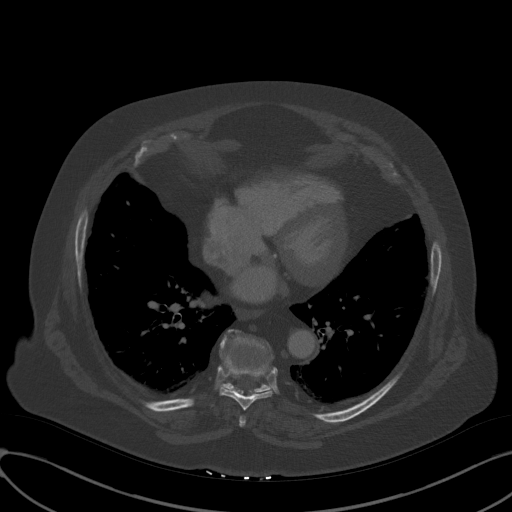
[im 94/157  bone]
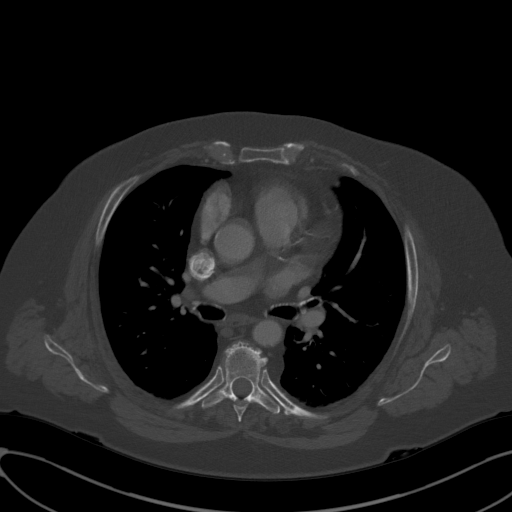
[im 125/157  bone]
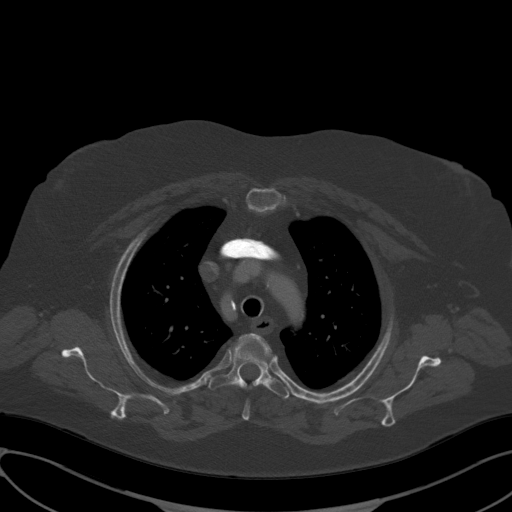

[Series 5: coronal · coronal · 0.66mm/px · 2 of 151 slices shown]
[im 51/151  bone]
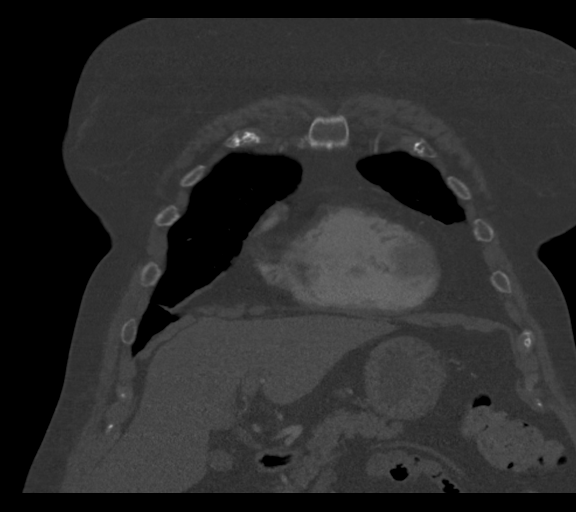
[im 101/151  bone]
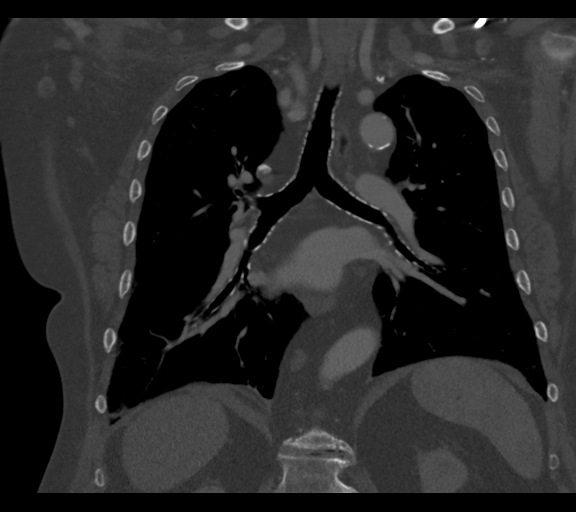

[Series 6: sagittal · sagittal · 0.66mm/px · 5 of 165 slices shown]
[im 24/165  bone]
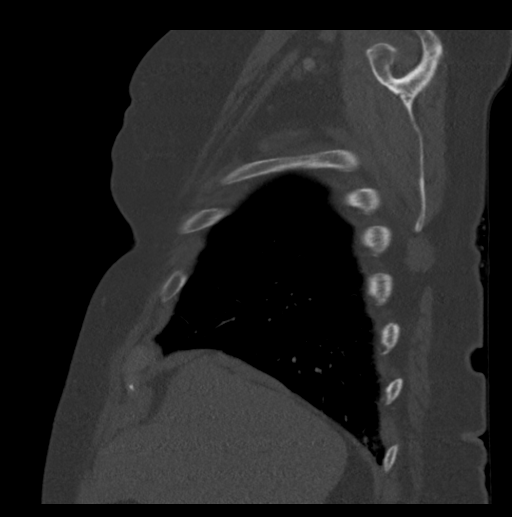
[im 47/165  bone]
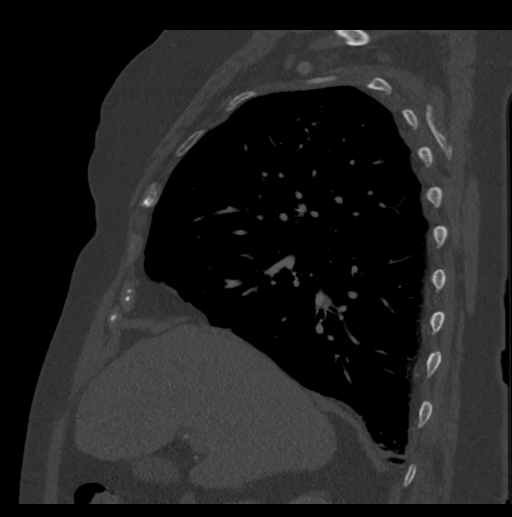
[im 71/165  bone]
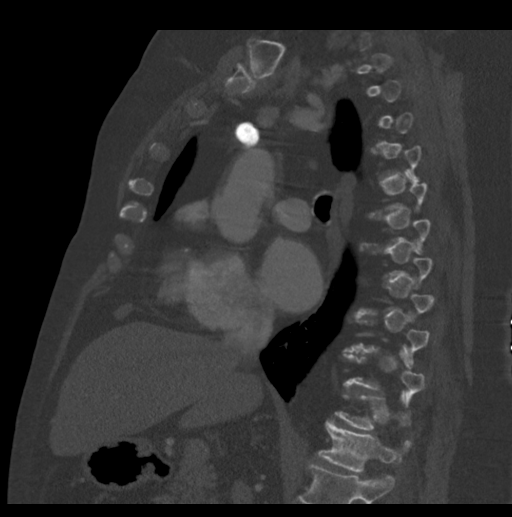
[im 94/165  bone]
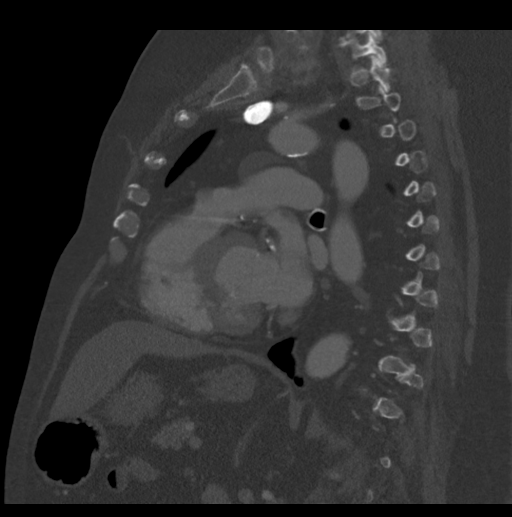
[im 118/165  bone]
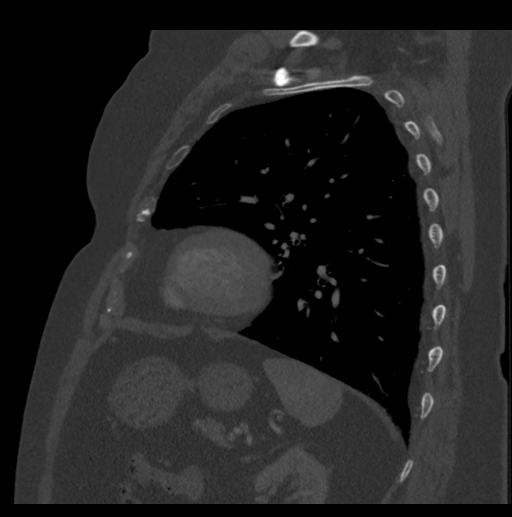

[Series 7: axial neck · axial · 0.47mm/px · z∈[-125,-51]mm · 2 of 112 slices shown]
[im 38/112  bone]
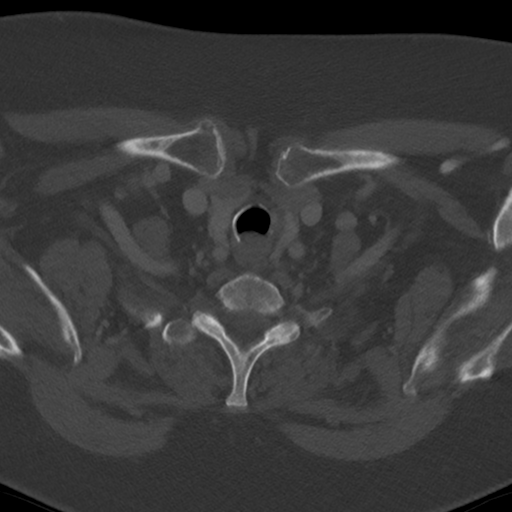
[im 75/112  bone]
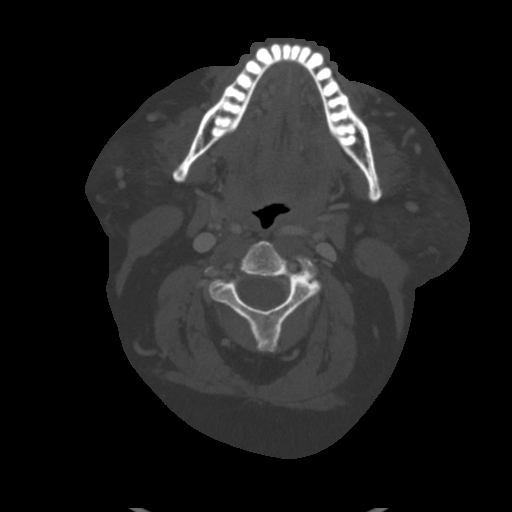

[Series 13: orthogonal ax · axial · 0.39mm/px · z∈[-143,-65]mm · 2 of 118 slices shown]
[im 40/118  bone]
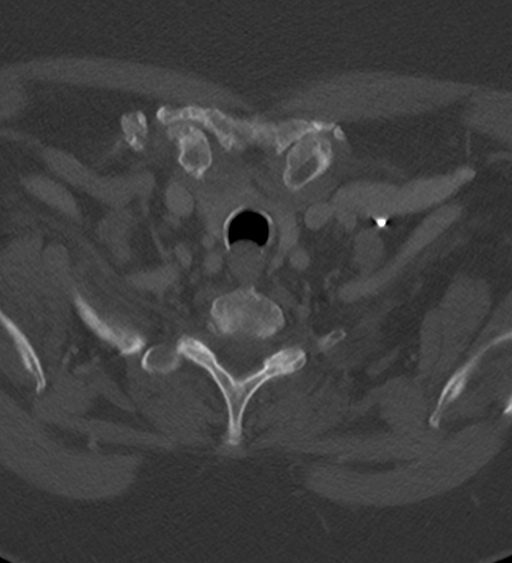
[im 79/118  bone]
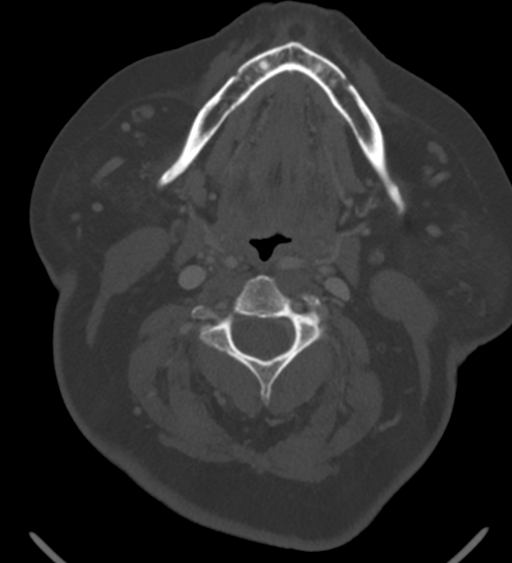

[15 of 33 positions shown; findings below may reference images not displayed]

FINDINGS: Pharynx and larynx: The larynx appears normal.

However the does appear to be some exaggerated posterior impression
along the posterior wall of the trachea at the thoracic inlet
(series 8, image 77). The anterior and lateral walls of the trachea
have a normal contour and are non thickened. No filling defect
within the visible trachea.

Pharyngeal soft tissue contours are within normal limits. There is a
retropharyngeal course of both carotid arteries (more so the left),
but otherwise normal retropharyngeal space. Negative parapharyngeal
spaces.

Salivary glands: Negative sublingual space, submandibular glands and
parotid glands.

Thyroid: Round exophytic right lower pole thyroid nodule measures 21
mm diameter with heterogeneity. Furthermore, this rounded exophytic
thyroid nodule may itself have a separate exophytic thyroid or
parathyroid mass tracking into the right prevascular space of the
superior mediastinum measuring up to 16 mm. This second lesion is
more oblong. Both are visible on series 11, image 34. These were
present on the 7777 chest CT and appear stable since that time.

Right and left thyroid lobe otherwise are within normal limits.

Lymph nodes: Negative.  No cervical lymphadenopathy.

Vascular: Major vascular structures in the neck and at the skullbase
are patent. Partially retropharyngeal course of both carotids. Mild
for age carotid bifurcation atherosclerosis. Bilateral ICA siphon
calcified plaque. The left vertebral artery appears mildly dominant.

Limited intracranial: Negative.

Visualized orbits: Negative.

Mastoids and visualized paranasal sinuses: Visualized paranasal
sinuses and mastoids are stable and well pneumatized.

Skeleton: No acute osseous abnormality identified. Widespread
degenerative changes in the cervical spine, with bulky upper
cervical left side facet arthropathy and bulky lower cervical
endplate spurring. No acute osseous abnormality identified.

Upper chest: Reported separately today.
IMPRESSION: 1. Possible mild tracheomalacia at the thoracic inlet, but otherwise
the upper airway is within normal limits. No laryngeal or pharyngeal
mass or abnormality.
2. Two distinct exophytic appearing right thyroid and/or parathyroid
nodules in the right superior mediastinum are chronic and appear
stable since [DATE]. See also chest CT from today which is reported separately.

## 2018-11-29 ENCOUNTER — Telehealth: Payer: Self-pay | Admitting: Internal Medicine

## 2018-11-29 DIAGNOSIS — J441 Chronic obstructive pulmonary disease with (acute) exacerbation: Secondary | ICD-10-CM | POA: Diagnosis not present

## 2018-11-29 DIAGNOSIS — Z6834 Body mass index (BMI) 34.0-34.9, adult: Secondary | ICD-10-CM | POA: Diagnosis not present

## 2018-11-29 DIAGNOSIS — M87059 Idiopathic aseptic necrosis of unspecified femur: Secondary | ICD-10-CM | POA: Diagnosis not present

## 2018-11-29 DIAGNOSIS — Z299 Encounter for prophylactic measures, unspecified: Secondary | ICD-10-CM | POA: Diagnosis not present

## 2018-11-29 DIAGNOSIS — I10 Essential (primary) hypertension: Secondary | ICD-10-CM | POA: Diagnosis not present

## 2018-11-29 DIAGNOSIS — I4891 Unspecified atrial fibrillation: Secondary | ICD-10-CM | POA: Diagnosis not present

## 2018-11-29 NOTE — Telephone Encounter (Signed)
Called and spoke with Patient.  Patient requested to schedule her Nucala injection for around 0945, 11/30/18.  Patient scheduled 0945, 11/30/18, for Nucala injection. Nothing further at this time.

## 2018-11-30 ENCOUNTER — Other Ambulatory Visit: Payer: Self-pay

## 2018-11-30 ENCOUNTER — Ambulatory Visit (INDEPENDENT_AMBULATORY_CARE_PROVIDER_SITE_OTHER): Payer: Medicare Other

## 2018-11-30 DIAGNOSIS — J4550 Severe persistent asthma, uncomplicated: Secondary | ICD-10-CM | POA: Diagnosis not present

## 2018-11-30 MED ORDER — MEPOLIZUMAB 100 MG ~~LOC~~ SOLR
100.0000 mg | SUBCUTANEOUS | Status: DC
  Administered 2018-11-30: 100 mg via SUBCUTANEOUS

## 2018-11-30 NOTE — Progress Notes (Signed)
All questions were answered by the patient before medication was administered. Have you been hospitalized in the last 10 days? No Do you have a fever? No Do you have a cough? No Do you have a headache or sore throat? No  

## 2018-12-02 DIAGNOSIS — M9905 Segmental and somatic dysfunction of pelvic region: Secondary | ICD-10-CM | POA: Diagnosis not present

## 2018-12-02 DIAGNOSIS — M545 Low back pain: Secondary | ICD-10-CM | POA: Diagnosis not present

## 2018-12-02 DIAGNOSIS — M9903 Segmental and somatic dysfunction of lumbar region: Secondary | ICD-10-CM | POA: Diagnosis not present

## 2018-12-02 DIAGNOSIS — M542 Cervicalgia: Secondary | ICD-10-CM | POA: Diagnosis not present

## 2018-12-02 DIAGNOSIS — M546 Pain in thoracic spine: Secondary | ICD-10-CM | POA: Diagnosis not present

## 2018-12-02 DIAGNOSIS — M9901 Segmental and somatic dysfunction of cervical region: Secondary | ICD-10-CM | POA: Diagnosis not present

## 2018-12-02 DIAGNOSIS — M9902 Segmental and somatic dysfunction of thoracic region: Secondary | ICD-10-CM | POA: Diagnosis not present

## 2018-12-12 DIAGNOSIS — M9901 Segmental and somatic dysfunction of cervical region: Secondary | ICD-10-CM | POA: Diagnosis not present

## 2018-12-12 DIAGNOSIS — M545 Low back pain: Secondary | ICD-10-CM | POA: Diagnosis not present

## 2018-12-12 DIAGNOSIS — M9903 Segmental and somatic dysfunction of lumbar region: Secondary | ICD-10-CM | POA: Diagnosis not present

## 2018-12-12 DIAGNOSIS — M9905 Segmental and somatic dysfunction of pelvic region: Secondary | ICD-10-CM | POA: Diagnosis not present

## 2018-12-12 DIAGNOSIS — M542 Cervicalgia: Secondary | ICD-10-CM | POA: Diagnosis not present

## 2018-12-12 DIAGNOSIS — M9902 Segmental and somatic dysfunction of thoracic region: Secondary | ICD-10-CM | POA: Diagnosis not present

## 2018-12-12 DIAGNOSIS — M546 Pain in thoracic spine: Secondary | ICD-10-CM | POA: Diagnosis not present

## 2018-12-19 ENCOUNTER — Telehealth: Payer: Self-pay | Admitting: Internal Medicine

## 2018-12-19 ENCOUNTER — Other Ambulatory Visit (HOSPITAL_COMMUNITY): Payer: Self-pay

## 2018-12-19 ENCOUNTER — Telehealth: Payer: Self-pay

## 2018-12-19 DIAGNOSIS — J4550 Severe persistent asthma, uncomplicated: Secondary | ICD-10-CM

## 2018-12-19 MED ORDER — DILTIAZEM HCL ER COATED BEADS 120 MG PO CP24: 120.0000 mg | ORAL_CAPSULE | Freq: Every day | ORAL | 2 refills | Status: DC

## 2018-12-19 NOTE — Telephone Encounter (Signed)
Call returned to patient, she reports she needs nebulizer supplies. She reports her DME is [facility]. I made her aware I would get the message placed and we would get it sent [place]. Voiced understanding.   CY please advise if willing to sign order for nebulizer supplies. Thanks.

## 2018-12-19 NOTE — Telephone Encounter (Signed)
Yes, no problem!

## 2018-12-19 NOTE — Telephone Encounter (Signed)
lmtcb for pt.  Order placed for replacement supplies as requested.  Just need to let pt know that her requested supplies have been ordered.

## 2018-12-19 NOTE — Telephone Encounter (Signed)
Nucala Order: 100mg  #1 Vial Order Date: 12/19/2018 Expected date of arrival: 12/20/2018 Ordered by: Desmond Dike, [place]  [facility]: [facility]

## 2018-12-20 NOTE — Telephone Encounter (Signed)
Spoke with pt and advised her that her order was sent for her replacement supplies. Nothing further is needed.

## 2018-12-20 NOTE — Telephone Encounter (Signed)
Nucala Shipment Received: 100mg  #1 vial Medication arrival date:12/20/18 Lot #: [id number] Exp date: 01/28/2022 Received by: Elliot Dally

## 2018-12-26 DIAGNOSIS — M9902 Segmental and somatic dysfunction of thoracic region: Secondary | ICD-10-CM | POA: Diagnosis not present

## 2018-12-26 DIAGNOSIS — M545 Low back pain: Secondary | ICD-10-CM | POA: Diagnosis not present

## 2018-12-26 DIAGNOSIS — M542 Cervicalgia: Secondary | ICD-10-CM | POA: Diagnosis not present

## 2018-12-26 DIAGNOSIS — M9903 Segmental and somatic dysfunction of lumbar region: Secondary | ICD-10-CM | POA: Diagnosis not present

## 2018-12-26 DIAGNOSIS — M9905 Segmental and somatic dysfunction of pelvic region: Secondary | ICD-10-CM | POA: Diagnosis not present

## 2018-12-26 DIAGNOSIS — M9901 Segmental and somatic dysfunction of cervical region: Secondary | ICD-10-CM | POA: Diagnosis not present

## 2018-12-26 DIAGNOSIS — M546 Pain in thoracic spine: Secondary | ICD-10-CM | POA: Diagnosis not present

## 2018-12-28 ENCOUNTER — Other Ambulatory Visit: Payer: Self-pay

## 2018-12-28 ENCOUNTER — Ambulatory Visit (INDEPENDENT_AMBULATORY_CARE_PROVIDER_SITE_OTHER): Payer: Medicare Other

## 2018-12-28 DIAGNOSIS — J4550 Severe persistent asthma, uncomplicated: Secondary | ICD-10-CM

## 2018-12-28 MED ORDER — EPINEPHRINE 0.3 MG/0.3ML IJ SOAJ: 0.3000 mg | Freq: Once | INTRAMUSCULAR | 5 refills | Status: AC

## 2018-12-28 MED ORDER — MEPOLIZUMAB 100 MG ~~LOC~~ SOLR
100.0000 mg | Freq: Once | SUBCUTANEOUS | Status: AC
  Administered 2018-12-28: 100 mg via SUBCUTANEOUS

## 2018-12-28 NOTE — Progress Notes (Signed)
Have you been hospitalized within the last 10 days?  No Do you have a fever?  No Do you have a cough?  No Do you have a headache or sore throat? No Do you have your Epi Pen visible and is it within date?  Yes   Patient requested new Epipen prescription to [facility]. Epipen prescription sent to requested pharmacy.

## 2018-12-29 DIAGNOSIS — I4891 Unspecified atrial fibrillation: Secondary | ICD-10-CM | POA: Diagnosis not present

## 2018-12-29 DIAGNOSIS — I10 Essential (primary) hypertension: Secondary | ICD-10-CM | POA: Diagnosis not present

## 2018-12-29 DIAGNOSIS — Z713 Dietary counseling and surveillance: Secondary | ICD-10-CM | POA: Diagnosis not present

## 2018-12-29 DIAGNOSIS — Z6834 Body mass index (BMI) 34.0-34.9, adult: Secondary | ICD-10-CM | POA: Diagnosis not present

## 2018-12-29 DIAGNOSIS — Z299 Encounter for prophylactic measures, unspecified: Secondary | ICD-10-CM | POA: Diagnosis not present

## 2019-01-12 DIAGNOSIS — I10 Essential (primary) hypertension: Secondary | ICD-10-CM | POA: Diagnosis not present

## 2019-01-12 DIAGNOSIS — Z713 Dietary counseling and surveillance: Secondary | ICD-10-CM | POA: Diagnosis not present

## 2019-01-12 DIAGNOSIS — Z6834 Body mass index (BMI) 34.0-34.9, adult: Secondary | ICD-10-CM | POA: Diagnosis not present

## 2019-01-12 DIAGNOSIS — Z299 Encounter for prophylactic measures, unspecified: Secondary | ICD-10-CM | POA: Diagnosis not present

## 2019-01-12 DIAGNOSIS — I4891 Unspecified atrial fibrillation: Secondary | ICD-10-CM | POA: Diagnosis not present

## 2019-01-16 ENCOUNTER — Telehealth: Payer: Self-pay | Admitting: Internal Medicine

## 2019-01-16 NOTE — Telephone Encounter (Signed)
Nucala Order: 100mg  #1 Vial Order Date: 01/16/19 Expected date of arrival: 01/17/19 Ordered by: [place]: Nigel Mormon

## 2019-01-17 NOTE — Telephone Encounter (Signed)
Nucala Shipment Received: 100mg  #1 vial Medication arrival date: 01/17/2019 Lot #: [id number] Exp date: 01/2022 Received by: Desmond Dike, [place]

## 2019-01-24 ENCOUNTER — Other Ambulatory Visit (HOSPITAL_COMMUNITY): Payer: Self-pay | Admitting: *Deleted

## 2019-01-24 ENCOUNTER — Telehealth: Payer: Self-pay | Admitting: Cardiology

## 2019-01-24 DIAGNOSIS — H04123 Dry eye syndrome of bilateral lacrimal glands: Secondary | ICD-10-CM | POA: Diagnosis not present

## 2019-01-24 DIAGNOSIS — H5213 Myopia, bilateral: Secondary | ICD-10-CM | POA: Diagnosis not present

## 2019-01-24 DIAGNOSIS — Z961 Presence of intraocular lens: Secondary | ICD-10-CM | POA: Diagnosis not present

## 2019-01-24 MED ORDER — POTASSIUM CHLORIDE CRYS ER 20 MEQ PO TBCR: 20.0000 meq | EXTENDED_RELEASE_TABLET | Freq: Every day | ORAL | 2 refills | Status: DC

## 2019-01-24 NOTE — Telephone Encounter (Signed)
Pt says at 2am she woke up 175/99 HR 113 with some SOB and palpitations took 30 mg diltiazem at 2am and 830am and HR went back down to 63. Pt denies any symptoms at this time - pt has upcoming appt with Afib clinic 11/19 - will forward to provider FYI - pt will contact us or Afib clinic if symptoms return

## 2019-01-24 NOTE — Telephone Encounter (Signed)
Received telephone call from [facility]. Patient was in the office getting an eye exam. States that since 200 am she has been in and out of A. Fib. They checked her BP at 830 am 116/70 P 63. States that she is feeling better.

## 2019-01-25 ENCOUNTER — Ambulatory Visit (INDEPENDENT_AMBULATORY_CARE_PROVIDER_SITE_OTHER): Payer: Medicare Other

## 2019-01-25 ENCOUNTER — Other Ambulatory Visit: Payer: Self-pay

## 2019-01-25 DIAGNOSIS — J4550 Severe persistent asthma, uncomplicated: Secondary | ICD-10-CM | POA: Diagnosis not present

## 2019-01-25 MED ORDER — MEPOLIZUMAB 100 MG ~~LOC~~ SOLR
100.0000 mg | SUBCUTANEOUS | Status: DC
  Administered 2019-01-25: 10:00:00 100 mg via SUBCUTANEOUS

## 2019-01-25 NOTE — Progress Notes (Signed)
All questions were answered by the patient before medication was administered. Have you been hospitalized in the last 10 days? No Do you have a fever? No Do you have a cough? No Do you have a headache or sore throat? No  

## 2019-02-01 DIAGNOSIS — M9903 Segmental and somatic dysfunction of lumbar region: Secondary | ICD-10-CM | POA: Diagnosis not present

## 2019-02-01 DIAGNOSIS — M9902 Segmental and somatic dysfunction of thoracic region: Secondary | ICD-10-CM | POA: Diagnosis not present

## 2019-02-01 DIAGNOSIS — M546 Pain in thoracic spine: Secondary | ICD-10-CM | POA: Diagnosis not present

## 2019-02-01 DIAGNOSIS — M9901 Segmental and somatic dysfunction of cervical region: Secondary | ICD-10-CM | POA: Diagnosis not present

## 2019-02-01 DIAGNOSIS — M545 Low back pain: Secondary | ICD-10-CM | POA: Diagnosis not present

## 2019-02-01 DIAGNOSIS — M542 Cervicalgia: Secondary | ICD-10-CM | POA: Diagnosis not present

## 2019-02-01 DIAGNOSIS — M9905 Segmental and somatic dysfunction of pelvic region: Secondary | ICD-10-CM | POA: Diagnosis not present

## 2019-02-07 ENCOUNTER — Encounter: Payer: Self-pay | Admitting: Internal Medicine

## 2019-02-07 ENCOUNTER — Other Ambulatory Visit: Payer: Self-pay

## 2019-02-07 ENCOUNTER — Ambulatory Visit (INDEPENDENT_AMBULATORY_CARE_PROVIDER_SITE_OTHER): Payer: Medicare Other | Admitting: Internal Medicine

## 2019-02-07 DIAGNOSIS — J4520 Mild intermittent asthma, uncomplicated: Secondary | ICD-10-CM

## 2019-02-07 DIAGNOSIS — G4733 Obstructive sleep apnea (adult) (pediatric): Secondary | ICD-10-CM | POA: Diagnosis not present

## 2019-02-07 DIAGNOSIS — I480 Paroxysmal atrial fibrillation: Secondary | ICD-10-CM

## 2019-02-07 DIAGNOSIS — J0100 Acute maxillary sinusitis, unspecified: Secondary | ICD-10-CM | POA: Diagnosis not present

## 2019-02-07 NOTE — Assessment & Plan Note (Signed)
Obesity with hypoventilation body habitus, continues to impact exercise tolerance. Weight loss is encouraged.

## 2019-02-07 NOTE — Assessment & Plan Note (Signed)
Can tell when she is in AFib. Managed by cardiology.

## 2019-02-07 NOTE — Patient Instructions (Signed)
We can continue current meds  We can continue CPAP 9, mask of choice, humidifier, supplies, AirView/ card  Please call if we can help

## 2019-02-07 NOTE — Assessment & Plan Note (Signed)
Benefits from CPAP and used whenever she sleeps.  Pressure 9 works well. Machine about 79 years old.

## 2019-02-07 NOTE — Assessment & Plan Note (Signed)
More stable with no recent acute episode.  Plan- continue current regimen.

## 2019-02-07 NOTE — Progress Notes (Signed)
HPI female never smoker followed for OSA, allergic rhinitis, asthma, Hyper IgE,  complicated by A. Fib/Coumadin, HBP, GERD, obesity NPSG prior to EMR in 2012 Office Spirometry 09/02/16-WNL. FVC 2.06/76%, FEV1 1.57/77%, ratio 0.76, FEF 25-75% 1.26/80% Allergy labs 09/02/16- eosinophils 200, total IgE 4230 causing elevation of all specific allergen antibody levels including Aspergillus. Nucala  + 2018 PFT-10/13/16-minimal obstruction without response to dilator, minimal reduction of diffusion. FVC 2.02/77%, FEV1 1.62/83%, ratio 0.80, TLC 92%, DLCO 77% CT soft tissue neck 09/22/16-1. 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 Subsegmental atelectasis and nonspecific linear patchy densities in the lungs as described. They have a benign appearance.  Small hyperdense masses in the mediastinum are stable compared with 2015 supporting benign etiology such as ectopic thyroid tissue. Three-vessel prominent coronary artery calcification. Bibasilar bronchiolectasis. Labs 09/20/2017-WBC 14,900, hemoglobin 11.2, eosinophils absent, lymphocytes low( ?steroids or viral?), BNP 220, CMET wnl IgE 04/12/2018- 2,574, 10/16/16- 4,302, 11/09/13- 3,513 Office Spirometry 04/12/2018-mild restriction of exhaled volume.  Possible mild obstructive airways.  FVC 1.9/73%, FEV1 1.4/75%, ratio 1.76, FEF 25-75% 1.2/80%  -------------------------------------------------------------------------------------------  06/02/2018- 78 year old female never smoker followed for OSA, allergic rhinitis, asthma/ Nucala, Hyper IgE, complicated by A. Fib/Coumadin, HBP, GERD, obesity CPAP 9/[facility] -----pt presents with sinus congestion, mainly on L side, breathing better, still has "coughing spells"; denies chest tightness/pain; takes Anoro Anoro, Singulair, neb Duoneb/ budesonide, Proair hfa, tessalon 200 Had persistent left maxillary sinus pain for 2 weeks with no bleeding on warfarin.   Ears okay left upper rear teeth sore.  No fever. She feels her asthma control is better on Nucala. She had tolerated amoxicillin without problems despite childhood history of intolerance to penicillin.  Frequent pneumonias as a child. Now pending foot surgery. Asks bariatric counseling for weight loss.  02/07/2019- 78 year old female never smoker followed for OSA, allergic rhinitis, asthma/ Nucala, Hyper IgE, complicated by A. Fib/Coumadin, HBP, GERD, obesity CPAP 9/[facility] -----6 month f/u OSA Proair hfa, Pulmicort neb solu, neb Duoneb, Singulair, flonase, Anoro,  Download compliance 97%, AHI 2.1/ hr Body weight today -190 lbs On doxycycline from PCP for sinusitis.Chest feels stable and breathing controlled. Using neb Duoneb and Pulmicort once daily. Infrequent rescue. Peak flow runs around 300.  Woke last night in AFib, back in NSR by 4:30 AM.  CPAP ok. Sleeping 10-12 hours/ day.   ROS-see HPI   + = positive Constitutional:   No-   weight loss, night sweats, fevers, chills, fatigue, lassitude. HEENT:   No-  headaches, difficulty swallowing, tooth/dental problems, sore throat,       No-  sneezing, itching,  ear ache,  +nasal congestion, post nasal drip,  CV:  No-   chest pain, orthopnea, PND, swelling in lower extremities, anasarca, dizziness, palpitations Resp: +  shortness of breath with exertion or at rest.                productive cough,  + non-productive cough,  No- coughing up of blood.              No-change in color of mucus. + wheezing.   Skin: No-   rash or lesions. GI:  No-   heartburn, indigestion, abdominal pain, nausea, vomiting, GU:  MS:  No-   joint pain or swelling. . Neuro-     nothing unusual Psych:  No- change in mood or affect. No depression or anxiety.  No memory loss.  OBJ- Physical Exam General- Alert, Oriented, Affect-appropriate, Distress- none acute, + obese Skin-  rash-none, lesions- none, excoriation- none Lymphadenopathy- none Head-  atraumatic            Eyes- Gross vision intact, PERRLA, conjunctivae and secretions clear            Ears- Hearing, canals-normal            Nose- +turbinate edema, no-Septal dev, mucus, polyps, erosion, perforation             Throat- Mallampati III-IV , mucosa- not red,  drainage- none, tonsils- atrophic Neck- flexible , trachea midline, no stridor , thyroid nl, carotid no bruit Chest - symmetrical excursion , unlabored           Heart/CV- RR/ very faint (no pacemaker) , no murmur , no gallop  , no rub, nl  s1 s2                  - JVD- none , edema- none, stasis changes- none, varices- none           Lung- + trace crackle,  Wheeze-none , cough-none,                         dullness-none, rub- none           Chest wall-  Abd- Br/ Gen/ Rectal- Not done, not indicated Extrem- cyanosis- none, clubbing, none, atrophy- none, strength- nl Neuro- grossly intact to observation

## 2019-02-07 NOTE — Assessment & Plan Note (Signed)
Now on doxycycline. Still some L maxillary full-feeling, no discharge.

## 2019-02-13 ENCOUNTER — Telehealth: Payer: Self-pay | Admitting: Internal Medicine

## 2019-02-13 DIAGNOSIS — I10 Essential (primary) hypertension: Secondary | ICD-10-CM | POA: Diagnosis not present

## 2019-02-13 DIAGNOSIS — Z713 Dietary counseling and surveillance: Secondary | ICD-10-CM | POA: Diagnosis not present

## 2019-02-13 DIAGNOSIS — I4891 Unspecified atrial fibrillation: Secondary | ICD-10-CM | POA: Diagnosis not present

## 2019-02-13 DIAGNOSIS — Z6835 Body mass index (BMI) 35.0-35.9, adult: Secondary | ICD-10-CM | POA: Diagnosis not present

## 2019-02-13 DIAGNOSIS — Z299 Encounter for prophylactic measures, unspecified: Secondary | ICD-10-CM | POA: Diagnosis not present

## 2019-02-13 NOTE — Telephone Encounter (Signed)
Nucala Order: 100mg  #1 Vial Order Date: 02/13/19 Expected date of arrival: 02/14/19 Ordered by: Lattie Haw, LPN Specialty Pharmacy: [facility]

## 2019-02-14 NOTE — Telephone Encounter (Signed)
Nucala Shipment Received: 100mg  #1 vial Medication arrival date: 02/14/19 Lot #: [id number] Exp date: 01/28/2022 Received by: Elliot Dally

## 2019-02-15 DIAGNOSIS — M9901 Segmental and somatic dysfunction of cervical region: Secondary | ICD-10-CM | POA: Diagnosis not present

## 2019-02-15 DIAGNOSIS — M545 Low back pain: Secondary | ICD-10-CM | POA: Diagnosis not present

## 2019-02-15 DIAGNOSIS — M9902 Segmental and somatic dysfunction of thoracic region: Secondary | ICD-10-CM | POA: Diagnosis not present

## 2019-02-15 DIAGNOSIS — M542 Cervicalgia: Secondary | ICD-10-CM | POA: Diagnosis not present

## 2019-02-15 DIAGNOSIS — M9905 Segmental and somatic dysfunction of pelvic region: Secondary | ICD-10-CM | POA: Diagnosis not present

## 2019-02-15 DIAGNOSIS — M546 Pain in thoracic spine: Secondary | ICD-10-CM | POA: Diagnosis not present

## 2019-02-15 DIAGNOSIS — M9903 Segmental and somatic dysfunction of lumbar region: Secondary | ICD-10-CM | POA: Diagnosis not present

## 2019-02-16 ENCOUNTER — Other Ambulatory Visit: Payer: Self-pay

## 2019-02-16 ENCOUNTER — Ambulatory Visit (HOSPITAL_COMMUNITY)
Admission: RE | Admit: 2019-02-16 | Discharge: 2019-02-16 | Disposition: A | Discharge status: Home / Self Care | Payer: Medicare Other | Source: Ambulatory Visit | Attending: Nurse Practitioner | Admitting: Nurse Practitioner

## 2019-02-16 ENCOUNTER — Encounter (HOSPITAL_COMMUNITY): Payer: Self-pay | Admitting: Nurse Practitioner

## 2019-02-16 VITALS — BP 128/50 | HR 49 | Ht 63.5 in | Wt 191.0 lb

## 2019-02-16 DIAGNOSIS — Z809 Family history of malignant neoplasm, unspecified: Secondary | ICD-10-CM | POA: Diagnosis not present

## 2019-02-16 DIAGNOSIS — I4819 Other persistent atrial fibrillation: Secondary | ICD-10-CM | POA: Insufficient documentation

## 2019-02-16 DIAGNOSIS — Z8249 Family history of ischemic heart disease and other diseases of the circulatory system: Secondary | ICD-10-CM | POA: Diagnosis not present

## 2019-02-16 DIAGNOSIS — E785 Hyperlipidemia, unspecified: Secondary | ICD-10-CM | POA: Insufficient documentation

## 2019-02-16 DIAGNOSIS — Z800 Family history of malignant neoplasm of digestive organs: Secondary | ICD-10-CM | POA: Diagnosis not present

## 2019-02-16 DIAGNOSIS — Z6833 Body mass index (BMI) 33.0-33.9, adult: Secondary | ICD-10-CM | POA: Insufficient documentation

## 2019-02-16 DIAGNOSIS — R001 Bradycardia, unspecified: Secondary | ICD-10-CM | POA: Diagnosis not present

## 2019-02-16 DIAGNOSIS — I444 Left anterior fascicular block: Secondary | ICD-10-CM | POA: Diagnosis not present

## 2019-02-16 DIAGNOSIS — I10 Essential (primary) hypertension: Secondary | ICD-10-CM | POA: Diagnosis not present

## 2019-02-16 DIAGNOSIS — Z7901 Long term (current) use of anticoagulants: Secondary | ICD-10-CM | POA: Diagnosis not present

## 2019-02-16 DIAGNOSIS — Z888 Allergy status to other drugs, medicaments and biological substances status: Secondary | ICD-10-CM | POA: Diagnosis not present

## 2019-02-16 DIAGNOSIS — J449 Chronic obstructive pulmonary disease, unspecified: Secondary | ICD-10-CM | POA: Insufficient documentation

## 2019-02-16 DIAGNOSIS — Z79899 Other long term (current) drug therapy: Secondary | ICD-10-CM | POA: Insufficient documentation

## 2019-02-16 DIAGNOSIS — R9431 Abnormal electrocardiogram [ECG] [EKG]: Secondary | ICD-10-CM | POA: Insufficient documentation

## 2019-02-16 DIAGNOSIS — I4892 Unspecified atrial flutter: Secondary | ICD-10-CM | POA: Insufficient documentation

## 2019-02-16 DIAGNOSIS — G4733 Obstructive sleep apnea (adult) (pediatric): Secondary | ICD-10-CM | POA: Diagnosis not present

## 2019-02-16 DIAGNOSIS — Z96641 Presence of right artificial hip joint: Secondary | ICD-10-CM | POA: Diagnosis not present

## 2019-02-16 DIAGNOSIS — D6869 Other thrombophilia: Secondary | ICD-10-CM | POA: Diagnosis not present

## 2019-02-16 DIAGNOSIS — E669 Obesity, unspecified: Secondary | ICD-10-CM | POA: Insufficient documentation

## 2019-02-16 LAB — BASIC METABOLIC PANEL
Anion gap: 8 (ref 5–15)
BUN: 11 mg/dL (ref 8–23)
CO2: 22 mmol/L (ref 22–32)
Calcium: 9.1 mg/dL (ref 8.9–10.3)
Chloride: 109 mmol/L (ref 98–111)
Creatinine, Ser: 1.03 mg/dL — ABNORMAL HIGH (ref 0.44–1.00)
GFR calc Af Amer: 60 mL/min — ABNORMAL LOW (ref >60)
GFR calc non Af Amer: 52 mL/min — ABNORMAL LOW (ref >60)
Glucose, Bld: 100 mg/dL — ABNORMAL HIGH (ref 70–99)
Potassium: 4.1 mmol/L (ref 3.5–5.1)
Sodium: 139 mmol/L (ref 135–145)

## 2019-02-16 LAB — MAGNESIUM: Magnesium: 2.1 mg/dL (ref 1.7–2.4)

## 2019-02-16 NOTE — Progress Notes (Signed)
Primary Care Physician: Monico Blitz, MD Referring Physician: Dr. [name] is a 78 y.o. female with a h/o COPD, HTN, OSA, atrial flutter and paroxysmal afib tht has been persistent for several weeks. She was seen  the afib clinic for admission for Tikosyn. She failed flecainide, many cardioversion's in the past.  She is no longer on HCTZ.  F/u in afib clinic one week after Tikosyn loading. She is Sinus brady today at HR of 49 bpm, but at home HR ins in the 50's  Which is her norm. She feels improved on tikosyn being  in rhythm.  F/u in afib clinic 8/21. She had a few off days lat week and wondered if she was in afib. The only change in her health is that she started Trilogy inhaler for her asthma and had to be treated for thrush, She has now stopped inhaler. She is in rhythm today. She has started Pulmonology rehab and is pleased that she is having more energy and is able to do activities that she was able to do before.  F/u in afib clinic 7/27 for Tikosyn surveillance. Her Ekg shows SR with qtc per EKG read out at 532 ms. I asked Dr. Rayann Heman to look at EKG and he feels qtc is closer to  500 ms and does not recommend any change int Tikosyn dose. She has had some recent afib, one night as her power went out and she got hot. She currently has has a UTI and is on Macrodantin . This as well may have stirred up some afib recently.  F/u afib clinic, 11/19, pt is in for tikosyn surveillance. Feels some afib, short term, but not like she had before Tikosyn, which would usually require cardioversion.  Feels he has been doing very well with  her lungs being more stable as well. HR 49 bpm in the office in SR but had home HR's in the 50's/60's.   Today, she denies symptoms of palpitations, chest pain, shortness of breath, orthopnea, PND, lower extremity edema, dizziness, presyncope, syncope, or neurologic sequela. The patient is tolerating medications without difficulties and is otherwise  without complaint today.   Past Medical History:  Diagnosis Date   Allergic rhinitis    Anxiety    Asthma    Atrial flutter (HCC)    COPD (chronic obstructive pulmonary disease) ([place])    Depression    Essential hypertension    Hyperlipidemia    Lumbar disc disease    Obesity    OSA (obstructive sleep apnea)    CPAP   Paroxysmal atrial fibrillation (HCC)    Element of tachycardia bradycardia syndrome   Respiratory failure ([place])    Past Surgical History:  Procedure Laterality Date   ANTERIOR FUSION LUMBAR SPINE     BIOPSY  08/27/2016   Procedure: BIOPSY;  Surgeon: Rogene Houston, MD;  [facility];  Service: Endoscopy;;  FOUR GASTRIC POLYPS BIOPSIED   CARDIOVERSION N/A 03/28/2014   Procedure: CARDIOVERSION;  Surgeon: Fay Records, MD;  Location: [facility];  Service: Cardiovascular;  Laterality: N/A;   CARDIOVERSION N/A 10/22/2016   Procedure: CARDIOVERSION;  Surgeon: Sueanne Margarita, MD;  Location: [place];  Service: Cardiovascular;  Laterality: N/A;   CATARACT EXTRACTION     COLONOSCOPY N/A 08/04/2012   Procedure: COLONOSCOPY;  Surgeon: Rogene Houston, MD;  [facility];  Service: Endoscopy;  Laterality: N/A;  730-rescheduled to 1030 Ann notified pt   ELECTROPHYSIOLOGIC STUDY N/A  09/18/2014   Procedure: Atrial Fibrillation Ablation;  Surgeon: Thompson Grayer, MD;  Location: [place] [facility];  Service: Cardiovascular;  Laterality: N/A;   ESOPHAGOGASTRODUODENOSCOPY N/A 08/27/2016   Procedure: ESOPHAGOGASTRODUODENOSCOPY (EGD);  Surgeon: Rogene Houston, MD;  [facility];  Service: Endoscopy;  Laterality: N/A;  1200   LASIK     Both eyes   TEE WITHOUT CARDIOVERSION N/A 09/18/2014   Procedure: TRANSESOPHAGEAL ECHOCARDIOGRAM (TEE);  Surgeon: Larey Dresser, MD;  Location: [place];  Service: Cardiovascular;  Laterality: N/A;   TEE WITHOUT CARDIOVERSION N/A 10/22/2016   Procedure: TRANSESOPHAGEAL ECHOCARDIOGRAM (TEE);   Surgeon: Sueanne Margarita, MD;  Location: [facility]COPY;  Service: Cardiovascular;  Laterality: N/A;   TEE WITHOUT CARDIOVERSION N/A 08/24/2017   Procedure: TRANSESOPHAGEAL ECHOCARDIOGRAM (TEE);  Surgeon: Josue Hector, MD;  Location: [facility] ENDOSCOPY;  Service: Cardiovascular;  Laterality: N/A;   TOTAL HIP ARTHROPLASTY  2010   Right    Current Outpatient Medications  Medication Sig Dispense Refill   acetaminophen (TYLENOL) 500 MG tablet Take 500 mg by mouth daily as needed for headache.     albuterol (PROAIR HFA) 108 (90 Base) MCG/ACT inhaler 2 puffs every 6 hours for wheezing as needed 1 Inhaler 12   antiseptic oral rinse (BIOTENE) LIQD 15 mLs by Mouth Rinse route as needed for dry mouth.     atorvastatin (LIPITOR) 10 MG tablet Take 10 mg by mouth every evening.      baclofen (LIORESAL) 10 MG tablet Take 10 mg by mouth as needed for muscle spasms.      benzonatate (TESSALON) 200 MG capsule Take 1 capsule (200 mg total) by mouth 3 (three) times daily as needed for cough. 30 capsule 3   Biotin 1 MG CAPS Take 1,000 mg by mouth 3 (three) times a week.      budesonide (PULMICORT) 0.5 MG/2ML nebulizer solution Take 0.5 mg by nebulization daily.      Calcium Carbonate-Vit D-Min (QC CALCIUM-MAGNESIUM-ZINC-D3) 333.4-133 MG-UNIT TABS Take 1 tablet by mouth 2 (two) times daily.      cetirizine (ZYRTEC) 10 MG tablet Take 10 mg by mouth daily as needed for allergies.     clindamycin (CLEOCIN) 300 MG capsule 2 capsules as needed. Take prior to dental procedures  0   Dextromethorphan-guaiFENesin (MUCINEX DM) 30-600 MG TB12 Take 1-2 tablets by mouth as needed.      diltiazem (CARDIZEM) 30 MG tablet Take 1 tablet (30 mg total) by mouth as needed (for palpitations). 45 tablet 2   diltiazem (CARTIA XT) 120 MG 24 hr capsule Take 1 capsule (120 mg total) by mouth at bedtime. 90 capsule 2   dofetilide (TIKOSYN) 500 MCG capsule Take 1 capsule (500 mcg total) by mouth 2 (two) times daily. 180 capsule 1     famotidine (PEPCID) 20 MG tablet Take 40 mg by mouth daily.      FLUAD 0.5 ML SUSY Inject as directed See admin instructions.     fluticasone (FLONASE) 50 MCG/ACT nasal spray Place 1 spray into both nostrils 2 (two) times daily.     hydrALAZINE (APRESOLINE) 25 MG tablet Take 25 mg by mouth daily.     HYDROcodone-homatropine (HYDROMET) 5-1.5 MG/5ML syrup Take 5 mLs by mouth every 6 (six) hours as needed for cough. 120 mL 0   ipratropium-albuterol (DUONEB) 0.5-2.5 (3) MG/3ML SOLN Take 3 mLs by nebulization daily.      losartan (COZAAR) 100 MG tablet Take 1 tablet (100 mg total) by mouth daily. 90 tablet  2   Magnesium 250 MG TABS Take 250 mg by mouth daily.     montelukast (SINGULAIR) 10 MG tablet Take 10 mg by mouth at bedtime.     pantoprazole (PROTONIX) 40 MG tablet Take 40 mg by mouth daily.     potassium chloride SA (KLOR-CON) 20 MEQ tablet Take 1 tablet (20 mEq total) by mouth daily. 90 tablet 2   sodium chloride (OCEAN) 0.65 % SOLN nasal spray Place 1 spray into both nostrils 2 (two) times daily.      triamcinolone ointment (KENALOG) 0.1 % Apply 1 application topically daily as needed for rash.  2   umeclidinium-vilanterol (ANORO ELLIPTA) 62.5-25 MCG/INH AEPB Inhale 1 puff into the lungs daily. 1 each 0   warfarin (COUMADIN) 5 MG tablet Take 5 mg by mouth as directed.     WARFARIN SODIUM PO Take by mouth as directed. Patient is taking 5 mg once da ily, 2.5 mg on Monday & Friday.     Wheat Dextrin (BENEFIBER ON THE GO) POWD Take 1 Dose by mouth daily as needed (constipation). 2 tsp daily in the morning.     Current Facility-Administered Medications  Medication Dose Route Frequency Provider Last Rate Last Dose   Mepolizumab SOLR 100 mg  100 mg Subcutaneous Q28 days Baird Lyons D, MD   100 mg at 11/30/18 0941   Mepolizumab SOLR 100 mg  100 mg Subcutaneous Q28 days Baird Lyons D, MD   100 mg at 01/25/19 [id number]    Allergies  Allergen Reactions   Bupropion Hcl Other  (See Comments)    Suicidal Thoughts.    Escitalopram Oxalate Other (See Comments)    Suicidal Thoughts.    Fluticasone-Salmeterol Other (See Comments)    Advair - Caused patient to go into Afib.    Lisinopril Cough   Serevent Other (See Comments)    Caused patient to go into Afib    Social History   Socioeconomic History   Marital status: Single    Spouse name: Not on file   Number of children: Not on file   Years of education: Not on file   Highest education level: Not on file  Occupational History   Occupation: Retired: Presenter, broadcasting  Social Designer, fashion/clothing strain: Not on file   Food insecurity    Worry: Not on file    Inability: Not on file   Transportation needs    Medical: Not on file    Non-medical: Not on file  Tobacco Use   Smoking status: Never Smoker   Smokeless tobacco: Never Used  Substance and Sexual Activity   Alcohol use: No    Alcohol/week: 0.0 standard drinks   Drug use: No   Sexual activity: Not on file  Lifestyle   Physical activity    Days per week: Not on file    Minutes per session: Not on file   Stress: Not on file  Relationships   Social connections    Talks on phone: Not on file    Gets together: Not on file    Attends religious service: Not on file    Active member of club or organization: Not on file    Attends meetings of clubs or organizations: Not on file    Relationship status: Not on file   Intimate partner violence    Fear of current or ex partner: Not on file    Emotionally abused: Not on file    Physically abused: Not on file  Forced sexual activity: Not on file  Other Topics Concern   Not on file  Social History Narrative   Pt lives in [place] alone. She was never married.   Retired Customer service manager.   Attends [employer]    Family History  Problem Relation Age of Onset   Cancer Mother    Heart attack Father    Colon cancer Other     ROS- All systems are  reviewed and negative except as per the HPI above  Physical Exam: Vitals:   02/16/19 1101  Weight: 86.6 kg  Height: 5' 3.5" (1.613 m)   Wt Readings from Last 3 Encounters:  02/16/19 86.6 kg  02/07/19 86.2 kg  11/01/18 84.6 kg    Labs: Lab Results  Component Value Date   NA 141 11/02/2018   K 4.2 11/02/2018   CL 107 11/02/2018   CO2 26 11/02/2018   GLUCOSE 90 11/02/2018   BUN 14 11/02/2018   CREATININE 0.87 11/02/2018   CALCIUM 9.3 11/02/2018   MG 2.0 10/24/2018   Lab Results  Component Value Date   INR 2.59 08/27/2017   No results found for: CHOL, HDL, LDLCALC, TRIG   GEN- The patient is well appearing, alert and oriented x 3 today.   Head- normocephalic, atraumatic Eyes-  Sclera clear, conjunctiva pink Ears- hearing intact Oropharynx- clear Neck- supple, no JVP Lymph- no cervical lymphadenopathy Lungs- Clear to ausculation bilaterally, normal work of breathing Heart- regular rate and rhythm, no murmurs, rubs or gallops, PMI not laterally displaced GI- soft, NT, ND, + BS Extremities- no clubbing, cyanosis, or edema MS- no significant deformity or atrophy Skin- no rash or lesion Psych- euthymic mood, full affect Neuro- strength and sensation are intact  EKG-  Sinus brady at 49 bpm, pr int 190 ms, qrs int 90 ms, qtc 440 ms     Assessment and Plan: 1. Persistent  afib Has failed flecainide Doing well  with Tikosyn  with rare breakthrough  Continue Tikosyn at 500 mcg bid ( qt  stable) Continue warfarin for chadsvasc score of 4 Continue Cartia XT bmet/mag   2. HTN Stable   3. Asthma Stable per pt on current inhalers/drugs   F/u here in 6 months    Butch Penny C. Chrisha Vogel, [facility] [address] [place], [place] [address] [phone]

## 2019-02-22 ENCOUNTER — Ambulatory Visit (INDEPENDENT_AMBULATORY_CARE_PROVIDER_SITE_OTHER): Payer: Medicare Other

## 2019-02-22 ENCOUNTER — Other Ambulatory Visit: Payer: Self-pay

## 2019-02-22 DIAGNOSIS — J4550 Severe persistent asthma, uncomplicated: Secondary | ICD-10-CM

## 2019-02-22 MED ORDER — MEPOLIZUMAB 100 MG ~~LOC~~ SOLR
100.0000 mg | SUBCUTANEOUS | Status: DC
  Administered 2019-02-22: 100 mg via SUBCUTANEOUS

## 2019-02-22 NOTE — Progress Notes (Signed)
All questions were answered by the patient before medication was administered. Have you been hospitalized in the last 10 days? No Do you have a fever? No Do you have a cough? No Do you have a headache or sore throat? No  Len Blalock, CMA

## 2019-03-13 ENCOUNTER — Telehealth: Payer: Self-pay | Admitting: Internal Medicine

## 2019-03-13 NOTE — Telephone Encounter (Signed)
Nucala Order: 100mg  #1 Vial Order Date: 03/13/2019 Expected date of arrival: 03/14/2019 Ordered by: Desmond Dike, [place]  Specialty Pharmacy: [facility]

## 2019-03-14 NOTE — Telephone Encounter (Signed)
Nucala Shipment Received: 100mg  #1 vial Medication arrival date: 03/14/2019 Lot #: [id number] Exp date: 09/2020 Received by: Desmond Dike, [place]

## 2019-03-22 ENCOUNTER — Other Ambulatory Visit: Payer: Self-pay

## 2019-03-22 ENCOUNTER — Ambulatory Visit (INDEPENDENT_AMBULATORY_CARE_PROVIDER_SITE_OTHER): Payer: Medicare Other

## 2019-03-22 DIAGNOSIS — J4550 Severe persistent asthma, uncomplicated: Secondary | ICD-10-CM

## 2019-03-22 MED ORDER — MEPOLIZUMAB 100 MG ~~LOC~~ SOLR
100.0000 mg | SUBCUTANEOUS | Status: DC
  Administered 2019-03-22: 100 mg via SUBCUTANEOUS

## 2019-03-22 NOTE — Progress Notes (Signed)
All questions were answered by the patient before medication was administered. Have you been hospitalized in the last 10 days? No Do you have a fever? No Do you have a cough? No Do you have a headache or sore throat? No  

## 2019-04-05 DIAGNOSIS — M9902 Segmental and somatic dysfunction of thoracic region: Secondary | ICD-10-CM | POA: Diagnosis not present

## 2019-04-05 DIAGNOSIS — M9905 Segmental and somatic dysfunction of pelvic region: Secondary | ICD-10-CM | POA: Diagnosis not present

## 2019-04-05 DIAGNOSIS — M9901 Segmental and somatic dysfunction of cervical region: Secondary | ICD-10-CM | POA: Diagnosis not present

## 2019-04-05 DIAGNOSIS — M545 Low back pain: Secondary | ICD-10-CM | POA: Diagnosis not present

## 2019-04-05 DIAGNOSIS — M542 Cervicalgia: Secondary | ICD-10-CM | POA: Diagnosis not present

## 2019-04-05 DIAGNOSIS — M9903 Segmental and somatic dysfunction of lumbar region: Secondary | ICD-10-CM | POA: Diagnosis not present

## 2019-04-05 DIAGNOSIS — M546 Pain in thoracic spine: Secondary | ICD-10-CM | POA: Diagnosis not present

## 2019-04-06 DIAGNOSIS — K219 Gastro-esophageal reflux disease without esophagitis: Secondary | ICD-10-CM | POA: Diagnosis not present

## 2019-04-06 DIAGNOSIS — I10 Essential (primary) hypertension: Secondary | ICD-10-CM | POA: Diagnosis not present

## 2019-04-06 DIAGNOSIS — Z789 Other specified health status: Secondary | ICD-10-CM | POA: Diagnosis not present

## 2019-04-06 DIAGNOSIS — I4891 Unspecified atrial fibrillation: Secondary | ICD-10-CM | POA: Diagnosis not present

## 2019-04-06 DIAGNOSIS — J449 Chronic obstructive pulmonary disease, unspecified: Secondary | ICD-10-CM | POA: Diagnosis not present

## 2019-04-06 DIAGNOSIS — Z299 Encounter for prophylactic measures, unspecified: Secondary | ICD-10-CM | POA: Diagnosis not present

## 2019-04-06 DIAGNOSIS — Z6835 Body mass index (BMI) 35.0-35.9, adult: Secondary | ICD-10-CM | POA: Diagnosis not present

## 2019-04-10 ENCOUNTER — Telehealth: Payer: Self-pay | Admitting: Internal Medicine

## 2019-04-10 NOTE — Telephone Encounter (Signed)
Nucala Order: 100mg  #1 Vial Order Date: 04/10/2019 Expected date of arrival: 04/11/19 Ordered by: [place]: [facility]

## 2019-04-11 DIAGNOSIS — Z23 Encounter for immunization: Secondary | ICD-10-CM | POA: Diagnosis not present

## 2019-04-11 NOTE — Telephone Encounter (Signed)
Nucala Shipment Received: 100mg  #1 vial Medication arrival date: 04/11/19 Lot #: [id number] Exp date: 07/28/2020 Received by: Elliot Dally

## 2019-04-12 DIAGNOSIS — Z6834 Body mass index (BMI) 34.0-34.9, adult: Secondary | ICD-10-CM | POA: Diagnosis not present

## 2019-04-12 DIAGNOSIS — I10 Essential (primary) hypertension: Secondary | ICD-10-CM | POA: Diagnosis not present

## 2019-04-12 DIAGNOSIS — Z789 Other specified health status: Secondary | ICD-10-CM | POA: Diagnosis not present

## 2019-04-12 DIAGNOSIS — I4891 Unspecified atrial fibrillation: Secondary | ICD-10-CM | POA: Diagnosis not present

## 2019-04-12 DIAGNOSIS — Z299 Encounter for prophylactic measures, unspecified: Secondary | ICD-10-CM | POA: Diagnosis not present

## 2019-04-13 NOTE — Progress Notes (Signed)
Virtual Visit via Telephone Note   This visit type was conducted due to national recommendations for restrictions regarding the COVID-19 Pandemic (e.g. social distancing) in an effort to limit this patient's exposure and mitigate transmission in our community.  Due to her co-morbid illnesses, this patient is at least at moderate risk for complications without adequate follow up.  This format is felt to be most appropriate for this patient at this time.  The patient did not have access to video technology/had technical difficulties with video requiring transitioning to audio format only (telephone).  All issues noted in this document were discussed and addressed.  No physical exam could be performed with this format.  Please refer to the patient's chart for her  consent to telehealth for [facility].   Date:  04/14/2019   ID:  [name] [name], DOB 1940/08/04, MRN [id number]  Patient Location: Home Provider Location: Home  PCP:  Monico Blitz, MD  Cardiologist:  Rozann Lesches, MD Electrophysiologist:  Thompson Grayer, MD   Evaluation Performed:  Follow-Up Visit  Chief Complaint:  Cardiac follow-up  History of Present Illness:    [name] [name] is a 79 y.o. female last assessed via telehealth encounter in August 2020.  She had interval follow-up in the atrial fibrillation clinic in November 2020.  We spoke by phone today.  She tells that she has had a few episodes of breakthrough atrial fibrillation and took short acting Cardizem with improvement, generally satisfied with her rhythm control on present regimen.  She has not been as active but is trying to increase her walking and also work on diet.  I reviewed her medications as outlined below.  Cardiac regimen is stable including Cartia XT 120 mg daily, Tikosyn, and Coumadin.  She has short acting Cardizem to take for breakthrough palpitations.  I reviewed her recent lab work and ECG from November 2020.  She has been having trouble  with elevated blood pressure, working on this with Dr. Manuella Ghazi who recently increased her hydralazine.  The patient does not have symptoms concerning for COVID-19 infection (fever, chills, cough, or new shortness of breath).  She just got her first vaccine dose on Tuesday.   Past Medical History:  Diagnosis Date   Allergic rhinitis    Anxiety    Asthma    Atrial flutter (HCC)    COPD (chronic obstructive pulmonary disease) ([place])    Depression    Essential hypertension    Hyperlipidemia    Lumbar disc disease    Obesity    OSA (obstructive sleep apnea)    CPAP   Paroxysmal atrial fibrillation (HCC)    Element of tachycardia bradycardia syndrome   Respiratory failure ([place])    Past Surgical History:  Procedure Laterality Date   ANTERIOR FUSION LUMBAR SPINE     BIOPSY  08/27/2016   Procedure: BIOPSY;  Surgeon: Rogene Houston, MD;  [facility];  Service: Endoscopy;;  FOUR GASTRIC POLYPS BIOPSIED   CARDIOVERSION N/A 03/28/2014   Procedure: CARDIOVERSION;  Surgeon: Fay Records, MD;  [facility];  Service: Cardiovascular;  Laterality: N/A;   CARDIOVERSION N/A 10/22/2016   Procedure: CARDIOVERSION;  Surgeon: Sueanne Margarita, MD;  Location: [place];  Service: Cardiovascular;  Laterality: N/A;   CATARACT EXTRACTION     COLONOSCOPY N/A 08/04/2012   Procedure: COLONOSCOPY;  Surgeon: Rogene Houston, MD;  [facility];  Service: Endoscopy;  Laterality: N/A;  730-rescheduled to 1030 Ann notified pt   ELECTROPHYSIOLOGIC  STUDY N/A 09/18/2014   Procedure: Atrial Fibrillation Ablation;  Surgeon: Thompson Grayer, MD;  Location: [place] [facility] LAB;  Service: Cardiovascular;  Laterality: N/A;   ESOPHAGOGASTRODUODENOSCOPY N/A 08/27/2016   Procedure: ESOPHAGOGASTRODUODENOSCOPY (EGD);  Surgeon: Rogene Houston, MD;  [facility];  Service: Endoscopy;  Laterality: N/A;  1200   LASIK     Both eyes   TEE WITHOUT CARDIOVERSION N/A 09/18/2014    Procedure: TRANSESOPHAGEAL ECHOCARDIOGRAM (TEE);  Surgeon: Larey Dresser, MD;  Location: [place];  Service: Cardiovascular;  Laterality: N/A;   TEE WITHOUT CARDIOVERSION N/A 10/22/2016   Procedure: TRANSESOPHAGEAL ECHOCARDIOGRAM (TEE);  Surgeon: Sueanne Margarita, MD;  [facility] ENDOSCOPY;  Service: Cardiovascular;  Laterality: N/A;   TEE WITHOUT CARDIOVERSION N/A 08/24/2017   Procedure: TRANSESOPHAGEAL ECHOCARDIOGRAM (TEE);  Surgeon: Josue Hector, MD;  Location: [facility] ENDOSCOPY;  Service: Cardiovascular;  Laterality: N/A;   TOTAL HIP ARTHROPLASTY  2010   Right     Current Meds  Medication Sig   acetaminophen (TYLENOL) 500 MG tablet Take 500 mg by mouth daily as needed for headache.   albuterol (PROAIR HFA) 108 (90 Base) MCG/ACT inhaler 2 puffs every 6 hours for wheezing as needed   antiseptic oral rinse (BIOTENE) LIQD 15 mLs by Mouth Rinse route as needed for dry mouth.   atorvastatin (LIPITOR) 10 MG tablet Take 10 mg by mouth every evening.    baclofen (LIORESAL) 10 MG tablet Take 10 mg by mouth as needed for muscle spasms.    benzonatate (TESSALON) 200 MG capsule Take 1 capsule (200 mg total) by mouth 3 (three) times daily as needed for cough.   Biotin 1 MG CAPS Take 1,000 mg by mouth 3 (three) times a week.    budesonide (PULMICORT) 0.5 MG/2ML nebulizer solution Take 0.5 mg by nebulization daily.    Calcium Carbonate-Vit D-Min (QC CALCIUM-MAGNESIUM-ZINC-D3) 333.4-133 MG-UNIT TABS Take 1 tablet by mouth 2 (two) times daily.    cetirizine (ZYRTEC) 10 MG tablet Take 10 mg by mouth daily as needed for allergies.   clindamycin (CLEOCIN) 300 MG capsule 2 capsules as needed. Take prior to dental procedures   Dextromethorphan-guaiFENesin (MUCINEX DM) 30-600 MG TB12 Take 1-2 tablets by mouth as needed.    diltiazem (CARDIZEM) 30 MG tablet Take 1 tablet (30 mg total) by mouth as needed (for palpitations).   diltiazem (CARTIA XT) 120 MG 24 hr capsule Take 1 capsule (120 mg  total) by mouth at bedtime.   dofetilide (TIKOSYN) 500 MCG capsule Take 1 capsule (500 mcg total) by mouth 2 (two) times daily.   famotidine (PEPCID) 20 MG tablet Take 40 mg by mouth daily.    FLUAD 0.5 ML SUSY Inject as directed See admin instructions.   fluticasone (FLONASE) 50 MCG/ACT nasal spray Place 1 spray into both nostrils 2 (two) times daily.   hydrALAZINE (APRESOLINE) 50 MG tablet Take 50 mg by mouth 2 (two) times daily.   HYDROcodone-homatropine (HYDROMET) 5-1.5 MG/5ML syrup Take 5 mLs by mouth every 6 (six) hours as needed for cough.   ipratropium-albuterol (DUONEB) 0.5-2.5 (3) MG/3ML SOLN Take 3 mLs by nebulization daily.    Magnesium 250 MG TABS Take 250 mg by mouth daily.   montelukast (SINGULAIR) 10 MG tablet Take 10 mg by mouth at bedtime.   pantoprazole (PROTONIX) 40 MG tablet Take 40 mg by mouth daily.   potassium chloride SA (KLOR-CON) 20 MEQ tablet Take 1 tablet (20 mEq total) by mouth daily.   sodium chloride (OCEAN) 0.65 %  SOLN nasal spray Place 1 spray into both nostrils 2 (two) times daily.    triamcinolone ointment (KENALOG) 0.1 % Apply 1 application topically daily as needed for rash.   umeclidinium-vilanterol (ANORO ELLIPTA) 62.5-25 MCG/INH AEPB Inhale 1 puff into the lungs daily.   warfarin (COUMADIN) 5 MG tablet Take 5 mg by mouth as directed.   WARFARIN SODIUM PO Take by mouth as directed. Patient is taking 5 mg once da ily, 2.5 mg on Monday & Friday.   Wheat Dextrin (BENEFIBER ON THE GO) POWD Take 1 Dose by mouth daily as needed (constipation). 2 tsp daily in the morning.   Current Facility-Administered Medications for the 04/14/19 encounter (Telemedicine) with Satira Sark, MD  Medication   Mepolizumab SOLR 100 mg   Mepolizumab SOLR 100 mg   Mepolizumab SOLR 100 mg   Mepolizumab SOLR 100 mg     Allergies:   Bupropion hcl, Escitalopram oxalate, Fluticasone-salmeterol, Lisinopril, and Serevent   Social History   Tobacco Use    Smoking status: Never Smoker   Smokeless tobacco: Never Used  Substance Use Topics   Alcohol use: No    Alcohol/week: 0.0 standard drinks   Drug use: No     Family Hx: The patient's family history includes Cancer in her mother; Colon cancer in an other family member; Heart attack in her father.  ROS:   Please see the history of present illness.    Arthritic pains. All other systems reviewed and are negative.   Prior CV studies:   The following studies were reviewed today:  TEE 08/24/2017: Study Conclusions  - Left ventricle: Systolic function was normal. The estimated   ejection fraction was in the range of 55% to 60%. - Mitral valve: There was mild regurgitation. - Left atrium: The atrium was dilated. No evidence of thrombus in   the atrial cavity or appendage. No evidence of thrombus in the   appendage. - Right atrium: No evidence of thrombus in the atrial cavity or   appendage. - Atrial septum: No defect or patent foramen ovale was identified. - Tricuspid valve: There was mild-moderate regurgitation. - Impressions: Patient had TEE prior to Tikosyn load to r/o LAA   thrombus   No thrombus present     Moderate sedation : 30 minutes 5 mg versed 50 ug of fentanyl     3D rendering of atrial septum and mitral valve performed  Impressions:  - Patient had TEE prior to Tikosyn load to r/o LAA thrombus   No thrombus present     Moderate sedation : 30 minutes 5 mg versed 50 ug of fentanyl     3D rendering of atrial septum and mitral valve performed No   cardiac source of emboli was indentified.  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  Recent Labs: 06/02/2018: Hemoglobin 11.3; Platelets 167.0 02/16/2019: BUN 11; Creatinine, Ser 1.03; Magnesium 2.1; Potassium 4.1; Sodium 139    Wt Readings from Last 3 Encounters:    04/14/19 185 lb 8 oz (84.1 kg)  02/16/19 191 lb (86.6 kg)  02/07/19 190 lb (86.2 kg)     Objective:    Vital Signs:  BP (!) 172/83    Pulse (!) 59    Ht 5' 3.5" (1.613 m)    Wt 185 lb 8 oz (84.1 kg)    BMI 32.34 kg/m  Patient spoke in full sentences, not short of breath. No audible wheezing or coughing. Speech pattern normal.  ASSESSMENT & PLAN:    1.  Persistent atrial fibrillation.  She does describe a few breakthrough episodes which respond to short acting Cardizem, but overall seems satisfied with rhythm control.  Continue Tikosyn, Cartia XT, and Coumadin.  If she does not have follow-up lab work and ECG with interval visits in the AF clinic, we will obtain at her next office visit.  2.  Essential hypertension, blood pressure trend has been up.  She is working on this with Dr. Manuella Ghazi.  Hydralazine dose just increased.  Next step would be increasing to 3 times daily.  3.  OSA on CPAP.  She reports compliance.  4.  Tachycardia-bradycardia syndrome.  No syncope.  Has stable bradycardia in sinus rhythm.  Continue with observation.  COVID-19 Education: The signs and symptoms of COVID-19 were discussed with the patient and how to seek care for testing (follow up with PCP or arrange E-visit).  The importance of social distancing was discussed today.  Time:   Today, I have spent 8 minutes with the patient with telehealth technology discussing the above problems.     Medication Adjustments/Labs and Tests Ordered: Current medicines are reviewed at length with the patient today.  Concerns regarding medicines are outlined above.   Tests Ordered: No orders of the defined types were placed in this encounter.   Medication Changes: No orders of the defined types were placed in this encounter.   Follow Up:  In Person 6 months in the [place] office.  Signed, Rozann Lesches, MD  04/14/2019 9:17 AM    [place]

## 2019-04-14 ENCOUNTER — Telehealth (INDEPENDENT_AMBULATORY_CARE_PROVIDER_SITE_OTHER): Payer: Medicare Other | Admitting: Cardiology

## 2019-04-14 ENCOUNTER — Encounter: Payer: Self-pay | Admitting: Cardiology

## 2019-04-14 VITALS — BP 172/83 | HR 59 | Ht 63.5 in | Wt 185.5 lb

## 2019-04-14 DIAGNOSIS — G4733 Obstructive sleep apnea (adult) (pediatric): Secondary | ICD-10-CM | POA: Diagnosis not present

## 2019-04-14 DIAGNOSIS — I4819 Other persistent atrial fibrillation: Secondary | ICD-10-CM

## 2019-04-14 DIAGNOSIS — I495 Sick sinus syndrome: Secondary | ICD-10-CM

## 2019-04-14 DIAGNOSIS — I10 Essential (primary) hypertension: Secondary | ICD-10-CM

## 2019-04-14 NOTE — Patient Instructions (Signed)
Medication Instructions:  Your physician recommends that you continue on your current medications as directed. Please refer to the Current Medication list given to you today.  *If you need a refill on your cardiac medications before your next appointment, please call your pharmacy*  Lab Work: NONE If you have labs (blood work) drawn today and your tests are completely normal, you will receive your results only by: . MyChart Message (if you have MyChart) OR . A paper copy in the mail If you have any lab test that is abnormal or we need to change your treatment, we will call you to review the results.  Testing/Procedures: NONE  Follow-Up: At [facility], you and your health needs are our priority.  As part of our continuing mission to provide you with exceptional heart care, we have created designated Provider Care Teams.  These Care Teams include your primary Cardiologist (physician) and Advanced Practice Providers (APPs -  Physician Assistants and Nurse Practitioners) who all work together to provide you with the care you need, when you need it.  Your next appointment:   6 month(s)  The format for your next appointment:   In Person  Provider:   Samuel McDowell, MD  Other Instructions NONE      Thank you for choosing [facility] !         

## 2019-04-19 ENCOUNTER — Ambulatory Visit (INDEPENDENT_AMBULATORY_CARE_PROVIDER_SITE_OTHER): Payer: Medicare Other

## 2019-04-19 ENCOUNTER — Other Ambulatory Visit: Payer: Self-pay

## 2019-04-19 DIAGNOSIS — J4550 Severe persistent asthma, uncomplicated: Secondary | ICD-10-CM | POA: Diagnosis not present

## 2019-04-19 MED ORDER — MEPOLIZUMAB 100 MG ~~LOC~~ SOLR
100.0000 mg | SUBCUTANEOUS | Status: DC
  Administered 2019-04-19: 100 mg via SUBCUTANEOUS

## 2019-04-19 NOTE — Progress Notes (Signed)
Have you been hospitalized within the last 10 days?  No Do you have a fever?  No Do you have a cough?  No Do you have a headache or sore throat? No Do you have your Epi Pen visible and is it within date?  Yes 

## 2019-04-21 DIAGNOSIS — M9901 Segmental and somatic dysfunction of cervical region: Secondary | ICD-10-CM | POA: Diagnosis not present

## 2019-04-21 DIAGNOSIS — M9903 Segmental and somatic dysfunction of lumbar region: Secondary | ICD-10-CM | POA: Diagnosis not present

## 2019-04-21 DIAGNOSIS — M546 Pain in thoracic spine: Secondary | ICD-10-CM | POA: Diagnosis not present

## 2019-04-21 DIAGNOSIS — M9902 Segmental and somatic dysfunction of thoracic region: Secondary | ICD-10-CM | POA: Diagnosis not present

## 2019-04-21 DIAGNOSIS — M545 Low back pain: Secondary | ICD-10-CM | POA: Diagnosis not present

## 2019-04-21 DIAGNOSIS — M542 Cervicalgia: Secondary | ICD-10-CM | POA: Diagnosis not present

## 2019-04-21 DIAGNOSIS — M9905 Segmental and somatic dysfunction of pelvic region: Secondary | ICD-10-CM | POA: Diagnosis not present

## 2019-04-24 ENCOUNTER — Encounter (INDEPENDENT_AMBULATORY_CARE_PROVIDER_SITE_OTHER): Payer: Self-pay

## 2019-04-26 DIAGNOSIS — I10 Essential (primary) hypertension: Secondary | ICD-10-CM | POA: Diagnosis not present

## 2019-04-26 DIAGNOSIS — Z6834 Body mass index (BMI) 34.0-34.9, adult: Secondary | ICD-10-CM | POA: Diagnosis not present

## 2019-04-26 DIAGNOSIS — Z299 Encounter for prophylactic measures, unspecified: Secondary | ICD-10-CM | POA: Diagnosis not present

## 2019-04-26 DIAGNOSIS — Z789 Other specified health status: Secondary | ICD-10-CM | POA: Diagnosis not present

## 2019-04-26 DIAGNOSIS — I4891 Unspecified atrial fibrillation: Secondary | ICD-10-CM | POA: Diagnosis not present

## 2019-04-28 DIAGNOSIS — I10 Essential (primary) hypertension: Secondary | ICD-10-CM | POA: Diagnosis not present

## 2019-05-02 ENCOUNTER — Ambulatory Visit (INDEPENDENT_AMBULATORY_CARE_PROVIDER_SITE_OTHER): Payer: Medicare Other | Admitting: Internal Medicine

## 2019-05-02 ENCOUNTER — Encounter (INDEPENDENT_AMBULATORY_CARE_PROVIDER_SITE_OTHER): Payer: Self-pay | Admitting: Internal Medicine

## 2019-05-02 ENCOUNTER — Other Ambulatory Visit: Payer: Self-pay

## 2019-05-02 VITALS — BP 161/72 | HR 52 | Temp 97.3°F | Ht 63.0 in | Wt 189.3 lb

## 2019-05-02 DIAGNOSIS — K219 Gastro-esophageal reflux disease without esophagitis: Secondary | ICD-10-CM

## 2019-05-02 MED ORDER — FAMOTIDINE 20 MG PO TABS: 40.0000 mg | ORAL_TABLET | Freq: Every day | ORAL | 3 refills | Status: DC

## 2019-05-02 MED ORDER — PANTOPRAZOLE SODIUM 40 MG PO TBEC: 40.0000 mg | DELAYED_RELEASE_TABLET | Freq: Every day | ORAL | 3 refills | Status: DC

## 2019-05-02 NOTE — Patient Instructions (Signed)
Please call office of pantoprazole and famotidine not effective anymore.

## 2019-05-02 NOTE — Progress Notes (Signed)
Presenting complaint;  Follow for chronic GERD.  Database and subjective:  Patient is 79 year old Caucasian female who has chronic GERD.  Last EGD was in May 2018 revealing small sliding hiatal hernia fundic and hyperplastic gastric polyps who has been maintained on a PPI and H2 B who is here for scheduled visit.  She was last seen 1 year ago.  She states she is doing well.  She says she rarely experiences heartburn.  Her usual symptoms of fear of her cough and throat burning.  She denies nausea vomiting or dysphagia.  She recalls she had a bad flareup with burning in her throat or cough after eating food from [place].  She has stopped doing this.  She also recalls having an episode of chest pain while she was at dentist office and lying almost flat.  She went there to have a filling.  Procedure was canceled.  She sat up and the pain went away.  She did not have shortness of breath palpitation or diaphoresis.  She recalls she went soon after eating her breakfast.  Lately she has noted sometimes she passes hard stool.  She says she used to be on Benefiber which made a big difference but then she was not able to find it on the stores.  Her appetite is good.  She has lost 5 pounds. She says last year was not a good year.  She had surgery on left big toe for hallux valgus and March 2020.  She had to be reoperated in April 2020 because pains dislodge.  She had to have plate and screws.  She was found to have osteoporosis and has been on Reclast.  She is planned to have surgery on her other toe. She is not having any side effects with pantoprazole or famotidine.   Current Medications: Outpatient Encounter Medications as of 05/02/2019  Medication Sig  . albuterol (PROAIR HFA) 108 (90 Base) MCG/ACT inhaler 2 puffs every 6 hours for wheezing as needed  . antiseptic oral rinse (BIOTENE) LIQD 15 mLs by Mouth Rinse route as needed for dry mouth.  Marland Kitchen atorvastatin (LIPITOR) 10 MG tablet Take 10 mg by mouth every  evening.   . baclofen (LIORESAL) 10 MG tablet Take 10 mg by mouth as needed for muscle spasms.   . benzonatate (TESSALON) 200 MG capsule Take 1 capsule (200 mg total) by mouth 3 (three) times daily as needed for cough.  . Biotin 1 MG CAPS Take 1,000 mg by mouth 3 (three) times a week.   . budesonide (PULMICORT) 0.5 MG/2ML nebulizer solution Take 0.5 mg by nebulization daily.   . Calcium Carbonate-Vit D-Min (QC CALCIUM-MAGNESIUM-ZINC-D3) 333.4-133 MG-UNIT TABS Take 1 tablet by mouth 2 (two) times daily.   . cetirizine (ZYRTEC) 10 MG tablet Take 10 mg by mouth daily as needed for allergies.  . clindamycin (CLEOCIN) 300 MG capsule 2 capsules as needed. Take prior to dental procedures  . Dextromethorphan-guaiFENesin (MUCINEX DM) 30-600 MG TB12 Take 1-2 tablets by mouth as needed.   . diltiazem (CARDIZEM) 30 MG tablet Take 1 tablet (30 mg total) by mouth as needed (for palpitations).  Marland Kitchen diltiazem (CARTIA XT) 120 MG 24 hr capsule Take 1 capsule (120 mg total) by mouth at bedtime.  . dofetilide (TIKOSYN) 500 MCG capsule Take 1 capsule (500 mcg total) by mouth 2 (two) times daily.  . famotidine (PEPCID) 20 MG tablet Take 40 mg by mouth daily.   Marland Kitchen FLUAD 0.5 ML SUSY Inject as directed See admin instructions.  Marland Kitchen  fluticasone (FLONASE) 50 MCG/ACT nasal spray Place 1 spray into both nostrils 2 (two) times daily.  . hydrALAZINE (APRESOLINE) 50 MG tablet Take 50 mg by mouth 2 (two) times daily.  Marland Kitchen HYDROcodone-homatropine (HYDROMET) 5-1.5 MG/5ML syrup Take 5 mLs by mouth every 6 (six) hours as needed for cough.  Marland Kitchen ipratropium-albuterol (DUONEB) 0.5-2.5 (3) MG/3ML SOLN Take 3 mLs by nebulization daily.   Marland Kitchen losartan (COZAAR) 100 MG tablet Take 1 tablet (100 mg total) by mouth daily.  . Magnesium 250 MG TABS Take 250 mg by mouth daily.  . montelukast (SINGULAIR) 10 MG tablet Take 10 mg by mouth at bedtime.  . pantoprazole (PROTONIX) 40 MG tablet Take 40 mg by mouth daily.  . potassium chloride SA (KLOR-CON) 20 MEQ  tablet Take 1 tablet (20 mEq total) by mouth daily.  . sodium chloride (OCEAN) 0.65 % SOLN nasal spray Place 1 spray into both nostrils 2 (two) times daily.   Marland Kitchen triamcinolone ointment (KENALOG) 0.1 % Apply 1 application topically daily as needed for rash.  . umeclidinium-vilanterol (ANORO ELLIPTA) 62.5-25 MCG/INH AEPB Inhale 1 puff into the lungs daily.  Marland Kitchen warfarin (COUMADIN) 5 MG tablet Take 5 mg by mouth as directed.  . Wheat Dextrin (BENEFIBER ON THE GO) POWD Take 1 Dose by mouth daily as needed (constipation). 2 tsp daily in the morning.  Marland Kitchen acetaminophen (TYLENOL) 500 MG tablet Take 500 mg by mouth daily as needed for headache.  . WARFARIN SODIUM PO Take by mouth as directed. Patient is taking 5 mg once da ily, 2.5 mg on Monday & Friday.   Facility-Administered Encounter Medications as of 05/02/2019  Medication  . Mepolizumab SOLR 100 mg  . Mepolizumab SOLR 100 mg  . Mepolizumab SOLR 100 mg  . Mepolizumab SOLR 100 mg  . Mepolizumab SOLR 100 mg    Objective: Blood pressure (!) 161/72, pulse (!) 52, temperature (!) 97.3 F (36.3 C), temperature source Temporal, height 5\' 3"  (1.6 m), weight 189 lb 4.8 oz (85.9 kg). Patient is alert and in no acute distress. Patient is wearing facial mask. Conjunctiva is pink. Sclera is nonicteric Oropharyngeal mucosa is normal. No neck masses or thyromegaly noted. Cardiac exam with regular rhythm normal S1 and S2. No murmur or gallop noted. Lungs are clear to auscultation. Abdomen is full but soft and nontender with organomegaly or masses. No LE edema or clubbing noted.  Assessment:  #1.  Chronic GERD with typical and atypical presentation.  Most of her symptoms include cough and burning in her throat.  She does well as as long as she is watching her diet but she needs combination of PPI and H2B.  Aggressive therapy is warranted otherwise she begins to have bronchospasm.  Plan:  New prescription given for pantoprazole 40 mg p.o. every morning and  famotidine 40 mg p.o. nightly for 90 days with 3 refills. Patient advised to go back on Benefiber 4 g by mouth daily at bedtime. Office visit in 1 year.

## 2019-05-08 ENCOUNTER — Telehealth: Payer: Self-pay | Admitting: Internal Medicine

## 2019-05-08 DIAGNOSIS — M546 Pain in thoracic spine: Secondary | ICD-10-CM | POA: Diagnosis not present

## 2019-05-08 DIAGNOSIS — M545 Low back pain: Secondary | ICD-10-CM | POA: Diagnosis not present

## 2019-05-08 DIAGNOSIS — M9903 Segmental and somatic dysfunction of lumbar region: Secondary | ICD-10-CM | POA: Diagnosis not present

## 2019-05-08 DIAGNOSIS — M9902 Segmental and somatic dysfunction of thoracic region: Secondary | ICD-10-CM | POA: Diagnosis not present

## 2019-05-08 DIAGNOSIS — M9905 Segmental and somatic dysfunction of pelvic region: Secondary | ICD-10-CM | POA: Diagnosis not present

## 2019-05-08 DIAGNOSIS — M542 Cervicalgia: Secondary | ICD-10-CM | POA: Diagnosis not present

## 2019-05-08 DIAGNOSIS — M9901 Segmental and somatic dysfunction of cervical region: Secondary | ICD-10-CM | POA: Diagnosis not present

## 2019-05-08 NOTE — Telephone Encounter (Signed)
Nucala Order: 100mg  #1 Vial Order Date: 05/08/19 Expected date of arrival: 2/29/21 Ordered by: [place]: [facility]

## 2019-05-09 NOTE — Telephone Encounter (Signed)
Nucala Shipment Received: 100mg  #1 vial Medication arrival date: 05/09/19 Lot #: [id number] Exp date: 04/30/2022 Received by: Elliot Dally

## 2019-05-11 DIAGNOSIS — I4891 Unspecified atrial fibrillation: Secondary | ICD-10-CM | POA: Diagnosis not present

## 2019-05-11 DIAGNOSIS — Z6834 Body mass index (BMI) 34.0-34.9, adult: Secondary | ICD-10-CM | POA: Diagnosis not present

## 2019-05-11 DIAGNOSIS — Z789 Other specified health status: Secondary | ICD-10-CM | POA: Diagnosis not present

## 2019-05-11 DIAGNOSIS — I10 Essential (primary) hypertension: Secondary | ICD-10-CM | POA: Diagnosis not present

## 2019-05-11 DIAGNOSIS — Z299 Encounter for prophylactic measures, unspecified: Secondary | ICD-10-CM | POA: Diagnosis not present

## 2019-05-16 DIAGNOSIS — M8580 Other specified disorders of bone density and structure, unspecified site: Secondary | ICD-10-CM | POA: Diagnosis not present

## 2019-05-17 ENCOUNTER — Ambulatory Visit (INDEPENDENT_AMBULATORY_CARE_PROVIDER_SITE_OTHER): Payer: Medicare Other

## 2019-05-17 ENCOUNTER — Other Ambulatory Visit: Payer: Self-pay

## 2019-05-17 DIAGNOSIS — J4550 Severe persistent asthma, uncomplicated: Secondary | ICD-10-CM

## 2019-05-17 MED ORDER — MEPOLIZUMAB 100 MG ~~LOC~~ SOLR
100.0000 mg | Freq: Once | SUBCUTANEOUS | Status: AC
  Administered 2019-05-17: 100 mg via SUBCUTANEOUS

## 2019-05-17 NOTE — Progress Notes (Signed)
Have you been hospitalized within the last 10 days?  No Do you have a fever?  No Do you have a cough?  No Do you have a headache or sore throat? No  

## 2019-05-22 DIAGNOSIS — Z23 Encounter for immunization: Secondary | ICD-10-CM | POA: Diagnosis not present

## 2019-05-28 DIAGNOSIS — I10 Essential (primary) hypertension: Secondary | ICD-10-CM | POA: Diagnosis not present

## 2019-05-31 ENCOUNTER — Other Ambulatory Visit (HOSPITAL_COMMUNITY): Payer: Self-pay | Admitting: *Deleted

## 2019-05-31 DIAGNOSIS — M9903 Segmental and somatic dysfunction of lumbar region: Secondary | ICD-10-CM | POA: Diagnosis not present

## 2019-05-31 DIAGNOSIS — M9901 Segmental and somatic dysfunction of cervical region: Secondary | ICD-10-CM | POA: Diagnosis not present

## 2019-05-31 DIAGNOSIS — M546 Pain in thoracic spine: Secondary | ICD-10-CM | POA: Diagnosis not present

## 2019-05-31 DIAGNOSIS — M545 Low back pain: Secondary | ICD-10-CM | POA: Diagnosis not present

## 2019-05-31 DIAGNOSIS — M9902 Segmental and somatic dysfunction of thoracic region: Secondary | ICD-10-CM | POA: Diagnosis not present

## 2019-05-31 DIAGNOSIS — M542 Cervicalgia: Secondary | ICD-10-CM | POA: Diagnosis not present

## 2019-05-31 DIAGNOSIS — M9905 Segmental and somatic dysfunction of pelvic region: Secondary | ICD-10-CM | POA: Diagnosis not present

## 2019-05-31 MED ORDER — LOSARTAN POTASSIUM 100 MG PO TABS: 100.0000 mg | ORAL_TABLET | Freq: Every day | ORAL | 2 refills | Status: DC

## 2019-06-05 ENCOUNTER — Other Ambulatory Visit (HOSPITAL_COMMUNITY): Payer: Self-pay

## 2019-06-05 ENCOUNTER — Telehealth: Payer: Self-pay | Admitting: Internal Medicine

## 2019-06-05 MED ORDER — DOFETILIDE 500 MCG PO CAPS: 500.0000 ug | ORAL_CAPSULE | Freq: Two times a day (BID) | ORAL | 1 refills | Status: DC

## 2019-06-05 NOTE — Telephone Encounter (Signed)
Nucala Order: 100mg  #1 Vial Order Date: 06/05/2019 Expected date of arrival: 06/06/2019 Ordered by: Desmond Dike, [place]  Specialty Pharmacy: [facility]

## 2019-06-06 NOTE — Telephone Encounter (Signed)
Nucala Shipment Received: 100mg  #1 vial Medication arrival date: 06/06/2019 Lot #: [id number] Exp date: 08/2022 Received by: Desmond Dike, [place]

## 2019-06-07 DIAGNOSIS — Z96643 Presence of artificial hip joint, bilateral: Secondary | ICD-10-CM | POA: Diagnosis not present

## 2019-06-07 DIAGNOSIS — Z4789 Encounter for other orthopedic aftercare: Secondary | ICD-10-CM | POA: Diagnosis not present

## 2019-06-07 DIAGNOSIS — Z471 Aftercare following joint replacement surgery: Secondary | ICD-10-CM | POA: Diagnosis not present

## 2019-06-08 DIAGNOSIS — G4733 Obstructive sleep apnea (adult) (pediatric): Secondary | ICD-10-CM | POA: Diagnosis not present

## 2019-06-08 DIAGNOSIS — I4891 Unspecified atrial fibrillation: Secondary | ICD-10-CM | POA: Diagnosis not present

## 2019-06-08 DIAGNOSIS — D692 Other nonthrombocytopenic purpura: Secondary | ICD-10-CM | POA: Diagnosis not present

## 2019-06-08 DIAGNOSIS — I10 Essential (primary) hypertension: Secondary | ICD-10-CM | POA: Diagnosis not present

## 2019-06-08 DIAGNOSIS — Z789 Other specified health status: Secondary | ICD-10-CM | POA: Diagnosis not present

## 2019-06-08 DIAGNOSIS — Z6835 Body mass index (BMI) 35.0-35.9, adult: Secondary | ICD-10-CM | POA: Diagnosis not present

## 2019-06-08 DIAGNOSIS — Z299 Encounter for prophylactic measures, unspecified: Secondary | ICD-10-CM | POA: Diagnosis not present

## 2019-06-14 ENCOUNTER — Other Ambulatory Visit: Payer: Self-pay

## 2019-06-14 ENCOUNTER — Ambulatory Visit (INDEPENDENT_AMBULATORY_CARE_PROVIDER_SITE_OTHER): Payer: Medicare Other

## 2019-06-14 DIAGNOSIS — J4550 Severe persistent asthma, uncomplicated: Secondary | ICD-10-CM | POA: Diagnosis not present

## 2019-06-14 MED ORDER — MEPOLIZUMAB 100 MG ~~LOC~~ SOLR
100.0000 mg | SUBCUTANEOUS | Status: DC
  Administered 2019-06-14: 100 mg via SUBCUTANEOUS

## 2019-06-14 NOTE — Progress Notes (Signed)
All questions were answered by the patient before medication was administered. Have you been hospitalized in the last 10 days? No Do you have a fever? No Do you have a cough? No Do you have a headache or sore throat? No  

## 2019-06-19 ENCOUNTER — Other Ambulatory Visit (HOSPITAL_COMMUNITY): Payer: Self-pay | Admitting: Internal Medicine

## 2019-06-19 DIAGNOSIS — Z1231 Encounter for screening mammogram for malignant neoplasm of breast: Secondary | ICD-10-CM

## 2019-06-20 ENCOUNTER — Telehealth: Payer: Self-pay | Admitting: Internal Medicine

## 2019-06-20 NOTE — Telephone Encounter (Signed)
Called and spoke with pt. Stated to her if she wanted to bring the patient assistance paperwork to our office, we would make sure all was taken care of. Pt verbalized understanding. Nothing further needed.

## 2019-06-21 ENCOUNTER — Telehealth: Payer: Self-pay | Admitting: Internal Medicine

## 2019-06-21 ENCOUNTER — Other Ambulatory Visit: Payer: Self-pay | Admitting: Internal Medicine

## 2019-06-21 MED ORDER — ANORO ELLIPTA 62.5-25 MCG/INH IN AEPB: 1.0000 | INHALATION_SPRAY | Freq: Every day | RESPIRATORY_TRACT | 0 refills | Status: DC

## 2019-06-21 MED ORDER — ANORO ELLIPTA 62.5-25 MCG/INH IN AEPB: 1.0000 | INHALATION_SPRAY | Freq: Every day | RESPIRATORY_TRACT | 3 refills | Status: DC

## 2019-06-21 NOTE — Telephone Encounter (Signed)
Called and spoke to pt. Pt is requesting Anoro samples and states she will be by later to drop off pt assistance forms. Anoro samples have been left up front for pick up. Will leave encounter open to follow up on pt assistance forms.

## 2019-06-21 NOTE — Telephone Encounter (Signed)
Pt came by office to drop off forms. Forms have been given to [facility] to follow. Needs script printed and signed by CY before forms are faxed.

## 2019-06-22 NOTE — Telephone Encounter (Signed)
Per Horris Latino papers have been signed and faxed this morning. She also made a copy and mailed to patient. Nothing further needed at this time.

## 2019-06-27 DIAGNOSIS — I10 Essential (primary) hypertension: Secondary | ICD-10-CM | POA: Diagnosis not present

## 2019-07-03 ENCOUNTER — Telehealth: Payer: Self-pay | Admitting: Internal Medicine

## 2019-07-03 ENCOUNTER — Other Ambulatory Visit: Payer: Self-pay

## 2019-07-03 ENCOUNTER — Ambulatory Visit (HOSPITAL_COMMUNITY)
Admission: RE | Admit: 2019-07-03 | Discharge: 2019-07-03 | Disposition: A | Discharge status: Home / Self Care | Payer: Medicare Other | Source: Ambulatory Visit | Attending: Internal Medicine | Admitting: Internal Medicine

## 2019-07-03 DIAGNOSIS — Z1231 Encounter for screening mammogram for malignant neoplasm of breast: Secondary | ICD-10-CM | POA: Diagnosis not present

## 2019-07-03 NOTE — Telephone Encounter (Signed)
Nucala Order: 100mg #1 Vial Order Date: 07/03/2019 Expected date of arrival: 07/04/2019 Ordered by: Eriyonna Matsushita, CMA  Specialty Pharmacy: [facility]  

## 2019-07-04 NOTE — Telephone Encounter (Signed)
Nucala Shipment Received: 100mg #1 vial Medication arrival date: 07/04/2019 Lot #: [id number] Exp date: 08/2022 Received by: Gryffin Altice, CMA   

## 2019-07-06 DIAGNOSIS — I10 Essential (primary) hypertension: Secondary | ICD-10-CM | POA: Diagnosis not present

## 2019-07-06 DIAGNOSIS — Z6835 Body mass index (BMI) 35.0-35.9, adult: Secondary | ICD-10-CM | POA: Diagnosis not present

## 2019-07-06 DIAGNOSIS — Z299 Encounter for prophylactic measures, unspecified: Secondary | ICD-10-CM | POA: Diagnosis not present

## 2019-07-06 DIAGNOSIS — Z713 Dietary counseling and surveillance: Secondary | ICD-10-CM | POA: Diagnosis not present

## 2019-07-06 DIAGNOSIS — I4891 Unspecified atrial fibrillation: Secondary | ICD-10-CM | POA: Diagnosis not present

## 2019-07-10 DIAGNOSIS — M545 Low back pain: Secondary | ICD-10-CM | POA: Diagnosis not present

## 2019-07-10 DIAGNOSIS — M9903 Segmental and somatic dysfunction of lumbar region: Secondary | ICD-10-CM | POA: Diagnosis not present

## 2019-07-10 DIAGNOSIS — M9905 Segmental and somatic dysfunction of pelvic region: Secondary | ICD-10-CM | POA: Diagnosis not present

## 2019-07-10 DIAGNOSIS — M546 Pain in thoracic spine: Secondary | ICD-10-CM | POA: Diagnosis not present

## 2019-07-10 DIAGNOSIS — M25511 Pain in right shoulder: Secondary | ICD-10-CM | POA: Diagnosis not present

## 2019-07-10 DIAGNOSIS — M9902 Segmental and somatic dysfunction of thoracic region: Secondary | ICD-10-CM | POA: Diagnosis not present

## 2019-07-10 DIAGNOSIS — M542 Cervicalgia: Secondary | ICD-10-CM | POA: Diagnosis not present

## 2019-07-10 DIAGNOSIS — M9901 Segmental and somatic dysfunction of cervical region: Secondary | ICD-10-CM | POA: Diagnosis not present

## 2019-07-10 DIAGNOSIS — M9907 Segmental and somatic dysfunction of upper extremity: Secondary | ICD-10-CM | POA: Diagnosis not present

## 2019-07-12 ENCOUNTER — Ambulatory Visit: Payer: Medicare Other

## 2019-07-12 DIAGNOSIS — L905 Scar conditions and fibrosis of skin: Secondary | ICD-10-CM | POA: Diagnosis not present

## 2019-07-12 DIAGNOSIS — D485 Neoplasm of uncertain behavior of skin: Secondary | ICD-10-CM | POA: Diagnosis not present

## 2019-07-12 DIAGNOSIS — D1801 Hemangioma of skin and subcutaneous tissue: Secondary | ICD-10-CM | POA: Diagnosis not present

## 2019-07-12 DIAGNOSIS — L570 Actinic keratosis: Secondary | ICD-10-CM | POA: Diagnosis not present

## 2019-07-12 DIAGNOSIS — Z85828 Personal history of other malignant neoplasm of skin: Secondary | ICD-10-CM | POA: Diagnosis not present

## 2019-07-12 DIAGNOSIS — L218 Other seborrheic dermatitis: Secondary | ICD-10-CM | POA: Diagnosis not present

## 2019-07-12 DIAGNOSIS — L814 Other melanin hyperpigmentation: Secondary | ICD-10-CM | POA: Diagnosis not present

## 2019-07-12 DIAGNOSIS — B078 Other viral warts: Secondary | ICD-10-CM | POA: Diagnosis not present

## 2019-07-12 DIAGNOSIS — Q828 Other specified congenital malformations of skin: Secondary | ICD-10-CM | POA: Diagnosis not present

## 2019-07-12 DIAGNOSIS — D225 Melanocytic nevi of trunk: Secondary | ICD-10-CM | POA: Diagnosis not present

## 2019-07-12 DIAGNOSIS — L821 Other seborrheic keratosis: Secondary | ICD-10-CM | POA: Diagnosis not present

## 2019-07-13 ENCOUNTER — Ambulatory Visit (INDEPENDENT_AMBULATORY_CARE_PROVIDER_SITE_OTHER): Payer: Medicare Other

## 2019-07-13 ENCOUNTER — Other Ambulatory Visit: Payer: Self-pay

## 2019-07-13 DIAGNOSIS — J4550 Severe persistent asthma, uncomplicated: Secondary | ICD-10-CM

## 2019-07-13 MED ORDER — MEPOLIZUMAB 100 MG ~~LOC~~ SOLR
100.0000 mg | SUBCUTANEOUS | Status: DC
  Administered 2019-07-13: 12:00:00 100 mg via SUBCUTANEOUS

## 2019-07-13 NOTE — Progress Notes (Signed)
All questions were answered by the patient before medication was administered. Have you been hospitalized in the last 10 days? No Do you have a fever? No Do you have a cough? No Do you have a headache or sore throat? No  

## 2019-07-19 DIAGNOSIS — Z1211 Encounter for screening for malignant neoplasm of colon: Secondary | ICD-10-CM | POA: Diagnosis not present

## 2019-07-19 DIAGNOSIS — Z0000 Encounter for general adult medical examination without abnormal findings: Secondary | ICD-10-CM | POA: Diagnosis not present

## 2019-07-19 DIAGNOSIS — Z1339 Encounter for screening examination for other mental health and behavioral disorders: Secondary | ICD-10-CM | POA: Diagnosis not present

## 2019-07-19 DIAGNOSIS — Z6831 Body mass index (BMI) 31.0-31.9, adult: Secondary | ICD-10-CM | POA: Diagnosis not present

## 2019-07-19 DIAGNOSIS — Z7189 Other specified counseling: Secondary | ICD-10-CM | POA: Diagnosis not present

## 2019-07-19 DIAGNOSIS — R5383 Other fatigue: Secondary | ICD-10-CM | POA: Diagnosis not present

## 2019-07-19 DIAGNOSIS — Z1331 Encounter for screening for depression: Secondary | ICD-10-CM | POA: Diagnosis not present

## 2019-07-19 DIAGNOSIS — Z6835 Body mass index (BMI) 35.0-35.9, adult: Secondary | ICD-10-CM | POA: Diagnosis not present

## 2019-07-19 DIAGNOSIS — E7800 Pure hypercholesterolemia, unspecified: Secondary | ICD-10-CM | POA: Diagnosis not present

## 2019-07-19 DIAGNOSIS — Z299 Encounter for prophylactic measures, unspecified: Secondary | ICD-10-CM | POA: Diagnosis not present

## 2019-07-19 DIAGNOSIS — Z79899 Other long term (current) drug therapy: Secondary | ICD-10-CM | POA: Diagnosis not present

## 2019-07-19 DIAGNOSIS — E559 Vitamin D deficiency, unspecified: Secondary | ICD-10-CM | POA: Diagnosis not present

## 2019-07-28 DIAGNOSIS — I10 Essential (primary) hypertension: Secondary | ICD-10-CM | POA: Diagnosis not present

## 2019-07-28 DIAGNOSIS — M9907 Segmental and somatic dysfunction of upper extremity: Secondary | ICD-10-CM | POA: Diagnosis not present

## 2019-07-28 DIAGNOSIS — M546 Pain in thoracic spine: Secondary | ICD-10-CM | POA: Diagnosis not present

## 2019-07-28 DIAGNOSIS — M545 Low back pain: Secondary | ICD-10-CM | POA: Diagnosis not present

## 2019-07-28 DIAGNOSIS — M9905 Segmental and somatic dysfunction of pelvic region: Secondary | ICD-10-CM | POA: Diagnosis not present

## 2019-07-28 DIAGNOSIS — J449 Chronic obstructive pulmonary disease, unspecified: Secondary | ICD-10-CM | POA: Diagnosis not present

## 2019-07-28 DIAGNOSIS — M9902 Segmental and somatic dysfunction of thoracic region: Secondary | ICD-10-CM | POA: Diagnosis not present

## 2019-07-28 DIAGNOSIS — M542 Cervicalgia: Secondary | ICD-10-CM | POA: Diagnosis not present

## 2019-07-28 DIAGNOSIS — M9901 Segmental and somatic dysfunction of cervical region: Secondary | ICD-10-CM | POA: Diagnosis not present

## 2019-07-28 DIAGNOSIS — M25511 Pain in right shoulder: Secondary | ICD-10-CM | POA: Diagnosis not present

## 2019-07-28 DIAGNOSIS — M9903 Segmental and somatic dysfunction of lumbar region: Secondary | ICD-10-CM | POA: Diagnosis not present

## 2019-07-31 ENCOUNTER — Telehealth: Payer: Self-pay | Admitting: Internal Medicine

## 2019-07-31 NOTE — Telephone Encounter (Signed)
Nucala Order: 100mg  #1 Vial Order Date: 07/31/19 Expected date of arrival: 08/01/19 Ordered by: [place]: Nigel Mormon

## 2019-08-01 NOTE — Telephone Encounter (Signed)
Nucala Shipment Received: 100mg  #1 vial Medication arrival date: 08/01/2019 Lot #: [id number] Exp date: 12/2022 Received by: Desmond Dike, [place]

## 2019-08-07 ENCOUNTER — Encounter: Payer: Self-pay | Admitting: Internal Medicine

## 2019-08-07 DIAGNOSIS — I10 Essential (primary) hypertension: Secondary | ICD-10-CM | POA: Diagnosis not present

## 2019-08-07 DIAGNOSIS — L249 Irritant contact dermatitis, unspecified cause: Secondary | ICD-10-CM | POA: Diagnosis not present

## 2019-08-07 DIAGNOSIS — I4891 Unspecified atrial fibrillation: Secondary | ICD-10-CM | POA: Diagnosis not present

## 2019-08-07 DIAGNOSIS — D485 Neoplasm of uncertain behavior of skin: Secondary | ICD-10-CM | POA: Diagnosis not present

## 2019-08-07 DIAGNOSIS — L718 Other rosacea: Secondary | ICD-10-CM | POA: Diagnosis not present

## 2019-08-07 DIAGNOSIS — L218 Other seborrheic dermatitis: Secondary | ICD-10-CM | POA: Diagnosis not present

## 2019-08-07 DIAGNOSIS — Z299 Encounter for prophylactic measures, unspecified: Secondary | ICD-10-CM | POA: Diagnosis not present

## 2019-08-07 DIAGNOSIS — B079 Viral wart, unspecified: Secondary | ICD-10-CM | POA: Diagnosis not present

## 2019-08-07 DIAGNOSIS — J449 Chronic obstructive pulmonary disease, unspecified: Secondary | ICD-10-CM | POA: Diagnosis not present

## 2019-08-07 DIAGNOSIS — G4733 Obstructive sleep apnea (adult) (pediatric): Secondary | ICD-10-CM | POA: Diagnosis not present

## 2019-08-07 LAB — PROTIME-INR

## 2019-08-08 ENCOUNTER — Other Ambulatory Visit: Payer: Self-pay

## 2019-08-08 ENCOUNTER — Encounter (HOSPITAL_COMMUNITY): Payer: Self-pay | Admitting: Nurse Practitioner

## 2019-08-08 ENCOUNTER — Ambulatory Visit (INDEPENDENT_AMBULATORY_CARE_PROVIDER_SITE_OTHER): Payer: Medicare Other

## 2019-08-08 ENCOUNTER — Ambulatory Visit (HOSPITAL_COMMUNITY)
Admission: RE | Admit: 2019-08-08 | Discharge: 2019-08-08 | Disposition: A | Discharge status: Home / Self Care | Payer: Medicare Other | Source: Ambulatory Visit | Attending: Nurse Practitioner | Admitting: Nurse Practitioner

## 2019-08-08 ENCOUNTER — Ambulatory Visit (INDEPENDENT_AMBULATORY_CARE_PROVIDER_SITE_OTHER): Payer: Medicare Other | Admitting: Internal Medicine

## 2019-08-08 ENCOUNTER — Encounter: Payer: Self-pay | Admitting: Internal Medicine

## 2019-08-08 VITALS — BP 150/50 | HR 52 | Ht 63.0 in | Wt 197.6 lb

## 2019-08-08 VITALS — BP 122/64 | HR 44 | Temp 97.9°F | Ht 63.0 in | Wt 195.0 lb

## 2019-08-08 DIAGNOSIS — D6869 Other thrombophilia: Secondary | ICD-10-CM

## 2019-08-08 DIAGNOSIS — J4520 Mild intermittent asthma, uncomplicated: Secondary | ICD-10-CM

## 2019-08-08 DIAGNOSIS — Z79899 Other long term (current) drug therapy: Secondary | ICD-10-CM | POA: Insufficient documentation

## 2019-08-08 DIAGNOSIS — E785 Hyperlipidemia, unspecified: Secondary | ICD-10-CM | POA: Insufficient documentation

## 2019-08-08 DIAGNOSIS — Z888 Allergy status to other drugs, medicaments and biological substances status: Secondary | ICD-10-CM | POA: Insufficient documentation

## 2019-08-08 DIAGNOSIS — I10 Essential (primary) hypertension: Secondary | ICD-10-CM | POA: Diagnosis not present

## 2019-08-08 DIAGNOSIS — Z7901 Long term (current) use of anticoagulants: Secondary | ICD-10-CM | POA: Insufficient documentation

## 2019-08-08 DIAGNOSIS — G4733 Obstructive sleep apnea (adult) (pediatric): Secondary | ICD-10-CM | POA: Insufficient documentation

## 2019-08-08 DIAGNOSIS — I4819 Other persistent atrial fibrillation: Secondary | ICD-10-CM

## 2019-08-08 DIAGNOSIS — J45909 Unspecified asthma, uncomplicated: Secondary | ICD-10-CM | POA: Diagnosis not present

## 2019-08-08 DIAGNOSIS — J449 Chronic obstructive pulmonary disease, unspecified: Secondary | ICD-10-CM | POA: Insufficient documentation

## 2019-08-08 DIAGNOSIS — Z8249 Family history of ischemic heart disease and other diseases of the circulatory system: Secondary | ICD-10-CM | POA: Insufficient documentation

## 2019-08-08 DIAGNOSIS — I4892 Unspecified atrial flutter: Secondary | ICD-10-CM | POA: Insufficient documentation

## 2019-08-08 LAB — MAGNESIUM: Magnesium: 2 mg/dL (ref 1.7–2.4)

## 2019-08-08 MED ORDER — DOFETILIDE 500 MCG PO CAPS: 500.0000 ug | ORAL_CAPSULE | Freq: Two times a day (BID) | ORAL | 2 refills | Status: DC

## 2019-08-08 NOTE — Patient Instructions (Signed)
Order- CXR   Dx Asthma moderate uncomplicated  We can continue current meds  We can continue CPAP 9,  Please call if we can help

## 2019-08-08 NOTE — Assessment & Plan Note (Signed)
Benefits with good compliance andd control Plan- continue CPAP 9

## 2019-08-08 NOTE — Progress Notes (Signed)
HPI female never smoker followed for OSA, allergic rhinitis, asthma, Hyper IgE,  complicated by A. Fib/Coumadin, HBP, GERD, obesity NPSG prior to EMR in 2012 Office Spirometry 09/02/16-WNL. FVC 2.06/76%, FEV1 1.57/77%, ratio 0.76, FEF 25-75% 1.26/80% Allergy labs 09/02/16- eosinophils 200, total IgE 4230 causing elevation of all specific allergen antibody levels including Aspergillus. Nucala  + 2018 PFT-10/13/16-minimal obstruction without response to dilator, minimal reduction of diffusion. FVC 2.02/77%, FEV1 1.62/83%, ratio 0.80, TLC 92%, DLCO 77% CT soft tissue neck 09/22/16-1. 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 Subsegmental atelectasis and nonspecific linear patchy densities in the lungs as described. They have a benign appearance.  Small hyperdense masses in the mediastinum are stable compared with 2015 supporting benign etiology such as ectopic thyroid tissue. Three-vessel prominent coronary artery calcification. Bibasilar bronchiolectasis. Labs 09/20/2017-WBC 14,900, hemoglobin 11.2, eosinophils absent, lymphocytes low( ?steroids or viral?), BNP 220, CMET wnl IgE 04/12/2018- 2,574, 10/16/16- 4,302, 11/09/13- 3,513 Office Spirometry 04/12/2018-mild restriction of exhaled volume.  Possible mild obstructive airways.  FVC 1.9/73%, FEV1 1.4/75%, ratio 1.76, FEF 25-75% 1.2/80%  -------------------------------------------------------------------------------------------   02/07/2019- 79 year old female never smoker followed for OSA, allergic rhinitis, asthma/ Nucala, Hyper IgE, complicated by A. Fib/Coumadin, HBP, GERD, obesity CPAP 9/[facility] -----6 month f/u OSA Proair hfa, Pulmicort neb solu, neb Duoneb, Singulair, flonase, Anoro,  Download compliance 97%, AHI 2.1/ hr Body weight today -190 lbs On doxycycline from PCP for sinusitis.Chest feels stable and breathing controlled. Using neb Duoneb and Pulmicort once  daily. Infrequent rescue. Peak flow runs around 300.  Woke last night in AFib, back in NSR by 4:30 AM.  CPAP ok. Sleeping 10-12 hours/ day.   08/08/19- 79 year old female never smoker followed for OSA, allergic rhinitis, asthma/ Nucala, Hyper IgE, complicated by A. Fib/Coumadin, HBP, GERD, obesity CPAP 9/[facility] 2 Moderna Covax Download compliance 100%, AHI 2.4/ hr Proair hfa, Pulmicort neb solu, neb Duoneb, Singulair, flonase, Anoro, Body weight today 195 lbs    Weight at home 187-191 Has been feeling better this whole year.  Followed at AFib clinic with better control. Cardiac stable. Continues Nucala. She agrees she could give her onw injections if required by insurance.  Comfortable with CPAP- no concerns.   ROS-see HPI   + = positive Constitutional:   No-   weight loss, night sweats, fevers, chills, fatigue, lassitude. HEENT:   No-  headaches, difficulty swallowing, tooth/dental problems, sore throat,       No-  sneezing, itching,  ear ache,  +nasal congestion, post nasal drip,  CV:  No-   chest pain, orthopnea, PND, swelling in lower extremities, anasarca, dizziness, palpitations Resp: +  shortness of breath with exertion or at rest.                productive cough,  + non-productive cough,  No- coughing up of blood.              No-change in color of mucus. + wheezing.   Skin: No-   rash or lesions. GI:  No-   heartburn, indigestion, abdominal pain, nausea, vomiting, GU:  MS:  No-   joint pain or swelling. . Neuro-     nothing unusual Psych:  No- change in mood or affect. No depression or anxiety.  No memory loss.  OBJ- Physical Exam General- Alert, Oriented, Affect-appropriate, Distress- none acute, + obese Skin- rash-none, lesions- none, excoriation- none Lymphadenopathy- none Head- atraumatic            Eyes-  Gross vision intact, PERRLA, conjunctivae and secretions clear            Ears- Hearing, canals-normal            Nose- +turbinate edema, no-Septal  dev, mucus, polyps, erosion, perforation             Throat- Mallampati III-IV , mucosa- not red,  drainage- none, tonsils- atrophic Neck- flexible , trachea midline, no stridor , thyroid nl, carotid no bruit Chest - symmetrical excursion , unlabored           Heart/CV- RR/ very faint (no pacemaker) , no murmur , no gallop  , no rub, nl  s1 s2                  - JVD- none , edema- none, stasis changes- none, varices- none           Lung- + clear  Wheeze-none , cough-none,                         dullness-none, rub- none           Chest wall-  Abd- Br/ Gen/ Rectal- Not done, not indicated Extrem- cyanosis- none, clubbing, none, atrophy- none, strength- nl Neuro- grossly intact to observation

## 2019-08-08 NOTE — Assessment & Plan Note (Signed)
Has tolerated Anoro. Nucala helps minimize need to push stimulant bronchodilators.

## 2019-08-08 NOTE — Progress Notes (Addendum)
Primary Care Physician: Monico Blitz, MD Referring Physician: Dr. [name] is a 79 y.o. female with a h/o COPD, HTN, OSA, atrial flutter and paroxysmal afib tht has been persistent for several weeks. She was seen  the afib clinic for admission for Tikosyn. She failed flecainide, many cardioversion's in the past.  She is no longer on HCTZ.  F/u in afib clinic one week after Tikosyn loading. She is Sinus brady today at HR of 49 bpm, but at home HR ins in the 50's  Which is her norm. She feels improved on tikosyn being  in rhythm.  F/u in afib clinic 8/21. She had a few off days lat week and wondered if she was in afib. The only change in her health is that she started Trilogy inhaler for her asthma and had to be treated for thrush, She has now stopped inhaler. She is in rhythm today. She has started Pulmonology rehab and is pleased that she is having more energy and is able to do activities that she was able to do before.  F/u in afib clinic 7/27 for Tikosyn surveillance. Her Ekg shows SR with qtc per EKG read out at 532 ms. I asked Dr. Rayann Heman to look at EKG and he feels qtc is closer to  500 ms and does not recommend any change int Tikosyn dose. She has had some recent afib, one night as her power went out and she got hot. She currently has has a UTI and is on Macrodantin . This as well may have stirred up some afib recently.  F/u afib clinic, 11/19, pt is in for tikosyn surveillance. Feels some afib, short term, but not like she had before Tikosyn, which would usually require cardioversion.  Feels he has been doing very well with  her lungs being more stable as well. HR 49 bpm in the office in SR but had home HR's in the 50's/60's.   F/u in afib clinic 08/08/19 for tikosyn surveillance. She  has very few breakthrough afib episodes. Using cardizem 30 mg as needed, drug will usually get her back in rhythm within 3-4 hours. Her qtc is stable today. Had recent cbc/bmet with her PCP,  mag to be drawn today.continue dofetilide 500 mg bid and warfarin for a CHA2DS2VASc score of 4. HEr heart rate is usually slow in the 50's, asymptomatic. At home readings reviewed and pulse ranges 50-70's. PCP is having her send in remote BP readings daily.   Today, she denies symptoms of palpitations, chest pain, shortness of breath, orthopnea, PND, lower extremity edema, dizziness, presyncope, syncope, or neurologic sequela. The patient is tolerating medications without difficulties and is otherwise without complaint today.   Past Medical History:  Diagnosis Date  . Allergic rhinitis   . Anxiety   . Asthma   . Atrial flutter ([place])   . COPD (chronic obstructive pulmonary disease) ([place])   . Depression   . Essential hypertension   . Hyperlipidemia   . Lumbar disc disease   . Obesity   . OSA (obstructive sleep apnea)    CPAP  . Paroxysmal atrial fibrillation (HCC)    Element of tachycardia bradycardia syndrome  . Respiratory failure [facility])    Past Surgical History:  Procedure Laterality Date  . ANTERIOR FUSION LUMBAR SPINE    . BIOPSY  08/27/2016   Procedure: BIOPSY;  Surgeon: Rogene Houston, MD;  [facility] ENDO SU[facility];  Service: Endoscopy;;  FOUR GASTRIC POLYPS BIOPSIED  . CARDIOVERSION  N/A 03/28/2014   Procedure: CARDIOVERSION;  Surgeon: Fay Records, MD;  Location: [facility] ORS;  Service: Cardiovascular;  Laterality: N/A;  . CARDIOVERSION N/A 10/22/2016   Procedure: CARDIOVERSION;  Surgeon: Sueanne Margarita, MD;  Location: [facility] ENDOSCOPY;  Service: Cardiovascular;  Laterality: N/A;  . CATARACT EXTRACTION    . COLONOSCOPY N/A 08/04/2012   Procedure: COLONOSCOPY;  Surgeon: Rogene Houston, MD;  [facility]O [facility];  Service: Endoscopy;  Laterality: N/A;  730-rescheduled to [place] notified pt  . ELECTROPHYSIOLOGIC STUDY N/A 09/18/2014   Procedure: Atrial Fibrillation Ablation;  Surgeon: Thompson Grayer, MD;  Location: [facility] CV LAB;  Service: Cardiovascular;  Laterality: N/A;  .  ESOPHAGOGASTRODUODENOSCOPY N/A 08/27/2016   Procedure: ESOPHAGOGASTRODUODENOSCOPY (EGD);  Surgeon: Rogene Houston, MD;  [facility];  Service: Endoscopy;  Laterality: N/A;  1200  . LASIK     Both eyes  . TEE WITHOUT CARDIOVERSION N/A 09/18/2014   Procedure: TRANSESOPHAGEAL ECHOCARDIOGRAM (TEE);  Surgeon: Larey Dresser, MD;  Location: [facility];  Service: Cardiovascular;  Laterality: N/A;  . TEE WITHOUT CARDIOVERSION N/A 10/22/2016   Procedure: TRANSESOPHAGEAL ECHOCARDIOGRAM (TEE);  Surgeon: Sueanne Margarita, MD;  Location: [facility] ENDOSCOPY;  Service: Cardiovascular;  Laterality: N/A;  . TEE WITHOUT CARDIOVERSION N/A 08/24/2017   Procedure: TRANSESOPHAGEAL ECHOCARDIOGRAM (TEE);  Surgeon: Josue Hector, MD;  Location: [facility] ENDOSCOPY;  Service: Cardiovascular;  Laterality: N/A;  . TOTAL HIP ARTHROPLASTY  2010   Right    Current Outpatient Medications  Medication Sig Dispense Refill  . acetaminophen (TYLENOL) 500 MG tablet Take 500 mg by mouth daily as needed for headache.    Marland Kitchen antiseptic oral rinse (BIOTENE) LIQD 15 mLs by Mouth Rinse route as needed for dry mouth.    Marland Kitchen atorvastatin (LIPITOR) 10 MG tablet Take 10 mg by mouth every evening.     . Biotin 1 MG CAPS Take 1,000 mg by mouth 3 (three) times a week.     . budesonide (PULMICORT) 0.5 MG/2ML nebulizer solution Take 0.5 mg by nebulization daily.     . Calcium Carbonate-Vit D-Min (QC CALCIUM-MAGNESIUM-ZINC-D3) 333.4-133 MG-UNIT TABS Take 1 tablet by mouth 2 (two) times daily.     . cetirizine (ZYRTEC) 10 MG tablet Take 10 mg by mouth daily as needed for allergies.    . clindamycin (CLEOCIN) 300 MG capsule 2 capsules as needed. Take prior to dental procedures  0  . diltiazem (CARDIZEM) 30 MG tablet Take 1 tablet (30 mg total) by mouth as needed (for palpitations). 45 tablet 2  . diltiazem (CARTIA XT) 120 MG 24 hr capsule Take 1 capsule (120 mg total) by mouth at bedtime. 90 capsule 2  . dofetilide (TIKOSYN) 500 MCG capsule Take 1  capsule (500 mcg total) by mouth 2 (two) times daily. 180 capsule 2  . famotidine (PEPCID) 20 MG tablet Take 2 tablets (40 mg total) by mouth at bedtime. (Patient taking differently: Take 40 mg by mouth as needed. ) 90 tablet 3  . FLUAD 0.5 ML SUSY Inject as directed See admin instructions.    . fluticasone (FLONASE) 50 MCG/ACT nasal spray Place 1 spray into both nostrils 2 (two) times daily.    . hydrALAZINE (APRESOLINE) 50 MG tablet Take 50 mg by mouth 2 (two) times daily.    Marland Kitchen losartan (COZAAR) 100 MG tablet Take 1 tablet (100 mg total) by mouth daily. 90 tablet 2  . Magnesium 250 MG TABS Take 250 mg by mouth daily.    . montelukast (  SINGULAIR) 10 MG tablet Take 10 mg by mouth at bedtime.    . pantoprazole (PROTONIX) 40 MG tablet Take 1 tablet (40 mg total) by mouth daily before breakfast. 90 tablet 3  . potassium chloride SA (KLOR-CON) 20 MEQ tablet Take 1 tablet (20 mEq total) by mouth daily. 90 tablet 2  . sodium chloride (OCEAN) 0.65 % SOLN nasal spray Place 1 spray into both nostrils 2 (two) times daily.     Marland Kitchen triamcinolone ointment (KENALOG) 0.1 % Apply 1 application topically daily as needed for rash.  2  . umeclidinium-vilanterol (ANORO ELLIPTA) 62.5-25 MCG/INH AEPB Inhale 1 puff into the lungs daily. 180 each 3  . warfarin (COUMADIN) 5 MG tablet Take 5 mg by mouth as directed.    . Wheat Dextrin (BENEFIBER ON THE GO) POWD Take 1 Dose by mouth daily as needed (constipation). 2 tsp daily in the morning.    Marland Kitchen albuterol (PROAIR HFA) 108 (90 Base) MCG/ACT inhaler 2 puffs every 6 hours for wheezing as needed (Patient not taking: Reported on 08/08/2019) 1 Inhaler 12  . benzonatate (TESSALON) 200 MG capsule Take 1 capsule (200 mg total) by mouth 3 (three) times daily as needed for cough. (Patient not taking: Reported on 08/08/2019) 30 capsule 3  . Dextromethorphan-guaiFENesin (MUCINEX DM) 30-600 MG TB12 Take 1-2 tablets by mouth as needed.     Marland Kitchen DOXYCYCLINE PO Take by mouth. Taking one tablet by  mouth daily    . HYDROCODONE-HOMATROPINE PO Taking by mouth prn for cough    . ipratropium-albuterol (DUONEB) 0.5-2.5 (3) MG/3ML SOLN Take 3 mLs by nebulization daily.      Current Facility-Administered Medications  Medication Dose Route Frequency Provider Last Rate Last Admin  . Mepolizumab SOLR 100 mg  100 mg Subcutaneous Q28 days Baird Lyons D, MD   100 mg at 11/30/18 0941  . Mepolizumab SOLR 100 mg  100 mg Subcutaneous Q28 days Baird Lyons D, MD   100 mg at 01/25/19 0957  . Mepolizumab SOLR 100 mg  100 mg Subcutaneous Q28 days Baird Lyons D, MD   100 mg at 02/22/19 1040  . Mepolizumab SOLR 100 mg  100 mg Subcutaneous Q28 days Baird Lyons D, MD   100 mg at 03/22/19 1024  . Mepolizumab SOLR 100 mg  100 mg Subcutaneous Q28 days Baird Lyons D, MD   100 mg at 04/19/19 1045  . Mepolizumab SOLR 100 mg  100 mg Subcutaneous Q28 days Baird Lyons D, MD   100 mg at 06/14/19 1026  . Mepolizumab SOLR 100 mg  100 mg Subcutaneous Q28 days Baird Lyons D, MD   100 mg at 07/13/19 1150    Allergies  Allergen Reactions  . Bupropion Hcl Other (See Comments)    Suicidal Thoughts.   . Escitalopram Oxalate Other (See Comments)    Suicidal Thoughts.   . Fluticasone-Salmeterol Other (See Comments)    Advair - Caused patient to go into Afib.   Marland Kitchen Lisinopril Cough  . Serevent Other (See Comments)    Caused patient to go into Afib    Social History   Socioeconomic History  . Marital status: Single    Spouse name: Not on file  . Number of children: Not on file  . Years of education: Not on file  . Highest education level: Not on file  Occupational History  . Occupation: Retired: Presenter, broadcasting  Tobacco Use  . Smoking status: Never Smoker  . Smokeless tobacco: Never Used  Substance and Sexual Activity  .  Alcohol use: No    Alcohol/week: 0.0 standard drinks  . Drug use: No  . Sexual activity: Not on file  Other Topics Concern  . Not on file  Social History Narrative   Pt  lives in [place] alone. She was never married.   Retired Customer service manager.   Attends [employer]   Social Determinants of Health   Financial Resource Strain:   . Difficulty of Paying Living Expenses:   Food Insecurity:   . Worried About Charity fundraiser in the Last Year:   . Arboriculturist in the Last Year:   Transportation Needs:   . Film/video editor (Medical):   Marland Kitchen Lack of Transportation (Non-Medical):   Physical Activity:   . Days of Exercise per Week:   . Minutes of Exercise per Session:   Stress:   . Feeling of Stress :   Social Connections:   . Frequency of Communication with Friends and Family:   . Frequency of Social Gatherings with Friends and Family:   . Attends Religious Services:   . Active Member of Clubs or Organizations:   . Attends Archivist Meetings:   Marland Kitchen Marital Status:   Intimate Partner Violence:   . Fear of Current or Ex-Partner:   . Emotionally Abused:   Marland Kitchen Physically Abused:   . Sexually Abused:     Family History  Problem Relation Age of Onset  . Cancer Mother   . Heart attack Father   . Colon cancer Other     ROS- All systems are reviewed and negative except as per the HPI above  Physical Exam: Vitals:   08/08/19 1340  BP: (!) 150/50  Pulse: (!) 52  Weight: 89.6 kg  Height: 5\' 3"  (1.6 m)   Wt Readings from Last 3 Encounters:  08/08/19 89.6 kg  08/08/19 88.5 kg  05/02/19 85.9 kg    Labs: Lab Results  Component Value Date   NA 139 02/16/2019   K 4.1 02/16/2019   CL 109 02/16/2019   CO2 22 02/16/2019   GLUCOSE 100 (H) 02/16/2019   BUN 11 02/16/2019   CREATININE 1.03 (H) 02/16/2019   CALCIUM 9.1 02/16/2019   MG 2.1 02/16/2019   Lab Results  Component Value Date   INR 2.59 08/27/2017   No results found for: CHOL, HDL, LDLCALC, TRIG   GEN- The patient is well appearing, alert and oriented x 3 today.   Head- normocephalic, atraumatic Eyes-  Sclera clear, conjunctiva pink Ears- hearing  intact Oropharynx- clear Neck- supple, no JVP Lymph- no cervical lymphadenopathy Lungs- Clear to ausculation bilaterally, normal work of breathing Heart- regular rate and rhythm, no murmurs, rubs or gallops, PMI not laterally displaced GI- soft, NT, ND, + BS Extremities- no clubbing, cyanosis, or edema MS- no significant deformity or atrophy Skin- no rash or lesion Psych- euthymic mood, full affect Neuro- strength and sensation are intact  EKG-  Sinus brady at 52 bpm, pr int 98 ms, qrs int 94 ms, qtc 444 ms     Assessment and Plan: 1. Persistent  afib Has failed flecainide Doing well  with Tikosyn  with rare breakthrough  Continue Tikosyn at 500 mcg bid ( qt  stable) Continue warfarin for chadsvasc score of 4 Continue Cartia XT 120 mg daily amd 30 mg as needed Mag today, bmet and cbc being faxed from  PCP   2. HTN Stable  Per PCP  3. Asthma Stable per pt on current inhalers/drugs  F/u here in 3-4  months   Labs reviewed from  PCP from 07/20/19 show stable CBC, with creatinine at 0.92 and Kt at 5.2   Butch Penny C. Bethzy Hauck, [facility] [address] [place], [place] [id number] [phone]

## 2019-08-08 NOTE — Assessment & Plan Note (Signed)
Exam consistent with NSR or very well regulated AFib at this visit. Followed by cardiology.

## 2019-08-10 ENCOUNTER — Ambulatory Visit (INDEPENDENT_AMBULATORY_CARE_PROVIDER_SITE_OTHER): Payer: Medicare Other

## 2019-08-10 ENCOUNTER — Other Ambulatory Visit: Payer: Self-pay

## 2019-08-10 ENCOUNTER — Telehealth: Payer: Self-pay | Admitting: Internal Medicine

## 2019-08-10 DIAGNOSIS — J4520 Mild intermittent asthma, uncomplicated: Secondary | ICD-10-CM

## 2019-08-10 MED ORDER — MEPOLIZUMAB 100 MG ~~LOC~~ SOLR
100.0000 mg | Freq: Once | SUBCUTANEOUS | Status: AC
  Administered 2019-08-10: 100 mg via SUBCUTANEOUS

## 2019-08-10 NOTE — Progress Notes (Signed)
Have you been hospitalized within the last 10 days?  No Do you have a fever?  No Do you have a cough?  No Do you have a headache or sore throat? No Do you have your Epi Pen visible and is it within date?  Yes 

## 2019-08-10 NOTE — Telephone Encounter (Signed)
Pt was given the results of the cxr during her injection appt today by Lattie Haw. Nothing further needed.

## 2019-08-15 ENCOUNTER — Other Ambulatory Visit: Payer: Self-pay

## 2019-08-15 ENCOUNTER — Ambulatory Visit (INDEPENDENT_AMBULATORY_CARE_PROVIDER_SITE_OTHER): Payer: Medicare Other | Admitting: Podiatry

## 2019-08-15 ENCOUNTER — Ambulatory Visit (INDEPENDENT_AMBULATORY_CARE_PROVIDER_SITE_OTHER): Payer: Medicare Other

## 2019-08-15 DIAGNOSIS — Z7901 Long term (current) use of anticoagulants: Secondary | ICD-10-CM

## 2019-08-15 DIAGNOSIS — M79675 Pain in left toe(s): Secondary | ICD-10-CM

## 2019-08-15 DIAGNOSIS — M79674 Pain in right toe(s): Secondary | ICD-10-CM

## 2019-08-15 DIAGNOSIS — M2042 Other hammer toe(s) (acquired), left foot: Secondary | ICD-10-CM | POA: Diagnosis not present

## 2019-08-15 DIAGNOSIS — M2021 Hallux rigidus, right foot: Secondary | ICD-10-CM | POA: Diagnosis not present

## 2019-08-15 DIAGNOSIS — B351 Tinea unguium: Secondary | ICD-10-CM

## 2019-08-15 DIAGNOSIS — M79672 Pain in left foot: Secondary | ICD-10-CM

## 2019-08-15 DIAGNOSIS — M779 Enthesopathy, unspecified: Secondary | ICD-10-CM | POA: Diagnosis not present

## 2019-08-15 DIAGNOSIS — M2041 Other hammer toe(s) (acquired), right foot: Secondary | ICD-10-CM

## 2019-08-15 DIAGNOSIS — M2141 Flat foot [pes planus] (acquired), right foot: Secondary | ICD-10-CM | POA: Diagnosis not present

## 2019-08-15 DIAGNOSIS — M2142 Flat foot [pes planus] (acquired), left foot: Secondary | ICD-10-CM | POA: Diagnosis not present

## 2019-08-15 DIAGNOSIS — R269 Unspecified abnormalities of gait and mobility: Secondary | ICD-10-CM

## 2019-08-15 DIAGNOSIS — M79671 Pain in right foot: Secondary | ICD-10-CM

## 2019-08-16 ENCOUNTER — Ambulatory Visit (HOSPITAL_COMMUNITY): Payer: Medicare Other | Admitting: Nurse Practitioner

## 2019-08-17 ENCOUNTER — Other Ambulatory Visit: Payer: Self-pay | Admitting: Podiatry

## 2019-08-17 DIAGNOSIS — M2142 Flat foot [pes planus] (acquired), left foot: Secondary | ICD-10-CM

## 2019-08-17 DIAGNOSIS — M2141 Flat foot [pes planus] (acquired), right foot: Secondary | ICD-10-CM

## 2019-08-22 ENCOUNTER — Telehealth: Payer: Self-pay | Admitting: *Deleted

## 2019-08-22 NOTE — Progress Notes (Signed)
Subjective:   Patient ID: [name] [name], female   DOB: 79 y.o.   MRN: [id number]   HPI 79 year old female presents the office today for concerns of bilateral foot pain. She states that she feels that she has flatfeet. She is interested in orthotics. She has a history of left first MPJ arthrodesis. She states that the right first toe is constantly flexed. She states that she knows that she needs to have surgery in the right big toe joint but she is not ready yet to do so. Also states that she is been having some gait changes. No recent falls. Also asking for nails be trimmed today's are thickened elongated.   Review of Systems  All other systems reviewed and are negative.  Past Medical History:  Diagnosis Date  . Allergic rhinitis   . Anxiety   . Asthma   . Atrial flutter ([place])   . COPD (chronic obstructive pulmonary disease) ([place])   . Depression   . Essential hypertension   . Hyperlipidemia   . Lumbar disc disease   . Obesity   . OSA (obstructive sleep apnea)    CPAP  . Paroxysmal atrial fibrillation (HCC)    Element of tachycardia bradycardia syndrome  . Respiratory failure [facility])     Past Surgical History:  Procedure Laterality Date  . ANTERIOR FUSION LUMBAR SPINE    . BIOPSY  08/27/2016   Procedure: BIOPSY;  Surgeon: Rogene Houston, MD;  [facility];  Service: Endoscopy;;  FOUR GASTRIC POLYPS BIOPSIED  . CARDIOVERSION N/A 03/28/2014   Procedure: CARDIOVERSION;  Surgeon: Fay Records, MD;  Location: [facility] ORS;  Service: Cardiovascular;  Laterality: N/A;  . CARDIOVERSION N/A 10/22/2016   Procedure: CARDIOVERSION;  Surgeon: Sueanne Margarita, MD;  Location: [facility] ENDOSCOPY;  Service: Cardiovascular;  Laterality: N/A;  . CATARACT EXTRACTION    . COLONOSCOPY N/A 08/04/2012   Procedure: COLONOSCOPY;  Surgeon: Rogene Houston, MD;  [facility];  Service: Endoscopy;  Laterality: N/A;  730-rescheduled to [place] notified pt  . ELECTROPHYSIOLOGIC STUDY N/A  09/18/2014   Procedure: Atrial Fibrillation Ablation;  Surgeon: Thompson Grayer, MD;  Location: [place] CV LAB;  Service: Cardiovascular;  Laterality: N/A;  . ESOPHAGOGASTRODUODENOSCOPY N/A 08/27/2016   Procedure: ESOPHAGOGASTRODUODENOSCOPY (EGD);  Surgeon: Rogene Houston, MD;  [facility];  Service: Endoscopy;  Laterality: N/A;  1200  . LASIK     Both eyes  . TEE WITHOUT CARDIOVERSION N/A 09/18/2014   Procedure: TRANSESOPHAGEAL ECHOCARDIOGRAM (TEE);  Surgeon: Larey Dresser, MD;  Location: [place];  Service: Cardiovascular;  Laterality: N/A;  . TEE WITHOUT CARDIOVERSION N/A 10/22/2016   Procedure: TRANSESOPHAGEAL ECHOCARDIOGRAM (TEE);  Surgeon: Sueanne Margarita, MD;  Location: [facility] ENDOSCOPY;  Service: Cardiovascular;  Laterality: N/A;  . TEE WITHOUT CARDIOVERSION N/A 08/24/2017   Procedure: TRANSESOPHAGEAL ECHOCARDIOGRAM (TEE);  Surgeon: Josue Hector, MD;  Location: [facility] ENDOSCOPY;  Service: Cardiovascular;  Laterality: N/A;  . TOTAL HIP ARTHROPLASTY  2010   Right     Current Outpatient Medications:  .  acetaminophen (TYLENOL) 500 MG tablet, Take 500 mg by mouth daily as needed for headache., Disp: , Rfl:  .  albuterol (PROAIR HFA) 108 (90 Base) MCG/ACT inhaler, 2 puffs every 6 hours for wheezing as needed, Disp: 1 Inhaler, Rfl: 12 .  antiseptic oral rinse (BIOTENE) LIQD, 15 mLs by Mouth Rinse route as needed for dry mouth., Disp: , Rfl:  .  atorvastatin (LIPITOR) 10 MG tablet, Take 10  mg by mouth every evening. , Disp: , Rfl:  .  benzonatate (TESSALON) 200 MG capsule, Take 1 capsule (200 mg total) by mouth 3 (three) times daily as needed for cough., Disp: 30 capsule, Rfl: 3 .  Biotin 1 MG CAPS, Take 1,000 mg by mouth 3 (three) times a week. , Disp: , Rfl:  .  budesonide (PULMICORT) 0.5 MG/2ML nebulizer solution, Take 0.5 mg by nebulization daily. , Disp: , Rfl:  .  Calcium Carbonate-Vit D-Min (QC CALCIUM-MAGNESIUM-ZINC-D3) 333.4-133 MG-UNIT TABS, Take 1 tablet by mouth 2  (two) times daily. , Disp: , Rfl:  .  cetirizine (ZYRTEC) 10 MG tablet, Take 10 mg by mouth daily as needed for allergies., Disp: , Rfl:  .  clindamycin (CLEOCIN) 300 MG capsule, 2 capsules as needed. Take prior to dental procedures, Disp: , Rfl: 0 .  DERMA-SMOOTHE/FS SCALP 0.01 % OIL, APPLY A SMALL AMOUNT TO SKIN THREE TIMES A WEEK, PART HAIR APPLY TO SCALP, COVER WITH PLASTIC CAP, [place] OUT IN THE MONING, Disp: , Rfl:  .  Dextromethorphan-guaiFENesin (MUCINEX DM) 30-600 MG TB12, Take 1-2 tablets by mouth as needed. , Disp: , Rfl:  .  diltiazem (CARDIZEM) 30 MG tablet, Take 1 tablet (30 mg total) by mouth as needed (for palpitations)., Disp: 45 tablet, Rfl: 2 .  diltiazem (CARTIA XT) 120 MG 24 hr capsule, Take 1 capsule (120 mg total) by mouth at bedtime., Disp: 90 capsule, Rfl: 2 .  dofetilide (TIKOSYN) 500 MCG capsule, Take 1 capsule (500 mcg total) by mouth 2 (two) times daily., Disp: 180 capsule, Rfl: 2 .  doxycycline (VIBRAMYCIN) 50 MG capsule, Take 50 mg by mouth daily., Disp: , Rfl:  .  DOXYCYCLINE PO, Take by mouth. Taking one tablet by mouth daily, Disp: , Rfl:  .  famotidine (PEPCID) 20 MG tablet, Take 2 tablets (40 mg total) by mouth at bedtime. (Patient taking differently: Take 40 mg by mouth as needed. ), Disp: 90 tablet, Rfl: 3 .  FLUAD 0.5 ML SUSY, Inject as directed See admin instructions., Disp: , Rfl:  .  fluticasone (FLONASE) 50 MCG/ACT nasal spray, Place 1 spray into both nostrils 2 (two) times daily., Disp: , Rfl:  .  hydrALAZINE (APRESOLINE) 50 MG tablet, Take 50 mg by mouth 2 (two) times daily., Disp: , Rfl:  .  HYDROCODONE-HOMATROPINE PO, Taking by mouth prn for cough, Disp: , Rfl:  .  ipratropium-albuterol (DUONEB) 0.5-2.5 (3) MG/3ML SOLN, Take 3 mLs by nebulization daily. , Disp: , Rfl:  .  ketoconazole (NIZORAL) 2 % shampoo, APPLY SHAMPOO LEAVE ON SCALP FOR FIVE MINUTES, THEN RINSE OUT, Disp: , Rfl:  .  losartan (COZAAR) 100 MG tablet, Take 1 tablet (100 mg total) by mouth  daily., Disp: 90 tablet, Rfl: 2 .  Magnesium 250 MG TABS, Take 250 mg by mouth daily., Disp: , Rfl:  .  metroNIDAZOLE (METROGEL) 1 % gel, APPLY TO FACIAL RASH TWICE DAILY, Disp: , Rfl:  .  montelukast (SINGULAIR) 10 MG tablet, Take 10 mg by mouth at bedtime., Disp: , Rfl:  .  pantoprazole (PROTONIX) 40 MG tablet, Take 1 tablet (40 mg total) by mouth daily before breakfast., Disp: 90 tablet, Rfl: 3 .  potassium chloride SA (KLOR-CON) 20 MEQ tablet, Take 1 tablet (20 mEq total) by mouth daily., Disp: 90 tablet, Rfl: 2 .  sodium chloride (OCEAN) 0.65 % SOLN nasal spray, Place 1 spray into both nostrils 2 (two) times daily. , Disp: , Rfl:  .  triamcinolone ointment (  KENALOG) 0.1 %, Apply 1 application topically daily as needed for rash., Disp: , Rfl: 2 .  umeclidinium-vilanterol (ANORO ELLIPTA) 62.5-25 MCG/INH AEPB, Inhale 1 puff into the lungs daily., Disp: 180 each, Rfl: 3 .  warfarin (COUMADIN) 5 MG tablet, Take 5 mg by mouth as directed., Disp: , Rfl:  .  Wheat Dextrin (BENEFIBER ON THE GO) POWD, Take 1 Dose by mouth daily as needed (constipation). 2 tsp daily in the morning., Disp: , Rfl:   Current Facility-Administered Medications:  Marland Kitchen  Mepolizumab SOLR 100 mg, 100 mg, Subcutaneous, Q28 days, Young, Clinton D, MD, 100 mg at 11/30/18 0941 .  Mepolizumab SOLR 100 mg, 100 mg, Subcutaneous, Q28 days, Young, Clinton D, MD, 100 mg at 01/25/19 0957 .  Mepolizumab SOLR 100 mg, 100 mg, Subcutaneous, Q28 days, Young, Clinton D, MD, 100 mg at 02/22/19 1040 .  Mepolizumab SOLR 100 mg, 100 mg, Subcutaneous, Q28 days, Young, Clinton D, MD, 100 mg at 03/22/19 1024 .  Mepolizumab SOLR 100 mg, 100 mg, Subcutaneous, Q28 days, Young, Clinton D, MD, 100 mg at 04/19/19 1045 .  Mepolizumab SOLR 100 mg, 100 mg, Subcutaneous, Q28 days, Young, Clinton D, MD, 100 mg at 06/14/19 1026 .  Mepolizumab SOLR 100 mg, 100 mg, Subcutaneous, Q28 days, Young, Clinton D, MD, 100 mg at 07/13/19 1150  Allergies  Allergen Reactions   . Bupropion Hcl Other (See Comments)    Suicidal Thoughts.   . Escitalopram Oxalate Other (See Comments)    Suicidal Thoughts.   . Fluticasone-Salmeterol Other (See Comments)    Advair - Caused patient to go into Afib.   Marland Kitchen Lisinopril Cough  . Serevent Other (See Comments)    Caused patient to go into Afib          Objective:  Physical Exam  General: AAO x3, NAD  Dermatological: Nails are hypertrophic, dystrophic, brittle, discolored, elongated 10. No surrounding redness or drainage. Tenderness nails 1-5 bilaterally. No open lesions or pre-ulcerative lesions are identified today.  Vascular: Dorsalis Pedis artery and Posterior Tibial artery pedal pulses are 2/4 bilateral with immedate capillary fill time. Pedal hair growth present. No varicosities and no lower extremity edema present bilateral. There is no pain with calf compression, swelling, warmth, erythema.   Neruologic: Grossly intact via light touch bilateral.  Musculoskeletal: Flatfoot is present. Previous first MPJ arthrodesis on the left side. The right first MPJ is dorsiflexed at the MPJ level. Decreased range of motion of the first MPJ. No other areas of pinpoint tenderness identified flexor, extensor tendons appear to be intact. Muscular strength 5/5 in all groups tested bilateral.   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 Arthritic changes present the first MPJ with bunion deformity. Hammertoe contractures are present. Is no evidence of acute fracture. -In regards to her foot pain I do recommend orthotics. I will have her follow-up with Liliane Channel for this. Discussed shoe modifications. She is also having some gait changes and would recommend physical therapy for her as  well. [facility] physical therapy prescription written today. -Nails debrided x10 without any complications or bleeding  Return in about 4 weeks (around 09/12/2019).  [name]

## 2019-08-22 NOTE — Telephone Encounter (Signed)
I rewrote the prescription. Lattie Haw- it is on your desk. Thanks.

## 2019-08-22 NOTE — Telephone Encounter (Signed)
Pt states Dr. Jacqualyn Posey had discussed PT at the last visit.

## 2019-08-23 NOTE — Addendum Note (Signed)
Addended by: Teresita Madura on: 08/23/2019 10:59 AM   Modules accepted: Orders

## 2019-08-24 NOTE — Telephone Encounter (Signed)
Physical therapy has the form as of yesterday morning. Lattie Haw

## 2019-08-27 DIAGNOSIS — I10 Essential (primary) hypertension: Secondary | ICD-10-CM | POA: Diagnosis not present

## 2019-08-29 ENCOUNTER — Telehealth: Payer: Self-pay | Admitting: Internal Medicine

## 2019-08-29 NOTE — Telephone Encounter (Signed)
Nucala Order: 100mg  #1 Vial Order Date: 08/29/2019 Expected date of arrival: 08/30/2019 Ordered by: Desmond Dike, [place]  Specialty Pharmacy: [facility]

## 2019-08-30 NOTE — Telephone Encounter (Signed)
Nucala Shipment Received: 100mg  #1 vial Medication arrival date: 08/30/2019 Lot #: [id number] Exp date: 12/2022 Received by: Desmond Dike, [place]

## 2019-08-31 DIAGNOSIS — R262 Difficulty in walking, not elsewhere classified: Secondary | ICD-10-CM | POA: Diagnosis not present

## 2019-09-05 ENCOUNTER — Other Ambulatory Visit: Payer: Self-pay

## 2019-09-05 ENCOUNTER — Ambulatory Visit (INDEPENDENT_AMBULATORY_CARE_PROVIDER_SITE_OTHER): Payer: Medicare Other | Admitting: Orthotics

## 2019-09-05 DIAGNOSIS — M25572 Pain in left ankle and joints of left foot: Secondary | ICD-10-CM | POA: Diagnosis not present

## 2019-09-05 DIAGNOSIS — M2042 Other hammer toe(s) (acquired), left foot: Secondary | ICD-10-CM

## 2019-09-05 DIAGNOSIS — M2041 Other hammer toe(s) (acquired), right foot: Secondary | ICD-10-CM

## 2019-09-05 DIAGNOSIS — M25672 Stiffness of left ankle, not elsewhere classified: Secondary | ICD-10-CM | POA: Diagnosis not present

## 2019-09-05 DIAGNOSIS — M2021 Hallux rigidus, right foot: Secondary | ICD-10-CM

## 2019-09-05 DIAGNOSIS — I10 Essential (primary) hypertension: Secondary | ICD-10-CM | POA: Diagnosis not present

## 2019-09-05 DIAGNOSIS — R262 Difficulty in walking, not elsewhere classified: Secondary | ICD-10-CM | POA: Diagnosis not present

## 2019-09-05 DIAGNOSIS — M25671 Stiffness of right ankle, not elsewhere classified: Secondary | ICD-10-CM | POA: Diagnosis not present

## 2019-09-05 DIAGNOSIS — E6609 Other obesity due to excess calories: Secondary | ICD-10-CM | POA: Diagnosis not present

## 2019-09-05 DIAGNOSIS — M6281 Muscle weakness (generalized): Secondary | ICD-10-CM | POA: Diagnosis not present

## 2019-09-05 DIAGNOSIS — M25571 Pain in right ankle and joints of right foot: Secondary | ICD-10-CM | POA: Diagnosis not present

## 2019-09-05 DIAGNOSIS — M799 Soft tissue disorder, unspecified: Secondary | ICD-10-CM | POA: Diagnosis not present

## 2019-09-05 DIAGNOSIS — M1990 Unspecified osteoarthritis, unspecified site: Secondary | ICD-10-CM | POA: Diagnosis not present

## 2019-09-05 DIAGNOSIS — M818 Other osteoporosis without current pathological fracture: Secondary | ICD-10-CM | POA: Diagnosis not present

## 2019-09-05 NOTE — Addendum Note (Signed)
Addended by: Velora Heckler on: 09/05/2019 11:29 AM   Modules accepted: Level of Service

## 2019-09-05 NOTE — Progress Notes (Signed)
Patient came in today to pick up custom made foot orthotics.  The goals were accomplished and the patient reported no dissatisfaction with said orthotics.  Patient was advised of breakin period and how to report any issues.  Patient signed Medicare ABN

## 2019-09-06 DIAGNOSIS — E6609 Other obesity due to excess calories: Secondary | ICD-10-CM | POA: Diagnosis not present

## 2019-09-06 DIAGNOSIS — M25572 Pain in left ankle and joints of left foot: Secondary | ICD-10-CM | POA: Diagnosis not present

## 2019-09-06 DIAGNOSIS — R262 Difficulty in walking, not elsewhere classified: Secondary | ICD-10-CM | POA: Diagnosis not present

## 2019-09-06 DIAGNOSIS — I10 Essential (primary) hypertension: Secondary | ICD-10-CM | POA: Diagnosis not present

## 2019-09-06 DIAGNOSIS — M1990 Unspecified osteoarthritis, unspecified site: Secondary | ICD-10-CM | POA: Diagnosis not present

## 2019-09-06 DIAGNOSIS — M818 Other osteoporosis without current pathological fracture: Secondary | ICD-10-CM | POA: Diagnosis not present

## 2019-09-06 DIAGNOSIS — M25571 Pain in right ankle and joints of right foot: Secondary | ICD-10-CM | POA: Diagnosis not present

## 2019-09-06 DIAGNOSIS — M25672 Stiffness of left ankle, not elsewhere classified: Secondary | ICD-10-CM | POA: Diagnosis not present

## 2019-09-06 DIAGNOSIS — M6281 Muscle weakness (generalized): Secondary | ICD-10-CM | POA: Diagnosis not present

## 2019-09-06 DIAGNOSIS — M25671 Stiffness of right ankle, not elsewhere classified: Secondary | ICD-10-CM | POA: Diagnosis not present

## 2019-09-06 DIAGNOSIS — M799 Soft tissue disorder, unspecified: Secondary | ICD-10-CM | POA: Diagnosis not present

## 2019-09-07 ENCOUNTER — Ambulatory Visit (INDEPENDENT_AMBULATORY_CARE_PROVIDER_SITE_OTHER): Payer: Medicare Other

## 2019-09-07 ENCOUNTER — Other Ambulatory Visit: Payer: Self-pay

## 2019-09-07 DIAGNOSIS — R262 Difficulty in walking, not elsewhere classified: Secondary | ICD-10-CM | POA: Diagnosis not present

## 2019-09-07 DIAGNOSIS — M25671 Stiffness of right ankle, not elsewhere classified: Secondary | ICD-10-CM | POA: Diagnosis not present

## 2019-09-07 DIAGNOSIS — E6609 Other obesity due to excess calories: Secondary | ICD-10-CM | POA: Diagnosis not present

## 2019-09-07 DIAGNOSIS — M799 Soft tissue disorder, unspecified: Secondary | ICD-10-CM | POA: Diagnosis not present

## 2019-09-07 DIAGNOSIS — M6281 Muscle weakness (generalized): Secondary | ICD-10-CM | POA: Diagnosis not present

## 2019-09-07 DIAGNOSIS — M25672 Stiffness of left ankle, not elsewhere classified: Secondary | ICD-10-CM | POA: Diagnosis not present

## 2019-09-07 DIAGNOSIS — I10 Essential (primary) hypertension: Secondary | ICD-10-CM | POA: Diagnosis not present

## 2019-09-07 DIAGNOSIS — J4520 Mild intermittent asthma, uncomplicated: Secondary | ICD-10-CM | POA: Diagnosis not present

## 2019-09-07 DIAGNOSIS — M1990 Unspecified osteoarthritis, unspecified site: Secondary | ICD-10-CM | POA: Diagnosis not present

## 2019-09-07 DIAGNOSIS — M25571 Pain in right ankle and joints of right foot: Secondary | ICD-10-CM | POA: Diagnosis not present

## 2019-09-07 DIAGNOSIS — M818 Other osteoporosis without current pathological fracture: Secondary | ICD-10-CM | POA: Diagnosis not present

## 2019-09-07 DIAGNOSIS — M25572 Pain in left ankle and joints of left foot: Secondary | ICD-10-CM | POA: Diagnosis not present

## 2019-09-07 MED ORDER — MEPOLIZUMAB 100 MG ~~LOC~~ SOLR
100.0000 mg | SUBCUTANEOUS | Status: DC
  Administered 2019-09-07: 100 mg via SUBCUTANEOUS

## 2019-09-07 NOTE — Progress Notes (Signed)
All questions were answered by the patient before medication was administered. Have you been hospitalized in the last 10 days? No Do you have a fever? No Do you have a cough? No Do you have a headache or sore throat? No  

## 2019-09-14 ENCOUNTER — Telehealth: Payer: Self-pay | Admitting: Cardiology

## 2019-09-14 NOTE — Telephone Encounter (Signed)
Pt called stating that she was in Afib last night for about 5 hours and this morning when she got up she's really sore on her left side - and she's having shortness of breath, feels like the left side of her ear is stopped up and that she has sinus pressure on the left side  States no dizziness or chest pain at this time   [phone]

## 2019-09-14 NOTE — Telephone Encounter (Signed)
Pt says she was in afib (SOB/palps/HR 109) last night around 6pm took dilt 30 mg and this helped for about an hour then felt palpitations/SOB again HR 103 BP 179/88 - this morning still c/o SOB and left side stiffness/soreness - BP this morning 149/80 HR 64 - says she doesn't feel like she is in afib at this time but is concerned with left side of her chest being sore/stiff and the SOB "feels different" than normal - pcp recently increased hydralazine 100 mg bid - pt still taking Cartia XT 120 mg at bedtime daily

## 2019-09-14 NOTE — Telephone Encounter (Signed)
     By review of her vitals and symptoms during that timeframe, it does sound like she was in atrial fibrillation and converted. By review of recent Atrial Fibrillation Clinic notes, she has utilized taking short acting Cardizem in the past with improvement in her symptoms similar to last night. Given that BP is still slightly elevated and that she still has dyspnea, I would recommend she take an extra short-acting Cardizem tablet to see if that helps.   If her dyspnea does not improve or if she develops worsening chest discomfort, she should go to the Emergency Department for further evaluation as symptoms could be due to a different etiology and not her atrial fibrillation.   Signed, Erma Heritage, PA-C 09/14/2019, 12:00 PM Pager: [phone]

## 2019-09-14 NOTE — Telephone Encounter (Signed)
Pt voiced understanding - will take extra short acting dilt as needed and monitor symptoms

## 2019-09-19 ENCOUNTER — Observation Stay (HOSPITAL_COMMUNITY)
Admission: EM | Admit: 2019-09-19 | Discharge: 2019-09-20 | Disposition: A | Discharge status: Home / Self Care | Payer: Medicare Other | Attending: Internal Medicine | Admitting: Internal Medicine

## 2019-09-19 ENCOUNTER — Emergency Department (HOSPITAL_COMMUNITY): Payer: Medicare Other

## 2019-09-19 ENCOUNTER — Other Ambulatory Visit: Payer: Self-pay

## 2019-09-19 ENCOUNTER — Encounter (HOSPITAL_COMMUNITY): Payer: Self-pay | Admitting: Emergency Medicine

## 2019-09-19 DIAGNOSIS — J45909 Unspecified asthma, uncomplicated: Secondary | ICD-10-CM | POA: Insufficient documentation

## 2019-09-19 DIAGNOSIS — I480 Paroxysmal atrial fibrillation: Secondary | ICD-10-CM | POA: Diagnosis present

## 2019-09-19 DIAGNOSIS — I10 Essential (primary) hypertension: Secondary | ICD-10-CM | POA: Diagnosis present

## 2019-09-19 DIAGNOSIS — I4892 Unspecified atrial flutter: Secondary | ICD-10-CM | POA: Insufficient documentation

## 2019-09-19 DIAGNOSIS — Z7901 Long term (current) use of anticoagulants: Secondary | ICD-10-CM | POA: Diagnosis not present

## 2019-09-19 DIAGNOSIS — Z20822 Contact with and (suspected) exposure to covid-19: Secondary | ICD-10-CM | POA: Insufficient documentation

## 2019-09-19 DIAGNOSIS — R0789 Other chest pain: Secondary | ICD-10-CM | POA: Diagnosis present

## 2019-09-19 DIAGNOSIS — I4891 Unspecified atrial fibrillation: Secondary | ICD-10-CM | POA: Diagnosis not present

## 2019-09-19 DIAGNOSIS — Z888 Allergy status to other drugs, medicaments and biological substances status: Secondary | ICD-10-CM | POA: Insufficient documentation

## 2019-09-19 DIAGNOSIS — E6609 Other obesity due to excess calories: Secondary | ICD-10-CM

## 2019-09-19 DIAGNOSIS — Z79899 Other long term (current) drug therapy: Secondary | ICD-10-CM | POA: Diagnosis not present

## 2019-09-19 DIAGNOSIS — R079 Chest pain, unspecified: Secondary | ICD-10-CM | POA: Diagnosis not present

## 2019-09-19 DIAGNOSIS — K219 Gastro-esophageal reflux disease without esophagitis: Secondary | ICD-10-CM | POA: Diagnosis present

## 2019-09-19 DIAGNOSIS — J449 Chronic obstructive pulmonary disease, unspecified: Secondary | ICD-10-CM | POA: Insufficient documentation

## 2019-09-19 DIAGNOSIS — J4520 Mild intermittent asthma, uncomplicated: Secondary | ICD-10-CM | POA: Diagnosis present

## 2019-09-19 DIAGNOSIS — Z7951 Long term (current) use of inhaled steroids: Secondary | ICD-10-CM | POA: Insufficient documentation

## 2019-09-19 DIAGNOSIS — D649 Anemia, unspecified: Secondary | ICD-10-CM | POA: Diagnosis present

## 2019-09-19 LAB — CBC
HCT: 36 % (ref 36.0–46.0)
Hemoglobin: 11 g/dL — ABNORMAL LOW (ref 12.0–15.0)
MCH: 25.5 pg — ABNORMAL LOW (ref 26.0–34.0)
MCHC: 30.6 g/dL (ref 30.0–36.0)
MCV: 83.5 fL (ref 80.0–100.0)
Platelets: 215 10*3/uL (ref 150–400)
RBC: 4.31 MIL/uL (ref 3.87–5.11)
RDW: 16.3 % — ABNORMAL HIGH (ref 11.5–15.5)
WBC: 6.9 10*3/uL (ref 4.0–10.5)
nRBC: 0 % (ref 0.0–0.2)

## 2019-09-19 LAB — BASIC METABOLIC PANEL
Anion gap: 10 (ref 5–15)
BUN: 19 mg/dL (ref 8–23)
CO2: 22 mmol/L (ref 22–32)
Calcium: 9.1 mg/dL (ref 8.9–10.3)
Chloride: 106 mmol/L (ref 98–111)
Creatinine, Ser: 1 mg/dL (ref 0.44–1.00)
GFR calc Af Amer: >60 mL/min (ref >60)
GFR calc non Af Amer: 54 mL/min — ABNORMAL LOW (ref >60)
Glucose, Bld: 104 mg/dL — ABNORMAL HIGH (ref 70–99)
Potassium: 4 mmol/L (ref 3.5–5.1)
Sodium: 138 mmol/L (ref 135–145)

## 2019-09-19 LAB — PROTIME-INR
INR: 2 — ABNORMAL HIGH (ref 0.8–1.2)
Prothrombin Time: 22.4 seconds — ABNORMAL HIGH (ref 11.4–15.2)

## 2019-09-19 LAB — TROPONIN I (HIGH SENSITIVITY): Troponin I (High Sensitivity): 7 ng/L (ref <18)

## 2019-09-19 MED ORDER — ALUM & MAG HYDROXIDE-SIMETH 200-200-20 MG/5ML PO SUSP
30.0000 mL | Freq: Once | ORAL | Status: AC
  Administered 2019-09-19: 30 mL via ORAL
  Filled 2019-09-19: qty 30

## 2019-09-19 MED ORDER — ASPIRIN 81 MG PO CHEW
324.0000 mg | CHEWABLE_TABLET | Freq: Once | ORAL | Status: AC
  Administered 2019-09-19: 324 mg via ORAL
  Filled 2019-09-19: qty 4

## 2019-09-19 MED ORDER — LIDOCAINE VISCOUS HCL 2 % MT SOLN
15.0000 mL | Freq: Once | OROMUCOSAL | Status: AC
  Administered 2019-09-19: 15 mL via ORAL
  Filled 2019-09-19: qty 15

## 2019-09-19 MED ORDER — SODIUM CHLORIDE 0.9% FLUSH
3.0000 mL | Freq: Once | INTRAVENOUS | Status: AC
  Administered 2019-09-20: 3 mL via INTRAVENOUS

## 2019-09-19 NOTE — ED Triage Notes (Signed)
Pt here after having a tightness in her chest and back since this afternoon. Pt in Afib on the monitor Hx of the same.

## 2019-09-19 NOTE — ED Provider Notes (Signed)
[facility] EMERGENCY DEPARTMENT Provider Note   CSN: [id number] Arrival date & time: 09/19/19  2159     History Chief Complaint  Patient presents with  . Chest Pain    [name] [name] is a 79 y.o. female.  Patient with history of atrial fibrillation on Coumadin, obesity, sleep apnea, COPD, acid reflux disease presenting with central chest pain since approximately 1:30 PM.  Pain started while she was leaving the dentist after having a tooth worked on.  Pain feels like a pressure in the center of her chest that radiates to her left shoulder and throat.  The pain was "severe" for about 1 hour and then eased off but never went away completely.  Pain has been ongoing since then. It is associated with some belching and nausea but no vomiting.  No difficulty breathing, diaphoresis, abdominal pain, low back pain.  She has had similar pain in the past from her atrial fibrillation.  She took a Pepcid at home without relief.  Denies any cardiac history.  Reports a negative stress test many years ago.  Denies any abdominal pain, vomiting or fever.  No leg pain or leg swelling.  The history is provided by the patient and the EMS personnel.  Chest Pain Associated symptoms: nausea and shortness of breath   Associated symptoms: no abdominal pain, no dizziness, no fatigue, no fever, no headache, no vomiting and no weakness        Past Medical History:  Diagnosis Date  . Allergic rhinitis   . Anxiety   . Asthma   . Atrial flutter ([place])   . COPD (chronic obstructive pulmonary disease) ([place])   . Depression   . Essential hypertension   . Hyperlipidemia   . Lumbar disc disease   . Obesity   . OSA (obstructive sleep apnea)    CPAP  . Paroxysmal atrial fibrillation (H[facility])    Element of tachycardia bradycardia syndrome  . Respiratory failure [facility])     Patient Active Problem List   Diagnosis Date Noted  . 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 Acute maxillary sinusitis  06/03/2018  . Deformity, toe acquired, left 02/22/2018  . Thrush 10/28/2017  . Edema 09/20/2017  . Visit for monitoring Tikosyn therapy 08/24/2017  . Acute asthma exacerbation 07/20/2017  . Gastroesophageal reflux disease 06/24/2016  . Gastroesophageal reflux disease without esophagitis 03/04/2016  . Hammer toe of second toe of left foot 09/27/2015  . A-fib ([place]) 09/18/2014  . Persistent atrial fibrillation ([place])   . Anemia 08/28/2013  . Asthma 08/28/2013  . Hypertension 08/28/2013  . Morbid obesity ([place]) 06/21/2013  . Asthma, mild intermittent 10/18/2012  . Seasonal and perennial allergic rhinitis 10/18/2012  . Atrial flutter ([place]) 07/14/2011  . Long term (current) use of anticoagulants 07/03/2011  . 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 HYPERLIPIDEMIA-MIXED 11/22/2008  . Essential hypertension, benign 11/22/2008  . Shortness of breath 11/22/2008  . Obstructive sleep apnea 05/20/2007  . Paroxysmal atrial fibrillation ([place]) 05/20/2007    Past Surgical History:  Procedure Laterality Date  . ANTERIOR FUSION LUMBAR SPINE    . BIOPSY  08/27/2016   Procedure: BIOPSY;  Surgeon: Rogene Houston, MD;  Location: [facility] ENDO SUITE;  Service: Endoscopy;;  FOUR GASTRIC POLYPS BIOPSIED  . CARDIOVERSION N/A 03/28/2014   Procedure: CARDIOVERSION;  Surgeon: Fay Records, MD;  Location: [facility] ORS;  Service: Cardiovascular;  Laterality: N/A;  . CARDIOVERSION N/A 10/22/2016   Procedure: CARDIOVERSION;  Surgeon: Sueanne Margarita, MD;  Location: [place] ENDOSCOPY;  Service: Cardiovascular;  Laterality: N/A;  . CATARACT EXTRACTION    . COLONOSCOPY N/A 08/04/2012   Procedure: COLONOSCOPY;  Surgeon: Rogene Houston, MD;  Location: [facility] ENDO [facility];  Service: Endoscopy;  Laterality: N/A;  730-rescheduled to [place] notified pt  . ELECTROPHYSIOLOGIC STUDY N/A 09/18/2014   Procedure: Atrial Fibrillation Ablation;  Surgeon: Thompson Grayer, MD;  Location: [place] CV LAB;  Service: Cardiovascular;  Laterality:  N/A;  . ESOPHAGOGASTRODUODENOSCOPY N/A 08/27/2016   Procedure: ESOPHAGOGASTRODUODENOSCOPY (EGD);  Surgeon: Rogene Houston, MD;  Location: [facility];  Service: Endoscopy;  Laterality: N/A;  1200  . LASIK     Both eyes  . TEE WITHOUT CARDIOVERSION N/A 09/18/2014   Procedure: TRANSESOPHAGEAL ECHOCARDIOGRAM (TEE);  Surgeon: Larey Dresser, MD;  Location: [place];  Service: Cardiovascular;  Laterality: N/A;  . TEE WITHOUT CARDIOVERSION N/A 10/22/2016   Procedure: TRANSESOPHAGEAL ECHOCARDIOGRAM (TEE);  Surgeon: Sueanne Margarita, MD;  Location: [facility] ENDOSCOPY;  Service: Cardiovascular;  Laterality: N/A;  . TEE WITHOUT CARDIOVERSION N/A 08/24/2017   Procedure: TRANSESOPHAGEAL ECHOCARDIOGRAM (TEE);  Surgeon: Josue Hector, MD;  Location: [facility] ENDOSCOPY;  Service: Cardiovascular;  Laterality: N/A;  . TOTAL HIP ARTHROPLASTY  2010   Right     OB History   No obstetric history on file.     Family History  Problem Relation Age of Onset  . Cancer Mother   . Heart attack Father   . Colon cancer Other     Social History   Tobacco Use  . Smoking status: Never Smoker  . Smokeless tobacco: Never Used  Vaping Use  . Vaping Use: Never used  Substance Use Topics  . Alcohol use: No    Alcohol/week: 0.0 standard drinks  . Drug use: No    Home Medications Prior to Admission medications   Medication Sig Start Date End Date Taking? Authorizing Provider  acetaminophen (TYLENOL) 500 MG tablet Take 500 mg by mouth daily as needed for headache.    [provider]  albuterol (PROAIR HFA) 108 (90 Base) MCG/ACT inhaler 2 puffs every 6 hours for wheezing as needed 04/12/18   Baird Lyons D, MD  antiseptic oral rinse (BIOTENE) LIQD 15 mLs by Mouth Rinse route as needed for dry mouth.    [provider]  atorvastatin (LIPITOR) 10 MG tablet Take 10 mg by mouth every evening.     [provider]  benzonatate (TESSALON) 200 MG capsule Take 1 capsule (200 mg total) by mouth 3  (three) times daily as needed for cough. 09/20/17   Lauraine Rinne, NP  Biotin 1 MG CAPS Take 1,000 mg by mouth 3 (three) times a week.     [provider]  budesonide (PULMICORT) 0.5 MG/2ML nebulizer solution Take 0.5 mg by nebulization daily.  08/13/11   Deneise Lever, MD  Calcium Carbonate-Vit D-Min (QC CALCIUM-MAGNESIUM-ZINC-D3) 333.4-133 MG-UNIT TABS Take 1 tablet by mouth 2 (two) times daily.     [provider]  cetirizine (ZYRTEC) 10 MG tablet Take 10 mg by mouth daily as needed for allergies.    [provider]  clindamycin (CLEOCIN) 300 MG capsule 2 capsules as needed. Take prior to dental procedures 08/30/17   [provider]  DERMA-SMOOTHE/FS SCALP 0.01 % OIL APPLY A SMALL AMOUNT TO SKIN THREE TIMES A WEEK, PART HAIR APPLY TO SCALP, COVER WITH PLASTIC CAP, [place] OUT IN THE [facility] 08/07/19   [provider]  Dextromethorphan-guaiFENesin (MUCINEX DM) 30-600 MG TB12 Take 1-2 tablets by  mouth as needed.     [provider]  diltiazem (CARDIZEM) 30 MG tablet Take 1 tablet (30 mg total) by mouth as needed (for palpitations). 10/24/18   Sherran Needs, NP  diltiazem (CARTIA XT) 120 MG 24 hr capsule Take 1 capsule (120 mg total) by mouth at bedtime. 12/19/18   Sherran Needs, NP  dofetilide (TIKOSYN) 500 MCG capsule Take 1 capsule (500 mcg total) by mouth 2 (two) times daily. 08/08/19   Sherran Needs, NP  doxycycline (VIBRAMYCIN) 50 MG capsule Take 50 mg by mouth daily. 08/07/19   [provider]  DOXYCYCLINE PO Take by mouth. Taking one tablet by mouth daily    [provider]  famotidine (PEPCID) 20 MG tablet Take 2 tablets (40 mg total) by mouth at bedtime. Patient taking differently: Take 40 mg by mouth as needed.  05/02/19   Rehman, Mechele Dawley, MD  FLUAD 0.5 ML SUSY Inject as directed See admin instructions. 11/19/18   [provider]  fluticasone (FLONASE) 50 MCG/ACT nasal spray Place 1 spray into both nostrils 2  (two) times daily.    [provider]  hydrALAZINE (APRESOLINE) 50 MG tablet Take 50 mg by mouth 2 (two) times daily.    [provider]  HYDROCODONE-HOMATROPINE PO Taking by mouth prn for cough    [provider]  ipratropium-albuterol (DUONEB) 0.5-2.5 (3) MG/3ML SOLN Take 3 mLs by nebulization daily.     [provider]  ketoconazole (NIZORAL) 2 % shampoo APPLY SHAMPOO LEAVE ON SCALP FOR FIVE MINUTES, THEN RINSE OUT 08/07/19   [provider]  losartan (COZAAR) 100 MG tablet Take 1 tablet (100 mg total) by mouth daily. 05/31/19 08/29/19  Sherran Needs, NP  Magnesium 250 MG TABS Take 250 mg by mouth daily.    [provider]  metroNIDAZOLE (METROGEL) 1 % gel APPLY TO FACIAL RASH TWICE DAILY 08/07/19   [provider]  montelukast (SINGULAIR) 10 MG tablet Take 10 mg by mouth at bedtime.    [provider]  pantoprazole (PROTONIX) 40 MG tablet Take 1 tablet (40 mg total) by mouth daily before breakfast. 05/02/19   Rehman, Mechele Dawley, MD  potassium chloride SA (KLOR-CON) 20 MEQ tablet Take 1 tablet (20 mEq total) by mouth daily. 01/24/19   Sherran Needs, NP  sodium chloride (OCEAN) 0.65 % SOLN nasal spray Place 1 spray into both nostrils 2 (two) times daily.     [provider]  triamcinolone ointment (KENALOG) 0.1 % Apply 1 application topically daily as needed for rash. 06/01/17   [provider]  umeclidinium-vilanterol (ANORO ELLIPTA) 62.5-25 MCG/INH AEPB Inhale 1 puff into the lungs daily. 06/21/19   Baird Lyons D, MD  warfarin (COUMADIN) 5 MG tablet Take 5 mg by mouth as directed. 02/05/19   [provider]  Wheat Dextrin (BENEFIBER ON THE GO) POWD Take 1 Dose by mouth daily as needed (constipation). 2 tsp daily in the morning.    [provider]    Allergies    Bupropion hcl, Escitalopram oxalate, Fluticasone-salmeterol, Lisinopril, and Serevent  Review of Systems   Review of Systems    Constitutional: Negative for activity change, appetite change, fatigue and fever.  HENT: Negative for congestion and rhinorrhea.   Eyes: Negative for visual disturbance.  Respiratory: Positive for chest tightness and shortness of breath.   Cardiovascular: Positive for chest pain.  Gastrointestinal: Positive for nausea. Negative for abdominal pain and vomiting.  Genitourinary: Negative for dysuria  and hematuria.  Musculoskeletal: Negative for arthralgias and myalgias.  Skin: Negative for rash.  Neurological: Negative for dizziness, weakness and headaches.   all other systems are negative except as noted in the HPI and PMH.   Physical Exam Updated Vital Signs BP (!) 172/56   Pulse 65   Temp 98.1 F (36.7 C)   Resp 16   Ht 5\' 3"  (1.6 m)   Wt 86.6 kg   SpO2 99%   BMI 33.83 kg/m   Physical Exam Vitals and nursing note reviewed.  Constitutional:      General: She is not in acute distress.    Appearance: She is well-developed. She is obese.  HENT:     Head: Normocephalic and atraumatic.     Mouth/Throat:     Pharynx: No oropharyngeal exudate.  Eyes:     Conjunctiva/sclera: Conjunctivae normal.     Pupils: Pupils are equal, round, and reactive to light.  Neck:     Comments: No meningismus. Cardiovascular:     Rate and Rhythm: Normal rate. Rhythm irregular.     Heart sounds: Normal heart sounds. No murmur heard.   Pulmonary:     Effort: Pulmonary effort is normal. No respiratory distress.     Breath sounds: Normal breath sounds.  Abdominal:     Palpations: Abdomen is soft.     Tenderness: There is no abdominal tenderness. There is no guarding or rebound.  Musculoskeletal:        General: No tenderness. Normal range of motion.     Cervical back: Normal range of motion and neck supple.  Skin:    General: Skin is warm.  Neurological:     Mental Status: She is alert and oriented to person, place, and time.     Cranial Nerves: No cranial nerve deficit.     Motor: No  abnormal muscle tone.     Coordination: Coordination normal.     Comments: No ataxia on finger to nose bilaterally. No pronator drift. 5/5 strength throughout. CN 2-12 intact.Equal grip strength. Sensation intact.   Psychiatric:        Behavior: Behavior normal.     ED Results / Procedures / Treatments   Labs (all labs ordered are listed, but only abnormal results are displayed) Labs Reviewed  BASIC METABOLIC PANEL - Abnormal; Notable for the following components:      Result Value   Glucose, Bld 104 (*)    GFR calc non Af Amer 54 (*)    All other components within normal limits  CBC - Abnormal; Notable for the following components:   Hemoglobin 11.0 (*)    MCH 25.5 (*)    RDW 16.3 (*)    All other components within normal limits  PROTIME-INR - Abnormal; Notable for the following components:   Prothrombin Time 22.4 (*)    INR 2.0 (*)    All other components within normal limits  SARS CORONAVIRUS 2 BY RT PCR (HOSPITAL ORDER, [place] [facility])  HEPATIC FUNCTION PANEL  LIPASE, BLOOD  TROPONIN I (HIGH SENSITIVITY)  TROPONIN I (HIGH SENSITIVITY)    EKG EKG Interpretation  Date/Time:  Tuesday September 19 2019 22:05:07 EDT Ventricular Rate:  59 PR Interval:    QRS Duration: 97 QT Interval:  475 QTC Calculation: 471 R Axis:   -40 Text Interpretation: Sinus bradycardia Left anterior fascicular block Baseline wander in lead(s) V2 No STEMI Confirmed by Nanda Quinton [phone]) on 09/19/2019 10:07:38 PM   Radiology DG Chest 2  View  Result Date: 09/19/2019 CLINICAL DATA:  Chest tightness EXAM: CHEST - 2 VIEW COMPARISON:  08/08/2019 FINDINGS: Unchanged mild cardiomegaly. No focal airspace consolidation or pulmonary edema. No pleural effusion or pneumothorax. IMPRESSION: Mild cardiomegaly without focal airspace disease. Electronically Signed   By: Ulyses Jarred M.D.   On: 09/19/2019 22:45    Procedures Procedures (including critical care time)  Medications Ordered  in ED Medications  sodium chloride flush (NS) 0.9 % injection 3 mL (has no administration in time range)  aspirin chewable tablet 324 mg (has no administration in time range)  alum & mag hydroxide-simeth (MAALOX/MYLANTA) 200-200-20 MG/5ML suspension 30 mL (has no administration in time range)    And  lidocaine (XYLOCAINE) 2 % viscous mouth solution 15 mL (has no administration in time range)    ED Course  I have reviewed the triage vital signs and the nursing notes.  Pertinent labs & imaging results that were available during my care of the patient were reviewed by me and considered in my medical decision making (see chart for details).    MDM Rules/Calculators/A&P                         Central chest pain rating to the left shoulder since approximately 1:30 PM.  Her EKG shows irregular bradycardia without acute ischemia.  Equal upper extremity blood pressures.  INR 2.0.  Low suspicion for pulmonary embolism or dissection.  Troponin negative.  LFTs and lipase are normal. Heart score is 4.  Pain is improved after aspirin and GI cocktail.  Last stress test 2012 in system. Patient heart score is 4-5.  Would benefit from overnight observation to rule out.  Discussed with Dr. Olevia Bowens. Final Clinical Impression(s) / ED Diagnoses Final diagnoses:  Nonspecific chest pain    Rx / DC Orders ED Discharge Orders    None       Nikitha Mode, Annie Main, MD 09/20/19 [phone]

## 2019-09-20 ENCOUNTER — Encounter (HOSPITAL_COMMUNITY): Payer: Self-pay | Admitting: Internal Medicine

## 2019-09-20 ENCOUNTER — Observation Stay (HOSPITAL_BASED_OUTPATIENT_CLINIC_OR_DEPARTMENT_OTHER): Payer: Medicare Other

## 2019-09-20 DIAGNOSIS — J4520 Mild intermittent asthma, uncomplicated: Secondary | ICD-10-CM

## 2019-09-20 DIAGNOSIS — R079 Chest pain, unspecified: Secondary | ICD-10-CM | POA: Diagnosis not present

## 2019-09-20 DIAGNOSIS — R0789 Other chest pain: Secondary | ICD-10-CM | POA: Diagnosis not present

## 2019-09-20 DIAGNOSIS — I10 Essential (primary) hypertension: Secondary | ICD-10-CM | POA: Diagnosis not present

## 2019-09-20 DIAGNOSIS — I480 Paroxysmal atrial fibrillation: Secondary | ICD-10-CM

## 2019-09-20 DIAGNOSIS — E6609 Other obesity due to excess calories: Secondary | ICD-10-CM

## 2019-09-20 DIAGNOSIS — K219 Gastro-esophageal reflux disease without esophagitis: Secondary | ICD-10-CM

## 2019-09-20 DIAGNOSIS — R072 Precordial pain: Secondary | ICD-10-CM

## 2019-09-20 DIAGNOSIS — Z6833 Body mass index (BMI) 33.0-33.9, adult: Secondary | ICD-10-CM

## 2019-09-20 DIAGNOSIS — D649 Anemia, unspecified: Secondary | ICD-10-CM

## 2019-09-20 LAB — ECHOCARDIOGRAM COMPLETE
Height: 63 in
Weight: 3049.6 oz

## 2019-09-20 LAB — HEPATIC FUNCTION PANEL
ALT: 17 U/L (ref 0–44)
AST: 29 U/L (ref 15–41)
Albumin: 3.8 g/dL (ref 3.5–5.0)
Alkaline Phosphatase: 68 U/L (ref 38–126)
Bilirubin, Direct: <0.1 mg/dL (ref 0.0–0.2)
Total Bilirubin: 0.5 mg/dL (ref 0.3–1.2)
Total Protein: 7 g/dL (ref 6.5–8.1)

## 2019-09-20 LAB — TROPONIN I (HIGH SENSITIVITY): Troponin I (High Sensitivity): 6 ng/L (ref <18)

## 2019-09-20 LAB — SARS CORONAVIRUS 2 BY RT PCR (HOSPITAL ORDER, PERFORMED IN ~~LOC~~ HOSPITAL LAB): SARS Coronavirus 2: NEGATIVE

## 2019-09-20 LAB — LIPASE, BLOOD: Lipase: 34 U/L (ref 11–51)

## 2019-09-20 MED ORDER — PANTOPRAZOLE SODIUM 40 MG PO TBEC
40.0000 mg | DELAYED_RELEASE_TABLET | Freq: Every day | ORAL | Status: DC
  Filled 2019-09-20: qty 1

## 2019-09-20 MED ORDER — ACETAMINOPHEN 650 MG RE SUPP: 650.0000 mg | Freq: Four times a day (QID) | RECTAL | Status: DC | PRN

## 2019-09-20 MED ORDER — BISMUTH SUBSALICYLATE 262 MG/15ML PO SUSP
30.0000 mL | ORAL | Status: DC | PRN
  Filled 2019-09-20: qty 118

## 2019-09-20 MED ORDER — ACETAMINOPHEN 325 MG PO TABS: 650.0000 mg | ORAL_TABLET | Freq: Four times a day (QID) | ORAL | Status: DC | PRN

## 2019-09-20 MED ORDER — FAMOTIDINE IN NACL 20-0.9 MG/50ML-% IV SOLN
20.0000 mg | Freq: Once | INTRAVENOUS | Status: AC
  Administered 2019-09-20: 20 mg via INTRAVENOUS
  Filled 2019-09-20: qty 50

## 2019-09-20 MED ORDER — LORATADINE 10 MG PO TABS
10.0000 mg | ORAL_TABLET | Freq: Every day | ORAL | Status: DC
  Administered 2019-09-20: 10 mg via ORAL
  Filled 2019-09-20: qty 1

## 2019-09-20 MED ORDER — BENZONATATE 100 MG PO CAPS: 200.0000 mg | ORAL_CAPSULE | Freq: Three times a day (TID) | ORAL | Status: DC | PRN

## 2019-09-20 MED ORDER — DILTIAZEM HCL 30 MG PO TABS: 30.0000 mg | ORAL_TABLET | ORAL | Status: DC | PRN

## 2019-09-20 MED ORDER — PANTOPRAZOLE SODIUM 40 MG PO TBEC: 40.0000 mg | DELAYED_RELEASE_TABLET | Freq: Two times a day (BID) | ORAL | Status: DC

## 2019-09-20 MED ORDER — FAMOTIDINE 20 MG PO TABS: 40.0000 mg | ORAL_TABLET | Freq: Every evening | ORAL | 1 refills | Status: DC | PRN

## 2019-09-20 MED ORDER — ATORVASTATIN CALCIUM 10 MG PO TABS: 10.0000 mg | ORAL_TABLET | Freq: Every evening | ORAL | Status: DC

## 2019-09-20 MED ORDER — DILTIAZEM HCL ER COATED BEADS 120 MG PO CP24: 120.0000 mg | ORAL_CAPSULE | Freq: Every day | ORAL | Status: DC

## 2019-09-20 MED ORDER — MAGNESIUM OXIDE 400 (241.3 MG) MG PO TABS
400.0000 mg | ORAL_TABLET | Freq: Every day | ORAL | Status: DC
  Administered 2019-09-20: 400 mg via ORAL
  Filled 2019-09-20: qty 1

## 2019-09-20 MED ORDER — METRONIDAZOLE 0.75 % EX GEL
Freq: Two times a day (BID) | CUTANEOUS | Status: DC
  Filled 2019-09-20: qty 45

## 2019-09-20 MED ORDER — DOFETILIDE 500 MCG PO CAPS
500.0000 ug | ORAL_CAPSULE | Freq: Two times a day (BID) | ORAL | Status: DC
  Administered 2019-09-20: 500 ug via ORAL
  Filled 2019-09-20: qty 1

## 2019-09-20 MED ORDER — FAMOTIDINE 20 MG PO TABS: 40.0000 mg | ORAL_TABLET | Freq: Every day | ORAL | Status: DC | PRN

## 2019-09-20 MED ORDER — WARFARIN - PHARMACIST DOSING INPATIENT: Freq: Every day | Status: DC

## 2019-09-20 MED ORDER — PANTOPRAZOLE SODIUM 40 MG PO TBEC: 40.0000 mg | DELAYED_RELEASE_TABLET | Freq: Two times a day (BID) | ORAL | 2 refills | Status: DC

## 2019-09-20 MED ORDER — LOSARTAN POTASSIUM 50 MG PO TABS
100.0000 mg | ORAL_TABLET | Freq: Every day | ORAL | Status: DC
  Administered 2019-09-20: 100 mg via ORAL
  Filled 2019-09-20: qty 2

## 2019-09-20 MED ORDER — PROCHLORPERAZINE EDISYLATE 10 MG/2ML IJ SOLN: 5.0000 mg | INTRAMUSCULAR | Status: DC | PRN

## 2019-09-20 MED ORDER — WARFARIN SODIUM 5 MG PO TABS
5.0000 mg | ORAL_TABLET | Freq: Once | ORAL | Status: DC
  Filled 2019-09-20: qty 1

## 2019-09-20 MED ORDER — HYDRALAZINE HCL 25 MG PO TABS
50.0000 mg | ORAL_TABLET | Freq: Two times a day (BID) | ORAL | Status: DC
  Administered 2019-09-20: 50 mg via ORAL
  Filled 2019-09-20: qty 2

## 2019-09-20 MED ORDER — FAMOTIDINE 20 MG PO TABS: 40.0000 mg | ORAL_TABLET | Freq: Every day | ORAL | Status: DC

## 2019-09-20 MED ORDER — BUDESONIDE 0.5 MG/2ML IN SUSP
0.5000 mg | Freq: Every day | RESPIRATORY_TRACT | Status: DC
  Administered 2019-09-20: 0.5 mg via RESPIRATORY_TRACT
  Filled 2019-09-20: qty 2

## 2019-09-20 MED ORDER — UMECLIDINIUM-VILANTEROL 62.5-25 MCG/INH IN AEPB
1.0000 | INHALATION_SPRAY | Freq: Every day | RESPIRATORY_TRACT | Status: DC
  Filled 2019-09-20: qty 14

## 2019-09-20 MED ORDER — IPRATROPIUM-ALBUTEROL 0.5-2.5 (3) MG/3ML IN SOLN
3.0000 mL | Freq: Every day | RESPIRATORY_TRACT | Status: DC
  Administered 2019-09-20: 3 mL via RESPIRATORY_TRACT
  Filled 2019-09-20: qty 3

## 2019-09-20 MED ORDER — SIMETHICONE 80 MG PO CHEW: 80.0000 mg | CHEWABLE_TABLET | Freq: Four times a day (QID) | ORAL | Status: DC | PRN

## 2019-09-20 NOTE — Consult Note (Addendum)
Cardiology Consult    Patient ID: [name] [name]; [id number]; 1941-01-27   Admit date: 09/19/2019 Date of Consult: 09/20/2019  Primary Care Provider: Monico Blitz, MD Primary Cardiologist: [name] Lesches, MD  Primary Electrophysiologist: Dr. [name]  Patient Profile    [name] [name] is a 79 y.o. female with past medical history of paroxysmal atrial fibrillaton (complicated by intermittent tachy-brady episodes, failed Flecainide and on Tikosyn ), HTN, HLD, COPD and OSA who is being seen today for the evaluation of chest pain at the request of Dr. Dyann [name].   History of Present Illness    [name] [name] was last examined by [name] Palau, NP in 07/2019 and reported a few break-through episodes of atrial fibrillation and would use PRN Cardizem as needed. She reported her HR was typically in the 50's to 70's and she denied any associated dizziness or presyncope. She was continued on her current medication regimen with Tikosyn 500 mcg BID, Cardizem CD 120mg  daily and Coumadin for anticoagulation.   She called the office last week reporting intermittent palpitations and dyspnea with HR in the low-100's and took an extra PRN 30mg  Cardizem with improvement in her symptoms. She still had some dyspnea the following morning and was encouraged to take an extra Cardizem and proceed to the ED if symptoms did not improve.   She presented to [name] on 09/19/2019 for evaluation of chest pain. She reports last week that she was more short of breath and fatigued the morning following her atrial fibrillation episode last week. She had since been in her normal state of health until yesterday when she was undergoing a dental procedure and thinks she swallowed some of the lidocaine being used in the procedure. After this, she developed a burning sensation along her sternum which radiated into her back and started having frequent belching. No association with exertion or  positional changes. Symptoms started around 1300 and persisted until the evening, despite her taking Protonix and Pepcid. She talked with her neighbor who recommended she call EMS.   She reports her belching improved following a GI cocktail and she feels back to baseline this morning.   Initial labs show WBC 6.9, Hgb 11.0, platelets 215, Na+ 138, K+ 4.0 and creatinine 1.00. Initial and delta HS Troponin values have been negative. COVID negative. EKG shows sinus bradycardia, HR 59 with LAFB and no acute ST abnormalities when compared to prior tracings.   Past Medical History:  Diagnosis Date  . Allergic rhinitis   . Anxiety   . Asthma   . Atrial flutter ([place])   . COPD (chronic obstructive pulmonary disease) ([place])   . Depression   . Essential hypertension   . Hyperlipidemia   . Lumbar disc disease   . Obesity   . OSA (obstructive sleep apnea)    CPAP  . Paroxysmal atrial fibrillation (H[facility])    Element of tachycardia bradycardia syndrome  . Respiratory failure [name])     Past Surgical History:  Procedure Laterality Date  . ANTERIOR FUSION LUMBAR SPINE    . BIOPSY  08/27/2016   Procedure: BIOPSY;  Surgeon: Rogene Houston, MD;  Location: [facility];  Service: Endoscopy;;  FOUR GASTRIC POLYPS BIOPSIED  . CARDIOVERSION N/A 03/28/2014   Procedure: CARDIOVERSION;  Surgeon: Fay Records, MD;  Location: [facility] ORS;  Service: Cardiovascular;  Laterality: N/A;  . CARDIOVERSION N/A 10/22/2016   Procedure: CARDIOVERSION;  Surgeon: Sueanne Margarita, MD;  Location: [place];  Service: Cardiovascular;  Laterality: N/A;  . CATARACT EXTRACTION    . COLONOSCOPY N/A 08/04/2012   Procedure: COLONOSCOPY;  Surgeon: Rogene Houston, MD;  [facility];  Service: Endoscopy;  Laterality: N/A;  730-rescheduled to [place] notified pt  . ELECTROPHYSIOLOGIC STUDY N/A 09/18/2014   Procedure: Atrial Fibrillation Ablation;  Surgeon: Thompson Grayer, MD;  Location: [place] CV LAB;  Service:  Cardiovascular;  Laterality: N/A;  . ESOPHAGOGASTRODUODENOSCOPY N/A 08/27/2016   Procedure: ESOPHAGOGASTRODUODENOSCOPY (EGD);  Surgeon: Rogene Houston, MD;  [facility];  Service: Endoscopy;  Laterality: N/A;  1200  . LASIK     Both eyes  . TEE WITHOUT CARDIOVERSION N/A 09/18/2014   Procedure: TRANSESOPHAGEAL ECHOCARDIOGRAM (TEE);  Surgeon: Larey Dresser, MD;  Location: [place];  Service: Cardiovascular;  Laterality: N/A;  . TEE WITHOUT CARDIOVERSION N/A 10/22/2016   Procedure: TRANSESOPHAGEAL ECHOCARDIOGRAM (TEE);  Surgeon: Sueanne Margarita, MD;  Location: [facility] ENDOSCOPY;  Service: Cardiovascular;  Laterality: N/A;  . TEE WITHOUT CARDIOVERSION N/A 08/24/2017   Procedure: TRANSESOPHAGEAL ECHOCARDIOGRAM (TEE);  Surgeon: Josue Hector, MD;  Location: [facility] ENDOSCOPY;  Service: Cardiovascular;  Laterality: N/A;  . TOTAL HIP ARTHROPLASTY  2010   Right     Home Medications:  Prior to Admission medications   Medication Sig Start Date End Date Taking? Authorizing Provider  acetaminophen (TYLENOL) 500 MG tablet Take 500 mg by mouth daily as needed for headache.    [provider]  albuterol (PROAIR HFA) 108 (90 Base) MCG/ACT inhaler 2 puffs every 6 hours for wheezing as needed 04/12/18   Baird Lyons D, MD  antiseptic oral rinse (BIOTENE) LIQD 15 mLs by Mouth Rinse route as needed for dry mouth.    [provider]  atorvastatin (LIPITOR) 10 MG tablet Take 10 mg by mouth every evening.     [provider]  benzonatate (TESSALON) 200 MG capsule Take 1 capsule (200 mg total) by mouth 3 (three) times daily as needed for cough. 09/20/17   Lauraine Rinne, NP  Biotin 1 MG CAPS Take 1,000 mg by mouth 3 (three) times a week.     [provider]  budesonide (PULMICORT) 0.5 MG/2ML nebulizer solution Take 0.5 mg by nebulization daily.  08/13/11   Deneise Lever, MD  Calcium Carbonate-Vit D-Min (QC CALCIUM-MAGNESIUM-ZINC-D3) 333.4-133 MG-UNIT TABS Take 1 tablet by  mouth 2 (two) times daily.     [provider]  cetirizine (ZYRTEC) 10 MG tablet Take 10 mg by mouth daily as needed for allergies.    [provider]  clindamycin (CLEOCIN) 300 MG capsule 2 capsules as needed. Take prior to dental procedures 08/30/17   [provider]  DERMA-SMOOTHE/FS SCALP 0.01 % OIL APPLY A SMALL AMOUNT TO SKIN THREE TIMES A WEEK, PART HAIR APPLY TO SCALP, COVER WITH PLASTIC CAP, [place] OUT IN THE [name] 08/07/19   [provider]  Dextromethorphan-guaiFENesin (MUCINEX DM) 30-600 MG TB12 Take 1-2 tablets by mouth as needed.     [provider]  diltiazem (CARDIZEM) 30 MG tablet Take 1 tablet (30 mg total) by mouth as needed (for palpitations). 10/24/18   Sherran Needs, NP  diltiazem (CARTIA XT) 120 MG 24 hr capsule Take 1 capsule (120 mg total) by mouth at bedtime. 12/19/18   Sherran Needs, NP  dofetilide (TIKOSYN) 500 MCG capsule Take 1 capsule (500 mcg total) by mouth 2 (two) times daily. 08/08/19   Sherran Needs, NP  doxycycline (VIBRAMYCIN) 50 MG capsule Take 50  mg by mouth daily. 08/07/19   [provider]  DOXYCYCLINE PO Take by mouth. Taking one tablet by mouth daily    [provider]  famotidine (PEPCID) 20 MG tablet Take 2 tablets (40 mg total) by mouth at bedtime. Patient taking differently: Take 40 mg by mouth as needed.  05/02/19   Rehman, Mechele Dawley, MD  FLUAD 0.5 ML SUSY Inject as directed See admin instructions. 11/19/18   [provider]  fluticasone (FLONASE) 50 MCG/ACT nasal spray Place 1 spray into both nostrils 2 (two) times daily.    [provider]  hydrALAZINE (APRESOLINE) 50 MG tablet Take 50 mg by mouth 2 (two) times daily.    [provider]  HYDROCODONE-HOMATROPINE PO Taking by mouth prn for cough    [provider]  ipratropium-albuterol (DUONEB) 0.5-2.5 (3) MG/3ML SOLN Take 3 mLs by nebulization daily.     [provider]  ketoconazole (NIZORAL) 2  % shampoo APPLY SHAMPOO LEAVE ON SCALP FOR FIVE MINUTES, THEN RINSE OUT 08/07/19   [provider]  losartan (COZAAR) 100 MG tablet Take 1 tablet (100 mg total) by mouth daily. 05/31/19 08/29/19  Sherran Needs, NP  Magnesium 250 MG TABS Take 250 mg by mouth daily.    [provider]  metroNIDAZOLE (METROGEL) 1 % gel APPLY TO FACIAL RASH TWICE DAILY 08/07/19   [provider]  montelukast (SINGULAIR) 10 MG tablet Take 10 mg by mouth at bedtime.    [provider]  pantoprazole (PROTONIX) 40 MG tablet Take 1 tablet (40 mg total) by mouth daily before breakfast. 05/02/19   Rehman, Mechele Dawley, MD  potassium chloride SA (KLOR-CON) 20 MEQ tablet Take 1 tablet (20 mEq total) by mouth daily. 01/24/19   Sherran Needs, NP  sodium chloride (OCEAN) 0.65 % SOLN nasal spray Place 1 spray into both nostrils 2 (two) times daily.     [provider]  triamcinolone ointment (KENALOG) 0.1 % Apply 1 application topically daily as needed for rash. 06/01/17   [provider]  umeclidinium-vilanterol (ANORO ELLIPTA) 62.5-25 MCG/INH AEPB Inhale 1 puff into the lungs daily. 06/21/19   Baird Lyons D, MD  warfarin (COUMADIN) 5 MG tablet Take 5 mg by mouth as directed. 02/05/19   [provider]  Wheat Dextrin (BENEFIBER ON THE GO) POWD Take 1 Dose by mouth daily as needed (constipation). 2 tsp daily in the morning.    [provider]    Inpatient Medications: Scheduled Meds: . atorvastatin  10 mg Oral QPM  . budesonide  0.5 mg Nebulization Daily  . diltiazem  120 mg Oral QHS  . dofetilide  500 mcg Oral BID  . famotidine  40 mg Oral QHS  . hydrALAZINE  50 mg Oral BID  . ipratropium-albuterol  3 mL Nebulization Daily  . loratadine  10 mg Oral Daily  . losartan  100 mg Oral Daily  . magnesium oxide  400 mg Oral Daily  . metroNIDAZOLE   Topical BID  . pantoprazole  40 mg Oral BID   Continuous Infusions:  PRN Meds: acetaminophen **OR** acetaminophen,  benzonatate, bismuth subsalicylate, diltiazem, prochlorperazine, simethicone  Allergies:    Allergies  Allergen Reactions  . Bupropion Hcl Other (See Comments)    Suicidal Thoughts.   . Escitalopram Oxalate Other (See Comments)    Suicidal Thoughts.   . Fluticasone-Salmeterol Other (See Comments)    Advair - Caused patient to go into Afib.   Marland Kitchen Lisinopril Cough  .  Serevent Other (See Comments)    Caused patient to go into Afib    Social History:   Social History   Socioeconomic History  . Marital status: Single    Spouse name: Not on file  . Number of children: Not on file  . Years of education: Not on file  . Highest education level: Not on file  Occupational History  . Occupation: Retired: Presenter, broadcasting  Tobacco Use  . Smoking status: Never Smoker  . Smokeless tobacco: Never Used  Vaping Use  . Vaping Use: Never used  Substance and Sexual Activity  . Alcohol use: No    Alcohol/week: 0.0 standard drinks  . Drug use: No  . Sexual activity: Not on file  Other Topics Concern  . Not on file  Social History Narrative   Pt lives in [place] alone. She was never married.   Retired Customer service manager.   Attends [employer]   Social Determinants of Health   Financial Resource Strain:   . Difficulty of Paying Living Expenses:   Food Insecurity:   . Worried About Charity fundraiser in the Last Year:   . Arboriculturist in the Last Year:   Transportation Needs:   . Film/video editor (Medical):   Marland Kitchen Lack of Transportation (Non-Medical):   Physical Activity:   . Days of Exercise per Week:   . Minutes of Exercise per Session:   Stress:   . Feeling of Stress :   Social Connections:   . Frequency of Communication with Friends and Family:   . Frequency of Social Gatherings with Friends and Family:   . Attends Religious Services:   . Active Member of Clubs or Organizations:   . Attends Archivist Meetings:   Marland Kitchen Marital Status:   Intimate  Partner Violence:   . Fear of Current or Ex-Partner:   . Emotionally Abused:   Marland Kitchen Physically Abused:   . Sexually Abused:      Family History:    Family History  Problem Relation Age of Onset  . Cancer Mother   . Heart attack Father   . Colon cancer Other       Review of Systems    General:  No chills, fever, night sweats or weight changes.  Cardiovascular:  No edema, orthopnea, palpitations, paroxysmal nocturnal dyspnea. Positive for chest pain and dyspnea on exertion.  Dermatological: No rash, lesions/masses Respiratory: No cough, dyspnea Urologic: No hematuria, dysuria Abdominal:   No nausea, vomiting, diarrhea, bright red blood per rectum, melena, or hematemesis Neurologic:  No visual changes, wkns, changes in mental status. All other systems reviewed and are otherwise negative except as noted above.  Physical Exam/Data    Vitals:   09/20/19 0208 09/20/19 0604 09/20/19 0800 09/20/19 0817  BP: (!) 167/78 (!) 161/67 (!) 171/63   Pulse: (!) 55 (!) 51 (!) 48   Resp:  12 18   Temp: 97.9 F (36.6 C) 98.2 F (36.8 C) (!) 97.4 F (36.3 C)   TempSrc: Oral Oral Oral   SpO2: 97% 98% 100% 99%  Weight: 86.5 kg     Height: 5\' 3"  (1.6 m)      No intake or output data in the 24 hours ending 09/20/19 1031 Filed Weights   09/19/19 2203 09/20/19 0208  Weight: 86.6 kg 86.5 kg   Body mass index is 33.76 kg/m.   General: Pleasant female appearing in NAD Psych: Normal affect. Neuro: Alert and oriented X 3.  Moves all extremities spontaneously. HEENT: Normal  Neck: Supple without bruits or JVD. Lungs:  Resp regular and unlabored, CTA without wheezing or rales. Heart: RRR no s3, s4, or murmurs. Abdomen: Soft, non-tender, non-distended, BS + x 4.  Extremities: No clubbing, cyanosis or edema. DP/PT/Radials 2+ and equal bilaterally.   EKG:  The EKG was personally reviewed and demonstrates:  Sinus bradycardia, HR 59 with LAFB and no acute ST abnormalities when compared to prior  tracings.    Labs/Studies     Relevant CV Studies:  Event Monitor: 05/2016 7 day event recorder reviewed. Lead artifact inhibits interpretation, although rhythm looks to be predominantly sinus with PACs. Cannot clearly exclude episodes of rate-controlled atrial fibrillation. No marked episodes of bradycardia noted, no pauses. Heart rate generally in the 60s to 70s.   TEE: 07/2017 Study Conclusions   - Left ventricle: Systolic function was normal. The estimated  ejection fraction was in the range of 55% to 60%.  - Mitral valve: There was mild regurgitation.  - Left atrium: The atrium was dilated. No evidence of thrombus in  the atrial cavity or appendage. No evidence of thrombus in the  appendage.  - Right atrium: No evidence of thrombus in the atrial cavity or  appendage.  - Atrial septum: No defect or patent foramen ovale was identified.  - Tricuspid valve: There was mild-moderate regurgitation.  - Impressions: Patient had TEE prior to Tikosyn load to r/o LAA  thrombus  No thrombus present    Moderate sedation : 30 minutes 5 mg versed 50 ug of fentanyl    3D rendering of atrial septum and mitral valve performed   Impressions:   - Patient had TEE prior to Tikosyn load to r/o LAA thrombus  No thrombus present     Laboratory Data:  Chemistry Recent Labs  Lab 09/19/19 2219  NA 138  K 4.0  CL 106  CO2 22  GLUCOSE 104*  BUN 19  CREATININE 1.00  CALCIUM 9.1  GFRNONAA 54*  GFRAA >60  ANIONGAP 10    Recent Labs  Lab 09/19/19 2219  PROT 7.0  ALBUMIN 3.8  AST 29  ALT 17  ALKPHOS 68  BILITOT 0.5   Hematology Recent Labs  Lab 09/19/19 2219  WBC 6.9  RBC 4.31  HGB 11.0*  HCT 36.0  MCV 83.5  MCH 25.5*  MCHC 30.6  RDW 16.3*  PLT 215   Cardiac EnzymesNo results for input(s): TROPONINI in the last 168 hours. No results for input(s): TROPIPOC in the last 168 hours.  BNPNo results for input(s): BNP, PROBNP in the last 168 hours.    DDimer No results for input(s): DDIMER in the last 168 hours.  Radiology/Studies:  DG Chest 2 View  Result Date: 09/19/2019 CLINICAL DATA:  Chest tightness EXAM: CHEST - 2 VIEW COMPARISON:  08/08/2019 FINDINGS: Unchanged mild cardiomegaly. No focal airspace consolidation or pulmonary edema. No pleural effusion or pneumothorax. IMPRESSION: Mild cardiomegaly without focal airspace disease. Electronically Signed   By: Ulyses Jarred M.D.   On: 09/19/2019 22:45     Assessment & Plan    1. Chest Pain with Atypical Features - She developed chest pain yesterday following a dental procedure during which she thinks she swallowed Lidocaine. Developed chest pressure and belching which lasted for 5+ hours. Not associated with exertion or positional changes. She does report having worsening dyspnea and fatigue last week following her atrial fibrillation episode which was unusual for her.  - Initial and delta HS Troponin values  have been negative. EKG shows sinus bradycardia, HR 59 with LAFB and no acute ST abnormalities when compared to prior tracings.  - An echocardiogram is pending to assess LV function and wall motion. If no significant abnormalities, would not anticipate further inpatient testing. She does have multiple cardiac risk factors (HTN, HLD, family history of CAD and 3-vessel coronary artery calcification by CT in 2018). Given that her last stress test was in 2012, she would benefit from a repeat [place] for ischemic evaluation. Will discuss with Dr. Harl Bowie but anticipate this could be performed as an outpatient if echo is reassuring.   2. Paroxysmal Atrial Fibrillation - she has mostly been in NSR this admission with brief episodes of atrial tachycardia. She does experience some episodes of palpitations as an outpatient which resolve with PRN Cardizem 30mg .  - continue Tikosyn 500 mcg BID and Cardizem CD 120mg  daily.  - she denies any evidence of active bleeding. Hgb stable at 11.0.  Remains on Coumadin for anticoagulation and INR at 2.0 on admission.   3. HTN - BP elevated at 171/63 on most recent check. Her PTA Hydralazine and Losartan were not ordered on admission. Ordering now. Will continue Cardizem CD 120mg  daily.   4. HLD - Followed by PCP. She remains on Atorvastatin 10mg  daily.   5. OSA - Continued compliance with CPAP encouraged.    For questions or updates, please contact [place] Please consult [email or web address] for contact info under Cardiology/STEMI.  Signed, Erma Heritage, PA-C 09/20/2019, 10:31 AM Pager: [phone]  Patient seen and discussed with PA Ahmed Prima, I agree with her documentation. 79 yo female history of afib, COPD, HTN, HL, OSA admitted with chest pain.   Atypical symptoms associated with belching, lasting several hours that started after she thinks she swallowed some lidocaine during a dental procedure. Symptoms have resolved overnight without recurrence.    K 4 Cr 1 BUN 19 WBC 6.9 Hgb 11 Plt 215 INR 2 hstrop 7-->6 EKG sinus brady 58, LAFB, no acute ischemic changes CXR mild cardiomegaly, no acute process  Atypical symptoms most likely GI etiology, no objective evidence of ischemia by EKG or enzymes. F/u echo, if benign then no further cardiac workup   Carlyle Dolly MD

## 2019-09-20 NOTE — Progress Notes (Signed)
*  PRELIMINARY RESULTS* Echocardiogram 2D Echocardiogram has been performed.  [name] 09/20/2019, 11:41 AM

## 2019-09-20 NOTE — H&P (Signed)
History and Physical    [name] [name]:[id number] DOB: [birth year] DOA: 09/19/2019  PCP: Monico Blitz, MD  Patient coming from: Home.  I have personally briefly reviewed patient's old medical records in [place]  Chief Complaint: Chest pain.  HPI: [name] [name] is a 79 y.o. 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  She felt a little nauseated at times, has been belching often and feels like her symptoms are from acid reflux, but states that this is not her usual symptomatology when her GERD acting up.  She denies emesis, diarrhea, melena or hematochezia.  She occasionally gets constipated.  No dysuria, frequency or hematuria.  Denies polyuria, polydipsia, polyphagia or blurred vision.  She sees Dr. Laural Golden for GERD.  ED Course: Initial vital signs were temperature 98.1 F, pulse 65, respirations 16, blood pressure 172/56 mmHg and O2 sat 99% on room air.  The patient was given 325 mg of chewable aspirin along with Maalox plus viscous lidocaine.  She reported some improvement from it.  CBC showed a white count 6.9, hemoglobin 11.0 g/dL and platelets 215.  PT was 22.4 and INR 2.0.  BMP showed a mildly elevated glucose at 104 mg/dL, but was otherwise unremarkable.  Hepatic function panel was normal.  Troponin was 7 and then 6 ng/L.  EKG was sinus bradycardia at 59 bpm and with LAFB.  Her chest radiograph showed mild cardiomegaly with no focal airspace disease.  Review of Systems: As per HPI otherwise all other systems reviewed and are negative.  Past Medical History:   Diagnosis Date  . Allergic rhinitis   . Anxiety   . Asthma   . Atrial flutter ([place])   . COPD (chronic obstructive pulmonary disease) ([place])   . Depression   . Essential hypertension   . Hyperlipidemia   . Lumbar disc disease   . Obesity   . OSA (obstructive sleep apnea)    CPAP  . Paroxysmal atrial fibrillation (HCC)    Element of tachycardia bradycardia syndrome  . Respiratory failure [facility])     Past Surgical History:  Procedure Laterality Date  . ANTERIOR FUSION LUMBAR SPINE    . BIOPSY  08/27/2016   Procedure: BIOPSY;  Surgeon: Rogene Houston, MD;  [facility];  Service: Endoscopy;;  FOUR GASTRIC POLYPS BIOPSIED  . CARDIOVERSION N/A 03/28/2014   Procedure: CARDIOVERSION;  Surgeon: Fay Records, MD;  Location: [facility] ORS;  Service: Cardiovascular;  Laterality: N/A;  . CARDIOVERSION N/A 10/22/2016   Procedure: CARDIOVERSION;  Surgeon: Sueanne Margarita, MD;  Location: [facility] ENDOSCOPY;  Service: Cardiovascular;  Laterality: N/A;  . CATARACT EXTRACTION    . COLONOSCOPY N/A 08/04/2012   Procedure: COLONOSCOPY;  Surgeon: Rogene Houston, MD;  Location[facility];  Service: Endoscopy;  Laterality: N/A;  730-rescheduled to [place] notified pt  . ELECTROPHYSIOLOGIC STUDY N/A 09/18/2014   Procedure: Atrial Fibrillation Ablation;  Surgeon: Thompson Grayer, MD;  Location: [place] CV LAB;  Service: Cardiovascular;  Laterality: N/A;  . ESOPHAGOGASTRODUODENOSCOPY N/A 08/27/2016   Procedure: ESOPHAGOGASTRODUODENOSCOPY (EGD);  Surgeon: Rogene Houston, MD;  [facility];  Service: Endoscopy;  Laterality: N/A;  1200  . LASIK  Both eyes  . TEE WITHOUT CARDIOVERSION N/A 09/18/2014   Procedure: TRANSESOPHAGEAL ECHOCARDIOGRAM (TEE);  Surgeon: Larey Dresser, MD;  Location: [place];  Service: Cardiovascular;  Laterality: N/A;  . TEE WITHOUT CARDIOVERSION N/A 10/22/2016   Procedure: TRANSESOPHAGEAL ECHOCARDIOGRAM (TEE);  Surgeon: Sueanne Margarita, MD;  Location: [facility] ENDOSCOPY;   Service: Cardiovascular;  Laterality: N/A;  . TEE WITHOUT CARDIOVERSION N/A 08/24/2017   Procedure: TRANSESOPHAGEAL ECHOCARDIOGRAM (TEE);  Surgeon: Josue Hector, MD;  Location: [facility] ENDOSCOPY;  Service: Cardiovascular;  Laterality: N/A;  . TOTAL HIP ARTHROPLASTY  2010   Right    Social History  reports that she has never smoked. She has never used smokeless tobacco. She reports that she does not drink alcohol and does not use drugs.  Allergies  Allergen Reactions  . Bupropion Hcl Other (See Comments)    Suicidal Thoughts.   . Escitalopram Oxalate Other (See Comments)    Suicidal Thoughts.   . Fluticasone-Salmeterol Other (See Comments)    Advair - Caused patient to go into Afib.   Marland Kitchen Lisinopril Cough  . Serevent Other (See Comments)    Caused patient to go into Afib    Family History  Problem Relation Age of Onset  . Cancer Mother   . Heart attack Father   . Colon cancer Other    Prior to Admission medications   Medication Sig Start Date End Date Taking? Authorizing Provider  acetaminophen (TYLENOL) 500 MG tablet Take 500 mg by mouth daily as needed for headache.    [provider]  albuterol (PROAIR HFA) 108 (90 Base) MCG/ACT inhaler 2 puffs every 6 hours for wheezing as needed 04/12/18   Baird Lyons D, MD  antiseptic oral rinse (BIOTENE) LIQD 15 mLs by Mouth Rinse route as needed for dry mouth.    [provider]  atorvastatin (LIPITOR) 10 MG tablet Take 10 mg by mouth every evening.     [provider]  benzonatate (TESSALON) 200 MG capsule Take 1 capsule (200 mg total) by mouth 3 (three) times daily as needed for cough. 09/20/17   Lauraine Rinne, NP  Biotin 1 MG CAPS Take 1,000 mg by mouth 3 (three) times a week.     [provider]  budesonide (PULMICORT) 0.5 MG/2ML nebulizer solution Take 0.5 mg by nebulization daily.  08/13/11   Deneise Lever, MD  Calcium Carbonate-Vit D-Min (QC CALCIUM-MAGNESIUM-ZINC-D3) 333.4-133 MG-UNIT TABS Take 1  tablet by mouth 2 (two) times daily.     [provider]  cetirizine (ZYRTEC) 10 MG tablet Take 10 mg by mouth daily as needed for allergies.    [provider]  clindamycin (CLEOCIN) 300 MG capsule 2 capsules as needed. Take prior to dental procedures 08/30/17   [provider]  DERMA-SMOOTHE/FS SCALP 0.01 % OIL APPLY A SMALL AMOUNT TO SKIN THREE TIMES A WEEK, PART HAIR APPLY TO SCALP, COVER WITH PLASTIC CAP, [place] OUT IN THE [facility] 08/07/19   [provider]  Dextromethorphan-guaiFENesin (MUCINEX DM) 30-600 MG TB12 Take 1-2 tablets by mouth as needed.     [provider]  diltiazem (CARDIZEM) 30 MG tablet Take 1 tablet (30 mg total) by mouth as needed (for palpitations). 10/24/18   Sherran Needs, NP  diltiazem (CARTIA XT) 120 MG 24 hr capsule Take 1 capsule (120 mg total) by mouth at bedtime. 12/19/18   Sherran Needs, NP  dofetilide (TIKOSYN) 500 MCG capsule Take 1 capsule (500 mcg total) by mouth 2 (two) times  daily. 08/08/19   Sherran Needs, NP  doxycycline (VIBRAMYCIN) 50 MG capsule Take 50 mg by mouth daily. 08/07/19   [provider]  DOXYCYCLINE PO Take by mouth. Taking one tablet by mouth daily    [provider]  famotidine (PEPCID) 20 MG tablet Take 2 tablets (40 mg total) by mouth at bedtime. Patient taking differently: Take 40 mg by mouth as needed.  05/02/19   Rehman, Mechele Dawley, MD  FLUAD 0.5 ML SUSY Inject as directed See admin instructions. 11/19/18   [provider]  fluticasone (FLONASE) 50 MCG/ACT nasal spray Place 1 spray into both nostrils 2 (two) times daily.    [provider]  hydrALAZINE (APRESOLINE) 50 MG tablet Take 50 mg by mouth 2 (two) times daily.    [provider]  HYDROCODONE-HOMATROPINE PO Taking by mouth prn for cough    [provider]  ipratropium-albuterol (DUONEB) 0.5-2.5 (3) MG/3ML SOLN Take 3 mLs by nebulization daily.     [provider]  ketoconazole  (NIZORAL) 2 % shampoo APPLY SHAMPOO LEAVE ON SCALP FOR FIVE MINUTES, THEN RINSE OUT 08/07/19   [provider]  losartan (COZAAR) 100 MG tablet Take 1 tablet (100 mg total) by mouth daily. 05/31/19 08/29/19  Sherran Needs, NP  Magnesium 250 MG TABS Take 250 mg by mouth daily.    [provider]  metroNIDAZOLE (METROGEL) 1 % gel APPLY TO FACIAL RASH TWICE DAILY 08/07/19   [provider]  montelukast (SINGULAIR) 10 MG tablet Take 10 mg by mouth at bedtime.    [provider]  pantoprazole (PROTONIX) 40 MG tablet Take 1 tablet (40 mg total) by mouth daily before breakfast. 05/02/19   Rehman, Mechele Dawley, MD  potassium chloride SA (KLOR-CON) 20 MEQ tablet Take 1 tablet (20 mEq total) by mouth daily. 01/24/19   Sherran Needs, NP  sodium chloride (OCEAN) 0.65 % SOLN nasal spray Place 1 spray into both nostrils 2 (two) times daily.     [provider]  triamcinolone ointment (KENALOG) 0.1 % Apply 1 application topically daily as needed for rash. 06/01/17   [provider]  umeclidinium-vilanterol (ANORO ELLIPTA) 62.5-25 MCG/INH AEPB Inhale 1 puff into the lungs daily. 06/21/19   Baird Lyons D, MD  warfarin (COUMADIN) 5 MG tablet Take 5 mg by mouth as directed. 02/05/19   [provider]  Wheat Dextrin (BENEFIBER ON THE GO) POWD Take 1 Dose by mouth daily as needed (constipation). 2 tsp daily in the morning.    [provider]    Physical Exam: Vitals:   09/19/19 2203 09/19/19 2207 09/19/19 2353 09/19/19 2354  BP:  (!) 172/56 (!) 144/62 140/64  Pulse:  65    Resp:  16    Temp:  98.1 F (36.7 C)    SpO2:  99%    Weight: 86.6 kg     Height: 5\' 3"  (1.6 m)      Constitutional: NAD, calm, comfortable Eyes: PERRL, lids and conjunctivae normal ENMT: Mucous membranes are moist. Posterior pharynx clear of any exudate or lesions. Neck: normal, supple, no masses, no thyromegaly Respiratory: clear to auscultation bilaterally, no wheezing,  no crackles. Normal respiratory effort. No accessory muscle use.  Cardiovascular: Bradycardic cuff 57 bpm, no murmurs / rubs / gallops. No extremity edema. 2+ pedal pulses. No carotid bruits.  Abdomen: Obese, nondistended.  BS positive.  Soft, mild epigastric discomfort on palpation (patient does not describes it as tenderness), no masses palpated. No  hepatosplenomegaly. Musculoskeletal: no clubbing / cyanosis.  Good ROM, no contractures. Normal muscle tone.  Skin: Some areas of ecchymosis on extremities. Neurologic: CN 2-12 grossly intact. Sensation intact, DTR normal. Strength 5/5 in all 4.  Psychiatric: Normal judgment and insight. Alert and oriented x 3. Normal mood.   Labs on Admission: I have personally reviewed following labs and imaging studies  CBC: Recent Labs  Lab 09/19/19 2219  WBC 6.9  HGB 11.0*  HCT 36.0  MCV 83.5  PLT 413    Basic Metabolic Panel: Recent Labs  Lab 09/19/19 2219  NA 138  K 4.0  CL 106  CO2 22  GLUCOSE 104*  BUN 19  CREATININE 1.00  CALCIUM 9.1    GFR: Estimated Creatinine Clearance: 47.6 mL/min (by C-G formula based on SCr of 1 mg/dL).  Liver Function Tests: Recent Labs  Lab 09/19/19 2219  AST 29  ALT 17  ALKPHOS 68  BILITOT 0.5  PROT 7.0  ALBUMIN 3.8    Urine analysis: No results found for: COLORURINE, APPEARANCEUR, LABSPEC, [place], GLUCOSEU, HGBUR, BILIRUBINUR, KETONESUR, PROTEINUR, UROBILINOGEN, NITRITE, LEUKOCYTESUR  Radiological Exams on Admission: DG Chest 2 View  Result Date: 09/19/2019 CLINICAL DATA:  Chest tightness EXAM: CHEST - 2 VIEW COMPARISON:  08/08/2019 FINDINGS: Unchanged mild cardiomegaly. No focal airspace consolidation or pulmonary edema. No pleural effusion or pneumothorax. IMPRESSION: Mild cardiomegaly without focal airspace disease. Electronically Signed   By: Ulyses Jarred M.D.   On: 09/19/2019 22:45   Echo TEE 08/25/2017 -------------------------------------------------------------------  LV EF: 55% -   60%   -------------------------------------------------------------------  Indications:   Atrial fibrillation - chronic 427.31.   -------------------------------------------------------------------  Study Conclusions   - Left ventricle: Systolic function was normal. The estimated  ejection fraction was in the range of 55% to 60%.  - Mitral valve: There was mild regurgitation.  - Left atrium: The atrium was dilated. No evidence of thrombus in  the atrial cavity or appendage. No evidence of thrombus in the  appendage.  - Right atrium: No evidence of thrombus in the atrial cavity or  appendage.  - Atrial septum: No defect or patent foramen ovale was identified.  - Tricuspid valve: There was mild-moderate regurgitation.  - Impressions: Patient had TEE prior to Tikosyn load to r/o LAA  thrombus  No thrombus present    Moderate sedation : 30 minutes 5 mg versed 50 ug of fentanyl    3D rendering of atrial septum and mitral valve performed   Impressions:  - Patient had TEE prior to Tikosyn load to r/o LAA thrombus  No thrombus present  Moderate sedation : 30 minutes 5 mg versed 50 ug of fentanyl  3D rendering of atrial septum and mitral valve performed No  cardiac source of emboli was indentified.  EKG: Independently reviewed. Vent. rate 59 BPM PR interval * ms QRS duration 97 ms QT/QTc 475/471 ms P-R-T axes -72 -40 57 Sinus bradycardia Left anterior fascicular block Baseline wander in lead(s) V2  Assessment/Plan Principal Problem:   Chest pain Likely GI in origin (frequent belching). Some improvement with ED treatment. Maalox and viscous lidocaine not very effective. Will try Pepto-Bismol and simethicone. Consider GI evaluation if symptoms persist.  Active Problems:   Paroxysmal atrial fibrillation (HCC) CHA?DS?-VASc Score ovale is 4. Continue diltiazem 120 mg p.o. daily. Continue Tikosyn 500 mcg p.o. 2 times daily. Warfarin per  pharmacy.    Asthma, mild intermittent Supplemental oxygen as needed. Bronchodilators as needed. Continue monthly mepolizumab.    Gastroesophageal reflux disease Continue  PPI. Continue famotidine as needed.    Normocytic anemia Monitor H&H.    Hypertension Continue Cardizem 120 mg p.o. at bedtime. Monitor blood pressure and heart rate.    DVT prophylaxis: On warfarin. Code Status:   Full code. Family Communication: Disposition Plan:   Patient is from:  Home.  Anticipated DC to:  Home.  Anticipated DC date:  09/20/2018 or 09/21/2019.  Anticipated DC barriers: Clinical improvement.  Consults called: Admission status:  Observation/telemetry.  Severity of Illness:  Reubin Milan MD [facility]  How to contact the [facility] Attending or Consulting provider [place] or covering provider during after hours [place], for this patient?   1. Check the care team in [facility] and look for a) attending/consulting [facility] provider listed and b) the [facility] team listed 2. Log into [email or web address] and use [place]'s universal password to access. If you do not have the password, please contact the hospital operator. 3. Locate the [facility] provider you are looking for under [facility] and page to a number that you can be directly reached. 4. If you still have difficulty reaching the provider, please page the [facility] (Director on Call) for the Hospitalists listed on amion for assistance.  09/20/2019, 1:35 AM   This document was prepared using Dragon voice recognition software and may contain some unintended transcription errors.

## 2019-09-20 NOTE — Discharge Summary (Signed)
Physician Discharge Summary  JACOBY RITSEMA OJJ:[id number] DOB: 12-06-1940 DOA: 09/19/2019  PCP: Monico Blitz, MD  Admit date: 09/19/2019 Discharge date: 09/20/2019  Time spent: 30 minutes  Recommendations for Outpatient Follow-up:  1. Repeat CBC to follow hemoglobin trend 2. Continue to closely monitor patient's INR 3. Repeat basic metabolic panel to evaluate lites and renal function.   Discharge Diagnoses:  Principal Problem:   Chest pain Active Problems:   Paroxysmal atrial fibrillation (HCC)   Asthma, mild intermittent   Class 1 obesity due to excess calories with body mass index (BMI) of 33.0 to 33.9 in adult   Gastroesophageal reflux disease   Normocytic anemia   Hypertension   Discharge Condition: Stable and improved.  Discharged home with instruction to follow-up with PCP in 10 days.  CODE STATUS: Full code.  Diet recommendation: Heart healthy/low calorie diet.  Filed Weights   09/19/19 2203 09/20/19 0208  Weight: 86.6 kg 86.5 kg    History of present illness:  As per H&P written by Dr. Olevia Bowens on 09/20/2019 79 y.o. 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  She felt a little nauseated at times, has been belching often and feels like her symptoms are from acid reflux, but states that this is not her usual symptomatology when her GERD acting up.  She denies emesis, diarrhea, melena or hematochezia.  She occasionally gets constipated.  No dysuria, frequency or hematuria.  Denies polyuria, polydipsia, polyphagia or blurred vision.  She sees Dr. Laural Golden for  GERD.  ED Course: Initial vital signs were temperature 98.1 F, pulse 65, respirations 16, blood pressure 172/56 mmHg and O2 sat 99% on room air.  The patient was given 325 mg of chewable aspirin along with Maalox plus viscous lidocaine.  She reported some improvement from it.  CBC showed a white count 6.9, hemoglobin 11.0 g/dL and platelets 215.  PT was 22.4 and INR 2.0.  BMP showed a mildly elevated glucose at 104 mg/dL, but was otherwise unremarkable.  Hepatic function panel was normal.  Troponin was 7 and then 6 ng/L.  EKG was sinus bradycardia at 59 bpm and with LAFB.  Her chest radiograph showed mild cardiomegaly with no focal airspace disease.  Hospital Course:  1-atypical chest pain: Noncardiac in origin -Negative troponin and no acute ischemic changes on EKG -No abnormalities appreciated on telemetry and reassuring 2D echo -Case discussed with cardiology service who saw patient in evaluation and recommended no further inpatient work-up. -Continue management for GI symptoms. -Patient will resume prior to admission heart medications and the use of Coumadin for secondary prevention.  2-paroxysmal atrial fibrillation -ChadsVASC score around 4 -Continue diltiazem and Tikosyn -Continue warfarin.  3-gastroesophageal reflux disease -Continue PPI, dose adjusted to twice a day for better control -As needed famotidine nightly also prescribed. -Lifestyle changes discussed with patient. -If symptoms recur or fail to further improve outpatient GI evaluation is recommended.  4-hypertension -Is stable and well-controlled -Continue current antihypertensive regimen and low-sodium diet.  5-asthma, mild intermittent -Continue as needed bronchodilators -Continue monthly mepolizumab -No wheezing or shortness of breath reported currently.  6-class I obesity -Body mass index is 33.76 kg/m. -Low calorie diet, portion control increase physical activity discussed with  patient.   Procedures: 2-D echo: 1. Left ventricular ejection fraction, by estimation, is 60 to 65%. The  left ventricle has normal function. The left ventricle has no regional  wall motion abnormalities. There is moderate left ventricular hypertrophy.  Left ventricular diastolic  parameters are consistent with Grade II diastolic dysfunction  (pseudonormalization). Elevated left atrial pressure.  2. Right ventricular systolic function is normal. The right ventricular  size is normal. There is mildly elevated pulmonary artery systolic  pressure.  3. Left atrial size was severely dilated.  4. The mitral valve is normal in structure. Trivial mitral valve  regurgitation. No evidence of mitral stenosis.  5. The aortic valve is tricuspid. Aortic valve regurgitation is not  visualized. No aortic stenosis is present.  6. Indeterminant PASP, inadequate TR jet.  7. The inferior vena cava is dilated in size with >50% respiratory  variability, suggesting right atrial pressure of 8 mmHg.   Consultations:  cardiology service  Discharge Exam: Vitals:   09/20/19 0817 09/20/19 1322  BP:  (!) 161/62  Pulse:  (!) 57  Resp:  20  Temp:  98 F (36.7 C)  SpO2: 99% 99%    General: No fever, no chest pain, no palpitations, no nausea or vomiting.  Patient feels ready to go home. Cardiovascular: S1 and S2, no rubs, no gallops, no JVD appreciated on exam. Respiratory: Good air movement bilaterally, normal respiratory effort, good O2 sat on room air. Abdomen: Soft, nontender, positive bowel sounds, no guarding. Extremities: No cyanosis or clubbing.  Discharge Instructions   Discharge Instructions    Diet - low sodium heart healthy   Complete by: As directed    Discharge instructions   Complete by: As directed    Take medications as prescribed Arrange follow-up with PCP in 10 days Follow heart healthy diet Maintain adequate hydration. Remember to keep yourself in an upright position  at least for 30 minutes after eating and no go to bed at bedtime after 2-hour has passed from dinner.     Allergies as of 09/20/2019      Reactions   Bupropion Hcl Other (See Comments)   Suicidal Thoughts.    Escitalopram Oxalate Other (See Comments)   Suicidal Thoughts.    Fluticasone-salmeterol Other (See Comments)   Advair - Caused patient to go into Afib.    Lisinopril Cough   Serevent Other (See Comments)   Caused patient to go into Afib      Medication List    TAKE these medications   acetaminophen 500 MG tablet Commonly known as: TYLENOL Take 500 mg by mouth daily as needed for headache.   albuterol 108 (90 Base) MCG/ACT inhaler Commonly known as: ProAir HFA 2 puffs every 6 hours for wheezing as needed   Anoro Ellipta 62.5-25 MCG/INH Aepb Generic drug: umeclidinium-vilanterol Inhale 1 puff into the lungs daily.   antiseptic oral rinse Liqd 15 mLs by Mouth Rinse route as needed for dry mouth.   atorvastatin 10 MG tablet Commonly known as: LIPITOR Take 10 mg by mouth every evening.   Ayr Saline Nasal Neti Rinse 1.57 g Pack Generic drug: Sodium Chloride-Sodium Bicarb Place 1 each into the nose daily as needed (rinse nasal often).   Benefiber On The GO Powd Take 1 Dose by mouth daily as needed (constipation). 2 tsp daily in the morning.   benzonatate 200 MG capsule Commonly known as: TESSALON Take 1 capsule (200 mg total) by mouth 3 (three) times daily as needed for cough.  Biotin 1 MG Caps Take 1,000 mg by mouth 3 (three) times a week.   budesonide 0.5 MG/2ML nebulizer solution Commonly known as: PULMICORT Take 0.5 mg by nebulization daily.   cetirizine 10 MG tablet Commonly known as: ZYRTEC Take 10 mg by mouth daily as needed for allergies.   clindamycin 300 MG capsule Commonly known as: CLEOCIN 2 capsules as needed. Take prior to dental procedures   Derma-Smoothe/FS Scalp 0.01 % Oil Generic drug: Fluocinolone Acetonide Scalp APPLY A SMALL AMOUNT  TO SKIN THREE TIMES A WEEK, PART HAIR APPLY TO SCALP, COVER WITH PLASTIC CAP, WASH OUT IN THE MONING   diltiazem 120 MG 24 hr capsule Commonly known as: Cartia XT Take 1 capsule (120 mg total) by mouth at bedtime.   diltiazem 30 MG tablet Commonly known as: CARDIZEM Take 1 tablet (30 mg total) by mouth as needed (for palpitations).   dofetilide 500 MCG capsule Commonly known as: TIKOSYN Take 1 capsule (500 mcg total) by mouth 2 (two) times daily.   doxycycline 50 MG capsule Commonly known as: VIBRAMYCIN Take 50 mg by mouth daily as needed (rosacea).   famotidine 20 MG tablet Commonly known as: Pepcid Take 2 tablets (40 mg total) by mouth at bedtime as needed. What changed:   when to take this  reasons to take this   Fluad 0.5 ML Susy Generic drug: Influenza Vac A&B Surf Ant Adj Inject as directed See admin instructions.   fluticasone 50 MCG/ACT nasal spray Commonly known as: FLONASE Place 1 spray into both nostrils 2 (two) times daily.   hydrALAZINE 100 MG tablet Commonly known as: APRESOLINE Take 100 mg by mouth 2 (two) times daily.   HYDROCODONE-HOMATROPINE PO Take 5 mLs by mouth daily as needed (cough). Taking by mouth prn for cough   ipratropium-albuterol 0.5-2.5 (3) MG/3ML Soln Commonly known as: DUONEB Take 3 mLs by nebulization daily.   ketoconazole 2 % shampoo Commonly known as: NIZORAL Apply 1 application topically 3 (three) times a week.   losartan 100 MG tablet Commonly known as: COZAAR Take 1 tablet (100 mg total) by mouth daily.   Magnesium 250 MG Tabs Take 250 mg by mouth daily.   metroNIDAZOLE 1 % gel Commonly known as: METROGEL APPLY TO FACIAL RASH TWICE DAILY   montelukast 10 MG tablet Commonly known as: SINGULAIR Take 10 mg by mouth at bedtime.   Mucinex DM 30-600 MG Tb12 Take 1-2 tablets by mouth as needed.   pantoprazole 40 MG tablet Commonly known as: PROTONIX Take 1 tablet (40 mg total) by mouth 2 (two) times daily. What  changed: when to take this   potassium chloride SA 20 MEQ tablet Commonly known as: KLOR-CON Take 1 tablet (20 mEq total) by mouth daily.   QC Calcium-Magnesium-Zinc-D3 333.4-133 MG-UNIT Tabs Take 1 tablet by mouth 2 (two) times daily.   triamcinolone ointment 0.1 % Commonly known as: KENALOG Apply 1 application topically daily as needed for rash.   warfarin 5 MG tablet Commonly known as: COUMADIN Take 5 mg by mouth as directed.      Allergies  Allergen Reactions  . Bupropion Hcl Other (See Comments)    Suicidal Thoughts.   . Escitalopram Oxalate Other (See Comments)    Suicidal Thoughts.   . Fluticasone-Salmeterol Other (See Comments)    Advair - Caused patient to go into Afib.   Marland Kitchen Lisinopril Cough  . Serevent Other (See Comments)    Caused patient to go into Afib    Follow-up Information  Monico Blitz, MD. Schedule an appointment as soon as possible for a visit in 10 day(s).   Specialty: Internal Medicine Contact information: [place] [address] [phone]        Thompson Grayer, MD .   Specialty: Cardiology Contact information: [place] [address] [place] [address] [phone]        Satira Sark, MD .   Specialty: Cardiology Contact information: [place] [address] [phone]               The results of significant diagnostics from this hospitalization (including imaging, microbiology, ancillary and laboratory) are listed below for reference.    Significant Diagnostic Studies: DG Chest 2 View  Result Date: 09/19/2019 CLINICAL DATA:  Chest tightness EXAM: CHEST - 2 VIEW COMPARISON:  08/08/2019 FINDINGS: Unchanged mild cardiomegaly. No focal airspace consolidation or pulmonary edema. No pleural effusion or pneumothorax. IMPRESSION: Mild cardiomegaly without focal airspace disease. Electronically Signed   By: Ulyses Jarred M.D.   On: 09/19/2019 22:45   ECHOCARDIOGRAM COMPLETE  Result Date:  09/20/2019    ECHOCARDIOGRAM REPORT   Patient Name:   CHINWE LOPE Date of Exam: 09/20/2019 Medical Rec #:  [id number]          Height:       63.0 in Accession #:    [id number]         Weight:       190.6 lb Date of Birth:  [birth year]          BSA:          1.894 m Patient Age:    39 years           BP:           171/63 mmHg Patient Gender: F                  HR:           48 bpm. Exam Location:  [facility] Procedure: 2D Echo Indications:    Chest Pain 786.50 / R07.9  History:        Patient has prior history of Echocardiogram examinations, most                 recent 08/24/2017. Arrythmias:Atrial Fibrillation and Atrial                 Flutter, Signs/Symptoms:Chest Pain; Risk Factors:Hypertension,                 Dyslipidemia and Non-Smoker. BRADYCARDIA-TACHYCARDIA SYNDROME.  Sonographer:    Leavy Cella RDCS (AE) Referring Phys: [facility]  1. Left ventricular ejection fraction, by estimation, is 60 to 65%. The left ventricle has normal function. The left ventricle has no regional wall motion abnormalities. There is moderate left ventricular hypertrophy. Left ventricular diastolic parameters are consistent with Grade II diastolic dysfunction (pseudonormalization). Elevated left atrial pressure.  2. Right ventricular systolic function is normal. The right ventricular size is normal. There is mildly elevated pulmonary artery systolic pressure.  3. Left atrial size was severely dilated.  4. The mitral valve is normal in structure. Trivial mitral valve regurgitation. No evidence of mitral stenosis.  5. The aortic valve is tricuspid. Aortic valve regurgitation is not visualized. No aortic stenosis is present.  6. Indeterminant PASP, inadequate TR jet.  7. The inferior vena cava is dilated in size with >50% respiratory variability, suggesting right atrial pressure of 8 mmHg.  FINDINGS  Left Ventricle: Left ventricular ejection fraction, by estimation, is 60 to 65%. The left ventricle has normal  function. The left ventricle has no regional wall motion abnormalities. The left ventricular internal cavity size was normal in size. There is  moderate left ventricular hypertrophy. Left ventricular diastolic parameters are consistent with Grade II diastolic dysfunction (pseudonormalization). Elevated left atrial pressure. Right Ventricle: The right ventricular size is normal. No increase in right ventricular wall thickness. Right ventricular systolic function is normal. There is mildly elevated pulmonary artery systolic pressure. The tricuspid regurgitant velocity is 2.81  m/s, and with an assumed right atrial pressure of 10 mmHg, the estimated right ventricular systolic pressure is 02.4 mmHg. Left Atrium: Left atrial size was severely dilated. Right Atrium: Right atrial size was normal in size. Pericardium: There is no evidence of pericardial effusion. Mitral Valve: The mitral valve is normal in structure. There is mild thickening of the mitral valve leaflet(s). There is mild calcification of the mitral valve leaflet(s). Mild mitral annular calcification. Trivial mitral valve regurgitation. No evidence  of mitral valve stenosis. Tricuspid Valve: The tricuspid valve is normal in structure. Tricuspid valve regurgitation is not demonstrated. No evidence of tricuspid stenosis. Aortic Valve: The aortic valve is tricuspid. . There is mild thickening and mild calcification of the aortic valve. Aortic valve regurgitation is not visualized. No aortic stenosis is present. Mild aortic valve annular calcification. There is mild thickening of the aortic valve. There is mild calcification of the aortic valve. Aortic valve mean gradient measures 8.5 mmHg. Aortic valve peak gradient measures 19.6 mmHg. Pulmonic Valve: The pulmonic valve was not well visualized. Pulmonic valve regurgitation is not visualized. No evidence of pulmonic stenosis. Aorta: The aortic root is normal in size and structure. Pulmonary Artery: Indeterminant  PASP, inadequate TR jet. Venous: The inferior vena cava is dilated in size with greater than 50% respiratory variability, suggesting right atrial pressure of 8 mmHg. IAS/Shunts: No atrial level shunt detected by color flow Doppler.  LEFT VENTRICLE PLAX 2D LVIDd:         4.48 cm  Diastology LVIDs:         2.90 cm  LV e' lateral:   6.40 cm/s LV PW:         1.43 cm  LV E/e' lateral: 18.8 LV IVS:        1.47 cm  LV e' medial:    7.18 cm/s LVOT diam:     1.90 cm  LV E/e' medial:  16.7 LVOT Area:     2.84 cm  RIGHT VENTRICLE RV S prime:     16.80 cm/s TAPSE (M-mode): 2.3 cm LEFT ATRIUM             Index       RIGHT ATRIUM           Index LA diam:        4.10 cm 2.16 cm/m  RA Area:     16.20 cm LA Vol (A2C):   91.4 ml 48.25 ml/m RA Volume:   37.40 ml  19.74 ml/m LA Vol (A4C):   80.6 ml 42.54 ml/m LA Biplane Vol: 86.6 ml 45.71 ml/m  AORTIC VALVE AV Vmax:      221.52 cm/s AV Vmean:     134.078 cm/s AV VTI:       0.505 m AV Peak Grad: 19.6 mmHg AV Mean Grad: 8.5 mmHg  AORTA Ao Root diam: 2.50 cm MITRAL VALVE  TRICUSPID VALVE MV Area (PHT): 2.80 cm     TR Peak grad:   31.6 mmHg MV Decel Time: 271 msec     TR Vmax:        281.00 cm/s MV E velocity: 120.00 cm/s MV A velocity: 83.90 cm/s   SHUNTS MV E/A ratio:  1.43         Systemic Diam: 1.90 cm Carlyle Dolly MD Electronically signed by Carlyle Dolly MD Signature Date/Time: 09/20/2019/12:03:54 PM    Final     Microbiology: Recent Results (from the past 240 hour(s))  SARS Coronavirus 2 by RT PCR (hospital order, performed in [facility] hospital lab) Nasopharyngeal Nasopharyngeal Swab     Status: None   Collection Time: 09/20/19  1:00 AM   Specimen: Nasopharyngeal Swab  Result Value Ref Range Status   SARS Coronavirus 2 NEGATIVE NEGATIVE Final    Comment: (NOTE) SARS-CoV-2 target nucleic acids are NOT DETECTED.  The SARS-CoV-2 RNA is generally detectable in upper and lower respiratory specimens during the acute phase of infection. The  lowest concentration of SARS-CoV-2 viral copies this assay can detect is 250 copies / mL. A negative result does not preclude SARS-CoV-2 infection and should not be used as the sole basis for treatment or other patient management decisions.  A negative result may occur with improper specimen collection / handling, submission of specimen other than nasopharyngeal swab, presence of viral mutation(s) within the areas targeted by this assay, and inadequate number of viral copies (<250 copies / mL). A negative result must be combined with clinical observations, patient history, and epidemiological information.  Fact Sheet for Patients:   StrictlyIdeas.no  Fact Sheet for Healthcare Providers: BankingDealers.co.za  This test is not yet approved or  cleared by the [place] FDA and has been authorized for detection and/or diagnosis of SARS-CoV-2 by FDA under an Emergency Use Authorization (EUA).  This EUA will remain in effect (meaning this test can be used) for the duration of the COVID-19 declaration under Section 564(b)(1) of the Act, 21 U.S.C. section 360bbb-3(b)(1), unless the authorization is terminated or revoked sooner.  Performed at [facility], [address] [place], [place] [address]      Labs: Basic Metabolic Panel: Recent Labs  Lab 09/19/19 2219  NA 138  K 4.0  CL 106  CO2 22  GLUCOSE 104*  BUN 19  CREATININE 1.00  CALCIUM 9.1   Liver Function Tests: Recent Labs  Lab 09/19/19 2219  AST 29  ALT 17  ALKPHOS 68  BILITOT 0.5  PROT 7.0  ALBUMIN 3.8   Recent Labs  Lab 09/19/19 2219  LIPASE 34   No results for input(s): AMMONIA in the last 168 hours. CBC: Recent Labs  Lab 09/19/19 2219  WBC 6.9  HGB 11.0*  HCT 36.0  MCV 83.5  PLT 215   Signed:  Barton Dubois MD.  [facility] 09/20/2019, 3:01 PM

## 2019-09-20 NOTE — Progress Notes (Signed)
ANTICOAGULATION CONSULT NOTE - Initial Consult  Pharmacy Consult for warfarin Indication: atrial fibrillation  Allergies  Allergen Reactions  . Bupropion Hcl Other (See Comments)    Suicidal Thoughts.   . Escitalopram Oxalate Other (See Comments)    Suicidal Thoughts.   . Fluticasone-Salmeterol Other (See Comments)    Advair - Caused patient to go into Afib.   Marland Kitchen Lisinopril Cough  . Serevent Other (See Comments)    Caused patient to go into Afib    Patient Measurements: Height: 5\' 3"  (160 cm) Weight: 86.5 kg (190 lb 9.6 oz) IBW/kg (Calculated) : 52.4   Vital Signs: Temp: 97.4 F (36.3 C) (06/23 0800) Temp Source: Oral (06/23 0800) BP: 171/63 (06/23 0800) Pulse Rate: 48 (06/23 0800)  Labs: Recent Labs    09/19/19 2219 09/20/19 0006  HGB 11.0*  --   HCT 36.0  --   PLT 215  --   LABPROT 22.4*  --   INR 2.0*  --   CREATININE 1.00  --   TROPONINIHS 7 6    Estimated Creatinine Clearance: 47.5 mL/min (by C-G formula based on SCr of 1 mg/dL).   Medical History: Past Medical History:  Diagnosis Date  . Allergic rhinitis   . Anxiety   . Asthma   . Atrial flutter ([place])   . COPD (chronic obstructive pulmonary disease) ([place])   . Depression   . Essential hypertension   . Hyperlipidemia   . Lumbar disc disease   . Obesity   . OSA (obstructive sleep apnea)    CPAP  . Paroxysmal atrial fibrillation (HCC)    Element of tachycardia bradycardia syndrome  . Respiratory failure (HCC)     Medications:  Facility-Administered Medications Prior to Admission  Medication Dose Route Frequency Provider Last Rate Last Admin  . Mepolizumab SOLR 100 mg  100 mg Subcutaneous Q28 days Baird Lyons D, MD   100 mg at 11/30/18 0941  . Mepolizumab SOLR 100 mg  100 mg Subcutaneous Q28 days Baird Lyons D, MD   100 mg at 01/25/19 0957  . Mepolizumab SOLR 100 mg  100 mg Subcutaneous Q28 days Baird Lyons D, MD   100 mg at 02/22/19 1040  . Mepolizumab SOLR 100 mg  100 mg  Subcutaneous Q28 days Baird Lyons D, MD   100 mg at 03/22/19 1024  . Mepolizumab SOLR 100 mg  100 mg Subcutaneous Q28 days Baird Lyons D, MD   100 mg at 04/19/19 1045  . Mepolizumab SOLR 100 mg  100 mg Subcutaneous Q28 days Baird Lyons D, MD   100 mg at 06/14/19 1026  . Mepolizumab SOLR 100 mg  100 mg Subcutaneous Q28 days Baird Lyons D, MD   100 mg at 07/13/19 1150  . Mepolizumab SOLR 100 mg  100 mg Subcutaneous Q28 days Baird Lyons D, MD   100 mg at 09/07/19 2458   Medications Prior to Admission  Medication Sig Dispense Refill Last Dose  . acetaminophen (TYLENOL) 500 MG tablet Take 500 mg by mouth daily as needed for headache.     . albuterol (PROAIR HFA) 108 (90 Base) MCG/ACT inhaler 2 puffs every 6 hours for wheezing as needed 1 Inhaler 12   . antiseptic oral rinse (BIOTENE) LIQD 15 mLs by Mouth Rinse route as needed for dry mouth.     Marland Kitchen atorvastatin (LIPITOR) 10 MG tablet Take 10 mg by mouth every evening.      . benzonatate (TESSALON) 200 MG capsule Take 1 capsule (200 mg total)  by mouth 3 (three) times daily as needed for cough. 30 capsule 3   . Biotin 1 MG CAPS Take 1,000 mg by mouth 3 (three) times a week.      . budesonide (PULMICORT) 0.5 MG/2ML nebulizer solution Take 0.5 mg by nebulization daily.      . Calcium Carbonate-Vit D-Min (QC CALCIUM-MAGNESIUM-ZINC-D3) 333.4-133 MG-UNIT TABS Take 1 tablet by mouth 2 (two) times daily.      . cetirizine (ZYRTEC) 10 MG tablet Take 10 mg by mouth daily as needed for allergies.     . clindamycin (CLEOCIN) 300 MG capsule 2 capsules as needed. Take prior to dental procedures  0   . DERMA-SMOOTHE/FS SCALP 0.01 % OIL APPLY A SMALL AMOUNT TO SKIN THREE TIMES A WEEK, PART HAIR APPLY TO SCALP, COVER WITH PLASTIC CAP, [place] OUT IN THE MONING     . Dextromethorphan-guaiFENesin (MUCINEX DM) 30-600 MG TB12 Take 1-2 tablets by mouth as needed.      . diltiazem (CARDIZEM) 30 MG tablet Take 1 tablet (30 mg total) by mouth as needed (for  palpitations). 45 tablet 2   . diltiazem (CARTIA XT) 120 MG 24 hr capsule Take 1 capsule (120 mg total) by mouth at bedtime. 90 capsule 2   . dofetilide (TIKOSYN) 500 MCG capsule Take 1 capsule (500 mcg total) by mouth 2 (two) times daily. 180 capsule 2   . doxycycline (VIBRAMYCIN) 50 MG capsule Take 50 mg by mouth daily.     Marland Kitchen DOXYCYCLINE PO Take by mouth. Taking one tablet by mouth daily     . famotidine (PEPCID) 20 MG tablet Take 2 tablets (40 mg total) by mouth at bedtime. (Patient taking differently: Take 40 mg by mouth as needed. ) 90 tablet 3   . FLUAD 0.5 ML SUSY Inject as directed See admin instructions.     . fluticasone (FLONASE) 50 MCG/ACT nasal spray Place 1 spray into both nostrils 2 (two) times daily.     . hydrALAZINE (APRESOLINE) 50 MG tablet Take 50 mg by mouth 2 (two) times daily.     Marland Kitchen HYDROCODONE-HOMATROPINE PO Taking by mouth prn for cough     . ipratropium-albuterol (DUONEB) 0.5-2.5 (3) MG/3ML SOLN Take 3 mLs by nebulization daily.      Marland Kitchen ketoconazole (NIZORAL) 2 % shampoo APPLY SHAMPOO LEAVE ON SCALP FOR FIVE MINUTES, THEN RINSE OUT     . losartan (COZAAR) 100 MG tablet Take 1 tablet (100 mg total) by mouth daily. 90 tablet 2   . Magnesium 250 MG TABS Take 250 mg by mouth daily.     . metroNIDAZOLE (METROGEL) 1 % gel APPLY TO FACIAL RASH TWICE DAILY     . montelukast (SINGULAIR) 10 MG tablet Take 10 mg by mouth at bedtime.     . pantoprazole (PROTONIX) 40 MG tablet Take 1 tablet (40 mg total) by mouth daily before breakfast. 90 tablet 3   . potassium chloride SA (KLOR-CON) 20 MEQ tablet Take 1 tablet (20 mEq total) by mouth daily. 90 tablet 2   . sodium chloride (OCEAN) 0.65 % SOLN nasal spray Place 1 spray into both nostrils 2 (two) times daily.      Marland Kitchen triamcinolone ointment (KENALOG) 0.1 % Apply 1 application topically daily as needed for rash.  2   . umeclidinium-vilanterol (ANORO ELLIPTA) 62.5-25 MCG/INH AEPB Inhale 1 puff into the lungs daily. 180 each 3   . warfarin  (COUMADIN) 5 MG tablet Take 5 mg by mouth as directed.     Marland Kitchen  Wheat Dextrin (BENEFIBER ON THE GO) POWD Take 1 Dose by mouth daily as needed (constipation). 2 tsp daily in the morning.       Assessment: Pharmacy consulted to dose warfarin in patient with atrial fibrillation. INR on admission is 2.0.  Home dose listed as 7.5 mg on Sun and 5 mg ROW.   Goal of Therapy:  INR 2-3 Monitor platelets by anticoagulation protocol: Yes   Plan:  Warfarin 5 mg x 1 dose. Monitor daily INR and s/s of bleeding.  Ramond Craver 09/20/2019,11:43 AM

## 2019-09-25 ENCOUNTER — Encounter: Payer: Self-pay | Admitting: Cardiology

## 2019-09-25 ENCOUNTER — Ambulatory Visit (INDEPENDENT_AMBULATORY_CARE_PROVIDER_SITE_OTHER): Payer: Medicare Other | Admitting: Cardiology

## 2019-09-25 ENCOUNTER — Other Ambulatory Visit: Payer: Self-pay

## 2019-09-25 ENCOUNTER — Telehealth: Payer: Self-pay | Admitting: Internal Medicine

## 2019-09-25 VITALS — BP 124/58 | HR 62 | Ht 63.0 in | Wt 193.0 lb

## 2019-09-25 DIAGNOSIS — I10 Essential (primary) hypertension: Secondary | ICD-10-CM

## 2019-09-25 DIAGNOSIS — I4819 Other persistent atrial fibrillation: Secondary | ICD-10-CM | POA: Diagnosis not present

## 2019-09-25 DIAGNOSIS — I495 Sick sinus syndrome: Secondary | ICD-10-CM

## 2019-09-25 NOTE — Patient Instructions (Signed)
Medication Instructions:  Your physician recommends that you continue on your current medications as directed. Please refer to the Current Medication list given to you today.  *If you need a refill on your cardiac medications before your next appointment, please call your pharmacy*   Lab Work: None today If you have labs (blood work) drawn today and your tests are completely normal, you will receive your results only by: . MyChart Message (if you have MyChart) OR . A paper copy in the mail If you have any lab test that is abnormal or we need to change your treatment, we will call you to review the results.   Testing/Procedures: None today   Follow-Up: At [facility], you and your health needs are our priority.  As part of our continuing mission to provide you with exceptional heart care, we have created designated Provider Care Teams.  These Care Teams include your primary Cardiologist (physician) and Advanced Practice Providers (APPs -  Physician Assistants and Nurse Practitioners) who all work together to provide you with the care you need, when you need it.  We recommend signing up for the patient portal called "MyChart".  Sign up information is provided on this After Visit Summary.  MyChart is used to connect with patients for Virtual Visits (Telemedicine).  Patients are able to view lab/test results, encounter notes, upcoming appointments, etc.  Non-urgent messages can be sent to your provider as well.   To learn more about what you can do with MyChart, go to [email or web address].    Your next appointment:   3 month(s)  The format for your next appointment:   In Person  Provider:   Samuel McDowell, MD   Other Instructions None      Thank you for choosing [facility] !         

## 2019-09-25 NOTE — Telephone Encounter (Signed)
Nucala Order: °100mg #1 Vial °Order Date: 09/25/2019 °Expected date of arrival: 09/26/2019 °Ordered by: Avantae Bither, CMA  °Specialty Pharmacy: [facility]  °

## 2019-09-25 NOTE — Progress Notes (Signed)
Cardiology Office Note  Date: 09/25/2019   ID: [name] [name], DOB 22-May-1940, MRN [id number]  PCP:  Monico Blitz, MD  Cardiologist:  Rozann Lesches, MD Electrophysiologist:  Thompson Grayer, MD   Chief Complaint  Patient presents with  . Cardiac follow-up    History of Present Illness: [name] [name] is a 79 y.o. female last assessed via telehealth encounter in January and with interval follow-up in the atrial fibrillation clinic as of May.  I reviewed interval records, she was just recently hospitalized with recent palpitations and also chest discomfort.  Chest symptoms occurred after a dental procedure where she thinks she may have swallowed some lidocaine, also had associated belching.  High-sensitivity troponin I levels were not suggestive of ACS, patient was in sinus bradycardia by ECG without acute ST segment changes.  Echocardiogram revealed normal LVEF.  She presents today for a post hospital visit.  States that she is back to baseline.  Thinks that her symptoms may have been GI in etiology.  She is trying to increase her activity through physical therapy, has back and leg weakness as well as orthopedic pain, does have shortness of breath with activity but is trying to build up her stamina.  She continues on Coumadin with CHA2DS2-VASc score of 4.  Also maintaining Tikosyn, Cartia XT, with as needed use of short acting Cardizem.  Past Medical History:  Diagnosis Date  . Allergic rhinitis   . Anxiety   . Asthma   . Atrial flutter ([place])   . COPD (chronic obstructive pulmonary disease) ([place])   . Depression   . Essential hypertension   . Hyperlipidemia   . Lumbar disc disease   . Obesity   . OSA (obstructive sleep apnea)    CPAP  . Paroxysmal atrial fibrillation (HCC)    Element of tachycardia bradycardia syndrome  . Respiratory failure [facility])     Past Surgical History:  Procedure Laterality Date  . ANTERIOR FUSION LUMBAR SPINE    . BIOPSY  08/27/2016    Procedure: BIOPSY;  Surgeon: Rogene Houston, MD;  Location: [facility] ENDO SUITE;  Service: Endoscopy;;  FOUR GASTRIC POLYPS BIOPSIED  . CARDIOVERSION N/A 03/28/2014   Procedure: CARDIOVERSION;  Surgeon: Fay Records, MD;  Location: [facility] ORS;  Service: Cardiovascular;  Laterality: N/A;  . CARDIOVERSION N/A 10/22/2016   Procedure: CARDIOVERSION;  Surgeon: Sueanne Margarita, MD;  Location: [facility] ENDOSCOPY;  Service: Cardiovascular;  Laterality: N/A;  . CATARACT EXTRACTION    . COLONOSCOPY N/A 08/04/2012   Procedure: COLONOSCOPY;  Surgeon: Rogene Houston, MD;  Location: [facility] ENDO SUITE;  Service: Endoscopy;  Laterality: N/A;  730-rescheduled to [place] notified pt  . ELECTROPHYSIOLOGIC STUDY N/A 09/18/2014   Procedure: Atrial Fibrillation Ablation;  Surgeon: Thompson Grayer, MD;  Location: [place] CV LAB;  Service: Cardiovascular;  Laterality: N/A;  . ESOPHAGOGASTRODUODENOSCOPY N/A 08/27/2016   Procedure: ESOPHAGOGASTRODUODENOSCOPY (EGD);  Surgeon: Rogene Houston, MD;  Location: [facility] ENDO SUITE;  Service: Endoscopy;  Laterality: N/A;  1200  . LASIK     Both eyes  . TEE WITHOUT CARDIOVERSION N/A 09/18/2014   Procedure: TRANSESOPHAGEAL ECHOCARDIOGRAM (TEE);  Surgeon: Larey Dresser, MD;  Location: [facility];  Service: Cardiovascular;  Laterality: N/A;  . TEE WITHOUT CARDIOVERSION N/A 10/22/2016   Procedure: TRANSESOPHAGEAL ECHOCARDIOGRAM (TEE);  Surgeon: Sueanne Margarita, MD;  Location: [facility] ENDOSCOPY;  Service: Cardiovascular;  Laterality: N/A;  . TEE WITHOUT CARDIOVERSION N/A 08/24/2017   Procedure: TRANSESOPHAGEAL ECHOCARDIOGRAM (TEE);  Surgeon: Jenkins Rouge  C, MD;  Location: [facility] ENDOSCOPY;  Service: Cardiovascular;  Laterality: N/A;  . TOTAL HIP ARTHROPLASTY  2010   Right    Current Outpatient Medications  Medication Sig Dispense Refill  . acetaminophen (TYLENOL) 500 MG tablet Take 500 mg by mouth daily as needed for headache.    Marland Kitchen antiseptic oral rinse (BIOTENE) LIQD 15 mLs by Mouth Rinse route as needed  for dry mouth.    Marland Kitchen atorvastatin (LIPITOR) 10 MG tablet Take 10 mg by mouth every evening.     . benzonatate (TESSALON) 200 MG capsule Take 1 capsule (200 mg total) by mouth 3 (three) times daily as needed for cough. 30 capsule 3  . Biotin 1 MG CAPS Take 1,000 mg by mouth 3 (three) times a week.     . budesonide (PULMICORT) 0.5 MG/2ML nebulizer solution Take 0.5 mg by nebulization daily.     . Calcium Carbonate-Vit D-Min (QC CALCIUM-MAGNESIUM-ZINC-D3) 333.4-133 MG-UNIT TABS Take 1 tablet by mouth 2 (two) times daily.     . cetirizine (ZYRTEC) 10 MG tablet Take 10 mg by mouth daily as needed for allergies.    . clindamycin (CLEOCIN) 300 MG capsule 2 capsules as needed. Take prior to dental procedures  0  . DERMA-SMOOTHE/FS SCALP 0.01 % OIL APPLY A SMALL AMOUNT TO SKIN THREE TIMES A WEEK, PART HAIR APPLY TO SCALP, COVER WITH PLASTIC CAP, [place] OUT IN THE MONING    . Dextromethorphan-guaiFENesin (MUCINEX DM) 30-600 MG TB12 Take 1-2 tablets by mouth as needed.     . diltiazem (CARDIZEM) 30 MG tablet Take 1 tablet (30 mg total) by mouth as needed (for palpitations). 45 tablet 2  . diltiazem (CARTIA XT) 120 MG 24 hr capsule Take 1 capsule (120 mg total) by mouth at bedtime. 90 capsule 2  . dofetilide (TIKOSYN) 500 MCG capsule Take 1 capsule (500 mcg total) by mouth 2 (two) times daily. 180 capsule 2  . doxycycline (VIBRAMYCIN) 50 MG capsule Take 50 mg by mouth daily as needed (rosacea).     . famotidine (PEPCID) 20 MG tablet Take 2 tablets (40 mg total) by mouth at bedtime as needed. 30 tablet 1  . FLUAD 0.5 ML SUSY Inject as directed See admin instructions.    . fluticasone (FLONASE) 50 MCG/ACT nasal spray Place 1 spray into both nostrils 2 (two) times daily.    . hydrALAZINE (APRESOLINE) 100 MG tablet Take 100 mg by mouth 2 (two) times daily.    Marland Kitchen HYDROCODONE-HOMATROPINE PO Take 5 mLs by mouth daily as needed (cough). Taking by mouth prn for cough     . ipratropium-albuterol (DUONEB) 0.5-2.5 (3) MG/3ML  SOLN Take 3 mLs by nebulization daily.     Marland Kitchen ketoconazole (NIZORAL) 2 % shampoo Apply 1 application topically 3 (three) times a week.     . losartan (COZAAR) 100 MG tablet Take 1 tablet (100 mg total) by mouth daily. 90 tablet 2  . Magnesium 250 MG TABS Take 250 mg by mouth daily.    . metroNIDAZOLE (METROGEL) 1 % gel APPLY TO FACIAL RASH TWICE DAILY    . montelukast (SINGULAIR) 10 MG tablet Take 10 mg by mouth at bedtime.    . pantoprazole (PROTONIX) 40 MG tablet Take 1 tablet (40 mg total) by mouth 2 (two) times daily. 60 tablet 2  . potassium chloride SA (KLOR-CON) 20 MEQ tablet Take 1 tablet (20 mEq total) by mouth daily. 90 tablet 2  . Sodium Chloride-Sodium Bicarb (AYR SALINE NASAL NETI RINSE) 1.57 g  PACK Place 1 each into the nose daily as needed (rinse nasal often).    . triamcinolone ointment (KENALOG) 0.1 % Apply 1 application topically daily as needed for rash.  2  . umeclidinium-vilanterol (ANORO ELLIPTA) 62.5-25 MCG/INH AEPB Inhale 1 puff into the lungs daily. 180 each 3  . warfarin (COUMADIN) 5 MG tablet Take 5 mg by mouth as directed.    . Wheat Dextrin (BENEFIBER ON THE GO) POWD Take 1 Dose by mouth daily as needed (constipation). 2 tsp daily in the morning.     Current Facility-Administered Medications  Medication Dose Route Frequency Provider Last Rate Last Admin  . Mepolizumab SOLR 100 mg  100 mg Subcutaneous Q28 days Baird Lyons D, MD   100 mg at 09/07/19 7096   Allergies:  Bupropion hcl, Escitalopram oxalate, Fluticasone-salmeterol, Lisinopril, and Serevent   ROS:   Intermittent palpitations consistent with her atrial fibrillation.  Physical Exam: VS:  BP (!) 124/58   Pulse 62   Ht 5\' 3"  (1.6 m)   Wt 193 lb (87.5 kg)   SpO2 97%   BMI 34.19 kg/m , BMI Body mass index is 34.19 kg/m.  Wt Readings from Last 3 Encounters:  09/25/19 193 lb (87.5 kg)  09/20/19 190 lb 9.6 oz (86.5 kg)  08/08/19 197 lb 9.6 oz (89.6 kg)    General: Patient appears comfortable at  rest. HEENT: Conjunctiva and lids normal, wearing a mask. Neck: Supple, no elevated JVP or carotid bruits, no thyromegaly. Lungs: Clear to auscultation, nonlabored breathing at rest. Cardiac: Regular rate and rhythm, no S3 or significant systolic murmur, no pericardial rub. Extremities: No pitting edema, distal pulses 2+.  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  Recent Labwork: 08/08/2019: Magnesium 2.0 09/19/2019: ALT 17; AST 29; BUN 19; Creatinine, Ser 1.00; Hemoglobin 11.0; Platelets 215; Potassium 4.0; Sodium 138   Other Studies Reviewed Today:  Echocardiogram 09/20/2019: 1. Left ventricular ejection fraction, by estimation, is 60 to 65%. The  left ventricle has normal function. The left ventricle has no regional  wall motion abnormalities. There is moderate left ventricular hypertrophy.  Left ventricular diastolic  parameters are consistent with Grade II diastolic dysfunction  (pseudonormalization). Elevated left atrial pressure.  2. Right ventricular systolic function is normal. The right ventricular  size is normal. There is mildly elevated pulmonary artery systolic  pressure.  3. Left atrial size was severely dilated.  4. The mitral valve is normal in structure. Trivial mitral valve  regurgitation. No evidence of mitral stenosis.  5. The aortic valve is tricuspid. Aortic valve regurgitation is not  visualized. No aortic stenosis is present.  6. Indeterminant PASP, inadequate TR jet.  7. The inferior vena cava is dilated in size with >50% respiratory  variability, suggesting right atrial pressure of 8 mmHg.   Assessment and Plan:  1.  Persistent atrial fibrillation with CHA2DS2-VASc score of 4.  She has had reasonable rhythm control on Tikosyn, Cartia XT, and short acting Cardizem as needed.  Also remains on Coumadin for stroke prophylaxis.  No changes were  made today.  2.  Recent chest discomfort as discussed above, no evidence of ACS, LVEF stable, potentially GI in etiology per discussion today.  This has not been a recurring symptom.  No further ischemic work-up planned at this time.  3.  Tachycardia-bradycardia syndrome.  She is tolerating AV nodal blockers as outlined above.  No dizziness or  syncope.  Medication Adjustments/Labs and Tests Ordered: Current medicines are reviewed at length with the patient today.  Concerns regarding medicines are outlined above.   Tests Ordered: No orders of the defined types were placed in this encounter.   Medication Changes: No orders of the defined types were placed in this encounter.   Disposition:  Follow up 3 months in the [place] office.  Signed, Satira Sark, MD, [facility] 09/25/2019 1:21 PM    [facility] at [facility] [address] [place], [place] [address] Phone: [phone]; Fax: [phone]

## 2019-09-26 NOTE — Telephone Encounter (Signed)
Nucala Shipment Received: 100mg #1 vial Medication arrival date: 09/26/2019 Lot #: [id number] Exp date: 12/2022 Received by: Ranard Harte, CMA   

## 2019-09-27 DIAGNOSIS — E785 Hyperlipidemia, unspecified: Secondary | ICD-10-CM | POA: Diagnosis not present

## 2019-09-27 DIAGNOSIS — M8580 Other specified disorders of bone density and structure, unspecified site: Secondary | ICD-10-CM | POA: Diagnosis not present

## 2019-09-27 DIAGNOSIS — I10 Essential (primary) hypertension: Secondary | ICD-10-CM | POA: Diagnosis not present

## 2019-10-05 ENCOUNTER — Ambulatory Visit (INDEPENDENT_AMBULATORY_CARE_PROVIDER_SITE_OTHER): Payer: Medicare Other

## 2019-10-05 ENCOUNTER — Other Ambulatory Visit: Payer: Self-pay

## 2019-10-05 DIAGNOSIS — J4520 Mild intermittent asthma, uncomplicated: Secondary | ICD-10-CM

## 2019-10-05 MED ORDER — MEPOLIZUMAB 100 MG ~~LOC~~ SOLR
100.0000 mg | Freq: Once | SUBCUTANEOUS | Status: AC
  Administered 2019-10-05: 100 mg via SUBCUTANEOUS

## 2019-10-05 NOTE — Progress Notes (Signed)
Have you been hospitalized within the last 10 days?  No Do you have a fever?  No Do you have a cough?  No Do you have a headache or sore throat? No Do you have your Epi Pen visible and is it within date?  Yes 

## 2019-10-09 ENCOUNTER — Other Ambulatory Visit (HOSPITAL_COMMUNITY): Payer: Self-pay | Admitting: *Deleted

## 2019-10-09 DIAGNOSIS — R262 Difficulty in walking, not elsewhere classified: Secondary | ICD-10-CM | POA: Diagnosis not present

## 2019-10-09 DIAGNOSIS — M799 Soft tissue disorder, unspecified: Secondary | ICD-10-CM | POA: Diagnosis not present

## 2019-10-09 DIAGNOSIS — M25672 Stiffness of left ankle, not elsewhere classified: Secondary | ICD-10-CM | POA: Diagnosis not present

## 2019-10-09 DIAGNOSIS — M818 Other osteoporosis without current pathological fracture: Secondary | ICD-10-CM | POA: Diagnosis not present

## 2019-10-09 DIAGNOSIS — M25671 Stiffness of right ankle, not elsewhere classified: Secondary | ICD-10-CM | POA: Diagnosis not present

## 2019-10-09 DIAGNOSIS — M25572 Pain in left ankle and joints of left foot: Secondary | ICD-10-CM | POA: Diagnosis not present

## 2019-10-09 DIAGNOSIS — M1990 Unspecified osteoarthritis, unspecified site: Secondary | ICD-10-CM | POA: Diagnosis not present

## 2019-10-09 DIAGNOSIS — E6609 Other obesity due to excess calories: Secondary | ICD-10-CM | POA: Diagnosis not present

## 2019-10-09 DIAGNOSIS — I10 Essential (primary) hypertension: Secondary | ICD-10-CM | POA: Diagnosis not present

## 2019-10-09 DIAGNOSIS — M25571 Pain in right ankle and joints of right foot: Secondary | ICD-10-CM | POA: Diagnosis not present

## 2019-10-09 DIAGNOSIS — M6281 Muscle weakness (generalized): Secondary | ICD-10-CM | POA: Diagnosis not present

## 2019-10-09 MED ORDER — DILTIAZEM HCL ER COATED BEADS 120 MG PO CP24: 120.0000 mg | ORAL_CAPSULE | Freq: Every day | ORAL | 2 refills | Status: DC

## 2019-10-10 DIAGNOSIS — L821 Other seborrheic keratosis: Secondary | ICD-10-CM | POA: Diagnosis not present

## 2019-10-10 DIAGNOSIS — Z299 Encounter for prophylactic measures, unspecified: Secondary | ICD-10-CM | POA: Diagnosis not present

## 2019-10-10 DIAGNOSIS — Z713 Dietary counseling and surveillance: Secondary | ICD-10-CM | POA: Diagnosis not present

## 2019-10-10 DIAGNOSIS — L905 Scar conditions and fibrosis of skin: Secondary | ICD-10-CM | POA: Diagnosis not present

## 2019-10-10 DIAGNOSIS — Z6835 Body mass index (BMI) 35.0-35.9, adult: Secondary | ICD-10-CM | POA: Diagnosis not present

## 2019-10-10 DIAGNOSIS — I4891 Unspecified atrial fibrillation: Secondary | ICD-10-CM | POA: Diagnosis not present

## 2019-10-10 DIAGNOSIS — L718 Other rosacea: Secondary | ICD-10-CM | POA: Diagnosis not present

## 2019-10-10 DIAGNOSIS — L720 Epidermal cyst: Secondary | ICD-10-CM | POA: Diagnosis not present

## 2019-10-10 DIAGNOSIS — I10 Essential (primary) hypertension: Secondary | ICD-10-CM | POA: Diagnosis not present

## 2019-10-10 DIAGNOSIS — L218 Other seborrheic dermatitis: Secondary | ICD-10-CM | POA: Diagnosis not present

## 2019-10-11 DIAGNOSIS — E6609 Other obesity due to excess calories: Secondary | ICD-10-CM | POA: Diagnosis not present

## 2019-10-11 DIAGNOSIS — M25572 Pain in left ankle and joints of left foot: Secondary | ICD-10-CM | POA: Diagnosis not present

## 2019-10-11 DIAGNOSIS — M818 Other osteoporosis without current pathological fracture: Secondary | ICD-10-CM | POA: Diagnosis not present

## 2019-10-11 DIAGNOSIS — M799 Soft tissue disorder, unspecified: Secondary | ICD-10-CM | POA: Diagnosis not present

## 2019-10-11 DIAGNOSIS — M25671 Stiffness of right ankle, not elsewhere classified: Secondary | ICD-10-CM | POA: Diagnosis not present

## 2019-10-11 DIAGNOSIS — M1990 Unspecified osteoarthritis, unspecified site: Secondary | ICD-10-CM | POA: Diagnosis not present

## 2019-10-11 DIAGNOSIS — M25571 Pain in right ankle and joints of right foot: Secondary | ICD-10-CM | POA: Diagnosis not present

## 2019-10-11 DIAGNOSIS — R262 Difficulty in walking, not elsewhere classified: Secondary | ICD-10-CM | POA: Diagnosis not present

## 2019-10-11 DIAGNOSIS — M25672 Stiffness of left ankle, not elsewhere classified: Secondary | ICD-10-CM | POA: Diagnosis not present

## 2019-10-11 DIAGNOSIS — I10 Essential (primary) hypertension: Secondary | ICD-10-CM | POA: Diagnosis not present

## 2019-10-11 DIAGNOSIS — M6281 Muscle weakness (generalized): Secondary | ICD-10-CM | POA: Diagnosis not present

## 2019-10-25 ENCOUNTER — Other Ambulatory Visit (HOSPITAL_COMMUNITY): Payer: Self-pay

## 2019-10-25 MED ORDER — POTASSIUM CHLORIDE CRYS ER 20 MEQ PO TBCR: 20.0000 meq | EXTENDED_RELEASE_TABLET | Freq: Every day | ORAL | 2 refills | Status: DC

## 2019-10-27 DIAGNOSIS — I4891 Unspecified atrial fibrillation: Secondary | ICD-10-CM | POA: Diagnosis not present

## 2019-10-27 DIAGNOSIS — Z720 Tobacco use: Secondary | ICD-10-CM | POA: Diagnosis not present

## 2019-10-27 DIAGNOSIS — I10 Essential (primary) hypertension: Secondary | ICD-10-CM | POA: Diagnosis not present

## 2019-10-27 DIAGNOSIS — J449 Chronic obstructive pulmonary disease, unspecified: Secondary | ICD-10-CM | POA: Diagnosis not present

## 2019-10-30 ENCOUNTER — Telehealth: Payer: Self-pay | Admitting: Internal Medicine

## 2019-10-30 NOTE — Telephone Encounter (Signed)
Spoke with pt. Her appointment has been rescheduled to 1100 on 11/02/2019.

## 2019-11-02 ENCOUNTER — Ambulatory Visit: Payer: Medicare Other

## 2019-11-02 ENCOUNTER — Ambulatory Visit (INDEPENDENT_AMBULATORY_CARE_PROVIDER_SITE_OTHER): Payer: Medicare Other

## 2019-11-02 ENCOUNTER — Other Ambulatory Visit: Payer: Self-pay

## 2019-11-02 DIAGNOSIS — J4520 Mild intermittent asthma, uncomplicated: Secondary | ICD-10-CM | POA: Diagnosis not present

## 2019-11-02 MED ORDER — MEPOLIZUMAB 100 MG ~~LOC~~ SOLR
100.0000 mg | SUBCUTANEOUS | Status: DC
  Administered 2019-11-02: 100 mg via SUBCUTANEOUS

## 2019-11-02 NOTE — Progress Notes (Signed)
All questions were answered by the patient before medication was administered. Have you been hospitalized in the last 10 days? No Do you have a fever? No Do you have a cough? No Do you have a headache or sore throat? No  

## 2019-11-08 ENCOUNTER — Other Ambulatory Visit: Payer: Self-pay

## 2019-11-08 ENCOUNTER — Ambulatory Visit (HOSPITAL_COMMUNITY)
Admission: RE | Admit: 2019-11-08 | Discharge: 2019-11-08 | Disposition: A | Discharge status: Home / Self Care | Payer: Medicare Other | Source: Ambulatory Visit | Attending: Nurse Practitioner | Admitting: Nurse Practitioner

## 2019-11-08 ENCOUNTER — Encounter (HOSPITAL_COMMUNITY): Payer: Self-pay | Admitting: Nurse Practitioner

## 2019-11-08 VITALS — BP 120/58 | HR 58 | Ht 63.0 in | Wt 194.2 lb

## 2019-11-08 DIAGNOSIS — D6869 Other thrombophilia: Secondary | ICD-10-CM | POA: Diagnosis not present

## 2019-11-08 DIAGNOSIS — Z6834 Body mass index (BMI) 34.0-34.9, adult: Secondary | ICD-10-CM | POA: Diagnosis not present

## 2019-11-08 DIAGNOSIS — Z79899 Other long term (current) drug therapy: Secondary | ICD-10-CM | POA: Insufficient documentation

## 2019-11-08 DIAGNOSIS — E785 Hyperlipidemia, unspecified: Secondary | ICD-10-CM | POA: Insufficient documentation

## 2019-11-08 DIAGNOSIS — Z888 Allergy status to other drugs, medicaments and biological substances status: Secondary | ICD-10-CM | POA: Insufficient documentation

## 2019-11-08 DIAGNOSIS — E669 Obesity, unspecified: Secondary | ICD-10-CM | POA: Diagnosis not present

## 2019-11-08 DIAGNOSIS — Z9989 Dependence on other enabling machines and devices: Secondary | ICD-10-CM | POA: Diagnosis not present

## 2019-11-08 DIAGNOSIS — G4733 Obstructive sleep apnea (adult) (pediatric): Secondary | ICD-10-CM | POA: Insufficient documentation

## 2019-11-08 DIAGNOSIS — I4819 Other persistent atrial fibrillation: Secondary | ICD-10-CM | POA: Insufficient documentation

## 2019-11-08 DIAGNOSIS — J449 Chronic obstructive pulmonary disease, unspecified: Secondary | ICD-10-CM | POA: Insufficient documentation

## 2019-11-08 DIAGNOSIS — Z7901 Long term (current) use of anticoagulants: Secondary | ICD-10-CM | POA: Diagnosis not present

## 2019-11-08 DIAGNOSIS — I10 Essential (primary) hypertension: Secondary | ICD-10-CM | POA: Insufficient documentation

## 2019-11-08 LAB — BASIC METABOLIC PANEL
Anion gap: 11 (ref 5–15)
BUN: 17 mg/dL (ref 8–23)
CO2: 21 mmol/L — ABNORMAL LOW (ref 22–32)
Calcium: 9.2 mg/dL (ref 8.9–10.3)
Chloride: 107 mmol/L (ref 98–111)
Creatinine, Ser: 1.08 mg/dL — ABNORMAL HIGH (ref 0.44–1.00)
GFR calc Af Amer: 56 mL/min — ABNORMAL LOW (ref >60)
GFR calc non Af Amer: 48 mL/min — ABNORMAL LOW (ref >60)
Glucose, Bld: 115 mg/dL — ABNORMAL HIGH (ref 70–99)
Potassium: 4 mmol/L (ref 3.5–5.1)
Sodium: 139 mmol/L (ref 135–145)

## 2019-11-08 LAB — MAGNESIUM: Magnesium: 2 mg/dL (ref 1.7–2.4)

## 2019-11-08 NOTE — Progress Notes (Signed)
Primary Care Physician: Monico Blitz, MD Referring Physician: Dr. [name] is a 79 y.o. female with a h/o COPD, HTN, OSA, atrial flutter and paroxysmal afib tht has been persistent for several weeks. She was seen  the afib clinic for admission for Tikosyn. She failed flecainide, many cardioversion's in the past.  She is no longer on HCTZ.  F/u in afib clinic one week after Tikosyn loading. She is Sinus brady today at HR of 49 bpm, but at home HR ins in the 50's  Which is her norm. She feels improved on tikosyn being  in rhythm.  F/u in afib clinic 8/21. She had a few off days lat week and wondered if she was in afib. The only change in her health is that she started Trilogy inhaler for her asthma and had to be treated for thrush, She has now stopped inhaler. She is in rhythm today. She has started Pulmonology rehab and is pleased that she is having more energy and is able to do activities that she was able to do before.  F/u in afib clinic 7/27 for Tikosyn surveillance. Her Ekg shows SR with qtc per EKG read out at 532 ms. I asked Dr. Rayann Heman to look at EKG and he feels qtc is closer to  500 ms and does not recommend any change int Tikosyn dose. She has had some recent afib, one night as her power went out and she got hot. She currently has has a UTI and is on Macrodantin . This as well may have stirred up some afib recently.  F/u afib clinic, 11/19, pt is in for tikosyn surveillance. Feels some afib, short term, but not like she had before Tikosyn, which would usually require cardioversion.  Feels he has been doing very well with  her lungs being more stable as well. HR 49 bpm in the office in SR but had home HR's in the 50's/60's.   F/u in afib clinic 08/08/19 for tikosyn surveillance. She  has very few breakthrough afib episodes. Using cardizem 30 mg as needed, drug will usually get her back in rhythm within 3-4 hours. Her qtc is stable today. Had recent cbc/bmet with her PCP,  mag to be drawn today.continue dofetilide 500 mg bid and warfarin for a CHA2DS2VASc score of 4. HEr heart rate is usually slow in the 50's, asymptomatic. At home readings reviewed and pulse ranges 50-70's. PCP is having her send in remote BP readings daily.   F/u in afib clinic, 11/08/19, for tikosyn surveillance . She is doing well staying in [place] on [place]. Qtc is stable. Has had her covid shots. Very few afib episodes.  Today, she denies symptoms of palpitations, chest pain, shortness of breath, orthopnea, PND, lower extremity edema, dizziness, presyncope, syncope, or neurologic sequela. The patient is tolerating medications without difficulties and is otherwise without complaint today.   Past Medical History:  Diagnosis Date  . Allergic rhinitis   . Anxiety   . Asthma   . Atrial flutter ([place])   . COPD (chronic obstructive pulmonary disease) ([place])   . Depression   . Essential hypertension   . Hyperlipidemia   . Lumbar disc disease   . Obesity   . OSA (obstructive sleep apnea)    CPAP  . Paroxysmal atrial fibrillation (HCC)    Element of tachycardia bradycardia syndrome  . Respiratory failure [facility])    Past Surgical History:  Procedure Laterality Date  . ANTERIOR FUSION LUMBAR SPINE    .  BIOPSY  08/27/2016   Procedure: BIOPSY;  Surgeon: Rogene Houston, MD;  [facility];  Service: Endoscopy;;  FOUR GASTRIC POLYPS BIOPSIED  . CARDIOVERSION N/A 03/28/2014   Procedure: CARDIOVERSION;  Surgeon: Fay Records, MD;  Location: [facility] ORS;  Service: Cardiovascular;  Laterality: N/A;  . CARDIOVERSION N/A 10/22/2016   Procedure: CARDIOVERSION;  Surgeon: Sueanne Margarita, MD;  Location: [facility] ENDOSCOPY;  Service: Cardiovascular;  Laterality: N/A;  . CATARACT EXTRACTION    . COLONOSCOPY N/A 08/04/2012   Procedure: COLONOSCOPY;  Surgeon: Rogene Houston, MD;  [facility];  Service: Endoscopy;  Laterality: N/A;  730-rescheduled to [place] notified pt  . ELECTROPHYSIOLOGIC STUDY N/A  09/18/2014   Procedure: Atrial Fibrillation Ablation;  Surgeon: Thompson Grayer, MD;  Location: [place] CV LAB;  Service: Cardiovascular;  Laterality: N/A;  . ESOPHAGOGASTRODUODENOSCOPY N/A 08/27/2016   Procedure: ESOPHAGOGASTRODUODENOSCOPY (EGD);  Surgeon: Rogene Houston, MD;  [facility];  Service: Endoscopy;  Laterality: N/A;  1200  . LASIK     Both eyes  . TEE WITHOUT CARDIOVERSION N/A 09/18/2014   Procedure: TRANSESOPHAGEAL ECHOCARDIOGRAM (TEE);  Surgeon: Larey Dresser, MD;  Location: [place];  Service: Cardiovascular;  Laterality: N/A;  . TEE WITHOUT CARDIOVERSION N/A 10/22/2016   Procedure: TRANSESOPHAGEAL ECHOCARDIOGRAM (TEE);  Surgeon: Sueanne Margarita, MD;  Location: [facility] ENDOSCOPY;  Service: Cardiovascular;  Laterality: N/A;  . TEE WITHOUT CARDIOVERSION N/A 08/24/2017   Procedure: TRANSESOPHAGEAL ECHOCARDIOGRAM (TEE);  Surgeon: Josue Hector, MD;  Location: [facility] ENDOSCOPY;  Service: Cardiovascular;  Laterality: N/A;  . TOTAL HIP ARTHROPLASTY  2010   Right    Current Outpatient Medications  Medication Sig Dispense Refill  . acetaminophen (TYLENOL) 500 MG tablet Take 500 mg by mouth daily as needed for headache.    Marland Kitchen antiseptic oral rinse (BIOTENE) LIQD 15 mLs by Mouth Rinse route as needed for dry mouth.    Marland Kitchen atorvastatin (LIPITOR) 10 MG tablet Take 10 mg by mouth every evening.     . benzonatate (TESSALON) 200 MG capsule Take 1 capsule (200 mg total) by mouth 3 (three) times daily as needed for cough. 30 capsule 3  . Biotin 1 MG CAPS Take 1,000 mg by mouth 3 (three) times a week.     . budesonide (PULMICORT) 0.5 MG/2ML nebulizer solution Take 0.5 mg by nebulization daily.     . Calcium Carbonate-Vit D-Min (QC CALCIUM-MAGNESIUM-ZINC-D3) 333.4-133 MG-UNIT TABS Take 1 tablet by mouth 2 (two) times daily.     . cetirizine (ZYRTEC) 10 MG tablet Take 10 mg by mouth daily as needed for allergies.    . clindamycin (CLEOCIN) 300 MG capsule 2 capsules as needed. Take prior to  dental procedures  0  . DERMA-SMOOTHE/FS SCALP 0.01 % OIL APPLY A SMALL AMOUNT TO SKIN THREE TIMES A WEEK, PART HAIR APPLY TO SCALP, COVER WITH PLASTIC CAP, [place] OUT IN THE MONING    . Dextromethorphan-guaiFENesin (MUCINEX DM) 30-600 MG TB12 Take 1-2 tablets by mouth as needed.     . diltiazem (CARDIZEM) 30 MG tablet Take 1 tablet (30 mg total) by mouth as needed (for palpitations). 45 tablet 2  . diltiazem (CARTIA XT) 120 MG 24 hr capsule Take 1 capsule (120 mg total) by mouth at bedtime. 90 capsule 2  . dofetilide (TIKOSYN) 500 MCG capsule Take 1 capsule (500 mcg total) by mouth 2 (two) times daily. 180 capsule 2  . doxycycline (VIBRAMYCIN) 50 MG capsule Take 50 mg by mouth daily  as needed (rosacea).     . famotidine (PEPCID) 20 MG tablet Take 2 tablets (40 mg total) by mouth at bedtime as needed. 30 tablet 1  . FLUAD 0.5 ML SUSY Inject as directed See admin instructions.    . fluticasone (FLONASE) 50 MCG/ACT nasal spray Place 1 spray into both nostrils 2 (two) times daily.    . hydrALAZINE (APRESOLINE) 100 MG tablet Take 100 mg by mouth 2 (two) times daily.    Marland Kitchen HYDROCODONE-HOMATROPINE PO Take 5 mLs by mouth daily as needed (cough). Taking by mouth prn for cough     . ipratropium-albuterol (DUONEB) 0.5-2.5 (3) MG/3ML SOLN Take 3 mLs by nebulization daily.     Marland Kitchen ketoconazole (NIZORAL) 2 % shampoo Apply 1 application topically 3 (three) times a week.     . losartan (COZAAR) 100 MG tablet Take 1 tablet (100 mg total) by mouth daily. 90 tablet 2  . Magnesium 250 MG TABS Take 250 mg by mouth daily.    . metroNIDAZOLE (METROGEL) 1 % gel APPLY TO FACIAL RASH TWICE DAILY    . montelukast (SINGULAIR) 10 MG tablet Take 10 mg by mouth at bedtime.    . pantoprazole (PROTONIX) 40 MG tablet Take 1 tablet (40 mg total) by mouth 2 (two) times daily. 60 tablet 2  . potassium chloride SA (KLOR-CON) 20 MEQ tablet Take 1 tablet (20 mEq total) by mouth daily. 90 tablet 2  . Sodium Chloride-Sodium Bicarb (AYR  SALINE NASAL NETI RINSE) 1.57 g PACK Place 1 each into the nose daily as needed (rinse nasal often).    . triamcinolone ointment (KENALOG) 0.1 % Apply 1 application topically daily as needed for rash.  2  . umeclidinium-vilanterol (ANORO ELLIPTA) 62.5-25 MCG/INH AEPB Inhale 1 puff into the lungs daily. 180 each 3  . warfarin (COUMADIN) 5 MG tablet Take 5 mg by mouth as directed.    . Wheat Dextrin (BENEFIBER ON THE GO) POWD Take 1 Dose by mouth daily as needed (constipation). 2 tsp daily in the morning.     Current Facility-Administered Medications  Medication Dose Route Frequency Provider Last Rate Last Admin  . Mepolizumab SOLR 100 mg  100 mg Subcutaneous Q28 days Baird Lyons D, MD   100 mg at 09/07/19 0947  . Mepolizumab SOLR 100 mg  100 mg Subcutaneous Q28 days Baird Lyons D, MD   100 mg at 11/02/19 1123    Allergies  Allergen Reactions  . Bupropion Hcl Other (See Comments)    Suicidal Thoughts.   . Escitalopram Oxalate Other (See Comments)    Suicidal Thoughts.   . Fluticasone-Salmeterol Other (See Comments)    Advair - Caused patient to go into Afib.   Marland Kitchen Lisinopril Cough  . Serevent Other (See Comments)    Caused patient to go into Afib    Social History   Socioeconomic History  . Marital status: Single    Spouse name: Not on file  . Number of children: Not on file  . Years of education: Not on file  . Highest education level: Not on file  Occupational History  . Occupation: Retired: Presenter, broadcasting  Tobacco Use  . Smoking status: Never Smoker  . Smokeless tobacco: Never Used  Vaping Use  . Vaping Use: Never used  Substance and Sexual Activity  . Alcohol use: No    Alcohol/week: 0.0 standard drinks  . Drug use: No  . Sexual activity: Not on file  Other Topics Concern  . Not on file  Social History Narrative   Pt lives in [place] alone. She was never married.   Retired Customer service manager.   Attends [employer]   Social Determinants of Health    Financial Resource Strain:   . Difficulty of Paying Living Expenses:   Food Insecurity:   . Worried About Charity fundraiser in the Last Year:   . Arboriculturist in the Last Year:   Transportation Needs:   . Film/video editor (Medical):   Marland Kitchen Lack of Transportation (Non-Medical):   Physical Activity:   . Days of Exercise per Week:   . Minutes of Exercise per Session:   Stress:   . Feeling of Stress :   Social Connections:   . Frequency of Communication with Friends and Family:   . Frequency of Social Gatherings with Friends and Family:   . Attends Religious Services:   . Active Member of Clubs or Organizations:   . Attends Archivist Meetings:   Marland Kitchen Marital Status:   Intimate Partner Violence:   . Fear of Current or Ex-Partner:   . Emotionally Abused:   Marland Kitchen Physically Abused:   . Sexually Abused:     Family History  Problem Relation Age of Onset  . Cancer Mother   . Heart attack Father   . Colon cancer Other     ROS- All systems are reviewed and negative except as per the HPI above  Physical Exam: Vitals:   11/08/19 1036  BP: (!) 120/58  Pulse: (!) 58  SpO2: 98%  Weight: 88.1 kg  Height: 5\' 3"  (1.6 m)   Wt Readings from Last 3 Encounters:  11/08/19 88.1 kg  09/25/19 87.5 kg  09/20/19 86.5 kg    Labs: Lab Results  Component Value Date   NA 139 11/08/2019   K 4.0 11/08/2019   CL 107 11/08/2019   CO2 21 (L) 11/08/2019   GLUCOSE 115 (H) 11/08/2019   BUN 17 11/08/2019   CREATININE 1.08 (H) 11/08/2019   CALCIUM 9.2 11/08/2019   MG 2.0 11/08/2019   Lab Results  Component Value Date   INR 2.0 (H) 09/19/2019   No results found for: CHOL, HDL, LDLCALC, TRIG   GEN- The patient is well appearing, alert and oriented x 3 today.   Head- normocephalic, atraumatic Eyes-  Sclera clear, conjunctiva pink Ears- hearing intact Oropharynx- clear Neck- supple, no JVP Lymph- no cervical lymphadenopathy Lungs- Clear to ausculation bilaterally, normal  work of breathing Heart-slow,  regular rate and rhythm, no murmurs, rubs or gallops, PMI not laterally displaced GI- soft, NT, ND, + BS Extremities- no clubbing, cyanosis, or edema MS- no significant deformity or atrophy Skin- no rash or lesion Psych- euthymic mood, full affect Neuro- strength and sensation are intact  EKG-  Sinus brady at 53 bpm,  qtc 410 ms (stable)      Assessment and Plan: 1. Persistent  afib Has failed flecainide Doing well  with Tikosyn  with rare breakthrough  Continue Tikosyn at 500 mcg bid ( qt  stable) Continue warfarin for chadsvasc score of 4 Continue Cartia XT 120 mg daily amd 30 mg as needed Mag today, bmet today    2. HTN Stable here   3. Asthma Stable per pt on current inhalers/drugs   F/u here in  6 months     Butch Penny C. Hanish Laraia, [facility] [address] [place], [place] [address] [phone]

## 2019-11-09 DIAGNOSIS — I10 Essential (primary) hypertension: Secondary | ICD-10-CM | POA: Diagnosis not present

## 2019-11-09 DIAGNOSIS — I4891 Unspecified atrial fibrillation: Secondary | ICD-10-CM | POA: Diagnosis not present

## 2019-11-09 DIAGNOSIS — G4733 Obstructive sleep apnea (adult) (pediatric): Secondary | ICD-10-CM | POA: Diagnosis not present

## 2019-11-09 DIAGNOSIS — Z299 Encounter for prophylactic measures, unspecified: Secondary | ICD-10-CM | POA: Diagnosis not present

## 2019-11-09 DIAGNOSIS — Z6835 Body mass index (BMI) 35.0-35.9, adult: Secondary | ICD-10-CM | POA: Diagnosis not present

## 2019-11-10 ENCOUNTER — Other Ambulatory Visit: Payer: Self-pay

## 2019-11-10 ENCOUNTER — Encounter: Payer: Self-pay | Admitting: Podiatry

## 2019-11-10 ENCOUNTER — Ambulatory Visit (INDEPENDENT_AMBULATORY_CARE_PROVIDER_SITE_OTHER): Payer: Medicare Other | Admitting: Podiatry

## 2019-11-10 DIAGNOSIS — M79675 Pain in left toe(s): Secondary | ICD-10-CM | POA: Diagnosis not present

## 2019-11-10 DIAGNOSIS — M2141 Flat foot [pes planus] (acquired), right foot: Secondary | ICD-10-CM

## 2019-11-10 DIAGNOSIS — M2042 Other hammer toe(s) (acquired), left foot: Secondary | ICD-10-CM

## 2019-11-10 DIAGNOSIS — B351 Tinea unguium: Secondary | ICD-10-CM | POA: Diagnosis not present

## 2019-11-10 DIAGNOSIS — M2041 Other hammer toe(s) (acquired), right foot: Secondary | ICD-10-CM

## 2019-11-10 DIAGNOSIS — M2142 Flat foot [pes planus] (acquired), left foot: Secondary | ICD-10-CM

## 2019-11-10 DIAGNOSIS — M2021 Hallux rigidus, right foot: Secondary | ICD-10-CM

## 2019-11-10 DIAGNOSIS — M79674 Pain in right toe(s): Secondary | ICD-10-CM | POA: Diagnosis not present

## 2019-11-11 NOTE — Progress Notes (Signed)
Subjective:  Patient ID: [name] [name], female    DOB: 1941-02-13,  MRN: [id number]  [name] [name] presents to clinic today for painful thick toenails that are difficult to trim. Pain interferes with ambulation. Aggravating factors include wearing enclosed shoe gear. Pain is relieved with periodic professional debridement..  79 y.o. female presents with the above complaint.    Review of Systems: Negative except as noted in the HPI. Past Medical History:  Diagnosis Date  . Allergic rhinitis   . Anxiety   . Asthma   . Atrial flutter ([place])   . COPD (chronic obstructive pulmonary disease) ([place])   . Depression   . Essential hypertension   . Hyperlipidemia   . Lumbar disc disease   . Obesity   . OSA (obstructive sleep apnea)    CPAP  . Paroxysmal atrial fibrillation (HCC)    Element of tachycardia bradycardia syndrome  . Respiratory failure [facility])    Past Surgical History:  Procedure Laterality Date  . ANTERIOR FUSION LUMBAR SPINE    . BIOPSY  08/27/2016   Procedure: BIOPSY;  Surgeon: Rogene Houston, MD;  [facility];  Service: Endoscopy;;  FOUR GASTRIC POLYPS BIOPSIED  . CARDIOVERSION N/A 03/28/2014   Procedure: CARDIOVERSION;  Surgeon: Fay Records, MD;  Location: [facility] ORS;  Service: Cardiovascular;  Laterality: N/A;  . CARDIOVERSION N/A 10/22/2016   Procedure: CARDIOVERSION;  Surgeon: Sueanne Margarita, MD;  Location: [facility] ENDOSCOPY;  Service: Cardiovascular;  Laterality: N/A;  . CATARACT EXTRACTION    . COLONOSCOPY N/A 08/04/2012   Procedure: COLONOSCOPY;  Surgeon: Rogene Houston, MD;  [facility];  Service: Endoscopy;  Laterality: N/A;  730-rescheduled to [place] notified pt  . ELECTROPHYSIOLOGIC STUDY N/A 09/18/2014   Procedure: Atrial Fibrillation Ablation;  Surgeon: Thompson Grayer, MD;  Location: [place] CV LAB;  Service: Cardiovascular;  Laterality: N/A;  . ESOPHAGOGASTRODUODENOSCOPY N/A 08/27/2016   Procedure: ESOPHAGOGASTRODUODENOSCOPY (EGD);   Surgeon: Rogene Houston, MD;  [facility];  Service: Endoscopy;  Laterality: N/A;  1200  . LASIK     Both eyes  . TEE WITHOUT CARDIOVERSION N/A 09/18/2014   Procedure: TRANSESOPHAGEAL ECHOCARDIOGRAM (TEE);  Surgeon: Larey Dresser, MD;  Location: [place];  Service: Cardiovascular;  Laterality: N/A;  . TEE WITHOUT CARDIOVERSION N/A 10/22/2016   Procedure: TRANSESOPHAGEAL ECHOCARDIOGRAM (TEE);  Surgeon: Sueanne Margarita, MD;  Location: [facility] ENDOSCOPY;  Service: Cardiovascular;  Laterality: N/A;  . TEE WITHOUT CARDIOVERSION N/A 08/24/2017   Procedure: TRANSESOPHAGEAL ECHOCARDIOGRAM (TEE);  Surgeon: Josue Hector, MD;  Location: [facility] ENDOSCOPY;  Service: Cardiovascular;  Laterality: N/A;  . TOTAL HIP ARTHROPLASTY  2010   Right    Current Outpatient Medications:  .  acetaminophen (TYLENOL) 500 MG tablet, Take 500 mg by mouth daily as needed for headache., Disp: , Rfl:  .  antiseptic oral rinse (BIOTENE) LIQD, 15 mLs by Mouth Rinse route as needed for dry mouth., Disp: , Rfl:  .  atorvastatin (LIPITOR) 10 MG tablet, Take 10 mg by mouth every evening. , Disp: , Rfl:  .  benzonatate (TESSALON) 200 MG capsule, Take 1 capsule (200 mg total) by mouth 3 (three) times daily as needed for cough., Disp: 30 capsule, Rfl: 3 .  Biotin 1 MG CAPS, Take 1,000 mg by mouth 3 (three) times a week. , Disp: , Rfl:  .  budesonide (PULMICORT) 0.5 MG/2ML nebulizer solution, Take 0.5 mg by nebulization daily. , Disp: , Rfl:  .  Calcium Carbonate-Vit  D-Min (QC CALCIUM-MAGNESIUM-ZINC-D3) 333.4-133 MG-UNIT TABS, Take 1 tablet by mouth 2 (two) times daily. , Disp: , Rfl:  .  cetirizine (ZYRTEC) 10 MG tablet, Take 10 mg by mouth daily as needed for allergies., Disp: , Rfl:  .  clindamycin (CLEOCIN) 300 MG capsule, 2 capsules as needed. Take prior to dental procedures, Disp: , Rfl: 0 .  clobetasol ointment (TEMOVATE) 0.05 %, SMARTSIG:Sparingly Topical PRN, Disp: , Rfl:  .  DERMA-SMOOTHE/FS SCALP 0.01 % OIL, APPLY  A SMALL AMOUNT TO SKIN THREE TIMES A WEEK, PART HAIR APPLY TO SCALP, COVER WITH PLASTIC CAP, [place] OUT IN THE MONING, Disp: , Rfl:  .  Dextromethorphan-guaiFENesin (MUCINEX DM) 30-600 MG TB12, Take 1-2 tablets by mouth as needed. , Disp: , Rfl:  .  diltiazem (CARDIZEM) 30 MG tablet, Take 1 tablet (30 mg total) by mouth as needed (for palpitations)., Disp: 45 tablet, Rfl: 2 .  diltiazem (CARTIA XT) 120 MG 24 hr capsule, Take 1 capsule (120 mg total) by mouth at bedtime., Disp: 90 capsule, Rfl: 2 .  dofetilide (TIKOSYN) 500 MCG capsule, Take 1 capsule (500 mcg total) by mouth 2 (two) times daily., Disp: 180 capsule, Rfl: 2 .  doxycycline (ADOXA) 50 MG tablet, Take 50 mg by mouth daily., Disp: , Rfl:  .  doxycycline (VIBRAMYCIN) 50 MG capsule, Take 50 mg by mouth daily as needed (rosacea). , Disp: , Rfl:  .  famotidine (PEPCID) 20 MG tablet, Take 2 tablets (40 mg total) by mouth at bedtime as needed., Disp: 30 tablet, Rfl: 1 .  FLUAD 0.5 ML SUSY, Inject as directed See admin instructions., Disp: , Rfl:  .  fluticasone (FLONASE) 50 MCG/ACT nasal spray, Place 1 spray into both nostrils 2 (two) times daily., Disp: , Rfl:  .  hydrALAZINE (APRESOLINE) 100 MG tablet, Take 100 mg by mouth 2 (two) times daily., Disp: , Rfl:  .  HYDROCODONE-HOMATROPINE PO, Take 5 mLs by mouth daily as needed (cough). Taking by mouth prn for cough , Disp: , Rfl:  .  ipratropium-albuterol (DUONEB) 0.5-2.5 (3) MG/3ML SOLN, Take 3 mLs by nebulization daily. , Disp: , Rfl:  .  ketoconazole (NIZORAL) 2 % cream, SMARTSIG:Sparingly Topical Twice Daily, Disp: , Rfl:  .  ketoconazole (NIZORAL) 2 % shampoo, Apply 1 application topically 3 (three) times a week. , Disp: , Rfl:  .  losartan (COZAAR) 100 MG tablet, Take 1 tablet (100 mg total) by mouth daily., Disp: 90 tablet, Rfl: 2 .  Magnesium 250 MG TABS, Take 250 mg by mouth daily., Disp: , Rfl:  .  metroNIDAZOLE (METROGEL) 1 % gel, APPLY TO FACIAL RASH TWICE DAILY, Disp: , Rfl:  .   montelukast (SINGULAIR) 10 MG tablet, Take 10 mg by mouth at bedtime., Disp: , Rfl:  .  pantoprazole (PROTONIX) 40 MG tablet, Take 1 tablet (40 mg total) by mouth 2 (two) times daily., Disp: 60 tablet, Rfl: 2 .  potassium chloride SA (KLOR-CON) 20 MEQ tablet, Take 1 tablet (20 mEq total) by mouth daily., Disp: 90 tablet, Rfl: 2 .  Sodium Chloride-Sodium Bicarb (AYR SALINE NASAL NETI RINSE) 1.57 g PACK, Place 1 each into the nose daily as needed (rinse nasal often)., Disp: , Rfl:  .  triamcinolone ointment (KENALOG) 0.1 %, Apply 1 application topically daily as needed for rash., Disp: , Rfl: 2 .  umeclidinium-vilanterol (ANORO ELLIPTA) 62.5-25 MCG/INH AEPB, Inhale 1 puff into the lungs daily., Disp: 180 each, Rfl: 3 .  warfarin (COUMADIN) 5 MG tablet, Take  5 mg by mouth as directed., Disp: , Rfl:  .  Wheat Dextrin (BENEFIBER ON THE GO) POWD, Take 1 Dose by mouth daily as needed (constipation). 2 tsp daily in the morning., Disp: , Rfl:   Current Facility-Administered Medications:  Marland Kitchen  Mepolizumab SOLR 100 mg, 100 mg, Subcutaneous, Q28 days, Young, Clinton D, MD, 100 mg at 09/07/19 0947 .  Mepolizumab SOLR 100 mg, 100 mg, Subcutaneous, Q28 days, Young, Clinton D, MD, 100 mg at 11/02/19 1123 Allergies  Allergen Reactions  . Bupropion Hcl Other (See Comments)    Suicidal Thoughts.   . Escitalopram Oxalate Other (See Comments)    Suicidal Thoughts.   . Fluticasone-Salmeterol Other (See Comments)    Advair - Caused patient to go into Afib.   Marland Kitchen Lisinopril Cough  . Serevent Other (See Comments)    Caused patient to go into Afib   Social History   Occupational History  . Occupation: Retired: Presenter, broadcasting  Tobacco Use  . Smoking status: Never Smoker  . Smokeless tobacco: Never Used  Vaping Use  . Vaping Use: Never used  Substance and Sexual Activity  . Alcohol use: No    Alcohol/week: 0.0 standard drinks  . Drug use: No  . Sexual activity: Not on file    Objective:   Constitutional  [name] [name] is a pleasant 79 y.o. Caucasian female, in NAD.Marland Kitchen AAO x 3.   Vascular Capillary refill time to digits immediate b/l. Palpable pedal pulses b/l LE. Pedal hair sparse. Lower extremity skin temperature gradient within normal limits. No pain with calf compression b/l. No edema noted b/l lower extremities.  No cyanosis or clubbing noted.  Neurologic Normal speech. Oriented to person, place, and time. Epicritic sensation to light touch grossly present bilaterally. Protective sensation intact 5/5 intact bilaterally with 10g monofilament b/l. Proprioception intact bilaterally.  Dermatologic Pedal skin with normal turgor, texture and tone bilaterally. No open wounds bilaterally. No interdigital macerations bilaterally. Toenails 1-5 right, L hallux, L 4th toe and L 5th toe elongated, discolored, dystrophic, thickened, and crumbly with subungual debris and tenderness to dorsal palpation. Nondystrophic toenails L 2nd toe and L 3rd toe.  Orthopedic: Normal muscle strength 5/5 to all lower extremity muscle groups bilaterally. No pain crepitus or joint limitation noted with ROM b/l. Hallux valgus with bunion deformity noted b/l lower extremities. Hammertoes noted to the b/l lower extremities. Pes planus deformity noted b/l.    Radiographs: None Assessment:   1. Pain due to onychomycosis of toenails of both feet   2. Hammertoes of both feet   3. Hallux rigidus of right foot   4. Pes planus of both feet    Plan:  Patient was evaluated and treated and all questions answered.  Onychomycosis with pain -Nails palliatively debridement as below -Educated on self-care  Procedure: Nail Debridement Rationale: Pain Type of Debridement: manual, sharp debridement. Instrumentation: Nail nipper, rotary burr. Number of Nails: 8 -Examined patient. -Toenails 1-5 right, L hallux, L 4th toe and L 5th toe debrided in length and girth without iatrogenic bleeding with sterile nail nipper and dremel.    -Patient to report any pedal injuries to medical professional immediately. -Patient to continue soft, supportive shoe gear daily. -Patient/POA to call should there be question/concern in the interim.  Return in about 3 months (around 02/10/2020) for nail trim.  Marzetta Board, DPM

## 2019-11-22 ENCOUNTER — Telehealth: Payer: Self-pay | Admitting: Internal Medicine

## 2019-11-22 NOTE — Telephone Encounter (Signed)
Nucala Shipment Received: °100mg #1 vial °Medication arrival date: 11/22/19 °Lot #: [id number] °Exp date: 05/28/2023 °Received by: Christol Thetford,LPN °

## 2019-11-23 ENCOUNTER — Ambulatory Visit: Payer: Medicare Other | Attending: Internal Medicine

## 2019-11-23 DIAGNOSIS — Z23 Encounter for immunization: Secondary | ICD-10-CM

## 2019-11-23 NOTE — Progress Notes (Signed)
   Covid-19 Vaccination Clinic  Name:  [name] [name]    MRN: [id number] DOB: 1940/11/09  11/23/2019  [name] was observed post Covid-19 immunization for 15 minutes without incident. She was provided with Vaccine Information Sheet and instruction to access the V-Safe system.   [name] was instructed to call 911 with any severe reactions post vaccine: Marland Kitchen Difficulty breathing  . Swelling of face and throat  . A fast heartbeat  . A bad rash all over body  . Dizziness and weakness

## 2019-11-28 DIAGNOSIS — I10 Essential (primary) hypertension: Secondary | ICD-10-CM | POA: Diagnosis not present

## 2019-11-30 ENCOUNTER — Ambulatory Visit (INDEPENDENT_AMBULATORY_CARE_PROVIDER_SITE_OTHER): Payer: Medicare Other

## 2019-11-30 ENCOUNTER — Telehealth: Payer: Self-pay | Admitting: Physician Assistant

## 2019-11-30 ENCOUNTER — Other Ambulatory Visit: Payer: Self-pay

## 2019-11-30 DIAGNOSIS — J4520 Mild intermittent asthma, uncomplicated: Secondary | ICD-10-CM

## 2019-11-30 MED ORDER — MEPOLIZUMAB 100 MG ~~LOC~~ SOLR
100.0000 mg | Freq: Once | SUBCUTANEOUS | Status: AC
  Administered 2019-11-30: 100 mg via SUBCUTANEOUS

## 2019-11-30 NOTE — Progress Notes (Signed)
Have you been hospitalized within the last 10 days?  No Do you have a fever?  No Do you have a cough?  No Do you have a headache or sore throat? No Do you have your Epi Pen visible and is it within date?  Yes 

## 2019-11-30 NOTE — Telephone Encounter (Signed)
BP of 217/97 HR 64  She took evening hydralazine and Cardizem and BP improved to 191/94 HR 65 in 15 minutes.  Advised to check BP in 2-3 hours if above 160/90 she will take additional hydralazine in morning.   She has cough which is chronic. No chest pain or dyspnea.

## 2019-12-01 DIAGNOSIS — I10 Essential (primary) hypertension: Secondary | ICD-10-CM | POA: Diagnosis not present

## 2019-12-01 DIAGNOSIS — J069 Acute upper respiratory infection, unspecified: Secondary | ICD-10-CM | POA: Diagnosis not present

## 2019-12-01 DIAGNOSIS — J449 Chronic obstructive pulmonary disease, unspecified: Secondary | ICD-10-CM | POA: Diagnosis not present

## 2019-12-01 DIAGNOSIS — Z789 Other specified health status: Secondary | ICD-10-CM | POA: Diagnosis not present

## 2019-12-01 DIAGNOSIS — D6869 Other thrombophilia: Secondary | ICD-10-CM | POA: Diagnosis not present

## 2019-12-01 DIAGNOSIS — M87059 Idiopathic aseptic necrosis of unspecified femur: Secondary | ICD-10-CM | POA: Diagnosis not present

## 2019-12-01 DIAGNOSIS — Z299 Encounter for prophylactic measures, unspecified: Secondary | ICD-10-CM | POA: Diagnosis not present

## 2019-12-12 DIAGNOSIS — I10 Essential (primary) hypertension: Secondary | ICD-10-CM | POA: Diagnosis not present

## 2019-12-12 DIAGNOSIS — J029 Acute pharyngitis, unspecified: Secondary | ICD-10-CM | POA: Diagnosis not present

## 2019-12-12 DIAGNOSIS — Z6835 Body mass index (BMI) 35.0-35.9, adult: Secondary | ICD-10-CM | POA: Diagnosis not present

## 2019-12-12 DIAGNOSIS — Z299 Encounter for prophylactic measures, unspecified: Secondary | ICD-10-CM | POA: Diagnosis not present

## 2019-12-12 DIAGNOSIS — I4891 Unspecified atrial fibrillation: Secondary | ICD-10-CM | POA: Diagnosis not present

## 2019-12-18 ENCOUNTER — Telehealth: Payer: Self-pay | Admitting: Internal Medicine

## 2019-12-18 NOTE — Telephone Encounter (Signed)
Nucala Order: 100mg #1 Vial Order Date: 12/18/19 Expected date of arrival: 12/19/19 Ordered by: Laddie Naeem,LPN Specialty Pharmacy: [facility] 

## 2019-12-25 NOTE — Progress Notes (Signed)
Cardiology Office Note  Date: 12/26/2019   ID: [name] [name], DOB Mar 17, 1941, MRN [id number]  PCP:  Monico Blitz, MD  Cardiologist:  Rozann Lesches, MD Electrophysiologist:  Thompson Grayer, MD   Chief Complaint: Persistent atrial fibrillation  History of Present Illness: [name] [name] is a 79 y.o. female with a history of persistent atrial fibrillation, tachycardia-bradycardia syndrome, COPD, HTN, OSA.  Last saw Dr. Domenic Polite 09/25/2019.  She had been recently hospitalized was palpitations and chest discomfort.  Chest discomfort occurred after dental procedure where she thought she may have swallowed some lidocaine.  HS troponin levels were not suggestive of ACS.  Patient was in sinus bradycardia by EKG without any acute ST segment changes.  Echo revealed normal LVEF.  She was continuing Coumadin.  CHA2DS2-VASc score of 4.  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  She was tolerating AV nodal blockers without dizziness or syncope.  More recently seen in atrial fibrillation clinic on 11/08/2019 f/u for Tikosyn.  She had failed flecainide and many cardioversions in the past.  She was doing well and staying in sinus rhythm on Tikosyn.  QTC was stable.  Having very few atrial fibrillation episodes.  She denied any symptoms of palpitations, chest pain, shortness of breath, orthopnea, PND, lower extremity edema, dizziness, presyncope or syncope.  Hypertension was stable.  Asthma stable on current inhalers.  Patient states she has been doing well from a cardiac standpoint.  Has occasional episodes of atrial fibrillation usually resolved with taking the short acting Cardizem.  States she had an episode the other day which lasted approximately 6 hours.  She states she took an extra Cardizem dose which eventually resolved the issue.  States she has been walking between 6 and 10 minutes each day which seems to be helping some with her blood pressure issues.  She  brings a log of blood pressures with her which she takes at home.  She states the measurements are taken around the time she takes her blood pressure medications.  The systolic numbers range anywhere from 147-197.  States she has chronic shortness of breath but nothing out of the ordinary.  Denies any CVA or TIA-like symptoms, PND, orthopnea, orthostatic symptoms, claudication-like symptoms, DVT or PE-like symptoms, lower extremity edema.  States she has some problems with hip pain and injured her back recently has not been walking as much over the last few days.  Past Medical History:  Diagnosis Date  . Allergic rhinitis   . Anxiety   . Asthma   . Atrial flutter ([place])   . COPD (chronic obstructive pulmonary disease) ([place])   . Depression   . Essential hypertension   . Hyperlipidemia   . Lumbar disc disease   . Obesity   . OSA (obstructive sleep apnea)    CPAP  . Paroxysmal atrial fibrillation (HCC)    Element of tachycardia bradycardia syndrome  . Respiratory failure [facility])     Past Surgical History:  Procedure Laterality Date  . ANTERIOR FUSION LUMBAR SPINE    . BIOPSY  08/27/2016   Procedure: BIOPSY;  Surgeon: Rogene Houston, MD;  [facility];  Service: Endoscopy;;  FOUR GASTRIC POLYPS BIOPSIED  . CARDIOVERSION N/A 03/28/2014   Procedure: CARDIOVERSION;  Surgeon: Fay Records, MD;  Location: [facility] ORS;  Service: Cardiovascular;  Laterality: N/A;  . CARDIOVERSION N/A 10/22/2016   Procedure: CARDIOVERSION;  Surgeon: Sueanne Margarita, MD;  Location: [place];  Service: Cardiovascular;  Laterality: N/A;  . CATARACT EXTRACTION    . COLONOSCOPY N/A 08/04/2012   Procedure: COLONOSCOPY;  Surgeon: Rogene Houston, MD;  [facility];  Service: Endoscopy;  Laterality: N/A;  730-rescheduled to [place] notified pt  . ELECTROPHYSIOLOGIC STUDY N/A 09/18/2014   Procedure: Atrial Fibrillation Ablation;  Surgeon: Thompson Grayer, MD;  Location: [place] CV LAB;  Service:  Cardiovascular;  Laterality: N/A;  . ESOPHAGOGASTRODUODENOSCOPY N/A 08/27/2016   Procedure: ESOPHAGOGASTRODUODENOSCOPY (EGD);  Surgeon: Rogene Houston, MD;  [facility];  Service: Endoscopy;  Laterality: N/A;  1200  . LASIK     Both eyes  . TEE WITHOUT CARDIOVERSION N/A 09/18/2014   Procedure: TRANSESOPHAGEAL ECHOCARDIOGRAM (TEE);  Surgeon: Larey Dresser, MD;  Location: [place];  Service: Cardiovascular;  Laterality: N/A;  . TEE WITHOUT CARDIOVERSION N/A 10/22/2016   Procedure: TRANSESOPHAGEAL ECHOCARDIOGRAM (TEE);  Surgeon: Sueanne Margarita, MD;  Location: [facility] ENDOSCOPY;  Service: Cardiovascular;  Laterality: N/A;  . TEE WITHOUT CARDIOVERSION N/A 08/24/2017   Procedure: TRANSESOPHAGEAL ECHOCARDIOGRAM (TEE);  Surgeon: Josue Hector, MD;  Location: [facility] ENDOSCOPY;  Service: Cardiovascular;  Laterality: N/A;  . TOTAL HIP ARTHROPLASTY  2010   Right    Current Outpatient Medications  Medication Sig Dispense Refill  . acetaminophen (TYLENOL) 500 MG tablet Take 500 mg by mouth daily as needed for headache.    Marland Kitchen antiseptic oral rinse (BIOTENE) LIQD 15 mLs by Mouth Rinse route as needed for dry mouth.    Marland Kitchen atorvastatin (LIPITOR) 10 MG tablet Take 10 mg by mouth every evening.     . benzonatate (TESSALON) 200 MG capsule Take 1 capsule (200 mg total) by mouth 3 (three) times daily as needed for cough. 30 capsule 3  . Biotin 1 MG CAPS Take 1,000 mg by mouth 3 (three) times a week.     . budesonide (PULMICORT) 0.5 MG/2ML nebulizer solution Take 0.5 mg by nebulization daily.     . Calcium Carbonate-Vit D-Min (QC CALCIUM-MAGNESIUM-ZINC-D3) 333.4-133 MG-UNIT TABS Take 1 tablet by mouth 2 (two) times daily.     . cetirizine (ZYRTEC) 10 MG tablet Take 10 mg by mouth daily as needed for allergies.    . clindamycin (CLEOCIN) 300 MG capsule 2 capsules as needed. Take prior to dental procedures  0  . clobetasol ointment (TEMOVATE) 0.05 % SMARTSIG:Sparingly Topical PRN    . DERMA-SMOOTHE/FS SCALP  0.01 % OIL APPLY A SMALL AMOUNT TO SKIN THREE TIMES A WEEK, PART HAIR APPLY TO SCALP, COVER WITH PLASTIC CAP, [place] OUT IN THE MONING    . Dextromethorphan-guaiFENesin (MUCINEX DM) 30-600 MG TB12 Take 1-2 tablets by mouth as needed.     . diltiazem (CARDIZEM) 30 MG tablet Take 1 tablet (30 mg total) by mouth as needed (for palpitations). 45 tablet 2  . diltiazem (CARTIA XT) 120 MG 24 hr capsule Take 1 capsule (120 mg total) by mouth at bedtime. 90 capsule 2  . dofetilide (TIKOSYN) 500 MCG capsule Take 1 capsule (500 mcg total) by mouth 2 (two) times daily. 180 capsule 2  . doxycycline (ADOXA) 50 MG tablet Take 50 mg by mouth daily.    . famotidine (PEPCID) 20 MG tablet Take 2 tablets (40 mg total) by mouth at bedtime as needed. 30 tablet 1  . fluticasone (FLONASE) 50 MCG/ACT nasal spray Place 1 spray into both nostrils 2 (two) times daily.    . hydrALAZINE (APRESOLINE) 100 MG tablet Take 100 mg by mouth 2 (two) times daily.    Marland Kitchen  HYDROCODONE-HOMATROPINE PO Take 5 mLs by mouth daily as needed (cough). Taking by mouth prn for cough     . ipratropium-albuterol (DUONEB) 0.5-2.5 (3) MG/3ML SOLN Take 3 mLs by nebulization daily.     Marland Kitchen ketoconazole (NIZORAL) 2 % cream SMARTSIG:Sparingly Topical Twice Daily    . ketoconazole (NIZORAL) 2 % shampoo Apply 1 application topically 3 (three) times a week.     . Magnesium 250 MG TABS Take 250 mg by mouth daily.    . metroNIDAZOLE (METROGEL) 1 % gel APPLY TO FACIAL RASH TWICE DAILY    . montelukast (SINGULAIR) 10 MG tablet Take 10 mg by mouth at bedtime.    . pantoprazole (PROTONIX) 40 MG tablet Take 1 tablet (40 mg total) by mouth 2 (two) times daily. 60 tablet 2  . potassium chloride SA (KLOR-CON) 20 MEQ tablet Take 1 tablet (20 mEq total) by mouth daily. 90 tablet 2  . Sodium Chloride-Sodium Bicarb (AYR SALINE NASAL NETI RINSE) 1.57 g PACK Place 1 each into the nose daily as needed (rinse nasal often).    . triamcinolone ointment (KENALOG) 0.1 % Apply 1  application topically daily as needed for rash.  2  . umeclidinium-vilanterol (ANORO ELLIPTA) 62.5-25 MCG/INH AEPB Inhale 1 puff into the lungs daily. 180 each 3  . warfarin (COUMADIN) 5 MG tablet Take 5 mg by mouth as directed.    . Wheat Dextrin (BENEFIBER ON THE GO) POWD Take 1 Dose by mouth daily as needed (constipation). 2 tsp daily in the morning.    Marland Kitchen losartan (COZAAR) 100 MG tablet Take 1 tablet (100 mg total) by mouth daily. 90 tablet 2   Current Facility-Administered Medications  Medication Dose Route Frequency Provider Last Rate Last Admin  . Mepolizumab SOLR 100 mg  100 mg Subcutaneous Q28 days Baird Lyons D, MD   100 mg at 09/07/19 0947  . Mepolizumab SOLR 100 mg  100 mg Subcutaneous Q28 days Annamaria Boots, Clinton D, MD   100 mg at 11/02/19 1123   Allergies:  Bupropion hcl, Escitalopram oxalate, Fluticasone-salmeterol, Lisinopril, and Serevent   Social History: The patient  reports that she has never smoked. She has never used smokeless tobacco. She reports that she does not drink alcohol and does not use drugs.   Family History: The patient's family history includes Cancer in her mother; Colon cancer in an other family member; Heart attack in her father.   ROS:  Please see the history of present illness. Otherwise, complete review of systems is positive for none.  All other systems are reviewed and negative.   Physical Exam: VS:  BP 112/78   Pulse (!) 55   Ht 5' 3.5" (1.613 m)   Wt 194 lb (88 kg)   SpO2 98%   BMI 33.83 kg/m , BMI Body mass index is 33.83 kg/m.  Wt Readings from Last 3 Encounters:  12/26/19 194 lb (88 kg)  11/08/19 194 lb 3.2 oz (88.1 kg)  09/25/19 193 lb (87.5 kg)    General: Patient appears comfortable at rest. Neck: Supple, no elevated JVP or carotid bruits, no thyromegaly. Lungs: Clear to auscultation, nonlabored breathing at rest. Cardiac: Regular rate and rhythm, no S3 or significant systolic murmur, no pericardial rub. Extremities: No pitting  edema, distal pulses 2+. Skin: Warm and dry. Musculoskeletal: No kyphosis. Neuropsychiatric: Alert and oriented x3, affect grossly appropriate.  ECG:  EKG November 08, 2019 showed sinus bradycardia rate of 53, sinus arrhythmia, LAD, minimal voltage criteria for LVH.  Recent Labwork: 09/19/2019:  ALT 17; AST 29; Hemoglobin 11.0; Platelets 215 11/08/2019: BUN 17; Creatinine, Ser 1.08; Magnesium 2.0; Potassium 4.0; Sodium 139  No results found for: CHOL, TRIG, HDL, CHOLHDL, VLDL, LDLCALC, LDLDIRECT  Other Studies Reviewed Today:  Echocardiogram 09/20/2019: 1. Left ventricular ejection fraction, by estimation, is 60 to 65%. The  left ventricle has normal function. The left ventricle has no regional  wall motion abnormalities. There is moderate left ventricular hypertrophy.  Left ventricular diastolic  parameters are consistent with Grade II diastolic dysfunction  (pseudonormalization). Elevated left atrial pressure.  2. Right ventricular systolic function is normal. The right ventricular  size is normal. There is mildly elevated pulmonary artery systolic  pressure.  3. Left atrial size was severely dilated.  4. The mitral valve is normal in structure. Trivial mitral valve  regurgitation. No evidence of mitral stenosis.  5. The aortic valve is tricuspid. Aortic valve regurgitation is not  visualized. No aortic stenosis is present.  6. Indeterminant PASP, inadequate TR jet.  7. The inferior vena cava is dilated in size with >50% respiratory  variability, suggesting right atrial pressure of 8 mmHg.    Assessment and Plan:  1. Persistent atrial fibrillation ([place])   2. BRADYCARDIA-TACHYCARDIA SYNDROME   3. Essential hypertension, benign    1. Persistent atrial fibrillation ([place]) States she had one episode recently over the weekend of atrial fibrillation.  States she took an extra short acting Cardizem in addition to her other medications which eventually resolved to the  palpitations.  Heart rate today is 55 and regular.  Continue diltiazem CD 120 mg daily and Cardizem 30 mg as needed for palpitations.  Continue Tikosyn 500 mcg p.o. twice daily.  Continue Coumadin 5 mg p.o. as directed  2. BRADYCARDIA-TACHYCARDIA SYNDROME History of bradycardia tachy syndrome.  She sees Dr. Rayann Heman and Roderic Palau at atrial fibrillation clinic.   3.  Hypertension She brings along a log of blood pressure measurements.  She states these blood pressures are taken in the morning around the time she takes her blood pressure medications.  Systolic blood pressures are running anywhere from 762-831 systolic.  Instructed the patient to take her antihypertensive medications first thing in the morning and wait for approximately 2 to 3 hours before checking her blood pressure.  Advised her if she is performing any activity to sit down for 10 minutes and rest before checking blood pressures.  Advised her she would likely see better readings.  Blood pressure appears to be well controlled today with a blood pressure of 112/78.  Continue losartan 100 mg daily, continue hydralazine 100 mg p.o. twice daily.   Medication Adjustments/Labs and Tests Ordered: Current medicines are reviewed at length with the patient today.  Concerns regarding medicines are outlined above.   Disposition: Follow-up with Dr. Domenic Polite or APP 6 months  Signed, Levell July, NP 12/26/2019 10:34 AM    [place] at [place], [place], [place] [address] Phone: [phone]; Fax: [phone]

## 2019-12-25 NOTE — Telephone Encounter (Signed)
Nucala Shipment Received: 100mg  #1 vial Medication arrival date: 12/25/19 Lot #: [id number] Exp date: 04/30/2023 Received by: Elliot Dally

## 2019-12-26 ENCOUNTER — Encounter: Payer: Self-pay | Admitting: Family Medicine

## 2019-12-26 ENCOUNTER — Ambulatory Visit (INDEPENDENT_AMBULATORY_CARE_PROVIDER_SITE_OTHER): Payer: Medicare Other | Admitting: Family Medicine

## 2019-12-26 VITALS — BP 112/78 | HR 55 | Ht 63.5 in | Wt 194.0 lb

## 2019-12-26 DIAGNOSIS — I10 Essential (primary) hypertension: Secondary | ICD-10-CM | POA: Diagnosis not present

## 2019-12-26 DIAGNOSIS — I4819 Other persistent atrial fibrillation: Secondary | ICD-10-CM

## 2019-12-26 DIAGNOSIS — I495 Sick sinus syndrome: Secondary | ICD-10-CM

## 2019-12-26 NOTE — Patient Instructions (Signed)
Medication Instructions:  Continue all current medications.   Labwork: none  Testing/Procedures: none  Follow-Up: 6 months   Any Other Special Instructions Will Be Listed Below (If Applicable).   If you need a refill on your cardiac medications before your next appointment, please call your pharmacy.  

## 2019-12-27 DIAGNOSIS — M546 Pain in thoracic spine: Secondary | ICD-10-CM | POA: Diagnosis not present

## 2019-12-27 DIAGNOSIS — M545 Low back pain: Secondary | ICD-10-CM | POA: Diagnosis not present

## 2019-12-27 DIAGNOSIS — M542 Cervicalgia: Secondary | ICD-10-CM | POA: Diagnosis not present

## 2019-12-27 DIAGNOSIS — M9903 Segmental and somatic dysfunction of lumbar region: Secondary | ICD-10-CM | POA: Diagnosis not present

## 2019-12-27 DIAGNOSIS — M9905 Segmental and somatic dysfunction of pelvic region: Secondary | ICD-10-CM | POA: Diagnosis not present

## 2019-12-27 DIAGNOSIS — M9902 Segmental and somatic dysfunction of thoracic region: Secondary | ICD-10-CM | POA: Diagnosis not present

## 2019-12-27 DIAGNOSIS — M9901 Segmental and somatic dysfunction of cervical region: Secondary | ICD-10-CM | POA: Diagnosis not present

## 2019-12-28 ENCOUNTER — Other Ambulatory Visit: Payer: Self-pay

## 2019-12-28 ENCOUNTER — Ambulatory Visit (INDEPENDENT_AMBULATORY_CARE_PROVIDER_SITE_OTHER): Payer: Medicare Other

## 2019-12-28 ENCOUNTER — Telehealth: Payer: Self-pay | Admitting: Internal Medicine

## 2019-12-28 DIAGNOSIS — M8580 Other specified disorders of bone density and structure, unspecified site: Secondary | ICD-10-CM | POA: Diagnosis not present

## 2019-12-28 DIAGNOSIS — J4520 Mild intermittent asthma, uncomplicated: Secondary | ICD-10-CM | POA: Diagnosis not present

## 2019-12-28 DIAGNOSIS — I10 Essential (primary) hypertension: Secondary | ICD-10-CM | POA: Diagnosis not present

## 2019-12-28 DIAGNOSIS — E785 Hyperlipidemia, unspecified: Secondary | ICD-10-CM | POA: Diagnosis not present

## 2019-12-28 MED ORDER — MEPOLIZUMAB 100 MG ~~LOC~~ SOLR
100.0000 mg | Freq: Once | SUBCUTANEOUS | Status: AC
  Administered 2019-12-28: 100 mg via SUBCUTANEOUS

## 2019-12-28 NOTE — Progress Notes (Signed)
Have you been hospitalized within the last 10 days?  No Do you have a fever?  No Do you have a cough?  No Do you have a headache or sore throat? No Do you have your Epi Pen visible and is it within date?  Yes 

## 2019-12-28 NOTE — Telephone Encounter (Signed)
Spoke with Patient. Patient rescheduled for 10/28 at 1130.

## 2019-12-28 NOTE — Telephone Encounter (Signed)
ATC Patient to reschedule Nucala injection for later time. LM to call back.

## 2020-01-01 DIAGNOSIS — M9905 Segmental and somatic dysfunction of pelvic region: Secondary | ICD-10-CM | POA: Diagnosis not present

## 2020-01-01 DIAGNOSIS — M9901 Segmental and somatic dysfunction of cervical region: Secondary | ICD-10-CM | POA: Diagnosis not present

## 2020-01-01 DIAGNOSIS — M546 Pain in thoracic spine: Secondary | ICD-10-CM | POA: Diagnosis not present

## 2020-01-01 DIAGNOSIS — M9902 Segmental and somatic dysfunction of thoracic region: Secondary | ICD-10-CM | POA: Diagnosis not present

## 2020-01-01 DIAGNOSIS — M9903 Segmental and somatic dysfunction of lumbar region: Secondary | ICD-10-CM | POA: Diagnosis not present

## 2020-01-01 DIAGNOSIS — M48061 Spinal stenosis, lumbar region without neurogenic claudication: Secondary | ICD-10-CM | POA: Diagnosis not present

## 2020-01-01 DIAGNOSIS — M542 Cervicalgia: Secondary | ICD-10-CM | POA: Diagnosis not present

## 2020-01-03 DIAGNOSIS — M9905 Segmental and somatic dysfunction of pelvic region: Secondary | ICD-10-CM | POA: Diagnosis not present

## 2020-01-03 DIAGNOSIS — M48061 Spinal stenosis, lumbar region without neurogenic claudication: Secondary | ICD-10-CM | POA: Diagnosis not present

## 2020-01-03 DIAGNOSIS — M542 Cervicalgia: Secondary | ICD-10-CM | POA: Diagnosis not present

## 2020-01-03 DIAGNOSIS — M9903 Segmental and somatic dysfunction of lumbar region: Secondary | ICD-10-CM | POA: Diagnosis not present

## 2020-01-03 DIAGNOSIS — M546 Pain in thoracic spine: Secondary | ICD-10-CM | POA: Diagnosis not present

## 2020-01-03 DIAGNOSIS — M9902 Segmental and somatic dysfunction of thoracic region: Secondary | ICD-10-CM | POA: Diagnosis not present

## 2020-01-03 DIAGNOSIS — M9901 Segmental and somatic dysfunction of cervical region: Secondary | ICD-10-CM | POA: Diagnosis not present

## 2020-01-08 ENCOUNTER — Telehealth: Payer: Self-pay | Admitting: Internal Medicine

## 2020-01-08 NOTE — Telephone Encounter (Signed)
Paged by [name] [name] earlier this evening re indigestion. She has been having burning lower chest discomfort since her dental appointment earlier today (similar to a prior episode). She took pepcid earlier, and this did not seem to help to much. She was wondering if there was anything else she could take. I suggested she trial Tums/Maalox/Gas-X for symptom management. She reports her BP was elevated into the 190s-200s. She missed her hydralzine earlier today, and took her 100 mg dose around 730 this evening. She denies any headaches, SOB, chest pain (beyond reflux), etc. She would like to check her BP again around midnight, and I advised her it would be ok to take an additional hydralazine around that time if her SBP is still > 200.

## 2020-01-10 NOTE — Telephone Encounter (Signed)
Thank You. I agree with your assessment and plan. Thanks Again

## 2020-01-11 DIAGNOSIS — I10 Essential (primary) hypertension: Secondary | ICD-10-CM | POA: Diagnosis not present

## 2020-01-11 DIAGNOSIS — Z6835 Body mass index (BMI) 35.0-35.9, adult: Secondary | ICD-10-CM | POA: Diagnosis not present

## 2020-01-11 DIAGNOSIS — Z299 Encounter for prophylactic measures, unspecified: Secondary | ICD-10-CM | POA: Diagnosis not present

## 2020-01-11 DIAGNOSIS — I4891 Unspecified atrial fibrillation: Secondary | ICD-10-CM | POA: Diagnosis not present

## 2020-01-11 DIAGNOSIS — Z23 Encounter for immunization: Secondary | ICD-10-CM | POA: Diagnosis not present

## 2020-01-25 ENCOUNTER — Other Ambulatory Visit: Payer: Self-pay

## 2020-01-25 ENCOUNTER — Ambulatory Visit: Payer: Medicare Other

## 2020-01-25 ENCOUNTER — Ambulatory Visit (INDEPENDENT_AMBULATORY_CARE_PROVIDER_SITE_OTHER): Payer: Medicare Other

## 2020-01-25 DIAGNOSIS — J4520 Mild intermittent asthma, uncomplicated: Secondary | ICD-10-CM

## 2020-01-25 DIAGNOSIS — H5213 Myopia, bilateral: Secondary | ICD-10-CM | POA: Diagnosis not present

## 2020-01-25 DIAGNOSIS — H04123 Dry eye syndrome of bilateral lacrimal glands: Secondary | ICD-10-CM | POA: Diagnosis not present

## 2020-01-25 MED ORDER — MEPOLIZUMAB 100 MG/ML ~~LOC~~ SOSY
100.0000 mg | PREFILLED_SYRINGE | Freq: Once | SUBCUTANEOUS | Status: AC
  Administered 2020-01-25: 100 mg via SUBCUTANEOUS

## 2020-01-25 NOTE — Progress Notes (Signed)
Have you been hospitalized within the last 10 days?  No Do you have a fever?  No Do you have a cough?  No Do you have a headache or sore throat? No Do you have your Epi Pen visible and is it within date?  Yes 

## 2020-01-26 DIAGNOSIS — I10 Essential (primary) hypertension: Secondary | ICD-10-CM | POA: Diagnosis not present

## 2020-01-26 DIAGNOSIS — E785 Hyperlipidemia, unspecified: Secondary | ICD-10-CM | POA: Diagnosis not present

## 2020-01-26 DIAGNOSIS — M8580 Other specified disorders of bone density and structure, unspecified site: Secondary | ICD-10-CM | POA: Diagnosis not present

## 2020-01-27 DIAGNOSIS — I10 Essential (primary) hypertension: Secondary | ICD-10-CM | POA: Diagnosis not present

## 2020-01-29 ENCOUNTER — Telehealth: Payer: Self-pay | Admitting: Medical

## 2020-01-29 NOTE — Telephone Encounter (Signed)
   Patient called the after hours line to report increased blood pressures. BP 214/96 initially, though SBP improved to 185 during my call. HR down to 56 bpm. She reports compliance with her medications and took her hydralazine 100mg  and diltiazem 120mg  at 6pm. On review of office visit, BP is generally very well controlled making me question the accuracy of her home monitor as she reports she typically has an SBP in the 160s at home. She did lift a heavy bag of cat food this evening which caused her some SOB which she states is new. This resolved with rest. No complaints of chest pain, SOB at rest, HA, dizziness, lightheadedness, or vision changes. Will avoid additional diltiazem given HR in the 50s. She is on the max dose of losartan. Recommended she take an additional 50mg  hydralazine now.  We discussed ED precautions. Will route to schedulers to assist with an appointment as she reports increased fatigue and DOE recently. Instructed her to bring her BP cuff to that appointment to check accuracy. She was appreciative of the call and in agreement with the plan.  Abigail Butts, PA-C 01/29/20; 8:03 PM

## 2020-01-30 ENCOUNTER — Ambulatory Visit (INDEPENDENT_AMBULATORY_CARE_PROVIDER_SITE_OTHER): Payer: Medicare Other | Admitting: Family Medicine

## 2020-01-30 ENCOUNTER — Telehealth: Payer: Self-pay | Admitting: Family Medicine

## 2020-01-30 ENCOUNTER — Encounter: Payer: Self-pay | Admitting: Family Medicine

## 2020-01-30 ENCOUNTER — Encounter: Payer: Self-pay | Admitting: *Deleted

## 2020-01-30 VITALS — BP 142/58 | HR 67 | Ht 63.5 in | Wt 196.2 lb

## 2020-01-30 DIAGNOSIS — R52 Pain, unspecified: Secondary | ICD-10-CM | POA: Diagnosis not present

## 2020-01-30 DIAGNOSIS — G4489 Other headache syndrome: Secondary | ICD-10-CM | POA: Diagnosis not present

## 2020-01-30 DIAGNOSIS — I495 Sick sinus syndrome: Secondary | ICD-10-CM

## 2020-01-30 DIAGNOSIS — I4819 Other persistent atrial fibrillation: Secondary | ICD-10-CM

## 2020-01-30 DIAGNOSIS — R42 Dizziness and giddiness: Secondary | ICD-10-CM | POA: Diagnosis not present

## 2020-01-30 DIAGNOSIS — R0609 Other forms of dyspnea: Secondary | ICD-10-CM

## 2020-01-30 DIAGNOSIS — R5383 Other fatigue: Secondary | ICD-10-CM | POA: Diagnosis not present

## 2020-01-30 DIAGNOSIS — R0600 Dyspnea, unspecified: Secondary | ICD-10-CM

## 2020-01-30 DIAGNOSIS — I10 Essential (primary) hypertension: Secondary | ICD-10-CM

## 2020-01-30 DIAGNOSIS — R079 Chest pain, unspecified: Secondary | ICD-10-CM

## 2020-01-30 DIAGNOSIS — R5381 Other malaise: Secondary | ICD-10-CM | POA: Diagnosis not present

## 2020-01-30 MED ORDER — CLONIDINE HCL 0.1 MG PO TABS: 0.1000 mg | ORAL_TABLET | Freq: Two times a day (BID) | ORAL | 6 refills | Status: DC

## 2020-01-30 NOTE — Progress Notes (Signed)
Cardiology Office Note  Date: 01/30/2020   ID: [name] [name], DOB [birth year], MRN [id number]  PCP:  Monico Blitz, MD  Cardiologist:  Rozann Lesches, MD Electrophysiologist:  Thompson Grayer, MD   Chief Complaint: Persistent atrial fibrillation  History of Present Illness: [name] [name] is a 79 y.o. female with a history of persistent atrial fibrillation, tachycardia-bradycardia syndrome, COPD, HTN, OSA.  Last saw Dr. Domenic Polite 09/25/2019.  She had been recently hospitalized with palpitations and chest discomfort.  Chest discomfort occurred after dental procedure where she thought she may have swallowed some lidocaine.  HS troponin levels were not suggestive of ACS.  Patient was in sinus bradycardia by EKG without any acute ST segment changes.  Echo revealed normal LVEF.  She was continuing Coumadin.  CHA2DS2-VASc score of 4.  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  She was tolerating AV nodal blockers without dizziness or syncope.  More recently seen in atrial fibrillation clinic on 11/08/2019 f/u for Tikosyn.  She had failed flecainide and many cardioversions in the past.  She was doing well and staying in sinus rhythm on Tikosyn.  QTC was stable.  Having very few atrial fibrillation episodes.  She denied any symptoms of palpitations, chest pain, shortness of breath, orthopnea, PND, lower extremity edema, dizziness, presyncope or syncope.  Hypertension was stable.  Asthma stable on current inhalers.  At last visit patient stated she had been doing well from a cardiac standpoint.  Had occasional episodes of atrial fibrillation usually resolved with taking the short acting Cardizem. She had an episode prior which lasted approximately 6 hours.  She stated she took an extra Cardizem dose which eventually resolved the issue.  She had been walking between 6 and 10 minutes each day which seemed to be helping some with her blood pressure issues.  She brought  a log of blood pressures with her.  She stated the measurements were taken around the time she took her blood pressure medications.  The systolic numbers range anywhere from 147-197. She has chronic shortness of breath but nothing out of the ordinary.  Denies any CVA or TIA-like symptoms, PND, orthopnea, orthostatic symptoms, claudication-like symptoms, DVT or PE-like symptoms, lower extremity edema.  She was having some problems with hip pain and injured her back recently and had not been walking as much.  Presents today with complaints of elevated blood pressures and increasing dyspnea on exertion.  Brings a log of blood pressures with mostly elevated readings.  Admits to some increasing fatigue and sleeping a fair amount recently.  States she is compliant with all her antihypertensive medications.  Compliant with her CPAP.  She is tolerating her Tikosyn.  Denies any significant palpitations or arrhythmias or irregular heart rhythms.  Denies any PND or orthopnea.  No claudication or DVT-like symptoms.  No lower extremity edema.  No CVA or TIA-like symptoms,   Past Medical History:  Diagnosis Date   Allergic rhinitis    Anxiety    Asthma    Atrial flutter (HCC)    COPD (chronic obstructive pulmonary disease) (HCC)    Depression    Essential hypertension    Hyperlipidemia    Lumbar disc disease    Obesity    OSA (obstructive sleep apnea)    CPAP   Paroxysmal atrial fibrillation (HCC)    Element of tachycardia bradycardia syndrome   Respiratory failure ([place])     Past Surgical History:  Procedure Laterality Date   ANTERIOR FUSION  LUMBAR SPINE     BIOPSY  08/27/2016   Procedure: BIOPSY;  Surgeon: Rogene Houston, MD;  [facility]: [facility];  Service: Endoscopy;;  FOUR GASTRIC POLYPS BIOPSIED   CARDIOVERSION N/A 03/28/2014   Procedure: CARDIOVERSION;  Surgeon: Fay Records, MD;  Location: [facility] ORS;  Service: Cardiovascular;  Laterality: N/A;   CARDIOVERSION N/A 10/22/2016    Procedure: CARDIOVERSION;  Surgeon: Sueanne Margarita, MD;  Location: [facility] ENDOSCOPY;  Service: Cardiovascular;  Laterality: N/A;   CATARACT EXTRACTION     COLONOSCOPY N/A 08/04/2012   Procedure: COLONOSCOPY;  Surgeon: Rogene Houston, MD;  Location: [facility];  Service: Endoscopy;  Laterality: N/A;  730-rescheduled to [place] notified pt   ELECTROPHYSIOLOGIC STUDY N/A 09/18/2014   Procedure: Atrial Fibrillation Ablation;  Surgeon: Thompson Grayer, MD;  Location: [facility] CV LAB;  Service: Cardiovascular;  Laterality: N/A;   ESOPHAGOGASTRODUODENOSCOPY N/A 08/27/2016   Procedure: ESOPHAGOGASTRODUODENOSCOPY (EGD);  Surgeon: Rogene Houston, MD;  [facility]: [facility];  Service: Endoscopy;  Laterality: N/A;  1200   LASIK     Both eyes   TEE WITHOUT CARDIOVERSION N/A 09/18/2014   Procedure: TRANSESOPHAGEAL ECHOCARDIOGRAM (TEE);  Surgeon: Larey Dresser, MD;  Location: [facility];  Service: Cardiovascular;  Laterality: N/A;   TEE WITHOUT CARDIOVERSION N/A 10/22/2016   Procedure: TRANSESOPHAGEAL ECHOCARDIOGRAM (TEE);  Surgeon: Sueanne Margarita, MD;  Location: [facility] ENDOSCOPY;  Service: Cardiovascular;  Laterality: N/A;   TEE WITHOUT CARDIOVERSION N/A 08/24/2017   Procedure: TRANSESOPHAGEAL ECHOCARDIOGRAM (TEE);  Surgeon: Josue Hector, MD;  Location: [facility] ENDOSCOPY;  Service: Cardiovascular;  Laterality: N/A;   TOTAL HIP ARTHROPLASTY  2010   Right    Current Outpatient Medications  Medication Sig Dispense Refill   acetaminophen (TYLENOL) 500 MG tablet Take 500 mg by mouth daily as needed for headache.     antiseptic oral rinse (BIOTENE) LIQD 15 mLs by Mouth Rinse route as needed for dry mouth.     atorvastatin (LIPITOR) 10 MG tablet Take 10 mg by mouth every evening.      benzonatate (TESSALON) 200 MG capsule Take 1 capsule (200 mg total) by mouth 3 (three) times daily as needed for cough. 30 capsule 3   Biotin 1 MG CAPS Take 1,000 mg by mouth 3 (three) times a week.       budesonide (PULMICORT) 0.5 MG/2ML nebulizer solution Take 0.5 mg by nebulization daily.      Calcium Carbonate-Vit D-Min (QC CALCIUM-MAGNESIUM-ZINC-D3) 333.4-133 MG-UNIT TABS Take 1 tablet by mouth 2 (two) times daily.      cetirizine (ZYRTEC) 10 MG tablet Take 10 mg by mouth daily as needed for allergies.     clindamycin (CLEOCIN) 300 MG capsule 2 capsules as needed. Take prior to dental procedures  0   clobetasol ointment (TEMOVATE) 0.05 % SMARTSIG:Sparingly Topical PRN     DERMA-SMOOTHE/FS SCALP 0.01 % OIL APPLY A SMALL AMOUNT TO SKIN THREE TIMES A WEEK, PART HAIR APPLY TO SCALP, COVER WITH PLASTIC CAP, WASH OUT IN THE MONING     Dextromethorphan-guaiFENesin (MUCINEX DM) 30-600 MG TB12 Take 1-2 tablets by mouth as needed.      diltiazem (CARDIZEM) 30 MG tablet Take 1 tablet (30 mg total) by mouth as needed (for palpitations). 45 tablet 2   diltiazem (CARTIA XT) 120 MG 24 hr capsule Take 1 capsule (120 mg total) by mouth at bedtime. 90 capsule 2   dofetilide (TIKOSYN) 500 MCG capsule Take 1 capsule (500 mcg total) by mouth 2 (two)  times daily. 180 capsule 2   famotidine (PEPCID) 20 MG tablet Take 2 tablets (40 mg total) by mouth at bedtime as needed. 30 tablet 1   fluticasone (FLONASE) 50 MCG/ACT nasal spray Place 1 spray into both nostrils 2 (two) times daily.     hydrALAZINE (APRESOLINE) 100 MG tablet Take 100 mg by mouth 2 (two) times daily.     HYDROCODONE-HOMATROPINE PO Take 5 mLs by mouth daily as needed (cough). Taking by mouth prn for cough      ipratropium-albuterol (DUONEB) 0.5-2.5 (3) MG/3ML SOLN Take 3 mLs by nebulization daily.      ketoconazole (NIZORAL) 2 % cream SMARTSIG:Sparingly Topical Twice Daily     ketoconazole (NIZORAL) 2 % shampoo Apply 1 application topically 3 (three) times a week.      losartan (COZAAR) 100 MG tablet Take 1 tablet (100 mg total) by mouth daily. 90 tablet 2   Magnesium 250 MG TABS Take 250 mg by mouth daily.     metroNIDAZOLE  (METROGEL) 1 % gel APPLY TO FACIAL RASH TWICE DAILY     montelukast (SINGULAIR) 10 MG tablet Take 10 mg by mouth at bedtime.     pantoprazole (PROTONIX) 40 MG tablet Take 40 mg by mouth in the morning.     potassium chloride SA (KLOR-CON) 20 MEQ tablet Take 1 tablet (20 mEq total) by mouth daily. 90 tablet 2   Sodium Chloride-Sodium Bicarb (AYR SALINE NASAL NETI RINSE) 1.57 g PACK Place 1 each into the nose daily as needed (rinse nasal often).     triamcinolone ointment (KENALOG) 0.1 % Apply 1 application topically daily as needed for rash.  2   umeclidinium-vilanterol (ANORO ELLIPTA) 62.5-25 MCG/INH AEPB Inhale 1 puff into the lungs daily. 180 each 3   warfarin (COUMADIN) 5 MG tablet Take 5 mg by mouth as directed.     Wheat Dextrin (BENEFIBER ON THE GO) POWD Take 1 Dose by mouth daily as needed (constipation). 2 tsp daily in the morning.     Current Facility-Administered Medications  Medication Dose Route Frequency Provider Last Rate Last Admin   Mepolizumab SOLR 100 mg  100 mg Subcutaneous Q28 days Baird Lyons D, MD   100 mg at 09/07/19 0947   Mepolizumab SOLR 100 mg  100 mg Subcutaneous Q28 days Baird Lyons D, MD   100 mg at 11/02/19 1123   Allergies:  Bupropion hcl, Escitalopram oxalate, Fluticasone-salmeterol, Lisinopril, and Serevent   Social History: The patient  reports that she has never smoked. She has never used smokeless tobacco. She reports that she does not drink alcohol and does not use drugs.   Family History: The patient's family history includes Cancer in her mother; Colon cancer in an other family member; Heart attack in her father.   ROS:  Please see the history of present illness. Otherwise, complete review of systems is positive for none.  All other systems are reviewed and negative.   Physical Exam: VS:  BP (!) 142/58    Pulse 67    Ht 5' 3.5" (1.613 m)    Wt 196 lb 3.2 oz (89 kg)    SpO2 96%    BMI 34.21 kg/m , BMI Body mass index is 34.21  kg/m.  Wt Readings from Last 3 Encounters:  01/30/20 196 lb 3.2 oz (89 kg)  12/26/19 194 lb (88 kg)  11/08/19 194 lb 3.2 oz (88.1 kg)    General: Patient appears comfortable at rest. Neck: Supple, no elevated JVP or carotid bruits,  no thyromegaly. Lungs: Clear to auscultation, nonlabored breathing at rest. Cardiac: Regular rate and rhythm, no S3 or significant systolic murmur, no pericardial rub. Extremities: No pitting edema, distal pulses 2+. Skin: Warm and dry. Musculoskeletal: No kyphosis. Neuropsychiatric: Alert and oriented x3, affect grossly appropriate.  ECG:  EKG November 08, 2019 showed sinus bradycardia rate of 53, sinus arrhythmia, LAD, minimal voltage criteria for LVH.  Recent Labwork: 09/19/2019: ALT 17; AST 29; Hemoglobin 11.0; Platelets 215 11/08/2019: BUN 17; Creatinine, Ser 1.08; Magnesium 2.0; Potassium 4.0; Sodium 139  No results found for: CHOL, TRIG, HDL, CHOLHDL, VLDL, LDLCALC, LDLDIRECT  Other Studies Reviewed Today:  Echocardiogram 09/20/2019: 1. Left ventricular ejection fraction, by estimation, is 60 to 65%. The  left ventricle has normal function. The left ventricle has no regional  wall motion abnormalities. There is moderate left ventricular hypertrophy.  Left ventricular diastolic  parameters are consistent with Grade II diastolic dysfunction  (pseudonormalization). Elevated left atrial pressure.  2. Right ventricular systolic function is normal. The right ventricular  size is normal. There is mildly elevated pulmonary artery systolic  pressure.  3. Left atrial size was severely dilated.  4. The mitral valve is normal in structure. Trivial mitral valve  regurgitation. No evidence of mitral stenosis.  5. The aortic valve is tricuspid. Aortic valve regurgitation is not  visualized. No aortic stenosis is present.  6. Indeterminant PASP, inadequate TR jet.  7. The inferior vena cava is dilated in size with >50% respiratory  variability,  suggesting right atrial pressure of 8 mmHg.    Assessment and Plan:   1.  Dyspnea on exertion Complaining of dyspnea on exertion as well as some chest pain/tightness.Recent echocardiogram in June showed EF of 60 to 65%, moderate LVH, grade 2 DD, severe left atrial dilation, trivial mitral valve regurgitation.  States she has not had a stress test since 2012.  Dyspnea could be an anginal equivalent.  Please get a Lexiscan Myoview stress test.  2.  Fatigue States she has been noticing increasing fatigue and sleeping a lot.  She admits to being compliant with her CPAP therapy.   3. Persistent atrial fibrillation (HCC) No recent palpitations per her statement.  Heart rates on vital sign log show heart rate ranges anywhere from high 40s to mid 60s.  Heart rate today is 67 and regular.  Continue diltiazem CD 120 mg daily and Cardizem 30 mg as needed for palpitations.  Continue Tikosyn 500 mcg p.o. twice daily.  Continue Coumadin 5 mg p.o. as directed  4.. BRADYCARDIA-TACHYCARDIA SYNDROME History of bradycardia tachy syndrome.  She sees Dr. Rayann Heman and Roderic Palau at atrial fibrillation clinic.   5.  Hypertension She brings with her a log of blood pressures beginning on 11/29/2019 until today.  Blood pressures range anywhere from 662  to 947 Systolic.  Diastolic blood pressures range anywhere from 69-97.  Dates she has been compliant with her antihypertensive medications.  Please add clonidine 0.1 mg p.o. twice daily to current regimen.  Continue losartan 100 mg daily, continue hydralazine 100 mg p.o. twice daily.   Medication Adjustments/Labs and Tests Ordered: Current medicines are reviewed at length with the patient today.  Concerns regarding medicines are outlined above.   Disposition: Follow-up with Dr. Domenic Polite or APP 6 to 8 weeks  Signed, Levell July, NP 01/30/2020 3:08 PM    [facility] at [address], [place], [place] [address] Phone: [phone];  Fax: [phone]

## 2020-01-30 NOTE — Patient Instructions (Addendum)
Medication Instructions:   Begin Clonidine 0.1mg  twice a day   Continue all other medications.    Labwork: none  Testing/Procedures:  Your physician has requested that you have a lexiscan myoview. For further information please visit [email or web address]. Please follow instruction sheet, as given.  Office will contact with results via phone or letter.    Follow-Up: 6-8 weeks   Any Other Special Instructions Will Be Listed Below (If Applicable).  If you need a refill on your cardiac medications before your next appointment, please call your pharmacy.

## 2020-01-30 NOTE — Telephone Encounter (Signed)
Pre-cert Verification for the following procedure    LEXISCAN   DATE:02/01/2020  LOCATION: [facility]

## 2020-02-01 ENCOUNTER — Ambulatory Visit (HOSPITAL_BASED_OUTPATIENT_CLINIC_OR_DEPARTMENT_OTHER)
Admission: RE | Admit: 2020-02-01 | Discharge: 2020-02-01 | Disposition: A | Discharge status: Home / Self Care | Payer: Medicare Other | Source: Ambulatory Visit | Attending: Family Medicine | Admitting: Family Medicine

## 2020-02-01 ENCOUNTER — Other Ambulatory Visit: Payer: Self-pay

## 2020-02-01 ENCOUNTER — Ambulatory Visit (HOSPITAL_COMMUNITY)
Admission: RE | Admit: 2020-02-01 | Discharge: 2020-02-01 | Disposition: A | Discharge status: Home / Self Care | Payer: Medicare Other | Source: Ambulatory Visit | Attending: Internal Medicine | Admitting: Internal Medicine

## 2020-02-01 ENCOUNTER — Encounter (HOSPITAL_COMMUNITY): Payer: Self-pay

## 2020-02-01 ENCOUNTER — Telehealth: Payer: Self-pay | Admitting: Family Medicine

## 2020-02-01 DIAGNOSIS — R0609 Other forms of dyspnea: Secondary | ICD-10-CM

## 2020-02-01 DIAGNOSIS — R0600 Dyspnea, unspecified: Secondary | ICD-10-CM | POA: Diagnosis not present

## 2020-02-01 DIAGNOSIS — R079 Chest pain, unspecified: Secondary | ICD-10-CM

## 2020-02-01 LAB — NM MYOCAR MULTI W/SPECT W/WALL MOTION / EF
LV dias vol: 79 mL (ref 46–106)
LV sys vol: 29 mL
Peak HR: 64 {beats}/min
RATE: 0.39
Rest HR: 45 {beats}/min
SDS: 4
SRS: 4
SSS: 8
TID: 1.14

## 2020-02-01 MED ORDER — TECHNETIUM TC 99M TETROFOSMIN IV KIT
10.0000 | PACK | Freq: Once | INTRAVENOUS | Status: AC | PRN
  Administered 2020-02-01: 11 via INTRAVENOUS

## 2020-02-01 MED ORDER — TECHNETIUM TC 99M TETROFOSMIN IV KIT
30.0000 | PACK | Freq: Once | INTRAVENOUS | Status: AC | PRN
  Administered 2020-02-01: 33 via INTRAVENOUS

## 2020-02-01 MED ORDER — SODIUM CHLORIDE FLUSH 0.9 % IV SOLN
INTRAVENOUS | Status: AC
  Administered 2020-02-01: 10 mL via INTRAVENOUS
  Filled 2020-02-01: qty 10

## 2020-02-01 MED ORDER — REGADENOSON 0.4 MG/5ML IV SOLN
INTRAVENOUS | Status: AC
  Administered 2020-02-01: 0.4 mg via INTRAVENOUS
  Filled 2020-02-01: qty 5

## 2020-02-01 NOTE — Telephone Encounter (Signed)
Pt called stating she started the cloNIDine (CATAPRES) 0.1 MG tablet [[id number]]  Yesterday when she picked it up and one last night around 8 and one this morning around 6am with her regular medication  Her BP this morning was 127/47 and heart rate was 47 when she got home from her stress test this morning.   Please give pt a call @ [phone]

## 2020-02-01 NOTE — Telephone Encounter (Signed)
Advised to stop clonidine and continue monitoring BP & HR. Verbalized understanding of plan.

## 2020-02-02 ENCOUNTER — Other Ambulatory Visit (HOSPITAL_COMMUNITY): Payer: Medicare Other

## 2020-02-02 ENCOUNTER — Telehealth: Payer: Self-pay | Admitting: *Deleted

## 2020-02-02 ENCOUNTER — Encounter (HOSPITAL_COMMUNITY): Payer: Medicare Other

## 2020-02-02 NOTE — Telephone Encounter (Signed)
Laurine Blazer, LPN  15/0/4136 4:38 PM EDT Back to Top    Notified, copy to pcp.    Verta Ellen., NP  02/01/2020 4:52 PM EDT     Please call the patient and let her know that the stress test was normal. This was considered a low risk study per Dr. Harl Bowie. Thank you

## 2020-02-07 ENCOUNTER — Encounter: Payer: Self-pay | Admitting: Internal Medicine

## 2020-02-07 NOTE — Progress Notes (Signed)
HPI female never smoker followed for OSA, allergic rhinitis, asthma, Hyper IgE,  complicated by A. Fib/Coumadin, HBP, GERD, obesity NPSG prior to EMR in 2012 Office Spirometry 09/02/16-WNL. FVC 2.06/76%, FEV1 1.57/77%, ratio 0.76, FEF 25-75% 1.26/80% Allergy labs 09/02/16- eosinophils 200, total IgE 4230 causing elevation of all specific allergen antibody levels including Aspergillus. Nucala  + 2018 PFT-10/13/16-minimal obstruction without response to dilator, minimal reduction of diffusion. FVC 2.02/77%, FEV1 1.62/83%, ratio 0.80, TLC 92%, DLCO 77% CT soft tissue neck 09/22/16-1. 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 Subsegmental atelectasis and nonspecific linear patchy densities in the lungs as described. They have a benign appearance.  Small hyperdense masses in the mediastinum are stable compared with 2015 supporting benign etiology such as ectopic thyroid tissue. Three-vessel prominent coronary artery calcification. Bibasilar bronchiolectasis. Labs 09/20/2017-WBC 14,900, hemoglobin 11.2, eosinophils absent, lymphocytes low( ?steroids or viral?), BNP 220, CMET wnl IgE 04/12/2018- 2,574, 10/16/16- 4,302, 11/09/13- 3,513 Office Spirometry 04/12/2018-mild restriction of exhaled volume.  Possible mild obstructive airways.  FVC 1.9/73%, FEV1 1.4/75%, ratio 1.76, FEF 25-75% 1.2/80%  -------------------------------------------------------------------------------------------  08/08/19- 79 year old female never smoker followed for OSA, allergic rhinitis, asthma/ Nucala, Hyper IgE, complicated by A. Fib/Coumadin, HBP, GERD, obesity CPAP 9/[facility] 2 Moderna Covax Download compliance 100%, AHI 2.4/ hr Proair hfa, Pulmicort neb solu, neb Duoneb, Singulair, flonase, Anoro, Body weight today 195 lbs    Weight at home 187-191 Has been feeling better this whole year.  Followed at AFib clinic with better control. Cardiac  stable. Continues Nucala. She agrees she could give her onw injections if required by insurance.  Comfortable with CPAP- no concerns.  02/08/20- 79 year old female never smoker followed for OSA, allergic rhinitis, asthma/ Nucala, Hyper IgE, complicated by PA. Fib/Coumadin, HBP, GERD, obesity Neb Pulmicort/  Duoneb   Anoro, Flonase, Zyrtec, Singulair,  CPAP 9/[facility]-supplies, machine-[employer] Download- pending Body weight today- 199 lbs Covid vax- 3 Moderna Flu vax- had Follows with cardiology- EF 60-65%, LVH, Gr2DD, LAE, Nuclear stress test WNL Stable use of CPAP. Sneezing and stuffy nose, esp L nostril, all summer. Using saline rinse and flonase. Easy DOE persists. Can walk about 6 minutes at a time- trying to extend this. Agrees overall she is doing better and with few exacerbations, compared to 1-2 years ago. Likes Anoro.  CXR 09/19/19- IMPRESSION: Mild cardiomegaly without focal airspace disease.  ROS-see HPI   + = positive Constitutional:   No-   weight loss, night sweats, fevers, chills, fatigue, lassitude. HEENT:   No-  headaches, difficulty swallowing, tooth/dental problems, sore throat,       No-  sneezing, itching,  ear ache,  +nasal congestion, post nasal drip,  CV:  No-   chest pain, orthopnea, PND, swelling in lower extremities, anasarca, dizziness, palpitations Resp: +  shortness of breath with exertion or at rest.                productive cough,  + non-productive cough,  No- coughing up of blood.              No-change in color of mucus. + wheezing.   Skin: No-   rash or lesions. GI:  No-   heartburn, indigestion, abdominal pain, nausea, vomiting, GU:  MS:  No-   joint pain or swelling. . Neuro-     nothing unusual Psych:  No- change in mood or affect. No depression or anxiety.  No memory loss.  OBJ- Physical Exam General- Alert, Oriented, Affect-appropriate, Distress- none  acute, + obese Skin- rash-none, lesions- none, excoriation- none Lymphadenopathy-  none Head- atraumatic            Eyes- Gross vision intact, PERRLA, conjunctivae and secretions clear            Ears- Hearing, canals-normal            Nose- +turbinate edema, no-Septal dev, mucus, polyps, erosion, perforation             Throat- Mallampati III-IV , mucosa- not red,  drainage- none, tonsils- atrophic Neck- flexible , trachea midline, no stridor , thyroid nl, carotid no bruit Chest - symmetrical excursion , unlabored           Heart/CV- RR/ very faint (no pacemaker) , no murmur , no gallop  , no rub, nl  s1 s2                  - JVD- none , edema- none, stasis changes- none, varices- none           Lung- + clear  Wheeze-none , cough-none,                         dullness-none, rub- none           Chest wall-  Abd- Br/ Gen/ Rectal- Not done, not indicated Extrem- cyanosis- none, clubbing, none, atrophy- none, strength- nl Neuro- grossly intact to observation

## 2020-02-08 ENCOUNTER — Encounter: Payer: Self-pay | Admitting: Internal Medicine

## 2020-02-08 ENCOUNTER — Other Ambulatory Visit: Payer: Self-pay

## 2020-02-08 ENCOUNTER — Ambulatory Visit (INDEPENDENT_AMBULATORY_CARE_PROVIDER_SITE_OTHER): Payer: Medicare Other | Admitting: Internal Medicine

## 2020-02-08 DIAGNOSIS — J4520 Mild intermittent asthma, uncomplicated: Secondary | ICD-10-CM

## 2020-02-08 DIAGNOSIS — R0602 Shortness of breath: Secondary | ICD-10-CM | POA: Diagnosis not present

## 2020-02-08 DIAGNOSIS — J3089 Other allergic rhinitis: Secondary | ICD-10-CM

## 2020-02-08 DIAGNOSIS — G4733 Obstructive sleep apnea (adult) (pediatric): Secondary | ICD-10-CM

## 2020-02-08 DIAGNOSIS — J302 Other seasonal allergic rhinitis: Secondary | ICD-10-CM | POA: Diagnosis not present

## 2020-02-08 NOTE — Assessment & Plan Note (Signed)
Mild persistent uncomplicated. Anoro has helped Plan- continue current meds

## 2020-02-08 NOTE — Assessment & Plan Note (Signed)
She feels she is coping with use of saline and flonase. Has to avoid decongestants due to AFib, and antihistamines due to severe dry eye. Plan- try nasal strips

## 2020-02-08 NOTE — Assessment & Plan Note (Signed)
Download pending. We are tryig to get call through to [employer] for this. She only gets supplies from [employer]. Plan- continue CPAP 9

## 2020-02-08 NOTE — Patient Instructions (Signed)
We can continue current meds. I'm glad Anoro has helped.  Consider trying otc Breathe Right nasal strips to help some with stuffy nose  Please call if we can help

## 2020-02-08 NOTE — Assessment & Plan Note (Signed)
DOE seems to include mild lung and cardiac components, but obesity-hypoventilation and deconditioning are primary problems.  Plan- walk as able, try to build endurance. She will return to [facility] when she feels it is safe with Covid pandemic.

## 2020-02-12 DIAGNOSIS — I4891 Unspecified atrial fibrillation: Secondary | ICD-10-CM | POA: Diagnosis not present

## 2020-02-12 DIAGNOSIS — L719 Rosacea, unspecified: Secondary | ICD-10-CM | POA: Diagnosis not present

## 2020-02-12 DIAGNOSIS — I10 Essential (primary) hypertension: Secondary | ICD-10-CM | POA: Diagnosis not present

## 2020-02-12 DIAGNOSIS — Z299 Encounter for prophylactic measures, unspecified: Secondary | ICD-10-CM | POA: Diagnosis not present

## 2020-02-14 ENCOUNTER — Other Ambulatory Visit: Payer: Self-pay

## 2020-02-14 ENCOUNTER — Encounter: Payer: Self-pay | Admitting: Podiatry

## 2020-02-14 ENCOUNTER — Other Ambulatory Visit (HOSPITAL_COMMUNITY): Payer: Self-pay | Admitting: *Deleted

## 2020-02-14 ENCOUNTER — Ambulatory Visit (INDEPENDENT_AMBULATORY_CARE_PROVIDER_SITE_OTHER): Payer: Medicare Other | Admitting: Podiatry

## 2020-02-14 DIAGNOSIS — M2021 Hallux rigidus, right foot: Secondary | ICD-10-CM

## 2020-02-14 DIAGNOSIS — M2041 Other hammer toe(s) (acquired), right foot: Secondary | ICD-10-CM

## 2020-02-14 DIAGNOSIS — M79675 Pain in left toe(s): Secondary | ICD-10-CM

## 2020-02-14 DIAGNOSIS — M2042 Other hammer toe(s) (acquired), left foot: Secondary | ICD-10-CM

## 2020-02-14 DIAGNOSIS — L600 Ingrowing nail: Secondary | ICD-10-CM

## 2020-02-14 DIAGNOSIS — M79674 Pain in right toe(s): Secondary | ICD-10-CM

## 2020-02-14 DIAGNOSIS — B351 Tinea unguium: Secondary | ICD-10-CM

## 2020-02-14 MED ORDER — LOSARTAN POTASSIUM 100 MG PO TABS: 100.0000 mg | ORAL_TABLET | Freq: Every day | ORAL | 2 refills | Status: DC

## 2020-02-15 ENCOUNTER — Other Ambulatory Visit (HOSPITAL_COMMUNITY): Payer: Self-pay | Admitting: Nurse Practitioner

## 2020-02-16 ENCOUNTER — Telehealth: Payer: Self-pay | Admitting: Internal Medicine

## 2020-02-16 DIAGNOSIS — M9902 Segmental and somatic dysfunction of thoracic region: Secondary | ICD-10-CM | POA: Diagnosis not present

## 2020-02-16 DIAGNOSIS — M9903 Segmental and somatic dysfunction of lumbar region: Secondary | ICD-10-CM | POA: Diagnosis not present

## 2020-02-16 DIAGNOSIS — M546 Pain in thoracic spine: Secondary | ICD-10-CM | POA: Diagnosis not present

## 2020-02-16 DIAGNOSIS — M9905 Segmental and somatic dysfunction of pelvic region: Secondary | ICD-10-CM | POA: Diagnosis not present

## 2020-02-16 DIAGNOSIS — M542 Cervicalgia: Secondary | ICD-10-CM | POA: Diagnosis not present

## 2020-02-16 DIAGNOSIS — M48061 Spinal stenosis, lumbar region without neurogenic claudication: Secondary | ICD-10-CM | POA: Diagnosis not present

## 2020-02-16 DIAGNOSIS — M9901 Segmental and somatic dysfunction of cervical region: Secondary | ICD-10-CM | POA: Diagnosis not present

## 2020-02-16 NOTE — Telephone Encounter (Signed)
Nucala Order: °100mg #1 Vial °Order Date: 02/16/20 °Expected date of arrival: 02/19/20 °Ordered by: Rozann Holts,LPN °Specialty Pharmacy: [facility] °

## 2020-02-17 ENCOUNTER — Encounter: Payer: Self-pay | Admitting: Podiatry

## 2020-02-17 NOTE — Progress Notes (Signed)
Subjective:  Patient ID: [name] [name], female    DOB: 10-26-1940,  MRN: [id number]  [name] [name] presents to clinic today for painful thick toenails that are difficult to trim. Pain interferes with ambulation. Aggravating factors include wearing enclosed shoe gear. Pain is relieved with periodic professional debridement.  79 y.o. female presents with the above complaint.    Review of Systems: Negative except as noted in the HPI. Past Medical History:  Diagnosis Date  . Allergic rhinitis   . Anxiety   . Asthma   . Atrial flutter ([place])   . COPD (chronic obstructive pulmonary disease) ([place])   . Depression   . Essential hypertension   . Hyperlipidemia   . Lumbar disc disease   . Obesity   . OSA (obstructive sleep apnea)    CPAP  . Paroxysmal atrial fibrillation (H[facility])    Element of tachycardia bradycardia syndrome  . Respiratory failure [facility])    Past Surgical History:  Procedure Laterality Date  . ANTERIOR FUSION LUMBAR SPINE    . BIOPSY  08/27/2016   Procedure: BIOPSY;  Surgeon: Rogene Houston, MD;  Location: [facility] ENDO [facility];  Service: Endoscopy;;  FOUR GASTRIC POLYPS BIOPSIED  . CARDIOVERSION N/A 03/28/2014   Procedure: CARDIOVERSION;  Surgeon: Fay Records, MD;  Location: [facility] ORS;  Service: Cardiovascular;  Laterality: N/A;  . CARDIOVERSION N/A 10/22/2016   Procedure: CARDIOVERSION;  Surgeon: Sueanne Margarita, MD;  Location: [facility] ENDOSCOPY;  Service: Cardiovascular;  Laterality: N/A;  . CATARACT EXTRACTION    . COLONOSCOPY N/A 08/04/2012   Procedure: COLONOSCOPY;  Surgeon: Rogene Houston, MD;  Location: [facility];  Service: Endoscopy;  Laterality: N/A;  730-rescheduled to [place] notified pt  . ELECTROPHYSIOLOGIC STUDY N/A 09/18/2014   Procedure: Atrial Fibrillation Ablation;  Surgeon: Thompson Grayer, MD;  Location: [facility];  Service: Cardiovascular;  Laterality: N/A;  . ESOPHAGOGASTRODUODENOSCOPY N/A 08/27/2016   Procedure: ESOPHAGOGASTRODUODENOSCOPY (EGD);   Surgeon: Rogene Houston, MD;  Location: [facility];  Service: Endoscopy;  Laterality: N/A;  1200  . LASIK     Both eyes  . TEE WITHOUT CARDIOVERSION N/A 09/18/2014   Procedure: TRANSESOPHAGEAL ECHOCARDIOGRAM (TEE);  Surgeon: Larey Dresser, MD;  Location: [facility];  Service: Cardiovascular;  Laterality: N/A;  . TEE WITHOUT CARDIOVERSION N/A 10/22/2016   Procedure: TRANSESOPHAGEAL ECHOCARDIOGRAM (TEE);  Surgeon: Sueanne Margarita, MD;  Location: [facility] ENDOSCOPY;  Service: Cardiovascular;  Laterality: N/A;  . TEE WITHOUT CARDIOVERSION N/A 08/24/2017   Procedure: TRANSESOPHAGEAL ECHOCARDIOGRAM (TEE);  Surgeon: Josue Hector, MD;  Location: [facility] ENDOSCOPY;  Service: Cardiovascular;  Laterality: N/A;  . TOTAL HIP ARTHROPLASTY  2010   Right    Current Outpatient Medications:  .  acetaminophen (TYLENOL) 500 MG tablet, Take 500 mg by mouth daily as needed for headache., Disp: , Rfl:  .  antiseptic oral rinse (BIOTENE) LIQD, 15 mLs by Mouth Rinse route as needed for dry mouth., Disp: , Rfl:  .  atorvastatin (LIPITOR) 10 MG tablet, Take 10 mg by mouth every evening. , Disp: , Rfl:  .  azithromycin (ZITHROMAX) 250 MG tablet, Take by mouth., Disp: , Rfl:  .  benzonatate (TESSALON) 200 MG capsule, Take 1 capsule (200 mg total) by mouth 3 (three) times daily as needed for cough., Disp: 30 capsule, Rfl: 3 .  Biotin 1 MG CAPS, Take 1,000 mg by mouth 3 (three) times a week. , Disp: , Rfl:  .  budesonide (PULMICORT) 0.5 MG/2ML nebulizer solution, Take  0.5 mg by nebulization daily. , Disp: , Rfl:  .  Calcium Carbonate-Vit D-Min (QC CALCIUM-MAGNESIUM-ZINC-D3) 333.4-133 MG-UNIT TABS, Take 1 tablet by mouth 2 (two) times daily. , Disp: , Rfl:  .  cetirizine (ZYRTEC) 10 MG tablet, Take 10 mg by mouth daily as needed for allergies., Disp: , Rfl:  .  clindamycin (CLEOCIN) 300 MG capsule, 2 capsules as needed. Take prior to dental procedures, Disp: , Rfl: 0 .  clobetasol ointment (TEMOVATE) 0.05 %,  SMARTSIG:Sparingly Topical PRN, Disp: , Rfl:  .  DERMA-SMOOTHE/FS SCALP 0.01 % OIL, APPLY A SMALL AMOUNT TO SKIN THREE TIMES A WEEK, PART HAIR APPLY TO SCALP, COVER WITH PLASTIC CAP, [place] OUT IN THE MONING, Disp: , Rfl:  .  Dextromethorphan-guaiFENesin (MUCINEX DM) 30-600 MG TB12, Take 1-2 tablets by mouth as needed. , Disp: , Rfl:  .  diltiazem (CARDIZEM) 30 MG tablet, TAKE 1 TABLET BY MOUTH AS NEEDED FOR PALPITATIONS, Disp: 45 tablet, Rfl: 2 .  diltiazem (CARTIA XT) 120 MG 24 hr capsule, Take 1 capsule (120 mg total) by mouth at bedtime., Disp: 90 capsule, Rfl: 2 .  dofetilide (TIKOSYN) 500 MCG capsule, Take 1 capsule (500 mcg total) by mouth 2 (two) times daily., Disp: 180 capsule, Rfl: 2 .  doxycycline (MONODOX) 50 MG capsule, Take 50 mg by mouth daily., Disp: , Rfl:  .  doxycycline (VIBRA-TABS) 100 MG tablet, Take 100 mg by mouth daily., Disp: , Rfl:  .  famotidine (PEPCID) 20 MG tablet, Take 2 tablets (40 mg total) by mouth at bedtime as needed., Disp: 30 tablet, Rfl: 1 .  fluticasone (FLONASE) 50 MCG/ACT nasal spray, Place 1 spray into both nostrils 2 (two) times daily., Disp: , Rfl:  .  hydrALAZINE (APRESOLINE) 100 MG tablet, Take 100 mg by mouth 2 (two) times daily., Disp: , Rfl:  .  HYDROCODONE-HOMATROPINE PO, Take 5 mLs by mouth daily as needed (cough). Taking by mouth prn for cough , Disp: , Rfl:  .  ipratropium-albuterol (DUONEB) 0.5-2.5 (3) MG/3ML SOLN, Take 3 mLs by nebulization daily. , Disp: , Rfl:  .  ketoconazole (NIZORAL) 2 % cream, SMARTSIG:Sparingly Topical Twice Daily, Disp: , Rfl:  .  ketoconazole (NIZORAL) 2 % shampoo, Apply 1 application topically 3 (three) times a week. , Disp: , Rfl:  .  losartan (COZAAR) 100 MG tablet, Take 1 tablet (100 mg total) by mouth daily., Disp: 90 tablet, Rfl: 2 .  Magnesium 250 MG TABS, Take 250 mg by mouth daily., Disp: , Rfl:  .  metroNIDAZOLE (METROGEL) 1 % gel, APPLY TO FACIAL RASH TWICE DAILY, Disp: , Rfl:  .  montelukast (SINGULAIR) 10  MG tablet, Take 10 mg by mouth at bedtime., Disp: , Rfl:  .  nystatin (MYCOSTATIN) 100000 UNIT/ML suspension, TAKE ONE TEASPOONFUL (5ML) IN MOUTH OR THROAT FOUR TIMES DAILY AS DIRECTED, Disp: , Rfl:  .  pantoprazole (PROTONIX) 40 MG tablet, Take 40 mg by mouth in the morning., Disp: , Rfl:  .  potassium chloride SA (KLOR-CON) 20 MEQ tablet, Take 1 tablet (20 mEq total) by mouth daily., Disp: 90 tablet, Rfl: 2 .  predniSONE (STERAPRED UNI-PAK 21 TAB) 5 MG (21) TBPK tablet, Take by mouth as directed., Disp: , Rfl:  .  Sodium Chloride-Sodium Bicarb (AYR SALINE NASAL NETI RINSE) 1.57 g PACK, Place 1 each into the nose daily as needed (rinse nasal often)., Disp: , Rfl:  .  triamcinolone ointment (KENALOG) 0.1 %, Apply 1 application topically daily as needed for rash., Disp: ,  Rfl: 2 .  umeclidinium-vilanterol (ANORO ELLIPTA) 62.5-25 MCG/INH AEPB, Inhale 1 puff into the lungs daily., Disp: 180 each, Rfl: 3 .  warfarin (COUMADIN) 5 MG tablet, Take 5 mg by mouth as directed., Disp: , Rfl:  .  Wheat Dextrin (BENEFIBER ON THE GO) POWD, Take 1 Dose by mouth daily as needed (constipation). 2 tsp daily in the morning., Disp: , Rfl:   Current Facility-Administered Medications:  Marland Kitchen  Mepolizumab SOLR 100 mg, 100 mg, Subcutaneous, Q28 days, Young, Clinton D, MD, 100 mg at 09/07/19 0947 .  Mepolizumab SOLR 100 mg, 100 mg, Subcutaneous, Q28 days, Young, Clinton D, MD, 100 mg at 11/02/19 1123 Allergies  Allergen Reactions  . Bupropion Hcl Other (See Comments)    Suicidal Thoughts.   . Escitalopram Oxalate Other (See Comments)    Suicidal Thoughts.   . Fluticasone-Salmeterol Other (See Comments)    Advair - Caused patient to go into Afib.   Marland Kitchen Lisinopril Cough  . Serevent Other (See Comments)    Caused patient to go into Afib   Social History   Occupational History  . Occupation: Retired: Presenter, broadcasting  Tobacco Use  . Smoking status: Never Smoker  . Smokeless tobacco: Never Used  Vaping Use  . Vaping  Use: Never used  Substance and Sexual Activity  . Alcohol use: No    Alcohol/week: 0.0 standard drinks  . Drug use: No  . Sexual activity: Not on file    Objective:   Constitutional [name] [name] is a pleasant 79 y.o. Caucasian female, in NAD.Marland Kitchen AAO x 3.   Vascular Capillary refill time to digits immediate b/l. Palpable pedal pulses b/l LE. Pedal hair sparse. Lower extremity skin temperature gradient within normal limits. No pain with calf compression b/l. No edema noted b/l lower extremities.  No cyanosis or clubbing noted.  Neurologic Normal speech. Oriented to person, place, and time. Epicritic sensation to light touch grossly present bilaterally. Protective sensation intact 5/5 intact bilaterally with 10g monofilament b/l. Proprioception intact bilaterally.  Dermatologic Pedal skin with normal turgor, texture and tone bilaterally. No open wounds bilaterally. No interdigital macerations bilaterally. Toenails 1-5 right, L hallux, L 4th toe and L 5th toe elongated, discolored, dystrophic, thickened, and crumbly with subungual debris and tenderness to dorsal palpation. Nondystrophic toenails L 2nd toe and L 3rd toe. Incurvated nailplate lateral border(s) L hallux.  Nail border hypertrophy absent. There is tenderness to palpation. Sign(s) of infection: no clinical signs of infection noted on examination today..  Orthopedic: Normal muscle strength 5/5 to all lower extremity muscle groups bilaterally. No pain crepitus or joint limitation noted with ROM b/l. Hallux valgus with bunion deformity noted b/l lower extremities. Hammertoes noted to the b/l lower extremities. Pes planus deformity noted b/l.    Radiographs: None Assessment:   1. Pain due to onychomycosis of toenails of both feet   2. Ingrown toenail without infection   3. Hammertoes of both feet   4. Hallux rigidus of right foot    Plan:  Patient was evaluated and treated and all questions answered.  Onychomycosis with pain -Nails  palliatively debridement as below -Educated on self-care  Procedure: Nail Debridement Rationale: Pain Type of Debridement: manual, sharp debridement. Instrumentation: Nail nipper, rotary burr. Number of Nails: 8 -Examined patient. -Toenails 1-5 right, L hallux, L 4th toe and L 5th toe debrided in length and girth without iatrogenic bleeding with sterile nail nipper and dremel.  -Offending nail border debrided and curretaged L hallux utilizing sterile nail nipper  and currette. Border(s) cleansed with alcohol and triple antibiotic ointment applied. Patient instructed to apply Neosporin to L hallux once daily for 7 days. -Patient to report any pedal injuries to medical professional immediately. -Patient/POA to call should there be question/concern in the interim.  Return in about 9 weeks (around 04/17/2020).  Marzetta Board, DPM

## 2020-02-19 DIAGNOSIS — M9902 Segmental and somatic dysfunction of thoracic region: Secondary | ICD-10-CM | POA: Diagnosis not present

## 2020-02-19 DIAGNOSIS — M48061 Spinal stenosis, lumbar region without neurogenic claudication: Secondary | ICD-10-CM | POA: Diagnosis not present

## 2020-02-19 DIAGNOSIS — M546 Pain in thoracic spine: Secondary | ICD-10-CM | POA: Diagnosis not present

## 2020-02-19 DIAGNOSIS — M9905 Segmental and somatic dysfunction of pelvic region: Secondary | ICD-10-CM | POA: Diagnosis not present

## 2020-02-19 DIAGNOSIS — M542 Cervicalgia: Secondary | ICD-10-CM | POA: Diagnosis not present

## 2020-02-19 DIAGNOSIS — M9903 Segmental and somatic dysfunction of lumbar region: Secondary | ICD-10-CM | POA: Diagnosis not present

## 2020-02-19 DIAGNOSIS — M9901 Segmental and somatic dysfunction of cervical region: Secondary | ICD-10-CM | POA: Diagnosis not present

## 2020-02-19 NOTE — Telephone Encounter (Signed)
Nucala Shipment Received: 100mg #1 vial Medication arrival date: 02/19/20 Lot #: [id number] Exp date: 06/28/2023 Received by: Laray Rivkin,LPN 

## 2020-02-26 ENCOUNTER — Other Ambulatory Visit: Payer: Self-pay

## 2020-02-26 ENCOUNTER — Ambulatory Visit (INDEPENDENT_AMBULATORY_CARE_PROVIDER_SITE_OTHER): Payer: Medicare Other

## 2020-02-26 DIAGNOSIS — J4520 Mild intermittent asthma, uncomplicated: Secondary | ICD-10-CM

## 2020-02-26 MED ORDER — MEPOLIZUMAB 100 MG ~~LOC~~ SOLR
100.0000 mg | Freq: Once | SUBCUTANEOUS | Status: AC
  Administered 2020-02-26: 100 mg via SUBCUTANEOUS

## 2020-02-26 NOTE — Progress Notes (Signed)
Have you been hospitalized within the last 10 days?  No Do you have a fever?  No Do you have a cough?  No Do you have a headache or sore throat? No Do you have your Epi Pen visible and is it within date?  Yes 

## 2020-02-27 ENCOUNTER — Other Ambulatory Visit (HOSPITAL_COMMUNITY): Payer: Self-pay | Admitting: Nurse Practitioner

## 2020-02-27 DIAGNOSIS — I10 Essential (primary) hypertension: Secondary | ICD-10-CM | POA: Diagnosis not present

## 2020-02-28 DIAGNOSIS — M546 Pain in thoracic spine: Secondary | ICD-10-CM | POA: Diagnosis not present

## 2020-02-28 DIAGNOSIS — M9905 Segmental and somatic dysfunction of pelvic region: Secondary | ICD-10-CM | POA: Diagnosis not present

## 2020-02-28 DIAGNOSIS — M9901 Segmental and somatic dysfunction of cervical region: Secondary | ICD-10-CM | POA: Diagnosis not present

## 2020-02-28 DIAGNOSIS — M9903 Segmental and somatic dysfunction of lumbar region: Secondary | ICD-10-CM | POA: Diagnosis not present

## 2020-02-28 DIAGNOSIS — M48061 Spinal stenosis, lumbar region without neurogenic claudication: Secondary | ICD-10-CM | POA: Diagnosis not present

## 2020-02-28 DIAGNOSIS — M542 Cervicalgia: Secondary | ICD-10-CM | POA: Diagnosis not present

## 2020-02-28 DIAGNOSIS — M9902 Segmental and somatic dysfunction of thoracic region: Secondary | ICD-10-CM | POA: Diagnosis not present

## 2020-03-04 DIAGNOSIS — H04123 Dry eye syndrome of bilateral lacrimal glands: Secondary | ICD-10-CM | POA: Diagnosis not present

## 2020-03-06 NOTE — Progress Notes (Signed)
Cardiology Office Note  Date: 03/07/2020   ID: LETZY GULLICKSON, DOB Aug 18, 1940, MRN [id number]  PCP:  Monico Blitz, MD  Cardiologist:  Rozann Lesches, MD Electrophysiologist:  Thompson Grayer, MD   Chief Complaint: Persistent atrial fibrillation  History of Present Illness: [name] [name] is a 79 y.o. female with a history of persistent atrial fibrillation, tachycardia-bradycardia syndrome, COPD, HTN, OSA.  Last saw Dr. Domenic Polite 09/25/2019.  She had been recently hospitalized with palpitations and chest discomfort.  Chest discomfort occurred after dental procedure where she thought she may have swallowed some lidocaine.  HS troponin levels were not suggestive of ACS.  Patient was in sinus bradycardia by EKG without any acute ST segment changes.  Echo revealed normal LVEF.  She was continuing Coumadin.  CHA2DS2-VASc score of 4.  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  She was tolerating AV nodal blockers without dizziness or syncope.  More recently seen in atrial fibrillation clinic on 11/08/2019 f/u for Tikosyn.  She had failed flecainide and many cardioversions in the past.  She was doing well and staying in sinus rhythm on Tikosyn.  QTC was stable.  Having very few atrial fibrillation episodes.  She denied any symptoms of palpitations, chest pain, shortness of breath, orthopnea, PND, lower extremity edema, dizziness, presyncope or syncope.  Hypertension was stable.  Asthma stable on current inhalers.  At last visit patient stated she had been doing well from a cardiac standpoint.  Had occasional episodes of atrial fibrillation usually resolved with taking the short acting Cardizem. She had an episode prior which lasted approximately 6 hours.  She stated she took an extra Cardizem dose which eventually resolved the issue.  She had been walking between 6 and 10 minutes each day which seemed to be helping some with her blood pressure issues.  She brought  a log of blood pressures with her.  She stated the measurements were taken around the time she took her blood pressure medications.  The systolic numbers range anywhere from 147-197. She has chronic shortness of breath but nothing out of the ordinary.  Denies any CVA or TIA-like symptoms, PND, orthopnea, orthostatic symptoms, claudication-like symptoms, DVT or PE-like symptoms, lower extremity edema.  She was having some problems with hip pain and injured her back recently and had not been walking as much.  Presents today with complaints of elevated blood pressures and increasing dyspnea on exertion.  Brings a log of blood pressures with mostly elevated readings.  Admits to some increasing fatigue and sleeping a fair amount recently.  States she is compliant with all her antihypertensive medications.  Compliant with her CPAP.  She is tolerating her Tikosyn.  Denies any significant palpitations or arrhythmias or irregular heart rhythms.  Denies any PND or orthopnea.  No claudication or DVT-like symptoms.  No lower extremity edema.  No CVA or TIA-like symptoms.   She is here today for 1 month follow-up status post recent Lexiscan stress test which was deemed a low risk stress test.  She continues with some mild dyspnea on exertion.  She recently saw pulmonologist to attributed her DOE to include mild lung and cardiac components but obesity and hypoventilation and deconditioning were primary problems.  Plan was to walk as able and try to build endurance.  [facility]  She stated the Anoro inhaler was helping her.  She states she is  having some more issues with her atrial fibrillation and has been in touch with the atrial fibrillation clinic.  She states she feels she is going in and out of atrial fibrillation.  She continues on Tikosyn and Cardizem.  Heart rate today is 48 and regular.  She denies any anginal or exertional symptoms with  normal activities.  No orthostatic symptoms, CVA or TIA-like symptoms, PND, orthopnea, lower extremity edema.   Past Medical History:  Diagnosis Date  . Allergic rhinitis   . Anxiety   . Asthma   . Atrial flutter ([place])   . COPD (chronic obstructive pulmonary disease) ([place])   . Depression   . Essential hypertension   . Hyperlipidemia   . Lumbar disc disease   . Obesity   . OSA (obstructive sleep apnea)    CPAP  . Paroxysmal atrial fibrillation (H[facility])    Element of tachycardia bradycardia syndrome  . Respiratory failure [facility])     Past Surgical History:  Procedure Laterality Date  . ANTERIOR FUSION LUMBAR SPINE    . BIOPSY  08/27/2016   Procedure: BIOPSY;  Surgeon: Rogene Houston, MD;  Location[facility] ENDO [facility];  Service: Endoscopy;;  FOUR GASTRIC POLYPS BIOPSIED  . CARDIOVERSION N/A 03/28/2014   Procedure: CARDIOVERSION;  Surgeon: Fay Records, MD;  Location: [facility] ORS;  Service: Cardiovascular;  Laterality: N/A;  . CARDIOVERSION N/A 10/22/2016   Procedure: CARDIOVERSION;  Surgeon: Sueanne Margarita, MD;  Location: [facility] ENDOSCOPY;  Service: Cardiovascular;  Laterality: N/A;  . CATARACT EXTRACTION    . COLONOSCOPY N/A 08/04/2012   Procedure: COLONOSCOPY;  Surgeon: Rogene Houston, MD;  Location[facility];  Service: Endoscopy;  Laterality: N/A;  730-rescheduled to [place] notified pt  . ELECTROPHYSIOLOGIC STUDY N/A 09/18/2014   Procedure: Atrial Fibrillation Ablation;  Surgeon: Thompson Grayer, MD;  Location: [place] CV LAB;  Service: Cardiovascular;  Laterality: N/A;  . ESOPHAGOGASTRODUODENOSCOPY N/A 08/27/2016   Procedure: ESOPHAGOGASTRODUODENOSCOPY (EGD);  Surgeon: Rogene Houston, MD;  [facility];  Service: Endoscopy;  Laterality: N/A;  1200  . LASIK     Both eyes  . TEE WITHOUT CARDIOVERSION N/A 09/18/2014   Procedure: TRANSESOPHAGEAL ECHOCARDIOGRAM (TEE);  Surgeon: Larey Dresser, MD;  Location: [place];  Service: Cardiovascular;  Laterality: N/A;  . TEE  WITHOUT CARDIOVERSION N/A 10/22/2016   Procedure: TRANSESOPHAGEAL ECHOCARDIOGRAM (TEE);  Surgeon: Sueanne Margarita, MD;  Location: [facility] ENDOSCOPY;  Service: Cardiovascular;  Laterality: N/A;  . TEE WITHOUT CARDIOVERSION N/A 08/24/2017   Procedure: TRANSESOPHAGEAL ECHOCARDIOGRAM (TEE);  Surgeon: Josue Hector, MD;  Location: [facility] ENDOSCOPY;  Service: Cardiovascular;  Laterality: N/A;  . TOTAL HIP ARTHROPLASTY  2010   Right    Current Outpatient Medications  Medication Sig Dispense Refill  . acetaminophen (TYLENOL) 500 MG tablet Take 500 mg by mouth daily as needed for headache.    Marland Kitchen antiseptic oral rinse (BIOTENE) LIQD 15 mLs by Mouth Rinse route as needed for dry mouth.    Marland Kitchen atorvastatin (LIPITOR) 10 MG tablet Take 10 mg by mouth every evening.     . benzonatate (TESSALON) 200 MG capsule Take 1 capsule (200 mg total) by mouth 3 (three) times daily as needed for cough. 30 capsule 3  . Biotin 1 MG CAPS Take 1,000 mg by mouth 3 (three) times a week.     . budesonide (PULMICORT) 0.5 MG/2ML nebulizer solution Take 0.5 mg by nebulization daily.     . Calcium Carbonate-Vit D-Min (QC CALCIUM-MAGNESIUM-ZINC-D3) 333.4-133 MG-UNIT TABS  Take 1 tablet by mouth 2 (two) times daily.     . cetirizine (ZYRTEC) 10 MG tablet Take 10 mg by mouth daily as needed for allergies.    . clindamycin (CLEOCIN) 300 MG capsule 2 capsules as needed. Take prior to dental procedures  0  . DERMA-SMOOTHE/FS SCALP 0.01 % OIL APPLY A SMALL AMOUNT TO SKIN THREE TIMES A WEEK, PART HAIR APPLY TO SCALP, COVER WITH PLASTIC CAP, [place] OUT IN THE MONING    . Dextromethorphan-guaiFENesin (MUCINEX DM) 30-600 MG TB12 Take 1-2 tablets by mouth as needed.     . diltiazem (CARDIZEM) 30 MG tablet TAKE 1 TABLET BY MOUTH AS NEEDED FOR PALPITATIONS 45 tablet 2  . diltiazem (CARTIA XT) 120 MG 24 hr capsule Take 1 capsule (120 mg total) by mouth at bedtime. 90 capsule 2  . dofetilide (TIKOSYN) 500 MCG capsule Take 1 capsule (500 mcg total) by mouth 2  (two) times daily. 180 capsule 2  . doxycycline (MONODOX) 50 MG capsule Take 50 mg by mouth daily.    Marland Kitchen doxycycline (VIBRA-TABS) 100 MG tablet Take 100 mg by mouth daily.    . famotidine (PEPCID) 20 MG tablet Take 2 tablets (40 mg total) by mouth at bedtime as needed. 30 tablet 1  . fluticasone (FLONASE) 50 MCG/ACT nasal spray Place 1 spray into both nostrils 2 (two) times daily.    . hydrALAZINE (APRESOLINE) 100 MG tablet Take 100 mg by mouth 2 (two) times daily.    Marland Kitchen HYDROCODONE-HOMATROPINE PO Take 5 mLs by mouth daily as needed (cough). Taking by mouth prn for cough    . ipratropium-albuterol (DUONEB) 0.5-2.5 (3) MG/3ML SOLN Take 3 mLs by nebulization daily.     Marland Kitchen losartan (COZAAR) 100 MG tablet Take 1 tablet (100 mg total) by mouth daily. 90 tablet 2  . Magnesium 250 MG TABS Take 250 mg by mouth daily.    . metroNIDAZOLE (METROGEL) 1 % gel APPLY TO FACIAL RASH TWICE DAILY    . montelukast (SINGULAIR) 10 MG tablet Take 10 mg by mouth at bedtime.    Marland Kitchen nystatin (MYCOSTATIN) 100000 UNIT/ML suspension TAKE ONE TEASPOONFUL (5ML) IN MOUTH OR THROAT FOUR TIMES DAILY AS DIRECTED    . pantoprazole (PROTONIX) 40 MG tablet Take 40 mg by mouth in the morning.    . potassium chloride SA (KLOR-CON) 20 MEQ tablet Take 1 tablet (20 mEq total) by mouth daily. 90 tablet 2  . Sodium Chloride-Sodium Bicarb (AYR SALINE NASAL NETI RINSE) 1.57 g PACK Place 1 each into the nose daily as needed (rinse nasal often).    . triamcinolone ointment (KENALOG) 0.1 % Apply 1 application topically daily as needed for rash.  2  . umeclidinium-vilanterol (ANORO ELLIPTA) 62.5-25 MCG/INH AEPB Inhale 1 puff into the lungs daily. 180 each 3  . warfarin (COUMADIN) 5 MG tablet Take 5 mg by mouth as directed.    . Wheat Dextrin (BENEFIBER ON THE GO) POWD Take 1 Dose by mouth daily as needed (constipation). 2 tsp daily in the morning.    Marland Kitchen azithromycin (ZITHROMAX) 250 MG tablet Take by mouth.    . predniSONE (STERAPRED UNI-PAK 21 TAB) 5  MG (21) TBPK tablet Take by mouth as directed.     Current Facility-Administered Medications  Medication Dose Route Frequency Provider Last Rate Last Admin  . Mepolizumab SOLR 100 mg  100 mg Subcutaneous Q28 days Baird Lyons D, MD   100 mg at 09/07/19 0947  . Mepolizumab SOLR 100 mg  100  mg Subcutaneous Q28 days Baird Lyons D, MD   100 mg at 11/02/19 1123   Allergies:  Bupropion hcl, Escitalopram oxalate, Fluticasone-salmeterol, Lisinopril, and Serevent   Social History: The patient  reports that she has never smoked. She has never used smokeless tobacco. She reports that she does not drink alcohol and does not use drugs.   Family History: The patient's family history includes Cancer in her mother; Colon cancer in an other family member; Heart attack in her father.   ROS:  Please see the history of present illness. Otherwise, complete review of systems is positive for none.  All other systems are reviewed and negative.   Physical Exam: VS:  BP 124/68   Pulse (!) 48   Ht 5\' 3"  (1.6 m)   Wt 195 lb 9.6 oz (88.7 kg)   SpO2 98%   BMI 34.65 kg/m , BMI Body mass index is 34.65 kg/m.  Wt Readings from Last 3 Encounters:  03/07/20 195 lb 9.6 oz (88.7 kg)  02/08/20 199 lb 9.6 oz (90.5 kg)  01/30/20 196 lb 3.2 oz (89 kg)    General: Patient appears comfortable at rest. Neck: Supple, no elevated JVP or carotid bruits, no thyromegaly. Lungs: Clear to auscultation, nonlabored breathing at rest. Cardiac: Regular rate and rhythm, no S3 or significant systolic murmur, no pericardial rub. Extremities: No pitting edema, distal pulses 2+. Skin: Warm and dry. Musculoskeletal: No kyphosis. Neuropsychiatric: Alert and oriented x3, affect grossly appropriate.  ECG:  EKG November 08, 2019 showed sinus bradycardia rate of 53, sinus arrhythmia, LAD, minimal voltage criteria for LVH.  Recent Labwork: 09/19/2019: ALT 17; AST 29; Hemoglobin 11.0; Platelets 215 11/08/2019: BUN 17; Creatinine, Ser 1.08;  Magnesium 2.0; Potassium 4.0; Sodium 139  No results found for: CHOL, TRIG, HDL, CHOLHDL, VLDL, LDLCALC, LDLDIRECT  Other Studies Reviewed Today:  NST 02/01/2020 Study Result  Narrative & Impression   There was no ST segment deviation noted during stress.  This is a low risk study.  The left ventricular ejection fraction is normal (55-65%).  Anterior defect represents small prior infarct with mild peri-infarct ischemia vs mild differences in breast attenuation. Either finding would support low risk     Echocardiogram 09/20/2019: 1. Left ventricular ejection fraction, by estimation, is 60 to 65%. The  left ventricle has normal function. The left ventricle has no regional  wall motion abnormalities. There is moderate left ventricular hypertrophy.  Left ventricular diastolic  parameters are consistent with Grade II diastolic dysfunction  (pseudonormalization). Elevated left atrial pressure.  2. Right ventricular systolic function is normal. The right ventricular  size is normal. There is mildly elevated pulmonary artery systolic  pressure.  3. Left atrial size was severely dilated.  4. The mitral valve is normal in structure. Trivial mitral valve  regurgitation. No evidence of mitral stenosis.  5. The aortic valve is tricuspid. Aortic valve regurgitation is not  visualized. No aortic stenosis is present.  6. Indeterminant PASP, inadequate TR jet.  7. The inferior vena cava is dilated in size with >50% respiratory  variability, suggesting right atrial pressure of 8 mmHg.    Assessment and Plan:   1.  Dyspnea on exertion Still having some mild dyspnea on exertion.  Recently saw Dr. Annamaria Boots pulmonology.  She stated her Anoro inhaler was helping some.  Recent stress test was considered a low risk study.  We reviewed the results today..Recent echocardiogram in June showed EF of 60 to 65%, moderate LVH, grade 2 DD, severe left atrial  dilation, trivial mitral valve  regurgitation  2.  Fatigue States she has been noticing increasing fatigue and sleeping a lot.  She admits to being compliant with her CPAP therapy.  Recently saw pulmonology.  Pulmonologist stated there was some lung components due to asthma/COPD.  She noted improvement with Anoro inhaler.  He attributed some of her symptoms due to obesity hypoventilation syndrome.  He encouraged weight loss and exercise.   3. Persistent atrial fibrillation ([place]) States she is having more episodes of atrial fibrillation.  States she recently called the atrial fibrillation clinic regarding intermittent palpitations/atrial fibrillation.  Today heart rate is 48 and regular.  Continue diltiazem CD 120 mg daily and Cardizem 30 mg as needed for palpitations.  Continue Tikosyn 500 mcg p.o. twice daily.  Continue Coumadin 5 mg p.o. as directed  4.. BRADYCARDIA-TACHYCARDIA SYNDROME History of bradycardia tachy syndrome.  She sees Dr. Rayann Heman and Roderic Palau at atrial fibrillation clinic.   5.  Hypertension Blood pressure is well controlled at 124/68 today.  She brings with her a log which shows elevated blood pressures.  She has remote monitoring by her PCP for blood pressures also.  States she has been compliant with her antihypertensive medications.  Please add clonidine 0.1 mg p.o. twice daily to current regimen.  Continue losartan 100 mg daily, continue hydralazine 100 mg p.o. twice daily.   Medication Adjustments/Labs and Tests Ordered: Current medicines are reviewed at length with the patient today.  Concerns regarding medicines are outlined above.   Disposition: Follow-up with Dr. Domenic Polite or APP 6 months Signed, Levell July, NP 03/07/2020 8:35 AM    [facility] [place], [place], [place] [address] Phone: [phone]; Fax: [phone]

## 2020-03-07 ENCOUNTER — Encounter: Payer: Self-pay | Admitting: Family Medicine

## 2020-03-07 ENCOUNTER — Ambulatory Visit (INDEPENDENT_AMBULATORY_CARE_PROVIDER_SITE_OTHER): Payer: Medicare Other | Admitting: Family Medicine

## 2020-03-07 VITALS — BP 124/68 | HR 48 | Ht 63.0 in | Wt 195.6 lb

## 2020-03-07 DIAGNOSIS — R5383 Other fatigue: Secondary | ICD-10-CM | POA: Diagnosis not present

## 2020-03-07 DIAGNOSIS — I4819 Other persistent atrial fibrillation: Secondary | ICD-10-CM

## 2020-03-07 DIAGNOSIS — R0600 Dyspnea, unspecified: Secondary | ICD-10-CM | POA: Diagnosis not present

## 2020-03-07 DIAGNOSIS — I10 Essential (primary) hypertension: Secondary | ICD-10-CM

## 2020-03-07 DIAGNOSIS — I495 Sick sinus syndrome: Secondary | ICD-10-CM

## 2020-03-07 DIAGNOSIS — R0609 Other forms of dyspnea: Secondary | ICD-10-CM

## 2020-03-07 NOTE — Patient Instructions (Signed)
Medication Instructions:  Your physician recommends that you continue on your current medications as directed. Please refer to the Current Medication list given to you today.  *If you need a refill on your cardiac medications before your next appointment, please call your pharmacy*   Lab Work: None If you have labs (blood work) drawn today and your tests are completely normal, you will receive your results only by: Marland Kitchen MyChart Message (if you have MyChart) OR . A paper copy in the mail If you have any lab test that is abnormal or we need to change your treatment, we will call you to review the results.   Testing/Procedures: None   Follow-Up: At [facility], you and your health needs are our priority.  As part of our continuing mission to provide you with exceptional heart care, we have created designated Provider Care Teams.  These Care Teams include your primary Cardiologist (physician) and Advanced Practice Providers (APPs -  Physician Assistants and Nurse Practitioners) who all work together to provide you with the care you need, when you need it.  We recommend signing up for the patient portal called "MyChart".  Sign up information is provided on this After Visit Summary.  MyChart is used to connect with patients for Virtual Visits (Telemedicine).  Patients are able to view lab/test results, encounter notes, upcoming appointments, etc.  Non-urgent messages can be sent to your provider as well.   To learn more about what you can do with MyChart, go to [email or web address].    Your next appointment:   6 month(s)  The format for your next appointment:   In Person  Provider:   Katina Dung, NP   Other Instructions None

## 2020-03-08 DIAGNOSIS — M546 Pain in thoracic spine: Secondary | ICD-10-CM | POA: Diagnosis not present

## 2020-03-08 DIAGNOSIS — M9905 Segmental and somatic dysfunction of pelvic region: Secondary | ICD-10-CM | POA: Diagnosis not present

## 2020-03-08 DIAGNOSIS — M9901 Segmental and somatic dysfunction of cervical region: Secondary | ICD-10-CM | POA: Diagnosis not present

## 2020-03-08 DIAGNOSIS — M9902 Segmental and somatic dysfunction of thoracic region: Secondary | ICD-10-CM | POA: Diagnosis not present

## 2020-03-08 DIAGNOSIS — M542 Cervicalgia: Secondary | ICD-10-CM | POA: Diagnosis not present

## 2020-03-08 DIAGNOSIS — M9903 Segmental and somatic dysfunction of lumbar region: Secondary | ICD-10-CM | POA: Diagnosis not present

## 2020-03-08 DIAGNOSIS — M48061 Spinal stenosis, lumbar region without neurogenic claudication: Secondary | ICD-10-CM | POA: Diagnosis not present

## 2020-03-12 ENCOUNTER — Telehealth: Payer: Self-pay | Admitting: Internal Medicine

## 2020-03-12 NOTE — Telephone Encounter (Signed)
Nucala Order: 100mg #1 Vial Order Date: 03/12/20 Expected date of arrival: 03/13/20 Ordered by: Desean Heemstra,LPN Specialty Pharmacy: [facility] 

## 2020-03-14 DIAGNOSIS — Z6836 Body mass index (BMI) 36.0-36.9, adult: Secondary | ICD-10-CM | POA: Diagnosis not present

## 2020-03-14 DIAGNOSIS — I10 Essential (primary) hypertension: Secondary | ICD-10-CM | POA: Diagnosis not present

## 2020-03-14 DIAGNOSIS — I4891 Unspecified atrial fibrillation: Secondary | ICD-10-CM | POA: Diagnosis not present

## 2020-03-14 DIAGNOSIS — L719 Rosacea, unspecified: Secondary | ICD-10-CM | POA: Diagnosis not present

## 2020-03-14 DIAGNOSIS — Z299 Encounter for prophylactic measures, unspecified: Secondary | ICD-10-CM | POA: Diagnosis not present

## 2020-03-19 NOTE — Telephone Encounter (Signed)
Nucala Shipment Received: 100mg  #1 vial Medication arrival date: 03/19/20 Lot #: [id number] Exp date: 06/28/2023 Received by: Elliot Dally

## 2020-03-25 ENCOUNTER — Other Ambulatory Visit: Payer: Self-pay

## 2020-03-25 ENCOUNTER — Ambulatory Visit (INDEPENDENT_AMBULATORY_CARE_PROVIDER_SITE_OTHER): Payer: Medicare Other

## 2020-03-25 DIAGNOSIS — M48061 Spinal stenosis, lumbar region without neurogenic claudication: Secondary | ICD-10-CM | POA: Diagnosis not present

## 2020-03-25 DIAGNOSIS — J4520 Mild intermittent asthma, uncomplicated: Secondary | ICD-10-CM

## 2020-03-25 DIAGNOSIS — M9902 Segmental and somatic dysfunction of thoracic region: Secondary | ICD-10-CM | POA: Diagnosis not present

## 2020-03-25 DIAGNOSIS — M542 Cervicalgia: Secondary | ICD-10-CM | POA: Diagnosis not present

## 2020-03-25 DIAGNOSIS — M546 Pain in thoracic spine: Secondary | ICD-10-CM | POA: Diagnosis not present

## 2020-03-25 DIAGNOSIS — M9905 Segmental and somatic dysfunction of pelvic region: Secondary | ICD-10-CM | POA: Diagnosis not present

## 2020-03-25 DIAGNOSIS — M9903 Segmental and somatic dysfunction of lumbar region: Secondary | ICD-10-CM | POA: Diagnosis not present

## 2020-03-25 DIAGNOSIS — M9901 Segmental and somatic dysfunction of cervical region: Secondary | ICD-10-CM | POA: Diagnosis not present

## 2020-03-25 MED ORDER — MEPOLIZUMAB 100 MG ~~LOC~~ SOLR
100.0000 mg | Freq: Once | SUBCUTANEOUS | Status: AC
  Administered 2020-03-25: 10:00:00 100 mg via SUBCUTANEOUS

## 2020-03-25 MED ORDER — EPINEPHRINE 0.3 MG/0.3ML IJ SOAJ: 0.3000 mg | Freq: Once | INTRAMUSCULAR | 5 refills | Status: AC

## 2020-03-25 NOTE — Progress Notes (Signed)
Have you been hospitalized within the last 10 days?  No Do you have a fever?  No Do you have a cough?  No Do you have a headache or sore throat? No Do you have your Epi Pen visible and is it within date?  Yes   Patient needs new Epipen.  New Epipen prescription sent to requested [facility].

## 2020-03-27 DIAGNOSIS — M9901 Segmental and somatic dysfunction of cervical region: Secondary | ICD-10-CM | POA: Diagnosis not present

## 2020-03-27 DIAGNOSIS — M542 Cervicalgia: Secondary | ICD-10-CM | POA: Diagnosis not present

## 2020-03-27 DIAGNOSIS — M48061 Spinal stenosis, lumbar region without neurogenic claudication: Secondary | ICD-10-CM | POA: Diagnosis not present

## 2020-03-27 DIAGNOSIS — M546 Pain in thoracic spine: Secondary | ICD-10-CM | POA: Diagnosis not present

## 2020-03-27 DIAGNOSIS — M9903 Segmental and somatic dysfunction of lumbar region: Secondary | ICD-10-CM | POA: Diagnosis not present

## 2020-03-27 DIAGNOSIS — M9902 Segmental and somatic dysfunction of thoracic region: Secondary | ICD-10-CM | POA: Diagnosis not present

## 2020-03-27 DIAGNOSIS — M9905 Segmental and somatic dysfunction of pelvic region: Secondary | ICD-10-CM | POA: Diagnosis not present

## 2020-03-28 DIAGNOSIS — I10 Essential (primary) hypertension: Secondary | ICD-10-CM | POA: Diagnosis not present

## 2020-04-09 ENCOUNTER — Other Ambulatory Visit: Payer: Self-pay | Admitting: Internal Medicine

## 2020-04-09 ENCOUNTER — Telehealth: Payer: Self-pay | Admitting: Internal Medicine

## 2020-04-09 NOTE — Telephone Encounter (Signed)
Called and spoke with patient who states that she is doing patient assistance paperwork now but is in the process of meeting her $600 deductible. Wanted to make sure that getting the first one filled to go towards her deductible won't interfere. Advised patient that it should not mess up anything. She expressed understanding. Advised her that they would need a copy of her print out from the pharmacy stating that she has spent the $600 and that it has to be signed by pharmacist. Nothing further needed at this time.

## 2020-04-10 DIAGNOSIS — J449 Chronic obstructive pulmonary disease, unspecified: Secondary | ICD-10-CM | POA: Diagnosis not present

## 2020-04-10 DIAGNOSIS — I4891 Unspecified atrial fibrillation: Secondary | ICD-10-CM | POA: Diagnosis not present

## 2020-04-10 DIAGNOSIS — I10 Essential (primary) hypertension: Secondary | ICD-10-CM | POA: Diagnosis not present

## 2020-04-10 DIAGNOSIS — Z6834 Body mass index (BMI) 34.0-34.9, adult: Secondary | ICD-10-CM | POA: Diagnosis not present

## 2020-04-10 DIAGNOSIS — Z789 Other specified health status: Secondary | ICD-10-CM | POA: Diagnosis not present

## 2020-04-10 DIAGNOSIS — M7062 Trochanteric bursitis, left hip: Secondary | ICD-10-CM | POA: Diagnosis not present

## 2020-04-10 DIAGNOSIS — Z299 Encounter for prophylactic measures, unspecified: Secondary | ICD-10-CM | POA: Diagnosis not present

## 2020-04-10 DIAGNOSIS — M87059 Idiopathic aseptic necrosis of unspecified femur: Secondary | ICD-10-CM | POA: Diagnosis not present

## 2020-04-11 ENCOUNTER — Telehealth: Payer: Self-pay | Admitting: Internal Medicine

## 2020-04-11 NOTE — Telephone Encounter (Signed)
Nucala Order: 100mg  #1 Vial Order Date: 04/11/20 Expected date of arrival: 04/12/20 Ordered by: [place]: Nigel Mormon

## 2020-04-12 NOTE — Telephone Encounter (Signed)
Nucala Shipment Received: 100mg  #1 vial Medication arrival date: 04/12/20 Lot #: [id number] Exp date: 06/28/2023 Received by: Elliot Dally

## 2020-04-17 ENCOUNTER — Ambulatory Visit: Payer: Medicare Other | Admitting: Podiatry

## 2020-04-22 ENCOUNTER — Other Ambulatory Visit: Payer: Self-pay

## 2020-04-22 ENCOUNTER — Ambulatory Visit (INDEPENDENT_AMBULATORY_CARE_PROVIDER_SITE_OTHER): Payer: Medicare Other

## 2020-04-22 DIAGNOSIS — J4520 Mild intermittent asthma, uncomplicated: Secondary | ICD-10-CM | POA: Diagnosis not present

## 2020-04-22 MED ORDER — MEPOLIZUMAB 100 MG ~~LOC~~ SOLR
100.0000 mg | Freq: Once | SUBCUTANEOUS | Status: AC
  Administered 2020-04-22: 100 mg via SUBCUTANEOUS

## 2020-04-22 NOTE — Progress Notes (Signed)
Have you been hospitalized within the last 10 days?  No Do you have a fever?  No Do you have a cough?  No Do you have a headache or sore throat? No Do you have your Epi Pen visible and is it within date?  Yes 

## 2020-04-24 ENCOUNTER — Telehealth: Payer: Self-pay | Admitting: Cardiology

## 2020-04-24 NOTE — Telephone Encounter (Signed)
Patient called stating that Dr. Manuella Ghazi is monitoring her BP and HR. She received a call from Dr. Trena Platt office today stating that her HR is staying low. She was advised to contact Dr. Domenic Polite . Please call patients cell # [phone].

## 2020-04-24 NOTE — Telephone Encounter (Signed)
Pt voiced understanding

## 2020-04-24 NOTE — Telephone Encounter (Signed)
Noted.  Thank you for the update.  She is typically bradycardic when she is in sinus rhythm, this is not new.  If she is not symptomatic, would make no changes at this time.

## 2020-04-24 NOTE — Telephone Encounter (Signed)
HR readings since 04/13/20 - 45 43 56 49 48 46 55 50 46 53  51 48 51 67 41  BP has been 140s-150s/70s-80s  Pt denies any symptoms - Dr Manuella Ghazi monitors BP/HR and was told to contact us regarding low HRs

## 2020-04-29 DIAGNOSIS — I10 Essential (primary) hypertension: Secondary | ICD-10-CM | POA: Diagnosis not present

## 2020-05-07 ENCOUNTER — Encounter (INDEPENDENT_AMBULATORY_CARE_PROVIDER_SITE_OTHER): Payer: Self-pay | Admitting: Internal Medicine

## 2020-05-07 ENCOUNTER — Other Ambulatory Visit: Payer: Self-pay

## 2020-05-07 ENCOUNTER — Telehealth (INDEPENDENT_AMBULATORY_CARE_PROVIDER_SITE_OTHER): Payer: Medicare Other | Admitting: Internal Medicine

## 2020-05-07 VITALS — BP 157/74 | HR 44 | Ht 62.0 in | Wt 188.0 lb

## 2020-05-07 DIAGNOSIS — R0789 Other chest pain: Secondary | ICD-10-CM

## 2020-05-07 DIAGNOSIS — K219 Gastro-esophageal reflux disease without esophagitis: Secondary | ICD-10-CM | POA: Diagnosis not present

## 2020-05-07 NOTE — Progress Notes (Signed)
Virtual Visit via Telephone Note  I connected with [name] [name] on 05/07/20 at 01:34 PM EST by telephone and verified that I am speaking with the correct person using two identifiers.  Location: Patient: home Provider: office   I discussed the limitations, risks, security and privacy concerns of performing an evaluation and management service by telephone and the availability of in person appointments. I also discussed with the patient that there may be a patient responsible charge related to this service. The patient expressed understanding and agreed to proceed.   Current Outpatient Medications:  .  acetaminophen (TYLENOL) 500 MG tablet, Take 500 mg by mouth daily as needed for headache., Disp: , Rfl:  .  ANORO ELLIPTA 62.5-25 MCG/INH AEPB, INHALE ONE PUFF INTO THE LUNGS DAILY, Disp: 60 each, Rfl: 5 .  antiseptic oral rinse (BIOTENE) LIQD, 15 mLs by Mouth Rinse route as needed for dry mouth., Disp: , Rfl:  .  atorvastatin (LIPITOR) 10 MG tablet, Take 10 mg by mouth every evening. , Disp: , Rfl:  .  benzonatate (TESSALON) 200 MG capsule, Take 1 capsule (200 mg total) by mouth 3 (three) times daily as needed for cough., Disp: 30 capsule, Rfl: 3 .  Biotin 1 MG CAPS, Take 1,000 mg by mouth 3 (three) times a week. , Disp: , Rfl:  .  budesonide (PULMICORT) 0.5 MG/2ML nebulizer solution, Take 0.5 mg by nebulization daily. , Disp: , Rfl:  .  Calcium Carbonate-Vit D-Min (QC CALCIUM-MAGNESIUM-ZINC-D3) 333.4-133 MG-UNIT TABS, Take 1 tablet by mouth 2 (two) times daily. , Disp: , Rfl:  .  cetirizine (ZYRTEC) 10 MG tablet, Take 10 mg by mouth daily as needed for allergies., Disp: , Rfl:  .  clindamycin (CLEOCIN) 300 MG capsule, 2 capsules as needed. Take prior to dental procedures, Disp: , Rfl: 0 .  DERMA-SMOOTHE/FS SCALP 0.01 % OIL, APPLY A SMALL AMOUNT TO SKIN THREE TIMES A WEEK, PART HAIR APPLY TO SCALP, COVER WITH PLASTIC CAP, [place] OUT IN THE MONING, Disp: , Rfl:  .   Dextromethorphan-guaiFENesin (MUCINEX DM) 30-600 MG TB12, Take 1-2 tablets by mouth as needed. , Disp: , Rfl:  .  diltiazem (CARDIZEM) 30 MG tablet, TAKE 1 TABLET BY MOUTH AS NEEDED FOR PALPITATIONS, Disp: 45 tablet, Rfl: 2 .  diltiazem (CARTIA XT) 120 MG 24 hr capsule, Take 1 capsule (120 mg total) by mouth at bedtime., Disp: 90 capsule, Rfl: 2 .  dofetilide (TIKOSYN) 500 MCG capsule, Take 1 capsule (500 mcg total) by mouth 2 (two) times daily., Disp: 180 capsule, Rfl: 2 .  famotidine (PEPCID) 20 MG tablet, Take 2 tablets (40 mg total) by mouth at bedtime as needed., Disp: 30 tablet, Rfl: 1 .  fluticasone (FLONASE) 50 MCG/ACT nasal spray, Place 1 spray into both nostrils 2 (two) times daily., Disp: , Rfl:  .  hydrALAZINE (APRESOLINE) 100 MG tablet, Take 100 mg by mouth 2 (two) times daily., Disp: , Rfl:  .  HYDROCODONE-HOMATROPINE PO, Take 5 mLs by mouth daily as needed (cough). Taking by mouth prn for cough, Disp: , Rfl:  .  ipratropium-albuterol (DUONEB) 0.5-2.5 (3) MG/3ML SOLN, Take 3 mLs by nebulization daily. , Disp: , Rfl:  .  losartan (COZAAR) 100 MG tablet, Take 1 tablet (100 mg total) by mouth daily., Disp: 90 tablet, Rfl: 2 .  Magnesium 400 MG TABS, Take 400 mg by mouth daily., Disp: , Rfl:  .  montelukast (SINGULAIR) 10 MG tablet, Take 10 mg by mouth at bedtime., Disp: , Rfl:  .  pantoprazole (PROTONIX) 40 MG tablet, Take 40 mg by mouth in the morning., Disp: , Rfl:  .  potassium chloride SA (KLOR-CON) 20 MEQ tablet, Take 1 tablet (20 mEq total) by mouth daily., Disp: 90 tablet, Rfl: 2 .  Sodium Chloride-Sodium Bicarb (AYR SALINE NASAL NETI RINSE) 1.57 g PACK, Place 1 each into the nose daily as needed (rinse nasal often)., Disp: , Rfl:  .  triamcinolone ointment (KENALOG) 0.1 %, Apply 1 application topically daily as needed for rash., Disp: , Rfl: 2 .  warfarin (COUMADIN) 5 MG tablet, Take 5 mg by mouth as directed., Disp: , Rfl:  .  Wheat Dextrin (BENEFIBER ON THE GO) POWD, Take 1 Dose  by mouth daily as needed (constipation). 2 tsp daily in the morning., Disp: , Rfl:  .  doxycycline (MONODOX) 50 MG capsule, Take 50 mg by mouth daily., Disp: , Rfl:  .  metroNIDAZOLE (METROGEL) 1 % gel, APPLY TO FACIAL RASH TWICE DAILY (Patient not taking: Reported on 05/07/2020), Disp: , Rfl:     History of Present Illness:  Last visit was on 05/02/2019. Patient states she is doing well as far as heartburn is concerned which is not very often and she is to use Tums instead of taking famotidine.  She usually has heartburn if she eats raw fruit.  She is not having dysphagia hoarseness or sore throat she has dry cough rarely which she believes is due to COPD and she uses [employer]  She has good appetite.  Her weight has been stable.  Patient states she had dental work back in June 2021 and she developed chest pain and rapid heart rate.  She had 1 more episode once again after having dental work/filling.  This time she called EMS.  She was evaluated and told that she had the symptoms to reflux but she does not remember having heartburn or regurgitation.  She has not followed up with her cardiologist.  She needs more dental work but she is afraid to go back.   Observations/Objective:  Patient reported her weight to be 188 lbs Patient weighed 189 lbs on 05/02/2019  Assessment and Plan: #1 GERD.  Typical symptoms are well controlled with therapy.  #2. Atypical chest pain following dental procedure twice.  It is difficult to be sure that her chest pain was due to GERD.  She has never had this before.  I would recommend that she change the timing of her next visit with dentist.  She should consider doing this late morning or early afternoon.  She should consider taking 2 Tums before she walks in the dentist office.  She should also ask her dentist to keep her in propped up position if possible.  If she has another episode I would definitely recommend evaluation by cardiologist.   Follow Up  Instructions:   Office visit in 1 year.   I discussed the assessment and treatment plan with the patient. The patient was provided an opportunity to ask questions and all were answered. The patient agreed with the plan and demonstrated an understanding of the instructions.   The patient was advised to call back or seek an in-person evaluation if the symptoms worsen or if the condition fails to improve as anticipated.  I provided 11 minutes of non-face-to-face time during this encounter.   Hildred Laser, MD

## 2020-05-08 DIAGNOSIS — I10 Essential (primary) hypertension: Secondary | ICD-10-CM | POA: Diagnosis not present

## 2020-05-08 DIAGNOSIS — J019 Acute sinusitis, unspecified: Secondary | ICD-10-CM | POA: Diagnosis not present

## 2020-05-08 DIAGNOSIS — Z6834 Body mass index (BMI) 34.0-34.9, adult: Secondary | ICD-10-CM | POA: Diagnosis not present

## 2020-05-08 DIAGNOSIS — Z299 Encounter for prophylactic measures, unspecified: Secondary | ICD-10-CM | POA: Diagnosis not present

## 2020-05-08 DIAGNOSIS — I4891 Unspecified atrial fibrillation: Secondary | ICD-10-CM | POA: Diagnosis not present

## 2020-05-09 ENCOUNTER — Ambulatory Visit (HOSPITAL_COMMUNITY)
Admission: RE | Admit: 2020-05-09 | Discharge: 2020-05-09 | Disposition: A | Discharge status: Home / Self Care | Payer: Medicare Other | Source: Ambulatory Visit | Attending: Nurse Practitioner | Admitting: Nurse Practitioner

## 2020-05-09 ENCOUNTER — Other Ambulatory Visit: Payer: Self-pay

## 2020-05-09 ENCOUNTER — Encounter (HOSPITAL_COMMUNITY): Payer: Self-pay | Admitting: Nurse Practitioner

## 2020-05-09 VITALS — BP 144/50 | HR 47 | Ht 62.0 in | Wt 192.8 lb

## 2020-05-09 DIAGNOSIS — I4819 Other persistent atrial fibrillation: Secondary | ICD-10-CM | POA: Insufficient documentation

## 2020-05-09 DIAGNOSIS — I4892 Unspecified atrial flutter: Secondary | ICD-10-CM | POA: Diagnosis not present

## 2020-05-09 DIAGNOSIS — Z7901 Long term (current) use of anticoagulants: Secondary | ICD-10-CM | POA: Diagnosis not present

## 2020-05-09 DIAGNOSIS — Z8249 Family history of ischemic heart disease and other diseases of the circulatory system: Secondary | ICD-10-CM | POA: Diagnosis not present

## 2020-05-09 DIAGNOSIS — R001 Bradycardia, unspecified: Secondary | ICD-10-CM | POA: Insufficient documentation

## 2020-05-09 DIAGNOSIS — G4733 Obstructive sleep apnea (adult) (pediatric): Secondary | ICD-10-CM | POA: Diagnosis not present

## 2020-05-09 DIAGNOSIS — J45909 Unspecified asthma, uncomplicated: Secondary | ICD-10-CM | POA: Insufficient documentation

## 2020-05-09 DIAGNOSIS — J449 Chronic obstructive pulmonary disease, unspecified: Secondary | ICD-10-CM | POA: Insufficient documentation

## 2020-05-09 DIAGNOSIS — Z79899 Other long term (current) drug therapy: Secondary | ICD-10-CM | POA: Diagnosis not present

## 2020-05-09 DIAGNOSIS — I10 Essential (primary) hypertension: Secondary | ICD-10-CM | POA: Diagnosis not present

## 2020-05-09 DIAGNOSIS — I495 Sick sinus syndrome: Secondary | ICD-10-CM | POA: Diagnosis not present

## 2020-05-09 DIAGNOSIS — D6869 Other thrombophilia: Secondary | ICD-10-CM

## 2020-05-09 DIAGNOSIS — Z888 Allergy status to other drugs, medicaments and biological substances status: Secondary | ICD-10-CM | POA: Insufficient documentation

## 2020-05-09 LAB — BASIC METABOLIC PANEL
Anion gap: 9 (ref 5–15)
BUN: 21 mg/dL (ref 8–23)
CO2: 21 mmol/L — ABNORMAL LOW (ref 22–32)
Calcium: 8.8 mg/dL — ABNORMAL LOW (ref 8.9–10.3)
Chloride: 109 mmol/L (ref 98–111)
Creatinine, Ser: 0.98 mg/dL (ref 0.44–1.00)
GFR, Estimated: 58 mL/min — ABNORMAL LOW (ref >60)
Glucose, Bld: 92 mg/dL (ref 70–99)
Potassium: 4.1 mmol/L (ref 3.5–5.1)
Sodium: 139 mmol/L (ref 135–145)

## 2020-05-09 LAB — MAGNESIUM: Magnesium: 2.3 mg/dL (ref 1.7–2.4)

## 2020-05-09 NOTE — Progress Notes (Signed)
Primary Care Physician: Monico Blitz, MD Referring Physician: Dr. [name] is a 80 y.o. female with a h/o COPD, HTN, OSA, atrial flutter and paroxysmal afib tht has been persistent for several weeks. She was seen  the afib clinic for admission for Tikosyn. She failed flecainide, many cardioversion's in the past.  She is no longer on HCTZ.  F/u in afib clinic one week after Tikosyn loading. She is Sinus brady today at HR of 49 bpm, but at home HR ins in the 50's  Which is her norm. She feels improved on tikosyn being  in rhythm.  F/u in afib clinic 8/21. She had a few off days lat week and wondered if she was in afib. The only change in her health is that she started Trilogy inhaler for her asthma and had to be treated for thrush, She has now stopped inhaler. She is in rhythm today. She has started Pulmonology rehab and is pleased that she is having more energy and is able to do activities that she was able to do before.  F/u in afib clinic 7/27 for Tikosyn surveillance. Her Ekg shows SR with qtc per EKG read out at 532 ms. I asked Dr. Rayann Heman to look at EKG and he feels qtc is closer to  500 ms and does not recommend any change int Tikosyn dose. She has had some recent afib, one night as her power went out and she got hot. She currently has has a UTI and is on Macrodantin . This as well may have stirred up some afib recently.  F/u afib clinic, 11/19, pt is in for tikosyn surveillance. Feels some afib, short term, but not like she had before Tikosyn, which would usually require cardioversion.  Feels he has been doing very well with  her lungs being more stable as well. HR 49 bpm in the office in SR but had home HR's in the 50's/60's.   F/u in afib clinic 08/08/19 for tikosyn surveillance. She  has very few breakthrough afib episodes. Using cardizem 30 mg as needed, drug will usually get her back in rhythm within 3-4 hours. Her qtc is stable today. Had recent cbc/bmet with her PCP,  mag to be drawn today.continue dofetilide 500 mg bid and warfarin for a CHA2DS2VASc score of 4. HEr heart rate is usually slow in the 50's, asymptomatic. At home readings reviewed and pulse ranges 50-70's. PCP is having her send in remote BP readings daily.   F/u in afib clinic, 11/08/19, for tikosyn surveillance . She is doing well staying in [place]. Qtc is stable. Has had her covid shots. Very few afib episodes.  F/u in afib clinic, 2/10 /22. Marland Kitchen Pt is in SR but has noted increase in  afib burden recently. She has also noted heart rates consistently in the 40's. She  feels this may be causing her to feel sluggish/fatigued. She continues on Tikosyn with 120 mg Cardizem daily. She has had some sinus issues and was started on a zpak by his PCP. This is contraindicated with tikosyn and she was advised to stop drug  and contact PCP for another antibiotic.   Today, she denies symptoms of palpitations, chest pain, shortness of breath, orthopnea, PND, lower extremity edema, dizziness, presyncope, syncope, or neurologic sequela. The patient is tolerating medications without difficulties and is otherwise without complaint today.   Past Medical History:  Diagnosis Date  . Allergic rhinitis   . Anxiety   . Asthma   .  Atrial flutter ([place])   . COPD (chronic obstructive pulmonary disease) ([place])   . Depression   . Essential hypertension   . Hyperlipidemia   . Lumbar disc disease   . Obesity   . OSA (obstructive sleep apnea)    CPAP  . Paroxysmal atrial fibrillation ([facility])    Element of tachycardia bradycardia syndrome  . Respiratory failure [facility])    Past Surgical History:  Procedure Laterality Date  . ANTERIOR FUSION LUMBAR SPINE    . BIOPSY  08/27/2016   Procedure: BIOPSY;  Surgeon: Rogene Houston, MD;  Location: [facility] ENDO SUITE;  Service: Endoscopy;;  FOUR GASTRIC POLYPS BIOPSIED  . CARDIOVERSION N/A 03/28/2014   Procedure: CARDIOVERSION;  Surgeon: Fay Records, MD;  Location: [facility] ORS;  Service:  Cardiovascular;  Laterality: N/A;  . CARDIOVERSION N/A 10/22/2016   Procedure: CARDIOVERSION;  Surgeon: Sueanne Margarita, MD;  Location: [facility] ENDOSCOPY;  Service: Cardiovascular;  Laterality: N/A;  . CATARACT EXTRACTION    . COLONOSCOPY N/A 08/04/2012   Procedure: COLONOSCOPY;  Surgeon: Rogene Houston, MD;  Location: [facility]O [facility];  Service: Endoscopy;  Laterality: N/A;  730-rescheduled to [place] notified pt  . ELECTROPHYSIOLOGIC STUDY N/A 09/18/2014   Procedure: Atrial Fibrillation Ablation;  Surgeon: Thompson Grayer, MD;  Location: [place] CV LAB;  Service: Cardiovascular;  Laterality: N/A;  . ESOPHAGOGASTRODUODENOSCOPY N/A 08/27/2016   Procedure: ESOPHAGOGASTRODUODENOSCOPY (EGD);  Surgeon: Rogene Houston, MD;  [facility];  Service: Endoscopy;  Laterality: N/A;  1200  . LASIK     Both eyes  . TEE WITHOUT CARDIOVERSION N/A 09/18/2014   Procedure: TRANSESOPHAGEAL ECHOCARDIOGRAM (TEE);  Surgeon: Larey Dresser, MD;  Location: [place];  Service: Cardiovascular;  Laterality: N/A;  . TEE WITHOUT CARDIOVERSION N/A 10/22/2016   Procedure: TRANSESOPHAGEAL ECHOCARDIOGRAM (TEE);  Surgeon: Sueanne Margarita, MD;  Location: [facility] ENDOSCOPY;  Service: Cardiovascular;  Laterality: N/A;  . TEE WITHOUT CARDIOVERSION N/A 08/24/2017   Procedure: TRANSESOPHAGEAL ECHOCARDIOGRAM (TEE);  Surgeon: Josue Hector, MD;  [facility] END[facility]COPY;  Service: Cardiovascular;  Laterality: N/A;  . TOTAL HIP ARTHROPLASTY  2010   Right    Current Outpatient Medications  Medication Sig Dispense Refill  . acetaminophen (TYLENOL) 500 MG tablet Take 500 mg by mouth daily as needed for headache.    Jearl Klinefelter ELLIPTA 62.5-25 MCG/INH AEPB INHALE ONE PUFF INTO THE LUNGS DAILY 60 each 5  . antiseptic oral rinse (BIOTENE) LIQD 15 mLs by Mouth Rinse route as needed for dry mouth.    Marland Kitchen atorvastatin (LIPITOR) 10 MG tablet Take 10 mg by mouth every evening.     Marland Kitchen azithromycin (ZITHROMAX) 250 MG tablet TAKE 2 TABLETS BY MOUTH ON  DAY 1, THEN TAKE 1 TABLET DAILY ON DAYS 2-5    . benzonatate (TESSALON) 200 MG capsule Take 1 capsule (200 mg total) by mouth 3 (three) times daily as needed for cough. 30 capsule 3  . Biotin 1 MG CAPS Take 1,000 mg by mouth 3 (three) times a week.     . budesonide (PULMICORT) 0.5 MG/2ML nebulizer solution Take 0.5 mg by nebulization daily.     . Calcium Carbonate-Vit D-Min (QC CALCIUM-MAGNESIUM-ZINC-D3) 333.4-133 MG-UNIT TABS Take 1 tablet by mouth 2 (two) times daily.     . cetirizine (ZYRTEC) 10 MG tablet Take 10 mg by mouth daily as needed for allergies.    . clindamycin (CLEOCIN) 300 MG capsule 2 capsules as needed. Take prior to dental procedures  0  . DERMA-SMOOTHE/FS SCALP  0.01 % OIL APPLY A SMALL AMOUNT TO SKIN THREE TIMES A WEEK, PART HAIR APPLY TO SCALP, COVER WITH PLASTIC CAP, [place] OUT IN THE MONING    . diltiazem (CARDIZEM) 30 MG tablet TAKE 1 TABLET BY MOUTH AS NEEDED FOR PALPITATIONS 45 tablet 2  . diltiazem (CARTIA XT) 120 MG 24 hr capsule Take 1 capsule (120 mg total) by mouth at bedtime. 90 capsule 2  . dofetilide (TIKOSYN) 500 MCG capsule Take 1 capsule (500 mcg total) by mouth 2 (two) times daily. 180 capsule 2  . EPINEPHrine 0.3 mg/0.3 mL IJ SOAJ injection Inject into the muscle.    . famotidine (PEPCID) 20 MG tablet Take 2 tablets (40 mg total) by mouth at bedtime as needed. 30 tablet 1  . fluticasone (FLONASE) 50 MCG/ACT nasal spray Place 1 spray into both nostrils 2 (two) times daily.    . hydrALAZINE (APRESOLINE) 100 MG tablet Take 100 mg by mouth 2 (two) times daily.    Marland Kitchen ipratropium-albuterol (DUONEB) 0.5-2.5 (3) MG/3ML SOLN Take 3 mLs by nebulization daily.     Marland Kitchen ketoconazole (NIZORAL) 2 % shampoo Apply topically.    Marland Kitchen losartan (COZAAR) 100 MG tablet Take 1 tablet (100 mg total) by mouth daily. 90 tablet 2  . Magnesium 400 MG TABS Take 400 mg by mouth daily.    . metroNIDAZOLE (METROGEL) 1 % gel     . montelukast (SINGULAIR) 10 MG tablet Take 10 mg by mouth at bedtime.     . pantoprazole (PROTONIX) 40 MG tablet Take 40 mg by mouth in the morning.    . potassium chloride SA (KLOR-CON) 20 MEQ tablet Take 1 tablet (20 mEq total) by mouth daily. 90 tablet 2  . Sodium Chloride-Sodium Bicarb (AYR SALINE NASAL NETI RINSE) 1.57 g PACK Place 1 each into the nose daily as needed (rinse nasal often).    . triamcinolone ointment (KENALOG) 0.1 % Apply 1 application topically daily as needed for rash.  2  . warfarin (COUMADIN) 5 MG tablet Take 5 mg by mouth as directed.    . Wheat Dextrin (BENEFIBER ON THE GO) POWD Take 1 Dose by mouth daily as needed (constipation). 2 tsp daily in the morning.     Current Facility-Administered Medications  Medication Dose Route Frequency Provider Last Rate Last Admin  . Mepolizumab SOLR 100 mg  100 mg Subcutaneous Q28 days Baird Lyons D, MD   100 mg at 09/07/19 0947  . Mepolizumab SOLR 100 mg  100 mg Subcutaneous Q28 days Baird Lyons D, MD   100 mg at 11/02/19 1123    Allergies  Allergen Reactions  . Bupropion Hcl Other (See Comments)    Suicidal Thoughts.   . Escitalopram Oxalate Other (See Comments)    Suicidal Thoughts.   . Fluticasone-Salmeterol Other (See Comments)    Advair - Caused patient to go into Afib.   Marland Kitchen Lisinopril Cough  . Serevent Other (See Comments)    Caused patient to go into Afib    Social History   Socioeconomic History  . Marital status: Single    Spouse name: Not on file  . Number of children: Not on file  . Years of education: Not on file  . Highest education level: Not on file  Occupational History  . Occupation: Retired: Presenter, broadcasting  Tobacco Use  . Smoking status: Never Smoker  . Smokeless tobacco: Never Used  Vaping Use  . Vaping Use: Never used  Substance and Sexual Activity  . Alcohol use:  No    Alcohol/week: 0.0 standard drinks  . Drug use: No  . Sexual activity: Not on file  Other Topics Concern  . Not on file  Social History Narrative   Pt lives in [place] alone. She  was never married.   Retired Customer service manager.   Attends [employer]   Social Determinants of Health   Financial Resource Strain: Not on [employer] Insecurity: Not on file  Transportation Needs: Not on file  Physical Activity: Not on file  Stress: Not on file  Social Connections: Not on file  Intimate Partner Violence: Not on file    Family History  Problem Relation Age of Onset  . Cancer Mother   . Heart attack Father   . Colon cancer Other     ROS- All systems are reviewed and negative except as per the HPI above  Physical Exam: Vitals:   05/09/20 1132  BP: (!) 144/50  Pulse: (!) 47  Weight: 87.5 kg  Height: 5\' 2"  (1.575 m)   Wt Readings from Last 3 Encounters:  05/09/20 87.5 kg  05/07/20 85.3 kg  03/07/20 88.7 kg    Labs: Lab Results  Component Value Date   NA 139 05/09/2020   K 4.1 05/09/2020   CL 109 05/09/2020   CO2 21 (L) 05/09/2020   GLUCOSE 92 05/09/2020   BUN 21 05/09/2020   CREATININE 0.98 05/09/2020   CALCIUM 8.8 (L) 05/09/2020   MG 2.3 05/09/2020   Lab Results  Component Value Date   INR 2.0 (H) 09/19/2019   No results found for: CHOL, HDL, LDLCALC, TRIG   GEN- The patient is well appearing, alert and oriented x 3 today.   Head- normocephalic, atraumatic Eyes-  Sclera clear, conjunctiva pink Ears- hearing intact Oropharynx- clear Neck- supple, no JVP Lymph- no cervical lymphadenopathy Lungs- Clear to ausculation bilaterally, normal work of breathing Heart-slow, regular rate and rhythm, no murmurs, rubs or gallops, PMI not laterally displaced GI- soft, NT, ND, + BS Extremities- no clubbing, cyanosis, or edema MS- no significant deformity or atrophy Skin- no rash or lesion Psych- euthymic mood, full affect Neuro- strength and sensation are intact  EKG-  Sinus brady at 47 bpm,  Pr int 224 ms, qrs int 98 ms,  qtc 463 ms (stable)      Assessment and Plan: 1. Persistent  afib Has failed flecainide Now with increase  burden on tikosyn  Cannot increase daily rate control for symptomatic bradycardia at baseline  Continue Tikosyn at 500 mcg bid ( qt  stable) Continue warfarin for chadsvasc score of 4 Continue Cartia XT 120 mg daily amd 30 mg as needed Mag, bmet today   2. Symptomatic bradycardia  Will send back to Dr. Rayann Heman to consider for PPM with tachy/brady and fatigue with HR in the 40's   3. HTN Stable here   3. Asthma Stable per pt on current inhalers/drugs   Appointment requested with Dr. Lawrence Marseilles C. Ivanna Kocak, [facility] [address] [place], [place] [address] [phone]

## 2020-05-16 ENCOUNTER — Telehealth (HOSPITAL_COMMUNITY): Payer: Self-pay | Admitting: *Deleted

## 2020-05-16 NOTE — Telephone Encounter (Signed)
Patient called stating for the last several days her Heart rates have been in the 90-120 range. She is taking PRN cardizem it does lower HR until next dose is due. More short of breath but tolerating ok.  She is finishing up an antibiotic for sinus infection today not sure it is completely gone she states. Pt has upcoming appt with Dr. Rayann Heman to discuss tachy/brady -- she will continue to use PRN cardizem for heart rate control and contact her PCP if she feels over the next few days her sinus symptoms are not improving. She will contact me next week to let me know how her HRs are doing. Pt in agreement.

## 2020-05-20 ENCOUNTER — Ambulatory Visit: Payer: Medicare Other

## 2020-05-20 ENCOUNTER — Telehealth: Payer: Self-pay | Admitting: Internal Medicine

## 2020-05-20 NOTE — Telephone Encounter (Signed)
Spoke with Patient. Patient scheduled 05/21/20 at 4pm.

## 2020-05-21 ENCOUNTER — Other Ambulatory Visit: Payer: Self-pay

## 2020-05-21 ENCOUNTER — Ambulatory Visit (INDEPENDENT_AMBULATORY_CARE_PROVIDER_SITE_OTHER): Payer: Medicare Other

## 2020-05-21 DIAGNOSIS — J4520 Mild intermittent asthma, uncomplicated: Secondary | ICD-10-CM

## 2020-05-21 MED ORDER — MEPOLIZUMAB 100 MG ~~LOC~~ SOLR
100.0000 mg | Freq: Once | SUBCUTANEOUS | Status: AC
  Administered 2020-05-21: 100 mg via SUBCUTANEOUS

## 2020-05-21 NOTE — Progress Notes (Signed)
Have you been hospitalized within the last 10 days?  No Do you have a fever?  No Do you have a cough?  No Do you have a headache or sore throat? No  

## 2020-05-26 ENCOUNTER — Other Ambulatory Visit (HOSPITAL_COMMUNITY): Payer: Self-pay | Admitting: Pharmacy Technician

## 2020-05-27 ENCOUNTER — Encounter: Payer: Self-pay | Admitting: Internal Medicine

## 2020-05-27 ENCOUNTER — Ambulatory Visit (INDEPENDENT_AMBULATORY_CARE_PROVIDER_SITE_OTHER): Payer: Medicare Other | Admitting: Internal Medicine

## 2020-05-27 ENCOUNTER — Other Ambulatory Visit: Payer: Self-pay

## 2020-05-27 VITALS — BP 126/62 | HR 57 | Ht 62.0 in | Wt 194.2 lb

## 2020-05-27 DIAGNOSIS — I483 Typical atrial flutter: Secondary | ICD-10-CM | POA: Diagnosis not present

## 2020-05-27 DIAGNOSIS — I4819 Other persistent atrial fibrillation: Secondary | ICD-10-CM | POA: Diagnosis not present

## 2020-05-27 DIAGNOSIS — I10 Essential (primary) hypertension: Secondary | ICD-10-CM

## 2020-05-27 DIAGNOSIS — I495 Sick sinus syndrome: Secondary | ICD-10-CM | POA: Diagnosis not present

## 2020-05-27 DIAGNOSIS — D6869 Other thrombophilia: Secondary | ICD-10-CM | POA: Diagnosis not present

## 2020-05-27 NOTE — Patient Instructions (Addendum)
Medication Instructions:  Your physician recommends that you continue on your current medications as directed. Please refer to the Current Medication list given to you today.  Labwork: None ordered.  Testing/Procedures: None ordered.  Follow-Up: Your physician wants you to follow-up in: 6 months with the Atrial Fibrillation Clinic. They will contact you to schedule.    Any Other Special Instructions Will Be Listed Below (If Applicable).  If you need a refill on your cardiac medications before your next appointment, please call your pharmacy.

## 2020-05-27 NOTE — Progress Notes (Signed)
PCP: Monico Blitz, MD Primary Cardiologist: Dr Domenic Polite Primary EP: Dr Rayann Heman  [name] [name] is a 80 y.o. female who presents today for routine electrophysiology followup.  Since last being seen in our clinic, the patient reports doing very well.  She is not very active.  Her SOB is stable.  She has frequent fatigue, which is chronic. Today, she denies symptoms of palpitations, chest pain,   lower extremity edema, dizziness, presyncope, or syncope.  The patient is otherwise without complaint today.   Past Medical History:  Diagnosis Date  . Allergic rhinitis   . Anxiety   . Asthma   . Atrial flutter ([place])   . COPD (chronic obstructive pulmonary disease) ([place])   . Depression   . Essential hypertension   . Hyperlipidemia   . Lumbar disc disease   . Obesity   . OSA (obstructive sleep apnea)    CPAP  . Paroxysmal atrial fibrillation (HCC)    Element of tachycardia bradycardia syndrome  . Respiratory failure [facility])    Past Surgical History:  Procedure Laterality Date  . ANTERIOR FUSION LUMBAR SPINE    . BIOPSY  08/27/2016   Procedure: BIOPSY;  Surgeon: Rogene Houston, MD;  [facility];  Service: Endoscopy;;  FOUR GASTRIC POLYPS BIOPSIED  . CARDIOVERSION N/A 03/28/2014   Procedure: CARDIOVERSION;  Surgeon: Fay Records, MD;  Location: [facility] ORS;  Service: Cardiovascular;  Laterality: N/A;  . CARDIOVERSION N/A 10/22/2016   Procedure: CARDIOVERSION;  Surgeon: Sueanne Margarita, MD;  Location: [facility] ENDOSCOPY;  Service: Cardiovascular;  Laterality: N/A;  . CATARACT EXTRACTION    . COLONOSCOPY N/A 08/04/2012   Procedure: COLONOSCOPY;  Surgeon: Rogene Houston, MD;  [facility];  Service: Endoscopy;  Laterality: N/A;  730-rescheduled to [place] notified pt  . ELECTROPHYSIOLOGIC STUDY N/A 09/18/2014   Procedure: Atrial Fibrillation Ablation;  Surgeon: Thompson Grayer, MD;  Location: [place] CV LAB;  Service: Cardiovascular;  Laterality: N/A;  .  ESOPHAGOGASTRODUODENOSCOPY N/A 08/27/2016   Procedure: ESOPHAGOGASTRODUODENOSCOPY (EGD);  Surgeon: Rogene Houston, MD;  [facility];  Service: Endoscopy;  Laterality: N/A;  1200  . LASIK     Both eyes  . TEE WITHOUT CARDIOVERSION N/A 09/18/2014   Procedure: TRANSESOPHAGEAL ECHOCARDIOGRAM (TEE);  Surgeon: Larey Dresser, MD;  Location: [place];  Service: Cardiovascular;  Laterality: N/A;  . TEE WITHOUT CARDIOVERSION N/A 10/22/2016   Procedure: TRANSESOPHAGEAL ECHOCARDIOGRAM (TEE);  Surgeon: Sueanne Margarita, MD;  Location: [facility] ENDOSCOPY;  Service: Cardiovascular;  Laterality: N/A;  . TEE WITHOUT CARDIOVERSION N/A 08/24/2017   Procedure: TRANSESOPHAGEAL ECHOCARDIOGRAM (TEE);  Surgeon: Josue Hector, MD;  Location: [facility] ENDOSCOPY;  Service: Cardiovascular;  Laterality: N/A;  . TOTAL HIP ARTHROPLASTY  2010   Right    ROS- all systems are reviewed and negatives except as per HPI above  Current Outpatient Medications  Medication Sig Dispense Refill  . acetaminophen (TYLENOL) 500 MG tablet Take 500 mg by mouth daily as needed for headache.    Jearl Klinefelter ELLIPTA 62.5-25 MCG/INH AEPB INHALE ONE PUFF INTO THE LUNGS DAILY 60 each 5  . antiseptic oral rinse (BIOTENE) LIQD 15 mLs by Mouth Rinse route as needed for dry mouth.    Marland Kitchen atorvastatin (LIPITOR) 10 MG tablet Take 10 mg by mouth every evening.     . benzonatate (TESSALON) 200 MG capsule Take 1 capsule (200 mg total) by mouth 3 (three) times daily as needed for cough. 30 capsule 3  .  Biotin 1 MG CAPS Take 1,000 mg by mouth 3 (three) times a week.     . budesonide (PULMICORT) 0.5 MG/2ML nebulizer solution Take 0.5 mg by nebulization daily.     . Calcium Carbonate-Vit D-Min (QC CALCIUM-MAGNESIUM-ZINC-D3) 333.4-133 MG-UNIT TABS Take 1 tablet by mouth 2 (two) times daily.     . cetirizine (ZYRTEC) 10 MG tablet Take 10 mg by mouth daily as needed for allergies.    . clindamycin (CLEOCIN) 300 MG capsule 2 capsules as needed. Take prior to  dental procedures  0  . DERMA-SMOOTHE/FS SCALP 0.01 % OIL APPLY A SMALL AMOUNT TO SKIN THREE TIMES A WEEK, PART HAIR APPLY TO SCALP, COVER WITH PLASTIC CAP, [place] OUT IN THE MONING    . diltiazem (CARDIZEM) 30 MG tablet TAKE 1 TABLET BY MOUTH AS NEEDED FOR PALPITATIONS 45 tablet 2  . diltiazem (CARTIA XT) 120 MG 24 hr capsule Take 1 capsule (120 mg total) by mouth at bedtime. 90 capsule 2  . dofetilide (TIKOSYN) 500 MCG capsule Take 1 capsule (500 mcg total) by mouth 2 (two) times daily. 180 capsule 2  . EPINEPHrine 0.3 mg/0.3 mL IJ SOAJ injection Inject into the muscle.    . famotidine (PEPCID) 20 MG tablet Take 2 tablets (40 mg total) by mouth at bedtime as needed. 30 tablet 1  . fluticasone (FLONASE) 50 MCG/ACT nasal spray Place 1 spray into both nostrils 2 (two) times daily.    . hydrALAZINE (APRESOLINE) 100 MG tablet Take 100 mg by mouth 2 (two) times daily.    Marland Kitchen ipratropium-albuterol (DUONEB) 0.5-2.5 (3) MG/3ML SOLN Take 3 mLs by nebulization daily.     Marland Kitchen ketoconazole (NIZORAL) 2 % shampoo Apply topically.    Marland Kitchen losartan (COZAAR) 100 MG tablet Take 1 tablet (100 mg total) by mouth daily. 90 tablet 2  . Magnesium 400 MG TABS Take 400 mg by mouth daily.    . metroNIDAZOLE (METROGEL) 1 % gel     . montelukast (SINGULAIR) 10 MG tablet Take 10 mg by mouth at bedtime.    . pantoprazole (PROTONIX) 40 MG tablet Take 40 mg by mouth in the morning.    . potassium chloride SA (KLOR-CON) 20 MEQ tablet Take 1 tablet (20 mEq total) by mouth daily. 90 tablet 2  . Sodium Chloride-Sodium Bicarb (AYR SALINE NASAL NETI RINSE) 1.57 g PACK Place 1 each into the nose daily as needed (rinse nasal often).    . triamcinolone ointment (KENALOG) 0.1 % Apply 1 application topically daily as needed for rash.  2  . warfarin (COUMADIN) 5 MG tablet Take 5 mg by mouth as directed.    . Wheat Dextrin (BENEFIBER ON THE GO) POWD Take 1 Dose by mouth daily as needed (constipation). 2 tsp daily in the morning.     Current  Facility-Administered Medications  Medication Dose Route Frequency Provider Last Rate Last Admin  . Mepolizumab SOLR 100 mg  100 mg Subcutaneous Q28 days Baird Lyons D, MD   100 mg at 09/07/19 0947  . Mepolizumab SOLR 100 mg  100 mg Subcutaneous Q28 days Annamaria Boots, Clinton D, MD   100 mg at 11/02/19 1123    Physical Exam: Vitals:   05/27/20 1232  BP: 126/62  Pulse: (!) 57  SpO2: 97%  Weight: 194 lb 3.2 oz (88.1 kg)  Height: 5\' 2"  (1.575 m)    GEN- The patient is well appearing, alert and oriented x 3 today.   Head- normocephalic, atraumatic Eyes-  Sclera clear, conjunctiva pink  Ears- hearing intact Oropharynx- clear Lungs- Clear to ausculation bilaterally, normal work of breathing Heart- Regular rate and rhythm, no murmurs, rubs or gallops, PMI not laterally displaced GI- soft, NT, ND, + BS Extremities- no clubbing, cyanosis, or edema  Wt Readings from Last 3 Encounters:  05/27/20 194 lb 3.2 oz (88.1 kg)  05/09/20 192 lb 12.8 oz (87.5 kg)  05/07/20 188 lb (85.3 kg)    EKG tracing ordered today is personally reviewed and shows sinus rhythm 57 bpm, PR 182 msec, QRS 92 msec, QTc 463 msec  Assessment and Plan:  1. Sinus bradycardia Perhaps partially responsible for her fatigue, though she has had these symptoms for years even when heart rates are higher. I did discuss option of PPM at length today.  We discussed pros and cons of pacing.   At this time, she is clear in her decision to avoid PPM.    2. Persistent afib/ atrial flutter Doing well with tikosyn She has severe LA enlargement She feels that her AF is stable Labs from 2/22 reviewed We will follow her closely on tikosyn to avoid toxicity  3. HTN Stable No change required today  4. Obesity Body mass index is 35.52 kg/m. Lifestyle modification is advised  5. COPD/SOB Follows with pulmonary  Risks, benefits and potential toxicities for medications prescribed and/or refilled reviewed with patient today.    Return to AF clinic every 6 months  Thompson Grayer MD, [facility] 05/27/2020 12:49 PM

## 2020-06-05 DIAGNOSIS — I4891 Unspecified atrial fibrillation: Secondary | ICD-10-CM | POA: Diagnosis not present

## 2020-06-05 DIAGNOSIS — I10 Essential (primary) hypertension: Secondary | ICD-10-CM | POA: Diagnosis not present

## 2020-06-05 DIAGNOSIS — Z299 Encounter for prophylactic measures, unspecified: Secondary | ICD-10-CM | POA: Diagnosis not present

## 2020-06-05 DIAGNOSIS — K219 Gastro-esophageal reflux disease without esophagitis: Secondary | ICD-10-CM | POA: Diagnosis not present

## 2020-06-05 DIAGNOSIS — Z6834 Body mass index (BMI) 34.0-34.9, adult: Secondary | ICD-10-CM | POA: Diagnosis not present

## 2020-06-12 DIAGNOSIS — I10 Essential (primary) hypertension: Secondary | ICD-10-CM | POA: Diagnosis not present

## 2020-06-12 DIAGNOSIS — J449 Chronic obstructive pulmonary disease, unspecified: Secondary | ICD-10-CM | POA: Diagnosis not present

## 2020-06-12 DIAGNOSIS — Z299 Encounter for prophylactic measures, unspecified: Secondary | ICD-10-CM | POA: Diagnosis not present

## 2020-06-12 DIAGNOSIS — I4891 Unspecified atrial fibrillation: Secondary | ICD-10-CM | POA: Diagnosis not present

## 2020-06-12 DIAGNOSIS — M87059 Idiopathic aseptic necrosis of unspecified femur: Secondary | ICD-10-CM | POA: Diagnosis not present

## 2020-06-12 DIAGNOSIS — L0291 Cutaneous abscess, unspecified: Secondary | ICD-10-CM | POA: Diagnosis not present

## 2020-06-14 ENCOUNTER — Telehealth: Payer: Self-pay | Admitting: Internal Medicine

## 2020-06-14 NOTE — Telephone Encounter (Signed)
I have called and LM on VM for the pt to make her aware that the patient assistance forms have been placed in the mail to her today.

## 2020-06-19 ENCOUNTER — Other Ambulatory Visit: Payer: Self-pay

## 2020-06-19 ENCOUNTER — Ambulatory Visit (INDEPENDENT_AMBULATORY_CARE_PROVIDER_SITE_OTHER): Payer: Medicare Other

## 2020-06-19 ENCOUNTER — Ambulatory Visit: Payer: Medicare Other | Admitting: Podiatry

## 2020-06-19 VITALS — BP 165/65 | HR 59 | Temp 97.9°F | Resp 16

## 2020-06-19 DIAGNOSIS — L0291 Cutaneous abscess, unspecified: Secondary | ICD-10-CM | POA: Diagnosis not present

## 2020-06-19 DIAGNOSIS — Z6835 Body mass index (BMI) 35.0-35.9, adult: Secondary | ICD-10-CM | POA: Diagnosis not present

## 2020-06-19 DIAGNOSIS — J4520 Mild intermittent asthma, uncomplicated: Secondary | ICD-10-CM

## 2020-06-19 DIAGNOSIS — Z299 Encounter for prophylactic measures, unspecified: Secondary | ICD-10-CM | POA: Diagnosis not present

## 2020-06-19 DIAGNOSIS — I4891 Unspecified atrial fibrillation: Secondary | ICD-10-CM | POA: Diagnosis not present

## 2020-06-19 DIAGNOSIS — I10 Essential (primary) hypertension: Secondary | ICD-10-CM | POA: Diagnosis not present

## 2020-06-19 MED ORDER — METHYLPREDNISOLONE SODIUM SUCC 125 MG IJ SOLR: 125.0000 mg | Freq: Once | INTRAMUSCULAR | Status: DC | PRN

## 2020-06-19 MED ORDER — EPINEPHRINE 0.3 MG/0.3ML IJ SOAJ: 0.3000 mg | Freq: Once | INTRAMUSCULAR | Status: DC | PRN

## 2020-06-19 MED ORDER — SODIUM CHLORIDE 0.9 % IV SOLN: Freq: Once | INTRAVENOUS | Status: DC | PRN

## 2020-06-19 MED ORDER — MEPOLIZUMAB 100 MG/ML ~~LOC~~ SOSY
100.0000 mg | PREFILLED_SYRINGE | Freq: Once | SUBCUTANEOUS | Status: AC
  Administered 2020-06-19: 100 mg via SUBCUTANEOUS
  Filled 2020-06-19: qty 1

## 2020-06-19 MED ORDER — ALBUTEROL SULFATE HFA 108 (90 BASE) MCG/ACT IN AERS: 2.0000 | INHALATION_SPRAY | Freq: Once | RESPIRATORY_TRACT | Status: DC | PRN

## 2020-06-19 MED ORDER — DIPHENHYDRAMINE HCL 50 MG/ML IJ SOLN: 50.0000 mg | Freq: Once | INTRAMUSCULAR | Status: DC | PRN

## 2020-06-19 MED ORDER — FAMOTIDINE IN NACL 20-0.9 MG/50ML-% IV SOLN: 20.0000 mg | Freq: Once | INTRAVENOUS | Status: DC | PRN

## 2020-06-19 NOTE — Progress Notes (Signed)
Diagnosis: Asthma  Provider:Mannam Praveen   Procedure: Injection, Medication: Mepolizumab, Site: subcutaneous by .Koren Shiver, RN  Discharge: Condition: Good, Destination: Home . AVS provided. by Koren Shiver, RN

## 2020-06-24 ENCOUNTER — Telehealth: Payer: Self-pay | Admitting: Internal Medicine

## 2020-06-24 NOTE — Telephone Encounter (Signed)
disregard

## 2020-06-25 ENCOUNTER — Other Ambulatory Visit (INDEPENDENT_AMBULATORY_CARE_PROVIDER_SITE_OTHER): Payer: Self-pay

## 2020-06-25 ENCOUNTER — Telehealth (INDEPENDENT_AMBULATORY_CARE_PROVIDER_SITE_OTHER): Payer: Self-pay | Admitting: Internal Medicine

## 2020-06-25 DIAGNOSIS — K219 Gastro-esophageal reflux disease without esophagitis: Secondary | ICD-10-CM

## 2020-06-25 MED ORDER — PANTOPRAZOLE SODIUM 40 MG PO TBEC: 40.0000 mg | DELAYED_RELEASE_TABLET | Freq: Every morning | ORAL | 3 refills | Status: DC

## 2020-06-25 NOTE — Telephone Encounter (Signed)
Patient aware we have sent the prescription into [facility].

## 2020-06-25 NOTE — Telephone Encounter (Signed)
Patient left message stating her pharmacy has sent two requests for a Protonix refill - states she has 2 pills left - please advise - ph# [phone]

## 2020-06-27 DIAGNOSIS — I10 Essential (primary) hypertension: Secondary | ICD-10-CM | POA: Diagnosis not present

## 2020-06-28 ENCOUNTER — Ambulatory Visit: Payer: Medicare Other | Admitting: Internal Medicine

## 2020-06-28 ENCOUNTER — Other Ambulatory Visit: Payer: Self-pay | Admitting: *Deleted

## 2020-06-28 ENCOUNTER — Other Ambulatory Visit: Payer: Self-pay

## 2020-06-28 ENCOUNTER — Telehealth: Payer: Self-pay | Admitting: Internal Medicine

## 2020-06-28 ENCOUNTER — Other Ambulatory Visit (HOSPITAL_COMMUNITY): Payer: Self-pay | Admitting: Nurse Practitioner

## 2020-06-28 ENCOUNTER — Ambulatory Visit (INDEPENDENT_AMBULATORY_CARE_PROVIDER_SITE_OTHER): Payer: Medicare Other | Admitting: Podiatry

## 2020-06-28 ENCOUNTER — Encounter: Payer: Self-pay | Admitting: Podiatry

## 2020-06-28 DIAGNOSIS — M79674 Pain in right toe(s): Secondary | ICD-10-CM | POA: Diagnosis not present

## 2020-06-28 DIAGNOSIS — M79675 Pain in left toe(s): Secondary | ICD-10-CM

## 2020-06-28 DIAGNOSIS — M2011 Hallux valgus (acquired), right foot: Secondary | ICD-10-CM

## 2020-06-28 DIAGNOSIS — M2141 Flat foot [pes planus] (acquired), right foot: Secondary | ICD-10-CM

## 2020-06-28 DIAGNOSIS — M2142 Flat foot [pes planus] (acquired), left foot: Secondary | ICD-10-CM

## 2020-06-28 DIAGNOSIS — B351 Tinea unguium: Secondary | ICD-10-CM

## 2020-06-28 DIAGNOSIS — M2042 Other hammer toe(s) (acquired), left foot: Secondary | ICD-10-CM

## 2020-06-28 DIAGNOSIS — M2012 Hallux valgus (acquired), left foot: Secondary | ICD-10-CM

## 2020-06-28 DIAGNOSIS — M2041 Other hammer toe(s) (acquired), right foot: Secondary | ICD-10-CM

## 2020-06-28 MED ORDER — ANORO ELLIPTA 62.5-25 MCG/INH IN AEPB: INHALATION_SPRAY | RESPIRATORY_TRACT | 11 refills | Status: DC

## 2020-06-28 NOTE — Progress Notes (Signed)
RX for [employer] for patient assistance paperwork

## 2020-06-28 NOTE — Progress Notes (Signed)
  Subjective:  Patient ID: [name] [name], female    DOB: 22-Sep-1940,  MRN: [id number]  [name] [name] presents to clinic today for painful thick toenails that are difficult to trim. Pain interferes with ambulation. Aggravating factors include wearing enclosed shoe gear. Pain is relieved with periodic professional debridement.  Allergies  Allergen Reactions  . Bupropion Hcl Other (See Comments)    Suicidal Thoughts.   . Escitalopram Oxalate Other (See Comments)    Suicidal Thoughts.   . Fluticasone-Salmeterol Other (See Comments)    Advair - Caused patient to go into Afib.   Marland Kitchen Lisinopril Cough  . Serevent Other (See Comments)    Caused patient to go into Afib   Objective:   Constitutional ARIANNE KLINGE is a pleasant 80 y.o. Caucasian female, in NAD.Marland Kitchen AAO x 3.   Vascular Capillary refill time to digits immediate b/l. Palpable pedal pulses b/l LE. Pedal hair sparse. Lower extremity skin temperature gradient within normal limits. No pain with calf compression b/l. No edema noted b/l lower extremities.  No cyanosis or clubbing noted.  Neurologic Normal speech. Oriented to person, place, and time. Epicritic sensation to light touch grossly present bilaterally. Protective sensation intact 5/5 intact bilaterally with 10g monofilament b/l. Proprioception intact bilaterally.  Dermatologic Pedal skin with normal turgor, texture and tone bilaterally. No open wounds bilaterally. No interdigital macerations bilaterally. Toenails 1-5 right, L hallux, L 4th toe and L 5th toe elongated, discolored, dystrophic, thickened, and crumbly with subungual debris and tenderness to dorsal palpation. Nondystrophic toenails L 2nd toe and L 3rd toe. Incurvated nailplate lateral border(s) L hallux.  Nail border hypertrophy absent. There is tenderness to palpation. Sign(s) of infection: no clinical signs of infection noted on examination today..  Orthopedic: Normal muscle strength 5/5 to all lower extremity  muscle groups bilaterally. No pain crepitus or joint limitation noted with ROM b/l. Hallux valgus with bunion deformity noted b/l lower extremities. Hammertoes noted to the b/l lower extremities. Pes planus deformity noted b/l.    Radiographs: None Assessment:   1. Pain due to onychomycosis of toenails of both feet   2. Hammertoes of both feet   3. Hallux valgus, acquired, bilateral   4. Pes planus of both feet    Plan:  Patient was evaluated and treated and all questions answered.  Onychomycosis with pain -Nails palliatively debridement as below -Educated on self-care  Procedure: Nail Debridement Rationale: Pain Type of Debridement: manual, sharp debridement. Instrumentation: Nail nipper, rotary burr. Number of Nails: 8 -Examined patient. -Patient to continue soft, supportive shoe gear daily. -Toenails 1-5 right, L hallux, L 4th toe and L 5th toe debrided in length and girth without iatrogenic bleeding with sterile nail nipper and dremel.  -Offending nail border debrided and curretaged L hallux utilizing sterile nail nipper and currette. Border cleansed with alcohol and triple antibiotic applied. No further treatment required by patient/caregiver. -Patient to report any pedal injuries to medical professional immediately. -Patient/POA to call should there be question/concern in the interim.  Return in about 9 weeks (around 08/30/2020).  Marzetta Board, DPM

## 2020-06-28 NOTE — Telephone Encounter (Signed)
Patient called stating that she continues to be in A-Fib.She is wanting to talk with Dr. Rayann Heman in regards to the pace maker.

## 2020-06-28 NOTE — Telephone Encounter (Signed)
Per Dr. Rayann Heman & his review of recent BP log - heart rates would not warrant pacemaker at this time.  Can schedule visit in May to follow up in office with Dr. Rayann Heman.

## 2020-06-28 NOTE — Telephone Encounter (Signed)
Patient notified and verbalized understanding.   Stated that she had more questions in regards to her heart rate staying elevated.  Stated it has been in the upper 90's for about the last month.  Concerned about having a stroke with her elevation in heart rate and BP's.  Stated that she has been using the short acting Diltiazem 30mg  as needed, but does not think that it is helping as much as it once did.  States if the temp gets up outside or inside over 70 degrees, makes her heart rate go up.  Also, she mentions that she is currently on Doxycycline for abscess one of her legs.

## 2020-07-01 NOTE — Progress Notes (Signed)
Diagnosis: Asthma  Provider:  Praveen Mannam, MD  Procedure: Injection  Nucala (Mepolizumab), Dose: 100 mg, Site: subcutaneous  Discharge: Condition: Good, Destination: Home . AVS provided to patient.   Performed by:  Miosha Behe, RN        

## 2020-07-02 ENCOUNTER — Other Ambulatory Visit (HOSPITAL_COMMUNITY): Payer: Self-pay | Admitting: *Deleted

## 2020-07-02 ENCOUNTER — Other Ambulatory Visit (HOSPITAL_COMMUNITY): Payer: Self-pay | Admitting: Nurse Practitioner

## 2020-07-02 MED ORDER — DILTIAZEM HCL 30 MG PO TABS: ORAL_TABLET | ORAL | 2 refills | Status: DC

## 2020-07-03 DIAGNOSIS — I10 Essential (primary) hypertension: Secondary | ICD-10-CM | POA: Diagnosis not present

## 2020-07-03 DIAGNOSIS — M9901 Segmental and somatic dysfunction of cervical region: Secondary | ICD-10-CM | POA: Diagnosis not present

## 2020-07-03 DIAGNOSIS — M546 Pain in thoracic spine: Secondary | ICD-10-CM | POA: Diagnosis not present

## 2020-07-03 DIAGNOSIS — L0291 Cutaneous abscess, unspecified: Secondary | ICD-10-CM | POA: Diagnosis not present

## 2020-07-03 DIAGNOSIS — M542 Cervicalgia: Secondary | ICD-10-CM | POA: Diagnosis not present

## 2020-07-03 DIAGNOSIS — I4891 Unspecified atrial fibrillation: Secondary | ICD-10-CM | POA: Diagnosis not present

## 2020-07-03 DIAGNOSIS — M26602 Left temporomandibular joint disorder, unspecified: Secondary | ICD-10-CM | POA: Diagnosis not present

## 2020-07-03 DIAGNOSIS — M9903 Segmental and somatic dysfunction of lumbar region: Secondary | ICD-10-CM | POA: Diagnosis not present

## 2020-07-03 DIAGNOSIS — M9905 Segmental and somatic dysfunction of pelvic region: Secondary | ICD-10-CM | POA: Diagnosis not present

## 2020-07-03 DIAGNOSIS — Z299 Encounter for prophylactic measures, unspecified: Secondary | ICD-10-CM | POA: Diagnosis not present

## 2020-07-03 DIAGNOSIS — M8580 Other specified disorders of bone density and structure, unspecified site: Secondary | ICD-10-CM | POA: Diagnosis not present

## 2020-07-03 DIAGNOSIS — Z6835 Body mass index (BMI) 35.0-35.9, adult: Secondary | ICD-10-CM | POA: Diagnosis not present

## 2020-07-03 DIAGNOSIS — M9902 Segmental and somatic dysfunction of thoracic region: Secondary | ICD-10-CM | POA: Diagnosis not present

## 2020-07-03 DIAGNOSIS — M48061 Spinal stenosis, lumbar region without neurogenic claudication: Secondary | ICD-10-CM | POA: Diagnosis not present

## 2020-07-05 DIAGNOSIS — M8580 Other specified disorders of bone density and structure, unspecified site: Secondary | ICD-10-CM | POA: Insufficient documentation

## 2020-07-08 DIAGNOSIS — H04123 Dry eye syndrome of bilateral lacrimal glands: Secondary | ICD-10-CM | POA: Diagnosis not present

## 2020-07-08 NOTE — Telephone Encounter (Signed)
Patient notified and verbalized understanding.  Stated that she spoke with Marzetta Board in the AFib clinic & she was able to answer her questions.

## 2020-07-11 DIAGNOSIS — M26602 Left temporomandibular joint disorder, unspecified: Secondary | ICD-10-CM | POA: Diagnosis not present

## 2020-07-11 DIAGNOSIS — M546 Pain in thoracic spine: Secondary | ICD-10-CM | POA: Diagnosis not present

## 2020-07-11 DIAGNOSIS — M542 Cervicalgia: Secondary | ICD-10-CM | POA: Diagnosis not present

## 2020-07-11 DIAGNOSIS — M9905 Segmental and somatic dysfunction of pelvic region: Secondary | ICD-10-CM | POA: Diagnosis not present

## 2020-07-11 DIAGNOSIS — M48061 Spinal stenosis, lumbar region without neurogenic claudication: Secondary | ICD-10-CM | POA: Diagnosis not present

## 2020-07-11 DIAGNOSIS — M9901 Segmental and somatic dysfunction of cervical region: Secondary | ICD-10-CM | POA: Diagnosis not present

## 2020-07-11 DIAGNOSIS — M9903 Segmental and somatic dysfunction of lumbar region: Secondary | ICD-10-CM | POA: Diagnosis not present

## 2020-07-11 DIAGNOSIS — M9902 Segmental and somatic dysfunction of thoracic region: Secondary | ICD-10-CM | POA: Diagnosis not present

## 2020-07-17 DIAGNOSIS — M8580 Other specified disorders of bone density and structure, unspecified site: Secondary | ICD-10-CM | POA: Diagnosis not present

## 2020-07-24 ENCOUNTER — Ambulatory Visit (INDEPENDENT_AMBULATORY_CARE_PROVIDER_SITE_OTHER): Payer: Medicare Other

## 2020-07-24 ENCOUNTER — Other Ambulatory Visit: Payer: Self-pay

## 2020-07-24 VITALS — BP 155/54 | Temp 97.3°F | Resp 18

## 2020-07-24 DIAGNOSIS — Z7189 Other specified counseling: Secondary | ICD-10-CM | POA: Diagnosis not present

## 2020-07-24 DIAGNOSIS — L218 Other seborrheic dermatitis: Secondary | ICD-10-CM | POA: Diagnosis not present

## 2020-07-24 DIAGNOSIS — L905 Scar conditions and fibrosis of skin: Secondary | ICD-10-CM | POA: Diagnosis not present

## 2020-07-24 DIAGNOSIS — I4891 Unspecified atrial fibrillation: Secondary | ICD-10-CM | POA: Diagnosis not present

## 2020-07-24 DIAGNOSIS — Z0000 Encounter for general adult medical examination without abnormal findings: Secondary | ICD-10-CM | POA: Diagnosis not present

## 2020-07-24 DIAGNOSIS — Z6835 Body mass index (BMI) 35.0-35.9, adult: Secondary | ICD-10-CM | POA: Diagnosis not present

## 2020-07-24 DIAGNOSIS — J4520 Mild intermittent asthma, uncomplicated: Secondary | ICD-10-CM | POA: Diagnosis not present

## 2020-07-24 DIAGNOSIS — L820 Inflamed seborrheic keratosis: Secondary | ICD-10-CM | POA: Diagnosis not present

## 2020-07-24 DIAGNOSIS — L578 Other skin changes due to chronic exposure to nonionizing radiation: Secondary | ICD-10-CM | POA: Diagnosis not present

## 2020-07-24 DIAGNOSIS — Z299 Encounter for prophylactic measures, unspecified: Secondary | ICD-10-CM | POA: Diagnosis not present

## 2020-07-24 DIAGNOSIS — D1801 Hemangioma of skin and subcutaneous tissue: Secondary | ICD-10-CM | POA: Diagnosis not present

## 2020-07-24 DIAGNOSIS — L814 Other melanin hyperpigmentation: Secondary | ICD-10-CM | POA: Diagnosis not present

## 2020-07-24 DIAGNOSIS — L821 Other seborrheic keratosis: Secondary | ICD-10-CM | POA: Diagnosis not present

## 2020-07-24 DIAGNOSIS — Z1331 Encounter for screening for depression: Secondary | ICD-10-CM | POA: Diagnosis not present

## 2020-07-24 DIAGNOSIS — Z1339 Encounter for screening examination for other mental health and behavioral disorders: Secondary | ICD-10-CM | POA: Diagnosis not present

## 2020-07-24 DIAGNOSIS — E7800 Pure hypercholesterolemia, unspecified: Secondary | ICD-10-CM | POA: Diagnosis not present

## 2020-07-24 DIAGNOSIS — L853 Xerosis cutis: Secondary | ICD-10-CM | POA: Diagnosis not present

## 2020-07-24 MED ORDER — FAMOTIDINE IN NACL 20-0.9 MG/50ML-% IV SOLN: 20.0000 mg | Freq: Once | INTRAVENOUS | Status: DC | PRN

## 2020-07-24 MED ORDER — MEPOLIZUMAB 100 MG/ML ~~LOC~~ SOSY
100.0000 mg | PREFILLED_SYRINGE | Freq: Once | SUBCUTANEOUS | Status: AC
Start: 2020-07-24 — End: 2020-07-24
  Administered 2020-07-24: 100 mg via SUBCUTANEOUS
  Filled 2020-07-24: qty 1

## 2020-07-24 MED ORDER — DIPHENHYDRAMINE HCL 50 MG/ML IJ SOLN: 50.0000 mg | Freq: Once | INTRAMUSCULAR | Status: DC | PRN

## 2020-07-24 MED ORDER — ALBUTEROL SULFATE HFA 108 (90 BASE) MCG/ACT IN AERS: 2.0000 | INHALATION_SPRAY | Freq: Once | RESPIRATORY_TRACT | Status: DC | PRN

## 2020-07-24 MED ORDER — SODIUM CHLORIDE 0.9 % IV SOLN: Freq: Once | INTRAVENOUS | Status: DC | PRN

## 2020-07-24 MED ORDER — EPINEPHRINE 0.3 MG/0.3ML IJ SOAJ: 0.3000 mg | Freq: Once | INTRAMUSCULAR | Status: DC | PRN

## 2020-07-24 MED ORDER — METHYLPREDNISOLONE SODIUM SUCC 125 MG IJ SOLR: 125.0000 mg | Freq: Once | INTRAMUSCULAR | Status: DC | PRN

## 2020-07-24 NOTE — Progress Notes (Signed)
Diagnosis: Asthma  Provider:  Praveen Mannam, MD  Procedure: Injection  Nucala (Mepolizumab), Dose: 100 mg, Site: subcutaneous  Discharge: Condition: Good, Destination: Home . AVS provided to patient.   Performed by:  Emme Rosenau, RN       

## 2020-07-26 DIAGNOSIS — E559 Vitamin D deficiency, unspecified: Secondary | ICD-10-CM | POA: Diagnosis not present

## 2020-07-26 DIAGNOSIS — R5383 Other fatigue: Secondary | ICD-10-CM | POA: Diagnosis not present

## 2020-07-26 DIAGNOSIS — Z79899 Other long term (current) drug therapy: Secondary | ICD-10-CM | POA: Diagnosis not present

## 2020-07-26 DIAGNOSIS — E7800 Pure hypercholesterolemia, unspecified: Secondary | ICD-10-CM | POA: Diagnosis not present

## 2020-07-27 DIAGNOSIS — I10 Essential (primary) hypertension: Secondary | ICD-10-CM | POA: Diagnosis not present

## 2020-07-30 DIAGNOSIS — E2839 Other primary ovarian failure: Secondary | ICD-10-CM | POA: Diagnosis not present

## 2020-08-02 ENCOUNTER — Encounter: Payer: Self-pay | Admitting: Internal Medicine

## 2020-08-02 ENCOUNTER — Ambulatory Visit (INDEPENDENT_AMBULATORY_CARE_PROVIDER_SITE_OTHER): Payer: Medicare Other | Admitting: Internal Medicine

## 2020-08-02 VITALS — BP 142/78 | HR 53 | Ht 62.0 in | Wt 196.2 lb

## 2020-08-02 DIAGNOSIS — I10 Essential (primary) hypertension: Secondary | ICD-10-CM

## 2020-08-02 DIAGNOSIS — I4819 Other persistent atrial fibrillation: Secondary | ICD-10-CM | POA: Diagnosis not present

## 2020-08-02 DIAGNOSIS — D6869 Other thrombophilia: Secondary | ICD-10-CM | POA: Diagnosis not present

## 2020-08-02 NOTE — Patient Instructions (Addendum)
Medication Instructions:  Continue all current medications.  Labwork: none  Testing/Procedures: AFib ablation - [place] staff will contact patient.    Follow-Up: To be determined   Any Other Special Instructions Will Be Listed Below (If Applicable).  If you need a refill on your cardiac medications before your next appointment, please call your pharmacy.

## 2020-08-02 NOTE — Progress Notes (Signed)
PCP: Monico Blitz, MD Primary Cardiologist: Dr Domenic Polite Primary EP: Dr Rayann Heman  [name] [name] is a 80 y.o. female who presents today for routine electrophysiology followup.  Since last being seen in our clinic, the patient reports doing reasonably well.  She is not very active.  She has SOB chronically. She also has fatigue chronically.  Today, she denies symptoms of palpitations, chest pain,  lower extremity edema, dizziness, presyncope, or syncope.  The patient is otherwise without complaint today.   Past Medical History:  Diagnosis Date  . Allergic rhinitis   . Anxiety   . Asthma   . Atrial flutter ([place])   . COPD (chronic obstructive pulmonary disease) ([place])   . Depression   . Essential hypertension   . Hyperlipidemia   . Lumbar disc disease   . Obesity   . OSA (obstructive sleep apnea)    CPAP  . Paroxysmal atrial fibrillation (HCC)    Element of tachycardia bradycardia syndrome  . Respiratory failure [facility])    Past Surgical History:  Procedure Laterality Date  . ANTERIOR FUSION LUMBAR SPINE    . BIOPSY  08/27/2016   Procedure: BIOPSY;  Surgeon: Rogene Houston, MD;  [facility];  Service: Endoscopy;;  FOUR GASTRIC POLYPS BIOPSIED  . CARDIOVERSION N/A 03/28/2014   Procedure: CARDIOVERSION;  Surgeon: Fay Records, MD;  Location: [facility]S;  Service: Cardiovascular;  Laterality: N/A;  . CARDIOVERSION N/A 10/22/2016   Procedure: CARDIOVERSION;  Surgeon: Sueanne Margarita, MD;  Location: [facility] ENDOSCOPY;  Service: Cardiovascular;  Laterality: N/A;  . CATARACT EXTRACTION    . COLONOSCOPY N/A 08/04/2012   Procedure: COLONOSCOPY;  Surgeon: Rogene Houston, MD;  [facility];  Service: Endoscopy;  Laterality: N/A;  730-rescheduled to [place] notified pt  . ELECTROPHYSIOLOGIC STUDY N/A 09/18/2014   Procedure: Atrial Fibrillation Ablation;  Surgeon: Thompson Grayer, MD;  Location: [place] CV LAB;  Service: Cardiovascular;  Laterality: N/A;  .  ESOPHAGOGASTRODUODENOSCOPY N/A 08/27/2016   Procedure: ESOPHAGOGASTRODUODENOSCOPY (EGD);  Surgeon: Rogene Houston, MD;  [facility];  Service: Endoscopy;  Laterality: N/A;  1200  . LASIK     Both eyes  . TEE WITHOUT CARDIOVERSION N/A 09/18/2014   Procedure: TRANSESOPHAGEAL ECHOCARDIOGRAM (TEE);  Surgeon: Larey Dresser, MD;  Location: [place];  Service: Cardiovascular;  Laterality: N/A;  . TEE WITHOUT CARDIOVERSION N/A 10/22/2016   Procedure: TRANSESOPHAGEAL ECHOCARDIOGRAM (TEE);  Surgeon: Sueanne Margarita, MD;  Location: [facility] ENDOSCOPY;  Service: Cardiovascular;  Laterality: N/A;  . TEE WITHOUT CARDIOVERSION N/A 08/24/2017   Procedure: TRANSESOPHAGEAL ECHOCARDIOGRAM (TEE);  Surgeon: Josue Hector, MD;  Location: [facility] ENDOSCOPY;  Service: Cardiovascular;  Laterality: N/A;  . TOTAL HIP ARTHROPLASTY  2010   Right    ROS- all systems are reviewed and negatives except as per HPI above  Current Outpatient Medications  Medication Sig Dispense Refill  . acetaminophen (TYLENOL) 500 MG tablet Take 500 mg by mouth daily as needed for headache.    Marland Kitchen antiseptic oral rinse (BIOTENE) LIQD 15 mLs by Mouth Rinse route as needed for dry mouth.    Marland Kitchen atorvastatin (LIPITOR) 10 MG tablet Take 10 mg by mouth every evening.     . benzonatate (TESSALON) 200 MG capsule Take 1 capsule (200 mg total) by mouth 3 (three) times daily as needed for cough. 30 capsule 3  . Biotin 1 MG CAPS Take 1,000 mg by mouth 3 (three) times a week.     . budesonide (  PULMICORT) 0.5 MG/2ML nebulizer solution Take 0.5 mg by nebulization daily.     . BUDESONIDE PO USE ONE vial via NEBULIZER TWICE DAILY    . Calcium Carbonate-Vit D-Min (QC CALCIUM-MAGNESIUM-ZINC-D3) 333.4-133 MG-UNIT TABS Take 1 tablet by mouth 2 (two) times daily.     . cetirizine (ZYRTEC) 10 MG tablet Take 10 mg by mouth daily as needed for allergies.    . clindamycin (CLEOCIN) 300 MG capsule 2 capsules as needed. Take prior to dental procedures  0  .  DERMA-SMOOTHE/FS SCALP 0.01 % OIL APPLY A SMALL AMOUNT TO SKIN THREE TIMES A WEEK, PART HAIR APPLY TO SCALP, COVER WITH PLASTIC CAP, [place] OUT IN THE MONING    . diltiazem (CARDIZEM CD) 120 MG 24 hr capsule TAKE ONE CAPSULE BY MOUTH AT BEDTIME 90 capsule 2  . diltiazem (CARDIZEM) 30 MG tablet TAKE 1 TABLET BY MOUTH AS NEEDED FOR PALPITATIONS 45 tablet 2  . dofetilide (TIKOSYN) 500 MCG capsule TAKE 1 CAPSULE BY MOUTH TWICE DAILY 180 capsule 1  . EPINEPHrine 0.3 mg/0.3 mL IJ SOAJ injection Inject into the muscle.    . famotidine (PEPCID) 20 MG tablet Take 2 tablets (40 mg total) by mouth at bedtime as needed. 30 tablet 1  . fluticasone (FLONASE) 50 MCG/ACT nasal spray Place 1 spray into both nostrils 2 (two) times daily.    . hydrALAZINE (APRESOLINE) 100 MG tablet Take 100 mg by mouth 2 (two) times daily.    Marland Kitchen ipratropium-albuterol (DUONEB) 0.5-2.5 (3) MG/3ML SOLN Take 3 mLs by nebulization daily.     Marland Kitchen ketoconazole (NIZORAL) 2 % shampoo Apply topically.    Marland Kitchen losartan (COZAAR) 100 MG tablet Take 1 tablet (100 mg total) by mouth daily. 90 tablet 2  . Magnesium 400 MG TABS Take 400 mg by mouth daily.    . montelukast (SINGULAIR) 10 MG tablet Take 10 mg by mouth at bedtime.    . pantoprazole (PROTONIX) 40 MG tablet Take 1 tablet (40 mg total) by mouth in the morning. Take 40 mg by mouth in the morning. 90 tablet 3  . potassium chloride SA (KLOR-CON) 20 MEQ tablet Take 1 tablet (20 mEq total) by mouth daily. 90 tablet 2  . Sodium Chloride-Sodium Bicarb (AYR SALINE NASAL NETI RINSE) 1.57 g PACK Place 1 each into the nose daily as needed (rinse nasal often).    . triamcinolone ointment (KENALOG) 0.1 % Apply 1 application topically daily as needed for rash.  2  . umeclidinium-vilanterol (ANORO ELLIPTA) 62.5-25 MCG/INH AEPB INHALE ONE PUFF INTO THE LUNGS DAILY 60 each 11  . warfarin (COUMADIN) 5 MG tablet Take 5 mg by mouth as directed.    . Wheat Dextrin (BENEFIBER ON THE GO) POWD Take 1 Dose by mouth daily  as needed (constipation). 2 tsp daily in the morning.     Current Facility-Administered Medications  Medication Dose Route Frequency Provider Last Rate Last Admin  . Mepolizumab SOLR 100 mg  100 mg Subcutaneous Q28 days Baird Lyons D, MD   100 mg at 09/07/19 0947  . Mepolizumab SOLR 100 mg  100 mg Subcutaneous Q28 days Baird Lyons D, MD   100 mg at 11/02/19 1123    Physical Exam: Vitals:   08/02/20 0857  BP: (!) 142/78  Pulse: (!) 53  SpO2: 99%  Weight: 196 lb 3.2 oz (89 kg)  Height: 5\' 2"  (1.575 m)    GEN- The patient is overweight appearing, alert and oriented x 3 today.   Head- normocephalic, atraumatic  Eyes-  Sclera clear, conjunctiva pink Ears- hearing intact Oropharynx- clear Lungs- Clear to ausculation bilaterally, normal work of breathing Heart- Regular rate and rhythm, no murmurs, rubs or gallops, PMI not laterally displaced GI- soft, NT, ND, + BS Extremities- no clubbing, cyanosis, or edema  Echo 7/61/95- EF 09%, diastolic dysfunction, severe LA enlargement,   Wt Readings from Last 3 Encounters:  08/02/20 196 lb 3.2 oz (89 kg)  05/27/20 194 lb 3.2 oz (88.1 kg)  05/09/20 192 lb 12.8 oz (87.5 kg)    Assessment and Plan:  1. Sinus bradycardia Mostly improved Minimally symptomatic I think we should avoid pacing at this time.  2. Persistent afib/ atrial flutter She is doing well with tikosyn S/p afib ablation with PVI and CTI performed 09/18/2014. We will need to follow her closely on this medicine to avoid toxicity She has severe LA enlargement Risk, benefits, and alternatives to EP study and radiofrequency ablation for afib were also discussed in detail today. These risks include but are not limited to stroke, bleeding, vascular damage, tamponade, perforation, damage to the esophagus, lungs, and other structures, pulmonary vein stenosis, worsening renal function, and death. The patient understands these risk and wishes to proceed.  We will therefore proceed  with catheter ablation at the next available time.  Carto, ICE, anesthesia are requested for the procedure.  Will also obtain cardiac CT prior to the procedure to exclude LAA thrombus and further evaluate atrial anatomy.'  She is on coumadin.  We will plan to switch to xarelto 15mg  daily (CrCl 36) 3 weeks prior to ablation.  She will likely require samples.   3. Obesity Body mass index is 35.89 kg/m. Lifestyle modification is again advised  4. HTN Stable No change required today  5. COPD/ SOB Follows with pulmonary  Risks, benefits and potential toxicities for medications prescribed and/or refilled reviewed with patient today.   Thompson Grayer MD, [facility] 08/02/2020 9:19 AM

## 2020-08-06 ENCOUNTER — Encounter: Payer: Self-pay | Admitting: *Deleted

## 2020-08-06 ENCOUNTER — Telehealth: Payer: Self-pay | Admitting: *Deleted

## 2020-08-06 DIAGNOSIS — I4891 Unspecified atrial fibrillation: Secondary | ICD-10-CM

## 2020-08-06 MED ORDER — RIVAROXABAN 15 MG PO TABS: 15.0000 mg | ORAL_TABLET | Freq: Every day | ORAL | 3 refills | Status: DC

## 2020-08-06 NOTE — Progress Notes (Signed)
HPI female never smoker followed for OSA, allergic rhinitis, asthma, Hyper IgE,  complicated by A. Fib/Coumadin, HBP, GERD, obesity NPSG prior to EMR in 2012 Office Spirometry 09/02/16-WNL. FVC 2.06/76%, FEV1 1.57/77%, ratio 0.76, FEF 25-75% 1.26/80% Allergy labs 09/02/16- eosinophils 200, total IgE 4230 causing elevation of all specific allergen antibody levels including Aspergillus. Nucala  + 2018 PFT-10/13/16-minimal obstruction without response to dilator, minimal reduction of diffusion. FVC 2.02/77%, FEV1 1.62/83%, ratio 0.80, TLC 92%, DLCO 77% CT soft tissue neck 09/22/16-1. 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 Subsegmental atelectasis and nonspecific linear patchy densities in the lungs as described. They have a benign appearance.  Small hyperdense masses in the mediastinum are stable compared with 2015 supporting benign etiology such as ectopic thyroid tissue. Three-vessel prominent coronary artery calcification. Bibasilar bronchiolectasis. Labs 09/20/2017-WBC 14,900, hemoglobin 11.2, eosinophils absent, lymphocytes low( ?steroids or viral?), BNP 220, CMET wnl IgE 04/12/2018- 2,574, 10/16/16- 4,302, 11/09/13- 3,513 Office Spirometry 04/12/2018-mild restriction of exhaled volume.  Possible mild obstructive airways.  FVC 1.9/73%, FEV1 1.4/75%, ratio 1.76, FEF 25-75% 1.2/80%  -------------------------------------------------------------------------------------------  02/08/20- 80 year old female never smoker followed for OSA, allergic rhinitis, asthma/ Nucala, Hyper IgE, complicated by PA. Fib/Coumadin, HBP, GERD, obesity Neb Pulmicort/  Duoneb   Anoro, Flonase, Zyrtec, Singulair,  CPAP 9/[facility]-supplies, machine-Adapt Download- pending Body weight today- 199 lbs Covid vax- 3 Moderna Flu vax- had Follows with cardiology- EF 60-65%, LVH, Gr2DD, LAE, Nuclear stress test WNL Stable use of CPAP. Sneezing and  stuffy nose, esp L nostril, all summer. Using saline rinse and flonase. Easy DOE persists. Can walk about 6 minutes at a time- trying to extend this. Agrees overall she is doing better and with few exacerbations, compared to 1-2 years ago. Likes Anoro.  CXR 09/19/19- IMPRESSION: Mild cardiomegaly without focal airspace disease.  08/07/20- 80 year old female never smoker followed for OSA, allergic rhinitis, asthma/ Nucala, Hyper IgE, complicated by PA.Fib/Coumadin, HBP, GERD, obesity -Neb Pulmicort/  Duoneb   Anoro, Flonase, Zyrtec, Singulair,  CPAP 9/Adapt Download- compliance 97%, AHI 3/ hr Body weight today- 196 lbs Covid vax- 4 Moderna Pending Cardiac ablation for PAFib. Today is NSR. -----Patient feels good overall, she states she feels like her breathing may be a little worse since last visit but she is starting to get out more and walk more and with the pollen her allergies have been acting up and she thinks that is why. Sleeping good, machine working good. ACT score  16 Avoids Duoneb nebulizer unless really tight- causes tremor. Daily productive cough on first waking- white. Trelegy caused thrush. Gets patient assistance for Anoro. We discussed trying Judithann Sauger- could use a spacer if needed. No major exacerbations and overall much better than she was a year or two ago.   ROS-see HPI   + = positive Constitutional:   No-   weight loss, night sweats, fevers, chills, fatigue, lassitude. HEENT:   No-  headaches, difficulty swallowing, tooth/dental problems, sore throat,       No-  sneezing, itching,  ear ache,  +nasal congestion, post nasal drip,  CV:  No-   chest pain, orthopnea, PND, swelling in lower extremities, anasarca, dizziness, palpitations Resp: +  shortness of breath with exertion or at rest.                +productive cough,  + non-productive cough,  No- coughing up of blood.              No-change in color of  mucus. + wheezing.   Skin: No-   rash or lesions. GI:  No-    heartburn, indigestion, abdominal pain, nausea, vomiting, GU:  MS:  No-   joint pain or swelling. . Neuro-     nothing unusual Psych:  No- change in mood or affect. No depression or anxiety.  No memory loss.  OBJ- Physical Exam General- Alert, Oriented, Affect-appropriate, Distress- none acute, + obese Skin- rash-none, lesions- none, excoriation- none Lymphadenopathy- none Head- atraumatic            Eyes- Gross vision intact, PERRLA, conjunctivae and secretions clear            Ears- Hearing, canals-normal            Nose- +turbinate edema, no-Septal dev, mucus, polyps, erosion, perforation             Throat- Mallampati III-IV , mucosa- not red,  drainage- none, tonsils- atrophic Neck- flexible , trachea midline, no stridor , thyroid nl, carotid no bruit Chest - symmetrical excursion , unlabored           Heart/CV- RR/ very faint (no pacemaker) , no murmur , no gallop  , no rub, nl  s1 s2                  - JVD- none , edema- none, stasis changes- none, varices- none           Lung- + clear  Wheeze-none , cough-none,                         dullness-none, rub- none           Chest wall-  Abd- Br/ Gen/ Rectal- Not done, not indicated Extrem- cyanosis- none, clubbing, none, atrophy- none, strength- nl Neuro- grossly intact to observation

## 2020-08-06 NOTE — Telephone Encounter (Signed)
Will call patient back. She is not home right now

## 2020-08-06 NOTE — Telephone Encounter (Signed)
Assisted patient with set up for ablation, labs and covid screening. Will meet with the patient on lab day to give instructions for ablation and CT. Also to give Xarelto samples. Patient verbalized understanding.

## 2020-08-06 NOTE — Telephone Encounter (Signed)
-----   Message from Thompson Grayer, MD sent at 08/02/2020  9:50 AM EDT ----- Please schedule afib ablation  C/I/A  Cardiac CT    She is on coumadin.  We will plan to switch to xarelto 15mg  daily (CrCl 36) 3 weeks prior to ablation.  She will require samples.

## 2020-08-07 ENCOUNTER — Ambulatory Visit (INDEPENDENT_AMBULATORY_CARE_PROVIDER_SITE_OTHER): Payer: Medicare Other | Admitting: Internal Medicine

## 2020-08-07 ENCOUNTER — Telehealth: Payer: Self-pay | Admitting: Internal Medicine

## 2020-08-07 ENCOUNTER — Encounter: Payer: Self-pay | Admitting: Internal Medicine

## 2020-08-07 ENCOUNTER — Other Ambulatory Visit: Payer: Self-pay

## 2020-08-07 DIAGNOSIS — J4520 Mild intermittent asthma, uncomplicated: Secondary | ICD-10-CM | POA: Diagnosis not present

## 2020-08-07 DIAGNOSIS — G4733 Obstructive sleep apnea (adult) (pediatric): Secondary | ICD-10-CM | POA: Diagnosis not present

## 2020-08-07 MED ORDER — BREZTRI AEROSPHERE 160-9-4.8 MCG/ACT IN AERO: 2.0000 | INHALATION_SPRAY | Freq: Two times a day (BID) | RESPIRATORY_TRACT | 0 refills | Status: DC

## 2020-08-07 NOTE — Telephone Encounter (Signed)
Left detailed message per DPR that her coivd screening can not be an another location as discussed yesterday.

## 2020-08-07 NOTE — Telephone Encounter (Signed)
New message    Pt would like to know if there is some where closer to [facility] she can have covid test before procedure on 06.16.22

## 2020-08-07 NOTE — Patient Instructions (Signed)
Order- Sample x 2 Breztri inhaler   Inhale 2 puffs then rinse mouth, twice daily Try this instead of Anoro for comparison. If you like it better. please let us know.  We can continue CPAP 9  Please call if we can help

## 2020-08-08 ENCOUNTER — Encounter: Payer: Self-pay | Admitting: Internal Medicine

## 2020-08-08 NOTE — Assessment & Plan Note (Signed)
Mild seasonal exacerbation- may be due to current heavy pollen as she suggests Plan- trial of Breztri samples to compare with Anoro. Likely would need manufacturer's assistance and a spacer tube if she were to stay on Breztri.

## 2020-08-08 NOTE — Assessment & Plan Note (Signed)
Benefits from CPAP- comfortable and compliant Plan- continue CPAP 9. Consider change to auto when eligible to replace.

## 2020-08-19 ENCOUNTER — Telehealth: Payer: Self-pay | Admitting: Internal Medicine

## 2020-08-19 DIAGNOSIS — Z299 Encounter for prophylactic measures, unspecified: Secondary | ICD-10-CM | POA: Diagnosis not present

## 2020-08-19 DIAGNOSIS — J449 Chronic obstructive pulmonary disease, unspecified: Secondary | ICD-10-CM | POA: Diagnosis not present

## 2020-08-19 DIAGNOSIS — L0291 Cutaneous abscess, unspecified: Secondary | ICD-10-CM | POA: Diagnosis not present

## 2020-08-19 DIAGNOSIS — Z6835 Body mass index (BMI) 35.0-35.9, adult: Secondary | ICD-10-CM | POA: Diagnosis not present

## 2020-08-19 DIAGNOSIS — M26649 Arthritis of unspecified temporomandibular joint: Secondary | ICD-10-CM | POA: Diagnosis not present

## 2020-08-19 DIAGNOSIS — I10 Essential (primary) hypertension: Secondary | ICD-10-CM | POA: Diagnosis not present

## 2020-08-19 DIAGNOSIS — I4891 Unspecified atrial fibrillation: Secondary | ICD-10-CM | POA: Diagnosis not present

## 2020-08-19 NOTE — Telephone Encounter (Signed)
   Pt c/o medication issue:  1. Name of Medication: doxycycline  2. How are you currently taking this medication (dosage and times per day)?   3. Are you having a reaction (difficulty breathing--STAT)?   4. What is your medication issue? Pt said, she saw Dr. Manuella Ghazi today and she have abscess on her thigh again and prescribed her doxycycline, she wanted to speak with Otila Kluver to discuss about her appt on 05/25, she also said that Dr. Manuella Ghazi checked her INR and it was 3.3 and ask not to take her coumadin today

## 2020-08-19 NOTE — Telephone Encounter (Addendum)
Okay to still come in for labs and meet with me on 5/25. Will look at abscess on Wednesday and review with Dr. Rayann Heman for upcoming procedure.

## 2020-08-21 ENCOUNTER — Telehealth: Payer: Self-pay | Admitting: Internal Medicine

## 2020-08-21 ENCOUNTER — Other Ambulatory Visit: Payer: Medicare Other | Admitting: *Deleted

## 2020-08-21 ENCOUNTER — Other Ambulatory Visit: Payer: Self-pay

## 2020-08-21 ENCOUNTER — Ambulatory Visit (INDEPENDENT_AMBULATORY_CARE_PROVIDER_SITE_OTHER): Payer: Medicare Other

## 2020-08-21 VITALS — BP 142/69 | HR 72 | Temp 98.1°F | Resp 20

## 2020-08-21 DIAGNOSIS — I4891 Unspecified atrial fibrillation: Secondary | ICD-10-CM

## 2020-08-21 DIAGNOSIS — J4520 Mild intermittent asthma, uncomplicated: Secondary | ICD-10-CM | POA: Diagnosis not present

## 2020-08-21 LAB — CBC WITH DIFFERENTIAL/PLATELET
Basophils Absolute: 0.1 10*3/uL (ref 0.0–0.2)
Basos: 1 %
EOS (ABSOLUTE): 0.1 10*3/uL (ref 0.0–0.4)
Eos: 1 %
Hematocrit: 34.2 % (ref 34.0–46.6)
Hemoglobin: 10.7 g/dL — ABNORMAL LOW (ref 11.1–15.9)
Immature Grans (Abs): 0.1 10*3/uL (ref 0.0–0.1)
Immature Granulocytes: 1 %
Lymphocytes Absolute: 1.7 10*3/uL (ref 0.7–3.1)
Lymphs: 22 %
MCH: 25.9 pg — ABNORMAL LOW (ref 26.6–33.0)
MCHC: 31.3 g/dL — ABNORMAL LOW (ref 31.5–35.7)
MCV: 83 fL (ref 79–97)
Monocytes Absolute: 1.2 10*3/uL — ABNORMAL HIGH (ref 0.1–0.9)
Monocytes: 16 %
Neutrophils Absolute: 4.7 10*3/uL (ref 1.4–7.0)
Neutrophils: 59 %
Platelets: 214 10*3/uL (ref 150–450)
RBC: 4.13 x10E6/uL (ref 3.77–5.28)
RDW: 14.1 % (ref 11.7–15.4)
WBC: 7.9 10*3/uL (ref 3.4–10.8)

## 2020-08-21 LAB — BASIC METABOLIC PANEL
BUN/Creatinine Ratio: 18 (ref 12–28)
BUN: 18 mg/dL (ref 8–27)
CO2: 21 mmol/L (ref 20–29)
Calcium: 8.6 mg/dL — ABNORMAL LOW (ref 8.7–10.3)
Chloride: 105 mmol/L (ref 96–106)
Creatinine, Ser: 1.02 mg/dL — ABNORMAL HIGH (ref 0.57–1.00)
Glucose: 67 mg/dL (ref 65–99)
Potassium: 4.2 mmol/L (ref 3.5–5.2)
Sodium: 141 mmol/L (ref 134–144)
eGFR: 56 mL/min/{1.73_m2} — ABNORMAL LOW (ref >59)

## 2020-08-21 MED ORDER — FAMOTIDINE IN NACL 20-0.9 MG/50ML-% IV SOLN: 20.0000 mg | Freq: Once | INTRAVENOUS | Status: DC | PRN

## 2020-08-21 MED ORDER — DIPHENHYDRAMINE HCL 50 MG/ML IJ SOLN: 50.0000 mg | Freq: Once | INTRAMUSCULAR | Status: DC | PRN

## 2020-08-21 MED ORDER — METHYLPREDNISOLONE SODIUM SUCC 125 MG IJ SOLR: 125.0000 mg | Freq: Once | INTRAMUSCULAR | Status: DC | PRN

## 2020-08-21 MED ORDER — MEPOLIZUMAB 100 MG/ML ~~LOC~~ SOSY
100.0000 mg | PREFILLED_SYRINGE | Freq: Once | SUBCUTANEOUS | Status: AC
  Administered 2020-08-21: 100 mg via SUBCUTANEOUS
  Filled 2020-08-21: qty 1

## 2020-08-21 MED ORDER — EPINEPHRINE 0.3 MG/0.3ML IJ SOAJ: 0.3000 mg | Freq: Once | INTRAMUSCULAR | Status: DC | PRN

## 2020-08-21 MED ORDER — SODIUM CHLORIDE 0.9 % IV SOLN: Freq: Once | INTRAVENOUS | Status: DC | PRN

## 2020-08-21 MED ORDER — ALBUTEROL SULFATE HFA 108 (90 BASE) MCG/ACT IN AERS: 2.0000 | INHALATION_SPRAY | Freq: Once | RESPIRATORY_TRACT | Status: DC | PRN

## 2020-08-21 NOTE — Telephone Encounter (Signed)
Spoke with pt and confirmed [facility] was the preferred pt pharmacy. Verified it was also pharmacy listed in [place]. Nothing further needed at this time.

## 2020-08-21 NOTE — Progress Notes (Signed)
Diagnosis: Asthma  Provider:  Praveen Mannam, MD  Procedure: Injection  Nucala (Mepolizumab), Dose: 100 mg, Site: subcutaneous  Discharge: Condition: Good, Destination: Home . AVS provided to patient.   Performed by:  Zaide Mcclenahan, RN        

## 2020-08-21 NOTE — Addendum Note (Signed)
Addended by: Darrell Jewel on: 08/21/2020 01:14 PM   Modules accepted: Orders

## 2020-08-23 ENCOUNTER — Ambulatory Visit: Payer: Medicare Other | Admitting: Podiatry

## 2020-08-27 ENCOUNTER — Other Ambulatory Visit (HOSPITAL_COMMUNITY): Payer: Self-pay | Admitting: *Deleted

## 2020-08-27 DIAGNOSIS — I10 Essential (primary) hypertension: Secondary | ICD-10-CM | POA: Diagnosis not present

## 2020-08-27 MED ORDER — DOFETILIDE 500 MCG PO CAPS: 500.0000 ug | ORAL_CAPSULE | Freq: Two times a day (BID) | ORAL | 1 refills | Status: DC

## 2020-08-29 ENCOUNTER — Other Ambulatory Visit (HOSPITAL_COMMUNITY): Payer: Self-pay

## 2020-08-29 MED ORDER — POTASSIUM CHLORIDE CRYS ER 20 MEQ PO TBCR: 20.0000 meq | EXTENDED_RELEASE_TABLET | Freq: Every day | ORAL | 2 refills | Status: DC

## 2020-08-30 DIAGNOSIS — M546 Pain in thoracic spine: Secondary | ICD-10-CM | POA: Diagnosis not present

## 2020-08-30 DIAGNOSIS — M542 Cervicalgia: Secondary | ICD-10-CM | POA: Diagnosis not present

## 2020-08-30 DIAGNOSIS — M48061 Spinal stenosis, lumbar region without neurogenic claudication: Secondary | ICD-10-CM | POA: Diagnosis not present

## 2020-08-30 DIAGNOSIS — M26602 Left temporomandibular joint disorder, unspecified: Secondary | ICD-10-CM | POA: Diagnosis not present

## 2020-08-30 DIAGNOSIS — M9901 Segmental and somatic dysfunction of cervical region: Secondary | ICD-10-CM | POA: Diagnosis not present

## 2020-08-30 DIAGNOSIS — M9903 Segmental and somatic dysfunction of lumbar region: Secondary | ICD-10-CM | POA: Diagnosis not present

## 2020-08-30 DIAGNOSIS — M9902 Segmental and somatic dysfunction of thoracic region: Secondary | ICD-10-CM | POA: Diagnosis not present

## 2020-08-30 DIAGNOSIS — M9905 Segmental and somatic dysfunction of pelvic region: Secondary | ICD-10-CM | POA: Diagnosis not present

## 2020-09-02 ENCOUNTER — Other Ambulatory Visit: Payer: Self-pay

## 2020-09-02 ENCOUNTER — Ambulatory Visit (INDEPENDENT_AMBULATORY_CARE_PROVIDER_SITE_OTHER): Payer: Medicare Other | Admitting: Podiatry

## 2020-09-02 ENCOUNTER — Encounter: Payer: Self-pay | Admitting: Podiatry

## 2020-09-02 DIAGNOSIS — W57XXXA Bitten or stung by nonvenomous insect and other nonvenomous arthropods, initial encounter: Secondary | ICD-10-CM

## 2020-09-02 DIAGNOSIS — M79675 Pain in left toe(s): Secondary | ICD-10-CM | POA: Diagnosis not present

## 2020-09-02 DIAGNOSIS — B351 Tinea unguium: Secondary | ICD-10-CM

## 2020-09-02 DIAGNOSIS — S90861A Insect bite (nonvenomous), right foot, initial encounter: Secondary | ICD-10-CM

## 2020-09-02 DIAGNOSIS — M79674 Pain in right toe(s): Secondary | ICD-10-CM | POA: Diagnosis not present

## 2020-09-02 MED ORDER — DOXYCYCLINE HYCLATE 100 MG PO CAPS: 100.0000 mg | ORAL_CAPSULE | Freq: Two times a day (BID) | ORAL | 0 refills | Status: AC

## 2020-09-03 NOTE — Telephone Encounter (Signed)
Spoke with the pt and advised her to continue her antibiotic it is best not to have an infection when having a procedure but I will forward to Dr. Faythe Ghee for review.   Pt reminded to be NPO morning of her Ablation.

## 2020-09-03 NOTE — Progress Notes (Signed)
Cardiology Office Note  Date: 09/04/2020   ID: [name] [name], DOB 07-06-1940, MRN [id number]  PCP:  Monico Blitz, MD  Cardiologist:  Rozann Lesches, MD Electrophysiologist:  Thompson Grayer, MD   Chief Complaint  Patient presents with  . Cardiac follow-up    History of Present Illness: [name] [name] is an 80 y.o. female last seen in December 2021 by Mr. Leonides Sake NP, and more recently with interval follow-up in the atrial fibrillation clinic and with Dr. Rayann Heman.  She is scheduled for follow-up atrial fibrillation ablation next week.  I reviewed her home vital sign checks, still with intermittent episodes of atrial fibrillation on average once a week.  No recent changes in her medications except that she is now on Xarelto instead of Coumadin, anticipates this at least 3 months after ablation.  She is concerned about the high cost and lack of insurance coverage and would like to eventually go back to Coumadin however.  I personally reviewed her ECG which shows sinus bradycardia with significantly prolonged PR interval.  Past Medical History:  Diagnosis Date  . Allergic rhinitis   . Anxiety   . Asthma   . Atrial flutter ([place])   . COPD (chronic obstructive pulmonary disease) ([place])   . Depression   . Essential hypertension   . Hyperlipidemia   . Lumbar disc disease   . Obesity   . OSA (obstructive sleep apnea)    CPAP  . Paroxysmal atrial fibrillation (H[facility])    Element of tachycardia bradycardia syndrome  . Respiratory failure [facility])     Past Surgical History:  Procedure Laterality Date  . ANTERIOR FUSION LUMBAR SPINE    . BIOPSY  08/27/2016   Procedure: BIOPSY;  Surgeon: Rogene Houston, MD;  Location: [facility] END[facility] SU[facility];  Service: Endoscopy;;  FOUR GASTRIC POLYPS BIOPSIED  . CARDIOVERSION N/A 03/28/2014   Procedure: CARDIOVERSION;  Surgeon: Fay Records, MD;  Location: [facility] ORS;  Service: Cardiovascular;  Laterality: N/A;  . CARDIOVERSION N/A 10/22/2016   Procedure:  CARDIOVERSION;  Surgeon: Sueanne Margarita, MD;  Location: [facility] ENDOSCOPY;  Service: Cardiovascular;  Laterality: N/A;  . CATARACT EXTRACTION    . COLONOSCOPY N/A 08/04/2012   Procedure: COLONOSCOPY;  Surgeon: Rogene Houston, MD;  Location: [facility];  Service: Endoscopy;  Laterality: N/A;  730-rescheduled to [place] notified pt  . ELECTROPHYSIOLOGIC STUDY N/A 09/18/2014   Procedure: Atrial Fibrillation Ablation;  Surgeon: Thompson Grayer, MD;  Location: [place] CV LAB;  Service: Cardiovascular;  Laterality: N/A;  . ESOPHAGOGASTRODUODENOSCOPY N/A 08/27/2016   Procedure: ESOPHAGOGASTRODUODENOSCOPY (EGD);  Surgeon: Rogene Houston, MD;  Location: [facility];  Service: Endoscopy;  Laterality: N/A;  1200  . LASIK     Both eyes  . TEE WITHOUT CARDIOVERSION N/A 09/18/2014   Procedure: TRANSESOPHAGEAL ECHOCARDIOGRAM (TEE);  Surgeon: Larey Dresser, MD;  Location: [place];  Service: Cardiovascular;  Laterality: N/A;  . TEE WITHOUT CARDIOVERSION N/A 10/22/2016   Procedure: TRANSESOPHAGEAL ECHOCARDIOGRAM (TEE);  Surgeon: Sueanne Margarita, MD;  Location: [facility] ENDOSCOPY;  Service: Cardiovascular;  Laterality: N/A;  . TEE WITHOUT CARDIOVERSION N/A 08/24/2017   Procedure: TRANSESOPHAGEAL ECHOCARDIOGRAM (TEE);  Surgeon: Josue Hector, MD;  Location: [facility] ENDOSCOPY;  Service: Cardiovascular;  Laterality: N/A;  . TOTAL HIP ARTHROPLASTY  2010   Right    Current Outpatient Medications  Medication Sig Dispense Refill  . acetaminophen (TYLENOL) 500 MG tablet Take 500 mg by mouth daily as needed for headache.    Marland Kitchen  antiseptic oral rinse (BIOTENE) LIQD 15 mLs by Mouth Rinse route as needed for dry mouth.    Marland Kitchen atorvastatin (LIPITOR) 10 MG tablet Take 10 mg by mouth every evening.     . benzonatate (TESSALON) 200 MG capsule Take 1 capsule (200 mg total) by mouth 3 (three) times daily as needed for cough. 30 capsule 3  . Biotin 1 MG CAPS Take 1,000 mg by mouth 3 (three) times a week.     .  Budeson-Glycopyrrol-Formoterol (BREZTRI AEROSPHERE) 160-9-4.8 MCG/ACT AERO Inhale 2 puffs into the lungs 2 (two) times daily. 10.7 g 0  . budesonide (PULMICORT) 0.5 MG/2ML nebulizer solution Take 0.5 mg by nebulization daily.     . BUDESONIDE PO USE ONE vial via NEBULIZER TWICE DAILY    . Calcium Carbonate-Vit D-Min (QC CALCIUM-MAGNESIUM-ZINC-D3) 333.4-133 MG-UNIT TABS Take 1 tablet by mouth 2 (two) times daily.     . cetirizine (ZYRTEC) 10 MG tablet Take 10 mg by mouth daily as needed for allergies.    . clindamycin (CLEOCIN) 300 MG capsule 2 capsules as needed. Take prior to dental procedures  0  . DERMA-SMOOTHE/FS SCALP 0.01 % OIL APPLY A SMALL AMOUNT TO SKIN THREE TIMES A WEEK, PART HAIR APPLY TO SCALP, COVER WITH PLASTIC CAP, Baker OUT IN THE MONING    . diltiazem (CARDIZEM CD) 120 MG 24 hr capsule TAKE ONE CAPSULE BY MOUTH AT BEDTIME 90 capsule 2  . diltiazem (CARDIZEM) 30 MG tablet TAKE 1 TABLET BY MOUTH AS NEEDED FOR PALPITATIONS 45 tablet 2  . dofetilide (TIKOSYN) 500 MCG capsule Take 1 capsule (500 mcg total) by mouth 2 (two) times daily. 180 capsule 1  . doxycycline (VIBRAMYCIN) 100 MG capsule Take 1 capsule (100 mg total) by mouth 2 (two) times daily for 14 days. 28 capsule 0  . EPINEPHrine 0.3 mg/0.3 mL IJ SOAJ injection Inject into the muscle.    . famotidine (PEPCID) 20 MG tablet Take 2 tablets (40 mg total) by mouth at bedtime as needed. 30 tablet 1  . fluticasone (FLONASE) 50 MCG/ACT nasal spray Place 1 spray into both nostrils 2 (two) times daily.    . hydrALAZINE (APRESOLINE) 100 MG tablet Take 100 mg by mouth 2 (two) times daily.    Marland Kitchen ipratropium-albuterol (DUONEB) 0.5-2.5 (3) MG/3ML SOLN Take 3 mLs by nebulization daily.     Marland Kitchen ketoconazole (NIZORAL) 2 % shampoo Apply topically.    . Magnesium 400 MG TABS Take 400 mg by mouth daily.    . montelukast (SINGULAIR) 10 MG tablet Take 10 mg by mouth at bedtime.    . pantoprazole (PROTONIX) 40 MG tablet Take 1 tablet (40 mg total) by  mouth in the morning. Take 40 mg by mouth in the morning. 90 tablet 3  . potassium chloride SA (KLOR-CON) 20 MEQ tablet Take 1 tablet (20 mEq total) by mouth daily. 90 tablet 2  . Rivaroxaban (XARELTO) 15 MG TABS tablet Take 1 tablet (15 mg total) by mouth daily with supper. 90 tablet 3  . Sodium Chloride-Sodium Bicarb (AYR SALINE NASAL NETI RINSE) 1.57 g PACK Place 1 each into the nose daily as needed (rinse nasal often).    . triamcinolone cream (KENALOG) 0.5 % SMARTSIG:Sparingly Topical Daily PRN    . triamcinolone ointment (KENALOG) 0.1 % Apply 1 application topically daily as needed for rash.  2  . umeclidinium-vilanterol (ANORO ELLIPTA) 62.5-25 MCG/INH AEPB INHALE ONE PUFF INTO THE LUNGS DAILY 60 each 11  . Wheat Dextrin (BENEFIBER ON THE GO)  POWD Take 1 Dose by mouth daily as needed (constipation). 2 tsp daily in the morning.    Marland Kitchen losartan (COZAAR) 100 MG tablet Take 1 tablet (100 mg total) by mouth daily. 90 tablet 2   Current Facility-Administered Medications  Medication Dose Route Frequency Provider Last Rate Last Admin  . Mepolizumab SOLR 100 mg  100 mg Subcutaneous Q28 days Baird Lyons D, MD   100 mg at 09/07/19 0947  . Mepolizumab SOLR 100 mg  100 mg Subcutaneous Q28 days Annamaria Boots, Clinton D, MD   100 mg at 11/02/19 1123   Allergies:  Bupropion hcl, Escitalopram oxalate, Fluticasone-salmeterol, Lisinopril, and Serevent   ROS: No sudden dizziness or syncope.  Physical Exam: VS:  BP (!) 138/56   Pulse (!) 53   Ht 5' 2.5" (1.588 m)   Wt 194 lb 9.6 oz (88.3 kg)   SpO2 98%   BMI 35.03 kg/m , BMI Body mass index is 35.03 kg/m.  Wt Readings from Last 3 Encounters:  09/04/20 194 lb 9.6 oz (88.3 kg)  08/07/20 196 lb 3.2 oz (89 kg)  08/02/20 196 lb 3.2 oz (89 kg)    General: Patient appears comfortable at rest. HEENT: Conjunctiva and lids normal, wearing a mask. Neck: Supple, no elevated JVP or carotid bruits, no thyromegaly. Lungs: Clear to auscultation, nonlabored breathing  at rest. Cardiac: Regular rate and rhythm, no S3, 1/6 systolic murmur, no pericardial rub. Extremities: No pitting edema.  ECG:  An ECG dated 05/27/2020 was personally reviewed today and demonstrated:  Sinus bradycardia.  Recent Labwork: 09/19/2019: ALT 17; AST 29 05/09/2020: Magnesium 2.3 08/21/2020: BUN 18; Creatinine, Ser 1.02; Hemoglobin 10.7; Platelets 214; Potassium 4.2; Sodium 141   Other Studies Reviewed Today:  Echocardiogram 09/20/2019: 1. Left ventricular ejection fraction, by estimation, is 60 to 65%. The  left ventricle has normal function. The left ventricle has no regional  wall motion abnormalities. There is moderate left ventricular hypertrophy.  Left ventricular diastolic  parameters are consistent with Grade II diastolic dysfunction  (pseudonormalization). Elevated left atrial pressure.  2. Right ventricular systolic function is normal. The right ventricular  size is normal. There is mildly elevated pulmonary artery systolic  pressure.  3. Left atrial size was severely dilated.  4. The mitral valve is normal in structure. Trivial mitral valve  regurgitation. No evidence of mitral stenosis.  5. The aortic valve is tricuspid. Aortic valve regurgitation is not  visualized. No aortic stenosis is present.  6. Indeterminant PASP, inadequate TR jet.  7. The inferior vena cava is dilated in size with >50% respiratory  variability, suggesting right atrial pressure of 8 mmHg.   Assessment and Plan:  1.  Persistent atrial fibrillation, CHA2DS2-VASc score is 4.  She is scheduled for atrial fibrillation ablation next week with Dr. Rayann Heman.  ECG reviewed.  Continue Cardizem CD, Tikosyn, and Xarelto.  2.  Acquired thrombophilia, currently on Xarelto 15 mg daily for stroke prophylaxis.  She was switched from Coumadin for anticipated 69-month course after ablation.  She is concerned about the cost however and lack of insurance coverage, would like to eventually go back to  Coumadin.  3.  Conduction system disease with sinus bradycardia.  Continuing to follow.  Medication Adjustments/Labs and Tests Ordered: Current medicines are reviewed at length with the patient today.  Concerns regarding medicines are outlined above.   Tests Ordered: Orders Placed This Encounter  Procedures  . EKG 12-Lead    Medication Changes: No orders of the defined types were placed in  this encounter.   Disposition:  Follow up 6 months.  Signed, Satira Sark, MD, [facility] 09/04/2020 [address] [address], [place], [place] [address] Phone: [phone]; Fax: [phone]

## 2020-09-03 NOTE — Telephone Encounter (Signed)
Patient is following up, requesting to speak with Otila Kluver, RN. She states she has been prescribed Doxycycline for what she assumes is a spider bite. She states it was written for her to take 1 tablet twice daily for 1 week. She would like to ensure this will not interfere with her ablation on 09/12/20. Please return call to discuss before 9:45 am or after 11:30 am. Patient states she is going to the pool at 10:00 am.

## 2020-09-04 ENCOUNTER — Telehealth (HOSPITAL_COMMUNITY): Payer: Self-pay | Admitting: Emergency Medicine

## 2020-09-04 ENCOUNTER — Encounter: Payer: Self-pay | Admitting: Cardiology

## 2020-09-04 ENCOUNTER — Other Ambulatory Visit: Payer: Self-pay | Admitting: *Deleted

## 2020-09-04 ENCOUNTER — Ambulatory Visit (INDEPENDENT_AMBULATORY_CARE_PROVIDER_SITE_OTHER): Payer: Medicare Other | Admitting: Cardiology

## 2020-09-04 VITALS — BP 138/56 | HR 53 | Ht 62.5 in | Wt 194.6 lb

## 2020-09-04 DIAGNOSIS — M9902 Segmental and somatic dysfunction of thoracic region: Secondary | ICD-10-CM | POA: Diagnosis not present

## 2020-09-04 DIAGNOSIS — I495 Sick sinus syndrome: Secondary | ICD-10-CM

## 2020-09-04 DIAGNOSIS — M26602 Left temporomandibular joint disorder, unspecified: Secondary | ICD-10-CM | POA: Diagnosis not present

## 2020-09-04 DIAGNOSIS — M9901 Segmental and somatic dysfunction of cervical region: Secondary | ICD-10-CM | POA: Diagnosis not present

## 2020-09-04 DIAGNOSIS — I4819 Other persistent atrial fibrillation: Secondary | ICD-10-CM

## 2020-09-04 DIAGNOSIS — M9905 Segmental and somatic dysfunction of pelvic region: Secondary | ICD-10-CM | POA: Diagnosis not present

## 2020-09-04 DIAGNOSIS — D6869 Other thrombophilia: Secondary | ICD-10-CM | POA: Diagnosis not present

## 2020-09-04 DIAGNOSIS — M546 Pain in thoracic spine: Secondary | ICD-10-CM | POA: Diagnosis not present

## 2020-09-04 DIAGNOSIS — M542 Cervicalgia: Secondary | ICD-10-CM | POA: Diagnosis not present

## 2020-09-04 DIAGNOSIS — M9903 Segmental and somatic dysfunction of lumbar region: Secondary | ICD-10-CM | POA: Diagnosis not present

## 2020-09-04 DIAGNOSIS — M48061 Spinal stenosis, lumbar region without neurogenic claudication: Secondary | ICD-10-CM | POA: Diagnosis not present

## 2020-09-04 MED ORDER — RIVAROXABAN 15 MG PO TABS: 15.0000 mg | ORAL_TABLET | Freq: Every day | ORAL | 0 refills | Status: DC

## 2020-09-04 MED ORDER — RIVAROXABAN 15 MG PO TABS
15.0000 mg | ORAL_TABLET | Freq: Every day | ORAL | 3 refills | Status: DC
Start: 2020-09-04 — End: 2021-04-28

## 2020-09-04 NOTE — Patient Instructions (Addendum)

## 2020-09-04 NOTE — Telephone Encounter (Signed)
Reaching out to patient to offer assistance regarding upcoming cardiac imaging study; pt verbalizes understanding of appt date/time, parking situation and where to check in, pre-test NPO status and medications ordered, and verified current allergies; name and call back number provided for further questions should they arise Latunya Kissick RN Navigator Cardiac Imaging [facility] [phone] office [phone] cell  Daily meds per usual 

## 2020-09-04 NOTE — Telephone Encounter (Addendum)
Left detailed message. Okay per DPR 

## 2020-09-04 NOTE — Telephone Encounter (Signed)
Attempted to call patient regarding upcoming cardiac CT appointment. °Left message on voicemail with name and callback number °Adriaan Maltese RN Navigator Cardiac Imaging °[facility] °[phone] Office °[phone] Cell ° °

## 2020-09-05 ENCOUNTER — Other Ambulatory Visit: Payer: Self-pay

## 2020-09-05 ENCOUNTER — Encounter: Payer: Self-pay | Admitting: *Deleted

## 2020-09-05 ENCOUNTER — Ambulatory Visit (HOSPITAL_COMMUNITY)
Admission: RE | Admit: 2020-09-05 | Discharge: 2020-09-05 | Disposition: A | Discharge status: Home / Self Care | Payer: Medicare Other | Source: Ambulatory Visit | Attending: Internal Medicine | Admitting: Internal Medicine

## 2020-09-05 DIAGNOSIS — I4891 Unspecified atrial fibrillation: Secondary | ICD-10-CM | POA: Diagnosis not present

## 2020-09-05 MED ORDER — IOHEXOL 350 MG/ML SOLN
80.0000 mL | Freq: Once | INTRAVENOUS | Status: AC | PRN
  Administered 2020-09-05: 80 mL via INTRAVENOUS

## 2020-09-05 MED ORDER — CHLORHEXIDINE GLUCONATE 0.12 % MT SOLN
OROMUCOSAL | Status: AC
  Filled 2020-09-05: qty 15

## 2020-09-05 NOTE — Progress Notes (Signed)
Fax notification received from [employer] that xarelto 15 mg approved for 1 year.

## 2020-09-06 NOTE — Progress Notes (Signed)
  Subjective:  Patient ID: [name] [name], female    DOB: March 20, 1941,  MRN: [id number]  [name] [name] presents to clinic today for painful thick toenails that are difficult to trim. Pain interferes with ambulation. Aggravating factors include wearing enclosed shoe gear. Pain is relieved with periodic professional debridement.  Patient states she thinks may have been bitten by a tick on her right foot last week. She has also pulled two  ticks off of her upper torso. [place]. She denies any h/o Lyme Disease. She does relate itching to the right foot. Denies any fever, chlls, night sweats, nausea or vomiting.  She states she will be having ablation performed for her a-fib on 09/12/2020.  Allergies  Allergen Reactions   Bupropion Hcl Other (See Comments)    Suicidal Thoughts.    Escitalopram Oxalate Other (See Comments)    Suicidal Thoughts.    Fluticasone-Salmeterol Other (See Comments)    Advair - Caused patient to go into Afib.    Lisinopril Cough   Serevent Other (See Comments)    Caused patient to go into Afib    Review of Systems: Negative except as noted in the HPI. Objective:   Constitutional [name] [name] is a pleasant 80 y.o. Caucasian female, morbidly obese in NAD. AAO x 3.   Vascular Capillary refill time to digits immediate b/l. Palpable DP pulse(s) b/l lower extremities Palpable PT pulse(s) b/l lower extremities Lower extremity skin temperature gradient within normal limits. No pain with calf compression b/l. Trace edema noted right foot. No cyanosis or clubbing noted.  Neurologic Normal speech. Oriented to person, place, and time. Protective sensation intact 5/5 intact bilaterally with 10g monofilament b/l.  Dermatologic Pedal skin with normal turgor, texture and tone bilaterally. No interdigital macerations bilaterally. Toenails 1-5 b/l elongated, discolored, dystrophic, thickened, crumbly with  subungual debris and tenderness to dorsal palpation. Patient has pinpoint puncture wound resembling insect bite on dorsolateral aspect of right foot.  Raised with mild erythema. +Pain on palpation. No abscess formation. No bullseye formation.  Orthopedic: Normal muscle strength 5/5 to all lower extremity muscle groups bilaterally. No pain crepitus or joint limitation noted with ROM b/l. Hammertoe(s) noted to the b/l feet. Pes planus deformity noted b/l.    Radiographs: None Assessment:   1. Pain due to onychomycosis of toenails of both feet   2. Tick bite of right foot, initial encounter    Plan:  Patient was evaluated and treated and all questions answered.  Onychomycosis with pain -Nails palliatively debridement as below -Educated on self-care  Procedure: Nail Debridement Rationale: Pain Type of Debridement: manual, sharp debridement. Instrumentation: Nail nipper, rotary burr. Number of Nails: 10 -Examined patient. -Patient to continue soft, supportive shoe gear daily. -Toenails 1-5 b/l were debrided in length and girth with sterile nail nippers and dremel without iatrogenic bleeding.  -Patient to report any pedal injuries to medical professional immediately. -Given patient's history, for suspected tick bite, Rx for Doxycyline 100 mg, #28, to be taken one capsule twice daily for 14 days. -Patient/POA to call should there be question/concern in the interim.  Return in about 9 weeks (around 11/04/2020).  Marzetta Board, DPM

## 2020-09-09 DIAGNOSIS — M9902 Segmental and somatic dysfunction of thoracic region: Secondary | ICD-10-CM | POA: Diagnosis not present

## 2020-09-09 DIAGNOSIS — M26602 Left temporomandibular joint disorder, unspecified: Secondary | ICD-10-CM | POA: Diagnosis not present

## 2020-09-09 DIAGNOSIS — M546 Pain in thoracic spine: Secondary | ICD-10-CM | POA: Diagnosis not present

## 2020-09-09 DIAGNOSIS — M542 Cervicalgia: Secondary | ICD-10-CM | POA: Diagnosis not present

## 2020-09-09 DIAGNOSIS — M48061 Spinal stenosis, lumbar region without neurogenic claudication: Secondary | ICD-10-CM | POA: Diagnosis not present

## 2020-09-09 DIAGNOSIS — M9905 Segmental and somatic dysfunction of pelvic region: Secondary | ICD-10-CM | POA: Diagnosis not present

## 2020-09-09 DIAGNOSIS — M9901 Segmental and somatic dysfunction of cervical region: Secondary | ICD-10-CM | POA: Diagnosis not present

## 2020-09-09 DIAGNOSIS — M9903 Segmental and somatic dysfunction of lumbar region: Secondary | ICD-10-CM | POA: Diagnosis not present

## 2020-09-10 ENCOUNTER — Other Ambulatory Visit (HOSPITAL_COMMUNITY): Payer: Medicare Other

## 2020-09-11 ENCOUNTER — Telehealth: Payer: Self-pay | Admitting: Internal Medicine

## 2020-09-11 NOTE — Pre-Procedure Instructions (Signed)
Instructed patient on the following items: Arrival time 0530 Nothing to eat or drink after midnight No meds AM of procedure Responsible person to drive you home and stay with you for 24 hrs  Have you missed any doses of anti-coagulant Xarelto- hasn't missed any doses    

## 2020-09-11 NOTE — Anesthesia Preprocedure Evaluation (Addendum)
Anesthesia Evaluation  Patient identified by MRN, date of birth, ID band Patient awake    Reviewed: Allergy & Precautions, NPO status , Patient's Chart, lab work & pertinent test results  History of Anesthesia Complications Negative for: history of anesthetic complications  Airway Mallampati: III  TM Distance: >3 FB Neck ROM: Full   Comment: TMJ arthritis Dental  (+) Dental Advisory Given   Pulmonary shortness of breath, sleep apnea and Continuous Positive Airway Pressure Ventilation , COPD,  COPD inhaler,    breath sounds clear to auscultation       Cardiovascular hypertension, Pt. on medications (-) angina+ dysrhythmias Atrial Fibrillation  Rhythm:Irregular Rate:Normal  09/19/2020 ECHO: EF 60-65%. The LV has normal function, no regional wall motion abnormalities. There is moderate LVH. Grade II DD, no significant valvular abnormalities   Neuro/Psych Anxiety Depression negative neurological ROS     GI/Hepatic Neg liver ROS, GERD  Medicated and Poorly Controlled,  Endo/Other  Morbid obesity  Renal/GU negative Renal ROS     Musculoskeletal   Abdominal (+) + obese,   Peds  Hematology xarelto   Anesthesia Other Findings   Reproductive/Obstetrics                            Anesthesia Physical Anesthesia Plan  ASA: 3  Anesthesia Plan: General   Post-op Pain Management:    Induction: Intravenous and Rapid sequence  PONV Risk Score and Plan: 3 and Ondansetron and Dexamethasone  Airway Management Planned: Oral ETT  Additional Equipment: None  Intra-op Plan:   Post-operative Plan: Extubation in OR  Informed Consent: I have reviewed the patients History and Physical, chart, labs and discussed the procedure including the risks, benefits and alternatives for the proposed anesthesia with the patient or authorized representative who has indicated his/her understanding and acceptance.      Dental advisory given  Plan Discussed with: CRNA and Surgeon  Anesthesia Plan Comments:        Anesthesia Quick Evaluation

## 2020-09-11 NOTE — Telephone Encounter (Signed)
Patient is following up regarding 09/12/20 ablation with JA.  She said earlier in the week she provided RN with the name of an emergency contact for the procedure only. She said she would like to replace that person with her niece, Dixon Boos ([phone]). Per patient, the other person does not want to be contact in the case of an emergency.

## 2020-09-12 ENCOUNTER — Ambulatory Visit (HOSPITAL_COMMUNITY): Payer: Medicare Other | Admitting: Anesthesiology

## 2020-09-12 ENCOUNTER — Ambulatory Visit (HOSPITAL_COMMUNITY)
Admission: RE | Admit: 2020-09-12 | Discharge: 2020-09-12 | Disposition: A | Discharge status: Home / Self Care | Payer: Medicare Other | Attending: Internal Medicine | Admitting: Internal Medicine

## 2020-09-12 ENCOUNTER — Encounter (HOSPITAL_COMMUNITY): Payer: Self-pay | Admitting: Internal Medicine

## 2020-09-12 ENCOUNTER — Encounter (HOSPITAL_COMMUNITY)
Admission: RE | Disposition: A | Discharge status: Home / Self Care | Payer: Self-pay | Source: Home / Self Care | Attending: Internal Medicine

## 2020-09-12 ENCOUNTER — Other Ambulatory Visit: Payer: Self-pay

## 2020-09-12 DIAGNOSIS — Z7951 Long term (current) use of inhaled steroids: Secondary | ICD-10-CM | POA: Diagnosis not present

## 2020-09-12 DIAGNOSIS — Z9989 Dependence on other enabling machines and devices: Secondary | ICD-10-CM | POA: Diagnosis not present

## 2020-09-12 DIAGNOSIS — J449 Chronic obstructive pulmonary disease, unspecified: Secondary | ICD-10-CM | POA: Diagnosis not present

## 2020-09-12 DIAGNOSIS — I498 Other specified cardiac arrhythmias: Secondary | ICD-10-CM | POA: Insufficient documentation

## 2020-09-12 DIAGNOSIS — E669 Obesity, unspecified: Secondary | ICD-10-CM | POA: Diagnosis not present

## 2020-09-12 DIAGNOSIS — Z96641 Presence of right artificial hip joint: Secondary | ICD-10-CM | POA: Diagnosis not present

## 2020-09-12 DIAGNOSIS — I4892 Unspecified atrial flutter: Secondary | ICD-10-CM | POA: Diagnosis not present

## 2020-09-12 DIAGNOSIS — E785 Hyperlipidemia, unspecified: Secondary | ICD-10-CM | POA: Insufficient documentation

## 2020-09-12 DIAGNOSIS — I10 Essential (primary) hypertension: Secondary | ICD-10-CM | POA: Insufficient documentation

## 2020-09-12 DIAGNOSIS — Z79899 Other long term (current) drug therapy: Secondary | ICD-10-CM | POA: Insufficient documentation

## 2020-09-12 DIAGNOSIS — G4733 Obstructive sleep apnea (adult) (pediatric): Secondary | ICD-10-CM | POA: Insufficient documentation

## 2020-09-12 DIAGNOSIS — Z7901 Long term (current) use of anticoagulants: Secondary | ICD-10-CM | POA: Diagnosis not present

## 2020-09-12 DIAGNOSIS — Z6834 Body mass index (BMI) 34.0-34.9, adult: Secondary | ICD-10-CM | POA: Insufficient documentation

## 2020-09-12 DIAGNOSIS — I480 Paroxysmal atrial fibrillation: Secondary | ICD-10-CM | POA: Diagnosis not present

## 2020-09-12 DIAGNOSIS — I4819 Other persistent atrial fibrillation: Secondary | ICD-10-CM | POA: Diagnosis not present

## 2020-09-12 HISTORY — PX: ATRIAL FIBRILLATION ABLATION: EP1191

## 2020-09-12 LAB — POCT ACTIVATED CLOTTING TIME
Activated Clotting Time: 323 seconds
Activated Clotting Time: 329 seconds

## 2020-09-12 SURGERY — ATRIAL FIBRILLATION ABLATION
Anesthesia: General

## 2020-09-12 MED ORDER — HEPARIN (PORCINE) IN NACL 1000-0.9 UT/500ML-% IV SOLN
INTRAVENOUS | Status: AC
  Filled 2020-09-12: qty 500

## 2020-09-12 MED ORDER — LIDOCAINE 2% (20 MG/ML) 5 ML SYRINGE
INTRAMUSCULAR | Status: DC | PRN
  Administered 2020-09-12: 10 mg via INTRAVENOUS

## 2020-09-12 MED ORDER — SODIUM CHLORIDE 0.9 % IV SOLN: INTRAVENOUS | Status: DC

## 2020-09-12 MED ORDER — SODIUM CHLORIDE 0.9% FLUSH: 3.0000 mL | INTRAVENOUS | Status: DC | PRN

## 2020-09-12 MED ORDER — ONDANSETRON HCL 4 MG/2ML IJ SOLN: 4.0000 mg | Freq: Four times a day (QID) | INTRAMUSCULAR | Status: DC | PRN

## 2020-09-12 MED ORDER — ONDANSETRON HCL 4 MG/2ML IJ SOLN
INTRAMUSCULAR | Status: DC | PRN
  Administered 2020-09-12: 4 mg via INTRAVENOUS

## 2020-09-12 MED ORDER — HEPARIN SODIUM (PORCINE) 1000 UNIT/ML IJ SOLN
INTRAMUSCULAR | Status: DC | PRN
  Administered 2020-09-12: 1000 [IU] via INTRAVENOUS

## 2020-09-12 MED ORDER — SUCCINYLCHOLINE CHLORIDE 200 MG/10ML IV SOSY
PREFILLED_SYRINGE | INTRAVENOUS | Status: DC | PRN
  Administered 2020-09-12: 140 mg via INTRAVENOUS

## 2020-09-12 MED ORDER — ACETAMINOPHEN 325 MG PO TABS
650.0000 mg | ORAL_TABLET | ORAL | Status: DC | PRN
  Filled 2020-09-12: qty 2

## 2020-09-12 MED ORDER — DEXAMETHASONE SODIUM PHOSPHATE 10 MG/ML IJ SOLN
INTRAMUSCULAR | Status: DC | PRN
  Administered 2020-09-12: 4 mg via INTRAVENOUS

## 2020-09-12 MED ORDER — ROCURONIUM BROMIDE 10 MG/ML (PF) SYRINGE
PREFILLED_SYRINGE | INTRAVENOUS | Status: DC | PRN
  Administered 2020-09-12: 40 mg via INTRAVENOUS

## 2020-09-12 MED ORDER — FENTANYL CITRATE (PF) 100 MCG/2ML IJ SOLN
25.0000 ug | INTRAMUSCULAR | Status: DC | PRN
  Administered 2020-09-12: 25 ug via INTRAVENOUS

## 2020-09-12 MED ORDER — PROPOFOL 10 MG/ML IV BOLUS
INTRAVENOUS | Status: DC | PRN
  Administered 2020-09-12: 20 mg via INTRAVENOUS
  Administered 2020-09-12: 150 mg via INTRAVENOUS

## 2020-09-12 MED ORDER — ACETAMINOPHEN 500 MG PO TABS
1000.0000 mg | ORAL_TABLET | Freq: Once | ORAL | Status: AC
  Administered 2020-09-12: 1000 mg via ORAL
  Filled 2020-09-12 (×2): qty 2

## 2020-09-12 MED ORDER — SUGAMMADEX SODIUM 200 MG/2ML IV SOLN
INTRAVENOUS | Status: DC | PRN
  Administered 2020-09-12: 200 mg via INTRAVENOUS

## 2020-09-12 MED ORDER — HEPARIN (PORCINE) IN NACL 1000-0.9 UT/500ML-% IV SOLN
INTRAVENOUS | Status: DC | PRN
  Administered 2020-09-12: 500 mL

## 2020-09-12 MED ORDER — HYDROCODONE-ACETAMINOPHEN 5-325 MG PO TABS
1.0000 | ORAL_TABLET | ORAL | Status: DC | PRN
Start: 2020-09-12 — End: 2020-09-12

## 2020-09-12 MED ORDER — HEPARIN SODIUM (PORCINE) 1000 UNIT/ML IJ SOLN
INTRAMUSCULAR | Status: DC | PRN
  Administered 2020-09-12: 1000 [IU] via INTRAVENOUS
  Administered 2020-09-12: 15000 [IU] via INTRAVENOUS

## 2020-09-12 MED ORDER — HEPARIN SODIUM (PORCINE) 1000 UNIT/ML IJ SOLN
INTRAMUSCULAR | Status: AC
  Filled 2020-09-12: qty 2

## 2020-09-12 MED ORDER — PROMETHAZINE HCL 25 MG/ML IJ SOLN
6.2500 mg | INTRAMUSCULAR | Status: DC | PRN
  Administered 2020-09-12: 6.25 mg via INTRAVENOUS
  Filled 2020-09-12 (×3): qty 1

## 2020-09-12 MED ORDER — SODIUM CHLORIDE 0.9 % IV SOLN: 250.0000 mL | INTRAVENOUS | Status: DC | PRN

## 2020-09-12 MED ORDER — SODIUM CHLORIDE 0.9% FLUSH: 3.0000 mL | Freq: Two times a day (BID) | INTRAVENOUS | Status: DC

## 2020-09-12 MED ORDER — PROTAMINE SULFATE 10 MG/ML IV SOLN
INTRAVENOUS | Status: DC | PRN
  Administered 2020-09-12: 40 mg via INTRAVENOUS

## 2020-09-12 MED ORDER — FENTANYL CITRATE (PF) 100 MCG/2ML IJ SOLN
INTRAMUSCULAR | Status: AC
  Filled 2020-09-12: qty 2

## 2020-09-12 MED ORDER — FENTANYL CITRATE (PF) 100 MCG/2ML IJ SOLN
INTRAMUSCULAR | Status: DC | PRN
  Administered 2020-09-12: 100 ug via INTRAVENOUS

## 2020-09-12 SURGICAL SUPPLY — 18 items
BLANKET WARM UNDERBOD FULL ACC (MISCELLANEOUS) ×3 IMPLANT
CATH MAPPNG PENTARAY F 2-6-2MM (CATHETERS) ×1 IMPLANT
CATH SMTCH THERMOCOOL SF DF (CATHETERS) ×3 IMPLANT
CATH SOUNDSTAR ECO 8FR (CATHETERS) ×3 IMPLANT
CATH WEBSTER BI DIR CS D-F CRV (CATHETERS) ×3 IMPLANT
CLOSURE PERCLOSE PROSTYLE (VASCULAR PRODUCTS) ×9 IMPLANT
COVER SWIFTLINK CONNECTOR (BAG) ×3 IMPLANT
NEEDLE BAYLIS TRANSSEPTAL 71CM (NEEDLE) ×3 IMPLANT
PACK EP LATEX FREE (CUSTOM PROCEDURE TRAY) ×3
PACK EP LF (CUSTOM PROCEDURE TRAY) ×1 IMPLANT
PAD PRO RADIOLUCENT 2001M-C (PAD) ×3 IMPLANT
PATCH CARTO3 (PAD) ×3 IMPLANT
PENTARAY F 2-6-2MM (CATHETERS) ×3
SHEATH PINNACLE 7F 10CM (SHEATH) ×9 IMPLANT
SHEATH PINNACLE 9F 10CM (SHEATH) ×3 IMPLANT
SHEATH PROBE COVER 6X72 (BAG) ×3 IMPLANT
SHEATH SWARTZ TS SL2 63CM 8.5F (SHEATH) ×3 IMPLANT
TUBING SMART ABLATE COOLFLOW (TUBING) ×3 IMPLANT

## 2020-09-12 NOTE — Transfer of Care (Signed)
Immediate Anesthesia Transfer of Care Note  Patient: [name] [name]  Procedure(s) Performed: ATRIAL FIBRILLATION ABLATION  Patient Location: PACU and Cath Lab  Anesthesia Type:General  Level of Consciousness: awake, oriented and patient cooperative  Airway & Oxygen Therapy: Patient Spontanous Breathing and Patient connected to nasal cannula oxygen  Post-op Assessment: Report given to RN and Post -op Vital signs reviewed and stable  Post vital signs: Reviewed and stable  Last Vitals:  Vitals Value Taken Time  BP    Temp    Pulse 55 09/12/20 1008  Resp 10 09/12/20 1008  SpO2 96 % 09/12/20 1008  Vitals shown include unvalidated device data.  Last Pain:  Vitals:   09/12/20 0607  TempSrc:   PainSc: 2          Complications: No notable events documented.

## 2020-09-12 NOTE — H&P (Signed)
PCP: Monico Blitz, MD Primary Cardiologist: Dr Domenic Polite Primary EP: Dr Rayann Heman   [name] [name] is a 80 y.o. female who presents today for routine electrophysiology study and ablation for afib.  Since last being seen in our clinic, the patient reports doing reasonably well.  She is not very active.  She has SOB chronically. She also has fatigue chronically.  Today, she denies symptoms of palpitations, chest pain,  lower extremity edema, dizziness, presyncope, or syncope.  The patient is otherwise without complaint today.       Past Medical History:  Diagnosis Date   Allergic rhinitis     Anxiety     Asthma     Atrial flutter (HCC)     COPD (chronic obstructive pulmonary disease) ([place])     Depression     Essential hypertension     Hyperlipidemia     Lumbar disc disease     Obesity     OSA (obstructive sleep apnea)      CPAP   Paroxysmal atrial fibrillation (HCC)      Element of tachycardia bradycardia syndrome   Respiratory failure ([place])           Past Surgical History:  Procedure Laterality Date   ANTERIOR FUSION LUMBAR SPINE       BIOPSY   08/27/2016    Procedure: BIOPSY;  Surgeon: Rogene Houston, MD;  [facility];  Service: Endoscopy;;  FOUR GASTRIC POLYPS BIOPSIED   CARDIOVERSION N/A 03/28/2014    Procedure: CARDIOVERSION;  Surgeon: Fay Records, MD;  Location: [facility];  Service: Cardiovascular;  Laterality: N/A;   CARDIOVERSION N/A 10/22/2016    Procedure: CARDIOVERSION;  Surgeon: Sueanne Margarita, MD;  Location: [place];  Service: Cardiovascular;  Laterality: N/A;   CATARACT EXTRACTION       COLONOSCOPY N/A 08/04/2012    Procedure: COLONOSCOPY;  Surgeon: Rogene Houston, MD;  [facility];  Service: Endoscopy;  Laterality: N/A;  730-rescheduled to [place] notified pt   ELECTROPHYSIOLOGIC STUDY N/A 09/18/2014    Procedure: Atrial Fibrillation Ablation;  Surgeon: Thompson Grayer, MD;  Location: [place] [facility];  Service: Cardiovascular;   Laterality: N/A;   ESOPHAGOGASTRODUODENOSCOPY N/A 08/27/2016    Procedure: ESOPHAGOGASTRODUODENOSCOPY (EGD);  Surgeon: Rogene Houston, MD;  [facility];  Service: Endoscopy;  Laterality: N/A;  1200   LASIK        Both eyes   TEE WITHOUT CARDIOVERSION N/A 09/18/2014    Procedure: TRANSESOPHAGEAL ECHOCARDIOGRAM (TEE);  Surgeon: Larey Dresser, MD;  Location: [place];  Service: Cardiovascular;  Laterality: N/A;   TEE WITHOUT CARDIOVERSION N/A 10/22/2016    Procedure: TRANSESOPHAGEAL ECHOCARDIOGRAM (TEE);  Surgeon: Sueanne Margarita, MD;  Location: [facility] ENDOSCOPY;  Service: Cardiovascular;  Laterality: N/A;   TEE WITHOUT CARDIOVERSION N/A 08/24/2017    Procedure: TRANSESOPHAGEAL ECHOCARDIOGRAM (TEE);  Surgeon: Josue Hector, MD;  Location: [facility] ENDOSCOPY;  Service: Cardiovascular;  Laterality: N/A;   TOTAL HIP ARTHROPLASTY   2010    Right      ROS- all systems are reviewed and negatives except as per HPI above         Current Outpatient Medications  Medication Sig Dispense Refill   acetaminophen (TYLENOL) 500 MG tablet Take 500 mg by mouth daily as needed for headache.       antiseptic oral rinse (BIOTENE) LIQD 15 mLs by Mouth Rinse route as needed for dry mouth.       atorvastatin (LIPITOR)  10 MG tablet Take 10 mg by mouth every evening.       benzonatate (TESSALON) 200 MG capsule Take 1 capsule (200 mg total) by mouth 3 (three) times daily as needed for cough. 30 capsule 3   Biotin 1 MG CAPS Take 1,000 mg by mouth 3 (three) times a week.       budesonide (PULMICORT) 0.5 MG/2ML nebulizer solution Take 0.5 mg by nebulization daily.       BUDESONIDE PO USE ONE vial via NEBULIZER TWICE DAILY       Calcium Carbonate-Vit D-Min (QC CALCIUM-MAGNESIUM-ZINC-D3) 333.4-133 MG-UNIT TABS Take 1 tablet by mouth 2 (two) times daily.       cetirizine (ZYRTEC) 10 MG tablet Take 10 mg by mouth daily as needed for allergies.       clindamycin (CLEOCIN) 300 MG capsule 2 capsules as needed. Take  prior to dental procedures   0   DERMA-SMOOTHE/FS SCALP 0.01 % OIL APPLY A SMALL AMOUNT TO SKIN THREE TIMES A WEEK, PART HAIR APPLY TO SCALP, COVER WITH PLASTIC CAP, WASH OUT IN THE MONING       diltiazem (CARDIZEM CD) 120 MG 24 hr capsule TAKE ONE CAPSULE BY MOUTH AT BEDTIME 90 capsule 2   diltiazem (CARDIZEM) 30 MG tablet TAKE 1 TABLET BY MOUTH AS NEEDED FOR PALPITATIONS 45 tablet 2   dofetilide (TIKOSYN) 500 MCG capsule TAKE 1 CAPSULE BY MOUTH TWICE DAILY 180 capsule 1   EPINEPHrine 0.3 mg/0.3 mL IJ SOAJ injection Inject into the muscle.       famotidine (PEPCID) 20 MG tablet Take 2 tablets (40 mg total) by mouth at bedtime as needed. 30 tablet 1   fluticasone (FLONASE) 50 MCG/ACT nasal spray Place 1 spray into both nostrils 2 (two) times daily.       hydrALAZINE (APRESOLINE) 100 MG tablet Take 100 mg by mouth 2 (two) times daily.       ipratropium-albuterol (DUONEB) 0.5-2.5 (3) MG/3ML SOLN Take 3 mLs by nebulization daily.       ketoconazole (NIZORAL) 2 % shampoo Apply topically.       losartan (COZAAR) 100 MG tablet Take 1 tablet (100 mg total) by mouth daily. 90 tablet 2   Magnesium 400 MG TABS Take 400 mg by mouth daily.       montelukast (SINGULAIR) 10 MG tablet Take 10 mg by mouth at bedtime.       pantoprazole (PROTONIX) 40 MG tablet Take 1 tablet (40 mg total) by mouth in the morning. Take 40 mg by mouth in the morning. 90 tablet 3   potassium chloride SA (KLOR-CON) 20 MEQ tablet Take 1 tablet (20 mEq total) by mouth daily. 90 tablet 2   Sodium Chloride-Sodium Bicarb (AYR SALINE NASAL NETI RINSE) 1.57 g PACK Place 1 each into the nose daily as needed (rinse nasal often).       triamcinolone ointment (KENALOG) 0.1 % Apply 1 application topically daily as needed for rash.   2   umeclidinium-vilanterol (ANORO ELLIPTA) 62.5-25 MCG/INH AEPB INHALE ONE PUFF INTO THE LUNGS DAILY 60 each 11   warfarin (COUMADIN) 5 MG tablet Take 5 mg by mouth as directed.       Wheat Dextrin (BENEFIBER ON THE  GO) POWD Take 1 Dose by mouth daily as needed (constipation). 2 tsp daily in the morning.                 Current Facility-Administered Medications  Medication Dose Route Frequency Provider Last Rate Last  Admin   Mepolizumab SOLR 100 mg  100 mg Subcutaneous Q28 days Baird Lyons D, MD   100 mg at 09/07/19 0947   Mepolizumab SOLR 100 mg  100 mg Subcutaneous Q28 days Baird Lyons D, MD   100 mg at 11/02/19 1123      Physical Exam: Vitals:   09/12/20 0538  BP: (!) 163/64  Pulse: (!) 44  Temp: 97.7 F (36.5 C)  SpO2: 100%      GEN- The patient is overweight appearing, alert and oriented x 3 today.   Head- normocephalic, atraumatic Eyes-  Sclera clear, conjunctiva pink Ears- hearing intact Oropharynx- clear Lungs- Clear to ausculation bilaterally, normal work of breathing Heart- Regular rate and rhythm, no murmurs, rubs or gallops, PMI not laterally displaced GI- soft, NT, ND, + BS Extremities- no clubbing, cyanosis, or edema   Echo 3/55/73- EF 22%, diastolic dysfunction, severe LA enlargement,       Assessment and Plan:   1.  Persistent afib/ atrial flutter She is doing well with tikosyn S/p afib ablation with PVI and CTI performed 09/18/2014. We will need to follow her closely on this medicine to avoid toxicity She has severe LA enlargement   Risk, benefits, and alternatives to EP study and radiofrequency ablation for afib were again discussed in detail today. These risks include but are not limited to stroke, bleeding, vascular damage, tamponade, perforation, damage to the esophagus, lungs, and other structures, pulmonary vein stenosis, worsening renal function, and death. The patient understands these risk and wishes to proceed.    Cardiac CT reviewed at length with the patient today.  she reports compliance with [place] without interruption.  Thompson Grayer MD, [place] 09/12/2020 7:21 AM

## 2020-09-12 NOTE — Discharge Instructions (Addendum)
Post procedure care instructions No driving for 4 days. No lifting over 5 lbs for 1 week. No vigorous or sexual activity for 1 week. You may return to work/your usual activities on 09/20/20. Keep procedure site clean & dry. If you notice increased pain, swelling, bleeding or pus, call/return!  You may shower after 24 hours, but no soaking in baths/hot tubs/pools for 1 week.    You have an appointment set up with the [facility].  Multiple studies have shown that being followed by a dedicated atrial fibrillation clinic in addition to the standard care you receive from your other physicians improves health. We believe that enrollment in the atrial fibrillation clinic will allow [place] to better care for you.   The phone number to the [facility] is [phone]. The clinic is staffed Monday through Friday from 8:30am to 5pm.  Parking Directions: The clinic is located in the Heart and Vascular Building connected to [facility]. 1)From [address] turn on to [address] and go to the 3rd entrance  (Heart and Vascular entrance) on the right. 2)Look to the right for Heart &Vascular Parking Garage. 3)A code for the entrance is required, for July is 3342 4)Take the elevators to the 1st floor. Registration is in the room with the glass walls at the end of the hallway.  If you have any trouble parking or locating the clinic, please don't hesitate to call [phone].   Cardiac Ablation, Care After  This sheet gives you information about how to care for yourself after your procedure. Your health care provider may also give you more specific instructions. If you have problems or questions, contact your health care provider. What can I expect after the procedure? After the procedure, it is common to have: Bruising around your puncture site. Tenderness around your puncture site. Skipped heartbeats. Tiredness (fatigue).  Follow these instructions at home: Puncture  site care  Follow instructions from your health care provider about how to take care of your puncture site. Make sure you: If present, leave stitches (sutures), skin glue, or adhesive strips in place. These skin closures may need to stay in place for up to 2 weeks. If adhesive strip edges start to loosen and curl up, you may trim the loose edges. Do not remove adhesive strips completely unless your health care provider tells you to do that. If a square bandage is present, this may be removed in 24 hours.  Check your puncture site every day for signs of infection. Check for: Redness, swelling, or pain. Fluid or blood. If your puncture site starts to bleed, lie down on your back, apply firm pressure to the area, and contact your health care provider. Warmth. Pus or a bad smell. Driving Do not drive for at least 4 days after your procedure or however long your health care provider recommends. (Do not resume driving if you have previously been instructed not to drive for other health reasons.) Do not drive or use heavy machinery while taking prescription pain medicine. Activity Avoid activities that take a lot of effort for at least 7 days after your procedure. Do not lift anything that is heavier than 5 lb (4.5 kg) for one week.  No sexual activity for 1 week.  Return to your normal activities as told by your health care provider. Ask your health care provider what activities are safe for you. General instructions Take over-the-counter and prescription medicines only as told by your health care provider. Do not use  any products that contain nicotine or tobacco, such as cigarettes and e-cigarettes. If you need help quitting, ask your health care provider. You may shower after 24 hours, but Do not take baths, swim, or use a hot tub for 1 week.  Do not drink alcohol for 24 hours after your procedure. Keep all follow-up visits as told by your health care provider. This is important. Contact a health  care provider if: You have redness, mild swelling, or pain around your puncture site. You have fluid or blood coming from your puncture site that stops after applying firm pressure to the area. Your puncture site feels warm to the touch. You have pus or a bad smell coming from your puncture site. You have a fever. You have chest pain or discomfort that spreads to your neck, jaw, or arm. You are sweating a lot. You feel nauseous. You have a fast or irregular heartbeat. You have shortness of breath. You are dizzy or light-headed and feel the need to lie down. You have pain or numbness in the arm or leg closest to your puncture site. Get help right away if: Your puncture site suddenly swells. Your puncture site is bleeding and the bleeding does not stop after applying firm pressure to the area. These symptoms may represent a serious problem that is an emergency. Do not wait to see if the symptoms will go away. Get medical help right away. Call your local emergency services (911 in the [place] Do not drive yourself to the hospital. Summary After the procedure, it is normal to have bruising and tenderness at the puncture site in your groin, neck, or forearm. Check your puncture site every day for signs of infection. Get help right away if your puncture site is bleeding and the bleeding does not stop after applying firm pressure to the area. This is a medical emergency. This information is not intended to replace advice given to you by your health care provider. Make sure you discuss any questions you have with your health care provider.

## 2020-09-12 NOTE — Progress Notes (Signed)
Pt states  concerns because she has a lot of nasal drainage that goes down the back of her throat. When I gave her tylenol dosage this morning she choked, she later stated it was not the pill, water went down the wrong way. Pt had to sit up at edge of bed to continue to cough to clear throat x 5 minutes. I called anesthesia charge/ rebecca to update, she will inform team for her case.

## 2020-09-12 NOTE — Anesthesia Postprocedure Evaluation (Signed)
Anesthesia Post Note  Patient: [name] [name]  Procedure(s) Performed: ATRIAL FIBRILLATION ABLATION     Patient location during evaluation: Phase II Anesthesia Type: General Level of consciousness: patient cooperative, awake and alert and oriented Pain management: pain level controlled Vital Signs Assessment: post-procedure vital signs reviewed and stable Respiratory status: nonlabored ventilation, respiratory function stable and spontaneous breathing Cardiovascular status: blood pressure returned to baseline and stable Postop Assessment: no apparent nausea or vomiting and adequate PO intake Anesthetic complications: no   No notable events documented.  Last Vitals:  Vitals:   09/12/20 1215 09/12/20 1230  BP: (!) 151/54 (!) 170/55  Pulse: 64 (!) 55  Resp: (!) 21 15  Temp:    SpO2: 100% 99%    Last Pain:  Vitals:   09/12/20 1120  TempSrc:   PainSc: 0-No pain                 Keyle Doby,E. Naksh Radi

## 2020-09-12 NOTE — Anesthesia Procedure Notes (Signed)
Procedure Name: Intubation Date/Time: 09/12/2020 7:47 AM Performed by: Lowella Dell, CRNA Pre-anesthesia Checklist: Patient identified, Emergency Drugs available, Suction available and Patient being monitored Patient Re-evaluated:Patient Re-evaluated prior to induction Oxygen Delivery Method: Circle System Utilized Preoxygenation: Pre-oxygenation with 100% oxygen Induction Type: IV induction, Rapid sequence and Cricoid Pressure applied Laryngoscope Size: Mac and 4 Grade View: Grade I Tube type: Oral Tube size: 7.0 mm Number of attempts: 1 Airway Equipment and Method: Stylet Placement Confirmation: ETT inserted through vocal cords under direct vision, positive ETCO2 and breath sounds checked- equal and bilateral Secured at: 20 cm Tube secured with: Tape Dental Injury: Teeth and Oropharynx as per pre-operative assessment  Comments: RSI d/t pt GERD symptoms this morning

## 2020-09-13 ENCOUNTER — Encounter (HOSPITAL_COMMUNITY): Payer: Self-pay | Admitting: Internal Medicine

## 2020-09-14 ENCOUNTER — Telehealth: Payer: Self-pay | Admitting: Student in an Organized Health Care Education/Training Program

## 2020-09-14 NOTE — Telephone Encounter (Signed)
Paged by operator regarding BP/HR variable.   HR 39 this morning, went into the 50s mid morning.  HR went AF to 117. HR currently 44.  She has rhythm awareness and felt that she was in AF this morning. A few times has gone out of AF. Completely asx other than fatigue. Took dilt 30 mg PO prn when HR went up. Went down to 47s. Denies presyncope or syncope. Had some wheezing overnight, sats ok on pulse ox. Denies any chest discomfort. Still on tikosyn and takes dilt 120 mg PO at night. On xarelto for stroke risk reduction.

## 2020-09-14 NOTE — Telephone Encounter (Signed)
Asked her to hold off on taking additional tikosyn tomorrow with sinus brady (reported) of HR 39. Already has taken both dilt and tikosyn tonight. Will discuss with EP team in morning but likely d/c tikosyn if she is baseline in the 50-60 HR and often going into the 40s.

## 2020-09-15 NOTE — Telephone Encounter (Signed)
Got a handoff from our overnight fellow regarding bradycardia.  Since speaking with the patient, she is completely asymptomatic.  She has a history of bradycardia based on Dr. Jackalyn Lombard note in May.  She just underwent her second atrial fibrillation ablation 3 days ago.  Yesterday she noted she has went back into atrial fibrillation with heart rate up to 110s.  However in the morning her heart rate is as low as high 30s.  Patient was initially instructed to hold her Tikosyn, however given her recurrence of atrial fibrillation, we do not feel comfortable holding her Tikosyn at this time.  As spoke with Dr. Caryl Comes our electrophysiologist, our current recommendation is for her to resume diltiazem and Tikosyn.   She is scheduled to see atrial fibrillation clinic about a month from now, we will try to move up her appointment time to be seen earlier and have her heart rate reassessed in the clinic.  Please arrange earlier appointment with A. fib clinic.

## 2020-09-16 ENCOUNTER — Other Ambulatory Visit (INDEPENDENT_AMBULATORY_CARE_PROVIDER_SITE_OTHER): Payer: Self-pay

## 2020-09-16 DIAGNOSIS — K219 Gastro-esophageal reflux disease without esophagitis: Secondary | ICD-10-CM

## 2020-09-16 MED ORDER — FAMOTIDINE 20 MG PO TABS
40.0000 mg | ORAL_TABLET | Freq: Every evening | ORAL | 1 refills | Status: AC | PRN
Start: 2020-09-16 — End: ?

## 2020-09-18 ENCOUNTER — Ambulatory Visit (INDEPENDENT_AMBULATORY_CARE_PROVIDER_SITE_OTHER): Payer: Medicare Other | Admitting: *Deleted

## 2020-09-18 ENCOUNTER — Other Ambulatory Visit: Payer: Self-pay

## 2020-09-18 VITALS — BP 145/73 | HR 57 | Temp 98.1°F | Resp 18

## 2020-09-18 DIAGNOSIS — J4520 Mild intermittent asthma, uncomplicated: Secondary | ICD-10-CM | POA: Diagnosis not present

## 2020-09-18 MED ORDER — MEPOLIZUMAB 100 MG/ML ~~LOC~~ SOSY
100.0000 mg | PREFILLED_SYRINGE | Freq: Once | SUBCUTANEOUS | Status: AC
  Administered 2020-09-18: 100 mg via SUBCUTANEOUS
  Filled 2020-09-18: qty 1

## 2020-09-18 MED ORDER — METHYLPREDNISOLONE SODIUM SUCC 125 MG IJ SOLR: 125.0000 mg | Freq: Once | INTRAMUSCULAR | Status: DC | PRN

## 2020-09-18 MED ORDER — ALBUTEROL SULFATE HFA 108 (90 BASE) MCG/ACT IN AERS: 2.0000 | INHALATION_SPRAY | Freq: Once | RESPIRATORY_TRACT | Status: DC | PRN

## 2020-09-18 MED ORDER — DIPHENHYDRAMINE HCL 50 MG/ML IJ SOLN: 50.0000 mg | Freq: Once | INTRAMUSCULAR | Status: DC | PRN

## 2020-09-18 MED ORDER — FAMOTIDINE IN NACL 20-0.9 MG/50ML-% IV SOLN: 20.0000 mg | Freq: Once | INTRAVENOUS | Status: DC | PRN

## 2020-09-18 MED ORDER — EPINEPHRINE 0.3 MG/0.3ML IJ SOAJ: 0.3000 mg | Freq: Once | INTRAMUSCULAR | Status: DC | PRN

## 2020-09-18 MED ORDER — SODIUM CHLORIDE 0.9 % IV SOLN: Freq: Once | INTRAVENOUS | Status: DC | PRN

## 2020-09-18 NOTE — Progress Notes (Signed)
Diagnosis: Asthma  Provider:  Praveen Mannam, MD  Procedure: Injection  Nucala (Mepolizumab), Dose: 100 mg, Site: subcutaneous  Discharge: Condition: Good, Destination: Home . AVS provided to patient.   Performed by:  Brynnlie Unterreiner A, RN        

## 2020-09-26 ENCOUNTER — Other Ambulatory Visit: Payer: Self-pay | Admitting: *Deleted

## 2020-09-26 DIAGNOSIS — I10 Essential (primary) hypertension: Secondary | ICD-10-CM | POA: Diagnosis not present

## 2020-09-26 MED ORDER — POTASSIUM CHLORIDE CRYS ER 20 MEQ PO TBCR: 20.0000 meq | EXTENDED_RELEASE_TABLET | Freq: Every day | ORAL | 3 refills | Status: DC

## 2020-10-02 ENCOUNTER — Other Ambulatory Visit: Payer: Self-pay

## 2020-10-02 ENCOUNTER — Ambulatory Visit (HOSPITAL_COMMUNITY)
Admission: RE | Admit: 2020-10-02 | Discharge: 2020-10-02 | Disposition: A | Discharge status: Home / Self Care | Payer: Medicare Other | Source: Ambulatory Visit | Attending: Nurse Practitioner | Admitting: Nurse Practitioner

## 2020-10-02 ENCOUNTER — Encounter (HOSPITAL_COMMUNITY): Payer: Self-pay | Admitting: Nurse Practitioner

## 2020-10-02 VITALS — BP 128/70 | HR 83 | Ht 62.5 in | Wt 191.0 lb

## 2020-10-02 DIAGNOSIS — E785 Hyperlipidemia, unspecified: Secondary | ICD-10-CM | POA: Diagnosis not present

## 2020-10-02 DIAGNOSIS — Z7901 Long term (current) use of anticoagulants: Secondary | ICD-10-CM | POA: Diagnosis not present

## 2020-10-02 DIAGNOSIS — I10 Essential (primary) hypertension: Secondary | ICD-10-CM | POA: Insufficient documentation

## 2020-10-02 DIAGNOSIS — J449 Chronic obstructive pulmonary disease, unspecified: Secondary | ICD-10-CM | POA: Insufficient documentation

## 2020-10-02 DIAGNOSIS — D6869 Other thrombophilia: Secondary | ICD-10-CM

## 2020-10-02 DIAGNOSIS — I517 Cardiomegaly: Secondary | ICD-10-CM | POA: Insufficient documentation

## 2020-10-02 DIAGNOSIS — Z79899 Other long term (current) drug therapy: Secondary | ICD-10-CM | POA: Diagnosis not present

## 2020-10-02 DIAGNOSIS — I483 Typical atrial flutter: Secondary | ICD-10-CM

## 2020-10-02 DIAGNOSIS — I4891 Unspecified atrial fibrillation: Secondary | ICD-10-CM | POA: Diagnosis not present

## 2020-10-02 DIAGNOSIS — Z7951 Long term (current) use of inhaled steroids: Secondary | ICD-10-CM | POA: Diagnosis not present

## 2020-10-02 DIAGNOSIS — I4892 Unspecified atrial flutter: Secondary | ICD-10-CM | POA: Diagnosis not present

## 2020-10-02 LAB — BASIC METABOLIC PANEL
Anion gap: 5 (ref 5–15)
BUN: 15 mg/dL (ref 8–23)
CO2: 24 mmol/L (ref 22–32)
Calcium: 9 mg/dL (ref 8.9–10.3)
Chloride: 108 mmol/L (ref 98–111)
Creatinine, Ser: 1.24 mg/dL — ABNORMAL HIGH (ref 0.44–1.00)
GFR, Estimated: 44 mL/min — ABNORMAL LOW (ref >60)
Glucose, Bld: 105 mg/dL — ABNORMAL HIGH (ref 70–99)
Potassium: 4 mmol/L (ref 3.5–5.1)
Sodium: 137 mmol/L (ref 135–145)

## 2020-10-02 LAB — MAGNESIUM: Magnesium: 2.1 mg/dL (ref 1.7–2.4)

## 2020-10-02 NOTE — Progress Notes (Signed)
Primary Care Physician: Monico Blitz, MD Referring Physician: Dr. [name] is a 80 y.o. female with a h/o afib that had an afib ablation 6/16. No swallowing or groin issues. She has had more issues with her HR being elevated more than usual but it has been regular.  EKG today shows atrial  flutter rate controlled. Continues on eliquis with a CHA2DS2VASc  score of at least 5.   Today, she denies symptoms of palpitations, chest pain, shortness of breath, orthopnea, PND, lower extremity edema, dizziness, presyncope, syncope, or neurologic sequela. The patient is tolerating medications without difficulties and is otherwise without complaint today.   Past Medical History:  Diagnosis Date   Allergic rhinitis    Anxiety    Asthma    Atrial flutter (HCC)    COPD (chronic obstructive pulmonary disease) ([place])    Depression    Essential hypertension    Hyperlipidemia    Lumbar disc disease    Obesity    OSA (obstructive sleep apnea)    CPAP   Paroxysmal atrial fibrillation (HCC)    Element of tachycardia bradycardia syndrome   Respiratory failure ([place])    Past Surgical History:  Procedure Laterality Date   ANTERIOR FUSION LUMBAR SPINE     ATRIAL FIBRILLATION ABLATION N/A 09/12/2020   Procedure: ATRIAL FIBRILLATION ABLATION;  Surgeon: Thompson Grayer, MD;  Location: [place] [facility];  Service: Cardiovascular;  Laterality: N/A;   BIOPSY  08/27/2016   Procedure: BIOPSY;  Surgeon: Rogene Houston, MD;  [facility];  Service: Endoscopy;;  FOUR GASTRIC POLYPS BIOPSIED   CARDIOVERSION N/A 03/28/2014   Procedure: CARDIOVERSION;  Surgeon: Fay Records, MD;  Location: [facility];  Service: Cardiovascular;  Laterality: N/A;   CARDIOVERSION N/A 10/22/2016   Procedure: CARDIOVERSION;  Surgeon: Sueanne Margarita, MD;  Location: [facility];  Service: Cardiovascular;  Laterality: N/A;   CATARACT EXTRACTION     COLONOSCOPY N/A 08/04/2012   Procedure: COLONOSCOPY;  Surgeon:  Rogene Houston, MD;  [facility];  Service: Endoscopy;  Laterality: N/A;  730-rescheduled to [place] notified pt   ELECTROPHYSIOLOGIC STUDY N/A 09/18/2014   Procedure: Atrial Fibrillation Ablation;  Surgeon: Thompson Grayer, MD;  Location: [place] [facility];  Service: Cardiovascular;  Laterality: N/A;   ESOPHAGOGASTRODUODENOSCOPY N/A 08/27/2016   Procedure: ESOPHAGOGASTRODUODENOSCOPY (EGD);  Surgeon: Rogene Houston, MD;  [facility];  Service: Endoscopy;  Laterality: N/A;  1200   LASIK     Both eyes   TEE WITHOUT CARDIOVERSION N/A 09/18/2014   Procedure: TRANSESOPHAGEAL ECHOCARDIOGRAM (TEE);  Surgeon: Larey Dresser, MD;  Location: [place];  Service: Cardiovascular;  Laterality: N/A;   TEE WITHOUT CARDIOVERSION N/A 10/22/2016   Procedure: TRANSESOPHAGEAL ECHOCARDIOGRAM (TEE);  Surgeon: Sueanne Margarita, MD;  Location: [facility] ENDOSCOPY;  Service: Cardiovascular;  Laterality: N/A;   TEE WITHOUT CARDIOVERSION N/A 08/24/2017   Procedure: TRANSESOPHAGEAL ECHOCARDIOGRAM (TEE);  Surgeon: Josue Hector, MD;  Location: [facility] ENDOSCOPY;  Service: Cardiovascular;  Laterality: N/A;   TOTAL HIP ARTHROPLASTY  2010   Right    Current Outpatient Medications  Medication Sig Dispense Refill   acetaminophen (TYLENOL) 500 MG tablet Take 500 mg by mouth daily as needed for headache.     antiseptic oral rinse (BIOTENE) LIQD 15 mLs by Mouth Rinse route daily as needed for dry mouth.     atorvastatin (LIPITOR) 10 MG tablet Take 10 mg by mouth daily at 6 PM.  benzonatate (TESSALON) 200 MG capsule Take 1 capsule (200 mg total) by mouth 3 (three) times daily as needed for cough. 30 capsule 3   Biotin 1 MG CAPS Take 1,000 mg by mouth 3 (three) times a week.      budesonide (PULMICORT) 0.5 MG/2ML nebulizer solution Take 0.5 mg by nebulization daily.      Calcium Carbonate-Vit D-Min (QC CALCIUM-MAGNESIUM-ZINC-D3) 333.4-133 MG-UNIT TABS Take 1 tablet by mouth 2 (two) times daily.      cetirizine  (ZYRTEC) 10 MG tablet Take 10 mg by mouth at bedtime as needed for allergies.     clindamycin (CLEOCIN) 300 MG capsule Take 2 capsules by mouth as needed. Take prior to dental procedures  0   Dextromethorphan-guaiFENesin (MUCINEX DM PO) Take 1 tablet by mouth daily as needed (respiratory).     diltiazem (CARDIZEM CD) 120 MG 24 hr capsule TAKE ONE CAPSULE BY MOUTH AT BEDTIME (Patient taking differently: Take 120 mg by mouth daily at 6 PM.) 90 capsule 2   diltiazem (CARDIZEM) 30 MG tablet TAKE 1 TABLET BY MOUTH AS NEEDED FOR PALPITATIONS (Patient taking differently: Take 30 mg by mouth daily as needed (NEEDED FOR PALPITATIONS). T) 45 tablet 2   dofetilide (TIKOSYN) 500 MCG capsule Take 1 capsule (500 mcg total) by mouth 2 (two) times daily. 180 capsule 1   EPINEPHrine 0.3 mg/0.3 mL IJ SOAJ injection Inject 0.3 mg into the muscle as needed for anaphylaxis.     famotidine (PEPCID) 20 MG tablet Take 2 tablets (40 mg total) by mouth at bedtime as needed. 30 tablet 1   fluticasone (FLONASE) 50 MCG/ACT nasal spray Place 1 spray into both nostrils 2 (two) times daily.     hydrALAZINE (APRESOLINE) 100 MG tablet Take 100 mg by mouth 2 (two) times daily.     HYDROCODONE-ACETAMINOPHEN PO Take 5 mLs by mouth every 6 (six) hours as needed for moderate pain. Hydromet  5 mg/1.5 mg     ipratropium-albuterol (DUONEB) 0.5-2.5 (3) MG/3ML SOLN Take 3 mLs by nebulization 4 (four) times daily as needed (Shortness of breathing).     ketoconazole (NIZORAL) 2 % shampoo Apply 1 application topically 3 (three) times a week.     losartan (COZAAR) 100 MG tablet Take 1 tablet (100 mg total) by mouth daily. (Patient taking differently: Take 50 mg by mouth daily.) 90 tablet 2   Magnesium 250 MG TABS Take 250 mg by mouth daily.     montelukast (SINGULAIR) 10 MG tablet Take 10 mg by mouth every Friday at 6 PM.     pantoprazole (PROTONIX) 40 MG tablet Take 1 tablet (40 mg total) by mouth in the morning. Take 40 mg by mouth in the  morning. 90 tablet 3   potassium chloride SA (KLOR-CON) 20 MEQ tablet Take 1 tablet (20 mEq total) by mouth daily. 90 tablet 3   Rivaroxaban (XARELTO) 15 MG TABS tablet Take 1 tablet (15 mg total) by mouth daily with supper. 90 tablet 3   Sodium Chloride-Sodium Bicarb (AYR SALINE NASAL NETI RINSE) 1.57 g PACK Place 1 each into the nose daily as needed (rinse nasal often). Neil sinus rinse     umeclidinium-vilanterol (ANORO ELLIPTA) 62.5-25 MCG/INH AEPB INHALE ONE PUFF INTO THE LUNGS DAILY (Patient taking differently: Inhale 1 puff into the lungs daily.) 60 each 11   Wheat Dextrin (BENEFIBER ON THE GO) POWD Take 10 mLs by mouth daily.     Budeson-Glycopyrrol-Formoterol (BREZTRI AEROSPHERE) 160-9-4.8 MCG/ACT AERO Inhale 2 puffs into the lungs  2 (two) times daily. (Patient not taking: No sig reported) 10.7 g 0   DERMA-SMOOTHE/FS SCALP 0.01 % OIL Apply 1 application topically 3 (three) times a week. (Patient not taking: Reported on 10/02/2020)     Current Facility-Administered Medications  Medication Dose Route Frequency Provider Last Rate Last Admin   Mepolizumab SOLR 100 mg  100 mg Subcutaneous Q28 days Baird Lyons D, MD   100 mg at 09/07/19 0947   Mepolizumab SOLR 100 mg  100 mg Subcutaneous Q28 days Baird Lyons D, MD   100 mg at 11/02/19 1123    Allergies  Allergen Reactions   Bupropion Hcl Other (See Comments)    Suicidal Thoughts.    Escitalopram Oxalate Other (See Comments)    Suicidal Thoughts.    Fluticasone-Salmeterol Other (See Comments)    Advair - Caused patient to go into Afib.    Lisinopril Cough   Serevent Other (See Comments)    Caused patient to go into Afib    Social History   Socioeconomic History   Marital status: Single    Spouse name: Not on file   Number of children: Not on file   Years of education: Not on file   Highest education level: Not on file  Occupational History   Occupation: Retired: Presenter, broadcasting  Tobacco Use   Smoking status: Never    Smokeless tobacco: Never  Vaping Use   Vaping Use: Never used  Substance and Sexual Activity   Alcohol use: No    Alcohol/week: 0.0 standard drinks   Drug use: No   Sexual activity: Not on file  Other Topics Concern   Not on file  Social History Narrative   Pt lives in [place] alone. She was never married.   Retired Customer service manager.   Attends [employer]   Social Determinants of Health   Financial Resource Strain: Not on file  Food Insecurity: Not on file  Transportation Needs: Not on file  Physical Activity: Not on file  Stress: Not on file  Social Connections: Not on file  Intimate Partner Violence: Not on file    Family History  Problem Relation Age of Onset   Cancer Mother    Heart attack Father    Colon cancer Other     ROS- All systems are reviewed and negative except as per the HPI above  Physical Exam: Vitals:   10/02/20 1030  BP: 128/70  Pulse: 83  Weight: 86.6 kg  Height: 5' 2.5" (1.588 m)   Wt Readings from Last 3 Encounters:  10/02/20 86.6 kg  09/12/20 86.2 kg  09/04/20 88.3 kg    Labs: Lab Results  Component Value Date   NA 141 08/21/2020   K 4.2 08/21/2020   CL 105 08/21/2020   CO2 21 08/21/2020   GLUCOSE 67 08/21/2020   BUN 18 08/21/2020   CREATININE 1.02 (H) 08/21/2020   CALCIUM 8.6 (L) 08/21/2020   MG 2.3 05/09/2020   Lab Results  Component Value Date   INR 2.0 (H) 09/19/2019   No results found for: CHOL, HDL, LDLCALC, TRIG   GEN- The patient is well appearing, alert and oriented x 3 today.   Head- normocephalic, atraumatic Eyes-  Sclera clear, conjunctiva pink Ears- hearing intact Oropharynx- clear Neck- supple, no JVP Lymph- no cervical lymphadenopathy Lungs- Clear to ausculation bilaterally, normal work of breathing Heart- Regular rate and rhythm, no murmurs, rubs or gallops, PMI not laterally displaced GI- soft, NT, ND, + BS Extremities- no clubbing,  cyanosis, or edema MS- no significant deformity or  atrophy Skin- no rash or lesion Psych- euthymic mood, full affect Neuro- strength and sensation are intact  EKG-atrial flutter 2:1  at 83 bpm, pr int 208 ms, qrs int 104 ms, qtc 505 ms ( flutter contributing to qt )     Assessment and Plan:  1. Atrail fib S/p ablation one month ago She is now in atrial flutter  Not sure if paroxsymal at this point  vrs persistent She will have an ekg in [place] office next week and forward to me  If still out of rhythm will arrange for CV at [facility]   2, HTN  Stable   3. CHA2DS2VASc  score of at least 5  Continue eliquis   Will reassess after ekg next week    [name] [name], [facility] [address] [place], [place] [address] [phone]

## 2020-10-09 ENCOUNTER — Ambulatory Visit (INDEPENDENT_AMBULATORY_CARE_PROVIDER_SITE_OTHER): Payer: Medicare Other | Admitting: *Deleted

## 2020-10-09 DIAGNOSIS — I4892 Unspecified atrial flutter: Secondary | ICD-10-CM | POA: Diagnosis not present

## 2020-10-09 NOTE — Progress Notes (Signed)
Presents to office for EKG per last OV in A-Fib Clinic with Roderic Palau, NP:  1. Atrail fib S/p ablation one month ago She is now in atrial flutter Not sure if paroxsymal at this point  vrs persistent She will have an ekg in [place] office next week and forward to me If still out of rhythm will arrange for CV at [facility]  EKG done and results sent to Roderic Palau, NP

## 2020-10-09 NOTE — Patient Instructions (Signed)
We will contact you with your result

## 2020-10-10 ENCOUNTER — Ambulatory Visit (HOSPITAL_COMMUNITY): Payer: Medicare Other | Admitting: Nurse Practitioner

## 2020-10-11 DIAGNOSIS — M9902 Segmental and somatic dysfunction of thoracic region: Secondary | ICD-10-CM | POA: Diagnosis not present

## 2020-10-11 DIAGNOSIS — M48061 Spinal stenosis, lumbar region without neurogenic claudication: Secondary | ICD-10-CM | POA: Diagnosis not present

## 2020-10-11 DIAGNOSIS — M9901 Segmental and somatic dysfunction of cervical region: Secondary | ICD-10-CM | POA: Diagnosis not present

## 2020-10-11 DIAGNOSIS — M9903 Segmental and somatic dysfunction of lumbar region: Secondary | ICD-10-CM | POA: Diagnosis not present

## 2020-10-11 DIAGNOSIS — M542 Cervicalgia: Secondary | ICD-10-CM | POA: Diagnosis not present

## 2020-10-11 DIAGNOSIS — M546 Pain in thoracic spine: Secondary | ICD-10-CM | POA: Diagnosis not present

## 2020-10-11 DIAGNOSIS — M9905 Segmental and somatic dysfunction of pelvic region: Secondary | ICD-10-CM | POA: Diagnosis not present

## 2020-10-15 ENCOUNTER — Telehealth (HOSPITAL_COMMUNITY): Payer: Self-pay

## 2020-10-15 ENCOUNTER — Telehealth: Payer: Self-pay | Admitting: Pharmacy Technician

## 2020-10-15 NOTE — Telephone Encounter (Signed)
(  Fyi) Left v/m with patient in regards to formulation change with nucala (vials).  Will need to f/u

## 2020-10-15 NOTE — Telephone Encounter (Signed)
Patient called stating that she had EKG done in [place] office however she believes she needs to wear a monitor to show her heart rates. She states that since 07/16 HR range from 89-157 and that she has chest tightness.

## 2020-10-16 NOTE — Telephone Encounter (Signed)
Called to discuss symptoms with patient. She feels her afib burden is higher than EKGs in the office reflect. The last 4 days she feels she has been in AF majority of the time with heart rates "all over the place".  She is trying to exercise in water aerobics but her heart rates sometimes prevent her.  Pt questions whether she should wear a heart monitor for a few days to "show what her heart rhythm is doing." Discussed with Roderic Palau NP recommends LINQ as best option for this to help characterize her rhythm/rate. Will forward to Dr. Jackalyn Lombard office to have appt moved up to discuss linq/placement if deemed appropriate. Pt is in agreement with this plan.

## 2020-10-17 DIAGNOSIS — M48061 Spinal stenosis, lumbar region without neurogenic claudication: Secondary | ICD-10-CM | POA: Diagnosis not present

## 2020-10-17 DIAGNOSIS — M9905 Segmental and somatic dysfunction of pelvic region: Secondary | ICD-10-CM | POA: Diagnosis not present

## 2020-10-17 DIAGNOSIS — M9901 Segmental and somatic dysfunction of cervical region: Secondary | ICD-10-CM | POA: Diagnosis not present

## 2020-10-17 DIAGNOSIS — M9903 Segmental and somatic dysfunction of lumbar region: Secondary | ICD-10-CM | POA: Diagnosis not present

## 2020-10-17 DIAGNOSIS — M9902 Segmental and somatic dysfunction of thoracic region: Secondary | ICD-10-CM | POA: Diagnosis not present

## 2020-10-17 DIAGNOSIS — M542 Cervicalgia: Secondary | ICD-10-CM | POA: Diagnosis not present

## 2020-10-17 DIAGNOSIS — M546 Pain in thoracic spine: Secondary | ICD-10-CM | POA: Diagnosis not present

## 2020-10-17 NOTE — Telephone Encounter (Signed)
Patient have agreed to call 40 min prior to confirm all future appt due to the nucala change (vials) which will now be mixed.

## 2020-10-23 ENCOUNTER — Ambulatory Visit (INDEPENDENT_AMBULATORY_CARE_PROVIDER_SITE_OTHER): Payer: Medicare Other

## 2020-10-23 ENCOUNTER — Other Ambulatory Visit: Payer: Self-pay

## 2020-10-23 VITALS — BP 146/52 | HR 58 | Temp 98.0°F | Resp 18

## 2020-10-23 DIAGNOSIS — J4520 Mild intermittent asthma, uncomplicated: Secondary | ICD-10-CM

## 2020-10-23 MED ORDER — MEPOLIZUMAB 100 MG/ML ~~LOC~~ SOSY
100.0000 mg | PREFILLED_SYRINGE | Freq: Once | SUBCUTANEOUS | Status: DC
  Filled 2020-10-23: qty 1

## 2020-10-23 MED ORDER — SODIUM CHLORIDE 0.9 % IV SOLN: Freq: Once | INTRAVENOUS | Status: DC | PRN

## 2020-10-23 MED ORDER — ALBUTEROL SULFATE HFA 108 (90 BASE) MCG/ACT IN AERS: 2.0000 | INHALATION_SPRAY | Freq: Once | RESPIRATORY_TRACT | Status: DC | PRN

## 2020-10-23 MED ORDER — DIPHENHYDRAMINE HCL 50 MG/ML IJ SOLN: 50.0000 mg | Freq: Once | INTRAMUSCULAR | Status: DC | PRN

## 2020-10-23 MED ORDER — FAMOTIDINE IN NACL 20-0.9 MG/50ML-% IV SOLN: 20.0000 mg | Freq: Once | INTRAVENOUS | Status: DC | PRN

## 2020-10-23 MED ORDER — EPINEPHRINE 0.3 MG/0.3ML IJ SOAJ: 0.3000 mg | Freq: Once | INTRAMUSCULAR | Status: DC | PRN

## 2020-10-23 MED ORDER — METHYLPREDNISOLONE SODIUM SUCC 125 MG IJ SOLR: 125.0000 mg | Freq: Once | INTRAMUSCULAR | Status: DC | PRN

## 2020-10-23 MED ORDER — MEPOLIZUMAB 100 MG ~~LOC~~ SOLR
100.0000 mg | Freq: Once | SUBCUTANEOUS | Status: AC
  Administered 2020-10-23: 100 mg via SUBCUTANEOUS
  Filled 2020-10-23: qty 1

## 2020-10-23 NOTE — Progress Notes (Signed)
Diagnosis: Asthma  Provider:  Marshell Garfinkel, MD  Procedure: Injection  Nucala (Mepolizumab), Dose: 100 mg, Site: subcutaneous left arm  Discharge: Condition: Good, Destination: Home . AVS declined by patient.   Performed by:  Corinthia Helmers, Sherlon Handing, LPN

## 2020-10-25 DIAGNOSIS — I10 Essential (primary) hypertension: Secondary | ICD-10-CM | POA: Diagnosis not present

## 2020-10-30 ENCOUNTER — Encounter: Payer: Self-pay | Admitting: Podiatry

## 2020-10-30 ENCOUNTER — Ambulatory Visit (INDEPENDENT_AMBULATORY_CARE_PROVIDER_SITE_OTHER): Payer: Medicare Other | Admitting: Podiatry

## 2020-10-30 ENCOUNTER — Other Ambulatory Visit: Payer: Self-pay

## 2020-10-30 DIAGNOSIS — M79674 Pain in right toe(s): Secondary | ICD-10-CM

## 2020-10-30 DIAGNOSIS — B351 Tinea unguium: Secondary | ICD-10-CM | POA: Diagnosis not present

## 2020-10-30 DIAGNOSIS — M79675 Pain in left toe(s): Secondary | ICD-10-CM | POA: Diagnosis not present

## 2020-11-01 ENCOUNTER — Ambulatory Visit: Payer: Medicare Other | Admitting: Podiatry

## 2020-11-01 DIAGNOSIS — J449 Chronic obstructive pulmonary disease, unspecified: Secondary | ICD-10-CM | POA: Diagnosis not present

## 2020-11-01 DIAGNOSIS — I4891 Unspecified atrial fibrillation: Secondary | ICD-10-CM | POA: Diagnosis not present

## 2020-11-01 DIAGNOSIS — J019 Acute sinusitis, unspecified: Secondary | ICD-10-CM | POA: Diagnosis not present

## 2020-11-01 DIAGNOSIS — Z6835 Body mass index (BMI) 35.0-35.9, adult: Secondary | ICD-10-CM | POA: Diagnosis not present

## 2020-11-01 DIAGNOSIS — Z299 Encounter for prophylactic measures, unspecified: Secondary | ICD-10-CM | POA: Diagnosis not present

## 2020-11-01 DIAGNOSIS — J45909 Unspecified asthma, uncomplicated: Secondary | ICD-10-CM | POA: Diagnosis not present

## 2020-11-03 NOTE — Progress Notes (Signed)
Subjective: [name] [name] is a pleasant 80 y.o. female patient seen today painful thick toenails that are difficult to trim. Pain interferes with ambulation. Aggravating factors include wearing enclosed shoe gear. Pain is relieved with periodic professional debridement.  She states her tick bites resolved since her last visit. She voices no new pedal problems on today's visit.  [name] did have ablation performed for atrial flutter in June.   PCP is Monico Blitz, MD. Last visit was: 04/10/2020.  Allergies  Allergen Reactions   Bupropion Hcl Other (See Comments)    Suicidal Thoughts.    Escitalopram Oxalate Other (See Comments)    Suicidal Thoughts.    Fluticasone-Salmeterol Other (See Comments)    Advair - Caused patient to go into Afib.    Lisinopril Cough   Serevent Other (See Comments)    Caused patient to go into Afib    Objective: Physical Exam  General: [name] [name] is a pleasant 80 y.o. Caucasian female, morbidly obese in NAD. AAO x 3.   Vascular:  Capillary refill time to digits immediate b/l. Palpable pedal pulses b/l LE. Pedal hair sparse. Lower extremity skin temperature gradient within normal limits. Trace edema noted b/l lower extremities.  Dermatological:  Pedal skin with normal turgor, texture and tone b/l lower extremities. No open wounds b/l lower extremities. No interdigital macerations b/l lower extremities. Toenails 1-5 b/l elongated, discolored, dystrophic, thickened, crumbly with subungual debris and tenderness to dorsal palpation.  Musculoskeletal:  Normal muscle strength 5/5 to all lower extremity muscle groups bilaterally. Hammertoe(s) noted to the 2-5 bilaterally. Pes planus deformity noted b/l lower extremities.  Neurological:  Protective sensation intact 5/5 intact bilaterally with 10g monofilament b/l. Vibratory sensation intact b/l.  Assessment and Plan:  1. Pain due to onychomycosis of toenails of both feet      -No new  findings. No new orders. -Patient to continue soft, supportive shoe gear daily. -Toenails 1-5 b/l were debrided in length and girth with sterile nail nippers and dremel without iatrogenic bleeding.  -Patient to report any pedal injuries to medical professional immediately. -Patient/POA to call should there be question/concern in the interim.  Return in about 9 weeks (around 01/01/2021).  Marzetta Board, DPM

## 2020-11-08 DIAGNOSIS — M9902 Segmental and somatic dysfunction of thoracic region: Secondary | ICD-10-CM | POA: Diagnosis not present

## 2020-11-08 DIAGNOSIS — M9903 Segmental and somatic dysfunction of lumbar region: Secondary | ICD-10-CM | POA: Diagnosis not present

## 2020-11-08 DIAGNOSIS — M9905 Segmental and somatic dysfunction of pelvic region: Secondary | ICD-10-CM | POA: Diagnosis not present

## 2020-11-08 DIAGNOSIS — M9901 Segmental and somatic dysfunction of cervical region: Secondary | ICD-10-CM | POA: Diagnosis not present

## 2020-11-08 DIAGNOSIS — M546 Pain in thoracic spine: Secondary | ICD-10-CM | POA: Diagnosis not present

## 2020-11-08 DIAGNOSIS — M48061 Spinal stenosis, lumbar region without neurogenic claudication: Secondary | ICD-10-CM | POA: Diagnosis not present

## 2020-11-08 DIAGNOSIS — M542 Cervicalgia: Secondary | ICD-10-CM | POA: Diagnosis not present

## 2020-11-11 ENCOUNTER — Telehealth (INDEPENDENT_AMBULATORY_CARE_PROVIDER_SITE_OTHER): Payer: Medicare Other | Admitting: Cardiology

## 2020-11-11 DIAGNOSIS — I4891 Unspecified atrial fibrillation: Secondary | ICD-10-CM

## 2020-11-11 NOTE — Telephone Encounter (Signed)
Pt came into office and is currently sitting in the lobby- stated she had to go out of town this morning and someone else drove her and while on the way there her Oxygen dropped to 82 and her pulse was in the 30's   States now her Pulse ox isn't reading anything.   Currently waiting in the lobby

## 2020-11-11 NOTE — Telephone Encounter (Signed)
Patient notified and verbalized understanding. 

## 2020-11-11 NOTE — Telephone Encounter (Signed)
Patient brought back for triage.  BP - 112/78  HR 38 via pulse ox.  Sat 97-99%   EKG done - HR at 114 - Afib w/ RVR.    Phone call placed to AFib clinic.    Per Roderic Palau, NP - have her take her as needed Diltiazem '30mg'$  every 4 hours as needed if systolic BP greater than [id number] & have her call their office later this evening or in the morning for update on how she is doing.

## 2020-11-12 ENCOUNTER — Telehealth (HOSPITAL_COMMUNITY): Payer: Self-pay

## 2020-11-12 DIAGNOSIS — Z6835 Body mass index (BMI) 35.0-35.9, adult: Secondary | ICD-10-CM | POA: Diagnosis not present

## 2020-11-12 DIAGNOSIS — I10 Essential (primary) hypertension: Secondary | ICD-10-CM | POA: Diagnosis not present

## 2020-11-12 DIAGNOSIS — J45909 Unspecified asthma, uncomplicated: Secondary | ICD-10-CM | POA: Diagnosis not present

## 2020-11-12 DIAGNOSIS — Z299 Encounter for prophylactic measures, unspecified: Secondary | ICD-10-CM | POA: Diagnosis not present

## 2020-11-12 DIAGNOSIS — L0291 Cutaneous abscess, unspecified: Secondary | ICD-10-CM | POA: Diagnosis not present

## 2020-11-12 DIAGNOSIS — J019 Acute sinusitis, unspecified: Secondary | ICD-10-CM | POA: Diagnosis not present

## 2020-11-12 DIAGNOSIS — L0390 Cellulitis, unspecified: Secondary | ICD-10-CM | POA: Diagnosis not present

## 2020-11-12 NOTE — Telephone Encounter (Signed)
Patient called and states she has been in a-fib since 8/11 off and on. She finished Doxycycline antibiotic yesterday for a sinus infection and abscess on the right thigh. Patient is real short of breath, some headache, sinus pressure and has felt real nauseous since Saturday.This  could be related to her reflux and the mucous draining in her throat. Her doctor prescribed Zofran for nausea and she has been taking this medication and it seems to be helping some. Yesterday she had taken Diltiazem '30mg'$  several times a day- at 12:30pm and 4:30pm. Patient went back into normal sinus rhythm on 8/15 at 6:00pm. Her Blood pressure is 150/68 and her heart rate is 67. Consulted with Roderic Palau NP regarding her care. She is seeing Dr. Manuella Ghazi today for appointment. Patient will follow up with the A-fib clinic to let us know regarding her visit with Dr. Manuella Ghazi today.

## 2020-11-12 NOTE — Telephone Encounter (Signed)
Patient called back after her visit today with Dr. Manuella Ghazi and he has given her another one week course of Doxycyline to help treat her sinus infection and her abscess on her thigh. She will contact our clinic back if she has further concerns.

## 2020-11-13 DIAGNOSIS — M9903 Segmental and somatic dysfunction of lumbar region: Secondary | ICD-10-CM | POA: Diagnosis not present

## 2020-11-13 DIAGNOSIS — M546 Pain in thoracic spine: Secondary | ICD-10-CM | POA: Diagnosis not present

## 2020-11-13 DIAGNOSIS — M48061 Spinal stenosis, lumbar region without neurogenic claudication: Secondary | ICD-10-CM | POA: Diagnosis not present

## 2020-11-13 DIAGNOSIS — M9901 Segmental and somatic dysfunction of cervical region: Secondary | ICD-10-CM | POA: Diagnosis not present

## 2020-11-13 DIAGNOSIS — M542 Cervicalgia: Secondary | ICD-10-CM | POA: Diagnosis not present

## 2020-11-13 DIAGNOSIS — M9905 Segmental and somatic dysfunction of pelvic region: Secondary | ICD-10-CM | POA: Diagnosis not present

## 2020-11-13 DIAGNOSIS — M9902 Segmental and somatic dysfunction of thoracic region: Secondary | ICD-10-CM | POA: Diagnosis not present

## 2020-11-18 DIAGNOSIS — M9901 Segmental and somatic dysfunction of cervical region: Secondary | ICD-10-CM | POA: Diagnosis not present

## 2020-11-18 DIAGNOSIS — M48061 Spinal stenosis, lumbar region without neurogenic claudication: Secondary | ICD-10-CM | POA: Diagnosis not present

## 2020-11-18 DIAGNOSIS — M9903 Segmental and somatic dysfunction of lumbar region: Secondary | ICD-10-CM | POA: Diagnosis not present

## 2020-11-18 DIAGNOSIS — M542 Cervicalgia: Secondary | ICD-10-CM | POA: Diagnosis not present

## 2020-11-18 DIAGNOSIS — M9902 Segmental and somatic dysfunction of thoracic region: Secondary | ICD-10-CM | POA: Diagnosis not present

## 2020-11-18 DIAGNOSIS — M546 Pain in thoracic spine: Secondary | ICD-10-CM | POA: Diagnosis not present

## 2020-11-18 DIAGNOSIS — M9905 Segmental and somatic dysfunction of pelvic region: Secondary | ICD-10-CM | POA: Diagnosis not present

## 2020-11-19 ENCOUNTER — Telehealth: Payer: Self-pay | Admitting: Internal Medicine

## 2020-11-19 NOTE — Telephone Encounter (Signed)
Patient stated she was returning call. Explain to the patient it looks like we where just calling to reminder call of her appt on friday

## 2020-11-20 ENCOUNTER — Other Ambulatory Visit: Payer: Self-pay

## 2020-11-20 ENCOUNTER — Ambulatory Visit (INDEPENDENT_AMBULATORY_CARE_PROVIDER_SITE_OTHER): Payer: Medicare Other

## 2020-11-20 VITALS — BP 127/74 | HR 72 | Temp 97.7°F | Resp 20

## 2020-11-20 DIAGNOSIS — J4520 Mild intermittent asthma, uncomplicated: Secondary | ICD-10-CM

## 2020-11-20 MED ORDER — ALBUTEROL SULFATE HFA 108 (90 BASE) MCG/ACT IN AERS: 2.0000 | INHALATION_SPRAY | Freq: Once | RESPIRATORY_TRACT | Status: DC | PRN

## 2020-11-20 MED ORDER — METHYLPREDNISOLONE SODIUM SUCC 125 MG IJ SOLR: 125.0000 mg | Freq: Once | INTRAMUSCULAR | Status: DC | PRN

## 2020-11-20 MED ORDER — EPINEPHRINE 0.3 MG/0.3ML IJ SOAJ: 0.3000 mg | Freq: Once | INTRAMUSCULAR | Status: DC | PRN

## 2020-11-20 MED ORDER — DIPHENHYDRAMINE HCL 50 MG/ML IJ SOLN: 50.0000 mg | Freq: Once | INTRAMUSCULAR | Status: DC | PRN

## 2020-11-20 MED ORDER — FAMOTIDINE IN NACL 20-0.9 MG/50ML-% IV SOLN: 20.0000 mg | Freq: Once | INTRAVENOUS | Status: DC | PRN

## 2020-11-20 MED ORDER — SODIUM CHLORIDE 0.9 % IV SOLN: Freq: Once | INTRAVENOUS | Status: DC | PRN

## 2020-11-20 MED ORDER — MEPOLIZUMAB 100 MG ~~LOC~~ SOLR
100.0000 mg | Freq: Once | SUBCUTANEOUS | Status: AC
  Administered 2020-11-20: 100 mg via SUBCUTANEOUS
  Filled 2020-11-20: qty 1

## 2020-11-20 NOTE — Progress Notes (Signed)
Diagnosis: Asthma  Provider:  Marshell Garfinkel, MD  Procedure: Injection  Nucala (Mepolizumab), Dose: 100 mg, Site: subcutaneous rt arm  Discharge: Condition: Good, Destination: Home . AVS provided to patient.   Performed by:  Kenli Waldo, Sherlon Handing, LPN

## 2020-11-21 ENCOUNTER — Encounter: Payer: Self-pay | Admitting: Internal Medicine

## 2020-11-22 ENCOUNTER — Other Ambulatory Visit: Payer: Self-pay

## 2020-11-22 ENCOUNTER — Ambulatory Visit (INDEPENDENT_AMBULATORY_CARE_PROVIDER_SITE_OTHER): Payer: Medicare Other | Admitting: Internal Medicine

## 2020-11-22 ENCOUNTER — Encounter: Payer: Self-pay | Admitting: *Deleted

## 2020-11-22 ENCOUNTER — Encounter: Payer: Self-pay | Admitting: Internal Medicine

## 2020-11-22 VITALS — BP 122/60 | HR 113 | Ht 60.0 in | Wt 189.0 lb

## 2020-11-22 DIAGNOSIS — I10 Essential (primary) hypertension: Secondary | ICD-10-CM

## 2020-11-22 DIAGNOSIS — I483 Typical atrial flutter: Secondary | ICD-10-CM | POA: Diagnosis not present

## 2020-11-22 DIAGNOSIS — R001 Bradycardia, unspecified: Secondary | ICD-10-CM | POA: Diagnosis not present

## 2020-11-22 DIAGNOSIS — I4819 Other persistent atrial fibrillation: Secondary | ICD-10-CM

## 2020-11-22 HISTORY — PX: OTHER SURGICAL HISTORY: SHX169

## 2020-11-22 MED ORDER — DILTIAZEM HCL ER COATED BEADS 120 MG PO CP24: 120.0000 mg | ORAL_CAPSULE | Freq: Two times a day (BID) | ORAL | 3 refills | Status: DC

## 2020-11-22 NOTE — Patient Instructions (Addendum)
Medication Instructions:  Increase Diltiazem to 120 mg two times a day Your physician recommends that you continue on your current medications as directed. Please refer to the Current Medication list given to you today.  Labwork: None ordered.  Testing/Procedures: None ordered.  Follow-Up: You are scheduled for a Cardioversion on Tuesday Aug. 30 with Dr. Harrington Challenger.  Please arrive at the [facility] (Main Entrance A) at [facility]: [address] [place], [place] [address] at 9 am .   DIET: Nothing to eat or drink after midnight except a sip of water with medications (see medication instructions below)  FYI: For your safety, and to allow [place] to monitor your vital signs accurately during the surgery/procedure we request that   if you have artificial nails, gel coating, SNS etc. Please have those removed prior to your surgery/procedure. Not having the nail coverings /polish removed may result in cancellation or delay of your surgery/procedure.   Medication Instructions: Take all morning medications  Continue your anticoagulant: Xarelto You will need to continue your anticoagulant after your procedure until you  are told by your  Provider that it is safe to stop   You must have a responsible person to drive you home and stay in the waiting area during your procedure. Failure to do so could result in cancellation.  Bring your insurance cards.  *Special Note: Every effort is made to have your procedure done on time. Occasionally there are emergencies that occur at the hospital that may cause delays. Please be patient if a delay does occur.    Your physician wants you to follow-up in: Afib Clinic will contact you to schedule 1 week post cardioversion. 01/08/21 at 3:30 pm with Dr. Rayann Heman.    Implantable Loop Recorder Placement, Care After This sheet gives you information about how to care for yourself after your procedure. Your health care provider may also give you more specific  instructions. If you have problems or questions, contact your health care provider. What can I expect after the procedure? After the procedure, it is common to have: Soreness or discomfort near the incision. Some swelling or bruising near the incision.  Follow these instructions at home: Incision care   Leave your outer dressing on for 24 hours.  After 24 hours you can remove your outer dressing and shower. Leave adhesive strips in place. These skin closures may need to stay in place for 1-2 weeks. If adhesive strip edges start to loosen and curl up, you may trim the loose edges.  You may remove the strips if they have not fallen off after 2 weeks. Check your incision area every day for signs of infection. Check for: Redness, swelling, or pain. Fluid or blood. Warmth. Pus or a bad smell. Do not take baths, swim, or use a hot tub until your incision is completely healed. If your wound site starts to bleed apply pressure.      If you have any questions/concerns please call the device clinic at [phone].  Activity  Return to your normal activities.  General instructions Follow instructions from your health care provider about how to manage your implantable loop recorder and transmit the information. Learn how to activate a recording if this is necessary for your type of device. Do not go through a metal detection gate, and do not let someone hold a metal detector over your chest. Show your ID card. Do not have an MRI unless you check with your health care provider first. Take over-the-counter and  prescription medicines only as told by your health care provider. Keep all follow-up visits as told by your health care provider. This is important. Contact a health care provider if: You have redness, swelling, or pain around your incision. You have a fever. You have pain that is not relieved by your pain medicine. You have triggered your device because of fainting (syncope) or because  of a heartbeat that feels like it is racing, slow, fluttering, or skipping (palpitations). Get help right away if you have: Chest pain. Difficulty breathing. Summary After the procedure, it is common to have soreness or discomfort near the incision. Change your dressing as told by your health care provider. Follow instructions from your health care provider about how to manage your implantable loop recorder and transmit the information. Keep all follow-up visits as told by your health care provider. This is important. This information is not intended to replace advice given to you by your health care provider. Make sure you discuss any questions you have with your health care provider. Document Released: 02/25/2015 Document Revised: 05/01/2017 Document Reviewed: 05/01/2017 Elsevier Patient Education  2020 [employer].

## 2020-11-22 NOTE — H&P (View-Only) (Signed)
PCP: Monico Blitz, MD Primary Cardiologist: Dr Domenic Polite Primary EP: Dr Rayann Heman  [name] is a 80 y.o. female who presents today for routine electrophysiology followup.  Since her ablation the patient reports doing reasonably well.  She has developed ERAF.  + tachypalpitations and worsening SOB.  Today, she denies symptoms of  lower extremity edema, dizziness, presyncope, or syncope.  The patient is otherwise without complaint today.   Past Medical History:  Diagnosis Date   Allergic rhinitis    Anxiety    Asthma    Atrial flutter (HCC)    COPD (chronic obstructive pulmonary disease) ([place])    Depression    Essential hypertension    Hyperlipidemia    Lumbar disc disease    Obesity    OSA (obstructive sleep apnea)    CPAP   Paroxysmal atrial fibrillation (HCC)    Element of tachycardia bradycardia syndrome   Respiratory failure ([place])    Past Surgical History:  Procedure Laterality Date   ANTERIOR FUSION LUMBAR SPINE     ATRIAL FIBRILLATION ABLATION N/A 09/12/2020   Procedure: ATRIAL FIBRILLATION ABLATION;  Surgeon: Thompson Grayer, MD;  Location: [place] [facility];  Service: Cardiovascular;  Laterality: N/A;   BIOPSY  08/27/2016   Procedure: BIOPSY;  Surgeon: Rogene Houston, MD;  [facility];  Service: Endoscopy;;  FOUR GASTRIC POLYPS BIOPSIED   CARDIOVERSION N/A 03/28/2014   Procedure: CARDIOVERSION;  Surgeon: Fay Records, MD;  [facility]: [facility];  Service: Cardiovascular;  Laterality: N/A;   CARDIOVERSION N/A 10/22/2016   Procedure: CARDIOVERSION;  Surgeon: Sueanne Margarita, MD;  Location: [place];  Service: Cardiovascular;  Laterality: N/A;   CATARACT EXTRACTION     COLONOSCOPY N/A 08/04/2012   Procedure: COLONOSCOPY;  Surgeon: Rogene Houston, MD;  [facility];  Service: Endoscopy;  Laterality: N/A;  730-rescheduled to [place] notified pt   ELECTROPHYSIOLOGIC STUDY N/A 09/18/2014   Procedure: Atrial Fibrillation Ablation;  Surgeon: Thompson Grayer, MD;  Location: [place] [facility];  Service: Cardiovascular;  Laterality: N/A;   ESOPHAGOGASTRODUODENOSCOPY N/A 08/27/2016   Procedure: ESOPHAGOGASTRODUODENOSCOPY (EGD);  Surgeon: Rogene Houston, MD;  [facility];  Service: Endoscopy;  Laterality: N/A;  1200   LASIK     Both eyes   TEE WITHOUT CARDIOVERSION N/A 09/18/2014   Procedure: TRANSESOPHAGEAL ECHOCARDIOGRAM (TEE);  Surgeon: Larey Dresser, MD;  Location: [place];  Service: Cardiovascular;  Laterality: N/A;   TEE WITHOUT CARDIOVERSION N/A 10/22/2016   Procedure: TRANSESOPHAGEAL ECHOCARDIOGRAM (TEE);  Surgeon: Sueanne Margarita, MD;  Location: [facility];  Service: Cardiovascular;  Laterality: N/A;   TEE WITHOUT CARDIOVERSION N/A 08/24/2017   Procedure: TRANSESOPHAGEAL ECHOCARDIOGRAM (TEE);  Surgeon: Josue Hector, MD;  Location: [facility];  Service: Cardiovascular;  Laterality: N/A;   TOTAL HIP ARTHROPLASTY  2010   Right    ROS- all systems are reviewed and negatives except as per HPI above  Current Outpatient Medications  Medication Sig Dispense Refill   acetaminophen (TYLENOL) 500 MG tablet Take 500 mg by mouth daily as needed for headache.     antiseptic oral rinse (BIOTENE) LIQD 15 mLs by Mouth Rinse route daily as needed for dry mouth.     atorvastatin (LIPITOR) 10 MG tablet Take 10 mg by mouth daily at 6 PM.     benzonatate (TESSALON) 200 MG capsule Take 1 capsule (200 mg total) by mouth 3 (three) times daily as needed for cough. 30 capsule 3  Biotin 1 MG CAPS Take 1,000 mg by mouth 3 (three) times a week.      Budeson-Glycopyrrol-Formoterol (BREZTRI AEROSPHERE) 160-9-4.8 MCG/ACT AERO Inhale 2 puffs into the lungs 2 (two) times daily. 10.7 g 0   budesonide (PULMICORT) 0.5 MG/2ML nebulizer solution Take 0.5 mg by nebulization daily.      Calcium Carbonate-Vit D-Min (QC CALCIUM-MAGNESIUM-ZINC-D3) 333.4-133 MG-UNIT TABS Take 1 tablet by mouth 2 (two) times daily.      cetirizine (ZYRTEC) 10 MG tablet  Take 10 mg by mouth at bedtime as needed for allergies.     clindamycin (CLEOCIN) 300 MG capsule Take 2 capsules by mouth as needed. Take prior to dental procedures  0   DERMA-SMOOTHE/FS SCALP 0.01 % OIL Apply 1 application topically 3 (three) times a week.     Dextromethorphan-guaiFENesin (MUCINEX DM PO) Take 1 tablet by mouth daily as needed (respiratory).     diltiazem (CARDIZEM CD) 120 MG 24 hr capsule TAKE ONE CAPSULE BY MOUTH AT BEDTIME (Patient taking differently: Take 120 mg by mouth daily at 6 PM.) 90 capsule 2   diltiazem (CARDIZEM) 30 MG tablet TAKE 1 TABLET BY MOUTH AS NEEDED FOR PALPITATIONS (Patient taking differently: Take 30 mg by mouth daily as needed (NEEDED FOR PALPITATIONS). T) 45 tablet 2   dofetilide (TIKOSYN) 500 MCG capsule Take 1 capsule (500 mcg total) by mouth 2 (two) times daily. 180 capsule 1   EPINEPHrine 0.3 mg/0.3 mL IJ SOAJ injection Inject 0.3 mg into the muscle as needed for anaphylaxis.     famotidine (PEPCID) 20 MG tablet Take 2 tablets (40 mg total) by mouth at bedtime as needed. 30 tablet 1   fluticasone (FLONASE) 50 MCG/ACT nasal spray Place 1 spray into both nostrils 2 (two) times daily.     hydrALAZINE (APRESOLINE) 100 MG tablet Take 100 mg by mouth 2 (two) times daily.     HYDROCODONE-ACETAMINOPHEN PO Take 5 mLs by mouth every 6 (six) hours as needed for moderate pain. Hydromet  5 mg/1.5 mg     ipratropium-albuterol (DUONEB) 0.5-2.5 (3) MG/3ML SOLN Take 3 mLs by nebulization 4 (four) times daily as needed (Shortness of breathing).     ketoconazole (NIZORAL) 2 % shampoo Apply 1 application topically 3 (three) times a week.     Magnesium 250 MG TABS Take 250 mg by mouth daily.     montelukast (SINGULAIR) 10 MG tablet Take 10 mg by mouth every Friday at 6 PM.     pantoprazole (PROTONIX) 40 MG tablet Take 1 tablet (40 mg total) by mouth in the morning. Take 40 mg by mouth in the morning. 90 tablet 3   potassium chloride SA (KLOR-CON) 20 MEQ tablet Take 1 tablet  (20 mEq total) by mouth daily. 90 tablet 3   Rivaroxaban (XARELTO) 15 MG TABS tablet Take 1 tablet (15 mg total) by mouth daily with supper. 90 tablet 3   Sodium Chloride-Sodium Bicarb (AYR SALINE NASAL NETI RINSE) 1.57 g PACK Place 1 each into the nose daily as needed (rinse nasal often). Neil sinus rinse     umeclidinium-vilanterol (ANORO ELLIPTA) 62.5-25 MCG/INH AEPB INHALE ONE PUFF INTO THE LUNGS DAILY (Patient taking differently: Inhale 1 puff into the lungs daily.) 60 each 11   Wheat Dextrin (BENEFIBER ON THE GO) POWD Take 10 mLs by mouth daily.     losartan (COZAAR) 100 MG tablet Take 1 tablet (100 mg total) by mouth daily. (Patient taking differently: Take 50 mg by mouth daily.) 90 tablet 2  Current Facility-Administered Medications  Medication Dose Route Frequency Provider Last Rate Last Admin   Mepolizumab SOLR 100 mg  100 mg Subcutaneous Q28 days Baird Lyons D, MD   100 mg at 09/07/19 0947   Mepolizumab SOLR 100 mg  100 mg Subcutaneous Q28 days Baird Lyons D, MD   100 mg at 11/02/19 1123    Physical Exam: Vitals:   11/22/20 1029  BP: 122/60  Pulse: (!) 113  SpO2: 97%  Weight: 189 lb (85.7 kg)  Height: 5' (1.524 m)    GEN- The patient is well appearing, alert and oriented x 3 today.   Head- normocephalic, atraumatic Eyes-  Sclera clear, conjunctiva pink Ears- hearing intact Oropharynx- clear Lungs- Clear to ausculation bilaterally, normal work of breathing Heart-  IRRR GI- soft, NT, ND, + BS Extremities- no clubbing, cyanosis, or edema  Wt Readings from Last 3 Encounters:  11/22/20 189 lb (85.7 kg)  10/02/20 191 lb (86.6 kg)  09/12/20 190 lb (86.2 kg)    EKG tracing ordered today is personally reviewed and shows afib  Assessment and Plan:  Persistent afib She has had ERAF post ablation.  She is very concerns.  Reports compliance with xarelto without interruption. I would therefore advise implantation of an implantable loop recorder for long term  arrhythmia monitoring to further manage her afib post ablation.  Risks and benefits to ILR were discussed at length with the patient today, including but not limited to risks of bleeding and infection.  Extensive device education was performed.  Remote monitoring was also discussed at length today.  The patient understands and wishes to proceed.  We will proceed at this time with ILR implantation. We I have discussed risk of cardioversion today also. She wishes to proceed.  We will therefore arrange [facility] at the next available time. Continue tikosyn Increase diltiazem CD to '120mg'$  BID today  2. Sinus bradycardia We will follow with ILR  3. Obesity Body mass index is 36.91 kg/m. Lifestyle modification advised  4. HTN Stable No change required today   Follow-up in AF clinic 1 week post [place] MD, [facility] 11/22/2020 10:46 AM      DESCRIPTION OF PROCEDURE:  Informed written consent was obtained.  The patient required no sedation for the procedure today.  The patients left chest was prepped and draped. Mapping over the patient's chest was performed to identify the appropriate ILR site.  This area was found to be the left parasternal region over the 3rd-4th intercostal space.  The skin overlying this region was infiltrated with lidocaine for local analgesia.  A 0.5-cm incision was made at the implant site.  A subcutaneous ILR pocket was fashioned using a combination of sharp and blunt dissection.  A Medtronic Reveal Linq model LNQ 11 (SN [id number]) implantable loop recorder was then placed into the pocket R waves were very prominent and measured > 0.2 mV. EBL<1 ml.  Steri- Strips and a sterile dressing were then applied.  There were no early apparent complications.     CONCLUSIONS:   1. Successful implantation of a Medtronic Reveal LINQ implantable loop recorder for afib management post ablation  2. No early apparent complications.   Thompson Grayer MD, [facility] 11/22/2020 10:46 AM

## 2020-11-22 NOTE — Progress Notes (Signed)
PCP: Monico Blitz, MD Primary Cardiologist: Dr Domenic Polite Primary EP: Dr Rayann Heman  [name] [name] is a 80 y.o. female who presents today for routine electrophysiology followup.  Since her ablation the patient reports doing reasonably well.  She has developed ERAF.  + tachypalpitations and worsening SOB.  Today, she denies symptoms of  lower extremity edema, dizziness, presyncope, or syncope.  The patient is otherwise without complaint today.   Past Medical History:  Diagnosis Date   Allergic rhinitis    Anxiety    Asthma    Atrial flutter (HCC)    COPD (chronic obstructive pulmonary disease) ([place])    Depression    Essential hypertension    Hyperlipidemia    Lumbar disc disease    Obesity    OSA (obstructive sleep apnea)    CPAP   Paroxysmal atrial fibrillation (HCC)    Element of tachycardia bradycardia syndrome   Respiratory failure ([place])    Past Surgical History:  Procedure Laterality Date   ANTERIOR FUSION LUMBAR SPINE     ATRIAL FIBRILLATION ABLATION N/A 09/12/2020   Procedure: ATRIAL FIBRILLATION ABLATION;  Surgeon: Thompson Grayer, MD;  Location: [place] [facility];  Service: Cardiovascular;  Laterality: N/A;   BIOPSY  08/27/2016   Procedure: BIOPSY;  Surgeon: Rogene Houston, MD;  [facility];  Service: Endoscopy;;  FOUR GASTRIC POLYPS BIOPSIED   CARDIOVERSION N/A 03/28/2014   Procedure: CARDIOVERSION;  Surgeon: Fay Records, MD;  [facility]: [facility];  Service: Cardiovascular;  Laterality: N/A;   CARDIOVERSION N/A 10/22/2016   Procedure: CARDIOVERSION;  Surgeon: Sueanne Margarita, MD;  Location: [place];  Service: Cardiovascular;  Laterality: N/A;   CATARACT EXTRACTION     COLONOSCOPY N/A 08/04/2012   Procedure: COLONOSCOPY;  Surgeon: Rogene Houston, MD;  [facility];  Service: Endoscopy;  Laterality: N/A;  730-rescheduled to [place] notified pt   ELECTROPHYSIOLOGIC STUDY N/A 09/18/2014   Procedure: Atrial Fibrillation Ablation;  Surgeon: Thompson Grayer, MD;  Location: [place] [facility];  Service: Cardiovascular;  Laterality: N/A;   ESOPHAGOGASTRODUODENOSCOPY N/A 08/27/2016   Procedure: ESOPHAGOGASTRODUODENOSCOPY (EGD);  Surgeon: Rogene Houston, MD;  [facility];  Service: Endoscopy;  Laterality: N/A;  1200   LASIK     Both eyes   TEE WITHOUT CARDIOVERSION N/A 09/18/2014   Procedure: TRANSESOPHAGEAL ECHOCARDIOGRAM (TEE);  Surgeon: Larey Dresser, MD;  Location: [place];  Service: Cardiovascular;  Laterality: N/A;   TEE WITHOUT CARDIOVERSION N/A 10/22/2016   Procedure: TRANSESOPHAGEAL ECHOCARDIOGRAM (TEE);  Surgeon: Sueanne Margarita, MD;  Location: [facility] ENDOSCOPY;  Service: Cardiovascular;  Laterality: N/A;   TEE WITHOUT CARDIOVERSION N/A 08/24/2017   Procedure: TRANSESOPHAGEAL ECHOCARDIOGRAM (TEE);  Surgeon: Josue Hector, MD;  Location: [facility] ENDOSCOPY;  Service: Cardiovascular;  Laterality: N/A;   TOTAL HIP ARTHROPLASTY  2010   Right    ROS- all systems are reviewed and negatives except as per HPI above  Current Outpatient Medications  Medication Sig Dispense Refill   acetaminophen (TYLENOL) 500 MG tablet Take 500 mg by mouth daily as needed for headache.     antiseptic oral rinse (BIOTENE) LIQD 15 mLs by Mouth Rinse route daily as needed for dry mouth.     atorvastatin (LIPITOR) 10 MG tablet Take 10 mg by mouth daily at 6 PM.     benzonatate (TESSALON) 200 MG capsule Take 1 capsule (200 mg total) by mouth 3 (three) times daily as needed for cough. 30 capsule 3  Biotin 1 MG CAPS Take 1,000 mg by mouth 3 (three) times a week.      Budeson-Glycopyrrol-Formoterol (BREZTRI AEROSPHERE) 160-9-4.8 MCG/ACT AERO Inhale 2 puffs into the lungs 2 (two) times daily. 10.7 g 0   budesonide (PULMICORT) 0.5 MG/2ML nebulizer solution Take 0.5 mg by nebulization daily.      Calcium Carbonate-Vit D-Min (QC CALCIUM-MAGNESIUM-ZINC-D3) 333.4-133 MG-UNIT TABS Take 1 tablet by mouth 2 (two) times daily.      cetirizine (ZYRTEC) 10 MG tablet  Take 10 mg by mouth at bedtime as needed for allergies.     clindamycin (CLEOCIN) 300 MG capsule Take 2 capsules by mouth as needed. Take prior to dental procedures  0   DERMA-SMOOTHE/FS SCALP 0.01 % OIL Apply 1 application topically 3 (three) times a week.     Dextromethorphan-guaiFENesin (MUCINEX DM PO) Take 1 tablet by mouth daily as needed (respiratory).     diltiazem (CARDIZEM CD) 120 MG 24 hr capsule TAKE ONE CAPSULE BY MOUTH AT BEDTIME (Patient taking differently: Take 120 mg by mouth daily at 6 PM.) 90 capsule 2   diltiazem (CARDIZEM) 30 MG tablet TAKE 1 TABLET BY MOUTH AS NEEDED FOR PALPITATIONS (Patient taking differently: Take 30 mg by mouth daily as needed (NEEDED FOR PALPITATIONS). T) 45 tablet 2   dofetilide (TIKOSYN) 500 MCG capsule Take 1 capsule (500 mcg total) by mouth 2 (two) times daily. 180 capsule 1   EPINEPHrine 0.3 mg/0.3 mL IJ SOAJ injection Inject 0.3 mg into the muscle as needed for anaphylaxis.     famotidine (PEPCID) 20 MG tablet Take 2 tablets (40 mg total) by mouth at bedtime as needed. 30 tablet 1   fluticasone (FLONASE) 50 MCG/ACT nasal spray Place 1 spray into both nostrils 2 (two) times daily.     hydrALAZINE (APRESOLINE) 100 MG tablet Take 100 mg by mouth 2 (two) times daily.     HYDROCODONE-ACETAMINOPHEN PO Take 5 mLs by mouth every 6 (six) hours as needed for moderate pain. Hydromet  5 mg/1.5 mg     ipratropium-albuterol (DUONEB) 0.5-2.5 (3) MG/3ML SOLN Take 3 mLs by nebulization 4 (four) times daily as needed (Shortness of breathing).     ketoconazole (NIZORAL) 2 % shampoo Apply 1 application topically 3 (three) times a week.     Magnesium 250 MG TABS Take 250 mg by mouth daily.     montelukast (SINGULAIR) 10 MG tablet Take 10 mg by mouth every Friday at 6 PM.     pantoprazole (PROTONIX) 40 MG tablet Take 1 tablet (40 mg total) by mouth in the morning. Take 40 mg by mouth in the morning. 90 tablet 3   potassium chloride SA (KLOR-CON) 20 MEQ tablet Take 1 tablet  (20 mEq total) by mouth daily. 90 tablet 3   Rivaroxaban (XARELTO) 15 MG TABS tablet Take 1 tablet (15 mg total) by mouth daily with supper. 90 tablet 3   Sodium Chloride-Sodium Bicarb (AYR SALINE NASAL NETI RINSE) 1.57 g PACK Place 1 each into the nose daily as needed (rinse nasal often). Neil sinus rinse     umeclidinium-vilanterol (ANORO ELLIPTA) 62.5-25 MCG/INH AEPB INHALE ONE PUFF INTO THE LUNGS DAILY (Patient taking differently: Inhale 1 puff into the lungs daily.) 60 each 11   Wheat Dextrin (BENEFIBER ON THE GO) POWD Take 10 mLs by mouth daily.     losartan (COZAAR) 100 MG tablet Take 1 tablet (100 mg total) by mouth daily. (Patient taking differently: Take 50 mg by mouth daily.) 90 tablet 2  Current Facility-Administered Medications  Medication Dose Route Frequency Provider Last Rate Last Admin   Mepolizumab SOLR 100 mg  100 mg Subcutaneous Q28 days Baird Lyons D, MD   100 mg at 09/07/19 0947   Mepolizumab SOLR 100 mg  100 mg Subcutaneous Q28 days Baird Lyons D, MD   100 mg at 11/02/19 1123    Physical Exam: Vitals:   11/22/20 1029  BP: 122/60  Pulse: (!) 113  SpO2: 97%  Weight: 189 lb (85.7 kg)  Height: 5' (1.524 m)    GEN- The patient is well appearing, alert and oriented x 3 today.   Head- normocephalic, atraumatic Eyes-  Sclera clear, conjunctiva pink Ears- hearing intact Oropharynx- clear Lungs- Clear to ausculation bilaterally, normal work of breathing Heart-  IRRR GI- soft, NT, ND, + BS Extremities- no clubbing, cyanosis, or edema  Wt Readings from Last 3 Encounters:  11/22/20 189 lb (85.7 kg)  10/02/20 191 lb (86.6 kg)  09/12/20 190 lb (86.2 kg)    EKG tracing ordered today is personally reviewed and shows afib  Assessment and Plan:  Persistent afib She has had ERAF post ablation.  She is very concerns.  Reports compliance with xarelto without interruption. I would therefore advise implantation of an implantable loop recorder for long term  arrhythmia monitoring to further manage her afib post ablation.  Risks and benefits to ILR were discussed at length with the patient today, including but not limited to risks of bleeding and infection.  Extensive device education was performed.  Remote monitoring was also discussed at length today.  The patient understands and wishes to proceed.  We will proceed at this time with ILR implantation. We I have discussed risk of cardioversion today also. She wishes to proceed.  We will therefore arrange [name] at the next available time. Continue tikosyn Increase diltiazem CD to '120mg'$  BID today  2. Sinus bradycardia We will follow with ILR  3. Obesity Body mass index is 36.91 kg/m. Lifestyle modification advised  4. HTN Stable No change required today   Follow-up in AF clinic 1 week post [place] MD, [facility] 11/22/2020 10:46 AM      DESCRIPTION OF PROCEDURE:  Informed written consent was obtained.  The patient required no sedation for the procedure today.  The patients left chest was prepped and draped. Mapping over the patient's chest was performed to identify the appropriate ILR site.  This area was found to be the left parasternal region over the 3rd-4th intercostal space.  The skin overlying this region was infiltrated with lidocaine for local analgesia.  A 0.5-cm incision was made at the implant site.  A subcutaneous ILR pocket was fashioned using a combination of sharp and blunt dissection.  A Medtronic Reveal Linq model LNQ 11 (SN [id number]) implantable loop recorder was then placed into the pocket R waves were very prominent and measured > 0.2 mV. EBL<1 ml.  Steri- Strips and a sterile dressing were then applied.  There were no early apparent complications.     CONCLUSIONS:   1. Successful implantation of a Medtronic Reveal LINQ implantable loop recorder for afib management post ablation  2. No early apparent complications.   Thompson Grayer MD, [facility] 11/22/2020 10:46 AM

## 2020-11-26 ENCOUNTER — Other Ambulatory Visit: Payer: Self-pay

## 2020-11-26 ENCOUNTER — Ambulatory Visit (HOSPITAL_COMMUNITY): Payer: Medicare Other | Admitting: Anesthesiology

## 2020-11-26 ENCOUNTER — Ambulatory Visit (HOSPITAL_COMMUNITY)
Admission: RE | Admit: 2020-11-26 | Discharge: 2020-11-26 | Disposition: A | Discharge status: Home / Self Care | Payer: Medicare Other | Attending: Internal Medicine | Admitting: Internal Medicine

## 2020-11-26 ENCOUNTER — Encounter (HOSPITAL_COMMUNITY)
Admission: RE | Disposition: A | Discharge status: Home / Self Care | Payer: Self-pay | Source: Home / Self Care | Attending: Internal Medicine

## 2020-11-26 ENCOUNTER — Ambulatory Visit (HOSPITAL_COMMUNITY)
Admission: RE | Admit: 2020-11-26 | Discharge: 2020-11-26 | Disposition: A | Discharge status: Home / Self Care | Payer: Medicare Other | Source: Ambulatory Visit | Attending: Nurse Practitioner | Admitting: Nurse Practitioner

## 2020-11-26 ENCOUNTER — Encounter (HOSPITAL_COMMUNITY): Payer: Self-pay | Admitting: Internal Medicine

## 2020-11-26 VITALS — BP 128/64 | HR 75 | Ht 60.0 in

## 2020-11-26 DIAGNOSIS — E669 Obesity, unspecified: Secondary | ICD-10-CM | POA: Insufficient documentation

## 2020-11-26 DIAGNOSIS — Z79899 Other long term (current) drug therapy: Secondary | ICD-10-CM | POA: Insufficient documentation

## 2020-11-26 DIAGNOSIS — R001 Bradycardia, unspecified: Secondary | ICD-10-CM | POA: Diagnosis not present

## 2020-11-26 DIAGNOSIS — I4819 Other persistent atrial fibrillation: Secondary | ICD-10-CM | POA: Diagnosis not present

## 2020-11-26 DIAGNOSIS — G4733 Obstructive sleep apnea (adult) (pediatric): Secondary | ICD-10-CM | POA: Diagnosis not present

## 2020-11-26 DIAGNOSIS — Z6836 Body mass index (BMI) 36.0-36.9, adult: Secondary | ICD-10-CM | POA: Diagnosis not present

## 2020-11-26 DIAGNOSIS — I10 Essential (primary) hypertension: Secondary | ICD-10-CM | POA: Diagnosis not present

## 2020-11-26 DIAGNOSIS — Z7901 Long term (current) use of anticoagulants: Secondary | ICD-10-CM | POA: Diagnosis not present

## 2020-11-26 DIAGNOSIS — I4891 Unspecified atrial fibrillation: Secondary | ICD-10-CM | POA: Diagnosis not present

## 2020-11-26 DIAGNOSIS — I4892 Unspecified atrial flutter: Secondary | ICD-10-CM

## 2020-11-26 DIAGNOSIS — J449 Chronic obstructive pulmonary disease, unspecified: Secondary | ICD-10-CM | POA: Diagnosis not present

## 2020-11-26 DIAGNOSIS — I480 Paroxysmal atrial fibrillation: Secondary | ICD-10-CM | POA: Diagnosis not present

## 2020-11-26 DIAGNOSIS — Z9989 Dependence on other enabling machines and devices: Secondary | ICD-10-CM | POA: Diagnosis not present

## 2020-11-26 HISTORY — PX: CARDIOVERSION: SHX1299

## 2020-11-26 LAB — POCT I-STAT, CHEM 8
BUN: 16 mg/dL (ref 8–23)
Calcium, Ion: 1.19 mmol/L (ref 1.15–1.40)
Chloride: 108 mmol/L (ref 98–111)
Creatinine, Ser: 1 mg/dL (ref 0.44–1.00)
Glucose, Bld: 95 mg/dL (ref 70–99)
HCT: 34 % — ABNORMAL LOW (ref 36.0–46.0)
Hemoglobin: 11.6 g/dL — ABNORMAL LOW (ref 12.0–15.0)
Potassium: 4.2 mmol/L (ref 3.5–5.1)
Sodium: 140 mmol/L (ref 135–145)
TCO2: 23 mmol/L (ref 22–32)

## 2020-11-26 SURGERY — CARDIOVERSION
Anesthesia: General

## 2020-11-26 MED ORDER — PROPOFOL 10 MG/ML IV BOLUS
INTRAVENOUS | Status: DC | PRN
  Administered 2020-11-26: 80 mg via INTRAVENOUS

## 2020-11-26 MED ORDER — LIDOCAINE HCL (CARDIAC) PF 100 MG/5ML IV SOSY
PREFILLED_SYRINGE | INTRAVENOUS | Status: DC | PRN
  Administered 2020-11-26: 20 mg via INTRATRACHEAL

## 2020-11-26 MED ORDER — SODIUM CHLORIDE 0.9 % IV SOLN: INTRAVENOUS | Status: DC | PRN

## 2020-11-26 NOTE — Interval H&P Note (Signed)
History and Physical Interval Note:  11/26/2020 9:42 AM  [name] [name]  has presented today for surgery, with the diagnosis of AFIB.  The various methods of treatment have been discussed with the patient and family. After consideration of risks, benefits and other options for treatment, the patient has consented to  Procedure(s): CARDIOVERSION (N/A) as a surgical intervention.  The patient's history has been reviewed, patient examined, no change in status, stable for surgery.  I have reviewed the patient's chart and labs.  Questions were answered to the patient's satisfaction.     [name]

## 2020-11-26 NOTE — Progress Notes (Addendum)
Pt is in for EKG today as she felt better after Dr. Rayann Heman increased her Cardizem to bid and set her up for cardioversion last Friday for today. . She felt better and wanted her EKG checked today. It appears that she is now in a 2:1 atrial flutter, rate controlled.Previously in atrial fibrillation.  She will proceed to CV scheduled this am.

## 2020-11-26 NOTE — Discharge Instructions (Signed)

## 2020-11-26 NOTE — Anesthesia Procedure Notes (Signed)
Procedure Name: General with mask airway Date/Time: 11/26/2020 10:35 AM Performed by: Kathryne Hitch, CRNA Pre-anesthesia Checklist: Patient identified, Emergency Drugs available, Suction available and Patient being monitored Patient Re-evaluated:Patient Re-evaluated prior to induction Oxygen Delivery Method: Ambu bag Preoxygenation: Pre-oxygenation with 100% oxygen Induction Type: IV induction Ventilation: Mask ventilation without difficulty Dental Injury: Teeth and Oropharynx as per pre-operative assessment

## 2020-11-26 NOTE — CV Procedure (Signed)
CARDIOVERSION  Patient anesthetized by anesthesia with Propofol intravenously   With pads in Apex/Base position, patient cardioverted to SR with 200 J synchronized biphasic energy.  12 lead EKG pending   Procedure was without complication.  [name] MD

## 2020-11-26 NOTE — Anesthesia Preprocedure Evaluation (Signed)
Anesthesia Evaluation  Patient identified by MRN, date of birth, ID band Patient awake    Reviewed: Allergy & Precautions, NPO status , Patient's Chart, lab work & pertinent test results  History of Anesthesia Complications Negative for: history of anesthetic complications  Airway Mallampati: II  TM Distance: >3 FB Neck ROM: Full    Dental  (+) Dental Advisory Given   Pulmonary shortness of breath, sleep apnea and Continuous Positive Airway Pressure Ventilation , COPD,  COPD inhaler,    breath sounds clear to auscultation       Cardiovascular hypertension, Pt. on medications (-) angina+ dysrhythmias Atrial Fibrillation  Rhythm:Regular Rate:Normal  01/2020 Stress: There was no ST segment deviation noted during stress. This is a low risk study. The left ventricular ejection fraction is normal (55-65%).  Afib-flutter, in flutter presently   Neuro/Psych negative neurological ROS     GI/Hepatic Neg liver ROS, GERD  Medicated and Controlled,  Endo/Other  obese  Renal/GU negative Renal ROS     Musculoskeletal   Abdominal (+) + obese,   Peds  Hematology xarelto   Anesthesia Other Findings   Reproductive/Obstetrics                             Anesthesia Physical Anesthesia Plan  ASA: 3  Anesthesia Plan: General   Post-op Pain Management:    Induction: Intravenous  PONV Risk Score and Plan: 3 and Treatment may vary due to age or medical condition  Airway Management Planned: Natural Airway and Mask  Additional Equipment: None  Intra-op Plan:   Post-operative Plan:   Informed Consent: I have reviewed the patients History and Physical, chart, labs and discussed the procedure including the risks, benefits and alternatives for the proposed anesthesia with the patient or authorized representative who has indicated his/her understanding and acceptance.     Dental advisory given  Plan  Discussed with: CRNA and Surgeon  Anesthesia Plan Comments:         Anesthesia Quick Evaluation

## 2020-11-26 NOTE — Transfer of Care (Signed)
Immediate Anesthesia Transfer of Care Note  Patient: [name] [name]  Procedure(s) Performed: CARDIOVERSION  Patient Location: Endoscopy Unit  Anesthesia Type:General  Level of Consciousness: drowsy and patient cooperative  Airway & Oxygen Therapy: Patient Spontanous Breathing  Post-op Assessment: Report given to RN and Post -op Vital signs reviewed and stable  Post vital signs: Reviewed and stable  Last Vitals:  Vitals Value Taken Time  BP 121/69 11/26/20 1025  Temp    Pulse 72 11/26/20 1025  Resp 18 11/26/20 1025  SpO2 98 % 11/26/20 1025  Vitals shown include unvalidated device data.  Last Pain:  Vitals:   11/26/20 0930  TempSrc: Temporal  PainSc: 0-No pain         Complications: No notable events documented.

## 2020-11-26 NOTE — Anesthesia Postprocedure Evaluation (Signed)
Anesthesia Post Note  Patient: [name] [name]  Procedure(s) Performed: CARDIOVERSION     Patient location during evaluation: Endoscopy Anesthesia Type: General Level of consciousness: awake and alert, patient cooperative and oriented Pain management: pain level controlled Vital Signs Assessment: post-procedure vital signs reviewed and stable Respiratory status: spontaneous breathing, nonlabored ventilation and respiratory function stable Cardiovascular status: blood pressure returned to baseline and stable Postop Assessment: no apparent nausea or vomiting Anesthetic complications: no   No notable events documented.  Last Vitals:  Vitals:   11/26/20 1040 11/26/20 1050  BP: (!) 121/57 129/60  Pulse: (!) 51 72  Resp: (!) 24 15  Temp: 36.5 C   SpO2: 95% 98%    Last Pain:  Vitals:   11/26/20 1050  TempSrc:   PainSc: 0-No pain                 Abriella Filkins,E. Nachelle Negrette

## 2020-11-27 DIAGNOSIS — I10 Essential (primary) hypertension: Secondary | ICD-10-CM | POA: Diagnosis not present

## 2020-11-28 ENCOUNTER — Encounter (HOSPITAL_COMMUNITY): Payer: Self-pay | Admitting: Internal Medicine

## 2020-11-29 ENCOUNTER — Encounter: Payer: Self-pay | Admitting: Internal Medicine

## 2020-11-29 ENCOUNTER — Ambulatory Visit (INDEPENDENT_AMBULATORY_CARE_PROVIDER_SITE_OTHER): Payer: Medicare Other | Admitting: Internal Medicine

## 2020-11-29 ENCOUNTER — Other Ambulatory Visit: Payer: Self-pay

## 2020-11-29 VITALS — BP 148/80 | HR 99 | Ht 62.5 in | Wt 190.0 lb

## 2020-11-29 DIAGNOSIS — I4892 Unspecified atrial flutter: Secondary | ICD-10-CM | POA: Diagnosis not present

## 2020-11-29 DIAGNOSIS — I10 Essential (primary) hypertension: Secondary | ICD-10-CM

## 2020-11-29 DIAGNOSIS — D6869 Other thrombophilia: Secondary | ICD-10-CM

## 2020-11-29 DIAGNOSIS — I4819 Other persistent atrial fibrillation: Secondary | ICD-10-CM

## 2020-11-29 MED ORDER — DILTIAZEM HCL ER COATED BEADS 120 MG PO CP24: 120.0000 mg | ORAL_CAPSULE | Freq: Every evening | ORAL | Status: DC

## 2020-11-29 MED ORDER — DILTIAZEM HCL ER COATED BEADS 240 MG PO CP24: 240.0000 mg | ORAL_CAPSULE | Freq: Every morning | ORAL | 6 refills | Status: DC

## 2020-11-29 NOTE — Patient Instructions (Addendum)
Medication Instructions:  Increase your Diltiazem CD to '240mg'$  every morning & continue the evening dose as is at '120mg'$ . Continue all other medications.     Labwork: none  Testing/Procedures: none  Follow-Up: Already scheduled in [place]   Any Other Special Instructions Will Be Listed Below (If Applicable).   If you need a refill on your cardiac medications before your next appointment, please call your pharmacy.

## 2020-11-29 NOTE — Progress Notes (Signed)
PCP: Monico Blitz, MD Primary Cardiologist: Dr Sherri Rad is a 80 y.o. female who presents today for routine electrophysiology followup.  Since his recent afib ablation, the patient reports doing reasoanbly well.  she denies procedure related complications.  She has had ERAF/ persistent atrial arrhythmias.  SOB is better today since improved rate control.  Today, she denies symptoms of palpitations, chest pain, lower extremity edema, dizziness, presyncope, or syncope.  The patient is otherwise without complaint today.   Past Medical History:  Diagnosis Date   Allergic rhinitis    Anxiety    Asthma    Atrial flutter (HCC)    COPD (chronic obstructive pulmonary disease) ([place])    Depression    Essential hypertension    Hyperlipidemia    Lumbar disc disease    Obesity    OSA (obstructive sleep apnea)    CPAP   Paroxysmal atrial fibrillation (HCC)    Element of tachycardia bradycardia syndrome   Respiratory failure ([place])    Past Surgical History:  Procedure Laterality Date   ANTERIOR FUSION LUMBAR SPINE     ATRIAL FIBRILLATION ABLATION N/A 09/12/2020   Procedure: ATRIAL FIBRILLATION ABLATION;  Surgeon: Thompson Grayer, MD;  Location: [place] [facility];  Service: Cardiovascular;  Laterality: N/A;   BIOPSY  08/27/2016   Procedure: BIOPSY;  Surgeon: Rogene Houston, MD;  [facility];  Service: Endoscopy;;  FOUR GASTRIC POLYPS BIOPSIED   CARDIOVERSION N/A 03/28/2014   Procedure: CARDIOVERSION;  Surgeon: Fay Records, MD;  Location: [facility];  Service: Cardiovascular;  Laterality: N/A;   CARDIOVERSION N/A 10/22/2016   Procedure: CARDIOVERSION;  Surgeon: Sueanne Margarita, MD;  Location: [facility] ENDOSCOPY;  Service: Cardiovascular;  Laterality: N/A;   CARDIOVERSION N/A 11/26/2020   Procedure: CARDIOVERSION;  Surgeon: Fay Records, MD;  Location: [place];  Service: Cardiovascular;  Laterality: N/A;   CATARACT EXTRACTION     COLONOSCOPY N/A 08/04/2012    Procedure: COLONOSCOPY;  Surgeon: Rogene Houston, MD;  [facility];  Service: Endoscopy;  Laterality: N/A;  730-rescheduled to [place] notified pt   ELECTROPHYSIOLOGIC STUDY N/A 09/18/2014   Procedure: Atrial Fibrillation Ablation;  Surgeon: Thompson Grayer, MD;  Location: [place] [facility];  Service: Cardiovascular;  Laterality: N/A;   ESOPHAGOGASTRODUODENOSCOPY N/A 08/27/2016   Procedure: ESOPHAGOGASTRODUODENOSCOPY (EGD);  Surgeon: Rogene Houston, MD;  [facility];  Service: Endoscopy;  Laterality: N/A;  1200   implantable loop recorder placement  11/22/2020   Medtronic Reveal Linq model LNQ 11 (SN [id number]) implantable loop recorder   LASIK     Both eyes   TEE WITHOUT CARDIOVERSION N/A 09/18/2014   Procedure: TRANSESOPHAGEAL ECHOCARDIOGRAM (TEE);  Surgeon: Larey Dresser, MD;  Location: [place];  Service: Cardiovascular;  Laterality: N/A;   TEE WITHOUT CARDIOVERSION N/A 10/22/2016   Procedure: TRANSESOPHAGEAL ECHOCARDIOGRAM (TEE);  Surgeon: Sueanne Margarita, MD;  Location: [facility] ENDOSCOPY;  Service: Cardiovascular;  Laterality: N/A;   TEE WITHOUT CARDIOVERSION N/A 08/24/2017   Procedure: TRANSESOPHAGEAL ECHOCARDIOGRAM (TEE);  Surgeon: Josue Hector, MD;  Location: [place];  Service: Cardiovascular;  Laterality: N/A;   TOTAL HIP ARTHROPLASTY  03/30/2008   Right    ROS- all systems are personally reviewed and negatives except as per HPI above  Current Outpatient Medications  Medication Sig Dispense Refill   acetaminophen (TYLENOL) 500 MG tablet Take 500 mg by mouth every 8 (eight) hours as needed for headache.     antiseptic oral  rinse (BIOTENE) LIQD 15 mLs by Mouth Rinse route daily as needed for dry mouth.     atorvastatin (LIPITOR) 10 MG tablet Take 10 mg by mouth daily at 6 PM.     benzonatate (TESSALON) 200 MG capsule Take 1 capsule (200 mg total) by mouth 3 (three) times daily as needed for cough. 30 capsule 3   Biotin 1 MG CAPS Take 1,000 mg by  mouth 3 (three) times a week.      budesonide (PULMICORT) 0.5 MG/2ML nebulizer solution Take 0.5 mg by nebulization daily.      Calcium Carbonate-Vit D-Min (QC CALCIUM-MAGNESIUM-ZINC-D3) 333.4-133 MG-UNIT TABS Take 1 tablet by mouth 2 (two) times daily.      cetirizine (ZYRTEC) 10 MG tablet Take 10 mg by mouth at bedtime as needed for allergies.     clindamycin (CLEOCIN) 300 MG capsule Take 600 mg by mouth See admin instructions. Take prior to dental procedures  0   diltiazem (CARDIZEM CD) 240 MG 24 hr capsule Take 1 capsule (240 mg total) by mouth every morning. 30 capsule 6   diltiazem (CARDIZEM) 30 MG tablet TAKE 1 TABLET BY MOUTH AS NEEDED FOR PALPITATIONS 45 tablet 2   dofetilide (TIKOSYN) 500 MCG capsule Take 1 capsule (500 mcg total) by mouth 2 (two) times daily. 180 capsule 1   EPINEPHrine 0.3 mg/0.3 mL IJ SOAJ injection Inject 0.3 mg into the muscle as needed for anaphylaxis.     famotidine (PEPCID) 20 MG tablet Take 2 tablets (40 mg total) by mouth at bedtime as needed. 30 tablet 1   fluticasone (FLONASE) 50 MCG/ACT nasal spray Place 1 spray into both nostrils 2 (two) times daily.     hydrALAZINE (APRESOLINE) 100 MG tablet Take 100 mg by mouth 2 (two) times daily.     HYDROcodone bit-homatropine (HYCODAN) 5-1.5 MG/5ML syrup Take 5 mLs by mouth every 6 (six) hours as needed for cough.     ipratropium-albuterol (DUONEB) 0.5-2.5 (3) MG/3ML SOLN Take 3 mLs by nebulization 4 (four) times daily as needed (Shortness of breathing).     ketoconazole (NIZORAL) 2 % shampoo Apply 1 application topically 3 (three) times a week.     losartan (COZAAR) 100 MG tablet Take 100 mg by mouth daily.     Magnesium 250 MG TABS Take 250 mg by mouth daily.     montelukast (SINGULAIR) 10 MG tablet Take 10 mg by mouth at bedtime.     pantoprazole (PROTONIX) 40 MG tablet Take 1 tablet (40 mg total) by mouth in the morning. Take 40 mg by mouth in the morning. 90 tablet 3   Polyethyl Glycol-Propyl Glycol (GENTEAL  TEARS SEVERE DAY/NIGHT) 0.4-0.3 % GEL ophthalmic gel Place 1 application into both eyes in the morning and at bedtime.     potassium chloride SA (KLOR-CON) 20 MEQ tablet Take 1 tablet (20 mEq total) by mouth daily. 90 tablet 3   Rivaroxaban (XARELTO) 15 MG TABS tablet Take 1 tablet (15 mg total) by mouth daily with supper. 90 tablet 3   Sodium Chloride-Sodium Bicarb (AYR SALINE NASAL NETI RINSE) 1.57 g PACK Place 1 each into the nose daily as needed (rinse nasal often). Neil sinus rinse     umeclidinium-vilanterol (ANORO ELLIPTA) 62.5-25 MCG/INH AEPB INHALE ONE PUFF INTO THE LUNGS DAILY 60 each 11   Wheat Dextrin (BENEFIBER ON THE GO) POWD Take 10 mLs by mouth daily.     Budeson-Glycopyrrol-Formoterol (BREZTRI AEROSPHERE) 160-9-4.8 MCG/ACT AERO Inhale 2 puffs into the lungs 2 (two) times  daily. (Patient not taking: Reported on 11/29/2020) 10.7 g 0   diltiazem (CARDIZEM CD) 120 MG 24 hr capsule Take 1 capsule (120 mg total) by mouth every evening.     Current Facility-Administered Medications  Medication Dose Route Frequency Provider Last Rate Last Admin   Mepolizumab SOLR 100 mg  100 mg Subcutaneous Q28 days Baird Lyons D, MD   100 mg at 09/07/19 0947   Mepolizumab SOLR 100 mg  100 mg Subcutaneous Q28 days Baird Lyons D, MD   100 mg at 11/02/19 1123    Physical Exam: Vitals:   11/29/20 1034  BP: (!) 148/80  Pulse: 99  SpO2: 97%  Weight: 190 lb (86.2 kg)  Height: 5' 2.5" (1.588 m)    GEN- The patient is well appearing, alert and oriented x 3 today.   Head- normocephalic, atraumatic Eyes-  Sclera clear, conjunctiva pink Ears- hearing intact Oropharynx- clear Lungs- Clear to ausculation bilaterally, normal work of breathing Heart- iRRR GI- soft, NT, ND, + BS Extremities- no clubbing, cyanosis, or edema  EKG tracing ordered today is personally reviewed and shows atypical atrial flutter  Assessment and Plan:  1. Persistent atrial fibrillation She continues to have ERAF/  atypical atrial flutter post ablation Continue anticoagulation Increase diltiazem to '240mg'$  qam and '120mg'$  qpm Return to see me in 4 weeks.  We will consider [facility] if still  in AF at that time  2. Obesity Body mass index is 34.2 kg/m. Lifestyle modification is advised  3. HTN Increase diltiazem as above    Thompson Grayer MD, [facility] 11/29/2020 11:08 AM

## 2020-12-05 ENCOUNTER — Ambulatory Visit (HOSPITAL_COMMUNITY): Payer: Medicare Other | Admitting: Nurse Practitioner

## 2020-12-06 DIAGNOSIS — M546 Pain in thoracic spine: Secondary | ICD-10-CM | POA: Diagnosis not present

## 2020-12-06 DIAGNOSIS — M9905 Segmental and somatic dysfunction of pelvic region: Secondary | ICD-10-CM | POA: Diagnosis not present

## 2020-12-06 DIAGNOSIS — M542 Cervicalgia: Secondary | ICD-10-CM | POA: Diagnosis not present

## 2020-12-06 DIAGNOSIS — M9901 Segmental and somatic dysfunction of cervical region: Secondary | ICD-10-CM | POA: Diagnosis not present

## 2020-12-06 DIAGNOSIS — M9903 Segmental and somatic dysfunction of lumbar region: Secondary | ICD-10-CM | POA: Diagnosis not present

## 2020-12-06 DIAGNOSIS — M48061 Spinal stenosis, lumbar region without neurogenic claudication: Secondary | ICD-10-CM | POA: Diagnosis not present

## 2020-12-06 DIAGNOSIS — M9902 Segmental and somatic dysfunction of thoracic region: Secondary | ICD-10-CM | POA: Diagnosis not present

## 2020-12-08 ENCOUNTER — Other Ambulatory Visit (HOSPITAL_COMMUNITY): Payer: Self-pay | Admitting: Nurse Practitioner

## 2020-12-18 ENCOUNTER — Ambulatory Visit (INDEPENDENT_AMBULATORY_CARE_PROVIDER_SITE_OTHER): Payer: Medicare Other

## 2020-12-18 ENCOUNTER — Other Ambulatory Visit: Payer: Self-pay

## 2020-12-18 VITALS — BP 123/61 | HR 61 | Temp 98.3°F | Resp 18 | Wt 192.0 lb

## 2020-12-18 DIAGNOSIS — J4520 Mild intermittent asthma, uncomplicated: Secondary | ICD-10-CM

## 2020-12-18 MED ORDER — MEPOLIZUMAB 100 MG ~~LOC~~ SOLR
100.0000 mg | Freq: Once | SUBCUTANEOUS | Status: AC
  Administered 2020-12-18: 100 mg via SUBCUTANEOUS
  Filled 2020-12-18: qty 1

## 2020-12-18 NOTE — Progress Notes (Signed)
Diagnosis: Asthma  Provider:  Praveen Mannam, MD  Procedure: Injection  Nucala (Mepolizumab), Dose: 100 mg, Site: subcutaneous  Discharge: Condition: Good, Destination: Home . AVS provided to patient.   Performed by:  Lexianna Weinrich N Kashon Kraynak, RN        

## 2020-12-25 ENCOUNTER — Ambulatory Visit (INDEPENDENT_AMBULATORY_CARE_PROVIDER_SITE_OTHER): Payer: Medicare Other

## 2020-12-25 DIAGNOSIS — I495 Sick sinus syndrome: Secondary | ICD-10-CM

## 2020-12-26 LAB — CUP PACEART REMOTE DEVICE CHECK
Date Time Interrogation Session: 20220928103356
Implantable Pulse Generator Implant Date: 20220826

## 2020-12-27 DIAGNOSIS — I10 Essential (primary) hypertension: Secondary | ICD-10-CM | POA: Diagnosis not present

## 2020-12-30 DIAGNOSIS — M9905 Segmental and somatic dysfunction of pelvic region: Secondary | ICD-10-CM | POA: Diagnosis not present

## 2020-12-30 DIAGNOSIS — M9901 Segmental and somatic dysfunction of cervical region: Secondary | ICD-10-CM | POA: Diagnosis not present

## 2020-12-30 DIAGNOSIS — M9902 Segmental and somatic dysfunction of thoracic region: Secondary | ICD-10-CM | POA: Diagnosis not present

## 2020-12-30 DIAGNOSIS — M546 Pain in thoracic spine: Secondary | ICD-10-CM | POA: Diagnosis not present

## 2020-12-30 DIAGNOSIS — M79672 Pain in left foot: Secondary | ICD-10-CM | POA: Diagnosis not present

## 2020-12-30 DIAGNOSIS — M9903 Segmental and somatic dysfunction of lumbar region: Secondary | ICD-10-CM | POA: Diagnosis not present

## 2020-12-30 DIAGNOSIS — M542 Cervicalgia: Secondary | ICD-10-CM | POA: Diagnosis not present

## 2020-12-30 DIAGNOSIS — M48061 Spinal stenosis, lumbar region without neurogenic claudication: Secondary | ICD-10-CM | POA: Diagnosis not present

## 2020-12-30 DIAGNOSIS — M79671 Pain in right foot: Secondary | ICD-10-CM | POA: Diagnosis not present

## 2021-01-01 ENCOUNTER — Ambulatory Visit (INDEPENDENT_AMBULATORY_CARE_PROVIDER_SITE_OTHER): Payer: Medicare Other | Admitting: Podiatry

## 2021-01-01 ENCOUNTER — Other Ambulatory Visit: Payer: Self-pay

## 2021-01-01 ENCOUNTER — Encounter: Payer: Self-pay | Admitting: Podiatry

## 2021-01-01 DIAGNOSIS — M79674 Pain in right toe(s): Secondary | ICD-10-CM

## 2021-01-01 DIAGNOSIS — B351 Tinea unguium: Secondary | ICD-10-CM | POA: Diagnosis not present

## 2021-01-01 DIAGNOSIS — M79675 Pain in left toe(s): Secondary | ICD-10-CM | POA: Diagnosis not present

## 2021-01-01 NOTE — Progress Notes (Signed)
Carelink Summary Report / Loop Recorder 

## 2021-01-02 DIAGNOSIS — U071 COVID-19: Secondary | ICD-10-CM | POA: Diagnosis not present

## 2021-01-02 DIAGNOSIS — Z23 Encounter for immunization: Secondary | ICD-10-CM | POA: Diagnosis not present

## 2021-01-03 ENCOUNTER — Encounter: Payer: Self-pay | Admitting: Internal Medicine

## 2021-01-03 ENCOUNTER — Ambulatory Visit (INDEPENDENT_AMBULATORY_CARE_PROVIDER_SITE_OTHER): Payer: Medicare Other | Admitting: Internal Medicine

## 2021-01-03 ENCOUNTER — Ambulatory Visit: Payer: Medicare Other | Admitting: Internal Medicine

## 2021-01-03 VITALS — BP 134/66 | HR 72 | Ht 62.5 in | Wt 193.6 lb

## 2021-01-03 DIAGNOSIS — I4819 Other persistent atrial fibrillation: Secondary | ICD-10-CM

## 2021-01-03 DIAGNOSIS — I10 Essential (primary) hypertension: Secondary | ICD-10-CM

## 2021-01-03 NOTE — Progress Notes (Signed)
PCP: Monico Blitz, MD Primary Cardiologist: Dr Domenic Polite Primary EP: Dr Rayann Heman  [name] [name] is a 80 y.o. female who presents today for routine electrophysiology followup.  Since last being seen in our clinic, the patient reports doing reasonably well.  She continues to have some palpitations.  She is not very active due to issues with her feet.  + SOB with activity chronically.  Today, she denies symptoms of palpitations, chest pain,  lower extremity edema, dizziness, presyncope, or syncope.  The patient is otherwise without complaint today.   Past Medical History:  Diagnosis Date   Allergic rhinitis    Anxiety    Asthma    Atrial flutter (HCC)    COPD (chronic obstructive pulmonary disease) ([place])    Depression    Essential hypertension    Hyperlipidemia    Lumbar disc disease    Obesity    OSA (obstructive sleep apnea)    CPAP   Paroxysmal atrial fibrillation (HCC)    Element of tachycardia bradycardia syndrome   Respiratory failure ([place])    Past Surgical History:  Procedure Laterality Date   ANTERIOR FUSION LUMBAR SPINE     ATRIAL FIBRILLATION ABLATION N/A 09/12/2020   Procedure: ATRIAL FIBRILLATION ABLATION;  Surgeon: Thompson Grayer, MD;  Location: [place] [facility];  Service: Cardiovascular;  Laterality: N/A;   BIOPSY  08/27/2016   Procedure: BIOPSY;  Surgeon: Rogene Houston, MD;  [facility];  Service: Endoscopy;;  FOUR GASTRIC POLYPS BIOPSIED   CARDIOVERSION N/A 03/28/2014   Procedure: CARDIOVERSION;  Surgeon: Fay Records, MD;  Location: [facility];  Service: Cardiovascular;  Laterality: N/A;   CARDIOVERSION N/A 10/22/2016   Procedure: CARDIOVERSION;  Surgeon: Sueanne Margarita, MD;  Location: [facility] ENDOSCOPY;  Service: Cardiovascular;  Laterality: N/A;   CARDIOVERSION N/A 11/26/2020   Procedure: CARDIOVERSION;  Surgeon: Fay Records, MD;  Location: [place];  Service: Cardiovascular;  Laterality: N/A;   CATARACT EXTRACTION     COLONOSCOPY N/A  08/04/2012   Procedure: COLONOSCOPY;  Surgeon: Rogene Houston, MD;  [facility];  Service: Endoscopy;  Laterality: N/A;  730-rescheduled to [place] notified pt   ELECTROPHYSIOLOGIC STUDY N/A 09/18/2014   Procedure: Atrial Fibrillation Ablation;  Surgeon: Thompson Grayer, MD;  Location: [place] [facility];  Service: Cardiovascular;  Laterality: N/A;   ESOPHAGOGASTRODUODENOSCOPY N/A 08/27/2016   Procedure: ESOPHAGOGASTRODUODENOSCOPY (EGD);  Surgeon: Rogene Houston, MD;  [facility];  Service: Endoscopy;  Laterality: N/A;  1200   implantable loop recorder placement  11/22/2020   Medtronic Reveal Linq model LNQ 11 (SN [id number]) implantable loop recorder   LASIK     Both eyes   TEE WITHOUT CARDIOVERSION N/A 09/18/2014   Procedure: TRANSESOPHAGEAL ECHOCARDIOGRAM (TEE);  Surgeon: Larey Dresser, MD;  Location: [place];  Service: Cardiovascular;  Laterality: N/A;   TEE WITHOUT CARDIOVERSION N/A 10/22/2016   Procedure: TRANSESOPHAGEAL ECHOCARDIOGRAM (TEE);  Surgeon: Sueanne Margarita, MD;  Location: [facility] ENDOSCOPY;  Service: Cardiovascular;  Laterality: N/A;   TEE WITHOUT CARDIOVERSION N/A 08/24/2017   Procedure: TRANSESOPHAGEAL ECHOCARDIOGRAM (TEE);  Surgeon: Josue Hector, MD;  Location: [place];  Service: Cardiovascular;  Laterality: N/A;   TOTAL HIP ARTHROPLASTY  03/30/2008   Right    ROS- all systems are reviewed and negatives except as per HPI above  Current Outpatient Medications  Medication Sig Dispense Refill   acetaminophen (TYLENOL) 500 MG tablet Take 500 mg by mouth every 8 (eight) hours as needed  for headache.     antiseptic oral rinse (BIOTENE) LIQD 15 mLs by Mouth Rinse route daily as needed for dry mouth.     atorvastatin (LIPITOR) 10 MG tablet Take 10 mg by mouth daily at 6 PM.     benzonatate (TESSALON) 200 MG capsule Take 1 capsule (200 mg total) by mouth 3 (three) times daily as needed for cough. 30 capsule 3   Biotin 1 MG CAPS Take 1,000 mg  by mouth 3 (three) times a week.      budesonide (PULMICORT) 0.5 MG/2ML nebulizer solution Take 0.5 mg by nebulization daily.      Calcium Carbonate-Vit D-Min (QC CALCIUM-MAGNESIUM-ZINC-D3) 333.4-133 MG-UNIT TABS Take 1 tablet by mouth 2 (two) times daily.      cetirizine (ZYRTEC) 10 MG tablet Take 10 mg by mouth at bedtime as needed for allergies.     clindamycin (CLEOCIN) 300 MG capsule Take 600 mg by mouth See admin instructions. Take prior to dental procedures  0   diltiazem (CARDIZEM CD) 120 MG 24 hr capsule Take 1 capsule (120 mg total) by mouth every evening.     diltiazem (CARDIZEM CD) 240 MG 24 hr capsule Take 1 capsule (240 mg total) by mouth every morning. 30 capsule 6   diltiazem (CARDIZEM) 30 MG tablet TAKE 1 TABLET BY MOUTH AS NEEDED FOR PALPITATIONS 45 tablet 2   dofetilide (TIKOSYN) 500 MCG capsule Take 1 capsule (500 mcg total) by mouth 2 (two) times daily. 180 capsule 1   EPINEPHrine 0.3 mg/0.3 mL IJ SOAJ injection Inject 0.3 mg into the muscle as needed for anaphylaxis.     famotidine (PEPCID) 20 MG tablet Take 2 tablets (40 mg total) by mouth at bedtime as needed. 30 tablet 1   fluticasone (FLONASE) 50 MCG/ACT nasal spray Place 1 spray into both nostrils 2 (two) times daily.     hydrALAZINE (APRESOLINE) 100 MG tablet Take 100 mg by mouth 2 (two) times daily.     HYDROcodone bit-homatropine (HYCODAN) 5-1.5 MG/5ML syrup Take 5 mLs by mouth every 6 (six) hours as needed for cough.     ipratropium-albuterol (DUONEB) 0.5-2.5 (3) MG/3ML SOLN Take 3 mLs by nebulization 4 (four) times daily as needed (Shortness of breathing).     ketoconazole (NIZORAL) 2 % shampoo Apply 1 application topically 3 (three) times a week.     losartan (COZAAR) 100 MG tablet TAKE 1 TABLET ONCE DAILY. 90 tablet 1   Magnesium 250 MG TABS Take 250 mg by mouth daily.     montelukast (SINGULAIR) 10 MG tablet Take 10 mg by mouth at bedtime.     pantoprazole (PROTONIX) 40 MG tablet Take 1 tablet (40 mg total) by  mouth in the morning. Take 40 mg by mouth in the morning. 90 tablet 3   Polyethyl Glycol-Propyl Glycol (GENTEAL TEARS SEVERE DAY/NIGHT) 0.4-0.3 % GEL ophthalmic gel Place 1 application into both eyes in the morning and at bedtime.     potassium chloride SA (KLOR-CON) 20 MEQ tablet Take 1 tablet (20 mEq total) by mouth daily. 90 tablet 3   Rivaroxaban (XARELTO) 15 MG TABS tablet Take 1 tablet (15 mg total) by mouth daily with supper. 90 tablet 3   Sodium Chloride-Sodium Bicarb (AYR SALINE NASAL NETI RINSE) 1.57 g PACK Place 1 each into the nose daily as needed (rinse nasal often). Neil sinus rinse     umeclidinium-vilanterol (ANORO ELLIPTA) 62.5-25 MCG/INH AEPB INHALE ONE PUFF INTO THE LUNGS DAILY 60 each 11   Wheat Dextrin (  BENEFIBER ON THE GO) POWD Take 10 mLs by mouth daily.     Budeson-Glycopyrrol-Formoterol (BREZTRI AEROSPHERE) 160-9-4.8 MCG/ACT AERO Inhale 2 puffs into the lungs 2 (two) times daily. (Patient not taking: Reported on 01/03/2021) 10.7 g 0   Current Facility-Administered Medications  Medication Dose Route Frequency Provider Last Rate Last Admin   Mepolizumab SOLR 100 mg  100 mg Subcutaneous Q28 days Baird Lyons D, MD   100 mg at 09/07/19 0947   Mepolizumab SOLR 100 mg  100 mg Subcutaneous Q28 days Baird Lyons D, MD   100 mg at 11/02/19 1123    Physical Exam: Vitals:   01/03/21 0934  BP: 134/66  Pulse: 72  SpO2: 98%  Weight: 193 lb 9.6 oz (87.8 kg)  Height: 5' 2.5" (1.588 m)    GEN- The patient is well appearing, alert and oriented x 3 today.   Head- normocephalic, atraumatic Eyes-  Sclera clear, conjunctiva pink Ears- hearing intact Oropharynx- clear Lungs- Clear to ausculation bilaterally, normal work of breathing Heart- Regular rate and rhythm, no murmurs, rubs or gallops, PMI not laterally displaced GI- soft, NT, ND, + BS Extremities- no clubbing, cyanosis, or edema  Wt Readings from Last 3 Encounters:  01/03/21 193 lb 9.6 oz (87.8 kg)  12/18/20 192 lb  (87.1 kg)  11/29/20 190 lb (86.2 kg)     Assessment and Plan:  Persistent afib She had some ERAF but overall much improved post ablation (AF burden is 2.7%).  continue tikosyn No changes today Labs august and July reviewed  2. Obesity Body mass index is 34.85 kg/m. Lifestyle modification advised  3. HTN Continue twice daily diltiazem  4. HL Continue lipitor 10mg  daily   Return in 3 months to AF clinic   Thompson Grayer MD, [facility] 01/03/2021 9:38 AM

## 2021-01-03 NOTE — Patient Instructions (Signed)
Medication Instructions:  Continue all current medications.  Labwork: none  Testing/Procedures: none  Follow-Up: 3 months - Afib Clinic  Any Other Special Instructions Will Be Listed Below (If Applicable).   If you need a refill on your cardiac medications before your next appointment, please call your pharmacy.

## 2021-01-06 NOTE — Progress Notes (Signed)
  Subjective:  Patient ID: [name] [name], female    DOB: 1940-07-20,  MRN: [id number]  [name] [name] presents to clinic today for thick, elongated toenails b/l feet which are tender when wearing enclosed shoe gear.  She states one of her orthotics has developed a hole in it.   PCP is Monico Blitz, MD , and last visit was 08/19/2020.  Allergies  Allergen Reactions   Bupropion Hcl Other (See Comments)    Suicidal Thoughts.    Escitalopram Oxalate Other (See Comments)    Suicidal Thoughts.    Fluticasone-Salmeterol Other (See Comments)    Advair - Caused patient to go into Afib.    Lisinopril Cough   Serevent Other (See Comments)    Caused patient to go into Afib    Review of Systems: Negative except as noted in the HPI. Objective:   Constitutional [name] [name] is a pleasant 80 y.o. Caucasian female, morbidly obese in NAD. AAO x 3.   Vascular Capillary refill time to digits immediate b/l. Palpable DP pulse(s) b/l lower extremities Palpable PT pulse(s) b/l lower extremities Pedal hair sparse. Lower extremity skin temperature gradient within normal limits. No pain with calf compression b/l. Trace edema noted b/l lower extremities. No cyanosis or clubbing noted.  Neurologic Normal speech. Oriented to person, place, and time. Protective sensation intact 5/5 intact bilaterally with 10g monofilament b/l.  Dermatologic Skin warm and supple b/l lower extremities. No open wounds b/l lower extremities. No interdigital macerations b/l lower extremities. Toenails 1-5 b/l elongated, discolored, dystrophic, thickened, crumbly with subungual debris and tenderness to dorsal palpation.  Orthopedic: Normal muscle strength 5/5 to all lower extremity muscle groups bilaterally. Hammertoe(s) noted to the b/l lower extremities. 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   Radiographs: None Assessment:   1. Pain due to onychomycosis of toenails  of both feet    Plan:  Patient was evaluated and treated and all questions answered. Consent given for treatment as described below: -Examined patient. -Patient is to schedule appt with Pedorthist for refurbishing of orthotic. -Patient to continue soft, supportive shoe gear daily. -Toenails 1-5 b/l were debrided in length and girth with sterile nail nippers and dremel without iatrogenic bleeding.  -Patient to report any pedal injuries to medical professional immediately. -Patient/POA to call should there be question/concern in the interim.  Return in about 3 months (around 04/03/2021).  Marzetta Board, DPM

## 2021-01-08 ENCOUNTER — Encounter: Payer: Medicare Other | Admitting: Internal Medicine

## 2021-01-15 ENCOUNTER — Ambulatory Visit (INDEPENDENT_AMBULATORY_CARE_PROVIDER_SITE_OTHER): Payer: Medicare Other

## 2021-01-15 ENCOUNTER — Other Ambulatory Visit: Payer: Self-pay

## 2021-01-15 VITALS — BP 133/56 | HR 61 | Temp 97.8°F | Resp 18 | Ht 62.5 in | Wt 192.4 lb

## 2021-01-15 DIAGNOSIS — I10 Essential (primary) hypertension: Secondary | ICD-10-CM | POA: Diagnosis not present

## 2021-01-15 DIAGNOSIS — J4520 Mild intermittent asthma, uncomplicated: Secondary | ICD-10-CM | POA: Diagnosis not present

## 2021-01-15 DIAGNOSIS — Z6835 Body mass index (BMI) 35.0-35.9, adult: Secondary | ICD-10-CM | POA: Diagnosis not present

## 2021-01-15 DIAGNOSIS — Z299 Encounter for prophylactic measures, unspecified: Secondary | ICD-10-CM | POA: Diagnosis not present

## 2021-01-15 DIAGNOSIS — Z23 Encounter for immunization: Secondary | ICD-10-CM | POA: Diagnosis not present

## 2021-01-15 DIAGNOSIS — I4891 Unspecified atrial fibrillation: Secondary | ICD-10-CM | POA: Diagnosis not present

## 2021-01-15 DIAGNOSIS — E7800 Pure hypercholesterolemia, unspecified: Secondary | ICD-10-CM | POA: Diagnosis not present

## 2021-01-15 MED ORDER — METHYLPREDNISOLONE SODIUM SUCC 125 MG IJ SOLR: 125.0000 mg | Freq: Once | INTRAMUSCULAR | Status: DC | PRN

## 2021-01-15 MED ORDER — MEPOLIZUMAB 100 MG ~~LOC~~ SOLR
100.0000 mg | Freq: Once | SUBCUTANEOUS | Status: AC
  Administered 2021-01-15: 100 mg via SUBCUTANEOUS
  Filled 2021-01-15: qty 1

## 2021-01-15 MED ORDER — FAMOTIDINE IN NACL 20-0.9 MG/50ML-% IV SOLN: 20.0000 mg | Freq: Once | INTRAVENOUS | Status: DC | PRN

## 2021-01-15 MED ORDER — DIPHENHYDRAMINE HCL 50 MG/ML IJ SOLN: 50.0000 mg | Freq: Once | INTRAMUSCULAR | Status: DC | PRN

## 2021-01-15 MED ORDER — SODIUM CHLORIDE 0.9 % IV SOLN: Freq: Once | INTRAVENOUS | Status: DC | PRN

## 2021-01-15 MED ORDER — ALBUTEROL SULFATE HFA 108 (90 BASE) MCG/ACT IN AERS: 2.0000 | INHALATION_SPRAY | Freq: Once | RESPIRATORY_TRACT | Status: DC | PRN

## 2021-01-15 MED ORDER — EPINEPHRINE 0.3 MG/0.3ML IJ SOAJ: 0.3000 mg | Freq: Once | INTRAMUSCULAR | Status: DC | PRN

## 2021-01-15 NOTE — Progress Notes (Signed)
Diagnosis: Asthma  Provider:  Praveen Mannam, MD  Procedure: Injection  Nucala (Mepolizumab), Dose: 100 mg, Site: subcutaneous  Discharge: Condition: Good, Destination: Home . AVS provided to patient.   Performed by:  Syrianna Schillaci N Jammie Clink, RN        

## 2021-01-17 DIAGNOSIS — Z6835 Body mass index (BMI) 35.0-35.9, adult: Secondary | ICD-10-CM | POA: Diagnosis not present

## 2021-01-17 DIAGNOSIS — Z299 Encounter for prophylactic measures, unspecified: Secondary | ICD-10-CM | POA: Diagnosis not present

## 2021-01-17 DIAGNOSIS — R350 Frequency of micturition: Secondary | ICD-10-CM | POA: Diagnosis not present

## 2021-01-17 DIAGNOSIS — Z713 Dietary counseling and surveillance: Secondary | ICD-10-CM | POA: Diagnosis not present

## 2021-01-17 DIAGNOSIS — N390 Urinary tract infection, site not specified: Secondary | ICD-10-CM | POA: Diagnosis not present

## 2021-01-20 DIAGNOSIS — L820 Inflamed seborrheic keratosis: Secondary | ICD-10-CM | POA: Diagnosis not present

## 2021-01-20 DIAGNOSIS — L298 Other pruritus: Secondary | ICD-10-CM | POA: Diagnosis not present

## 2021-01-20 DIAGNOSIS — D225 Melanocytic nevi of trunk: Secondary | ICD-10-CM | POA: Diagnosis not present

## 2021-01-20 DIAGNOSIS — Z789 Other specified health status: Secondary | ICD-10-CM | POA: Diagnosis not present

## 2021-01-20 DIAGNOSIS — L570 Actinic keratosis: Secondary | ICD-10-CM | POA: Diagnosis not present

## 2021-01-20 DIAGNOSIS — L814 Other melanin hyperpigmentation: Secondary | ICD-10-CM | POA: Diagnosis not present

## 2021-01-20 DIAGNOSIS — H04123 Dry eye syndrome of bilateral lacrimal glands: Secondary | ICD-10-CM | POA: Diagnosis not present

## 2021-01-20 DIAGNOSIS — Z961 Presence of intraocular lens: Secondary | ICD-10-CM | POA: Diagnosis not present

## 2021-01-20 DIAGNOSIS — D485 Neoplasm of uncertain behavior of skin: Secondary | ICD-10-CM | POA: Diagnosis not present

## 2021-01-20 DIAGNOSIS — C44622 Squamous cell carcinoma of skin of right upper limb, including shoulder: Secondary | ICD-10-CM | POA: Diagnosis not present

## 2021-01-20 DIAGNOSIS — H524 Presbyopia: Secondary | ICD-10-CM | POA: Diagnosis not present

## 2021-01-27 ENCOUNTER — Ambulatory Visit (INDEPENDENT_AMBULATORY_CARE_PROVIDER_SITE_OTHER): Payer: Medicare Other

## 2021-01-27 DIAGNOSIS — I495 Sick sinus syndrome: Secondary | ICD-10-CM

## 2021-01-27 DIAGNOSIS — I10 Essential (primary) hypertension: Secondary | ICD-10-CM | POA: Diagnosis not present

## 2021-01-27 LAB — CUP PACEART REMOTE DEVICE CHECK
Date Time Interrogation Session: 20221031065851
Implantable Pulse Generator Implant Date: 20220826

## 2021-01-30 ENCOUNTER — Telehealth: Payer: Self-pay | Admitting: Podiatry

## 2021-01-30 NOTE — Telephone Encounter (Signed)
Pt was on schedule for orthotic repair/refurbish and I called pt to discuss and we canceled her appt because she needs the orthotics to be refurbished and for that no appt is needed. Pt is aware ok to drop the inserts off and pay the 90.00 and I will send them out and when they come in I will call pt just come pick them up..She stated she has to be in [place] next week and will drop them of at that time.

## 2021-02-03 ENCOUNTER — Ambulatory Visit: Payer: Medicare Other | Admitting: Podiatry

## 2021-02-03 NOTE — Progress Notes (Signed)
Carelink Summary Report / Loop Recorder 

## 2021-02-05 DIAGNOSIS — B351 Tinea unguium: Secondary | ICD-10-CM

## 2021-02-12 ENCOUNTER — Ambulatory Visit (INDEPENDENT_AMBULATORY_CARE_PROVIDER_SITE_OTHER): Payer: Medicare Other | Admitting: *Deleted

## 2021-02-12 ENCOUNTER — Other Ambulatory Visit: Payer: Self-pay

## 2021-02-12 VITALS — BP 161/66 | HR 72 | Temp 97.6°F | Resp 16 | Ht 62.0 in | Wt 194.6 lb

## 2021-02-12 DIAGNOSIS — J4520 Mild intermittent asthma, uncomplicated: Secondary | ICD-10-CM | POA: Diagnosis not present

## 2021-02-12 MED ORDER — EPINEPHRINE 0.3 MG/0.3ML IJ SOAJ: 0.3000 mg | Freq: Once | INTRAMUSCULAR | Status: DC | PRN

## 2021-02-12 MED ORDER — METHYLPREDNISOLONE SODIUM SUCC 125 MG IJ SOLR: 125.0000 mg | Freq: Once | INTRAMUSCULAR | Status: DC | PRN

## 2021-02-12 MED ORDER — DIPHENHYDRAMINE HCL 50 MG/ML IJ SOLN: 50.0000 mg | Freq: Once | INTRAMUSCULAR | Status: DC | PRN

## 2021-02-12 MED ORDER — MEPOLIZUMAB 100 MG ~~LOC~~ SOLR
100.0000 mg | Freq: Once | SUBCUTANEOUS | Status: AC
  Administered 2021-02-12: 100 mg via SUBCUTANEOUS
  Filled 2021-02-12: qty 1

## 2021-02-12 MED ORDER — SODIUM CHLORIDE 0.9 % IV SOLN: Freq: Once | INTRAVENOUS | Status: DC | PRN

## 2021-02-12 MED ORDER — FAMOTIDINE IN NACL 20-0.9 MG/50ML-% IV SOLN: 20.0000 mg | Freq: Once | INTRAVENOUS | Status: DC | PRN

## 2021-02-12 MED ORDER — ALBUTEROL SULFATE HFA 108 (90 BASE) MCG/ACT IN AERS: 2.0000 | INHALATION_SPRAY | Freq: Once | RESPIRATORY_TRACT | Status: DC | PRN

## 2021-02-12 NOTE — Progress Notes (Signed)
Diagnosis: Asthma  Provider:  Praveen Mannam, MD  Procedure: Injection  Nucala (Mepolizumab), Dose: 100 mg, Site: subcutaneous  Discharge: Condition: Good, Destination: Home . AVS provided to patient.   Performed by:  Deetta Siegmann A, RN        

## 2021-02-24 ENCOUNTER — Telehealth: Payer: Self-pay | Admitting: Podiatry

## 2021-02-24 NOTE — Telephone Encounter (Signed)
Refurbished orthotics in.. pt aware ok to pick up. 

## 2021-02-26 DIAGNOSIS — I10 Essential (primary) hypertension: Secondary | ICD-10-CM | POA: Diagnosis not present

## 2021-02-27 DIAGNOSIS — Z20828 Contact with and (suspected) exposure to other viral communicable diseases: Secondary | ICD-10-CM | POA: Diagnosis not present

## 2021-03-03 ENCOUNTER — Ambulatory Visit: Payer: Medicare Other | Admitting: Podiatry

## 2021-03-03 ENCOUNTER — Ambulatory Visit (INDEPENDENT_AMBULATORY_CARE_PROVIDER_SITE_OTHER): Payer: Medicare Other

## 2021-03-03 DIAGNOSIS — I495 Sick sinus syndrome: Secondary | ICD-10-CM | POA: Diagnosis not present

## 2021-03-03 LAB — CUP PACEART REMOTE DEVICE CHECK
Date Time Interrogation Session: 20221203094021
Implantable Pulse Generator Implant Date: 20220826

## 2021-03-06 ENCOUNTER — Other Ambulatory Visit (HOSPITAL_COMMUNITY): Payer: Self-pay | Admitting: *Deleted

## 2021-03-06 MED ORDER — DOFETILIDE 500 MCG PO CAPS: 500.0000 ug | ORAL_CAPSULE | Freq: Two times a day (BID) | ORAL | 1 refills | Status: DC

## 2021-03-07 ENCOUNTER — Other Ambulatory Visit: Payer: Medicare Other

## 2021-03-11 NOTE — Progress Notes (Signed)
Carelink Summary Report / Loop Recorder 

## 2021-03-12 ENCOUNTER — Ambulatory Visit (INDEPENDENT_AMBULATORY_CARE_PROVIDER_SITE_OTHER): Payer: Medicare Other

## 2021-03-12 ENCOUNTER — Other Ambulatory Visit: Payer: Self-pay

## 2021-03-12 VITALS — BP 151/75 | HR 57 | Temp 97.6°F | Resp 20 | Ht 63.5 in | Wt 193.6 lb

## 2021-03-12 DIAGNOSIS — J4520 Mild intermittent asthma, uncomplicated: Secondary | ICD-10-CM | POA: Diagnosis not present

## 2021-03-12 MED ORDER — FAMOTIDINE IN NACL 20-0.9 MG/50ML-% IV SOLN: 20.0000 mg | Freq: Once | INTRAVENOUS | Status: DC | PRN

## 2021-03-12 MED ORDER — EPINEPHRINE 0.3 MG/0.3ML IJ SOAJ: 0.3000 mg | Freq: Once | INTRAMUSCULAR | Status: DC | PRN

## 2021-03-12 MED ORDER — ALBUTEROL SULFATE HFA 108 (90 BASE) MCG/ACT IN AERS: 2.0000 | INHALATION_SPRAY | Freq: Once | RESPIRATORY_TRACT | Status: DC | PRN

## 2021-03-12 MED ORDER — DIPHENHYDRAMINE HCL 50 MG/ML IJ SOLN: 50.0000 mg | Freq: Once | INTRAMUSCULAR | Status: DC | PRN

## 2021-03-12 MED ORDER — METHYLPREDNISOLONE SODIUM SUCC 125 MG IJ SOLR: 125.0000 mg | Freq: Once | INTRAMUSCULAR | Status: DC | PRN

## 2021-03-12 MED ORDER — MEPOLIZUMAB 100 MG ~~LOC~~ SOLR
100.0000 mg | Freq: Once | SUBCUTANEOUS | Status: AC
  Administered 2021-03-12: 10:00:00 100 mg via SUBCUTANEOUS
  Filled 2021-03-12: qty 1

## 2021-03-12 MED ORDER — SODIUM CHLORIDE 0.9 % IV SOLN: Freq: Once | INTRAVENOUS | Status: DC | PRN

## 2021-03-12 NOTE — Progress Notes (Signed)
Diagnosis: Asthma  Provider:  Marshell Garfinkel, MD  Procedure: Injection  Nucala (Mepolizumab), Dose: 100 mg, Site: subcutaneous, Number of injections: 1  Discharge: Condition: Good, Destination: Home . AVS provided to patient.   Performed by:  Charlie Pitter, RN

## 2021-03-13 DIAGNOSIS — J019 Acute sinusitis, unspecified: Secondary | ICD-10-CM | POA: Diagnosis not present

## 2021-03-13 DIAGNOSIS — G4730 Sleep apnea, unspecified: Secondary | ICD-10-CM | POA: Diagnosis not present

## 2021-03-13 DIAGNOSIS — Z789 Other specified health status: Secondary | ICD-10-CM | POA: Diagnosis not present

## 2021-03-13 DIAGNOSIS — I10 Essential (primary) hypertension: Secondary | ICD-10-CM | POA: Diagnosis not present

## 2021-03-13 DIAGNOSIS — J449 Chronic obstructive pulmonary disease, unspecified: Secondary | ICD-10-CM | POA: Diagnosis not present

## 2021-03-13 DIAGNOSIS — Z6835 Body mass index (BMI) 35.0-35.9, adult: Secondary | ICD-10-CM | POA: Diagnosis not present

## 2021-03-13 DIAGNOSIS — Z299 Encounter for prophylactic measures, unspecified: Secondary | ICD-10-CM | POA: Diagnosis not present

## 2021-03-16 NOTE — Progress Notes (Signed)
Cardiology Office Note  Date: 03/17/2021   ID: [name] [name], DOB [birth year], MRN [id number]  PCP:  Monico Blitz, MD  Cardiologist:  Rozann Lesches, MD Electrophysiologist:  Thompson Grayer, MD   Chief Complaint  Patient presents with   Cardiac follow-up    History of Present Illness: [name] [name] is an 80 y.o. female that I last saw in June.  She has had interval follow-up in the atrial fibrillation clinic and also with Dr. Rayann Heman.  She continues on Tikosyn and is status post atrial fibrillation ablation, also had postprocedure atrial flutter.  She has an ILR in place, continues to have breakthrough atrial fibrillation, although burden of only 1.7% by most recent interrogation.  She does report intermittent shortness of breath.  I reviewed her home vital sign checks noting episodes of atrial fibrillation.  She is in sinus bradycardia today by ECG with QTc 451 ms.  CHA2DS2-VASc score is at least 5.  She continues on Xarelto.  She does not report any spontaneous bleeding problems and will have lab work with Dr. Manuella Ghazi in January.  Past Medical History:  Diagnosis Date   Allergic rhinitis    Anxiety    Asthma    Atrial flutter (HCC)    COPD (chronic obstructive pulmonary disease) ([place])    Depression    Essential hypertension    Hyperlipidemia    Lumbar disc disease    Obesity    OSA (obstructive sleep apnea)    CPAP   Paroxysmal atrial fibrillation (HCC)    Element of tachycardia bradycardia syndrome   Respiratory failure ([place])     Past Surgical History:  Procedure Laterality Date   ANTERIOR FUSION LUMBAR SPINE     ATRIAL FIBRILLATION ABLATION N/A 09/12/2020   Procedure: ATRIAL FIBRILLATION ABLATION;  Surgeon: Thompson Grayer, MD;  Location: [facility];  Service: Cardiovascular;  Laterality: N/A;   BIOPSY  08/27/2016   Procedure: BIOPSY;  Surgeon: Rogene Houston, MD;  [facility];  Service: Endoscopy;;  FOUR GASTRIC POLYPS BIOPSIED    CARDIOVERSION N/A 03/28/2014   Procedure: CARDIOVERSION;  Surgeon: Fay Records, MD;  Location: [facility];  Service: Cardiovascular;  Laterality: N/A;   CARDIOVERSION N/A 10/22/2016   Procedure: CARDIOVERSION;  Surgeon: Sueanne Margarita, MD;  Location: [facility] ENDOSCOPY;  Service: Cardiovascular;  Laterality: N/A;   CARDIOVERSION N/A 11/26/2020   Procedure: CARDIOVERSION;  Surgeon: Fay Records, MD;  Location: [facility];  Service: Cardiovascular;  Laterality: N/A;   CATARACT EXTRACTION     COLONOSCOPY N/A 08/04/2012   Procedure: COLONOSCOPY;  Surgeon: Rogene Houston, MD;  [facility];  Service: Endoscopy;  Laterality: N/A;  730-rescheduled to [facility] notified pt   ELECTROPHYSIOLOGIC STUDY N/A 09/18/2014   Procedure: Atrial Fibrillation Ablation;  Surgeon: Thompson Grayer, MD;  Location: [facility];  Service: Cardiovascular;  Laterality: N/A;   ESOPHAGOGASTRODUODENOSCOPY N/A 08/27/2016   Procedure: ESOPHAGOGASTRODUODENOSCOPY (EGD);  Surgeon: Rogene Houston, MD;  [facility];  Service: Endoscopy;  Laterality: N/A;  1200   implantable loop recorder placement  11/22/2020   Medtronic Reveal Linq model LNQ 11 (SN [id number]) implantable loop recorder   LASIK     Both eyes   TEE WITHOUT CARDIOVERSION N/A 09/18/2014   Procedure: TRANSESOPHAGEAL ECHOCARDIOGRAM (TEE);  Surgeon: Larey Dresser, MD;  Location: [facility];  Service: Cardiovascular;  Laterality: N/A;   TEE WITHOUT CARDIOVERSION N/A 10/22/2016   Procedure: TRANSESOPHAGEAL ECHOCARDIOGRAM (TEE);  Surgeon:  Sueanne Margarita, MD;  Location: [facility];  Service: Cardiovascular;  Laterality: N/A;   TEE WITHOUT CARDIOVERSION N/A 08/24/2017   Procedure: TRANSESOPHAGEAL ECHOCARDIOGRAM (TEE);  Surgeon: Josue Hector, MD;  Location: [facility] ENDOSCOPY;  Service: Cardiovascular;  Laterality: N/A;   TOTAL HIP ARTHROPLASTY  03/30/2008   Right    Current Outpatient Medications  Medication Sig Dispense Refill    acetaminophen (TYLENOL) 500 MG tablet Take 500 mg by mouth every 8 (eight) hours as needed for headache.     antiseptic oral rinse (BIOTENE) LIQD 15 mLs by Mouth Rinse route daily as needed for dry mouth.     atorvastatin (LIPITOR) 10 MG tablet Take 10 mg by mouth daily at 6 PM.     benzonatate (TESSALON) 200 MG capsule Take 1 capsule (200 mg total) by mouth 3 (three) times daily as needed for cough. 30 capsule 3   Biotin 1 MG CAPS Take 1,000 mg by mouth 3 (three) times a week.      budesonide (PULMICORT) 0.5 MG/2ML nebulizer solution Take 0.5 mg by nebulization daily.      Calcium Carbonate-Vit D-Min (QC CALCIUM-MAGNESIUM-ZINC-D3) 333.4-133 MG-UNIT TABS Take 1 tablet by mouth 2 (two) times daily.      cetirizine (ZYRTEC) 10 MG tablet Take 10 mg by mouth at bedtime as needed for allergies.     clindamycin (CLEOCIN) 300 MG capsule Take 600 mg by mouth See admin instructions. Take prior to dental procedures  0   diltiazem (CARDIZEM CD) 120 MG 24 hr capsule Take 1 capsule (120 mg total) by mouth every evening.     diltiazem (CARDIZEM CD) 240 MG 24 hr capsule Take 1 capsule (240 mg total) by mouth every morning. 30 capsule 6   diltiazem (CARDIZEM) 30 MG tablet TAKE 1 TABLET BY MOUTH AS NEEDED FOR PALPITATIONS 45 tablet 2   dofetilide (TIKOSYN) 500 MCG capsule Take 1 capsule (500 mcg total) by mouth 2 (two) times daily. 180 capsule 1   EPINEPHrine 0.3 mg/0.3 mL IJ SOAJ injection Inject 0.3 mg into the muscle as needed for anaphylaxis.     famotidine (PEPCID) 20 MG tablet Take 2 tablets (40 mg total) by mouth at bedtime as needed. 30 tablet 1   fluticasone (FLONASE) 50 MCG/ACT nasal spray Place 1 spray into both nostrils 2 (two) times daily.     hydrALAZINE (APRESOLINE) 100 MG tablet Take 100 mg by mouth 2 (two) times daily.     HYDROcodone bit-homatropine (HYCODAN) 5-1.5 MG/5ML syrup Take 5 mLs by mouth every 6 (six) hours as needed for cough.     ipratropium-albuterol (DUONEB) 0.5-2.5 (3) MG/3ML SOLN  Take 3 mLs by nebulization 4 (four) times daily as needed (Shortness of breathing).     ketoconazole (NIZORAL) 2 % shampoo Apply 1 application topically 3 (three) times a week.     losartan (COZAAR) 100 MG tablet TAKE 1 TABLET ONCE DAILY. 90 tablet 1   Magnesium 250 MG TABS Take 250 mg by mouth daily.     montelukast (SINGULAIR) 10 MG tablet Take 10 mg by mouth at bedtime.     pantoprazole (PROTONIX) 40 MG tablet Take 1 tablet (40 mg total) by mouth in the morning. Take 40 mg by mouth in the morning. 90 tablet 3   Polyethyl Glycol-Propyl Glycol (GENTEAL TEARS SEVERE DAY/NIGHT) 0.4-0.3 % GEL ophthalmic gel Place 1 application into both eyes in the morning and at bedtime.     potassium chloride SA (KLOR-CON) 20 MEQ tablet Take 1 tablet (20  mEq total) by mouth daily. 90 tablet 3   Rivaroxaban (XARELTO) 15 MG TABS tablet Take 1 tablet (15 mg total) by mouth daily with supper. 90 tablet 3   Sodium Chloride-Sodium Bicarb (AYR SALINE NASAL NETI RINSE) 1.57 g PACK Place 1 each into the nose daily as needed (rinse nasal often). Neil sinus rinse     umeclidinium-vilanterol (ANORO ELLIPTA) 62.5-25 MCG/INH AEPB INHALE ONE PUFF INTO THE LUNGS DAILY 60 each 11   Wheat Dextrin (BENEFIBER ON THE GO) POWD Take 10 mLs by mouth daily.     Current Facility-Administered Medications  Medication Dose Route Frequency Provider Last Rate Last Admin   Mepolizumab SOLR 100 mg  100 mg Subcutaneous Q28 days Baird Lyons D, MD   100 mg at 09/07/19 0947   Mepolizumab SOLR 100 mg  100 mg Subcutaneous Q28 days Baird Lyons D, MD   100 mg at 11/02/19 1123   Allergies:  Bupropion hcl, Escitalopram oxalate, Fluticasone-salmeterol, Lisinopril, and Serevent   ROS: No syncope.  Physical Exam: VS:  BP 128/72    Pulse (!) 55    Ht 5' 2.5" (1.588 m)    Wt 191 lb 3.2 oz (86.7 kg)    SpO2 99%    BMI 34.41 kg/m , BMI Body mass index is 34.41 kg/m.  Wt Readings from Last 3 Encounters:  03/17/21 191 lb 3.2 oz (86.7 kg)  03/12/21  193 lb 9.6 oz (87.8 kg)  02/12/21 194 lb 9.6 oz (88.3 kg)    General: Patient appears comfortable at rest. HEENT: Conjunctiva and lids normal, wearing a mask. Neck: Supple, no elevated JVP or carotid bruits, no thyromegaly. Lungs: Clear to auscultation, nonlabored breathing at rest. Cardiac: Regular rate and rhythm, no S3, 1/6 systolic murmur. Extremities: No pitting edema.  ECG:  An ECG dated 11/29/2020 was personally reviewed today and demonstrated:  Atrial flutter with variable conduction, IVCD/left anterior fascicular block.  Recent Labwork: 08/21/2020: Platelets 214 10/02/2020: Magnesium 2.1 11/26/2020: BUN 16; Creatinine, Ser 1.00; Hemoglobin 11.6; Potassium 4.2; Sodium 140   Other Studies Reviewed Today:  Echocardiogram 09/20/2019:  1. Left ventricular ejection fraction, by estimation, is 60 to 65%. The  left ventricle has normal function. The left ventricle has no regional  wall motion abnormalities. There is moderate left ventricular hypertrophy.  Left ventricular diastolic  parameters are consistent with Grade II diastolic dysfunction  (pseudonormalization). Elevated left atrial pressure.   2. Right ventricular systolic function is normal. The right ventricular  size is normal. There is mildly elevated pulmonary artery systolic  pressure.   3. Left atrial size was severely dilated.   4. The mitral valve is normal in structure. Trivial mitral valve  regurgitation. No evidence of mitral stenosis.   5. The aortic valve is tricuspid. Aortic valve regurgitation is not  visualized. No aortic stenosis is present.   6. Indeterminant PASP, inadequate TR jet.   7. The inferior vena cava is dilated in size with >50% respiratory  variability, suggesting right atrial pressure of 8 mmHg.   Lexiscan Myoview 02/01/2020: There was no ST segment deviation noted during stress. This is a low risk study. The left ventricular ejection fraction is normal (55-65%). Anterior defect represents  small prior infarct with mild peri-infarct ischemia vs mild differences in breast attenuation. Either finding would support low risk.  Assessment and Plan:  1.  Persistent atrial fibrillation with CHA2DS2-VASc score of at least 5.  She is status post atrial fibrillation ablation, also has ILR in place for surveillance.  She remains on Tikosyn and Cardizem CD along with Xarelto.  She does have breakthrough episodes of atrial fibrillation and has had some atrial flutter as well, but rhythm burden relatively low fortunately.  She is in sinus bradycardia today by ECG.  Keep follow-up in the atrial fibrillation clinic and with Dr. Rayann Heman.  She will have follow-up lab work in January with PCP.  2.  Tachycardia-bradycardia syndrome.  Continue observation for now.  No syncope.  Medication Adjustments/Labs and Tests Ordered: Current medicines are reviewed at length with the patient today.  Concerns regarding medicines are outlined above.   Tests Ordered: No orders of the defined types were placed in this encounter.   Medication Changes: No orders of the defined types were placed in this encounter.   Disposition:  Follow up  6 months.  Signed, Satira Sark, MD, [facility] 03/17/2021 8:50 AM    [facility] [place], [place], [place] [address] Phone: [phone]; Fax: [phone]

## 2021-03-17 ENCOUNTER — Ambulatory Visit (INDEPENDENT_AMBULATORY_CARE_PROVIDER_SITE_OTHER): Payer: Medicare Other | Admitting: Cardiology

## 2021-03-17 ENCOUNTER — Other Ambulatory Visit: Payer: Self-pay

## 2021-03-17 ENCOUNTER — Encounter: Payer: Self-pay | Admitting: Cardiology

## 2021-03-17 VITALS — BP 128/72 | HR 55 | Ht 62.5 in | Wt 191.2 lb

## 2021-03-17 DIAGNOSIS — I4819 Other persistent atrial fibrillation: Secondary | ICD-10-CM

## 2021-03-17 DIAGNOSIS — I495 Sick sinus syndrome: Secondary | ICD-10-CM | POA: Diagnosis not present

## 2021-03-17 NOTE — Patient Instructions (Signed)

## 2021-03-17 NOTE — Addendum Note (Signed)
Addended by: Laurine Blazer on: 03/17/2021 09:17 AM   Modules accepted: Orders

## 2021-03-28 DIAGNOSIS — I10 Essential (primary) hypertension: Secondary | ICD-10-CM | POA: Diagnosis not present

## 2021-03-31 ENCOUNTER — Other Ambulatory Visit (INDEPENDENT_AMBULATORY_CARE_PROVIDER_SITE_OTHER): Payer: Self-pay | Admitting: Gastroenterology

## 2021-03-31 ENCOUNTER — Other Ambulatory Visit (HOSPITAL_COMMUNITY): Payer: Self-pay | Admitting: Nurse Practitioner

## 2021-03-31 DIAGNOSIS — K219 Gastro-esophageal reflux disease without esophagitis: Secondary | ICD-10-CM

## 2021-04-07 ENCOUNTER — Ambulatory Visit (INDEPENDENT_AMBULATORY_CARE_PROVIDER_SITE_OTHER): Payer: Medicare Other

## 2021-04-07 DIAGNOSIS — I495 Sick sinus syndrome: Secondary | ICD-10-CM

## 2021-04-08 LAB — CUP PACEART REMOTE DEVICE CHECK
Date Time Interrogation Session: 20230110095326
Implantable Pulse Generator Implant Date: 20220826

## 2021-04-09 ENCOUNTER — Ambulatory Visit (INDEPENDENT_AMBULATORY_CARE_PROVIDER_SITE_OTHER): Payer: Medicare Other

## 2021-04-09 ENCOUNTER — Other Ambulatory Visit: Payer: Self-pay

## 2021-04-09 VITALS — BP 173/58 | HR 53 | Temp 97.8°F | Resp 22 | Ht 63.0 in | Wt 194.2 lb

## 2021-04-09 DIAGNOSIS — J4520 Mild intermittent asthma, uncomplicated: Secondary | ICD-10-CM

## 2021-04-09 DIAGNOSIS — M9905 Segmental and somatic dysfunction of pelvic region: Secondary | ICD-10-CM | POA: Diagnosis not present

## 2021-04-09 DIAGNOSIS — M546 Pain in thoracic spine: Secondary | ICD-10-CM | POA: Diagnosis not present

## 2021-04-09 DIAGNOSIS — M542 Cervicalgia: Secondary | ICD-10-CM | POA: Diagnosis not present

## 2021-04-09 DIAGNOSIS — M9903 Segmental and somatic dysfunction of lumbar region: Secondary | ICD-10-CM | POA: Diagnosis not present

## 2021-04-09 DIAGNOSIS — M9901 Segmental and somatic dysfunction of cervical region: Secondary | ICD-10-CM | POA: Diagnosis not present

## 2021-04-09 DIAGNOSIS — M48061 Spinal stenosis, lumbar region without neurogenic claudication: Secondary | ICD-10-CM | POA: Diagnosis not present

## 2021-04-09 DIAGNOSIS — M79671 Pain in right foot: Secondary | ICD-10-CM | POA: Diagnosis not present

## 2021-04-09 DIAGNOSIS — M79672 Pain in left foot: Secondary | ICD-10-CM | POA: Diagnosis not present

## 2021-04-09 DIAGNOSIS — M9902 Segmental and somatic dysfunction of thoracic region: Secondary | ICD-10-CM | POA: Diagnosis not present

## 2021-04-09 MED ORDER — DIPHENHYDRAMINE HCL 50 MG/ML IJ SOLN: 50.0000 mg | Freq: Once | INTRAMUSCULAR | Status: DC | PRN

## 2021-04-09 MED ORDER — SODIUM CHLORIDE 0.9 % IV SOLN: Freq: Once | INTRAVENOUS | Status: DC | PRN

## 2021-04-09 MED ORDER — ALBUTEROL SULFATE HFA 108 (90 BASE) MCG/ACT IN AERS: 2.0000 | INHALATION_SPRAY | Freq: Once | RESPIRATORY_TRACT | Status: DC | PRN

## 2021-04-09 MED ORDER — METHYLPREDNISOLONE SODIUM SUCC 125 MG IJ SOLR: 125.0000 mg | Freq: Once | INTRAMUSCULAR | Status: DC | PRN

## 2021-04-09 MED ORDER — MEPOLIZUMAB 100 MG ~~LOC~~ SOLR
100.0000 mg | Freq: Once | SUBCUTANEOUS | Status: AC
  Administered 2021-04-09: 100 mg via SUBCUTANEOUS
  Filled 2021-04-09: qty 1

## 2021-04-09 MED ORDER — EPINEPHRINE 0.3 MG/0.3ML IJ SOAJ: 0.3000 mg | Freq: Once | INTRAMUSCULAR | Status: DC | PRN

## 2021-04-09 MED ORDER — FAMOTIDINE IN NACL 20-0.9 MG/50ML-% IV SOLN: 20.0000 mg | Freq: Once | INTRAVENOUS | Status: DC | PRN

## 2021-04-09 NOTE — Progress Notes (Signed)
Diagnosis: Mild intermittent asthma  Provider:  Marshell Garfinkel, MD  Procedure: Injection  Nucala (Mepolizumab), Dose: 100 mg, Site: subcutaneous, Number of injections: 1  Discharge: Condition: Good, Destination: Home . AVS provided to patient.   Performed by:

## 2021-04-10 DIAGNOSIS — L905 Scar conditions and fibrosis of skin: Secondary | ICD-10-CM | POA: Diagnosis not present

## 2021-04-10 DIAGNOSIS — C44622 Squamous cell carcinoma of skin of right upper limb, including shoulder: Secondary | ICD-10-CM | POA: Diagnosis not present

## 2021-04-11 ENCOUNTER — Ambulatory Visit: Payer: Medicare Other | Admitting: Podiatry

## 2021-04-11 ENCOUNTER — Encounter: Payer: Self-pay | Admitting: Podiatry

## 2021-04-11 ENCOUNTER — Ambulatory Visit (INDEPENDENT_AMBULATORY_CARE_PROVIDER_SITE_OTHER): Payer: Medicare Other | Admitting: Podiatry

## 2021-04-11 ENCOUNTER — Other Ambulatory Visit: Payer: Self-pay

## 2021-04-11 DIAGNOSIS — M9901 Segmental and somatic dysfunction of cervical region: Secondary | ICD-10-CM | POA: Diagnosis not present

## 2021-04-11 DIAGNOSIS — M546 Pain in thoracic spine: Secondary | ICD-10-CM | POA: Diagnosis not present

## 2021-04-11 DIAGNOSIS — B351 Tinea unguium: Secondary | ICD-10-CM

## 2021-04-11 DIAGNOSIS — M79674 Pain in right toe(s): Secondary | ICD-10-CM

## 2021-04-11 DIAGNOSIS — M9903 Segmental and somatic dysfunction of lumbar region: Secondary | ICD-10-CM | POA: Diagnosis not present

## 2021-04-11 DIAGNOSIS — M79675 Pain in left toe(s): Secondary | ICD-10-CM

## 2021-04-11 DIAGNOSIS — M79672 Pain in left foot: Secondary | ICD-10-CM | POA: Diagnosis not present

## 2021-04-11 DIAGNOSIS — M9905 Segmental and somatic dysfunction of pelvic region: Secondary | ICD-10-CM | POA: Diagnosis not present

## 2021-04-11 DIAGNOSIS — M48061 Spinal stenosis, lumbar region without neurogenic claudication: Secondary | ICD-10-CM | POA: Diagnosis not present

## 2021-04-11 DIAGNOSIS — M9902 Segmental and somatic dysfunction of thoracic region: Secondary | ICD-10-CM | POA: Diagnosis not present

## 2021-04-11 DIAGNOSIS — M542 Cervicalgia: Secondary | ICD-10-CM | POA: Diagnosis not present

## 2021-04-11 DIAGNOSIS — M79671 Pain in right foot: Secondary | ICD-10-CM | POA: Diagnosis not present

## 2021-04-14 DIAGNOSIS — I10 Essential (primary) hypertension: Secondary | ICD-10-CM | POA: Diagnosis not present

## 2021-04-14 DIAGNOSIS — Z299 Encounter for prophylactic measures, unspecified: Secondary | ICD-10-CM | POA: Diagnosis not present

## 2021-04-14 DIAGNOSIS — J449 Chronic obstructive pulmonary disease, unspecified: Secondary | ICD-10-CM | POA: Diagnosis not present

## 2021-04-14 DIAGNOSIS — I4891 Unspecified atrial fibrillation: Secondary | ICD-10-CM | POA: Diagnosis not present

## 2021-04-14 DIAGNOSIS — Z789 Other specified health status: Secondary | ICD-10-CM | POA: Diagnosis not present

## 2021-04-14 DIAGNOSIS — Z6835 Body mass index (BMI) 35.0-35.9, adult: Secondary | ICD-10-CM | POA: Diagnosis not present

## 2021-04-14 NOTE — Progress Notes (Signed)
°  Subjective:  Patient ID: [name] [name], female    DOB: Jul 27, 1940,  MRN: [id number]  [name] [name] presents to clinic today for painful elongated mycotic toenails 1-5 bilaterally which are tender when wearing enclosed shoe gear. Pain is relieved with periodic professional debridement.  PCP is Monico Blitz, MD , and last visit was December, 2022.  Allergies  Allergen Reactions   Bupropion Hcl Other (See Comments)    Suicidal Thoughts.    Escitalopram Oxalate Other (See Comments)    Suicidal Thoughts.    Fluticasone-Salmeterol Other (See Comments)    Advair - Caused patient to go into Afib.    Lisinopril Cough   Serevent Other (See Comments)    Caused patient to go into Afib    Review of Systems: Negative except as noted in the HPI. Objective:   Constitutional BICH MCHANEY is a pleasant 80 y.o. Caucasian female, obese in NAD. AAO x 3.   Vascular CFT immediate b/l LE. Palpable DP/PT pulses b/l LE. Digital hair sparse b/l. Skin temperature gradient WNL b/l. No pain with calf compression b/l. Trace edema b/l LE. No cyanosis or clubbing noted b/l LE.  Neurologic Normal speech. Oriented to person, place, and time. Protective sensation intact 5/5 intact bilaterally with 10g monofilament b/l.  Dermatologic Pedal integument with normal turgor, texture and tone b/l LE. No open wounds b/l. No interdigital macerations b/l. Toenails 1-5 b/l elongated, thickened, discolored with subungual debris. +Tenderness with dorsal palpation of nailplates. No hyperkeratotic or porokeratotic lesions present.  Orthopedic: Normal muscle strength 5/5 to all lower extremity muscle groups bilaterally. 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 Pes planus deformity noted bilateral LE.Marland Kitchen No pain, crepitus or joint limitation noted with ROM b/l LE.  Patient ambulates independently without assistive aids.   Radiographs: None  Assessment:   1. Pain due to onychomycosis of toenails of both feet    Plan:   Patient was evaluated and treated and all questions answered. Consent given for treatment as described below: -No new findings. No new orders. -Mycotic toenails 1-5 bilaterally were debrided in length and girth with sterile nail nippers and dremel without incident. -Patient/POA to call should there be question/concern in the interim.  Return in about 10 weeks (around 06/20/2021).  Marzetta Board, DPM

## 2021-04-15 NOTE — Progress Notes (Signed)
Carelink Summary Report / Loop Recorder 

## 2021-04-16 ENCOUNTER — Other Ambulatory Visit: Payer: Self-pay

## 2021-04-16 ENCOUNTER — Ambulatory Visit (HOSPITAL_COMMUNITY)
Admission: RE | Admit: 2021-04-16 | Discharge: 2021-04-16 | Disposition: A | Discharge status: Home / Self Care | Payer: Medicare Other | Source: Ambulatory Visit | Attending: Nurse Practitioner | Admitting: Nurse Practitioner

## 2021-04-16 ENCOUNTER — Encounter (HOSPITAL_COMMUNITY): Payer: Self-pay | Admitting: Nurse Practitioner

## 2021-04-16 VITALS — BP 142/66 | HR 99 | Ht 63.0 in | Wt 194.4 lb

## 2021-04-16 DIAGNOSIS — R9431 Abnormal electrocardiogram [ECG] [EKG]: Secondary | ICD-10-CM | POA: Insufficient documentation

## 2021-04-16 DIAGNOSIS — I4891 Unspecified atrial fibrillation: Secondary | ICD-10-CM | POA: Diagnosis not present

## 2021-04-16 DIAGNOSIS — Z8249 Family history of ischemic heart disease and other diseases of the circulatory system: Secondary | ICD-10-CM | POA: Diagnosis not present

## 2021-04-16 DIAGNOSIS — I517 Cardiomegaly: Secondary | ICD-10-CM | POA: Diagnosis not present

## 2021-04-16 DIAGNOSIS — I4892 Unspecified atrial flutter: Secondary | ICD-10-CM | POA: Insufficient documentation

## 2021-04-16 DIAGNOSIS — Z7901 Long term (current) use of anticoagulants: Secondary | ICD-10-CM | POA: Diagnosis not present

## 2021-04-16 DIAGNOSIS — D6869 Other thrombophilia: Secondary | ICD-10-CM

## 2021-04-16 LAB — BASIC METABOLIC PANEL
Anion gap: 8 (ref 5–15)
BUN: 17 mg/dL (ref 8–23)
CO2: 23 mmol/L (ref 22–32)
Calcium: 9.2 mg/dL (ref 8.9–10.3)
Chloride: 107 mmol/L (ref 98–111)
Creatinine, Ser: 1.19 mg/dL — ABNORMAL HIGH (ref 0.44–1.00)
GFR, Estimated: 46 mL/min — ABNORMAL LOW (ref >60)
Glucose, Bld: 125 mg/dL — ABNORMAL HIGH (ref 70–99)
Potassium: 3.9 mmol/L (ref 3.5–5.1)
Sodium: 138 mmol/L (ref 135–145)

## 2021-04-16 LAB — MAGNESIUM: Magnesium: 2.2 mg/dL (ref 1.7–2.4)

## 2021-04-16 NOTE — Progress Notes (Signed)
Primary Care Physician: Monico Blitz, MD Referring Physician: Dr. [name] is a 81 y.o. female with a h/o afib that had an afib ablation 6/16. Also on Tikosyn for afib management. She also has a Linq. Overall afib/flutter burden is low (1.7% 03/2021 report)  but she has felt her heart rate was elevated since Sunday. Ekg today shows probable  atrial flutter at 99 bpm. Her  qt is reading prolonged today probably secondary to arrhythmia. Continues on Tikosyn 500 mcg bid.  Continues on eliquis with a CHA2DS2VASc  score of at least 5.  She recently had a skin lesion removed from her rt wrist and it oozed blood steadily after the procedure so he held the blood thinner x one day. The bleeding has stopped.   Today, she denies symptoms of palpitations, chest pain, shortness of breath, orthopnea, PND, lower extremity edema, dizziness, presyncope, syncope, or neurologic sequela. The patient is tolerating medications without difficulties and is otherwise without complaint today.   Past Medical History:  Diagnosis Date   Allergic rhinitis    Anxiety    Asthma    Atrial flutter (HCC)    COPD (chronic obstructive pulmonary disease) ([place])    Depression    Essential hypertension    Hyperlipidemia    Lumbar disc disease    Obesity    OSA (obstructive sleep apnea)    CPAP   Paroxysmal atrial fibrillation (HCC)    Element of tachycardia bradycardia syndrome   Respiratory failure ([facility])    Past Surgical History:  Procedure Laterality Date   ANTERIOR FUSION LUMBAR SPINE     ATRIAL FIBRILLATION ABLATION N/A 09/12/2020   Procedure: ATRIAL FIBRILLATION ABLATION;  Surgeon: Thompson Grayer, MD;  Location: [place] [facility];  Service: Cardiovascular;  Laterality: N/A;   BIOPSY  08/27/2016   Procedure: BIOPSY;  Surgeon: Rogene Houston, MD;  [facility];  Service: Endoscopy;;  FOUR GASTRIC POLYPS BIOPSIED   CARDIOVERSION N/A 03/28/2014   Procedure: CARDIOVERSION;  Surgeon:  Fay Records, MD;  Location: [facility];  Service: Cardiovascular;  Laterality: N/A;   CARDIOVERSION N/A 10/22/2016   Procedure: CARDIOVERSION;  Surgeon: Sueanne Margarita, MD;  Location: [facility] END[facility]COPY;  Service: Cardiovascular;  Laterality: N/A;   CARDIOVERSION N/A 11/26/2020   Procedure: CARDIOVERSION;  Surgeon: Fay Records, MD;  Location: [place];  Service: Cardiovascular;  Laterality: N/A;   CATARACT EXTRACTION     COLONOSCOPY N/A 08/04/2012   Procedure: COLONOSCOPY;  Surgeon: Rogene Houston, MD;  [facility];  Service: Endoscopy;  Laterality: N/A;  730-rescheduled to [place] notified pt   ELECTROPHYSIOLOGIC STUDY N/A 09/18/2014   Procedure: Atrial Fibrillation Ablation;  Surgeon: Thompson Grayer, MD;  Location: [place] [facility];  Service: Cardiovascular;  Laterality: N/A;   ESOPHAGOGASTRODUODENOSCOPY N/A 08/27/2016   Procedure: ESOPHAGOGASTRODUODENOSCOPY (EGD);  Surgeon: Rogene Houston, MD;  [facility];  Service: Endoscopy;  Laterality: N/A;  1200   implantable loop recorder placement  11/22/2020   Medtronic Reveal Linq model LNQ 11 (SN [id number]) implantable loop recorder   LASIK     Both eyes   TEE WITHOUT CARDIOVERSION N/A 09/18/2014   Procedure: TRANSESOPHAGEAL ECHOCARDIOGRAM (TEE);  Surgeon: Larey Dresser, MD;  Location: [place];  Service: Cardiovascular;  Laterality: N/A;   TEE WITHOUT CARDIOVERSION N/A 10/22/2016   Procedure: TRANSESOPHAGEAL ECHOCARDIOGRAM (TEE);  Surgeon: Sueanne Margarita, MD;  Location: [place];  Service: Cardiovascular;  Laterality: N/A;  TEE WITHOUT CARDIOVERSION N/A 08/24/2017   Procedure: TRANSESOPHAGEAL ECHOCARDIOGRAM (TEE);  Surgeon: Josue Hector, MD;  [facility] ENDOSCOPY;  Service: Cardiovascular;  Laterality: N/A;   TOTAL HIP ARTHROPLASTY  03/30/2008   Right    Current Outpatient Medications  Medication Sig Dispense Refill   acetaminophen (TYLENOL) 500 MG tablet Take 500 mg by mouth every 8 (eight) hours  as needed for headache.     antiseptic oral rinse (BIOTENE) LIQD 15 mLs by Mouth Rinse route daily as needed for dry mouth.     atorvastatin (LIPITOR) 10 MG tablet Take 10 mg by mouth daily at 6 PM.     benzonatate (TESSALON) 200 MG capsule Take 1 capsule (200 mg total) by mouth 3 (three) times daily as needed for cough. 30 capsule 3   Biotin 1 MG CAPS Take 1,000 mg by mouth 3 (three) times a week.      budesonide (PULMICORT) 0.5 MG/2ML nebulizer solution Take 0.5 mg by nebulization daily.      Calcium Carbonate-Vit D-Min (QC CALCIUM-MAGNESIUM-ZINC-D3) 333.4-133 MG-UNIT TABS Take 1 tablet by mouth 2 (two) times daily.      cetirizine (ZYRTEC) 10 MG tablet Take 10 mg by mouth at bedtime as needed for allergies.     clindamycin (CLEOCIN) 300 MG capsule Take 600 mg by mouth See admin instructions. Take prior to dental procedures  0   diltiazem (CARDIZEM CD) 120 MG 24 hr capsule Take 1 capsule (120 mg total) by mouth every evening.     diltiazem (CARDIZEM CD) 240 MG 24 hr capsule Take 1 capsule (240 mg total) by mouth every morning. 30 capsule 6   diltiazem (CARDIZEM) 30 MG tablet TAKE 1 TABLET BY MOUTH AS NEEDED FOR PALPITATIONS 45 tablet 2   dofetilide (TIKOSYN) 500 MCG capsule Take 1 capsule (500 mcg total) by mouth 2 (two) times daily. 180 capsule 1   EPINEPHrine 0.3 mg/0.3 mL IJ SOAJ injection Inject 0.3 mg into the muscle as needed for anaphylaxis.     famotidine (PEPCID) 20 MG tablet Take 2 tablets (40 mg total) by mouth at bedtime as needed. 30 tablet 1   fluticasone (FLONASE) 50 MCG/ACT nasal spray Place 1 spray into both nostrils 2 (two) times daily.     hydrALAZINE (APRESOLINE) 100 MG tablet Take 100 mg by mouth 2 (two) times daily.     HYDROcodone bit-homatropine (HYCODAN) 5-1.5 MG/5ML syrup Take 5 mLs by mouth every 6 (six) hours as needed for cough.     ipratropium-albuterol (DUONEB) 0.5-2.5 (3) MG/3ML SOLN Take 3 mLs by nebulization 4 (four) times daily as needed (Shortness of  breathing).     losartan (COZAAR) 100 MG tablet TAKE 1 TABLET BY MOUTH DAILY 90 tablet 3   Magnesium 250 MG TABS Take 250 mg by mouth daily.     montelukast (SINGULAIR) 10 MG tablet Take 10 mg by mouth at bedtime.     pantoprazole (PROTONIX) 40 MG tablet Take one tablet qam. NEEDS OFFICE VISIT 90 tablet 0   Polyethyl Glycol-Propyl Glycol (GENTEAL TEARS SEVERE DAY/NIGHT) 0.4-0.3 % GEL ophthalmic gel Place 1 application into both eyes in the morning and at bedtime.     potassium chloride SA (KLOR-CON M) 20 MEQ tablet TAKE 1 TABLET BY MOUTH DAILY 90 tablet 3   Rivaroxaban (XARELTO) 15 MG TABS tablet Take 1 tablet (15 mg total) by mouth daily with supper. 90 tablet 3   Sodium Chloride-Sodium Bicarb (AYR SALINE NASAL NETI RINSE) 1.57 g PACK Place 1 each into  the nose daily as needed (rinse nasal often). Neil sinus rinse     umeclidinium-vilanterol (ANORO ELLIPTA) 62.5-25 MCG/INH AEPB INHALE ONE PUFF INTO THE LUNGS DAILY 60 each 11   VENTOLIN HFA 108 (90 Base) MCG/ACT inhaler Inhale 2 puffs into the lungs every 4 (four) hours as needed.     Wheat Dextrin (BENEFIBER ON THE GO) POWD Take 10 mLs by mouth daily.     fluorouracil (EFUDEX) 5 % cream Apply topically 2 (two) times daily. (Patient not taking: Reported on 04/16/2021)     ketoconazole (NIZORAL) 2 % shampoo Apply 1 application topically 3 (three) times a week. (Patient not taking: Reported on 04/16/2021)     No current facility-administered medications for this encounter.    Allergies  Allergen Reactions   Bupropion Hcl Other (See Comments)    Suicidal Thoughts.    Escitalopram Oxalate Other (See Comments)    Suicidal Thoughts.    Fluticasone-Salmeterol Other (See Comments)    Advair - Caused patient to go into Afib.    Lisinopril Cough   Serevent Other (See Comments)    Caused patient to go into Afib    Social History   Socioeconomic History   Marital status: Single    Spouse name: Not on file   Number of children: Not on file    Years of education: Not on file   Highest education level: Not on file  Occupational History   Occupation: Retired: Presenter, broadcasting  Tobacco Use   Smoking status: Never   Smokeless tobacco: Never  Vaping Use   Vaping Use: Never used  Substance and Sexual Activity   Alcohol use: No    Alcohol/week: 0.0 standard drinks   Drug use: No   Sexual activity: Not on file  Other Topics Concern   Not on file  Social History Narrative   Pt lives in [place] alone. She was never married.   Retired Customer service manager.   Attends [employer]   Social Determinants of Health   Financial Resource Strain: Not on file  Food Insecurity: Not on file  Transportation Needs: Not on file  Physical Activity: Not on file  Stress: Not on file  Social Connections: Not on file  Intimate Partner Violence: Not on file    Family History  Problem Relation Age of Onset   Cancer Mother    Heart attack Father    Colon cancer Other     ROS- All systems are reviewed and negative except as per the HPI above  Physical Exam: Vitals:   04/16/21 0951  BP: (!) 142/66  Pulse: 99  Weight: 88.2 kg  Height: 5\' 3"  (1.6 m)   Wt Readings from Last 3 Encounters:  04/16/21 88.2 kg  04/09/21 88.1 kg  03/17/21 86.7 kg    Labs: Lab Results  Component Value Date   NA 140 11/26/2020   K 4.2 11/26/2020   CL 108 11/26/2020   CO2 24 10/02/2020   GLUCOSE 95 11/26/2020   BUN 16 11/26/2020   CREATININE 1.00 11/26/2020   CALCIUM 9.0 10/02/2020   MG 2.1 10/02/2020   Lab Results  Component Value Date   INR 2.0 (H) 09/19/2019   No results found for: CHOL, HDL, LDLCALC, TRIG   GEN- The patient is well appearing, alert and oriented x 3 today.   Head- normocephalic, atraumatic Eyes-  Sclera clear, conjunctiva pink Ears- hearing intact Oropharynx- clear Neck- supple, no JVP Lymph- no cervical lymphadenopathy Lungs- Clear to ausculation bilaterally, normal  work of breathing Heart- Regular rate and  rhythm, no murmurs, rubs or gallops, PMI not laterally displaced GI- soft, NT, ND, + BS Extremities- no clubbing, cyanosis, or edema MS- no significant deformity or atrophy Skin- no rash or lesion Psych- euthymic mood, full affect Neuro- strength and sensation are intact  EKG-probable atrail flutter at 99 bpm, qrs int 106 ms, qtc 526( probably longer appearing 2/2 arrhythmia)    Assessment and Plan:  1. Atrail fib S/p ablation June 2022 She is now in atrial flutter   Qt appearing longer today I think 2/2 arrhythmia  She will have an ekg in [place] office next week and forward to me so hopefully she will be in rhythm and qtc will be in range LInq report reviewed, it does appear to have elevated heart rate around 100 starting Sunday   Bmet/cbc today  If remains out of rhythm will arrange for CV at [facility]   2, HTN  Stable   3. CHA2DS2VASc  score of at least 5  Continue eliquis   Will reassess after ekg next week otherwise f/u in 6 months    Butch Penny C. Reema Chick, [facility] [address] [place], [place] [address] [phone]

## 2021-04-17 ENCOUNTER — Ambulatory Visit (HOSPITAL_COMMUNITY): Payer: Medicare Other | Admitting: Nurse Practitioner

## 2021-04-24 ENCOUNTER — Other Ambulatory Visit: Payer: Self-pay

## 2021-04-24 ENCOUNTER — Ambulatory Visit (INDEPENDENT_AMBULATORY_CARE_PROVIDER_SITE_OTHER): Payer: Medicare Other | Admitting: *Deleted

## 2021-04-24 DIAGNOSIS — I480 Paroxysmal atrial fibrillation: Secondary | ICD-10-CM

## 2021-04-24 NOTE — Progress Notes (Signed)
Presents to office for nurse visit to have EKG ordered by Roderic Palau at recent visit.  EKG done and routed to Roderic Palau for review via [employer]

## 2021-04-24 NOTE — Patient Instructions (Signed)
You will be contacted with any changes after your EKG is reviewed by your provider

## 2021-04-27 ENCOUNTER — Other Ambulatory Visit: Payer: Self-pay | Admitting: Pharmacy Technician

## 2021-04-27 ENCOUNTER — Other Ambulatory Visit: Payer: Self-pay | Admitting: Internal Medicine

## 2021-04-27 DIAGNOSIS — I10 Essential (primary) hypertension: Secondary | ICD-10-CM | POA: Diagnosis not present

## 2021-04-28 NOTE — Telephone Encounter (Signed)
Prescription refill request for Xarelto received.  Indication: Afib  Last office visit: 04/16/21 Kayleen Memos)  KTGYBW:38.9HT Age: 81 Scr: 1.19 (04/16/21)  CrCl: 51.36ml/min  On office visit note on 04/16/21, Kayleen Memos, NP stated pt is on Eliquis; however, refill request is for Xarelto. Called pt to clarify. No answer, LMOM

## 2021-04-28 NOTE — Telephone Encounter (Signed)
Per Fuller Canada, Pharmacist pt's dose of Xarelto should be increased to 20mg . Called pt and made her aware of dose change. Appropriate dose and refill sent to requested pharmacy.

## 2021-05-07 ENCOUNTER — Ambulatory Visit (INDEPENDENT_AMBULATORY_CARE_PROVIDER_SITE_OTHER): Payer: Medicare Other | Admitting: *Deleted

## 2021-05-07 ENCOUNTER — Other Ambulatory Visit: Payer: Self-pay

## 2021-05-07 VITALS — BP 156/60 | HR 50 | Temp 97.9°F | Resp 19 | Ht 62.0 in | Wt 197.2 lb

## 2021-05-07 DIAGNOSIS — M79671 Pain in right foot: Secondary | ICD-10-CM | POA: Diagnosis not present

## 2021-05-07 DIAGNOSIS — M542 Cervicalgia: Secondary | ICD-10-CM | POA: Diagnosis not present

## 2021-05-07 DIAGNOSIS — M79672 Pain in left foot: Secondary | ICD-10-CM | POA: Diagnosis not present

## 2021-05-07 DIAGNOSIS — M546 Pain in thoracic spine: Secondary | ICD-10-CM | POA: Diagnosis not present

## 2021-05-07 DIAGNOSIS — M9903 Segmental and somatic dysfunction of lumbar region: Secondary | ICD-10-CM | POA: Diagnosis not present

## 2021-05-07 DIAGNOSIS — J4520 Mild intermittent asthma, uncomplicated: Secondary | ICD-10-CM | POA: Diagnosis not present

## 2021-05-07 DIAGNOSIS — M9905 Segmental and somatic dysfunction of pelvic region: Secondary | ICD-10-CM | POA: Diagnosis not present

## 2021-05-07 DIAGNOSIS — M9902 Segmental and somatic dysfunction of thoracic region: Secondary | ICD-10-CM | POA: Diagnosis not present

## 2021-05-07 DIAGNOSIS — M48061 Spinal stenosis, lumbar region without neurogenic claudication: Secondary | ICD-10-CM | POA: Diagnosis not present

## 2021-05-07 DIAGNOSIS — M9901 Segmental and somatic dysfunction of cervical region: Secondary | ICD-10-CM | POA: Diagnosis not present

## 2021-05-07 MED ORDER — SODIUM CHLORIDE 0.9 % IV SOLN: Freq: Once | INTRAVENOUS | Status: DC | PRN

## 2021-05-07 MED ORDER — ALBUTEROL SULFATE HFA 108 (90 BASE) MCG/ACT IN AERS: 2.0000 | INHALATION_SPRAY | Freq: Once | RESPIRATORY_TRACT | Status: DC | PRN

## 2021-05-07 MED ORDER — MEPOLIZUMAB 100 MG ~~LOC~~ SOLR
100.0000 mg | Freq: Once | SUBCUTANEOUS | Status: AC
  Administered 2021-05-07: 100 mg via SUBCUTANEOUS
  Filled 2021-05-07: qty 1

## 2021-05-07 MED ORDER — FAMOTIDINE IN NACL 20-0.9 MG/50ML-% IV SOLN: 20.0000 mg | Freq: Once | INTRAVENOUS | Status: DC | PRN

## 2021-05-07 MED ORDER — DIPHENHYDRAMINE HCL 50 MG/ML IJ SOLN: 50.0000 mg | Freq: Once | INTRAMUSCULAR | Status: DC | PRN

## 2021-05-07 MED ORDER — METHYLPREDNISOLONE SODIUM SUCC 125 MG IJ SOLR: 125.0000 mg | Freq: Once | INTRAMUSCULAR | Status: DC | PRN

## 2021-05-07 MED ORDER — EPINEPHRINE 0.3 MG/0.3ML IJ SOAJ: 0.3000 mg | Freq: Once | INTRAMUSCULAR | Status: DC | PRN

## 2021-05-07 NOTE — Progress Notes (Signed)
Diagnosis: Mild intermittent asthma  Provider:  Marshell Garfinkel, MD  Procedure: Injection  Nucala (Mepolizumab), Dose: 100 mg, Site: subcutaneous, Number of injections: 1  Discharge: Condition: Good, Destination: Home . AVS provided to patient.   Performed by:  Ludwig Lean, RN

## 2021-05-12 ENCOUNTER — Ambulatory Visit (INDEPENDENT_AMBULATORY_CARE_PROVIDER_SITE_OTHER): Payer: Medicare Other

## 2021-05-12 DIAGNOSIS — I495 Sick sinus syndrome: Secondary | ICD-10-CM | POA: Diagnosis not present

## 2021-05-12 LAB — CUP PACEART REMOTE DEVICE CHECK
Date Time Interrogation Session: 20230212233510
Implantable Pulse Generator Implant Date: 20220826

## 2021-05-14 DIAGNOSIS — Z299 Encounter for prophylactic measures, unspecified: Secondary | ICD-10-CM | POA: Diagnosis not present

## 2021-05-14 DIAGNOSIS — J019 Acute sinusitis, unspecified: Secondary | ICD-10-CM | POA: Diagnosis not present

## 2021-05-14 DIAGNOSIS — I10 Essential (primary) hypertension: Secondary | ICD-10-CM | POA: Diagnosis not present

## 2021-05-14 NOTE — Progress Notes (Signed)
Carelink Summary Report / Loop Recorder 

## 2021-05-15 NOTE — Progress Notes (Signed)
Carelink Summary Report / Loop Recorder 

## 2021-05-27 ENCOUNTER — Telehealth: Payer: Self-pay | Admitting: *Deleted

## 2021-05-27 DIAGNOSIS — R931 Abnormal findings on diagnostic imaging of heart and coronary circulation: Secondary | ICD-10-CM

## 2021-05-27 DIAGNOSIS — I10 Essential (primary) hypertension: Secondary | ICD-10-CM | POA: Diagnosis not present

## 2021-05-27 NOTE — Telephone Encounter (Signed)
° °  Pre-operative Risk Assessment    Patient Name: [name] [name]  DOB: 12/08/1940 MRN: [id number]      Request for Surgical Clearance    Procedure:   CHEEK Bx   Date of Surgery:  Clearance TBD                                 Surgeon:  DR. Romie Minus Surgeon's Group or Practice Name:  [facility] Phone number:  [phone] Fax number:  [phone] ATTN: Ian Malkin   Type of Clearance Requested:   - Medical  - Pharmacy:  Hold Rivaroxaban (Xarelto)     Type of Anesthesia:   LIDOCAINE   Additional requests/questions:    Jiles Prows   05/27/2021, 1:46 PM

## 2021-05-29 DIAGNOSIS — R931 Abnormal findings on diagnostic imaging of heart and coronary circulation: Secondary | ICD-10-CM | POA: Insufficient documentation

## 2021-05-29 NOTE — Telephone Encounter (Signed)
Patient with diagnosis of afib on Xarelto for anticoagulation.   ? ?Procedure: cheek biopsy ?Date of procedure: TBD ? ?CHA2DS2-VASc Score = 5  ?This indicates a 7.2% annual risk of stroke. ?The patient's score is based upon: ?CHF History: 0 ?HTN History: 1 ?Diabetes History: 0 ?Stroke History: 0 ?Vascular Disease History: 1 ?Age Score: 2 ?Gender Score: 1 ?  ?CrCl 10mL/min using adjusted body weight due to obesity ?Platelet count 214K ? ?Per office protocol, patient can hold Xarelto for 1-2 days prior to procedure.   ?

## 2021-05-29 NOTE — Telephone Encounter (Signed)
? ?  Primary Cardiologist: Rozann Lesches, MD ? ?Chart reviewed as part of pre-operative protocol coverage. Given past medical history and time since last visit, based on ACC/AHA guidelines, RADIANCE DEADY would be at acceptable risk for the planned procedure without further cardiovascular testing.  ? ?Patient with diagnosis of afib on Xarelto for anticoagulation.   ?  ?Procedure: cheek biopsy ?Date of procedure: TBD ?  ?CHA2DS2-VASc Score = 5  ?This indicates a 7.2% annual risk of stroke. ?The patient's score is based upon: ?CHF History: 0 ?HTN History: 1 ?Diabetes History: 0 ?Stroke History: 0 ?Vascular Disease History: 1 ?Age Score: 2 ?Gender Score: 1 ?  ?CrCl 63mL/min using adjusted body weight due to obesity ?Platelet count 214K ?  ?Per office protocol, patient can hold Xarelto for 1-2 days prior to procedure. ? ?I will route this recommendation to the requesting party via Epic fax function and remove from pre-op pool. ? ?Please call with questions. ? ?Jossie Ng. Leonard Hendler NP-C ? ?  ?05/29/2021, 3:55 PM ?[place] ?[address] ?Office [phone] Fax [phone] ? ? ? ? ?

## 2021-06-04 ENCOUNTER — Ambulatory Visit (INDEPENDENT_AMBULATORY_CARE_PROVIDER_SITE_OTHER): Payer: Medicare Other | Admitting: *Deleted

## 2021-06-04 ENCOUNTER — Other Ambulatory Visit: Payer: Self-pay

## 2021-06-04 VITALS — BP 150/53 | HR 58 | Temp 97.7°F | Resp 19 | Ht 62.0 in | Wt 197.0 lb

## 2021-06-04 DIAGNOSIS — J4520 Mild intermittent asthma, uncomplicated: Secondary | ICD-10-CM

## 2021-06-04 MED ORDER — FAMOTIDINE IN NACL 20-0.9 MG/50ML-% IV SOLN: 20.0000 mg | Freq: Once | INTRAVENOUS | Status: DC | PRN

## 2021-06-04 MED ORDER — SODIUM CHLORIDE 0.9 % IV SOLN: Freq: Once | INTRAVENOUS | Status: DC | PRN

## 2021-06-04 MED ORDER — METHYLPREDNISOLONE SODIUM SUCC 125 MG IJ SOLR: 125.0000 mg | Freq: Once | INTRAMUSCULAR | Status: DC | PRN

## 2021-06-04 MED ORDER — EPINEPHRINE 0.3 MG/0.3ML IJ SOAJ: 0.3000 mg | Freq: Once | INTRAMUSCULAR | Status: DC | PRN

## 2021-06-04 MED ORDER — ALBUTEROL SULFATE HFA 108 (90 BASE) MCG/ACT IN AERS: 2.0000 | INHALATION_SPRAY | Freq: Once | RESPIRATORY_TRACT | Status: DC | PRN

## 2021-06-04 MED ORDER — MEPOLIZUMAB 100 MG ~~LOC~~ SOLR
100.0000 mg | Freq: Once | SUBCUTANEOUS | Status: AC
  Administered 2021-06-04: 100 mg via SUBCUTANEOUS
  Filled 2021-06-04: qty 1

## 2021-06-04 MED ORDER — DIPHENHYDRAMINE HCL 50 MG/ML IJ SOLN: 50.0000 mg | Freq: Once | INTRAMUSCULAR | Status: DC | PRN

## 2021-06-04 NOTE — Progress Notes (Signed)
Diagnosis: Asthma ? ?Provider:  Marshell Garfinkel, MD ? ?Procedure: Injection ? ?Nucala (Mepolizumab), Dose: 100 mg, Site: subcutaneous, Number of injections: 1 ? ?Discharge: Condition: Good, Destination: Home . AVS provided to patient.  ? ?Performed by:  Ludwig Lean, RN  ? ? ? ?  ?

## 2021-06-07 ENCOUNTER — Other Ambulatory Visit: Payer: Self-pay | Admitting: Internal Medicine

## 2021-06-09 NOTE — Telephone Encounter (Signed)
This is a A-Fib clinic pt 

## 2021-06-12 ENCOUNTER — Telehealth: Payer: Self-pay | Admitting: Internal Medicine

## 2021-06-13 NOTE — Telephone Encounter (Signed)
Will go check Dr Bertrum Sol box.  ?

## 2021-06-13 NOTE — Telephone Encounter (Signed)
Patient checking on patient assistance forms. Patient phone number is [phone]. ?

## 2021-06-13 NOTE — Telephone Encounter (Signed)
Called and spoke with patient to let her know that we received her patient assistance paperwork and that I will give it to Dr. Annamaria Boots ?

## 2021-06-16 ENCOUNTER — Ambulatory Visit (INDEPENDENT_AMBULATORY_CARE_PROVIDER_SITE_OTHER): Payer: Medicare Other

## 2021-06-16 DIAGNOSIS — I495 Sick sinus syndrome: Secondary | ICD-10-CM

## 2021-06-16 LAB — CUP PACEART REMOTE DEVICE CHECK
Date Time Interrogation Session: 20230319231704
Implantable Pulse Generator Implant Date: 20220826

## 2021-06-16 MED ORDER — UMECLIDINIUM-VILANTEROL 62.5-25 MCG/ACT IN AEPB: INHALATION_SPRAY | RESPIRATORY_TRACT | 11 refills | Status: DC

## 2021-06-16 NOTE — Telephone Encounter (Signed)
RX has been printed and signed by Dr. Annamaria Boots. Will fax over to [facility] and place in scan folder. Patient has been notified. Nothing further needed at this time.  ?

## 2021-06-24 ENCOUNTER — Other Ambulatory Visit: Payer: Self-pay

## 2021-06-24 ENCOUNTER — Ambulatory Visit (INDEPENDENT_AMBULATORY_CARE_PROVIDER_SITE_OTHER): Payer: Medicare Other | Admitting: Gastroenterology

## 2021-06-24 ENCOUNTER — Encounter (INDEPENDENT_AMBULATORY_CARE_PROVIDER_SITE_OTHER): Payer: Self-pay | Admitting: Gastroenterology

## 2021-06-24 VITALS — BP 134/67 | HR 58 | Temp 98.1°F | Ht 62.5 in | Wt 200.3 lb

## 2021-06-24 DIAGNOSIS — R0989 Other specified symptoms and signs involving the circulatory and respiratory systems: Secondary | ICD-10-CM | POA: Diagnosis not present

## 2021-06-24 DIAGNOSIS — K219 Gastro-esophageal reflux disease without esophagitis: Secondary | ICD-10-CM

## 2021-06-24 DIAGNOSIS — R059 Cough, unspecified: Secondary | ICD-10-CM | POA: Insufficient documentation

## 2021-06-24 DIAGNOSIS — R058 Other specified cough: Secondary | ICD-10-CM | POA: Diagnosis not present

## 2021-06-24 MED ORDER — OMEPRAZOLE 40 MG PO CPDR: 40.0000 mg | DELAYED_RELEASE_CAPSULE | Freq: Every day | ORAL | 2 refills | Status: DC

## 2021-06-24 NOTE — Patient Instructions (Signed)
Please stop protonix ?I have sent omeprazole '40mg'$  to your pharmacy, I was unable to see the cost, if it ends up being too expensive, let me know and I can switch it to [facility] to use [facility] with. Please take this medication in the morning about 30 minutes prior to eating. ?You can use famotidine in the evenings as needed. ?Be sure you are avoiding greasy, fried, fatty, tomato based, spicy foods, caffeine, chocolate and carbonated drinks as these can worsen reflux symptoms ?Make sure you are staying upright 2-3 hours after eating, prior to lying down. ? ?Please let me know if symptoms are not improved with change in medication ? ?Follow up 1 year ? ?

## 2021-06-24 NOTE — Progress Notes (Signed)
Carelink Summary Report / Loop Recorder 

## 2021-06-24 NOTE — Progress Notes (Signed)
? ?Referring Provider: Monico Blitz, MD ?Primary Care Physician:  Monico Blitz, MD ?Primary GI Physician: Laural Golden ? ?Chief Complaint  ?Patient presents with  ? Follow-up  ?  Patient here today for a follow up. She is having issues with GERD more lately. She states at times after eating she coughs up phlegm. On Pantoprazole 40 mg once per day and famotidine prn Q[facility].  ? ?HPI:   ?[name] [name] is a 81 y.o. female with past medical history of anxiety, asthma, A flutter, COPD, depression, HTN, HLD, OSA.  ? ?Patient presenting today for follow up of GERD. ? ?Last seen 05/07/20, GERD well controlled with once daily protonix '40mg'$  with occasional use of tums. Not using famotidine at that time.  ? ?Today, she states she is still taking pantoprazole '40mg'$  once daily and famotidine '20mg'$  QHS PRN, states that recently she has noticed a lot of coughing after eating with some thick phlegm she coughs up. Unsure if this is related to allergies or her reflux. She states that she has not had much issue with heartburn for the past few weeks but has had some acid regurgitation. She does endorse a sore throat at times though she does have a lot of sinus drainage. Denies any issues with dysphagia or odynophagia. She states that she is using tums almost after every meal which seems to help with her symptoms. She is on budesonide every morning, she is using Flonase and a sinus rinse. She does report that she is on Tikosyn so she is unable to take a lot of medications due to potential interactions with Tikosyn. ? ?She denies abdominal pain, nausea, vomiting, early satiety, unintentional weight loss, rectal bleeding or melena. Denies constipation or diarrhea. She states bowels move pretty regularly with usually 1 BM per day.  ? ?Last Colonoscopy:06/09/12 Examination performed to cecum. ?Few small diverticula at sigmoid colon. ?Small external hemorrhoids. ? Last Endoscopy:08/27/16- Multiple whitish plaques in the upper third of the esophagus.  Brushing obtained for KOH. ?- Normal middle third of esophagus, lower third of esophagus and gastroesophageal ?junction. ?- Z-line regular, 39 cm from the incisors. ?- 2 cm hiatal hernia. ?- Multiple gastric polyps. Four biopsied. ?- Normal duodenal bulb and second portion of the duodenum. ? ?Recommendations:  ?Repeat colonoscopy in March 2024 if health permits ? ?Past Medical History:  ?Diagnosis Date  ? Allergic rhinitis   ? Anxiety   ? Asthma   ? Atrial flutter ([place])   ? COPD (chronic obstructive pulmonary disease) ([place])   ? Depression   ? Essential hypertension   ? Hyperlipidemia   ? Lumbar disc disease   ? Obesity   ? OSA (obstructive sleep apnea)   ? CPAP  ? Paroxysmal atrial fibrillation ([facility])   ? Element of tachycardia bradycardia syndrome  ? Respiratory failure ([place])   ? ? ?Past Surgical History:  ?Procedure Laterality Date  ? ANTERIOR FUSION LUMBAR SPINE    ? ATRIAL FIBRILLATION ABLATION N/A 09/12/2020  ? Procedure: ATRIAL FIBRILLATION ABLATION;  Surgeon: Thompson Grayer, MD;  Location: [facility];  Service: Cardiovascular;  Laterality: N/A;  ? BIOPSY  08/27/2016  ? Procedure: BIOPSY;  Surgeon: Rogene Houston, MD;  [facility];  Service: Endoscopy;;  FOUR GASTRIC POLYPS BIOPSIED  ? CARDIOVERSION N/A 03/28/2014  ? Procedure: CARDIOVERSION;  Surgeon: Fay Records, MD;  Location: [facility];  Service: Cardiovascular;  Laterality: N/A;  ? CARDIOVERSION N/A 10/22/2016  ? Procedure: CARDIOVERSION;  Surgeon: Sueanne Margarita, MD;  Location: [facility];  Service: Cardiovascular;  Laterality: N/A;  ? CARDIOVERSION N/A 11/26/2020  ? Procedure: CARDIOVERSION;  Surgeon: Fay Records, MD;  Location: [facility];  Service: Cardiovascular;  Laterality: N/A;  ? CATARACT EXTRACTION    ? COLONOSCOPY N/A 08/04/2012  ? Procedure: COLONOSCOPY;  Surgeon: Rogene Houston, MD;  [facility];  Service: Endoscopy;  Laterality: N/A;  730-rescheduled to [place] notified pt  ? ELECTROPHYSIOLOGIC STUDY N/A  09/18/2014  ? Procedure: Atrial Fibrillation Ablation;  Surgeon: Thompson Grayer, MD;  Location: [facility];  Service: Cardiovascular;  Laterality: N/A;  ? ESOPHAGOGASTRODUODENOSCOPY N/A 08/27/2016  ? Procedure: ESOPHAGOGASTRODUODENOSCOPY (EGD);  Surgeon: Rogene Houston, MD;  [facility];  Service: Endoscopy;  Laterality: N/A;  1200  ? implantable loop recorder placement  11/22/2020  ? Medtronic Reveal Linq model LNQ 11 (SN [id number] [id number]) implantable loop recorder  ? LASIK    ? Both eyes  ? TEE WITHOUT CARDIOVERSION N/A 09/18/2014  ? Procedure: TRANSESOPHAGEAL ECHOCARDIOGRAM (TEE);  Surgeon: Larey Dresser, MD;  Location: [facility];  Service: Cardiovascular;  Laterality: N/A;  ? TEE WITHOUT CARDIOVERSION N/A 10/22/2016  ? Procedure: TRANSESOPHAGEAL ECHOCARDIOGRAM (TEE);  Surgeon: Sueanne Margarita, MD;  Location: [facility];  Service: Cardiovascular;  Laterality: N/A;  ? TEE WITHOUT CARDIOVERSION N/A 08/24/2017  ? Procedure: TRANSESOPHAGEAL ECHOCARDIOGRAM (TEE);  Surgeon: Josue Hector, MD;  Location: [facility];  Service: Cardiovascular;  Laterality: N/A;  ? TOTAL HIP ARTHROPLASTY  03/30/2008  ? Right  ? ? ?Current Outpatient Medications  ?Medication Sig Dispense Refill  ? acetaminophen (TYLENOL) 500 MG tablet Take 500 mg by mouth every 8 (eight) hours as needed for headache.    ? antiseptic oral rinse (BIOTENE) LIQD 15 mLs by Mouth Rinse route daily as needed for dry mouth.    ? atorvastatin (LIPITOR) 10 MG tablet Take 10 mg by mouth daily at 6 PM.    ? benzonatate (TESSALON) 200 MG capsule Take 1 capsule (200 mg total) by mouth 3 (three) times daily as needed for cough. 30 capsule 3  ? Biotin 1 MG CAPS Take 1,000 mg by mouth 3 (three) times a week.     ? budesonide (PULMICORT) 0.5 MG/2ML nebulizer solution Take 0.5 mg by nebulization daily.     ? Calcium Carbonate-Vit D-Min (QC CALCIUM-MAGNESIUM-ZINC-D3) 333.4-133 MG-UNIT TABS Take 1 tablet by mouth 2 (two) times daily.     ? cetirizine  (ZYRTEC) 10 MG tablet Take 10 mg by mouth at bedtime as needed for allergies.    ? clindamycin (CLEOCIN) 300 MG capsule Take 600 mg by mouth See admin instructions. Take prior to dental procedures  0  ? diltiazem (CARDIZEM CD) 120 MG 24 hr capsule Take 1 capsule (120 mg total) by mouth every evening.    ? diltiazem (CARDIZEM CD) 240 MG 24 hr capsule TAKE ONE CAPSULE BY MOUTH in THE morning 30 capsule 1  ? diltiazem (CARDIZEM) 30 MG tablet TAKE 1 TABLET BY MOUTH AS NEEDED FOR PALPITATIONS 45 tablet 2  ? dofetilide (TIKOSYN) 500 MCG capsule Take 1 capsule (500 mcg total) by mouth 2 (two) times daily. 180 capsule 1  ? EPINEPHrine 0.3 mg/0.3 mL IJ SOAJ injection Inject 0.3 mg into the muscle as needed for anaphylaxis.    ? famotidine (PEPCID) 20 MG tablet Take 2 tablets (40 mg total) by mouth at bedtime as needed. 30 tablet 1  ? fluorouracil (EFUDEX) 5 % cream Apply topically 2 (two) times daily. (Patient  not taking: Reported on 04/16/2021)    ? fluticasone (FLONASE) 50 MCG/ACT nasal spray Place 1 spray into both nostrils 2 (two) times daily.    ? hydrALAZINE (APRESOLINE) 100 MG tablet Take 100 mg by mouth 2 (two) times daily.    ? HYDROcodone bit-homatropine (HYCODAN) 5-1.5 MG/5ML syrup Take 5 mLs by mouth every 6 (six) hours as needed for cough.    ? ipratropium-albuterol (DUONEB) 0.5-2.5 (3) MG/3ML SOLN Take 3 mLs by nebulization 4 (four) times daily as needed (Shortness of breathing).    ? ketoconazole (NIZORAL) 2 % shampoo Apply 1 application topically 3 (three) times a week. (Patient not taking: Reported on 04/16/2021)    ? losartan (COZAAR) 100 MG tablet TAKE 1 TABLET BY MOUTH DAILY 90 tablet 3  ? Magnesium 250 MG TABS Take 250 mg by mouth daily.    ? montelukast (SINGULAIR) 10 MG tablet Take 10 mg by mouth at bedtime.    ? pantoprazole (PROTONIX) 40 MG tablet Take one tablet qam. NEEDS OFFICE VISIT 90 tablet 0  ? Polyethyl Glycol-Propyl Glycol (GENTEAL TEARS SEVERE DAY/NIGHT) 0.4-0.3 % GEL ophthalmic gel Place 1  application into both eyes in the morning and at bedtime.    ? potassium chloride SA (KLOR-CON M) 20 MEQ tablet TAKE 1 TABLET BY MOUTH DAILY 90 tablet 3  ? rivaroxaban (XARELTO) 20 MG TABS tablet Take 1

## 2021-06-25 ENCOUNTER — Ambulatory Visit (INDEPENDENT_AMBULATORY_CARE_PROVIDER_SITE_OTHER): Payer: Medicare Other | Admitting: Podiatry

## 2021-06-25 ENCOUNTER — Encounter: Payer: Self-pay | Admitting: Podiatry

## 2021-06-25 DIAGNOSIS — L84 Corns and callosities: Secondary | ICD-10-CM

## 2021-06-25 DIAGNOSIS — M79675 Pain in left toe(s): Secondary | ICD-10-CM | POA: Diagnosis not present

## 2021-06-25 DIAGNOSIS — M79674 Pain in right toe(s): Secondary | ICD-10-CM

## 2021-06-25 DIAGNOSIS — Z299 Encounter for prophylactic measures, unspecified: Secondary | ICD-10-CM | POA: Diagnosis not present

## 2021-06-25 DIAGNOSIS — I10 Essential (primary) hypertension: Secondary | ICD-10-CM | POA: Diagnosis not present

## 2021-06-25 DIAGNOSIS — L02419 Cutaneous abscess of limb, unspecified: Secondary | ICD-10-CM | POA: Diagnosis not present

## 2021-06-25 DIAGNOSIS — Z6836 Body mass index (BMI) 36.0-36.9, adult: Secondary | ICD-10-CM | POA: Diagnosis not present

## 2021-06-25 DIAGNOSIS — M792 Neuralgia and neuritis, unspecified: Secondary | ICD-10-CM | POA: Diagnosis not present

## 2021-06-25 DIAGNOSIS — B351 Tinea unguium: Secondary | ICD-10-CM

## 2021-06-25 DIAGNOSIS — L03119 Cellulitis of unspecified part of limb: Secondary | ICD-10-CM | POA: Diagnosis not present

## 2021-06-26 DIAGNOSIS — I10 Essential (primary) hypertension: Secondary | ICD-10-CM | POA: Diagnosis not present

## 2021-06-29 NOTE — Progress Notes (Signed)
?  Subjective:  ?Patient ID: [name] [name], female    DOB: 02-17-1941,  MRN: [id number] ? ?[name] [name] presents to clinic today for painful elongated mycotic toenails 1-5 bilaterally which are tender when wearing enclosed shoe gear. Pain is relieved with periodic professional debridement. ? ?New problem(s):  Patient states she has pain in her toes and it is worse at night. Pain is described as sharp, sometimes pins/needles and burning. She has attempted no treatment for this . She does have h/o lumbar disc disease ? ?PCP is Monico Blitz, MD , and last visit was March 28, 2021. ? ?Allergies  ?Allergen Reactions  ? Bupropion Hcl Other (See Comments)  ?  Suicidal Thoughts.   ? Escitalopram Oxalate Other (See Comments)  ?  Suicidal Thoughts.   ? Fluticasone-Salmeterol Other (See Comments)  ?  Advair - Caused patient to go into Afib.   ? Lisinopril Cough  ? Serevent Other (See Comments)  ?  Caused patient to go into Afib  ? ? ?Review of Systems: Negative except as noted in the HPI. ? ?Objective: No changes noted in today's physical examination. ?Constitutional [name] [name] is a pleasant 81 y.o. Caucasian female, obese in NAD. AAO x 3.   ?Vascular CFT immediate b/l LE. Palpable DP/PT pulses b/l LE. Digital hair sparse b/l. Skin temperature gradient WNL b/l. No pain with calf compression b/l. Trace edema b/l LE. No cyanosis or clubbing noted b/l LE.  ?Neurologic Normal speech. Oriented to person, place, and time. Pt has subjective symptoms of neuropathy. Protective sensation intact 5/5 intact bilaterally with 10g monofilament b/l.  ?Dermatologic Pedal integument with normal turgor, texture and tone b/l LE. No open wounds b/l. No interdigital macerations b/l. Toenails 1-5 b/l elongated, thickened, discolored with subungual debris. +Tenderness with dorsal palpation of nailplates. Hyperkeratotic lesion(s) submet head 1 b/l.  No erythema, no edema, no drainage, no fluctuance.   ?Orthopedic: Normal muscle  strength 5/5 to all lower extremity muscle groups bilaterally. 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 Pes planus deformity noted bilateral LE.Marland Kitchen No pain, crepitus or joint limitation noted with ROM b/l LE.  Patient ambulates independently without assistive aids.  ? ?Radiographs: None ?Assessment/Plan: ?1. Pain due to onychomycosis of toenails of both feet   ?2. Callus   ?3. Neuropathic pain   ?-Patient was evaluated and treated. All patient's and/or POA's questions/concerns answered on today's visit. ?-Discussed her toe pain. Instructed her to apply Aspercreme with pain relief to toes before bed time. We will revisit her symptoms at her next scheduled visit.. ?-Toenails 1-5 b/l were debrided in length and girth with sterile nail nippers and dremel without iatrogenic bleeding.  ?-Callus(es) submet head 1 b/l pared utilizing sterile scalpel blade without complication or incident. Total number debrided =2. ?-Patient/POA to call should there be question/concern in the interim.  ? ?Return in about 9 weeks (around 08/27/2021). ? ?Marzetta Board, DPM  ?

## 2021-07-02 ENCOUNTER — Ambulatory Visit (INDEPENDENT_AMBULATORY_CARE_PROVIDER_SITE_OTHER): Payer: Medicare Other

## 2021-07-02 VITALS — BP 122/53 | HR 84 | Temp 97.8°F | Resp 18 | Ht 61.0 in | Wt 195.8 lb

## 2021-07-02 DIAGNOSIS — J4520 Mild intermittent asthma, uncomplicated: Secondary | ICD-10-CM | POA: Diagnosis not present

## 2021-07-02 MED ORDER — MEPOLIZUMAB 100 MG ~~LOC~~ SOLR
100.0000 mg | Freq: Once | SUBCUTANEOUS | Status: AC
  Administered 2021-07-02: 100 mg via SUBCUTANEOUS
  Filled 2021-07-02: qty 1

## 2021-07-02 NOTE — Progress Notes (Signed)
Diagnosis: Asthma ? ?Provider:  Praveen Mannam, MD ? ?Procedure: Injection ? ?Nucala (Mepolizumab), Dose: 100 mg, Site: subcutaneous, Number of injections: 1 ? ?Discharge: Condition: Good, Destination: Home . AVS provided to patient.  ? ?Performed by:  Anthonella Klausner, RN  ? ? ? ?  ?

## 2021-07-16 DIAGNOSIS — M9902 Segmental and somatic dysfunction of thoracic region: Secondary | ICD-10-CM | POA: Diagnosis not present

## 2021-07-16 DIAGNOSIS — M79672 Pain in left foot: Secondary | ICD-10-CM | POA: Diagnosis not present

## 2021-07-16 DIAGNOSIS — M79671 Pain in right foot: Secondary | ICD-10-CM | POA: Diagnosis not present

## 2021-07-16 DIAGNOSIS — M542 Cervicalgia: Secondary | ICD-10-CM | POA: Diagnosis not present

## 2021-07-16 DIAGNOSIS — M546 Pain in thoracic spine: Secondary | ICD-10-CM | POA: Diagnosis not present

## 2021-07-16 DIAGNOSIS — M48061 Spinal stenosis, lumbar region without neurogenic claudication: Secondary | ICD-10-CM | POA: Diagnosis not present

## 2021-07-16 DIAGNOSIS — M9901 Segmental and somatic dysfunction of cervical region: Secondary | ICD-10-CM | POA: Diagnosis not present

## 2021-07-16 DIAGNOSIS — M9905 Segmental and somatic dysfunction of pelvic region: Secondary | ICD-10-CM | POA: Diagnosis not present

## 2021-07-16 DIAGNOSIS — M9903 Segmental and somatic dysfunction of lumbar region: Secondary | ICD-10-CM | POA: Diagnosis not present

## 2021-07-21 ENCOUNTER — Ambulatory Visit (INDEPENDENT_AMBULATORY_CARE_PROVIDER_SITE_OTHER): Payer: Medicare Other

## 2021-07-21 DIAGNOSIS — I495 Sick sinus syndrome: Secondary | ICD-10-CM | POA: Diagnosis not present

## 2021-07-22 DIAGNOSIS — L538 Other specified erythematous conditions: Secondary | ICD-10-CM | POA: Diagnosis not present

## 2021-07-22 DIAGNOSIS — Z789 Other specified health status: Secondary | ICD-10-CM | POA: Diagnosis not present

## 2021-07-22 DIAGNOSIS — R208 Other disturbances of skin sensation: Secondary | ICD-10-CM | POA: Diagnosis not present

## 2021-07-22 DIAGNOSIS — L918 Other hypertrophic disorders of the skin: Secondary | ICD-10-CM | POA: Diagnosis not present

## 2021-07-22 DIAGNOSIS — L570 Actinic keratosis: Secondary | ICD-10-CM | POA: Diagnosis not present

## 2021-07-22 DIAGNOSIS — L820 Inflamed seborrheic keratosis: Secondary | ICD-10-CM | POA: Diagnosis not present

## 2021-07-22 DIAGNOSIS — L298 Other pruritus: Secondary | ICD-10-CM | POA: Diagnosis not present

## 2021-07-22 DIAGNOSIS — R58 Hemorrhage, not elsewhere classified: Secondary | ICD-10-CM | POA: Diagnosis not present

## 2021-07-22 LAB — CUP PACEART REMOTE DEVICE CHECK
Date Time Interrogation Session: 20230421231645
Implantable Pulse Generator Implant Date: 20220826

## 2021-07-24 DIAGNOSIS — Z1339 Encounter for screening examination for other mental health and behavioral disorders: Secondary | ICD-10-CM | POA: Diagnosis not present

## 2021-07-24 DIAGNOSIS — E559 Vitamin D deficiency, unspecified: Secondary | ICD-10-CM | POA: Diagnosis not present

## 2021-07-24 DIAGNOSIS — Z6836 Body mass index (BMI) 36.0-36.9, adult: Secondary | ICD-10-CM | POA: Diagnosis not present

## 2021-07-24 DIAGNOSIS — E7800 Pure hypercholesterolemia, unspecified: Secondary | ICD-10-CM | POA: Diagnosis not present

## 2021-07-24 DIAGNOSIS — D692 Other nonthrombocytopenic purpura: Secondary | ICD-10-CM | POA: Diagnosis not present

## 2021-07-24 DIAGNOSIS — R5383 Other fatigue: Secondary | ICD-10-CM | POA: Diagnosis not present

## 2021-07-24 DIAGNOSIS — Z0000 Encounter for general adult medical examination without abnormal findings: Secondary | ICD-10-CM | POA: Diagnosis not present

## 2021-07-24 DIAGNOSIS — Z299 Encounter for prophylactic measures, unspecified: Secondary | ICD-10-CM | POA: Diagnosis not present

## 2021-07-24 DIAGNOSIS — Z79899 Other long term (current) drug therapy: Secondary | ICD-10-CM | POA: Diagnosis not present

## 2021-07-24 DIAGNOSIS — Z1331 Encounter for screening for depression: Secondary | ICD-10-CM | POA: Diagnosis not present

## 2021-07-24 DIAGNOSIS — Z7189 Other specified counseling: Secondary | ICD-10-CM | POA: Diagnosis not present

## 2021-07-24 DIAGNOSIS — Z789 Other specified health status: Secondary | ICD-10-CM | POA: Diagnosis not present

## 2021-07-24 DIAGNOSIS — I10 Essential (primary) hypertension: Secondary | ICD-10-CM | POA: Diagnosis not present

## 2021-07-27 DIAGNOSIS — I10 Essential (primary) hypertension: Secondary | ICD-10-CM | POA: Diagnosis not present

## 2021-07-28 DIAGNOSIS — M79671 Pain in right foot: Secondary | ICD-10-CM | POA: Diagnosis not present

## 2021-07-28 DIAGNOSIS — M79672 Pain in left foot: Secondary | ICD-10-CM | POA: Diagnosis not present

## 2021-07-28 DIAGNOSIS — M48061 Spinal stenosis, lumbar region without neurogenic claudication: Secondary | ICD-10-CM | POA: Diagnosis not present

## 2021-07-28 DIAGNOSIS — M9905 Segmental and somatic dysfunction of pelvic region: Secondary | ICD-10-CM | POA: Diagnosis not present

## 2021-07-28 DIAGNOSIS — M542 Cervicalgia: Secondary | ICD-10-CM | POA: Diagnosis not present

## 2021-07-28 DIAGNOSIS — M9903 Segmental and somatic dysfunction of lumbar region: Secondary | ICD-10-CM | POA: Diagnosis not present

## 2021-07-28 DIAGNOSIS — M9901 Segmental and somatic dysfunction of cervical region: Secondary | ICD-10-CM | POA: Diagnosis not present

## 2021-07-28 DIAGNOSIS — M9902 Segmental and somatic dysfunction of thoracic region: Secondary | ICD-10-CM | POA: Diagnosis not present

## 2021-07-28 DIAGNOSIS — M546 Pain in thoracic spine: Secondary | ICD-10-CM | POA: Diagnosis not present

## 2021-07-29 DIAGNOSIS — M8580 Other specified disorders of bone density and structure, unspecified site: Secondary | ICD-10-CM | POA: Diagnosis not present

## 2021-07-30 ENCOUNTER — Ambulatory Visit (INDEPENDENT_AMBULATORY_CARE_PROVIDER_SITE_OTHER): Payer: Medicare Other

## 2021-07-30 ENCOUNTER — Other Ambulatory Visit: Payer: Self-pay | Admitting: *Deleted

## 2021-07-30 ENCOUNTER — Telehealth: Payer: Self-pay | Admitting: Cardiology

## 2021-07-30 VITALS — BP 143/72 | HR 68 | Temp 97.8°F | Resp 20 | Ht 62.0 in | Wt 199.6 lb

## 2021-07-30 DIAGNOSIS — J4520 Mild intermittent asthma, uncomplicated: Secondary | ICD-10-CM | POA: Diagnosis not present

## 2021-07-30 MED ORDER — RIVAROXABAN 20 MG PO TABS: 20.0000 mg | ORAL_TABLET | Freq: Every day | ORAL | 3 refills | Status: DC

## 2021-07-30 MED ORDER — MEPOLIZUMAB 100 MG ~~LOC~~ SOLR
100.0000 mg | Freq: Once | SUBCUTANEOUS | Status: AC
  Administered 2021-07-30: 100 mg via SUBCUTANEOUS
  Filled 2021-07-30: qty 1

## 2021-07-30 NOTE — Telephone Encounter (Signed)
Advised that [employer] assistance application available for xarelto. Advised to fill out all patient sections and bring back to office with proof of income. Verbalized understanding.  ?

## 2021-07-30 NOTE — Progress Notes (Signed)
Diagnosis: Asthma ? ?Provider:  Praveen Mannam, MD ? ?Procedure: Injection ? ?Nucala (Mepolizumab), Dose: 100 mg, Site: subcutaneous, Number of injections: 1 ? ?Discharge: Condition: Good, Destination: Home . AVS provided to patient.  ? ?Performed by:  Jaidy Cottam, RN  ? ? ? ?  ?

## 2021-07-30 NOTE — Telephone Encounter (Signed)
Pt would like a call back regarding pt assistance. Please advise ?

## 2021-07-31 ENCOUNTER — Encounter: Payer: Self-pay | Admitting: *Deleted

## 2021-07-31 NOTE — Progress Notes (Signed)
Fax notification received that patient has been enrolled. Xarelto approved through [employer] Patient Assistance Offering for 1 year.  ?

## 2021-08-04 DIAGNOSIS — I10 Essential (primary) hypertension: Secondary | ICD-10-CM | POA: Diagnosis not present

## 2021-08-04 DIAGNOSIS — Z299 Encounter for prophylactic measures, unspecified: Secondary | ICD-10-CM | POA: Diagnosis not present

## 2021-08-04 DIAGNOSIS — J019 Acute sinusitis, unspecified: Secondary | ICD-10-CM | POA: Diagnosis not present

## 2021-08-05 NOTE — Progress Notes (Signed)
Carelink Summary Report / Loop Recorder 

## 2021-08-06 NOTE — Progress Notes (Signed)
HPI female never smoker followed for OSA, allergic rhinitis, asthma, Hyper IgE,  complicated by A. Fib/Coumadin, HBP, GERD, obesity NPSG prior to EMR in 2012 Office Spirometry 09/02/16-WNL. FVC 2.06/76%, FEV1 1.57/77%, ratio 0.76, FEF 25-75% 1.26/80% Allergy labs 09/02/16- eosinophils 200, total IgE 4230 causing elevation of all specific allergen antibody levels including Aspergillus. Nucala  + 2018 PFT-10/13/16-minimal obstruction without response to dilator, minimal reduction of diffusion. FVC 2.02/77%, FEV1 1.62/83%, ratio 0.80, TLC 92%, DLCO 77% CT soft tissue neck 09/22/16-1. 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  Subsegmental atelectasis and nonspecific linear patchy densities in the lungs as described. They have a benign appearance.   Small hyperdense masses in the mediastinum are stable compared with 2015 supporting benign etiology such as ectopic thyroid tissue.  Three-vessel prominent coronary artery calcification.  Bibasilar bronchiolectasis. Labs 09/20/2017-WBC 14,900, hemoglobin 11.2, eosinophils absent, lymphocytes low( ?steroids or viral?), BNP 220, CMET wnl IgE 04/12/2018- 2,574, 10/16/16- 4,302, 11/09/13- 3,513 Office Spirometry 04/12/2018-mild restriction of exhaled volume.  Possible mild obstructive airways.  FVC 1.9/73%, FEV1 1.4/75%, ratio 1.76, FEF 25-75% 1.2/80%  -------------------------------------------------------------------------------------------   08/07/20- 81 year old female never smoker followed for OSA, allergic rhinitis, asthma/ Nucala, Hyper IgE, complicated by PA.Fib/Coumadin, HBP, GERD, obesity -Neb Pulmicort/  Duoneb   Anoro, Flonase, Zyrtec, Singulair,  CPAP 9/Adapt Download- compliance 97%, AHI 3/ hr Body weight today- 196 lbs Covid vax- 4 Moderna Pending Cardiac ablation for PAFib. Today is NSR. -----Patient feels good overall, she states she feels like her breathing may be a little worse  since last visit but she is starting to get out more and walk more and with the pollen her allergies have been acting up and she thinks that is why. Sleeping good, machine working good. ACT score  16 Avoids Duoneb nebulizer unless really tight- causes tremor. Daily productive cough on first waking- white. Trelegy caused thrush. Gets patient assistance for Anoro. We discussed trying Judithann Sauger- could use a spacer if needed. No major exacerbations and overall much better than she was a year or two ago.    08/07/21-81 year old female never smoker followed for OSA, allergic rhinitis, asthma/ Nucala, Hyper IgE, complicated by PA.FibXarelto, HBP, GERD, obesity -Neb Pulmicort/  Duoneb   Anoro, Flonase, Zyrtec, Singulair,  CPAP 9/Adapt Download- compliance 100%, AHI 2.7/ hr Body weight today- 198 lbs Covid vax- 5 Moderna Flu vax-had -----Patient would like to talk about respiratory therapy. Just started PT and thinks the additional will help. She is trying to get more active but had trouble after 3-4 minutes. Also feels congested on left side all the time.  Download reviewed. Discussed management of rhinitis, which may be vasomotor.  Suggested that with caution she could try using a little Afrin as she continues saline rinse and Flonase. Atrial fibrillation has been treated.  ROS-see HPI   + = positive Constitutional:   No-   weight loss, night sweats, fevers, chills, fatigue, lassitude. HEENT:   No-  headaches, difficulty swallowing, tooth/dental problems, sore throat,       No-  sneezing, itching,  ear ache,  +nasal congestion, post nasal drip,  CV:  No-   chest pain, orthopnea, PND, swelling in lower extremities, anasarca, dizziness, palpitations Resp: +  shortness of breath with exertion or at rest.                +productive cough,  + non-productive cough,  No- coughing up of blood.  No-change in color of mucus. + wheezing.   Skin: No-   rash or lesions. GI:  No-   heartburn,  indigestion, abdominal pain, nausea, vomiting, GU:  MS:  No-   joint pain or swelling. . Neuro-     nothing unusual Psych:  No- change in mood or affect. No depression or anxiety.  No memory loss.  OBJ- Physical Exam General- Alert, Oriented, Affect-appropriate, Distress- none acute, + obese Skin- rash-none, lesions- none, excoriation- none Lymphadenopathy- none Head- atraumatic            Eyes- Gross vision intact, PERRLA, conjunctivae and secretions clear            Ears- Hearing, canals-normal            Nose- No turbinate edema, no-Septal dev, mucus, polyps, erosion, perforation             Throat- Mallampati III-IV , mucosa- not red,  drainage- none, tonsils- atrophic Neck- flexible , trachea midline, no stridor , thyroid nl, carotid no bruit Chest - symmetrical excursion , unlabored           Heart/CV- RR/ very faint (no pacemaker) , no murmur , no gallop  , no rub, nl  s1 s2                  - JVD- none , edema- none, stasis changes- none, varices- none           Lung- + clear  Wheeze-none , cough-none,                         dullness-none, rub- none           Chest wall-  Abd- Br/ Gen/ Rectal- Not done, not indicated Extrem- cyanosis- none, clubbing, none, atrophy- none, strength- nl Neuro- grossly intact to observation

## 2021-08-07 ENCOUNTER — Ambulatory Visit (INDEPENDENT_AMBULATORY_CARE_PROVIDER_SITE_OTHER): Payer: Medicare Other | Admitting: Internal Medicine

## 2021-08-07 ENCOUNTER — Encounter: Payer: Self-pay | Admitting: Internal Medicine

## 2021-08-07 ENCOUNTER — Ambulatory Visit: Payer: Medicare Other | Admitting: Internal Medicine

## 2021-08-07 VITALS — BP 120/60 | HR 83 | Temp 97.6°F | Ht 61.5 in | Wt 198.4 lb

## 2021-08-07 DIAGNOSIS — I480 Paroxysmal atrial fibrillation: Secondary | ICD-10-CM

## 2021-08-07 DIAGNOSIS — J4520 Mild intermittent asthma, uncomplicated: Secondary | ICD-10-CM

## 2021-08-07 DIAGNOSIS — J4540 Moderate persistent asthma, uncomplicated: Secondary | ICD-10-CM

## 2021-08-07 DIAGNOSIS — G4733 Obstructive sleep apnea (adult) (pediatric): Secondary | ICD-10-CM | POA: Diagnosis not present

## 2021-08-07 DIAGNOSIS — R931 Abnormal findings on diagnostic imaging of heart and coronary circulation: Secondary | ICD-10-CM

## 2021-08-07 NOTE — Patient Instructions (Signed)
We can continue CPAP 9 ? ?Suggest occasional ( not every day) decongestant Afrin nasal spray. See if it helps with the stuffy nose.   ? ?Order- printed script for [facility]    dx Asthma moderate persistent ? ?Please call if we can help ?

## 2021-08-09 ENCOUNTER — Other Ambulatory Visit: Payer: Self-pay | Admitting: Nurse Practitioner

## 2021-08-11 ENCOUNTER — Telehealth: Payer: Self-pay | Admitting: Cardiology

## 2021-08-11 NOTE — Telephone Encounter (Signed)
Says she received 90 xarelto pill in the mail today ?

## 2021-08-11 NOTE — Telephone Encounter (Signed)
Patient calling because she has question bout the patient assistant for her medication, Please advise  ?

## 2021-08-18 ENCOUNTER — Other Ambulatory Visit (HOSPITAL_COMMUNITY): Payer: Self-pay

## 2021-08-18 MED ORDER — DILTIAZEM HCL ER COATED BEADS 240 MG PO CP24: 240.0000 mg | ORAL_CAPSULE | Freq: Every morning | ORAL | 1 refills | Status: DC

## 2021-08-21 LAB — CUP PACEART REMOTE DEVICE CHECK
Date Time Interrogation Session: 20230524235754
Implantable Pulse Generator Implant Date: 20220826

## 2021-08-26 ENCOUNTER — Ambulatory Visit (INDEPENDENT_AMBULATORY_CARE_PROVIDER_SITE_OTHER): Payer: Medicare Other

## 2021-08-26 ENCOUNTER — Other Ambulatory Visit (HOSPITAL_COMMUNITY): Payer: Self-pay | Admitting: Nurse Practitioner

## 2021-08-26 DIAGNOSIS — I10 Essential (primary) hypertension: Secondary | ICD-10-CM | POA: Diagnosis not present

## 2021-08-26 DIAGNOSIS — I495 Sick sinus syndrome: Secondary | ICD-10-CM | POA: Diagnosis not present

## 2021-08-26 NOTE — Telephone Encounter (Signed)
Informed of upcoming appointment with [facility] on 09/05/2021 '@3'$ :30 pm Verbalized understanding

## 2021-08-27 ENCOUNTER — Ambulatory Visit (INDEPENDENT_AMBULATORY_CARE_PROVIDER_SITE_OTHER): Payer: Medicare Other

## 2021-08-27 VITALS — BP 138/48 | HR 54 | Temp 98.0°F | Resp 16 | Wt 198.8 lb

## 2021-08-27 DIAGNOSIS — J4520 Mild intermittent asthma, uncomplicated: Secondary | ICD-10-CM | POA: Diagnosis not present

## 2021-08-27 MED ORDER — MEPOLIZUMAB 100 MG ~~LOC~~ SOLR
100.0000 mg | Freq: Once | SUBCUTANEOUS | Status: AC
  Administered 2021-08-27: 100 mg via SUBCUTANEOUS
  Filled 2021-08-27: qty 1

## 2021-08-27 NOTE — Progress Notes (Signed)
Diagnosis: Asthma  Provider:  Praveen Mannam, MD  Procedure: Injection  Nucala (Mepolizumab), Dose: 100 mg, Site: subcutaneous, Number of injections: 1  Discharge: Condition: Good, Destination: Home . AVS provided to patient.   Performed by:  Juno Alers, RN       

## 2021-08-28 DIAGNOSIS — R509 Fever, unspecified: Secondary | ICD-10-CM | POA: Diagnosis not present

## 2021-08-28 DIAGNOSIS — I4891 Unspecified atrial fibrillation: Secondary | ICD-10-CM | POA: Diagnosis not present

## 2021-08-28 DIAGNOSIS — J441 Chronic obstructive pulmonary disease with (acute) exacerbation: Secondary | ICD-10-CM | POA: Diagnosis not present

## 2021-08-28 DIAGNOSIS — Z6836 Body mass index (BMI) 36.0-36.9, adult: Secondary | ICD-10-CM | POA: Diagnosis not present

## 2021-08-28 DIAGNOSIS — Z299 Encounter for prophylactic measures, unspecified: Secondary | ICD-10-CM | POA: Diagnosis not present

## 2021-08-28 DIAGNOSIS — I10 Essential (primary) hypertension: Secondary | ICD-10-CM | POA: Diagnosis not present

## 2021-09-01 ENCOUNTER — Ambulatory Visit: Payer: Medicare Other | Admitting: Podiatry

## 2021-09-02 ENCOUNTER — Encounter (HOSPITAL_COMMUNITY)
Admission: RE | Admit: 2021-09-02 | Discharge: 2021-09-02 | Disposition: A | Discharge status: Home / Self Care | Payer: Medicare Other | Source: Ambulatory Visit | Attending: Internal Medicine | Admitting: Internal Medicine

## 2021-09-02 ENCOUNTER — Encounter (HOSPITAL_COMMUNITY): Payer: Self-pay

## 2021-09-02 VITALS — BP 148/62 | HR 59 | Ht 61.5 in | Wt 196.7 lb

## 2021-09-02 DIAGNOSIS — J4540 Moderate persistent asthma, uncomplicated: Secondary | ICD-10-CM | POA: Diagnosis not present

## 2021-09-02 NOTE — Progress Notes (Signed)
Pulmonary Individual Treatment Plan  Patient Details  Name: [name] [name] MRN: [id number] Date of Birth: 05-15-1940 Referring Provider:   Flowsheet Row PULMONARY REHAB OTHER RESP ORIENTATION from 09/02/2021 in [facility]  Referring Provider Dr. Annamaria Boots       Initial Encounter Date:  Flowsheet Row PULMONARY REHAB OTHER RESP ORIENTATION from 09/02/2021 in [place]  Date 09/02/21       Visit Diagnosis: Moderate persistent asthma, unspecified whether complicated  Patient's Home Medications on Admission:   Current Outpatient Medications:    acetaminophen (TYLENOL) 500 MG tablet, Take 500 mg by mouth every 8 (eight) hours as needed for headache., Disp: , Rfl:    antiseptic oral rinse (BIOTENE) LIQD, 15 mLs by Mouth Rinse route daily as needed for dry mouth., Disp: , Rfl:    atorvastatin (LIPITOR) 10 MG tablet, Take 10 mg by mouth daily at 6 PM., Disp: , Rfl:    benzonatate (TESSALON) 200 MG capsule, Take 1 capsule (200 mg total) by mouth 3 (three) times daily as needed for cough., Disp: 30 capsule, Rfl: 3   Biotin 1 MG CAPS, Take 1,000 mg by mouth 3 (three) times a week. , Disp: , Rfl:    budesonide (PULMICORT) 0.5 MG/2ML nebulizer solution, Take 0.5 mg by nebulization daily. Additional if needed during the day, Disp: , Rfl:    Calcium Carbonate-Vit D-Min (QC CALCIUM-MAGNESIUM-ZINC-D3) 333.4-133 MG-UNIT TABS, Take 1 tablet by mouth 2 (two) times daily. , Disp: , Rfl:    cetirizine (ZYRTEC) 10 MG tablet, Take 10 mg by mouth at bedtime as needed for allergies., Disp: , Rfl:    clindamycin (CLEOCIN) 300 MG capsule, Take 600 mg by mouth See admin instructions. Take prior to dental procedures, Disp: , Rfl: 0   diltiazem (CARDIZEM CD) 120 MG 24 hr capsule, Take 1 capsule (120 mg total) by mouth every evening., Disp: , Rfl:    diltiazem (CARDIZEM CD) 240 MG 24 hr capsule, Take 1 capsule (240 mg total) by mouth every morning., Disp: 90 capsule, Rfl:  1   diltiazem (CARDIZEM) 30 MG tablet, TAKE 1 TABLET BY MOUTH AS NEEDED FOR PALPITATIONS, Disp: 45 tablet, Rfl: 2   dofetilide (TIKOSYN) 500 MCG capsule, TAKE ONE CAPSULE BY MOUTH TWICE A DAY, Disp: 180 capsule, Rfl: 1   EPINEPHrine 0.3 mg/0.3 mL IJ SOAJ injection, Inject 0.3 mg into the muscle as needed for anaphylaxis., Disp: , Rfl:    famotidine (PEPCID) 20 MG tablet, Take 2 tablets (40 mg total) by mouth at bedtime as needed., Disp: 30 tablet, Rfl: 1   fluticasone (FLONASE) 50 MCG/ACT nasal spray, Place 1 spray into both nostrils 2 (two) times daily., Disp: , Rfl:    hydrALAZINE (APRESOLINE) 100 MG tablet, Take 100 mg by mouth 2 (two) times daily., Disp: , Rfl:    HYDROcodone bit-homatropine (HYCODAN) 5-1.5 MG/5ML syrup, Take 5 mLs by mouth every 6 (six) hours as needed for cough., Disp: , Rfl:    Hypertonic Nasal Wash (SINUS RINSE) PACK, Place 1 each into the nose daily as needed (clear nasal passage). Neilmed sinus rinse, Disp: , Rfl:    ipratropium-albuterol (DUONEB) 0.5-2.5 (3) MG/3ML SOLN, Take 3 mLs by nebulization 4 (four) times daily as needed (Shortness of breathing)., Disp: , Rfl:    losartan (COZAAR) 100 MG tablet, TAKE 1 TABLET BY MOUTH DAILY, Disp: 90 tablet, Rfl: 3   Magnesium 250 MG TABS, Take 250 mg by mouth daily., Disp: , Rfl:    montelukast (SINGULAIR)  10 MG tablet, Take 10 mg by mouth at bedtime., Disp: , Rfl:    omeprazole (PRILOSEC) 40 MG capsule, Take 1 capsule (40 mg total) by mouth daily., Disp: 60 capsule, Rfl: 2   Polyethyl Glycol-Propyl Glycol (SYSTANE) 0.4-0.3 % GEL ophthalmic gel, Place 1 application. into both eyes daily as needed (Dry eye)., Disp: , Rfl:    Polyethyl Glycol-Propyl Glycol (SYSTANE) 0.4-0.3 % SOLN, Place 1 drop into both eyes daily as needed (Dry eye)., Disp: , Rfl:    potassium chloride SA (KLOR-CON M) 20 MEQ tablet, TAKE 1 TABLET BY MOUTH DAILY, Disp: 90 tablet, Rfl: 3   rivaroxaban (XARELTO) 20 MG TABS tablet, Take 1 tablet (20 mg total) by mouth  daily with supper., Disp: 90 tablet, Rfl: 3   umeclidinium-vilanterol (ANORO ELLIPTA) 62.5-25 MCG/ACT AEPB, INHALE ONE PUFF INTO THE LUNGS DAILY (Patient taking differently: 1 puff every morning.), Disp: 60 each, Rfl: 11   Wheat Dextrin (BENEFIBER ON THE GO) POWD, Take 10 mLs by mouth daily., Disp: , Rfl:   Past Medical History: Past Medical History:  Diagnosis Date   Allergic rhinitis    Anxiety    Asthma    Atrial flutter (HCC)    COPD (chronic obstructive pulmonary disease) (HCC)    Depression    Essential hypertension    Hyperlipidemia    Lumbar disc disease    Obesity    OSA (obstructive sleep apnea)    CPAP   Paroxysmal atrial fibrillation (HCC)    Element of tachycardia bradycardia syndrome   Respiratory failure (HCC)     Tobacco Use: Social History   Tobacco Use  Smoking Status Never  Smokeless Tobacco Never    Labs: Review Flowsheet        Latest Ref Rng & Units 11/26/2020  Labs for ITP Cardiac and Pulmonary Rehab  TCO2 22 - 32 mmol/L 23            Capillary Blood Glucose: No results found for: GLUCAP   Pulmonary Assessment Scores:  Pulmonary Assessment Scores     Row Name 09/02/21 0824         ADL UCSD   ADL Phase Entry     SOB Score total 82     Rest 1     Walk 3     Stairs 5     Bath 4     Dress 3     Shop 3       CAT Score   CAT Score 15       mMRC Score   mMRC Score 3             UCSD: Self-administered rating of dyspnea associated with activities of daily living (ADLs) 6-point scale (0 = "not at all" to 5 = "maximal or unable to do because of breathlessness")  Scoring Scores range from 0 to 120.  Minimally important difference is 5 units  CAT: CAT can identify the health impairment of COPD patients and is better correlated with disease progression.  CAT has a scoring range of zero to 40. The CAT score is classified into four groups of low (less than 10), medium (10 - 20), high (21-30) and very high (31-40) based on the  impact level of disease on health status. A CAT score over 10 suggests significant symptoms.  A worsening CAT score could be explained by an exacerbation, poor medication adherence, poor inhaler technique, or progression of COPD or comorbid conditions.  CAT MCID is 2 points  mMRC: mMRC (Modified Medical Research Council) Dyspnea Scale is used to assess the degree of baseline functional disability in patients of respiratory disease due to dyspnea. No minimal important difference is established. A decrease in score of 1 point or greater is considered a positive change.   Pulmonary Function Assessment:   Exercise Target Goals: Exercise Program Goal: Individual exercise prescription set using results from initial 6 min walk test and THRR while considering  patient's activity barriers and safety.   Exercise Prescription Goal: Initial exercise prescription builds to 30-45 minutes a day of aerobic activity, 2-3 days per week.  Home exercise guidelines will be given to patient during program as part of exercise prescription that the participant will acknowledge.  Activity Barriers & Risk Stratification:  Activity Barriers & Cardiac Risk Stratification - 09/02/21 0852       Activity Barriers & Cardiac Risk Stratification   Activity Barriers Right Hip Replacement;Left Hip Replacement;Arthritis;Back Problems;Joint Problems;Deconditioning;Shortness of Breath;Balance Concerns;Other (comment)    Comments hammer toes, atrial fibrillation    Cardiac Risk Stratification Moderate             6 Minute Walk:  6 Minute Walk     Row Name 09/02/21 0851         6 Minute Walk   Phase Initial     Distance 750 feet     Walk Time 6 minutes     # of Rest Breaks 0     MPH 1.42     METS 1.22     RPE 13     Perceived Dyspnea  15     VO2 Peak 4.29     Symptoms No     Resting HR 59 bpm     Resting BP 148/62     Resting Oxygen Saturation  98 %     Exercise Oxygen Saturation  during 6 min walk 96 %      Max Ex. HR 120 bpm     Max Ex. BP 158/60     2 Minute Post BP 120/56       Interval HR   1 Minute HR 110     2 Minute HR 109     3 Minute HR 119     4 Minute HR 116     5 Minute HR 116     6 Minute HR 120     2 Minute Post HR 68     Interval Heart Rate? Yes       Interval Oxygen   Interval Oxygen? Yes     Baseline Oxygen Saturation % 98 %     1 Minute Oxygen Saturation % 97 %     1 Minute Liters of Oxygen 0 L     2 Minute Oxygen Saturation % 97 %     2 Minute Liters of Oxygen 0 L     3 Minute Oxygen Saturation % 96 %     3 Minute Liters of Oxygen 0 L     4 Minute Oxygen Saturation % 97 %     4 Minute Liters of Oxygen 0 L     5 Minute Oxygen Saturation % 97 %     5 Minute Liters of Oxygen 0 L     6 Minute Oxygen Saturation % 97 %     6 Minute Liters of Oxygen 0 L     2 Minute Post Oxygen Saturation % 98 %     2 Minute Post Liters of Oxygen  0 L              Oxygen Initial Assessment:  Oxygen Initial Assessment - 09/02/21 0921       Initial 6 min Walk   Oxygen Used None      Program Oxygen Prescription   Program Oxygen Prescription None      Intervention   Short Term Goals To learn and understand importance of maintaining oxygen saturations>88%;To learn and understand importance of monitoring SPO2 with pulse oximeter and demonstrate accurate use of the pulse oximeter.;To learn and demonstrate proper pursed lip breathing techniques or other breathing techniques.     Long  Term Goals Maintenance of O2 saturations>88%;Verbalizes importance of monitoring SPO2 with pulse oximeter and return demonstration;Exhibits proper breathing techniques, such as pursed lip breathing or other method taught during program session             Oxygen Re-Evaluation:   Oxygen Discharge (Final Oxygen Re-Evaluation):   Initial Exercise Prescription:  Initial Exercise Prescription - 09/02/21 0800       Date of Initial Exercise RX and Referring Provider   Date 09/02/21     Referring Provider Dr. Annamaria Boots    Expected Discharge Date 01/06/22      Treadmill   MPH 1    Grade 0    Minutes 17      NuStep   Level 1    SPM 70    Minutes 22      Prescription Details   Frequency (times per week) 2    Duration Progress to 30 minutes of continuous aerobic without signs/symptoms of physical distress      Intensity   THRR 40-80% of Max Heartrate 56-111    Ratings of Perceived Exertion 11-13    Perceived Dyspnea 0-4      Resistance Training   Training Prescription Yes    Weight 2    Reps 10-15             Perform Capillary Blood Glucose checks as needed.  Exercise Prescription Changes:   Exercise Comments:   Exercise Goals and Review:   Exercise Goals     Row Name 09/02/21 0854             Exercise Goals   Increase Physical Activity Yes       Intervention Provide advice, education, support and counseling about physical activity/exercise needs.;Develop an individualized exercise prescription for aerobic and resistive training based on initial evaluation findings, risk stratification, comorbidities and participant's personal goals.       Expected Outcomes Short Term: Attend rehab on a regular basis to increase amount of physical activity.;Long Term: Add in home exercise to make exercise part of routine and to increase amount of physical activity.;Long Term: Exercising regularly at least 3-5 days a week.       Increase Strength and Stamina Yes       Intervention Provide advice, education, support and counseling about physical activity/exercise needs.;Develop an individualized exercise prescription for aerobic and resistive training based on initial evaluation findings, risk stratification, comorbidities and participant's personal goals.       Expected Outcomes Short Term: Increase workloads from initial exercise prescription for resistance, speed, and METs.;Short Term: Perform resistance training exercises routinely during rehab and add in  resistance training at home;Long Term: Improve cardiorespiratory fitness, muscular endurance and strength as measured by increased METs and functional capacity (6MWT)       Able to understand and use rate of perceived exertion (RPE) scale Yes  Intervention Provide education and explanation on how to use RPE scale       Expected Outcomes Short Term: Able to use RPE daily in rehab to express subjective intensity level;Long Term:  Able to use RPE to guide intensity level when exercising independently       Able to understand and use Dyspnea scale Yes       Intervention Provide education and explanation on how to use Dyspnea scale       Expected Outcomes Short Term: Able to use Dyspnea scale daily in rehab to express subjective sense of shortness of breath during exertion;Long Term: Able to use Dyspnea scale to guide intensity level when exercising independently       Knowledge and understanding of Target Heart Rate Range (THRR) Yes       Intervention Provide education and explanation of THRR including how the numbers were predicted and where they are located for reference       Expected Outcomes Long Term: Able to use THRR to govern intensity when exercising independently;Short Term: Able to use daily as guideline for intensity in rehab;Short Term: Able to state/look up THRR       Understanding of Exercise Prescription Yes       Intervention Provide education, explanation, and written materials on patient's individual exercise prescription       Expected Outcomes Short Term: Able to explain program exercise prescription;Long Term: Able to explain home exercise prescription to exercise independently                Exercise Goals Re-Evaluation :   Discharge Exercise Prescription (Final Exercise Prescription Changes):   Nutrition:  Target Goals: Understanding of nutrition guidelines, daily intake of sodium '1500mg'$ , cholesterol '200mg'$ , calories 30% from fat and 7% or less from saturated  fats, daily to have 5 or more servings of fruits and vegetables.  Biometrics:  Pre Biometrics - 09/02/21 0855       Pre Biometrics   Height 5' 1.5" (1.562 m)    Weight 196 lb 10.4 oz (89.2 kg)    Waist Circumference 46 inches    Hip Circumference 47 inches    Waist to Hip Ratio 0.98 %    BMI (Calculated) 36.56    Triceps Skinfold 8 mm    % Body Fat 42.7 %    Grip Strength 24.8 kg    Flexibility 0 in    Single Leg Stand 0 seconds              Nutrition Therapy Plan and Nutrition Goals:  Nutrition Therapy & Goals - 09/02/21 0826       Personal Nutrition Goals   Nutrition Goal For heart healthy choices add >50% of whole grains, make half their plate fruits and vegetables. Discuss the difference between starchy vegetables and leafy greens, and how leafy vegetables provide fiber, helps maintain healthy weight, helps control blood glucose, and lowers cholesterol.  Discuss purchasing fresh or frozen vegetable to reduce sodium and not to add grease, fat or sugar. Consume <18oz of red meat per week. Consume lean cuts of meats and very little of meats high in sodium and nitrates such as pork and lunch meats. Discussed portion control for all food groups.        Intervention Plan   Intervention Nutrition handout(s) given to patient.    Expected Outcomes Short Term Goal: Understand basic principles of dietary content, such as calories, fat, sodium, cholesterol and nutrients.  Nutrition Assessments:  Nutrition Assessments - 09/02/21 0827       MEDFICTS Scores   Pre Score 71            MEDIFICTS Score Key: ?70 Need to make dietary changes  40-70 Heart Healthy Diet ? 40 Therapeutic Level Cholesterol Diet   Picture Your Plate Scores: <84 Unhealthy dietary pattern with much room for improvement. 41-50 Dietary pattern unlikely to meet recommendations for good health and room for improvement. 51-60 More healthful dietary pattern, with some room for improvement.   >60 Healthy dietary pattern, although there may be some specific behaviors that could be improved.    Nutrition Goals Re-Evaluation:   Nutrition Goals Discharge (Final Nutrition Goals Re-Evaluation):   Psychosocial: Target Goals: Acknowledge presence or absence of significant depression and/or stress, maximize coping skills, provide positive support system. Participant is able to verbalize types and ability to use techniques and skills needed for reducing stress and depression.  Initial Review & Psychosocial Screening:  Initial Psych Review & Screening - 09/02/21 0854       Initial Review   Current issues with None Identified      Family Dynamics   Good Support System? Yes    Comments Her support system includes her two nieces and the members of her church.      Barriers   Psychosocial barriers to participate in program There are no identifiable barriers or psychosocial needs.      Screening Interventions   Interventions Encouraged to exercise    Expected Outcomes Long Term goal: The participant improves quality of Life and PHQ9 Scores as seen by post scores and/or verbalization of changes;Short Term goal: Identification and review with participant of any Quality of Life or Depression concerns found by scoring the questionnaire.             Quality of Life Scores:  Quality of Life - 09/02/21 0856       Quality of Life   Select Quality of Life      Quality of Life Scores   Health/Function Pre 23.2 %    Socioeconomic Pre 26.08 %    Psych/Spiritual Pre 24.64 %    Family Pre 23.5 %    GLOBAL Pre 24.11 %            Scores of 19 and below usually indicate a poorer quality of life in these areas.  A difference of  2-3 points is a clinically meaningful difference.  A difference of 2-3 points in the total score of the Quality of Life Index has been associated with significant improvement in overall quality of life, self-image, physical symptoms, and general health in  studies assessing change in quality of life.   PHQ-9: Review Flowsheet        09/02/2021 03/18/2018 10/01/2017  Depression screen PHQ 2/9  Decreased Interest 0 0 0  Down, Depressed, Hopeless 0 0 0  PHQ - 2 Score 0 0 0  Altered sleeping '2 1 2  '$ Tired, decreased energy '2 2 2  '$ Change in appetite 0 2 2  Feeling bad or failure about yourself  0 0 0  Trouble concentrating 0 2 0  Moving slowly or fidgety/restless 0 0 0  Suicidal thoughts 0 0 0  PHQ-9 Score '4 7 6  '$ Difficult doing work/chores Somewhat difficult Not difficult at all Not difficult at all         Interpretation of Total Score  Total Score Depression Severity:  1-4 = Minimal depression, 5-9 =  Mild depression, 10-14 = Moderate depression, 15-19 = Moderately severe depression, 20-27 = Severe depression   Psychosocial Evaluation and Intervention:  Psychosocial Evaluation - 09/02/21 0922       Psychosocial Evaluation & Interventions   Interventions Encouraged to exercise with the program and follow exercise prescription    Comments Pt has no barriers to participating in PR. She has no identifiable psychsocial issues. She scored a 4 on her PHQ-9 and this relates to her sleeping too much due to her lack of energy. She believes that this is due to her chronic lung condition and her atrial fibrillation. She reports that she took antidepressants about 15 years ago when her mother passed away, but she did not like the way that the medications made her feel, and that she would not consider taking these medications again. She is also participating in PT twice per week with the hopes that she will be able to strengthen her body. She reports that her support system includes her two nieces and her church family. Her goals while in the program are to decrease her SOB with ADL's, improve her energy levels, and to be able to walk longer distances. She specifically states that she would like to be able to take a 30 minute walk without stopping. She  has a positive mindset and is eager to start the program. She previously participated in [place] in 2019, and she felt like it helped her a lot then.    Expected Outcomes Pt will continue to have no identifiable psychosocial issues.    Continue Psychosocial Services  No Follow up required             Psychosocial Re-Evaluation:   Psychosocial Discharge (Final Psychosocial Re-Evaluation):    Education: Education Goals: Education classes will be provided on a weekly basis, covering required topics. Participant will state understanding/return demonstration of topics presented.  Learning Barriers/Preferences:  Learning Barriers/Preferences - 09/02/21 0857       Learning Barriers/Preferences   Learning Barriers None    Learning Preferences Skilled Demonstration;Individual Instruction;Group Instruction             Education Topics: How Lungs Work and Diseases: - Discuss the anatomy of the lungs and diseases that can affect the lungs, such as COPD. Flowsheet Row PULMONARY REHAB OTHER RESPIRATORY from 03/10/2018 in [facility]  Date 11/04/17  Educator DC  Instruction Review Code 2- Demonstrated Understanding       Exercise: -Discuss the importance of exercise, FITT principles of exercise, normal and abnormal responses to exercise, and how to exercise safely.   Environmental Irritants: -Discuss types of environmental irritants and how to limit exposure to environmental irritants. Flowsheet Row PULMONARY REHAB OTHER RESPIRATORY from 03/10/2018 in [place]  Date 11/11/17  Educator DWynetta Emery  Instruction Review Code 2- Demonstrated Understanding       Meds/Inhalers and oxygen: - Discuss respiratory medications, definition of an inhaler and oxygen, and the proper way to use an inhaler and oxygen. Flowsheet Row PULMONARY REHAB OTHER RESPIRATORY from 03/10/2018 in [facility]  Date 11/18/17  Educator D.  Wynetta Emery       Energy Saving Techniques: - Discuss methods to conserve energy and decrease shortness of breath when performing activities of daily living.  Flowsheet Row PULMONARY REHAB OTHER RESPIRATORY from 03/10/2018 in [facility]  Date 11/25/17  Educator Etheleen Mayhew  Instruction Review Code 2- Demonstrated Understanding       Bronchial Hygiene / Breathing  Techniques: - Discuss breathing mechanics, pursed-lip breathing technique,  proper posture, effective ways to clear airways, and other functional breathing techniques   Cleaning Equipment: - Provides group verbal and written instruction about the health risks of elevated stress, cause of high stress, and healthy ways to reduce stress. Flowsheet Row PULMONARY REHAB OTHER RESPIRATORY from 03/10/2018 in [place]  Date 03/10/18  Educator [place]  Instruction Review Code 2- Demonstrated Understanding       Nutrition I: Fats: - Discuss the types of cholesterol, what cholesterol does to the body, and how cholesterol levels can be controlled. Flowsheet Row PULMONARY REHAB OTHER RESPIRATORY from 03/10/2018 in [place]  Date 12/16/17  Educator Etheleen Mayhew  Instruction Review Code 2- Demonstrated Understanding       Nutrition II: Labels: -Discuss the different components of food labels and how to read food labels. Flowsheet Row PULMONARY REHAB OTHER RESPIRATORY from 03/10/2018 in [place]  Date 12/23/17  Educator Etheleen Mayhew  Instruction Review Code 2- Demonstrated Understanding       Respiratory Infections: - Discuss the signs and symptoms of respiratory infections, ways to prevent respiratory infections, and the importance of seeking medical treatment when having a respiratory infection. Flowsheet Row PULMONARY REHAB OTHER RESPIRATORY from 03/10/2018 in [place]  Date 01/06/18  Educator Etheleen Mayhew   Instruction Review Code 2- Demonstrated Understanding       Stress I: Signs and Symptoms: - Discuss the causes of stress, how stress may lead to anxiety and depression, and ways to limit stress. Flowsheet Row PULMONARY REHAB OTHER RESPIRATORY from 03/10/2018 in [place]  Date 10/07/17  Educator DC  Instruction Review Code 2- Demonstrated Understanding       Stress II: Relaxation: -Discuss relaxation techniques to limit stress. Flowsheet Row PULMONARY REHAB OTHER RESPIRATORY from 03/10/2018 in [place]  Date 10/14/17  Educator DC  Instruction Review Code 2- Demonstrated Understanding       Oxygen for Home/Travel: - Discuss how to prepare for travel when on oxygen and proper ways to transport and store oxygen to ensure safety.   Knowledge Questionnaire Score:  Knowledge Questionnaire Score - 09/02/21 0824       Knowledge Questionnaire Score   Pre Score 13/18             Core Components/Risk Factors/Patient Goals at Admission:  Personal Goals and Risk Factors at Admission - 09/02/21 0900       Core Components/Risk Factors/Patient Goals on Admission    Weight Management Yes;Obesity;Weight Loss    Intervention Weight Management: Develop a combined nutrition and exercise program designed to reach desired caloric intake, while maintaining appropriate intake of nutrient and fiber, sodium and fats, and appropriate energy expenditure required for the weight goal.;Weight Management: Provide education and appropriate resources to help participant work on and attain dietary goals.;Weight Management/Obesity: Establish reasonable short term and long term weight goals.;Obesity: Provide education and appropriate resources to help participant work on and attain dietary goals.    Expected Outcomes Short Term: Continue to assess and modify interventions until short term weight is achieved;Long Term: Adherence to nutrition and physical  activity/exercise program aimed toward attainment of established weight goal;Weight Maintenance: Understanding of the daily nutrition guidelines, which includes 25-35% calories from fat, 7% or less cal from saturated fats, less than '200mg'$  cholesterol, less than 1.5gm of sodium, & 5 or more servings of fruits and vegetables daily;Weight Loss: Understanding of general recommendations  for a balanced deficit meal plan, which promotes 1-2 lb weight loss per week and includes a negative energy balance of [phone] kcal/d;Understanding recommendations for meals to include 15-35% energy as protein, 25-35% energy from fat, 35-60% energy from carbohydrates, less than '200mg'$  of dietary cholesterol, 20-35 gm of total fiber daily;Understanding of distribution of calorie intake throughout the day with the consumption of 4-5 meals/snacks    Improve shortness of breath with ADL's Yes    Intervention Provide education, individualized exercise plan and daily activity instruction to help decrease symptoms of SOB with activities of daily living.    Expected Outcomes Short Term: Improve cardiorespiratory fitness to achieve a reduction of symptoms when performing ADLs;Long Term: Be able to perform more ADLs without symptoms or delay the onset of symptoms    Personal Goal Other Yes    Personal Goal She wants to improve her energy level and to be able to walk longer distances.    Intervention Attend CR 3 x week and supplement with at home exercise 2 x week.     Expected Outcomes Achieve personal goals.              Core Components/Risk Factors/Patient Goals Review:    Core Components/Risk Factors/Patient Goals at Discharge (Final Review):    ITP Comments:   Comments: Patient arrived for 1st visit/orientation/education at 0800. Patient was referred to PR by Dr. Annamaria Boots due to moderate persistent asthma, unspecified whether complicated (U88.28). During orientation advised patient on arrival and appointment times what to  wear, what to do before, during and after exercise. Reviewed attendance and class policy.  Pt is scheduled to return Pulmonary Rehab on 09/04/2021 at 1045. Pt was advised to come to class 15 minutes before class starts.  Discussed RPE/Dpysnea scales. Patient participated in warm up stretches. Patient was able to complete 6 minute walk test. Patient was measured for the equipment. Discussed equipment safety with patient. Took patient pre-anthropometric measurements. Patient finished visit at 0900.

## 2021-09-04 ENCOUNTER — Encounter (HOSPITAL_COMMUNITY)
Admission: RE | Admit: 2021-09-04 | Discharge: 2021-09-04 | Disposition: A | Discharge status: Home / Self Care | Payer: Medicare Other | Source: Ambulatory Visit | Attending: Internal Medicine | Admitting: Internal Medicine

## 2021-09-04 DIAGNOSIS — J4540 Moderate persistent asthma, uncomplicated: Secondary | ICD-10-CM | POA: Diagnosis not present

## 2021-09-04 NOTE — Progress Notes (Signed)
Daily Session Note  Patient Details  Name: [name] [name] MRN: [id number] Date of Birth: [birth year] Referring Provider:   Flowsheet Row PULMONARY REHAB OTHER RESP ORIENTATION from 09/02/2021 in [place]  Referring Provider Dr. Annamaria Boots       Encounter Date: 09/04/2021  Check In:  Session Check In - 09/04/21 1045       Check-In   Supervising physician immediately available to respond to emergencies [facility] MD immediately available    Physician(s) Dr. Domenic Polite    Location [facility]    Staff Present Geanie Cooley, RN;Davisha Linthicum Zigmund Daniel, Exercise Physiologist    Virtual Visit No    Medication changes reported     No    Fall or balance concerns reported    Yes    Comments She has not fallen but walks with a cane for support. 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    Tobacco Cessation No Change    Warm-up and Cool-down Performed as group-led instruction    Resistance Training Performed Yes    VAD Patient? No    PAD/SET Patient? No      Pain Assessment   Currently in Pain? No/denies    Pain Score 0-No pain    Multiple Pain Sites No             Capillary Blood Glucose: No results found for this or any previous visit (from the past 24 hour(s)).    Social History   Tobacco Use  Smoking Status Never  Smokeless Tobacco Never    Goals Met:  Independence with exercise equipment Exercise tolerated well No report of concerns or symptoms today Strength training completed today  Goals Unmet:  Not Applicable  Comments: check out 1145   Dr. Kathie Dike is Medical Director for [facility] Pulmonary Rehab.

## 2021-09-04 NOTE — Progress Notes (Signed)
Diagnosis: Asthma  Provider:  Praveen Mannam, MD  Procedure: Injection  Nucala (Mepolizumab), Dose: 100 mg, Site: subcutaneous, Number of injections: 1  Discharge: Condition: Good, Destination: Home . AVS provided to patient.   Performed by:  Cathy Alston, RN       

## 2021-09-05 ENCOUNTER — Ambulatory Visit (INDEPENDENT_AMBULATORY_CARE_PROVIDER_SITE_OTHER): Payer: Medicare Other | Admitting: Internal Medicine

## 2021-09-05 ENCOUNTER — Encounter: Payer: Self-pay | Admitting: Internal Medicine

## 2021-09-05 VITALS — BP 136/71 | HR 55 | Ht 61.5 in | Wt 199.0 lb

## 2021-09-05 DIAGNOSIS — I4819 Other persistent atrial fibrillation: Secondary | ICD-10-CM | POA: Diagnosis not present

## 2021-09-05 DIAGNOSIS — Z79899 Other long term (current) drug therapy: Secondary | ICD-10-CM

## 2021-09-05 DIAGNOSIS — I10 Essential (primary) hypertension: Secondary | ICD-10-CM | POA: Diagnosis not present

## 2021-09-05 DIAGNOSIS — R931 Abnormal findings on diagnostic imaging of heart and coronary circulation: Secondary | ICD-10-CM | POA: Diagnosis not present

## 2021-09-05 DIAGNOSIS — E785 Hyperlipidemia, unspecified: Secondary | ICD-10-CM

## 2021-09-05 NOTE — Progress Notes (Signed)
PCP: Monico Blitz, MD Primary Cardiologist: Dr Domenic Polite Primary EP: Dr Rayann Heman  [name] [name] is a 81 y.o. female who presents today for routine electrophysiology followup.  Since last being seen in our clinic, the patient reports doing reasonably well.  Her SOB is stable.  Today, she denies symptoms of palpitations, chest pain,  lower extremity edema, dizziness, presyncope, or syncope.  The patient is otherwise without complaint today.   Past Medical History:  Diagnosis Date   Allergic rhinitis    Anxiety    Asthma    Atrial flutter (HCC)    COPD (chronic obstructive pulmonary disease) ([place])    Depression    Essential hypertension    Hyperlipidemia    Lumbar disc disease    Obesity    OSA (obstructive sleep apnea)    CPAP   Paroxysmal atrial fibrillation (HCC)    Element of tachycardia bradycardia syndrome   Respiratory failure ([place])    Past Surgical History:  Procedure Laterality Date   ANTERIOR FUSION LUMBAR SPINE     ATRIAL FIBRILLATION ABLATION N/A 09/12/2020   Procedure: ATRIAL FIBRILLATION ABLATION;  Surgeon: Thompson Grayer, MD;  Location: [place] CV LAB;  Service: Cardiovascular;  Laterality: N/A;   BIOPSY  08/27/2016   Procedure: BIOPSY;  Surgeon: Rogene Houston, MD;  Location[facility];  Service: Endoscopy;;  FOUR GASTRIC POLYPS BIOPSIED   CARDIOVERSION N/A 03/28/2014   Procedure: CARDIOVERSION;  Surgeon: Fay Records, MD;  Location: [facility];  Service: Cardiovascular;  Laterality: N/A;   CARDIOVERSION N/A 10/22/2016   Procedure: CARDIOVERSION;  Surgeon: Sueanne Margarita, MD;  Location: [facility] ENDOSCOPY;  Service: Cardiovascular;  Laterality: N/A;   CARDIOVERSION N/A 11/26/2020   Procedure: CARDIOVERSION;  Surgeon: Fay Records, MD;  Location: [place];  Service: Cardiovascular;  Laterality: N/A;   CATARACT EXTRACTION     COLONOSCOPY N/A 08/04/2012   Procedure: COLONOSCOPY;  Surgeon: Rogene Houston, MD;  Location[facility];  Service: Endoscopy;   Laterality: N/A;  730-rescheduled to [place] notified pt   ELECTROPHYSIOLOGIC STUDY N/A 09/18/2014   Procedure: Atrial Fibrillation Ablation;  Surgeon: Thompson Grayer, MD;  Location: [place] CV LAB;  Service: Cardiovascular;  Laterality: N/A;   ESOPHAGOGASTRODUODENOSCOPY N/A 08/27/2016   Procedure: ESOPHAGOGASTRODUODENOSCOPY (EGD);  Surgeon: Rogene Houston, MD;  Location[facility];  Service: Endoscopy;  Laterality: N/A;  1200   implantable loop recorder placement  11/22/2020   Medtronic Reveal Linq model LNQ 11 (SN [id number]) implantable loop recorder   LASIK     Both eyes   TEE WITHOUT CARDIOVERSION N/A 09/18/2014   Procedure: TRANSESOPHAGEAL ECHOCARDIOGRAM (TEE);  Surgeon: Larey Dresser, MD;  Location: [place];  Service: Cardiovascular;  Laterality: N/A;   TEE WITHOUT CARDIOVERSION N/A 10/22/2016   Procedure: TRANSESOPHAGEAL ECHOCARDIOGRAM (TEE);  Surgeon: Sueanne Margarita, MD;  Location[facility] ENDOSCOPY;  Service: Cardiovascular;  Laterality: N/A;   TEE WITHOUT CARDIOVERSION N/A 08/24/2017   Procedure: TRANSESOPHAGEAL ECHOCARDIOGRAM (TEE);  Surgeon: Josue Hector, MD;  Location: [place];  Service: Cardiovascular;  Laterality: N/A;   TOTAL HIP ARTHROPLASTY  03/30/2008   Right    ROS- all systems are reviewed and negatives except as per HPI above  Current Outpatient Medications  Medication Sig Dispense Refill   acetaminophen (TYLENOL) 500 MG tablet Take 500 mg by mouth every 8 (eight) hours as needed for headache.     antiseptic oral rinse (BIOTENE) LIQD 15 mLs by Mouth Rinse route daily as needed  for dry mouth.     atorvastatin (LIPITOR) 10 MG tablet Take 10 mg by mouth daily at 6 PM.     benzonatate (TESSALON) 200 MG capsule Take 1 capsule (200 mg total) by mouth 3 (three) times daily as needed for cough. 30 capsule 3   Biotin 1 MG CAPS Take 1,000 mg by mouth 3 (three) times a week.      budesonide (PULMICORT) 0.5 MG/2ML nebulizer solution Take 0.5 mg by  nebulization daily. Additional if needed during the day     Calcium Carbonate-Vit D-Min (QC CALCIUM-MAGNESIUM-ZINC-D3) 333.4-133 MG-UNIT TABS Take 1 tablet by mouth 2 (two) times daily.      cetirizine (ZYRTEC) 10 MG tablet Take 10 mg by mouth at bedtime as needed for allergies.     clindamycin (CLEOCIN) 300 MG capsule Take 600 mg by mouth See admin instructions. Take prior to dental procedures  0   diltiazem (CARDIZEM CD) 120 MG 24 hr capsule Take 1 capsule (120 mg total) by mouth every evening.     diltiazem (CARDIZEM CD) 240 MG 24 hr capsule Take 1 capsule (240 mg total) by mouth every morning. 90 capsule 1   diltiazem (CARDIZEM) 30 MG tablet TAKE 1 TABLET BY MOUTH AS NEEDED FOR PALPITATIONS 45 tablet 2   dofetilide (TIKOSYN) 500 MCG capsule TAKE ONE CAPSULE BY MOUTH TWICE A DAY 180 capsule 1   EPINEPHrine 0.3 mg/0.3 mL IJ SOAJ injection Inject 0.3 mg into the muscle as needed for anaphylaxis.     famotidine (PEPCID) 20 MG tablet Take 2 tablets (40 mg total) by mouth at bedtime as needed. 30 tablet 1   fluticasone (FLONASE) 50 MCG/ACT nasal spray Place 1 spray into both nostrils 2 (two) times daily.     hydrALAZINE (APRESOLINE) 100 MG tablet Take 100 mg by mouth 2 (two) times daily.     HYDROcodone bit-homatropine (HYCODAN) 5-1.5 MG/5ML syrup Take 5 mLs by mouth every 6 (six) hours as needed for cough.     Hypertonic Nasal Wash (SINUS RINSE) PACK Place 1 each into the nose daily as needed (clear nasal passage). Neilmed sinus rinse     ipratropium-albuterol (DUONEB) 0.5-2.5 (3) MG/3ML SOLN Take 3 mLs by nebulization 4 (four) times daily as needed (Shortness of breathing).     losartan (COZAAR) 100 MG tablet TAKE 1 TABLET BY MOUTH DAILY 90 tablet 3   Magnesium 250 MG TABS Take 250 mg by mouth daily.     montelukast (SINGULAIR) 10 MG tablet Take 10 mg by mouth at bedtime.     omeprazole (PRILOSEC) 40 MG capsule Take 1 capsule (40 mg total) by mouth daily. 60 capsule 2   Polyethyl Glycol-Propyl  Glycol (SYSTANE) 0.4-0.3 % GEL ophthalmic gel Place 1 application. into both eyes daily as needed (Dry eye).     Polyethyl Glycol-Propyl Glycol (SYSTANE) 0.4-0.3 % SOLN Place 1 drop into both eyes daily as needed (Dry eye).     potassium chloride SA (KLOR-CON M) 20 MEQ tablet TAKE 1 TABLET BY MOUTH DAILY 90 tablet 3   rivaroxaban (XARELTO) 20 MG TABS tablet Take 1 tablet (20 mg total) by mouth daily with supper. 90 tablet 3   umeclidinium-vilanterol (ANORO ELLIPTA) 62.5-25 MCG/ACT AEPB INHALE ONE PUFF INTO THE LUNGS DAILY (Patient taking differently: 1 puff every morning.) 60 each 11   Wheat Dextrin (BENEFIBER ON THE GO) POWD Take 10 mLs by mouth daily.     No current facility-administered medications for this visit.    Physical Exam: Vitals:  09/05/21 1353  BP: 136/71  Pulse: (!) 55  SpO2: 96%  Weight: 199 lb (90.3 kg)  Height: 5' 1.5" (1.562 m)    GEN- The patient is overweight appearing, alert and oriented x 3 today.   Head- normocephalic, atraumatic Eyes-  Sclera clear, conjunctiva pink Ears- hearing intact Oropharynx- clear Lungs- Clear to ausculation bilaterally, normal work of breathing Heart- Regular rate and rhythm, no murmurs, rubs or gallops, PMI not laterally displaced GI- soft, NT, ND, + BS Extremities- no clubbing, cyanosis, or edema  Wt Readings from Last 3 Encounters:  09/05/21 199 lb (90.3 kg)  09/02/21 196 lb 10.4 oz (89.2 kg)  08/27/21 198 lb 12.8 oz (90.2 kg)    EKG tracing ordered today is personally reviewed and shows sinus rhythm 55 bpm, PR 208 msec, Qtc 470 msec  Assessment and Plan:  Persistent atrial fibrillation [place] by ILR is 2 % (previously 2.7%) She is on tikosyn post ablation She is on [employer] 04/16/21 reviewed Cbc, bmet, mg ordered today Her ILR histrograms suggest that she may be having more atrial flutter than we are aware.  I worry about underdetection by the device.  Clinically, she feels well. Clearly in sinus today. I have  turned AF to AT/AF and changes sensitivity from least to less for now.  2. HTN Stable No change required today  3. Obesity Body mass index is 36.99 kg/m. Lifestyle modification advised  Risks, benefits and potential toxicities for medications prescribed and/or refilled reviewed with patient today.   Follow-up in AF clinic in 6 months  Thompson Grayer MD, [facility] 09/05/2021 2:01 PM

## 2021-09-05 NOTE — Patient Instructions (Signed)
Medication Instructions:  Your physician recommends that you continue on your current medications as directed. Please refer to the Current Medication list given to you today.  *If you need a refill on your cardiac medications before your next appointment, please call your pharmacy*   Lab Work: BMET and Mg If you have labs (blood work) drawn today and your tests are completely normal, you will receive your results only by: [facility] (if you have MyChart) OR A paper copy in the mail If you have any lab test that is abnormal or we need to change your treatment, we will call you to review the results.   Testing/Procedures: None ordered.    Follow-Up: At [facility], you and your health needs are our priority.  As part of our continuing mission to provide you with exceptional heart care, we have created designated Provider Care Teams.  These Care Teams include your primary Cardiologist (physician) and Advanced Practice Providers (APPs -  Physician Assistants and Nurse Practitioners) who all work together to provide you with the care you need, when you need it.  We recommend signing up for the patient portal called "MyChart".  Sign up information is provided on this After Visit Summary.  MyChart is used to connect with patients for Virtual Visits (Telemedicine).  Patients are able to view lab/test results, encounter notes, upcoming appointments, etc.  Non-urgent messages can be sent to your provider as well.   To learn more about what you can do with MyChart, go to [email or web address].    Your next appointment:   Afib clinic in 6 months  Important Information About Sugar

## 2021-09-08 ENCOUNTER — Ambulatory Visit (INDEPENDENT_AMBULATORY_CARE_PROVIDER_SITE_OTHER): Payer: Medicare Other | Admitting: Podiatry

## 2021-09-08 ENCOUNTER — Encounter: Payer: Self-pay | Admitting: Podiatry

## 2021-09-08 ENCOUNTER — Other Ambulatory Visit: Payer: Self-pay | Admitting: Internal Medicine

## 2021-09-08 ENCOUNTER — Ambulatory Visit (INDEPENDENT_AMBULATORY_CARE_PROVIDER_SITE_OTHER): Payer: Medicare Other

## 2021-09-08 DIAGNOSIS — B351 Tinea unguium: Secondary | ICD-10-CM

## 2021-09-08 DIAGNOSIS — M79673 Pain in unspecified foot: Secondary | ICD-10-CM | POA: Diagnosis not present

## 2021-09-08 DIAGNOSIS — L84 Corns and callosities: Secondary | ICD-10-CM

## 2021-09-08 DIAGNOSIS — M79674 Pain in right toe(s): Secondary | ICD-10-CM | POA: Diagnosis not present

## 2021-09-08 DIAGNOSIS — M792 Neuralgia and neuritis, unspecified: Secondary | ICD-10-CM | POA: Diagnosis not present

## 2021-09-08 DIAGNOSIS — W458XXA Other foreign body or object entering through skin, initial encounter: Secondary | ICD-10-CM | POA: Diagnosis not present

## 2021-09-08 DIAGNOSIS — M79675 Pain in left toe(s): Secondary | ICD-10-CM

## 2021-09-08 DIAGNOSIS — Z79899 Other long term (current) drug therapy: Secondary | ICD-10-CM | POA: Diagnosis not present

## 2021-09-08 DIAGNOSIS — I10 Essential (primary) hypertension: Secondary | ICD-10-CM | POA: Diagnosis not present

## 2021-09-08 DIAGNOSIS — D649 Anemia, unspecified: Secondary | ICD-10-CM

## 2021-09-08 DIAGNOSIS — E785 Hyperlipidemia, unspecified: Secondary | ICD-10-CM | POA: Diagnosis not present

## 2021-09-08 DIAGNOSIS — Z5181 Encounter for therapeutic drug level monitoring: Secondary | ICD-10-CM | POA: Diagnosis not present

## 2021-09-08 DIAGNOSIS — I4819 Other persistent atrial fibrillation: Secondary | ICD-10-CM | POA: Diagnosis not present

## 2021-09-08 MED ORDER — CEPHALEXIN 500 MG PO CAPS: 500.0000 mg | ORAL_CAPSULE | Freq: Three times a day (TID) | ORAL | 0 refills | Status: DC

## 2021-09-08 NOTE — Patient Instructions (Addendum)
DRESSING CHANGES LEFT GREAT TOE  APPLY NEOSPORIN TO LEFT GREAT TOE ONCE DAILY AND COVER WITH FABRIC BAND-AID ONCE DAILY FOR 10 DAYS.   CHECK YOUR TETANUS STATUS WITH YOUR PRIMARY CARE DOCTOR.  DO NOT WALK BAREFOOT!!!  IF YOU EXPERIENCE ANY FEVER, CHILLS, NIGHTSWEATS, NAUSEA OR VOMITING, ELEVATED OR LOW BLOOD SUGARS, REPORT TO EMERGENCY ROOM.  IF YOU EXPERIENCE INCREASED REDNESS, PAIN, SWELLING, DISCOLORATION, ODOR, PUS, DRAINAGE OR WARMTH OF YOUR FOOT, REPORT TO EMERGENCY ROOM.

## 2021-09-09 ENCOUNTER — Encounter (HOSPITAL_COMMUNITY)
Admission: RE | Admit: 2021-09-09 | Discharge: 2021-09-09 | Disposition: A | Discharge status: Home / Self Care | Payer: Medicare Other | Source: Ambulatory Visit | Attending: Internal Medicine | Admitting: Internal Medicine

## 2021-09-09 DIAGNOSIS — J4540 Moderate persistent asthma, uncomplicated: Secondary | ICD-10-CM | POA: Diagnosis not present

## 2021-09-09 NOTE — Progress Notes (Signed)
Daily Session Note  Patient Details  Name: [name] [name] MRN: [id number] Date of Birth: 1940-08-11 Referring Provider:   Flowsheet Row PULMONARY REHAB OTHER RESP ORIENTATION from 09/02/2021 in [place]  Referring Provider Dr. Annamaria Boots       Encounter Date: 09/09/2021  Check In:  Session Check In - 09/09/21 1045       Check-In   Supervising physician immediately available to respond to emergencies [facility] MD immediately available    Physician(s) Dr Audie Box    Location [facility]    Staff Present Geanie Cooley, RN;Heather Otho Ket, BS, Exercise Physiologist;Karlin Heilman Hassell Done, RN, BSN    Virtual Visit No    Medication changes reported     No    Fall or balance concerns reported    Yes    Comments She has not fallen but walks with a cane for support. 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    Tobacco Cessation No Change    Warm-up and Cool-down Performed as group-led instruction    Resistance Training Performed Yes    VAD Patient? No    PAD/SET Patient? No      Pain Assessment   Currently in Pain? No/denies    Pain Score 0-No pain    Multiple Pain Sites No             Capillary Blood Glucose: No results found for this or any previous visit (from the past 24 hour(s)).    Social History   Tobacco Use  Smoking Status Never  Smokeless Tobacco Never    Goals Met:  Independence with exercise equipment Exercise tolerated well No report of concerns or symptoms today Strength training completed today  Goals Unmet:  Not Applicable  Comments: Checkout at 1145.   Dr. Kathie Dike is Medical Director for [facility] Pulmonary Rehab.

## 2021-09-10 ENCOUNTER — Ambulatory Visit: Payer: Medicare Other | Admitting: Podiatry

## 2021-09-10 NOTE — Progress Notes (Signed)
Carelink Summary Report / Loop Recorder 

## 2021-09-11 ENCOUNTER — Encounter: Payer: Self-pay | Admitting: Internal Medicine

## 2021-09-11 ENCOUNTER — Encounter (HOSPITAL_COMMUNITY)
Admission: RE | Admit: 2021-09-11 | Discharge: 2021-09-11 | Disposition: A | Discharge status: Home / Self Care | Payer: Medicare Other | Source: Ambulatory Visit | Attending: Internal Medicine | Admitting: Internal Medicine

## 2021-09-11 DIAGNOSIS — J4540 Moderate persistent asthma, uncomplicated: Secondary | ICD-10-CM

## 2021-09-11 LAB — SPECIMEN STATUS REPORT

## 2021-09-11 LAB — BASIC METABOLIC PANEL
BUN/Creatinine Ratio: 22 (ref 12–28)
BUN: 20 mg/dL (ref 8–27)
CO2: 20 mmol/L (ref 20–29)
Calcium: 9.2 mg/dL (ref 8.7–10.3)
Chloride: 107 mmol/L — ABNORMAL HIGH (ref 96–106)
Creatinine, Ser: 0.9 mg/dL (ref 0.57–1.00)
Glucose: 80 mg/dL (ref 70–99)
Potassium: 3.9 mmol/L (ref 3.5–5.2)
Sodium: 143 mmol/L (ref 134–144)
eGFR: 64 mL/min/{1.73_m2} (ref >59)

## 2021-09-11 LAB — MAGNESIUM: Magnesium: 2.3 mg/dL (ref 1.6–2.3)

## 2021-09-11 NOTE — Assessment & Plan Note (Signed)
Benefits from CPAP with improved sleep Plan-continue CPAP 9

## 2021-09-11 NOTE — Assessment & Plan Note (Signed)
Has had elevated eosinophils and IgE implicating an allergic component.  Recognize symptom overlap related to obesity and body habitus with hypoventilation, as well as history of CHF. Plan-pulmonary rehabilitation program at [facility]

## 2021-09-11 NOTE — Assessment & Plan Note (Signed)
Rhythm feels like NSR on exam at this visit.  Followed by cardiology.

## 2021-09-11 NOTE — Progress Notes (Signed)
Daily Session Note  Patient Details  Name: [name] [name] MRN: [id number] Date of Birth: February 29, 1941 Referring Provider:   Flowsheet Row PULMONARY REHAB OTHER RESP ORIENTATION from 09/02/2021 in [place]  Referring Provider Dr. Annamaria Boots       Encounter Date: 09/11/2021  Check In:  Session Check In - 09/11/21 1045       Check-In   Supervising physician immediately available to respond to emergencies [facility] MD immediately available    Physician(s) Dr Debara Pickett    Location [facility]    Staff Present Redge Gainer, BS, Exercise Physiologist;Deirdre Gryder Hassell Done, RN, Bjorn Loser, MS, ACSM-CEP, Exercise Physiologist    Virtual Visit No    Medication changes reported     No    Fall or balance concerns reported    No    Comments She has not fallen but walks with a cane for support. 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    Tobacco Cessation No Change    Warm-up and Cool-down Performed as group-led instruction    Resistance Training Performed Yes    VAD Patient? No    PAD/SET Patient? No      Pain Assessment   Currently in Pain? No/denies    Pain Score 0-No pain    Multiple Pain Sites No             Capillary Blood Glucose: No results found for this or any previous visit (from the past 24 hour(s)).    Social History   Tobacco Use  Smoking Status Never  Smokeless Tobacco Never    Goals Met:  Independence with exercise equipment Exercise tolerated well No report of concerns or symptoms today Strength training completed today  Goals Unmet:  Not Applicable  Comments: Checkout at 1145.   Dr. Kathie Dike is Medical Director for [facility] Pulmonary Rehab.

## 2021-09-12 NOTE — Progress Notes (Deleted)
Pulmonary Individual Treatment Plan  Patient Details  Name: [name] [name] MRN: [id number] Date of Birth: [birth year] Referring Provider:   Flowsheet Row PULMONARY REHAB OTHER RESP ORIENTATION from 09/02/2021 in [place]  Referring Provider Dr. Annamaria Boots       Initial Encounter Date:  Flowsheet Row PULMONARY REHAB OTHER RESP ORIENTATION from 09/02/2021 in [facility]  Date 09/02/21       Visit Diagnosis: Moderate persistent asthma, unspecified whether complicated  Patient's Home Medications on Admission:   Current Outpatient Medications:    acetaminophen (TYLENOL) 500 MG tablet, Take 500 mg by mouth every 8 (eight) hours as needed for headache., Disp: , Rfl:    antiseptic oral rinse (BIOTENE) LIQD, 15 mLs by Mouth Rinse route daily as needed for dry mouth., Disp: , Rfl:    atorvastatin (LIPITOR) 10 MG tablet, Take 10 mg by mouth daily at 6 PM., Disp: , Rfl:    benzonatate (TESSALON) 200 MG capsule, Take 1 capsule (200 mg total) by mouth 3 (three) times daily as needed for cough., Disp: 30 capsule, Rfl: 3   Biotin 1 MG CAPS, Take 1,000 mg by mouth 3 (three) times a week. , Disp: , Rfl:    budesonide (PULMICORT) 0.5 MG/2ML nebulizer solution, Take 0.5 mg by nebulization daily. Additional if needed during the day, Disp: , Rfl:    Calcium Carbonate-Vit D-Min (QC CALCIUM-MAGNESIUM-ZINC-D3) 333.4-133 MG-UNIT TABS, Take 1 tablet by mouth 2 (two) times daily. , Disp: , Rfl:    cephALEXin (KEFLEX) 500 MG capsule, Take 1 capsule (500 mg total) by mouth 3 (three) times daily., Disp: 30 capsule, Rfl: 0   cetirizine (ZYRTEC) 10 MG tablet, Take 10 mg by mouth at bedtime as needed for allergies., Disp: , Rfl:    clindamycin (CLEOCIN) 300 MG capsule, Take 600 mg by mouth See admin instructions. Take prior to dental procedures, Disp: , Rfl: 0   diltiazem (CARDIZEM CD) 120 MG 24 hr capsule, Take 1 capsule (120 mg total) by mouth every evening., Disp: , Rfl:     diltiazem (CARDIZEM CD) 240 MG 24 hr capsule, Take 1 capsule (240 mg total) by mouth every morning., Disp: 90 capsule, Rfl: 1   diltiazem (CARDIZEM) 30 MG tablet, TAKE 1 TABLET BY MOUTH AS NEEDED FOR PALPITATIONS, Disp: 45 tablet, Rfl: 2   dofetilide (TIKOSYN) 500 MCG capsule, TAKE ONE CAPSULE BY MOUTH TWICE A DAY, Disp: 180 capsule, Rfl: 1   EPINEPHrine 0.3 mg/0.3 mL IJ SOAJ injection, Inject 0.3 mg into the muscle as needed for anaphylaxis., Disp: , Rfl:    famotidine (PEPCID) 20 MG tablet, Take 2 tablets (40 mg total) by mouth at bedtime as needed., Disp: 30 tablet, Rfl: 1   fluticasone (FLONASE) 50 MCG/ACT nasal spray, Place 1 spray into both nostrils 2 (two) times daily., Disp: , Rfl:    hydrALAZINE (APRESOLINE) 100 MG tablet, Take 100 mg by mouth 2 (two) times daily., Disp: , Rfl:    HYDROcodone bit-homatropine (HYCODAN) 5-1.5 MG/5ML syrup, Take 5 mLs by mouth every 6 (six) hours as needed for cough., Disp: , Rfl:    Hypertonic Nasal Wash (SINUS RINSE) PACK, Place 1 each into the nose daily as needed (clear nasal passage). Neilmed sinus rinse, Disp: , Rfl:    ipratropium-albuterol (DUONEB) 0.5-2.5 (3) MG/3ML SOLN, Take 3 mLs by nebulization 4 (four) times daily as needed (Shortness of breathing)., Disp: , Rfl:    losartan (COZAAR) 100 MG tablet, TAKE 1 TABLET BY MOUTH DAILY, Disp:  90 tablet, Rfl: 3   Magnesium 250 MG TABS, Take 250 mg by mouth daily., Disp: , Rfl:    montelukast (SINGULAIR) 10 MG tablet, Take 10 mg by mouth at bedtime., Disp: , Rfl:    omeprazole (PRILOSEC) 40 MG capsule, Take 1 capsule (40 mg total) by mouth daily., Disp: 60 capsule, Rfl: 2   Polyethyl Glycol-Propyl Glycol (SYSTANE) 0.4-0.3 % GEL ophthalmic gel, Place 1 application. into both eyes daily as needed (Dry eye)., Disp: , Rfl:    Polyethyl Glycol-Propyl Glycol (SYSTANE) 0.4-0.3 % SOLN, Place 1 drop into both eyes daily as needed (Dry eye)., Disp: , Rfl:    potassium chloride SA (KLOR-CON M) 20 MEQ tablet, TAKE 1  TABLET BY MOUTH DAILY, Disp: 90 tablet, Rfl: 3   rivaroxaban (XARELTO) 20 MG TABS tablet, Take 1 tablet (20 mg total) by mouth daily with supper., Disp: 90 tablet, Rfl: 3   umeclidinium-vilanterol (ANORO ELLIPTA) 62.5-25 MCG/ACT AEPB, INHALE ONE PUFF INTO THE LUNGS DAILY (Patient taking differently: 1 puff every morning.), Disp: 60 each, Rfl: 11   Wheat Dextrin (BENEFIBER ON THE GO) POWD, Take 10 mLs by mouth daily., Disp: , Rfl:   Past Medical History: Past Medical History:  Diagnosis Date   Allergic rhinitis    Anxiety    Asthma    Atrial flutter (HCC)    COPD (chronic obstructive pulmonary disease) (HCC)    Depression    Essential hypertension    Hyperlipidemia    Lumbar disc disease    Obesity    OSA (obstructive sleep apnea)    CPAP   Paroxysmal atrial fibrillation (HCC)    Element of tachycardia bradycardia syndrome   Respiratory failure (HCC)     Tobacco Use: Social History   Tobacco Use  Smoking Status Never  Smokeless Tobacco Never    Labs: Review Flowsheet       Latest Ref Rng & Units 11/26/2020  Labs for ITP Cardiac and Pulmonary Rehab  TCO2 22 - 32 mmol/L 23     Capillary Blood Glucose: No results found for: "GLUCAP"   Pulmonary Assessment Scores:  Pulmonary Assessment Scores     Row Name 09/02/21 0824         ADL UCSD   ADL Phase Entry     SOB Score total 82     Rest 1     Walk 3     Stairs 5     Bath 4     Dress 3     Shop 3       CAT Score   CAT Score 15       mMRC Score   mMRC Score 3             UCSD: Self-administered rating of dyspnea associated with activities of daily living (ADLs) 6-point scale (0 = "not at all" to 5 = "maximal or unable to do because of breathlessness")  Scoring Scores range from 0 to 120.  Minimally important difference is 5 units  CAT: CAT can identify the health impairment of COPD patients and is better correlated with disease progression.  CAT has a scoring range of zero to 40. The CAT score is  classified into four groups of low (less than 10), medium (10 - 20), high (21-30) and very high (31-40) based on the impact level of disease on health status. A CAT score over 10 suggests significant symptoms.  A worsening CAT score could be explained by an exacerbation, poor medication adherence, poor  inhaler technique, or progression of COPD or comorbid conditions.  CAT MCID is 2 points  mMRC: mMRC (Modified Medical Research Council) Dyspnea Scale is used to assess the degree of baseline functional disability in patients of respiratory disease due to dyspnea. No minimal important difference is established. A decrease in score of 1 point or greater is considered a positive change.   Pulmonary Function Assessment:   Exercise Target Goals: Exercise Program Goal: Individual exercise prescription set using results from initial 6 min walk test and THRR while considering  patient's activity barriers and safety.   Exercise Prescription Goal: Initial exercise prescription builds to 30-45 minutes a day of aerobic activity, 2-3 days per week.  Home exercise guidelines will be given to patient during program as part of exercise prescription that the participant will acknowledge.  Activity Barriers & Risk Stratification:  Activity Barriers & Cardiac Risk Stratification - 09/02/21 0852       Activity Barriers & Cardiac Risk Stratification   Activity Barriers Right Hip Replacement;Left Hip Replacement;Arthritis;Back Problems;Joint Problems;Deconditioning;Shortness of Breath;Balance Concerns;Other (comment)    Comments hammer toes, atrial fibrillation    Cardiac Risk Stratification Moderate             6 Minute Walk:  6 Minute Walk     Row Name 09/02/21 0851         6 Minute Walk   Phase Initial     Distance 750 feet     Walk Time 6 minutes     # of Rest Breaks 0     MPH 1.42     METS 1.22     RPE 13     Perceived Dyspnea  15     VO2 Peak 4.29     Symptoms No     Resting HR 59 bpm      Resting BP 148/62     Resting Oxygen Saturation  98 %     Exercise Oxygen Saturation  during 6 min walk 96 %     Max Ex. HR 120 bpm     Max Ex. BP 158/60     2 Minute Post BP 120/56       Interval HR   1 Minute HR 110     2 Minute HR 109     3 Minute HR 119     4 Minute HR 116     5 Minute HR 116     6 Minute HR 120     2 Minute Post HR 68     Interval Heart Rate? Yes       Interval Oxygen   Interval Oxygen? Yes     Baseline Oxygen Saturation % 98 %     1 Minute Oxygen Saturation % 97 %     1 Minute Liters of Oxygen 0 L     2 Minute Oxygen Saturation % 97 %     2 Minute Liters of Oxygen 0 L     3 Minute Oxygen Saturation % 96 %     3 Minute Liters of Oxygen 0 L     4 Minute Oxygen Saturation % 97 %     4 Minute Liters of Oxygen 0 L     5 Minute Oxygen Saturation % 97 %     5 Minute Liters of Oxygen 0 L     6 Minute Oxygen Saturation % 97 %     6 Minute Liters of Oxygen 0 L     2 Minute  Post Oxygen Saturation % 98 %     2 Minute Post Liters of Oxygen 0 L              Oxygen Initial Assessment:  Oxygen Initial Assessment - 09/02/21 0921       Initial 6 min Walk   Oxygen Used None      Program Oxygen Prescription   Program Oxygen Prescription None      Intervention   Short Term Goals To learn and understand importance of maintaining oxygen saturations>88%;To learn and understand importance of monitoring SPO2 with pulse oximeter and demonstrate accurate use of the pulse oximeter.;To learn and demonstrate proper pursed lip breathing techniques or other breathing techniques.     Long  Term Goals Maintenance of O2 saturations>88%;Verbalizes importance of monitoring SPO2 with pulse oximeter and return demonstration;Exhibits proper breathing techniques, such as pursed lip breathing or other method taught during program session             Oxygen Re-Evaluation:   Oxygen Discharge (Final Oxygen Re-Evaluation):   Initial Exercise Prescription:  Initial  Exercise Prescription - 09/02/21 0800       Date of Initial Exercise RX and Referring Provider   Date 09/02/21    Referring Provider Dr. Annamaria Boots    Expected Discharge Date 01/06/22      Treadmill   MPH 1    Grade 0    Minutes 17      NuStep   Level 1    SPM 70    Minutes 22      Prescription Details   Frequency (times per week) 2    Duration Progress to 30 minutes of continuous aerobic without signs/symptoms of physical distress      Intensity   THRR 40-80% of Max Heartrate 56-111    Ratings of Perceived Exertion 11-13    Perceived Dyspnea 0-4      Resistance Training   Training Prescription Yes    Weight 2    Reps 10-15             Perform Capillary Blood Glucose checks as needed.  Exercise Prescription Changes:   Exercise Comments:   Exercise Goals and Review:   Exercise Goals     Row Name 09/02/21 0854             Exercise Goals   Increase Physical Activity Yes       Intervention Provide advice, education, support and counseling about physical activity/exercise needs.;Develop an individualized exercise prescription for aerobic and resistive training based on initial evaluation findings, risk stratification, comorbidities and participant's personal goals.       Expected Outcomes Short Term: Attend rehab on a regular basis to increase amount of physical activity.;Long Term: Add in home exercise to make exercise part of routine and to increase amount of physical activity.;Long Term: Exercising regularly at least 3-5 days a week.       Increase Strength and Stamina Yes       Intervention Provide advice, education, support and counseling about physical activity/exercise needs.;Develop an individualized exercise prescription for aerobic and resistive training based on initial evaluation findings, risk stratification, comorbidities and participant's personal goals.       Expected Outcomes Short Term: Increase workloads from initial exercise prescription for  resistance, speed, and METs.;Short Term: Perform resistance training exercises routinely during rehab and add in resistance training at home;Long Term: Improve cardiorespiratory fitness, muscular endurance and strength as measured by increased METs and functional capacity (6MWT)  Able to understand and use rate of perceived exertion (RPE) scale Yes       Intervention Provide education and explanation on how to use RPE scale       Expected Outcomes Short Term: Able to use RPE daily in rehab to express subjective intensity level;Long Term:  Able to use RPE to guide intensity level when exercising independently       Able to understand and use Dyspnea scale Yes       Intervention Provide education and explanation on how to use Dyspnea scale       Expected Outcomes Short Term: Able to use Dyspnea scale daily in rehab to express subjective sense of shortness of breath during exertion;Long Term: Able to use Dyspnea scale to guide intensity level when exercising independently       Knowledge and understanding of Target Heart Rate Range (THRR) Yes       Intervention Provide education and explanation of THRR including how the numbers were predicted and where they are located for reference       Expected Outcomes Long Term: Able to use THRR to govern intensity when exercising independently;Short Term: Able to use daily as guideline for intensity in rehab;Short Term: Able to state/look up THRR       Understanding of Exercise Prescription Yes       Intervention Provide education, explanation, and written materials on patient's individual exercise prescription       Expected Outcomes Short Term: Able to explain program exercise prescription;Long Term: Able to explain home exercise prescription to exercise independently                Exercise Goals Re-Evaluation :   Discharge Exercise Prescription (Final Exercise Prescription Changes):   Nutrition:  Target Goals: Understanding of nutrition  guidelines, daily intake of sodium '1500mg'$ , cholesterol '200mg'$ , calories 30% from fat and 7% or less from saturated fats, daily to have 5 or more servings of fruits and vegetables.  Biometrics:  Pre Biometrics - 09/02/21 0855       Pre Biometrics   Height 5' 1.5" (1.562 m)    Weight 89.2 kg    Waist Circumference 46 inches    Hip Circumference 47 inches    Waist to Hip Ratio 0.98 %    BMI (Calculated) 36.56    Triceps Skinfold 8 mm    % Body Fat 42.7 %    Grip Strength 24.8 kg    Flexibility 0 in    Single Leg Stand 0 seconds              Nutrition Therapy Plan and Nutrition Goals:  Nutrition Therapy & Goals - 09/02/21 0826       Personal Nutrition Goals   Nutrition Goal For heart healthy choices add >50% of whole grains, make half their plate fruits and vegetables. Discuss the difference between starchy vegetables and leafy greens, and how leafy vegetables provide fiber, helps maintain healthy weight, helps control blood glucose, and lowers cholesterol.  Discuss purchasing fresh or frozen vegetable to reduce sodium and not to add grease, fat or sugar. Consume <18oz of red meat per week. Consume lean cuts of meats and very little of meats high in sodium and nitrates such as pork and lunch meats. Discussed portion control for all food groups.        Intervention Plan   Intervention Nutrition handout(s) given to patient.    Expected Outcomes Short Term Goal: Understand basic principles of dietary content, such as  calories, fat, sodium, cholesterol and nutrients.             Nutrition Assessments:  Nutrition Assessments - 09/02/21 0827       MEDFICTS Scores   Pre Score 71            MEDIFICTS Score Key: ?70 Need to make dietary changes  40-70 Heart Healthy Diet ? 40 Therapeutic Level Cholesterol Diet   Picture Your Plate Scores: <60 Unhealthy dietary pattern with much room for improvement. 41-50 Dietary pattern unlikely to meet recommendations for good  health and room for improvement. 51-60 More healthful dietary pattern, with some room for improvement.  >60 Healthy dietary pattern, although there may be some specific behaviors that could be improved.    Nutrition Goals Re-Evaluation:   Nutrition Goals Discharge (Final Nutrition Goals Re-Evaluation):   Psychosocial: Target Goals: Acknowledge presence or absence of significant depression and/or stress, maximize coping skills, provide positive support system. Participant is able to verbalize types and ability to use techniques and skills needed for reducing stress and depression.  Initial Review & Psychosocial Screening:  Initial Psych Review & Screening - 09/02/21 0854       Initial Review   Current issues with None Identified      Family Dynamics   Good Support System? Yes    Comments Her support system includes her two nieces and the members of her church.      Barriers   Psychosocial barriers to participate in program There are no identifiable barriers or psychosocial needs.      Screening Interventions   Interventions Encouraged to exercise    Expected Outcomes Long Term goal: The participant improves quality of Life and PHQ9 Scores as seen by post scores and/or verbalization of changes;Short Term goal: Identification and review with participant of any Quality of Life or Depression concerns found by scoring the questionnaire.             Quality of Life Scores:  Quality of Life - 09/02/21 0856       Quality of Life   Select Quality of Life      Quality of Life Scores   Health/Function Pre 23.2 %    Socioeconomic Pre 26.08 %    Psych/Spiritual Pre 24.64 %    Family Pre 23.5 %    GLOBAL Pre 24.11 %            Scores of 19 and below usually indicate a poorer quality of life in these areas.  A difference of  2-3 points is a clinically meaningful difference.  A difference of 2-3 points in the total score of the Quality of Life Index has been associated with  significant improvement in overall quality of life, self-image, physical symptoms, and general health in studies assessing change in quality of life.   PHQ-9: Review Flowsheet       09/02/2021 03/18/2018 10/01/2017  Depression screen PHQ 2/9  Decreased Interest 0 0 0  Down, Depressed, Hopeless 0 0 0  PHQ - 2 Score 0 0 0  Altered sleeping '2 1 2  '$ Tired, decreased energy '2 2 2  '$ Change in appetite 0 2 2  Feeling bad or failure about yourself  0 0 0  Trouble concentrating 0 2 0  Moving slowly or fidgety/restless 0 0 0  Suicidal thoughts 0 0 0  PHQ-9 Score '4 7 6  '$ Difficult doing work/chores Somewhat difficult Not difficult at all Not difficult at all   Interpretation of Total Score  Total Score Depression Severity:  1-4 = Minimal depression, 5-9 = Mild depression, 10-14 = Moderate depression, 15-19 = Moderately severe depression, 20-27 = Severe depression   Psychosocial Evaluation and Intervention:  Psychosocial Evaluation - 09/02/21 0922       Psychosocial Evaluation & Interventions   Interventions Encouraged to exercise with the program and follow exercise prescription    Comments Pt has no barriers to participating in PR. She has no identifiable psychsocial issues. She scored a 4 on her PHQ-9 and this relates to her sleeping too much due to her lack of energy. She believes that this is due to her chronic lung condition and her atrial fibrillation. She reports that she took antidepressants about 15 years ago when her mother passed away, but she did not like the way that the medications made her feel, and that she would not consider taking these medications again. She is also participating in PT twice per week with the hopes that she will be able to strengthen her body. She reports that her support system includes her two nieces and her church family. Her goals while in the program are to decrease her SOB with ADL's, improve her energy levels, and to be able to walk longer distances. She  specifically states that she would like to be able to take a 30 minute walk without stopping. She has a positive mindset and is eager to start the program. She previously participated in [place] in 2019, and she felt like it helped her a lot then.    Expected Outcomes Pt will continue to have no identifiable psychosocial issues.    Continue Psychosocial Services  No Follow up required             Psychosocial Re-Evaluation:  Psychosocial Re-Evaluation     [place] Name 09/08/21 1441 09/08/21 1455           Psychosocial Re-Evaluation   Current issues with -- None Identified      Comments Patient is on session 2 and is tolerating the program well.  Patient has had one educational session on stress management.  We will continue to monitor. Patient is on session 1 and is tolerating the program well.  Patient has had one educational session on stress management.  We will continue to monitor.      Expected Outcomes Patient will have no psychosocial issues identified at discharge.  Patient will have no psychosocial issues identified at discharge.       Interventions Relaxation education;Encouraged to attend Pulmonary Rehabilitation for the exercise;Stress management education Relaxation education;Encouraged to attend Pulmonary Rehabilitation for the exercise;Stress management education      Continue Psychosocial Services  No Follow up required No Follow up required               Psychosocial Discharge (Final Psychosocial Re-Evaluation):  Psychosocial Re-Evaluation - 09/08/21 1455       Psychosocial Re-Evaluation   Current issues with None Identified    Comments Patient is on session 1 and is tolerating the program well.  Patient has had one educational session on stress management.  We will continue to monitor.    Expected Outcomes Patient will have no psychosocial issues identified at discharge.     Interventions Relaxation education;Encouraged to attend Pulmonary Rehabilitation for the  exercise;Stress management education    Continue Psychosocial Services  No Follow up required              Education: Education Goals: Education classes will be provided on  a weekly basis, covering required topics. Participant will state understanding/return demonstration of topics presented.  Learning Barriers/Preferences:  Learning Barriers/Preferences - 09/02/21 0857       Learning Barriers/Preferences   Learning Barriers None    Learning Preferences Skilled Demonstration;Individual Instruction;Group Instruction             Education Topics: How Lungs Work and Diseases: - Discuss the anatomy of the lungs and diseases that can affect the lungs, such as COPD. Flowsheet Row PULMONARY REHAB OTHER RESPIRATORY from 03/10/2018 in [place] [facility]  Date 11/04/17  Educator [place]  Instruction Review Code 2- Demonstrated Understanding       Exercise: -Discuss the importance of exercise, FITT principles of exercise, normal and abnormal responses to exercise, and how to exercise safely.   Environmental Irritants: -Discuss types of environmental irritants and how to limit exposure to environmental irritants. Flowsheet Row PULMONARY REHAB OTHER RESPIRATORY from 03/10/2018 in [place]  Date 11/11/17  Educator DWynetta Emery  Instruction Review Code 2- Demonstrated Understanding       Meds/Inhalers and oxygen: - Discuss respiratory medications, definition of an inhaler and oxygen, and the proper way to use an inhaler and oxygen. Flowsheet Row PULMONARY REHAB OTHER RESPIRATORY from 03/10/2018 in [place]  Date 11/18/17  Educator D. Wynetta Emery       Energy Saving Techniques: - Discuss methods to conserve energy and decrease shortness of breath when performing activities of daily living.  Flowsheet Row PULMONARY REHAB OTHER RESPIRATORY from 03/10/2018 in [place]  Date 11/25/17  Educator  Etheleen Mayhew  Instruction Review Code 2- Demonstrated Understanding       Bronchial Hygiene / Breathing Techniques: - Discuss breathing mechanics, pursed-lip breathing technique,  proper posture, effective ways to clear airways, and other functional breathing techniques   Cleaning Equipment: - Provides group verbal and written instruction about the health risks of elevated stress, cause of high stress, and healthy ways to reduce stress. Flowsheet Row PULMONARY REHAB OTHER RESPIRATORY from 03/10/2018 in [place]  Date 03/10/18  Educator [place]  Instruction Review Code 2- Demonstrated Understanding       Nutrition I: Fats: - Discuss the types of cholesterol, what cholesterol does to the body, and how cholesterol levels can be controlled. Flowsheet Row PULMONARY REHAB OTHER RESPIRATORY from 03/10/2018 in [place]  Date 12/16/17  Educator Etheleen Mayhew  Instruction Review Code 2- Demonstrated Understanding       Nutrition II: Labels: -Discuss the different components of food labels and how to read food labels. Flowsheet Row PULMONARY REHAB OTHER RESPIRATORY from 03/10/2018 in [place]  Date 12/23/17  Educator Etheleen Mayhew  Instruction Review Code 2- Demonstrated Understanding       Respiratory Infections: - Discuss the signs and symptoms of respiratory infections, ways to prevent respiratory infections, and the importance of seeking medical treatment when having a respiratory infection. Flowsheet Row PULMONARY REHAB OTHER RESPIRATORY from 03/10/2018 in [place]  Date 01/06/18  Educator Etheleen Mayhew  Instruction Review Code 2- Demonstrated Understanding       Stress I: Signs and Symptoms: - Discuss the causes of stress, how stress may lead to anxiety and depression, and ways to limit stress. Flowsheet Row PULMONARY REHAB OTHER RESPIRATORY from 09/04/2021 in [place]  Date 09/03/21  Educator [place]  Instruction Review Code 1- Verbalizes Understanding       Stress II: Relaxation: -Discuss  relaxation techniques to limit stress. Flowsheet Row PULMONARY REHAB OTHER RESPIRATORY from 09/11/2021 in [place]  Date 09/11/21  Instruction Review Code 1- Verbalizes Understanding       Oxygen for Home/Travel: - Discuss how to prepare for travel when on oxygen and proper ways to transport and store oxygen to ensure safety.   Knowledge Questionnaire Score:  Knowledge Questionnaire Score - 09/02/21 0824       Knowledge Questionnaire Score   Pre Score 13/18             Core Components/Risk Factors/Patient Goals at Admission:  Personal Goals and Risk Factors at Admission - 09/02/21 0900       Core Components/Risk Factors/Patient Goals on Admission    Weight Management Yes;Obesity;Weight Loss    Intervention Weight Management: Develop a combined nutrition and exercise program designed to reach desired caloric intake, while maintaining appropriate intake of nutrient and fiber, sodium and fats, and appropriate energy expenditure required for the weight goal.;Weight Management: Provide education and appropriate resources to help participant work on and attain dietary goals.;Weight Management/Obesity: Establish reasonable short term and long term weight goals.;Obesity: Provide education and appropriate resources to help participant work on and attain dietary goals.    Expected Outcomes Short Term: Continue to assess and modify interventions until short term weight is achieved;Long Term: Adherence to nutrition and physical activity/exercise program aimed toward attainment of established weight goal;Weight Maintenance: Understanding of the daily nutrition guidelines, which includes 25-35% calories from fat, 7% or less cal from saturated fats, less than '200mg'$  cholesterol, less than 1.5gm of sodium, & 5 or more servings of fruits  and vegetables daily;Weight Loss: Understanding of general recommendations for a balanced deficit meal plan, which promotes 1-2 lb weight loss per week and includes a negative energy balance of [phone] kcal/d;Understanding recommendations for meals to include 15-35% energy as protein, 25-35% energy from fat, 35-60% energy from carbohydrates, less than '200mg'$  of dietary cholesterol, 20-35 gm of total fiber daily;Understanding of distribution of calorie intake throughout the day with the consumption of 4-5 meals/snacks    Improve shortness of breath with ADL's Yes    Intervention Provide education, individualized exercise plan and daily activity instruction to help decrease symptoms of SOB with activities of daily living.    Expected Outcomes Short Term: Improve cardiorespiratory fitness to achieve a reduction of symptoms when performing ADLs;Long Term: Be able to perform more ADLs without symptoms or delay the onset of symptoms    Personal Goal Other Yes    Personal Goal She wants to improve her energy level and to be able to walk longer distances.    Intervention Attend CR 3 x week and supplement with at home exercise 2 x week.     Expected Outcomes Achieve personal goals.              Core Components/Risk Factors/Patient Goals Review:   Goals and Risk Factor Review     Row Name 09/08/21 1445             Core Components/Risk Factors/Patient Goals Review   Review Patient completed 1 session. She has completed this program in the passed and is doing well in this program. She has recently had chronic hip pain which limitions.  She  says the program helped her alot. She has more strength and stamina now with increased energy.  She plans to continue exercising at the [facility] and by walking with some friends. We will continue to monitor.  Expected Outcomes Patient will  complete the program and meet her personal goals.                Core Components/Risk Factors/Patient Goals at  Discharge (Final Review):   Goals and Risk Factor Review - 09/08/21 1445       Core Components/Risk Factors/Patient Goals Review   Review Patient completed 1 session. She has completed this program in the passed and is doing well in this program. She has recently had chronic hip pain which limitions.  She  says the program helped her alot. She has more strength and stamina now with increased energy.  She plans to continue exercising at the [facility] and by walking with some friends. We will continue to monitor.    Expected Outcomes Patient will  complete the program and meet her personal goals.             ITP Comments:   Comments: ITP REVIEW Pt is making expected progress toward pulmonary rehab goals after completing 4 sessions. Recommend continued exercise, life style modification, education, and utilization of breathing techniques to increase stamina and strength and decrease shortness of breath with exertion.

## 2021-09-14 NOTE — Progress Notes (Signed)
Subjective:  Patient ID: [name] [name], female    DOB: 1940/09/06,  MRN: [id number]  [name] [name] presents to clinic today for at risk foot care with history of diabetic neuropathy and callus(es) b/l lower extremities and painful thick toenails that are difficult to trim. Painful toenails interfere with ambulation. Aggravating factors include wearing enclosed shoe gear. Pain is relieved with periodic professional debridement. Painful calluses are aggravated when weightbearing with and without shoegear. Pain is relieved with periodic professional debridement.  New problem(s): None.   PCP is Monico Blitz, MD , and last visit was May 14, 2021.   Patient states she broke some glass a couple of days ago and thought she swept it all up. She had a palpable shard on her left great toe. She denies any pain, redness or swelling of the digit.  Allergies  Allergen Reactions   Bupropion Hcl Other (See Comments)    Suicidal Thoughts.    Escitalopram Oxalate Other (See Comments)    Suicidal Thoughts.    Fluticasone-Salmeterol Other (See Comments)    Advair - Caused patient to go into Afib.    Lisinopril Cough   Serevent Other (See Comments)    Caused patient to go into Afib    Review of Systems: Negative except as noted in the HPI.  Objective: No changes noted in today's physical examination.     Constitutional [name] [name] is a pleasant 81 y.o. Caucasian female, obese in NAD. AAO x 3.   Vascular CFT immediate b/l LE. Palpable DP/PT pulses b/l LE. Digital hair sparse b/l. Skin temperature gradient WNL b/l. No pain with calf compression b/l. Trace edema b/l LE. No cyanosis or clubbing noted b/l LE.  Neurologic Normal speech. Oriented to person, place, and time. Pt has subjective symptoms of neuropathy. Protective sensation intact 5/5 intact bilaterally with 10g monofilament b/l.  Dermatologic Pedal integument with normal turgor, texture and tone b/l LE. No open wounds b/l.  No interdigital macerations b/l. Toenails 1-5 b/l elongated, thickened, discolored with subungual debris. +Tenderness with dorsal palpation of nailplates. Hyperkeratotic lesion(s) submet head 1 b/l.  No erythema, no edema, no drainage, no fluctuance.   Foreign body object protruding from medial IPJ of left hallux. No surrounding erythema, no edema, no drainage, no fluctuance.  Orthopedic: Normal muscle strength 5/5 to all lower extremity muscle groups bilaterally. 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 Pes planus deformity noted bilateral LE.Marland Kitchen ROM limited due to fusion of left 1st MPJ.  Patient ambulates independently without assistive aids.   Xray findings left foot:  No gas in tissues left foot. Hardware intact 1st MPJ fusion. No evidence of fracture left foot. Pes planus foot deformity left foot. No foreign body evident left foot.   1. Pain due to onychomycosis of toenails of both feet   2. Other foreign body or object entering through skin, initial encounter   3. Callus   4. Neuropathic pain   5. Normocytic anemia     -Patient was evaluated and treated. All patient's and/or POA's questions/concerns answered on today's visit. -Left hallux prepped with betadine solution. Shard of glass excised without complication. Post removal foreign body xray taken today. Area dressed with triple antibiotic ointment and band-aid. -Discussed foreign body left foot. She is to check tetanus status with her PCP. Instructions for local wound care dispensed. -Mycotic toenails 1-5 bilaterally were debrided in length and girth with sterile nail nippers and dremel without incident. -Callus(es) submet head 1 b/l pared utilizing sterile scalpel blade without complication  or incident. Total number debrided =2. -Xray of left foot was performed and reviewed with patient and/or POA. -Rx for Keflex 500 mg po, #30, to be taken one capsule three times per day for 10 days. -Patient/POA to call should there be  question/concern in the interim.   Return in about 9 weeks (around 11/10/2021).  Marzetta Board, DPM

## 2021-09-16 ENCOUNTER — Encounter (HOSPITAL_COMMUNITY)
Admission: RE | Admit: 2021-09-16 | Discharge: 2021-09-16 | Disposition: A | Discharge status: Home / Self Care | Payer: Medicare Other | Source: Ambulatory Visit | Attending: Internal Medicine | Admitting: Internal Medicine

## 2021-09-16 VITALS — Wt 198.9 lb

## 2021-09-16 DIAGNOSIS — J4540 Moderate persistent asthma, uncomplicated: Secondary | ICD-10-CM

## 2021-09-16 NOTE — Progress Notes (Signed)
Daily Session Note  Patient Details  Name: [name] [name] MRN: [id number] Date of Birth: Mar 06, 1941 Referring Provider:   Flowsheet Row PULMONARY REHAB OTHER RESP ORIENTATION from 09/02/2021 in [place]  Referring Provider Dr. Annamaria Boots       Encounter Date: 09/16/2021  Check In:  Session Check In - 09/16/21 1038       Check-In   Supervising physician immediately available to respond to emergencies [facility] MD immediately available    Physician(s) Dr Johney Frame    Location [facility]    Staff Present Redge Gainer, BS, Exercise Physiologist;Sahand Gosch Hassell Done, RN, Bjorn Loser, MS, ACSM-CEP, Exercise Physiologist    Virtual Visit No    Fall or balance concerns reported    No    Comments She has not fallen but walks with a cane for support. 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    Tobacco Cessation No Change    Warm-up and Cool-down Performed as group-led instruction    Resistance Training Performed Yes    VAD Patient? No    PAD/SET Patient? No      Pain Assessment   Currently in Pain? No/denies    Pain Score 0-No pain    Multiple Pain Sites No             Capillary Blood Glucose: No results found for this or any previous visit (from the past 24 hour(s)).    Social History   Tobacco Use  Smoking Status Never  Smokeless Tobacco Never    Goals Met:  Independence with exercise equipment Exercise tolerated well No report of concerns or symptoms today Strength training completed today  Goals Unmet:  Not Applicable  Comments: checkout at 1145.   Dr. Kathie Dike is Medical Director for [facility].

## 2021-09-17 NOTE — Progress Notes (Signed)
Pulmonary Individual Treatment Plan  Patient Details  Name: [name] [name] MRN: [id number] Date of Birth: 10-02-1940 Referring Provider:   Flowsheet Row PULMONARY REHAB OTHER RESP ORIENTATION from 09/02/2021 in [place]  Referring Provider Dr. Annamaria Boots       Initial Encounter Date:  Flowsheet Row PULMONARY REHAB OTHER RESP ORIENTATION from 09/02/2021 in [place]  Date 09/02/21       Visit Diagnosis: Moderate persistent asthma, unspecified whether complicated  Patient's Home Medications on Admission:   Current Outpatient Medications:    acetaminophen (TYLENOL) 500 MG tablet, Take 500 mg by mouth every 8 (eight) hours as needed for headache., Disp: , Rfl:    antiseptic oral rinse (BIOTENE) LIQD, 15 mLs by Mouth Rinse route daily as needed for dry mouth., Disp: , Rfl:    atorvastatin (LIPITOR) 10 MG tablet, Take 10 mg by mouth daily at 6 PM., Disp: , Rfl:    benzonatate (TESSALON) 200 MG capsule, Take 1 capsule (200 mg total) by mouth 3 (three) times daily as needed for cough., Disp: 30 capsule, Rfl: 3   Biotin 1 MG CAPS, Take 1,000 mg by mouth 3 (three) times a week. , Disp: , Rfl:    budesonide (PULMICORT) 0.5 MG/2ML nebulizer solution, Take 0.5 mg by nebulization daily. Additional if needed during the day, Disp: , Rfl:    Calcium Carbonate-Vit D-Min (QC CALCIUM-MAGNESIUM-ZINC-D3) 333.4-133 MG-UNIT TABS, Take 1 tablet by mouth 2 (two) times daily. , Disp: , Rfl:    cephALEXin (KEFLEX) 500 MG capsule, Take 1 capsule (500 mg total) by mouth 3 (three) times daily., Disp: 30 capsule, Rfl: 0   cetirizine (ZYRTEC) 10 MG tablet, Take 10 mg by mouth at bedtime as needed for allergies., Disp: , Rfl:    clindamycin (CLEOCIN) 300 MG capsule, Take 600 mg by mouth See admin instructions. Take prior to dental procedures, Disp: , Rfl: 0   diltiazem (CARDIZEM CD) 120 MG 24 hr capsule, Take 1 capsule (120 mg total) by mouth every evening., Disp: , Rfl:     diltiazem (CARDIZEM CD) 240 MG 24 hr capsule, Take 1 capsule (240 mg total) by mouth every morning., Disp: 90 capsule, Rfl: 1   diltiazem (CARDIZEM) 30 MG tablet, TAKE 1 TABLET BY MOUTH AS NEEDED FOR PALPITATIONS, Disp: 45 tablet, Rfl: 2   dofetilide (TIKOSYN) 500 MCG capsule, TAKE ONE CAPSULE BY MOUTH TWICE A DAY, Disp: 180 capsule, Rfl: 1   EPINEPHrine 0.3 mg/0.3 mL IJ SOAJ injection, Inject 0.3 mg into the muscle as needed for anaphylaxis., Disp: , Rfl:    famotidine (PEPCID) 20 MG tablet, Take 2 tablets (40 mg total) by mouth at bedtime as needed., Disp: 30 tablet, Rfl: 1   fluticasone (FLONASE) 50 MCG/ACT nasal spray, Place 1 spray into both nostrils 2 (two) times daily., Disp: , Rfl:    hydrALAZINE (APRESOLINE) 100 MG tablet, Take 100 mg by mouth 2 (two) times daily., Disp: , Rfl:    HYDROcodone bit-homatropine (HYCODAN) 5-1.5 MG/5ML syrup, Take 5 mLs by mouth every 6 (six) hours as needed for cough., Disp: , Rfl:    Hypertonic Nasal Wash (SINUS RINSE) PACK, Place 1 each into the nose daily as needed (clear nasal passage). Neilmed sinus rinse, Disp: , Rfl:    ipratropium-albuterol (DUONEB) 0.5-2.5 (3) MG/3ML SOLN, Take 3 mLs by nebulization 4 (four) times daily as needed (Shortness of breathing)., Disp: , Rfl:    losartan (COZAAR) 100 MG tablet, TAKE 1 TABLET BY MOUTH DAILY, Disp:  90 tablet, Rfl: 3   Magnesium 250 MG TABS, Take 250 mg by mouth daily., Disp: , Rfl:    montelukast (SINGULAIR) 10 MG tablet, Take 10 mg by mouth at bedtime., Disp: , Rfl:    omeprazole (PRILOSEC) 40 MG capsule, Take 1 capsule (40 mg total) by mouth daily., Disp: 60 capsule, Rfl: 2   Polyethyl Glycol-Propyl Glycol (SYSTANE) 0.4-0.3 % GEL ophthalmic gel, Place 1 application. into both eyes daily as needed (Dry eye)., Disp: , Rfl:    Polyethyl Glycol-Propyl Glycol (SYSTANE) 0.4-0.3 % SOLN, Place 1 drop into both eyes daily as needed (Dry eye)., Disp: , Rfl:    potassium chloride SA (KLOR-CON M) 20 MEQ tablet, TAKE 1  TABLET BY MOUTH DAILY, Disp: 90 tablet, Rfl: 3   rivaroxaban (XARELTO) 20 MG TABS tablet, Take 1 tablet (20 mg total) by mouth daily with supper., Disp: 90 tablet, Rfl: 3   umeclidinium-vilanterol (ANORO ELLIPTA) 62.5-25 MCG/ACT AEPB, INHALE ONE PUFF INTO THE LUNGS DAILY (Patient taking differently: 1 puff every morning.), Disp: 60 each, Rfl: 11   Wheat Dextrin (BENEFIBER ON THE GO) POWD, Take 10 mLs by mouth daily., Disp: , Rfl:   Past Medical History: Past Medical History:  Diagnosis Date   Allergic rhinitis    Anxiety    Asthma    Atrial flutter (HCC)    COPD (chronic obstructive pulmonary disease) (HCC)    Depression    Essential hypertension    Hyperlipidemia    Lumbar disc disease    Obesity    OSA (obstructive sleep apnea)    CPAP   Paroxysmal atrial fibrillation (HCC)    Element of tachycardia bradycardia syndrome   Respiratory failure (HCC)     Tobacco Use: Social History   Tobacco Use  Smoking Status Never  Smokeless Tobacco Never    Labs: Review Flowsheet       Latest Ref Rng & Units 11/26/2020  Labs for ITP Cardiac and Pulmonary Rehab  TCO2 22 - 32 mmol/L 23     Capillary Blood Glucose: No results found for: "GLUCAP"   Pulmonary Assessment Scores:  Pulmonary Assessment Scores     Row Name 09/02/21 0824         ADL UCSD   ADL Phase Entry     SOB Score total 82     Rest 1     Walk 3     Stairs 5     Bath 4     Dress 3     Shop 3       CAT Score   CAT Score 15       mMRC Score   mMRC Score 3             UCSD: Self-administered rating of dyspnea associated with activities of daily living (ADLs) 6-point scale (0 = "not at all" to 5 = "maximal or unable to do because of breathlessness")  Scoring Scores range from 0 to 120.  Minimally important difference is 5 units  CAT: CAT can identify the health impairment of COPD patients and is better correlated with disease progression.  CAT has a scoring range of zero to 40. The CAT score is  classified into four groups of low (less than 10), medium (10 - 20), high (21-30) and very high (31-40) based on the impact level of disease on health status. A CAT score over 10 suggests significant symptoms.  A worsening CAT score could be explained by an exacerbation, poor medication adherence, poor  inhaler technique, or progression of COPD or comorbid conditions.  CAT MCID is 2 points  mMRC: mMRC (Modified Medical Research Council) Dyspnea Scale is used to assess the degree of baseline functional disability in patients of respiratory disease due to dyspnea. No minimal important difference is established. A decrease in score of 1 point or greater is considered a positive change.   Pulmonary Function Assessment:   Exercise Target Goals: Exercise Program Goal: Individual exercise prescription set using results from initial 6 min walk test and THRR while considering  patient's activity barriers and safety.   Exercise Prescription Goal: Initial exercise prescription builds to 30-45 minutes a day of aerobic activity, 2-3 days per week.  Home exercise guidelines will be given to patient during program as part of exercise prescription that the participant will acknowledge.  Activity Barriers & Risk Stratification:  Activity Barriers & Cardiac Risk Stratification - 09/02/21 0852       Activity Barriers & Cardiac Risk Stratification   Activity Barriers Right Hip Replacement;Left Hip Replacement;Arthritis;Back Problems;Joint Problems;Deconditioning;Shortness of Breath;Balance Concerns;Other (comment)    Comments hammer toes, atrial fibrillation    Cardiac Risk Stratification Moderate             6 Minute Walk:  6 Minute Walk     Row Name 09/02/21 0851         6 Minute Walk   Phase Initial     Distance 750 feet     Walk Time 6 minutes     # of Rest Breaks 0     MPH 1.42     METS 1.22     RPE 13     Perceived Dyspnea  15     VO2 Peak 4.29     Symptoms No     Resting HR 59 bpm      Resting BP 148/62     Resting Oxygen Saturation  98 %     Exercise Oxygen Saturation  during 6 min walk 96 %     Max Ex. HR 120 bpm     Max Ex. BP 158/60     2 Minute Post BP 120/56       Interval HR   1 Minute HR 110     2 Minute HR 109     3 Minute HR 119     4 Minute HR 116     5 Minute HR 116     6 Minute HR 120     2 Minute Post HR 68     Interval Heart Rate? Yes       Interval Oxygen   Interval Oxygen? Yes     Baseline Oxygen Saturation % 98 %     1 Minute Oxygen Saturation % 97 %     1 Minute Liters of Oxygen 0 L     2 Minute Oxygen Saturation % 97 %     2 Minute Liters of Oxygen 0 L     3 Minute Oxygen Saturation % 96 %     3 Minute Liters of Oxygen 0 L     4 Minute Oxygen Saturation % 97 %     4 Minute Liters of Oxygen 0 L     5 Minute Oxygen Saturation % 97 %     5 Minute Liters of Oxygen 0 L     6 Minute Oxygen Saturation % 97 %     6 Minute Liters of Oxygen 0 L     2 Minute  Post Oxygen Saturation % 98 %     2 Minute Post Liters of Oxygen 0 L              Oxygen Initial Assessment:  Oxygen Initial Assessment - 09/02/21 0921       Initial 6 min Walk   Oxygen Used None      Program Oxygen Prescription   Program Oxygen Prescription None      Intervention   Short Term Goals To learn and understand importance of maintaining oxygen saturations>88%;To learn and understand importance of monitoring SPO2 with pulse oximeter and demonstrate accurate use of the pulse oximeter.;To learn and demonstrate proper pursed lip breathing techniques or other breathing techniques.     Long  Term Goals Maintenance of O2 saturations>88%;Verbalizes importance of monitoring SPO2 with pulse oximeter and return demonstration;Exhibits proper breathing techniques, such as pursed lip breathing or other method taught during program session             Oxygen Re-Evaluation:  Oxygen Re-Evaluation     Row Name 09/16/21 1249             Program Oxygen Prescription    Program Oxygen Prescription None         Home Oxygen   Home Oxygen Device None       Sleep Oxygen Prescription CPAP       Liters per minute 0       Home Exercise Oxygen Prescription None       Home Resting Oxygen Prescription None       Compliance with Home Oxygen Use Yes         Goals/Expected Outcomes   Short Term Goals To learn and understand importance of maintaining oxygen saturations>88%;To learn and understand importance of monitoring SPO2 with pulse oximeter and demonstrate accurate use of the pulse oximeter.;To learn and demonstrate proper pursed lip breathing techniques or other breathing techniques.        Long  Term Goals Maintenance of O2 saturations>88%;Verbalizes importance of monitoring SPO2 with pulse oximeter and return demonstration;Exhibits proper breathing techniques, such as pursed lip breathing or other method taught during program session       Goals/Expected Outcomes complinace                Oxygen Discharge (Final Oxygen Re-Evaluation):  Oxygen Re-Evaluation - 09/16/21 1249       Program Oxygen Prescription   Program Oxygen Prescription None      Home Oxygen   Home Oxygen Device None    Sleep Oxygen Prescription CPAP    Liters per minute 0    Home Exercise Oxygen Prescription None    Home Resting Oxygen Prescription None    Compliance with Home Oxygen Use Yes      Goals/Expected Outcomes   Short Term Goals To learn and understand importance of maintaining oxygen saturations>88%;To learn and understand importance of monitoring SPO2 with pulse oximeter and demonstrate accurate use of the pulse oximeter.;To learn and demonstrate proper pursed lip breathing techniques or other breathing techniques.     Long  Term Goals Maintenance of O2 saturations>88%;Verbalizes importance of monitoring SPO2 with pulse oximeter and return demonstration;Exhibits proper breathing techniques, such as pursed lip breathing or other method taught during program session     Goals/Expected Outcomes complinace             Initial Exercise Prescription:  Initial Exercise Prescription - 09/02/21 0800       Date of Initial Exercise RX and Referring  Provider   Date 09/02/21    Referring Provider Dr. Annamaria Boots    Expected Discharge Date 01/06/22      Treadmill   MPH 1    Grade 0    Minutes 17      NuStep   Level 1    SPM 70    Minutes 22      Prescription Details   Frequency (times per week) 2    Duration Progress to 30 minutes of continuous aerobic without signs/symptoms of physical distress      Intensity   THRR 40-80% of Max Heartrate 56-111    Ratings of Perceived Exertion 11-13    Perceived Dyspnea 0-4      Resistance Training   Training Prescription Yes    Weight 2    Reps 10-15             Perform Capillary Blood Glucose checks as needed.  Exercise Prescription Changes:   Exercise Prescription Changes     Row Name 09/16/21 1200             Response to Exercise   Blood Pressure (Admit) 130/60       Blood Pressure (Exercise) 160/50       Blood Pressure (Exit) 110/50       Heart Rate (Admit) 62 bpm       Heart Rate (Exercise) 110 bpm       Heart Rate (Exit) 59 bpm       Oxygen Saturation (Admit) 98 %       Oxygen Saturation (Exercise) 95 %       Oxygen Saturation (Exit) 98 %       Rating of Perceived Exertion (Exercise) 15       Perceived Dyspnea (Exercise) 15       Duration Continue with 30 min of aerobic exercise without signs/symptoms of physical distress.       Intensity THRR unchanged         Progression   Progression Continue to progress workloads to maintain intensity without signs/symptoms of physical distress.         Resistance Training   Training Prescription Yes       Weight 2       Reps 10-15       Time 10 Minutes         Treadmill   MPH 1.2       Grade 0       Minutes 17       METs 1.92         NuStep   Level 1       SPM 97       Minutes 22       METs 2                Exercise  Comments:   Exercise Goals and Review:   Exercise Goals     Row Name 09/02/21 0854 09/16/21 1247           Exercise Goals   Increase Physical Activity Yes Yes      Intervention Provide advice, education, support and counseling about physical activity/exercise needs.;Develop an individualized exercise prescription for aerobic and resistive training based on initial evaluation findings, risk stratification, comorbidities and participant's personal goals. Provide advice, education, support and counseling about physical activity/exercise needs.;Develop an individualized exercise prescription for aerobic and resistive training based on initial evaluation findings, risk stratification, comorbidities and participant's personal goals.  Expected Outcomes Short Term: Attend rehab on a regular basis to increase amount of physical activity.;Long Term: Add in home exercise to make exercise part of routine and to increase amount of physical activity.;Long Term: Exercising regularly at least 3-5 days a week. Short Term: Attend rehab on a regular basis to increase amount of physical activity.;Long Term: Add in home exercise to make exercise part of routine and to increase amount of physical activity.;Long Term: Exercising regularly at least 3-5 days a week.      Increase Strength and Stamina Yes Yes      Intervention Provide advice, education, support and counseling about physical activity/exercise needs.;Develop an individualized exercise prescription for aerobic and resistive training based on initial evaluation findings, risk stratification, comorbidities and participant's personal goals. Provide advice, education, support and counseling about physical activity/exercise needs.;Develop an individualized exercise prescription for aerobic and resistive training based on initial evaluation findings, risk stratification, comorbidities and participant's personal goals.      Expected Outcomes Short Term: Increase  workloads from initial exercise prescription for resistance, speed, and METs.;Short Term: Perform resistance training exercises routinely during rehab and add in resistance training at home;Long Term: Improve cardiorespiratory fitness, muscular endurance and strength as measured by increased METs and functional capacity (6MWT) Short Term: Increase workloads from initial exercise prescription for resistance, speed, and METs.;Short Term: Perform resistance training exercises routinely during rehab and add in resistance training at home;Long Term: Improve cardiorespiratory fitness, muscular endurance and strength as measured by increased METs and functional capacity (6MWT)      Able to understand and use rate of perceived exertion (RPE) scale Yes Yes      Intervention Provide education and explanation on how to use RPE scale Provide education and explanation on how to use RPE scale      Expected Outcomes Short Term: Able to use RPE daily in rehab to express subjective intensity level;Long Term:  Able to use RPE to guide intensity level when exercising independently Short Term: Able to use RPE daily in rehab to express subjective intensity level;Long Term:  Able to use RPE to guide intensity level when exercising independently      Able to understand and use Dyspnea scale Yes Yes      Intervention Provide education and explanation on how to use Dyspnea scale Provide education and explanation on how to use Dyspnea scale      Expected Outcomes Short Term: Able to use Dyspnea scale daily in rehab to express subjective sense of shortness of breath during exertion;Long Term: Able to use Dyspnea scale to guide intensity level when exercising independently Short Term: Able to use Dyspnea scale daily in rehab to express subjective sense of shortness of breath during exertion;Long Term: Able to use Dyspnea scale to guide intensity level when exercising independently      Knowledge and understanding of Target Heart Rate  Range (THRR) Yes Yes      Intervention Provide education and explanation of THRR including how the numbers were predicted and where they are located for reference Provide education and explanation of THRR including how the numbers were predicted and where they are located for reference      Expected Outcomes Long Term: Able to use THRR to govern intensity when exercising independently;Short Term: Able to use daily as guideline for intensity in rehab;Short Term: Able to state/look up THRR Long Term: Able to use THRR to govern intensity when exercising independently;Short Term: Able to use daily as guideline for intensity in rehab;Short Term:  Able to state/look up THRR      Understanding of Exercise Prescription Yes Yes      Intervention Provide education, explanation, and written materials on patient's individual exercise prescription Provide education, explanation, and written materials on patient's individual exercise prescription      Expected Outcomes Short Term: Able to explain program exercise prescription;Long Term: Able to explain home exercise prescription to exercise independently Short Term: Able to explain program exercise prescription;Long Term: Able to explain home exercise prescription to exercise independently               Exercise Goals Re-Evaluation :  Exercise Goals Re-Evaluation     Row Name 09/16/21 1247             Exercise Goal Re-Evaluation   Exercise Goals Review Increase Physical Activity;Increase Strength and Stamina;Able to understand and use rate of perceived exertion (RPE) scale;Able to understand and use Dyspnea scale;Knowledge and understanding of Target Heart Rate Range (THRR);Understanding of Exercise Prescription       Comments Pt has completed 4 sessions in pulmonary rehab. She is motivated and wants to push herself during class to increase her workloads. She has notived that she is able to do yard work outside without having to stop frequently due to SOB.  She is currently exercising at 2.0 METs on the stepper. Will continue to monitor and progress as able.       Expected Outcomes Through exercise at rehab and at home, the patient will meet their stated goals.                Discharge Exercise Prescription (Final Exercise Prescription Changes):  Exercise Prescription Changes - 09/16/21 1200       Response to Exercise   Blood Pressure (Admit) 130/60    Blood Pressure (Exercise) 160/50    Blood Pressure (Exit) 110/50    Heart Rate (Admit) 62 bpm    Heart Rate (Exercise) 110 bpm    Heart Rate (Exit) 59 bpm    Oxygen Saturation (Admit) 98 %    Oxygen Saturation (Exercise) 95 %    Oxygen Saturation (Exit) 98 %    Rating of Perceived Exertion (Exercise) 15    Perceived Dyspnea (Exercise) 15    Duration Continue with 30 min of aerobic exercise without signs/symptoms of physical distress.    Intensity THRR unchanged      Progression   Progression Continue to progress workloads to maintain intensity without signs/symptoms of physical distress.      Resistance Training   Training Prescription Yes    Weight 2    Reps 10-15    Time 10 Minutes      Treadmill   MPH 1.2    Grade 0    Minutes 17    METs 1.92      NuStep   Level 1    SPM 97    Minutes 22    METs 2             Nutrition:  Target Goals: Understanding of nutrition guidelines, daily intake of sodium '1500mg'$ , cholesterol '200mg'$ , calories 30% from fat and 7% or less from saturated fats, daily to have 5 or more servings of fruits and vegetables.  Biometrics:  Pre Biometrics - 09/02/21 0855       Pre Biometrics   Height 5' 1.5" (1.562 m)    Weight 89.2 kg    Waist Circumference 46 inches    Hip Circumference 47 inches    Waist to  Hip Ratio 0.98 %    BMI (Calculated) 36.56    Triceps Skinfold 8 mm    % Body Fat 42.7 %    Grip Strength 24.8 kg    Flexibility 0 in    Single Leg Stand 0 seconds              Nutrition Therapy Plan and Nutrition  Goals:  Nutrition Therapy & Goals - 09/17/21 0727       Personal Nutrition Goals   Comments Patient scored 71 on his diet assessment. We offer 2 educational sessions on healthy nutrition with handouts and assistance with RD referral if patient is interested.      Intervention Plan   Intervention Nutrition handout(s) given to patient.    Expected Outcomes Short Term Goal: Understand basic principles of dietary content, such as calories, fat, sodium, cholesterol and nutrients.             Nutrition Assessments:  Nutrition Assessments - 09/02/21 0827       MEDFICTS Scores   Pre Score 71            MEDIFICTS Score Key: ?70 Need to make dietary changes  40-70 Heart Healthy Diet ? 40 Therapeutic Level Cholesterol Diet   Picture Your Plate Scores: <99 Unhealthy dietary pattern with much room for improvement. 41-50 Dietary pattern unlikely to meet recommendations for good health and room for improvement. 51-60 More healthful dietary pattern, with some room for improvement.  >60 Healthy dietary pattern, although there may be some specific behaviors that could be improved.    Nutrition Goals Re-Evaluation:   Nutrition Goals Discharge (Final Nutrition Goals Re-Evaluation):   Psychosocial: Target Goals: Acknowledge presence or absence of significant depression and/or stress, maximize coping skills, provide positive support system. Participant is able to verbalize types and ability to use techniques and skills needed for reducing stress and depression.  Initial Review & Psychosocial Screening:  Initial Psych Review & Screening - 09/02/21 0854       Initial Review   Current issues with None Identified      Family Dynamics   Good Support System? Yes    Comments Her support system includes her two nieces and the members of her church.      Barriers   Psychosocial barriers to participate in program There are no identifiable barriers or psychosocial needs.       Screening Interventions   Interventions Encouraged to exercise    Expected Outcomes Long Term goal: The participant improves quality of Life and PHQ9 Scores as seen by post scores and/or verbalization of changes;Short Term goal: Identification and review with participant of any Quality of Life or Depression concerns found by scoring the questionnaire.             Quality of Life Scores:  Quality of Life - 09/02/21 0856       Quality of Life   Select Quality of Life      Quality of Life Scores   Health/Function Pre 23.2 %    Socioeconomic Pre 26.08 %    Psych/Spiritual Pre 24.64 %    Family Pre 23.5 %    GLOBAL Pre 24.11 %            Scores of 19 and below usually indicate a poorer quality of life in these areas.  A difference of  2-3 points is a clinically meaningful difference.  A difference of 2-3 points in the total score of the Quality of Life Index  has been associated with significant improvement in overall quality of life, self-image, physical symptoms, and general health in studies assessing change in quality of life.   PHQ-9: Review Flowsheet       09/02/2021 03/18/2018 10/01/2017  Depression screen PHQ 2/9  Decreased Interest 0 0 0  Down, Depressed, Hopeless 0 0 0  PHQ - 2 Score 0 0 0  Altered sleeping '2 1 2  '$ Tired, decreased energy '2 2 2  '$ Change in appetite 0 2 2  Feeling bad or failure about yourself  0 0 0  Trouble concentrating 0 2 0  Moving slowly or fidgety/restless 0 0 0  Suicidal thoughts 0 0 0  PHQ-9 Score '4 7 6  '$ Difficult doing work/chores Somewhat difficult Not difficult at all Not difficult at all   Interpretation of Total Score  Total Score Depression Severity:  1-4 = Minimal depression, 5-9 = Mild depression, 10-14 = Moderate depression, 15-19 = Moderately severe depression, 20-27 = Severe depression   Psychosocial Evaluation and Intervention:  Psychosocial Evaluation - 09/02/21 4235       Psychosocial Evaluation & Interventions    Interventions Encouraged to exercise with the program and follow exercise prescription    Comments Pt has no barriers to participating in PR. She has no identifiable psychsocial issues. She scored a 4 on her PHQ-9 and this relates to her sleeping too much due to her lack of energy. She believes that this is due to her chronic lung condition and her atrial fibrillation. She reports that she took antidepressants about 15 years ago when her mother passed away, but she did not like the way that the medications made her feel, and that she would not consider taking these medications again. She is also participating in PT twice per week with the hopes that she will be able to strengthen her body. She reports that her support system includes her two nieces and her church family. Her goals while in the program are to decrease her SOB with ADL's, improve her energy levels, and to be able to walk longer distances. She specifically states that she would like to be able to take a 30 minute walk without stopping. She has a positive mindset and is eager to start the program. She previously participated in [place] in 2019, and she felt like it helped her a lot then.    Expected Outcomes Pt will continue to have no identifiable psychosocial issues.    Continue Psychosocial Services  No Follow up required             Psychosocial Re-Evaluation:  Psychosocial Re-Evaluation     Katy Name 09/08/21 1441 09/08/21 1455 09/12/21 0917 09/12/21 0930       Psychosocial Re-Evaluation   Current issues with -- None Identified -- None Identified    Comments Patient is on session 2 and is tolerating the program well.  Patient has had one educational session on stress management.  We will continue to monitor. Patient is on session 1 and is tolerating the program well.  Patient has had one educational session on stress management.  We will continue to monitor. Patient is on session 4 and is tolerating the program well.  We will continue  to monitor. Patient is on session 4 and is tolerating the program well.  Patient is tolerating NuStep and Treadmill well.  Sats are 95-98% on RA, no resp distress noted.  VSS.  Patient has no identifiable issues.  We will continue to monitor.  Expected Outcomes Patient will have no psychosocial issues identified at discharge.  Patient will have no psychosocial issues identified at discharge.  -- Patient will have no psychosocial issues identified at discharge.    Interventions Relaxation education;Encouraged to attend Pulmonary Rehabilitation for the exercise;Stress management education Relaxation education;Encouraged to attend Pulmonary Rehabilitation for the exercise;Stress management education -- Relaxation education;Encouraged to attend Pulmonary Rehabilitation for the exercise;Stress management education    Continue Psychosocial Services  No Follow up required No Follow up required -- No Follow up required             Psychosocial Discharge (Final Psychosocial Re-Evaluation):  Psychosocial Re-Evaluation - 09/12/21 0930       Psychosocial Re-Evaluation   Current issues with None Identified    Comments Patient is on session 4 and is tolerating the program well.  Patient is tolerating NuStep and Treadmill well.  Sats are 95-98% on RA, no resp distress noted.  VSS.  Patient has no identifiable issues.  We will continue to monitor.    Expected Outcomes Patient will have no psychosocial issues identified at discharge.    Interventions Relaxation education;Encouraged to attend Pulmonary Rehabilitation for the exercise;Stress management education    Continue Psychosocial Services  No Follow up required              Education: Education Goals: Education classes will be provided on a weekly basis, covering required topics. Participant will state understanding/return demonstration of topics presented.  Learning Barriers/Preferences:  Learning Barriers/Preferences - 09/02/21 0857        Learning Barriers/Preferences   Learning Barriers None    Learning Preferences Skilled Demonstration;Individual Instruction;Group Instruction             Education Topics: How Lungs Work and Diseases: - Discuss the anatomy of the lungs and diseases that can affect the lungs, such as COPD. Flowsheet Row PULMONARY REHAB OTHER RESPIRATORY from 03/10/2018 in [place]  Date 11/04/17  Educator [place]  Instruction Review Code 2- Demonstrated Understanding       Exercise: -Discuss the importance of exercise, FITT principles of exercise, normal and abnormal responses to exercise, and how to exercise safely.   Environmental Irritants: -Discuss types of environmental irritants and how to limit exposure to environmental irritants. Flowsheet Row PULMONARY REHAB OTHER RESPIRATORY from 03/10/2018 in [place]  Date 11/11/17  Educator DWynetta Emery  Instruction Review Code 2- Demonstrated Understanding       Meds/Inhalers and oxygen: - Discuss respiratory medications, definition of an inhaler and oxygen, and the proper way to use an inhaler and oxygen. Flowsheet Row PULMONARY REHAB OTHER RESPIRATORY from 03/10/2018 in [place]  Date 11/18/17  Educator D. Wynetta Emery       Energy Saving Techniques: - Discuss methods to conserve energy and decrease shortness of breath when performing activities of daily living.  Flowsheet Row PULMONARY REHAB OTHER RESPIRATORY from 03/10/2018 in [place]  Date 11/25/17  Educator Etheleen Mayhew  Instruction Review Code 2- Demonstrated Understanding       Bronchial Hygiene / Breathing Techniques: - Discuss breathing mechanics, pursed-lip breathing technique,  proper posture, effective ways to clear airways, and other functional breathing techniques   Cleaning Equipment: - Provides group verbal and written instruction about the health risks of elevated stress,  cause of high stress, and healthy ways to reduce stress. Flowsheet Row PULMONARY REHAB OTHER RESPIRATORY from 03/10/2018 in [place]  Date 03/10/18  Educator D. Wynetta Emery  Instruction Review Code 2- Demonstrated Understanding       Nutrition I: Fats: - Discuss the types of cholesterol, what cholesterol does to the body, and how cholesterol levels can be controlled. Flowsheet Row PULMONARY REHAB OTHER RESPIRATORY from 03/10/2018 in [place]  Date 12/16/17  Educator Etheleen Mayhew  Instruction Review Code 2- Demonstrated Understanding       Nutrition II: Labels: -Discuss the different components of food labels and how to read food labels. Flowsheet Row PULMONARY REHAB OTHER RESPIRATORY from 03/10/2018 in [place]  Date 12/23/17  Educator Etheleen Mayhew  Instruction Review Code 2- Demonstrated Understanding       Respiratory Infections: - Discuss the signs and symptoms of respiratory infections, ways to prevent respiratory infections, and the importance of seeking medical treatment when having a respiratory infection. Flowsheet Row PULMONARY REHAB OTHER RESPIRATORY from 03/10/2018 in [place]  Date 01/06/18  Educator Etheleen Mayhew  Instruction Review Code 2- Demonstrated Understanding       Stress I: Signs and Symptoms: - Discuss the causes of stress, how stress may lead to anxiety and depression, and ways to limit stress. Flowsheet Row PULMONARY REHAB OTHER RESPIRATORY from 09/04/2021 in [place]  Date 09/03/21  Educator [place]  Instruction Review Code 1- Verbalizes Understanding       Stress II: Relaxation: -Discuss relaxation techniques to limit stress. Flowsheet Row PULMONARY REHAB OTHER RESPIRATORY from 09/11/2021 in [place]  Date 09/11/21  Instruction Review Code 1- Verbalizes Understanding       Oxygen for Home/Travel: -  Discuss how to prepare for travel when on oxygen and proper ways to transport and store oxygen to ensure safety.   Knowledge Questionnaire Score:  Knowledge Questionnaire Score - 09/02/21 0824       Knowledge Questionnaire Score   Pre Score 13/18             Core Components/Risk Factors/Patient Goals at Admission:  Personal Goals and Risk Factors at Admission - 09/02/21 0900       Core Components/Risk Factors/Patient Goals on Admission    Weight Management Yes;Obesity;Weight Loss    Intervention Weight Management: Develop a combined nutrition and exercise program designed to reach desired caloric intake, while maintaining appropriate intake of nutrient and fiber, sodium and fats, and appropriate energy expenditure required for the weight goal.;Weight Management: Provide education and appropriate resources to help participant work on and attain dietary goals.;Weight Management/Obesity: Establish reasonable short term and long term weight goals.;Obesity: Provide education and appropriate resources to help participant work on and attain dietary goals.    Expected Outcomes Short Term: Continue to assess and modify interventions until short term weight is achieved;Long Term: Adherence to nutrition and physical activity/exercise program aimed toward attainment of established weight goal;Weight Maintenance: Understanding of the daily nutrition guidelines, which includes 25-35% calories from fat, 7% or less cal from saturated fats, less than '200mg'$  cholesterol, less than 1.5gm of sodium, & 5 or more servings of fruits and vegetables daily;Weight Loss: Understanding of general recommendations for a balanced deficit meal plan, which promotes 1-2 lb weight loss per week and includes a negative energy balance of [phone] kcal/d;Understanding recommendations for meals to include 15-35% energy as protein, 25-35% energy from fat, 35-60% energy from carbohydrates, less than '200mg'$  of dietary cholesterol, 20-35  gm of total fiber daily;Understanding of distribution of calorie intake throughout the day with the consumption of 4-5 meals/snacks    Improve shortness of breath  with ADL's Yes    Intervention Provide education, individualized exercise plan and daily activity instruction to help decrease symptoms of SOB with activities of daily living.    Expected Outcomes Short Term: Improve cardiorespiratory fitness to achieve a reduction of symptoms when performing ADLs;Long Term: Be able to perform more ADLs without symptoms or delay the onset of symptoms    Personal Goal Other Yes    Personal Goal She wants to improve her energy level and to be able to walk longer distances.    Intervention Attend CR 3 x week and supplement with at home exercise 2 x week.     Expected Outcomes Achieve personal goals.              Core Components/Risk Factors/Patient Goals Review:   Goals and Risk Factor Review     Row Name 09/08/21 1445 09/12/21 0915 09/12/21 0937         Core Components/Risk Factors/Patient Goals Review   Review Patient completed 1 session. She has completed this program in the passed and is doing well in this program. She has recently had chronic hip pain which limitions.  She  says the program helped her alot. She has more strength and stamina now with increased energy.  She plans to continue exercising at the [facility] and by walking with some friends. We will continue to monitor. Patient completed 1 session. She has completed this program in the passed and is doing well in this program. She has recently had chronic hip pain which limitions.  She  says the program helped her alot. She has more strength and stamina now with increased energy.  She plans to continue exercising at the [facility] and by walking with some friends. We will continue to monitor. Patient completed 4 session. She has completed this program in the passed and is doing well in this program. She has recently had chronic hip pain which  limitions.  She  says the program helped her alot. She has more strength and stamina now with increased energy.  She plans to continue exercising at the [facility] and by walking with some friends. We will continue to monitor.     Expected Outcomes Patient will  complete the program and meet her personal goals. -- Patient will  complete the program and meet her personal goals.              Core Components/Risk Factors/Patient Goals at Discharge (Final Review):   Goals and Risk Factor Review - 09/12/21 0937       Core Components/Risk Factors/Patient Goals Review   Review Patient completed 4 session. She has completed this program in the passed and is doing well in this program. She has recently had chronic hip pain which limitions.  She  says the program helped her alot. She has more strength and stamina now with increased energy.  She plans to continue exercising at the [facility] and by walking with some friends. We will continue to monitor.    Expected Outcomes Patient will  complete the program and meet her personal goals.             ITP Comments:   Comments: ITP REVIEW Pt is making expected progress toward pulmonary rehab goals after completing 5 sessions. Recommend continued exercise, life style modification, education, and utilization of breathing techniques to increase stamina and strength and decrease shortness of breath with exertion.

## 2021-09-18 ENCOUNTER — Encounter (HOSPITAL_COMMUNITY)
Admission: RE | Admit: 2021-09-18 | Discharge: 2021-09-18 | Disposition: A | Discharge status: Home / Self Care | Payer: Medicare Other | Source: Ambulatory Visit | Attending: Internal Medicine | Admitting: Internal Medicine

## 2021-09-18 DIAGNOSIS — J4540 Moderate persistent asthma, uncomplicated: Secondary | ICD-10-CM | POA: Diagnosis not present

## 2021-09-18 NOTE — Progress Notes (Signed)
Daily Session Note  Patient Details  Name: [name] [name] MRN: [id number] Date of Birth: Aug 31, 1940 Referring Provider:   Flowsheet Row PULMONARY REHAB OTHER RESP ORIENTATION from 09/02/2021 in [place]  Referring Provider Dr. Annamaria Boots       Encounter Date: 09/18/2021  Check In:  Session Check In - 09/18/21 1045       Check-In   Supervising physician immediately available to respond to emergencies [facility] MD immediately available    Physician(s) Dr Gardiner Rhyme    Location [facility]    Staff Present Redge Gainer, BS, Exercise Physiologist;Kortland Nichols Hassell Done, RN, Bjorn Loser, MS, ACSM-CEP, Exercise Physiologist;Debra Wynetta Emery, RN, BSN    Virtual Visit No    Medication changes reported     No    Fall or balance concerns reported    No    Comments She has not fallen but walks with a cane for support. 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    Tobacco Cessation No Change    Warm-up and Cool-down Performed as group-led instruction    Resistance Training Performed No    VAD Patient? No    PAD/SET Patient? No      Pain Assessment   Currently in Pain? No/denies    Pain Score 0-No pain    Multiple Pain Sites No             Capillary Blood Glucose: No results found for this or any previous visit (from the past 24 hour(s)).    Social History   Tobacco Use  Smoking Status Never  Smokeless Tobacco Never    Goals Met:  Independence with exercise equipment Exercise tolerated well No report of concerns or symptoms today Strength training completed today  Goals Unmet:  Not Applicable  Comments: checkout at 1145.   Dr. Kathie Dike is Medical Director for [facility].

## 2021-09-19 DIAGNOSIS — M79672 Pain in left foot: Secondary | ICD-10-CM | POA: Diagnosis not present

## 2021-09-19 DIAGNOSIS — M9901 Segmental and somatic dysfunction of cervical region: Secondary | ICD-10-CM | POA: Diagnosis not present

## 2021-09-19 DIAGNOSIS — M542 Cervicalgia: Secondary | ICD-10-CM | POA: Diagnosis not present

## 2021-09-19 DIAGNOSIS — M79671 Pain in right foot: Secondary | ICD-10-CM | POA: Diagnosis not present

## 2021-09-19 DIAGNOSIS — M9905 Segmental and somatic dysfunction of pelvic region: Secondary | ICD-10-CM | POA: Diagnosis not present

## 2021-09-19 DIAGNOSIS — M9902 Segmental and somatic dysfunction of thoracic region: Secondary | ICD-10-CM | POA: Diagnosis not present

## 2021-09-19 DIAGNOSIS — M9903 Segmental and somatic dysfunction of lumbar region: Secondary | ICD-10-CM | POA: Diagnosis not present

## 2021-09-19 DIAGNOSIS — M48061 Spinal stenosis, lumbar region without neurogenic claudication: Secondary | ICD-10-CM | POA: Diagnosis not present

## 2021-09-19 DIAGNOSIS — M546 Pain in thoracic spine: Secondary | ICD-10-CM | POA: Diagnosis not present

## 2021-09-23 ENCOUNTER — Encounter (HOSPITAL_COMMUNITY)
Admission: RE | Admit: 2021-09-23 | Discharge: 2021-09-23 | Disposition: A | Discharge status: Home / Self Care | Payer: Medicare Other | Source: Ambulatory Visit | Attending: Internal Medicine | Admitting: Internal Medicine

## 2021-09-23 DIAGNOSIS — J4540 Moderate persistent asthma, uncomplicated: Secondary | ICD-10-CM

## 2021-09-23 NOTE — Progress Notes (Signed)
Cardiology Office Note  Date: 09/25/2021   ID: [name] [name], [name] [name] March 11, 1941, MRN [id number]  PCP:  Monico Blitz, MD  Cardiologist:  Rozann Lesches, MD Electrophysiologist:  Thompson Grayer, MD   Chief Complaint  Patient presents with   Cardiac follow-up    History of Present Illness: [name] [name] is an 81 y.o. female last seen in December 2022.  She had a recent interval visit with Dr. Rayann Heman in early June, I reviewed the note.  She is overall satisfied with rhythm control, still has occasional palpitations, but feels better than she did 6 months ago.  She is undergoing PT and pulmonary rehabilitation at this time.  Most recent interrogation of ILR showed 2.1% AF burden.  She has follow-up in the atrial fibrillation clinic in July.  I reviewed her medications, she remains on Cardizem CD, Tikosyn, and Xarelto.  Recent ECG noted.  She does not report any spontaneous bleeding problems.  I reviewed her recent lab work as noted below.  Past Medical History:  Diagnosis Date   Allergic rhinitis    Anxiety    Asthma    Atrial flutter (HCC)    COPD (chronic obstructive pulmonary disease) ([place])    Depression    Essential hypertension    Hyperlipidemia    Lumbar disc disease    Obesity    OSA (obstructive sleep apnea)    CPAP   Paroxysmal atrial fibrillation (HCC)    Element of tachycardia bradycardia syndrome   Respiratory failure ([place])     Past Surgical History:  Procedure Laterality Date   ANTERIOR FUSION LUMBAR SPINE     ATRIAL FIBRILLATION ABLATION N/A 09/12/2020   Procedure: ATRIAL FIBRILLATION ABLATION;  Surgeon: Thompson Grayer, MD;  Location: [facility];  Service: Cardiovascular;  Laterality: N/A;   BIOPSY  08/27/2016   Procedure: BIOPSY;  Surgeon: Rogene Houston, MD;  [facility];  Service: Endoscopy;;  FOUR GASTRIC POLYPS BIOPSIED   CARDIOVERSION N/A 03/28/2014   Procedure: CARDIOVERSION;  Surgeon: Fay Records, MD;  Location: [facility];   Service: Cardiovascular;  Laterality: N/A;   CARDIOVERSION N/A 10/22/2016   Procedure: CARDIOVERSION;  Surgeon: Sueanne Margarita, MD;  Location: [facility] ENDOSCOPY;  Service: Cardiovascular;  Laterality: N/A;   CARDIOVERSION N/A 11/26/2020   Procedure: CARDIOVERSION;  Surgeon: Fay Records, MD;  Location: [facility];  Service: Cardiovascular;  Laterality: N/A;   CATARACT EXTRACTION     COLONOSCOPY N/A 08/04/2012   Procedure: COLONOSCOPY;  Surgeon: Rogene Houston, MD;  [facility];  Service: Endoscopy;  Laterality: N/A;  730-rescheduled to [place] notified pt   ELECTROPHYSIOLOGIC STUDY N/A 09/18/2014   Procedure: Atrial Fibrillation Ablation;  Surgeon: Thompson Grayer, MD;  Location: [place] [facility];  Service: Cardiovascular;  Laterality: N/A;   ESOPHAGOGASTRODUODENOSCOPY N/A 08/27/2016   Procedure: ESOPHAGOGASTRODUODENOSCOPY (EGD);  Surgeon: Rogene Houston, MD;  [facility];  Service: Endoscopy;  Laterality: N/A;  1200   implantable loop recorder placement  11/22/2020   Medtronic Reveal Linq model LNQ 11 (SN [id number]) implantable loop recorder   LASIK     Both eyes   TEE WITHOUT CARDIOVERSION N/A 09/18/2014   Procedure: TRANSESOPHAGEAL ECHOCARDIOGRAM (TEE);  Surgeon: Larey Dresser, MD;  Location: [facility];  Service: Cardiovascular;  Laterality: N/A;   TEE WITHOUT CARDIOVERSION N/A 10/22/2016   Procedure: TRANSESOPHAGEAL ECHOCARDIOGRAM (TEE);  Surgeon: Sueanne Margarita, MD;  Location: [facility];  Service: Cardiovascular;  Laterality: N/A;  TEE WITHOUT CARDIOVERSION N/A 08/24/2017   Procedure: TRANSESOPHAGEAL ECHOCARDIOGRAM (TEE);  Surgeon: Josue Hector, MD;  Location: [facility] ENDOSCOPY;  Service: Cardiovascular;  Laterality: N/A;   TOTAL HIP ARTHROPLASTY  03/30/2008   Right    Current Outpatient Medications  Medication Sig Dispense Refill   acetaminophen (TYLENOL) 500 MG tablet Take 500 mg by mouth every 8 (eight) hours as needed for headache.      antiseptic oral rinse (BIOTENE) LIQD 15 mLs by Mouth Rinse route daily as needed for dry mouth.     atorvastatin (LIPITOR) 10 MG tablet Take 10 mg by mouth daily at 6 PM.     benzonatate (TESSALON) 200 MG capsule Take 1 capsule (200 mg total) by mouth 3 (three) times daily as needed for cough. 30 capsule 3   Biotin 1 MG CAPS Take 1,000 mg by mouth 3 (three) times a week.      budesonide (PULMICORT) 0.5 MG/2ML nebulizer solution Take 0.5 mg by nebulization daily. Additional if needed during the day     Calcium Carbonate-Vit D-Min (QC CALCIUM-MAGNESIUM-ZINC-D3) 333.4-133 MG-UNIT TABS Take 1 tablet by mouth 2 (two) times daily.      cetirizine (ZYRTEC) 10 MG tablet Take 10 mg by mouth at bedtime as needed for allergies.     clindamycin (CLEOCIN) 300 MG capsule Take 600 mg by mouth See admin instructions. Take prior to dental procedures  0   diltiazem (CARDIZEM CD) 120 MG 24 hr capsule Take 1 capsule (120 mg total) by mouth every evening.     diltiazem (CARDIZEM CD) 240 MG 24 hr capsule Take 1 capsule (240 mg total) by mouth every morning. 90 capsule 1   diltiazem (CARDIZEM) 30 MG tablet TAKE 1 TABLET BY MOUTH AS NEEDED FOR PALPITATIONS 45 tablet 2   dofetilide (TIKOSYN) 500 MCG capsule TAKE ONE CAPSULE BY MOUTH TWICE A DAY 180 capsule 1   EPINEPHrine 0.3 mg/0.3 mL IJ SOAJ injection Inject 0.3 mg into the muscle as needed for anaphylaxis.     famotidine (PEPCID) 20 MG tablet Take 2 tablets (40 mg total) by mouth at bedtime as needed. 30 tablet 1   fluticasone (FLONASE) 50 MCG/ACT nasal spray Place 1 spray into both nostrils 2 (two) times daily.     hydrALAZINE (APRESOLINE) 100 MG tablet Take 100 mg by mouth 2 (two) times daily.     HYDROcodone bit-homatropine (HYCODAN) 5-1.5 MG/5ML syrup Take 5 mLs by mouth every 6 (six) hours as needed for cough.     Hypertonic Nasal Wash (SINUS RINSE) PACK Place 1 each into the nose daily as needed (clear nasal passage). Neilmed sinus rinse     ipratropium-albuterol  (DUONEB) 0.5-2.5 (3) MG/3ML SOLN Take 3 mLs by nebulization 4 (four) times daily as needed (Shortness of breathing).     losartan (COZAAR) 100 MG tablet TAKE 1 TABLET BY MOUTH DAILY 90 tablet 3   Magnesium 250 MG TABS Take 250 mg by mouth daily.     montelukast (SINGULAIR) 10 MG tablet Take 10 mg by mouth at bedtime.     omeprazole (PRILOSEC) 40 MG capsule Take 1 capsule (40 mg total) by mouth daily. 60 capsule 2   Polyethyl Glycol-Propyl Glycol (SYSTANE) 0.4-0.3 % GEL ophthalmic gel Place 1 application. into both eyes daily as needed (Dry eye).     Polyethyl Glycol-Propyl Glycol (SYSTANE) 0.4-0.3 % SOLN Place 1 drop into both eyes daily as needed (Dry eye).     potassium chloride SA (KLOR-CON M) 20 MEQ tablet TAKE 1  TABLET BY MOUTH DAILY 90 tablet 3   rivaroxaban (XARELTO) 20 MG TABS tablet Take 1 tablet (20 mg total) by mouth daily with supper. 90 tablet 3   umeclidinium-vilanterol (ANORO ELLIPTA) 62.5-25 MCG/ACT AEPB INHALE ONE PUFF INTO THE LUNGS DAILY (Patient taking differently: 1 puff every morning.) 60 each 11   Wheat Dextrin (BENEFIBER ON THE GO) POWD Take 10 mLs by mouth daily.     No current facility-administered medications for this visit.   Allergies:  Bupropion hcl, Escitalopram oxalate, Fluticasone-salmeterol, Lisinopril, and Serevent   ROS: No syncope.  Physical Exam: VS:  BP (!) 128/50   Pulse (!) 53   Ht 5' 1.5" (1.562 m)   Wt 198 lb 12.8 oz (90.2 kg)   SpO2 98%   BMI 36.95 kg/m , BMI Body mass index is 36.95 kg/m.  Wt Readings from Last 3 Encounters:  09/25/21 198 lb 12.8 oz (90.2 kg)  09/24/21 198 lb 9.6 oz (90.1 kg)  09/16/21 198 lb 13.7 oz (90.2 kg)    General: Patient appears comfortable at rest. HEENT: Conjunctiva and lids normal, oropharynx clear. Neck: Supple, no elevated JVP or carotid bruits, no thyromegaly. Lungs: Clear to auscultation, nonlabored breathing at rest. Cardiac: RRR with ectopy, no S3, 1/6 systolic murmur. Extremities: No pitting  edema.  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  Recent Labwork: 11/26/2020: Hemoglobin 11.6 09/08/2021: BUN 20; Creatinine, Ser 0.90; Magnesium 2.3; Potassium 3.9; Sodium 143   Other Studies Reviewed Today:  Echocardiogram 09/20/2019:  1. Left ventricular ejection fraction, by estimation, is 60 to 65%. The  left ventricle has normal function. The left ventricle has no regional  wall motion abnormalities. There is moderate left ventricular hypertrophy.  Left ventricular diastolic  parameters are consistent with Grade II diastolic dysfunction  (pseudonormalization). Elevated left atrial pressure.   2. Right ventricular systolic function is normal. The right ventricular  size is normal. There is mildly elevated pulmonary artery systolic  pressure.   3. Left atrial size was severely dilated.   4. The mitral valve is normal in structure. Trivial mitral valve  regurgitation. No evidence of mitral stenosis.   5. The aortic valve is tricuspid. Aortic valve regurgitation is not  visualized. No aortic stenosis is present.   6. Indeterminant PASP, inadequate TR jet.   7. The inferior vena cava is dilated in size with >50% respiratory  variability, suggesting right atrial pressure of 8 mmHg.    Lexiscan Myoview 02/01/2020: There was no ST segment deviation noted during stress. This is a low risk study. The left ventricular ejection fraction is normal (55-65%). Anterior defect represents small prior infarct with mild peri-infarct ischemia vs mild differences in breast attenuation. Either finding would support low risk.  Assessment and Plan:  1.  Paroxysmal to persistent atrial fibrillation as well as atypical atrial flutter.  Overall rhythm burden is fairly low following ablation and on Tikosyn.  Still has intermittent palpitations, but symptomatically feels better in the last 6 months.  She  remains on Xarelto for stroke prophylaxis with CHA2DS2-VASc score of at least 5.  ILR in place for rhythm surveillance per EP.  I reviewed her recent ECG and lab work.  No changes to current regimen which includes Cardizem CD and as needed short acting Cardizem as well.  2.  Tachycardia-bradycardia syndrome.  Symptomatically stable.  No syncope.  Continue observation for now.  She is tolerating current dose  of AV nodal blockers.  Medication Adjustments/Labs and Tests Ordered: Current medicines are reviewed at length with the patient today.  Concerns regarding medicines are outlined above.   Tests Ordered: No orders of the defined types were placed in this encounter.   Medication Changes: No orders of the defined types were placed in this encounter.   Disposition:  Follow up  6 months.  Signed, Satira Sark, MD, [facility] 09/25/2021 8:38 AM    [facility] [address], [place], [place] [address] Phone: [phone]; Fax: [phone]

## 2021-09-23 NOTE — Progress Notes (Signed)
Daily Session Note  Patient Details  Name: [name] [name] MRN: [id number] Date of Birth: [birth year] Referring Provider:   Flowsheet Row PULMONARY REHAB OTHER RESP ORIENTATION from 09/02/2021 in [facility] CARDIAC REHABILITATION  Referring Provider Dr. Maple Hudson       Encounter Date: 09/23/2021  Check In:  Session Check In - 09/23/21 1045       Check-In   Supervising physician immediately available to respond to emergencies [facility] MD immediately available    Physician(s) Dr. Wyline Mood    Location [facility]    Staff Present Tyrone Sage, BS, Exercise Physiologist;Verline Kong Primitivo Gauze, MS, ACSM-CEP, Exercise Physiologist    Virtual Visit No    Medication changes reported     No    Fall or balance concerns reported    No    Tobacco Cessation No Change    Warm-up and Cool-down Performed as group-led instruction    Resistance Training Performed Yes    PAD/SET Patient? No      Pain Assessment   Currently in Pain? No/denies    Pain Score 0-No pain    Multiple Pain Sites No             Capillary Blood Glucose: No results found for this or any previous visit (from the past 24 hour(s)).    Social History   Tobacco Use  Smoking Status Never  Smokeless Tobacco Never    Goals Met:  Proper associated with RPD/PD & O2 Sat Independence with exercise equipment Using PLB without cueing & demonstrates good technique Exercise tolerated well Queuing for purse lip breathing No report of concerns or symptoms today Strength training completed today  Goals Unmet:  Not Applicable  Comments: checkout time is 1145   Dr. Erick Blinks is Medical Director for [facility] Pulmonary Rehab.

## 2021-09-24 ENCOUNTER — Ambulatory Visit (INDEPENDENT_AMBULATORY_CARE_PROVIDER_SITE_OTHER): Payer: Medicare Other

## 2021-09-24 VITALS — BP 142/57 | HR 57 | Temp 97.8°F | Resp 18 | Ht 61.0 in | Wt 198.6 lb

## 2021-09-24 DIAGNOSIS — J4520 Mild intermittent asthma, uncomplicated: Secondary | ICD-10-CM

## 2021-09-24 MED ORDER — MEPOLIZUMAB 100 MG ~~LOC~~ SOLR
100.0000 mg | Freq: Once | SUBCUTANEOUS | Status: AC
  Administered 2021-09-24: 100 mg via SUBCUTANEOUS
  Filled 2021-09-24: qty 1

## 2021-09-24 NOTE — Progress Notes (Signed)
Diagnosis: Asthma ? ?Provider:  Praveen Mannam, MD ? ?Procedure: Injection ? ?Nucala (Mepolizumab), Dose: 100 mg, Site: subcutaneous, Number of injections: 1 ? ?Discharge: Condition: Good, Destination: Home . AVS provided to patient.  ? ?Performed by:  Kemari Narez, RN  ? ? ? ?  ?

## 2021-09-25 ENCOUNTER — Ambulatory Visit (INDEPENDENT_AMBULATORY_CARE_PROVIDER_SITE_OTHER): Payer: Medicare Other | Admitting: Cardiology

## 2021-09-25 ENCOUNTER — Ambulatory Visit: Payer: Medicare Other | Admitting: Cardiology

## 2021-09-25 ENCOUNTER — Encounter (HOSPITAL_COMMUNITY)
Admission: RE | Admit: 2021-09-25 | Discharge: 2021-09-25 | Disposition: A | Discharge status: Home / Self Care | Payer: Medicare Other | Source: Ambulatory Visit | Attending: Internal Medicine | Admitting: Internal Medicine

## 2021-09-25 ENCOUNTER — Encounter: Payer: Self-pay | Admitting: Cardiology

## 2021-09-25 VITALS — BP 128/50 | HR 53 | Ht 61.5 in | Wt 198.8 lb

## 2021-09-25 DIAGNOSIS — I495 Sick sinus syndrome: Secondary | ICD-10-CM | POA: Diagnosis not present

## 2021-09-25 DIAGNOSIS — J4540 Moderate persistent asthma, uncomplicated: Secondary | ICD-10-CM | POA: Diagnosis not present

## 2021-09-25 DIAGNOSIS — I480 Paroxysmal atrial fibrillation: Secondary | ICD-10-CM | POA: Diagnosis not present

## 2021-09-25 DIAGNOSIS — I484 Atypical atrial flutter: Secondary | ICD-10-CM | POA: Diagnosis not present

## 2021-09-25 DIAGNOSIS — R931 Abnormal findings on diagnostic imaging of heart and coronary circulation: Secondary | ICD-10-CM

## 2021-09-25 DIAGNOSIS — I10 Essential (primary) hypertension: Secondary | ICD-10-CM | POA: Diagnosis not present

## 2021-09-25 NOTE — Patient Instructions (Signed)

## 2021-09-25 NOTE — Progress Notes (Signed)
Daily Session Note  Patient Details  Name: [name] [name] MRN: [id number] Date of Birth: Jan 01, 1941 Referring Provider:   Flowsheet Row PULMONARY REHAB OTHER RESP ORIENTATION from 09/02/2021 in [place]  Referring Provider Dr. Annamaria Boots       Encounter Date: 09/25/2021  Check In:  Session Check In - 09/25/21 1037       Check-In   Supervising physician immediately available to respond to emergencies [facility] MD immediately available    Physician(s) Dr Debara Pickett    Location [facility]    Staff Present Redge Gainer, BS, Exercise Physiologist;Dalton Kris Mouton, MS, ACSM-CEP, Exercise Physiologist;Phyllis Billingsley, Kermit Balo, RN, Joanette Gula, RN, BSN    Virtual Visit No    Medication changes reported     No    Fall or balance concerns reported    No    Comments She has not fallen but walks with a cane for support. 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    Tobacco Cessation No Change    Warm-up and Cool-down Performed as group-led instruction    Resistance Training Performed Yes    VAD Patient? No    PAD/SET Patient? No      Pain Assessment   Currently in Pain? No/denies    Pain Score 0-No pain    Multiple Pain Sites No             Capillary Blood Glucose: No results found for this or any previous visit (from the past 24 hour(s)).    Social History   Tobacco Use  Smoking Status Never  Smokeless Tobacco Never    Goals Met:  Independence with exercise equipment Using PLB without cueing & demonstrates good technique Exercise tolerated well Queuing for purse lip breathing No report of concerns or symptoms today Strength training completed today  Goals Unmet:  Not Applicable  Comments: Checkout at 1145.   Dr. Kathie Dike is Medical Director for [facility] Pulmonary Rehab.

## 2021-09-29 ENCOUNTER — Ambulatory Visit (INDEPENDENT_AMBULATORY_CARE_PROVIDER_SITE_OTHER): Payer: Medicare Other

## 2021-09-29 DIAGNOSIS — I495 Sick sinus syndrome: Secondary | ICD-10-CM | POA: Diagnosis not present

## 2021-09-30 ENCOUNTER — Encounter (HOSPITAL_COMMUNITY): Payer: Medicare Other

## 2021-09-30 LAB — CUP PACEART REMOTE DEVICE CHECK
Date Time Interrogation Session: 20230702231914
Implantable Pulse Generator Implant Date: 20220826

## 2021-10-02 ENCOUNTER — Encounter (HOSPITAL_COMMUNITY)
Admission: RE | Admit: 2021-10-02 | Discharge: 2021-10-02 | Disposition: A | Discharge status: Home / Self Care | Payer: Medicare Other | Source: Ambulatory Visit | Attending: Internal Medicine | Admitting: Internal Medicine

## 2021-10-02 VITALS — Wt 201.5 lb

## 2021-10-02 DIAGNOSIS — J4540 Moderate persistent asthma, uncomplicated: Secondary | ICD-10-CM | POA: Diagnosis not present

## 2021-10-02 NOTE — Progress Notes (Signed)
Daily Session Note  Patient Details  Name: MAIREN WALLENSTEIN MRN: [id number] Date of Birth: 1940/10/13 Referring Provider:   Flowsheet Row PULMONARY REHAB OTHER RESP ORIENTATION from 09/02/2021 in [place]  Referring Provider Dr. Annamaria Boots       Encounter Date: 10/02/2021  Check In:  Session Check In - 10/02/21 1045       Check-In   Supervising physician immediately available to respond to emergencies [facility] MD immediately available    Physician(s) Dr. Johney Frame    Location [facility]    Staff Present Aundra Dubin, RN, BSN;Heather Otho Ket, BS, Exercise Physiologist;Dalton Kris Mouton, MS, ACSM-CEP, Exercise Physiologist    Virtual Visit No    Medication changes reported     No    Fall or balance concerns reported    Yes    Comments She has not fallen but walks with a cane for support. 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    Tobacco Cessation No Change    Warm-up and Cool-down Performed as group-led instruction    Resistance Training Performed Yes    VAD Patient? No    PAD/SET Patient? No      Pain Assessment   Currently in Pain? No/denies    Pain Score 0-No pain    Multiple Pain Sites No             Capillary Blood Glucose: No results found for this or any previous visit (from the past 24 hour(s)).    Social History   Tobacco Use  Smoking Status Never  Smokeless Tobacco Never    Goals Met:  Proper associated with RPD/PD & O2 Sat Independence with exercise equipment Using PLB without cueing & demonstrates good technique Exercise tolerated well No report of concerns or symptoms today Strength training completed today  Goals Unmet:  Not Applicable  Comments: Check out 1145.   Dr. Kathie Dike is Medical Director for [facility] Pulmonary Rehab.

## 2021-10-07 ENCOUNTER — Encounter (HOSPITAL_COMMUNITY)
Admission: RE | Admit: 2021-10-07 | Discharge: 2021-10-07 | Disposition: A | Discharge status: Home / Self Care | Payer: Medicare Other | Source: Ambulatory Visit | Attending: Internal Medicine | Admitting: Internal Medicine

## 2021-10-07 DIAGNOSIS — J4540 Moderate persistent asthma, uncomplicated: Secondary | ICD-10-CM

## 2021-10-07 NOTE — Progress Notes (Signed)
Daily Session Note  Patient Details  Name: [name] [name] MRN: [id number] Date of Birth: 12/15/1940 Referring Provider:   Flowsheet Row PULMONARY REHAB OTHER RESP ORIENTATION from 09/02/2021 in [place] CARDIAC REHABILITATION  Referring Provider [name] [name]       Encounter Date: 10/07/2021  Check In:  Session Check In - 10/07/21 1045       Check-In   Supervising physician immediately available to respond to emergencies [facility] MD immediately available    Physician(s) [name] [name]    Location [facility]    Staff Present [name] [name], BS, Exercise Physiologist;[name] Edwards, RN, BSN    Virtual Visit No    Medication changes reported     No    Fall or balance concerns reported    Yes    Comments She has not fallen but walks with a cane for support. 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    Tobacco Cessation No Change    Warm-up and Cool-down Performed as group-led instruction    Resistance Training Performed Yes    VAD Patient? No    PAD/SET Patient? No      Pain Assessment   Currently in Pain? No/denies    Pain Score 0-No pain    Multiple Pain Sites No             Capillary Blood Glucose: No results found for this or any previous visit (from the past 24 hour(s)).    Social History   Tobacco Use  Smoking Status Never  Smokeless Tobacco Never    Goals Met:  Independence with exercise equipment Exercise tolerated well No report of concerns or symptoms today Strength training completed today  Goals Unmet:  Not Applicable  Comments: check out 1145   [name] [name] is Medical Director for [place] Re[facility]. 

## 2021-10-08 ENCOUNTER — Ambulatory Visit (HOSPITAL_COMMUNITY)
Admission: RE | Admit: 2021-10-08 | Discharge: 2021-10-08 | Disposition: A | Discharge status: Home / Self Care | Payer: Medicare Other | Source: Ambulatory Visit | Attending: Nurse Practitioner | Admitting: Nurse Practitioner

## 2021-10-08 ENCOUNTER — Encounter (HOSPITAL_COMMUNITY): Payer: Self-pay | Admitting: Nurse Practitioner

## 2021-10-08 ENCOUNTER — Other Ambulatory Visit: Payer: Self-pay | Admitting: Cardiology

## 2021-10-08 VITALS — BP 142/50 | HR 57 | Ht 61.5 in | Wt 202.0 lb

## 2021-10-08 DIAGNOSIS — D6869 Other thrombophilia: Secondary | ICD-10-CM

## 2021-10-08 DIAGNOSIS — I10 Essential (primary) hypertension: Secondary | ICD-10-CM | POA: Insufficient documentation

## 2021-10-08 DIAGNOSIS — I498 Other specified cardiac arrhythmias: Secondary | ICD-10-CM | POA: Insufficient documentation

## 2021-10-08 DIAGNOSIS — I4891 Unspecified atrial fibrillation: Secondary | ICD-10-CM | POA: Diagnosis not present

## 2021-10-08 DIAGNOSIS — Z950 Presence of cardiac pacemaker: Secondary | ICD-10-CM | POA: Diagnosis not present

## 2021-10-08 DIAGNOSIS — I4892 Unspecified atrial flutter: Secondary | ICD-10-CM | POA: Insufficient documentation

## 2021-10-08 DIAGNOSIS — Z79899 Other long term (current) drug therapy: Secondary | ICD-10-CM | POA: Insufficient documentation

## 2021-10-08 NOTE — Progress Notes (Signed)
Primary Care Physician: Monico Blitz, MD Referring Physician: Dr. [name] is a 81 y.o. female with a h/o PPM,  afib, s/p afib ablation and on Tikosyn for further arrhythmia management. She is here for Tikosyn surveillance today. She saw Dr. Rayann Heman in early June at which time she was in [place] and her afib burden was 2% but he felt  her device may have been underdetecting  and changed her AF setting to AT/AF and sensitivity from least to less.  Today , her ekg shows SR today  and she states that she has felt overall felt well. Occasionally she will still have episodes  of fluttering and not feel well, but she feels improved from a heart rhythm standpoint  from a year ago. She is now going to pulmonary rehab and physical therapy several times a week and seeing improvement   Today, she denies symptoms of palpitations, chest pain, shortness of breath, orthopnea, PND, lower extremity edema, dizziness, presyncope, syncope, or neurologic sequela. The patient is tolerating medications without difficulties and is otherwise without complaint today.   Past Medical History:  Diagnosis Date   Allergic rhinitis    Anxiety    Asthma    Atrial flutter (HCC)    COPD (chronic obstructive pulmonary disease) ([place])    Depression    Essential hypertension    Hyperlipidemia    Lumbar disc disease    Obesity    OSA (obstructive sleep apnea)    CPAP   Paroxysmal atrial fibrillation (HCC)    Element of tachycardia bradycardia syndrome   Respiratory failure ([place])    Past Surgical History:  Procedure Laterality Date   ANTERIOR FUSION LUMBAR SPINE     ATRIAL FIBRILLATION ABLATION N/A 09/12/2020   Procedure: ATRIAL FIBRILLATION ABLATION;  Surgeon: Thompson Grayer, MD;  Location: [place] [facility];  Service: Cardiovascular;  Laterality: N/A;   BIOPSY  08/27/2016   Procedure: BIOPSY;  Surgeon: Rogene Houston, MD;  [facility];  Service: Endoscopy;;  FOUR GASTRIC POLYPS BIOPSIED    CARDIOVERSION N/A 03/28/2014   Procedure: CARDIOVERSION;  Surgeon: Fay Records, MD;  Location: [facility];  Service: Cardiovascular;  Laterality: N/A;   CARDIOVERSION N/A 10/22/2016   Procedure: CARDIOVERSION;  Surgeon: Sueanne Margarita, MD;  Location: [facility] ENDOSCOPY;  Service: Cardiovascular;  Laterality: N/A;   CARDIOVERSION N/A 11/26/2020   Procedure: CARDIOVERSION;  Surgeon: Fay Records, MD;  Location: [place];  Service: Cardiovascular;  Laterality: N/A;   CATARACT EXTRACTION     COLONOSCOPY N/A 08/04/2012   Procedure: COLONOSCOPY;  Surgeon: Rogene Houston, MD;  [facility];  Service: Endoscopy;  Laterality: N/A;  730-rescheduled to [place] notified pt   ELECTROPHYSIOLOGIC STUDY N/A 09/18/2014   Procedure: Atrial Fibrillation Ablation;  Surgeon: Thompson Grayer, MD;  Location: [place] [facility];  Service: Cardiovascular;  Laterality: N/A;   ESOPHAGOGASTRODUODENOSCOPY N/A 08/27/2016   Procedure: ESOPHAGOGASTRODUODENOSCOPY (EGD);  Surgeon: Rogene Houston, MD;  [facility];  Service: Endoscopy;  Laterality: N/A;  1200   implantable loop recorder placement  11/22/2020   Medtronic Reveal Linq model LNQ 11 (SN [id number]) implantable loop recorder   LASIK     Both eyes   TEE WITHOUT CARDIOVERSION N/A 09/18/2014   Procedure: TRANSESOPHAGEAL ECHOCARDIOGRAM (TEE);  Surgeon: Larey Dresser, MD;  Location: [place];  Service: Cardiovascular;  Laterality: N/A;   TEE WITHOUT CARDIOVERSION N/A 10/22/2016   Procedure: TRANSESOPHAGEAL ECHOCARDIOGRAM (TEE);  Surgeon: Sueanne Margarita, MD;  Location: [facility] ENDOSCOPY;  Service: Cardiovascular;  Laterality: N/A;   TEE WITHOUT CARDIOVERSION N/A 08/24/2017   Procedure: TRANSESOPHAGEAL ECHOCARDIOGRAM (TEE);  Surgeon: Josue Hector, MD;  Location: [facility] ENDOSCOPY;  Service: Cardiovascular;  Laterality: N/A;   TOTAL HIP ARTHROPLASTY  03/30/2008   Right    Current Outpatient Medications  Medication Sig Dispense Refill    acetaminophen (TYLENOL) 500 MG tablet Take 500 mg by mouth every 8 (eight) hours as needed for headache.     antiseptic oral rinse (BIOTENE) LIQD 15 mLs by Mouth Rinse route daily as needed for dry mouth.     atorvastatin (LIPITOR) 10 MG tablet Take 10 mg by mouth daily at 6 PM.     benzonatate (TESSALON) 200 MG capsule Take 1 capsule (200 mg total) by mouth 3 (three) times daily as needed for cough. 30 capsule 3   Biotin 1 MG CAPS Take 1,000 mg by mouth 3 (three) times a week.      budesonide (PULMICORT) 0.5 MG/2ML nebulizer solution Take 0.5 mg by nebulization daily. Additional if needed during the day     Calcium Carbonate-Vit D-Min (QC CALCIUM-MAGNESIUM-ZINC-D3) 333.4-133 MG-UNIT TABS Take 1 tablet by mouth 2 (two) times daily.      cetirizine (ZYRTEC) 10 MG tablet Take 10 mg by mouth at bedtime as needed for allergies.     clindamycin (CLEOCIN) 300 MG capsule Take 600 mg by mouth See admin instructions. Take prior to dental procedures  0   diltiazem (CARDIZEM CD) 120 MG 24 hr capsule Take 1 capsule (120 mg total) by mouth every evening.     diltiazem (CARDIZEM CD) 240 MG 24 hr capsule Take 1 capsule (240 mg total) by mouth every morning. 90 capsule 1   diltiazem (CARDIZEM) 30 MG tablet TAKE 1 TABLET BY MOUTH AS NEEDED FOR PALPITATIONS 45 tablet 2   dofetilide (TIKOSYN) 500 MCG capsule TAKE ONE CAPSULE BY MOUTH TWICE A DAY 180 capsule 1   EPINEPHrine 0.3 mg/0.3 mL IJ SOAJ injection Inject 0.3 mg into the muscle as needed for anaphylaxis.     famotidine (PEPCID) 20 MG tablet Take 2 tablets (40 mg total) by mouth at bedtime as needed. 30 tablet 1   fluticasone (FLONASE) 50 MCG/ACT nasal spray Place 1 spray into both nostrils 2 (two) times daily.     hydrALAZINE (APRESOLINE) 100 MG tablet Take 100 mg by mouth 2 (two) times daily.     HYDROcodone bit-homatropine (HYCODAN) 5-1.5 MG/5ML syrup Take 5 mLs by mouth every 6 (six) hours as needed for cough.     Hypertonic Nasal Wash (SINUS RINSE) PACK  Place 1 each into the nose daily as needed (clear nasal passage). Neilmed sinus rinse     ipratropium-albuterol (DUONEB) 0.5-2.5 (3) MG/3ML SOLN Take 3 mLs by nebulization 4 (four) times daily as needed (Shortness of breathing).     losartan (COZAAR) 100 MG tablet TAKE 1 TABLET BY MOUTH DAILY 90 tablet 3   Magnesium 250 MG TABS Take 250 mg by mouth daily.     montelukast (SINGULAIR) 10 MG tablet Take 10 mg by mouth at bedtime.     omeprazole (PRILOSEC) 40 MG capsule Take 1 capsule (40 mg total) by mouth daily. 60 capsule 2   Polyethyl Glycol-Propyl Glycol (SYSTANE) 0.4-0.3 % GEL ophthalmic gel Place 1 application. into both eyes daily as needed (Dry eye).     Polyethyl Glycol-Propyl Glycol (SYSTANE) 0.4-0.3 % SOLN Place 1 drop into both eyes daily as  needed (Dry eye).     potassium chloride SA (KLOR-CON M) 20 MEQ tablet TAKE 1 TABLET BY MOUTH DAILY 90 tablet 3   umeclidinium-vilanterol (ANORO ELLIPTA) 62.5-25 MCG/ACT AEPB INHALE ONE PUFF INTO THE LUNGS DAILY (Patient taking differently: 1 puff every morning.) 60 each 11   Wheat Dextrin (BENEFIBER ON THE GO) POWD Take 10 mLs by mouth daily.     XARELTO 20 MG TABS tablet TAKE 1 TABLET BY MOUTH DAILY with SUPPER 90 tablet 1   No current facility-administered medications for this encounter.    Allergies  Allergen Reactions   Bupropion Hcl Other (See Comments)    Suicidal Thoughts.    Escitalopram Oxalate Other (See Comments)    Suicidal Thoughts.    Fluticasone-Salmeterol Other (See Comments)    Advair - Caused patient to go into Afib.    Lisinopril Cough   Serevent Other (See Comments)    Caused patient to go into Afib    Social History   Socioeconomic History   Marital status: Single    Spouse name: Not on file   Number of children: Not on file   Years of education: Not on file   Highest education level: Not on file  Occupational History   Occupation: Retired: Presenter, broadcasting  Tobacco Use   Smoking status: Never   Smokeless  tobacco: Never  Vaping Use   Vaping Use: Never used  Substance and Sexual Activity   Alcohol use: No    Alcohol/week: 0.0 standard drinks of alcohol   Drug use: No   Sexual activity: Not on file  Other Topics Concern   Not on file  Social History Narrative   Pt lives in [place] alone. She was never married.   Retired Customer service manager.   Attends [employer]   Social Determinants of Health   Financial Resource Strain: Not on [employer] Insecurity: Not on file  Transportation Needs: Not on file  Physical Activity: Not on file  Stress: Not on file  Social Connections: Not on file  Intimate Partner Violence: Not on file    Family History  Problem Relation Age of Onset   Cancer Mother    Heart attack Father    Colon cancer Other     ROS- All systems are reviewed and negative except as per the HPI above  Physical Exam: There were no vitals filed for this visit. Wt Readings from Last 3 Encounters:  10/02/21 91.4 kg  09/25/21 90.2 kg  09/24/21 90.1 kg    Labs: Lab Results  Component Value Date   NA 143 09/08/2021   K 3.9 09/08/2021   CL 107 (H) 09/08/2021   CO2 20 09/08/2021   GLUCOSE 80 09/08/2021   BUN 20 09/08/2021   CREATININE 0.90 09/08/2021   CALCIUM 9.2 09/08/2021   MG 2.3 09/08/2021   Lab Results  Component Value Date   INR 2.0 (H) 09/19/2019   No results found for: "CHOL", "HDL", "LDLCALC", "TRIG"   GEN- The patient is well appearing, alert and oriented x 3 today.   Head- normocephalic, atraumatic Eyes-  Sclera clear, conjunctiva pink Ears- hearing intact Oropharynx- clear Neck- supple, no JVP Lymph- no cervical lymphadenopathy Lungs- Clear to ausculation bilaterally, normal work of breathing Heart- Regular rate and rhythm, no murmurs, rubs or gallops, PMI not laterally displaced GI- soft, NT, ND, + BS Extremities- no clubbing, cyanosis, or edema MS- no significant deformity or atrophy Skin- no rash or lesion Psych- euthymic  mood, full  affect Neuro- strength and sensation are intact  EKG-Vent. rate 57 BPM PR interval * ms QRS duration 110 ms QT/QTcB 472/459 ms P-R-T axes * -52 63 Undetermined rhythm, probable SR Left anterior fascicular block Moderate voltage criteria for LVH, may be normal variant ( R in aVL , Cornell product ) Abnormal ECG When compared with ECG of 16-Apr-2021 10:09, PREVIOUS ECG IS PRESENT    Assessment and Plan:  1. Afib  She is in SR Continue current management with Tikosyn 500 mcg bid and cardizem 120 mg pm and 240 mg pm  Qt stable  Bmet/mag drawn in June reviewed with stable mag and K+   2. CHA2DS2VASc  score of at least 4 Continue xarelto 20 mg daily   3. HTN  Stable   F/u with Dr. Rayann Heman in 6 months   Geroge Baseman. Yalexa Blust, [facility] [address] [place], [place] [address] [phone]

## 2021-10-08 NOTE — Telephone Encounter (Signed)
Prescription refill request for Xarelto received.  Indication: PAF Last office visit: 09/25/21 Myles Gip MD Weight: 90.2kg Age: 81 Scr: 1.19 on 09/08/21 CrCl: 52.80  Based on above findings Xarelto '20mg'$  daily is the appropriate dose.  Refill approved.

## 2021-10-09 ENCOUNTER — Encounter (HOSPITAL_COMMUNITY)
Admission: RE | Admit: 2021-10-09 | Discharge: 2021-10-09 | Disposition: A | Discharge status: Home / Self Care | Payer: Medicare Other | Source: Ambulatory Visit | Attending: Internal Medicine | Admitting: Internal Medicine

## 2021-10-09 DIAGNOSIS — J4540 Moderate persistent asthma, uncomplicated: Secondary | ICD-10-CM

## 2021-10-09 NOTE — Progress Notes (Signed)
Daily Session Note  Patient Details  Name: [name] [name] MRN: [id number] Date of Birth: 04/10/1940 Referring Provider:   Flowsheet Row PULMONARY REHAB OTHER RESP ORIENTATION from 09/02/2021 in [place]  Referring Provider Dr. Annamaria Boots       Encounter Date: 10/09/2021  Check In:  Session Check In - 10/09/21 1045       Check-In   Supervising physician immediately available to respond to emergencies [facility] MD immediately available    Physician(s) Dr Radford Pax    Location [facility]    Staff Present Redge Gainer, BS, Exercise Physiologist;Tenicia Gural Hassell Done, RN, Jennye Moccasin, RN, BSN    Virtual Visit No    Medication changes reported     No    Fall or balance concerns reported    Yes    Comments She has not fallen but walks with a cane for support. 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    Tobacco Cessation No Change    Warm-up and Cool-down Performed as group-led instruction    Resistance Training Performed Yes    VAD Patient? No    PAD/SET Patient? No      Pain Assessment   Currently in Pain? No/denies    Pain Score 0-No pain    Multiple Pain Sites No             Capillary Blood Glucose: No results found for this or any previous visit (from the past 24 hour(s)).    Social History   Tobacco Use  Smoking Status Never  Smokeless Tobacco Never    Goals Met:  Independence with exercise equipment Exercise tolerated well No report of concerns or symptoms today Strength training completed today  Goals Unmet:  Not Applicable  Comments: Checkout at 1145.   Dr. Kathie Dike is Medical Director for [facility] Pul[facility].

## 2021-10-14 ENCOUNTER — Encounter (HOSPITAL_COMMUNITY)
Admission: RE | Admit: 2021-10-14 | Discharge: 2021-10-14 | Disposition: A | Discharge status: Home / Self Care | Payer: Medicare Other | Source: Ambulatory Visit | Attending: Internal Medicine | Admitting: Internal Medicine

## 2021-10-14 VITALS — Wt 198.6 lb

## 2021-10-14 DIAGNOSIS — J4540 Moderate persistent asthma, uncomplicated: Secondary | ICD-10-CM | POA: Diagnosis not present

## 2021-10-14 NOTE — Progress Notes (Signed)
Daily Session Note  Patient Details  Name: [name] [name] MRN: [id number] Date of Birth: 1941/02/25 Referring Provider:   Flowsheet Row PULMONARY REHAB OTHER RESP ORIENTATION from 09/02/2021 in [place]  Referring Provider Dr. Annamaria Boots       Encounter Date: 10/14/2021  Check In:  Session Check In - 10/14/21 1045       Check-In   Supervising physician immediately available to respond to emergencies [facility] MD immediately available    Physician(s) Dr. Harl Bowie    Location [facility]    Staff Present Redge Gainer, BS, Exercise Physiologist;Kazue Cerro Kris Mouton, MS, ACSM-CEP, Exercise Physiologist    Virtual Visit No    Medication changes reported     No    Fall or balance concerns reported    Yes    Comments She has not fallen but walks with a cane for support. 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    Tobacco Cessation No Change    Warm-up and Cool-down Performed as group-led instruction    Resistance Training Performed Yes    VAD Patient? No    PAD/SET Patient? No      Pain Assessment   Currently in Pain? No/denies    Pain Score 0-No pain    Multiple Pain Sites No             Capillary Blood Glucose: No results found for this or any previous visit (from the past 24 hour(s)).    Social History   Tobacco Use  Smoking Status Never  Smokeless Tobacco Never    Goals Met:  Proper associated with RPD/PD & O2 Sat Independence with exercise equipment Using PLB without cueing & demonstrates good technique Exercise tolerated well Queuing for purse lip breathing No report of concerns or symptoms today Strength training completed today  Goals Unmet:  Not Applicable  Comments: checkout time is 1145   Dr. Kathie Dike is Medical Director for [facility] Pulmonary Re[facility].

## 2021-10-15 NOTE — Progress Notes (Signed)
Pulmonary Individual Treatment Plan  Patient Details  Name: [name] [name] MRN: [id number] Date of Birth: 1941/01/24 Referring Provider:   Flowsheet Row PULMONARY REHAB OTHER RESP ORIENTATION from 09/02/2021 in [place]  Referring Provider Dr. Annamaria Boots       Initial Encounter Date:  Flowsheet Row PULMONARY REHAB OTHER RESP ORIENTATION from 09/02/2021 in [place]  Date 09/02/21       Visit Diagnosis: Moderate persistent asthma, unspecified whether complicated  Patient's Home Medications on Admission:   Current Outpatient Medications:    acetaminophen (TYLENOL) 500 MG tablet, Take 500 mg by mouth every 8 (eight) hours as needed for headache., Disp: , Rfl:    antiseptic oral rinse (BIOTENE) LIQD, 15 mLs by Mouth Rinse route daily as needed for dry mouth., Disp: , Rfl:    atorvastatin (LIPITOR) 10 MG tablet, Take 10 mg by mouth daily at 6 PM., Disp: , Rfl:    benzonatate (TESSALON) 200 MG capsule, Take 1 capsule (200 mg total) by mouth 3 (three) times daily as needed for cough., Disp: 30 capsule, Rfl: 3   Biotin 1 MG CAPS, Take 1,000 mg by mouth 3 (three) times a week. , Disp: , Rfl:    budesonide (PULMICORT) 0.5 MG/2ML nebulizer solution, Take 0.5 mg by nebulization daily. Additional if needed during the day, Disp: , Rfl:    Calcium Carbonate-Vit D-Min (QC CALCIUM-MAGNESIUM-ZINC-D3) 333.4-133 MG-UNIT TABS, Take 1 tablet by mouth 2 (two) times daily. , Disp: , Rfl:    cetirizine (ZYRTEC) 10 MG tablet, Take 10 mg by mouth at bedtime as needed for allergies., Disp: , Rfl:    clindamycin (CLEOCIN) 300 MG capsule, Take 600 mg by mouth See admin instructions. Take prior to dental procedures, Disp: , Rfl: 0   diltiazem (CARDIZEM CD) 120 MG 24 hr capsule, Take 1 capsule (120 mg total) by mouth every evening., Disp: , Rfl:    diltiazem (CARDIZEM CD) 240 MG 24 hr capsule, Take 1 capsule (240 mg total) by mouth every morning., Disp: 90 capsule, Rfl:  1   diltiazem (CARDIZEM) 30 MG tablet, TAKE 1 TABLET BY MOUTH AS NEEDED FOR PALPITATIONS, Disp: 45 tablet, Rfl: 2   dofetilide (TIKOSYN) 500 MCG capsule, TAKE ONE CAPSULE BY MOUTH TWICE A DAY, Disp: 180 capsule, Rfl: 1   EPINEPHrine 0.3 mg/0.3 mL IJ SOAJ injection, Inject 0.3 mg into the muscle as needed for anaphylaxis., Disp: , Rfl:    famotidine (PEPCID) 20 MG tablet, Take 2 tablets (40 mg total) by mouth at bedtime as needed., Disp: 30 tablet, Rfl: 1   fluticasone (FLONASE) 50 MCG/ACT nasal spray, Place 1 spray into both nostrils 2 (two) times daily., Disp: , Rfl:    hydrALAZINE (APRESOLINE) 100 MG tablet, Take 100 mg by mouth 2 (two) times daily., Disp: , Rfl:    HYDROcodone bit-homatropine (HYCODAN) 5-1.5 MG/5ML syrup, Take 5 mLs by mouth every 6 (six) hours as needed for cough., Disp: , Rfl:    Hypertonic Nasal Wash (SINUS RINSE) PACK, Place 1 each into the nose daily as needed (clear nasal passage). Neilmed sinus rinse, Disp: , Rfl:    ipratropium-albuterol (DUONEB) 0.5-2.5 (3) MG/3ML SOLN, Take 3 mLs by nebulization 4 (four) times daily as needed (Shortness of breathing)., Disp: , Rfl:    losartan (COZAAR) 100 MG tablet, TAKE 1 TABLET BY MOUTH DAILY, Disp: 90 tablet, Rfl: 3   Magnesium 250 MG TABS, Take 250 mg by mouth daily., Disp: , Rfl:    montelukast (SINGULAIR)  10 MG tablet, Take 10 mg by mouth at bedtime., Disp: , Rfl:    omeprazole (PRILOSEC) 40 MG capsule, Take 1 capsule (40 mg total) by mouth daily., Disp: 60 capsule, Rfl: 2   Polyethyl Glycol-Propyl Glycol (SYSTANE) 0.4-0.3 % GEL ophthalmic gel, Place 1 application. into both eyes daily as needed (Dry eye)., Disp: , Rfl:    Polyethyl Glycol-Propyl Glycol (SYSTANE) 0.4-0.3 % SOLN, Place 1 drop into both eyes daily as needed (Dry eye)., Disp: , Rfl:    potassium chloride SA (KLOR-CON M) 20 MEQ tablet, TAKE 1 TABLET BY MOUTH DAILY, Disp: 90 tablet, Rfl: 3   umeclidinium-vilanterol (ANORO ELLIPTA) 62.5-25 MCG/ACT AEPB, INHALE ONE PUFF  INTO THE LUNGS DAILY (Patient taking differently: 1 puff every morning.), Disp: 60 each, Rfl: 11   Wheat Dextrin (BENEFIBER ON THE GO) POWD, Take 10 mLs by mouth daily., Disp: , Rfl:    XARELTO 20 MG TABS tablet, TAKE 1 TABLET BY MOUTH DAILY with SUPPER, Disp: 90 tablet, Rfl: 1  Past Medical History: Past Medical History:  Diagnosis Date   Allergic rhinitis    Anxiety    Asthma    Atrial flutter (HCC)    COPD (chronic obstructive pulmonary disease) (HCC)    Depression    Essential hypertension    Hyperlipidemia    Lumbar disc disease    Obesity    OSA (obstructive sleep apnea)    CPAP   Paroxysmal atrial fibrillation (HCC)    Element of tachycardia bradycardia syndrome   Respiratory failure (HCC)     Tobacco Use: Social History   Tobacco Use  Smoking Status Never  Smokeless Tobacco Never    Labs: Review Flowsheet       Latest Ref Rng & Units 11/26/2020  Labs for ITP Cardiac and Pulmonary Rehab  TCO2 22 - 32 mmol/L 23     Capillary Blood Glucose: No results found for: "GLUCAP"   Pulmonary Assessment Scores:  Pulmonary Assessment Scores     Row Name 09/02/21 0824         ADL UCSD   ADL Phase Entry     SOB Score total 82     Rest 1     Walk 3     Stairs 5     Bath 4     Dress 3     Shop 3       CAT Score   CAT Score 15       mMRC Score   mMRC Score 3             UCSD: Self-administered rating of dyspnea associated with activities of daily living (ADLs) 6-point scale (0 = "not at all" to 5 = "maximal or unable to do because of breathlessness")  Scoring Scores range from 0 to 120.  Minimally important difference is 5 units  CAT: CAT can identify the health impairment of COPD patients and is better correlated with disease progression.  CAT has a scoring range of zero to 40. The CAT score is classified into four groups of low (less than 10), medium (10 - 20), high (21-30) and very high (31-40) based on the impact level of disease on health  status. A CAT score over 10 suggests significant symptoms.  A worsening CAT score could be explained by an exacerbation, poor medication adherence, poor inhaler technique, or progression of COPD or comorbid conditions.  CAT MCID is 2 points  mMRC: mMRC (Modified Medical Research Council) Dyspnea Scale is used to assess  the degree of baseline functional disability in patients of respiratory disease due to dyspnea. No minimal important difference is established. A decrease in score of 1 point or greater is considered a positive change.   Pulmonary Function Assessment:   Exercise Target Goals: Exercise Program Goal: Individual exercise prescription set using results from initial 6 min walk test and THRR while considering  patient's activity barriers and safety.   Exercise Prescription Goal: Initial exercise prescription builds to 30-45 minutes a day of aerobic activity, 2-3 days per week.  Home exercise guidelines will be given to patient during program as part of exercise prescription that the participant will acknowledge.  Activity Barriers & Risk Stratification:  Activity Barriers & Cardiac Risk Stratification - 09/02/21 0852       Activity Barriers & Cardiac Risk Stratification   Activity Barriers Right Hip Replacement;Left Hip Replacement;Arthritis;Back Problems;Joint Problems;Deconditioning;Shortness of Breath;Balance Concerns;Other (comment)    Comments hammer toes, atrial fibrillation    Cardiac Risk Stratification Moderate             6 Minute Walk:  6 Minute Walk     Row Name 09/02/21 0851         6 Minute Walk   Phase Initial     Distance 750 feet     Walk Time 6 minutes     # of Rest Breaks 0     MPH 1.42     METS 1.22     RPE 13     Perceived Dyspnea  15     VO2 Peak 4.29     Symptoms No     Resting HR 59 bpm     Resting BP 148/62     Resting Oxygen Saturation  98 %     Exercise Oxygen Saturation  during 6 min walk 96 %     Max Ex. HR 120 bpm     Max  Ex. BP 158/60     2 Minute Post BP 120/56       Interval HR   1 Minute HR 110     2 Minute HR 109     3 Minute HR 119     4 Minute HR 116     5 Minute HR 116     6 Minute HR 120     2 Minute Post HR 68     Interval Heart Rate? Yes       Interval Oxygen   Interval Oxygen? Yes     Baseline Oxygen Saturation % 98 %     1 Minute Oxygen Saturation % 97 %     1 Minute Liters of Oxygen 0 L     2 Minute Oxygen Saturation % 97 %     2 Minute Liters of Oxygen 0 L     3 Minute Oxygen Saturation % 96 %     3 Minute Liters of Oxygen 0 L     4 Minute Oxygen Saturation % 97 %     4 Minute Liters of Oxygen 0 L     5 Minute Oxygen Saturation % 97 %     5 Minute Liters of Oxygen 0 L     6 Minute Oxygen Saturation % 97 %     6 Minute Liters of Oxygen 0 L     2 Minute Post Oxygen Saturation % 98 %     2 Minute Post Liters of Oxygen 0 L  Oxygen Initial Assessment:  Oxygen Initial Assessment - 09/02/21 0921       Initial 6 min Walk   Oxygen Used None      Program Oxygen Prescription   Program Oxygen Prescription None      Intervention   Short Term Goals To learn and understand importance of maintaining oxygen saturations>88%;To learn and understand importance of monitoring SPO2 with pulse oximeter and demonstrate accurate use of the pulse oximeter.;To learn and demonstrate proper pursed lip breathing techniques or other breathing techniques.     Long  Term Goals Maintenance of O2 saturations>88%;Verbalizes importance of monitoring SPO2 with pulse oximeter and return demonstration;Exhibits proper breathing techniques, such as pursed lip breathing or other method taught during program session             Oxygen Re-Evaluation:  Oxygen Re-Evaluation     Row Name 09/16/21 1249 10/14/21 1307           Program Oxygen Prescription   Program Oxygen Prescription None None        Home Oxygen   Home Oxygen Device None None      Sleep Oxygen Prescription CPAP CPAP       Liters per minute 0 0      Home Exercise Oxygen Prescription None None      Home Resting Oxygen Prescription None None      Compliance with Home Oxygen Use Yes Yes        Goals/Expected Outcomes   Short Term Goals To learn and understand importance of maintaining oxygen saturations>88%;To learn and understand importance of monitoring SPO2 with pulse oximeter and demonstrate accurate use of the pulse oximeter.;To learn and demonstrate proper pursed lip breathing techniques or other breathing techniques.  To learn and understand importance of maintaining oxygen saturations>88%;To learn and understand importance of monitoring SPO2 with pulse oximeter and demonstrate accurate use of the pulse oximeter.;To learn and demonstrate proper pursed lip breathing techniques or other breathing techniques.       Long  Term Goals Maintenance of O2 saturations>88%;Verbalizes importance of monitoring SPO2 with pulse oximeter and return demonstration;Exhibits proper breathing techniques, such as pursed lip breathing or other method taught during program session Maintenance of O2 saturations>88%;Verbalizes importance of monitoring SPO2 with pulse oximeter and return demonstration;Exhibits proper breathing techniques, such as pursed lip breathing or other method taught during program session      Goals/Expected Outcomes complinace complinace               Oxygen Discharge (Final Oxygen Re-Evaluation):  Oxygen Re-Evaluation - 10/14/21 1307       Program Oxygen Prescription   Program Oxygen Prescription None      Home Oxygen   Home Oxygen Device None    Sleep Oxygen Prescription CPAP    Liters per minute 0    Home Exercise Oxygen Prescription None    Home Resting Oxygen Prescription None    Compliance with Home Oxygen Use Yes      Goals/Expected Outcomes   Short Term Goals To learn and understand importance of maintaining oxygen saturations>88%;To learn and understand importance of monitoring SPO2 with  pulse oximeter and demonstrate accurate use of the pulse oximeter.;To learn and demonstrate proper pursed lip breathing techniques or other breathing techniques.     Long  Term Goals Maintenance of O2 saturations>88%;Verbalizes importance of monitoring SPO2 with pulse oximeter and return demonstration;Exhibits proper breathing techniques, such as pursed lip breathing or other method taught during program session    Goals/Expected Outcomes  complinace             Initial Exercise Prescription:  Initial Exercise Prescription - 09/02/21 0800       Date of Initial Exercise RX and Referring Provider   Date 09/02/21    Referring Provider Dr. Annamaria Boots    Expected Discharge Date 01/06/22      Treadmill   MPH 1    Grade 0    Minutes 17      NuStep   Level 1    SPM 70    Minutes 22      Prescription Details   Frequency (times per week) 2    Duration Progress to 30 minutes of continuous aerobic without signs/symptoms of physical distress      Intensity   THRR 40-80% of Max Heartrate 56-111    Ratings of Perceived Exertion 11-13    Perceived Dyspnea 0-4      Resistance Training   Training Prescription Yes    Weight 2    Reps 10-15             Perform Capillary Blood Glucose checks as needed.  Exercise Prescription Changes:   Exercise Prescription Changes     Row Name 09/16/21 1200 10/02/21 1500 10/14/21 1300         Response to Exercise   Blood Pressure (Admit) 130/60 112/50 120/58     Blood Pressure (Exercise) 160/50 180/50 162/50     Blood Pressure (Exit) 110/50 110/80 112/56     Heart Rate (Admit) 62 bpm 86 bpm 64 bpm     Heart Rate (Exercise) 110 bpm 101 bpm 80 bpm     Heart Rate (Exit) 59 bpm 70 bpm 61 bpm     Oxygen Saturation (Admit) 98 % 97 % 97 %     Oxygen Saturation (Exercise) 95 % 95 % 95 %     Oxygen Saturation (Exit) 98 % 97 % 97 %     Rating of Perceived Exertion (Exercise) '15 13 12     '$ Perceived Dyspnea (Exercise) '15 12 13     '$ Duration Continue  with 30 min of aerobic exercise without signs/symptoms of physical distress. Continue with 30 min of aerobic exercise without signs/symptoms of physical distress. Continue with 30 min of aerobic exercise without signs/symptoms of physical distress.     Intensity THRR unchanged THRR unchanged THRR unchanged       Progression   Progression Continue to progress workloads to maintain intensity without signs/symptoms of physical distress. Continue to progress workloads to maintain intensity without signs/symptoms of physical distress. Continue to progress workloads to maintain intensity without signs/symptoms of physical distress.       Resistance Training   Training Prescription Yes Yes Yes     Weight '2 3 3     '$ Reps 10-15 10-15 10-15     Time 10 Minutes 10 Minutes 10 Minutes       Treadmill   MPH 1.2 1.2 1.3     Grade 0 0 0     Minutes '17 17 17     '$ METs 1.92 1.92 2       NuStep   Level '1 2 2     '$ SPM 97 104 108     Minutes '22 22 22     '$ METs 2 2.1 2.3              Exercise Comments:   Exercise Goals and Review:   Exercise Goals     Row Name  09/02/21 0854 09/16/21 1247 10/14/21 1304         Exercise Goals   Increase Physical Activity Yes Yes Yes     Intervention Provide advice, education, support and counseling about physical activity/exercise needs.;Develop an individualized exercise prescription for aerobic and resistive training based on initial evaluation findings, risk stratification, comorbidities and participant's personal goals. Provide advice, education, support and counseling about physical activity/exercise needs.;Develop an individualized exercise prescription for aerobic and resistive training based on initial evaluation findings, risk stratification, comorbidities and participant's personal goals. Provide advice, education, support and counseling about physical activity/exercise needs.;Develop an individualized exercise prescription for aerobic and resistive training  based on initial evaluation findings, risk stratification, comorbidities and participant's personal goals.     Expected Outcomes Short Term: Attend rehab on a regular basis to increase amount of physical activity.;Long Term: Add in home exercise to make exercise part of routine and to increase amount of physical activity.;Long Term: Exercising regularly at least 3-5 days a week. Short Term: Attend rehab on a regular basis to increase amount of physical activity.;Long Term: Add in home exercise to make exercise part of routine and to increase amount of physical activity.;Long Term: Exercising regularly at least 3-5 days a week. Short Term: Attend rehab on a regular basis to increase amount of physical activity.;Long Term: Add in home exercise to make exercise part of routine and to increase amount of physical activity.;Long Term: Exercising regularly at least 3-5 days a week.     Increase Strength and Stamina Yes Yes Yes     Intervention Provide advice, education, support and counseling about physical activity/exercise needs.;Develop an individualized exercise prescription for aerobic and resistive training based on initial evaluation findings, risk stratification, comorbidities and participant's personal goals. Provide advice, education, support and counseling about physical activity/exercise needs.;Develop an individualized exercise prescription for aerobic and resistive training based on initial evaluation findings, risk stratification, comorbidities and participant's personal goals. Provide advice, education, support and counseling about physical activity/exercise needs.;Develop an individualized exercise prescription for aerobic and resistive training based on initial evaluation findings, risk stratification, comorbidities and participant's personal goals.     Expected Outcomes Short Term: Increase workloads from initial exercise prescription for resistance, speed, and METs.;Short Term: Perform resistance  training exercises routinely during rehab and add in resistance training at home;Long Term: Improve cardiorespiratory fitness, muscular endurance and strength as measured by increased METs and functional capacity (6MWT) Short Term: Increase workloads from initial exercise prescription for resistance, speed, and METs.;Short Term: Perform resistance training exercises routinely during rehab and add in resistance training at home;Long Term: Improve cardiorespiratory fitness, muscular endurance and strength as measured by increased METs and functional capacity (6MWT) Short Term: Increase workloads from initial exercise prescription for resistance, speed, and METs.;Short Term: Perform resistance training exercises routinely during rehab and add in resistance training at home;Long Term: Improve cardiorespiratory fitness, muscular endurance and strength as measured by increased METs and functional capacity (6MWT)     Able to understand and use rate of perceived exertion (RPE) scale Yes Yes Yes     Intervention Provide education and explanation on how to use RPE scale Provide education and explanation on how to use RPE scale Provide education and explanation on how to use RPE scale     Expected Outcomes Short Term: Able to use RPE daily in rehab to express subjective intensity level;Long Term:  Able to use RPE to guide intensity level when exercising independently Short Term: Able to use RPE daily in rehab to express  subjective intensity level;Long Term:  Able to use RPE to guide intensity level when exercising independently Short Term: Able to use RPE daily in rehab to express subjective intensity level;Long Term:  Able to use RPE to guide intensity level when exercising independently     Able to understand and use Dyspnea scale Yes Yes Yes     Intervention Provide education and explanation on how to use Dyspnea scale Provide education and explanation on how to use Dyspnea scale Provide education and explanation on  how to use Dyspnea scale     Expected Outcomes Short Term: Able to use Dyspnea scale daily in rehab to express subjective sense of shortness of breath during exertion;Long Term: Able to use Dyspnea scale to guide intensity level when exercising independently Short Term: Able to use Dyspnea scale daily in rehab to express subjective sense of shortness of breath during exertion;Long Term: Able to use Dyspnea scale to guide intensity level when exercising independently Short Term: Able to use Dyspnea scale daily in rehab to express subjective sense of shortness of breath during exertion;Long Term: Able to use Dyspnea scale to guide intensity level when exercising independently     Knowledge and understanding of Target Heart Rate Range (THRR) Yes Yes Yes     Intervention Provide education and explanation of THRR including how the numbers were predicted and where they are located for reference Provide education and explanation of THRR including how the numbers were predicted and where they are located for reference Provide education and explanation of THRR including how the numbers were predicted and where they are located for reference     Expected Outcomes Long Term: Able to use THRR to govern intensity when exercising independently;Short Term: Able to use daily as guideline for intensity in rehab;Short Term: Able to state/look up THRR Long Term: Able to use THRR to govern intensity when exercising independently;Short Term: Able to use daily as guideline for intensity in rehab;Short Term: Able to state/look up THRR Long Term: Able to use THRR to govern intensity when exercising independently;Short Term: Able to use daily as guideline for intensity in rehab;Short Term: Able to state/look up THRR     Understanding of Exercise Prescription Yes Yes Yes     Intervention Provide education, explanation, and written materials on patient's individual exercise prescription Provide education, explanation, and written  materials on patient's individual exercise prescription Provide education, explanation, and written materials on patient's individual exercise prescription     Expected Outcomes Short Term: Able to explain program exercise prescription;Long Term: Able to explain home exercise prescription to exercise independently Short Term: Able to explain program exercise prescription;Long Term: Able to explain home exercise prescription to exercise independently Short Term: Able to explain program exercise prescription;Long Term: Able to explain home exercise prescription to exercise independently              Exercise Goals Re-Evaluation :  Exercise Goals Re-Evaluation     Row Name 09/16/21 1247 10/14/21 1305           Exercise Goal Re-Evaluation   Exercise Goals Review Increase Physical Activity;Increase Strength and Stamina;Able to understand and use rate of perceived exertion (RPE) scale;Able to understand and use Dyspnea scale;Knowledge and understanding of Target Heart Rate Range (THRR);Understanding of Exercise Prescription Increase Physical Activity;Increase Strength and Stamina;Able to understand and use rate of perceived exertion (RPE) scale;Able to understand and use Dyspnea scale;Knowledge and understanding of Target Heart Rate Range (THRR);Understanding of Exercise Prescription      Comments  Pt has completed 4 sessions in pulmonary rehab. She is motivated and wants to push herself during class to increase her workloads. She has notived that she is able to do yard work outside without having to stop frequently due to SOB. She is currently exercising at 2.0 METs on the stepper. Will continue to monitor and progress as able. Pt has completed 11 sessions of PR. She continues to be motivated and pushes herself during class and has increased her workload. She goes to physical therapy two days a week. She is currently exercising at 2.3 METs on the stepper.      Expected Outcomes Through exercise at  rehab and at home, the patient will meet their stated goals. Through exercise at rehab and at home, the patient will meet their stated goals.               Discharge Exercise Prescription (Final Exercise Prescription Changes):  Exercise Prescription Changes - 10/14/21 1300       Response to Exercise   Blood Pressure (Admit) 120/58    Blood Pressure (Exercise) 162/50    Blood Pressure (Exit) 112/56    Heart Rate (Admit) 64 bpm    Heart Rate (Exercise) 80 bpm    Heart Rate (Exit) 61 bpm    Oxygen Saturation (Admit) 97 %    Oxygen Saturation (Exercise) 95 %    Oxygen Saturation (Exit) 97 %    Rating of Perceived Exertion (Exercise) 12    Perceived Dyspnea (Exercise) 13    Duration Continue with 30 min of aerobic exercise without signs/symptoms of physical distress.    Intensity THRR unchanged      Progression   Progression Continue to progress workloads to maintain intensity without signs/symptoms of physical distress.      Resistance Training   Training Prescription Yes    Weight 3    Reps 10-15    Time 10 Minutes      Treadmill   MPH 1.3    Grade 0    Minutes 17    METs 2      NuStep   Level 2    SPM 108    Minutes 22    METs 2.3             Nutrition:  Target Goals: Understanding of nutrition guidelines, daily intake of sodium '1500mg'$ , cholesterol '200mg'$ , calories 30% from fat and 7% or less from saturated fats, daily to have 5 or more servings of fruits and vegetables.  Biometrics:  Pre Biometrics - 09/02/21 0855       Pre Biometrics   Height 5' 1.5" (1.562 m)    Weight 89.2 kg    Waist Circumference 46 inches    Hip Circumference 47 inches    Waist to Hip Ratio 0.98 %    BMI (Calculated) 36.56    Triceps Skinfold 8 mm    % Body Fat 42.7 %    Grip Strength 24.8 kg    Flexibility 0 in    Single Leg Stand 0 seconds              Nutrition Therapy Plan and Nutrition Goals:  Nutrition Therapy & Goals - 09/17/21 0727       Personal  Nutrition Goals   Comments Patient scored 71 on his diet assessment. We offer 2 educational sessions on healthy nutrition with handouts and assistance with RD referral if patient is interested.      Intervention Plan   Intervention Nutrition handout(s)  given to patient.    Expected Outcomes Short Term Goal: Understand basic principles of dietary content, such as calories, fat, sodium, cholesterol and nutrients.             Nutrition Assessments:  Nutrition Assessments - 09/02/21 0827       MEDFICTS Scores   Pre Score 71            MEDIFICTS Score Key: ?70 Need to make dietary changes  40-70 Heart Healthy Diet ? 40 Therapeutic Level Cholesterol Diet   Picture Your Plate Scores: <54 Unhealthy dietary pattern with much room for improvement. 41-50 Dietary pattern unlikely to meet recommendations for good health and room for improvement. 51-60 More healthful dietary pattern, with some room for improvement.  >60 Healthy dietary pattern, although there may be some specific behaviors that could be improved.    Nutrition Goals Re-Evaluation:   Nutrition Goals Discharge (Final Nutrition Goals Re-Evaluation):   Psychosocial: Target Goals: Acknowledge presence or absence of significant depression and/or stress, maximize coping skills, provide positive support system. Participant is able to verbalize types and ability to use techniques and skills needed for reducing stress and depression.  Initial Review & Psychosocial Screening:  Initial Psych Review & Screening - 09/02/21 0854       Initial Review   Current issues with None Identified      Family Dynamics   Good Support System? Yes    Comments Her support system includes her two nieces and the members of her church.      Barriers   Psychosocial barriers to participate in program There are no identifiable barriers or psychosocial needs.      Screening Interventions   Interventions Encouraged to exercise    Expected  Outcomes Long Term goal: The participant improves quality of Life and PHQ9 Scores as seen by post scores and/or verbalization of changes;Short Term goal: Identification and review with participant of any Quality of Life or Depression concerns found by scoring the questionnaire.             Quality of Life Scores:  Quality of Life - 09/02/21 0856       Quality of Life   Select Quality of Life      Quality of Life Scores   Health/Function Pre 23.2 %    Socioeconomic Pre 26.08 %    Psych/Spiritual Pre 24.64 %    Family Pre 23.5 %    GLOBAL Pre 24.11 %            Scores of 19 and below usually indicate a poorer quality of life in these areas.  A difference of  2-3 points is a clinically meaningful difference.  A difference of 2-3 points in the total score of the Quality of Life Index has been associated with significant improvement in overall quality of life, self-image, physical symptoms, and general health in studies assessing change in quality of life.   PHQ-9: Review Flowsheet       09/02/2021 03/18/2018 10/01/2017  Depression screen PHQ 2/9  Decreased Interest 0 0 0  Down, Depressed, Hopeless 0 0 0  PHQ - 2 Score 0 0 0  Altered sleeping '2 1 2  '$ Tired, decreased energy '2 2 2  '$ Change in appetite 0 2 2  Feeling bad or failure about yourself  0 0 0  Trouble concentrating 0 2 0  Moving slowly or fidgety/restless 0 0 0  Suicidal thoughts 0 0 0  PHQ-9 Score '4 7 6  '$ Difficult  doing work/chores Somewhat difficult Not difficult at all Not difficult at all   Interpretation of Total Score  Total Score Depression Severity:  1-4 = Minimal depression, 5-9 = Mild depression, 10-14 = Moderate depression, 15-19 = Moderately severe depression, 20-27 = Severe depression   Psychosocial Evaluation and Intervention:  Psychosocial Evaluation - 09/02/21 0922       Psychosocial Evaluation & Interventions   Interventions Encouraged to exercise with the program and follow exercise  prescription    Comments Pt has no barriers to participating in PR. She has no identifiable psychsocial issues. She scored a 4 on her PHQ-9 and this relates to her sleeping too much due to her lack of energy. She believes that this is due to her chronic lung condition and her atrial fibrillation. She reports that she took antidepressants about 15 years ago when her mother passed away, but she did not like the way that the medications made her feel, and that she would not consider taking these medications again. She is also participating in PT twice per week with the hopes that she will be able to strengthen her body. She reports that her support system includes her two nieces and her church family. Her goals while in the program are to decrease her SOB with ADL's, improve her energy levels, and to be able to walk longer distances. She specifically states that she would like to be able to take a 30 minute walk without stopping. She has a positive mindset and is eager to start the program. She previously participated in [place] in 2019, and she felt like it helped her a lot then.    Expected Outcomes Pt will continue to have no identifiable psychosocial issues.    Continue Psychosocial Services  No Follow up required             Psychosocial Re-Evaluation:  Psychosocial Re-Evaluation     [place] Name 09/08/21 1441 09/08/21 1455 09/12/21 0917 09/12/21 0930 10/08/21 1333     Psychosocial Re-Evaluation   Current issues with -- None Identified -- None Identified None Identified   Comments Patient is on session 2 and is tolerating the program well.  Patient has had one educational session on stress management.  We will continue to monitor. Patient is on session 1 and is tolerating the program well.  Patient has had one educational session on stress management.  We will continue to monitor. Patient is on session 4 and is tolerating the program well.  We will continue to monitor. Patient is on session 4 and is  tolerating the program well.  Patient is tolerating NuStep and Treadmill well.  Sats are 95-98% on RA, no resp distress noted.  VSS.  Patient has no identifiable issues.  We will continue to monitor. Patient is on session 10 and is tolerating the program well.  Patient has no identifiable issues.  We will continue to monitor.   Expected Outcomes Patient will have no psychosocial issues identified at discharge.  Patient will have no psychosocial issues identified at discharge.  -- Patient will have no psychosocial issues identified at discharge. Patient will have no psychosocial issues identified at discharge.   Interventions Relaxation education;Encouraged to attend Pulmonary Rehabilitation for the exercise;Stress management education Relaxation education;Encouraged to attend Pulmonary Rehabilitation for the exercise;Stress management education -- Relaxation education;Encouraged to attend Pulmonary Rehabilitation for the exercise;Stress management education Relaxation education;Encouraged to attend Pulmonary Rehabilitation for the exercise;Stress management education   Continue Psychosocial Services  No Follow up required No  Follow up required -- No Follow up required No Follow up required            Psychosocial Discharge (Final Psychosocial Re-Evaluation):  Psychosocial Re-Evaluation - 10/08/21 1333       Psychosocial Re-Evaluation   Current issues with None Identified    Comments Patient is on session 10 and is tolerating the program well.  Patient has no identifiable issues.  We will continue to monitor.    Expected Outcomes Patient will have no psychosocial issues identified at discharge.    Interventions Relaxation education;Encouraged to attend Pulmonary Rehabilitation for the exercise;Stress management education    Continue Psychosocial Services  No Follow up required              Education: Education Goals: Education classes will be provided on a weekly basis, covering required  topics. Participant will state understanding/return demonstration of topics presented.  Learning Barriers/Preferences:  Learning Barriers/Preferences - 09/02/21 0857       Learning Barriers/Preferences   Learning Barriers None    Learning Preferences Skilled Demonstration;Individual Instruction;Group Instruction             Education Topics: How Lungs Work and Diseases: - Discuss the anatomy of the lungs and diseases that can affect the lungs, such as COPD. Flowsheet Row PULMONARY REHAB OTHER RESPIRATORY from 10/02/2021 in [place]  Date 10/02/21  Educator Handout  Instruction Review Code 1- Verbalizes Understanding       Exercise: -Discuss the importance of exercise, FITT principles of exercise, normal and abnormal responses to exercise, and how to exercise safely.   Environmental Irritants: -Discuss types of environmental irritants and how to limit exposure to environmental irritants. Flowsheet Row PULMONARY REHAB OTHER RESPIRATORY from 03/10/2018 in [place]  Date 11/11/17  Educator DWynetta Emery  Instruction Review Code 2- Demonstrated Understanding       Meds/Inhalers and oxygen: - Discuss respiratory medications, definition of an inhaler and oxygen, and the proper way to use an inhaler and oxygen. Flowsheet Row PULMONARY REHAB OTHER RESPIRATORY from 03/10/2018 in [place]  Date 11/18/17  Educator D. Wynetta Emery       Energy Saving Techniques: - Discuss methods to conserve energy and decrease shortness of breath when performing activities of daily living.  Flowsheet Row PULMONARY REHAB OTHER RESPIRATORY from 03/10/2018 in [place]  Date 11/25/17  Educator Etheleen Mayhew  Instruction Review Code 2- Demonstrated Understanding       Bronchial Hygiene / Breathing Techniques: - Discuss breathing mechanics, pursed-lip breathing technique,  proper posture, effective ways  to clear airways, and other functional breathing techniques   Cleaning Equipment: - Provides group verbal and written instruction about the health risks of elevated stress, cause of high stress, and healthy ways to reduce stress. Flowsheet Row PULMONARY REHAB OTHER RESPIRATORY from 03/10/2018 in [place]  Date 03/10/18  Educator [place]  Instruction Review Code 2- Demonstrated Understanding       Nutrition I: Fats: - Discuss the types of cholesterol, what cholesterol does to the body, and how cholesterol levels can be controlled. Flowsheet Row PULMONARY REHAB OTHER RESPIRATORY from 03/10/2018 in [place]  Date 12/16/17  Educator Etheleen Mayhew  Instruction Review Code 2- Demonstrated Understanding       Nutrition II: Labels: -Discuss the different components of food labels and how to read food labels. Flowsheet Row PULMONARY REHAB OTHER RESPIRATORY from 03/10/2018 in [place]  Date 12/23/17  Educator Etheleen Mayhew  Instruction Review Code 2- Demonstrated Understanding       Respiratory Infections: - Discuss the signs and symptoms of respiratory infections, ways to prevent respiratory infections, and the importance of seeking medical treatment when having a respiratory infection. Flowsheet Row PULMONARY REHAB OTHER RESPIRATORY from 03/10/2018 in [place]  Date 01/06/18  Educator Etheleen Mayhew  Instruction Review Code 2- Demonstrated Understanding       Stress I: Signs and Symptoms: - Discuss the causes of stress, how stress may lead to anxiety and depression, and ways to limit stress. Flowsheet Row PULMONARY REHAB OTHER RESPIRATORY from 09/04/2021 in [place]  Date 09/03/21  Educator [place]  Instruction Review Code 1- Verbalizes Understanding       Stress II: Relaxation: -Discuss relaxation techniques to limit stress. Flowsheet Row PULMONARY REHAB OTHER  RESPIRATORY from 10/02/2021 in [place]  Date 09/11/21  Instruction Review Code 1- Verbalizes Understanding       Oxygen for Home/Travel: - Discuss how to prepare for travel when on oxygen and proper ways to transport and store oxygen to ensure safety. Flowsheet Row PULMONARY REHAB OTHER RESPIRATORY from 10/02/2021 in [place]  Date 09/18/21  Educator hj  Instruction Review Code 1- Verbalizes Understanding       Knowledge Questionnaire Score:  Knowledge Questionnaire Score - 09/02/21 0824       Knowledge Questionnaire Score   Pre Score 13/18             Core Components/Risk Factors/Patient Goals at Admission:  Personal Goals and Risk Factors at Admission - 09/02/21 0900       Core Components/Risk Factors/Patient Goals on Admission    Weight Management Yes;Obesity;Weight Loss    Intervention Weight Management: Develop a combined nutrition and exercise program designed to reach desired caloric intake, while maintaining appropriate intake of nutrient and fiber, sodium and fats, and appropriate energy expenditure required for the weight goal.;Weight Management: Provide education and appropriate resources to help participant work on and attain dietary goals.;Weight Management/Obesity: Establish reasonable short term and long term weight goals.;Obesity: Provide education and appropriate resources to help participant work on and attain dietary goals.    Expected Outcomes Short Term: Continue to assess and modify interventions until short term weight is achieved;Long Term: Adherence to nutrition and physical activity/exercise program aimed toward attainment of established weight goal;Weight Maintenance: Understanding of the daily nutrition guidelines, which includes 25-35% calories from fat, 7% or less cal from saturated fats, less than '200mg'$  cholesterol, less than 1.5gm of sodium, & 5 or more servings of fruits and vegetables daily;Weight  Loss: Understanding of general recommendations for a balanced deficit meal plan, which promotes 1-2 lb weight loss per week and includes a negative energy balance of [phone] kcal/d;Understanding recommendations for meals to include 15-35% energy as protein, 25-35% energy from fat, 35-60% energy from carbohydrates, less than '200mg'$  of dietary cholesterol, 20-35 gm of total fiber daily;Understanding of distribution of calorie intake throughout the day with the consumption of 4-5 meals/snacks    Improve shortness of breath with ADL's Yes    Intervention Provide education, individualized exercise plan and daily activity instruction to help decrease symptoms of SOB with activities of daily living.    Expected Outcomes Short Term: Improve cardiorespiratory fitness to achieve a reduction of symptoms when performing ADLs;Long Term: Be able to perform more ADLs without symptoms or delay the onset of symptoms    Personal Goal Other Yes  Personal Goal She wants to improve her energy level and to be able to walk longer distances.    Intervention Attend CR 3 x week and supplement with at home exercise 2 x week.     Expected Outcomes Achieve personal goals.              Core Components/Risk Factors/Patient Goals Review:   Goals and Risk Factor Review     Row Name 09/08/21 1445 09/12/21 0915 09/12/21 0937 10/08/21 1334       Core Components/Risk Factors/Patient Goals Review   Personal Goals Review -- -- -- Improve shortness of breath with ADL's;Develop more efficient breathing techniques such as purse lipped breathing and diaphragmatic breathing and practicing self-pacing with activity.;Increase knowledge of respiratory medications and ability to use respiratory devices properly.    Review Patient completed 1 session. She has completed this program in the passed and is doing well in this program. She has recently had chronic hip pain which limitions.  She  says the program helped her alot. She has more  strength and stamina now with increased energy.  She plans to continue exercising at the [facility] and by walking with some friends. We will continue to monitor. Patient completed 1 session. She has completed this program in the passed and is doing well in this program. She has recently had chronic hip pain which limitions.  She  says the program helped her alot. She has more strength and stamina now with increased energy.  She plans to continue exercising at the [facility] and by walking with some friends. We will continue to monitor. Patient completed 4 session. She has completed this program in the passed and is doing well in this program. She has recently had chronic hip pain which limitions.  She  says the program helped her alot. She has more strength and stamina now with increased energy.  She plans to continue exercising at the [facility] and by walking with some friends. We will continue to monitor. Patient completed 10 session.  Pt has chronic hip pain which limitations.  Pt will benefit from the  program. She will have more strength and stamina now with increased energy, which are her goals.    Expected Outcomes Patient will  complete the program and meet her personal goals. -- Patient will  complete the program and meet her personal goals. Patient will  complete the program and meet her personal goals.             Core Components/Risk Factors/Patient Goals at Discharge (Final Review):   Goals and Risk Factor Review - 10/08/21 1334       Core Components/Risk Factors/Patient Goals Review   Personal Goals Review Improve shortness of breath with ADL's;Develop more efficient breathing techniques such as purse lipped breathing and diaphragmatic breathing and practicing self-pacing with activity.;Increase knowledge of respiratory medications and ability to use respiratory devices properly.    Review Patient completed 10 session.  Pt has chronic hip pain which limitations.  Pt will benefit from the  program. She  will have more strength and stamina now with increased energy, which are her goals.    Expected Outcomes Patient will  complete the program and meet her personal goals.             ITP Comments:   Comments: ITP REVIEW Pt is making expected progress toward pulmonary rehab goals after completing 12 sessions. Recommend continued exercise, life style modification, education, and utilization of breathing techniques to increase stamina and  strength and decrease shortness of breath with exertion.

## 2021-10-16 ENCOUNTER — Encounter (HOSPITAL_COMMUNITY)
Admission: RE | Admit: 2021-10-16 | Discharge: 2021-10-16 | Disposition: A | Discharge status: Home / Self Care | Payer: Medicare Other | Source: Ambulatory Visit | Attending: Internal Medicine | Admitting: Internal Medicine

## 2021-10-16 DIAGNOSIS — J4540 Moderate persistent asthma, uncomplicated: Secondary | ICD-10-CM | POA: Diagnosis not present

## 2021-10-16 NOTE — Progress Notes (Signed)
Daily Session Note  Patient Details  Name: [name] [name] MRN: [id number] Date of Birth: [birth year] Referring Provider:   Flowsheet Row PULMONARY REHAB OTHER RESP ORIENTATION from 09/02/2021 in [place]  Referring Provider Dr. Annamaria Boots       Encounter Date: 10/16/2021  Check In:  Session Check In - 10/16/21 1045       Check-In   Supervising physician immediately available to respond to emergencies [facility] MD immediately available    Physician(s) Dr. Harl Bowie    Location [facility]    Staff Present Redge Gainer, BS, Exercise Physiologist;Dalton Kris Mouton, MS, ACSM-CEP, Exercise Physiologist;Atiana Levier Hassell Done, RN, BSN    Virtual Visit No    Medication changes reported     No    Fall or balance concerns reported    Yes    Comments She has not fallen but walks with a cane for support. 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    Tobacco Cessation No Change    Warm-up and Cool-down Performed as group-led instruction    Resistance Training Performed Yes    VAD Patient? No    PAD/SET Patient? No      Pain Assessment   Currently in Pain? No/denies    Pain Score 0-No pain    Multiple Pain Sites No             Capillary Blood Glucose: No results found for this or any previous visit (from the past 24 hour(s)).    Social History   Tobacco Use  Smoking Status Never  Smokeless Tobacco Never    Goals Met:  Proper associated with RPD/PD & O2 Sat Independence with exercise equipment Using PLB without cueing & demonstrates good technique Exercise tolerated well Queuing for purse lip breathing No report of concerns or symptoms today Strength training completed today  Goals Unmet:  Not Applicable  Comments: checkout at 1145.   Dr. Kathie Dike is Medical Director for [facility] Pulmonary Rehab.

## 2021-10-21 ENCOUNTER — Encounter (HOSPITAL_COMMUNITY)
Admission: RE | Admit: 2021-10-21 | Discharge: 2021-10-21 | Disposition: A | Discharge status: Home / Self Care | Payer: Medicare Other | Source: Ambulatory Visit | Attending: Internal Medicine | Admitting: Internal Medicine

## 2021-10-21 DIAGNOSIS — J4540 Moderate persistent asthma, uncomplicated: Secondary | ICD-10-CM | POA: Diagnosis not present

## 2021-10-21 NOTE — Progress Notes (Signed)
Daily Session Note  Patient Details  Name: [name] [name] MRN: [id number] Date of Birth: 1940/12/01 Referring Provider:   Flowsheet Row PULMONARY REHAB OTHER RESP ORIENTATION from 09/02/2021 in [place]  Referring Provider Dr. Annamaria Boots       Encounter Date: 10/21/2021  Check In:  Session Check In - 10/21/21 1041       Check-In   Supervising physician immediately available to respond to emergencies [facility] MD immediately available    Physician(s) Dr Debara Pickett    Location [facility]    Staff Present Hoy Register, MS, ACSM-CEP, Exercise Physiologist;Hazel Leveille Hassell Done, RN, BSN;Phyllis Billingsley, RN    Virtual Visit No    Medication changes reported     No    Fall or balance concerns reported    Yes    Comments She has not fallen but walks with a cane for support. 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    Tobacco Cessation No Change    Warm-up and Cool-down Performed as group-led instruction    Resistance Training Performed Yes    VAD Patient? No    PAD/SET Patient? No      Pain Assessment   Currently in Pain? No/denies    Pain Score 0-No pain    Multiple Pain Sites No             Capillary Blood Glucose: No results found for this or any previous visit (from the past 24 hour(s)).    Social History   Tobacco Use  Smoking Status Never  Smokeless Tobacco Never    Goals Met:  Proper associated with RPD/PD & O2 Sat Independence with exercise equipment Using PLB without cueing & demonstrates good technique Exercise tolerated well Queuing for purse lip breathing No report of concerns or symptoms today Strength training completed today  Goals Unmet:  Not Applicable  Comments: Checkout at 1145.   Dr. Kathie Dike is Medical Director for [facility] Pulmonary Rehab.

## 2021-10-22 ENCOUNTER — Ambulatory Visit (INDEPENDENT_AMBULATORY_CARE_PROVIDER_SITE_OTHER): Payer: Medicare Other

## 2021-10-22 VITALS — BP 138/60 | HR 59 | Temp 97.5°F | Resp 18 | Ht 61.0 in | Wt 198.0 lb

## 2021-10-22 DIAGNOSIS — J4520 Mild intermittent asthma, uncomplicated: Secondary | ICD-10-CM | POA: Diagnosis not present

## 2021-10-22 MED ORDER — MEPOLIZUMAB 100 MG ~~LOC~~ SOLR
100.0000 mg | Freq: Once | SUBCUTANEOUS | Status: AC
  Administered 2021-10-22: 100 mg via SUBCUTANEOUS
  Filled 2021-10-22: qty 1

## 2021-10-22 NOTE — Progress Notes (Signed)
Diagnosis: Asthma ? ?Provider:  Praveen Mannam, MD ? ?Procedure: Injection ? ?Nucala (Mepolizumab), Dose: 100 mg, Site: subcutaneous, Number of injections: 1 ? ?Discharge: Condition: Good, Destination: Home . AVS provided to patient.  ? ?Performed by:  Theador Jezewski, RN  ? ? ? ?  ?

## 2021-10-23 ENCOUNTER — Encounter (HOSPITAL_COMMUNITY)
Admission: RE | Admit: 2021-10-23 | Discharge: 2021-10-23 | Disposition: A | Discharge status: Home / Self Care | Payer: Medicare Other | Source: Ambulatory Visit | Attending: Internal Medicine | Admitting: Internal Medicine

## 2021-10-23 DIAGNOSIS — J4540 Moderate persistent asthma, uncomplicated: Secondary | ICD-10-CM | POA: Diagnosis not present

## 2021-10-23 NOTE — Progress Notes (Signed)
Carelink Summary Report / Loop Recorder 

## 2021-10-23 NOTE — Progress Notes (Signed)
Daily Session Note  Patient Details  Name: [name] [name] MRN: [id number] Date of Birth: January 22, 1941 Referring Provider:   Flowsheet Row PULMONARY REHAB OTHER RESP ORIENTATION from 09/02/2021 in [place]  Referring Provider Dr. Annamaria Boots       Encounter Date: 10/23/2021  Check In:  Session Check In - 10/23/21 1041       Check-In   Supervising physician immediately available to respond to emergencies [facility] MD immediately available    Physician(s) Dr Gardiner Rhyme    Location [facility]    Staff Present Hoy Register, MS, ACSM-CEP, Exercise Physiologist;Vonna Brabson Hassell Done, RN, BSN;Christy Edwards, RN, BSN    Virtual Visit No    Medication changes reported     No    Fall or balance concerns reported    Yes    Comments She has not fallen but walks with a cane for support. 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    Tobacco Cessation No Change    Warm-up and Cool-down Performed as group-led instruction    Resistance Training Performed Yes    VAD Patient? No    PAD/SET Patient? No      Pain Assessment   Currently in Pain? No/denies    Pain Score 0-No pain    Multiple Pain Sites No             Capillary Blood Glucose: No results found for this or any previous visit (from the past 24 hour(s)).    Social History   Tobacco Use  Smoking Status Never  Smokeless Tobacco Never    Goals Met:  Proper associated with RPD/PD & O2 Sat Independence with exercise equipment Using PLB without cueing & demonstrates good technique Exercise tolerated well Queuing for purse lip breathing No report of concerns or symptoms today Strength training completed today  Goals Unmet:  Not Applicable  Comments: checkout at 1145.   Dr. Kathie Dike is Medical Director for [facility].

## 2021-10-27 DIAGNOSIS — I10 Essential (primary) hypertension: Secondary | ICD-10-CM | POA: Diagnosis not present

## 2021-10-28 ENCOUNTER — Encounter (HOSPITAL_COMMUNITY)
Admission: RE | Admit: 2021-10-28 | Discharge: 2021-10-28 | Disposition: A | Discharge status: Home / Self Care | Payer: Medicare Other | Source: Ambulatory Visit | Attending: Internal Medicine | Admitting: Internal Medicine

## 2021-10-28 VITALS — Wt 200.8 lb

## 2021-10-28 DIAGNOSIS — J4540 Moderate persistent asthma, uncomplicated: Secondary | ICD-10-CM | POA: Insufficient documentation

## 2021-10-28 NOTE — Progress Notes (Signed)
Daily Session Note  Patient Details  Name: [name] [name] MRN: [id number] Date of Birth: 1940-11-27 Referring Provider:   Flowsheet Row PULMONARY REHAB OTHER RESP ORIENTATION from 09/02/2021 in [place]  Referring Provider Dr. Annamaria Boots       Encounter Date: 10/28/2021  Check In:  Session Check In - 10/28/21 1041       Check-In   Supervising physician immediately available to respond to emergencies [facility] immediately available    Physician(s) Dr Gardiner Rhyme    Location [facility]    Staff Present Hoy Register, MS, ACSM-CEP, Exercise Physiologist;Heather Zigmund Daniel, Exercise Physiologist    Virtual Visit No    Medication changes reported     No    Fall or balance concerns reported    Yes    Comments She has not fallen but walks with a cane for support. 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    Tobacco Cessation No Change    Warm-up and Cool-down Performed as group-led instruction    Resistance Training Performed Yes    VAD Patient? No    PAD/SET Patient? No      Pain Assessment   Currently in Pain? No/denies    Pain Score 0-No pain    Multiple Pain Sites No             Capillary Blood Glucose: No results found for this or any previous visit (from the past 24 hour(s)).    Social History   Tobacco Use  Smoking Status Never  Smokeless Tobacco Never    Goals Met:  Independence with exercise equipment Exercise tolerated well No report of concerns or symptoms today Strength training completed today  Goals Unmet:  Not Applicable  Comments: checkout time is 1145   Dr. Carlyle Dolly is Medical Director for [place]

## 2021-10-30 ENCOUNTER — Encounter (HOSPITAL_COMMUNITY)
Admission: RE | Admit: 2021-10-30 | Discharge: 2021-10-30 | Disposition: A | Discharge status: Home / Self Care | Payer: Medicare Other | Source: Ambulatory Visit | Attending: Internal Medicine | Admitting: Internal Medicine

## 2021-10-30 DIAGNOSIS — J4540 Moderate persistent asthma, uncomplicated: Secondary | ICD-10-CM

## 2021-10-30 NOTE — Progress Notes (Signed)
Daily Session Note  Patient Details  Name: [name] [name] MRN: [id number] Date of Birth: 08/13/1940 Referring Provider:   Flowsheet Row PULMONARY REHAB OTHER RESP ORIENTATION from 09/02/2021 in [place] [facility]  Referring Provider Dr. Young       Encounter Date: 10/30/2021  Check In:  Session Check In - 10/30/21 1045       Check-In   Supervising physician immediately available to respond to emergencies [facility] immediately available    Physician(s) Dr. Pemberton    Location [facility]    Staff Present Phyllis Billingsley, RN;Daphyne Martin, RN, BSN;Dalton Fletcher, MS, ACSM-CEP, Exercise Physiologist    Virtual Visit No    Medication changes reported     No    Fall or balance concerns reported    Yes    Comments She has not fallen but walks with a cane for support. 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    Tobacco Cessation No Change    Warm-up and Cool-down Performed as group-led instruction    Resistance Training Performed Yes    VAD Patient? No    PAD/SET Patient? No      Pain Assessment   Currently in Pain? No/denies    Pain Score 0-No pain    Multiple Pain Sites No             Capillary Blood Glucose: No results found for this or any previous visit (from the past 24 hour(s)).    Social History   Tobacco Use  Smoking Status Never  Smokeless Tobacco Never    Goals Met:  Proper associated with RPD/PD & O2 Sat Independence with exercise equipment Exercise tolerated well No report of concerns or symptoms today Strength training completed today  Goals Unmet:  Not Applicable  Comments: checkout @ 11:45am   Dr. Jehanzeb Memon is Medical Director for [place] [facility]. 

## 2021-10-31 DIAGNOSIS — M542 Cervicalgia: Secondary | ICD-10-CM | POA: Diagnosis not present

## 2021-10-31 DIAGNOSIS — M9901 Segmental and somatic dysfunction of cervical region: Secondary | ICD-10-CM | POA: Diagnosis not present

## 2021-10-31 DIAGNOSIS — M9903 Segmental and somatic dysfunction of lumbar region: Secondary | ICD-10-CM | POA: Diagnosis not present

## 2021-10-31 DIAGNOSIS — M79672 Pain in left foot: Secondary | ICD-10-CM | POA: Diagnosis not present

## 2021-10-31 DIAGNOSIS — M9902 Segmental and somatic dysfunction of thoracic region: Secondary | ICD-10-CM | POA: Diagnosis not present

## 2021-10-31 DIAGNOSIS — M79671 Pain in right foot: Secondary | ICD-10-CM | POA: Diagnosis not present

## 2021-10-31 DIAGNOSIS — M48061 Spinal stenosis, lumbar region without neurogenic claudication: Secondary | ICD-10-CM | POA: Diagnosis not present

## 2021-10-31 DIAGNOSIS — M9905 Segmental and somatic dysfunction of pelvic region: Secondary | ICD-10-CM | POA: Diagnosis not present

## 2021-10-31 DIAGNOSIS — M546 Pain in thoracic spine: Secondary | ICD-10-CM | POA: Diagnosis not present

## 2021-11-03 ENCOUNTER — Ambulatory Visit (INDEPENDENT_AMBULATORY_CARE_PROVIDER_SITE_OTHER): Payer: Medicare Other

## 2021-11-03 DIAGNOSIS — I495 Sick sinus syndrome: Secondary | ICD-10-CM | POA: Diagnosis not present

## 2021-11-03 LAB — CUP PACEART REMOTE DEVICE CHECK
Date Time Interrogation Session: 20230804231837
Implantable Pulse Generator Implant Date: 20220826

## 2021-11-04 ENCOUNTER — Encounter (HOSPITAL_COMMUNITY)
Admission: RE | Admit: 2021-11-04 | Discharge: 2021-11-04 | Disposition: A | Discharge status: Home / Self Care | Payer: Medicare Other | Source: Ambulatory Visit | Attending: Internal Medicine | Admitting: Internal Medicine

## 2021-11-04 DIAGNOSIS — J4540 Moderate persistent asthma, uncomplicated: Secondary | ICD-10-CM

## 2021-11-04 NOTE — Progress Notes (Signed)
Daily Session Note  Patient Details  Name: [name] [name] MRN: [id number] Date of Birth: [birth year] Referring Provider:   Flowsheet Row PULMONARY REHAB OTHER RESP ORIENTATION from 09/02/2021 in [place]  Referring Provider Dr. Annamaria Boots       Encounter Date: 11/04/2021  Check In:  Session Check In - 11/04/21 1045       Check-In   Supervising physician immediately available to respond to emergencies [facility] immediately available    Physician(s) Dr Phineas Inches    Location [facility]    Staff Present Geanie Cooley, RN;Nevae Pinnix Hassell Done, RN, BSN;Heather Otho Ket, BS, Exercise Physiologist    Virtual Visit No    Medication changes reported     No    Fall or balance concerns reported    Yes    Comments She has not fallen but walks with a cane for support. 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    Tobacco Cessation No Change    Warm-up and Cool-down Performed as group-led instruction    Resistance Training Performed Yes    VAD Patient? No    PAD/SET Patient? No      Pain Assessment   Currently in Pain? No/denies    Pain Score 0-No pain    Multiple Pain Sites No             Capillary Blood Glucose: No results found for this or any previous visit (from the past 24 hour(s)).    Social History   Tobacco Use  Smoking Status Never  Smokeless Tobacco Never    Goals Met:  Proper associated with RPD/PD & O2 Sat Independence with exercise equipment Using PLB without cueing & demonstrates good technique Exercise tolerated well Queuing for purse lip breathing No report of concerns or symptoms today Strength training completed today  Goals Unmet:  Not Applicable  Comments: Checkout at 1145.   Dr. Kathie Dike is Medical Director for [facility].

## 2021-11-06 ENCOUNTER — Encounter (HOSPITAL_COMMUNITY)
Admission: RE | Admit: 2021-11-06 | Discharge: 2021-11-06 | Disposition: A | Discharge status: Home / Self Care | Payer: Medicare Other | Source: Ambulatory Visit | Attending: Internal Medicine | Admitting: Internal Medicine

## 2021-11-06 DIAGNOSIS — J4540 Moderate persistent asthma, uncomplicated: Secondary | ICD-10-CM | POA: Diagnosis not present

## 2021-11-06 NOTE — Progress Notes (Signed)
Daily Session Note  Patient Details  Name: [name] [name] MRN: [id number] Date of Birth: [birth year] Referring Provider:   Flowsheet Row PULMONARY REHAB OTHER RESP ORIENTATION from 09/02/2021 in [place]  Referring Provider Dr. Annamaria Boots       Encounter Date: 11/06/2021  Check In:  Session Check In - 11/06/21 1041       Check-In   Supervising physician immediately available to respond to emergencies [facility] immediately available    Physician(s) Dr. Zandra Abts    Location [facility]    Staff Present Hoy Register, MS, ACSM-CEP, Exercise Physiologist;Heather Zigmund Daniel, Exercise Physiologist    Virtual Visit No    Medication changes reported     No    Fall or balance concerns reported    Yes    Comments She has not fallen but walks with a cane for support. 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    Tobacco Cessation No Change    Warm-up and Cool-down Performed as group-led instruction    Resistance Training Performed Yes    VAD Patient? No    PAD/SET Patient? No      Pain Assessment   Currently in Pain? No/denies    Pain Score 0-No pain    Multiple Pain Sites No             Capillary Blood Glucose: No results found for this or any previous visit (from the past 24 hour(s)).    Social History   Tobacco Use  Smoking Status Never  Smokeless Tobacco Never    Goals Met:  Proper associated with RPD/PD & O2 Sat Independence with exercise equipment Using PLB without cueing & demonstrates good technique Exercise tolerated well Queuing for purse lip breathing No report of concerns or symptoms today Strength training completed today  Goals Unmet:  Not Applicable  Comments: checkout time is 1145   Dr. Kathie Dike is Medical Director for [facility].

## 2021-11-11 ENCOUNTER — Ambulatory Visit (INDEPENDENT_AMBULATORY_CARE_PROVIDER_SITE_OTHER): Payer: Medicare Other

## 2021-11-11 ENCOUNTER — Encounter (HOSPITAL_COMMUNITY)
Admission: RE | Admit: 2021-11-11 | Discharge: 2021-11-11 | Disposition: A | Discharge status: Home / Self Care | Payer: Medicare Other | Source: Ambulatory Visit | Attending: Internal Medicine | Admitting: Internal Medicine

## 2021-11-11 ENCOUNTER — Ambulatory Visit (INDEPENDENT_AMBULATORY_CARE_PROVIDER_SITE_OTHER): Payer: Medicare Other | Admitting: Podiatry

## 2021-11-11 VITALS — Wt 198.4 lb

## 2021-11-11 DIAGNOSIS — B351 Tinea unguium: Secondary | ICD-10-CM

## 2021-11-11 DIAGNOSIS — M21612 Bunion of left foot: Secondary | ICD-10-CM | POA: Diagnosis not present

## 2021-11-11 DIAGNOSIS — M79674 Pain in right toe(s): Secondary | ICD-10-CM | POA: Diagnosis not present

## 2021-11-11 DIAGNOSIS — J4540 Moderate persistent asthma, uncomplicated: Secondary | ICD-10-CM

## 2021-11-11 DIAGNOSIS — M79675 Pain in left toe(s): Secondary | ICD-10-CM

## 2021-11-11 DIAGNOSIS — M21611 Bunion of right foot: Secondary | ICD-10-CM | POA: Diagnosis not present

## 2021-11-11 DIAGNOSIS — M21619 Bunion of unspecified foot: Secondary | ICD-10-CM | POA: Diagnosis not present

## 2021-11-11 DIAGNOSIS — M722 Plantar fascial fibromatosis: Secondary | ICD-10-CM

## 2021-11-11 DIAGNOSIS — R931 Abnormal findings on diagnostic imaging of heart and coronary circulation: Secondary | ICD-10-CM | POA: Diagnosis not present

## 2021-11-11 DIAGNOSIS — M19071 Primary osteoarthritis, right ankle and foot: Secondary | ICD-10-CM

## 2021-11-11 NOTE — Patient Instructions (Signed)
You can also use [facility]OLTAREN GEL

## 2021-11-11 NOTE — Progress Notes (Signed)
Daily Session Note  Patient Details  Name: [name] [name] MRN: [id number] Date of Birth: [birth year] Referring Provider:   Flowsheet Row PULMONARY REHAB OTHER RESP ORIENTATION from 09/02/2021 in [place]  Referring Provider Dr. Annamaria Boots       Encounter Date: 11/11/2021  Check In:  Session Check In - 11/11/21 1043       Check-In   Supervising physician immediately available to respond to emergencies [facility] immediately available    Physician(s) Dr. Domenic Polite    Location [facility]    Staff Present Hoy Register, MS, ACSM-CEP, Exercise Physiologist;Heather Zigmund Daniel, Exercise Physiologist    Virtual Visit No    Medication changes reported     No    Fall or balance concerns reported    Yes    Comments She has not fallen but walks with a cane for support. 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    Tobacco Cessation No Change    Warm-up and Cool-down Performed as group-led instruction    Resistance Training Performed Yes    VAD Patient? No    PAD/SET Patient? No      Pain Assessment   Currently in Pain? No/denies    Pain Score 0-No pain    Multiple Pain Sites No             Capillary Blood Glucose: No results found for this or any previous visit (from the past 24 hour(s)).    Social History   Tobacco Use  Smoking Status Never  Smokeless Tobacco Never    Goals Met:  Proper associated with RPD/PD & O2 Sat Independence with exercise equipment Using PLB without cueing & demonstrates good technique Exercise tolerated well Queuing for purse lip breathing No report of concerns or symptoms today Strength training completed today  Goals Unmet:  Not Applicable  Comments: checkout time is 1145   Dr. Kathie Dike is Medical Director for [facility].

## 2021-11-11 NOTE — Progress Notes (Signed)
Subjective: Chief Complaint  Patient presents with   Foot Problem    bil foot pain/ has started 2 physical therapy groups/ bunion on right foot has gotten worse/ discuss new orthotics, request callous removal and toenail clip     81 year old female presents with above complaints.  She said that she recently started 2 different physical therapy groups and is now on a treadmill to help with respiratory therapy.  She is also doing physical therapy and is doing heel raises, toe raises.  She said that her feet on her back while walking is more with sitting.  She describes arch pain.  Also has pain in the bunion along the fifth toe.  She previously had callus removal and toenail trim the pedicure place patient has some discoloration of the nail afterwards.  No drainage or pus or swelling or redness.  No other concerns.  Objective: AAO x3, NAD DP/PT pulses palpable bilaterally, CRT less than 3 seconds There is tenderness on the left foot digit toenails bilaterally.  There are some dried blood present in the toenails.  There is no edema, erythema or signs of infection.  No open lesions. Tenderness to palpation along the plantar aspect of foot and insertion of the plantar fascia at the plantar calcaneus as well as the arch of the foot.  There is no specific area pinpoint tenderness.  There is no increased edema and there is no erythema.,  Extensor tendons appear to be intact.  Bunion present right foot with decreased range of motion of first MPJ.  Previous arthrodesis left first MPJ.  MMT 5/5.  No pain with calf compression, swelling, warmth, erythema  Assessment: Plantar fasciitis, foot pain, onychomycosis  Plan: -All treatment options discussed with the patient including all alternatives, risks, complications.  -X-rays obtained reviewed.  3 views bilateral feet were obtained.  Previous first MTPJ arthrodesis left foot.  Decreased calcaneal angulation ankle.  Bunion, arthritic changes first MPJ right  foot. -I think she is doing a good job with the stretching, rehab exercises she is doing but encouraged doing this at home as well daily.  Discussed topical Voltaren gel as well as icing at home.  Discussed shoes and good arch support. -As a courtesy debride the nail without any complications or bleeding. -Patient encouraged to call the office with any questions, concerns, change in symptoms.  [name]

## 2021-11-12 NOTE — Progress Notes (Signed)
Pulmonary Individual Treatment Plan  Patient Details  Name: [name] [name] MRN: [id number] Date of Birth: 05-15-1940 Referring Provider:   Flowsheet Row PULMONARY REHAB OTHER RESP ORIENTATION from 09/02/2021 in [facility]  Referring Provider Dr. Annamaria Boots       Initial Encounter Date:  Flowsheet Row PULMONARY REHAB OTHER RESP ORIENTATION from 09/02/2021 in [facility]  Date 09/02/21       Visit Diagnosis: Moderate persistent asthma, unspecified whether complicated  Patient's Home Medications on Admission:   Current Outpatient Medications:    acetaminophen (TYLENOL) 500 MG tablet, Take 500 mg by mouth every 8 (eight) hours as needed for headache., Disp: , Rfl:    antiseptic oral rinse (BIOTENE) LIQD, 15 mLs by Mouth Rinse route daily as needed for dry mouth., Disp: , Rfl:    atorvastatin (LIPITOR) 10 MG tablet, Take 10 mg by mouth daily at 6 PM., Disp: , Rfl:    benzonatate (TESSALON) 200 MG capsule, Take 1 capsule (200 mg total) by mouth 3 (three) times daily as needed for cough., Disp: 30 capsule, Rfl: 3   Biotin 1 MG CAPS, Take 1,000 mg by mouth 3 (three) times a week. , Disp: , Rfl:    budesonide (PULMICORT) 0.5 MG/2ML nebulizer solution, Take 0.5 mg by nebulization daily. Additional if needed during the day, Disp: , Rfl:    Calcium Carbonate-Vit D-Min (QC CALCIUM-MAGNESIUM-ZINC-D3) 333.4-133 MG-UNIT TABS, Take 1 tablet by mouth 2 (two) times daily. , Disp: , Rfl:    cetirizine (ZYRTEC) 10 MG tablet, Take 10 mg by mouth at bedtime as needed for allergies., Disp: , Rfl:    clindamycin (CLEOCIN) 300 MG capsule, Take 600 mg by mouth See admin instructions. Take prior to dental procedures, Disp: , Rfl: 0   diltiazem (CARDIZEM CD) 120 MG 24 hr capsule, Take 1 capsule (120 mg total) by mouth every evening., Disp: , Rfl:    diltiazem (CARDIZEM CD) 240 MG 24 hr capsule, Take 1 capsule (240 mg total) by mouth every morning., Disp: 90 capsule, Rfl:  1   diltiazem (CARDIZEM) 30 MG tablet, TAKE 1 TABLET BY MOUTH AS NEEDED FOR PALPITATIONS, Disp: 45 tablet, Rfl: 2   dofetilide (TIKOSYN) 500 MCG capsule, TAKE ONE CAPSULE BY MOUTH TWICE A DAY, Disp: 180 capsule, Rfl: 1   EPINEPHrine 0.3 mg/0.3 mL IJ SOAJ injection, Inject 0.3 mg into the muscle as needed for anaphylaxis., Disp: , Rfl:    famotidine (PEPCID) 20 MG tablet, Take 2 tablets (40 mg total) by mouth at bedtime as needed., Disp: 30 tablet, Rfl: 1   fluticasone (FLONASE) 50 MCG/ACT nasal spray, Place 1 spray into both nostrils 2 (two) times daily., Disp: , Rfl:    hydrALAZINE (APRESOLINE) 100 MG tablet, Take 100 mg by mouth 2 (two) times daily., Disp: , Rfl:    HYDROcodone bit-homatropine (HYCODAN) 5-1.5 MG/5ML syrup, Take 5 mLs by mouth every 6 (six) hours as needed for cough., Disp: , Rfl:    Hypertonic Nasal Wash (SINUS RINSE) PACK, Place 1 each into the nose daily as needed (clear nasal passage). Neilmed sinus rinse, Disp: , Rfl:    ipratropium-albuterol (DUONEB) 0.5-2.5 (3) MG/3ML SOLN, Take 3 mLs by nebulization 4 (four) times daily as needed (Shortness of breathing)., Disp: , Rfl:    losartan (COZAAR) 100 MG tablet, TAKE 1 TABLET BY MOUTH DAILY, Disp: 90 tablet, Rfl: 3   Magnesium 250 MG TABS, Take 250 mg by mouth daily., Disp: , Rfl:    montelukast (SINGULAIR)  10 MG tablet, Take 10 mg by mouth at bedtime., Disp: , Rfl:    omeprazole (PRILOSEC) 40 MG capsule, Take 1 capsule (40 mg total) by mouth daily., Disp: 60 capsule, Rfl: 2   Polyethyl Glycol-Propyl Glycol (SYSTANE) 0.4-0.3 % GEL ophthalmic gel, Place 1 application. into both eyes daily as needed (Dry eye)., Disp: , Rfl:    Polyethyl Glycol-Propyl Glycol (SYSTANE) 0.4-0.3 % SOLN, Place 1 drop into both eyes daily as needed (Dry eye)., Disp: , Rfl:    potassium chloride SA (KLOR-CON M) 20 MEQ tablet, TAKE 1 TABLET BY MOUTH DAILY, Disp: 90 tablet, Rfl: 3   umeclidinium-vilanterol (ANORO ELLIPTA) 62.5-25 MCG/ACT AEPB, INHALE ONE PUFF  INTO THE LUNGS DAILY (Patient taking differently: 1 puff every morning.), Disp: 60 each, Rfl: 11   Wheat Dextrin (BENEFIBER ON THE GO) POWD, Take 10 mLs by mouth daily., Disp: , Rfl:    XARELTO 20 MG TABS tablet, TAKE 1 TABLET BY MOUTH DAILY with SUPPER, Disp: 90 tablet, Rfl: 1  Past Medical History: Past Medical History:  Diagnosis Date   Allergic rhinitis    Anxiety    Asthma    Atrial flutter (HCC)    COPD (chronic obstructive pulmonary disease) (HCC)    Depression    Essential hypertension    Hyperlipidemia    Lumbar disc disease    Obesity    OSA (obstructive sleep apnea)    CPAP   Paroxysmal atrial fibrillation (HCC)    Element of tachycardia bradycardia syndrome   Respiratory failure (HCC)     Tobacco Use: Social History   Tobacco Use  Smoking Status Never  Smokeless Tobacco Never    Labs: Review Flowsheet       Latest Ref Rng & Units 11/26/2020  Labs for ITP Cardiac and Pulmonary Rehab  TCO2 22 - 32 mmol/L 23     Capillary Blood Glucose: No results found for: "GLUCAP"   Pulmonary Assessment Scores:  Pulmonary Assessment Scores     Row Name 09/02/21 0824         ADL UCSD   ADL Phase Entry     SOB Score total 82     Rest 1     Walk 3     Stairs 5     Bath 4     Dress 3     Shop 3       CAT Score   CAT Score 15       mMRC Score   mMRC Score 3             UCSD: Self-administered rating of dyspnea associated with activities of daily living (ADLs) 6-point scale (0 = "not at all" to 5 = "maximal or unable to do because of breathlessness")  Scoring Scores range from 0 to 120.  Minimally important difference is 5 units  CAT: CAT can identify the health impairment of COPD patients and is better correlated with disease progression.  CAT has a scoring range of zero to 40. The CAT score is classified into four groups of low (less than 10), medium (10 - 20), high (21-30) and very high (31-40) based on the impact level of disease on health  status. A CAT score over 10 suggests significant symptoms.  A worsening CAT score could be explained by an exacerbation, poor medication adherence, poor inhaler technique, or progression of COPD or comorbid conditions.  CAT MCID is 2 points  mMRC: mMRC (Modified Medical Research Council) Dyspnea Scale is used to assess  the degree of baseline functional disability in patients of respiratory disease due to dyspnea. No minimal important difference is established. A decrease in score of 1 point or greater is considered a positive change.   Pulmonary Function Assessment:   Exercise Target Goals: Exercise Program Goal: Individual exercise prescription set using results from initial 6 min walk test and THRR while considering  patient's activity barriers and safety.   Exercise Prescription Goal: Initial exercise prescription builds to 30-45 minutes a day of aerobic activity, 2-3 days per week.  Home exercise guidelines will be given to patient during program as part of exercise prescription that the participant will acknowledge.  Activity Barriers & Risk Stratification:  Activity Barriers & Cardiac Risk Stratification - 09/02/21 0852       Activity Barriers & Cardiac Risk Stratification   Activity Barriers Right Hip Replacement;Left Hip Replacement;Arthritis;Back Problems;Joint Problems;Deconditioning;Shortness of Breath;Balance Concerns;Other (comment)    Comments hammer toes, atrial fibrillation    Cardiac Risk Stratification Moderate             6 Minute Walk:  6 Minute Walk     Row Name 09/02/21 0851         6 Minute Walk   Phase Initial     Distance 750 feet     Walk Time 6 minutes     # of Rest Breaks 0     MPH 1.42     METS 1.22     RPE 13     Perceived Dyspnea  15     VO2 Peak 4.29     Symptoms No     Resting HR 59 bpm     Resting BP 148/62     Resting Oxygen Saturation  98 %     Exercise Oxygen Saturation  during 6 min walk 96 %     Max Ex. HR 120 bpm     Max  Ex. BP 158/60     2 Minute Post BP 120/56       Interval HR   1 Minute HR 110     2 Minute HR 109     3 Minute HR 119     4 Minute HR 116     5 Minute HR 116     6 Minute HR 120     2 Minute Post HR 68     Interval Heart Rate? Yes       Interval Oxygen   Interval Oxygen? Yes     Baseline Oxygen Saturation % 98 %     1 Minute Oxygen Saturation % 97 %     1 Minute Liters of Oxygen 0 L     2 Minute Oxygen Saturation % 97 %     2 Minute Liters of Oxygen 0 L     3 Minute Oxygen Saturation % 96 %     3 Minute Liters of Oxygen 0 L     4 Minute Oxygen Saturation % 97 %     4 Minute Liters of Oxygen 0 L     5 Minute Oxygen Saturation % 97 %     5 Minute Liters of Oxygen 0 L     6 Minute Oxygen Saturation % 97 %     6 Minute Liters of Oxygen 0 L     2 Minute Post Oxygen Saturation % 98 %     2 Minute Post Liters of Oxygen 0 L  Oxygen Initial Assessment:  Oxygen Initial Assessment - 09/02/21 0921       Initial 6 min Walk   Oxygen Used None      Program Oxygen Prescription   Program Oxygen Prescription None      Intervention   Short Term Goals To learn and understand importance of maintaining oxygen saturations>88%;To learn and understand importance of monitoring SPO2 with pulse oximeter and demonstrate accurate use of the pulse oximeter.;To learn and demonstrate proper pursed lip breathing techniques or other breathing techniques.     Long  Term Goals Maintenance of O2 saturations>88%;Verbalizes importance of monitoring SPO2 with pulse oximeter and return demonstration;Exhibits proper breathing techniques, such as pursed lip breathing or other method taught during program session             Oxygen Re-Evaluation:  Oxygen Re-Evaluation     Row Name 09/16/21 1249 10/14/21 1307           Program Oxygen Prescription   Program Oxygen Prescription None None        Home Oxygen   Home Oxygen Device None None      Sleep Oxygen Prescription CPAP CPAP       Liters per minute 0 0      Home Exercise Oxygen Prescription None None      Home Resting Oxygen Prescription None None      Compliance with Home Oxygen Use Yes Yes        Goals/Expected Outcomes   Short Term Goals To learn and understand importance of maintaining oxygen saturations>88%;To learn and understand importance of monitoring SPO2 with pulse oximeter and demonstrate accurate use of the pulse oximeter.;To learn and demonstrate proper pursed lip breathing techniques or other breathing techniques.  To learn and understand importance of maintaining oxygen saturations>88%;To learn and understand importance of monitoring SPO2 with pulse oximeter and demonstrate accurate use of the pulse oximeter.;To learn and demonstrate proper pursed lip breathing techniques or other breathing techniques.       Long  Term Goals Maintenance of O2 saturations>88%;Verbalizes importance of monitoring SPO2 with pulse oximeter and return demonstration;Exhibits proper breathing techniques, such as pursed lip breathing or other method taught during program session Maintenance of O2 saturations>88%;Verbalizes importance of monitoring SPO2 with pulse oximeter and return demonstration;Exhibits proper breathing techniques, such as pursed lip breathing or other method taught during program session      Goals/Expected Outcomes complinace complinace               Oxygen Discharge (Final Oxygen Re-Evaluation):  Oxygen Re-Evaluation - 10/14/21 1307       Program Oxygen Prescription   Program Oxygen Prescription None      Home Oxygen   Home Oxygen Device None    Sleep Oxygen Prescription CPAP    Liters per minute 0    Home Exercise Oxygen Prescription None    Home Resting Oxygen Prescription None    Compliance with Home Oxygen Use Yes      Goals/Expected Outcomes   Short Term Goals To learn and understand importance of maintaining oxygen saturations>88%;To learn and understand importance of monitoring SPO2 with  pulse oximeter and demonstrate accurate use of the pulse oximeter.;To learn and demonstrate proper pursed lip breathing techniques or other breathing techniques.     Long  Term Goals Maintenance of O2 saturations>88%;Verbalizes importance of monitoring SPO2 with pulse oximeter and return demonstration;Exhibits proper breathing techniques, such as pursed lip breathing or other method taught during program session    Goals/Expected Outcomes  complinace             Initial Exercise Prescription:  Initial Exercise Prescription - 09/02/21 0800       Date of Initial Exercise RX and Referring Provider   Date 09/02/21    Referring Provider Dr. Annamaria Boots    Expected Discharge Date 01/06/22      Treadmill   MPH 1    Grade 0    Minutes 17      NuStep   Level 1    SPM 70    Minutes 22      Prescription Details   Frequency (times per week) 2    Duration Progress to 30 minutes of continuous aerobic without signs/symptoms of physical distress      Intensity   THRR 40-80% of Max Heartrate 56-111    Ratings of Perceived Exertion 11-13    Perceived Dyspnea 0-4      Resistance Training   Training Prescription Yes    Weight 2    Reps 10-15             Perform Capillary Blood Glucose checks as needed.  Exercise Prescription Changes:   Exercise Prescription Changes     Row Name 09/16/21 1200 10/02/21 1500 10/14/21 1300 10/28/21 1200 11/11/21 1200     Response to Exercise   Blood Pressure (Admit) 130/60 112/50 120/58 126/60 120/54   Blood Pressure (Exercise) 160/50 180/50 162/50 158/50 150/54   Blood Pressure (Exit) 110/50 110/80 112/56 130/58 106/40   Heart Rate (Admit) 62 bpm 86 bpm 64 bpm 67 bpm 64 bpm   Heart Rate (Exercise) 110 bpm 101 bpm 80 bpm 84 bpm 79 bpm   Heart Rate (Exit) 59 bpm 70 bpm 61 bpm 62 bpm 56 bpm   Oxygen Saturation (Admit) 98 % 97 % 97 % 96 % 96 %   Oxygen Saturation (Exercise) 95 % 95 % 95 % 97 % 96 %   Oxygen Saturation (Exit) 98 % 97 % 97 % 96 % 96 %    Rating of Perceived Exertion (Exercise) '15 13 12 12 13   '$ Perceived Dyspnea (Exercise) '15 12 13 13 13   '$ Duration Continue with 30 min of aerobic exercise without signs/symptoms of physical distress. Continue with 30 min of aerobic exercise without signs/symptoms of physical distress. Continue with 30 min of aerobic exercise without signs/symptoms of physical distress. Continue with 30 min of aerobic exercise without signs/symptoms of physical distress. Continue with 30 min of aerobic exercise without signs/symptoms of physical distress.   Intensity THRR unchanged THRR unchanged THRR unchanged THRR unchanged THRR unchanged     Progression   Progression Continue to progress workloads to maintain intensity without signs/symptoms of physical distress. Continue to progress workloads to maintain intensity without signs/symptoms of physical distress. Continue to progress workloads to maintain intensity without signs/symptoms of physical distress. Continue to progress workloads to maintain intensity without signs/symptoms of physical distress. Continue to progress workloads to maintain intensity without signs/symptoms of physical distress.     Resistance Training   Training Prescription Yes Yes Yes Yes Yes   Weight '2 3 3 3 3   '$ Reps 10-15 10-15 10-15 10-15 10-15   Time 10 Minutes 10 Minutes 10 Minutes 10 Minutes 10 Minutes     Treadmill   MPH 1.2 1.2 1.3 1.3 1.3   Grade 0 0 0 0 0   Minutes '17 17 17 17 17   '$ METs 1.92 1.92 '2 2 2     '$ NuStep  Level '1 2 2 2 2   '$ SPM 97 104 108 113 99   Minutes '22 22 22 22 22   '$ METs 2 2.1 2.3 2.3 2            Exercise Comments:   Exercise Goals and Review:   Exercise Goals     Row Name 09/02/21 0854 09/16/21 1247 10/14/21 1304 11/11/21 1243       Exercise Goals   Increase Physical Activity Yes Yes Yes Yes    Intervention Provide advice, education, support and counseling about physical activity/exercise needs.;Develop an individualized exercise  prescription for aerobic and resistive training based on initial evaluation findings, risk stratification, comorbidities and participant's personal goals. Provide advice, education, support and counseling about physical activity/exercise needs.;Develop an individualized exercise prescription for aerobic and resistive training based on initial evaluation findings, risk stratification, comorbidities and participant's personal goals. Provide advice, education, support and counseling about physical activity/exercise needs.;Develop an individualized exercise prescription for aerobic and resistive training based on initial evaluation findings, risk stratification, comorbidities and participant's personal goals. Provide advice, education, support and counseling about physical activity/exercise needs.;Develop an individualized exercise prescription for aerobic and resistive training based on initial evaluation findings, risk stratification, comorbidities and participant's personal goals.    Expected Outcomes Short Term: Attend rehab on a regular basis to increase amount of physical activity.;Long Term: Add in home exercise to make exercise part of routine and to increase amount of physical activity.;Long Term: Exercising regularly at least 3-5 days a week. Short Term: Attend rehab on a regular basis to increase amount of physical activity.;Long Term: Add in home exercise to make exercise part of routine and to increase amount of physical activity.;Long Term: Exercising regularly at least 3-5 days a week. Short Term: Attend rehab on a regular basis to increase amount of physical activity.;Long Term: Add in home exercise to make exercise part of routine and to increase amount of physical activity.;Long Term: Exercising regularly at least 3-5 days a week. Short Term: Attend rehab on a regular basis to increase amount of physical activity.;Long Term: Add in home exercise to make exercise part of routine and to increase amount  of physical activity.;Long Term: Exercising regularly at least 3-5 days a week.    Increase Strength and Stamina Yes Yes Yes Yes    Intervention Provide advice, education, support and counseling about physical activity/exercise needs.;Develop an individualized exercise prescription for aerobic and resistive training based on initial evaluation findings, risk stratification, comorbidities and participant's personal goals. Provide advice, education, support and counseling about physical activity/exercise needs.;Develop an individualized exercise prescription for aerobic and resistive training based on initial evaluation findings, risk stratification, comorbidities and participant's personal goals. Provide advice, education, support and counseling about physical activity/exercise needs.;Develop an individualized exercise prescription for aerobic and resistive training based on initial evaluation findings, risk stratification, comorbidities and participant's personal goals. Provide advice, education, support and counseling about physical activity/exercise needs.;Develop an individualized exercise prescription for aerobic and resistive training based on initial evaluation findings, risk stratification, comorbidities and participant's personal goals.    Expected Outcomes Short Term: Increase workloads from initial exercise prescription for resistance, speed, and METs.;Short Term: Perform resistance training exercises routinely during rehab and add in resistance training at home;Long Term: Improve cardiorespiratory fitness, muscular endurance and strength as measured by increased METs and functional capacity (6MWT) Short Term: Increase workloads from initial exercise prescription for resistance, speed, and METs.;Short Term: Perform resistance training exercises routinely during rehab and add in resistance training at home;Long  Term: Improve cardiorespiratory fitness, muscular endurance and strength as measured by  increased METs and functional capacity (6MWT) Short Term: Increase workloads from initial exercise prescription for resistance, speed, and METs.;Short Term: Perform resistance training exercises routinely during rehab and add in resistance training at home;Long Term: Improve cardiorespiratory fitness, muscular endurance and strength as measured by increased METs and functional capacity (6MWT) Short Term: Increase workloads from initial exercise prescription for resistance, speed, and METs.;Short Term: Perform resistance training exercises routinely during rehab and add in resistance training at home;Long Term: Improve cardiorespiratory fitness, muscular endurance and strength as measured by increased METs and functional capacity (6MWT)    Able to understand and use rate of perceived exertion (RPE) scale Yes Yes Yes Yes    Intervention Provide education and explanation on how to use RPE scale Provide education and explanation on how to use RPE scale Provide education and explanation on how to use RPE scale Provide education and explanation on how to use RPE scale    Expected Outcomes Short Term: Able to use RPE daily in rehab to express subjective intensity level;Long Term:  Able to use RPE to guide intensity level when exercising independently Short Term: Able to use RPE daily in rehab to express subjective intensity level;Long Term:  Able to use RPE to guide intensity level when exercising independently Short Term: Able to use RPE daily in rehab to express subjective intensity level;Long Term:  Able to use RPE to guide intensity level when exercising independently Short Term: Able to use RPE daily in rehab to express subjective intensity level;Long Term:  Able to use RPE to guide intensity level when exercising independently    Able to understand and use Dyspnea scale Yes Yes Yes Yes    Intervention Provide education and explanation on how to use Dyspnea scale Provide education and explanation on how to use  Dyspnea scale Provide education and explanation on how to use Dyspnea scale Provide education and explanation on how to use Dyspnea scale    Expected Outcomes Short Term: Able to use Dyspnea scale daily in rehab to express subjective sense of shortness of breath during exertion;Long Term: Able to use Dyspnea scale to guide intensity level when exercising independently Short Term: Able to use Dyspnea scale daily in rehab to express subjective sense of shortness of breath during exertion;Long Term: Able to use Dyspnea scale to guide intensity level when exercising independently Short Term: Able to use Dyspnea scale daily in rehab to express subjective sense of shortness of breath during exertion;Long Term: Able to use Dyspnea scale to guide intensity level when exercising independently Short Term: Able to use Dyspnea scale daily in rehab to express subjective sense of shortness of breath during exertion;Long Term: Able to use Dyspnea scale to guide intensity level when exercising independently    Knowledge and understanding of Target Heart Rate Range (THRR) Yes Yes Yes Yes    Intervention Provide education and explanation of THRR including how the numbers were predicted and where they are located for reference Provide education and explanation of THRR including how the numbers were predicted and where they are located for reference Provide education and explanation of THRR including how the numbers were predicted and where they are located for reference Provide education and explanation of THRR including how the numbers were predicted and where they are located for reference    Expected Outcomes Long Term: Able to use THRR to govern intensity when exercising independently;Short Term: Able to use daily as guideline for  intensity in rehab;Short Term: Able to state/look up THRR Long Term: Able to use THRR to govern intensity when exercising independently;Short Term: Able to use daily as guideline for intensity in  rehab;Short Term: Able to state/look up THRR Long Term: Able to use THRR to govern intensity when exercising independently;Short Term: Able to use daily as guideline for intensity in rehab;Short Term: Able to state/look up THRR Long Term: Able to use THRR to govern intensity when exercising independently;Short Term: Able to use daily as guideline for intensity in rehab;Short Term: Able to state/look up THRR    Understanding of Exercise Prescription Yes Yes Yes Yes    Intervention Provide education, explanation, and written materials on patient's individual exercise prescription Provide education, explanation, and written materials on patient's individual exercise prescription Provide education, explanation, and written materials on patient's individual exercise prescription Provide education, explanation, and written materials on patient's individual exercise prescription    Expected Outcomes Short Term: Able to explain program exercise prescription;Long Term: Able to explain home exercise prescription to exercise independently Short Term: Able to explain program exercise prescription;Long Term: Able to explain home exercise prescription to exercise independently Short Term: Able to explain program exercise prescription;Long Term: Able to explain home exercise prescription to exercise independently Short Term: Able to explain program exercise prescription;Long Term: Able to explain home exercise prescription to exercise independently             Exercise Goals Re-Evaluation :  Exercise Goals Re-Evaluation     [place] Name 09/16/21 1247 10/14/21 1305 11/11/21 1244         Exercise Goal Re-Evaluation   Exercise Goals Review Increase Physical Activity;Increase Strength and Stamina;Able to understand and use rate of perceived exertion (RPE) scale;Able to understand and use Dyspnea scale;Knowledge and understanding of Target Heart Rate Range (THRR);Understanding of Exercise Prescription Increase Physical  Activity;Increase Strength and Stamina;Able to understand and use rate of perceived exertion (RPE) scale;Able to understand and use Dyspnea scale;Knowledge and understanding of Target Heart Rate Range (THRR);Understanding of Exercise Prescription Increase Physical Activity;Increase Strength and Stamina;Able to understand and use rate of perceived exertion (RPE) scale;Able to understand and use Dyspnea scale;Able to check pulse independently;Understanding of Exercise Prescription     Comments Pt has completed 4 sessions in pulmonary rehab. She is motivated and wants to push herself during class to increase her workloads. She has notived that she is able to do yard work outside without having to stop frequently due to SOB. She is currently exercising at 2.0 METs on the stepper. Will continue to monitor and progress as able. Pt has completed 11 sessions of PR. She continues to be motivated and pushes herself during class and has increased her workload. She goes to physical therapy two days a week. She is currently exercising at 2.3 METs on the stepper. Pt has completed 19 sessions of PR. She continues to push heself and is motivated to increase her workloads. She continues to do physical therapy once a week and is doing home exercises everyday. She is currently exercising at 2.0 METs on the stepper. Will continue to monitor and progress as able.     Expected Outcomes Through exercise at rehab and at home, the patient will meet their stated goals. Through exercise at rehab and at home, the patient will meet their stated goals. Through exercise at rehab and at home, the patient will meet their stated goals.              Discharge Exercise Prescription (Final  Exercise Prescription Changes):  Exercise Prescription Changes - 11/11/21 1200       Response to Exercise   Blood Pressure (Admit) 120/54    Blood Pressure (Exercise) 150/54    Blood Pressure (Exit) 106/40    Heart Rate (Admit) 64 bpm    Heart Rate  (Exercise) 79 bpm    Heart Rate (Exit) 56 bpm    Oxygen Saturation (Admit) 96 %    Oxygen Saturation (Exercise) 96 %    Oxygen Saturation (Exit) 96 %    Rating of Perceived Exertion (Exercise) 13    Perceived Dyspnea (Exercise) 13    Duration Continue with 30 min of aerobic exercise without signs/symptoms of physical distress.    Intensity THRR unchanged      Progression   Progression Continue to progress workloads to maintain intensity without signs/symptoms of physical distress.      Resistance Training   Training Prescription Yes    Weight 3    Reps 10-15    Time 10 Minutes      Treadmill   MPH 1.3    Grade 0    Minutes 17    METs 2      NuStep   Level 2    SPM 99    Minutes 22    METs 2             Nutrition:  Target Goals: Understanding of nutrition guidelines, daily intake of sodium '1500mg'$ , cholesterol '200mg'$ , calories 30% from fat and 7% or less from saturated fats, daily to have 5 or more servings of fruits and vegetables.  Biometrics:  Pre Biometrics - 09/02/21 0855       Pre Biometrics   Height 5' 1.5" (1.562 m)    Weight 89.2 kg    Waist Circumference 46 inches    Hip Circumference 47 inches    Waist to Hip Ratio 0.98 %    BMI (Calculated) 36.56    Triceps Skinfold 8 mm    % Body Fat 42.7 %    Grip Strength 24.8 kg    Flexibility 0 in    Single Leg Stand 0 seconds              Nutrition Therapy Plan and Nutrition Goals:  Nutrition Therapy & Goals - 09/17/21 0727       Personal Nutrition Goals   Comments Patient scored 71 on his diet assessment. We offer 2 educational sessions on healthy nutrition with handouts and assistance with RD referral if patient is interested.      Intervention Plan   Intervention Nutrition handout(s) given to patient.    Expected Outcomes Short Term Goal: Understand basic principles of dietary content, such as calories, fat, sodium, cholesterol and nutrients.             Nutrition Assessments:   Nutrition Assessments - 09/02/21 0827       MEDFICTS Scores   Pre Score 71            MEDIFICTS Score Key: ?70 Need to make dietary changes  40-70 Heart Healthy Diet ? 40 Therapeutic Level Cholesterol Diet   Picture Your Plate Scores: <66 Unhealthy dietary pattern with much room for improvement. 41-50 Dietary pattern unlikely to meet recommendations for good health and room for improvement. 51-60 More healthful dietary pattern, with some room for improvement.  >60 Healthy dietary pattern, although there may be some specific behaviors that could be improved.    Nutrition Goals Re-Evaluation:   Nutrition Goals  Discharge (Final Nutrition Goals Re-Evaluation):   Psychosocial: Target Goals: Acknowledge presence or absence of significant depression and/or stress, maximize coping skills, provide positive support system. Participant is able to verbalize types and ability to use techniques and skills needed for reducing stress and depression.  Initial Review & Psychosocial Screening:  Initial Psych Review & Screening - 09/02/21 0854       Initial Review   Current issues with None Identified      Family Dynamics   Good Support System? Yes    Comments Her support system includes her two nieces and the members of her church.      Barriers   Psychosocial barriers to participate in program There are no identifiable barriers or psychosocial needs.      Screening Interventions   Interventions Encouraged to exercise    Expected Outcomes Long Term goal: The participant improves quality of Life and PHQ9 Scores as seen by post scores and/or verbalization of changes;Short Term goal: Identification and review with participant of any Quality of Life or Depression concerns found by scoring the questionnaire.             Quality of Life Scores:  Quality of Life - 09/02/21 0856       Quality of Life   Select Quality of Life      Quality of Life Scores   Health/Function Pre 23.2  %    Socioeconomic Pre 26.08 %    Psych/Spiritual Pre 24.64 %    Family Pre 23.5 %    GLOBAL Pre 24.11 %            Scores of 19 and below usually indicate a poorer quality of life in these areas.  A difference of  2-3 points is a clinically meaningful difference.  A difference of 2-3 points in the total score of the Quality of Life Index has been associated with significant improvement in overall quality of life, self-image, physical symptoms, and general health in studies assessing change in quality of life.   PHQ-9: Review Flowsheet       09/02/2021 03/18/2018 10/01/2017  Depression screen PHQ 2/9  Decreased Interest 0 0 0  Down, Depressed, Hopeless 0 0 0  PHQ - 2 Score 0 0 0  Altered sleeping '2 1 2  '$ Tired, decreased energy '2 2 2  '$ Change in appetite 0 2 2  Feeling bad or failure about yourself  0 0 0  Trouble concentrating 0 2 0  Moving slowly or fidgety/restless 0 0 0  Suicidal thoughts 0 0 0  PHQ-9 Score '4 7 6  '$ Difficult doing work/chores Somewhat difficult Not difficult at all Not difficult at all   Interpretation of Total Score  Total Score Depression Severity:  1-4 = Minimal depression, 5-9 = Mild depression, 10-14 = Moderate depression, 15-19 = Moderately severe depression, 20-27 = Severe depression   Psychosocial Evaluation and Intervention:  Psychosocial Evaluation - 09/02/21 3664       Psychosocial Evaluation & Interventions   Interventions Encouraged to exercise with the program and follow exercise prescription    Comments Pt has no barriers to participating in PR. She has no identifiable psychsocial issues. She scored a 4 on her PHQ-9 and this relates to her sleeping too much due to her lack of energy. She believes that this is due to her chronic lung condition and her atrial fibrillation. She reports that she took antidepressants about 15 years ago when her mother passed away, but she did not like  the way that the medications made her feel, and that she would not  consider taking these medications again. She is also participating in PT twice per week with the hopes that she will be able to strengthen her body. She reports that her support system includes her two nieces and her church family. Her goals while in the program are to decrease her SOB with ADL's, improve her energy levels, and to be able to walk longer distances. She specifically states that she would like to be able to take a 30 minute walk without stopping. She has a positive mindset and is eager to start the program. She previously participated in [place] in 2019, and she felt like it helped her a lot then.    Expected Outcomes Pt will continue to have no identifiable psychosocial issues.    Continue Psychosocial Services  No Follow up required             Psychosocial Re-Evaluation:  Psychosocial Re-Evaluation     [place] Name 09/08/21 1441 09/08/21 1455 09/12/21 0917 09/12/21 0930 10/08/21 1333     Psychosocial Re-Evaluation   Current issues with -- None Identified -- None Identified None Identified   Comments Patient is on session 2 and is tolerating the program well.  Patient has had one educational session on stress management.  We will continue to monitor. Patient is on session 1 and is tolerating the program well.  Patient has had one educational session on stress management.  We will continue to monitor. Patient is on session 4 and is tolerating the program well.  We will continue to monitor. Patient is on session 4 and is tolerating the program well.  Patient is tolerating NuStep and Treadmill well.  Sats are 95-98% on RA, no resp distress noted.  VSS.  Patient has no identifiable issues.  We will continue to monitor. Patient is on session 10 and is tolerating the program well.  Patient has no identifiable issues.  We will continue to monitor.   Expected Outcomes Patient will have no psychosocial issues identified at discharge.  Patient will have no psychosocial issues identified at discharge.   -- Patient will have no psychosocial issues identified at discharge. Patient will have no psychosocial issues identified at discharge.   Interventions Relaxation education;Encouraged to attend Pulmonary Rehabilitation for the exercise;Stress management education Relaxation education;Encouraged to attend Pulmonary Rehabilitation for the exercise;Stress management education -- Relaxation education;Encouraged to attend Pulmonary Rehabilitation for the exercise;Stress management education Relaxation education;Encouraged to attend Pulmonary Rehabilitation for the exercise;Stress management education   Continue Psychosocial Services  No Follow up required No Follow up required -- No Follow up required No Follow up required    [place] Name 11/04/21 0857 11/04/21 9476           Psychosocial Re-Evaluation   Current issues with -- None Identified      Comments Patient is on session 10 and is tolerating the program well.  Patient has no identifiable issues.  We will continue to monitor. (P)  Patient is on session 17 and is tolerating the program well.  Patient has no identifiable issues.  We will continue to monitor.      Expected Outcomes Patient will have no psychosocial issues identified at discharge. (P)  Patient will have no psychosocial issues identified at discharge.      Interventions Relaxation education;Encouraged to attend Pulmonary Rehabilitation for the exercise;Stress management education (P)  Relaxation education;Encouraged to attend Pulmonary Rehabilitation for the exercise;Stress management education  Continue Psychosocial Services  No Follow up required (P)  No Follow up required               Psychosocial Discharge (Final Psychosocial Re-Evaluation):  Psychosocial Re-Evaluation - 11/04/21 2202       Psychosocial Re-Evaluation   Current issues with None Identified    Comments Patient is on session 17 and is tolerating the program well.  Patient has no identifiable issues.  We will  continue to monitor.    Expected Outcomes Patient will have no psychosocial issues identified at discharge.    Interventions Relaxation education;Encouraged to attend Pulmonary Rehabilitation for the exercise;Stress management education    Continue Psychosocial Services  No Follow up required              Education: Education Goals: Education classes will be provided on a weekly basis, covering required topics. Participant will state understanding/return demonstration of topics presented.  Learning Barriers/Preferences:  Learning Barriers/Preferences - 09/02/21 0857       Learning Barriers/Preferences   Learning Barriers None    Learning Preferences Skilled Demonstration;Individual Instruction;Group Instruction             Education Topics: How Lungs Work and Diseases: - Discuss the anatomy of the lungs and diseases that can affect the lungs, such as COPD. Flowsheet Row PULMONARY REHAB OTHER RESPIRATORY from 11/06/2021 in [place]  Date 10/02/21  Educator Handout  Instruction Review Code 1- Verbalizes Understanding       Exercise: -Discuss the importance of exercise, FITT principles of exercise, normal and abnormal responses to exercise, and how to exercise safely.   Environmental Irritants: -Discuss types of environmental irritants and how to limit exposure to environmental irritants. Flowsheet Row PULMONARY REHAB OTHER RESPIRATORY from 03/10/2018 in [place]  Date 11/11/17  Educator DWynetta Emery  Instruction Review Code 2- Demonstrated Understanding       Meds/Inhalers and oxygen: - Discuss respiratory medications, definition of an inhaler and oxygen, and the proper way to use an inhaler and oxygen. Flowsheet Row PULMONARY REHAB OTHER RESPIRATORY from 11/06/2021 in [place]  Date 10/16/21  Educator HJ       Energy Saving Techniques: - Discuss methods to conserve energy and decrease  shortness of breath when performing activities of daily living.  Flowsheet Row PULMONARY REHAB OTHER RESPIRATORY from 11/06/2021 in [place]  Date 10/23/21  Educator DF  Instruction Review Code 1- Verbalizes Understanding       Bronchial Hygiene / Breathing Techniques: - Discuss breathing mechanics, pursed-lip breathing technique,  proper posture, effective ways to clear airways, and other functional breathing techniques Flowsheet Row PULMONARY REHAB OTHER RESPIRATORY from 11/06/2021 in [place]  Date 10/30/21  Educator pb  Instruction Review Code 1- Verbalizes Understanding       Cleaning Equipment: - Provides group verbal and written instruction about the health risks of elevated stress, cause of high stress, and healthy ways to reduce stress. Flowsheet Row PULMONARY REHAB OTHER RESPIRATORY from 11/06/2021 in [place]  Date 11/06/21  Educator DF  Instruction Review Code 2- Demonstrated Understanding       Nutrition I: Fats: - Discuss the types of cholesterol, what cholesterol does to the body, and how cholesterol levels can be controlled. Flowsheet Row PULMONARY REHAB OTHER RESPIRATORY from 03/10/2018 in [place]  Date 12/16/17  Educator Etheleen Mayhew  Instruction Review Code 2- Demonstrated Understanding  Nutrition II: Labels: -Discuss the different components of food labels and how to read food labels. Flowsheet Row PULMONARY REHAB OTHER RESPIRATORY from 03/10/2018 in [place]  Date 12/23/17  Educator Etheleen Mayhew  Instruction Review Code 2- Demonstrated Understanding       Respiratory Infections: - Discuss the signs and symptoms of respiratory infections, ways to prevent respiratory infections, and the importance of seeking medical treatment when having a respiratory infection. Flowsheet Row PULMONARY REHAB OTHER RESPIRATORY from 03/10/2018  in [place]  Date 01/06/18  Educator Etheleen Mayhew  Instruction Review Code 2- Demonstrated Understanding       Stress I: Signs and Symptoms: - Discuss the causes of stress, how stress may lead to anxiety and depression, and ways to limit stress. Flowsheet Row PULMONARY REHAB OTHER RESPIRATORY from 09/04/2021 in [facility]  Date 09/03/21  Educator [place]  Instruction Review Code 1- Verbalizes Understanding       Stress II: Relaxation: -Discuss relaxation techniques to limit stress. Flowsheet Row PULMONARY REHAB OTHER RESPIRATORY from 11/06/2021 in [place]  Date 09/11/21  Instruction Review Code 1- Verbalizes Understanding       Oxygen for Home/Travel: - Discuss how to prepare for travel when on oxygen and proper ways to transport and store oxygen to ensure safety. Flowsheet Row PULMONARY REHAB OTHER RESPIRATORY from 11/06/2021 in [place]  Date 09/18/21  Educator hj  Instruction Review Code 1- Verbalizes Understanding       Knowledge Questionnaire Score:  Knowledge Questionnaire Score - 09/02/21 0824       Knowledge Questionnaire Score   Pre Score 13/18             Core Components/Risk Factors/Patient Goals at Admission:  Personal Goals and Risk Factors at Admission - 09/02/21 0900       Core Components/Risk Factors/Patient Goals on Admission    Weight Management Yes;Obesity;Weight Loss    Intervention Weight Management: Develop a combined nutrition and exercise program designed to reach desired caloric intake, while maintaining appropriate intake of nutrient and fiber, sodium and fats, and appropriate energy expenditure required for the weight goal.;Weight Management: Provide education and appropriate resources to help participant work on and attain dietary goals.;Weight Management/Obesity: Establish reasonable short term and long term weight goals.;Obesity: Provide  education and appropriate resources to help participant work on and attain dietary goals.    Expected Outcomes Short Term: Continue to assess and modify interventions until short term weight is achieved;Long Term: Adherence to nutrition and physical activity/exercise program aimed toward attainment of established weight goal;Weight Maintenance: Understanding of the daily nutrition guidelines, which includes 25-35% calories from fat, 7% or less cal from saturated fats, less than '200mg'$  cholesterol, less than 1.5gm of sodium, & 5 or more servings of fruits and vegetables daily;Weight Loss: Understanding of general recommendations for a balanced deficit meal plan, which promotes 1-2 lb weight loss per week and includes a negative energy balance of [phone] kcal/d;Understanding recommendations for meals to include 15-35% energy as protein, 25-35% energy from fat, 35-60% energy from carbohydrates, less than '200mg'$  of dietary cholesterol, 20-35 gm of total fiber daily;Understanding of distribution of calorie intake throughout the day with the consumption of 4-5 meals/snacks    Improve shortness of breath with ADL's Yes    Intervention Provide education, individualized exercise plan and daily activity instruction to help decrease symptoms of SOB with activities of daily living.    Expected Outcomes Short Term: Improve cardiorespiratory fitness  to achieve a reduction of symptoms when performing ADLs;Long Term: Be able to perform more ADLs without symptoms or delay the onset of symptoms    Personal Goal Other Yes    Personal Goal She wants to improve her energy level and to be able to walk longer distances.    Intervention Attend CR 3 x week and supplement with at home exercise 2 x week.     Expected Outcomes Achieve personal goals.              Core Components/Risk Factors/Patient Goals Review:   Goals and Risk Factor Review     Row Name 09/08/21 1445 09/12/21 0915 09/12/21 0937 10/08/21 1334 11/04/21 0921      Core Components/Risk Factors/Patient Goals Review   Personal Goals Review -- -- -- Improve shortness of breath with ADL's;Develop more efficient breathing techniques such as purse lipped breathing and diaphragmatic breathing and practicing self-pacing with activity.;Increase knowledge of respiratory medications and ability to use respiratory devices properly. Improve shortness of breath with ADL's;Develop more efficient breathing techniques such as purse lipped breathing and diaphragmatic breathing and practicing self-pacing with activity.;Increase knowledge of respiratory medications and ability to use respiratory devices properly.   Review Patient completed 1 session. She has completed this program in the passed and is doing well in this program. She has recently had chronic hip pain which limitions.  She  says the program helped her alot. She has more strength and stamina now with increased energy.  She plans to continue exercising at the [facility] and by walking with some friends. We will continue to monitor. Patient completed 1 session. She has completed this program in the passed and is doing well in this program. She has recently had chronic hip pain which limitions.  She  says the program helped her alot. She has more strength and stamina now with increased energy.  She plans to continue exercising at the [facility] and by walking with some friends. We will continue to monitor. Patient completed 4 session. She has completed this program in the passed and is doing well in this program. She has recently had chronic hip pain which limitions.  She  says the program helped her alot. She has more strength and stamina now with increased energy.  She plans to continue exercising at the [facility] and by walking with some friends. We will continue to monitor. Patient completed 10 session.  Pt has chronic hip pain which limitations.  Pt will benefit from the  program. She will have more strength and stamina now with increased  energy, which are her goals. Patient completed 17 session.  Pt has chronic hip pain which limitations.  Pt will benefit from the  program. She will have more strength and stamina now with increased energy, which are her goals.   Expected Outcomes Patient will  complete the program and meet her personal goals. -- Patient will  complete the program and meet her personal goals. Patient will  complete the program and meet her personal goals. Patient will  complete the program and meet her personal goals.            Core Components/Risk Factors/Patient Goals at Discharge (Final Review):   Goals and Risk Factor Review - 11/04/21 0921       Core Components/Risk Factors/Patient Goals Review   Personal Goals Review Improve shortness of breath with ADL's;Develop more efficient breathing techniques such as purse lipped breathing and diaphragmatic breathing and practicing self-pacing with activity.;Increase knowledge of respiratory  medications and ability to use respiratory devices properly.    Review Patient completed 17 session.  Pt has chronic hip pain which limitations.  Pt will benefit from the  program. She will have more strength and stamina now with increased energy, which are her goals.    Expected Outcomes Patient will  complete the program and meet her personal goals.             ITP Comments:   Comments: ITP REVIEW Pt is making expected progress toward pulmonary rehab goals after completing 20 sessions. Recommend continued exercise, life style modification, education, and utilization of breathing techniques to increase stamina and strength and decrease shortness of breath with exertion.

## 2021-11-13 ENCOUNTER — Encounter (HOSPITAL_COMMUNITY)
Admission: RE | Admit: 2021-11-13 | Discharge: 2021-11-13 | Disposition: A | Discharge status: Home / Self Care | Payer: Medicare Other | Source: Ambulatory Visit | Attending: Internal Medicine | Admitting: Internal Medicine

## 2021-11-13 DIAGNOSIS — J4540 Moderate persistent asthma, uncomplicated: Secondary | ICD-10-CM

## 2021-11-13 NOTE — Progress Notes (Signed)
Daily Session Note  Patient Details  Name: [name] [name] MRN: [id number] Date of Birth: Nov 21, 1940 Referring Provider:   Flowsheet Row PULMONARY REHAB OTHER RESP ORIENTATION from 09/02/2021 in [place]  Referring Provider Dr. Annamaria Boots       Encounter Date: 11/13/2021  Check In:  Session Check In - 11/13/21 1041       Check-In   Supervising physician immediately available to respond to emergencies [facility] MD immediately available    Physician(s) Johnsie Cancel    Location [facility]    Staff Present Redge Gainer, BS, Exercise Physiologist;Marcelo Ickes Wynetta Emery, RN, BSN;Phyllis Billingsley, RN    Virtual Visit No    Medication changes reported     No    Fall or balance concerns reported    Yes    Comments She has not fallen but walks with a cane for support. 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    Tobacco Cessation No Change    Warm-up and Cool-down Performed as group-led instruction    Resistance Training Performed Yes    VAD Patient? No    PAD/SET Patient? No      Pain Assessment   Currently in Pain? No/denies    Pain Score 0-No pain    Multiple Pain Sites No             Capillary Blood Glucose: No results found for this or any previous visit (from the past 24 hour(s)).    Social History   Tobacco Use  Smoking Status Never  Smokeless Tobacco Never    Goals Met:  Proper associated with RPD/PD & O2 Sat Independence with exercise equipment Using PLB without cueing & demonstrates good technique Exercise tolerated well No report of concerns or symptoms today Strength training completed today  Goals Unmet:  Not Applicable  Comments: Check out 1145.   Dr. Kathie Dike is Medical Director for [facility] Pulmonary Rehab.

## 2021-11-18 ENCOUNTER — Encounter (HOSPITAL_COMMUNITY)
Admission: RE | Admit: 2021-11-18 | Discharge: 2021-11-18 | Disposition: A | Discharge status: Home / Self Care | Payer: Medicare Other | Source: Ambulatory Visit | Attending: Internal Medicine | Admitting: Internal Medicine

## 2021-11-18 DIAGNOSIS — J4540 Moderate persistent asthma, uncomplicated: Secondary | ICD-10-CM

## 2021-11-18 NOTE — Progress Notes (Signed)
Daily Session Note  Patient Details  Name: MYLYNN DINH MRN: [id number] Date of Birth: [birth year] Referring Provider:   Flowsheet Row PULMONARY REHAB OTHER RESP ORIENTATION from 09/02/2021 in [place]  Referring Provider Dr. Annamaria Boots       Encounter Date: 11/18/2021  Check In:  Session Check In - 11/18/21 1041       Check-In   Supervising physician immediately available to respond to emergencies [facility] immediately available    Physician(s) Dr Harl Bowie    Location [facility]    Staff Present Redge Gainer, BS, Exercise Physiologist;Matheu Ploeger Hassell Done, RN, Bjorn Loser, MS, ACSM-CEP, Exercise Physiologist    Virtual Visit No    Medication changes reported     No    Fall or balance concerns reported    Yes    Comments She has not fallen but walks with a cane for support. 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    Tobacco Cessation No Change    Warm-up and Cool-down Performed as group-led instruction    Resistance Training Performed Yes    VAD Patient? No    PAD/SET Patient? No      Pain Assessment   Currently in Pain? No/denies    Pain Score 0-No pain    Multiple Pain Sites No             Capillary Blood Glucose: No results found for this or any previous visit (from the past 24 hour(s)).    Social History   Tobacco Use  Smoking Status Never  Smokeless Tobacco Never    Goals Met:  Proper associated with RPD/PD & O2 Sat Independence with exercise equipment Using PLB without cueing & demonstrates good technique Exercise tolerated well Queuing for purse lip breathing No report of concerns or symptoms today Strength training completed today  Goals Unmet:  Not Applicable  Comments: Checkout at 1145.   Dr. Kathie Dike is Medical Director for [facility] Pulmonary Rehab.

## 2021-11-19 ENCOUNTER — Ambulatory Visit (INDEPENDENT_AMBULATORY_CARE_PROVIDER_SITE_OTHER): Payer: Medicare Other

## 2021-11-19 VITALS — BP 170/62 | HR 84 | Temp 97.7°F | Resp 16 | Ht 61.0 in | Wt 197.0 lb

## 2021-11-19 DIAGNOSIS — J4520 Mild intermittent asthma, uncomplicated: Secondary | ICD-10-CM

## 2021-11-19 DIAGNOSIS — Z713 Dietary counseling and surveillance: Secondary | ICD-10-CM | POA: Diagnosis not present

## 2021-11-19 DIAGNOSIS — J441 Chronic obstructive pulmonary disease with (acute) exacerbation: Secondary | ICD-10-CM | POA: Diagnosis not present

## 2021-11-19 DIAGNOSIS — Z6836 Body mass index (BMI) 36.0-36.9, adult: Secondary | ICD-10-CM | POA: Diagnosis not present

## 2021-11-19 DIAGNOSIS — R509 Fever, unspecified: Secondary | ICD-10-CM | POA: Diagnosis not present

## 2021-11-19 DIAGNOSIS — Z299 Encounter for prophylactic measures, unspecified: Secondary | ICD-10-CM | POA: Diagnosis not present

## 2021-11-19 MED ORDER — MEPOLIZUMAB 100 MG ~~LOC~~ SOLR
100.0000 mg | Freq: Once | SUBCUTANEOUS | Status: AC
  Administered 2021-11-19: 100 mg via SUBCUTANEOUS
  Filled 2021-11-19: qty 1

## 2021-11-19 NOTE — Progress Notes (Signed)
Diagnosis: Asthma  Provider:  Marshell Garfinkel MD  Procedure: Injection  Nucala (Mepolizumab), Dose: 100 mg, Site: subcutaneous, Number of injections: 1  Discharge: Condition: Good, Destination: Home . AVS Declined  Performed by:  Arnoldo Morale, RN

## 2021-11-20 ENCOUNTER — Encounter (HOSPITAL_COMMUNITY)
Admission: RE | Admit: 2021-11-20 | Discharge: 2021-11-20 | Disposition: A | Discharge status: Home / Self Care | Payer: Medicare Other | Source: Ambulatory Visit | Attending: Internal Medicine | Admitting: Internal Medicine

## 2021-11-20 DIAGNOSIS — J4540 Moderate persistent asthma, uncomplicated: Secondary | ICD-10-CM

## 2021-11-20 NOTE — Progress Notes (Signed)
Daily Session Note  Patient Details  Name: [name] [name] MRN: [id number] Date of Birth: 1941/03/21 Referring Provider:   Flowsheet Row PULMONARY REHAB OTHER RESP ORIENTATION from 09/02/2021 in [place]  Referring Provider Dr. Annamaria Boots       Encounter Date: 11/20/2021  Check In:  Session Check In - 11/20/21 1043       Check-In   Supervising physician immediately available to respond to emergencies [facility] immediately available    Physician(s) Dr Harl Bowie    Location [facility]    Staff Present Redge Gainer, BS, Exercise Physiologist;Laiklyn Pilkenton Hassell Done, RN, BSN    Virtual Visit No    Medication changes reported     No    Fall or balance concerns reported    Yes    Comments She has not fallen but walks with a cane for support. 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    Tobacco Cessation No Change    Warm-up and Cool-down Performed as group-led instruction    Resistance Training Performed Yes    VAD Patient? No    PAD/SET Patient? No      Pain Assessment   Currently in Pain? No/denies    Pain Score 0-No pain    Multiple Pain Sites No             Capillary Blood Glucose: No results found for this or any previous visit (from the past 24 hour(s)).    Social History   Tobacco Use  Smoking Status Never  Smokeless Tobacco Never    Goals Met:  Proper associated with RPD/PD & O2 Sat Independence with exercise equipment Exercise tolerated well Queuing for purse lip breathing No report of concerns or symptoms today Strength training completed today  Goals Unmet:  Not Applicable  Comments: Checkout at 1145.   Dr. Kathie Dike is Medical Director for [facility] Pulmonary Rehab.

## 2021-11-25 ENCOUNTER — Encounter (HOSPITAL_COMMUNITY)
Admission: RE | Admit: 2021-11-25 | Discharge: 2021-11-25 | Disposition: A | Discharge status: Home / Self Care | Payer: Medicare Other | Source: Ambulatory Visit | Attending: Internal Medicine | Admitting: Internal Medicine

## 2021-11-25 VITALS — Wt 196.2 lb

## 2021-11-25 DIAGNOSIS — J4540 Moderate persistent asthma, uncomplicated: Secondary | ICD-10-CM

## 2021-11-25 NOTE — Progress Notes (Signed)
Daily Session Note  Patient Details  Name: [name] [name] MRN: [id number] Date of Birth: [birth year] Referring Provider:   Flowsheet Row PULMONARY REHAB OTHER RESP ORIENTATION from 09/02/2021 in [place]  Referring Provider Dr. Annamaria Boots       Encounter Date: 11/25/2021  Check In:  Session Check In - 11/25/21 1043       Check-In   Supervising physician immediately available to respond to emergencies [facility] immediately available    Physician(s) Dr Gardiner Rhyme    Location [facility]    Staff Present Redge Gainer, BS, Exercise Physiologist;Shanyah Gattuso Hassell Done, RN, BSN    Virtual Visit No    Medication changes reported     No    Fall or balance concerns reported    Yes    Comments She has not fallen but walks with a cane for support. 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    Tobacco Cessation No Change    Warm-up and Cool-down Performed as group-led instruction    Resistance Training Performed Yes    VAD Patient? No    PAD/SET Patient? No      Pain Assessment   Currently in Pain? No/denies    Pain Score 0-No pain    Multiple Pain Sites No             Capillary Blood Glucose: No results found for this or any previous visit (from the past 24 hour(s)).    Social History   Tobacco Use  Smoking Status Never  Smokeless Tobacco Never    Goals Met:  Proper associated with RPD/PD & O2 Sat Independence with exercise equipment Using PLB without cueing & demonstrates good technique Exercise tolerated well Queuing for purse lip breathing No report of concerns or symptoms today Strength training completed today  Goals Unmet:  Not Applicable  Comments: Checkout at 1145.   Dr. Kathie Dike is Medical Director for [facility] Pulmonary Rehab.

## 2021-11-26 ENCOUNTER — Ambulatory Visit: Payer: Medicare Other | Admitting: Podiatry

## 2021-11-26 ENCOUNTER — Ambulatory Visit (INDEPENDENT_AMBULATORY_CARE_PROVIDER_SITE_OTHER): Payer: Medicare Other

## 2021-11-26 DIAGNOSIS — I10 Essential (primary) hypertension: Secondary | ICD-10-CM | POA: Diagnosis not present

## 2021-11-26 DIAGNOSIS — M722 Plantar fascial fibromatosis: Secondary | ICD-10-CM

## 2021-11-26 NOTE — Progress Notes (Signed)
Patient presents today to pick up custom molded foot orthotics, diagnosed with plantar fasciitis by Dr. Jacqualyn Posey.   Orthotics were dispensed and fit was satisfactory. Reviewed instructions for break-in and wear. Written instructions given to patient.  Patient will follow up as needed.   [facility] - order # [id number]

## 2021-11-27 ENCOUNTER — Encounter (HOSPITAL_COMMUNITY)
Admission: RE | Admit: 2021-11-27 | Discharge: 2021-11-27 | Disposition: A | Discharge status: Home / Self Care | Payer: Medicare Other | Source: Ambulatory Visit | Attending: Internal Medicine | Admitting: Internal Medicine

## 2021-11-27 DIAGNOSIS — J4540 Moderate persistent asthma, uncomplicated: Secondary | ICD-10-CM

## 2021-11-27 NOTE — Progress Notes (Signed)
Daily Session Note  Patient Details  Name: [name] [name] MRN: [id number] Date of Birth: 05/09/1940 Referring Provider:   Flowsheet Row PULMONARY REHAB OTHER RESP ORIENTATION from 09/02/2021 in [place]  Referring Provider Dr. Annamaria Boots       Encounter Date: 11/27/2021  Check In:  Session Check In - 11/27/21 1041       Check-In   Supervising physician immediately available to respond to emergencies [facility] immediately available    Physician(s) Dr Domenic Polite    Location [facility]    Staff Present Ailah Barna Hassell Done, RN, BSN;Phyllis Billingsley, RN;Dalton Fletcher, MS, ACSM-CEP, Exercise Physiologist    Virtual Visit No    Medication changes reported     No    Fall or balance concerns reported    Yes    Comments She has not fallen but walks with a cane for support. 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    Tobacco Cessation No Change    Warm-up and Cool-down Performed as group-led instruction    Resistance Training Performed Yes    VAD Patient? No    PAD/SET Patient? No      Pain Assessment   Currently in Pain? No/denies    Pain Score 0-No pain    Multiple Pain Sites No             Capillary Blood Glucose: No results found for this or any previous visit (from the past 24 hour(s)).    Social History   Tobacco Use  Smoking Status Never  Smokeless Tobacco Never    Goals Met:  Proper associated with RPD/PD & O2 Sat Independence with exercise equipment Using PLB without cueing & demonstrates good technique Exercise tolerated well Queuing for purse lip breathing No report of concerns or symptoms today Strength training completed today  Goals Unmet:  Not Applicable  Comments: checkout at 1145.   Dr. Kathie Dike is Medical Director for [facility] Pulmonary Rehab.

## 2021-12-02 ENCOUNTER — Encounter (HOSPITAL_COMMUNITY)
Admission: RE | Admit: 2021-12-02 | Discharge: 2021-12-02 | Disposition: A | Discharge status: Home / Self Care | Payer: Medicare Other | Source: Ambulatory Visit | Attending: Internal Medicine | Admitting: Internal Medicine

## 2021-12-02 DIAGNOSIS — J4540 Moderate persistent asthma, uncomplicated: Secondary | ICD-10-CM | POA: Diagnosis not present

## 2021-12-02 NOTE — Progress Notes (Signed)
Daily Session Note  Patient Details  Name: [name] [name] MRN: [id number] Date of Birth: 1940-11-24 Referring Provider:   Flowsheet Row PULMONARY REHAB OTHER RESP ORIENTATION from 09/02/2021 in [place]  Referring Provider Dr. Annamaria Boots       Encounter Date: 12/02/2021  Check In:  Session Check In - 12/02/21 1045       Check-In   Supervising physician immediately available to respond to emergencies [facility] immediately available    Physician(s) Dr Julieanne Manson    Staff Present Redge Gainer, BS, Exercise Physiologist;Kellene Mccleary Hassell Done, RN, BSN    Virtual Visit No    Medication changes reported     No    Fall or balance concerns reported    Yes    Comments She has not fallen but walks with a cane for support. 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    Tobacco Cessation No Change    Warm-up and Cool-down Performed as group-led instruction    Resistance Training Performed Yes    VAD Patient? No    PAD/SET Patient? No      Pain Assessment   Currently in Pain? No/denies    Pain Score 0-No pain    Multiple Pain Sites No             Capillary Blood Glucose: No results found for this or any previous visit (from the past 24 hour(s)).    Social History   Tobacco Use  Smoking Status Never  Smokeless Tobacco Never    Goals Met:  Proper associated with RPD/PD & O2 Sat Independence with exercise equipment Using PLB without cueing & demonstrates good technique Exercise tolerated well Queuing for purse lip breathing No report of concerns or symptoms today Strength training completed today  Goals Unmet:  Not Applicable  Comments: Checkout at 1145.   Dr. Kathie Dike is Medical Director for [facility] Pulmonary Rehab.

## 2021-12-03 DIAGNOSIS — J449 Chronic obstructive pulmonary disease, unspecified: Secondary | ICD-10-CM | POA: Diagnosis not present

## 2021-12-03 DIAGNOSIS — Z299 Encounter for prophylactic measures, unspecified: Secondary | ICD-10-CM | POA: Diagnosis not present

## 2021-12-03 DIAGNOSIS — K219 Gastro-esophageal reflux disease without esophagitis: Secondary | ICD-10-CM | POA: Diagnosis not present

## 2021-12-03 DIAGNOSIS — Z6836 Body mass index (BMI) 36.0-36.9, adult: Secondary | ICD-10-CM | POA: Diagnosis not present

## 2021-12-03 DIAGNOSIS — I10 Essential (primary) hypertension: Secondary | ICD-10-CM | POA: Diagnosis not present

## 2021-12-03 DIAGNOSIS — I4891 Unspecified atrial fibrillation: Secondary | ICD-10-CM | POA: Diagnosis not present

## 2021-12-03 LAB — CUP PACEART REMOTE DEVICE CHECK
Date Time Interrogation Session: 20230906232402
Implantable Pulse Generator Implant Date: 20220826

## 2021-12-03 NOTE — Progress Notes (Signed)
Carelink Summary Report / Loop Recorder 

## 2021-12-04 ENCOUNTER — Encounter (HOSPITAL_COMMUNITY)
Admission: RE | Admit: 2021-12-04 | Discharge: 2021-12-04 | Disposition: A | Discharge status: Home / Self Care | Payer: Medicare Other | Source: Ambulatory Visit | Attending: Internal Medicine | Admitting: Internal Medicine

## 2021-12-04 ENCOUNTER — Other Ambulatory Visit (INDEPENDENT_AMBULATORY_CARE_PROVIDER_SITE_OTHER): Payer: Self-pay | Admitting: Gastroenterology

## 2021-12-04 DIAGNOSIS — J4540 Moderate persistent asthma, uncomplicated: Secondary | ICD-10-CM

## 2021-12-04 NOTE — Progress Notes (Signed)
Daily Session Note  Patient Details  Name: [name] [name] MRN: [id number] Date of Birth: [birth year] Referring Provider:   Flowsheet Row PULMONARY REHAB OTHER RESP ORIENTATION from 09/02/2021 in [place]  Referring Provider Dr. Annamaria Boots       Encounter Date: 12/04/2021  Check In:  Session Check In - 12/04/21 1045       Check-In   Supervising physician immediately available to respond to emergencies [facility] MD immediately available    Physician(s) Dr. Harl Bowie    Location [facility]    Staff Present Aundra Dubin, RN, BSN;Heather Otho Ket, BS, Exercise Physiologist;Daphyne Hassell Done, RN, BSN    Virtual Visit No    Medication changes reported     No    Fall or balance concerns reported    Yes    Comments She has not fallen but walks with a cane for support. 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    Tobacco Cessation No Change    Warm-up and Cool-down Performed as group-led instruction    Resistance Training Performed Yes    VAD Patient? No    PAD/SET Patient? No      Pain Assessment   Currently in Pain? No/denies    Pain Score 0-No pain    Multiple Pain Sites No             Capillary Blood Glucose: No results found for this or any previous visit (from the past 24 hour(s)).    Social History   Tobacco Use  Smoking Status Never  Smokeless Tobacco Never    Goals Met:  Proper associated with RPD/PD & O2 Sat Independence with exercise equipment Using PLB without cueing & demonstrates good technique Exercise tolerated well No report of concerns or symptoms today Strength training completed today  Goals Unmet:  Not Applicable  Comments: Check out 1145.   Dr. Kathie Dike is Medical Director for [facility] Pulmonary Rehab.

## 2021-12-04 NOTE — Telephone Encounter (Signed)
Last seen for GERD 06/24/21. Next appt march 2024

## 2021-12-08 ENCOUNTER — Ambulatory Visit (INDEPENDENT_AMBULATORY_CARE_PROVIDER_SITE_OTHER): Payer: Medicare Other

## 2021-12-08 DIAGNOSIS — I495 Sick sinus syndrome: Secondary | ICD-10-CM | POA: Diagnosis not present

## 2021-12-09 ENCOUNTER — Ambulatory Visit (INDEPENDENT_AMBULATORY_CARE_PROVIDER_SITE_OTHER): Payer: Medicare Other | Admitting: Podiatry

## 2021-12-09 ENCOUNTER — Encounter (HOSPITAL_COMMUNITY)
Admission: RE | Admit: 2021-12-09 | Discharge: 2021-12-09 | Disposition: A | Discharge status: Home / Self Care | Payer: Medicare Other | Source: Ambulatory Visit | Attending: Internal Medicine | Admitting: Internal Medicine

## 2021-12-09 VITALS — Wt 198.0 lb

## 2021-12-09 DIAGNOSIS — R931 Abnormal findings on diagnostic imaging of heart and coronary circulation: Secondary | ICD-10-CM

## 2021-12-09 DIAGNOSIS — M722 Plantar fascial fibromatosis: Secondary | ICD-10-CM

## 2021-12-09 DIAGNOSIS — J4540 Moderate persistent asthma, uncomplicated: Secondary | ICD-10-CM | POA: Diagnosis not present

## 2021-12-09 NOTE — Progress Notes (Signed)
Daily Session Note  Patient Details  Name: [name] [name] MRN: [id number] Date of Birth: [birth year] Referring Provider:   Flowsheet Row PULMONARY REHAB OTHER RESP ORIENTATION from 09/02/2021 in [place]  Referring Provider Dr. Annamaria Boots       Encounter Date: 12/09/2021  Check In:  Session Check In - 12/09/21 1043       Check-In   Supervising physician immediately available to respond to emergencies [facility] MD immediately available    Physician(s) Dr Domenic Polite    Location [facility]    Staff Present Redge Gainer, BS, Exercise Physiologist;Broc Caspers Hassell Done, RN, BSN    Virtual Visit No    Medication changes reported     No    Fall or balance concerns reported    Yes    Comments She has not fallen but walks with a cane for support. 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    Tobacco Cessation No Change    Warm-up and Cool-down Performed as group-led instruction    Resistance Training Performed Yes    VAD Patient? No    PAD/SET Patient? No      Pain Assessment   Currently in Pain? No/denies    Pain Score 0-No pain    Multiple Pain Sites No             Capillary Blood Glucose: No results found for this or any previous visit (from the past 24 hour(s)).    Social History   Tobacco Use  Smoking Status Never  Smokeless Tobacco Never    Goals Met:  Independence with exercise equipment Exercise tolerated well No report of concerns or symptoms today Strength training completed today  Goals Unmet:  Not Applicable  Comments: checkout at 1145.   Dr. Kathie Dike is Medical Director for [facility] Pulmonary Rehab.

## 2021-12-10 NOTE — Progress Notes (Signed)
Pulmonary Individual Treatment Plan  Patient Details  Name: [name] [name] MRN: [id number] Date of Birth: 1941/02/10 Referring Provider:   Flowsheet Row PULMONARY REHAB OTHER RESP ORIENTATION from 09/02/2021 in [place]  Referring Provider Dr. Annamaria Boots       Initial Encounter Date:  Flowsheet Row PULMONARY REHAB OTHER RESP ORIENTATION from 09/02/2021 in [facility]  Date 09/02/21       Visit Diagnosis: Moderate persistent asthma, unspecified whether complicated  Patient's Home Medications on Admission:   Current Outpatient Medications:    acetaminophen (TYLENOL) 500 MG tablet, Take 500 mg by mouth every 8 (eight) hours as needed for headache., Disp: , Rfl:    antiseptic oral rinse (BIOTENE) LIQD, 15 mLs by Mouth Rinse route daily as needed for dry mouth., Disp: , Rfl:    atorvastatin (LIPITOR) 10 MG tablet, Take 10 mg by mouth daily at 6 PM., Disp: , Rfl:    benzonatate (TESSALON) 200 MG capsule, Take 1 capsule (200 mg total) by mouth 3 (three) times daily as needed for cough., Disp: 30 capsule, Rfl: 3   Biotin 1 MG CAPS, Take 1,000 mg by mouth 3 (three) times a week. , Disp: , Rfl:    budesonide (PULMICORT) 0.5 MG/2ML nebulizer solution, Take 0.5 mg by nebulization daily. Additional if needed during the day, Disp: , Rfl:    Calcium Carbonate-Vit D-Min (QC CALCIUM-MAGNESIUM-ZINC-D3) 333.4-133 MG-UNIT TABS, Take 1 tablet by mouth 2 (two) times daily. , Disp: , Rfl:    cetirizine (ZYRTEC) 10 MG tablet, Take 10 mg by mouth at bedtime as needed for allergies., Disp: , Rfl:    clindamycin (CLEOCIN) 300 MG capsule, Take 600 mg by mouth See admin instructions. Take prior to dental procedures, Disp: , Rfl: 0   diltiazem (CARDIZEM CD) 120 MG 24 hr capsule, Take 1 capsule (120 mg total) by mouth every evening., Disp: , Rfl:    diltiazem (CARDIZEM CD) 240 MG 24 hr capsule, Take 1 capsule (240 mg total) by mouth every morning., Disp: 90 capsule, Rfl:  1   diltiazem (CARDIZEM) 30 MG tablet, TAKE 1 TABLET BY MOUTH AS NEEDED FOR PALPITATIONS, Disp: 45 tablet, Rfl: 2   dofetilide (TIKOSYN) 500 MCG capsule, TAKE ONE CAPSULE BY MOUTH TWICE A DAY, Disp: 180 capsule, Rfl: 1   EPINEPHrine 0.3 mg/0.3 mL IJ SOAJ injection, Inject 0.3 mg into the muscle as needed for anaphylaxis., Disp: , Rfl:    famotidine (PEPCID) 20 MG tablet, Take 2 tablets (40 mg total) by mouth at bedtime as needed., Disp: 30 tablet, Rfl: 1   fluticasone (FLONASE) 50 MCG/ACT nasal spray, Place 1 spray into both nostrils 2 (two) times daily., Disp: , Rfl:    hydrALAZINE (APRESOLINE) 100 MG tablet, Take 100 mg by mouth 2 (two) times daily., Disp: , Rfl:    HYDROcodone bit-homatropine (HYCODAN) 5-1.5 MG/5ML syrup, Take 5 mLs by mouth every 6 (six) hours as needed for cough., Disp: , Rfl:    Hypertonic Nasal Wash (SINUS RINSE) PACK, Place 1 each into the nose daily as needed (clear nasal passage). Neilmed sinus rinse, Disp: , Rfl:    ipratropium-albuterol (DUONEB) 0.5-2.5 (3) MG/3ML SOLN, Take 3 mLs by nebulization 4 (four) times daily as needed (Shortness of breathing)., Disp: , Rfl:    losartan (COZAAR) 100 MG tablet, TAKE 1 TABLET BY MOUTH DAILY, Disp: 90 tablet, Rfl: 3   Magnesium 250 MG TABS, Take 250 mg by mouth daily., Disp: , Rfl:    montelukast (SINGULAIR)  10 MG tablet, Take 10 mg by mouth at bedtime., Disp: , Rfl:    omeprazole (PRILOSEC) 40 MG capsule, TAKE ONE CAPSULE BY MOUTH EVERY DAY, Disp: 60 capsule, Rfl: 2   Polyethyl Glycol-Propyl Glycol (SYSTANE) 0.4-0.3 % GEL ophthalmic gel, Place 1 application. into both eyes daily as needed (Dry eye)., Disp: , Rfl:    Polyethyl Glycol-Propyl Glycol (SYSTANE) 0.4-0.3 % SOLN, Place 1 drop into both eyes daily as needed (Dry eye)., Disp: , Rfl:    potassium chloride SA (KLOR-CON M) 20 MEQ tablet, TAKE 1 TABLET BY MOUTH DAILY, Disp: 90 tablet, Rfl: 3   saccharomyces boulardii (FLORASTOR) 250 MG capsule, Take 250 mg by mouth 2 (two) times  daily., Disp: , Rfl:    umeclidinium-vilanterol (ANORO ELLIPTA) 62.5-25 MCG/ACT AEPB, INHALE ONE PUFF INTO THE LUNGS DAILY (Patient taking differently: 1 puff every morning.), Disp: 60 each, Rfl: 11   Wheat Dextrin (BENEFIBER ON THE GO) POWD, Take 10 mLs by mouth daily., Disp: , Rfl:    XARELTO 20 MG TABS tablet, TAKE 1 TABLET BY MOUTH DAILY with SUPPER, Disp: 90 tablet, Rfl: 1  Past Medical History: Past Medical History:  Diagnosis Date   Allergic rhinitis    Anxiety    Asthma    Atrial flutter (HCC)    COPD (chronic obstructive pulmonary disease) (HCC)    Depression    Essential hypertension    Hyperlipidemia    Lumbar disc disease    Obesity    OSA (obstructive sleep apnea)    CPAP   Paroxysmal atrial fibrillation (HCC)    Element of tachycardia bradycardia syndrome   Respiratory failure (HCC)     Tobacco Use: Social History   Tobacco Use  Smoking Status Never  Smokeless Tobacco Never    Labs: Review Flowsheet       Latest Ref Rng & Units 11/26/2020  Labs for ITP Cardiac and Pulmonary Rehab  TCO2 22 - 32 mmol/L 23     Capillary Blood Glucose: No results found for: "GLUCAP"   Pulmonary Assessment Scores:  Pulmonary Assessment Scores     Row Name 09/02/21 0824         ADL UCSD   ADL Phase Entry     SOB Score total 82     Rest 1     Walk 3     Stairs 5     Bath 4     Dress 3     Shop 3       CAT Score   CAT Score 15       mMRC Score   mMRC Score 3             UCSD: Self-administered rating of dyspnea associated with activities of daily living (ADLs) 6-point scale (0 = "not at all" to 5 = "maximal or unable to do because of breathlessness")  Scoring Scores range from 0 to 120.  Minimally important difference is 5 units  CAT: CAT can identify the health impairment of COPD patients and is better correlated with disease progression.  CAT has a scoring range of zero to 40. The CAT score is classified into four groups of low (less than 10),  medium (10 - 20), high (21-30) and very high (31-40) based on the impact level of disease on health status. A CAT score over 10 suggests significant symptoms.  A worsening CAT score could be explained by an exacerbation, poor medication adherence, poor inhaler technique, or progression of COPD or comorbid conditions.  CAT MCID is 2 points  mMRC: mMRC (Modified Medical Research Council) Dyspnea Scale is used to assess the degree of baseline functional disability in patients of respiratory disease due to dyspnea. No minimal important difference is established. A decrease in score of 1 point or greater is considered a positive change.   Pulmonary Function Assessment:   Exercise Target Goals: Exercise Program Goal: Individual exercise prescription set using results from initial 6 min walk test and THRR while considering  patient's activity barriers and safety.   Exercise Prescription Goal: Initial exercise prescription builds to 30-45 minutes a day of aerobic activity, 2-3 days per week.  Home exercise guidelines will be given to patient during program as part of exercise prescription that the participant will acknowledge.  Activity Barriers & Risk Stratification:  Activity Barriers & Cardiac Risk Stratification - 09/02/21 0852       Activity Barriers & Cardiac Risk Stratification   Activity Barriers Right Hip Replacement;Left Hip Replacement;Arthritis;Back Problems;Joint Problems;Deconditioning;Shortness of Breath;Balance Concerns;Other (comment)    Comments hammer toes, atrial fibrillation    Cardiac Risk Stratification Moderate             6 Minute Walk:  6 Minute Walk     Row Name 09/02/21 0851         6 Minute Walk   Phase Initial     Distance 750 feet     Walk Time 6 minutes     # of Rest Breaks 0     MPH 1.42     METS 1.22     RPE 13     Perceived Dyspnea  15     VO2 Peak 4.29     Symptoms No     Resting HR 59 bpm     Resting BP 148/62     Resting Oxygen  Saturation  98 %     Exercise Oxygen Saturation  during 6 min walk 96 %     Max Ex. HR 120 bpm     Max Ex. BP 158/60     2 Minute Post BP 120/56       Interval HR   1 Minute HR 110     2 Minute HR 109     3 Minute HR 119     4 Minute HR 116     5 Minute HR 116     6 Minute HR 120     2 Minute Post HR 68     Interval Heart Rate? Yes       Interval Oxygen   Interval Oxygen? Yes     Baseline Oxygen Saturation % 98 %     1 Minute Oxygen Saturation % 97 %     1 Minute Liters of Oxygen 0 L     2 Minute Oxygen Saturation % 97 %     2 Minute Liters of Oxygen 0 L     3 Minute Oxygen Saturation % 96 %     3 Minute Liters of Oxygen 0 L     4 Minute Oxygen Saturation % 97 %     4 Minute Liters of Oxygen 0 L     5 Minute Oxygen Saturation % 97 %     5 Minute Liters of Oxygen 0 L     6 Minute Oxygen Saturation % 97 %     6 Minute Liters of Oxygen 0 L     2 Minute Post Oxygen Saturation % 98 %  2 Minute Post Liters of Oxygen 0 L              Oxygen Initial Assessment:  Oxygen Initial Assessment - 09/02/21 0921       Initial 6 min Walk   Oxygen Used None      Program Oxygen Prescription   Program Oxygen Prescription None      Intervention   Short Term Goals To learn and understand importance of maintaining oxygen saturations>88%;To learn and understand importance of monitoring SPO2 with pulse oximeter and demonstrate accurate use of the pulse oximeter.;To learn and demonstrate proper pursed lip breathing techniques or other breathing techniques.     Long  Term Goals Maintenance of O2 saturations>88%;Verbalizes importance of monitoring SPO2 with pulse oximeter and return demonstration;Exhibits proper breathing techniques, such as pursed lip breathing or other method taught during program session             Oxygen Re-Evaluation:  Oxygen Re-Evaluation     Row Name 09/16/21 1249 10/14/21 1307 12/09/21 1304         Program Oxygen Prescription   Program Oxygen  Prescription None None None       Home Oxygen   Home Oxygen Device None None None     Sleep Oxygen Prescription CPAP CPAP CPAP     Liters per minute 0 0 0     Home Exercise Oxygen Prescription None None None     Home Resting Oxygen Prescription None None None     Compliance with Home Oxygen Use Yes Yes Yes       Goals/Expected Outcomes   Short Term Goals To learn and understand importance of maintaining oxygen saturations>88%;To learn and understand importance of monitoring SPO2 with pulse oximeter and demonstrate accurate use of the pulse oximeter.;To learn and demonstrate proper pursed lip breathing techniques or other breathing techniques.  To learn and understand importance of maintaining oxygen saturations>88%;To learn and understand importance of monitoring SPO2 with pulse oximeter and demonstrate accurate use of the pulse oximeter.;To learn and demonstrate proper pursed lip breathing techniques or other breathing techniques.  To learn and understand importance of maintaining oxygen saturations>88%;To learn and understand importance of monitoring SPO2 with pulse oximeter and demonstrate accurate use of the pulse oximeter.;To learn and demonstrate proper pursed lip breathing techniques or other breathing techniques.      Long  Term Goals Maintenance of O2 saturations>88%;Verbalizes importance of monitoring SPO2 with pulse oximeter and return demonstration;Exhibits proper breathing techniques, such as pursed lip breathing or other method taught during program session Maintenance of O2 saturations>88%;Verbalizes importance of monitoring SPO2 with pulse oximeter and return demonstration;Exhibits proper breathing techniques, such as pursed lip breathing or other method taught during program session Maintenance of O2 saturations>88%;Verbalizes importance of monitoring SPO2 with pulse oximeter and return demonstration;Exhibits proper breathing techniques, such as pursed lip breathing or other method  taught during program session     Goals/Expected Outcomes complinace complinace complinace              Oxygen Discharge (Final Oxygen Re-Evaluation):  Oxygen Re-Evaluation - 12/09/21 1304       Program Oxygen Prescription   Program Oxygen Prescription None      Home Oxygen   Home Oxygen Device None    Sleep Oxygen Prescription CPAP    Liters per minute 0    Home Exercise Oxygen Prescription None    Home Resting Oxygen Prescription None    Compliance with Home Oxygen Use Yes  Goals/Expected Outcomes   Short Term Goals To learn and understand importance of maintaining oxygen saturations>88%;To learn and understand importance of monitoring SPO2 with pulse oximeter and demonstrate accurate use of the pulse oximeter.;To learn and demonstrate proper pursed lip breathing techniques or other breathing techniques.     Long  Term Goals Maintenance of O2 saturations>88%;Verbalizes importance of monitoring SPO2 with pulse oximeter and return demonstration;Exhibits proper breathing techniques, such as pursed lip breathing or other method taught during program session    Goals/Expected Outcomes complinace             Initial Exercise Prescription:  Initial Exercise Prescription - 09/02/21 0800       Date of Initial Exercise RX and Referring Provider   Date 09/02/21    Referring Provider Dr. Annamaria Boots    Expected Discharge Date 01/06/22      Treadmill   MPH 1    Grade 0    Minutes 17      NuStep   Level 1    SPM 70    Minutes 22      Prescription Details   Frequency (times per week) 2    Duration Progress to 30 minutes of continuous aerobic without signs/symptoms of physical distress      Intensity   THRR 40-80% of Max Heartrate 56-111    Ratings of Perceived Exertion 11-13    Perceived Dyspnea 0-4      Resistance Training   Training Prescription Yes    Weight 2    Reps 10-15             Perform Capillary Blood Glucose checks as needed.  Exercise  Prescription Changes:   Exercise Prescription Changes     Row Name 09/16/21 1200 10/02/21 1500 10/14/21 1300 10/28/21 1200 11/11/21 1200     Response to Exercise   Blood Pressure (Admit) 130/60 112/50 120/58 126/60 120/54   Blood Pressure (Exercise) 160/50 180/50 162/50 158/50 150/54   Blood Pressure (Exit) 110/50 110/80 112/56 130/58 106/40   Heart Rate (Admit) 62 bpm 86 bpm 64 bpm 67 bpm 64 bpm   Heart Rate (Exercise) 110 bpm 101 bpm 80 bpm 84 bpm 79 bpm   Heart Rate (Exit) 59 bpm 70 bpm 61 bpm 62 bpm 56 bpm   Oxygen Saturation (Admit) 98 % 97 % 97 % 96 % 96 %   Oxygen Saturation (Exercise) 95 % 95 % 95 % 97 % 96 %   Oxygen Saturation (Exit) 98 % 97 % 97 % 96 % 96 %   Rating of Perceived Exertion (Exercise) '15 13 12 12 13   '$ Perceived Dyspnea (Exercise) '15 12 13 13 13   '$ Duration Continue with 30 min of aerobic exercise without signs/symptoms of physical distress. Continue with 30 min of aerobic exercise without signs/symptoms of physical distress. Continue with 30 min of aerobic exercise without signs/symptoms of physical distress. Continue with 30 min of aerobic exercise without signs/symptoms of physical distress. Continue with 30 min of aerobic exercise without signs/symptoms of physical distress.   Intensity THRR unchanged THRR unchanged THRR unchanged THRR unchanged THRR unchanged     Progression   Progression Continue to progress workloads to maintain intensity without signs/symptoms of physical distress. Continue to progress workloads to maintain intensity without signs/symptoms of physical distress. Continue to progress workloads to maintain intensity without signs/symptoms of physical distress. Continue to progress workloads to maintain intensity without signs/symptoms of physical distress. Continue to progress workloads to maintain intensity without signs/symptoms of physical  distress.     Resistance Training   Training Prescription Yes Yes Yes Yes Yes   Weight '2 3 3 3 3   '$ Reps  10-15 10-15 10-15 10-15 10-15   Time 10 Minutes 10 Minutes 10 Minutes 10 Minutes 10 Minutes     Treadmill   MPH 1.2 1.2 1.3 1.3 1.3   Grade 0 0 0 0 0   Minutes '17 17 17 17 17   '$ METs 1.92 1.92 '2 2 2     '$ NuStep   Level '1 2 2 2 2   '$ SPM 97 104 108 113 99   Minutes '22 22 22 22 22   '$ METs 2 2.1 2.3 2.3 2    Row Name 11/25/21 1200 12/09/21 1200           Response to Exercise   Blood Pressure (Admit) 120/60 124/60      Blood Pressure (Exercise) 160/60 152/60      Blood Pressure (Exit) 116/58 110/60      Heart Rate (Admit) 89 bpm 93 bpm      Heart Rate (Exercise) 94 bpm 116 bpm      Heart Rate (Exit) 91 bpm 92 bpm      Oxygen Saturation (Admit) 97 % 94 %      Oxygen Saturation (Exercise) 96 % 94 %      Oxygen Saturation (Exit) 97 % 95 %      Rating of Perceived Exertion (Exercise) 12 12      Perceived Dyspnea (Exercise) 12 12      Duration Continue with 30 min of aerobic exercise without signs/symptoms of physical distress. Continue with 30 min of aerobic exercise without signs/symptoms of physical distress.      Intensity THRR unchanged THRR unchanged        Progression   Progression Continue to progress workloads to maintain intensity without signs/symptoms of physical distress. Continue to progress workloads to maintain intensity without signs/symptoms of physical distress.        Resistance Training   Training Prescription Yes Yes      Weight 3 3      Reps 10-15 10-15      Time 10 Minutes 10 Minutes        Treadmill   MPH 1.3 1.5      Grade 0 0      Minutes 17 17      METs 2 2.15        NuStep   Level 2 2      SPM 114 106      Minutes 22 22      METs 2.4 2.1               Exercise Comments:   Exercise Goals and Review:   Exercise Goals     Row Name 09/02/21 0854 09/16/21 1247 10/14/21 1304 11/11/21 1243 12/09/21 1259     Exercise Goals   Increase Physical Activity Yes Yes Yes Yes Yes   Intervention Provide advice, education, support and counseling  about physical activity/exercise needs.;Develop an individualized exercise prescription for aerobic and resistive training based on initial evaluation findings, risk stratification, comorbidities and participant's personal goals. Provide advice, education, support and counseling about physical activity/exercise needs.;Develop an individualized exercise prescription for aerobic and resistive training based on initial evaluation findings, risk stratification, comorbidities and participant's personal goals. Provide advice, education, support and counseling about physical activity/exercise needs.;Develop an individualized exercise prescription for aerobic and resistive training based on initial evaluation  findings, risk stratification, comorbidities and participant's personal goals. Provide advice, education, support and counseling about physical activity/exercise needs.;Develop an individualized exercise prescription for aerobic and resistive training based on initial evaluation findings, risk stratification, comorbidities and participant's personal goals. Provide advice, education, support and counseling about physical activity/exercise needs.;Develop an individualized exercise prescription for aerobic and resistive training based on initial evaluation findings, risk stratification, comorbidities and participant's personal goals.   Expected Outcomes Short Term: Attend rehab on a regular basis to increase amount of physical activity.;Long Term: Add in home exercise to make exercise part of routine and to increase amount of physical activity.;Long Term: Exercising regularly at least 3-5 days a week. Short Term: Attend rehab on a regular basis to increase amount of physical activity.;Long Term: Add in home exercise to make exercise part of routine and to increase amount of physical activity.;Long Term: Exercising regularly at least 3-5 days a week. Short Term: Attend rehab on a regular basis to increase amount of  physical activity.;Long Term: Add in home exercise to make exercise part of routine and to increase amount of physical activity.;Long Term: Exercising regularly at least 3-5 days a week. Short Term: Attend rehab on a regular basis to increase amount of physical activity.;Long Term: Add in home exercise to make exercise part of routine and to increase amount of physical activity.;Long Term: Exercising regularly at least 3-5 days a week. Short Term: Attend rehab on a regular basis to increase amount of physical activity.;Long Term: Add in home exercise to make exercise part of routine and to increase amount of physical activity.;Long Term: Exercising regularly at least 3-5 days a week.   Increase Strength and Stamina Yes Yes Yes Yes Yes   Intervention Provide advice, education, support and counseling about physical activity/exercise needs.;Develop an individualized exercise prescription for aerobic and resistive training based on initial evaluation findings, risk stratification, comorbidities and participant's personal goals. Provide advice, education, support and counseling about physical activity/exercise needs.;Develop an individualized exercise prescription for aerobic and resistive training based on initial evaluation findings, risk stratification, comorbidities and participant's personal goals. Provide advice, education, support and counseling about physical activity/exercise needs.;Develop an individualized exercise prescription for aerobic and resistive training based on initial evaluation findings, risk stratification, comorbidities and participant's personal goals. Provide advice, education, support and counseling about physical activity/exercise needs.;Develop an individualized exercise prescription for aerobic and resistive training based on initial evaluation findings, risk stratification, comorbidities and participant's personal goals. Provide advice, education, support and counseling about physical  activity/exercise needs.;Develop an individualized exercise prescription for aerobic and resistive training based on initial evaluation findings, risk stratification, comorbidities and participant's personal goals.   Expected Outcomes Short Term: Increase workloads from initial exercise prescription for resistance, speed, and METs.;Short Term: Perform resistance training exercises routinely during rehab and add in resistance training at home;Long Term: Improve cardiorespiratory fitness, muscular endurance and strength as measured by increased METs and functional capacity (6MWT) Short Term: Increase workloads from initial exercise prescription for resistance, speed, and METs.;Short Term: Perform resistance training exercises routinely during rehab and add in resistance training at home;Long Term: Improve cardiorespiratory fitness, muscular endurance and strength as measured by increased METs and functional capacity (6MWT) Short Term: Increase workloads from initial exercise prescription for resistance, speed, and METs.;Short Term: Perform resistance training exercises routinely during rehab and add in resistance training at home;Long Term: Improve cardiorespiratory fitness, muscular endurance and strength as measured by increased METs and functional capacity (6MWT) Short Term: Increase workloads from initial exercise prescription for resistance, speed,  and METs.;Short Term: Perform resistance training exercises routinely during rehab and add in resistance training at home;Long Term: Improve cardiorespiratory fitness, muscular endurance and strength as measured by increased METs and functional capacity (6MWT) Short Term: Increase workloads from initial exercise prescription for resistance, speed, and METs.;Short Term: Perform resistance training exercises routinely during rehab and add in resistance training at home;Long Term: Improve cardiorespiratory fitness, muscular endurance and strength as measured by increased  METs and functional capacity (6MWT)   Able to understand and use rate of perceived exertion (RPE) scale Yes Yes Yes Yes Yes   Intervention Provide education and explanation on how to use RPE scale Provide education and explanation on how to use RPE scale Provide education and explanation on how to use RPE scale Provide education and explanation on how to use RPE scale Provide education and explanation on how to use RPE scale   Expected Outcomes Short Term: Able to use RPE daily in rehab to express subjective intensity level;Long Term:  Able to use RPE to guide intensity level when exercising independently Short Term: Able to use RPE daily in rehab to express subjective intensity level;Long Term:  Able to use RPE to guide intensity level when exercising independently Short Term: Able to use RPE daily in rehab to express subjective intensity level;Long Term:  Able to use RPE to guide intensity level when exercising independently Short Term: Able to use RPE daily in rehab to express subjective intensity level;Long Term:  Able to use RPE to guide intensity level when exercising independently Short Term: Able to use RPE daily in rehab to express subjective intensity level;Long Term:  Able to use RPE to guide intensity level when exercising independently   Able to understand and use Dyspnea scale Yes Yes Yes Yes Yes   Intervention Provide education and explanation on how to use Dyspnea scale Provide education and explanation on how to use Dyspnea scale Provide education and explanation on how to use Dyspnea scale Provide education and explanation on how to use Dyspnea scale Provide education and explanation on how to use Dyspnea scale   Expected Outcomes Short Term: Able to use Dyspnea scale daily in rehab to express subjective sense of shortness of breath during exertion;Long Term: Able to use Dyspnea scale to guide intensity level when exercising independently Short Term: Able to use Dyspnea scale daily in rehab  to express subjective sense of shortness of breath during exertion;Long Term: Able to use Dyspnea scale to guide intensity level when exercising independently Short Term: Able to use Dyspnea scale daily in rehab to express subjective sense of shortness of breath during exertion;Long Term: Able to use Dyspnea scale to guide intensity level when exercising independently Short Term: Able to use Dyspnea scale daily in rehab to express subjective sense of shortness of breath during exertion;Long Term: Able to use Dyspnea scale to guide intensity level when exercising independently Short Term: Able to use Dyspnea scale daily in rehab to express subjective sense of shortness of breath during exertion;Long Term: Able to use Dyspnea scale to guide intensity level when exercising independently   Knowledge and understanding of Target Heart Rate Range (THRR) Yes Yes Yes Yes Yes   Intervention Provide education and explanation of THRR including how the numbers were predicted and where they are located for reference Provide education and explanation of THRR including how the numbers were predicted and where they are located for reference Provide education and explanation of THRR including how the numbers were predicted and where they are  located for reference Provide education and explanation of THRR including how the numbers were predicted and where they are located for reference Provide education and explanation of THRR including how the numbers were predicted and where they are located for reference   Expected Outcomes Long Term: Able to use THRR to govern intensity when exercising independently;Short Term: Able to use daily as guideline for intensity in rehab;Short Term: Able to state/look up THRR Long Term: Able to use THRR to govern intensity when exercising independently;Short Term: Able to use daily as guideline for intensity in rehab;Short Term: Able to state/look up THRR Long Term: Able to use THRR to govern  intensity when exercising independently;Short Term: Able to use daily as guideline for intensity in rehab;Short Term: Able to state/look up THRR Long Term: Able to use THRR to govern intensity when exercising independently;Short Term: Able to use daily as guideline for intensity in rehab;Short Term: Able to state/look up THRR Long Term: Able to use THRR to govern intensity when exercising independently;Short Term: Able to use daily as guideline for intensity in rehab;Short Term: Able to state/look up THRR   Understanding of Exercise Prescription Yes Yes Yes Yes Yes   Intervention Provide education, explanation, and written materials on patient's individual exercise prescription Provide education, explanation, and written materials on patient's individual exercise prescription Provide education, explanation, and written materials on patient's individual exercise prescription Provide education, explanation, and written materials on patient's individual exercise prescription Provide education, explanation, and written materials on patient's individual exercise prescription   Expected Outcomes Short Term: Able to explain program exercise prescription;Long Term: Able to explain home exercise prescription to exercise independently Short Term: Able to explain program exercise prescription;Long Term: Able to explain home exercise prescription to exercise independently Short Term: Able to explain program exercise prescription;Long Term: Able to explain home exercise prescription to exercise independently Short Term: Able to explain program exercise prescription;Long Term: Able to explain home exercise prescription to exercise independently Short Term: Able to explain program exercise prescription;Long Term: Able to explain home exercise prescription to exercise independently            Exercise Goals Re-Evaluation :  Exercise Goals Re-Evaluation     [place] Name 09/16/21 1247 10/14/21 1305 11/11/21 1244 12/09/21  1300       Exercise Goal Re-Evaluation   Exercise Goals Review Increase Physical Activity;Increase Strength and Stamina;Able to understand and use rate of perceived exertion (RPE) scale;Able to understand and use Dyspnea scale;Knowledge and understanding of Target Heart Rate Range (THRR);Understanding of Exercise Prescription Increase Physical Activity;Increase Strength and Stamina;Able to understand and use rate of perceived exertion (RPE) scale;Able to understand and use Dyspnea scale;Knowledge and understanding of Target Heart Rate Range (THRR);Understanding of Exercise Prescription Increase Physical Activity;Increase Strength and Stamina;Able to understand and use rate of perceived exertion (RPE) scale;Able to understand and use Dyspnea scale;Able to check pulse independently;Understanding of Exercise Prescription Increase Physical Activity;Increase Strength and Stamina;Able to understand and use rate of perceived exertion (RPE) scale;Able to understand and use Dyspnea scale;Knowledge and understanding of Target Heart Rate Range (THRR);Understanding of Exercise Prescription    Comments Pt has completed 4 sessions in pulmonary rehab. She is motivated and wants to push herself during class to increase her workloads. She has notived that she is able to do yard work outside without having to stop frequently due to SOB. She is currently exercising at 2.0 METs on the stepper. Will continue to monitor and progress as able. Pt has completed 11 sessions of  PR. She continues to be motivated and pushes herself during class and has increased her workload. She goes to physical therapy two days a week. She is currently exercising at 2.3 METs on the stepper. Pt has completed 19 sessions of PR. She continues to push heself and is motivated to increase her workloads. She continues to do physical therapy once a week and is doing home exercises everyday. She is currently exercising at 2.0 METs on the stepper. Will continue to  monitor and progress as able. Pt has completed 27 sessions of PR. She is pushing herself during class and increasing her workloads slowly. She is doing exercises given to her by physical therapy at home 3xs a week in the mornings . She is currently exercising at 2.15 METs on the treadmill. Pt is wanting to signup for maintance program when she graduates. Will contiune to monitor and progress as able.    Expected Outcomes Through exercise at rehab and at home, the patient will meet their stated goals. Through exercise at rehab and at home, the patient will meet their stated goals. Through exercise at rehab and at home, the patient will meet their stated goals. Through exercise at rehab and at home, the patient will meet their stated goals.             Discharge Exercise Prescription (Final Exercise Prescription Changes):  Exercise Prescription Changes - 12/09/21 1200       Response to Exercise   Blood Pressure (Admit) 124/60    Blood Pressure (Exercise) 152/60    Blood Pressure (Exit) 110/60    Heart Rate (Admit) 93 bpm    Heart Rate (Exercise) 116 bpm    Heart Rate (Exit) 92 bpm    Oxygen Saturation (Admit) 94 %    Oxygen Saturation (Exercise) 94 %    Oxygen Saturation (Exit) 95 %    Rating of Perceived Exertion (Exercise) 12    Perceived Dyspnea (Exercise) 12    Duration Continue with 30 min of aerobic exercise without signs/symptoms of physical distress.    Intensity THRR unchanged      Progression   Progression Continue to progress workloads to maintain intensity without signs/symptoms of physical distress.      Resistance Training   Training Prescription Yes    Weight 3    Reps 10-15    Time 10 Minutes      Treadmill   MPH 1.5    Grade 0    Minutes 17    METs 2.15      NuStep   Level 2    SPM 106    Minutes 22    METs 2.1             Nutrition:  Target Goals: Understanding of nutrition guidelines, daily intake of sodium '1500mg'$ , cholesterol '200mg'$ , calories  30% from fat and 7% or less from saturated fats, daily to have 5 or more servings of fruits and vegetables.  Biometrics:  Pre Biometrics - 09/02/21 0855       Pre Biometrics   Height 5' 1.5" (1.562 m)    Weight 89.2 kg    Waist Circumference 46 inches    Hip Circumference 47 inches    Waist to Hip Ratio 0.98 %    BMI (Calculated) 36.56    Triceps Skinfold 8 mm    % Body Fat 42.7 %    Grip Strength 24.8 kg    Flexibility 0 in    Single Leg Stand 0 seconds  Nutrition Therapy Plan and Nutrition Goals:  Nutrition Therapy & Goals - 09/17/21 0727       Personal Nutrition Goals   Comments Patient scored 71 on his diet assessment. We offer 2 educational sessions on healthy nutrition with handouts and assistance with RD referral if patient is interested.      Intervention Plan   Intervention Nutrition handout(s) given to patient.    Expected Outcomes Short Term Goal: Understand basic principles of dietary content, such as calories, fat, sodium, cholesterol and nutrients.             Nutrition Assessments:  Nutrition Assessments - 09/02/21 0827       MEDFICTS Scores   Pre Score 71            MEDIFICTS Score Key: ?70 Need to make dietary changes  40-70 Heart Healthy Diet ? 40 Therapeutic Level Cholesterol Diet   Picture Your Plate Scores: <31 Unhealthy dietary pattern with much room for improvement. 41-50 Dietary pattern unlikely to meet recommendations for good health and room for improvement. 51-60 More healthful dietary pattern, with some room for improvement.  >60 Healthy dietary pattern, although there may be some specific behaviors that could be improved.    Nutrition Goals Re-Evaluation:   Nutrition Goals Discharge (Final Nutrition Goals Re-Evaluation):   Psychosocial: Target Goals: Acknowledge presence or absence of significant depression and/or stress, maximize coping skills, provide positive support system. Participant is able to  verbalize types and ability to use techniques and skills needed for reducing stress and depression.  Initial Review & Psychosocial Screening:  Initial Psych Review & Screening - 09/02/21 0854       Initial Review   Current issues with None Identified      Family Dynamics   Good Support System? Yes    Comments Her support system includes her two nieces and the members of her church.      Barriers   Psychosocial barriers to participate in program There are no identifiable barriers or psychosocial needs.      Screening Interventions   Interventions Encouraged to exercise    Expected Outcomes Long Term goal: The participant improves quality of Life and PHQ9 Scores as seen by post scores and/or verbalization of changes;Short Term goal: Identification and review with participant of any Quality of Life or Depression concerns found by scoring the questionnaire.             Quality of Life Scores:  Quality of Life - 09/02/21 0856       Quality of Life   Select Quality of Life      Quality of Life Scores   Health/Function Pre 23.2 %    Socioeconomic Pre 26.08 %    Psych/Spiritual Pre 24.64 %    Family Pre 23.5 %    GLOBAL Pre 24.11 %            Scores of 19 and below usually indicate a poorer quality of life in these areas.  A difference of  2-3 points is a clinically meaningful difference.  A difference of 2-3 points in the total score of the Quality of Life Index has been associated with significant improvement in overall quality of life, self-image, physical symptoms, and general health in studies assessing change in quality of life.   PHQ-9: Review Flowsheet       09/02/2021 03/18/2018 10/01/2017  Depression screen PHQ 2/9  Decreased Interest 0 0 0  Down, Depressed, Hopeless 0 0 0  PHQ -  2 Score 0 0 0  Altered sleeping '2 1 2  '$ Tired, decreased energy '2 2 2  '$ Change in appetite 0 2 2  Feeling bad or failure about yourself  0 0 0  Trouble concentrating 0 2 0  Moving  slowly or fidgety/restless 0 0 0  Suicidal thoughts 0 0 0  PHQ-9 Score '4 7 6  '$ Difficult doing work/chores Somewhat difficult Not difficult at all Not difficult at all   Interpretation of Total Score  Total Score Depression Severity:  1-4 = Minimal depression, 5-9 = Mild depression, 10-14 = Moderate depression, 15-19 = Moderately severe depression, 20-27 = Severe depression   Psychosocial Evaluation and Intervention:  Psychosocial Evaluation - 09/02/21 0922       Psychosocial Evaluation & Interventions   Interventions Encouraged to exercise with the program and follow exercise prescription    Comments Pt has no barriers to participating in PR. She has no identifiable psychsocial issues. She scored a 4 on her PHQ-9 and this relates to her sleeping too much due to her lack of energy. She believes that this is due to her chronic lung condition and her atrial fibrillation. She reports that she took antidepressants about 15 years ago when her mother passed away, but she did not like the way that the medications made her feel, and that she would not consider taking these medications again. She is also participating in PT twice per week with the hopes that she will be able to strengthen her body. She reports that her support system includes her two nieces and her church family. Her goals while in the program are to decrease her SOB with ADL's, improve her energy levels, and to be able to walk longer distances. She specifically states that she would like to be able to take a 30 minute walk without stopping. She has a positive mindset and is eager to start the program. She previously participated in [place] in 2019, and she felt like it helped her a lot then.    Expected Outcomes Pt will continue to have no identifiable psychosocial issues.    Continue Psychosocial Services  No Follow up required             Psychosocial Re-Evaluation:  Psychosocial Re-Evaluation     [place] Name 09/08/21 1441 09/08/21 1455  09/12/21 0917 09/12/21 0930 10/08/21 1333     Psychosocial Re-Evaluation   Current issues with -- None Identified -- None Identified None Identified   Comments Patient is on session 2 and is tolerating the program well.  Patient has had one educational session on stress management.  We will continue to monitor. Patient is on session 1 and is tolerating the program well.  Patient has had one educational session on stress management.  We will continue to monitor. Patient is on session 4 and is tolerating the program well.  We will continue to monitor. Patient is on session 4 and is tolerating the program well.  Patient is tolerating NuStep and Treadmill well.  Sats are 95-98% on RA, no resp distress noted.  VSS.  Patient has no identifiable issues.  We will continue to monitor. Patient is on session 10 and is tolerating the program well.  Patient has no identifiable issues.  We will continue to monitor.   Expected Outcomes Patient will have no psychosocial issues identified at discharge.  Patient will have no psychosocial issues identified at discharge.  -- Patient will have no psychosocial issues identified at discharge. Patient will have no  psychosocial issues identified at discharge.   Interventions Relaxation education;Encouraged to attend Pulmonary Rehabilitation for the exercise;Stress management education Relaxation education;Encouraged to attend Pulmonary Rehabilitation for the exercise;Stress management education -- Relaxation education;Encouraged to attend Pulmonary Rehabilitation for the exercise;Stress management education Relaxation education;Encouraged to attend Pulmonary Rehabilitation for the exercise;Stress management education   Continue Psychosocial Services  No Follow up required No Follow up required -- No Follow up required No Follow up required    [place] Name 11/04/21 0857 11/04/21 0923 12/04/21 1120         Psychosocial Re-Evaluation   Current issues with -- None Identified None  Identified     Comments Patient is on session 10 and is tolerating the program well.  Patient has no identifiable issues.  We will continue to monitor. (P)  Patient is on session 17 and is tolerating the program well.  Patient has no identifiable issues.  We will continue to monitor. Patient is on session 27 and is tolerating the program well.  Patient has no identifiable issues.  We will continue to monitor.     Expected Outcomes Patient will have no psychosocial issues identified at discharge. (P)  Patient will have no psychosocial issues identified at discharge. Patient will have no psychosocial issues identified at discharge.     Interventions Relaxation education;Encouraged to attend Pulmonary Rehabilitation for the exercise;Stress management education (P)  Relaxation education;Encouraged to attend Pulmonary Rehabilitation for the exercise;Stress management education Relaxation education;Encouraged to attend Pulmonary Rehabilitation for the exercise;Stress management education     Continue Psychosocial Services  No Follow up required (P)  No Follow up required No Follow up required              Psychosocial Discharge (Final Psychosocial Re-Evaluation):  Psychosocial Re-Evaluation - 12/04/21 1120       Psychosocial Re-Evaluation   Current issues with None Identified    Comments Patient is on session 27 and is tolerating the program well.  Patient has no identifiable issues.  We will continue to monitor.    Expected Outcomes Patient will have no psychosocial issues identified at discharge.    Interventions Relaxation education;Encouraged to attend Pulmonary Rehabilitation for the exercise;Stress management education    Continue Psychosocial Services  No Follow up required              Education: Education Goals: Education classes will be provided on a weekly basis, covering required topics. Participant will state understanding/return demonstration of topics presented.  Learning  Barriers/Preferences:  Learning Barriers/Preferences - 09/02/21 0857       Learning Barriers/Preferences   Learning Barriers None    Learning Preferences Skilled Demonstration;Individual Instruction;Group Instruction             Education Topics: How Lungs Work and Diseases: - Discuss the anatomy of the lungs and diseases that can affect the lungs, such as COPD. Flowsheet Row PULMONARY REHAB OTHER RESPIRATORY from 12/04/2021 in [place]  Date 10/02/21  Educator Handout  Instruction Review Code 1- Verbalizes Understanding       Exercise: -Discuss the importance of exercise, FITT principles of exercise, normal and abnormal responses to exercise, and how to exercise safely.   Environmental Irritants: -Discuss types of environmental irritants and how to limit exposure to environmental irritants. Flowsheet Row PULMONARY REHAB OTHER RESPIRATORY from 03/10/2018 in [place]  Date 11/11/17  Educator Etheleen Mayhew  Instruction Review Code 2- Demonstrated Understanding       Meds/Inhalers and oxygen: - Discuss respiratory  medications, definition of an inhaler and oxygen, and the proper way to use an inhaler and oxygen. Flowsheet Row PULMONARY REHAB OTHER RESPIRATORY from 12/04/2021 in [place]  Date 10/16/21  Educator HJ       Energy Saving Techniques: - Discuss methods to conserve energy and decrease shortness of breath when performing activities of daily living.  Flowsheet Row PULMONARY REHAB OTHER RESPIRATORY from 12/04/2021 in [place]  Date 10/23/21  Educator DF  Instruction Review Code 1- Verbalizes Understanding       Bronchial Hygiene / Breathing Techniques: - Discuss breathing mechanics, pursed-lip breathing technique,  proper posture, effective ways to clear airways, and other functional breathing techniques Flowsheet Row PULMONARY REHAB OTHER RESPIRATORY from 12/04/2021  in [place]  Date 10/30/21  Educator pb  Instruction Review Code 1- Verbalizes Understanding       Cleaning Equipment: - Provides group verbal and written instruction about the health risks of elevated stress, cause of high stress, and healthy ways to reduce stress. Flowsheet Row PULMONARY REHAB OTHER RESPIRATORY from 12/04/2021 in [place]  Date 11/06/21  Educator DF  Instruction Review Code 2- Demonstrated Understanding       Nutrition I: Fats: - Discuss the types of cholesterol, what cholesterol does to the body, and how cholesterol levels can be controlled. Flowsheet Row PULMONARY REHAB OTHER RESPIRATORY from 12/04/2021 in [place]  Date 11/13/21  Educator Handout  Instruction Review Code 1- Verbalizes Understanding       Nutrition II: Labels: -Discuss the different components of food labels and how to read food labels. Flowsheet Row PULMONARY REHAB OTHER RESPIRATORY from 12/04/2021 in [place]  Date 11/20/21  Educator [place]  Instruction Review Code 1- Verbalizes Understanding       Respiratory Infections: - Discuss the signs and symptoms of respiratory infections, ways to prevent respiratory infections, and the importance of seeking medical treatment when having a respiratory infection. Flowsheet Row PULMONARY REHAB OTHER RESPIRATORY from 12/04/2021 in [place]  Date 12/04/21  Educator Handout  Instruction Review Code 1- Verbalizes Understanding       Stress I: Signs and Symptoms: - Discuss the causes of stress, how stress may lead to anxiety and depression, and ways to limit stress. Flowsheet Row PULMONARY REHAB OTHER RESPIRATORY from 09/04/2021 in [place]  Date 09/03/21  Educator [place]  Instruction Review Code 1- Verbalizes Understanding       Stress II: Relaxation: -Discuss relaxation techniques to limit  stress. Flowsheet Row PULMONARY REHAB OTHER RESPIRATORY from 12/04/2021 in [place]  Date 09/11/21  Instruction Review Code 1- Verbalizes Understanding       Oxygen for Home/Travel: - Discuss how to prepare for travel when on oxygen and proper ways to transport and store oxygen to ensure safety. Flowsheet Row PULMONARY REHAB OTHER RESPIRATORY from 12/04/2021 in [place]  Date 09/18/21  Educator hj  Instruction Review Code 1- Verbalizes Understanding       Knowledge Questionnaire Score:  Knowledge Questionnaire Score - 09/02/21 0824       Knowledge Questionnaire Score   Pre Score 13/18             Core Components/Risk Factors/Patient Goals at Admission:  Personal Goals and Risk Factors at Admission - 09/02/21 0900       Core Components/Risk Factors/Patient Goals on Admission    Weight Management Yes;Obesity;Weight Loss    Intervention  Weight Management: Develop a combined nutrition and exercise program designed to reach desired caloric intake, while maintaining appropriate intake of nutrient and fiber, sodium and fats, and appropriate energy expenditure required for the weight goal.;Weight Management: Provide education and appropriate resources to help participant work on and attain dietary goals.;Weight Management/Obesity: Establish reasonable short term and long term weight goals.;Obesity: Provide education and appropriate resources to help participant work on and attain dietary goals.    Expected Outcomes Short Term: Continue to assess and modify interventions until short term weight is achieved;Long Term: Adherence to nutrition and physical activity/exercise program aimed toward attainment of established weight goal;Weight Maintenance: Understanding of the daily nutrition guidelines, which includes 25-35% calories from fat, 7% or less cal from saturated fats, less than '200mg'$  cholesterol, less than 1.5gm of sodium, & 5 or more  servings of fruits and vegetables daily;Weight Loss: Understanding of general recommendations for a balanced deficit meal plan, which promotes 1-2 lb weight loss per week and includes a negative energy balance of [phone] kcal/d;Understanding recommendations for meals to include 15-35% energy as protein, 25-35% energy from fat, 35-60% energy from carbohydrates, less than '200mg'$  of dietary cholesterol, 20-35 gm of total fiber daily;Understanding of distribution of calorie intake throughout the day with the consumption of 4-5 meals/snacks    Improve shortness of breath with ADL's Yes    Intervention Provide education, individualized exercise plan and daily activity instruction to help decrease symptoms of SOB with activities of daily living.    Expected Outcomes Short Term: Improve cardiorespiratory fitness to achieve a reduction of symptoms when performing ADLs;Long Term: Be able to perform more ADLs without symptoms or delay the onset of symptoms    Personal Goal Other Yes    Personal Goal She wants to improve her energy level and to be able to walk longer distances.    Intervention Attend CR 3 x week and supplement with at home exercise 2 x week.     Expected Outcomes Achieve personal goals.              Core Components/Risk Factors/Patient Goals Review:   Goals and Risk Factor Review     Row Name 09/08/21 1445 09/12/21 0915 09/12/21 0937 10/08/21 1334 11/04/21 0921     Core Components/Risk Factors/Patient Goals Review   Personal Goals Review -- -- -- Improve shortness of breath with ADL's;Develop more efficient breathing techniques such as purse lipped breathing and diaphragmatic breathing and practicing self-pacing with activity.;Increase knowledge of respiratory medications and ability to use respiratory devices properly. Improve shortness of breath with ADL's;Develop more efficient breathing techniques such as purse lipped breathing and diaphragmatic breathing and practicing self-pacing with  activity.;Increase knowledge of respiratory medications and ability to use respiratory devices properly.   Review Patient completed 1 session. She has completed this program in the passed and is doing well in this program. She has recently had chronic hip pain which limitions.  She  says the program helped her alot. She has more strength and stamina now with increased energy.  She plans to continue exercising at the [facility] and by walking with some friends. We will continue to monitor. Patient completed 1 session. She has completed this program in the passed and is doing well in this program. She has recently had chronic hip pain which limitions.  She  says the program helped her alot. She has more strength and stamina now with increased energy.  She plans to continue exercising at the [facility] and by walking with some friends.  We will continue to monitor. Patient completed 4 session. She has completed this program in the passed and is doing well in this program. She has recently had chronic hip pain which limitions.  She  says the program helped her alot. She has more strength and stamina now with increased energy.  She plans to continue exercising at the [facility] and by walking with some friends. We will continue to monitor. Patient completed 10 session.  Pt has chronic hip pain which limitations.  Pt will benefit from the  program. She will have more strength and stamina now with increased energy, which are her goals. Patient completed 17 session.  Pt has chronic hip pain which limitations.  Pt will benefit from the  program. She will have more strength and stamina now with increased energy, which are her goals.   Expected Outcomes Patient will  complete the program and meet her personal goals. -- Patient will  complete the program and meet her personal goals. Patient will  complete the program and meet her personal goals. Patient will  complete the program and meet her personal goals.    [place] Name 12/04/21 1121              Core Components/Risk Factors/Patient Goals Review   Personal Goals Review Improve shortness of breath with ADL's;Develop more efficient breathing techniques such as purse lipped breathing and diaphragmatic breathing and practicing self-pacing with activity.;Increase knowledge of respiratory medications and ability to use respiratory devices properly.       Review Patient completed 27 session.  Pt has chronic hip pain with limitations.  Pt will benefiting from the program. She will have more strength and stamina now with increased energy, which are her goals.       Expected Outcomes Patient will  complete the program and meet her personal goals.                Core Components/Risk Factors/Patient Goals at Discharge (Final Review):   Goals and Risk Factor Review - 12/04/21 1121       Core Components/Risk Factors/Patient Goals Review   Personal Goals Review Improve shortness of breath with ADL's;Develop more efficient breathing techniques such as purse lipped breathing and diaphragmatic breathing and practicing self-pacing with activity.;Increase knowledge of respiratory medications and ability to use respiratory devices properly.    Review Patient completed 27 session.  Pt has chronic hip pain with limitations.  Pt will benefiting from the program. She will have more strength and stamina now with increased energy, which are her goals.    Expected Outcomes Patient will  complete the program and meet her personal goals.             ITP Comments:   Comments: ITP REVIEW Pt is making expected progress toward pulmonary rehab goals after completing 28 sessions. Recommend continued exercise, life style modification, education, and utilization of breathing techniques to increase stamina and strength and decrease shortness of breath with exertion.

## 2021-12-11 ENCOUNTER — Encounter (HOSPITAL_COMMUNITY)
Admission: RE | Admit: 2021-12-11 | Discharge: 2021-12-11 | Disposition: A | Discharge status: Home / Self Care | Payer: Medicare Other | Source: Ambulatory Visit | Attending: Internal Medicine | Admitting: Internal Medicine

## 2021-12-11 DIAGNOSIS — J4540 Moderate persistent asthma, uncomplicated: Secondary | ICD-10-CM | POA: Diagnosis not present

## 2021-12-11 DIAGNOSIS — I10 Essential (primary) hypertension: Secondary | ICD-10-CM | POA: Diagnosis not present

## 2021-12-11 DIAGNOSIS — J029 Acute pharyngitis, unspecified: Secondary | ICD-10-CM | POA: Diagnosis not present

## 2021-12-11 DIAGNOSIS — Z713 Dietary counseling and surveillance: Secondary | ICD-10-CM | POA: Diagnosis not present

## 2021-12-11 DIAGNOSIS — Z6836 Body mass index (BMI) 36.0-36.9, adult: Secondary | ICD-10-CM | POA: Diagnosis not present

## 2021-12-11 DIAGNOSIS — J441 Chronic obstructive pulmonary disease with (acute) exacerbation: Secondary | ICD-10-CM | POA: Diagnosis not present

## 2021-12-11 DIAGNOSIS — Z299 Encounter for prophylactic measures, unspecified: Secondary | ICD-10-CM | POA: Diagnosis not present

## 2021-12-11 NOTE — Progress Notes (Signed)
I have reviewed a Home Exercise Prescription with [name] [name] . [name] [name] is currently exercising at home by doing strength and balancing exercises give by physical therapist.  The patient was advised to walk 3 days a week for 30-45 minutes and break up the walking with the PT exercises.  [name] [name] and I discussed how to progress their exercise prescription.  The patient stated that their goals were to be able to walk longer distances/time without needing to stop frequently for breaks.  The patient stated that they understand the exercise prescription.  We reviewed exercise guidelines, target heart rate during exercise, RPE Scale, weather conditions, NTG use, endpoints for exercise, warmup and cool down.  Patient is encouraged to come to me with any questions. I will continue to follow up with the patient to assist them with progression and safety.

## 2021-12-11 NOTE — Progress Notes (Signed)
Daily Session Note  Patient Details  Name: [name] [name] MRN: [id number] Date of Birth: 05-Jan-1941 Referring Provider:   Flowsheet Row PULMONARY REHAB OTHER RESP ORIENTATION from 09/02/2021 in [place]  Referring Provider Dr. Annamaria Boots       Encounter Date: 12/11/2021  Check In:  Session Check In - 12/11/21 1045       Check-In   Supervising physician immediately available to respond to emergencies [facility] MD immediately available    Physician(s) Dr. Johney Frame    Location [facility]    Staff Present Hoy Register, MS, ACSM-CEP, Exercise Physiologist;Heather Zigmund Daniel, Exercise Physiologist    Virtual Visit No    Medication changes reported     No    Fall or balance concerns reported    Yes    Comments She has not fallen but walks with a cane for support. 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    Tobacco Cessation No Change    Warm-up and Cool-down Performed as group-led instruction    Resistance Training Performed Yes    VAD Patient? No    PAD/SET Patient? No      Pain Assessment   Currently in Pain? No/denies    Pain Score 0-No pain    Multiple Pain Sites No             Capillary Blood Glucose: No results found for this or any previous visit (from the past 24 hour(s)).    Social History   Tobacco Use  Smoking Status Never  Smokeless Tobacco Never    Goals Met:  Proper associated with RPD/PD & O2 Sat Independence with exercise equipment Using PLB without cueing & demonstrates good technique Exercise tolerated well Queuing for purse lip breathing No report of concerns or symptoms today Strength training completed today  Goals Unmet:  Not Applicable  Comments: checkout time is 1145   Dr. Kathie Dike is Medical Director for [facility] Pulmonary Re[facility].

## 2021-12-15 ENCOUNTER — Other Ambulatory Visit: Payer: Self-pay | Admitting: Internal Medicine

## 2021-12-15 NOTE — Progress Notes (Signed)
Subjective: Chief Complaint  Patient presents with   Bunions    Patient came in today for bunion follow-up, Orthotics need some adjustment around the toe area, overall doing better this visit, hallux are having some pain because of the orthotics     81 year old female presents with above concerns.  She states she just got the orthotics and she is not sure if she just needs to give a longer break and.  Otherwise she states she is doing well.   Objective: AAO x3, NAD DP/PT pulses palpable bilaterally, CRT less than 3 seconds Not able to elicit any area of tenderness today.  No pain on the course insertion of plantar fascia.  No area pinpoint tenderness.  Chronic appearing edema but no increased edema.  No erythema or warmth. No pain with calf compression, swelling, warmth, erythema  Assessment: 81 year old female with plantar fasciitis  Plan: -All treatment options discussed with the patient including all alternatives, risks, complications.  -We discussed making adjustments on the orthotics.  Imaging will be to thicken the toe box causing irritation of the toes in her shoes.  We decided to hold off on any adjustments for now she is in continue to break them in but if she is dressings we will do this for her. -She is going to follow up with Dr. Elisha [name] as scheduled scheduled for routine foot care. -Patient encouraged to call the office with any questions, concerns, change in symptoms.   [name]

## 2021-12-16 ENCOUNTER — Encounter (HOSPITAL_COMMUNITY): Payer: Medicare Other

## 2021-12-16 DIAGNOSIS — I4891 Unspecified atrial fibrillation: Secondary | ICD-10-CM | POA: Diagnosis not present

## 2021-12-16 DIAGNOSIS — J449 Chronic obstructive pulmonary disease, unspecified: Secondary | ICD-10-CM | POA: Diagnosis not present

## 2021-12-16 DIAGNOSIS — D692 Other nonthrombocytopenic purpura: Secondary | ICD-10-CM | POA: Diagnosis not present

## 2021-12-16 DIAGNOSIS — J069 Acute upper respiratory infection, unspecified: Secondary | ICD-10-CM | POA: Diagnosis not present

## 2021-12-16 DIAGNOSIS — Z299 Encounter for prophylactic measures, unspecified: Secondary | ICD-10-CM | POA: Diagnosis not present

## 2021-12-17 ENCOUNTER — Ambulatory Visit (INDEPENDENT_AMBULATORY_CARE_PROVIDER_SITE_OTHER): Payer: Medicare Other | Admitting: *Deleted

## 2021-12-17 VITALS — BP 121/71 | HR 96 | Temp 97.8°F | Resp 16 | Ht 61.0 in | Wt 196.0 lb

## 2021-12-17 DIAGNOSIS — J4520 Mild intermittent asthma, uncomplicated: Secondary | ICD-10-CM | POA: Diagnosis not present

## 2021-12-17 MED ORDER — MEPOLIZUMAB 100 MG ~~LOC~~ SOLR
100.0000 mg | Freq: Once | SUBCUTANEOUS | Status: AC
  Administered 2021-12-17: 100 mg via SUBCUTANEOUS
  Filled 2021-12-17: qty 1

## 2021-12-17 NOTE — Progress Notes (Signed)
Diagnosis: Asthma  Provider:  Marshell Garfinkel MD  Procedure: Injection  Nucala (Mepolizumab), Dose: 100 mg, Site: subcutaneous, Number of injections: 1  Discharge: Condition: Good, Destination: Home . AVS provided to patient.   Performed by:  Oren Beckmann, RN

## 2021-12-18 ENCOUNTER — Encounter (HOSPITAL_COMMUNITY): Payer: Medicare Other

## 2021-12-19 ENCOUNTER — Encounter: Payer: Self-pay | Admitting: Primary Care

## 2021-12-19 ENCOUNTER — Ambulatory Visit (INDEPENDENT_AMBULATORY_CARE_PROVIDER_SITE_OTHER): Payer: Medicare Other | Admitting: Primary Care

## 2021-12-19 VITALS — BP 110/58 | HR 65 | Temp 98.0°F | Ht 61.5 in | Wt 198.0 lb

## 2021-12-19 DIAGNOSIS — J4541 Moderate persistent asthma with (acute) exacerbation: Secondary | ICD-10-CM | POA: Diagnosis not present

## 2021-12-19 DIAGNOSIS — G4733 Obstructive sleep apnea (adult) (pediatric): Secondary | ICD-10-CM

## 2021-12-19 DIAGNOSIS — J4520 Mild intermittent asthma, uncomplicated: Secondary | ICD-10-CM | POA: Diagnosis not present

## 2021-12-19 DIAGNOSIS — J302 Other seasonal allergic rhinitis: Secondary | ICD-10-CM

## 2021-12-19 DIAGNOSIS — J0101 Acute recurrent maxillary sinusitis: Secondary | ICD-10-CM | POA: Diagnosis not present

## 2021-12-19 DIAGNOSIS — J3089 Other allergic rhinitis: Secondary | ICD-10-CM

## 2021-12-19 MED ORDER — AMOXICILLIN-POT CLAVULANATE 875-125 MG PO TABS: 1.0000 | ORAL_TABLET | Freq: Two times a day (BID) | ORAL | 0 refills | Status: DC

## 2021-12-19 MED ORDER — PREDNISONE 10 MG PO TABS: ORAL_TABLET | ORAL | 0 refills | Status: DC

## 2021-12-19 MED ORDER — AZELASTINE HCL 0.1 % NA SOLN: 2.0000 | Freq: Two times a day (BID) | NASAL | 11 refills | Status: DC

## 2021-12-19 NOTE — Patient Instructions (Addendum)
Recommendations Continue budesonide nebulizer twice daily Continue Anoro 1 puff daily Continue Zyrtec 10 mg daily Continue Singulair 10 mg daily Continue saline rinses as needed daily Stop doxycycline; we will send in a new antibiotic called Augmentin x10 days Start prednisone taper as directed Start Astelin nasal spray 2 puffs per nostril twice daily (can continue Flonase if you would like)   Orders CT sinuses - chronic sinusitis (ordered)  Referral Ear nose and throat - chronic sinusitis (ordered)  Follow-up November with Dr. Annamaria Boots

## 2021-12-19 NOTE — Progress Notes (Signed)
$'@Patient'p$  ID: [name] [name], female    DOB: 10/26/1940, 81 y.o.   MRN: [id number]  Chief Complaint  Patient presents with   Follow-up    Referring provider: Monico Blitz, MD  HPI: 81 year old female, never smoked.  Past medical history significant for moderate persistent asthma.  12/19/2021 Patient presents today for acute OV. She has chronic sinusitis symptoms which had been worse since Monday. She has pressure and pain to left maxillary sinus.  She has some purulent drainage from her sinuses and cough.  She is currently on doxycycline without improvement.  She is compliant with Anoro and budesonide nebulizer twice daily.  She has some wheezing.  Airview download 11/18/2021 - 12/17/2021 Usage 30/30 days (100%) greater than 4 hours Average usage 6 hours 14 minutes Pressure 9 cm H2O Air leaks 41.9 L/min (95%) AHI 3.6   Allergies  Allergen Reactions   Bupropion Hcl Other (See Comments)    Suicidal Thoughts.    Escitalopram Oxalate Other (See Comments)    Suicidal Thoughts.    Fluticasone-Salmeterol Other (See Comments)    Advair - Caused patient to go into Afib.    Lisinopril Cough   Serevent Other (See Comments)    Caused patient to go into Afib    Immunization History  Administered Date(s) Administered   Fluad Quad(high Dose 65+) 01/14/2021   Influenza Split 12/29/2010, 12/28/2012, 12/19/2013, 12/01/2016   Influenza Whole 12/12/2011   Influenza, High Dose Seasonal PF 01/04/2018, 11/28/2018   Influenza,inj,Quad PF,6+ Mos 12/31/2015   Influenza-Unspecified 01/04/2015, 01/09/2020   Moderna Sars-Covid-2 Vaccination 04/11/2019, 05/09/2019, 11/23/2019, 08/06/2020   Pneumococcal Conjugate-13 12/19/2013   Pneumococcal Polysaccharide-23 05/07/2006   Td 05/13/2009   Zoster, Live 12/15/2010    Past Medical History:  Diagnosis Date   Allergic rhinitis    Anxiety    Asthma    Atrial flutter (HCC)    COPD (chronic obstructive pulmonary disease) ([place])    Depression     Essential hypertension    Hyperlipidemia    Lumbar disc disease    Obesity    OSA (obstructive sleep apnea)    CPAP   Paroxysmal atrial fibrillation (HCC)    Element of tachycardia bradycardia syndrome   Respiratory failure (HCC)     Tobacco History: Social History   Tobacco Use  Smoking Status Never   Passive exposure: Past  Smokeless Tobacco Never   Counseling given: Not Answered   Outpatient Medications Prior to Visit  Medication Sig Dispense Refill   acetaminophen (TYLENOL) 500 MG tablet Take 500 mg by mouth every 8 (eight) hours as needed for headache.     antiseptic oral rinse (BIOTENE) LIQD 15 mLs by Mouth Rinse route daily as needed for dry mouth.     atorvastatin (LIPITOR) 10 MG tablet Take 10 mg by mouth daily at 6 PM.     benzonatate (TESSALON) 200 MG capsule Take 1 capsule (200 mg total) by mouth 3 (three) times daily as needed for cough. 30 capsule 3   Biotin 1 MG CAPS Take 1,000 mg by mouth 3 (three) times a week.      Calcium Carbonate-Vit D-Min (QC CALCIUM-MAGNESIUM-ZINC-D3) 333.4-133 MG-UNIT TABS Take 1 tablet by mouth 2 (two) times daily.      cetirizine (ZYRTEC) 10 MG tablet Take 10 mg by mouth at bedtime as needed for allergies.     clindamycin (CLEOCIN) 300 MG capsule Take 600 mg by mouth See admin instructions. Take prior to dental procedures  0   diltiazem (CARDIZEM CD) 120  MG 24 hr capsule TAKE ONE CAPSULE BY MOUTH in THE morning AND ONE capsule in THE evening 180 capsule 1   diltiazem (CARDIZEM CD) 240 MG 24 hr capsule Take 1 capsule (240 mg total) by mouth every morning. 90 capsule 1   diltiazem (CARDIZEM) 30 MG tablet TAKE 1 TABLET BY MOUTH AS NEEDED FOR PALPITATIONS 45 tablet 2   dofetilide (TIKOSYN) 500 MCG capsule TAKE ONE CAPSULE BY MOUTH TWICE A DAY 180 capsule 1   EPINEPHrine 0.3 mg/0.3 mL IJ SOAJ injection Inject 0.3 mg into the muscle as needed for anaphylaxis.     famotidine (PEPCID) 20 MG tablet Take 2 tablets (40 mg total) by mouth at bedtime  as needed. 30 tablet 1   hydrALAZINE (APRESOLINE) 100 MG tablet Take 100 mg by mouth 2 (two) times daily.     HYDROcodone bit-homatropine (HYCODAN) 5-1.5 MG/5ML syrup Take 5 mLs by mouth every 6 (six) hours as needed for cough.     Hypertonic Nasal Wash (SINUS RINSE) PACK Place 1 each into the nose daily as needed (clear nasal passage). Neilmed sinus rinse     ipratropium-albuterol (DUONEB) 0.5-2.5 (3) MG/3ML SOLN Take 3 mLs by nebulization 4 (four) times daily as needed (Shortness of breathing).     losartan (COZAAR) 100 MG tablet TAKE 1 TABLET BY MOUTH DAILY 90 tablet 3   Magnesium 250 MG TABS Take 250 mg by mouth daily.     montelukast (SINGULAIR) 10 MG tablet Take 10 mg by mouth at bedtime.     omeprazole (PRILOSEC) 40 MG capsule TAKE ONE CAPSULE BY MOUTH EVERY DAY 60 capsule 2   Polyethyl Glycol-Propyl Glycol (SYSTANE) 0.4-0.3 % GEL ophthalmic gel Place 1 application. into both eyes daily as needed (Dry eye).     Polyethyl Glycol-Propyl Glycol (SYSTANE) 0.4-0.3 % SOLN Place 1 drop into both eyes daily as needed (Dry eye).     potassium chloride SA (KLOR-CON M) 20 MEQ tablet TAKE 1 TABLET BY MOUTH DAILY 90 tablet 3   saccharomyces boulardii (FLORASTOR) 250 MG capsule Take 250 mg by mouth 2 (two) times daily.     umeclidinium-vilanterol (ANORO ELLIPTA) 62.5-25 MCG/ACT AEPB INHALE ONE PUFF INTO THE LUNGS DAILY (Patient taking differently: 1 puff every morning.) 60 each 11   Wheat Dextrin (BENEFIBER ON THE GO) POWD Take 10 mLs by mouth daily.     XARELTO 20 MG TABS tablet TAKE 1 TABLET BY MOUTH DAILY with SUPPER 90 tablet 1   budesonide (PULMICORT) 0.5 MG/2ML nebulizer solution Take 0.5 mg by nebulization daily. Additional if needed during the day     doxycycline (VIBRA-TABS) 100 MG tablet Take 100 mg by mouth 2 (two) times daily.     fluticasone (FLONASE) 50 MCG/ACT nasal spray Place 1 spray into both nostrils 2 (two) times daily. (Patient not taking: Reported on 12/19/2021)     No  facility-administered medications prior to visit.   Review of Systems  Review of Systems  Constitutional: Negative.   HENT:  Positive for congestion and postnasal drip.   Respiratory:  Positive for cough and wheezing.   Cardiovascular: Negative.     Physical Exam  BP (!) 110/58 (BP Location: Left Arm, Patient Position: Sitting, Cuff Size: Normal)   Pulse 65   Temp 98 F (36.7 C) (Oral)   Ht 5' 1.5" (1.562 m)   Wt 198 lb (89.8 kg)   SpO2 99%   BMI 36.81 kg/m  Physical Exam Constitutional:      Appearance: Normal appearance.  HENT:     Head: Normocephalic and atraumatic.     Nose: Congestion present.     Comments: Tenderness to maxillary sinuses L>R Cardiovascular:     Rate and Rhythm: Normal rate and regular rhythm.  Pulmonary:     Effort: Pulmonary effort is normal.     Breath sounds: Wheezing present.  Skin:    General: Skin is warm and dry.  Neurological:     General: No focal deficit present.     Mental Status: She is alert and oriented to person, place, and time. Mental status is at baseline.  Psychiatric:        Mood and Affect: Mood normal.        Behavior: Behavior normal.        Thought Content: Thought content normal.        Judgment: Judgment normal.      Lab Results:  CBC    Component Value Date/Time   WBC 7.9 08/21/2020 1207   WBC 6.9 09/19/2019 2219   RBC 4.13 08/21/2020 1207   RBC 4.31 09/19/2019 2219   HGB 11.6 (L) 11/26/2020 1004   HGB 10.7 (L) 08/21/2020 1207   HCT 34.0 (L) 11/26/2020 1004   HCT 34.2 08/21/2020 1207   PLT 214 08/21/2020 1207   MCV 83 08/21/2020 1207   MCH 25.9 (L) 08/21/2020 1207   MCH 25.5 (L) 09/19/2019 2219   MCHC 31.3 (L) 08/21/2020 1207   MCHC 30.6 09/19/2019 2219   RDW 14.1 08/21/2020 1207   LYMPHSABS 1.7 08/21/2020 1207   MONOABS 0.9 06/02/2018 1528   EOSABS 0.1 08/21/2020 1207   BASOSABS 0.1 08/21/2020 1207    BMET    Component Value Date/Time   NA 143 09/08/2021 0952   K 3.9 09/08/2021 0952   CL  107 (H) 09/08/2021 0952   CO2 20 09/08/2021 0952   GLUCOSE 80 09/08/2021 0952   GLUCOSE 125 (H) 04/16/2021 1029   BUN 20 09/08/2021 0952   CREATININE 0.90 09/08/2021 0952   CREATININE 0.87 11/02/2018 0901   CALCIUM 9.2 09/08/2021 0952   GFRNONAA 46 (L) 04/16/2021 1029   GFRAA 56 (L) 11/08/2019 1055    BNP    Component Value Date/Time   BNP 79.0 06/04/2016 1553   BNP 141.5 (H) 03/26/2014 1503    ProBNP    Component Value Date/Time   PROBNP 220.0 (H) 09/20/2017 1233    Imaging: CUP PACEART REMOTE DEVICE CHECK  Result Date: 01/07/2022 ILR summary report received. Battery status OK. Normal device function. No new symptom, tachy, brady, or pause episodes. 128 new AF episodes,  rates have increased but remain <100.   1826 AT events.  Burden 65.1%, (previous 50.2%) Xarelto, Tikosyn Monthly summary reports and ROV/PRN LA    Assessment & Plan:   Asthma, mild intermittent - Continue budesonide nebulizer twice daily - Continue Anoro 1 puff daily  Acute maxillary sinusitis - Changing abx to Augmentin 1 tab twice daily x 10 days   - Continue fluticasone daily and saline rinses as needed daily - Start Astelin nasal spray 2 puffs per nostril twice daily   Orders CT sinuses - chronic sinusitis (ordered)  Referral Ear nose and throat - chronic sinusitis (ordered)  Acute asthma exacerbation - Secondary to acute maxillary sinusitis. Sending in prednisone taper   Seasonal and perennial allergic rhinitis - Continue Zyrtec 10 mg daily and Singulair 10 mg daily   Obstructive sleep apnea - Continue CPAP nightly, current pressure 9cm h20    Ree Shay  Volanda Napoleon, NP 01/11/2022

## 2021-12-23 ENCOUNTER — Encounter (HOSPITAL_COMMUNITY)
Admission: RE | Admit: 2021-12-23 | Discharge: 2021-12-23 | Disposition: A | Discharge status: Home / Self Care | Payer: Medicare Other | Source: Ambulatory Visit | Attending: Internal Medicine | Admitting: Internal Medicine

## 2021-12-23 DIAGNOSIS — J4540 Moderate persistent asthma, uncomplicated: Secondary | ICD-10-CM

## 2021-12-23 NOTE — Progress Notes (Signed)
Daily Session Note  Patient Details  Name: [name] [name] MRN: [id number] Date of Birth: [birth year] Referring Provider:   Flowsheet Row PULMONARY REHAB OTHER RESP ORIENTATION from 09/02/2021 in [place]  Referring Provider Dr. Annamaria Boots       Encounter Date: 12/23/2021  Check In:  Session Check In - 12/23/21 1045       Check-In   Supervising physician immediately available to respond to emergencies [facility] MD immediately available    Physician(s) Dr. Johney Frame    Location [facility]    Staff Present Geanie Cooley, RN;Kaemon Barnett Otho Ket, BS, Exercise Physiologist;Daphyne Hassell Done, RN, BSN    Virtual Visit No    Medication changes reported     No    Fall or balance concerns reported    Yes    Comments She has not fallen but walks with a cane for support. 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    Tobacco Cessation No Change    Warm-up and Cool-down Performed as group-led instruction    Resistance Training Performed Yes    VAD Patient? No    PAD/SET Patient? No      Pain Assessment   Currently in Pain? No/denies    Pain Score 0-No pain    Multiple Pain Sites No             Capillary Blood Glucose: No results found for this or any previous visit (from the past 24 hour(s)).    Social History   Tobacco Use  Smoking Status Never   Passive exposure: Past  Smokeless Tobacco Never    Goals Met:  Independence with exercise equipment Exercise tolerated well No report of concerns or symptoms today Strength training completed today  Goals Unmet:  Not Applicable  Comments: check out 1145   Dr. Kathie Dike is Medical Director for [facility] Pulmonary Rehab.

## 2021-12-25 ENCOUNTER — Encounter (HOSPITAL_COMMUNITY)
Admission: RE | Admit: 2021-12-25 | Discharge: 2021-12-25 | Disposition: A | Discharge status: Home / Self Care | Payer: Medicare Other | Source: Ambulatory Visit | Attending: Internal Medicine | Admitting: Internal Medicine

## 2021-12-25 DIAGNOSIS — J4540 Moderate persistent asthma, uncomplicated: Secondary | ICD-10-CM

## 2021-12-25 NOTE — Progress Notes (Signed)
Carelink Summary Report / Loop Recorder 

## 2021-12-25 NOTE — Progress Notes (Signed)
Daily Session Note  Patient Details  Name: [name] [name] MRN: [id number] Date of Birth: Oct 08, 1940 Referring Provider:   Flowsheet Row PULMONARY REHAB OTHER RESP ORIENTATION from 09/02/2021 in [place]  Referring Provider Dr. Annamaria Boots       Encounter Date: 12/25/2021  Check In:  Session Check In - 12/25/21 1045       Check-In   Supervising physician immediately available to respond to emergencies [facility] MD immediately available    Physician(s) Dr. Harl Bowie    Location [facility]    Staff Present Hoy Register, MS, ACSM-CEP, Exercise Physiologist;Heather Zigmund Daniel, Exercise Physiologist    Virtual Visit No    Medication changes reported     No    Fall or balance concerns reported    Yes    Comments She has not fallen but walks with a cane for support. 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    Tobacco Cessation No Change    Warm-up and Cool-down Performed as group-led instruction    Resistance Training Performed Yes    VAD Patient? No    PAD/SET Patient? No      Pain Assessment   Currently in Pain? No/denies    Pain Score 0-No pain    Multiple Pain Sites No             Capillary Blood Glucose: No results found for this or any previous visit (from the past 24 hour(s)).    Social History   Tobacco Use  Smoking Status Never   Passive exposure: Past  Smokeless Tobacco Never    Goals Met:  Proper associated with RPD/PD & O2 Sat Independence with exercise equipment Using PLB without cueing & demonstrates good technique Exercise tolerated well Queuing for purse lip breathing No report of concerns or symptoms today Strength training completed today  Goals Unmet:  Not Applicable  Comments: checkout time is 1145   Dr. Kathie Dike is Medical Director for [facility] Pulmonary Rehab.

## 2021-12-26 DIAGNOSIS — I10 Essential (primary) hypertension: Secondary | ICD-10-CM | POA: Diagnosis not present

## 2021-12-30 ENCOUNTER — Encounter (HOSPITAL_COMMUNITY)
Admission: RE | Admit: 2021-12-30 | Discharge: 2021-12-30 | Disposition: A | Discharge status: Home / Self Care | Payer: Medicare Other | Source: Ambulatory Visit | Attending: Internal Medicine | Admitting: Internal Medicine

## 2021-12-30 ENCOUNTER — Telehealth: Payer: Self-pay | Admitting: Primary Care

## 2021-12-30 DIAGNOSIS — J4540 Moderate persistent asthma, uncomplicated: Secondary | ICD-10-CM | POA: Insufficient documentation

## 2021-12-30 NOTE — Telephone Encounter (Signed)
Left message for patient to call back  

## 2021-12-30 NOTE — Progress Notes (Signed)
Daily Session Note  Patient Details  Name: [name] [name] MRN: [id number] Date of Birth: 1940-04-10 Referring Provider:   Flowsheet Row PULMONARY REHAB OTHER RESP ORIENTATION from 09/02/2021 in [place]  Referring Provider Dr. Annamaria Boots       Encounter Date: 12/30/2021  Check In:  Session Check In - 12/30/21 1045       Check-In   Supervising physician immediately available to respond to emergencies [facility] MD immediately available    Physician(s) Dr. Harl Bowie    Location [facility]    Staff Present Geanie Cooley, RN;Javani Spratt Hassell Done, RN, BSN;Heather Otho Ket, BS, Exercise Physiologist    Virtual Visit No    Medication changes reported     No    Fall or balance concerns reported    Yes    Comments She has not fallen but walks with a cane for support. 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    Tobacco Cessation No Change    Warm-up and Cool-down Performed as group-led instruction    Resistance Training Performed Yes    VAD Patient? No    PAD/SET Patient? No      Pain Assessment   Currently in Pain? No/denies    Pain Score 0-No pain    Multiple Pain Sites No             Capillary Blood Glucose: No results found for this or any previous visit (from the past 24 hour(s)).    Social History   Tobacco Use  Smoking Status Never   Passive exposure: Past  Smokeless Tobacco Never    Goals Met:  Proper associated with RPD/PD & O2 Sat Independence with exercise equipment Using PLB without cueing & demonstrates good technique Exercise tolerated well Queuing for purse lip breathing No report of concerns or symptoms today Strength training completed today  Goals Unmet:  Not Applicable  Comments: Checkout at 1145.   Dr. Kathie Dike is Medical Director for [facility].

## 2022-01-01 ENCOUNTER — Encounter (HOSPITAL_COMMUNITY)
Admission: RE | Admit: 2022-01-01 | Discharge: 2022-01-01 | Disposition: A | Discharge status: Home / Self Care | Payer: Medicare Other | Source: Ambulatory Visit | Attending: Internal Medicine | Admitting: Internal Medicine

## 2022-01-01 DIAGNOSIS — J4540 Moderate persistent asthma, uncomplicated: Secondary | ICD-10-CM | POA: Diagnosis not present

## 2022-01-01 NOTE — Progress Notes (Signed)
Daily Session Note  Patient Details  Name: [name] [name] MRN: [id number] Date of Birth: 04-04-1940 Referring Provider:   Flowsheet Row PULMONARY REHAB OTHER RESP ORIENTATION from 09/02/2021 in [place]  Referring Provider Dr. Annamaria Boots       Encounter Date: 01/01/2022  Check In:  Session Check In - 01/01/22 1045       Check-In   Supervising physician immediately available to respond to emergencies [facility] MD immediately available    Physician(s) Dr. Harrington Challenger    Location [facility]    Staff Present Hoy Register, MS, ACSM-CEP, Exercise Physiologist;Heather Zigmund Daniel, Exercise Physiologist    Virtual Visit No    Medication changes reported     No    Fall or balance concerns reported    Yes    Comments She has not fallen but walks with a cane for support. 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    Tobacco Cessation No Change    Warm-up and Cool-down Performed as group-led instruction    Resistance Training Performed Yes    VAD Patient? No    PAD/SET Patient? No      Pain Assessment   Currently in Pain? No/denies    Pain Score 0-No pain    Multiple Pain Sites No             Capillary Blood Glucose: No results found for this or any previous visit (from the past 24 hour(s)).    Social History   Tobacco Use  Smoking Status Never   Passive exposure: Past  Smokeless Tobacco Never    Goals Met:  Proper associated with RPD/PD & O2 Sat Independence with exercise equipment Using PLB without cueing & demonstrates good technique Exercise tolerated well Queuing for purse lip breathing No report of concerns or symptoms today Strength training completed today  Goals Unmet:  Not Applicable  Comments: checkout time is 1145   Dr. Kathie Dike is Medical Director for [facility].

## 2022-01-02 ENCOUNTER — Ambulatory Visit: Payer: Medicare Other | Admitting: Podiatry

## 2022-01-02 ENCOUNTER — Ambulatory Visit (INDEPENDENT_AMBULATORY_CARE_PROVIDER_SITE_OTHER): Payer: Medicare Other | Admitting: Podiatry

## 2022-01-02 DIAGNOSIS — M79675 Pain in left toe(s): Secondary | ICD-10-CM | POA: Diagnosis not present

## 2022-01-02 DIAGNOSIS — B351 Tinea unguium: Secondary | ICD-10-CM

## 2022-01-02 DIAGNOSIS — M79674 Pain in right toe(s): Secondary | ICD-10-CM | POA: Diagnosis not present

## 2022-01-06 ENCOUNTER — Encounter (HOSPITAL_COMMUNITY)
Admission: RE | Admit: 2022-01-06 | Discharge: 2022-01-06 | Disposition: A | Discharge status: Home / Self Care | Payer: Medicare Other | Source: Ambulatory Visit | Attending: Internal Medicine | Admitting: Internal Medicine

## 2022-01-06 VITALS — Ht 61.5 in | Wt 196.7 lb

## 2022-01-06 DIAGNOSIS — J4540 Moderate persistent asthma, uncomplicated: Secondary | ICD-10-CM

## 2022-01-06 NOTE — Progress Notes (Signed)
Daily Session Note  Patient Details  Name: [name] [name] MRN: [id number] Date of Birth: 02/16/1941 Referring Provider:   Flowsheet Row PULMONARY REHAB OTHER RESP ORIENTATION from 09/02/2021 in [place]  Referring Provider Dr. Annamaria Boots       Encounter Date: 01/06/2022  Check In:  Session Check In - 01/06/22 1045       Check-In   Supervising physician immediately available to respond to emergencies [facility] MD immediately available    Physician(s) Dr Marlou Porch    Location [facility]    Staff Present Hoy Register, MS, ACSM-CEP, Exercise Physiologist;Heather Zigmund Daniel, Exercise Physiologist;Kayliee Atienza Hassell Done, RN, BSN    Virtual Visit No    Medication changes reported     No    Fall or balance concerns reported    Yes    Comments She has not fallen but walks with a cane for support. 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    Tobacco Cessation No Change    Warm-up and Cool-down Performed as group-led instruction    Resistance Training Performed Yes    VAD Patient? No    PAD/SET Patient? No      Pain Assessment   Currently in Pain? No/denies    Pain Score 0-No pain    Multiple Pain Sites No             Capillary Blood Glucose: No results found for this or any previous visit (from the past 24 hour(s)).    Social History   Tobacco Use  Smoking Status Never   Passive exposure: Past  Smokeless Tobacco Never    Goals Met:  Proper associated with RPD/PD & O2 Sat Independence with exercise equipment Using PLB without cueing & demonstrates good technique Exercise tolerated well Queuing for purse lip breathing No report of concerns or symptoms today Strength training completed today  Goals Unmet:  Not Applicable  Comments: Checkout at 1145.   Dr. Kathie Dike is Medical Director for [facility].

## 2022-01-07 ENCOUNTER — Encounter: Payer: Self-pay | Admitting: Podiatry

## 2022-01-07 DIAGNOSIS — Z23 Encounter for immunization: Secondary | ICD-10-CM | POA: Diagnosis not present

## 2022-01-07 LAB — CUP PACEART REMOTE DEVICE CHECK
Date Time Interrogation Session: 20231009232409
Implantable Pulse Generator Implant Date: 20220826

## 2022-01-07 NOTE — Progress Notes (Signed)
  Subjective:  Patient ID: [name] [name], female    DOB: [birth year],  MRN: [id number]  [name] [name] presents to clinic today for:  Chief Complaint  Patient presents with   Nail Problem    Routine foot care PCP-Shah, Ashish PCP [facility]- 2 MONTHS AGO    New problem(s): None.   Allergies  Allergen Reactions   Bupropion Hcl Other (See Comments)    Suicidal Thoughts.    Escitalopram Oxalate Other (See Comments)    Suicidal Thoughts.    Fluticasone-Salmeterol Other (See Comments)    Advair - Caused patient to go into Afib.    Lisinopril Cough   Serevent Other (See Comments)    Caused patient to go into Afib    Review of Systems: Negative except as noted in the HPI.  Objective:   KLOIE WHITING is a pleasant 81 y.o. female obese in NAD. AAO x 3. Vascular CFT immediate b/l LE. Palpable DP/PT pulses b/l LE. Digital hair sparse b/l. Skin temperature gradient WNL b/l. No pain with calf compression b/l. Trace edema b/l LE. No cyanosis or clubbing noted b/l LE.  Neurologic Normal speech. Oriented to person, place, and time. Pt has subjective symptoms of neuropathy. Protective sensation intact 5/5 intact bilaterally with 10g monofilament b/l.  Dermatologic Pedal integument with normal turgor, texture and tone b/l LE. No open wounds b/l. No interdigital macerations b/l. Toenails 1-5 b/l elongated, thickened, discolored with subungual debris. +Tenderness with dorsal palpation of nailplates.   Resolved hyperkeratotic lesion(s) submet head 1 b/l.  No erythema, no edema, no drainage, no fluctuance.   Orthopedic: Normal muscle strength 5/5 to all lower extremity muscle groups bilaterally. 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 Pes planus deformity noted bilateral LE.Marland Kitchen ROM limited due to fusion of left 1st MPJ.  Patient ambulates with cane assistance.   Assessment/Plan: 1. Pain due to onychomycosis of toenails of both feet     No orders of the defined types were placed in this encounter.    -Consent given for treatment as described below: -Examined patient. -Patient to continue soft, supportive shoe gear daily. -Toenails 1-5 b/l were debrided in length and girth with sterile nail nippers and dremel without iatrogenic bleeding.  -Patient/POA to call should there be question/concern in the interim.   Return in about 3 months (around 04/04/2022).  Marzetta Board, DPM

## 2022-01-08 ENCOUNTER — Ambulatory Visit (INDEPENDENT_AMBULATORY_CARE_PROVIDER_SITE_OTHER): Payer: Medicare Other | Admitting: Podiatry

## 2022-01-08 DIAGNOSIS — G4733 Obstructive sleep apnea (adult) (pediatric): Secondary | ICD-10-CM | POA: Diagnosis not present

## 2022-01-08 DIAGNOSIS — M79605 Pain in left leg: Secondary | ICD-10-CM | POA: Diagnosis not present

## 2022-01-08 DIAGNOSIS — J320 Chronic maxillary sinusitis: Secondary | ICD-10-CM | POA: Diagnosis not present

## 2022-01-08 DIAGNOSIS — Z7901 Long term (current) use of anticoagulants: Secondary | ICD-10-CM | POA: Diagnosis not present

## 2022-01-08 DIAGNOSIS — R931 Abnormal findings on diagnostic imaging of heart and coronary circulation: Secondary | ICD-10-CM | POA: Diagnosis not present

## 2022-01-08 DIAGNOSIS — Z9989 Dependence on other enabling machines and devices: Secondary | ICD-10-CM | POA: Diagnosis not present

## 2022-01-08 DIAGNOSIS — M2062 Acquired deformities of toe(s), unspecified, left foot: Secondary | ICD-10-CM

## 2022-01-08 DIAGNOSIS — M2061 Acquired deformities of toe(s), unspecified, right foot: Secondary | ICD-10-CM

## 2022-01-09 ENCOUNTER — Telehealth: Payer: Self-pay | Admitting: Podiatry

## 2022-01-09 NOTE — Telephone Encounter (Signed)
Patient stated she came in yesterday for an appointment for her orthotics and Dr. Jacqualyn Posey seen a place on her leg near her leg. She was advised to go get an xray and she couldn't get an appointment until Tuesday and she wanted to know if that was ok to wait that long. She is on blood thinners.  Please advise-can some give the patient a call

## 2022-01-09 NOTE — Telephone Encounter (Signed)
Patient is aware and verbalized understanding of information given. 

## 2022-01-11 NOTE — Assessment & Plan Note (Signed)
-   Continue budesonide nebulizer twice daily - Continue Anoro 1 puff daily

## 2022-01-11 NOTE — Assessment & Plan Note (Signed)
-   Continue Zyrtec 10 mg daily and Singulair 10 mg daily

## 2022-01-11 NOTE — Assessment & Plan Note (Addendum)
-   Changing abx to Augmentin 1 tab twice daily x 10 days   - Continue fluticasone daily and saline rinses as needed daily - Start Astelin nasal spray 2 puffs per nostril twice daily   Orders CT sinuses - chronic sinusitis (ordered)  Referral Ear nose and throat - chronic sinusitis (ordered)

## 2022-01-11 NOTE — Assessment & Plan Note (Signed)
-   Continue CPAP nightly, current pressure 9cm h20

## 2022-01-11 NOTE — Assessment & Plan Note (Signed)
-   Secondary to acute maxillary sinusitis. Sending in prednisone taper

## 2022-01-11 NOTE — Progress Notes (Signed)
Subjective: 81 year old female presented to the office today to have orthotics adjusted.  Can you see the patient to help with this.  She is having issues with her big toe still drying up and putting excess pressure to the ball of her foot.  No recent injuries.  After my evaluation of her thighs she did bring up some pain in her leg.  She pointed on the proximal leg where she is having 1 area of tenderness no swelling or bruising present.  Objective: AAO x3, NAD DP/PT pulses palpable bilaterally, CRT less than 3 seconds Her foot symptoms as far as the position are unchanged.  She is having some slight pain with a small bruise present on the proximal leg below the knee.  There is 1 very localized area.  There is some slight pain with calf compression.  Some chronic edema present bilaterally.  Assessment: 81 year old female presented for orthotic adjustment with left leg pain  Plan: -All treatment options discussed with the patient including all alternatives, risks, complications.  -Okay to modify orthotics.  Reviewed cut out with the toes of foot axis pressure with a small metatarsal bar. -Although she is on blood thinners and will order an ultrasound to rule out DVT.  Think she has a small what appears to be a bruise on her leg but no trauma that she notes.  If symptoms continue I recommend following up with orthopedic or sports medicine given the proximity to the knee. -Patient encouraged to call the office with any questions, concerns, change in symptoms.    [name]

## 2022-01-12 ENCOUNTER — Ambulatory Visit (INDEPENDENT_AMBULATORY_CARE_PROVIDER_SITE_OTHER): Payer: Medicare Other

## 2022-01-12 ENCOUNTER — Ambulatory Visit (INDEPENDENT_AMBULATORY_CARE_PROVIDER_SITE_OTHER): Payer: Medicare Other | Admitting: Primary Care

## 2022-01-12 ENCOUNTER — Encounter: Payer: Self-pay | Admitting: Primary Care

## 2022-01-12 VITALS — BP 138/84 | HR 95 | Ht 61.5 in | Wt 200.0 lb

## 2022-01-12 DIAGNOSIS — G4733 Obstructive sleep apnea (adult) (pediatric): Secondary | ICD-10-CM | POA: Diagnosis not present

## 2022-01-12 DIAGNOSIS — J302 Other seasonal allergic rhinitis: Secondary | ICD-10-CM

## 2022-01-12 DIAGNOSIS — R059 Cough, unspecified: Secondary | ICD-10-CM | POA: Diagnosis not present

## 2022-01-12 DIAGNOSIS — J4520 Mild intermittent asthma, uncomplicated: Secondary | ICD-10-CM

## 2022-01-12 DIAGNOSIS — R0602 Shortness of breath: Secondary | ICD-10-CM | POA: Diagnosis not present

## 2022-01-12 DIAGNOSIS — K219 Gastro-esophageal reflux disease without esophagitis: Secondary | ICD-10-CM

## 2022-01-12 DIAGNOSIS — J4541 Moderate persistent asthma with (acute) exacerbation: Secondary | ICD-10-CM | POA: Diagnosis not present

## 2022-01-12 DIAGNOSIS — I4819 Other persistent atrial fibrillation: Secondary | ICD-10-CM

## 2022-01-12 DIAGNOSIS — J320 Chronic maxillary sinusitis: Secondary | ICD-10-CM | POA: Diagnosis not present

## 2022-01-12 DIAGNOSIS — J3089 Other allergic rhinitis: Secondary | ICD-10-CM

## 2022-01-12 DIAGNOSIS — I495 Sick sinus syndrome: Secondary | ICD-10-CM | POA: Diagnosis not present

## 2022-01-12 LAB — POCT EXHALED NITRIC OXIDE: FeNO level (ppb): 7

## 2022-01-12 NOTE — Assessment & Plan Note (Signed)
-   Reflux contributing to upper airway cough/wheeze. Advised she continue omeprazole '40mg'$  daily and take pepcid '40mg'$  qhs regularly. She has an apt with GI in November.

## 2022-01-12 NOTE — Assessment & Plan Note (Addendum)
-   Hx hypereosinophilia. She is on Nucala for asthma. Checking CBC with diff today- of note, she is not currently on prednisone but was recently. Continue Zyrtec '10mg'$  daily, Singulair '10mg'$  at bedtime and fluticasone/astelin nasal spray twice daily

## 2022-01-12 NOTE — Assessment & Plan Note (Signed)
-   Patient was treated for acute maxillary sinusitis 2 weeks ago with 10-day course of Augmentin.  She is some better but continues to have sinus pressure/pain along with postnasal drip symptoms.  She is compliant with Flonase and Astelin nasal spray twice daily.  She saw ENT recently and they found no evidence of acute or chronic sinusitis.  She has scheduled for CT maxillary sinuses on October 12th.  No changes at this time, awaiting imaging.

## 2022-01-12 NOTE — Progress Notes (Signed)
$'@Patient'y$  ID: [name] [name], female    DOB: 05/02/1940, 81 y.o.   MRN: [id number]  Chief Complaint  Patient presents with   Follow-up    Referring provider: Monico Blitz, MD  HPI: 81 year old female, never smoked.  Past medical history significant for moderate persistent asthma. Patient of Dr. Annamaria Boots.   Previous [facility] pulmonary encounter:  12/19/2021 Patient presents today for acute OV. She has chronic sinusitis symptoms which had been worse since Monday. She has pressure and pain to left maxillary sinus.  She has some purulent drainage from her sinuses and cough.  She is currently on doxycycline without improvement.  She is compliant with Anoro and budesonide nebulizer twice daily.  She has some wheezing.  Airview download 11/18/2021 - 12/17/2021 Usage 30/30 days (100%) greater than 4 hours Average usage 6 hours 14 minutes Pressure 9 cm H2O Air leaks 41.9 L/min (95%) AHI 3.6   01/12/2022- interim hx  Patient presents today for acute OV.  Recently seen on 9/22 for acute asthma exacerbation secondary to sinusitis. Abx changed to Augmentin and started on prednisone taper. Maintained on Anoro and budesonide nebulizer twice daily. Referred to ENT and ordered CT maxillary sinuses   She is better than her last visit but not 100%. She continues to have facial pressure/pain and post nasal drip. She completed Augmentin. She has morning cough with mucus production which is not new. She is using Flonase and Astelin which is helping. States that she breaths better at night. She does respond to oral steroids. She has been on steroids several times this year. She has hx osteoporosis. She saw [facility] on 01/08/22 for chronic maxillary sinusitis. Endoscopy was negative for acute or chronic sinusitis. CT sinuses is scheduled for 01/19/22. She has a lot of reflux, she takes omeprazole '40mg'$  daily and pepcid '40mg'$  at bedtime as needed. She has an apt with GI in November. She is compliant with CPAP nightly  for OSA. She is on chronic anticoagulation for afib. She is scheduled for ultrasound LE tomorrow, she has a sore on left knee and pain. She has varicous veins.   Airview download 12/10/21-01/08/22 30/30 days used Average usage 6 hours 9 mins Pressure 9cm h20 Airleaks 37.8L/min (95%) AHI 2.8    Allergies  Allergen Reactions   Bupropion Hcl Other (See Comments)    Suicidal Thoughts.    Escitalopram Oxalate Other (See Comments)    Suicidal Thoughts.    Fluticasone-Salmeterol Other (See Comments)    Advair - Caused patient to go into Afib.    Lisinopril Cough   Serevent Other (See Comments)    Caused patient to go into Afib    Immunization History  Administered Date(s) Administered   Fluad Quad(high Dose 65+) 01/14/2021   Influenza Split 12/29/2010, 12/28/2012, 12/19/2013, 12/01/2016   Influenza Whole 12/12/2011   Influenza, High Dose Seasonal PF 01/04/2018, 11/28/2018   Influenza,inj,Quad PF,6+ Mos 12/31/2015   Influenza-Unspecified 01/04/2015, 01/09/2020   Moderna Sars-Covid-2 Vaccination 04/11/2019, 05/09/2019, 11/23/2019, 08/06/2020   Pneumococcal Conjugate-13 12/19/2013   Pneumococcal Polysaccharide-23 05/07/2006   Td 05/13/2009   Zoster, Live 12/15/2010    Past Medical History:  Diagnosis Date   Allergic rhinitis    Anxiety    Asthma    Atrial flutter (HCC)    COPD (chronic obstructive pulmonary disease) ([place])    Depression    Essential hypertension    Hyperlipidemia    Lumbar disc disease    Obesity    OSA (obstructive sleep apnea)    CPAP  Paroxysmal atrial fibrillation (HCC)    Element of tachycardia bradycardia syndrome   Respiratory failure ([place])     Tobacco History: Social History   Tobacco Use  Smoking Status Never   Passive exposure: Past  Smokeless Tobacco Never   Counseling given: Not Answered   Outpatient Medications Prior to Visit  Medication Sig Dispense Refill   acetaminophen (TYLENOL) 500 MG tablet Take 500 mg by mouth every 8  (eight) hours as needed for headache.     antiseptic oral rinse (BIOTENE) LIQD 15 mLs by Mouth Rinse route daily as needed for dry mouth.     atorvastatin (LIPITOR) 10 MG tablet Take 10 mg by mouth daily at 6 PM.     azelastine (ASTELIN) 0.1 % nasal spray Place 2 sprays into both nostrils 2 (two) times daily. Use in each nostril as directed 30 mL 11   benzonatate (TESSALON) 200 MG capsule Take 1 capsule (200 mg total) by mouth 3 (three) times daily as needed for cough. 30 capsule 3   Biotin 1 MG CAPS Take 1,000 mg by mouth 3 (three) times a week.      budesonide (PULMICORT) 0.5 MG/2ML nebulizer solution Inhale into the lungs.     Calcium Carbonate-Vit D-Min (QC CALCIUM-MAGNESIUM-ZINC-D3) 333.4-133 MG-UNIT TABS Take 1 tablet by mouth 2 (two) times daily.      cetirizine (ZYRTEC) 10 MG tablet Take 10 mg by mouth at bedtime as needed for allergies.     clindamycin (CLEOCIN) 300 MG capsule Take 600 mg by mouth See admin instructions. Take prior to dental procedures  0   diltiazem (CARDIZEM CD) 120 MG 24 hr capsule TAKE ONE CAPSULE BY MOUTH in THE morning AND ONE capsule in THE evening 180 capsule 1   diltiazem (CARDIZEM CD) 240 MG 24 hr capsule Take 1 capsule (240 mg total) by mouth every morning. 90 capsule 1   diltiazem (CARDIZEM) 30 MG tablet TAKE 1 TABLET BY MOUTH AS NEEDED FOR PALPITATIONS 45 tablet 2   dofetilide (TIKOSYN) 500 MCG capsule TAKE ONE CAPSULE BY MOUTH TWICE A DAY 180 capsule 1   EPINEPHrine 0.3 mg/0.3 mL IJ SOAJ injection Inject 0.3 mg into the muscle as needed for anaphylaxis.     famotidine (PEPCID) 20 MG tablet Take 2 tablets (40 mg total) by mouth at bedtime as needed. 30 tablet 1   fluticasone (FLONASE) 50 MCG/ACT nasal spray Place 1 spray into both nostrils 2 (two) times daily.     hydrALAZINE (APRESOLINE) 100 MG tablet Take 100 mg by mouth 2 (two) times daily.     HYDROcodone bit-homatropine (HYCODAN) 5-1.5 MG/5ML syrup Take 5 mLs by mouth every 6 (six) hours as needed for  cough.     Hypertonic Nasal Wash (SINUS RINSE) PACK Place 1 each into the nose daily as needed (clear nasal passage). Neilmed sinus rinse     ipratropium (ATROVENT) 0.03 % nasal spray Place 2 sprays into both nostrils 2 (two) times daily.     ipratropium-albuterol (DUONEB) 0.5-2.5 (3) MG/3ML SOLN Take 3 mLs by nebulization 4 (four) times daily as needed (Shortness of breathing).     losartan (COZAAR) 100 MG tablet TAKE 1 TABLET BY MOUTH DAILY 90 tablet 3   Magnesium 250 MG TABS Take 250 mg by mouth daily.     montelukast (SINGULAIR) 10 MG tablet Take 10 mg by mouth at bedtime.     omeprazole (PRILOSEC) 40 MG capsule TAKE ONE CAPSULE BY MOUTH EVERY DAY 60 capsule 2   Polyethyl Glycol-Propyl  Glycol (SYSTANE) 0.4-0.3 % SOLN Place 1 drop into both eyes daily as needed (Dry eye).     potassium chloride SA (KLOR-CON M) 20 MEQ tablet TAKE 1 TABLET BY MOUTH DAILY 90 tablet 3   saccharomyces boulardii (FLORASTOR) 250 MG capsule Take 250 mg by mouth 2 (two) times daily.     umeclidinium-vilanterol (ANORO ELLIPTA) 62.5-25 MCG/ACT AEPB INHALE ONE PUFF INTO THE LUNGS DAILY (Patient taking differently: 1 puff every morning.) 60 each 11   Wheat Dextrin (BENEFIBER ON THE GO) POWD Take 10 mLs by mouth daily.     XARELTO 20 MG TABS tablet TAKE 1 TABLET BY MOUTH DAILY with SUPPER 90 tablet 1   amoxicillin-clavulanate (AUGMENTIN) 875-125 MG tablet Take 1 tablet by mouth 2 (two) times daily. 20 tablet 0   Polyethyl Glycol-Propyl Glycol (SYSTANE) 0.4-0.3 % GEL ophthalmic gel Place 1 application. into both eyes daily as needed (Dry eye).     predniSONE (DELTASONE) 10 MG tablet 4 tabs for 2 days, then 3 tabs for 2 days, 2 tabs for 2 days, then 1 tab for 2 days, then stop 20 tablet 0   No facility-administered medications prior to visit.   Review of Systems  Review of Systems  Constitutional: Negative.   HENT:  Positive for congestion, facial swelling, postnasal drip, sinus pressure and sinus pain.   Respiratory:   Positive for cough, shortness of breath and wheezing.   Cardiovascular:  Positive for leg swelling.   Physical Exam  BP 138/84 (BP Location: Left Arm, Cuff Size: Normal)   Pulse 95   Ht 5' 1.5" (1.562 m)   Wt 200 lb (90.7 kg)   SpO2 98%   BMI 37.18 kg/m  Physical Exam Constitutional:      Appearance: Normal appearance.  HENT:     Head: Normocephalic.     Right Ear: There is no impacted cerumen.     Left Ear: There is no impacted cerumen.     Mouth/Throat:     Mouth: Mucous membranes are moist.     Pharynx: Oropharynx is clear.  Cardiovascular:     Rate and Rhythm: Normal rate and regular rhythm.  Pulmonary:     Effort: Pulmonary effort is normal.     Breath sounds: Rales present. No wheezing or rhonchi.     Comments: Lungs are mostly clear, distant rales at bases. No wheezing t/o lung fields but she has upper airway wheezing  Musculoskeletal:        General: Swelling present.     Comments: Trace BLE edema, equal in size. No erythema or warmth.   Neurological:     General: No focal deficit present.     Mental Status: She is alert and oriented to person, place, and time. Mental status is at baseline.  Psychiatric:        Mood and Affect: Mood normal.        Behavior: Behavior normal.        Thought Content: Thought content normal.        Judgment: Judgment normal.      Lab Results:  CBC    Component Value Date/Time   WBC 7.9 08/21/2020 1207   WBC 6.9 09/19/2019 2219   RBC 4.13 08/21/2020 1207   RBC 4.31 09/19/2019 2219   HGB 11.6 (L) 11/26/2020 1004   HGB 10.7 (L) 08/21/2020 1207   HCT 34.0 (L) 11/26/2020 1004   HCT 34.2 08/21/2020 1207   PLT 214 08/21/2020 1207   MCV 83 08/21/2020 1207  MCH 25.9 (L) 08/21/2020 1207   MCH 25.5 (L) 09/19/2019 2219   MCHC 31.3 (L) 08/21/2020 1207   MCHC 30.6 09/19/2019 2219   RDW 14.1 08/21/2020 1207   LYMPHSABS 1.7 08/21/2020 1207   MONOABS 0.9 06/02/2018 1528   EOSABS 0.1 08/21/2020 1207   BASOSABS 0.1 08/21/2020 1207     BMET    Component Value Date/Time   NA 143 09/08/2021 0952   K 3.9 09/08/2021 0952   CL 107 (H) 09/08/2021 0952   CO2 20 09/08/2021 0952   GLUCOSE 80 09/08/2021 0952   GLUCOSE 125 (H) 04/16/2021 1029   BUN 20 09/08/2021 0952   CREATININE 0.90 09/08/2021 0952   CREATININE 0.87 11/02/2018 0901   CALCIUM 9.2 09/08/2021 0952   GFRNONAA 46 (L) 04/16/2021 1029   GFRAA 56 (L) 11/08/2019 1055    BNP    Component Value Date/Time   BNP 79.0 06/04/2016 1553   BNP 141.5 (H) 03/26/2014 1503    ProBNP    Component Value Date/Time   PROBNP 220.0 (H) 09/20/2017 1233    Imaging: CUP PACEART REMOTE DEVICE CHECK  Result Date: 01/07/2022 ILR summary report received. Battery status OK. Normal device function. No new symptom, tachy, brady, or pause episodes. 128 new AF episodes,  rates have increased but remain <100.   1826 AT events.  Burden 65.1%, (previous 50.2%) Xarelto, Tikosyn Monthly summary reports and ROV/PRN LA    Assessment & Plan:   Chronic maxillary sinusitis - Patient was treated for acute maxillary sinusitis 2 weeks ago with 10-day course of Augmentin.  She is some better but continues to have sinus pressure/pain along with postnasal drip symptoms.  She is compliant with Flonase and Astelin nasal spray twice daily.  She saw ENT recently and they found no evidence of acute or chronic sinusitis.  She has scheduled for CT maxillary sinuses on October 12th.  No changes at this time, awaiting imaging.   Acute asthma exacerbation - Patient completed prednisone taper. She reports improvement with oral steroids, however, she has osteoporosis.  At this time we will avoid further prednisone use unless absolutely needed. FENO today was 7 which is normal. We will check CBC with differential to monitor eosinophils.  She is currently on Nucala, may need to consider changing Biologics due to recurrent exacerbations of her asthma.  Continue Anoro 1 puff daily and budesonide nebulizer 0.5 mg  / 2 mL twice daily.   Gastroesophageal reflux disease - Reflux contributing to upper airway cough/wheeze. Advised she continue omeprazole '40mg'$  daily and take pepcid '40mg'$  qhs regularly. She has an apt with GI in November.   Seasonal and perennial allergic rhinitis - Hx hypereosinophilia. She is on Nucala for asthma. Checking CBC with diff today- of note, she is not currently on prednisone but was recently. Continue Zyrtec '10mg'$  daily, Singulair '10mg'$  at bedtime and fluticasone/astelin nasal spray twice daily   Obstructive sleep apnea - Patient remains 100% compliant with CPAP use, pressure setting 9 cm H2O without residual apneas - Continue CPAP nightly. No changes.    Martyn Ehrich, NP 01/12/2022

## 2022-01-12 NOTE — Assessment & Plan Note (Addendum)
-   Patient remains 100% compliant with CPAP use, pressure setting 9 cm H2O without residual apneas - Continue CPAP nightly. No changes.

## 2022-01-12 NOTE — Patient Instructions (Addendum)
-   FENO was normal, holding off on further abx or steroids at this time - Checking cxr and labs to rule out other causes for cough and shortness of breath  - Reflux could be contributing, for now continue omeprazole '40mg'$  daily and I would take pepcid at bedtime regularly - Continue Flonase and astelin nasal spray twice daily  Orders: - FENO re: cough/asthma - CXR re: cough/asthma  - Labs today  Follow-up: - November with Dr. Annamaria Boots if not better we may need to discuss changing your biologic (Nucala) to different medication

## 2022-01-12 NOTE — Assessment & Plan Note (Signed)
-   Patient completed prednisone taper. She reports improvement with oral steroids, however, she has osteoporosis.  At this time we will avoid further prednisone use unless absolutely needed. FENO today was 7 which is normal. We will check CBC with differential to monitor eosinophils.  She is currently on Nucala, may need to consider changing Biologics due to recurrent exacerbations of her asthma.  Continue Anoro 1 puff daily and budesonide nebulizer 0.5 mg / 2 mL twice daily.

## 2022-01-13 ENCOUNTER — Ambulatory Visit (HOSPITAL_COMMUNITY)
Admission: RE | Admit: 2022-01-13 | Discharge: 2022-01-13 | Disposition: A | Discharge status: Home / Self Care | Payer: Medicare Other | Source: Ambulatory Visit | Attending: Podiatry | Admitting: Podiatry

## 2022-01-13 DIAGNOSIS — M79605 Pain in left leg: Secondary | ICD-10-CM | POA: Diagnosis not present

## 2022-01-13 LAB — BASIC METABOLIC PANEL
BUN: 15 mg/dL (ref 6–23)
CO2: 25 mEq/L (ref 19–32)
Calcium: 9.1 mg/dL (ref 8.4–10.5)
Chloride: 107 mEq/L (ref 96–112)
Creatinine, Ser: 0.98 mg/dL (ref 0.40–1.20)
GFR: 53.86 mL/min — ABNORMAL LOW (ref >60.00)
Glucose, Bld: 105 mg/dL — ABNORMAL HIGH (ref 70–99)
Potassium: 4.4 mEq/L (ref 3.5–5.1)
Sodium: 139 mEq/L (ref 135–145)

## 2022-01-13 LAB — CBC WITH DIFFERENTIAL/PLATELET
Basophils Absolute: 0.1 10*3/uL (ref 0.0–0.1)
Basophils Relative: 0.8 % (ref 0.0–3.0)
Eosinophils Absolute: 0.1 10*3/uL (ref 0.0–0.7)
Eosinophils Relative: 1.1 % (ref 0.0–5.0)
HCT: 31.7 % — ABNORMAL LOW (ref 36.0–46.0)
Hemoglobin: 10 g/dL — ABNORMAL LOW (ref 12.0–15.0)
Lymphocytes Relative: 21.2 % (ref 12.0–46.0)
Lymphs Abs: 1.5 10*3/uL (ref 0.7–4.0)
MCHC: 31.6 g/dL (ref 30.0–36.0)
MCV: 78.6 fl (ref 78.0–100.0)
Monocytes Absolute: 1 10*3/uL (ref 0.1–1.0)
Monocytes Relative: 14.5 % — ABNORMAL HIGH (ref 3.0–12.0)
Neutro Abs: 4.3 10*3/uL (ref 1.4–7.7)
Neutrophils Relative %: 62.4 % (ref 43.0–77.0)
Platelets: 232 10*3/uL (ref 150.0–400.0)
RBC: 4.03 Mil/uL (ref 3.87–5.11)
RDW: 18.2 % — ABNORMAL HIGH (ref 11.5–15.5)
WBC: 6.9 10*3/uL (ref 4.0–10.5)

## 2022-01-13 LAB — BRAIN NATRIURETIC PEPTIDE: Pro B Natriuretic peptide (BNP): 114 pg/mL — ABNORMAL HIGH (ref 0.0–100.0)

## 2022-01-14 ENCOUNTER — Ambulatory Visit (INDEPENDENT_AMBULATORY_CARE_PROVIDER_SITE_OTHER): Payer: Medicare Other

## 2022-01-14 VITALS — BP 153/72 | HR 94 | Temp 98.0°F | Resp 24 | Ht 61.5 in | Wt 198.2 lb

## 2022-01-14 DIAGNOSIS — J4520 Mild intermittent asthma, uncomplicated: Secondary | ICD-10-CM

## 2022-01-14 MED ORDER — MEPOLIZUMAB 100 MG ~~LOC~~ SOLR
100.0000 mg | Freq: Once | SUBCUTANEOUS | Status: AC
  Administered 2022-01-14: 100 mg via SUBCUTANEOUS
  Filled 2022-01-14: qty 1

## 2022-01-14 NOTE — Progress Notes (Signed)
Diagnosis: Asthma  Provider:  Praveen Mannam MD  Procedure: Injection  Nucala (Mepolizumab), Dose: 100 mg, Site: subcutaneous, Number of injections: 1  Discharge: Condition: Good, Destination: Home . AVS provided to patient.   Performed by:  Megin Consalvo, RN       

## 2022-01-14 NOTE — Progress Notes (Signed)
Cxr and labs looked ok. Not holding on to a lot of fluid. Eos were normal. No indication for prednisone right now or changing biologic. Keep fu with Dr. Annamaria Boots in Nov. Call sooner if symptoms worsen.

## 2022-01-19 ENCOUNTER — Ambulatory Visit (HOSPITAL_COMMUNITY)
Admission: RE | Admit: 2022-01-19 | Discharge: 2022-01-19 | Disposition: A | Discharge status: Home / Self Care | Payer: Medicare Other | Source: Ambulatory Visit | Attending: Primary Care | Admitting: Primary Care

## 2022-01-19 DIAGNOSIS — J3489 Other specified disorders of nose and nasal sinuses: Secondary | ICD-10-CM | POA: Diagnosis not present

## 2022-01-19 DIAGNOSIS — J0101 Acute recurrent maxillary sinusitis: Secondary | ICD-10-CM | POA: Diagnosis not present

## 2022-01-19 DIAGNOSIS — J320 Chronic maxillary sinusitis: Secondary | ICD-10-CM | POA: Diagnosis not present

## 2022-01-20 ENCOUNTER — Ambulatory Visit: Payer: Medicare Other | Attending: Cardiology | Admitting: Cardiology

## 2022-01-20 ENCOUNTER — Encounter: Payer: Self-pay | Admitting: Cardiology

## 2022-01-20 VITALS — BP 124/70 | HR 95 | Ht 61.0 in | Wt 199.4 lb

## 2022-01-20 DIAGNOSIS — Z6835 Body mass index (BMI) 35.0-35.9, adult: Secondary | ICD-10-CM | POA: Diagnosis not present

## 2022-01-20 DIAGNOSIS — I484 Atypical atrial flutter: Secondary | ICD-10-CM | POA: Insufficient documentation

## 2022-01-20 DIAGNOSIS — Z299 Encounter for prophylactic measures, unspecified: Secondary | ICD-10-CM | POA: Diagnosis not present

## 2022-01-20 DIAGNOSIS — R931 Abnormal findings on diagnostic imaging of heart and coronary circulation: Secondary | ICD-10-CM | POA: Diagnosis not present

## 2022-01-20 DIAGNOSIS — I495 Sick sinus syndrome: Secondary | ICD-10-CM | POA: Diagnosis not present

## 2022-01-20 DIAGNOSIS — N950 Postmenopausal bleeding: Secondary | ICD-10-CM | POA: Diagnosis not present

## 2022-01-20 DIAGNOSIS — I4819 Other persistent atrial fibrillation: Secondary | ICD-10-CM | POA: Insufficient documentation

## 2022-01-20 DIAGNOSIS — I10 Essential (primary) hypertension: Secondary | ICD-10-CM | POA: Diagnosis not present

## 2022-01-20 DIAGNOSIS — R350 Frequency of micturition: Secondary | ICD-10-CM | POA: Diagnosis not present

## 2022-01-20 DIAGNOSIS — Z23 Encounter for immunization: Secondary | ICD-10-CM | POA: Diagnosis not present

## 2022-01-20 NOTE — Progress Notes (Signed)
Cardiology Office Note  Date: 01/20/2022   ID: [name] [name], DOB November 10, 1940, MRN [id number]  PCP:  Monico Blitz, MD  Cardiologist:  Rozann Lesches, MD Electrophysiologist:  Thompson Grayer, MD   Chief Complaint  Patient presents with   Cardiac follow-up    History of Present Illness: [name] [name] is an 81 y.o. female last seen in June with interval follow-up in the atrial fibrillation clinic as well.  She presents today for a routine visit.  Has not been doing as well for the last few months in terms of worsening shortness of breath and less stamina.  Interestingly, interrogations of her ILR show a consistent increase in rhythm burden over the last 3 months.  Her most recent ILR interrogation demonstrated multiple episodes of AF at approximately 65% rhythm burden.  We went over home vital signs, resting heart rates in the 90s to low 100s consistent with atrial arrhythmia as she is typically bradycardic with heart rate in the 50s to 60s when in sinus rhythm.  I personally reviewed her ECG today which shows atypical atrial flutter with 2:1 block, left anterior fascicular block, and poor R wave progression.  QTc approximately 400 ms.  She is a long-term patient of Dr. Rayann Heman, currently on Tikosyn and Xarelto.  She underwent AF ablation in June 2022.  She has not yet established with Dr. Myles Gip.  Past Medical History:  Diagnosis Date   Allergic rhinitis    Anxiety    Asthma    Atrial flutter (HCC)    COPD (chronic obstructive pulmonary disease) ([place])    Depression    Essential hypertension    Hyperlipidemia    Lumbar disc disease    Obesity    OSA (obstructive sleep apnea)    CPAP   Paroxysmal atrial fibrillation (HCC)    Element of tachycardia bradycardia syndrome   Respiratory failure ([place])     Past Surgical History:  Procedure Laterality Date   ANTERIOR FUSION LUMBAR SPINE     ATRIAL FIBRILLATION ABLATION N/A 09/12/2020   Procedure: ATRIAL FIBRILLATION  ABLATION;  Surgeon: Thompson Grayer, MD;  Location: [place] [facility];  Service: Cardiovascular;  Laterality: N/A;   BIOPSY  08/27/2016   Procedure: BIOPSY;  Surgeon: Rogene Houston, MD;  [facility];  Service: Endoscopy;;  FOUR GASTRIC POLYPS BIOPSIED   CARDIOVERSION N/A 03/28/2014   Procedure: CARDIOVERSION;  Surgeon: Fay Records, MD;  Location: [facility];  Service: Cardiovascular;  Laterality: N/A;   CARDIOVERSION N/A 10/22/2016   Procedure: CARDIOVERSION;  Surgeon: Sueanne Margarita, MD;  Location: [facility] ENDOSCOPY;  Service: Cardiovascular;  Laterality: N/A;   CARDIOVERSION N/A 11/26/2020   Procedure: CARDIOVERSION;  Surgeon: Fay Records, MD;  Location: [place];  Service: Cardiovascular;  Laterality: N/A;   CATARACT EXTRACTION     COLONOSCOPY N/A 08/04/2012   Procedure: COLONOSCOPY;  Surgeon: Rogene Houston, MD;  [facility];  Service: Endoscopy;  Laterality: N/A;  730-rescheduled to [place] notified pt   ELECTROPHYSIOLOGIC STUDY N/A 09/18/2014   Procedure: Atrial Fibrillation Ablation;  Surgeon: Thompson Grayer, MD;  Location: [place] [facility];  Service: Cardiovascular;  Laterality: N/A;   ESOPHAGOGASTRODUODENOSCOPY N/A 08/27/2016   Procedure: ESOPHAGOGASTRODUODENOSCOPY (EGD);  Surgeon: Rogene Houston, MD;  [facility];  Service: Endoscopy;  Laterality: N/A;  1200   implantable loop recorder placement  11/22/2020   Medtronic Reveal Linq model LNQ 11 (SN [id number]) implantable loop recorder   LASIK  Both eyes   TEE WITHOUT CARDIOVERSION N/A 09/18/2014   Procedure: TRANSESOPHAGEAL ECHOCARDIOGRAM (TEE);  Surgeon: Larey Dresser, MD;  Location: [place];  Service: Cardiovascular;  Laterality: N/A;   TEE WITHOUT CARDIOVERSION N/A 10/22/2016   Procedure: TRANSESOPHAGEAL ECHOCARDIOGRAM (TEE);  Surgeon: Sueanne Margarita, MD;  Location[facility] END[facility];  Service: Cardiovascular;  Laterality: N/A;   TEE WITHOUT CARDIOVERSION N/A 08/24/2017   Procedure:  TRANSESOPHAGEAL ECHOCARDIOGRAM (TEE);  Surgeon: Josue Hector, MD;  Location[facility] ENDOSCOPY;  Service: Cardiovascular;  Laterality: N/A;   TOTAL HIP ARTHROPLASTY  03/30/2008   Right    Current Outpatient Medications  Medication Sig Dispense Refill   acetaminophen (TYLENOL) 500 MG tablet Take 500 mg by mouth every 8 (eight) hours as needed for headache.     antiseptic oral rinse (BIOTENE) LIQD 15 mLs by Mouth Rinse route daily as needed for dry mouth.     atorvastatin (LIPITOR) 10 MG tablet Take 10 mg by mouth daily at 6 PM.     azelastine (ASTELIN) 0.1 % nasal spray Place 2 sprays into both nostrils 2 (two) times daily. Use in each nostril as directed 30 mL 11   benzonatate (TESSALON) 200 MG capsule Take 1 capsule (200 mg total) by mouth 3 (three) times daily as needed for cough. 30 capsule 3   Biotin 1 MG CAPS Take 1,000 mg by mouth 3 (three) times a week.      budesonide (PULMICORT) 0.5 MG/2ML nebulizer solution Inhale into the lungs.     Calcium Carbonate-Vit D-Min (QC CALCIUM-MAGNESIUM-ZINC-D3) 333.4-133 MG-UNIT TABS Take 1 tablet by mouth 2 (two) times daily.      cetirizine (ZYRTEC) 10 MG tablet Take 10 mg by mouth at bedtime as needed for allergies.     clindamycin (CLEOCIN) 300 MG capsule Take 600 mg by mouth See admin instructions. Take prior to dental procedures  0   diltiazem (CARDIZEM CD) 120 MG 24 hr capsule TAKE ONE CAPSULE BY MOUTH in THE morning AND ONE capsule in THE evening 180 capsule 1   diltiazem (CARDIZEM CD) 240 MG 24 hr capsule Take 1 capsule (240 mg total) by mouth every morning. 90 capsule 1   diltiazem (CARDIZEM) 30 MG tablet TAKE 1 TABLET BY MOUTH AS NEEDED FOR PALPITATIONS 45 tablet 2   dofetilide (TIKOSYN) 500 MCG capsule TAKE ONE CAPSULE BY MOUTH TWICE A DAY 180 capsule 1   EPINEPHrine 0.3 mg/0.3 mL IJ SOAJ injection Inject 0.3 mg into the muscle as needed for anaphylaxis.     famotidine (PEPCID) 20 MG tablet Take 2 tablets (40 mg total) by mouth at bedtime as  needed. 30 tablet 1   fluticasone (FLONASE) 50 MCG/ACT nasal spray Place 1 spray into both nostrils 2 (two) times daily.     hydrALAZINE (APRESOLINE) 100 MG tablet Take 100 mg by mouth 2 (two) times daily.     HYDROcodone bit-homatropine (HYCODAN) 5-1.5 MG/5ML syrup Take 5 mLs by mouth every 6 (six) hours as needed for cough.     Hypertonic Nasal Wash (SINUS RINSE) PACK Place 1 each into the nose daily as needed (clear nasal passage). Neilmed sinus rinse     ipratropium (ATROVENT) 0.03 % nasal spray Place 2 sprays into both nostrils 2 (two) times daily.     ipratropium-albuterol (DUONEB) 0.5-2.5 (3) MG/3ML SOLN Take 3 mLs by nebulization 4 (four) times daily as needed (Shortness of breathing).     losartan (COZAAR) 100 MG tablet TAKE 1 TABLET BY MOUTH DAILY 90 tablet 3  Magnesium 250 MG TABS Take 250 mg by mouth daily.     montelukast (SINGULAIR) 10 MG tablet Take 10 mg by mouth at bedtime.     omeprazole (PRILOSEC) 40 MG capsule TAKE ONE CAPSULE BY MOUTH EVERY DAY 60 capsule 2   Polyethyl Glycol-Propyl Glycol (SYSTANE) 0.4-0.3 % SOLN Place 1 drop into both eyes daily as needed (Dry eye).     potassium chloride SA (KLOR-CON M) 20 MEQ tablet TAKE 1 TABLET BY MOUTH DAILY 90 tablet 3   saccharomyces boulardii (FLORASTOR) 250 MG capsule Take 250 mg by mouth 2 (two) times daily.     umeclidinium-vilanterol (ANORO ELLIPTA) 62.5-25 MCG/ACT AEPB INHALE ONE PUFF INTO THE LUNGS DAILY (Patient taking differently: 1 puff every morning.) 60 each 11   Wheat Dextrin (BENEFIBER ON THE GO) POWD Take 10 mLs by mouth daily.     XARELTO 20 MG TABS tablet TAKE 1 TABLET BY MOUTH DAILY with SUPPER 90 tablet 1   No current facility-administered medications for this visit.   Allergies:  Bupropion hcl, Escitalopram oxalate, Fluticasone-salmeterol, Lisinopril, and Serevent   Social History: The patient  reports that she has never smoked. She has been exposed to tobacco smoke. She has never used smokeless tobacco. She  reports that she does not drink alcohol and does not use drugs.   Family History: The patient's family history includes Cancer in her mother; Colon cancer in an other family member; Heart attack in her father.   ROS: No syncope.  Physical Exam: VS:  BP 124/70   Pulse 95   Ht '5\' 1"'$  (1.549 m)   Wt 199 lb 6.4 oz (90.4 kg)   SpO2 95%   BMI 37.68 kg/m , BMI Body mass index is 37.68 kg/m.  Wt Readings from Last 3 Encounters:  01/20/22 199 lb 6.4 oz (90.4 kg)  01/14/22 198 lb 3.2 oz (89.9 kg)  01/12/22 200 lb (90.7 kg)    General: Patient appears comfortable at rest. HEENT: Conjunctiva and lids normal. Neck: Supple, no elevated JVP or carotid bruits. Lungs: Decreased breath sounds, nonlabored breathing at rest. Cardiac: Regular rate and rhythm, no S3, 1/6 systolic murmur. Abdomen: Soft, nontender, bowel sounds present. Extremities: No pitting edema, distal pulses 2+. Skin: Warm and dry. Musculoskeletal: No kyphosis. Neuropsychiatric: Alert and oriented x3, affect grossly appropriate.  ECG:  An ECG dated 10/08/2021 was personally reviewed today and demonstrated:  Sinus bradycardia with left anterior fascicular block and increased voltage.  QTc 459 ms.  Recent Labwork: 09/08/2021: Magnesium 2.3 01/12/2022: BUN 15; Creatinine, Ser 0.98; Hemoglobin 10.0; Platelets 232.0; Potassium 4.4; Pro B Natriuretic peptide (BNP) 114.0; Sodium 139   Other Studies Reviewed Today:  Echocardiogram 09/20/2019:  1. Left ventricular ejection fraction, by estimation, is 60 to 65%. The  left ventricle has normal function. The left ventricle has no regional  wall motion abnormalities. There is moderate left ventricular hypertrophy.  Left ventricular diastolic  parameters are consistent with Grade II diastolic dysfunction  (pseudonormalization). Elevated left atrial pressure.   2. Right ventricular systolic function is normal. The right ventricular  size is normal. There is mildly elevated pulmonary  artery systolic  pressure.   3. Left atrial size was severely dilated.   4. The mitral valve is normal in structure. Trivial mitral valve  regurgitation. No evidence of mitral stenosis.   5. The aortic valve is tricuspid. Aortic valve regurgitation is not  visualized. No aortic stenosis is present.   6. Indeterminant PASP, inadequate TR jet.   7.  The inferior vena cava is dilated in size with >50% respiratory  variability, suggesting right atrial pressure of 8 mmHg.    Lexiscan Myoview 02/01/2020: There was no ST segment deviation noted during stress. This is a low risk study. The left ventricular ejection fraction is normal (55-65%). Anterior defect represents small prior infarct with mild peri-infarct ischemia vs mild differences in breast attenuation. Either finding would support low risk.  Assessment and Plan:  1.  Persistent atrial fibrillation and atypical atrial flutter (presently in this rhythm).  She has had increasing arrhythmia burden by ILR interrogation over the last 3 months (25%, 50%, and 65%).  She already underwent atrial fibrillation ablation in June of last year and has been on Tikosyn along with Eliquis, CHA2DS2-VASc score is at least 5.  She is symptomatic with atrial arrhythmia and picture is also complicated by component sinus node dysfunction with resting bradycardia although apparently tolerating AV nodal blockers at this point.  I do not think that a cardioversion in the short-term would make much sense as she is very likely to go right back out of rhythm.  Bigger question is whether she would be a candidate for a repeat ablation attempt, or perhaps even consideration of AV node ablation and pacing.  I will make a visit for her to establish with Dr. Myles Gip for further discussions going forward.  2.  OSA on CPAP.  3.  Essential hypertension, blood pressure well controlled today.  Medication Adjustments/Labs and Tests Ordered: Current medicines are reviewed at length  with the patient today.  Concerns regarding medicines are outlined above.   Tests Ordered: Orders Placed This Encounter  Procedures   Ambulatory referral to Cardiac Electrophysiology   EKG 12-Lead    Medication Changes: No orders of the defined types were placed in this encounter.   Disposition:  Follow up  6 months.  Signed, Satira Sark, MD, [facility] 01/20/2022 2:53 PM    [facility] [place], [place], [place] [address] Phone: [phone]; Fax: [phone]

## 2022-01-20 NOTE — Patient Instructions (Signed)
Medication Instructions:  Your physician recommends that you continue on your current medications as directed. Please refer to the Current Medication list given to you today.   Labwork: none  Testing/Procedures: none  Follow-Up:  Your physician recommends that you schedule a follow-up appointment in: 6 months  Any Other Special Instructions Will Be Listed Below (If Applicable).  You have been referred to EP  If you need a refill on your cardiac medications before your next appointment, please call your pharmacy.

## 2022-01-21 ENCOUNTER — Ambulatory Visit (INDEPENDENT_AMBULATORY_CARE_PROVIDER_SITE_OTHER): Payer: Medicare Other

## 2022-01-21 DIAGNOSIS — L570 Actinic keratosis: Secondary | ICD-10-CM | POA: Diagnosis not present

## 2022-01-21 DIAGNOSIS — Z08 Encounter for follow-up examination after completed treatment for malignant neoplasm: Secondary | ICD-10-CM | POA: Diagnosis not present

## 2022-01-21 DIAGNOSIS — M722 Plantar fascial fibromatosis: Secondary | ICD-10-CM

## 2022-01-21 DIAGNOSIS — Z85828 Personal history of other malignant neoplasm of skin: Secondary | ICD-10-CM | POA: Diagnosis not present

## 2022-01-21 DIAGNOSIS — L821 Other seborrheic keratosis: Secondary | ICD-10-CM | POA: Diagnosis not present

## 2022-01-21 DIAGNOSIS — D1801 Hemangioma of skin and subcutaneous tissue: Secondary | ICD-10-CM | POA: Diagnosis not present

## 2022-01-21 DIAGNOSIS — L814 Other melanin hyperpigmentation: Secondary | ICD-10-CM | POA: Diagnosis not present

## 2022-01-21 NOTE — Progress Notes (Signed)
Patient presents today to pick up custom molded foot orthotics, diagnosed with plantar fasciitis by Dr. Jacqualyn Posey.   Orthotics were dispensed and fit was satisfactory. Reviewed instructions for break-in and wear. Written instructions given to patient.  Patient will follow up as needed.   [facility] - order # [id number]

## 2022-01-21 NOTE — Progress Notes (Addendum)
Discharge Progress Report  Patient Details  Name: [name] [name] MRN: [id number] Date of Birth: 1940/12/27 Referring Provider:   Flowsheet Row PULMONARY REHAB OTHER RESP ORIENTATION from 09/02/2021 in [facility]  Referring Provider Dr. Annamaria Boots        Number of Visits: 34  Reason for Discharge:  Patient reached a stable level of exercise. Patient independent in their exercise. Patient has met program and personal goals.  Smoking History:  Social History   Tobacco Use  Smoking Status Never   Passive exposure: Past  Smokeless Tobacco Never    Diagnosis:  Moderate persistent asthma, unspecified whether complicated  ADL UCSD:  Pulmonary Assessment Scores     Row Name 09/02/21 0824 01/21/22 1016       ADL UCSD   ADL Phase Entry Exit    SOB Score total 82 44    Rest 1 1    Walk 3 1    Stairs 5 3    Bath 4 1    Dress 3 1    Shop 3 1      CAT Score   CAT Score 15 14      mMRC Score   mMRC Score 3 3             Initial Exercise Prescription:  Initial Exercise Prescription - 09/02/21 0800       Date of Initial Exercise RX and Referring Provider   Date 09/02/21    Referring Provider Dr. Annamaria Boots    Expected Discharge Date 01/06/22      Treadmill   MPH 1    Grade 0    Minutes 17      NuStep   Level 1    SPM 70    Minutes 22      Prescription Details   Frequency (times per week) 2    Duration Progress to 30 minutes of continuous aerobic without signs/symptoms of physical distress      Intensity   THRR 40-80% of Max Heartrate 56-111    Ratings of Perceived Exertion 11-13    Perceived Dyspnea 0-4      Resistance Training   Training Prescription Yes    Weight 2    Reps 10-15             Discharge Exercise Prescription (Final Exercise Prescription Changes):  Exercise Prescription Changes - 12/25/21 1300       Response to Exercise   Blood Pressure (Admit) 130/58    Blood Pressure (Exercise) 144/50    Blood  Pressure (Exit) 106/50    Heart Rate (Admit) 96 bpm    Heart Rate (Exercise) 120 bpm    Heart Rate (Exit) 95 bpm    Oxygen Saturation (Admit) 97 %    Oxygen Saturation (Exercise) 96 %    Oxygen Saturation (Exit) 98 %    Rating of Perceived Exertion (Exercise) 12    Perceived Dyspnea (Exercise) 13    Duration Continue with 30 min of aerobic exercise without signs/symptoms of physical distress.    Intensity THRR unchanged      Progression   Progression Continue to progress workloads to maintain intensity without signs/symptoms of physical distress.      Resistance Training   Training Prescription Yes    Weight 3    Reps 10-15    Time 10 Minutes      Treadmill   MPH 1.5    Grade 0    Minutes 17    METs 2.15  NuStep   Level 2    SPM 111    Minutes 22    METs 2.3             Functional Capacity:  6 Minute Walk     Row Name 09/02/21 0851 01/06/22 1107       6 Minute Walk   Phase Initial Discharge    Distance 750 feet 600 feet    Walk Time 6 minutes 6 minutes    # of Rest Breaks 0 1    MPH 1.42 1.13    METS 1.22 1.03    RPE 13 13    Perceived Dyspnea  15 13    VO2 Peak 4.29 3.63    Symptoms No Yes (comment)    Comments -- 1 min sitting break due to SOB    Resting HR 59 bpm 98 bpm    Resting BP 148/62 112/60    Resting Oxygen Saturation  98 % 97 %    Exercise Oxygen Saturation  during 6 min walk 96 % 96 %    Max Ex. HR 120 bpm 133 bpm    Max Ex. BP 158/60 150/60    2 Minute Post BP 120/56 120/60      Interval HR   1 Minute HR 110 118    2 Minute HR 109 128    3 Minute HR 119 130    4 Minute HR 116 133    5 Minute HR 116 120    6 Minute HR 120 104    2 Minute Post HR 68 97    Interval Heart Rate? Yes Yes      Interval Oxygen   Interval Oxygen? Yes Yes    Baseline Oxygen Saturation % 98 % 97 %    1 Minute Oxygen Saturation % 97 % 96 %    1 Minute Liters of Oxygen 0 L 0 L    2 Minute Oxygen Saturation % 97 % 96 %    2 Minute Liters of Oxygen 0  L 0 L    3 Minute Oxygen Saturation % 96 % 96 %    3 Minute Liters of Oxygen 0 L 0 L    4 Minute Oxygen Saturation % 97 % 96 %    4 Minute Liters of Oxygen 0 L 0 L    5 Minute Oxygen Saturation % 97 % 96 %    5 Minute Liters of Oxygen 0 L 0 L    6 Minute Oxygen Saturation % 97 % 97 %    6 Minute Liters of Oxygen 0 L 0 L    2 Minute Post Oxygen Saturation % 98 % 98 %    2 Minute Post Liters of Oxygen 0 L 0 L             Psychological, QOL, Others - Outcomes: PHQ 2/9:    01/21/2022   10:18 AM 09/02/2021    8:48 AM 03/18/2018    1:42 PM 10/01/2017   11:17 AM  Depression screen PHQ 2/9  Decreased Interest 0 0 0 0  Down, Depressed, Hopeless 0 0 0 0  PHQ - 2 Score 0 0 0 0  Altered sleeping '1 2 1 2  ' Tired, decreased energy '1 2 2 2  ' Change in appetite 0 0 2 2  Feeling bad or failure about yourself  0 0 0 0  Trouble concentrating 0 0 2 0  Moving slowly or fidgety/restless 0 0 0 0  Suicidal thoughts 0 0 0 0  PHQ-9 Score '2 4 7 6  ' Difficult doing work/chores Somewhat difficult Somewhat difficult Not difficult at all Not difficult at all    Quality of Life:  Quality of Life - 01/06/22 1115       Quality of Life   Select Quality of Life      Quality of Life Scores   Health/Function Pre 23.2 %    Health/Function Post 22.37 %    Health/Function % Change -3.58 %    Socioeconomic Pre 26.08 %    Socioeconomic Post 26.08 %    Socioeconomic % Change  0 %    Psych/Spiritual Pre 24.64 %    Psych/Spiritual Post 24.64 %    Psych/Spiritual % Change 0 %    Family Pre 23.5 %    Family Post 25.13 %    Family % Change 6.94 %    GLOBAL Pre 24.11 %    GLOBAL Post 26.09 %    GLOBAL % Change 8.21 %             Personal Goals: Goals established at orientation with interventions provided to work toward goal.  Personal Goals and Risk Factors at Admission - 09/02/21 0900       Core Components/Risk Factors/Patient Goals on Admission    Weight Management Yes;Obesity;Weight Loss     Intervention Weight Management: Develop a combined nutrition and exercise program designed to reach desired caloric intake, while maintaining appropriate intake of nutrient and fiber, sodium and fats, and appropriate energy expenditure required for the weight goal.;Weight Management: Provide education and appropriate resources to help participant work on and attain dietary goals.;Weight Management/Obesity: Establish reasonable short term and long term weight goals.;Obesity: Provide education and appropriate resources to help participant work on and attain dietary goals.    Expected Outcomes Short Term: Continue to assess and modify interventions until short term weight is achieved;Long Term: Adherence to nutrition and physical activity/exercise program aimed toward attainment of established weight goal;Weight Maintenance: Understanding of the daily nutrition guidelines, which includes 25-35% calories from fat, 7% or less cal from saturated fats, less than 274m cholesterol, less than 1.5gm of sodium, & 5 or more servings of fruits and vegetables daily;Weight Loss: Understanding of general recommendations for a balanced deficit meal plan, which promotes 1-2 lb weight loss per week and includes a negative energy balance of [phone] kcal/d;Understanding recommendations for meals to include 15-35% energy as protein, 25-35% energy from fat, 35-60% energy from carbohydrates, less than 2045mof dietary cholesterol, 20-35 gm of total fiber daily;Understanding of distribution of calorie intake throughout the day with the consumption of 4-5 meals/snacks    Improve shortness of breath with ADL's Yes    Intervention Provide education, individualized exercise plan and daily activity instruction to help decrease symptoms of SOB with activities of daily living.    Expected Outcomes Short Term: Improve cardiorespiratory fitness to achieve a reduction of symptoms when performing ADLs;Long Term: Be able to perform more ADLs  without symptoms or delay the onset of symptoms    Personal Goal Other Yes    Personal Goal She wants to improve her energy level and to be able to walk longer distances.    Intervention Attend CR 3 x week and supplement with at home exercise 2 x week.     Expected Outcomes Achieve personal goals.               Personal Goals Discharge:  Goals and Risk Factor Review  [place] Name 09/08/21 1445 09/12/21 0915 09/12/21 0937 10/08/21 1334 11/04/21 0921     Core Components/Risk Factors/Patient Goals Review   Personal Goals Review -- -- -- Improve shortness of breath with ADL's;Develop more efficient breathing techniques such as purse lipped breathing and diaphragmatic breathing and practicing self-pacing with activity.;Increase knowledge of respiratory medications and ability to use respiratory devices properly. Improve shortness of breath with ADL's;Develop more efficient breathing techniques such as purse lipped breathing and diaphragmatic breathing and practicing self-pacing with activity.;Increase knowledge of respiratory medications and ability to use respiratory devices properly.   Review Patient completed 1 session. She has completed this program in the passed and is doing well in this program. She has recently had chronic hip pain which limitions.  She  says the program helped her alot. She has more strength and stamina now with increased energy.  She plans to continue exercising at the [facility] and by walking with some friends. We will continue to monitor. Patient completed 1 session. She has completed this program in the passed and is doing well in this program. She has recently had chronic hip pain which limitions.  She  says the program helped her alot. She has more strength and stamina now with increased energy.  She plans to continue exercising at the [facility] and by walking with some friends. We will continue to monitor. Patient completed 4 session. She has completed this program in the passed  and is doing well in this program. She has recently had chronic hip pain which limitions.  She  says the program helped her alot. She has more strength and stamina now with increased energy.  She plans to continue exercising at the [facility] and by walking with some friends. We will continue to monitor. Patient completed 10 session.  Pt has chronic hip pain which limitations.  Pt will benefit from the  program. She will have more strength and stamina now with increased energy, which are her goals. Patient completed 17 session.  Pt has chronic hip pain which limitations.  Pt will benefit from the  program. She will have more strength and stamina now with increased energy, which are her goals.   Expected Outcomes Patient will  complete the program and meet her personal goals. -- Patient will  complete the program and meet her personal goals. Patient will  complete the program and meet her personal goals. Patient will  complete the program and meet her personal goals.    [place] Name 12/04/21 1121 01/21/22 1028           Core Components/Risk Factors/Patient Goals Review   Personal Goals Review Improve shortness of breath with ADL's;Develop more efficient breathing techniques such as purse lipped breathing and diaphragmatic breathing and practicing self-pacing with activity.;Increase knowledge of respiratory medications and ability to use respiratory devices properly. Improve shortness of breath with ADL's;Develop more efficient breathing techniques such as purse lipped breathing and diaphragmatic breathing and practicing self-pacing with activity.;Increase knowledge of respiratory medications and ability to use respiratory devices properly.      Review Patient completed 27 session.  Pt has chronic hip pain with limitations.  Pt will benefiting from the program. She will have more strength and stamina now with increased energy, which are her goals. Pt graduated from [place] [facility] after 34 sessions. She had consistent attendance  and gave good effort in the program. She progressed well until the last couple of weeks in the program. During the last couple of weeks she began to experience increased  SOB, and this was likely due to her atrial fibrillation. Her heart rates were noted to be much higher and she stated that she could tell that she was in atrial fibrillation. She reports that overall her energy and strength was increased. She was able to demonstrate pursed lip breathing when cued.      Expected Outcomes Patient will  complete the program and meet her personal goals. Pt will continue to work towards their goals post discharge.               Exercise Goals and Review:  Exercise Goals     Row Name 09/02/21 0854 09/16/21 1247 10/14/21 1304 11/11/21 1243 12/09/21 1259     Exercise Goals   Increase Physical Activity Yes Yes Yes Yes Yes   Intervention Provide advice, education, support and counseling about physical activity/exercise needs.;Develop an individualized exercise prescription for aerobic and resistive training based on initial evaluation findings, risk stratification, comorbidities and participant's personal goals. Provide advice, education, support and counseling about physical activity/exercise needs.;Develop an individualized exercise prescription for aerobic and resistive training based on initial evaluation findings, risk stratification, comorbidities and participant's personal goals. Provide advice, education, support and counseling about physical activity/exercise needs.;Develop an individualized exercise prescription for aerobic and resistive training based on initial evaluation findings, risk stratification, comorbidities and participant's personal goals. Provide advice, education, support and counseling about physical activity/exercise needs.;Develop an individualized exercise prescription for aerobic and resistive training based on initial evaluation findings, risk stratification, comorbidities and  participant's personal goals. Provide advice, education, support and counseling about physical activity/exercise needs.;Develop an individualized exercise prescription for aerobic and resistive training based on initial evaluation findings, risk stratification, comorbidities and participant's personal goals.   Expected Outcomes Short Term: Attend rehab on a regular basis to increase amount of physical activity.;Long Term: Add in home exercise to make exercise part of routine and to increase amount of physical activity.;Long Term: Exercising regularly at least 3-5 days a week. Short Term: Attend rehab on a regular basis to increase amount of physical activity.;Long Term: Add in home exercise to make exercise part of routine and to increase amount of physical activity.;Long Term: Exercising regularly at least 3-5 days a week. Short Term: Attend rehab on a regular basis to increase amount of physical activity.;Long Term: Add in home exercise to make exercise part of routine and to increase amount of physical activity.;Long Term: Exercising regularly at least 3-5 days a week. Short Term: Attend rehab on a regular basis to increase amount of physical activity.;Long Term: Add in home exercise to make exercise part of routine and to increase amount of physical activity.;Long Term: Exercising regularly at least 3-5 days a week. Short Term: Attend rehab on a regular basis to increase amount of physical activity.;Long Term: Add in home exercise to make exercise part of routine and to increase amount of physical activity.;Long Term: Exercising regularly at least 3-5 days a week.   Increase Strength and Stamina Yes Yes Yes Yes Yes   Intervention Provide advice, education, support and counseling about physical activity/exercise needs.;Develop an individualized exercise prescription for aerobic and resistive training based on initial evaluation findings, risk stratification, comorbidities and participant's personal goals.  Provide advice, education, support and counseling about physical activity/exercise needs.;Develop an individualized exercise prescription for aerobic and resistive training based on initial evaluation findings, risk stratification, comorbidities and participant's personal goals. Provide advice, education, support and counseling about physical activity/exercise needs.;Develop an individualized exercise prescription for aerobic and resistive training based on initial  evaluation findings, risk stratification, comorbidities and participant's personal goals. Provide advice, education, support and counseling about physical activity/exercise needs.;Develop an individualized exercise prescription for aerobic and resistive training based on initial evaluation findings, risk stratification, comorbidities and participant's personal goals. Provide advice, education, support and counseling about physical activity/exercise needs.;Develop an individualized exercise prescription for aerobic and resistive training based on initial evaluation findings, risk stratification, comorbidities and participant's personal goals.   Expected Outcomes Short Term: Increase workloads from initial exercise prescription for resistance, speed, and METs.;Short Term: Perform resistance training exercises routinely during rehab and add in resistance training at home;Long Term: Improve cardiorespiratory fitness, muscular endurance and strength as measured by increased METs and functional capacity (6MWT) Short Term: Increase workloads from initial exercise prescription for resistance, speed, and METs.;Short Term: Perform resistance training exercises routinely during rehab and add in resistance training at home;Long Term: Improve cardiorespiratory fitness, muscular endurance and strength as measured by increased METs and functional capacity (6MWT) Short Term: Increase workloads from initial exercise prescription for resistance, speed, and METs.;Short  Term: Perform resistance training exercises routinely during rehab and add in resistance training at home;Long Term: Improve cardiorespiratory fitness, muscular endurance and strength as measured by increased METs and functional capacity (6MWT) Short Term: Increase workloads from initial exercise prescription for resistance, speed, and METs.;Short Term: Perform resistance training exercises routinely during rehab and add in resistance training at home;Long Term: Improve cardiorespiratory fitness, muscular endurance and strength as measured by increased METs and functional capacity (6MWT) Short Term: Increase workloads from initial exercise prescription for resistance, speed, and METs.;Short Term: Perform resistance training exercises routinely during rehab and add in resistance training at home;Long Term: Improve cardiorespiratory fitness, muscular endurance and strength as measured by increased METs and functional capacity (6MWT)   Able to understand and use rate of perceived exertion (RPE) scale Yes Yes Yes Yes Yes   Intervention Provide education and explanation on how to use RPE scale Provide education and explanation on how to use RPE scale Provide education and explanation on how to use RPE scale Provide education and explanation on how to use RPE scale Provide education and explanation on how to use RPE scale   Expected Outcomes Short Term: Able to use RPE daily in rehab to express subjective intensity level;Long Term:  Able to use RPE to guide intensity level when exercising independently Short Term: Able to use RPE daily in rehab to express subjective intensity level;Long Term:  Able to use RPE to guide intensity level when exercising independently Short Term: Able to use RPE daily in rehab to express subjective intensity level;Long Term:  Able to use RPE to guide intensity level when exercising independently Short Term: Able to use RPE daily in rehab to express subjective intensity level;Long Term:   Able to use RPE to guide intensity level when exercising independently Short Term: Able to use RPE daily in rehab to express subjective intensity level;Long Term:  Able to use RPE to guide intensity level when exercising independently   Able to understand and use Dyspnea scale Yes Yes Yes Yes Yes   Intervention Provide education and explanation on how to use Dyspnea scale Provide education and explanation on how to use Dyspnea scale Provide education and explanation on how to use Dyspnea scale Provide education and explanation on how to use Dyspnea scale Provide education and explanation on how to use Dyspnea scale   Expected Outcomes Short Term: Able to use Dyspnea scale daily in rehab to express subjective sense of shortness of  breath during exertion;Long Term: Able to use Dyspnea scale to guide intensity level when exercising independently Short Term: Able to use Dyspnea scale daily in rehab to express subjective sense of shortness of breath during exertion;Long Term: Able to use Dyspnea scale to guide intensity level when exercising independently Short Term: Able to use Dyspnea scale daily in rehab to express subjective sense of shortness of breath during exertion;Long Term: Able to use Dyspnea scale to guide intensity level when exercising independently Short Term: Able to use Dyspnea scale daily in rehab to express subjective sense of shortness of breath during exertion;Long Term: Able to use Dyspnea scale to guide intensity level when exercising independently Short Term: Able to use Dyspnea scale daily in rehab to express subjective sense of shortness of breath during exertion;Long Term: Able to use Dyspnea scale to guide intensity level when exercising independently   Knowledge and understanding of Target Heart Rate Range (THRR) Yes Yes Yes Yes Yes   Intervention Provide education and explanation of THRR including how the numbers were predicted and where they are located for reference Provide education  and explanation of THRR including how the numbers were predicted and where they are located for reference Provide education and explanation of THRR including how the numbers were predicted and where they are located for reference Provide education and explanation of THRR including how the numbers were predicted and where they are located for reference Provide education and explanation of THRR including how the numbers were predicted and where they are located for reference   Expected Outcomes Long Term: Able to use THRR to govern intensity when exercising independently;Short Term: Able to use daily as guideline for intensity in rehab;Short Term: Able to state/look up THRR Long Term: Able to use THRR to govern intensity when exercising independently;Short Term: Able to use daily as guideline for intensity in rehab;Short Term: Able to state/look up THRR Long Term: Able to use THRR to govern intensity when exercising independently;Short Term: Able to use daily as guideline for intensity in rehab;Short Term: Able to state/look up THRR Long Term: Able to use THRR to govern intensity when exercising independently;Short Term: Able to use daily as guideline for intensity in rehab;Short Term: Able to state/look up THRR Long Term: Able to use THRR to govern intensity when exercising independently;Short Term: Able to use daily as guideline for intensity in rehab;Short Term: Able to state/look up THRR   Understanding of Exercise Prescription Yes Yes Yes Yes Yes   Intervention Provide education, explanation, and written materials on patient's individual exercise prescription Provide education, explanation, and written materials on patient's individual exercise prescription Provide education, explanation, and written materials on patient's individual exercise prescription Provide education, explanation, and written materials on patient's individual exercise prescription Provide education, explanation, and written materials on  patient's individual exercise prescription   Expected Outcomes Short Term: Able to explain program exercise prescription;Long Term: Able to explain home exercise prescription to exercise independently Short Term: Able to explain program exercise prescription;Long Term: Able to explain home exercise prescription to exercise independently Short Term: Able to explain program exercise prescription;Long Term: Able to explain home exercise prescription to exercise independently Short Term: Able to explain program exercise prescription;Long Term: Able to explain home exercise prescription to exercise independently Short Term: Able to explain program exercise prescription;Long Term: Able to explain home exercise prescription to exercise independently            Exercise Goals Re-Evaluation:  Exercise Goals Re-Evaluation     Row Name  09/16/21 1247 10/14/21 1305 11/11/21 1244 12/09/21 1300       Exercise Goal Re-Evaluation   Exercise Goals Review Increase Physical Activity;Increase Strength and Stamina;Able to understand and use rate of perceived exertion (RPE) scale;Able to understand and use Dyspnea scale;Knowledge and understanding of Target Heart Rate Range (THRR);Understanding of Exercise Prescription Increase Physical Activity;Increase Strength and Stamina;Able to understand and use rate of perceived exertion (RPE) scale;Able to understand and use Dyspnea scale;Knowledge and understanding of Target Heart Rate Range (THRR);Understanding of Exercise Prescription Increase Physical Activity;Increase Strength and Stamina;Able to understand and use rate of perceived exertion (RPE) scale;Able to understand and use Dyspnea scale;Able to check pulse independently;Understanding of Exercise Prescription Increase Physical Activity;Increase Strength and Stamina;Able to understand and use rate of perceived exertion (RPE) scale;Able to understand and use Dyspnea scale;Knowledge and understanding of Target Heart Rate  Range (THRR);Understanding of Exercise Prescription    Comments Pt has completed 4 sessions in pulmonary rehab. She is motivated and wants to push herself during class to increase her workloads. She has notived that she is able to do yard work outside without having to stop frequently due to SOB. She is currently exercising at 2.0 METs on the stepper. Will continue to monitor and progress as able. Pt has completed 11 sessions of PR. She continues to be motivated and pushes herself during class and has increased her workload. She goes to physical therapy two days a week. She is currently exercising at 2.3 METs on the stepper. Pt has completed 19 sessions of PR. She continues to push heself and is motivated to increase her workloads. She continues to do physical therapy once a week and is doing home exercises everyday. She is currently exercising at 2.0 METs on the stepper. Will continue to monitor and progress as able. Pt has completed 27 sessions of PR. She is pushing herself during class and increasing her workloads slowly. She is doing exercises given to her by physical therapy at home 3xs a week in the mornings . She is currently exercising at 2.15 METs on the treadmill. Pt is wanting to signup for maintance program when she graduates. Will contiune to monitor and progress as able.    Expected Outcomes Through exercise at rehab and at home, the patient will meet their stated goals. Through exercise at rehab and at home, the patient will meet their stated goals. Through exercise at rehab and at home, the patient will meet their stated goals. Through exercise at rehab and at home, the patient will meet their stated goals.             Nutrition & Weight - Outcomes:  Pre Biometrics - 09/02/21 0855       Pre Biometrics   Height 5' 1.5" (1.562 m)    Weight 196 lb 10.4 oz (89.2 kg)    Waist Circumference 46 inches    Hip Circumference 47 inches    Waist to Hip Ratio 0.98 %    BMI (Calculated) 36.56     Triceps Skinfold 8 mm    % Body Fat 42.7 %    Grip Strength 24.8 kg    Flexibility 0 in    Single Leg Stand 0 seconds             Post Biometrics - 01/06/22 1115        Post  Biometrics   Height 5' 1.5" (1.562 m)    Weight 196 lb 10.4 oz (89.2 kg)    Waist Circumference 46 inches  Hip Circumference 45 inches    Waist to Hip Ratio 1.02 %    BMI (Calculated) 36.56    Triceps Skinfold 5 mm    % Body Fat 40.4 %    Grip Strength 31.4 kg    Flexibility 0 in    Single Leg Stand 0 seconds             Nutrition:  Nutrition Therapy & Goals - 09/17/21 0727       Personal Nutrition Goals   Comments Patient scored 71 on his diet assessment. We offer 2 educational sessions on healthy nutrition with handouts and assistance with RD referral if patient is interested.      Intervention Plan   Intervention Nutrition handout(s) given to patient.    Expected Outcomes Short Term Goal: Understand basic principles of dietary content, such as calories, fat, sodium, cholesterol and nutrients.             Nutrition Discharge:  Nutrition Assessments - 01/21/22 1022       MEDFICTS Scores   Pre Score 71    Post Score 53    Score Difference -18             Education Questionnaire Score:  Knowledge Questionnaire Score - 01/21/22 1015       Knowledge Questionnaire Score   Pre Score 13/18    Post Score 13/18             Goals reviewed with patient; copy given to patient. Pt graduated from [place] after 34 sessions. Her walk test distance actually decreased by 20%, but she completed her discharge walk test when she was dealing with an asthma exacerbation. She otherwise progressed well in the program and reports it was beneficial. Her MET level at discharge was 2.15. She is interested in joining our forever fit maintenance program, and this will be how she continues her exercise post discharge.

## 2022-01-26 DIAGNOSIS — H524 Presbyopia: Secondary | ICD-10-CM | POA: Diagnosis not present

## 2022-01-26 DIAGNOSIS — H04123 Dry eye syndrome of bilateral lacrimal glands: Secondary | ICD-10-CM | POA: Diagnosis not present

## 2022-01-26 DIAGNOSIS — I10 Essential (primary) hypertension: Secondary | ICD-10-CM | POA: Diagnosis not present

## 2022-01-27 ENCOUNTER — Encounter: Payer: Self-pay | Admitting: Cardiovascular Disease

## 2022-01-27 ENCOUNTER — Ambulatory Visit: Payer: Medicare Other | Attending: Cardiovascular Disease | Admitting: Cardiovascular Disease

## 2022-01-27 VITALS — BP 128/52 | HR 64 | Ht 61.0 in | Wt 199.0 lb

## 2022-01-27 DIAGNOSIS — I4819 Other persistent atrial fibrillation: Secondary | ICD-10-CM | POA: Diagnosis not present

## 2022-01-27 NOTE — Progress Notes (Signed)
Cardiology Office Note:    Date:  01/27/2022   ID:  [name] [name], DOB 1940-05-12, MRN [id number]  PCP:  Monico Blitz, [place] [facility] Cardiologist:  Rozann Lesches, MD Electrophysiologist:  Melida Quitter, MD     Referring MD: Monico Blitz, MD   Chief complaint: fatigue, shortness of breath  History of Present Illness:    [name] [name] is a 81 y.o. female with a hx of paroxysmal AF, atrial flutter, COPD, OSA who presents for EP follow-up.  She is a former patient of Dr. Rayann Heman and underwent an AF ablation in June, 2022. There was some reconnection of the right inferior pulmonary vein and the left superior veins. These were re-ablated. Additionally, a posterior wall box lesion set was created.  She has been managed on Tikosyn and xarelto since. She saw Dr. Domenic Polite in follow-up a few days ago and noted that she has not been feeling well recently. Her ILR has shown an increase in AF burden, now 65% (9/23 month 50%, 8/23 was 25%). ECG during that clinic visit showed an atypical flutter with a cycle length of about 300 msec.  Today, she confirms a history of progressive fatigue beginning in July or August. No chest pain, syncope, pre-syncope.   Past Medical History:  Diagnosis Date   Allergic rhinitis    Anxiety    Asthma    Atrial flutter (HCC)    COPD (chronic obstructive pulmonary disease) ([place])    Depression    Essential hypertension    Hyperlipidemia    Lumbar disc disease    Obesity    OSA (obstructive sleep apnea)    CPAP   Paroxysmal atrial fibrillation (HCC)    Element of tachycardia bradycardia syndrome   Respiratory failure ([place])     Past Surgical History:  Procedure Laterality Date   ANTERIOR FUSION LUMBAR SPINE     ATRIAL FIBRILLATION ABLATION N/A 09/12/2020   Procedure: ATRIAL FIBRILLATION ABLATION;  Surgeon: Thompson Grayer, MD;  Location: [facility];  Service: Cardiovascular;  Laterality: N/A;   BIOPSY   08/27/2016   Procedure: BIOPSY;  Surgeon: Rogene Houston, MD;  [facility];  Service: Endoscopy;;  FOUR GASTRIC POLYPS BIOPSIED   CARDIOVERSION N/A 03/28/2014   Procedure: CARDIOVERSION;  Surgeon: Fay Records, MD;  Location: [facility];  Service: Cardiovascular;  Laterality: N/A;   CARDIOVERSION N/A 10/22/2016   Procedure: CARDIOVERSION;  Surgeon: Sueanne Margarita, MD;  Location: [facility]OSCOPY;  Service: Cardiovascular;  Laterality: N/A;   CARDIOVERSION N/A 11/26/2020   Procedure: CARDIOVERSION;  Surgeon: Fay Records, MD;  Location: [facility];  Service: Cardiovascular;  Laterality: N/A;   CATARACT EXTRACTION     COLONOSCOPY N/A 08/04/2012   Procedure: COLONOSCOPY;  Surgeon: Rogene Houston, MD;  [facility]: [facility];  Service: Endoscopy;  Laterality: N/A;  730-rescheduled to [place] notified pt   ELECTROPHYSIOLOGIC STUDY N/A 09/18/2014   Procedure: Atrial Fibrillation Ablation;  Surgeon: Thompson Grayer, MD;  Location: [facility];  Service: Cardiovascular;  Laterality: N/A;   ESOPHAGOGASTRODUODENOSCOPY N/A 08/27/2016   Procedure: ESOPHAGOGASTRODUODENOSCOPY (EGD);  Surgeon: Rogene Houston, MD;  [facility];  Service: Endoscopy;  Laterality: N/A;  1200   implantable loop recorder placement  11/22/2020   Medtronic Reveal Linq model LNQ 11 (SN [id number]) implantable loop recorder   LASIK     Both eyes   TEE WITHOUT CARDIOVERSION N/A 09/18/2014   Procedure: TRANSESOPHAGEAL ECHOCARDIOGRAM (TEE);  Surgeon: Larey Dresser, MD;  Location: [facility];  Service: Cardiovascular;  Laterality: N/A;   TEE WITHOUT CARDIOVERSION N/A 10/22/2016   Procedure: TRANSESOPHAGEAL ECHOCARDIOGRAM (TEE);  Surgeon: Sueanne Margarita, MD;  Location: [facility] ENDOSCOPY;  Service: Cardiovascular;  Laterality: N/A;   TEE WITHOUT CARDIOVERSION N/A 08/24/2017   Procedure: TRANSESOPHAGEAL ECHOCARDIOGRAM (TEE);  Surgeon: Josue Hector, MD;  Location: [facility] ENDOSCOPY;  Service: Cardiovascular;   Laterality: N/A;   TOTAL HIP ARTHROPLASTY  03/30/2008   Right    Current Medications: Current Meds  Medication Sig   acetaminophen (TYLENOL) 500 MG tablet Take 500 mg by mouth every 8 (eight) hours as needed for headache.   antiseptic oral rinse (BIOTENE) LIQD 15 mLs by Mouth Rinse route daily as needed for dry mouth.   atorvastatin (LIPITOR) 10 MG tablet Take 10 mg by mouth daily at 6 PM.   azelastine (ASTELIN) 0.1 % nasal spray Place 2 sprays into both nostrils 2 (two) times daily. Use in each nostril as directed   benzonatate (TESSALON) 200 MG capsule Take 1 capsule (200 mg total) by mouth 3 (three) times daily as needed for cough.   Biotin 1 MG CAPS Take 1,000 mg by mouth 3 (three) times a week.    budesonide (PULMICORT) 0.5 MG/2ML nebulizer solution USE ONE vial via NEBULIZER TWICE DAILY DX CODEJ44.9   Calcium Carbonate-Vit D-Min (QC CALCIUM-MAGNESIUM-ZINC-D3) 333.4-133 MG-UNIT TABS Take 1 tablet by mouth 2 (two) times daily.    cetirizine (ZYRTEC) 10 MG tablet Take 10 mg by mouth at bedtime as needed for allergies.   clindamycin (CLEOCIN) 300 MG capsule Take 600 mg by mouth See admin instructions. Take prior to dental procedures   diltiazem (CARDIZEM CD) 120 MG 24 hr capsule TAKE ONE CAPSULE BY MOUTH in THE morning AND ONE capsule in THE evening (Patient taking differently: Take 120 mg by mouth at bedtime.)   diltiazem (CARDIZEM CD) 240 MG 24 hr capsule Take 1 capsule (240 mg total) by mouth every morning.   diltiazem (CARDIZEM) 30 MG tablet TAKE 1 TABLET BY MOUTH AS NEEDED FOR PALPITATIONS   dofetilide (TIKOSYN) 500 MCG capsule TAKE ONE CAPSULE BY MOUTH TWICE A DAY   EPINEPHrine 0.3 mg/0.3 mL IJ SOAJ injection Inject 0.3 mg into the muscle as needed for anaphylaxis.   famotidine (PEPCID) 20 MG tablet Take 2 tablets (40 mg total) by mouth at bedtime as needed.   fluticasone (FLONASE) 50 MCG/ACT nasal spray Place 1 spray into both nostrils 2 (two) times daily.   hydrALAZINE (APRESOLINE)  100 MG tablet Take 100 mg by mouth 2 (two) times daily.   HYDROcodone bit-homatropine (HYCODAN) 5-1.5 MG/5ML syrup Take 5 mLs by mouth every 6 (six) hours as needed for cough.   Hypertonic Nasal Wash (SINUS RINSE) PACK Place 1 each into the nose daily as needed (clear nasal passage). Neilmed sinus rinse   ipratropium-albuterol (DUONEB) 0.5-2.5 (3) MG/3ML SOLN Take 3 mLs by nebulization 4 (four) times daily as needed (Shortness of breathing).   losartan (COZAAR) 100 MG tablet TAKE 1 TABLET BY MOUTH DAILY   Magnesium 250 MG TABS Take 250 mg by mouth daily.   montelukast (SINGULAIR) 10 MG tablet Take 10 mg by mouth at bedtime.   omeprazole (PRILOSEC) 40 MG capsule TAKE ONE CAPSULE BY MOUTH EVERY DAY   potassium chloride SA (KLOR-CON M) 20 MEQ tablet TAKE 1 TABLET BY MOUTH DAILY   saccharomyces boulardii (FLORASTOR) 250 MG capsule Take 250 mg by mouth 2 (two) times daily.  umeclidinium-vilanterol (ANORO ELLIPTA) 62.5-25 MCG/ACT AEPB INHALE ONE PUFF INTO THE LUNGS DAILY (Patient taking differently: 1 puff every morning.)   Wheat Dextrin (BENEFIBER ON THE GO) POWD Take 10 mLs by mouth daily.   XARELTO 20 MG TABS tablet TAKE 1 TABLET BY MOUTH DAILY with SUPPER   [DISCONTINUED] ipratropium (ATROVENT) 0.03 % nasal spray Place 2 sprays into both nostrils 2 (two) times daily.   [DISCONTINUED] Polyethyl Glycol-Propyl Glycol (SYSTANE) 0.4-0.3 % SOLN Place 1 drop into both eyes daily as needed (Dry eye).     Allergies:   Bupropion hcl, Escitalopram oxalate, Fluticasone-salmeterol, Lisinopril, and Serevent   Social History   Socioeconomic History   Marital status: Single    Spouse name: Not on file   Number of children: Not on file   Years of education: Not on file   Highest education level: Not on file  Occupational History   Occupation: Retired: Presenter, broadcasting  Tobacco Use   Smoking status: Never    Passive exposure: Past   Smokeless tobacco: Never  Vaping Use   Vaping Use: Never used   Substance and Sexual Activity   Alcohol use: No    Alcohol/week: 0.0 standard drinks of alcohol   Drug use: No   Sexual activity: Not on file  Other Topics Concern   Not on file  Social History Narrative   Pt lives in [place] alone. She was never married.   Retired Customer service manager.   Attends [employer]   Social Determinants of Health   Financial Resource Strain: Not on [employer] Insecurity: Not on file  Transportation Needs: Not on file  Physical Activity: Not on file  Stress: Not on file  Social Connections: Not on file     Family History: The patient's family history includes Cancer in her mother; Colon cancer in an other family member; Heart attack in her father.  ROS:   Please see the history of present illness.    All other systems reviewed and are negative.  EKGs/Labs/Other Studies Reviewed:     EKG:  Last EKG results: today - sinus rhythm with PACs. Very low atrial amplitude.   Recent Labs: 09/08/2021: Magnesium 2.3 01/12/2022: BUN 15; Creatinine, Ser 0.98; Hemoglobin 10.0; Platelets 232.0; Potassium 4.4; Pro B Natriuretic peptide (BNP) 114.0; Sodium 139    Risk Assessment/Calculations:    CHA2DS2-VASc Score = 5   This indicates a 7.2% annual risk of stroke. The patient's score is based upon: CHF History: 0 HTN History: 1 Diabetes History: 0 Stroke History: 0 Vascular Disease History: 1 Age Score: 2 Gender Score: 1           Physical Exam:    VS:  BP (!) 128/52   Pulse 64   Ht '5\' 1"'$  (1.549 m)   Wt 199 lb (90.3 kg)   SpO2 96%   BMI 37.60 kg/m     Wt Readings from Last 3 Encounters:  01/27/22 199 lb (90.3 kg)  01/20/22 199 lb 6.4 oz (90.4 kg)  01/14/22 198 lb 3.2 oz (89.9 kg)     GEN: Well nourished, well developed in no acute distress CARDIAC: irregular, no murmurs, rubs, gallops RESPIRATORY:  Normal work of breathing MUSCULOSKELETAL: trace edema    ASSESSMENT & PLAN:    Persistent atrial fibrillation: ECG today  is irregular but there are visible P waves. Many of the ILR recordings appear to be sinus rhythm with frequent PACs though others are AF, and I suspect her burden is increasing.  Patient reports significant increase in symptoms. We discussed management options including escalating to amiodarone, repeat mapping and ablation. I discussed the relative risks and benefits -- both of which include risks of serious complications including death. She has a preference for repeat ablation. Atypical atrial flutter: documented on 10/24 ECG. Likely involving the posterior LA.  Secondary hypercoagulable state: continue anticoagulation for CHADS2Vasc of 3       Medication Adjustments/Labs and Tests Ordered: Current medicines are reviewed at length with the patient today.  Concerns regarding medicines are outlined above.  Orders Placed This Encounter  Procedures   EKG 12-Lead   No orders of the defined types were placed in this encounter.    Signed, Melida Quitter, MD  01/27/2022 3:23 PM    [place]

## 2022-01-27 NOTE — Patient Instructions (Signed)
Medication Instructions:  Your physician recommends that you continue on your current medications as directed. Please refer to the Current Medication list given to you today.  *If you need a refill on your cardiac medications before your next appointment, please call your pharmacy*   Lab Work: BMET and CBC If you have labs (blood work) drawn today and your tests are completely normal, you will receive your results only by: [place] (if you have MyChart) OR A paper copy in the mail If you have any lab test that is abnormal or we need to change your treatment, we will call you to review the results.   Testing/Procedures: Your physician has requested that you have cardiac CT. Cardiac computed tomography (CT) is a painless test that uses an x-ray machine to take clear, detailed pictures of your heart. For further information please visit [email or web address]. Please follow instruction sheet as given.  Your physician has recommended that you have an ablation. Catheter ablation is a medical procedure used to treat some cardiac arrhythmias (irregular heartbeats). During catheter ablation, a long, thin, flexible tube is put into a blood vessel in your groin (upper thigh), or neck. This tube is called an ablation catheter. It is then guided to your heart through the blood vessel. Radio frequency waves destroy small areas of heart tissue where abnormal heartbeats may cause an arrhythmia to start. Please see the instruction sheet given to you today.  Follow-Up: At [facility], you and your health needs are our priority.  As part of our continuing mission to provide you with exceptional heart care, we have created designated Provider Care Teams.  These Care Teams include your primary Cardiologist (physician) and Advanced Practice Providers (APPs -  Physician Assistants and Nurse Practitioners) who all work together to provide you with the care you need, when you need it.  Your next  appointment:   See instruction letter  Important Information About Sugar

## 2022-01-28 ENCOUNTER — Telehealth: Payer: Self-pay | Admitting: Internal Medicine

## 2022-01-28 ENCOUNTER — Telehealth: Payer: Self-pay | Admitting: Cardiovascular Disease

## 2022-01-28 ENCOUNTER — Ambulatory Visit: Payer: Medicare Other | Admitting: Podiatry

## 2022-01-28 DIAGNOSIS — J4520 Mild intermittent asthma, uncomplicated: Secondary | ICD-10-CM

## 2022-01-28 NOTE — Telephone Encounter (Signed)
Patient states she has to get a ride for 12/20 procedure with Dr. Myles Gip and she would like to know what time she needs to arrive.

## 2022-01-28 NOTE — Telephone Encounter (Signed)
Pt called the office about pulmonary rehab. Pt does plan to continue in pulmonary rehab and they are needing an order for the maintenance pulmonary rehab program.  Dr. Annamaria Boots, please advise if you are okay with [place] placing the order for this for pt. Pt just completed the prior pulmonary rehab in October.

## 2022-01-28 NOTE — Telephone Encounter (Signed)
Called and spoke with pt letting her know that [place] was fine with [place] enrolling her in maintenance pulmonary rehab and she verbalized understanding. Referral placed. Nothing further needed.

## 2022-01-28 NOTE — Telephone Encounter (Signed)
Yes- please enroll in Pulmonary Rehab maintenance program

## 2022-01-28 NOTE — Telephone Encounter (Signed)
Spoke with the patient and advised that she will need to be at the hospital at 9:30am on 12/20. Advised that she should expect to be at the hospital for a total of 8-10 hours. She will call back if she is unable to find a ride.

## 2022-01-29 NOTE — Progress Notes (Signed)
Carelink Summary Report / Loop Recorder 

## 2022-02-03 ENCOUNTER — Other Ambulatory Visit: Payer: Self-pay | Admitting: Nurse Practitioner

## 2022-02-04 DIAGNOSIS — L97519 Non-pressure chronic ulcer of other part of right foot with unspecified severity: Secondary | ICD-10-CM | POA: Diagnosis not present

## 2022-02-04 DIAGNOSIS — I10 Essential (primary) hypertension: Secondary | ICD-10-CM | POA: Diagnosis not present

## 2022-02-04 DIAGNOSIS — Z299 Encounter for prophylactic measures, unspecified: Secondary | ICD-10-CM | POA: Diagnosis not present

## 2022-02-07 ENCOUNTER — Encounter (INDEPENDENT_AMBULATORY_CARE_PROVIDER_SITE_OTHER): Payer: Self-pay | Admitting: Gastroenterology

## 2022-02-07 NOTE — Progress Notes (Unsigned)
HPI female never smoker followed for OSA, allergic rhinitis, asthma, Hyper IgE,  complicated by A. Fib/Coumadin, HBP, GERD, obesity NPSG prior to EMR in 2012 Office Spirometry 09/02/16-WNL. FVC 2.06/76%, FEV1 1.57/77%, ratio 0.76, FEF 25-75% 1.26/80% Allergy labs 09/02/16- eosinophils 200, total IgE 4230 causing elevation of all specific allergen antibody levels including Aspergillus. Nucala  + 2018 PFT-10/13/16-minimal obstruction without response to dilator, minimal reduction of diffusion. FVC 2.02/77%, FEV1 1.62/83%, ratio 0.80, TLC 92%, DLCO 77% CT soft tissue neck 09/22/16-1. 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  Subsegmental atelectasis and nonspecific linear patchy densities in the lungs as described. They have a benign appearance.   Small hyperdense masses in the mediastinum are stable compared with 2015 supporting benign etiology such as ectopic thyroid tissue.  Three-vessel prominent coronary artery calcification.  Bibasilar bronchiolectasis. Labs 09/20/2017-WBC 14,900, hemoglobin 11.2, eosinophils absent, lymphocytes low( ?steroids or viral?), BNP 220, CMET wnl IgE 04/12/2018- 2,574, 10/16/16- 4,302, 11/09/13- 3,513 Office Spirometry 04/12/2018-mild restriction of exhaled volume.  Possible mild obstructive airways.  FVC 1.9/73%, FEV1 1.4/75%, ratio 1.76, FEF 25-75% 1.2/80%  -------------------------------------------------------------------------------------------   08/07/21-81 year old female never smoker followed for OSA, allergic rhinitis, asthma/ Nucala, Hyper IgE, complicated by PA.FibXarelto, HBP, GERD, obesity -Neb Pulmicort/  Duoneb   Anoro, Flonase, Zyrtec, Singulair,  CPAP 9/Adapt Download- compliance 100%, AHI 2.7/ hr Body weight today- 198 lbs Covid vax- 5 Moderna Flu vax-had -----Patient would like to talk about respiratory therapy. Just started PT and thinks the additional will help. She is trying to get more  active but had trouble after 3-4 minutes. Also feels congested on left side all the time.  Download reviewed. Discussed management of rhinitis, which may be vasomotor.  Suggested that with caution she could try using a little Afrin as she continues saline rinse and Flonase. Atrial fibrillation has been treated.  02/09/22- -81 year old female never smoker followed for OSA, allergic rhinitis, asthma/ Nucala, Hyper IgE, complicated by PA.FibXarelto, HBP, GERD, obesity, Anemia,  -Neb Pulmicort/  Duoneb   Anoro, Flonase, Zyrtec, Singulair,  CPAP 9/Adapt Download- compliance  Body weight today-  Covid vax- 5 Moderna Flu vax-had LOV 10/16, Walsh, NP- augmentin and prednisone for sinusitis/ asthma exacerb started in Sept.    CT maxfac 01/19/22- IMPRESSION: Negative for sinusitis or sinus outflow obstruction. CXR 01/13/22- MPRESSION: Bronchitic changes without focal airspace opacity    ROS-see HPI   + = positive Constitutional:   No-   weight loss, night sweats, fevers, chills, fatigue, lassitude. HEENT:   No-  headaches, difficulty swallowing, tooth/dental problems, sore throat,       No-  sneezing, itching,  ear ache,  +nasal congestion, post nasal drip,  CV:  No-   chest pain, orthopnea, PND, swelling in lower extremities, anasarca, dizziness, palpitations Resp: +  shortness of breath with exertion or at rest.                +productive cough,  + non-productive cough,  No- coughing up of blood.              No-change in color of mucus. + wheezing.   Skin: No-   rash or lesions. GI:  No-   heartburn, indigestion, abdominal pain, nausea, vomiting, GU:  MS:  No-   joint pain or swelling. . Neuro-     nothing unusual Psych:  No- change in mood or affect. No depression or anxiety.  No memory loss.  OBJ- Physical Exam General- Alert, Oriented, Affect-appropriate, Distress-  none acute, + obese Skin- rash-none, lesions- none, excoriation- none Lymphadenopathy- none Head- atraumatic             Eyes- Gross vision intact, PERRLA, conjunctivae and secretions clear            Ears- Hearing, canals-normal            Nose- No turbinate edema, no-Septal dev, mucus, polyps, erosion, perforation             Throat- Mallampati III-IV , mucosa- not red,  drainage- none, tonsils- atrophic Neck- flexible , trachea midline, no stridor , thyroid nl, carotid no bruit Chest - symmetrical excursion , unlabored           Heart/CV- RR/ very faint (no pacemaker) , no murmur , no gallop  , no rub, nl  s1 s2                  - JVD- none , edema- none, stasis changes- none, varices- none           Lung- + clear  Wheeze-none , cough-none,                         dullness-none, rub- none           Chest wall-  Abd- Br/ Gen/ Rectal- Not done, not indicated Extrem- cyanosis- none, clubbing, none, atrophy- none, strength- nl Neuro- grossly intact to observation

## 2022-02-09 ENCOUNTER — Ambulatory Visit (INDEPENDENT_AMBULATORY_CARE_PROVIDER_SITE_OTHER): Payer: Medicare Other | Admitting: Internal Medicine

## 2022-02-09 ENCOUNTER — Encounter: Payer: Self-pay | Admitting: Internal Medicine

## 2022-02-09 VITALS — BP 128/62 | HR 90 | Temp 97.8°F | Ht 61.0 in | Wt 198.4 lb

## 2022-02-09 DIAGNOSIS — J3089 Other allergic rhinitis: Secondary | ICD-10-CM | POA: Diagnosis not present

## 2022-02-09 DIAGNOSIS — G4733 Obstructive sleep apnea (adult) (pediatric): Secondary | ICD-10-CM | POA: Diagnosis not present

## 2022-02-09 DIAGNOSIS — I4811 Longstanding persistent atrial fibrillation: Secondary | ICD-10-CM

## 2022-02-09 DIAGNOSIS — J302 Other seasonal allergic rhinitis: Secondary | ICD-10-CM | POA: Diagnosis not present

## 2022-02-09 DIAGNOSIS — R931 Abnormal findings on diagnostic imaging of heart and coronary circulation: Secondary | ICD-10-CM | POA: Diagnosis not present

## 2022-02-09 NOTE — Patient Instructions (Signed)
We can continue CPAP 9  I hope this ablation works  [place] to continue your regimen or your nose with flonase, sinus rinses and astelin.  Occasionally you can use a puff of Afrin if needed, but not more than once a day to avoid rebound stuffiness from that.  The antibiotic you got was augmentin

## 2022-02-09 NOTE — Assessment & Plan Note (Signed)
Ending another ablation attempt followed by cardiology

## 2022-02-09 NOTE — Assessment & Plan Note (Signed)
Benefits from CPAP with good compliance and control Plan-continue CPAP 9

## 2022-02-09 NOTE — Assessment & Plan Note (Signed)
She finds that maintaining a regular regimen of sinus rinse, Flonase and Astelin helps.  We discussed occasional very conservative use of Afrin if necessary.

## 2022-02-11 ENCOUNTER — Ambulatory Visit (INDEPENDENT_AMBULATORY_CARE_PROVIDER_SITE_OTHER): Payer: Medicare Other | Admitting: *Deleted

## 2022-02-11 ENCOUNTER — Ambulatory Visit: Payer: Medicare Other | Admitting: Podiatry

## 2022-02-11 VITALS — BP 146/58 | HR 82 | Temp 97.8°F | Resp 16 | Ht 61.0 in | Wt 198.2 lb

## 2022-02-11 DIAGNOSIS — J4520 Mild intermittent asthma, uncomplicated: Secondary | ICD-10-CM

## 2022-02-11 MED ORDER — MEPOLIZUMAB 100 MG ~~LOC~~ SOLR
100.0000 mg | Freq: Once | SUBCUTANEOUS | Status: AC
  Administered 2022-02-11: 100 mg via SUBCUTANEOUS
  Filled 2022-02-11: qty 1

## 2022-02-11 NOTE — Progress Notes (Signed)
Diagnosis: Asthma  Provider:  Praveen Mannam MD  Procedure: Injection  Nucala (Mepolizumab), Dose: 100 mg, Site: subcutaneous, Number of injections: 1  Post Care: Observation period completed  Discharge: Condition: Good, Destination: Home . AVS provided to patient.   Performed by:  Carrie Usery A, RN       

## 2022-02-16 ENCOUNTER — Ambulatory Visit (INDEPENDENT_AMBULATORY_CARE_PROVIDER_SITE_OTHER): Payer: Medicare Other

## 2022-02-16 DIAGNOSIS — I495 Sick sinus syndrome: Secondary | ICD-10-CM

## 2022-02-16 LAB — CUP PACEART REMOTE DEVICE CHECK
Date Time Interrogation Session: 20231119232045
Implantable Pulse Generator Implant Date: 20220826

## 2022-02-17 DIAGNOSIS — Z299 Encounter for prophylactic measures, unspecified: Secondary | ICD-10-CM | POA: Diagnosis not present

## 2022-02-17 DIAGNOSIS — L97519 Non-pressure chronic ulcer of other part of right foot with unspecified severity: Secondary | ICD-10-CM | POA: Diagnosis not present

## 2022-02-17 DIAGNOSIS — I10 Essential (primary) hypertension: Secondary | ICD-10-CM | POA: Diagnosis not present

## 2022-02-17 DIAGNOSIS — Z6835 Body mass index (BMI) 35.0-35.9, adult: Secondary | ICD-10-CM | POA: Diagnosis not present

## 2022-02-17 DIAGNOSIS — Z713 Dietary counseling and surveillance: Secondary | ICD-10-CM | POA: Diagnosis not present

## 2022-02-24 ENCOUNTER — Ambulatory Visit (INDEPENDENT_AMBULATORY_CARE_PROVIDER_SITE_OTHER): Payer: Medicare Other | Admitting: Podiatry

## 2022-02-24 DIAGNOSIS — L97511 Non-pressure chronic ulcer of other part of right foot limited to breakdown of skin: Secondary | ICD-10-CM | POA: Diagnosis not present

## 2022-02-24 DIAGNOSIS — R931 Abnormal findings on diagnostic imaging of heart and coronary circulation: Secondary | ICD-10-CM

## 2022-02-24 DIAGNOSIS — L03031 Cellulitis of right toe: Secondary | ICD-10-CM

## 2022-02-24 DIAGNOSIS — L02611 Cutaneous abscess of right foot: Secondary | ICD-10-CM | POA: Diagnosis not present

## 2022-02-24 NOTE — Progress Notes (Signed)
Subjective: Chief Complaint  Patient presents with   Foot Ulcer    Patient came in today for a ulcer on the right foot 2nd toe, patient is having some pain, TX: doxycyline(19 days) patient also has questions about her orthotics    81 year old female presents the office today with above concerns.  She states that the right second toe started 4 weeks ago.  She has recent concerns doxycycline she is about to finish the course of antibiotics.  No drainage or pus that she reports.  Still some swelling or redness but seems to be much better since starting antibiotics.  The orthotics would hurt about 4-5 days then she would have to switch them. She got new shoes to put the orthotics in and that helped.   It feels like the feet are 'drawing up".  She also feels unblanced. She has a hisotyr of hip surgery and left foot surery and she feels that she is "pulling in".    Objective: AAO x3, NAD DP/PT pulses palpable bilaterally, CRT less than 3 seconds On dorsal aspect of right second toe is some less edema and erythema.  No drainage or pus.  No fluctuation or crepitation.  There is no malodor.  Small superficial area of skin breakdown.  There is no probing to bone, no exposed bone or tendon. No other areas of discomfort. No pain with calf compression, swelling, warmth, erythema  Assessment: Right second toe ulceration with resolving cellulitis  Plan: -All treatment options discussed with the patient including all alternatives, risks, complications.  -At this point seems that the infection has improved.  She has not finished the course of antibiotics.  Recommend antibiotic ointment dressing changes daily.  Continue daily dressing changes.  Continue offloading at all times. -We discussed continued break in the orthotics with and also modify them as well if needed.  We also discussed physical therapy to help with gait. -Patient encouraged to call the office with any questions, concerns, change in  symptoms.    [name]

## 2022-02-24 NOTE — Patient Instructions (Signed)
Continue the antibiotic ointment and bandage daily. Finish course of antibiotics.  Monitor for any signs/symptoms of infection. Call the office immediately if any occur or go directly to the emergency room. Call with any questions/concerns.

## 2022-02-25 DIAGNOSIS — I10 Essential (primary) hypertension: Secondary | ICD-10-CM | POA: Diagnosis not present

## 2022-02-27 DIAGNOSIS — M9902 Segmental and somatic dysfunction of thoracic region: Secondary | ICD-10-CM | POA: Diagnosis not present

## 2022-02-27 DIAGNOSIS — M546 Pain in thoracic spine: Secondary | ICD-10-CM | POA: Diagnosis not present

## 2022-02-27 DIAGNOSIS — M542 Cervicalgia: Secondary | ICD-10-CM | POA: Diagnosis not present

## 2022-02-27 DIAGNOSIS — M79672 Pain in left foot: Secondary | ICD-10-CM | POA: Diagnosis not present

## 2022-02-27 DIAGNOSIS — M48061 Spinal stenosis, lumbar region without neurogenic claudication: Secondary | ICD-10-CM | POA: Diagnosis not present

## 2022-02-27 DIAGNOSIS — M9901 Segmental and somatic dysfunction of cervical region: Secondary | ICD-10-CM | POA: Diagnosis not present

## 2022-02-27 DIAGNOSIS — M9905 Segmental and somatic dysfunction of pelvic region: Secondary | ICD-10-CM | POA: Diagnosis not present

## 2022-02-27 DIAGNOSIS — M9903 Segmental and somatic dysfunction of lumbar region: Secondary | ICD-10-CM | POA: Diagnosis not present

## 2022-02-27 DIAGNOSIS — M79671 Pain in right foot: Secondary | ICD-10-CM | POA: Diagnosis not present

## 2022-03-02 ENCOUNTER — Ambulatory Visit: Payer: Medicare Other | Admitting: Podiatry

## 2022-03-02 ENCOUNTER — Encounter (INDEPENDENT_AMBULATORY_CARE_PROVIDER_SITE_OTHER): Payer: Self-pay | Admitting: Gastroenterology

## 2022-03-02 ENCOUNTER — Ambulatory Visit (INDEPENDENT_AMBULATORY_CARE_PROVIDER_SITE_OTHER): Payer: Medicare Other | Admitting: Gastroenterology

## 2022-03-02 VITALS — BP 128/74 | HR 81 | Temp 97.7°F | Ht 61.0 in | Wt 195.6 lb

## 2022-03-02 DIAGNOSIS — R058 Other specified cough: Secondary | ICD-10-CM

## 2022-03-02 DIAGNOSIS — K219 Gastro-esophageal reflux disease without esophagitis: Secondary | ICD-10-CM

## 2022-03-02 MED ORDER — OMEPRAZOLE 40 MG PO CPDR: 40.0000 mg | DELAYED_RELEASE_CAPSULE | Freq: Two times a day (BID) | ORAL | 1 refills | Status: DC

## 2022-03-02 NOTE — Progress Notes (Signed)
Referring Provider: Monico Blitz, MD Primary Care Physician:  Monico Blitz, MD Primary GI Physician: Jenetta Downer   Chief Complaint  Patient presents with   Gastroesophageal Reflux    Follow up on GERD. Concerns about cough she has after eating or drinking.    HPI:   [name] [name] is a 81 y.o. female with past medical history of anxiety, asthma, A flutter, COPD, depression, HTN, HLD, OSA.    Patient presenting today for follow up of GERD.   Last seen March 2023, at that time taking pantoprazole '40mg'$  once daily and famotidine '20mg'$  QHS PRN, has noticed a lot of coughing after eating with some thick phlegm she coughs up. has not had much issue with heartburn for the past few weeks but has had some acid regurgitation. She does endorse a sore throat at times though she does have a lot of sinus drainage. Denies any issues with dysphagia or odynophagia. using tums almost after every meal which seems to help with her symptoms. She is on budesonide every morning, she is using Flonase and a sinus rinse. She does report that she is on Tikosyn so she is unable to take a lot of medications due to potential interactions with Tikosyn.   Recommended stopping protonix, start omeprazole '40mg'$  once daily, continue famotidine '20mg'$  PRN QHS, potential repeat colonoscopy in march 2024.  Present: She reprots that back in the summer she was having a lot of coughing with eating and coughing up a lot of phlegm, she started taking a probiotic which has helped some. She notes that she still occasionally have some coughing after she eats, at times will occur during her meal. Sometimes cold stuff and dry foods like crackers will bring it on. Feels that coughing now occurs mostly at night. She reports that she often sits back in her recliner after eating even though she knows she shouldnt. Did not feel much improvement with switch from protonix to omeprazole. She is trying to avoid trigger foods. She has rare heartburn,  though the last few weeks she has had more acid regurgitation. She is taking pepcid PRN, though taking tums daily at this time. She has had some sore throats but also a lot of issues with her sinuses as well so unsure if this is from that or GERD. Denies dysphagia or odynophagia. Has occasional gas pains in lower abdomen improved with passing flatus. Denies rectal bleeding or melena. Bowels move very regularly. No changes in appetite or weight loss. Has an upcoming cardiac ablation in December which will be her third one.   Last colonoscopy: 06/09/12 Examination performed to cecum. Few small diverticula at sigmoid colon. Small external hemorrhoids.  Last Endoscopy:08/27/16- Multiple whitish plaques in the upper third of the esophagus. Brushing obtained for KOH. - Normal middle third of esophagus, lower third of esophagus and gastroesophageal junction. - Z-line regular, 39 cm from the incisors. - 2 cm hiatal hernia. - Multiple gastric polyps. Four biopsied. - Normal duodenal bulb and second portion of the duodenum.  Recommendations:  Repeat TCS March 2024  Past Medical History:  Diagnosis Date   Allergic rhinitis    Anxiety    Asthma    Atrial flutter (HCC)    COPD (chronic obstructive pulmonary disease) ([place])    Depression    Essential hypertension    Hyperlipidemia    Lumbar disc disease    Obesity    OSA (obstructive sleep apnea)    CPAP   Paroxysmal atrial fibrillation ([place])  Element of tachycardia bradycardia syndrome   Respiratory failure ([employer])     Past Surgical History:  Procedure Laterality Date   ANTERIOR FUSION LUMBAR SPINE     ATRIAL FIBRILLATION ABLATION N/A 09/12/2020   Procedure: ATRIAL FIBRILLATION ABLATION;  Surgeon: Thompson Grayer, MD;  Location: [place] [facility];  Service: Cardiovascular;  Laterality: N/A;   BIOPSY  08/27/2016   Procedure: BIOPSY;  Surgeon: Rogene Houston, MD;  [facility];  Service: Endoscopy;;  FOUR GASTRIC POLYPS BIOPSIED    CARDIOVERSION N/A 03/28/2014   Procedure: CARDIOVERSION;  Surgeon: Fay Records, MD;  Location: [facility];  Service: Cardiovascular;  Laterality: N/A;   CARDIOVERSION N/A 10/22/2016   Procedure: CARDIOVERSION;  Surgeon: Sueanne Margarita, MD;  Location[facility] END[facility];  Service: Cardiovascular;  Laterality: N/A;   CARDIOVERSION N/A 11/26/2020   Procedure: CARDIOVERSION;  Surgeon: Fay Records, MD;  Location: [facility];  Service: Cardiovascular;  Laterality: N/A;   CATARACT EXTRACTION     COLONOSCOPY N/A 08/04/2012   Procedure: COLONOSCOPY;  Surgeon: Rogene Houston, MD;  [facility];  Service: Endoscopy;  Laterality: N/A;  730-rescheduled to [facility] notified pt   ELECTROPHYSIOLOGIC STUDY N/A 09/18/2014   Procedure: Atrial Fibrillation Ablation;  Surgeon: Thompson Grayer, MD;  Location: [place] [facility];  Service: Cardiovascular;  Laterality: N/A;   ESOPHAGOGASTRODUODENOSCOPY N/A 08/27/2016   Procedure: ESOPHAGOGASTRODUODENOSCOPY (EGD);  Surgeon: Rogene Houston, MD;  [facility];  Service: Endoscopy;  Laterality: N/A;  1200   implantable loop recorder placement  11/22/2020   Medtronic Reveal Linq model LNQ 11 (SN [id number]) implantable loop recorder   LASIK     Both eyes   TEE WITHOUT CARDIOVERSION N/A 09/18/2014   Procedure: TRANSESOPHAGEAL ECHOCARDIOGRAM (TEE);  Surgeon: Larey Dresser, MD;  Location: [place];  Service: Cardiovascular;  Laterality: N/A;   TEE WITHOUT CARDIOVERSION N/A 10/22/2016   Procedure: TRANSESOPHAGEAL ECHOCARDIOGRAM (TEE);  Surgeon: Sueanne Margarita, MD;  Location: [facility] ENDOSCOPY;  Service: Cardiovascular;  Laterality: N/A;   TEE WITHOUT CARDIOVERSION N/A 08/24/2017   Procedure: TRANSESOPHAGEAL ECHOCARDIOGRAM (TEE);  Surgeon: Josue Hector, MD;  Location: [facility] ENDOSCOPY;  Service: Cardiovascular;  Laterality: N/A;   TOTAL HIP ARTHROPLASTY  03/30/2008   Right    Current Outpatient Medications  Medication Sig Dispense Refill    antiseptic oral rinse (BIOTENE) LIQD 15 mLs by Mouth Rinse route daily as needed for dry mouth.     atorvastatin (LIPITOR) 10 MG tablet Take 10 mg by mouth daily at 6 PM.     azelastine (ASTELIN) 0.1 % nasal spray Place 2 sprays into both nostrils 2 (two) times daily. Use in each nostril as directed 30 mL 11   benzonatate (TESSALON) 200 MG capsule Take 1 capsule (200 mg total) by mouth 3 (three) times daily as needed for cough. 30 capsule 3   Biotin 1 MG CAPS Take 1,000 mg by mouth 3 (three) times a week.      budesonide (PULMICORT) 0.5 MG/2ML nebulizer solution USE ONE vial via NEBULIZER TWICE DAILY DX CODEJ44.9     Calcium Carbonate-Vit D-Min (QC CALCIUM-MAGNESIUM-ZINC-D3) 333.4-133 MG-UNIT TABS Take 1 tablet by mouth 2 (two) times daily.      cetirizine (ZYRTEC) 10 MG tablet Take 10 mg by mouth at bedtime as needed for allergies.     clindamycin (CLEOCIN) 300 MG capsule Take 600 mg by mouth See admin instructions. Take prior to dental procedures  0   diltiazem (CARDIZEM CD) 120  MG 24 hr capsule TAKE ONE CAPSULE BY MOUTH in THE morning AND ONE capsule in THE evening (Patient taking differently: Take 120 mg by mouth at bedtime.) 180 capsule 1   diltiazem (CARDIZEM CD) 240 MG 24 hr capsule TAKE ONE CAPSULE BY MOUTH EVERY MORNING 30 capsule 5   diltiazem (CARDIZEM) 30 MG tablet TAKE 1 TABLET BY MOUTH AS NEEDED FOR PALPITATIONS 45 tablet 2   dofetilide (TIKOSYN) 500 MCG capsule TAKE ONE CAPSULE BY MOUTH TWICE A DAY 180 capsule 1   EPINEPHrine 0.3 mg/0.3 mL IJ SOAJ injection Inject 0.3 mg into the muscle as needed for anaphylaxis.     famotidine (PEPCID) 20 MG tablet Take 2 tablets (40 mg total) by mouth at bedtime as needed. 30 tablet 1   fluticasone (FLONASE) 50 MCG/ACT nasal spray Place 1 spray into both nostrils 2 (two) times daily.     hydrALAZINE (APRESOLINE) 100 MG tablet Take 100 mg by mouth 2 (two) times daily.     HYDROcodone bit-homatropine (HYCODAN) 5-1.5 MG/5ML syrup Take 5 mLs by mouth  every 6 (six) hours as needed for cough.     Hypertonic Nasal Wash (SINUS RINSE) PACK Place 1 each into the nose daily as needed (clear nasal passage). Neilmed sinus rinse     ipratropium-albuterol (DUONEB) 0.5-2.5 (3) MG/3ML SOLN Take 3 mLs by nebulization 4 (four) times daily as needed (Shortness of breathing).     losartan (COZAAR) 100 MG tablet TAKE 1 TABLET BY MOUTH DAILY 90 tablet 3   Magnesium 250 MG TABS Take 250 mg by mouth daily.     montelukast (SINGULAIR) 10 MG tablet Take 10 mg by mouth at bedtime.     omeprazole (PRILOSEC) 40 MG capsule TAKE ONE CAPSULE BY MOUTH EVERY DAY 60 capsule 2   potassium chloride SA (KLOR-CON M) 20 MEQ tablet TAKE 1 TABLET BY MOUTH DAILY 90 tablet 3   saccharomyces boulardii (FLORASTOR) 250 MG capsule Take 250 mg by mouth daily.     umeclidinium-vilanterol (ANORO ELLIPTA) 62.5-25 MCG/ACT AEPB INHALE ONE PUFF INTO THE LUNGS DAILY (Patient taking differently: 1 puff every morning.) 60 each 11   Wheat Dextrin (BENEFIBER ON THE GO) POWD Take 10 mLs by mouth daily.     XARELTO 20 MG TABS tablet TAKE 1 TABLET BY MOUTH DAILY with SUPPER 90 tablet 1   No current facility-administered medications for this visit.    Allergies as of 03/02/2022 - Review Complete 03/02/2022  Allergen Reaction Noted   Bupropion hcl Other (See Comments)    Escitalopram oxalate Other (See Comments)    Fluticasone-salmeterol Other (See Comments)    Lisinopril Cough    Serevent Other (See Comments) 05/06/2011    Family History  Problem Relation Age of Onset   Cancer Mother    Heart attack Father    Colon cancer Other     Social History   Socioeconomic History   Marital status: Single    Spouse name: Not on file   Number of children: Not on file   Years of education: Not on file   Highest education level: Not on file  Occupational History   Occupation: Retired: Presenter, broadcasting  Tobacco Use   Smoking status: Never    Passive exposure: Past   Smokeless tobacco: Never   Vaping Use   Vaping Use: Never used  Substance and Sexual Activity   Alcohol use: No    Alcohol/week: 0.0 standard drinks of alcohol   Drug use: No   Sexual activity: Not  on file  Other Topics Concern   Not on file  Social History Narrative   Pt lives in [place] alone. She was never married.   Retired Customer service manager.   Attends [employer]   Social Determinants of Health   Financial Resource Strain: Not on file  Food Insecurity: Not on file  Transportation Needs: Not on file  Physical Activity: Not on file  Stress: Not on file  Social Connections: Not on file   Review of systems General: negative for malaise, night sweats, fever, chills, weight loss Neck: Negative for lumps, goiter, pain and significant neck swelling Resp: Negative for cough, wheezing, dyspnea at rest CV: Negative for chest pain, leg swelling, palpitations, orthopnea GI: denies melena, hematochezia, nausea, vomiting, diarrhea, constipation, dysphagia, odyonophagia, early satiety or unintentional weight loss. +GERD symptoms +coughing after eating MSK: Negative for joint pain or swelling, back pain, and muscle pain. Derm: Negative for itching or rash Psych: Denies depression, anxiety, memory loss, confusion. No homicidal or suicidal ideation.  Heme: Negative for prolonged bleeding, bruising easily, and swollen nodes. Endocrine: Negative for cold or heat intolerance, polyuria, polydipsia and goiter. Neuro: negative for tremor, gait imbalance, syncope and seizures. The remainder of the review of systems is noncontributory.  Physical Exam: BP 128/74 (BP Location: Left Arm, Patient Position: Sitting, Cuff Size: Large)   Pulse 81   Temp 97.7 F (36.5 C) (Oral)   Ht '5\' 1"'$  (1.549 m)   Wt 195 lb 9.6 oz (88.7 kg)   BMI 36.96 kg/m  General:   Alert and oriented. No distress noted. Pleasant and cooperative.  Head:  Normocephalic and atraumatic. Eyes:  Conjuctiva clear without scleral icterus. Mouth:   Oral mucosa pink and moist. Good dentition. No lesions. Heart: Normal rate and rhythm, s1 and s2 heart sounds present.  Lungs: Clear lung sounds in all lobes. Respirations equal and unlabored. Abdomen:  +BS, soft, non-tender and non-distended. No rebound or guarding. No HSM or masses noted. Derm: No palmar erythema or jaundice Msk:  Symmetrical without gross deformities. Normal posture. Extremities:  Without edema. Neurologic:  Alert and  oriented x4 Psych:  Alert and cooperative. Normal mood and affect.  Invalid input(s): "6 MONTHS"   ASSESSMENT: [name] [name] is a 81 y.o. female presenting today for GERD follow up  Patient continues to have acid regurgitation, and coughing after eating. Did not note much improvement with switch to omeprazole. She has no dysphagia or odynophagia. Given her ongoing symptoms, not well controlled with PPI therapy, I did recommend proceeding with EGD for further evaluation. Coughing certainly could be related to potential aspiration or spasm, though given she has notable acid regurgitation, could still be secondary to uncontrolled GERD. As she is having another cardiac ablation in December, EGD would have to be done after this with cardiology clearance. For now, we will increase PPI to BID dosing with instructions on strict reflux precautions. She should avoid frequent use of TUMS due to concern for potential hypercalcemia as she is taking these daily and on a calcium supplement as well. She can continue to use Pepcid PRN. Will plan to reassess symptoms in 3 months for potential EGD at that time or if symptoms worsen in the meantime, we can consider moving forward with EGD as long as things are stable from cardiac standpoint.  Last colonoscopy in 2014, we discussed given age and multimorbidities, risks of repeating this would likely outweigh benefits. will not proceed with any further screening colonoscopies.    PLAN:  Continue daily probiotic  2. Plan for  possible EGD after cardiac ablation  3. Continue Omeprazole '40mg'$ , increase to BID  4. Continue pepcid '20mg'$  PRN 5. Reflux precautions 6. Avoid frequent use of TUMS   All questions were answered, patient verbalized understanding and is in agreement with plan as outlined above.    Follow Up: 3 months   Von [place] L. Alver Sorrow, MSN, APRN, AGNP-C Adult-Gerontology Nurse Practitioner [facility] at [facility]  I have reviewed the note and agree with the APP's assessment as described in this progress note.  May eventually consider EGD for further evaluation and possible pH impedance testing if symptoms persist.  Maylon Peppers, MD Gastroenterology and [facility]

## 2022-03-02 NOTE — Patient Instructions (Signed)
It was nice to see you! Let's increase your omeprazole '40mg'$  to twice daily in an attempt to decrease tums usage as high doses can raise your calcium level You can continue to use pepcid as needed for breakthrough symptoms.   Avoid greasy, spicy, fried, citrus foods, and be mindful that caffeine, carbonated drinks, chocolate and alcohol can increase reflux symptoms Stay upright 2-3 hours after eating, prior to lying down and avoid eating late in the evenings.  I will see you back in 3 months, if symptoms are still persisting at that time, or worsen in the meantime, we can proceed with going ahead with upper endoscopy.

## 2022-03-03 DIAGNOSIS — N950 Postmenopausal bleeding: Secondary | ICD-10-CM | POA: Diagnosis not present

## 2022-03-05 DIAGNOSIS — I4891 Unspecified atrial fibrillation: Secondary | ICD-10-CM | POA: Diagnosis not present

## 2022-03-05 DIAGNOSIS — J441 Chronic obstructive pulmonary disease with (acute) exacerbation: Secondary | ICD-10-CM | POA: Diagnosis not present

## 2022-03-05 DIAGNOSIS — Z6836 Body mass index (BMI) 36.0-36.9, adult: Secondary | ICD-10-CM | POA: Diagnosis not present

## 2022-03-05 DIAGNOSIS — I10 Essential (primary) hypertension: Secondary | ICD-10-CM | POA: Diagnosis not present

## 2022-03-05 DIAGNOSIS — Z299 Encounter for prophylactic measures, unspecified: Secondary | ICD-10-CM | POA: Diagnosis not present

## 2022-03-06 ENCOUNTER — Telehealth: Payer: Self-pay | Admitting: Cardiovascular Disease

## 2022-03-06 NOTE — Telephone Encounter (Signed)
Returned call to patient. Reviewed pre-procedure instructions with the patient. She verbalized understanding and thanked me for the call.

## 2022-03-06 NOTE — Telephone Encounter (Signed)
Patient is following up. She states she has additional questions regarding 12/20 ablation with Dr. Myles Gip.

## 2022-03-09 ENCOUNTER — Other Ambulatory Visit (HOSPITAL_COMMUNITY)
Admission: RE | Admit: 2022-03-09 | Discharge: 2022-03-09 | Disposition: A | Discharge status: Home / Self Care | Payer: Medicare Other | Source: Ambulatory Visit | Attending: Cardiovascular Disease | Admitting: Cardiovascular Disease

## 2022-03-09 ENCOUNTER — Telehealth (HOSPITAL_COMMUNITY): Payer: Self-pay

## 2022-03-09 DIAGNOSIS — I4819 Other persistent atrial fibrillation: Secondary | ICD-10-CM | POA: Diagnosis not present

## 2022-03-09 LAB — CBC WITH DIFFERENTIAL/PLATELET
Abs Immature Granulocytes: 0.05 10*3/uL (ref 0.00–0.07)
Basophils Absolute: 0.1 10*3/uL (ref 0.0–0.1)
Basophils Relative: 1 %
Eosinophils Absolute: 0.1 10*3/uL (ref 0.0–0.5)
Eosinophils Relative: 1 %
HCT: 35.8 % — ABNORMAL LOW (ref 36.0–46.0)
Hemoglobin: 10.9 g/dL — ABNORMAL LOW (ref 12.0–15.0)
Immature Granulocytes: 1 %
Lymphocytes Relative: 21 %
Lymphs Abs: 1.5 10*3/uL (ref 0.7–4.0)
MCH: 24.9 pg — ABNORMAL LOW (ref 26.0–34.0)
MCHC: 30.4 g/dL (ref 30.0–36.0)
MCV: 81.7 fL (ref 80.0–100.0)
Monocytes Absolute: 1 10*3/uL (ref 0.1–1.0)
Monocytes Relative: 14 %
Neutro Abs: 4.6 10*3/uL (ref 1.7–7.7)
Neutrophils Relative %: 62 %
Platelets: 247 10*3/uL (ref 150–400)
RBC: 4.38 MIL/uL (ref 3.87–5.11)
RDW: 17 % — ABNORMAL HIGH (ref 11.5–15.5)
WBC: 7.3 10*3/uL (ref 4.0–10.5)
nRBC: 0 % (ref 0.0–0.2)

## 2022-03-09 LAB — BASIC METABOLIC PANEL
Anion gap: 8 (ref 5–15)
BUN: 20 mg/dL (ref 8–23)
CO2: 25 mmol/L (ref 22–32)
Calcium: 9.3 mg/dL (ref 8.9–10.3)
Chloride: 106 mmol/L (ref 98–111)
Creatinine, Ser: 1.14 mg/dL — ABNORMAL HIGH (ref 0.44–1.00)
GFR, Estimated: 48 mL/min — ABNORMAL LOW (ref >60)
Glucose, Bld: 107 mg/dL — ABNORMAL HIGH (ref 70–99)
Potassium: 3.9 mmol/L (ref 3.5–5.1)
Sodium: 139 mmol/L (ref 135–145)

## 2022-03-09 MED ORDER — DOFETILIDE 500 MCG PO CAPS: 500.0000 ug | ORAL_CAPSULE | Freq: Two times a day (BID) | ORAL | 1 refills | Status: DC

## 2022-03-09 NOTE — Telephone Encounter (Signed)
Patient called in needing refills on her Tikosyn '500mg'$  medication. Sent in her medication to [facility] pharmacy, 90 day supply with 1 refill. Patient notified.

## 2022-03-10 ENCOUNTER — Ambulatory Visit (INDEPENDENT_AMBULATORY_CARE_PROVIDER_SITE_OTHER): Payer: Medicare Other | Admitting: Podiatry

## 2022-03-10 ENCOUNTER — Ambulatory Visit: Payer: Medicare Other | Admitting: Podiatry

## 2022-03-10 VITALS — BP 138/78

## 2022-03-10 DIAGNOSIS — M79675 Pain in left toe(s): Secondary | ICD-10-CM | POA: Diagnosis not present

## 2022-03-10 DIAGNOSIS — B351 Tinea unguium: Secondary | ICD-10-CM

## 2022-03-10 DIAGNOSIS — R931 Abnormal findings on diagnostic imaging of heart and coronary circulation: Secondary | ICD-10-CM

## 2022-03-10 DIAGNOSIS — L97511 Non-pressure chronic ulcer of other part of right foot limited to breakdown of skin: Secondary | ICD-10-CM | POA: Diagnosis not present

## 2022-03-10 DIAGNOSIS — M79674 Pain in right toe(s): Secondary | ICD-10-CM | POA: Diagnosis not present

## 2022-03-11 ENCOUNTER — Telehealth (HOSPITAL_COMMUNITY): Payer: Self-pay | Admitting: Emergency Medicine

## 2022-03-11 ENCOUNTER — Ambulatory Visit (INDEPENDENT_AMBULATORY_CARE_PROVIDER_SITE_OTHER): Payer: Medicare Other

## 2022-03-11 ENCOUNTER — Telehealth (HOSPITAL_COMMUNITY): Payer: Self-pay | Admitting: *Deleted

## 2022-03-11 ENCOUNTER — Telehealth: Payer: Self-pay | Admitting: Pulmonary Disease

## 2022-03-11 VITALS — BP 131/66 | HR 86 | Temp 97.5°F | Resp 22 | Ht 61.0 in | Wt 199.0 lb

## 2022-03-11 DIAGNOSIS — J4520 Mild intermittent asthma, uncomplicated: Secondary | ICD-10-CM

## 2022-03-11 MED ORDER — MEPOLIZUMAB 100 MG ~~LOC~~ SOLR
100.0000 mg | Freq: Once | SUBCUTANEOUS | Status: AC
  Administered 2022-03-11: 100 mg via SUBCUTANEOUS
  Filled 2022-03-11: qty 1

## 2022-03-11 NOTE — Progress Notes (Signed)
Diagnosis: Asthma  Provider:  Marshell Garfinkel MD  Procedure: Injection  Nucala (Mepolizumab), Dose: 100 mg, Site: subcutaneous, Number of injections: 1  Post Care:  n/a  Discharge: Condition: Good, Destination: Home . AVS provided to patient.   Performed by:  Cleophus Molt, RN

## 2022-03-11 NOTE — Telephone Encounter (Signed)
Attempted to call patient regarding upcoming cardiac CT appointment. °Left message on voicemail with name and callback number ° °Tyrique Sporn RN Navigator Cardiac Imaging °[facility] °[phone] Office °[phone] Cell ° °

## 2022-03-11 NOTE — Telephone Encounter (Signed)
Reaching out to patient to offer assistance regarding upcoming cardiac imaging study; pt verbalizes understanding of appt date/time, parking situation and where to check in, pre-test NPO status and medications ordered, and verified current allergies; name and call back number provided for further questions should they arise [name] Bond RN Navigator Cardiac Imaging [facility] [phone] office [phone] cell  Arrival 845 Denies iv issues Daily meds, holding allergy meds

## 2022-03-11 NOTE — Telephone Encounter (Signed)
Pt called stating usually she gets a call from infusion before she arrives to insure meds are mixed and sent over from the hospital. No call as of yet. Pls call to confirm this was done. TY @ [phone]

## 2022-03-12 ENCOUNTER — Ambulatory Visit (HOSPITAL_BASED_OUTPATIENT_CLINIC_OR_DEPARTMENT_OTHER)
Admission: RE | Admit: 2022-03-12 | Discharge: 2022-03-12 | Disposition: A | Discharge status: Home / Self Care | Payer: Medicare Other | Source: Ambulatory Visit | Attending: Cardiovascular Disease | Admitting: Cardiovascular Disease

## 2022-03-12 DIAGNOSIS — I4819 Other persistent atrial fibrillation: Secondary | ICD-10-CM | POA: Diagnosis not present

## 2022-03-12 MED ORDER — IOHEXOL 350 MG/ML SOLN
100.0000 mL | Freq: Once | INTRAVENOUS | Status: AC | PRN
  Administered 2022-03-12: 80 mL via INTRAVENOUS

## 2022-03-16 ENCOUNTER — Other Ambulatory Visit: Payer: Self-pay

## 2022-03-16 DIAGNOSIS — I480 Paroxysmal atrial fibrillation: Secondary | ICD-10-CM

## 2022-03-16 MED ORDER — RIVAROXABAN 20 MG PO TABS: 20.0000 mg | ORAL_TABLET | Freq: Every day | ORAL | 3 refills | Status: DC

## 2022-03-16 NOTE — Telephone Encounter (Signed)
Prescription refill request for Xarelto received.  Indication: Afib  Last office visit: 01/27/22 ([facility])  Weight: 90.3kg Age: 81 Scr: 1.14 (03/09/22)  CrCl:  54.69m/min  Appropriate dose and refill sent ti requested pharmacy.

## 2022-03-17 NOTE — Progress Notes (Signed)
Subjective: Chief Complaint  Patient presents with   Foot Ulcer    Skin ulcer right foot. Nail trim      81 year old female presents the office today with above concerns.  She is also wound on the right foot but seems to getting better.  No drainage or pus.  She is finished course of antibiotics.  Denies any fevers or chills.  Objective: AAO x3, NAD DP/PT pulses palpable bilaterally, CRT less than 3 seconds On dorsal aspect of right second toe is some decreased but still some slight edema without any significant erythema.  No drainage or pus.  No fluctuation or crepitation.  There is no malodor.  Small superficial area of skin breakdown which is scabbed over..  There is no probing to bone, no exposed bone or tendon. Nails are hypertrophic, dystrophic, brittle, discolored, elongated 10. No surrounding redness or drainage. Tenderness nails 1-5 bilaterally. No open lesions or pre-ulcerative lesions are identified today. No pain with calf compression, swelling, warmth, erythema  Assessment: Right second toe ulceration, symptomatic onychomycosis  Plan: -All treatment options discussed with the patient including all alternatives, risks, complications.  -Sharply debrided the nails x 10 without any complications or bleeding. -On the right second toe there is callus with some dried blood present.  Upon debridement superficial area skin breakdown although minimal.  Monitor for any signs or symptoms of infection.  Subcu placement of antibiotic ointment dressing daily.  Overall she seems to be improving. -Monitor for any clinical signs or symptoms of infection and directed to call the office immediately should any occur or go to the ER.  Return in about 4 weeks (around 04/07/2022).  [name]

## 2022-03-17 NOTE — Pre-Procedure Instructions (Signed)
Instructed patient on the following items: Arrival time 0900 Nothing to eat or drink after midnight No meds AM of procedure Responsible person to drive you home and stay with you for 24 hrs  Have you missed any doses of anti-coagulant xarelto- hasn't missed any doses

## 2022-03-17 NOTE — Progress Notes (Signed)
Carelink Summary Report / Loop Recorder 

## 2022-03-18 ENCOUNTER — Ambulatory Visit (HOSPITAL_BASED_OUTPATIENT_CLINIC_OR_DEPARTMENT_OTHER): Payer: Medicare Other | Admitting: Certified Registered"

## 2022-03-18 ENCOUNTER — Ambulatory Visit (HOSPITAL_COMMUNITY)
Admission: RE | Admit: 2022-03-18 | Discharge: 2022-03-18 | Disposition: A | Discharge status: Home / Self Care | Payer: Medicare Other | Attending: Cardiovascular Disease | Admitting: Cardiovascular Disease

## 2022-03-18 ENCOUNTER — Encounter (HOSPITAL_COMMUNITY): Payer: Self-pay | Admitting: Cardiovascular Disease

## 2022-03-18 ENCOUNTER — Ambulatory Visit (HOSPITAL_COMMUNITY): Payer: Medicare Other | Admitting: Certified Registered"

## 2022-03-18 ENCOUNTER — Other Ambulatory Visit: Payer: Self-pay

## 2022-03-18 ENCOUNTER — Encounter (HOSPITAL_COMMUNITY)
Admission: RE | Disposition: A | Discharge status: Home / Self Care | Payer: Self-pay | Source: Home / Self Care | Attending: Cardiovascular Disease

## 2022-03-18 DIAGNOSIS — E669 Obesity, unspecified: Secondary | ICD-10-CM | POA: Insufficient documentation

## 2022-03-18 DIAGNOSIS — J449 Chronic obstructive pulmonary disease, unspecified: Secondary | ICD-10-CM | POA: Insufficient documentation

## 2022-03-18 DIAGNOSIS — I10 Essential (primary) hypertension: Secondary | ICD-10-CM

## 2022-03-18 DIAGNOSIS — I2510 Atherosclerotic heart disease of native coronary artery without angina pectoris: Secondary | ICD-10-CM | POA: Insufficient documentation

## 2022-03-18 DIAGNOSIS — K219 Gastro-esophageal reflux disease without esophagitis: Secondary | ICD-10-CM | POA: Insufficient documentation

## 2022-03-18 DIAGNOSIS — I4819 Other persistent atrial fibrillation: Secondary | ICD-10-CM | POA: Insufficient documentation

## 2022-03-18 DIAGNOSIS — I484 Atypical atrial flutter: Secondary | ICD-10-CM

## 2022-03-18 DIAGNOSIS — G4730 Sleep apnea, unspecified: Secondary | ICD-10-CM | POA: Insufficient documentation

## 2022-03-18 DIAGNOSIS — Z6835 Body mass index (BMI) 35.0-35.9, adult: Secondary | ICD-10-CM | POA: Insufficient documentation

## 2022-03-18 DIAGNOSIS — I4891 Unspecified atrial fibrillation: Secondary | ICD-10-CM

## 2022-03-18 HISTORY — PX: ATRIAL FIBRILLATION ABLATION: EP1191

## 2022-03-18 LAB — POCT ACTIVATED CLOTTING TIME
Activated Clotting Time: 282 seconds
Activated Clotting Time: 288 seconds
Activated Clotting Time: 298 seconds

## 2022-03-18 SURGERY — ATRIAL FIBRILLATION ABLATION
Anesthesia: General

## 2022-03-18 MED ORDER — ROCURONIUM BROMIDE 10 MG/ML (PF) SYRINGE
PREFILLED_SYRINGE | INTRAVENOUS | Status: DC | PRN
  Administered 2022-03-18: 60 mg via INTRAVENOUS

## 2022-03-18 MED ORDER — ONDANSETRON HCL 4 MG/2ML IJ SOLN
INTRAMUSCULAR | Status: AC
  Filled 2022-03-18: qty 2

## 2022-03-18 MED ORDER — HEPARIN SODIUM (PORCINE) 1000 UNIT/ML IJ SOLN
INTRAMUSCULAR | Status: AC
  Filled 2022-03-18: qty 10

## 2022-03-18 MED ORDER — ONDANSETRON HCL 4 MG/2ML IJ SOLN
4.0000 mg | Freq: Four times a day (QID) | INTRAMUSCULAR | Status: DC | PRN
  Administered 2022-03-18: 4 mg via INTRAVENOUS

## 2022-03-18 MED ORDER — HEPARIN SODIUM (PORCINE) 1000 UNIT/ML IJ SOLN
INTRAMUSCULAR | Status: DC | PRN
  Administered 2022-03-18: 1000 [IU] via INTRAVENOUS

## 2022-03-18 MED ORDER — PROPOFOL 10 MG/ML IV BOLUS
INTRAVENOUS | Status: DC | PRN
  Administered 2022-03-18: 120 mg via INTRAVENOUS

## 2022-03-18 MED ORDER — SODIUM CHLORIDE 0.9% FLUSH: 3.0000 mL | Freq: Two times a day (BID) | INTRAVENOUS | Status: DC

## 2022-03-18 MED ORDER — FENTANYL CITRATE (PF) 250 MCG/5ML IJ SOLN
INTRAMUSCULAR | Status: DC | PRN
  Administered 2022-03-18 (×2): 50 ug via INTRAVENOUS

## 2022-03-18 MED ORDER — SODIUM CHLORIDE 0.9% FLUSH: 3.0000 mL | INTRAVENOUS | Status: DC | PRN

## 2022-03-18 MED ORDER — HEPARIN SODIUM (PORCINE) 1000 UNIT/ML IJ SOLN
INTRAMUSCULAR | Status: DC | PRN
  Administered 2022-03-18: 2000 [IU] via INTRAVENOUS
  Administered 2022-03-18: 12000 [IU] via INTRAVENOUS
  Administered 2022-03-18: 2000 [IU] via INTRAVENOUS

## 2022-03-18 MED ORDER — SUGAMMADEX SODIUM 200 MG/2ML IV SOLN
INTRAVENOUS | Status: DC | PRN
  Administered 2022-03-18: 200 mg via INTRAVENOUS

## 2022-03-18 MED ORDER — ACETAMINOPHEN 325 MG PO TABS
ORAL_TABLET | ORAL | Status: AC
  Filled 2022-03-18: qty 2

## 2022-03-18 MED ORDER — HEPARIN (PORCINE) IN NACL 1000-0.9 UT/500ML-% IV SOLN
INTRAVENOUS | Status: AC
  Filled 2022-03-18: qty 500

## 2022-03-18 MED ORDER — SODIUM CHLORIDE 0.9 % IV SOLN: INTRAVENOUS | Status: DC

## 2022-03-18 MED ORDER — SODIUM CHLORIDE 0.9 % IV SOLN: 250.0000 mL | INTRAVENOUS | Status: DC | PRN

## 2022-03-18 MED ORDER — ISOPROTERENOL HCL 0.2 MG/ML IJ SOLN
INTRAMUSCULAR | Status: AC
  Filled 2022-03-18: qty 5

## 2022-03-18 MED ORDER — PROTAMINE SULFATE 10 MG/ML IV SOLN
INTRAVENOUS | Status: DC | PRN
  Administered 2022-03-18: 50 mg via INTRAVENOUS

## 2022-03-18 MED ORDER — DEXAMETHASONE SODIUM PHOSPHATE 10 MG/ML IJ SOLN
INTRAMUSCULAR | Status: DC | PRN
  Administered 2022-03-18: 10 mg via INTRAVENOUS

## 2022-03-18 MED ORDER — PHENYLEPHRINE HCL-NACL 20-0.9 MG/250ML-% IV SOLN
INTRAVENOUS | Status: DC | PRN
  Administered 2022-03-18: 25 ug/min via INTRAVENOUS

## 2022-03-18 MED ORDER — HEPARIN (PORCINE) IN NACL 1000-0.9 UT/500ML-% IV SOLN
INTRAVENOUS | Status: DC | PRN
  Administered 2022-03-18 (×3): 500 mL

## 2022-03-18 MED ORDER — ISOPROTERENOL HCL 0.2 MG/ML IJ SOLN
INTRAVENOUS | Status: DC | PRN
  Administered 2022-03-18: 2 ug/min via INTRAVENOUS

## 2022-03-18 MED ORDER — LIDOCAINE 2% (20 MG/ML) 5 ML SYRINGE
INTRAMUSCULAR | Status: DC | PRN
  Administered 2022-03-18: 40 mg via INTRAVENOUS

## 2022-03-18 MED ORDER — COLCHICINE 0.6 MG PO TABS: 0.3000 mg | ORAL_TABLET | Freq: Two times a day (BID) | ORAL | 0 refills | Status: DC

## 2022-03-18 MED ORDER — ONDANSETRON HCL 4 MG/2ML IJ SOLN
INTRAMUSCULAR | Status: DC | PRN
  Administered 2022-03-18: 4 mg via INTRAVENOUS

## 2022-03-18 MED ORDER — ACETAMINOPHEN 325 MG PO TABS
650.0000 mg | ORAL_TABLET | ORAL | Status: DC | PRN
  Administered 2022-03-18: 650 mg via ORAL

## 2022-03-18 SURGICAL SUPPLY — 18 items
CATH ABLAT QDOT MICRO BI TC DF (CATHETERS) IMPLANT
CATH OCTARAY 2.0 F 3-3-3-3-3 (CATHETERS) IMPLANT
CATH S-M CIRCA TEMP PROBE (CATHETERS) IMPLANT
CATH SOUNDSTAR ECO 8FR (CATHETERS) IMPLANT
CATH WEB BI DIR CSDF CRV REPRO (CATHETERS) IMPLANT
CLOSURE PERCLOSE PROSTYLE (VASCULAR PRODUCTS) IMPLANT
COVER SWIFTLINK CONNECTOR (BAG) ×1 IMPLANT
DEVICE CLOSURE MYNXGRIP 6/7F (Vascular Products) IMPLANT
KIT VERSACROSS STEERABLE 180 (KITS) IMPLANT
PACK EP LATEX FREE (CUSTOM PROCEDURE TRAY) ×1
PACK EP LF (CUSTOM PROCEDURE TRAY) ×1 IMPLANT
PAD DEFIB RADIO PHYSIO CONN (PAD) ×1 IMPLANT
PATCH CARTO3 (PAD) IMPLANT
SHEATH CARTO VIZIGO SM CVD (SHEATH) IMPLANT
SHEATH PINNACLE 8F 10CM (SHEATH) IMPLANT
SHEATH PINNACLE 9F 10CM (SHEATH) IMPLANT
SHEATH PROBE COVER 6X72 (BAG) IMPLANT
TUBING SMART ABLATE COOLFLOW (TUBING) IMPLANT

## 2022-03-18 NOTE — Anesthesia Preprocedure Evaluation (Addendum)
Anesthesia Evaluation  Patient identified by MRN, date of birth, ID band Patient awake    Reviewed: Allergy & Precautions, NPO status , Patient's Chart, lab work & pertinent test results  Airway Mallampati: IV  TM Distance: <3 FB Neck ROM: Full    Dental  (+) Teeth Intact, Dental Advisory Given   Pulmonary asthma , sleep apnea , COPD   breath sounds clear to auscultation       Cardiovascular hypertension, Pt. on medications + CAD  + dysrhythmias Atrial Fibrillation  Rhythm:Regular Rate:Normal  Echo:  1. Left ventricular ejection fraction, by estimation, is 60 to 65%. The  left ventricle has normal function. The left ventricle has no regional  wall motion abnormalities. There is moderate left ventricular hypertrophy.  Left ventricular diastolic  parameters are consistent with Grade II diastolic dysfunction  (pseudonormalization). Elevated left atrial pressure.   2. Right ventricular systolic function is normal. The right ventricular  size is normal. There is mildly elevated pulmonary artery systolic  pressure.     Neuro/Psych  PSYCHIATRIC DISORDERS Anxiety Depression    negative neurological ROS     GI/Hepatic Neg liver ROS,GERD  Medicated,,  Endo/Other  negative endocrine ROS    Renal/GU negative Renal ROS     Musculoskeletal negative musculoskeletal ROS (+)    Abdominal  (+) + obese  Peds  Hematology   Anesthesia Other Findings   Reproductive/Obstetrics                             Anesthesia Physical Anesthesia Plan  ASA: 3  Anesthesia Plan: General   Post-op Pain Management: Minimal or no pain anticipated   Induction: Intravenous  PONV Risk Score and Plan: 2 and Ondansetron and Treatment may vary due to age or medical condition  Airway Management Planned: Oral ETT  Additional Equipment: None  Intra-op Plan:   Post-operative Plan: Extubation in OR  Informed Consent: I  have reviewed the patients History and Physical, chart, labs and discussed the procedure including the risks, benefits and alternatives for the proposed anesthesia with the patient or authorized representative who has indicated his/her understanding and acceptance.     Dental advisory given  Plan Discussed with: CRNA  Anesthesia Plan Comments:        Anesthesia Quick Evaluation

## 2022-03-18 NOTE — Transfer of Care (Signed)
Immediate Anesthesia Transfer of Care Note  Patient: [name] [name]  Procedure(s) Performed: ATRIAL FIBRILLATION ABLATION  Patient Location: PACU  Anesthesia Type:General  Level of Consciousness: drowsy and patient cooperative  Airway & Oxygen Therapy: Patient Spontanous Breathing and Patient connected to nasal cannula oxygen  Post-op Assessment: Report given to RN and Post -op Vital signs reviewed and stable  Post vital signs: Reviewed and stable  Last Vitals:  Vitals Value Taken Time  BP    Temp    Pulse    Resp    SpO2      Last Pain:  Vitals:   03/18/22 1018  TempSrc: Oral  PainSc:          Complications: No notable events documented.

## 2022-03-18 NOTE — Anesthesia Procedure Notes (Signed)
Procedure Name: Intubation Date/Time: 03/18/2022 12:14 PM  Performed by: Lance Coon, CRNAPre-anesthesia Checklist: Patient identified, Emergency Drugs available, Suction available and Patient being monitored Patient Re-evaluated:Patient Re-evaluated prior to induction Oxygen Delivery Method: Circle system utilized Preoxygenation: Pre-oxygenation with 100% oxygen Induction Type: IV induction Ventilation: Mask ventilation without difficulty and Oral airway inserted - appropriate to patient size Laryngoscope Size: Miller and 3 Grade View: Grade II Tube type: Oral Tube size: 7.0 mm Number of attempts: 1 Airway Equipment and Method: Stylet and Oral airway Placement Confirmation: ETT inserted through vocal cords under direct vision, positive ETCO2 and breath sounds checked- equal and bilateral Secured at: 22 cm Tube secured with: Tape Dental Injury: Teeth and Oropharynx as per pre-operative assessment

## 2022-03-18 NOTE — H&P (Signed)
Cardiology Office Note:    Date:  03/18/2022   ID:  [name] [name], DOB 1940/10/12, MRN [id number]  PCP:  Monico Blitz, [place] Providers Cardiologist:  Rozann Lesches, MD Electrophysiologist:  Melida Quitter, MD     Referring MD: No ref. provider found   Chief complaint: fatigue, shortness of breath  History of Present Illness:    [name] [name] is a 81 y.o. female with a hx of paroxysmal AF, atrial flutter, COPD, OSA who presents for AF ablation.  She is a former patient of Dr. Rayann Heman and underwent an AF ablation in June, 2022. There was some reconnection of the right inferior pulmonary vein and the left superior veins. These were re-ablated. Additionally, a posterior wall box lesion set was created.  She has been managed on Tikosyn and xarelto since. She saw Dr. Domenic Polite in follow-up a few days ago and noted that she has not been feeling well recently. Her ILR has shown an increase in AF burden, now 65% (9/23 month 50%, 8/23 was 25%). ECG during that clinic visit showed an atypical flutter with a cycle length of about 300 msec.  She has had a history of progressive fatigue beginning in July or August. No chest pain, syncope, pre-syncope.  I reviewed the patient's CT and labs. There was no LAA thrombus. she  has not missed any doses of anticoagulation, and she took her dose last night. There have been no changes in the patient's diagnoses, medications, or condition since our recent clinic visit.  l     Past Medical History:  Diagnosis Date   Allergic rhinitis    Anxiety    Asthma    Atrial flutter (HCC)    COPD (chronic obstructive pulmonary disease) ([place])    Depression    Essential hypertension    Hyperlipidemia    Lumbar disc disease    Obesity    OSA (obstructive sleep apnea)    CPAP   Paroxysmal atrial fibrillation (HCC)    Element of tachycardia bradycardia syndrome   Respiratory failure ([place])     Past Surgical History:   Procedure Laterality Date   ANTERIOR FUSION LUMBAR SPINE     ATRIAL FIBRILLATION ABLATION N/A 09/12/2020   Procedure: ATRIAL FIBRILLATION ABLATION;  Surgeon: Thompson Grayer, MD;  Location: [facility];  Service: Cardiovascular;  Laterality: N/A;   BIOPSY  08/27/2016   Procedure: BIOPSY;  Surgeon: Rogene Houston, MD;  [facility];  Service: Endoscopy;;  FOUR GASTRIC POLYPS BIOPSIED   CARDIOVERSION N/A 03/28/2014   Procedure: CARDIOVERSION;  Surgeon: Fay Records, MD;  Location: [facility];  Service: Cardiovascular;  Laterality: N/A;   CARDIOVERSION N/A 10/22/2016   Procedure: CARDIOVERSION;  Surgeon: Sueanne Margarita, MD;  Location[facility] ENDOSCOPY;  Service: Cardiovascular;  Laterality: N/A;   CARDIOVERSION N/A 11/26/2020   Procedure: CARDIOVERSION;  Surgeon: Fay Records, MD;  Location: [facility];  Service: Cardiovascular;  Laterality: N/A;   CATARACT EXTRACTION     COLONOSCOPY N/A 08/04/2012   Procedure: COLONOSCOPY;  Surgeon: Rogene Houston, MD;  [facility];  Service: Endoscopy;  Laterality: N/A;  730-rescheduled to [place] notified pt   ELECTROPHYSIOLOGIC STUDY N/A 09/18/2014   Procedure: Atrial Fibrillation Ablation;  Surgeon: Thompson Grayer, MD;  Location: [facility];  Service: Cardiovascular;  Laterality: N/A;   ESOPHAGOGASTRODUODENOSCOPY N/A 08/27/2016   Procedure: ESOPHAGOGASTRODUODENOSCOPY (EGD);  Surgeon: Rogene Houston, MD;  [facility];  Service: Endoscopy;  Laterality: N/A;  1200   implantable loop recorder placement  11/22/2020   Medtronic Reveal Linq model LNQ 11 (SN [id number]) implantable loop recorder   LASIK     Both eyes   TEE WITHOUT CARDIOVERSION N/A 09/18/2014   Procedure: TRANSESOPHAGEAL ECHOCARDIOGRAM (TEE);  Surgeon: Larey Dresser, MD;  Location: [facility];  Service: Cardiovascular;  Laterality: N/A;   TEE WITHOUT CARDIOVERSION N/A 10/22/2016   Procedure: TRANSESOPHAGEAL ECHOCARDIOGRAM (TEE);  Surgeon:  Sueanne Margarita, MD;  Location: [facility] ENDOSCOPY;  Service: Cardiovascular;  Laterality: N/A;   TEE WITHOUT CARDIOVERSION N/A 08/24/2017   Procedure: TRANSESOPHAGEAL ECHOCARDIOGRAM (TEE);  Surgeon: Josue Hector, MD;  Location: [facility] ENDOSCOPY;  Service: Cardiovascular;  Laterality: N/A;   TOTAL HIP ARTHROPLASTY  03/30/2008   Right    Current Medications: Current Meds  Medication Sig   antiseptic oral rinse (BIOTENE) LIQD 15 mLs by Mouth Rinse route daily as needed for dry mouth.   atorvastatin (LIPITOR) 10 MG tablet Take 10 mg by mouth daily at 6 PM.   azelastine (ASTELIN) 0.1 % nasal spray Place 2 sprays into both nostrils 2 (two) times daily. Use in each nostril as directed (Patient taking differently: Place 2 sprays into both nostrils daily. Use in each nostril as directed)   Bacillus Coagulans-Inulin (PROBIOTIC-PREBIOTIC PO) Take 1 capsule by mouth daily.   Biotin 1 MG CAPS Take 1,000 mg by mouth 3 (three) times a week.    budesonide (PULMICORT) 0.5 MG/2ML nebulizer solution Take 0.5 mg by nebulization daily.   Calcium Carbonate-Vit D-Min (QC CALCIUM-MAGNESIUM-ZINC-D3) 333.4-133 MG-UNIT TABS Take 1 tablet by mouth 2 (two) times daily.    diltiazem (CARDIZEM CD) 120 MG 24 hr capsule TAKE ONE CAPSULE BY MOUTH in THE morning AND ONE capsule in THE evening (Patient taking differently: Take 120 mg by mouth at bedtime.)   diltiazem (CARDIZEM CD) 240 MG 24 hr capsule TAKE ONE CAPSULE BY MOUTH EVERY MORNING   dofetilide (TIKOSYN) 500 MCG capsule Take 1 capsule (500 mcg total) by mouth 2 (two) times daily.   famotidine (PEPCID) 20 MG tablet Take 2 tablets (40 mg total) by mouth at bedtime as needed.   fluticasone (FLONASE) 50 MCG/ACT nasal spray Place 1 spray into both nostrils 2 (two) times daily.   hydrALAZINE (APRESOLINE) 100 MG tablet Take 100 mg by mouth 2 (two) times daily.   HYDROcodone bit-homatropine (HYCODAN) 5-1.5 MG/5ML syrup Take 5 mLs by mouth every 6 (six) hours as needed for cough.    Hypertonic Nasal Wash (SINUS RINSE) PACK Place 1 each into the nose daily as needed (clear nasal passage). Neilmed sinus rinse   ipratropium-albuterol (DUONEB) 0.5-2.5 (3) MG/3ML SOLN Take 3 mLs by nebulization daily.   losartan (COZAAR) 100 MG tablet TAKE 1 TABLET BY MOUTH DAILY   Magnesium 250 MG TABS Take 250 mg by mouth daily.   montelukast (SINGULAIR) 10 MG tablet Take 10 mg by mouth at bedtime.   omeprazole (PRILOSEC) 40 MG capsule Take 1 capsule (40 mg total) by mouth 2 (two) times daily.   Polyethyl Glyc-Propyl Glyc PF (SYSTANE ULTRA PF) 0.4-0.3 % SOLN Place 1 drop into both eyes as needed (dry eyes).   Polyethyl Glycol-Propyl Glycol (SYSTANE) 0.4-0.3 % GEL ophthalmic gel Place 1 Application into both eyes as needed (dry eyes).   potassium chloride SA (KLOR-CON M) 20 MEQ tablet TAKE 1 TABLET BY MOUTH DAILY   rivaroxaban (XARELTO) 20 MG TABS tablet Take 1 tablet (20 mg total) by mouth daily  with supper.   umeclidinium-vilanterol (ANORO ELLIPTA) 62.5-25 MCG/ACT AEPB INHALE ONE PUFF INTO THE LUNGS DAILY (Patient taking differently: 1 puff every morning.)   Wheat Dextrin (BENEFIBER ON THE GO) POWD Take 10 mLs by mouth daily.   White Petrolatum-Mineral Oil (SYSTANE NIGHTTIME) OINT Place 1 drop into both eyes at bedtime.   [DISCONTINUED] XARELTO 20 MG TABS tablet TAKE 1 TABLET BY MOUTH DAILY with SUPPER     Allergies:   Bupropion hcl, Escitalopram oxalate, Fluticasone-salmeterol, Lisinopril, and Serevent   Social History   Socioeconomic History   Marital status: Single    Spouse name: Not on file   Number of children: Not on file   Years of education: Not on file   Highest education level: Not on file  Occupational History   Occupation: Retired: Presenter, broadcasting  Tobacco Use   Smoking status: Never    Passive exposure: Past   Smokeless tobacco: Never  Vaping Use   Vaping Use: Never used  Substance and Sexual Activity   Alcohol use: No    Alcohol/week: 0.0 standard drinks of  alcohol   Drug use: No   Sexual activity: Not on file  Other Topics Concern   Not on file  Social History Narrative   Pt lives in [place] alone. She was never married.   Retired Customer service manager.   Attends [employer]   Social Determinants of Health   Financial Resource Strain: Not on [employer] Insecurity: Not on file  Transportation Needs: Not on file  Physical Activity: Not on file  Stress: Not on file  Social Connections: Not on file     Family History: The patient's family history includes Cancer in her mother; Colon cancer in an other family member; Heart attack in her father.  ROS:   Please see the history of present illness.    All other systems reviewed and are negative.  EKGs/Labs/Other Studies Reviewed:     EKG:  Last EKG results: today - sinus rhythm with PACs. Very low atrial amplitude.   Recent Labs: 09/08/2021: Magnesium 2.3 01/12/2022: Pro B Natriuretic peptide (BNP) 114.0 03/09/2022: BUN 20; Creatinine, Ser 1.14; Hemoglobin 10.9; Platelets 247; Potassium 3.9; Sodium 139    Risk Assessment/Calculations:    CHA2DS2-VASc Score = 5   This indicates a 7.2% annual risk of stroke. The patient's score is based upon: CHF History: 0 HTN History: 1 Diabetes History: 0 Stroke History: 0 Vascular Disease History: 1 Age Score: 2 Gender Score: 1           Physical Exam:    VS:  Temp 98.3 F (36.8 C) (Oral)   Ht 5' 1.5" (1.562 m)   Wt 87.3 kg   BMI 35.78 kg/m     Wt Readings from Last 3 Encounters:  03/18/22 87.3 kg  03/11/22 90.3 kg  03/02/22 88.7 kg     GEN: Well nourished, well developed in no acute distress CARDIAC: irregular, no murmurs, rubs, gallops RESPIRATORY:  Normal work of breathing MUSCULOSKELETAL: trace edema    ASSESSMENT & PLAN:    Persistent atrial fibrillation: ECG today is irregular but there are visible P waves. Many of the ILR recordings appear to be sinus rhythm with frequent PACs though others are AF,  and I suspect her burden is increasing. Patient reports significant increase in symptoms. We discussed management options including escalating to amiodarone, repeat mapping and ablation. I discussed the relative risks and benefits -- both of which include risks of serious complications including  death. We will proceed with ablation as discussed previously. Atypical atrial flutter: documented on 10/24 ECG. Likely involving the posterior LA.  Secondary hypercoagulable state: continue anticoagulation for CHADS2Vasc of 3       Medication Adjustments/Labs and Tests Ordered: Current medicines are reviewed at length with the patient today.  Concerns regarding medicines are outlined above.  Orders Placed This Encounter  Procedures   Informed Consent Details: Physician/Practitioner Attestation; Transcribe to consent form and obtain patient signature   Initiate Pre-op Protocol   Void on call to EP Lab   Confirm CBC and BMP (or CMP) results within 7 days for inpatient and 30 days for outpatient:   Clip right and left femoral area PM before surgery   Clip right internal jugular area PM before surgery   Pre-admission testing diagnosis   EP STUDY   Insert peripheral IV   Meds ordered this encounter  Medications   0.9 %  sodium chloride infusion     Signed, Melida Quitter, MD  03/18/2022 12:04 PM    [place]

## 2022-03-18 NOTE — Anesthesia Postprocedure Evaluation (Signed)
Anesthesia Post Note  Patient: [name] [name]  Procedure(s) Performed: ATRIAL FIBRILLATION ABLATION     Patient location during evaluation: PACU Anesthesia Type: General Level of consciousness: awake and alert Pain management: pain level controlled Vital Signs Assessment: post-procedure vital signs reviewed and stable Respiratory status: spontaneous breathing, nonlabored ventilation and respiratory function stable Cardiovascular status: stable and blood pressure returned to baseline Anesthetic complications: no   No notable events documented.  Last Vitals:  Vitals:   03/18/22 1500 03/18/22 1515  BP: (!) 131/53 (!) 135/59  Pulse:  71  Resp: 18 11  Temp:    SpO2:  96%    Last Pain:  Vitals:   03/18/22 1018  TempSrc: Oral  PainSc:                  Audry Pili

## 2022-03-18 NOTE — Discharge Instructions (Signed)
Post procedure care instructions No driving for 4 days. No lifting over 5 lbs for 1 week. No vigorous or sexual activity for 1 week. You may return to work/your usual activities on 03/26/22. Keep procedure site clean & dry. If you notice increased pain, swelling, bleeding or pus, call/return!  You may shower after 24 hours, but no soaking in baths/hot tubs/pools for 1 week.    You have an appointment set up with the [facility].  Multiple studies have shown that being followed by a dedicated atrial fibrillation clinic in addition to the standard care you receive from your other physicians improves health. We believe that enrollment in the atrial fibrillation clinic will allow [place] to better care for you.   The phone number to the [facility] is [phone]. The clinic is staffed Monday through Friday from 8:30am to 5pm.  Directions: The clinic is located in the [facility], [place] the hospital at the MAIN ENTRANCE "A", use [address] to the 6th floor.  Registration desk to the right of elevators on 6th floor  If you have any trouble locating the clinic, please don't hesitate to call [phone].

## 2022-03-19 ENCOUNTER — Encounter (HOSPITAL_COMMUNITY): Payer: Self-pay | Admitting: Cardiovascular Disease

## 2022-03-24 ENCOUNTER — Ambulatory Visit (INDEPENDENT_AMBULATORY_CARE_PROVIDER_SITE_OTHER): Payer: Medicare Other

## 2022-03-24 DIAGNOSIS — I495 Sick sinus syndrome: Secondary | ICD-10-CM | POA: Diagnosis not present

## 2022-03-24 LAB — CUP PACEART REMOTE DEVICE CHECK
Date Time Interrogation Session: 20231225231026
Implantable Pulse Generator Implant Date: 20220826

## 2022-03-26 NOTE — Telephone Encounter (Signed)
Seen 03/02/22 and omeprazole was increased from '40mg'$  daily to one bid due to coughing. Reports cough is better since increasing med but having more acid in throat and now nausea. Has been eating more citrus food as well that she thinks may be contributing. Does take pepcid at night as needed and it does seem to help.   [facility]

## 2022-03-27 DIAGNOSIS — I10 Essential (primary) hypertension: Secondary | ICD-10-CM | POA: Diagnosis not present

## 2022-03-31 ENCOUNTER — Encounter: Payer: Self-pay | Admitting: Internal Medicine

## 2022-03-31 DIAGNOSIS — Z6836 Body mass index (BMI) 36.0-36.9, adult: Secondary | ICD-10-CM | POA: Diagnosis not present

## 2022-03-31 DIAGNOSIS — I10 Essential (primary) hypertension: Secondary | ICD-10-CM | POA: Diagnosis not present

## 2022-03-31 DIAGNOSIS — I4891 Unspecified atrial fibrillation: Secondary | ICD-10-CM | POA: Diagnosis not present

## 2022-03-31 DIAGNOSIS — Z299 Encounter for prophylactic measures, unspecified: Secondary | ICD-10-CM | POA: Diagnosis not present

## 2022-03-31 DIAGNOSIS — J441 Chronic obstructive pulmonary disease with (acute) exacerbation: Secondary | ICD-10-CM | POA: Diagnosis not present

## 2022-04-01 ENCOUNTER — Ambulatory Visit: Payer: Medicare Other | Admitting: Podiatry

## 2022-04-01 NOTE — Telephone Encounter (Signed)
I spoke with the patient and made her aware of all.

## 2022-04-04 ENCOUNTER — Encounter: Payer: Self-pay | Admitting: Internal Medicine

## 2022-04-06 DIAGNOSIS — N950 Postmenopausal bleeding: Secondary | ICD-10-CM | POA: Diagnosis not present

## 2022-04-07 ENCOUNTER — Ambulatory Visit (INDEPENDENT_AMBULATORY_CARE_PROVIDER_SITE_OTHER): Payer: Medicare Other | Admitting: Podiatry

## 2022-04-07 ENCOUNTER — Encounter: Payer: Self-pay | Admitting: Internal Medicine

## 2022-04-07 ENCOUNTER — Ambulatory Visit (INDEPENDENT_AMBULATORY_CARE_PROVIDER_SITE_OTHER): Payer: Medicare Other | Admitting: *Deleted

## 2022-04-07 VITALS — BP 160/63 | HR 61 | Temp 97.8°F | Resp 16 | Ht 61.0 in | Wt 197.6 lb

## 2022-04-07 DIAGNOSIS — J4520 Mild intermittent asthma, uncomplicated: Secondary | ICD-10-CM | POA: Diagnosis not present

## 2022-04-07 DIAGNOSIS — L97511 Non-pressure chronic ulcer of other part of right foot limited to breakdown of skin: Secondary | ICD-10-CM | POA: Diagnosis not present

## 2022-04-07 MED ORDER — MEPOLIZUMAB 100 MG ~~LOC~~ SOLR
100.0000 mg | Freq: Once | SUBCUTANEOUS | Status: AC
  Administered 2022-04-07: 100 mg via SUBCUTANEOUS
  Filled 2022-04-07: qty 1

## 2022-04-07 NOTE — Progress Notes (Signed)
Subjective: Chief Complaint  Patient presents with   Foot Problem    Follow up on blister, right foot, 2nd digit   82 year old female presents the office with the above concerns.  She said that is doing better.  No open lesions that she reports no drainage or pus.  No swelling or redness.  No fevers or chills.  Objective: AAO x3, NAD DP/PT pulses palpable bilaterally, CRT less than 3 seconds On the right second toe wound.  The previous blister is 1 small preulcerative lesion but there is no definitive skin breakdown today.  There is no drainage or pus.  There is no fluctuance or crepitation but there is no malodor.  Digital form is noted. No pain with calf compression, swelling, warmth, erythema  Assessment: Healing wound right 2nd toe  Plan: -All treatment options discussed with the patient including all alternatives, risks, complications.  -Appears that she is doing much better.  I would continue with small-moderate antibiotic ointment and a bandage and continue daily foot inspection, offloading.  A portion of running behind schedule and she had relief with exam with limited toe.  Orthotics were dispensed today.  Written and oral instructions provided.  Discussed that she needs any adjustments to the insulin nail. -Patient encouraged to call the office with any questions, concerns, change in symptoms.   [name]

## 2022-04-07 NOTE — Progress Notes (Signed)
Diagnosis: Asthma  Provider:  Praveen Mannam MD  Procedure: Injection  Nucala (Mepolizumab), Dose: 100 mg, Site: subcutaneous, Number of injections: 1  Post Care: Observation period completed  Discharge: Condition: Good, Destination: Home . AVS provided to patient.   Performed by:  Gaynelle Pastrana A, RN       

## 2022-04-08 ENCOUNTER — Ambulatory Visit: Payer: Medicare Other

## 2022-04-09 ENCOUNTER — Ambulatory Visit: Payer: Medicare Other

## 2022-04-09 DIAGNOSIS — M2041 Other hammer toe(s) (acquired), right foot: Secondary | ICD-10-CM

## 2022-04-09 DIAGNOSIS — M722 Plantar fascial fibromatosis: Secondary | ICD-10-CM

## 2022-04-09 NOTE — Progress Notes (Signed)
Patient presents to the office today with issues concerning their custom inserts. Patient states that the unloads are not in the second requested pair.  Will send orthotics to lab for adjustments.  Advised patient that she will receive a call from the office to come in for a fitting.

## 2022-04-13 DIAGNOSIS — M9903 Segmental and somatic dysfunction of lumbar region: Secondary | ICD-10-CM | POA: Diagnosis not present

## 2022-04-13 DIAGNOSIS — M9901 Segmental and somatic dysfunction of cervical region: Secondary | ICD-10-CM | POA: Diagnosis not present

## 2022-04-13 DIAGNOSIS — M546 Pain in thoracic spine: Secondary | ICD-10-CM | POA: Diagnosis not present

## 2022-04-13 DIAGNOSIS — M25571 Pain in right ankle and joints of right foot: Secondary | ICD-10-CM | POA: Diagnosis not present

## 2022-04-13 DIAGNOSIS — M62830 Muscle spasm of back: Secondary | ICD-10-CM | POA: Diagnosis not present

## 2022-04-13 DIAGNOSIS — M25572 Pain in left ankle and joints of left foot: Secondary | ICD-10-CM | POA: Diagnosis not present

## 2022-04-13 DIAGNOSIS — M9902 Segmental and somatic dysfunction of thoracic region: Secondary | ICD-10-CM | POA: Diagnosis not present

## 2022-04-15 ENCOUNTER — Ambulatory Visit: Payer: Medicare Other | Admitting: Podiatry

## 2022-04-15 ENCOUNTER — Ambulatory Visit (HOSPITAL_COMMUNITY)
Admission: RE | Admit: 2022-04-15 | Discharge: 2022-04-15 | Disposition: A | Discharge status: Home / Self Care | Payer: Medicare Other | Source: Ambulatory Visit | Attending: Nurse Practitioner | Admitting: Nurse Practitioner

## 2022-04-15 VITALS — BP 154/60 | HR 62 | Ht 61.0 in | Wt 200.0 lb

## 2022-04-15 DIAGNOSIS — I480 Paroxysmal atrial fibrillation: Secondary | ICD-10-CM | POA: Diagnosis not present

## 2022-04-15 DIAGNOSIS — I499 Cardiac arrhythmia, unspecified: Secondary | ICD-10-CM | POA: Insufficient documentation

## 2022-04-15 DIAGNOSIS — R9431 Abnormal electrocardiogram [ECG] [EKG]: Secondary | ICD-10-CM | POA: Insufficient documentation

## 2022-04-15 DIAGNOSIS — D6869 Other thrombophilia: Secondary | ICD-10-CM

## 2022-04-15 DIAGNOSIS — Z79899 Other long term (current) drug therapy: Secondary | ICD-10-CM | POA: Insufficient documentation

## 2022-04-15 LAB — BASIC METABOLIC PANEL
Anion gap: 7 (ref 5–15)
BUN: 18 mg/dL (ref 8–23)
CO2: 23 mmol/L (ref 22–32)
Calcium: 8.8 mg/dL — ABNORMAL LOW (ref 8.9–10.3)
Chloride: 106 mmol/L (ref 98–111)
Creatinine, Ser: 1.19 mg/dL — ABNORMAL HIGH (ref 0.44–1.00)
GFR, Estimated: 46 mL/min — ABNORMAL LOW (ref >60)
Glucose, Bld: 106 mg/dL — ABNORMAL HIGH (ref 70–99)
Potassium: 4 mmol/L (ref 3.5–5.1)
Sodium: 136 mmol/L (ref 135–145)

## 2022-04-15 LAB — MAGNESIUM: Magnesium: 2.2 mg/dL (ref 1.7–2.4)

## 2022-04-15 MED ORDER — DILTIAZEM HCL ER COATED BEADS 120 MG PO CP24: 120.0000 mg | ORAL_CAPSULE | Freq: Every evening | ORAL | Status: DC

## 2022-04-15 NOTE — Progress Notes (Signed)
Primary Care Physician: Monico Blitz, MD Referring Physician: Dr. [name] is a 82 y.o. female with a h/o PPM,  afib, s/p afib ablation 2022 and again on 03/19/23,  Tikosyn for further arrhythmia management. She is here for f/u ablation today.  She saw Dr. [name] in early June at which time she was in [place] and her afib burden was 2% but he felt  her device may have been underdetecting  and changed her AF setting to AT/AF and sensitivity from least to less.  Today , her ekg shows SR today  and she states that she has felt overall felt well. Occasionally she will still have episodes  of fluttering and not feel well, but she feels improved from a heart rhythm standpoint  from a year ago. She is now going to pulmonary rehab and physical therapy several times a week and seeing improvement   F/u in afib, clinic,  04/15/22,  as pt had noticed increased afib burden and fatigue last summer. She was referred to Dr. [name] and he took her back for a repeat ablation 03/19/23. Today, EKG shows sinus rhythm with very flat p waves. She continues on dofetilide 500 mcg bid.  Qtc stable. She is still c/o fatigue and sleeping a lot. She has started back on maintenance pulmonary rehab. Review of ILR still  shows some daily episodes of afib .  No swallowing or groin issus but she does have reflux problems and is due for an upper endoscopy but will have to wait to interrupt anticoagulation until after  4/5. (end of 3 month healing period post ablation )   Today, she denies symptoms of palpitations, chest pain, shortness of breath, orthopnea, PND, lower extremity edema, dizziness, presyncope, syncope, or neurologic sequela. The patient is tolerating medications without difficulties and is otherwise without complaint today.   Past Medical History:  Diagnosis Date   Allergic rhinitis    Anxiety    Asthma    Atrial flutter (HCC)    COPD (chronic obstructive pulmonary disease) ([place])    Depression     Essential hypertension    Hyperlipidemia    Lumbar disc disease    Obesity    OSA (obstructive sleep apnea)    CPAP   Paroxysmal atrial fibrillation (HCC)    Element of tachycardia bradycardia syndrome   Respiratory failure ([place])    Past Surgical History:  Procedure Laterality Date   ANTERIOR FUSION LUMBAR SPINE     ATRIAL FIBRILLATION ABLATION N/A 09/12/2020   Procedure: ATRIAL FIBRILLATION ABLATION;  Surgeon: [name] Grayer, MD;  Location: [place] [facility];  Service: Cardiovascular;  Laterality: N/A;   ATRIAL FIBRILLATION ABLATION N/A 03/18/2022   Procedure: ATRIAL FIBRILLATION ABLATION;  Surgeon: [name] Quitter, MD;  Location: [place] [facility];  Service: Cardiovascular;  Laterality: N/A;   BIOPSY  08/27/2016   Procedure: BIOPSY;  Surgeon: Rogene Houston, MD;  [facility];  Service: Endoscopy;;  FOUR GASTRIC POLYPS BIOPSIED   CARDIOVERSION N/A 03/28/2014   Procedure: CARDIOVERSION;  Surgeon: Fay Records, MD;  Location: [facility];  Service: Cardiovascular;  Laterality: N/A;   CARDIOVERSION N/A 10/22/2016   Procedure: CARDIOVERSION;  Surgeon: Sueanne Margarita, MD;  Location: [facility] ENDOSCOPY;  Service: Cardiovascular;  Laterality: N/A;   CARDIOVERSION N/A 11/26/2020   Procedure: CARDIOVERSION;  Surgeon: Fay Records, MD;  Location: [place];  Service: Cardiovascular;  Laterality: N/A;   CATARACT EXTRACTION     COLONOSCOPY N/A  08/04/2012   Procedure: COLONOSCOPY;  Surgeon: Rogene Houston, MD;  [facility];  Service: Endoscopy;  Laterality: N/A;  730-rescheduled to [place] notified pt   ELECTROPHYSIOLOGIC STUDY N/A 09/18/2014   Procedure: Atrial Fibrillation Ablation;  Surgeon: [name] Grayer, MD;  Location: [place] [facility];  Service: Cardiovascular;  Laterality: N/A;   ESOPHAGOGASTRODUODENOSCOPY N/A 08/27/2016   Procedure: ESOPHAGOGASTRODUODENOSCOPY (EGD);  Surgeon: Rogene Houston, MD;  [facility];  Service: Endoscopy;  Laterality: N/A;   1200   implantable loop recorder placement  11/22/2020   Medtronic Reveal Linq model LNQ 11 (SN [id number]) implantable loop recorder   LASIK     Both eyes   TEE WITHOUT CARDIOVERSION N/A 09/18/2014   Procedure: TRANSESOPHAGEAL ECHOCARDIOGRAM (TEE);  Surgeon: Larey Dresser, MD;  Location: [place];  Service: Cardiovascular;  Laterality: N/A;   TEE WITHOUT CARDIOVERSION N/A 10/22/2016   Procedure: TRANSESOPHAGEAL ECHOCARDIOGRAM (TEE);  Surgeon: Sueanne Margarita, MD;  Location: [facility] ENDOSCOPY;  Service: Cardiovascular;  Laterality: N/A;   TEE WITHOUT CARDIOVERSION N/A 08/24/2017   Procedure: TRANSESOPHAGEAL ECHOCARDIOGRAM (TEE);  Surgeon: Josue Hector, MD;  Location: [facility] ENDOSCOPY;  Service: Cardiovascular;  Laterality: N/A;   TOTAL HIP ARTHROPLASTY  03/30/2008   Right    Current Outpatient Medications  Medication Sig Dispense Refill   antiseptic oral rinse (BIOTENE) LIQD 15 mLs by Mouth Rinse route daily as needed for dry mouth.     atorvastatin (LIPITOR) 10 MG tablet Take 10 mg by mouth daily at 6 PM.     azelastine (ASTELIN) 0.1 % nasal spray Place 2 sprays into both nostrils 2 (two) times daily. Use in each nostril as directed (Patient taking differently: Place 2 sprays into both nostrils daily. Use in each nostril as directed) 30 mL 11   Bacillus Coagulans-Inulin (PROBIOTIC-PREBIOTIC PO) Take 1 capsule by mouth daily.     benzonatate (TESSALON) 200 MG capsule Take 1 capsule (200 mg total) by mouth 3 (three) times daily as needed for cough. 30 capsule 3   Biotin 1 MG CAPS Take 1,000 mg by mouth 3 (three) times a week.      budesonide (PULMICORT) 0.5 MG/2ML nebulizer solution Take 0.5 mg by nebulization daily.     Calcium Carbonate-Vit D-Min (QC CALCIUM-MAGNESIUM-ZINC-D3) 333.4-133 MG-UNIT TABS Take 1 tablet by mouth 2 (two) times daily.      cetirizine (ZYRTEC) 10 MG tablet Take 10 mg by mouth at bedtime as needed for allergies.     clindamycin (CLEOCIN) 300 MG capsule Take 600 mg  by mouth See admin instructions. Take prior to dental procedures  0   colchicine 0.6 MG tablet Take 0.5 tablets (0.3 mg total) by mouth 2 (two) times daily for 5 days. 5 tablet 0   diltiazem (CARDIZEM CD) 120 MG 24 hr capsule TAKE ONE CAPSULE BY MOUTH in THE morning AND ONE capsule in THE evening (Patient taking differently: Take 120 mg by mouth at bedtime.) 180 capsule 1   diltiazem (CARDIZEM CD) 240 MG 24 hr capsule TAKE ONE CAPSULE BY MOUTH EVERY MORNING 30 capsule 5   diltiazem (CARDIZEM) 30 MG tablet TAKE 1 TABLET BY MOUTH AS NEEDED FOR PALPITATIONS 45 tablet 2   dofetilide (TIKOSYN) 500 MCG capsule Take 1 capsule (500 mcg total) by mouth 2 (two) times daily. 180 capsule 1   EPINEPHrine 0.3 mg/0.3 mL IJ SOAJ injection Inject 0.3 mg into the muscle as needed for anaphylaxis.     famotidine (PEPCID) 20 MG tablet Take  2 tablets (40 mg total) by mouth at bedtime as needed. 30 tablet 1   fluticasone (FLONASE) 50 MCG/ACT nasal spray Place 1 spray into both nostrils 2 (two) times daily.     hydrALAZINE (APRESOLINE) 100 MG tablet Take 100 mg by mouth 2 (two) times daily.     HYDROcodone bit-homatropine (HYCODAN) 5-1.5 MG/5ML syrup Take 5 mLs by mouth every 6 (six) hours as needed for cough.     Hypertonic Nasal Wash (SINUS RINSE) PACK Place 1 each into the nose daily as needed (clear nasal passage). Neilmed sinus rinse     ipratropium-albuterol (DUONEB) 0.5-2.5 (3) MG/3ML SOLN Take 3 mLs by nebulization daily.     losartan (COZAAR) 100 MG tablet TAKE 1 TABLET BY MOUTH DAILY 90 tablet 3   Magnesium 250 MG TABS Take 250 mg by mouth daily.     montelukast (SINGULAIR) 10 MG tablet Take 10 mg by mouth at bedtime.     omeprazole (PRILOSEC) 40 MG capsule Take 1 capsule (40 mg total) by mouth 2 (two) times daily. 180 capsule 1   Polyethyl Glyc-Propyl Glyc PF (SYSTANE ULTRA PF) 0.4-0.3 % SOLN Place 1 drop into both eyes as needed (dry eyes).     Polyethyl Glycol-Propyl Glycol (SYSTANE) 0.4-0.3 % GEL ophthalmic  gel Place 1 Application into both eyes as needed (dry eyes).     potassium chloride SA (KLOR-CON M) 20 MEQ tablet TAKE 1 TABLET BY MOUTH DAILY 90 tablet 3   rivaroxaban (XARELTO) 20 MG TABS tablet Take 1 tablet (20 mg total) by mouth daily with supper. 90 tablet 3   umeclidinium-vilanterol (ANORO ELLIPTA) 62.5-25 MCG/ACT AEPB INHALE ONE PUFF INTO THE LUNGS DAILY (Patient taking differently: 1 puff every morning.) 60 each 11   Wheat Dextrin (BENEFIBER ON THE GO) POWD Take 10 mLs by mouth daily.     White Petrolatum-Mineral Oil (SYSTANE NIGHTTIME) OINT Place 1 drop into both eyes at bedtime.     No current facility-administered medications for this encounter.    Allergies  Allergen Reactions   Bupropion Hcl Other (See Comments)    Suicidal Thoughts.    Escitalopram Oxalate Other (See Comments)    Suicidal Thoughts.    Fluticasone-Salmeterol Other (See Comments)    Advair - Caused patient to go into Afib.    Lisinopril Cough   Serevent Other (See Comments)    Caused patient to go into Afib    Social History   Socioeconomic History   Marital status: Single    Spouse name: Not on file   Number of children: Not on file   Years of education: Not on file   Highest education level: Not on file  Occupational History   Occupation: Retired: Presenter, broadcasting  Tobacco Use   Smoking status: Never    Passive exposure: Past   Smokeless tobacco: Never  Vaping Use   Vaping Use: Never used  Substance and Sexual Activity   Alcohol use: No    Alcohol/week: 0.0 standard drinks of alcohol   Drug use: No   Sexual activity: Not on file  Other Topics Concern   Not on file  Social History Narrative   Pt lives in [place] alone. She was never married.   Retired Customer service manager.   Attends [employer]   Social Determinants of Health   Financial Resource Strain: Not on [employer] Insecurity: Not on file  Transportation Needs: Not on file  Physical Activity: Not on file  Stress:  Not on file  Social Connections: Not on file  Intimate Partner Violence: Not on file    Family History  Problem Relation Age of Onset   Cancer Mother    Heart attack Father    Colon cancer Other     ROS- All systems are reviewed and negative except as per the HPI above  Physical Exam: Vitals:   04/15/22 1319  Height: '5\' 1"'$  (1.549 m)   Wt Readings from Last 3 Encounters:  04/07/22 89.6 kg  03/18/22 87.3 kg  03/11/22 90.3 kg    Labs: Lab Results  Component Value Date   NA 139 03/09/2022   K 3.9 03/09/2022   CL 106 03/09/2022   CO2 25 03/09/2022   GLUCOSE 107 (H) 03/09/2022   BUN 20 03/09/2022   CREATININE 1.14 (H) 03/09/2022   CALCIUM 9.3 03/09/2022   MG 2.3 09/08/2021   Lab Results  Component Value Date   INR 2.0 (H) 09/19/2019   No results found for: "CHOL", "HDL", "LDLCALC", "TRIG"   GEN- The patient is well appearing, alert and oriented x 3 today.   Head- normocephalic, atraumatic Eyes-  Sclera clear, conjunctiva pink Ears- hearing intact Oropharynx- clear Neck- supple, no JVP Lymph- no cervical lymphadenopathy Lungs- Clear to ausculation bilaterally, normal work of breathing Heart- Regular rate and rhythm, no murmurs, rubs or gallops, PMI not laterally displaced GI- soft, NT, ND, + BS Extremities- no clubbing, cyanosis, or edema MS- no significant deformity or atrophy Skin- no rash or lesion Psych- euthymic mood, full affect Neuro- strength and sensation are intact  EKG- Vent. rate 62 BPM PR interval * ms QRS duration 112 ms QT/QTcB 450/456 ms P-R-T axes * -50 67 Undetermined rhythm( I think SR with very flat p waves) Left axis deviation Minimal voltage criteria for LVH, may be normal variant ( Cornell product ) Anterior infarct , age undetermined Abnormal ECG When compared with ECG of 18-Mar-2022 15:40, PREVIOUS ECG IS PRESENT   Assessment and Plan:  1. Afib  She is in SR today S/p 3rd ablation 03/18/22 Review of ILR shows some afib   burden   daily since ablation May be contributing to fatigue and sleepiness  Continue current management with Tikosyn 500 mcg bid and cardizem 120 mg pm and 240 mg pm  Qt stable  Bmet/mag today   2. CHA2DS2VASc  score of at least 4 Continue xarelto 20 mg daily   3. HTN  Stable at home, elevated in office   F/u with Dr. [name] as scheduled 07/03/22  Geroge Baseman. Jiles Goya, [facility] [address] [place], [place] [address] [phone]

## 2022-04-15 NOTE — Progress Notes (Signed)
[facility] Summary Report / Loop Recorder

## 2022-04-20 DIAGNOSIS — M546 Pain in thoracic spine: Secondary | ICD-10-CM | POA: Diagnosis not present

## 2022-04-20 DIAGNOSIS — M9901 Segmental and somatic dysfunction of cervical region: Secondary | ICD-10-CM | POA: Diagnosis not present

## 2022-04-20 DIAGNOSIS — M25572 Pain in left ankle and joints of left foot: Secondary | ICD-10-CM | POA: Diagnosis not present

## 2022-04-20 DIAGNOSIS — M25571 Pain in right ankle and joints of right foot: Secondary | ICD-10-CM | POA: Diagnosis not present

## 2022-04-20 DIAGNOSIS — M9903 Segmental and somatic dysfunction of lumbar region: Secondary | ICD-10-CM | POA: Diagnosis not present

## 2022-04-20 DIAGNOSIS — M9902 Segmental and somatic dysfunction of thoracic region: Secondary | ICD-10-CM | POA: Diagnosis not present

## 2022-04-20 DIAGNOSIS — M62830 Muscle spasm of back: Secondary | ICD-10-CM | POA: Diagnosis not present

## 2022-04-27 ENCOUNTER — Ambulatory Visit: Payer: Medicare Other | Attending: Cardiovascular Disease

## 2022-04-27 DIAGNOSIS — I480 Paroxysmal atrial fibrillation: Secondary | ICD-10-CM | POA: Diagnosis not present

## 2022-04-27 DIAGNOSIS — I10 Essential (primary) hypertension: Secondary | ICD-10-CM | POA: Diagnosis not present

## 2022-04-28 ENCOUNTER — Ambulatory Visit: Payer: Medicare Other

## 2022-04-28 DIAGNOSIS — M722 Plantar fascial fibromatosis: Secondary | ICD-10-CM

## 2022-04-28 LAB — CUP PACEART REMOTE DEVICE CHECK
Date Time Interrogation Session: 20240127231633
Implantable Pulse Generator Implant Date: 20220826

## 2022-04-28 NOTE — Progress Notes (Signed)
Patient presents today to pick up custom molded foot orthotics recommended by Dr. Wagoner.   Orthotics were dispensed and fit was satisfactory. Reviewed instructions for break-in and wear. Written instructions given to patient.  Patient will follow up as needed.   

## 2022-04-29 DIAGNOSIS — I10 Essential (primary) hypertension: Secondary | ICD-10-CM | POA: Diagnosis not present

## 2022-04-29 DIAGNOSIS — Z6836 Body mass index (BMI) 36.0-36.9, adult: Secondary | ICD-10-CM | POA: Diagnosis not present

## 2022-04-29 DIAGNOSIS — E1165 Type 2 diabetes mellitus with hyperglycemia: Secondary | ICD-10-CM | POA: Diagnosis not present

## 2022-04-29 DIAGNOSIS — Z299 Encounter for prophylactic measures, unspecified: Secondary | ICD-10-CM | POA: Diagnosis not present

## 2022-04-29 DIAGNOSIS — J449 Chronic obstructive pulmonary disease, unspecified: Secondary | ICD-10-CM | POA: Diagnosis not present

## 2022-04-30 ENCOUNTER — Other Ambulatory Visit (HOSPITAL_COMMUNITY): Payer: Self-pay | Admitting: Cardiology

## 2022-05-06 ENCOUNTER — Ambulatory Visit (INDEPENDENT_AMBULATORY_CARE_PROVIDER_SITE_OTHER): Payer: Medicare Other

## 2022-05-06 VITALS — BP 149/65 | HR 62 | Temp 97.4°F | Resp 22 | Ht 61.5 in | Wt 196.2 lb

## 2022-05-06 DIAGNOSIS — M25572 Pain in left ankle and joints of left foot: Secondary | ICD-10-CM | POA: Diagnosis not present

## 2022-05-06 DIAGNOSIS — M25571 Pain in right ankle and joints of right foot: Secondary | ICD-10-CM | POA: Diagnosis not present

## 2022-05-06 DIAGNOSIS — M546 Pain in thoracic spine: Secondary | ICD-10-CM | POA: Diagnosis not present

## 2022-05-06 DIAGNOSIS — M722 Plantar fascial fibromatosis: Secondary | ICD-10-CM

## 2022-05-06 DIAGNOSIS — M9902 Segmental and somatic dysfunction of thoracic region: Secondary | ICD-10-CM | POA: Diagnosis not present

## 2022-05-06 DIAGNOSIS — J4520 Mild intermittent asthma, uncomplicated: Secondary | ICD-10-CM | POA: Diagnosis not present

## 2022-05-06 DIAGNOSIS — M62830 Muscle spasm of back: Secondary | ICD-10-CM | POA: Diagnosis not present

## 2022-05-06 DIAGNOSIS — M9903 Segmental and somatic dysfunction of lumbar region: Secondary | ICD-10-CM | POA: Diagnosis not present

## 2022-05-06 DIAGNOSIS — M9901 Segmental and somatic dysfunction of cervical region: Secondary | ICD-10-CM | POA: Diagnosis not present

## 2022-05-06 MED ORDER — MEPOLIZUMAB 100 MG ~~LOC~~ SOLR
100.0000 mg | Freq: Once | SUBCUTANEOUS | Status: AC
  Administered 2022-05-06: 100 mg via SUBCUTANEOUS
  Filled 2022-05-06: qty 1

## 2022-05-06 NOTE — Progress Notes (Signed)
Patient presents to the office today with issues concerning their custom inserts. Patient states that they are bending when she puts them in her shoes and they are also to thick. Work Airline pilot from the previous pair and will be sent for corrections.  Will send orthotics to lab for adjustments.  Advised patient that she will receive a call from the office to come in for a fitting.

## 2022-05-06 NOTE — Progress Notes (Signed)
Diagnosis: Asthma  Provider:  Marshell Garfinkel MD  Procedure: Injection  Nucala (Mepolizumab), Dose: 100 mg, Site: subcutaneous, Number of injections: 1  Post Care:  n/a  Discharge: Condition: Good, Destination: Home . AVS Declined  Performed by:  Adelina Mings, LPN

## 2022-05-07 ENCOUNTER — Encounter (HOSPITAL_COMMUNITY): Payer: Self-pay | Admitting: *Deleted

## 2022-05-12 ENCOUNTER — Ambulatory Visit: Payer: Medicare Other | Admitting: Internal Medicine

## 2022-05-13 NOTE — Progress Notes (Signed)
HPI female never smoker followed for OSA, allergic rhinitis, asthma, Hyper IgE,  complicated by A. Fib/Coumadin, HBP, GERD, obesity NPSG prior to EMR in 2012 Office Spirometry 09/02/16-WNL. FVC 2.06/76%, FEV1 1.57/77%, ratio 0.76, FEF 25-75% 1.26/80% Allergy labs 09/02/16- eosinophils 200, total IgE 4230 causing elevation of all specific allergen antibody levels including Aspergillus. Nucala  + 2018 PFT-10/13/16-minimal obstruction without response to dilator, minimal reduction of diffusion. FVC 2.02/77%, FEV1 1.62/83%, ratio 0.80, TLC 92%, DLCO 77% CT soft tissue neck 09/22/16-1. 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  Subsegmental atelectasis and nonspecific linear patchy densities in the lungs as described. They have a benign appearance.   Small hyperdense masses in the mediastinum are stable compared with 2015 supporting benign etiology such as ectopic thyroid tissue.  Three-vessel prominent coronary artery calcification.  Bibasilar bronchiolectasis. Labs 09/20/2017-WBC 14,900, hemoglobin 11.2, eosinophils absent, lymphocytes low( ?steroids or viral?), BNP 220, CMET wnl IgE 04/12/2018- 2,574, 10/16/16- 4,302, 11/09/13- 3,513 Office Spirometry 04/12/2018-mild restriction of exhaled volume.  Possible mild obstructive airways.  FVC 1.9/73%, FEV1 1.4/75%, ratio 1.76, FEF 25-75% 1.2/80%  -------------------------------------------------------------------------------------------   02/09/22- -82 year old female never smoker followed for OSA, allergic Rhinitis, Asthma/ Nucala, Hyper IgE, complicated by PAFibXarelto, HBP, GERD, obesity, Anemia,  -Neb Pulmicort/  Duoneb   Anoro, Flonase, Zyrtec, Singulair,  CPAP 9/Adapt      AirSense 10 AutoSet Download- compliance 100%, AHI 2.8/ hr Body weight today- 198 lbs Covid vax- 5 Moderna Flu vax-had LOV 10/16, Walsh, NP- augmentin and prednisone for sinusitis/ asthma exacerb started in Sept. She  tends to keep some nasal stuffiness not relieved by Breathe Right strips, but managed better now with regular regimen of sinus rinse, Flonase and Astelin.  She improved after treatment with Augmentin and was clear by the time she saw the ENT who diagnosed "allergy". Felt she did very well with pulmonary rehabilitation and would like to do this again if possible. Atrial fibrillation pending third ablation. Download reviewed and discussed, good compliance and control. CT maxfac 01/19/22- IMPRESSION: Negative for sinusitis or sinus outflow obstruction. CXR 01/13/22- MPRESSION: Bronchitic changes without focal airspace opacity  05/18/22- 82 year old female never smoker followed for OSA, allergic Rhinitis, Asthma/ Nucala, Hyper IgE, complicated by PAFibXarelto, HBP, GERD, obesity, Anemia,  -Neb Pulmicort/  Duoneb   Anoro, Flonase, Zyrtec, Singulair,  CPAP 9/Adapt      AirSense 10 AutoSet Download- compliance 100%, AHI 3/ hr Body weight today- 195 lbs Covid vax- 5 Moderna Flu vax-had RSV vax- had Cardiac ablation 03/18/22 Increased postnasal drip in the last few weeks.  She is doing daily saline sinus rinse, using Astelin nasal spray twice daily and Flonase. Morning cough for few hours each day then clears.  Aware of some reflux on double acid blocker therapy.  Continuing maintenance pulmonary rehabilitation at [facility].  She cannot tell when her cough is affected by her heart, reflux or lung issues.  Continues Anoro. Download reviewed.  ROS-see HPI   + = positive Constitutional:   No-   weight loss, night sweats, fevers, chills, fatigue, lassitude. HEENT:   No-  headaches, difficulty swallowing, tooth/dental problems, sore throat,       No-  sneezing, itching,  ear ache,  +nasal congestion, post nasal drip,  CV:  No-   chest pain, orthopnea, PND, swelling in lower extremities, anasarca, dizziness, palpitations Resp: +  shortness of breath with exertion or at rest.                +  productive  cough,  + non-productive cough,  No- coughing up of blood.              No-change in color of mucus. + wheezing.   Skin: No-   rash or lesions. GI:  No-   heartburn, indigestion, abdominal pain, nausea, vomiting, GU:  MS:  No-   joint pain or swelling. . Neuro-     nothing unusual Psych:  No- change in mood or affect. No depression or anxiety.  No memory loss.  OBJ- Physical Exam General- Alert, Oriented, Affect-appropriate, Distress- none acute, + obese Skin- rash-none, lesions- none, excoriation- none Lymphadenopathy- none Head- atraumatic            Eyes- Gross vision intact, PERRLA, conjunctivae and secretions clear            Ears- Hearing, canals-normal            Nose- No turbinate edema, no-Septal dev, mucus, polyps, erosion, perforation             Throat- Mallampati III-IV , mucosa- not red,  drainage- none, tonsils- atrophic Neck- flexible , trachea midline, no stridor , thyroid nl, carotid no bruit Chest - symmetrical excursion , unlabored           Heart/CV- RR today/ hx AFib(no pacemaker) , no murmur , no gallop  , no rub, nl  s1 s2                  - JVD- none , edema- none, stasis changes- none, varices- none           Lung- + clear  Wheeze-none , cough+dry,                         dullness-none, rub- none           Chest wall-  Abd- Br/ Gen/ Rectal- Not done, not indicated Extrem- cyanosis- none, clubbing, none, atrophy- none, strength- nl Neuro- grossly intact to observation

## 2022-05-15 ENCOUNTER — Encounter: Payer: Self-pay | Admitting: Internal Medicine

## 2022-05-18 ENCOUNTER — Encounter: Payer: Self-pay | Admitting: Podiatry

## 2022-05-18 ENCOUNTER — Encounter: Payer: Self-pay | Admitting: Internal Medicine

## 2022-05-18 ENCOUNTER — Telehealth: Payer: Self-pay | Admitting: Pharmacy Technician

## 2022-05-18 ENCOUNTER — Ambulatory Visit (INDEPENDENT_AMBULATORY_CARE_PROVIDER_SITE_OTHER): Payer: Medicare Other | Admitting: Internal Medicine

## 2022-05-18 ENCOUNTER — Other Ambulatory Visit: Payer: Self-pay

## 2022-05-18 ENCOUNTER — Ambulatory Visit (INDEPENDENT_AMBULATORY_CARE_PROVIDER_SITE_OTHER): Payer: Medicare Other | Admitting: Podiatry

## 2022-05-18 VITALS — BP 130/62 | HR 61 | Temp 97.8°F | Ht 61.5 in | Wt 195.2 lb

## 2022-05-18 VITALS — BP 134/36

## 2022-05-18 DIAGNOSIS — B351 Tinea unguium: Secondary | ICD-10-CM

## 2022-05-18 DIAGNOSIS — M2061 Acquired deformities of toe(s), unspecified, right foot: Secondary | ICD-10-CM

## 2022-05-18 DIAGNOSIS — G4733 Obstructive sleep apnea (adult) (pediatric): Secondary | ICD-10-CM

## 2022-05-18 DIAGNOSIS — M79675 Pain in left toe(s): Secondary | ICD-10-CM

## 2022-05-18 DIAGNOSIS — J3089 Other allergic rhinitis: Secondary | ICD-10-CM

## 2022-05-18 DIAGNOSIS — M79674 Pain in right toe(s): Secondary | ICD-10-CM

## 2022-05-18 DIAGNOSIS — J4521 Mild intermittent asthma with (acute) exacerbation: Secondary | ICD-10-CM

## 2022-05-18 MED ORDER — UMECLIDINIUM-VILANTEROL 62.5-25 MCG/ACT IN AEPB: INHALATION_SPRAY | RESPIRATORY_TRACT | 11 refills | Status: DC

## 2022-05-18 MED ORDER — METHYLPREDNISOLONE ACETATE 80 MG/ML IJ SUSP: 80.0000 mg | Freq: Once | INTRAMUSCULAR | Status: AC

## 2022-05-18 NOTE — Telephone Encounter (Signed)
Patient would like to transfer services to [facility] due to commute/distance.  Auth Submission: NO AUTH NEEDED Payer: Medicare A/B & BCBS supp Medication & CPT/J Code(s) submitted: Nucala (Mepolizumab) [id number] Route of submission (phone, fax, portal):  Phone # Fax # Auth type: Buy/Bill Units/visits requested: q28 days Reference number:  Approval from: 05/18/22 to 03/30/23

## 2022-05-18 NOTE — Patient Instructions (Addendum)
Order Depo 20     dx Allergic Rhinitis  Script sent to [facility] for [employer]  Let us know when you need an Anoro script for [employer] assistance.  You are doing great with the CPAP.

## 2022-05-18 NOTE — Progress Notes (Signed)
  Subjective:  Patient ID: [name] [name], female    DOB: 10/12/1940,  MRN: [id number]  [name] [name] presents to clinic today for painful elongated mycotic toenails 1-5 bilaterally which are tender when wearing enclosed shoe gear. Pain is relieved with periodic professional debridement.  Chief Complaint  Patient presents with   Nail Problem    RFC A1C-6.8 PCP-Shah PCP VST- 2 weeks ago   New problem(s): None.   PCP is Monico Blitz, MD.  Allergies  Allergen Reactions   Bupropion Hcl Other (See Comments)    Suicidal Thoughts.    Escitalopram Oxalate Other (See Comments)    Suicidal Thoughts.    Fluticasone-Salmeterol Other (See Comments)    Advair - Caused patient to go into Afib.    Lisinopril Cough   Serevent Other (See Comments)    Caused patient to go into Afib    Review of Systems: Negative except as noted in the HPI.  Objective: No changes noted in today's physical examination. Vitals:   05/18/22 0946  BP: (!) 134/36   [name] [name] is a pleasant 82 y.o. female obese in NAD. AAO x 3.  Vascular CFT immediate b/l LE. Palpable DP/PT pulses b/l LE. Digital hair sparse b/l. Skin temperature gradient WNL b/l. No pain with calf compression b/l. Trace edema b/l LE. No cyanosis or clubbing noted b/l LE.  Neurologic Normal speech. Oriented to person, place, and time. Pt has subjective symptoms of neuropathy. Protective sensation intact 5/5 intact bilaterally with 10g monofilament b/l.  Dermatologic Pedal integument with normal turgor, texture and tone b/l LE. No open wounds b/l. No interdigital macerations b/l. Toenails 1-5 b/l elongated, thickened, discolored with subungual debris. +Tenderness with dorsal palpation of nailplates.   Hyperkeratotic lesion medial aspect right 2nd digit PIPJ due to rubbing of right hallux.  Orthopedic: Normal muscle strength 5/5 to all lower extremity muscle groups bilaterally. 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 Pes planus deformity  noted bilateral LE. ROM limited due to fusion of left 1st MPJ.  Patient ambulates with cane assistance.   Assessment/Plan: 1. Pain due to onychomycosis of toenails of both feet   2. Acquired deformity of right toe     -Patient was evaluated and treated. All patient's and/or POA's questions/concerns answered on today's visit. -Patient awaiting delivery of new pair of orthotics Orthotics and Prosthetics team have been working on. -Continue supportive shoe gear daily. -Mycotic toenails 1-5 bilaterally debrided in length and girth with sterile nail nippers without iatrogenic bleeding. Patient declined use of dremel. -As a courtesy, corn(s)  R 2nd toe gently filed without complication or incident. Total number pared=1. -Dispensed tube foam for right 2nd digit. Apply to left second digit every morning. Remove every evening. -Patient/POA to call should there be question/concern in the interim.   Return in about 9 weeks (around 07/20/2022).  Marzetta Board, DPM

## 2022-05-19 DIAGNOSIS — R21 Rash and other nonspecific skin eruption: Secondary | ICD-10-CM | POA: Diagnosis not present

## 2022-05-19 DIAGNOSIS — I10 Essential (primary) hypertension: Secondary | ICD-10-CM | POA: Diagnosis not present

## 2022-05-19 DIAGNOSIS — J449 Chronic obstructive pulmonary disease, unspecified: Secondary | ICD-10-CM | POA: Diagnosis not present

## 2022-05-19 DIAGNOSIS — E7800 Pure hypercholesterolemia, unspecified: Secondary | ICD-10-CM | POA: Diagnosis not present

## 2022-05-19 DIAGNOSIS — Z299 Encounter for prophylactic measures, unspecified: Secondary | ICD-10-CM | POA: Diagnosis not present

## 2022-05-21 ENCOUNTER — Telehealth: Payer: Self-pay | Admitting: Internal Medicine

## 2022-05-21 NOTE — Telephone Encounter (Signed)
Patient called to inform the doctor that her pharmacy still has not received her script for Anoro.  Please advise.  CB# [phone]

## 2022-05-22 MED ORDER — UMECLIDINIUM-VILANTEROL 62.5-25 MCG/ACT IN AEPB: INHALATION_SPRAY | RESPIRATORY_TRACT | 11 refills | Status: DC

## 2022-05-22 NOTE — Telephone Encounter (Signed)
Spoke with patient. Was able to get anoro resent to pharmacy. Nothing further needed.

## 2022-05-25 DIAGNOSIS — M9901 Segmental and somatic dysfunction of cervical region: Secondary | ICD-10-CM | POA: Diagnosis not present

## 2022-05-25 DIAGNOSIS — M62830 Muscle spasm of back: Secondary | ICD-10-CM | POA: Diagnosis not present

## 2022-05-25 DIAGNOSIS — M9902 Segmental and somatic dysfunction of thoracic region: Secondary | ICD-10-CM | POA: Diagnosis not present

## 2022-05-25 DIAGNOSIS — M9903 Segmental and somatic dysfunction of lumbar region: Secondary | ICD-10-CM | POA: Diagnosis not present

## 2022-05-25 DIAGNOSIS — M546 Pain in thoracic spine: Secondary | ICD-10-CM | POA: Diagnosis not present

## 2022-05-25 DIAGNOSIS — M25571 Pain in right ankle and joints of right foot: Secondary | ICD-10-CM | POA: Diagnosis not present

## 2022-05-25 DIAGNOSIS — M25572 Pain in left ankle and joints of left foot: Secondary | ICD-10-CM | POA: Diagnosis not present

## 2022-05-27 ENCOUNTER — Ambulatory Visit (INDEPENDENT_AMBULATORY_CARE_PROVIDER_SITE_OTHER): Payer: Medicare Other

## 2022-05-27 ENCOUNTER — Other Ambulatory Visit (HOSPITAL_COMMUNITY): Payer: Self-pay | Admitting: Cardiology

## 2022-05-27 DIAGNOSIS — I10 Essential (primary) hypertension: Secondary | ICD-10-CM | POA: Diagnosis not present

## 2022-05-27 DIAGNOSIS — M722 Plantar fascial fibromatosis: Secondary | ICD-10-CM

## 2022-05-27 NOTE — Progress Notes (Signed)
Patient presents today to pick up custom molded foot orthotics recommended by Dr. Jacqualyn Posey.   Orthotics were dispensed and fit was satisfactory. Reviewed instructions for break-in and wear. Written instructions given to patient.  Patient will follow up as needed.

## 2022-06-01 ENCOUNTER — Encounter (INDEPENDENT_AMBULATORY_CARE_PROVIDER_SITE_OTHER): Payer: Self-pay | Admitting: Gastroenterology

## 2022-06-01 ENCOUNTER — Ambulatory Visit (INDEPENDENT_AMBULATORY_CARE_PROVIDER_SITE_OTHER): Payer: Medicare Other

## 2022-06-01 ENCOUNTER — Ambulatory Visit (INDEPENDENT_AMBULATORY_CARE_PROVIDER_SITE_OTHER): Payer: Medicare Other | Admitting: Gastroenterology

## 2022-06-01 VITALS — BP 120/65 | HR 56 | Temp 97.6°F | Ht 61.5 in | Wt 195.3 lb

## 2022-06-01 DIAGNOSIS — R09A2 Foreign body sensation, throat: Secondary | ICD-10-CM

## 2022-06-01 DIAGNOSIS — R001 Bradycardia, unspecified: Secondary | ICD-10-CM

## 2022-06-01 DIAGNOSIS — K219 Gastro-esophageal reflux disease without esophagitis: Secondary | ICD-10-CM | POA: Diagnosis not present

## 2022-06-01 DIAGNOSIS — I480 Paroxysmal atrial fibrillation: Secondary | ICD-10-CM

## 2022-06-01 DIAGNOSIS — R058 Other specified cough: Secondary | ICD-10-CM | POA: Diagnosis not present

## 2022-06-01 LAB — CUP PACEART REMOTE DEVICE CHECK
Date Time Interrogation Session: 20240229232013
Implantable Pulse Generator Implant Date: 20220826

## 2022-06-01 NOTE — Patient Instructions (Addendum)
We will discuss EGD further after your ablation follow up as I need to ensure that cardiology feels comfortable with you holding your blood thinner and undergoing procedure You can try decreasing omeprazole to once daily x1 week, if symptoms worsen, please return back to twice daily dosing. You can continue famotidine '40mg'$  as needed for breakthrough symptoms Avoid overly cold foods/liquids. Try to stay upright 2-3 hours after eating prior to lying down.  Be mindful that greasy, spicy, fried, citrus foods, caffeine, carbonated drinks, chocolate and alcohol can increase reflux symptoms  I am providing a brochure about the TIF procedure for you to look over  Follow up 3 months  It was a pleasure to see you today. I want to create trusting relationships with patients and provide genuine, compassionate, and quality care. I truly value your feedback! please be on the lookout for a survey regarding your visit with me today. I appreciate your input about our visit and your time in completing this!    Darcie Mellone L. Alver Sorrow, MSN, APRN, AGNP-C Adult-Gerontology Nurse Practitioner [facility] at [facility]

## 2022-06-01 NOTE — Progress Notes (Addendum)
Referring Provider: Monico Blitz, MD Primary Care Physician:  Monico Blitz, MD Primary GI Physician: Jenetta Downer   Chief Complaint  Patient presents with   Gastroesophageal Reflux    Follow up on reflux. Doing about the same as the last visit. Cough is better now. Was coughing every time she ate but now only coughing at night.    HPI:   [name] [name] is a 82 y.o. female with past medical history of anxiety, asthma, A flutter, COPD, depression, HTN, HLD, OSA.     Patient presenting today for follow up of GERD/Cough  Last seen December 2023, at that time, reported back in the summer she was having a lot of coughing with eating and coughing up a lot of phlegm, she started taking a probiotic which helped some. she still occasionally has some coughing after she eats, at times will occur during her meal. Sometimes cold stuff and dry foods like crackers will bring it on. Feels that coughing now occurs mostly at night. She reports that she often sits back in her recliner after eating even though she knows she shouldnt. Did not feel much improvement with switch from protonix to omeprazole. has rare heartburn, though the last few weeks she has had more acid regurgitation. taking pepcid PRN, though taking tums daily at this time. She has had some sore throats but also a lot of issues with her sinuses as well so unsure if this is from that or GERD.  Has occasional gas pains in lower abdomen improved with passing flatus.  upcoming cardiac ablation in December which will be her third one.   Recommend to continue daily probiotic, possible EGD after cardiac ablation, continue omeprazole '40mg'$ , increase to BID, continue pepcid '20mg'$  PRN, reflux precautions, avoid tums.   Present:  Patient states that GERD is doing fair. Coughing with meals has improved some. She is still having issues with coughing when eating crackers/chips. She notes that she has worsening cough at night as she is not able to sit upright  very long after eating. She notes continued globus sensation after eating several times per week. Cannot pinpoint if this is related to certain foods or not. She is having globus sensation now, reports eating a biscuit this morning, had [place] yesterday. No dysphagia. She notes that raw veggies sometimes make her cough as well.  She denies overt heartburn or acid regurgitation. She had some previous nausea that has improved. No vomiting. She does note she coughs up phlegm often and has a lot of sinus issues/post nasal drip. She is doing omeprazole '40mg'$  BID and pepcid '40mg'$  maybe twice per week in the evenings.   She had cardiac ablation in December, her monitor showed that she is continuing to have some a fib almost daily. She is doing respiratory therapy at the hospital two days per week. She cannot come off of her blood thinner until after sometime In April. Has ongoing fatigue secondary to her A fib.   Bowels are very regular, having 1-2 BMs per day. No rectal bleeding or melena.   Last colonoscopy: 06/09/12 Examination performed to cecum. Few small diverticula at sigmoid colon. Small external hemorrhoids.  Last Endoscopy:08/27/16- Multiple whitish plaques in the upper third of the esophagus. Brushing obtained for KOH. - Normal middle third of esophagus, lower third of esophagus and gastroesophageal junction. - Z-line regular, 39 cm from the incisors. - 2 cm hiatal hernia. - Multiple gastric polyps. Four biopsied. - Normal duodenal bulb and second portion of the duodenum.  Recommendations:  Repeat TCS March 2024   Past Medical History:  Diagnosis Date   Allergic rhinitis    Anxiety    Asthma    Atrial flutter (HCC)    COPD (chronic obstructive pulmonary disease) ([place])    Depression    Essential hypertension    Hyperlipidemia    Lumbar disc disease    Obesity    OSA (obstructive sleep apnea)    CPAP   Paroxysmal atrial fibrillation (HCC)    Element of tachycardia bradycardia  syndrome   Respiratory failure ([place])     Past Surgical History:  Procedure Laterality Date   ANTERIOR FUSION LUMBAR SPINE     ATRIAL FIBRILLATION ABLATION N/A 09/12/2020   Procedure: ATRIAL FIBRILLATION ABLATION;  Surgeon: Thompson Grayer, MD;  Location: [place] [facility];  Service: Cardiovascular;  Laterality: N/A;   ATRIAL FIBRILLATION ABLATION N/A 03/18/2022   Procedure: ATRIAL FIBRILLATION ABLATION;  Surgeon: Melida Quitter, MD;  Location: [place] [facility];  Service: Cardiovascular;  Laterality: N/A;   BIOPSY  08/27/2016   Procedure: BIOPSY;  Surgeon: Rogene Houston, MD;  [facility];  Service: Endoscopy;;  FOUR GASTRIC POLYPS BIOPSIED   CARDIOVERSION N/A 03/28/2014   Procedure: CARDIOVERSION;  Surgeon: Fay Records, MD;  Location: [facility];  Service: Cardiovascular;  Laterality: N/A;   CARDIOVERSION N/A 10/22/2016   Procedure: CARDIOVERSION;  Surgeon: Sueanne Margarita, MD;  Location: [facility] ENDOSCOPY;  Service: Cardiovascular;  Laterality: N/A;   CARDIOVERSION N/A 11/26/2020   Procedure: CARDIOVERSION;  Surgeon: Fay Records, MD;  Location: [place];  Service: Cardiovascular;  Laterality: N/A;   CATARACT EXTRACTION     COLONOSCOPY N/A 08/04/2012   Procedure: COLONOSCOPY;  Surgeon: Rogene Houston, MD;  [facility];  Service: Endoscopy;  Laterality: N/A;  730-rescheduled to [place] notified pt   ELECTROPHYSIOLOGIC STUDY N/A 09/18/2014   Procedure: Atrial Fibrillation Ablation;  Surgeon: Thompson Grayer, MD;  Location: [place] [facility];  Service: Cardiovascular;  Laterality: N/A;   ESOPHAGOGASTRODUODENOSCOPY N/A 08/27/2016   Procedure: ESOPHAGOGASTRODUODENOSCOPY (EGD);  Surgeon: Rogene Houston, MD;  [facility];  Service: Endoscopy;  Laterality: N/A;  1200   implantable loop recorder placement  11/22/2020   Medtronic Reveal Linq model LNQ 11 (SN [id number]) implantable loop recorder   LASIK     Both eyes   TEE WITHOUT CARDIOVERSION N/A  09/18/2014   Procedure: TRANSESOPHAGEAL ECHOCARDIOGRAM (TEE);  Surgeon: Larey Dresser, MD;  Location: [place];  Service: Cardiovascular;  Laterality: N/A;   TEE WITHOUT CARDIOVERSION N/A 10/22/2016   Procedure: TRANSESOPHAGEAL ECHOCARDIOGRAM (TEE);  Surgeon: Sueanne Margarita, MD;  Location: [facility] ENDOSCOPY;  Service: Cardiovascular;  Laterality: N/A;   TEE WITHOUT CARDIOVERSION N/A 08/24/2017   Procedure: TRANSESOPHAGEAL ECHOCARDIOGRAM (TEE);  Surgeon: Josue Hector, MD;  Location: [facility] ENDOSCOPY;  Service: Cardiovascular;  Laterality: N/A;   TOTAL HIP ARTHROPLASTY  03/30/2008   Right    Current Outpatient Medications  Medication Sig Dispense Refill   antiseptic oral rinse (BIOTENE) LIQD 15 mLs by Mouth Rinse route daily as needed for dry mouth.     atorvastatin (LIPITOR) 10 MG tablet Take 10 mg by mouth daily at 6 PM.     azelastine (ASTELIN) 0.1 % nasal spray Place 2 sprays into both nostrils 2 (two) times daily. Use in each nostril as directed (Patient taking differently: Place 2 sprays into both nostrils daily. Use in each nostril as directed) 30 mL 11   Bacillus  Coagulans-Inulin (PROBIOTIC-PREBIOTIC PO) Take 1 capsule by mouth daily.     benzonatate (TESSALON) 200 MG capsule Take 1 capsule (200 mg total) by mouth 3 (three) times daily as needed for cough. 30 capsule 3   Biotin 1 MG CAPS Take 1,000 mg by mouth 3 (three) times a week.      budesonide (PULMICORT) 0.5 MG/2ML nebulizer solution Take 0.5 mg by nebulization daily.     Calcium Carbonate-Vit D-Min (QC CALCIUM-MAGNESIUM-ZINC-D3) 333.4-133 MG-UNIT TABS Take 1 tablet by mouth 2 (two) times daily.      cetirizine (ZYRTEC) 10 MG tablet Take 10 mg by mouth at bedtime as needed for allergies.     clindamycin (CLEOCIN) 300 MG capsule Take 600 mg by mouth See admin instructions. Take prior to dental procedures  0   diltiazem (CARDIZEM CD) 120 MG 24 hr capsule Take 1 capsule (120 mg total) by mouth at bedtime.     diltiazem (CARDIZEM CD)  240 MG 24 hr capsule TAKE ONE CAPSULE BY MOUTH EVERY MORNING 30 capsule 5   diltiazem (CARDIZEM) 30 MG tablet TAKE 1 TABLET BY MOUTH AS NEEDED FOR PALPITATIONS 45 tablet 2   dofetilide (TIKOSYN) 500 MCG capsule Take 1 capsule (500 mcg total) by mouth 2 (two) times daily. 180 capsule 1   EPINEPHrine 0.3 mg/0.3 mL IJ SOAJ injection Inject 0.3 mg into the muscle as needed for anaphylaxis.     famotidine (PEPCID) 20 MG tablet Take 2 tablets (40 mg total) by mouth at bedtime as needed. 30 tablet 1   fluticasone (FLONASE) 50 MCG/ACT nasal spray Place 1 spray into both nostrils 2 (two) times daily.     hydrALAZINE (APRESOLINE) 100 MG tablet Take 100 mg by mouth 2 (two) times daily.     HYDROcodone bit-homatropine (HYCODAN) 5-1.5 MG/5ML syrup Take 5 mLs by mouth every 6 (six) hours as needed for cough.     Hypertonic Nasal Wash (SINUS RINSE) PACK Place 1 each into the nose daily as needed (clear nasal passage). Neilmed sinus rinse     ipratropium (ATROVENT) 0.03 % nasal spray INSTILL TWO SPRAYS INTO NOSTRILS TWICE DAILY     ipratropium-albuterol (DUONEB) 0.5-2.5 (3) MG/3ML SOLN Take 3 mLs by nebulization daily.     losartan (COZAAR) 100 MG tablet TAKE 1 TABLET BY MOUTH DAILY 90 tablet 1   Magnesium 250 MG TABS Take 250 mg by mouth daily.     montelukast (SINGULAIR) 10 MG tablet Take 10 mg by mouth at bedtime.     omeprazole (PRILOSEC) 40 MG capsule Take 1 capsule (40 mg total) by mouth 2 (two) times daily. 180 capsule 1   Polyethyl Glyc-Propyl Glyc PF (SYSTANE ULTRA PF) 0.4-0.3 % SOLN Place 1 drop into both eyes as needed (dry eyes).     Polyethyl Glycol-Propyl Glycol (SYSTANE) 0.4-0.3 % GEL ophthalmic gel Place 1 Application into both eyes as needed (dry eyes).     potassium chloride SA (KLOR-CON M) 20 MEQ tablet TAKE 1 TABLET BY MOUTH DAILY 90 tablet 3   rivaroxaban (XARELTO) 20 MG TABS tablet Take 1 tablet (20 mg total) by mouth daily with supper. 90 tablet 3   umeclidinium-vilanterol (ANORO ELLIPTA)  62.5-25 MCG/ACT AEPB INHALE ONE PUFF INTO THE LUNGS DAILY 60 each 11   Wheat Dextrin (BENEFIBER ON THE GO) POWD Take 10 mLs by mouth daily.     White Petrolatum-Mineral Oil (SYSTANE NIGHTTIME) OINT Place 1 drop into both eyes at bedtime.     Current Facility-Administered Medications  Medication Dose  Route Frequency Provider Last Rate Last Admin   methylPREDNISolone acetate (DEPO-MEDROL) injection 80 mg  80 mg Intramuscular Once Baird Lyons D, MD        Allergies as of 06/01/2022 - Review Complete 06/01/2022  Allergen Reaction Noted   Bupropion hcl Other (See Comments)    Escitalopram oxalate Other (See Comments)    Fluticasone-salmeterol Other (See Comments)    Lisinopril Cough    Serevent Other (See Comments) 05/06/2011    Family History  Problem Relation Age of Onset   Cancer Mother    Heart attack Father    Colon cancer Other     Social History   Socioeconomic History   Marital status: Single    Spouse name: Not on file   Number of children: Not on file   Years of education: Not on file   Highest education level: Not on file  Occupational History   Occupation: Retired: Presenter, broadcasting  Tobacco Use   Smoking status: Never    Passive exposure: Past   Smokeless tobacco: Never  Vaping Use   Vaping Use: Never used  Substance and Sexual Activity   Alcohol use: No    Alcohol/week: 0.0 standard drinks of alcohol   Drug use: No   Sexual activity: Not on file  Other Topics Concern   Not on file  Social History Narrative   Pt lives in [place] alone. She was never married.   Retired Customer service manager.   Attends [employer]   Social Determinants of Health   Financial Resource Strain: Not on file  Food Insecurity: Not on file  Transportation Needs: Not on file  Physical Activity: Not on file  Stress: Not on file  Social Connections: Not on file   Review of systems General: negative for malaise, night sweats, fever, chills, weight loss Neck: Negative  for lumps, goiter, pain and significant neck swelling Resp: Negative for cough, wheezing, dyspnea at rest CV: Negative for chest pain, leg swelling, palpitations, orthopnea GI: denies melena, hematochezia, nausea, vomiting, diarrhea, constipation, dysphagia, odyonophagia, early satiety or unintentional weight loss. +globus sensation +coughing with eating MSK: Negative for joint pain or swelling, back pain, and muscle pain. Derm: Negative for itching or rash Psych: Denies depression, anxiety, memory loss, confusion. No homicidal or suicidal ideation.  Heme: Negative for prolonged bleeding, bruising easily, and swollen nodes. Endocrine: Negative for cold or heat intolerance, polyuria, polydipsia and goiter. Neuro: negative for tremor, gait imbalance, syncope and seizures. The remainder of the review of systems is noncontributory.  Physical Exam: BP 120/65 (BP Location: Left Arm, Patient Position: Sitting, Cuff Size: Large)   Pulse (!) 56   Temp 97.6 F (36.4 C) (Oral)   Ht 5' 1.5" (1.562 m)   Wt 195 lb 4.8 oz (88.6 kg)   BMI 36.30 kg/m  General:   Alert and oriented. No distress noted. Pleasant and cooperative.  Head:  Normocephalic and atraumatic. Eyes:  Conjuctiva clear without scleral icterus. Mouth:  Oral mucosa pink and moist. Good dentition. No lesions. Heart: Normal rate and rhythm, s1 and s2 heart sounds present.  Lungs: Clear lung sounds in all lobes. Respirations equal and unlabored. Abdomen:  +BS, soft, non-tender and non-distended. No rebound or guarding. No HSM or masses noted. Derm: No palmar erythema or jaundice Msk:  Symmetrical without gross deformities. Normal posture. Extremities:  Without edema. Neurologic:  Alert and  oriented x4 Psych:  Alert and cooperative. Normal mood and affect.  Invalid input(s): "6 MONTHS"   ASSESSMENT:  [name] [name] is a 82 y.o. female presenting today for follow up of GERD.  Patient continues to have coughing after eating and  intermittent globus sensation. no dysphagia or odynophagia. She is not really having acid regurgitation or heartburn. Notably is not adhering to strict reflux precautions.  Given her ongoing symptoms, not well controlled with PPI therapy, I previously recommended proceeding with EGD for further evaluation, however, this has been delayed due to issues with ongoing a fib requiring cardiac ablation in december. She has follow up with cardiology in April, will need to wait until after this evaluation to determine possible timing of EGD when/if she is cleared from cardiac standpoint to hold her Xarelto prior to procedure, I will forward my note today to Cardiology for their input after her upcoming visit.  I do consider that her symptoms are likely related to a combination of GERD and post nasal drip. She does express concerns about remaining on high dose of PPI therapy.  Patient was re-assured regarding PPI use and safety of when appropriate indications in question. Most recent studies on PPI therapy that association of symptoms is not equivalent to causation and overall association with for example osteoporosis is weak and based on observational studies. When PPI use is indicated, it is safe to proceed with therapy and titrate dosing/use based on symptom response. We can trial backing down to once daily PPI with H2B PRN, however, if symptoms worsen, she is encouraged to resume twice daily dosing of her PPI. I briefly discussed the TIF procedure with her and will provide brochure as this may be an option for her in the future given requirements for high doses of PPI and H2B therapy with refractory symptoms.   Last colonoscopy in 2014, we discussed given age and multimorbidities, risks of repeating this would likely outweigh benefits. Patient is amenable to not proceed with any further screening colonoscopies.     PLAN:  Can trial down to once daily PPI dosing x1 week, resume if symptoms worse  2.  Continue pepcid  '40mg'$  PRN  3. Continue reflux precautions  4. Consider TIF procedure in the future, brochure provided  5. Potential EGD once cleared by Cardiology  All questions were answered, patient verbalized understanding and is in agreement with plan as outlined above.   Follow Up: 3 months   Rion Schnitzer L. Alver Sorrow, MSN, APRN, AGNP-C Adult-Gerontology Nurse Practitioner [facility]  I have reviewed the note and agree with the APP's assessment as described in this progress note.  Patient has presented some episodes of choking in the past, if this persists, may consider also performing a MBS.  Maylon Peppers, MD Gastroenterology and Hepatology [facility]

## 2022-06-01 NOTE — Addendum Note (Signed)
Addended by: Harvel Quale on: 06/01/2022 07:46 PM   Modules accepted: Level of Service

## 2022-06-03 ENCOUNTER — Ambulatory Visit: Payer: Medicare Other | Admitting: Podiatry

## 2022-06-03 ENCOUNTER — Ambulatory Visit: Payer: Medicare Other

## 2022-06-04 ENCOUNTER — Encounter (HOSPITAL_COMMUNITY)
Admission: RE | Admit: 2022-06-04 | Discharge: 2022-06-04 | Disposition: A | Discharge status: Home / Self Care | Payer: Medicare Other | Source: Ambulatory Visit | Attending: Internal Medicine | Admitting: Internal Medicine

## 2022-06-04 VITALS — BP 129/53 | HR 61 | Temp 98.1°F | Resp 20

## 2022-06-04 DIAGNOSIS — J4520 Mild intermittent asthma, uncomplicated: Secondary | ICD-10-CM | POA: Diagnosis not present

## 2022-06-04 MED ORDER — MEPOLIZUMAB 100 MG ~~LOC~~ SOLR
100.0000 mg | Freq: Once | SUBCUTANEOUS | Status: AC
  Administered 2022-06-04: 100 mg via SUBCUTANEOUS
  Filled 2022-06-04: qty 1

## 2022-06-04 NOTE — Addendum Note (Signed)
Encounter addended by: Baxter Hire, RN on: 06/04/2022 3:09 PM  Actions taken: Therapy plan modified

## 2022-06-04 NOTE — Progress Notes (Signed)
Diagnosis: Asthma  Provider:   Dr. Baird Lyons  Procedure: Injection  Nucala (Mepolizumab), Dose: 100 mg, Site: subcutaneous, Number of injections: 1  Administered in right arm.  Post Care: Patient declined observation  Discharge: Condition: Good, Destination: Home . AVS Declined  Performed by:  Binnie Kand, RN

## 2022-06-09 NOTE — Progress Notes (Signed)
Carelink Summary Report / Loop Recorder 

## 2022-06-12 ENCOUNTER — Encounter: Payer: Self-pay | Admitting: Internal Medicine

## 2022-06-12 DIAGNOSIS — C44722 Squamous cell carcinoma of skin of right lower limb, including hip: Secondary | ICD-10-CM | POA: Diagnosis not present

## 2022-06-12 DIAGNOSIS — L718 Other rosacea: Secondary | ICD-10-CM | POA: Diagnosis not present

## 2022-06-12 DIAGNOSIS — Z08 Encounter for follow-up examination after completed treatment for malignant neoplasm: Secondary | ICD-10-CM | POA: Diagnosis not present

## 2022-06-12 DIAGNOSIS — L814 Other melanin hyperpigmentation: Secondary | ICD-10-CM | POA: Diagnosis not present

## 2022-06-12 DIAGNOSIS — D225 Melanocytic nevi of trunk: Secondary | ICD-10-CM | POA: Diagnosis not present

## 2022-06-12 DIAGNOSIS — L821 Other seborrheic keratosis: Secondary | ICD-10-CM | POA: Diagnosis not present

## 2022-06-12 DIAGNOSIS — Z85828 Personal history of other malignant neoplasm of skin: Secondary | ICD-10-CM | POA: Diagnosis not present

## 2022-06-12 DIAGNOSIS — D485 Neoplasm of uncertain behavior of skin: Secondary | ICD-10-CM | POA: Diagnosis not present

## 2022-06-12 NOTE — Assessment & Plan Note (Signed)
Nonspecific increased cough.  We will try Depo-Medrol today to see if that helps, as a clue to etiology. Plan-Depo-Medrol

## 2022-06-12 NOTE — Assessment & Plan Note (Signed)
Benefits from CPAP with good compliance and control Plan- continue CPAP 9 

## 2022-06-17 ENCOUNTER — Ambulatory Visit: Payer: Medicare Other | Admitting: Podiatry

## 2022-06-18 ENCOUNTER — Telehealth: Payer: Self-pay | Admitting: Internal Medicine

## 2022-06-18 NOTE — Telephone Encounter (Signed)
Noted - will await paperwork

## 2022-06-22 DIAGNOSIS — M9902 Segmental and somatic dysfunction of thoracic region: Secondary | ICD-10-CM | POA: Diagnosis not present

## 2022-06-22 DIAGNOSIS — M546 Pain in thoracic spine: Secondary | ICD-10-CM | POA: Diagnosis not present

## 2022-06-22 DIAGNOSIS — M9903 Segmental and somatic dysfunction of lumbar region: Secondary | ICD-10-CM | POA: Diagnosis not present

## 2022-06-22 DIAGNOSIS — M62830 Muscle spasm of back: Secondary | ICD-10-CM | POA: Diagnosis not present

## 2022-06-22 DIAGNOSIS — M25572 Pain in left ankle and joints of left foot: Secondary | ICD-10-CM | POA: Diagnosis not present

## 2022-06-22 DIAGNOSIS — M9901 Segmental and somatic dysfunction of cervical region: Secondary | ICD-10-CM | POA: Diagnosis not present

## 2022-06-22 DIAGNOSIS — M25571 Pain in right ankle and joints of right foot: Secondary | ICD-10-CM | POA: Diagnosis not present

## 2022-06-23 ENCOUNTER — Telehealth: Payer: Self-pay | Admitting: Internal Medicine

## 2022-06-23 NOTE — Telephone Encounter (Signed)
Patient checking on paperwork faxed for Anoro [employer]. Faxed 06/22/2022. Patient phone number is [phone].

## 2022-06-23 NOTE — Telephone Encounter (Signed)
Pharmacy team please advise? Do you handle the [place] forms for inhalers?

## 2022-06-24 NOTE — Telephone Encounter (Signed)
Call back patient with fax number

## 2022-06-25 ENCOUNTER — Ambulatory Visit (INDEPENDENT_AMBULATORY_CARE_PROVIDER_SITE_OTHER): Payer: Medicare Other | Admitting: Gastroenterology

## 2022-06-25 NOTE — Telephone Encounter (Signed)
Pt. Calling back about paperwork and please advise pt.

## 2022-06-27 DIAGNOSIS — I10 Essential (primary) hypertension: Secondary | ICD-10-CM | POA: Diagnosis not present

## 2022-06-30 DIAGNOSIS — I10 Essential (primary) hypertension: Secondary | ICD-10-CM | POA: Diagnosis not present

## 2022-06-30 DIAGNOSIS — M8580 Other specified disorders of bone density and structure, unspecified site: Secondary | ICD-10-CM | POA: Diagnosis not present

## 2022-06-30 DIAGNOSIS — I480 Paroxysmal atrial fibrillation: Secondary | ICD-10-CM | POA: Diagnosis not present

## 2022-06-30 DIAGNOSIS — Z299 Encounter for prophylactic measures, unspecified: Secondary | ICD-10-CM | POA: Diagnosis not present

## 2022-06-30 DIAGNOSIS — L719 Rosacea, unspecified: Secondary | ICD-10-CM | POA: Diagnosis not present

## 2022-06-30 NOTE — Telephone Encounter (Signed)
Pt called the office to check to see if we ever received the pt assistance paperwork for her Anoro from [place]. Checked with Arby Barrette to see if she ever remembered seeing this and Arby Barrette said that she does not remember seeing any paperwork.  Pt said that she would personally hand-deliver the paperwork to the office since there have been issues with [place] receiving the paperwork. Told pt that Arby Barrette is Dr. Janee Morn main nurse so she said she will personally ask for her when she comes to the office so paperwork can be hand-delivered to [place].  When paperwork has been all filled out and faxed to [place], pt does want to have a phone call so she knows that everything has been taken care of.  With [place] patient assistance, an Rx will need to be printed out and signed by provider and faxed along with all the paperwork that pt drops off.  Routing encounter to [place].

## 2022-07-01 ENCOUNTER — Ambulatory Visit: Payer: Medicare Other

## 2022-07-02 ENCOUNTER — Encounter (HOSPITAL_COMMUNITY)
Admission: RE | Admit: 2022-07-02 | Discharge: 2022-07-02 | Disposition: A | Discharge status: Home / Self Care | Payer: Medicare Other | Source: Ambulatory Visit | Attending: Internal Medicine | Admitting: Internal Medicine

## 2022-07-02 VITALS — BP 127/41 | HR 65 | Temp 97.3°F | Resp 20

## 2022-07-02 DIAGNOSIS — G4733 Obstructive sleep apnea (adult) (pediatric): Secondary | ICD-10-CM | POA: Insufficient documentation

## 2022-07-02 DIAGNOSIS — I444 Left anterior fascicular block: Secondary | ICD-10-CM | POA: Insufficient documentation

## 2022-07-02 DIAGNOSIS — I10 Essential (primary) hypertension: Secondary | ICD-10-CM | POA: Diagnosis not present

## 2022-07-02 DIAGNOSIS — I484 Atypical atrial flutter: Secondary | ICD-10-CM | POA: Insufficient documentation

## 2022-07-02 DIAGNOSIS — J4520 Mild intermittent asthma, uncomplicated: Secondary | ICD-10-CM | POA: Diagnosis not present

## 2022-07-02 DIAGNOSIS — I4819 Other persistent atrial fibrillation: Secondary | ICD-10-CM | POA: Insufficient documentation

## 2022-07-02 MED ORDER — MEPOLIZUMAB 100 MG ~~LOC~~ SOLR
100.0000 mg | Freq: Once | SUBCUTANEOUS | Status: AC
  Administered 2022-07-02: 100 mg via SUBCUTANEOUS
  Filled 2022-07-02: qty 1

## 2022-07-02 NOTE — Addendum Note (Signed)
Encounter addended by: Binnie Kand, RN on: 07/02/2022 10:46 AM  Actions taken: Therapy plan modified

## 2022-07-02 NOTE — Progress Notes (Signed)
Diagnosis: Asthma  Provider:   Dr. Baird Lyons  Procedure: Injection  Nucala (Mepolizumab), Dose: 100 mg, Site: subcutaneous, Number of injections: 1  Post Care: Observation period completed  Discharge: Condition: Good, Destination: Home . AVS Provided and AVS Declined  Performed by:  Oren Beckmann, RN

## 2022-07-03 ENCOUNTER — Ambulatory Visit: Payer: Medicare Other | Attending: Cardiovascular Disease | Admitting: Cardiovascular Disease

## 2022-07-03 ENCOUNTER — Encounter: Payer: Self-pay | Admitting: Cardiovascular Disease

## 2022-07-03 VITALS — BP 140/60 | HR 54 | Ht 61.0 in | Wt 195.2 lb

## 2022-07-03 DIAGNOSIS — M25571 Pain in right ankle and joints of right foot: Secondary | ICD-10-CM | POA: Diagnosis not present

## 2022-07-03 DIAGNOSIS — M9901 Segmental and somatic dysfunction of cervical region: Secondary | ICD-10-CM | POA: Diagnosis not present

## 2022-07-03 DIAGNOSIS — I480 Paroxysmal atrial fibrillation: Secondary | ICD-10-CM | POA: Insufficient documentation

## 2022-07-03 DIAGNOSIS — M9903 Segmental and somatic dysfunction of lumbar region: Secondary | ICD-10-CM | POA: Diagnosis not present

## 2022-07-03 DIAGNOSIS — M62830 Muscle spasm of back: Secondary | ICD-10-CM | POA: Diagnosis not present

## 2022-07-03 DIAGNOSIS — M9902 Segmental and somatic dysfunction of thoracic region: Secondary | ICD-10-CM | POA: Diagnosis not present

## 2022-07-03 DIAGNOSIS — M546 Pain in thoracic spine: Secondary | ICD-10-CM | POA: Diagnosis not present

## 2022-07-03 DIAGNOSIS — M25572 Pain in left ankle and joints of left foot: Secondary | ICD-10-CM | POA: Diagnosis not present

## 2022-07-03 NOTE — Progress Notes (Signed)
Cardiology Office Note:    Date:  07/03/2022   ID:  [name] [name], DOB 22-Apr-1940, MRN [id number]  PCP:  Kirstie PeriShah, Ashish, MD   [facility] Providers Cardiologist:  Nona DellSamuel McDowell, MD Electrophysiologist:  Maurice SmallAugustus E Brittie Whisnant, MD     Referring MD: Kirstie PeriShah, Ashish, MD   Chief complaint: fatigue, shortness of breath  History of Present Illness:    [name] ShinCarolyn L [name] is a 82 y.o. female with a hx of paroxysmal AF, atrial flutter, COPD, OSA who presents for EP follow-up.  She is a former patient of Dr. Johney FrameAllred and underwent an AF ablation in June, 2022. There was some reconnection of the right inferior pulmonary vein and the left superior veins. These were re-ablated. Additionally, a posterior wall box lesion set was created.  She has been managed on Tikosyn and xarelto since. She saw Dr. Diona BrownerMcDowell in follow-up a few days ago and noted that she has not been feeling well recently. Her ILR has shown an increase in AF burden, now 65% (9/23 month 50%, 8/23 was 25%). ECG during that clinic visit showed an atypical flutter with a cycle length of about 300 msec.  She underwent a 3rd AF ablation in December, 2023.  At that time, there was a small amount of breakthrough at the inferior line of the posterior left atrial box.  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  I reinforced the prior posterior box lesion set and created a mitral line.  She reports that she has begun pulmonary rehab and feels that her shortness of breath has improved with this.  Reviewed her loop interrogation.  She has been having on average 1 episode of A-fib lasting between 20 and 40 minutes about every day.  No chest pain, syncope, pre-syncope.   Past Medical History:  Diagnosis Date   Allergic rhinitis    Anxiety    Asthma    Atrial flutter    COPD (chronic obstructive pulmonary disease)    Depression    Essential hypertension    Hyperlipidemia    Lumbar disc disease     Obesity    OSA (obstructive sleep apnea)    CPAP   Paroxysmal atrial fibrillation    Element of tachycardia bradycardia syndrome   Respiratory failure     Past Surgical History:  Procedure Laterality Date   ANTERIOR FUSION LUMBAR SPINE     ATRIAL FIBRILLATION ABLATION N/A 09/12/2020   Procedure: ATRIAL FIBRILLATION ABLATION;  Surgeon: Hillis RangeAllred, James, MD;  Location: [facility];  Service: Cardiovascular;  Laterality: N/A;   ATRIAL FIBRILLATION ABLATION N/A 03/18/2022   Procedure: ATRIAL FIBRILLATION ABLATION;  Surgeon: Maurice SmallMealor, River Mckercher E, MD;  Location: [facility];  Service: Cardiovascular;  Laterality: N/A;   BIOPSY  08/27/2016   Procedure: BIOPSY;  Surgeon: Malissa Hippoehman, Najeeb U, MD;  Location: [facility];  Service: Endoscopy;;  FOUR GASTRIC POLYPS BIOPSIED   CARDIOVERSION N/A 03/28/2014   Procedure: CARDIOVERSION;  Surgeon: Pricilla RifflePaula Ross V, MD;  Location: [facility] ORS;  Service: Cardiovascular;  Laterality: N/A;   CARDIOVERSION N/A 10/22/2016   Procedure: CARDIOVERSION;  Surgeon: Quintella Reicherturner, Traci R, MD;  [facility];  Service: Cardiovascular;  Laterality: N/A;   CARDIOVERSION N/A 11/26/2020   Procedure: CARDIOVERSION;  Surgeon: Pricilla Riffleoss, Paula V, MD;  [facility];  Service: Cardiovascular;  Laterality: N/A;   CATARACT EXTRACTION     COLONOSCOPY N/A 08/04/2012   Procedure: COLONOSCOPY;  Surgeon: Malissa HippoNajeeb U Rehman, MD;  Location:  [facility];  Service: Endoscopy;  Laterality: N/A;  730-rescheduled to 1030 Ann notified pt   ELECTROPHYSIOLOGIC STUDY N/A 09/18/2014   Procedure: Atrial Fibrillation Ablation;  Surgeon: Hillis RangeJames Allred, MD;  [facility];  Service: Cardiovascular;  Laterality: N/A;   ESOPHAGOGASTRODUODENOSCOPY N/A 08/27/2016   Procedure: ESOPHAGOGASTRODUODENOSCOPY (EGD);  Surgeon: Malissa Hippoehman, Najeeb U, MD;  Location: [facility];  Service: Endoscopy;  Laterality: N/A;  1200   implantable loop recorder placement  11/22/2020   Medtronic Reveal Linq  model LNQ 11 (SN [id number]) implantable loop recorder   LASIK     Both eyes   TEE WITHOUT CARDIOVERSION N/A 09/18/2014   Procedure: TRANSESOPHAGEAL ECHOCARDIOGRAM (TEE);  Surgeon: Laurey Moralealton S McLean, MD;  [facility];  Service: Cardiovascular;  Laterality: N/A;   TEE WITHOUT CARDIOVERSION N/A 10/22/2016   Procedure: TRANSESOPHAGEAL ECHOCARDIOGRAM (TEE);  Surgeon: Quintella Reicherturner, Traci R, MD;  [facility];  Service: Cardiovascular;  Laterality: N/A;   TEE WITHOUT CARDIOVERSION N/A 08/24/2017   Procedure: TRANSESOPHAGEAL ECHOCARDIOGRAM (TEE);  Surgeon: Wendall StadeNishan, Peter C, MD;  [facility];  Service: Cardiovascular;  Laterality: N/A;   TOTAL HIP ARTHROPLASTY  03/30/2008   Right    Current Medications: Current Meds  Medication Sig   antiseptic oral rinse (BIOTENE) LIQD 15 mLs by Mouth Rinse route daily as needed for dry mouth.   atorvastatin (LIPITOR) 10 MG tablet Take 10 mg by mouth daily at 6 PM.   azelastine (ASTELIN) 0.1 % nasal spray Place 2 sprays into both nostrils 2 (two) times daily. Use in each nostril as directed (Patient taking differently: Place 2 sprays into both nostrils daily. Use in each nostril as directed)   Bacillus Coagulans-Inulin (PROBIOTIC-PREBIOTIC PO) Take 1 capsule by mouth daily.   benzonatate (TESSALON) 200 MG capsule Take 1 capsule (200 mg total) by mouth 3 (three) times daily as needed for cough.   Biotin 1 MG CAPS Take 1,000 mg by mouth 3 (three) times a week.    budesonide (PULMICORT) 0.5 MG/2ML nebulizer solution Take 0.5 mg by nebulization daily.   Calcium Carbonate-Vit D-Min (QC CALCIUM-MAGNESIUM-ZINC-D3) 333.4-133 MG-UNIT TABS Take 1 tablet by mouth 2 (two) times daily.    cetirizine (ZYRTEC) 10 MG tablet Take 10 mg by mouth at bedtime as needed for allergies.   clindamycin (CLEOCIN) 300 MG capsule Take 600 mg by mouth See admin instructions. Take prior to dental procedures   diltiazem (CARDIZEM CD) 120 MG 24 hr capsule Take 1 capsule  (120 mg total) by mouth at bedtime.   diltiazem (CARDIZEM CD) 240 MG 24 hr capsule TAKE ONE CAPSULE BY MOUTH EVERY MORNING   diltiazem (CARDIZEM) 30 MG tablet TAKE 1 TABLET BY MOUTH AS NEEDED FOR PALPITATIONS   dofetilide (TIKOSYN) 500 MCG capsule Take 1 capsule (500 mcg total) by mouth 2 (two) times daily.   EPINEPHrine 0.3 mg/0.3 mL IJ SOAJ injection Inject 0.3 mg into the muscle as needed for anaphylaxis.   famotidine (PEPCID) 20 MG tablet Take 2 tablets (40 mg total) by mouth at bedtime as needed.   fluticasone (FLONASE) 50 MCG/ACT nasal spray Place 1 spray into both nostrils 2 (two) times daily.   hydrALAZINE (APRESOLINE) 100 MG tablet Take 100 mg by mouth 2 (two) times daily.   HYDROcodone bit-homatropine (HYCODAN) 5-1.5 MG/5ML syrup Take 5 mLs by mouth every 6 (six) hours as needed for cough.   Hypertonic Nasal Wash (SINUS RINSE) PACK Place 1 each into the nose daily as needed (clear nasal passage). Neilmed sinus  rinse   ipratropium (ATROVENT) 0.03 % nasal spray INSTILL TWO SPRAYS INTO NOSTRILS TWICE DAILY   ipratropium-albuterol (DUONEB) 0.5-2.5 (3) MG/3ML SOLN Take 3 mLs by nebulization daily.   losartan (COZAAR) 100 MG tablet TAKE 1 TABLET BY MOUTH DAILY   Magnesium 250 MG TABS Take 250 mg by mouth daily.   montelukast (SINGULAIR) 10 MG tablet Take 10 mg by mouth at bedtime.   omeprazole (PRILOSEC) 40 MG capsule Take 1 capsule (40 mg total) by mouth 2 (two) times daily.   Polyethyl Glyc-Propyl Glyc PF (SYSTANE ULTRA PF) 0.4-0.3 % SOLN Place 1 drop into both eyes as needed (dry eyes).   Polyethyl Glycol-Propyl Glycol (SYSTANE) 0.4-0.3 % GEL ophthalmic gel Place 1 Application into both eyes as needed (dry eyes).   potassium chloride SA (KLOR-CON M) 20 MEQ tablet TAKE 1 TABLET BY MOUTH DAILY   rivaroxaban (XARELTO) 20 MG TABS tablet Take 1 tablet (20 mg total) by mouth daily with supper.   umeclidinium-vilanterol (ANORO ELLIPTA) 62.5-25 MCG/ACT AEPB INHALE ONE PUFF INTO THE LUNGS DAILY    Wheat Dextrin (BENEFIBER ON THE GO) POWD Take 10 mLs by mouth daily.   White Petrolatum-Mineral Oil (SYSTANE NIGHTTIME) OINT Place 1 drop into both eyes at bedtime.   Current Facility-Administered Medications for the 07/03/22 encounter (Office Visit) with Keyontae Huckeby, Roberts Gaudy, MD  Medication   methylPREDNISolone acetate (DEPO-MEDROL) injection 80 mg     Allergies:   Bupropion hcl, Escitalopram oxalate, Fluticasone-salmeterol, Lisinopril, and Serevent   Social History   Socioeconomic History   Marital status: Single    Spouse name: Not on file   Number of children: Not on file   Years of education: Not on file   Highest education level: Not on file  Occupational History   Occupation: Retired: Engineer, site  Tobacco Use   Smoking status: Never    Passive exposure: Past   Smokeless tobacco: Never  Vaping Use   Vaping Use: Never used  Substance and Sexual Activity   Alcohol use: No    Alcohol/week: 0.0 standard drinks of alcohol   Drug use: No   Sexual activity: Not on file  Other Topics Concern   Not on file  Social History Narrative   Pt lives in [place] alone. She was never married.   Retired Psychologist, occupational.   Attends [employer]   Social Determinants of Health   Financial Resource Strain: Not on [employer] Insecurity: Not on file  Transportation Needs: Not on file  Physical Activity: Not on file  Stress: Not on file  Social Connections: Not on file     Family History: The patient's family history includes Cancer in her mother; Colon cancer in an other family member; Heart attack in her father.  ROS:   Please see the history of present illness.    All other systems reviewed and are negative.  EKGs/Labs/Other Studies Reviewed:     EKG:  Last EKG results: today - sinus bradycardia. Low atrial amplitude   Recent Labs: 01/12/2022: Pro B Natriuretic peptide (BNP) 114.0 03/09/2022: Hemoglobin 10.9; Platelets 247 04/15/2022: BUN 18; Creatinine, Ser  1.19; Magnesium 2.2; Potassium 4.0; Sodium 136    Risk Assessment/Calculations:    CHA2DS2-VASc Score =     This indicates a  % annual risk of stroke. The patient's score is based upon:             Physical Exam:    VS:  Pulse (!) 54   Ht 5\' 1"  (  1.549 m)   Wt 195 lb 3.2 oz (88.5 kg)   SpO2 95%   BMI 36.88 kg/m     Wt Readings from Last 3 Encounters:  07/03/22 195 lb 3.2 oz (88.5 kg)  06/01/22 195 lb 4.8 oz (88.6 kg)  05/18/22 195 lb 3.2 oz (88.5 kg)     GEN: Well nourished, well developed in no acute distress CARDIAC: irregular, no murmurs, rubs, gallops RESPIRATORY:  Normal work of breathing MUSCULOSKELETAL: trace edema    ASSESSMENT & PLAN:    Persistent atrial fibrillation: s/p repeat (3rd) ablation 03/18/2022. There were not many targets for ablation at that time. I do not think she would benefit from another ablation.       - still having AF with a burden of about 35%       - continue Tikosyn. Labs 04/15/2022 reviewed, QTC is ok today, 461 msec Atypical atrial flutter: documented on 10/24 ECG. Likely involving the posterior LA.  Secondary hypercoagulable state: continue anticoagulation for CHADS2Vasc of 3       Medication Adjustments/Labs and Tests Ordered: Current medicines are reviewed at length with the patient today.  Concerns regarding medicines are outlined above.  Orders Placed This Encounter  Procedures   EKG 12-Lead   No orders of the defined types were placed in this encounter.    Signed, Maurice Small, MD  07/03/2022 1:10 PM    [facility]

## 2022-07-03 NOTE — Patient Instructions (Signed)
Medication Instructions:  Continue all current medications.  Labwork: none  Testing/Procedures: none  Follow-Up: 1 year - Dr.  Mealor  6 months - afib clinic    Any Other Special Instructions Will Be Listed Below (If Applicable).   If you need a refill on your cardiac medications before your next appointment, please call your pharmacy.  

## 2022-07-06 ENCOUNTER — Ambulatory Visit (INDEPENDENT_AMBULATORY_CARE_PROVIDER_SITE_OTHER): Payer: Medicare Other

## 2022-07-06 ENCOUNTER — Other Ambulatory Visit: Payer: Self-pay

## 2022-07-06 DIAGNOSIS — M25571 Pain in right ankle and joints of right foot: Secondary | ICD-10-CM | POA: Diagnosis not present

## 2022-07-06 DIAGNOSIS — M62830 Muscle spasm of back: Secondary | ICD-10-CM | POA: Diagnosis not present

## 2022-07-06 DIAGNOSIS — G4733 Obstructive sleep apnea (adult) (pediatric): Secondary | ICD-10-CM

## 2022-07-06 DIAGNOSIS — M9901 Segmental and somatic dysfunction of cervical region: Secondary | ICD-10-CM | POA: Diagnosis not present

## 2022-07-06 DIAGNOSIS — M9903 Segmental and somatic dysfunction of lumbar region: Secondary | ICD-10-CM | POA: Diagnosis not present

## 2022-07-06 DIAGNOSIS — M25572 Pain in left ankle and joints of left foot: Secondary | ICD-10-CM | POA: Diagnosis not present

## 2022-07-06 DIAGNOSIS — M546 Pain in thoracic spine: Secondary | ICD-10-CM | POA: Diagnosis not present

## 2022-07-06 DIAGNOSIS — M9902 Segmental and somatic dysfunction of thoracic region: Secondary | ICD-10-CM | POA: Diagnosis not present

## 2022-07-06 DIAGNOSIS — I480 Paroxysmal atrial fibrillation: Secondary | ICD-10-CM

## 2022-07-06 MED ORDER — UMECLIDINIUM-VILANTEROL 62.5-25 MCG/ACT IN AEPB: INHALATION_SPRAY | RESPIRATORY_TRACT | 11 refills | Status: DC

## 2022-07-06 MED ORDER — UMECLIDINIUM-VILANTEROL 62.5-25 MCG/ACT IN AEPB: INHALATION_SPRAY | RESPIRATORY_TRACT | 3 refills | Status: DC

## 2022-07-06 NOTE — Telephone Encounter (Signed)
Dr. Maple Hudson can I place order for patient to receive new cpap machine. Cpap is no longer working. [employer] advises she can get new machine

## 2022-07-06 NOTE — Telephone Encounter (Signed)
Order for new cpap machine has been placed. Patient is aware

## 2022-07-06 NOTE — Telephone Encounter (Signed)
Script and paperwork from patient for [employer] going out today. Patient is aware. Will contact [employer] in a day or two to see if they have received

## 2022-07-06 NOTE — Telephone Encounter (Signed)
Patient came into the office to drop off [employer] application- given directly to Idalia Needle- will follow up with Idalia Needle and the patient once paperwork is faxed to [employer].   Also, patient is having problem with her CPAP machine, [facility] told her she is able to get a new machine, but needs the order sent to [employer]. Please advise on sending order.

## 2022-07-06 NOTE — Telephone Encounter (Signed)
Order- DME [facility]- please replace old CPAP machine, change to auto 5-15, mask of choice, humidifier, supplies, AirView/ card 

## 2022-07-06 NOTE — Telephone Encounter (Signed)
Paperwork has been sent to [employer]. Will call over the next couple of days to verify they have received. Leaving encounter open for now

## 2022-07-07 LAB — CUP PACEART REMOTE DEVICE CHECK
Date Time Interrogation Session: 20240407232017
Implantable Pulse Generator Implant Date: 20220826

## 2022-07-07 NOTE — Progress Notes (Signed)
Carelink Summary Report / Loop Recorder 

## 2022-07-08 NOTE — Telephone Encounter (Signed)
Spoke with [employer], income papers were cut off and they need medicare part A & B cards faxed. Idalia Needle is aware and will refax paperwork.   Also spoke with [employer]- confirmed order is in and being processed. Spoke with patient to relay all this information.

## 2022-07-08 NOTE — Progress Notes (Unsigned)
    Cardiology Office Note  Date: 07/09/2022   ID: [name] [name], DOB 07-18-1940, MRN [id number]  History of Present Illness: [name] [name] is an 82 y.o. female seen recently by Dr. Nelly Laurence in April, I reviewed the note.  I last saw her in October 2023.  Interval chart reviewed, she underwent her third atrial fibrillation ablation attempt in December 2023.  She is here for a routine visit.  States that overall she has been doing better, even with breakthrough atrial fibrillation. Recent ILR interrogation in early April showed AF burden 29.5%.  She continues with rehabilitation, tolerating exercise plan although she has had some hip pain.  No dizziness or syncope.  No exertional chest pain.  I reviewed her ECG today, shows sinus rhythm with frequent PACs, left anterior fascicular block, decreased R wave progression.  We went over her medications, unchanged from a cardiac perspective.  She reports no spontaneous bleeding problems on Xarelto.  Has not had to use any short acting Cardizem.  Physical Exam: VS:  BP (!) 136/56   Pulse (!) 58   Ht 5' 1.5" (1.562 m)   Wt 198 lb (89.8 kg)   SpO2 99%   BMI 36.81 kg/m , BMI Body mass index is 36.81 kg/m.  Wt Readings from Last 3 Encounters:  07/09/22 198 lb (89.8 kg)  07/03/22 195 lb 3.2 oz (88.5 kg)  06/01/22 195 lb 4.8 oz (88.6 kg)    General: Patient appears comfortable at rest. HEENT: Conjunctiva and lids normal. Lungs: Clear to auscultation, nonlabored breathing at rest. Cardiac: RRR with ectopy, 2/6 stock murmur without gallop.. Extremities: No pitting edema.  ECG:  An ECG dated 07/03/2022 was personally reviewed today and demonstrated:  Sinus bradycardia with LVH.  Labwork: April 2023: Cholesterol 114, triglycerides 113, HDL 40, LDL 53 01/12/2022: Pro B Natriuretic peptide (BNP) 114.0 03/09/2022: Hemoglobin 10.9; Platelets 247 04/15/2022: BUN 18; Creatinine, Ser 1.19; Magnesium 2.2; Potassium 4.0; Sodium 136   Other Studies  Reviewed Today:  No interval cardiac testing for review today.  Assessment and Plan:  1.  Persistent atrial fibrillation as well as atypical atrial flutter.  She underwent third atrial fibrillation ablation attempt in December 2023 with Dr. Nelly Laurence and plan is to continue with medical therapy including Tikosyn, Cardizem CD and Xarelto.  CHA2DS2-VASc score is at least 5.  She does not report any spontaneous bleeding problems.  I reviewed her lab work from January.  She is in sinus rhythm today with frequent PACs.  No changes were made in regimen.  Continue with exercise plan.  2.  OSA on CPAP.  3.  Essential hypertension.  Currently on losartan and hydralazine in addition to the above regimen.  No changes were made.  Disposition:  Follow up  6 months.  Signed, [name], M.D., F.A.C.C. [facility] at [facility]

## 2022-07-09 ENCOUNTER — Encounter (INDEPENDENT_AMBULATORY_CARE_PROVIDER_SITE_OTHER): Payer: Medicare Other | Admitting: Cardiology

## 2022-07-09 ENCOUNTER — Encounter: Payer: Self-pay | Admitting: Cardiology

## 2022-07-09 VITALS — BP 136/56 | HR 58 | Ht 61.5 in | Wt 198.0 lb

## 2022-07-09 DIAGNOSIS — I4819 Other persistent atrial fibrillation: Secondary | ICD-10-CM | POA: Diagnosis not present

## 2022-07-09 DIAGNOSIS — G4733 Obstructive sleep apnea (adult) (pediatric): Secondary | ICD-10-CM

## 2022-07-09 DIAGNOSIS — I484 Atypical atrial flutter: Secondary | ICD-10-CM | POA: Diagnosis not present

## 2022-07-09 DIAGNOSIS — I10 Essential (primary) hypertension: Secondary | ICD-10-CM

## 2022-07-09 DIAGNOSIS — M25551 Pain in right hip: Secondary | ICD-10-CM | POA: Diagnosis not present

## 2022-07-09 DIAGNOSIS — J4520 Mild intermittent asthma, uncomplicated: Secondary | ICD-10-CM | POA: Diagnosis not present

## 2022-07-09 DIAGNOSIS — I444 Left anterior fascicular block: Secondary | ICD-10-CM | POA: Diagnosis not present

## 2022-07-09 DIAGNOSIS — Z299 Encounter for prophylactic measures, unspecified: Secondary | ICD-10-CM | POA: Diagnosis not present

## 2022-07-09 MED ORDER — DILTIAZEM HCL ER COATED BEADS 240 MG PO CP24: 240.0000 mg | ORAL_CAPSULE | Freq: Every morning | ORAL | 3 refills | Status: DC

## 2022-07-09 NOTE — Telephone Encounter (Signed)
Called [employer] they confirmed receiving patient assistance paperwork,. Advised it would take two days to process then 7-10 days until patient receives medication. I have called and updated patient. Closing encounter for now NFN

## 2022-07-09 NOTE — Patient Instructions (Addendum)

## 2022-07-13 ENCOUNTER — Telehealth: Payer: Self-pay | Admitting: Cardiology

## 2022-07-13 NOTE — Telephone Encounter (Signed)
Patient with diagnosis of afib on Xarelto for anticoagulation.    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 The patient's score is based upon: CHF History: 0 HTN History: 1 Diabetes History: 0 Stroke History: 0 Vascular Disease History: 1 Age Score: 2 Gender Score: 1      CrCl 37 ml/min  Per office protocol, patient can hold Xarelto for 2 days prior to procedure.    **This guidance is not considered finalized until pre-operative APP has relayed final recommendations.**

## 2022-07-13 NOTE — Telephone Encounter (Signed)
   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 Melida Quitter  Surgeon's Group or Practice Name:  [facility] Phone number:  [phone] Fax number:  [phone]   Type of Clearance Requested:   - Medical  - Pharmacy:  Hold Rivaroxaban (Xarelto)     Type of Anesthesia:  Local    Additional requests/questions:      SignedFilomena Jungling   07/13/2022, 1:03 PM

## 2022-07-14 NOTE — Telephone Encounter (Signed)
   Patient Name: [name] [name]  DOB: Feb 02, 1941 MRN: [id number]  Primary Cardiologist: Nona Dell, MD  Chart reviewed as part of pre-operative protocol coverage. Pre-op clearance already addressed by colleagues in earlier phone notes. To summarize recommendations:  -Removal of a skin lesion under local anesthesia does not require any type of cardiac clearance.  The main question is handling anticoagulation which has already been addressed. -Dr. Diona Browner  Per office protocol, patient can hold Xarelto for 2 days prior to procedure.   Will route this bundled recommendation to requesting provider via Epic fax function and remove from pre-op pool. Please call with questions.  Sharlene Dory, PA-C 07/14/2022, 7:24 AM

## 2022-07-15 DIAGNOSIS — C44722 Squamous cell carcinoma of skin of right lower limb, including hip: Secondary | ICD-10-CM | POA: Diagnosis not present

## 2022-07-15 DIAGNOSIS — L905 Scar conditions and fibrosis of skin: Secondary | ICD-10-CM | POA: Diagnosis not present

## 2022-07-17 ENCOUNTER — Encounter (INDEPENDENT_AMBULATORY_CARE_PROVIDER_SITE_OTHER): Payer: Self-pay | Admitting: *Deleted

## 2022-07-20 ENCOUNTER — Ambulatory Visit (INDEPENDENT_AMBULATORY_CARE_PROVIDER_SITE_OTHER): Payer: Medicare Other | Admitting: Podiatry

## 2022-07-20 ENCOUNTER — Encounter: Payer: Self-pay | Admitting: Podiatry

## 2022-07-20 VITALS — BP 139/52

## 2022-07-20 DIAGNOSIS — M79675 Pain in left toe(s): Secondary | ICD-10-CM

## 2022-07-20 DIAGNOSIS — M79674 Pain in right toe(s): Secondary | ICD-10-CM

## 2022-07-20 DIAGNOSIS — B351 Tinea unguium: Secondary | ICD-10-CM | POA: Diagnosis not present

## 2022-07-20 NOTE — Progress Notes (Signed)
  Subjective:  Patient ID: [name] [name], female    DOB: 02/29/1941,  MRN: [id number]  [name] [name] presents to clinic today for painful elongated mycotic toenails 1-5 bilaterally which are tender when wearing enclosed shoe gear. Pain is relieved with periodic professional debridement.  Chief Complaint  Patient presents with   Nail Problem    RFC PCP-Shah PCP VST-3 weeks   She states she has been having more foot pain lately. She has been wearing New Balance sneakers and her orthotics for her treadmill exercises. Her sneakers are about two months old.   PCP is [name] Peri, MD.  Allergies  Allergen Reactions   Bupropion Hcl Other (See Comments)    Suicidal Thoughts.    Escitalopram Oxalate Other (See Comments)    Suicidal Thoughts.    Fluticasone-Salmeterol Other (See Comments)    Advair - Caused patient to go into Afib.    Lisinopril Cough   Serevent Other (See Comments)    Caused patient to go into Afib    Review of Systems: Negative except as noted in the HPI.  Objective: No changes noted in today's physical examination. Vitals:   07/20/22 0907  BP: (!) 139/52   [name] [name] is a pleasant 82 y.o. female WD, WN in NAD. AAO x 3.  Vascular CFT immediate b/l LE. Palpable DP/PT pulses b/l LE. Digital hair sparse b/l. Skin temperature gradient WNL b/l. No pain with calf compression b/l. Trace edema b/l LE. No cyanosis or clubbing noted b/l LE.  Neurologic Normal speech. Oriented to person, place, and time. Pt has subjective symptoms of neuropathy. Protective sensation intact 5/5 intact bilaterally with 10g monofilament b/l.  Dermatologic Pedal integument with normal turgor, texture and tone b/l LE. No open wounds b/l. No interdigital macerations b/l. Toenails 1-5 b/l elongated, thickened, discolored with subungual debris. +Tenderness with dorsal palpation of nailplates.   Resolved hyperkeratotic lesion medial aspect right 2nd digit PIPJ due to rubbing of right  hallux.  Orthopedic: Normal muscle strength 5/5 to all lower extremity muscle groups bilaterally. 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 Pes planus deformity noted bilateral LE. ROM limited due to fusion of left 1st MPJ.  Patient ambulates with cane assistance.   Assessment/Plan: 1. Pain due to onychomycosis of toenails of both feet    -Consent given for treatment as described below: -Examined patient. -Patient with increasing foot pain bilaterally to schedule appointment with Dr. Ardelle Anton for re-evaluation. -Patient to continue soft, supportive shoe gear daily. -Toenails 1-5 b/l were debrided in length and girth with sterile nail nippers and dremel without iatrogenic bleeding.  -Patient/POA to call should there be question/concern in the interim.   Return in about 9 weeks (around 09/21/2022).  Freddie Breech, DPM

## 2022-07-24 DIAGNOSIS — M9901 Segmental and somatic dysfunction of cervical region: Secondary | ICD-10-CM | POA: Diagnosis not present

## 2022-07-24 DIAGNOSIS — M62830 Muscle spasm of back: Secondary | ICD-10-CM | POA: Diagnosis not present

## 2022-07-24 DIAGNOSIS — M546 Pain in thoracic spine: Secondary | ICD-10-CM | POA: Diagnosis not present

## 2022-07-24 DIAGNOSIS — M9902 Segmental and somatic dysfunction of thoracic region: Secondary | ICD-10-CM | POA: Diagnosis not present

## 2022-07-24 DIAGNOSIS — M25571 Pain in right ankle and joints of right foot: Secondary | ICD-10-CM | POA: Diagnosis not present

## 2022-07-24 DIAGNOSIS — L728 Other follicular cysts of the skin and subcutaneous tissue: Secondary | ICD-10-CM | POA: Diagnosis not present

## 2022-07-24 DIAGNOSIS — M9903 Segmental and somatic dysfunction of lumbar region: Secondary | ICD-10-CM | POA: Diagnosis not present

## 2022-07-24 DIAGNOSIS — Z4802 Encounter for removal of sutures: Secondary | ICD-10-CM | POA: Diagnosis not present

## 2022-07-24 DIAGNOSIS — M25572 Pain in left ankle and joints of left foot: Secondary | ICD-10-CM | POA: Diagnosis not present

## 2022-07-28 DIAGNOSIS — I10 Essential (primary) hypertension: Secondary | ICD-10-CM | POA: Diagnosis not present

## 2022-07-29 ENCOUNTER — Ambulatory Visit: Payer: Medicare Other

## 2022-07-30 ENCOUNTER — Encounter (HOSPITAL_COMMUNITY): Admission: RE | Admit: 2022-07-30 | Payer: Medicare Other | Source: Ambulatory Visit

## 2022-07-31 ENCOUNTER — Encounter (HOSPITAL_COMMUNITY)
Admission: RE | Admit: 2022-07-31 | Discharge: 2022-07-31 | Disposition: A | Discharge status: Home / Self Care | Payer: Medicare Other | Source: Ambulatory Visit | Attending: Internal Medicine | Admitting: Internal Medicine

## 2022-07-31 VITALS — BP 128/62 | HR 58 | Temp 98.2°F | Resp 18

## 2022-07-31 DIAGNOSIS — J4520 Mild intermittent asthma, uncomplicated: Secondary | ICD-10-CM | POA: Insufficient documentation

## 2022-07-31 MED ORDER — MEPOLIZUMAB 100 MG ~~LOC~~ SOLR
100.0000 mg | Freq: Once | SUBCUTANEOUS | Status: AC
  Administered 2022-07-31: 100 mg via SUBCUTANEOUS
  Filled 2022-07-31: qty 1

## 2022-07-31 NOTE — Progress Notes (Signed)
Diagnosis: Asthma  Provider:   Dr. Jetty Duhamel  Procedure: Injection  Nucala (Mepolizumab), Dose: 100 mg, Site: subcutaneous, Number of injections: 1  Post Care: Observation period completed  Discharge: Condition: Good, Destination: Home . AVS Provided and AVS Declined  Performed by:  Marin Shutter, RN

## 2022-08-03 ENCOUNTER — Other Ambulatory Visit (HOSPITAL_COMMUNITY): Payer: Self-pay | Admitting: Internal Medicine

## 2022-08-03 DIAGNOSIS — Z1231 Encounter for screening mammogram for malignant neoplasm of breast: Secondary | ICD-10-CM

## 2022-08-03 DIAGNOSIS — M9903 Segmental and somatic dysfunction of lumbar region: Secondary | ICD-10-CM | POA: Diagnosis not present

## 2022-08-03 DIAGNOSIS — Z299 Encounter for prophylactic measures, unspecified: Secondary | ICD-10-CM | POA: Diagnosis not present

## 2022-08-03 DIAGNOSIS — M62830 Muscle spasm of back: Secondary | ICD-10-CM | POA: Diagnosis not present

## 2022-08-03 DIAGNOSIS — Z1331 Encounter for screening for depression: Secondary | ICD-10-CM | POA: Diagnosis not present

## 2022-08-03 DIAGNOSIS — M546 Pain in thoracic spine: Secondary | ICD-10-CM | POA: Diagnosis not present

## 2022-08-03 DIAGNOSIS — Z0000 Encounter for general adult medical examination without abnormal findings: Secondary | ICD-10-CM | POA: Diagnosis not present

## 2022-08-03 DIAGNOSIS — M25571 Pain in right ankle and joints of right foot: Secondary | ICD-10-CM | POA: Diagnosis not present

## 2022-08-03 DIAGNOSIS — Z1339 Encounter for screening examination for other mental health and behavioral disorders: Secondary | ICD-10-CM | POA: Diagnosis not present

## 2022-08-03 DIAGNOSIS — E559 Vitamin D deficiency, unspecified: Secondary | ICD-10-CM | POA: Diagnosis not present

## 2022-08-03 DIAGNOSIS — M9901 Segmental and somatic dysfunction of cervical region: Secondary | ICD-10-CM | POA: Diagnosis not present

## 2022-08-03 DIAGNOSIS — E7800 Pure hypercholesterolemia, unspecified: Secondary | ICD-10-CM | POA: Diagnosis not present

## 2022-08-03 DIAGNOSIS — M9902 Segmental and somatic dysfunction of thoracic region: Secondary | ICD-10-CM | POA: Diagnosis not present

## 2022-08-03 DIAGNOSIS — Z7189 Other specified counseling: Secondary | ICD-10-CM | POA: Diagnosis not present

## 2022-08-03 DIAGNOSIS — M25572 Pain in left ankle and joints of left foot: Secondary | ICD-10-CM | POA: Diagnosis not present

## 2022-08-03 DIAGNOSIS — E1165 Type 2 diabetes mellitus with hyperglycemia: Secondary | ICD-10-CM | POA: Diagnosis not present

## 2022-08-03 DIAGNOSIS — I10 Essential (primary) hypertension: Secondary | ICD-10-CM | POA: Diagnosis not present

## 2022-08-03 DIAGNOSIS — R5383 Other fatigue: Secondary | ICD-10-CM | POA: Diagnosis not present

## 2022-08-03 DIAGNOSIS — Z79899 Other long term (current) drug therapy: Secondary | ICD-10-CM | POA: Diagnosis not present

## 2022-08-06 DIAGNOSIS — Z299 Encounter for prophylactic measures, unspecified: Secondary | ICD-10-CM | POA: Diagnosis not present

## 2022-08-06 DIAGNOSIS — M9901 Segmental and somatic dysfunction of cervical region: Secondary | ICD-10-CM | POA: Diagnosis not present

## 2022-08-06 DIAGNOSIS — E611 Iron deficiency: Secondary | ICD-10-CM | POA: Diagnosis not present

## 2022-08-06 DIAGNOSIS — I10 Essential (primary) hypertension: Secondary | ICD-10-CM | POA: Diagnosis not present

## 2022-08-06 DIAGNOSIS — M546 Pain in thoracic spine: Secondary | ICD-10-CM | POA: Diagnosis not present

## 2022-08-06 DIAGNOSIS — M9902 Segmental and somatic dysfunction of thoracic region: Secondary | ICD-10-CM | POA: Diagnosis not present

## 2022-08-06 DIAGNOSIS — M9903 Segmental and somatic dysfunction of lumbar region: Secondary | ICD-10-CM | POA: Diagnosis not present

## 2022-08-06 DIAGNOSIS — M25551 Pain in right hip: Secondary | ICD-10-CM | POA: Diagnosis not present

## 2022-08-06 DIAGNOSIS — M25571 Pain in right ankle and joints of right foot: Secondary | ICD-10-CM | POA: Diagnosis not present

## 2022-08-06 DIAGNOSIS — M62830 Muscle spasm of back: Secondary | ICD-10-CM | POA: Diagnosis not present

## 2022-08-06 DIAGNOSIS — R5383 Other fatigue: Secondary | ICD-10-CM | POA: Diagnosis not present

## 2022-08-06 DIAGNOSIS — M25572 Pain in left ankle and joints of left foot: Secondary | ICD-10-CM | POA: Diagnosis not present

## 2022-08-10 ENCOUNTER — Ambulatory Visit (INDEPENDENT_AMBULATORY_CARE_PROVIDER_SITE_OTHER): Payer: Medicare Other | Admitting: Podiatry

## 2022-08-10 ENCOUNTER — Ambulatory Visit (INDEPENDENT_AMBULATORY_CARE_PROVIDER_SITE_OTHER): Payer: Medicare Other

## 2022-08-10 ENCOUNTER — Encounter: Payer: Self-pay | Admitting: Podiatry

## 2022-08-10 DIAGNOSIS — M722 Plantar fascial fibromatosis: Secondary | ICD-10-CM | POA: Diagnosis not present

## 2022-08-10 DIAGNOSIS — R2681 Unsteadiness on feet: Secondary | ICD-10-CM | POA: Diagnosis not present

## 2022-08-10 DIAGNOSIS — I4819 Other persistent atrial fibrillation: Secondary | ICD-10-CM

## 2022-08-10 LAB — CUP PACEART REMOTE DEVICE CHECK
Date Time Interrogation Session: 20240510230628
Implantable Pulse Generator Implant Date: 20220826

## 2022-08-10 NOTE — Progress Notes (Signed)
Carelink Summary Report / Loop Recorder 

## 2022-08-10 NOTE — Progress Notes (Signed)
Subjective: Chief Complaint  Patient presents with   Foot Orthotics    Patient states she is having trouble in PT-chiropractor thinks its the orthotics   82 year old female presents the office with above concerns.  She is without her joint pains and she thinks is coming from the orthotics.  She has had other issues of the joints previously but it seems that the orthotics need exacerbated the symptoms.  She has tried different modifications with the inserts that she has received from [place].  She said the first.  That she has gives more support from the second.  It seems at the arch there is fallen after couple months.  Initially they were more comfortable.  She states that overall she does feel better than when she started however she is still concerned about this.  Objective: AAO x3, NAD DP/PT pulses palpable bilaterally, CRT less than 3 seconds Not able to elicit any area pinpoint tenderness bilaterally.  There is chronic appearing edema but there is no erythema or warmth.  Flexor, sensory tendons are intact.  MMT 5/5. No pain with calf compression, swelling, warmth, erythema  Assessment: Gait instability  Plan: -All treatment options discussed with the patient including all alternatives, risks, complications.  -We discussed the nurse orthotics.  The first pair that she received.  They have better support but she has not been wearing them.  The pair that she has been wearing it does seem that the arch is less than the first pair and not providing the support that she had originally.  I want her to go back to wearing for prescription orthotics and see if that is helpful and if so we will increase the arch or changes of the material on the orthotics to get better arch support.  If the orthotics are still causing pain we might need to switch to more of a softer, accommodative orthotic.  She is going to try this over the next week or 2 and let me know how she is doing and send me a MyChart message or  call me.  If she needs any adjustments or she does not bring them to me. -Patient encouraged to call the office with any questions, concerns, change in symptoms.   [name] DPM

## 2022-08-12 DIAGNOSIS — M25551 Pain in right hip: Secondary | ICD-10-CM | POA: Diagnosis not present

## 2022-08-13 ENCOUNTER — Ambulatory Visit (HOSPITAL_COMMUNITY)
Admission: RE | Admit: 2022-08-13 | Discharge: 2022-08-13 | Disposition: A | Discharge status: Home / Self Care | Payer: Medicare Other | Source: Ambulatory Visit | Attending: Internal Medicine | Admitting: Internal Medicine

## 2022-08-13 DIAGNOSIS — Z1231 Encounter for screening mammogram for malignant neoplasm of breast: Secondary | ICD-10-CM | POA: Diagnosis not present

## 2022-08-14 DIAGNOSIS — M25551 Pain in right hip: Secondary | ICD-10-CM | POA: Diagnosis not present

## 2022-08-16 NOTE — Progress Notes (Unsigned)
HPI female never smoker followed for OSA, allergic rhinitis, asthma, Hyper IgE,  complicated by A. Fib/Coumadin, HBP, GERD, obesity NPSG prior to EMR in 2012 Office Spirometry 09/02/16-WNL. FVC 2.06/76%, FEV1 1.57/77%, ratio 0.76, FEF 25-75% 1.26/80% Allergy labs 09/02/16- eosinophils 200, total IgE 4230 causing elevation of all specific allergen antibody levels including Aspergillus. Nucala  + 2018 PFT-10/13/16-minimal obstruction without response to dilator, minimal reduction of diffusion. FVC 2.02/77%, FEV1 1.62/83%, ratio 0.80, TLC 92%, DLCO 77% CT soft tissue neck 09/22/16-1. 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  Subsegmental atelectasis and nonspecific linear patchy densities in the lungs as described. They have a benign appearance.   Small hyperdense masses in the mediastinum are stable compared with 2015 supporting benign etiology such as ectopic thyroid tissue.  Three-vessel prominent coronary artery calcification.  Bibasilar bronchiolectasis. Labs 09/20/2017-WBC 14,900, hemoglobin 11.2, eosinophils absent, lymphocytes low( ?steroids or viral?), BNP 220, CMET wnl IgE 04/12/2018- 2,574, 10/16/16- 4,302, 11/09/13- 3,513 Office Spirometry 04/12/2018-mild restriction of exhaled volume.  Possible mild obstructive airways.  FVC 1.9/73%, FEV1 1.4/75%, ratio 1.76, FEF 25-75% 1.2/80%  -------------------------------------------------------------------------------------------    05/18/22- 82 year old female never smoker followed for OSA, allergic Rhinitis, Asthma/ Nucala, Hyper IgE, complicated by PAFibXarelto, HBP, GERD, obesity, Anemia,  -Neb Pulmicort/  Duoneb   Anoro, Flonase, Zyrtec, Singulair,  CPAP 9/Adapt      AirSense 10 AutoSet Download- compliance 100%, AHI 3/ hr Body weight today- 195 lbs Covid vax- 5 Moderna Flu vax-had RSV vax- had Cardiac ablation 03/18/22 Increased postnasal drip in the last few weeks.  She is doing  daily saline sinus rinse, using Astelin nasal spray twice daily and Flonase. Morning cough for few hours each day then clears.  Aware of some reflux on double acid blocker therapy.  Continuing maintenance pulmonary rehabilitation at [facility].  She cannot tell when her cough is affected by her heart, reflux or lung issues.  Continues Anoro. Download reviewed.  08/18/22-  82 year old female never smoker followed for OSA, allergic Rhinitis, Asthma/ Nucala, Hyper IgE, complicated by PAFibXarelto/ Pacemaker, HBP, GERD, obesity, Anemia,  -Neb Pulmicort/  Duoneb   Anoro, Flonase, Zyrtec, Singulair, Nucala,  CPAP 9/Adapt      AirSense 10 AutoSet Download- compliance  Body weight today-     ROS-see HPI   + = positive Constitutional:   No-   weight loss, night sweats, fevers, chills, fatigue, lassitude. HEENT:   No-  headaches, difficulty swallowing, tooth/dental problems, sore throat,       No-  sneezing, itching,  ear ache,  +nasal congestion, post nasal drip,  CV:  No-   chest pain, orthopnea, PND, swelling in lower extremities, anasarca, dizziness, palpitations Resp: +  shortness of breath with exertion or at rest.                +productive cough,  + non-productive cough,  No- coughing up of blood.              No-change in color of mucus. + wheezing.   Skin: No-   rash or lesions. GI:  No-   heartburn, indigestion, abdominal pain, nausea, vomiting, GU:  MS:  No-   joint pain or swelling. . Neuro-     nothing unusual Psych:  No- change in mood or affect. No depression or anxiety.  No memory loss.  OBJ- Physical Exam General- Alert, Oriented, Affect-appropriate, Distress- none acute, + obese Skin- rash-none, lesions- none, excoriation- none Lymphadenopathy- none Head- atraumatic  Eyes- Gross vision intact, PERRLA, conjunctivae and secretions clear            Ears- Hearing, canals-normal            Nose- No turbinate edema, no-Septal dev, mucus, polyps, erosion, perforation              Throat- Mallampati III-IV , mucosa- not red,  drainage- none, tonsils- atrophic Neck- flexible , trachea midline, no stridor , thyroid nl, carotid no bruit Chest - symmetrical excursion , unlabored           Heart/CV- RR today/ hx AFib(no pacemaker) , no murmur , no gallop  , no rub, nl  s1 s2                  - JVD- none , edema- none, stasis changes- none, varices- none           Lung- + clear  Wheeze-none , cough+dry,                         dullness-none, rub- none           Chest wall-  Abd- Br/ Gen/ Rectal- Not done, not indicated Extrem- cyanosis- none, clubbing, none, atrophy- none, strength- nl Neuro- grossly intact to observation

## 2022-08-17 DIAGNOSIS — M25551 Pain in right hip: Secondary | ICD-10-CM | POA: Diagnosis not present

## 2022-08-18 ENCOUNTER — Encounter: Payer: Self-pay | Admitting: Internal Medicine

## 2022-08-18 ENCOUNTER — Ambulatory Visit (INDEPENDENT_AMBULATORY_CARE_PROVIDER_SITE_OTHER): Payer: Medicare Other | Admitting: Internal Medicine

## 2022-08-18 VITALS — BP 126/64 | HR 65 | Ht 61.5 in | Wt 193.0 lb

## 2022-08-18 DIAGNOSIS — J4521 Mild intermittent asthma with (acute) exacerbation: Secondary | ICD-10-CM | POA: Diagnosis not present

## 2022-08-18 DIAGNOSIS — G4733 Obstructive sleep apnea (adult) (pediatric): Secondary | ICD-10-CM | POA: Diagnosis not present

## 2022-08-18 NOTE — Assessment & Plan Note (Signed)
Benefits from CPAP with good compliance and control.  Likes new machine. Plan continue auto 5-15 

## 2022-08-18 NOTE — Patient Instructions (Signed)
We can continue CPAP auto 5-15  Please let us know if we can help

## 2022-08-18 NOTE — Assessment & Plan Note (Signed)
Describing mild bronchitic symptoms on first awakening in the morning.  She clears her chest with a few coughs and then is comfortable rest of the day.  We will keep an eye on this.  Meanwhile continue current meds.

## 2022-08-19 DIAGNOSIS — M25551 Pain in right hip: Secondary | ICD-10-CM | POA: Diagnosis not present

## 2022-08-24 ENCOUNTER — Encounter: Payer: Self-pay | Admitting: Podiatry

## 2022-08-25 DIAGNOSIS — Z299 Encounter for prophylactic measures, unspecified: Secondary | ICD-10-CM | POA: Diagnosis not present

## 2022-08-25 DIAGNOSIS — I10 Essential (primary) hypertension: Secondary | ICD-10-CM | POA: Diagnosis not present

## 2022-08-25 DIAGNOSIS — M25551 Pain in right hip: Secondary | ICD-10-CM | POA: Diagnosis not present

## 2022-08-25 DIAGNOSIS — R0982 Postnasal drip: Secondary | ICD-10-CM | POA: Diagnosis not present

## 2022-08-26 DIAGNOSIS — M9901 Segmental and somatic dysfunction of cervical region: Secondary | ICD-10-CM | POA: Diagnosis not present

## 2022-08-26 DIAGNOSIS — M25551 Pain in right hip: Secondary | ICD-10-CM | POA: Diagnosis not present

## 2022-08-26 DIAGNOSIS — M9902 Segmental and somatic dysfunction of thoracic region: Secondary | ICD-10-CM | POA: Diagnosis not present

## 2022-08-26 DIAGNOSIS — M62830 Muscle spasm of back: Secondary | ICD-10-CM | POA: Diagnosis not present

## 2022-08-26 DIAGNOSIS — M546 Pain in thoracic spine: Secondary | ICD-10-CM | POA: Diagnosis not present

## 2022-08-26 DIAGNOSIS — M9903 Segmental and somatic dysfunction of lumbar region: Secondary | ICD-10-CM | POA: Diagnosis not present

## 2022-08-26 DIAGNOSIS — M25571 Pain in right ankle and joints of right foot: Secondary | ICD-10-CM | POA: Diagnosis not present

## 2022-08-26 DIAGNOSIS — M25572 Pain in left ankle and joints of left foot: Secondary | ICD-10-CM | POA: Diagnosis not present

## 2022-08-27 ENCOUNTER — Encounter (HOSPITAL_COMMUNITY)
Admission: RE | Admit: 2022-08-27 | Discharge: 2022-08-27 | Disposition: A | Discharge status: Home / Self Care | Payer: Medicare Other | Source: Ambulatory Visit | Attending: Internal Medicine | Admitting: Internal Medicine

## 2022-08-27 VITALS — BP 130/66 | HR 65 | Temp 97.5°F | Resp 16

## 2022-08-27 DIAGNOSIS — J4520 Mild intermittent asthma, uncomplicated: Secondary | ICD-10-CM | POA: Diagnosis not present

## 2022-08-27 MED ORDER — MEPOLIZUMAB 100 MG ~~LOC~~ SOLR
100.0000 mg | Freq: Once | SUBCUTANEOUS | Status: AC
  Administered 2022-08-27: 100 mg via SUBCUTANEOUS
  Filled 2022-08-27: qty 1

## 2022-08-27 NOTE — Progress Notes (Signed)
Diagnosis: Asthma  Provider:   Dr  Jetty Duhamel   Procedure: Injection  Nucala (Mepolizumab), Dose: 100 mg, Site: subcutaneous, Number of injections: 1  Left arm  Post Care: Patient declined observation  Discharge: Condition: Good, Destination: Home . AVS Provided  Performed by:  Marlow Baars [place], RN

## 2022-08-28 DIAGNOSIS — M25551 Pain in right hip: Secondary | ICD-10-CM | POA: Diagnosis not present

## 2022-09-02 NOTE — Progress Notes (Signed)
Carelink Summary Report / Loop Recorder 

## 2022-09-03 ENCOUNTER — Encounter (INDEPENDENT_AMBULATORY_CARE_PROVIDER_SITE_OTHER): Payer: Self-pay | Admitting: Gastroenterology

## 2022-09-03 ENCOUNTER — Ambulatory Visit (INDEPENDENT_AMBULATORY_CARE_PROVIDER_SITE_OTHER): Payer: Medicare Other | Admitting: Gastroenterology

## 2022-09-03 VITALS — BP 128/53 | HR 77 | Temp 98.1°F | Ht 61.5 in | Wt 199.9 lb

## 2022-09-03 DIAGNOSIS — R058 Other specified cough: Secondary | ICD-10-CM

## 2022-09-03 DIAGNOSIS — K219 Gastro-esophageal reflux disease without esophagitis: Secondary | ICD-10-CM | POA: Diagnosis not present

## 2022-09-03 DIAGNOSIS — R09A2 Foreign body sensation, throat: Secondary | ICD-10-CM | POA: Diagnosis not present

## 2022-09-03 NOTE — Patient Instructions (Signed)
Please continue with your omeprazole daily and famotidine as needed As discussed, we can set up a modified barium swallow for further evaluation, however, if you would like to try dietary changes first, that is okay Be mindful of greasy, spicy, fried, citrus foods, caffeine, carbonated drinks, and chocolate as these  can increase reflux symptoms Stay upright 2-3 hours after eating, prior to lying down and avoid eating late in the evenings.  Please let me know if symptoms fail to improve or if you would like to proceed with that study  I will get labs from your PCP to review as well  Follow up 3 months  It was a pleasure to see you today. I want to create trusting relationships with patients and provide genuine, compassionate, and quality care. I truly value your feedback! please be on the lookout for a survey regarding your visit with me today. I appreciate your input about our visit and your time in completing this!    [name] [name] L. Jeanmarie Hubert, MSN, APRN, AGNP-C Adult-Gerontology Nurse Practitioner [facility]

## 2022-09-03 NOTE — Progress Notes (Signed)
Referring Provider: Kirstie Peri, MD Primary Care Physician:  Kirstie Peri, MD Primary GI Physician:   Chief Complaint  Patient presents with   Gastroesophageal Reflux    Follow up on GERD. Takes omeprazole 40mg  in the morning and pepcid 20mg  as needed. Has been doing well until a couple of weeks ago. Started having some coughing and acid in throat.    Nausea    Having nausea for about 3 weeks. Having some sinus drainage she thinks is causing the nausea. Also started taking iron about 3 weeks ago when nausea started.    HPI:   [name] [name] is a 82 y.o. female with past medical history of anxiety, asthma, A flutter, COPD, depression, HTN, HLD, OSA.     Patient presenting today for follow up of GERD/cough.  Last seen March 2024, at that time GERD is doing fair. Coughing with meals improved some. still having issues with coughing when eating crackers/chips.  worsening cough at night as she is not able to sit upright very long after eating. continued globus sensation after eating several times per week. No dysphagia. denies overt heartburn or acid regurgitation. she does note she coughs up phlegm often and has a lot of sinus issues/post nasal drip. She is doing omeprazole 40mg  BID and pepcid 40mg  maybe twice per week in the evenings. Discussed repeat colonoscopy to which patient declines to undergo any further screenings.  Recommended trial down to once daily PPI, continue pepcid 40mg  PRN, reflux precautions, EGD once cleared by Cards, consider TIF procedures. Could also consider MBS.  Present:  She states she had been doing well doing 1 omeprazole daily up until about 2 weeks ago. Now she is having more issues with phlegm in her throat again and feels that she is having more sinus drainage. She also reports she had been managing her diet better but recently has not been eating well, going out a lot. She had some acid reflux this morning. But mostly having more coughing. She feels a lot of  sinus pressure. She has a productive cough. Dry, crumbly food such as crackers or chips will sometimes trigger her to cough more. No dysphagia.  She is also having some nausea for the past few weeks, she feels it may be related to her sinus drainage as she is having so much of it. Denies vomiting. No abdominal pain. She is on flonase, azelastine and doing a sinus rinse daily as well.   She notes that she had labs a few weeks ago and was told she was anemic, started on iron. Moving bowels well. No rectal bleeding or melena, stools dark since starting Iron pills. She takes benefiber. Stools are 1-3 on Bristol stool scale. Does 2 tsp in the morning, usually having a BM daily. She notes water intake is probably not great.   Notably hgb has been 10-11 range for the past 6-7 years.   Last colonoscopy: 06/09/12 Examination performed to cecum. Few small diverticula at sigmoid colon. Small external hemorrhoids. Last Endoscopy:never   Recommendations:    Past Medical History:  Diagnosis Date   Allergic rhinitis    Anxiety    Asthma    Atrial flutter (HCC)    COPD (chronic obstructive pulmonary disease) (HCC)    Depression    Essential hypertension    Hyperlipidemia    Lumbar disc disease    Obesity    OSA (obstructive sleep apnea)    CPAP   Paroxysmal atrial fibrillation (HCC)    Element  of tachycardia bradycardia syndrome   Respiratory failure (HCC)     Past Surgical History:  Procedure Laterality Date   ANTERIOR FUSION LUMBAR SPINE     ATRIAL FIBRILLATION ABLATION N/A 09/12/2020   Procedure: ATRIAL FIBRILLATION ABLATION;  Surgeon: Hillis Range, MD;  Location: [facility];  Service: Cardiovascular;  Laterality: N/A;   ATRIAL FIBRILLATION ABLATION N/A 03/18/2022   Procedure: ATRIAL FIBRILLATION ABLATION;  Surgeon: Maurice Small, MD;  Location: [facility]VASIVE CV LAB;  Service: Cardiovascular;  Laterality: N/A;   BIOPSY  08/27/2016   Procedure: BIOPSY;  Surgeon: Malissa Hippo, MD;  Location: [facility];  Service: Endoscopy;;  FOUR GASTRIC POLYPS BIOPSIED   CARDIOVERSION N/A 03/28/2014   Procedure: CARDIOVERSION;  Surgeon: Pricilla Riffle, MD;  Location: [facility] ORS;  Service: Cardiovascular;  Laterality: N/A;   CARDIOVERSION N/A 10/22/2016   Procedure: CARDIOVERSION;  Surgeon: Quintella Reichert, MD;  Location: [facility] ENDOSCOPY;  Service: Cardiovascular;  Laterality: N/A;   CARDIOVERSION N/A 11/26/2020   Procedure: CARDIOVERSION;  Surgeon: Pricilla Riffle, MD;  Location: [facility] ENDOSCOPY;  Service: Cardiovascular;  Laterality: N/A;   CATARACT EXTRACTION     COLONOSCOPY N/A 08/04/2012   Procedure: COLONOSCOPY;  Surgeon: Malissa Hippo, MD;  Location: [facility];  Service: Endoscopy;  Laterality: N/A;  730-rescheduled to 1030 Ann notified pt   ELECTROPHYSIOLOGIC STUDY N/A 09/18/2014   Procedure: Atrial Fibrillation Ablation;  Surgeon: Hillis Range, MD;  Location: [facility] INVASIVE CV LAB;  Service: Cardiovascular;  Laterality: N/A;   ESOPHAGOGASTRODUODENOSCOPY N/A 08/27/2016   Procedure: ESOPHAGOGASTRODUODENOSCOPY (EGD);  Surgeon: Malissa Hippo, MD;  Location: [facility];  Service: Endoscopy;  Laterality: N/A;  1200   implantable loop recorder placement  11/22/2020   Medtronic Reveal Linq model LNQ 11 (SN [id number]) implantable loop recorder   LASIK     Both eyes   TEE WITHOUT CARDIOVERSION N/A 09/18/2014   Procedure: TRANSESOPHAGEAL ECHOCARDIOGRAM (TEE);  Surgeon: Laurey Morale, MD;  Location: [facility] ENDOSCOPY;  Service: Cardiovascular;  Laterality: N/A;   TEE WITHOUT CARDIOVERSION N/A 10/22/2016   Procedure: TRANSESOPHAGEAL ECHOCARDIOGRAM (TEE);  Surgeon: Quintella Reichert, MD;  Location: [facility] ENDOSCOPY;  Service: Cardiovascular;  Laterality: N/A;   TEE WITHOUT CARDIOVERSION N/A 08/24/2017   Procedure: TRANSESOPHAGEAL ECHOCARDIOGRAM (TEE);  Surgeon: Wendall Stade, MD;  Location: [facility] ENDOSCOPY;  Service: Cardiovascular;  Laterality: N/A;   TOTAL HIP ARTHROPLASTY  03/30/2008    Right    Current Outpatient Medications  Medication Sig Dispense Refill   antiseptic oral rinse (BIOTENE) LIQD 15 mLs by Mouth Rinse route daily as needed for dry mouth.     atorvastatin (LIPITOR) 10 MG tablet Take 10 mg by mouth daily at 6 PM.     azelastine (ASTELIN) 0.1 % nasal spray Place 2 sprays into both nostrils 2 (two) times daily. Use in each nostril as directed (Patient taking differently: Place 2 sprays into both nostrils daily. Use in each nostril as directed) 30 mL 11   benzonatate (TESSALON) 200 MG capsule Take 1 capsule (200 mg total) by mouth 3 (three) times daily as needed for cough. 30 capsule 3   Biotin 1 MG CAPS Take 1,000 mg by mouth 3 (three) times a week.      budesonide (PULMICORT) 0.5 MG/2ML nebulizer solution Take 0.5 mg by nebulization daily.     Calcium Carbonate-Vit D-Min (QC CALCIUM-MAGNESIUM-ZINC-D3) 333.4-133 MG-UNIT TABS Take 1 tablet by mouth 2 (two) times daily.      cetirizine (ZYRTEC) 10  MG tablet Take 10 mg by mouth at bedtime as needed for allergies.     clindamycin (CLEOCIN) 300 MG capsule Take 600 mg by mouth See admin instructions. Take prior to dental procedures  0   diltiazem (CARDIZEM CD) 120 MG 24 hr capsule Take 1 capsule (120 mg total) by mouth at bedtime.     diltiazem (CARDIZEM CD) 240 MG 24 hr capsule Take 1 capsule (240 mg total) by mouth every morning. 90 capsule 3   diltiazem (CARDIZEM) 30 MG tablet TAKE 1 TABLET BY MOUTH AS NEEDED FOR PALPITATIONS 45 tablet 2   dofetilide (TIKOSYN) 500 MCG capsule Take 1 capsule (500 mcg total) by mouth 2 (two) times daily. 180 capsule 1   famotidine (PEPCID) 20 MG tablet Take 2 tablets (40 mg total) by mouth at bedtime as needed. 30 tablet 1   fluticasone (FLONASE) 50 MCG/ACT nasal spray Place 1 spray into both nostrils 2 (two) times daily.     hydrALAZINE (APRESOLINE) 100 MG tablet Take 100 mg by mouth 2 (two) times daily.     HYDROcodone bit-homatropine (HYCODAN) 5-1.5 MG/5ML syrup Take 5 mLs by mouth  every 6 (six) hours as needed for cough.     Hypertonic Nasal Wash (SINUS RINSE) PACK Place 1 each into the nose daily as needed (clear nasal passage). Neilmed sinus rinse     ipratropium-albuterol (DUONEB) 0.5-2.5 (3) MG/3ML SOLN Take 3 mLs by nebulization daily.     lactobacillus acidophilus (BACID) TABS tablet Take 2 tablets by mouth daily.     losartan (COZAAR) 100 MG tablet TAKE 1 TABLET BY MOUTH DAILY 90 tablet 1   Magnesium 250 MG TABS Take 250 mg by mouth daily.     montelukast (SINGULAIR) 10 MG tablet Take 10 mg by mouth at bedtime.     omeprazole (PRILOSEC) 40 MG capsule Take 1 capsule (40 mg total) by mouth 2 (two) times daily. (Patient taking differently: Take 40 mg by mouth daily. Once daily) 180 capsule 1   Polyethyl Glyc-Propyl Glyc PF (SYSTANE ULTRA PF) 0.4-0.3 % SOLN Place 1 drop into both eyes as needed (dry eyes).     Polyethyl Glycol-Propyl Glycol (SYSTANE) 0.4-0.3 % GEL ophthalmic gel Place 1 Application into both eyes as needed (dry eyes).     potassium chloride SA (KLOR-CON M) 20 MEQ tablet TAKE 1 TABLET BY MOUTH DAILY 90 tablet 3   PRESCRIPTION MEDICATION Iron tablet once a day     rivaroxaban (XARELTO) 20 MG TABS tablet Take 1 tablet (20 mg total) by mouth daily with supper. 90 tablet 3   umeclidinium-vilanterol (ANORO ELLIPTA) 62.5-25 MCG/ACT AEPB INHALE ONE PUFF INTO THE LUNGS DAILY 180 each 3   Wheat Dextrin (BENEFIBER ON THE GO) POWD Take 10 mLs by mouth daily.     White Petrolatum-Mineral Oil (SYSTANE NIGHTTIME) OINT Place 1 drop into both eyes at bedtime. (Patient not taking: Reported on 09/03/2022)     Current Facility-Administered Medications  Medication Dose Route Frequency Provider Last Rate Last Admin   methylPREDNISolone acetate (DEPO-MEDROL) injection 80 mg  80 mg Intramuscular Once Jetty Duhamel D, MD        Allergies as of 09/03/2022 - Review Complete 09/03/2022  Allergen Reaction Noted   Bupropion hcl Other (See Comments)    Escitalopram oxalate Other  (See Comments)    Fluticasone-salmeterol Other (See Comments)    Lisinopril Cough    Serevent Other (See Comments) 05/06/2011    Family History  Problem Relation Age of Onset  Cancer Mother    Heart attack Father    Colon cancer Other     Social History   Socioeconomic History   Marital status: Single    Spouse name: Not on file   Number of children: Not on file   Years of education: Not on file   Highest education level: Not on file  Occupational History   Occupation: Retired: Engineer, site  Tobacco Use   Smoking status: Never    Passive exposure: Past   Smokeless tobacco: Never  Vaping Use   Vaping Use: Never used  Substance and Sexual Activity   Alcohol use: No    Alcohol/week: 0.0 standard drinks of alcohol   Drug use: No   Sexual activity: Not on file  Other Topics Concern   Not on file  Social History Narrative   Pt lives in [place] alone. She was never married.   Retired Psychologist, occupational.   Attends [employer]   Social Determinants of Health   Financial Resource Strain: Not on file  Food Insecurity: Not on file  Transportation Needs: Not on file  Physical Activity: Not on file  Stress: Not on file  Social Connections: Not on file    Review of systems General: negative for malaise, night sweats, fever, chills, weight loss Neck: Negative for lumps, goiter, pain and significant neck swelling Resp: Negative for cough, wheezing, dyspnea at rest CV: Negative for chest pain, leg swelling, palpitations, orthopnea GI: denies melena, hematochezia, nausea, vomiting, diarrhea, constipation, dysphagia, odyonophagia, early satiety or unintentional weight loss. +globus sensation +coughing with eating MSK: Negative for joint pain or swelling, back pain, and muscle pain. Derm: Negative for itching or rash Psych: Denies depression, anxiety, memory loss, confusion. No homicidal or suicidal ideation.  Heme: Negative for prolonged bleeding, bruising  easily, and swollen nodes. Endocrine: Negative for cold or heat intolerance, polyuria, polydipsia and goiter. Neuro: negative for tremor, gait imbalance, syncope and seizures. The remainder of the review of systems is noncontributory.  Physical Exam: BP (!) 128/53 (BP Location: Left Arm, Patient Position: Sitting, Cuff Size: Large)   Pulse 77   Temp 98.1 F (36.7 C) (Oral)   Ht 5' 1.5" (1.562 m)   Wt 199 lb 14.4 oz (90.7 kg)   BMI 37.16 kg/m  General:   Alert and oriented. No distress noted. Pleasant and cooperative.  Head:  Normocephalic and atraumatic. Eyes:  Conjuctiva clear without scleral icterus. Mouth:  Oral mucosa pink and moist. Good dentition. No lesions. Heart: Normal rate and rhythm, s1 and s2 heart sounds present.  Lungs: Clear lung sounds in all lobes. Respirations equal and unlabored. Abdomen:  +BS, soft, non-tender and non-distended. No rebound or guarding. No HSM or masses noted. Derm: No palmar erythema or jaundice Msk:  Symmetrical without gross deformities. Normal posture. Extremities:  Without edema. Neurologic:  Alert and  oriented x4 Psych:  Alert and cooperative. Normal mood and affect.  Invalid input(s): "6 MONTHS"   ASSESSMENT: [name] [name] is a 82 y.o. female presenting today for follow up of GERD/globus sensation/Cough.   GERD/Globus sensation/cough: History of globus sensation and coughing with come Sumption of some foods over the past year or so, thought to be secondary possibly to GERD or combination of GERD and sinus problems.  We have discussed EGD in the past though given her ongoing A-fib and need for repeat ablations this has been on hold.  She had stepdown to once daily PPI dosing and was doing well up until  couple weeks ago when she began to have globus sensation again, she notes some coughing still with drier more crumbly foods, though no overt dysphagia or heartburn.  She also reports her diet has not been as well-managed as it was  previously.  She is having some nausea at times but also notes she has had more postnasal drip and sinus congestion over the past few weeks as well.  She is hesitant to undergo any procedures given her cardiac issues, I did discuss performing a modified barium swallow to evaluate for other causes of her symptoms and rule out any aspiration however she would like to continue with more dietary modifications over the next few weeks and see how her symptoms do.  She has no weight loss, early satiety, rectal bleeding, melena.  She will let me know if she wants to proceed with MBS.  For now we will continue with omeprazole 40 mg once daily and famotidine 20 mg as needed nightly.  Last colonoscopy in 2014, we have discussed repeat colonoscopy to which patient has declined.  She does not wish to undergo any further screening colonoscopies at this time.  She reports that on recent labs with her PCP she was told she had anemia, started on iron pills, per chart review.  She has had some mild anemia over the past few years, to which she was unaware of.  No rectal bleeding or melena, will obtain labs from PCP to evaluate what her blood counts were.   PLAN:  Consider MBS-pt to make me aware if she wishes to proceed 2. Continue with omeprazole 40mg   3. Continue famotidine 20mg  PRN QHS  4. Obtain labs from PCP  5. Good reflux precautions  All questions were answered, patient verbalized understanding and is in agreement with plan as outlined above.   Follow Up: 3 months   Cristy Colmenares L. Jeanmarie Hubert, MSN, APRN, AGNP-C Adult-Gerontology Nurse Practitioner [facility]  I have reviewed the note and agree with the APP's assessment as described in this progress note  Katrinka Blazing, MD Gastroenterology and Hepatology [facility]

## 2022-09-07 ENCOUNTER — Other Ambulatory Visit (INDEPENDENT_AMBULATORY_CARE_PROVIDER_SITE_OTHER): Payer: Self-pay | Admitting: Gastroenterology

## 2022-09-07 ENCOUNTER — Other Ambulatory Visit (HOSPITAL_COMMUNITY): Payer: Self-pay

## 2022-09-07 MED ORDER — DOFETILIDE 500 MCG PO CAPS: 500.0000 ug | ORAL_CAPSULE | Freq: Two times a day (BID) | ORAL | 1 refills | Status: DC

## 2022-09-09 ENCOUNTER — Other Ambulatory Visit (HOSPITAL_COMMUNITY): Payer: Self-pay | Admitting: *Deleted

## 2022-09-09 MED ORDER — DOFETILIDE 500 MCG PO CAPS: 500.0000 ug | ORAL_CAPSULE | Freq: Two times a day (BID) | ORAL | 1 refills | Status: DC

## 2022-09-15 DIAGNOSIS — M25551 Pain in right hip: Secondary | ICD-10-CM | POA: Diagnosis not present

## 2022-09-17 DIAGNOSIS — M25551 Pain in right hip: Secondary | ICD-10-CM | POA: Diagnosis not present

## 2022-09-21 ENCOUNTER — Ambulatory Visit: Payer: Medicare Other | Admitting: Podiatry

## 2022-09-22 ENCOUNTER — Ambulatory Visit: Payer: Medicare Other | Admitting: Podiatry

## 2022-09-22 DIAGNOSIS — M25551 Pain in right hip: Secondary | ICD-10-CM | POA: Diagnosis not present

## 2022-09-23 ENCOUNTER — Ambulatory Visit (INDEPENDENT_AMBULATORY_CARE_PROVIDER_SITE_OTHER): Payer: Medicare Other | Admitting: Podiatry

## 2022-09-23 ENCOUNTER — Encounter: Payer: Self-pay | Admitting: Podiatry

## 2022-09-23 VITALS — BP 147/56 | HR 53

## 2022-09-23 DIAGNOSIS — B351 Tinea unguium: Secondary | ICD-10-CM | POA: Diagnosis not present

## 2022-09-23 DIAGNOSIS — M79675 Pain in left toe(s): Secondary | ICD-10-CM | POA: Diagnosis not present

## 2022-09-23 DIAGNOSIS — M79674 Pain in right toe(s): Secondary | ICD-10-CM

## 2022-09-23 NOTE — Progress Notes (Signed)
  Subjective:  Patient ID: [name] [name], female    DOB: 02-18-1941,  MRN: [id number]  [name] [name] presents to clinic today for painful elongated mycotic toenails 1-5 bilaterally which are tender when wearing enclosed shoe gear. Pain is relieved with periodic professional debridement. She is seeing Dr. Ardelle Anton for gait instability and will follow up with him in a couple of weeks. Chief Complaint  Patient presents with   Nail Problem    "Trim my toenails."   New problem(s): None.   PCP is Kirstie Peri, MD.  Allergies  Allergen Reactions   Bupropion Hcl Other (See Comments)    Suicidal Thoughts.    Escitalopram Oxalate Other (See Comments)    Suicidal Thoughts.    Fluticasone-Salmeterol Other (See Comments)    Advair - Caused patient to go into Afib.    Lisinopril Cough   Serevent Other (See Comments)    Caused patient to go into Afib    Review of Systems: Negative except as noted in the HPI.  Objective: No changes noted in today's physical examination. Vitals:   09/23/22 1325  BP: (!) 147/56  Pulse: (!) 53   [name] [name] is a pleasant 82 y.o. female in NAD. AAO x 3.  Vascular Examination: Capillary refill time immediate b/l. Vascular status intact b/l with palpable pedal pulses. Pedal hair absent b/l. No pain with calf compression b/l. Skin temperature gradient WNL b/l. No cyanosis or clubbing b/l. No ischemia or gangrene noted b/l. No edema noted b/l LE.  Neurological Examination: Sensation grossly intact b/l with 10 gram monofilament. Vibratory sensation intact b/l. Pt has subjective symptoms of neuropathy.  Dermatological Examination: Pedal skin with normal turgor, texture and tone b/l.  No open wounds. No interdigital macerations.   Toenails 1-5 b/l thick, discolored, elongated with subungual debris and pain on dorsal palpation.   No hyperkeratotic nor porokeratotic lesions present on today's visit.  Musculoskeletal Examination: Muscle strength  5/5 to all lower extremity muscle groups bilaterally. Limited joint ROM to the 1st MPJ left foot. 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 Pes planus deformity noted bilateral LE.  Radiographs: None  Assessment/Plan: 1. Pain due to onychomycosis of toenails of both feet     -Patient was evaluated and treated. All patient's and/or POA's questions/concerns answered on today's visit. -Patient to continue soft, supportive shoe gear daily. -Mycotic toenails 1-5 bilaterally were debrided in length and girth with sterile nail nippers and dremel without incident. -Patient/POA to call should there be question/concern in the interim.   Return in about 9 weeks (around 11/25/2022).  Freddie Breech, DPM

## 2022-09-24 ENCOUNTER — Encounter (HOSPITAL_COMMUNITY)
Admission: RE | Admit: 2022-09-24 | Discharge: 2022-09-24 | Disposition: A | Discharge status: Home / Self Care | Payer: Medicare Other | Source: Ambulatory Visit | Attending: Internal Medicine | Admitting: Internal Medicine

## 2022-09-24 VITALS — BP 141/64 | HR 64 | Temp 97.8°F | Resp 16

## 2022-09-24 DIAGNOSIS — J4520 Mild intermittent asthma, uncomplicated: Secondary | ICD-10-CM | POA: Insufficient documentation

## 2022-09-24 MED ORDER — MEPOLIZUMAB 100 MG ~~LOC~~ SOLR
100.0000 mg | Freq: Once | SUBCUTANEOUS | Status: AC
  Administered 2022-09-24: 100 mg via SUBCUTANEOUS
  Filled 2022-09-24: qty 1

## 2022-09-24 NOTE — Addendum Note (Signed)
Encounter addended by: Arrie Senate, RN on: 09/24/2022 11:29 AM  Actions taken: Therapy plan modified

## 2022-09-24 NOTE — Progress Notes (Signed)
Diagnosis: Asthma  Provider:   Dr  Jetty Duhamel   Procedure: Injection  Nucala (Mepolizumab), Dose: 100 mg, Site: subcutaneous, Number of injections: 1  Administered in right arm.  Post Care: Patient declined observation  Discharge: Condition: Good, Destination: Home . AVS declined.  Performed by:  Wyvonne Lenz, RN

## 2022-09-29 ENCOUNTER — Ambulatory Visit: Payer: Medicare Other | Admitting: Podiatry

## 2022-09-29 DIAGNOSIS — M25551 Pain in right hip: Secondary | ICD-10-CM | POA: Diagnosis not present

## 2022-10-02 DIAGNOSIS — M25551 Pain in right hip: Secondary | ICD-10-CM | POA: Diagnosis not present

## 2022-10-05 DIAGNOSIS — I10 Essential (primary) hypertension: Secondary | ICD-10-CM | POA: Diagnosis not present

## 2022-10-05 DIAGNOSIS — M25551 Pain in right hip: Secondary | ICD-10-CM | POA: Diagnosis not present

## 2022-10-05 DIAGNOSIS — E611 Iron deficiency: Secondary | ICD-10-CM | POA: Diagnosis not present

## 2022-10-05 DIAGNOSIS — E1165 Type 2 diabetes mellitus with hyperglycemia: Secondary | ICD-10-CM | POA: Diagnosis not present

## 2022-10-05 DIAGNOSIS — Z299 Encounter for prophylactic measures, unspecified: Secondary | ICD-10-CM | POA: Diagnosis not present

## 2022-10-12 ENCOUNTER — Ambulatory Visit (INDEPENDENT_AMBULATORY_CARE_PROVIDER_SITE_OTHER): Payer: Medicare Other | Admitting: Podiatry

## 2022-10-12 DIAGNOSIS — M722 Plantar fascial fibromatosis: Secondary | ICD-10-CM | POA: Diagnosis not present

## 2022-10-12 DIAGNOSIS — M21619 Bunion of unspecified foot: Secondary | ICD-10-CM

## 2022-10-12 DIAGNOSIS — M2041 Other hammer toe(s) (acquired), right foot: Secondary | ICD-10-CM | POA: Diagnosis not present

## 2022-10-12 DIAGNOSIS — M2042 Other hammer toe(s) (acquired), left foot: Secondary | ICD-10-CM | POA: Diagnosis not present

## 2022-10-12 DIAGNOSIS — M2061 Acquired deformities of toe(s), unspecified, right foot: Secondary | ICD-10-CM

## 2022-10-12 DIAGNOSIS — R2681 Unsteadiness on feet: Secondary | ICD-10-CM

## 2022-10-12 MED ORDER — GABAPENTIN 100 MG PO CAPS: 100.0000 mg | ORAL_CAPSULE | Freq: Every day | ORAL | 0 refills | Status: DC

## 2022-10-12 NOTE — Progress Notes (Signed)
Subjective: Chief Complaint  Patient presents with   Foot Pain    Patient came in today for bilateral foot pain, questions about her orthotics     82 year old female presents the office with above concerns. She was doing pretty good for some time of the last couple weeks so she started having increased pain to her feet more on the ball of the foot as well.  She states that she is doing physical therapy and is to increase her stretching and has been doing well however again recently she started having increased pain afterwards and she feels all of her muscles in her lower extremities "locked up" after.  She does have neuropathy she is getting some numbness and occasional burning to her feet.  Her neuropathy symptoms seem to be improving she states.  Objective: AAO x3, NAD DP/PT pulses palpable bilaterally, CRT less than 3 seconds She has some tenderness.  The bunion on the right foot submetatarsal bilaterally.  There is no area of pinpoint tenderness.  Flexor, extensor tendons appear to be intact.  Currently her supply.  No pain with calf compression, swelling, warmth, erythema  Assessment: Gait instability; bunion, metatarsalgia  Plan: -All treatment options discussed with the patient including all alternatives, risks, complications.  -She does have neuropathy and this could be causing her pain as well as she gets pain at night.  Will start gabapentin 100 mg at nighttime discussed side effects. -I did do a taping on her right bunion today see if this will be helpful -May try custom inserts did not see much improvement so especially trying over-the-counter power step.  If needed will make further adjustments to orthotics.  I also like her to continue physical therapy.  [name] DPM

## 2022-10-12 NOTE — Patient Instructions (Signed)
Gabapentin Capsules or Tablets What is this medication? GABAPENTIN (GA ba pen tin) treats nerve pain. It may also be used to prevent and control seizures in people with epilepsy. It works by calming overactive nerves in your body. This medicine may be used for other purposes; ask your health care provider or pharmacist if you have questions. COMMON BRAND NAME(S): Active-PAC with Gabapentin, Gabarone, Gralise, Neurontin What should I tell my care team before I take this medication? They need to know if you have any of these conditions: Kidney disease Lung or breathing disease Substance use disorder Suicidal thoughts, plans, or attempt by you or a family member An unusual or allergic reaction to gabapentin, other medications, foods, dyes, or preservatives Pregnant or trying to get pregnant Breastfeeding How should I use this medication? Take this medication by mouth with a glass of water. Follow the directions on the prescription label. You can take it with or without food. If it upsets your stomach, take it with food. Take your medication at regular intervals. Do not take it more often than directed. Do not stop taking except on your care team's advice. If you are directed to break the 600 or 800 mg tablets in half as part of your dose, the extra half tablet should be used for the next dose. If you have not used the extra half tablet within 28 days, it should be thrown away. A special MedGuide will be given to you by the pharmacist with each prescription and refill. Be sure to read this information carefully each time. Talk to your care team about the use of this medication in children. While this medication may be prescribed for children as young as 3 years for selected conditions, precautions do apply. Overdosage: If you think you have taken too much of this medicine contact a poison control center or emergency room at once. NOTE: This medicine is only for you. Do not share this medicine with  others. What if I miss a dose? If you miss a dose, take it as soon as you can. If it is almost time for your next dose, take only that dose. Do not take double or extra doses. What may interact with this medication? Alcohol Antihistamines for allergy, cough, and cold Certain medications for anxiety or sleep Certain medications for depression like amitriptyline, fluoxetine, sertraline Certain medications for seizures like phenobarbital, primidone Certain medications for stomach problems General anesthetics like halothane, isoflurane, methoxyflurane, propofol Local anesthetics like lidocaine, pramoxine, tetracaine Medications that relax muscles for surgery Opioid medications for pain Phenothiazines like chlorpromazine, mesoridazine, prochlorperazine, thioridazine This list may not describe all possible interactions. Give your health care provider a list of all the medicines, herbs, non-prescription drugs, or dietary supplements you use. Also tell them if you smoke, drink alcohol, or use illegal drugs. Some items may interact with your medicine. What should I watch for while using this medication? Visit your care team for regular checks on your progress. You may want to keep a record at home of how you feel your condition is responding to treatment. You may want to share this information with your care team at each visit. You should contact your care team if your seizures get worse or if you have any new types of seizures. Do not stop taking this medication or any of your seizure medications unless instructed by your care team. Stopping your medication suddenly can increase your seizures or their severity. This medication may cause serious skin reactions. They can happen weeks to   months after starting the medication. Contact your care team right away if you notice fevers or flu-like symptoms with a rash. The rash may be red or purple and then turn into blisters or peeling of the skin. Or, you might  notice a red rash with swelling of the face, lips or lymph nodes in your neck or under your arms. Wear a medical identification bracelet or chain if you are taking this medication for seizures. Carry a card that lists all your medications. This medication may affect your coordination, reaction time, or judgment. Do not drive or operate machinery until you know how this medication affects you. Sit up or stand slowly to reduce the risk of dizzy or fainting spells. Drinking alcohol with this medication can increase the risk of these side effects. Your mouth may get dry. Chewing sugarless gum or sucking hard candy, and drinking plenty of water may help. Watch for new or worsening thoughts of suicide or depression. This includes sudden changes in mood, behaviors, or thoughts. These changes can happen at any time but are more common in the beginning of treatment or after a change in dose. Call your care team right away if you experience these thoughts or worsening depression. If you become pregnant while using this medication, you may enroll in the [place] Antiepileptic Drug Pregnancy Registry by calling 1-888-233-2334. This registry collects information about the safety of antiepileptic medication use during pregnancy. What side effects may I notice from receiving this medication? Side effects that you should report to your care team as soon as possible: Allergic reactions or angioedema--skin rash, itching, hives, swelling of the face, eyes, lips, tongue, arms, or legs, trouble swallowing or breathing Rash, fever, and swollen lymph nodes Thoughts of suicide or self harm, worsening mood, feelings of depression Trouble breathing Unusual changes in mood or behavior in children after use such as difficulty concentrating, hostility, or restlessness Side effects that usually do not require medical attention (report to your care team if they continue or are  bothersome): Dizziness Drowsiness Nausea Swelling of ankles, feet, or hands Vomiting This list may not describe all possible side effects. Call your doctor for medical advice about side effects. You may report side effects to FDA at 1-800-FDA-1088. Where should I keep my medication? Keep out of reach of children and pets. Store at room temperature between 15 and 30 degrees C (59 and 86 degrees F). Get rid of any unused medication after the expiration date. This medication may cause accidental overdose and death if taken by other adults, children, or pets. To get rid of medications that are no longer needed or have expired: Take the medication to a medication take-back program. Check with your pharmacy or law enforcement to find a location. If you cannot return the medication, check the label or package insert to see if the medication should be thrown out in the garbage or flushed down the toilet. If you are not sure, ask your care team. If it is safe to put it in the trash, empty the medication out of the container. Mix the medication with cat litter, dirt, coffee grounds, or other unwanted substance. Seal the mixture in a bag or container. Put it in the trash. NOTE: This sheet is a summary. It may not cover all possible information. If you have questions about this medicine, talk to your doctor, pharmacist, or health care provider.  2024 Elsevier/Gold Standard (2021-12-30 00:00:00)  

## 2022-10-13 ENCOUNTER — Ambulatory Visit (INDEPENDENT_AMBULATORY_CARE_PROVIDER_SITE_OTHER): Payer: Medicare Other

## 2022-10-13 DIAGNOSIS — I495 Sick sinus syndrome: Secondary | ICD-10-CM

## 2022-10-13 DIAGNOSIS — M25551 Pain in right hip: Secondary | ICD-10-CM | POA: Diagnosis not present

## 2022-10-14 LAB — CUP PACEART REMOTE DEVICE CHECK
Date Time Interrogation Session: 20240715233244
Implantable Pulse Generator Implant Date: 20220826

## 2022-10-15 ENCOUNTER — Encounter: Payer: Self-pay | Admitting: Podiatry

## 2022-10-16 DIAGNOSIS — M25551 Pain in right hip: Secondary | ICD-10-CM | POA: Diagnosis not present

## 2022-10-21 DIAGNOSIS — M25551 Pain in right hip: Secondary | ICD-10-CM | POA: Diagnosis not present

## 2022-10-22 ENCOUNTER — Encounter (HOSPITAL_COMMUNITY)
Admission: RE | Admit: 2022-10-22 | Discharge: 2022-10-22 | Disposition: A | Discharge status: Home / Self Care | Payer: Medicare Other | Source: Ambulatory Visit | Attending: Internal Medicine | Admitting: Internal Medicine

## 2022-10-22 VITALS — BP 130/54 | HR 63 | Resp 20

## 2022-10-22 DIAGNOSIS — J4520 Mild intermittent asthma, uncomplicated: Secondary | ICD-10-CM | POA: Diagnosis not present

## 2022-10-22 MED ORDER — MEPOLIZUMAB 100 MG ~~LOC~~ SOLR
100.0000 mg | Freq: Once | SUBCUTANEOUS | Status: AC
  Administered 2022-10-22: 100 mg via SUBCUTANEOUS
  Filled 2022-10-22: qty 1

## 2022-10-22 NOTE — Progress Notes (Signed)
Diagnosis: Asthma  Provider:   Dr Jetty Duhamel   Procedure: Injection  Nucala (Mepolizumab), Dose: 100 mg, Site: subcutaneous, Number of injections: 1  Left arm  Post Care: Patient declined observation  Discharge: Condition: Good, Destination: Home . AVS Declined  Performed by:  Marlow Baars [place], RN

## 2022-10-22 NOTE — Addendum Note (Signed)
Encounter addended by: Arrie Senate, RN on: 10/22/2022 11:26 AM  Actions taken: Therapy plan modified

## 2022-10-23 ENCOUNTER — Inpatient Hospital Stay (HOSPITAL_COMMUNITY): Admission: RE | Admit: 2022-10-23 | Payer: Medicare Other | Source: Ambulatory Visit

## 2022-10-23 DIAGNOSIS — M25551 Pain in right hip: Secondary | ICD-10-CM | POA: Diagnosis not present

## 2022-10-28 DIAGNOSIS — M25551 Pain in right hip: Secondary | ICD-10-CM | POA: Diagnosis not present

## 2022-10-28 DIAGNOSIS — I10 Essential (primary) hypertension: Secondary | ICD-10-CM | POA: Diagnosis not present

## 2022-10-29 NOTE — Progress Notes (Signed)
Carelink Summary Report / Loop Recorder 

## 2022-11-03 ENCOUNTER — Ambulatory Visit (INDEPENDENT_AMBULATORY_CARE_PROVIDER_SITE_OTHER): Payer: Medicare Other | Admitting: Podiatry

## 2022-11-03 DIAGNOSIS — M722 Plantar fascial fibromatosis: Secondary | ICD-10-CM

## 2022-11-03 DIAGNOSIS — M2061 Acquired deformities of toe(s), unspecified, right foot: Secondary | ICD-10-CM

## 2022-11-03 DIAGNOSIS — M2041 Other hammer toe(s) (acquired), right foot: Secondary | ICD-10-CM

## 2022-11-03 DIAGNOSIS — R2681 Unsteadiness on feet: Secondary | ICD-10-CM | POA: Diagnosis not present

## 2022-11-03 DIAGNOSIS — M2042 Other hammer toe(s) (acquired), left foot: Secondary | ICD-10-CM | POA: Diagnosis not present

## 2022-11-04 DIAGNOSIS — J441 Chronic obstructive pulmonary disease with (acute) exacerbation: Secondary | ICD-10-CM | POA: Diagnosis not present

## 2022-11-04 DIAGNOSIS — Z299 Encounter for prophylactic measures, unspecified: Secondary | ICD-10-CM | POA: Diagnosis not present

## 2022-11-04 DIAGNOSIS — R6889 Other general symptoms and signs: Secondary | ICD-10-CM | POA: Diagnosis not present

## 2022-11-07 NOTE — Progress Notes (Signed)
Subjective: No chief complaint on file.    82 year old female presents the office today for follow evaluation of her foot pain and to evaluate inserts, shoes.  She thinks that she is making progress that she is in the right direction.  She is awaiting a new pair of shoes as if she is that she got her too small.  She has no systematic irritation of the left big toe but does not recall any injury or rubbing on the toe.  Objective: AAO x3, NAD DP/PT pulses palpable bilaterally, CRT less than 3 seconds Digital contractures are present as well as bunion.  Prominent metatarsal heads are present.  Unable to elicit any area of pinpoint tenderness.  Flexor, sensory tendons appear to be intact.  Only along the medial aspect left hallux there is superficial area of skin breakdown, irritation noted.  There is no edema, erythema or signs of infection otherwise.  No drainage or pus. No pain with calf compression, swelling, warmth, erythema  Assessment: Gait instability; bunion, metatarsalgia  Plan: -All treatment options discussed with the patient including all alternatives, risks, complications. -She does seem to be getting better with his shoes, inserts.  Working on getting the new shoes and then if needed will add the orthotic inside of this.  She is in continue physical therapy as well as this has been helpful. -Continue gabapentin. -Antibiotic ointment and offloading the left big toe.  Not resolved next couple weeks and will be no worsening of his any worsening.  Monitor any signs or symptoms of infection. -May try custom inserts did not see much improvement so especially trying over-the-counter power step.  If needed will make further adjustments to orthotics.  I also like her to continue physical therapy.  [name] DPM

## 2022-11-11 DIAGNOSIS — Z299 Encounter for prophylactic measures, unspecified: Secondary | ICD-10-CM | POA: Diagnosis not present

## 2022-11-11 DIAGNOSIS — E1169 Type 2 diabetes mellitus with other specified complication: Secondary | ICD-10-CM | POA: Diagnosis not present

## 2022-11-11 DIAGNOSIS — M25551 Pain in right hip: Secondary | ICD-10-CM | POA: Diagnosis not present

## 2022-11-11 DIAGNOSIS — E0842 Diabetes mellitus due to underlying condition with diabetic polyneuropathy: Secondary | ICD-10-CM | POA: Diagnosis not present

## 2022-11-11 DIAGNOSIS — I10 Essential (primary) hypertension: Secondary | ICD-10-CM | POA: Diagnosis not present

## 2022-11-11 DIAGNOSIS — D692 Other nonthrombocytopenic purpura: Secondary | ICD-10-CM | POA: Diagnosis not present

## 2022-11-11 DIAGNOSIS — J449 Chronic obstructive pulmonary disease, unspecified: Secondary | ICD-10-CM | POA: Diagnosis not present

## 2022-11-12 DIAGNOSIS — M25551 Pain in right hip: Secondary | ICD-10-CM | POA: Diagnosis not present

## 2022-11-16 ENCOUNTER — Ambulatory Visit (INDEPENDENT_AMBULATORY_CARE_PROVIDER_SITE_OTHER): Payer: Medicare Other

## 2022-11-16 DIAGNOSIS — I4819 Other persistent atrial fibrillation: Secondary | ICD-10-CM | POA: Diagnosis not present

## 2022-11-16 DIAGNOSIS — U071 COVID-19: Secondary | ICD-10-CM | POA: Diagnosis not present

## 2022-11-16 DIAGNOSIS — J441 Chronic obstructive pulmonary disease with (acute) exacerbation: Secondary | ICD-10-CM | POA: Diagnosis not present

## 2022-11-16 DIAGNOSIS — Z299 Encounter for prophylactic measures, unspecified: Secondary | ICD-10-CM | POA: Diagnosis not present

## 2022-11-16 DIAGNOSIS — R6889 Other general symptoms and signs: Secondary | ICD-10-CM | POA: Diagnosis not present

## 2022-11-16 DIAGNOSIS — J020 Streptococcal pharyngitis: Secondary | ICD-10-CM | POA: Diagnosis not present

## 2022-11-16 LAB — CUP PACEART REMOTE DEVICE CHECK
Date Time Interrogation Session: 20240817230958
Implantable Pulse Generator Implant Date: 20220826

## 2022-11-18 ENCOUNTER — Telehealth: Payer: Self-pay

## 2022-11-18 NOTE — Telephone Encounter (Signed)
Auth Submission: NO AUTH NEEDED Site of care: Site of care: [facility] Payer: Medicare A/B, BCBS supp Medication & CPT/J Code(s) submitted: Nucala (Mepolizumab) [id number] Route of submission (phone, fax, portal):  Phone # Fax # Auth type: Buy/Bill HB Units/visits requested: 100mg  q4weeks Reference number:  Approval from: 11/18/22 to 03/30/23

## 2022-11-19 ENCOUNTER — Ambulatory Visit: Payer: Medicare Other

## 2022-11-19 ENCOUNTER — Encounter (HOSPITAL_COMMUNITY): Admission: RE | Admit: 2022-11-19 | Payer: Medicare Other | Source: Ambulatory Visit

## 2022-11-20 ENCOUNTER — Ambulatory Visit (HOSPITAL_COMMUNITY): Payer: Medicare Other

## 2022-11-20 ENCOUNTER — Ambulatory Visit: Payer: Medicare Other | Admitting: Internal Medicine

## 2022-11-23 DIAGNOSIS — I10 Essential (primary) hypertension: Secondary | ICD-10-CM | POA: Diagnosis not present

## 2022-11-23 DIAGNOSIS — J020 Streptococcal pharyngitis: Secondary | ICD-10-CM | POA: Diagnosis not present

## 2022-11-23 DIAGNOSIS — U071 COVID-19: Secondary | ICD-10-CM | POA: Diagnosis not present

## 2022-11-23 DIAGNOSIS — Z299 Encounter for prophylactic measures, unspecified: Secondary | ICD-10-CM | POA: Diagnosis not present

## 2022-11-25 ENCOUNTER — Other Ambulatory Visit (HOSPITAL_COMMUNITY): Payer: Self-pay | Admitting: Cardiology

## 2022-11-25 ENCOUNTER — Ambulatory Visit (INDEPENDENT_AMBULATORY_CARE_PROVIDER_SITE_OTHER): Payer: Medicare Other | Admitting: Podiatry

## 2022-11-25 DIAGNOSIS — Z91199 Patient's noncompliance with other medical treatment and regimen due to unspecified reason: Secondary | ICD-10-CM

## 2022-11-25 NOTE — Progress Notes (Signed)
Carelink Summary Report / Loop Recorder 

## 2022-11-25 NOTE — Progress Notes (Signed)
1. No-show for appointment     

## 2022-11-26 ENCOUNTER — Ambulatory Visit: Payer: Medicare Other

## 2022-11-28 DIAGNOSIS — I10 Essential (primary) hypertension: Secondary | ICD-10-CM | POA: Diagnosis not present

## 2022-12-01 DIAGNOSIS — I4891 Unspecified atrial fibrillation: Secondary | ICD-10-CM | POA: Diagnosis not present

## 2022-12-01 DIAGNOSIS — R059 Cough, unspecified: Secondary | ICD-10-CM | POA: Diagnosis not present

## 2022-12-01 DIAGNOSIS — I10 Essential (primary) hypertension: Secondary | ICD-10-CM | POA: Diagnosis not present

## 2022-12-01 DIAGNOSIS — Z299 Encounter for prophylactic measures, unspecified: Secondary | ICD-10-CM | POA: Diagnosis not present

## 2022-12-01 DIAGNOSIS — I700 Atherosclerosis of aorta: Secondary | ICD-10-CM | POA: Diagnosis not present

## 2022-12-02 ENCOUNTER — Encounter: Payer: Medicare Other | Attending: Internal Medicine | Admitting: *Deleted

## 2022-12-02 VITALS — BP 149/67 | HR 66 | Temp 98.0°F | Resp 16

## 2022-12-02 DIAGNOSIS — J4521 Mild intermittent asthma with (acute) exacerbation: Secondary | ICD-10-CM | POA: Insufficient documentation

## 2022-12-02 MED ORDER — MEPOLIZUMAB 100 MG ~~LOC~~ SOLR
100.0000 mg | Freq: Once | SUBCUTANEOUS | Status: AC
  Administered 2022-12-02: 100 mg via SUBCUTANEOUS

## 2022-12-02 NOTE — Progress Notes (Addendum)
Diagnosis: Asthma  Provider:   Dr. Jetty Duhamel  Procedure: Injection  Nucala (Mepolizumab), Dose: 100 mg, Site: subcutaneous, Number of injections: 1  Right Arm  Post Care: Observation period completed  Discharge: Condition: Good, Destination: Home . AVS Provided  Performed by:  Daleen Squibb, RN

## 2022-12-03 DIAGNOSIS — M25551 Pain in right hip: Secondary | ICD-10-CM | POA: Diagnosis not present

## 2022-12-07 DIAGNOSIS — M25551 Pain in right hip: Secondary | ICD-10-CM | POA: Diagnosis not present

## 2022-12-10 DIAGNOSIS — M25551 Pain in right hip: Secondary | ICD-10-CM | POA: Diagnosis not present

## 2022-12-10 DIAGNOSIS — R110 Nausea: Secondary | ICD-10-CM | POA: Diagnosis not present

## 2022-12-10 DIAGNOSIS — R059 Cough, unspecified: Secondary | ICD-10-CM | POA: Diagnosis not present

## 2022-12-10 DIAGNOSIS — Z299 Encounter for prophylactic measures, unspecified: Secondary | ICD-10-CM | POA: Diagnosis not present

## 2022-12-10 DIAGNOSIS — J019 Acute sinusitis, unspecified: Secondary | ICD-10-CM | POA: Diagnosis not present

## 2022-12-10 DIAGNOSIS — R5383 Other fatigue: Secondary | ICD-10-CM | POA: Diagnosis not present

## 2022-12-14 ENCOUNTER — Telehealth: Payer: Self-pay | Admitting: Cardiology

## 2022-12-14 DIAGNOSIS — M25551 Pain in right hip: Secondary | ICD-10-CM | POA: Diagnosis not present

## 2022-12-14 MED ORDER — DILTIAZEM HCL ER COATED BEADS 240 MG PO CP24: 240.0000 mg | ORAL_CAPSULE | Freq: Every morning | ORAL | 0 refills | Status: DC

## 2022-12-14 NOTE — Telephone Encounter (Signed)
Refill sent.

## 2022-12-14 NOTE — Telephone Encounter (Signed)
*  STAT* If patient is at the pharmacy, call can be transferred to refill team.   1. Which medications need to be refilled? (please list name of each medication and dose if known) diltiazem (CARDIZEM CD) 120 MG 24 hr capsule    2. Would you like to learn more about the convenience, safety, & potential cost savings by using the [facility]? NOT SURE   3. Are you open to using the [facility] (Type [facility]. NOT SURE    4. Which pharmacy/location (including street and city if local pharmacy) is medication to be sent to?  [facility]   5. Do they need a 30 day or 90 day supply? 90

## 2022-12-16 DIAGNOSIS — M25551 Pain in right hip: Secondary | ICD-10-CM | POA: Diagnosis not present

## 2022-12-17 ENCOUNTER — Telehealth: Payer: Self-pay | Admitting: Cardiology

## 2022-12-17 ENCOUNTER — Inpatient Hospital Stay (HOSPITAL_COMMUNITY): Admission: RE | Admit: 2022-12-17 | Payer: Medicare Other | Source: Ambulatory Visit

## 2022-12-17 MED ORDER — DILTIAZEM HCL ER COATED BEADS 240 MG PO CP24: 240.0000 mg | ORAL_CAPSULE | Freq: Every morning | ORAL | 0 refills | Status: DC

## 2022-12-17 NOTE — Telephone Encounter (Signed)
Done

## 2022-12-17 NOTE — Telephone Encounter (Signed)
Patient called in regards to her refill yesterday regarding Diltiazem.  She takes (2) different Diltiazem pills with different mgs. The one called in was not ready to be refilled.   [facility]

## 2022-12-18 ENCOUNTER — Ambulatory Visit (HOSPITAL_COMMUNITY): Payer: Medicare Other

## 2022-12-18 DIAGNOSIS — M9903 Segmental and somatic dysfunction of lumbar region: Secondary | ICD-10-CM | POA: Diagnosis not present

## 2022-12-18 DIAGNOSIS — M25571 Pain in right ankle and joints of right foot: Secondary | ICD-10-CM | POA: Diagnosis not present

## 2022-12-18 DIAGNOSIS — M9902 Segmental and somatic dysfunction of thoracic region: Secondary | ICD-10-CM | POA: Diagnosis not present

## 2022-12-18 DIAGNOSIS — M62830 Muscle spasm of back: Secondary | ICD-10-CM | POA: Diagnosis not present

## 2022-12-18 DIAGNOSIS — M9901 Segmental and somatic dysfunction of cervical region: Secondary | ICD-10-CM | POA: Diagnosis not present

## 2022-12-18 DIAGNOSIS — M546 Pain in thoracic spine: Secondary | ICD-10-CM | POA: Diagnosis not present

## 2022-12-18 DIAGNOSIS — M25572 Pain in left ankle and joints of left foot: Secondary | ICD-10-CM | POA: Diagnosis not present

## 2022-12-21 ENCOUNTER — Telehealth: Payer: Self-pay | Admitting: Cardiology

## 2022-12-21 ENCOUNTER — Ambulatory Visit (INDEPENDENT_AMBULATORY_CARE_PROVIDER_SITE_OTHER): Payer: Medicare Other

## 2022-12-21 DIAGNOSIS — I4819 Other persistent atrial fibrillation: Secondary | ICD-10-CM | POA: Diagnosis not present

## 2022-12-21 DIAGNOSIS — D225 Melanocytic nevi of trunk: Secondary | ICD-10-CM | POA: Diagnosis not present

## 2022-12-21 DIAGNOSIS — C4441 Basal cell carcinoma of skin of scalp and neck: Secondary | ICD-10-CM | POA: Diagnosis not present

## 2022-12-21 DIAGNOSIS — L814 Other melanin hyperpigmentation: Secondary | ICD-10-CM | POA: Diagnosis not present

## 2022-12-21 DIAGNOSIS — L821 Other seborrheic keratosis: Secondary | ICD-10-CM | POA: Diagnosis not present

## 2022-12-21 DIAGNOSIS — Z85828 Personal history of other malignant neoplasm of skin: Secondary | ICD-10-CM | POA: Diagnosis not present

## 2022-12-21 DIAGNOSIS — I495 Sick sinus syndrome: Secondary | ICD-10-CM

## 2022-12-21 DIAGNOSIS — L570 Actinic keratosis: Secondary | ICD-10-CM | POA: Diagnosis not present

## 2022-12-21 DIAGNOSIS — D485 Neoplasm of uncertain behavior of skin: Secondary | ICD-10-CM | POA: Diagnosis not present

## 2022-12-21 DIAGNOSIS — Z08 Encounter for follow-up examination after completed treatment for malignant neoplasm: Secondary | ICD-10-CM | POA: Diagnosis not present

## 2022-12-21 LAB — CUP PACEART REMOTE DEVICE CHECK
Date Time Interrogation Session: 20240920030332
Implantable Pulse Generator Implant Date: 20220826

## 2022-12-21 MED ORDER — DILTIAZEM HCL ER COATED BEADS 120 MG PO CP24: 120.0000 mg | ORAL_CAPSULE | Freq: Every evening | ORAL | 1 refills | Status: DC

## 2022-12-21 NOTE — Telephone Encounter (Signed)
Correct dose filled

## 2022-12-21 NOTE — Telephone Encounter (Signed)
    1. Which medications need to be refilled? (please list name of each medication and dose if known) diltiazem (CARDIZEM CD) 120 MG 24 hr capsule      2. Would you like to learn more about the convenience, safety, & potential cost savings by using the [facility]? NOT SURE     3. Are you open to using the [facility] (Type [facility]. NOT SURE      4. Which pharmacy/location (including street and city if local pharmacy) is medication to be sent to?  [facility]     5. Do they need a 30 day or 90 day supply? 90

## 2022-12-23 DIAGNOSIS — M25551 Pain in right hip: Secondary | ICD-10-CM | POA: Diagnosis not present

## 2022-12-28 DIAGNOSIS — I10 Essential (primary) hypertension: Secondary | ICD-10-CM | POA: Diagnosis not present

## 2022-12-29 ENCOUNTER — Ambulatory Visit (INDEPENDENT_AMBULATORY_CARE_PROVIDER_SITE_OTHER): Payer: Medicare Other | Admitting: Gastroenterology

## 2022-12-29 ENCOUNTER — Encounter (INDEPENDENT_AMBULATORY_CARE_PROVIDER_SITE_OTHER): Payer: Self-pay | Admitting: Gastroenterology

## 2022-12-29 VITALS — BP 145/72 | HR 64 | Temp 97.1°F | Ht 61.5 in | Wt 193.3 lb

## 2022-12-29 DIAGNOSIS — R059 Cough, unspecified: Secondary | ICD-10-CM

## 2022-12-29 DIAGNOSIS — K219 Gastro-esophageal reflux disease without esophagitis: Secondary | ICD-10-CM

## 2022-12-29 DIAGNOSIS — R058 Other specified cough: Secondary | ICD-10-CM

## 2022-12-29 DIAGNOSIS — D649 Anemia, unspecified: Secondary | ICD-10-CM

## 2022-12-29 NOTE — Progress Notes (Signed)
Referring Provider: Kirstie Peri, MD Primary Care Physician:  Kirstie Peri, MD Primary GI Physician: Dr. Levon Hedger   Chief Complaint  Patient presents with   Follow-up    Patient here today for a follow up. Patient says when she eats or drinks she starts coughing, and coughs up clear phelgm. She says she has discussed this in the past and it is some what better. She takes omeprazole 40 mg Q am and famotidine 40 mg at bedtime.   HPI:   [name] [name] is a 82 y.o. female with past medical history of anxiety, asthma, A flutter, COPD, depression, HTN, HLD, OSA.     Patient presenting today for coughing/GERD   Last seen June 2024, doing omeprazole daily x2 weeks, having more phlegm in her throat again. Managing diet better. Some acid reflux on occasion. Still with coughing with certain foods. No dysphagia. Some nausea as well. Hgb 9.9 in May  with mild anemia for the past 4+ years, (10-11 range)  Recommended to consider MBS, continue omeprazole 40mg , famotidine 20mg  at bedtime, obtain labs from PCP, good reflux precautions   Present: She is still coughing some when she eats or drink but this has improved from what it was previously. She is not having any dysphagia. She is interested in having a swallow study with SLP as discussed last time but does not want to do this right now as she has a lot going on. She feels Omeprazole 40mg  daily and famotidine 20mg  at bedtime PRN keep her GERD symptoms pretty well controlled. Tries to eat slow which also seems to help. Denies abdominal pain, nausea, vomiting.    She states iron pills started by her PCP in may were stopped as her prescription ran out, she has not had repeat blood work done yet. No rectal bleeding or melena. Denies changes in appetite, weight is stable. No early satiety.   Last colonoscopy: 06/09/12 Examination performed to cecum. Few small diverticula at sigmoid colon. Small external hemorrhoids. Last Endoscopy:never    Recommendations:    Past Medical History:  Diagnosis Date   Allergic rhinitis    Anxiety    Asthma    Atrial flutter (HCC)    COPD (chronic obstructive pulmonary disease) (HCC)    Depression    Essential hypertension    Hyperlipidemia    Lumbar disc disease    Obesity    OSA (obstructive sleep apnea)    CPAP   Paroxysmal atrial fibrillation (HCC)    Element of tachycardia bradycardia syndrome   Respiratory failure (HCC)     Past Surgical History:  Procedure Laterality Date   ANTERIOR FUSION LUMBAR SPINE     ATRIAL FIBRILLATION ABLATION N/A 09/12/2020   Procedure: ATRIAL FIBRILLATION ABLATION;  Surgeon: Hillis Range, MD;  Location: [facility] INVASIVE CV LAB;  Service: Cardiovascular;  Laterality: N/A;   ATRIAL FIBRILLATION ABLATION N/A 03/18/2022   Procedure: ATRIAL FIBRILLATION ABLATION;  Surgeon: Maurice Small, MD;  Location: [facility] INVASIVE CV LAB;  Service: Cardiovascular;  Laterality: N/A;   BIOPSY  08/27/2016   Procedure: BIOPSY;  Surgeon: Malissa Hippo, MD;  Location: [facility];  Service: Endoscopy;;  FOUR GASTRIC POLYPS BIOPSIED   CARDIOVERSION N/A 03/28/2014   Procedure: CARDIOVERSION;  Surgeon: Pricilla Riffle, MD;  Location: [facility] ORS;  Service: Cardiovascular;  Laterality: N/A;   CARDIOVERSION N/A 10/22/2016   Procedure: CARDIOVERSION;  Surgeon: Quintella Reichert, MD;  Location: [facility] ENDOSCOPY;  Service: Cardiovascular;  Laterality: N/A;   CARDIOVERSION N/A 11/26/2020  Procedure: CARDIOVERSION;  Surgeon: Pricilla Riffle, MD;  Location: [facility] ENDOSCOPY;  Service: Cardiovascular;  Laterality: N/A;   CATARACT EXTRACTION     COLONOSCOPY N/A 08/04/2012   Procedure: COLONOSCOPY;  Surgeon: Malissa Hippo, MD;  Location: [facility];  Service: Endoscopy;  Laterality: N/A;  730-rescheduled to 1030 Ann notified pt   ELECTROPHYSIOLOGIC STUDY N/A 09/18/2014   Procedure: Atrial Fibrillation Ablation;  Surgeon: Hillis Range, MD;  Location: [facility] INVASIVE CV LAB;  Service: Cardiovascular;   Laterality: N/A;   ESOPHAGOGASTRODUODENOSCOPY N/A 08/27/2016   Procedure: ESOPHAGOGASTRODUODENOSCOPY (EGD);  Surgeon: Malissa Hippo, MD;  Location: [facility];  Service: Endoscopy;  Laterality: N/A;  1200   implantable loop recorder placement  11/22/2020   Medtronic Reveal Linq model LNQ 11 (SN [id number]) implantable loop recorder   LASIK     Both eyes   TEE WITHOUT CARDIOVERSION N/A 09/18/2014   Procedure: TRANSESOPHAGEAL ECHOCARDIOGRAM (TEE);  Surgeon: Laurey Morale, MD;  Location: [facility] ENDOSCOPY;  Service: Cardiovascular;  Laterality: N/A;   TEE WITHOUT CARDIOVERSION N/A 10/22/2016   Procedure: TRANSESOPHAGEAL ECHOCARDIOGRAM (TEE);  Surgeon: Quintella Reichert, MD;  Location: [facility] ENDOSCOPY;  Service: Cardiovascular;  Laterality: N/A;   TEE WITHOUT CARDIOVERSION N/A 08/24/2017   Procedure: TRANSESOPHAGEAL ECHOCARDIOGRAM (TEE);  Surgeon: Wendall Stade, MD;  Location: [facility] ENDOSCOPY;  Service: Cardiovascular;  Laterality: N/A;   TOTAL HIP ARTHROPLASTY  03/30/2008   Right    Current Outpatient Medications  Medication Sig Dispense Refill   antiseptic oral rinse (BIOTENE) LIQD 15 mLs by Mouth Rinse route daily as needed for dry mouth.     atorvastatin (LIPITOR) 10 MG tablet Take 10 mg by mouth daily at 6 PM.     azelastine (ASTELIN) 0.1 % nasal spray Place 2 sprays into both nostrils 2 (two) times daily. Use in each nostril as directed (Patient taking differently: Place 2 sprays into both nostrils daily. Use in each nostril as directed) 30 mL 11   benzonatate (TESSALON) 200 MG capsule Take 1 capsule (200 mg total) by mouth 3 (three) times daily as needed for cough. 30 capsule 3   Biotin 1 MG CAPS Take 1,000 mg by mouth 3 (three) times a week.      budesonide (PULMICORT) 0.5 MG/2ML nebulizer solution Take 0.5 mg by nebulization daily.     Calcium Carbonate-Vit D-Min (QC CALCIUM-MAGNESIUM-ZINC-D3) 333.4-133 MG-UNIT TABS Take 1 tablet by mouth 2 (two) times daily.      cetirizine (ZYRTEC) 10  MG tablet Take 10 mg by mouth at bedtime as needed for allergies.     clindamycin (CLEOCIN) 300 MG capsule Take 600 mg by mouth See admin instructions. Take prior to dental procedures  0   diltiazem (CARDIZEM CD) 120 MG 24 hr capsule Take 1 capsule (120 mg total) by mouth at bedtime. 90 capsule 1   diltiazem (CARDIZEM CD) 240 MG 24 hr capsule Take 1 capsule (240 mg total) by mouth every morning. 90 capsule 0   diltiazem (CARDIZEM) 30 MG tablet TAKE 1 TABLET BY MOUTH AS NEEDED FOR PALPITATIONS 45 tablet 2   dofetilide (TIKOSYN) 500 MCG capsule Take 1 capsule (500 mcg total) by mouth 2 (two) times daily. 180 capsule 1   famotidine (PEPCID) 20 MG tablet Take 2 tablets (40 mg total) by mouth at bedtime as needed. 30 tablet 1   fluticasone (FLONASE) 50 MCG/ACT nasal spray Place 1 spray into both nostrils 2 (two) times daily.     gabapentin (NEURONTIN) 100 MG capsule Take  1 capsule (100 mg total) by mouth at bedtime. 90 capsule 0   hydrALAZINE (APRESOLINE) 100 MG tablet Take 100 mg by mouth 2 (two) times daily.     HYDROcodone bit-homatropine (HYCODAN) 5-1.5 MG/5ML syrup Take 5 mLs by mouth every 6 (six) hours as needed for cough.     Hypertonic Nasal Wash (SINUS RINSE) PACK Place 1 each into the nose daily as needed (clear nasal passage). Neilmed sinus rinse     ipratropium-albuterol (DUONEB) 0.5-2.5 (3) MG/3ML SOLN Take 3 mLs by nebulization daily.     lactobacillus acidophilus (BACID) TABS tablet Take 1 tablet by mouth daily.     losartan (COZAAR) 100 MG tablet TAKE 1 TABLET BY MOUTH DAILY 90 tablet 1   Magnesium 250 MG TABS Take 250 mg by mouth daily.     montelukast (SINGULAIR) 10 MG tablet Take 10 mg by mouth at bedtime.     omeprazole (PRILOSEC) 40 MG capsule Take 1 capsule (40 mg total) by mouth daily. Once daily 90 capsule 3   Polyethyl Glyc-Propyl Glyc PF (SYSTANE ULTRA PF) 0.4-0.3 % SOLN Place 1 drop into both eyes as needed (dry eyes).     Polyethyl Glycol-Propyl Glycol (SYSTANE) 0.4-0.3 %  GEL ophthalmic gel Place 1 Application into both eyes as needed (dry eyes).     potassium chloride SA (KLOR-CON M) 20 MEQ tablet TAKE 1 TABLET BY MOUTH DAILY 90 tablet 3   rivaroxaban (XARELTO) 20 MG TABS tablet Take 1 tablet (20 mg total) by mouth daily with supper. 90 tablet 3   umeclidinium-vilanterol (ANORO ELLIPTA) 62.5-25 MCG/ACT AEPB INHALE ONE PUFF INTO THE LUNGS DAILY 180 each 3   Wheat Dextrin (BENEFIBER ON THE GO) POWD Take 10 mLs by mouth daily.     White Petrolatum-Mineral Oil (SYSTANE NIGHTTIME) OINT Place 1 drop into both eyes at bedtime.     Current Facility-Administered Medications  Medication Dose Route Frequency Provider Last Rate Last Admin   methylPREDNISolone acetate (DEPO-MEDROL) injection 80 mg  80 mg Intramuscular Once Jetty Duhamel D, MD        Allergies as of 12/29/2022 - Review Complete 12/29/2022  Allergen Reaction Noted   Bupropion hcl Other (See Comments)    Escitalopram oxalate Other (See Comments)    Fluticasone-salmeterol Other (See Comments)    Lisinopril Cough    Serevent Other (See Comments) 05/06/2011    Family History  Problem Relation Age of Onset   Cancer Mother    Heart attack Father    Colon cancer Other     Social History   Socioeconomic History   Marital status: Single    Spouse name: Not on file   Number of children: Not on file   Years of education: Not on file   Highest education level: Not on file  Occupational History   Occupation: Retired: Engineer, site  Tobacco Use   Smoking status: Never    Passive exposure: Past   Smokeless tobacco: Never  Vaping Use   Vaping status: Never Used  Substance and Sexual Activity   Alcohol use: No    Alcohol/week: 0.0 standard drinks of alcohol   Drug use: No   Sexual activity: Not on file  Other Topics Concern   Not on file  Social History Narrative   Pt lives in [place] alone. She was never married.   Retired Psychologist, occupational.   Attends [employer]   Social  Determinants of Health   Financial Resource Strain: Not on file  Food Insecurity:  Not on file  Transportation Needs: Not on file  Physical Activity: Not on file  Stress: Not on file  Social Connections: Not on file    Review of systems General: negative for malaise, night sweats, fever, chills, weight loss Neck: Negative for lumps, goiter, pain and significant neck swelling Resp: Negative for cough, wheezing, dyspnea at rest CV: Negative for chest pain, leg swelling, palpitations, orthopnea GI: denies melena, hematochezia, nausea, vomiting, diarrhea, constipation, dysphagia, odyonophagia, early satiety or unintentional weight loss. +coughing with eating  MSK: Negative for joint pain or swelling, back pain, and muscle pain. Derm: Negative for itching or rash Psych: Denies depression, anxiety, memory loss, confusion. No homicidal or suicidal ideation.  Heme: Negative for prolonged bleeding, bruising easily, and swollen nodes. Endocrine: Negative for cold or heat intolerance, polyuria, polydipsia and goiter. Neuro: negative for tremor, gait imbalance, syncope and seizures. The remainder of the review of systems is noncontributory.  Physical Exam: BP (!) 145/72 (BP Location: Left Arm, Patient Position: Sitting, Cuff Size: Normal)   Pulse 64   Temp (!) 97.1 F (36.2 C) (Temporal)   Ht 5' 1.5" (1.562 m)   Wt 193 lb 4.8 oz (87.7 kg)   BMI 35.93 kg/m  General:   Alert and oriented. No distress noted. Pleasant and cooperative.  Head:  Normocephalic and atraumatic. Eyes:  Conjuctiva clear without scleral icterus. Mouth:  Oral mucosa pink and moist. Good dentition. No lesions. Heart: Normal rate and rhythm, s1 and s2 heart sounds present.  Lungs: Clear lung sounds in all lobes. Respirations equal and unlabored. Abdomen:  +BS, soft, non-tender and non-distended. No rebound or guarding. No HSM or masses noted. Derm: No palmar erythema or jaundice Msk:  Symmetrical without gross  deformities. Normal posture. Extremities:  Without edema. Neurologic:  Alert and  oriented x4 Psych:  Alert and cooperative. Normal mood and affect.  Invalid input(s): "6 MONTHS"   ASSESSMENT: [name] [name] is a 82 y.o. female presenting today for follow up of GERD/Cough and anemia  GERD/Coughing: ongoing coughing with consumption of some foods over the past year or so, thought to be secondary possibly to GERD or combination of GERD and sinus problems.  discussed EGD in the past though given her ongoing A-fib and need for repeat ablations this has been on hold.  She is hesitant to undergo any procedures given her cardiac issues, I did discuss modified barium swallow study to evaluate for other causes of her symptoms and rule out any aspiration, at her last visit which she did not wish to pursue at that time, however, she tells me today she is interested in doing this once things settle down as she has a lot going on right now. GERD itself seems well controlled with PPI daily and H2B PRN, she denies any dysphagia or odynophagia, is trying to eat slower and be mindful of her swallowing. She will let me know when she wishes to proceed with MBSS  Anemia: has had mild anemia 10-11 range for the past 4+ years per chart review. Hgb 9.9 in may, started on PO iron by PCP which she has now stopped after taking 3 month course. Denies rectal bleeding, melena, early satiety, weight loss, fatigue, dizziness, sob. Etiology of anemia is unclear. Will repeat H&H and check iron studies for further evaluation.   Last colonoscopy in 2014, we have discussed repeat colonoscopy to which patient has declined. She does not wish to undergo any further screening colonoscopies at this time.   PLAN:  Continue with omeprazole 40mg   2. Continue famotidine 20mg  at bedtime PRN  3. H&h, Iron studies 4. Consider MBSS with SLP, pt to let me know if she wishes to proceed with this.   All questions were answered, patient  verbalized understanding and is in agreement with plan as outlined above.   Follow Up: 3 months   Hays Dunnigan L. Jeanmarie Hubert, MSN, APRN, AGNP-C Adult-Gerontology Nurse Practitioner [facility]  I have reviewed the note and agree with the APP's assessment as described in this progress note  If low iron studies, will proceed with EGD/colonoscopy  Katrinka Blazing, MD Gastroenterology and Hepatology [facility] Gastroenter[facility]

## 2022-12-29 NOTE — Patient Instructions (Addendum)
We will continue with omeprazole 40mg  daily and famotidine 20mg  as needed in the evenings Continue with good reflux precautions We will check blood counts and iron levels Let me know if you wish to proceed with swallow study   Follow up 3 months  It was a pleasure to see you today. I want to create trusting relationships with patients and provide genuine, compassionate, and quality care. I truly value your feedback! please be on the lookout for a survey regarding your visit with me today. I appreciate your input about our visit and your time in completing this!    Subrena Devereux L. Jeanmarie Hubert, MSN, APRN, AGNP-C Adult-Gerontology Nurse Practitioner [facility] at [facility]

## 2022-12-29 NOTE — Addendum Note (Signed)
Addended by: Doylene Bode L on: 12/29/2022 11:15 AM   Modules accepted: Orders

## 2022-12-30 DIAGNOSIS — I700 Atherosclerosis of aorta: Secondary | ICD-10-CM | POA: Diagnosis not present

## 2022-12-30 NOTE — Progress Notes (Signed)
HPI female never smoker followed for OSA, allergic rhinitis, asthma, Hyper IgE,  complicated by A. Fib/Coumadin, HBP, GERD, obesity NPSG prior to EMR in 2012 Office Spirometry 09/02/16-WNL. FVC 2.06/76%, FEV1 1.57/77%, ratio 0.76, FEF 25-75% 1.26/80% Allergy labs 09/02/16- eosinophils 200, total IgE 4230 causing elevation of all specific allergen antibody levels including Aspergillus. Nucala  + 2018 PFT-10/13/16-minimal obstruction without response to dilator, minimal reduction of diffusion. FVC 2.02/77%, FEV1 1.62/83%, ratio 0.80, TLC 92%, DLCO 77% CT soft tissue neck 09/22/16-1. 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  Subsegmental atelectasis and nonspecific linear patchy densities in the lungs as described. They have a benign appearance.   Small hyperdense masses in the mediastinum are stable compared with 2015 supporting benign etiology such as ectopic thyroid tissue.  Three-vessel prominent coronary artery calcification.  Bibasilar bronchiolectasis. Labs 09/20/2017-WBC 14,900, hemoglobin 11.2, eosinophils absent, lymphocytes low( ?steroids or viral?), BNP 220, CMET wnl IgE 04/12/2018- 2,574, 10/16/16- 4,302, 11/09/13- 3,513 Office Spirometry 04/12/2018-mild restriction of exhaled volume.  Possible mild obstructive airways.  FVC 1.9/73%, FEV1 1.4/75%, ratio 1.76, FEF 25-75% 1.2/80%  -------------------------------------------------------------------------------------------  08/18/22-  82 year old female never smoker followed for OSA, allergic Rhinitis, Asthma/ Nucala, Hyper IgE, complicated by PAFibXarelto/ Pacemaker, HBP, GERD, obesity, Anemia,  -Neb Pulmicort/  Duoneb   Anoro, Flonase, Zyrtec, Singulair, Nucala,  CPAP 5-15/Adapt      AirSense 11 AutoSet Download- compliance 97%, AHI 2.6/ hr Body weight today- 193 lbs -----Patient states she is doing pretty well  She is comfortable with her new CPAP machine, replaced a month ago.   Download reviewed with her. She has a transient cough productive of scant yellow sputum lasting for a little bit in the morning right after she gets up.  After that her chest is clear through the day and breathing has been comfortable. Being limited by pain in the right hip and right leg for which she is getting PT.  She put pulmonary rehabilitation on hold because of this.  She plans to get a new mattress.  I asked her to report this problem to her primary physician so that she can be referred appropriately if needed.  12/31/22- 82 year old female never smoker followed for OSA, allergic Rhinitis, Asthma/ Nucala, Hyper IgE, complicated by PAFibXarelto/ Pacemaker, HBP, GERD, obesity, Anemia,  -Neb Pulmicort/  Duoneb   Anoro, Flonase, Zyrtec, Singulair, Nucala,  CPAP 5-15/Adapt      AirSense 11 AutoSet Download- compliance 100%, AHI 1.6/hr Body weight today- 192 lbs Download reviewed.  She went back to using an old machine that she prefers but it is working fine for now.  She will discuss with her homecare company. Physical therapy has been a big help. Has had episodes of nasal stuffiness and head congestion she refers to as "allergy attacks" recently without specific trigger identified.  Had COVID faction marked by sinusitis and also, she says strep throat. Using her incentive spirometry.  During COVID she used her nebulizer twice a day but not needing it now.   ` ROS-see HPI   + = positive Constitutional:   No-   weight loss, night sweats, fevers, chills, fatigue, lassitude. HEENT:   No-  headaches, difficulty swallowing, tooth/dental problems, sore throat,       No-  sneezing, itching,  ear ache,  +nasal congestion, post nasal drip,  CV:  No-   chest pain, orthopnea, PND, swelling in lower extremities, anasarca, dizziness, palpitations Resp: +  shortness of breath with exertion or at rest.                +  productive cough,  + non-productive cough,  No- coughing up of blood.               No-change in color of mucus. + wheezing.   Skin: No-   rash or lesions. GI:  No-   heartburn, indigestion, abdominal pain, nausea, vomiting, GU:  MS:  No-   joint pain or swelling. . Neuro-     nothing unusual Psych:  No- change in mood or affect. No depression or anxiety.  No memory loss.  OBJ- Physical Exam General- Alert, Oriented, Affect-appropriate, Distress- none acute, + obese Skin- rash-none, lesions- none, excoriation- none Lymphadenopathy- none Head- atraumatic            Eyes- Gross vision intact, PERRLA, conjunctivae and secretions clear            Ears- Hearing, canals-normal            Nose- No turbinate edema, no-Septal dev, mucus, polyps, erosion, perforation             Throat- Mallampati III-IV , mucosa- not red,  drainage- none, tonsils- atrophic Neck- flexible , trachea midline, no stridor , thyroid nl, carotid no bruit Chest - symmetrical excursion , unlabored           Heart/CV- RR today/ hx AFib(no pacemaker) , no murmur , no gallop, no rub, nl  s1 s2                  - JVD- none , edema- none, stasis changes- none, varices- none           Lung- + clear  Wheeze-none , cough-none,                         dullness-none, rub- none           Chest wall-  Abd- Br/ Gen/ Rectal- Not done, not indicated Extrem- cyanosis- none, clubbing, none, atrophy- none, strength- nl Neuro- grossly intact to observation

## 2022-12-31 ENCOUNTER — Encounter: Payer: Medicare Other | Attending: Internal Medicine | Admitting: *Deleted

## 2022-12-31 ENCOUNTER — Ambulatory Visit: Payer: Medicare Other | Admitting: Internal Medicine

## 2022-12-31 ENCOUNTER — Encounter: Payer: Self-pay | Admitting: Internal Medicine

## 2022-12-31 VITALS — BP 154/59 | HR 61 | Temp 97.8°F

## 2022-12-31 VITALS — BP 161/70 | HR 64 | Ht 61.5 in | Wt 192.5 lb

## 2022-12-31 DIAGNOSIS — G4733 Obstructive sleep apnea (adult) (pediatric): Secondary | ICD-10-CM | POA: Diagnosis not present

## 2022-12-31 DIAGNOSIS — J4520 Mild intermittent asthma, uncomplicated: Secondary | ICD-10-CM | POA: Diagnosis not present

## 2022-12-31 DIAGNOSIS — J302 Other seasonal allergic rhinitis: Secondary | ICD-10-CM

## 2022-12-31 DIAGNOSIS — J3089 Other allergic rhinitis: Secondary | ICD-10-CM

## 2022-12-31 DIAGNOSIS — J4521 Mild intermittent asthma with (acute) exacerbation: Secondary | ICD-10-CM

## 2022-12-31 DIAGNOSIS — Z23 Encounter for immunization: Secondary | ICD-10-CM

## 2022-12-31 MED ORDER — MEPOLIZUMAB 100 MG ~~LOC~~ SOLR
100.0000 mg | Freq: Once | SUBCUTANEOUS | Status: AC
  Administered 2022-12-31: 100 mg via SUBCUTANEOUS

## 2022-12-31 NOTE — Progress Notes (Signed)
Carelink Summary Report / Loop Recorder 

## 2022-12-31 NOTE — Patient Instructions (Signed)
We can continue CPAP and current meds  Order- flu vax senior

## 2022-12-31 NOTE — Progress Notes (Signed)
Diagnosis: Asthma  Provider:   Jetty Duhamel, MD  Procedure: Injection  Nucala (Mepolizumab), Dose: 100 mg, Site: subcutaneous, Number of injections: 1  Post Care: Observation period completed  Discharge: Condition: Good, Destination: Home . AVS Provided  Performed by:  Daleen Squibb, RN

## 2023-01-04 DIAGNOSIS — C4441 Basal cell carcinoma of skin of scalp and neck: Secondary | ICD-10-CM | POA: Diagnosis not present

## 2023-01-06 NOTE — Assessment & Plan Note (Signed)
Benefits from CPAP with good compliance and control Plan- continue auto 5-15 

## 2023-01-06 NOTE — Assessment & Plan Note (Signed)
Seasonal flare rhinitis I think she is managing okay with nasal steroid and antihistamine. Plan-flu vaccine today.

## 2023-01-07 ENCOUNTER — Ambulatory Visit (HOSPITAL_COMMUNITY)
Admission: RE | Admit: 2023-01-07 | Discharge: 2023-01-07 | Disposition: A | Discharge status: Home / Self Care | Payer: Medicare Other | Source: Ambulatory Visit | Attending: Internal Medicine | Admitting: Internal Medicine

## 2023-01-07 VITALS — BP 158/68 | HR 61 | Ht 61.5 in | Wt 196.0 lb

## 2023-01-07 DIAGNOSIS — Z79899 Other long term (current) drug therapy: Secondary | ICD-10-CM | POA: Diagnosis not present

## 2023-01-07 DIAGNOSIS — I4819 Other persistent atrial fibrillation: Secondary | ICD-10-CM

## 2023-01-07 DIAGNOSIS — Z7901 Long term (current) use of anticoagulants: Secondary | ICD-10-CM | POA: Insufficient documentation

## 2023-01-07 DIAGNOSIS — I10 Essential (primary) hypertension: Secondary | ICD-10-CM | POA: Diagnosis not present

## 2023-01-07 DIAGNOSIS — I4891 Unspecified atrial fibrillation: Secondary | ICD-10-CM | POA: Insufficient documentation

## 2023-01-07 LAB — BASIC METABOLIC PANEL
Anion gap: 8 (ref 5–15)
BUN: 17 mg/dL (ref 8–23)
CO2: 24 mmol/L (ref 22–32)
Calcium: 9.3 mg/dL (ref 8.9–10.3)
Chloride: 105 mmol/L (ref 98–111)
Creatinine, Ser: 1.1 mg/dL — ABNORMAL HIGH (ref 0.44–1.00)
GFR, Estimated: 50 mL/min — ABNORMAL LOW (ref >60)
Glucose, Bld: 95 mg/dL (ref 70–99)
Potassium: 4.1 mmol/L (ref 3.5–5.1)
Sodium: 137 mmol/L (ref 135–145)

## 2023-01-07 LAB — MAGNESIUM: Magnesium: 2.1 mg/dL (ref 1.7–2.4)

## 2023-01-07 NOTE — Progress Notes (Signed)
Primary Care Physician: Kirstie Peri, MD Referring Physician: Dr. Hendricks Milo is a 82 y.o. female with a h/o PPM,  afib, s/p afib ablation 2022 and again on 03/19/23,  Tikosyn for further arrhythmia management. She is here for f/u ablation today.  She saw Dr. Johney Frame in early June at which time she was in SR and her afib burden was 2% but he felt  her device may have been underdetecting  and changed her AF setting to AT/AF and sensitivity from least to less.  Today , her ekg shows SR today  and she states that she has felt overall felt well. Occasionally she will still have episodes  of fluttering and not feel well, but she feels improved from a heart rhythm standpoint  from a year ago. She is now going to pulmonary rehab and physical therapy several times a week and seeing improvement   F/u in afib, clinic,  04/15/22,  as pt had noticed increased afib burden and fatigue last summer. She was referred to Dr. Nelly Laurence and he took her back for a repeat ablation 03/19/23. Today, EKG shows sinus rhythm with very flat p waves. She continues on dofetilide 500 mcg bid.  Qtc stable. She is still c/o fatigue and sleeping a lot. She has started back on maintenance pulmonary rehab. Review of ILR still  shows some daily episodes of afib .  No swallowing or groin issus but she does have reflux problems and is due for an upper endoscopy but will have to wait to interrupt anticoagulation until after  4/5. (end of 3 month healing period post ablation )   F/u in Afib clinic, 01/07/23. She is currently in NSR. Review of most recent ILR on 9/23 showed increased Afib burden 28.9% of the time. She currently takes Tikosyn 500 mcg BID. No missed doses of Xarelto.   Today, she denies symptoms of palpitations, chest pain, shortness of breath, orthopnea, PND, lower extremity edema, dizziness, presyncope, syncope, or neurologic sequela. The patient is tolerating medications without difficulties and is otherwise  without complaint today.   Past Medical History:  Diagnosis Date   Allergic rhinitis    Anxiety    Asthma    Atrial flutter (HCC)    COPD (chronic obstructive pulmonary disease) (HCC)    Depression    Essential hypertension    Hyperlipidemia    Lumbar disc disease    Obesity    OSA (obstructive sleep apnea)    CPAP   Paroxysmal atrial fibrillation (HCC)    Element of tachycardia bradycardia syndrome   Respiratory failure (HCC)    Past Surgical History:  Procedure Laterality Date   ANTERIOR FUSION LUMBAR SPINE     ATRIAL FIBRILLATION ABLATION N/A 09/12/2020   Procedure: ATRIAL FIBRILLATION ABLATION;  Surgeon: Hillis Range, MD;  Location: [facility] IN[facility]ASIVE CV LAB;  Service: Cardiovascular;  Laterality: N/A;   ATRIAL FIBRILLATION ABLATION N/A 03/18/2022   Procedure: ATRIAL FIBRILLATION ABLATION;  Surgeon: Maurice Small, MD;  Location: [facility] INVASIVE CV LAB;  Service: Cardiovascular;  Laterality: N/A;   BIOPSY  08/27/2016   Procedure: BIOPSY;  Surgeon: Malissa Hippo, MD;  Location: [facility]O [facility];  Service: Endoscopy;;  FOUR GASTRIC POLYPS BIOPSIED   CARDIOVERSION N/A 03/28/2014   Procedure: CARDIOVERSION;  Surgeon: Pricilla Riffle, MD;  Location: [facility] ORS;  Service: Cardiovascular;  Laterality: N/A;   CARDIOVERSION N/A 10/22/2016   Procedure: CARDIOVERSION;  Surgeon: Quintella Reichert, MD;  Location: [facility] ENDOSCOPY;  Service:  Cardiovascular;  Laterality: N/A;   CARDIOVERSION N/A 11/26/2020   Procedure: CARDIOVERSION;  Surgeon: Pricilla Riffle, MD;  Location: [facility] ENDOSCOPY;  Service: Cardiovascular;  Laterality: N/A;   CATARACT EXTRACTION     COLONOSCOPY N/A 08/04/2012   Procedure: COLONOSCOPY;  Surgeon: Malissa Hippo, MD;  Location: [facility];  Service: Endoscopy;  Laterality: N/A;  730-rescheduled to 1030 Ann notified pt   ELECTROPHYSIOLOGIC STUDY N/A 09/18/2014   Procedure: Atrial Fibrillation Ablation;  Surgeon: Hillis Range, MD;  Location: [name] INVASIVE CV LAB;  Service: Cardiovascular;   Laterality: N/A;   ESOPHAGOGASTRODUODENOSCOPY N/A 08/27/2016   Procedure: ESOPHAGOGASTRODUODENOSCOPY (EGD);  Surgeon: Malissa Hippo, MD;  Location: [facility];  Service: Endoscopy;  Laterality: N/A;  1200   implantable loop recorder placement  11/22/2020   Medtronic Reveal Linq model LNQ 11 (SN [id number]) implantable loop recorder   LASIK     Both eyes   TEE WITHOUT CARDIOVERSION N/A 09/18/2014   Procedure: TRANSESOPHAGEAL ECHOCARDIOGRAM (TEE);  Surgeon: Laurey Morale, MD;  Location: [facility] ENDOSCOPY;  Service: Cardiovascular;  Laterality: N/A;   TEE WITHOUT CARDIOVERSION N/A 10/22/2016   Procedure: TRANSESOPHAGEAL ECHOCARDIOGRAM (TEE);  Surgeon: Quintella Reichert, MD;  Location: [facility] ENDOSCOPY;  Service: Cardiovascular;  Laterality: N/A;   TEE WITHOUT CARDIOVERSION N/A 08/24/2017   Procedure: TRANSESOPHAGEAL ECHOCARDIOGRAM (TEE);  Surgeon: Wendall Stade, MD;  Location: [facility] ENDOSCOPY;  Service: Cardiovascular;  Laterality: N/A;   TOTAL HIP ARTHROPLASTY  03/30/2008   Right    Current Outpatient Medications  Medication Sig Dispense Refill   antiseptic oral rinse (BIOTENE) LIQD 15 mLs by Mouth Rinse route daily as needed for dry mouth.     atorvastatin (LIPITOR) 10 MG tablet Take 10 mg by mouth daily at 6 PM.     azelastine (ASTELIN) 0.1 % nasal spray Place 2 sprays into both nostrils 2 (two) times daily. Use in each nostril as directed (Patient taking differently: Place 2 sprays into both nostrils daily. Use in each nostril as directed) 30 mL 11   benzonatate (TESSALON) 200 MG capsule Take 1 capsule (200 mg total) by mouth 3 (three) times daily as needed for cough. 30 capsule 3   Biotin 1 MG CAPS Take 1,000 mg by mouth 3 (three) times a week.      budesonide (PULMICORT) 0.5 MG/2ML nebulizer solution Take 0.5 mg by nebulization daily.     Calcium Carbonate-Vit D-Min (QC CALCIUM-MAGNESIUM-ZINC-D3) 333.4-133 MG-UNIT TABS Take 1 tablet by mouth 2 (two) times daily.      cetirizine (ZYRTEC) 10  MG tablet Take 10 mg by mouth at bedtime as needed for allergies.     clindamycin (CLEOCIN) 300 MG capsule Take 600 mg by mouth See admin instructions. Take prior to dental procedures  0   diltiazem (CARDIZEM CD) 120 MG 24 hr capsule Take 1 capsule (120 mg total) by mouth at bedtime. 90 capsule 1   diltiazem (CARDIZEM CD) 240 MG 24 hr capsule Take 1 capsule (240 mg total) by mouth every morning. 90 capsule 0   diltiazem (CARDIZEM) 30 MG tablet TAKE 1 TABLET BY MOUTH AS NEEDED FOR PALPITATIONS 45 tablet 2   dofetilide (TIKOSYN) 500 MCG capsule Take 1 capsule (500 mcg total) by mouth 2 (two) times daily. 180 capsule 1   famotidine (PEPCID) 20 MG tablet Take 2 tablets (40 mg total) by mouth at bedtime as needed. 30 tablet 1   fluticasone (FLONASE) 50 MCG/ACT nasal spray Place 1 spray into both nostrils 2 (two) times  daily.     gabapentin (NEURONTIN) 100 MG capsule Take 1 capsule (100 mg total) by mouth at bedtime. 90 capsule 0   hydrALAZINE (APRESOLINE) 100 MG tablet Take 100 mg by mouth 2 (two) times daily.     HYDROcodone bit-homatropine (HYCODAN) 5-1.5 MG/5ML syrup Take 5 mLs by mouth every 6 (six) hours as needed for cough.     Hypertonic Nasal Wash (SINUS RINSE) PACK Place 1 each into the nose daily as needed (clear nasal passage). Neilmed sinus rinse     ipratropium-albuterol (DUONEB) 0.5-2.5 (3) MG/3ML SOLN Take 3 mLs by nebulization daily.     lactobacillus acidophilus (BACID) TABS tablet Take 1 tablet by mouth daily.     losartan (COZAAR) 100 MG tablet TAKE 1 TABLET BY MOUTH DAILY 90 tablet 1   Magnesium 250 MG TABS Take 250 mg by mouth daily.     montelukast (SINGULAIR) 10 MG tablet Take 10 mg by mouth at bedtime.     omeprazole (PRILOSEC) 40 MG capsule Take 1 capsule (40 mg total) by mouth daily. Once daily 90 capsule 3   ondansetron (ZOFRAN-ODT) 4 MG disintegrating tablet Take 4 mg by mouth as needed.     Polyethyl Glyc-Propyl Glyc PF (SYSTANE ULTRA PF) 0.4-0.3 % SOLN Place 1 drop into  both eyes as needed (dry eyes).     Polyethyl Glycol-Propyl Glycol (SYSTANE) 0.4-0.3 % GEL ophthalmic gel Place 1 Application into both eyes as needed (dry eyes).     potassium chloride SA (KLOR-CON M) 20 MEQ tablet TAKE 1 TABLET BY MOUTH DAILY 90 tablet 3   rivaroxaban (XARELTO) 20 MG TABS tablet Take 1 tablet (20 mg total) by mouth daily with supper. 90 tablet 3   umeclidinium-vilanterol (ANORO ELLIPTA) 62.5-25 MCG/ACT AEPB INHALE ONE PUFF INTO THE LUNGS DAILY 180 each 3   Wheat Dextrin (BENEFIBER ON THE GO) POWD Take 10 mLs by mouth daily.     White Petrolatum-Mineral Oil (SYSTANE NIGHTTIME) OINT Place 1 drop into both eyes at bedtime.     Current Facility-Administered Medications  Medication Dose Route Frequency Provider Last Rate Last Admin   methylPREDNISolone acetate (DEPO-MEDROL) injection 80 mg  80 mg Intramuscular Once Jetty Duhamel D, MD        Allergies  Allergen Reactions   Bupropion Hcl Other (See Comments)    Suicidal Thoughts.    Escitalopram Oxalate Other (See Comments)    Suicidal Thoughts.    Fluticasone-Salmeterol Other (See Comments)    Advair - Caused patient to go into Afib.    Lisinopril Cough   Serevent Other (See Comments)    Caused patient to go into Afib   ROS- All systems are reviewed and negative except as per the HPI above  Physical Exam: Vitals:   01/07/23 1113  BP: (!) 158/68  Pulse: 61  Weight: 88.9 kg  Height: 5' 1.5" (1.562 m)    Wt Readings from Last 3 Encounters:  01/07/23 88.9 kg  12/31/22 87.3 kg  12/29/22 87.7 kg    Labs: Lab Results  Component Value Date   NA 137 01/07/2023   K 4.1 01/07/2023   CL 105 01/07/2023   CO2 24 01/07/2023   GLUCOSE 95 01/07/2023   BUN 17 01/07/2023   CREATININE 1.10 (H) 01/07/2023   CALCIUM 9.3 01/07/2023   MG 2.1 01/07/2023   Lab Results  Component Value Date   INR 2.0 (H) 09/19/2019   No results found for: "CHOL", "HDL", "LDLCALC", "TRIG"  GEN- The patient is well appearing,  alert and  oriented x 3 today.   Neck - no JVD or carotid bruit noted Lungs- Clear to ausculation bilaterally, normal work of breathing Heart- Regular rate and rhythm, no murmurs, rubs or gallops, PMI not laterally displaced Extremities- no clubbing, cyanosis, or edema Skin - no rash or ecchymosis noted   EKG- Vent. rate 61 BPM PR interval 144 ms QRS duration 120 ms QT/QTcB 490/493 ms P-R-T axes * -55 67 Normal sinus rhythm Left axis deviation Minimal voltage criteria for LVH, may be normal variant ( Cornell product ) Anterior infarct , age undetermined Abnormal ECG When compared with ECG of 15-Apr-2022 13:43, PREVIOUS ECG IS PRESENT  ECHO 09/20/19:  1. Left ventricular ejection fraction, by estimation, is 60 to 65%. The  left ventricle has normal function. The left ventricle has no regional  wall motion abnormalities. There is moderate left ventricular hypertrophy.  Left ventricular diastolic  parameters are consistent with Grade II diastolic dysfunction  (pseudonormalization). Elevated left atrial pressure.   2. Right ventricular systolic function is normal. The right ventricular  size is normal. There is mildly elevated pulmonary artery systolic  pressure.   3. Left atrial size was severely dilated.   4. The mitral valve is normal in structure. Trivial mitral valve  regurgitation. No evidence of mitral stenosis.   5. The aortic valve is tricuspid. Aortic valve regurgitation is not  visualized. No aortic stenosis is present.   6. Indeterminant PASP, inadequate TR jet.   7. The inferior vena cava is dilated in size with >50% respiratory  variability, suggesting right atrial pressure of 8 mmHg.    Assessment and Plan:  1. Afib  S/p 3rd ablation 03/18/22  She is currently in NSR.  Review of ILR shows ongoing Afib burden - April 29.5% burden, May 30% burden, July 15%, August 18%, and 28.9% burden in September. Dr. Nelly Laurence noted she may not benefit from another ablation.  Continue  current management with Tikosyn 500 mcg bid and cardizem 240 mg am and 120 mg pm  Manual interpretation of QTc stable - 444 ms.  Bmet/mag today.   2. CHA2DS2VASc  score of at least 4 Continue xarelto 20 mg daily   3. HTN  Elevated in office today and patient notes it has been trending that way at home. I advised patient to please continue to trend BP at home and f/u with Dr. Diona Browner as to possible addition to her hypertension regimen. I would be hesitant to increase her diltiazem due to HR being at 61 today.    F/u with Dr. Nelly Laurence in 6 months.    Justin Mend, PA-C Afib Clinic [facility] [address] [place], [place] [address] [phone]

## 2023-01-08 ENCOUNTER — Other Ambulatory Visit: Payer: Self-pay | Admitting: Primary Care

## 2023-01-12 ENCOUNTER — Ambulatory Visit: Payer: Medicare Other | Admitting: Cardiology

## 2023-01-12 DIAGNOSIS — M25551 Pain in right hip: Secondary | ICD-10-CM | POA: Diagnosis not present

## 2023-01-13 ENCOUNTER — Encounter: Payer: Self-pay | Admitting: Cardiology

## 2023-01-13 ENCOUNTER — Other Ambulatory Visit (INDEPENDENT_AMBULATORY_CARE_PROVIDER_SITE_OTHER): Payer: Self-pay | Admitting: Gastroenterology

## 2023-01-13 ENCOUNTER — Ambulatory Visit: Payer: Medicare Other | Attending: Cardiology | Admitting: Cardiology

## 2023-01-13 ENCOUNTER — Telehealth (INDEPENDENT_AMBULATORY_CARE_PROVIDER_SITE_OTHER): Payer: Self-pay | Admitting: *Deleted

## 2023-01-13 VITALS — BP 136/62 | HR 56 | Ht 61.5 in | Wt 192.6 lb

## 2023-01-13 DIAGNOSIS — I4819 Other persistent atrial fibrillation: Secondary | ICD-10-CM | POA: Insufficient documentation

## 2023-01-13 DIAGNOSIS — G4733 Obstructive sleep apnea (adult) (pediatric): Secondary | ICD-10-CM | POA: Insufficient documentation

## 2023-01-13 DIAGNOSIS — Z79899 Other long term (current) drug therapy: Secondary | ICD-10-CM | POA: Insufficient documentation

## 2023-01-13 DIAGNOSIS — R058 Other specified cough: Secondary | ICD-10-CM

## 2023-01-13 DIAGNOSIS — R1310 Dysphagia, unspecified: Secondary | ICD-10-CM

## 2023-01-13 DIAGNOSIS — I10 Essential (primary) hypertension: Secondary | ICD-10-CM | POA: Diagnosis not present

## 2023-01-13 MED ORDER — SPIRONOLACTONE 25 MG PO TABS: 12.5000 mg | ORAL_TABLET | Freq: Every day | ORAL | 1 refills | Status: DC

## 2023-01-13 MED ORDER — POTASSIUM CHLORIDE ER 10 MEQ PO TBCR: 10.0000 meq | EXTENDED_RELEASE_TABLET | Freq: Every day | ORAL | 1 refills | Status: DC

## 2023-01-13 NOTE — Telephone Encounter (Signed)
Patient made aware we have made a referral for this and will be expecting a call with appointment information.

## 2023-01-13 NOTE — Progress Notes (Signed)
Cardiology Office Note  Date: 01/13/2023   ID: [name] [name], DOB Aug 03, 1940, MRN [id number]  History of Present Illness: [name] [name] is an 82 y.o. female last seen in April with recent follow-up in the atrial fibrillation clinic also noted.  She is here for a routine visit.  No specific increase in palpitations.  She has had some sporadic discomfort in her upper back and left arm, nonexertional.  Feels like her stamina is better after physical therapy.  She does not describe any sudden dizziness or syncope.  ILR interrogation in September revealed approximately 29% atrial fibrillation burden.  I reviewed her medications.  Current cardiac regimen includes Cardizem CD with as needed short acting Cardizem for palpitations, Tikosyn, hydralazine, Cozaar, Xarelto, Lipitor, and potassium supplements.  Blood pressure mildly elevated today.  She states that she gets systolics in the 150s at home but that her home cuff is generally higher than what is checked in the office.  She has had elevated systolics also at recent healthcare visits.  Physical Exam: VS:  BP 136/62   Pulse (!) 56   Ht 5' 1.5" (1.562 m)   Wt 192 lb 9.6 oz (87.4 kg)   SpO2 97%   BMI 35.80 kg/m , BMI Body mass index is 35.8 kg/m.  Wt Readings from Last 3 Encounters:  01/13/23 192 lb 9.6 oz (87.4 kg)  01/07/23 196 lb (88.9 kg)  12/31/22 192 lb 8 oz (87.3 kg)    General: Patient appears comfortable at rest. HEENT: Conjunctiva and lids normal. Neck: Supple, no elevated JVP or carotid bruits. Lungs: Clear to auscultation, nonlabored breathing at rest. Cardiac: Regular rate and rhythm, no S3, 2/6 systolic murmur. Extremities: No pitting edema.  ECG:  An ECG dated 07/09/2022 was personally reviewed today and demonstrated:  Sinus bradycardia with PAC, LVH, left anterior fascicular block.  Labwork: 03/09/2022: Hemoglobin 10.9; Platelets 247 01/07/2023: BUN 17; Creatinine, Ser 1.10; Magnesium 2.1; Potassium  4.1; Sodium 137  May 2024: Cholesterol 112, triglycerides 78, HDL 41, LDL 55, TSH 2.23  Other Studies Reviewed Today:  No interval cardiac testing for review today.  Assessment and Plan:  1.  Persistent atrial fibrillation as well as atypical atrial flutter.  She underwent third atrial fibrillation ablation attempt in December 2023 with Dr. Nelly Laurence and plan is to continue with medical therapy including Tikosyn, Cardizem CD and Xarelto.  CHA2DS2-VASc score is at least 5.  Approximately 29% rhythm burden by recent ILR interrogation in September.  For now would continue with present regimen.   2.  OSA on CPAP.  He reports compliance with treatment.   3.  Primary hypertension.  Blood pressure mildly elevated today, recent isolated systolic hypertension mostly.  She is on a potassium supplement at baseline in the absence of diuretics, not specifically evaluated for hyperaldosteronism but this is a possibility.  Trying to keep electrolytes in order in light of use of Tikosyn.  Current regimen includes Cozaar, hydralazine, and Cardizem CD.  Plan to add Aldactone 12.5 mg daily.  Cut potassium supplement back to 10 mEq daily.  Check BMET in 10 days.  Might be able to discontinue potassium supplements altogether but will do this stepwise.  Continue to track blood pressure.  Disposition:  Follow up  3 months.  Signed, [name], M.D., F.A.C.C. [facility]

## 2023-01-13 NOTE — Patient Instructions (Addendum)
Medication Instructions:  Your physician has recommended you make the following change in your medication:  Start spironolactone 12.5 mg daily Decrease potassium to 10 meq daily Continue all other medication as prescribed  Labwork: BMET in 10 days around 01/25/2023 Non-fasting [employer] or [employer] ([address] [place])  Testing/Procedures: none  Follow-Up: Your physician recommends that you schedule a follow-up appointment in: 3 months  Any Other Special Instructions Will Be Listed Below (If Applicable).  If you need a refill on your cardiac medications before your next appointment, please call your pharmacy.

## 2023-01-13 NOTE — Telephone Encounter (Signed)
Pt seen 12/29/22 and your note states  Consider MBSS with SLP, pt to let me know if she wishes to proceed with this.    Pt called and states she does want to go forward with swallow study.   [phone]

## 2023-01-14 ENCOUNTER — Other Ambulatory Visit (HOSPITAL_COMMUNITY): Payer: Self-pay | Admitting: Occupational Therapy

## 2023-01-14 ENCOUNTER — Ambulatory Visit (HOSPITAL_COMMUNITY): Payer: Medicare Other

## 2023-01-14 DIAGNOSIS — R059 Cough, unspecified: Secondary | ICD-10-CM

## 2023-01-14 DIAGNOSIS — K219 Gastro-esophageal reflux disease without esophagitis: Secondary | ICD-10-CM

## 2023-01-14 NOTE — Telephone Encounter (Signed)
Referral sent, they will contact patient with apt

## 2023-01-18 DIAGNOSIS — M25572 Pain in left ankle and joints of left foot: Secondary | ICD-10-CM | POA: Diagnosis not present

## 2023-01-18 DIAGNOSIS — M62830 Muscle spasm of back: Secondary | ICD-10-CM | POA: Diagnosis not present

## 2023-01-18 DIAGNOSIS — M9903 Segmental and somatic dysfunction of lumbar region: Secondary | ICD-10-CM | POA: Diagnosis not present

## 2023-01-18 DIAGNOSIS — M25571 Pain in right ankle and joints of right foot: Secondary | ICD-10-CM | POA: Diagnosis not present

## 2023-01-18 DIAGNOSIS — M9902 Segmental and somatic dysfunction of thoracic region: Secondary | ICD-10-CM | POA: Diagnosis not present

## 2023-01-18 DIAGNOSIS — M546 Pain in thoracic spine: Secondary | ICD-10-CM | POA: Diagnosis not present

## 2023-01-18 DIAGNOSIS — M9901 Segmental and somatic dysfunction of cervical region: Secondary | ICD-10-CM | POA: Diagnosis not present

## 2023-01-20 DIAGNOSIS — M9901 Segmental and somatic dysfunction of cervical region: Secondary | ICD-10-CM | POA: Diagnosis not present

## 2023-01-20 DIAGNOSIS — M9903 Segmental and somatic dysfunction of lumbar region: Secondary | ICD-10-CM | POA: Diagnosis not present

## 2023-01-20 DIAGNOSIS — M9902 Segmental and somatic dysfunction of thoracic region: Secondary | ICD-10-CM | POA: Diagnosis not present

## 2023-01-20 DIAGNOSIS — M546 Pain in thoracic spine: Secondary | ICD-10-CM | POA: Diagnosis not present

## 2023-01-20 DIAGNOSIS — M62830 Muscle spasm of back: Secondary | ICD-10-CM | POA: Diagnosis not present

## 2023-01-20 DIAGNOSIS — M25571 Pain in right ankle and joints of right foot: Secondary | ICD-10-CM | POA: Diagnosis not present

## 2023-01-20 DIAGNOSIS — M25572 Pain in left ankle and joints of left foot: Secondary | ICD-10-CM | POA: Diagnosis not present

## 2023-01-21 ENCOUNTER — Telehealth: Payer: Self-pay | Admitting: Cardiology

## 2023-01-21 NOTE — Telephone Encounter (Signed)
Patient called stating that for the past 2 days she is having a lot of leg cramping.

## 2023-01-21 NOTE — Telephone Encounter (Signed)
Patient has been having leg cramps all week  Patient woke up with bad cramps all over her body on Monday and her legs are the worst part She states she saw Dr.McDowell last week and he stated her potassium was normal. Patient is going to try eating bananas . Patient is SOB on the phone she states she is like that all the time  Patient BP-145/82 this morning Patient weighed 187.5lb this morning. ( No Swelling)  Patient states Dr.McDowell cut her potassium in half is why she is concerned with her potassium now being low

## 2023-01-21 NOTE — Telephone Encounter (Signed)
Left patient detailed voicemail regarding this

## 2023-01-25 ENCOUNTER — Ambulatory Visit (INDEPENDENT_AMBULATORY_CARE_PROVIDER_SITE_OTHER): Payer: Medicare Other

## 2023-01-25 DIAGNOSIS — I4819 Other persistent atrial fibrillation: Secondary | ICD-10-CM

## 2023-01-25 DIAGNOSIS — E1165 Type 2 diabetes mellitus with hyperglycemia: Secondary | ICD-10-CM | POA: Diagnosis not present

## 2023-01-26 ENCOUNTER — Encounter: Payer: Self-pay | Admitting: Internal Medicine

## 2023-01-26 LAB — CUP PACEART REMOTE DEVICE CHECK
Date Time Interrogation Session: 20241027230404
Implantable Pulse Generator Implant Date: 20220826

## 2023-01-27 DIAGNOSIS — I10 Essential (primary) hypertension: Secondary | ICD-10-CM | POA: Diagnosis not present

## 2023-01-28 ENCOUNTER — Encounter: Payer: Medicare Other | Admitting: Emergency Medicine

## 2023-01-28 VITALS — BP 159/68 | HR 63 | Temp 97.5°F

## 2023-01-28 DIAGNOSIS — J4520 Mild intermittent asthma, uncomplicated: Secondary | ICD-10-CM | POA: Diagnosis not present

## 2023-01-28 DIAGNOSIS — J4521 Mild intermittent asthma with (acute) exacerbation: Secondary | ICD-10-CM

## 2023-01-28 MED ORDER — MEPOLIZUMAB 100 MG ~~LOC~~ SOLR
100.0000 mg | Freq: Once | SUBCUTANEOUS | Status: AC
  Administered 2023-01-28: 100 mg via SUBCUTANEOUS

## 2023-01-28 NOTE — Progress Notes (Signed)
Diagnosis: Asthma  Provider:  Jetty Duhamel, MD   Procedure: Injection  Nucala (Mepolizumab), Dose: 100 mg, Site: subcutaneous, Number of injections: 1 Given in right arm  Post Care: Patient declined observation  Discharge: Condition: Good, Destination: Home . AVS Provided  Performed by:  Arrie Senate, RN

## 2023-01-29 DIAGNOSIS — H04123 Dry eye syndrome of bilateral lacrimal glands: Secondary | ICD-10-CM | POA: Diagnosis not present

## 2023-01-29 DIAGNOSIS — H5213 Myopia, bilateral: Secondary | ICD-10-CM | POA: Diagnosis not present

## 2023-02-01 DIAGNOSIS — M25571 Pain in right ankle and joints of right foot: Secondary | ICD-10-CM | POA: Diagnosis not present

## 2023-02-01 DIAGNOSIS — M9902 Segmental and somatic dysfunction of thoracic region: Secondary | ICD-10-CM | POA: Diagnosis not present

## 2023-02-01 DIAGNOSIS — M546 Pain in thoracic spine: Secondary | ICD-10-CM | POA: Diagnosis not present

## 2023-02-01 DIAGNOSIS — M62830 Muscle spasm of back: Secondary | ICD-10-CM | POA: Diagnosis not present

## 2023-02-01 DIAGNOSIS — M25572 Pain in left ankle and joints of left foot: Secondary | ICD-10-CM | POA: Diagnosis not present

## 2023-02-01 DIAGNOSIS — M9901 Segmental and somatic dysfunction of cervical region: Secondary | ICD-10-CM | POA: Diagnosis not present

## 2023-02-01 DIAGNOSIS — M9903 Segmental and somatic dysfunction of lumbar region: Secondary | ICD-10-CM | POA: Diagnosis not present

## 2023-02-03 ENCOUNTER — Ambulatory Visit (INDEPENDENT_AMBULATORY_CARE_PROVIDER_SITE_OTHER): Payer: Medicare Other | Admitting: Podiatry

## 2023-02-03 ENCOUNTER — Encounter: Payer: Self-pay | Admitting: Podiatry

## 2023-02-03 DIAGNOSIS — M79675 Pain in left toe(s): Secondary | ICD-10-CM | POA: Diagnosis not present

## 2023-02-03 DIAGNOSIS — B351 Tinea unguium: Secondary | ICD-10-CM

## 2023-02-03 DIAGNOSIS — M79674 Pain in right toe(s): Secondary | ICD-10-CM | POA: Diagnosis not present

## 2023-02-05 DIAGNOSIS — M25571 Pain in right ankle and joints of right foot: Secondary | ICD-10-CM | POA: Diagnosis not present

## 2023-02-05 DIAGNOSIS — M62830 Muscle spasm of back: Secondary | ICD-10-CM | POA: Diagnosis not present

## 2023-02-05 DIAGNOSIS — M9902 Segmental and somatic dysfunction of thoracic region: Secondary | ICD-10-CM | POA: Diagnosis not present

## 2023-02-05 DIAGNOSIS — M9903 Segmental and somatic dysfunction of lumbar region: Secondary | ICD-10-CM | POA: Diagnosis not present

## 2023-02-05 DIAGNOSIS — M546 Pain in thoracic spine: Secondary | ICD-10-CM | POA: Diagnosis not present

## 2023-02-05 DIAGNOSIS — M9901 Segmental and somatic dysfunction of cervical region: Secondary | ICD-10-CM | POA: Diagnosis not present

## 2023-02-05 DIAGNOSIS — M25572 Pain in left ankle and joints of left foot: Secondary | ICD-10-CM | POA: Diagnosis not present

## 2023-02-05 NOTE — Progress Notes (Signed)
  Subjective:  Patient ID: [name] [name], female    DOB: June 17, 1940,  MRN: [id number]  [name] [name] presents to clinic today for callus(es) of both feet and painful thick toenails that are difficult to trim. Painful toenails interfere with ambulation. Aggravating factors include wearing enclosed shoe gear. Pain is relieved with periodic professional debridement. Painful calluses are aggravated when weightbearing with and without shoegear. Pain is relieved with periodic professional debridement.  Chief Complaint  Patient presents with   RFC    RFC BLOOD THINNER   New problem(s): None.   PCP is Kirstie Peri, MD.  Allergies  Allergen Reactions   Bupropion Hcl Other (See Comments)    Suicidal Thoughts.    Escitalopram Oxalate Other (See Comments)    Suicidal Thoughts.    Fluticasone-Salmeterol Other (See Comments)    Advair - Caused patient to go into Afib.    Lisinopril Cough   Serevent Other (See Comments)    Caused patient to go into Afib    Review of Systems: Negative except as noted in the HPI.  Objective: No changes noted in today's physical examination. There were no vitals filed for this visit. [name] [name] is a pleasant 82 y.o. female in NAD. AAO x 3.   Vascular Examination: Capillary refill time immediate b/l. Vascular status intact b/l with palpable pedal pulses. Pedal hair absent b/l. No pain with calf compression b/l. Skin temperature gradient WNL b/l. No cyanosis or clubbing b/l. No ischemia or gangrene noted b/l. No edema noted b/l LE.  Neurological Examination: Sensation grossly intact b/l with 10 gram monofilament. Vibratory sensation intact b/l. Pt has subjective symptoms of neuropathy.  Dermatological Examination: Pedal skin with normal turgor, texture and tone b/l.  No open wounds. No interdigital macerations.   Toenails 1-5 b/l thick, discolored, elongated with subungual debris and pain on dorsal palpation.   Minimal hyperkeratosis submet  head 1 b/l.   Musculoskeletal Examination: Muscle strength 5/5 to all lower extremity muscle groups bilaterally. Limited joint ROM to the 1st MPJ left foot. 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 Pes planus deformity noted bilateral LE.  Radiographs: None.  Assessment/Plan: 1. Pain due to onychomycosis of toenails of both feet    -Consent given for treatment as described below: -Examined patient. -Continue supportive shoe gear daily. -Toenails 1-5 b/l were debrided in length and girth with sterile nail nippers and dremel without iatrogenic bleeding.  -As a courtesy, callus(es) submet head 1 b/l gently filed without complication or incident. Total number pared=2. -Patient/POA to call should there be question/concern in the interim.   Return in about 9 weeks (around 04/07/2023).  Freddie Breech, DPM

## 2023-02-10 NOTE — Progress Notes (Signed)
Carelink Summary Report / Loop Recorder 

## 2023-02-16 DIAGNOSIS — J329 Chronic sinusitis, unspecified: Secondary | ICD-10-CM | POA: Diagnosis not present

## 2023-02-16 DIAGNOSIS — Z299 Encounter for prophylactic measures, unspecified: Secondary | ICD-10-CM | POA: Diagnosis not present

## 2023-02-16 DIAGNOSIS — I4891 Unspecified atrial fibrillation: Secondary | ICD-10-CM | POA: Diagnosis not present

## 2023-02-16 DIAGNOSIS — I700 Atherosclerosis of aorta: Secondary | ICD-10-CM | POA: Diagnosis not present

## 2023-02-16 DIAGNOSIS — J441 Chronic obstructive pulmonary disease with (acute) exacerbation: Secondary | ICD-10-CM | POA: Diagnosis not present

## 2023-02-22 ENCOUNTER — Ambulatory Visit (HOSPITAL_COMMUNITY)
Admission: RE | Admit: 2023-02-22 | Discharge: 2023-02-22 | Disposition: A | Discharge status: Home / Self Care | Payer: Medicare Other | Source: Ambulatory Visit | Attending: Gastroenterology | Admitting: Gastroenterology

## 2023-02-22 ENCOUNTER — Ambulatory Visit (HOSPITAL_COMMUNITY): Payer: Medicare Other | Attending: Internal Medicine | Admitting: Speech Pathology

## 2023-02-22 ENCOUNTER — Encounter (HOSPITAL_COMMUNITY): Payer: Self-pay | Admitting: Speech Pathology

## 2023-02-22 DIAGNOSIS — Z299 Encounter for prophylactic measures, unspecified: Secondary | ICD-10-CM | POA: Diagnosis not present

## 2023-02-22 DIAGNOSIS — I10 Essential (primary) hypertension: Secondary | ICD-10-CM | POA: Diagnosis not present

## 2023-02-22 DIAGNOSIS — R1312 Dysphagia, oropharyngeal phase: Secondary | ICD-10-CM | POA: Insufficient documentation

## 2023-02-22 DIAGNOSIS — E1169 Type 2 diabetes mellitus with other specified complication: Secondary | ICD-10-CM | POA: Diagnosis not present

## 2023-02-22 DIAGNOSIS — R1310 Dysphagia, unspecified: Secondary | ICD-10-CM | POA: Insufficient documentation

## 2023-02-22 DIAGNOSIS — R52 Pain, unspecified: Secondary | ICD-10-CM | POA: Diagnosis not present

## 2023-02-22 DIAGNOSIS — R058 Other specified cough: Secondary | ICD-10-CM | POA: Insufficient documentation

## 2023-02-22 DIAGNOSIS — R059 Cough, unspecified: Secondary | ICD-10-CM | POA: Diagnosis not present

## 2023-02-22 NOTE — Therapy (Signed)
Modified Barium Swallow Study  Patient Details  Name: [name] [name] MRN: [id number] Date of Birth: 1940-07-19  Today's Date: 02/22/2023  Modified Barium Swallow completed.  Full report located under Chart Review in the Imaging Section.  History of Present Illness [name] [name] is an 82 yo female who was referred by [name] Bode, NP for MBSS due to Pt with reports of coughing after meals. She has a past medical history of anxiety, asthma, A flutter, COPD, depression, HTN, HLD, OSA. She reports coughing/GERD and says when she eats or drinks she starts coughing, and coughs up clear phelgm. She says she has discussed this in the past and it is some what better. She takes omeprazole 40 mg Q am and famotidine 40 mg at bedtime. CT soft tissue neck 09/22/16-1. 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 She was recently seen by Dr [name] [name], pulmonology and indicates she has seen an ENT in the past for sinus difficulties.  Clinical Impression Pt presents with normal oropharyngeal swallow with prompt swallow trigger/initiation at the level of the valleculae, adequate hyolaryngeal excursion, no penetration/aspiration, and no significant residuals post swallow. Pt does have suspected cervical osteophytes and prominent cricopharyngeus, which did not impede bolus flow during this study. Esophageal sweep revealed some delayed distal esophageal emptying with some retrograde movement, but did eventually clear. This study was reviewed with Pt. While we were talking, she had several strong coughing episodes and then expectorated clear phlegm. Pt also reports a change in her singing range. She was given written reflux precautions and encouraged to replace coughing episodes with a sip of water or dry swallow when possible (to avoid excessive strain on her vocal folds). Pt already elevates the head of her bed and consumes frequent, smaller meals. She indicates that she notes increased coughing with cornbread  and dry crackers, so SLP suggested adding small amount of moisture (she also has xerostomia). Recommend regular textures, thin liquids via cup/straw, and PO medications whole with water. No further SLP services indicated at this time.  Factors that may increase risk of adverse event in presence of aspiration [name] [name] & [name] [name] 2021):  N/A  Swallow Evaluation Recommendations Recommendations: PO diet PO Diet Recommendation: Regular;Thin liquids (Level 0) Liquid Administration via: Straw;Cup Medication Administration: Whole meds with liquid Supervision: Patient able to self-feed Postural changes: Position pt fully upright for meals;Stay upright 30-60 min after meals Oral care recommendations: Oral care BID (2x/day);Pt independent with oral care Recommended consults: Consider ENT consultation (If Pt continues to be bothered by cough and voice changes)     Thank you,  [name] [name], CCC-SLP [phone]  [name] [name] 02/22/2023,1:04 PM

## 2023-02-26 DIAGNOSIS — I10 Essential (primary) hypertension: Secondary | ICD-10-CM | POA: Diagnosis not present

## 2023-02-28 LAB — CUP PACEART REMOTE DEVICE CHECK
Date Time Interrogation Session: 20241129230143
Implantable Pulse Generator Implant Date: 20220826

## 2023-03-01 ENCOUNTER — Encounter: Payer: Medicare Other | Attending: Internal Medicine | Admitting: *Deleted

## 2023-03-01 ENCOUNTER — Ambulatory Visit: Payer: Medicare Other

## 2023-03-01 VITALS — BP 130/88 | HR 61 | Temp 98.1°F

## 2023-03-01 DIAGNOSIS — J4521 Mild intermittent asthma with (acute) exacerbation: Secondary | ICD-10-CM | POA: Diagnosis not present

## 2023-03-01 DIAGNOSIS — I4819 Other persistent atrial fibrillation: Secondary | ICD-10-CM | POA: Diagnosis not present

## 2023-03-01 MED ORDER — MEPOLIZUMAB 100 MG ~~LOC~~ SOLR
100.0000 mg | Freq: Once | SUBCUTANEOUS | Status: AC
  Administered 2023-03-01: 100 mg via SUBCUTANEOUS

## 2023-03-01 NOTE — Progress Notes (Signed)
Diagnosis: Asthma  Provider:   Jetty Duhamel , MD  Procedure: Injection  Nucala (Mepolizumab), Dose: 100 mg, Site: subcutaneous, Number of injections: 1  Left arm  Post Care: Observation period completed  Discharge: Condition: Good, Destination: Home . AVS Provided  Performed by:  Daleen Squibb, RN

## 2023-03-03 DIAGNOSIS — I700 Atherosclerosis of aorta: Secondary | ICD-10-CM | POA: Diagnosis not present

## 2023-03-03 DIAGNOSIS — R053 Chronic cough: Secondary | ICD-10-CM | POA: Diagnosis not present

## 2023-03-03 DIAGNOSIS — K760 Fatty (change of) liver, not elsewhere classified: Secondary | ICD-10-CM | POA: Diagnosis not present

## 2023-03-03 DIAGNOSIS — R079 Chest pain, unspecified: Secondary | ICD-10-CM | POA: Diagnosis not present

## 2023-03-08 ENCOUNTER — Ambulatory Visit (INDEPENDENT_AMBULATORY_CARE_PROVIDER_SITE_OTHER): Payer: Medicare Other | Admitting: Podiatry

## 2023-03-08 ENCOUNTER — Ambulatory Visit (INDEPENDENT_AMBULATORY_CARE_PROVIDER_SITE_OTHER): Payer: Medicare Other

## 2023-03-08 ENCOUNTER — Other Ambulatory Visit (HOSPITAL_COMMUNITY): Payer: Self-pay | Admitting: *Deleted

## 2023-03-08 DIAGNOSIS — R059 Cough, unspecified: Secondary | ICD-10-CM | POA: Diagnosis not present

## 2023-03-08 DIAGNOSIS — M722 Plantar fascial fibromatosis: Secondary | ICD-10-CM

## 2023-03-08 DIAGNOSIS — M778 Other enthesopathies, not elsewhere classified: Secondary | ICD-10-CM

## 2023-03-08 DIAGNOSIS — R2681 Unsteadiness on feet: Secondary | ICD-10-CM

## 2023-03-08 DIAGNOSIS — Z299 Encounter for prophylactic measures, unspecified: Secondary | ICD-10-CM | POA: Diagnosis not present

## 2023-03-08 DIAGNOSIS — J45909 Unspecified asthma, uncomplicated: Secondary | ICD-10-CM | POA: Diagnosis not present

## 2023-03-08 DIAGNOSIS — J449 Chronic obstructive pulmonary disease, unspecified: Secondary | ICD-10-CM | POA: Diagnosis not present

## 2023-03-08 DIAGNOSIS — I480 Paroxysmal atrial fibrillation: Secondary | ICD-10-CM | POA: Diagnosis not present

## 2023-03-08 MED ORDER — GABAPENTIN 100 MG PO CAPS: 100.0000 mg | ORAL_CAPSULE | Freq: Every day | ORAL | 0 refills | Status: DC

## 2023-03-08 MED ORDER — DOFETILIDE 500 MCG PO CAPS: 500.0000 ug | ORAL_CAPSULE | Freq: Two times a day (BID) | ORAL | 1 refills | Status: DC

## 2023-03-08 NOTE — Progress Notes (Unsigned)
Subjective: Chief Complaint  Patient presents with   Foot Pain    RM#14 Bilateral foot pain getting severe.   82 year old female presents after the above concerns.  She states she was having quite a bit of pain to her foot but has since gotten better.  She states that she changed her shoes and to help her symptoms.  She states for a while the power steps have worked well and then about 2 weeks ago is when the pain came back located previously.  She did purchase new shoes which helped her foot.  She feels like her foot is going to flatten out again.  She has pain to the heel on the left side as well as of her symptoms been recently.  She does not report any recent injuries.  Unfortunately due to insurance she had to discontinue physical therapy for now but will restart after the first of the year as this was beneficial.  Objective: AAO x3, NAD DP/PT pulses palpable bilaterally, CRT less than 3 seconds Unable to appreciate any area pinpoint tenderness.  She gets tenderness palpation subjectively on the plantar aspect of the calcaneus and the insertion of plantar fascia left foot, able to identify any significant pain today.  There is no pain with lateral compression of calcaneus.  There is no edema to this area. Bunions are present and there is also prominence of metatarsal heads plantarly. No pain with calf compression, swelling, warmth, erythema  Assessment: Chronic foot pain, left foot plantar fasciitis  Plan: -All treatment options discussed with the patient including all alternatives, risks, complications.  -X-rays obtained reviewed bilaterally.  3 views of each foot were obtained.  Previous first MTPJ arthrodesis noted on the left foot.  Digital deformity noted at the IPJ of the hallux.  On the right foot bunion is present with arthritic changes present of the first MTPJ.  Bilaterally there is decreased calcaneal inclination angle.  Calcaneal spurring present. -I dispensed a new pair of  power steps.  I did take the orthotics and discussed with [name] [name], pedorthist to help make them softer as they are too hard for her.  I dispensed a plantar fascia brace to help support the plantar fascia.  Discussed continued home stretching, rehab exercises on a continual basis but she has not been consistent about it recently.  If symptoms recur consider steroid injection. -Patient encouraged to call the office with any questions, concerns, change in symptoms.   [name] [name] DPM

## 2023-03-08 NOTE — Patient Instructions (Signed)

## 2023-03-15 DIAGNOSIS — I700 Atherosclerosis of aorta: Secondary | ICD-10-CM | POA: Diagnosis not present

## 2023-03-15 DIAGNOSIS — J441 Chronic obstructive pulmonary disease with (acute) exacerbation: Secondary | ICD-10-CM | POA: Diagnosis not present

## 2023-03-15 DIAGNOSIS — Z299 Encounter for prophylactic measures, unspecified: Secondary | ICD-10-CM | POA: Diagnosis not present

## 2023-03-15 DIAGNOSIS — K760 Fatty (change of) liver, not elsewhere classified: Secondary | ICD-10-CM | POA: Diagnosis not present

## 2023-03-15 DIAGNOSIS — E1169 Type 2 diabetes mellitus with other specified complication: Secondary | ICD-10-CM | POA: Diagnosis not present

## 2023-03-17 DIAGNOSIS — M25571 Pain in right ankle and joints of right foot: Secondary | ICD-10-CM | POA: Diagnosis not present

## 2023-03-17 DIAGNOSIS — M546 Pain in thoracic spine: Secondary | ICD-10-CM | POA: Diagnosis not present

## 2023-03-17 DIAGNOSIS — M9902 Segmental and somatic dysfunction of thoracic region: Secondary | ICD-10-CM | POA: Diagnosis not present

## 2023-03-17 DIAGNOSIS — M9901 Segmental and somatic dysfunction of cervical region: Secondary | ICD-10-CM | POA: Diagnosis not present

## 2023-03-17 DIAGNOSIS — M9903 Segmental and somatic dysfunction of lumbar region: Secondary | ICD-10-CM | POA: Diagnosis not present

## 2023-03-17 DIAGNOSIS — M25572 Pain in left ankle and joints of left foot: Secondary | ICD-10-CM | POA: Diagnosis not present

## 2023-03-17 DIAGNOSIS — M62830 Muscle spasm of back: Secondary | ICD-10-CM | POA: Diagnosis not present

## 2023-03-19 DIAGNOSIS — Z299 Encounter for prophylactic measures, unspecified: Secondary | ICD-10-CM | POA: Diagnosis not present

## 2023-03-19 DIAGNOSIS — M25571 Pain in right ankle and joints of right foot: Secondary | ICD-10-CM | POA: Diagnosis not present

## 2023-03-19 DIAGNOSIS — M9903 Segmental and somatic dysfunction of lumbar region: Secondary | ICD-10-CM | POA: Diagnosis not present

## 2023-03-19 DIAGNOSIS — M542 Cervicalgia: Secondary | ICD-10-CM | POA: Diagnosis not present

## 2023-03-19 DIAGNOSIS — M9901 Segmental and somatic dysfunction of cervical region: Secondary | ICD-10-CM | POA: Diagnosis not present

## 2023-03-19 DIAGNOSIS — E1169 Type 2 diabetes mellitus with other specified complication: Secondary | ICD-10-CM | POA: Diagnosis not present

## 2023-03-19 DIAGNOSIS — M25572 Pain in left ankle and joints of left foot: Secondary | ICD-10-CM | POA: Diagnosis not present

## 2023-03-19 DIAGNOSIS — M62830 Muscle spasm of back: Secondary | ICD-10-CM | POA: Diagnosis not present

## 2023-03-19 DIAGNOSIS — M546 Pain in thoracic spine: Secondary | ICD-10-CM | POA: Diagnosis not present

## 2023-03-19 DIAGNOSIS — I10 Essential (primary) hypertension: Secondary | ICD-10-CM | POA: Diagnosis not present

## 2023-03-19 DIAGNOSIS — J449 Chronic obstructive pulmonary disease, unspecified: Secondary | ICD-10-CM | POA: Diagnosis not present

## 2023-03-19 DIAGNOSIS — M9902 Segmental and somatic dysfunction of thoracic region: Secondary | ICD-10-CM | POA: Diagnosis not present

## 2023-03-22 DIAGNOSIS — M9901 Segmental and somatic dysfunction of cervical region: Secondary | ICD-10-CM | POA: Diagnosis not present

## 2023-03-22 DIAGNOSIS — M62830 Muscle spasm of back: Secondary | ICD-10-CM | POA: Diagnosis not present

## 2023-03-22 DIAGNOSIS — M25571 Pain in right ankle and joints of right foot: Secondary | ICD-10-CM | POA: Diagnosis not present

## 2023-03-22 DIAGNOSIS — M25572 Pain in left ankle and joints of left foot: Secondary | ICD-10-CM | POA: Diagnosis not present

## 2023-03-22 DIAGNOSIS — M9902 Segmental and somatic dysfunction of thoracic region: Secondary | ICD-10-CM | POA: Diagnosis not present

## 2023-03-22 DIAGNOSIS — M546 Pain in thoracic spine: Secondary | ICD-10-CM | POA: Diagnosis not present

## 2023-03-22 DIAGNOSIS — M9903 Segmental and somatic dysfunction of lumbar region: Secondary | ICD-10-CM | POA: Diagnosis not present

## 2023-03-26 DIAGNOSIS — M9901 Segmental and somatic dysfunction of cervical region: Secondary | ICD-10-CM | POA: Diagnosis not present

## 2023-03-26 DIAGNOSIS — M62830 Muscle spasm of back: Secondary | ICD-10-CM | POA: Diagnosis not present

## 2023-03-26 DIAGNOSIS — M25572 Pain in left ankle and joints of left foot: Secondary | ICD-10-CM | POA: Diagnosis not present

## 2023-03-26 DIAGNOSIS — M25571 Pain in right ankle and joints of right foot: Secondary | ICD-10-CM | POA: Diagnosis not present

## 2023-03-26 DIAGNOSIS — M9903 Segmental and somatic dysfunction of lumbar region: Secondary | ICD-10-CM | POA: Diagnosis not present

## 2023-03-26 DIAGNOSIS — M546 Pain in thoracic spine: Secondary | ICD-10-CM | POA: Diagnosis not present

## 2023-03-26 DIAGNOSIS — M9902 Segmental and somatic dysfunction of thoracic region: Secondary | ICD-10-CM | POA: Diagnosis not present

## 2023-03-29 ENCOUNTER — Encounter: Payer: Medicare Other | Admitting: Emergency Medicine

## 2023-03-29 VITALS — BP 134/66 | HR 67 | Temp 97.8°F | Resp 18

## 2023-03-29 DIAGNOSIS — J4521 Mild intermittent asthma with (acute) exacerbation: Secondary | ICD-10-CM

## 2023-03-29 DIAGNOSIS — I10 Essential (primary) hypertension: Secondary | ICD-10-CM | POA: Diagnosis not present

## 2023-03-29 MED ORDER — MEPOLIZUMAB 100 MG ~~LOC~~ SOLR
100.0000 mg | Freq: Once | SUBCUTANEOUS | Status: AC
  Administered 2023-03-29: 100 mg via SUBCUTANEOUS

## 2023-03-29 NOTE — Progress Notes (Signed)
Diagnosis: Asthma  Provider:   Jetty Duhamel   Procedure: Injection  Nucala (Mepolizumab), Dose: 100 mg, Site: subcutaneous, Number of injections: 1  Injection Site(s): Right arm  Post Care: Patient declined observation  Discharge: Condition: Good, Destination: Home . AVS Provided  Performed by:  Arrie Senate, RN

## 2023-03-30 DIAGNOSIS — K219 Gastro-esophageal reflux disease without esophagitis: Secondary | ICD-10-CM | POA: Diagnosis not present

## 2023-03-30 DIAGNOSIS — E1169 Type 2 diabetes mellitus with other specified complication: Secondary | ICD-10-CM | POA: Diagnosis not present

## 2023-03-30 DIAGNOSIS — Z299 Encounter for prophylactic measures, unspecified: Secondary | ICD-10-CM | POA: Diagnosis not present

## 2023-03-30 DIAGNOSIS — I700 Atherosclerosis of aorta: Secondary | ICD-10-CM | POA: Diagnosis not present

## 2023-03-30 DIAGNOSIS — J449 Chronic obstructive pulmonary disease, unspecified: Secondary | ICD-10-CM | POA: Diagnosis not present

## 2023-03-30 DIAGNOSIS — I10 Essential (primary) hypertension: Secondary | ICD-10-CM | POA: Diagnosis not present

## 2023-04-02 ENCOUNTER — Telehealth: Payer: Self-pay

## 2023-04-02 ENCOUNTER — Other Ambulatory Visit: Payer: Self-pay

## 2023-04-02 NOTE — Telephone Encounter (Signed)
 Auth Submission: NO AUTH NEEDED Site of care: Site of care: [facility] Payer: medicare a/b, bcbs supp Medication & CPT/J Code(s) submitted: Nucala  (Mepolizumab ) G7817 Route of submission (phone, fax, portal): portal Phone # Fax # Auth type: Buy/Bill HB Units/visits requested: 100mg , q4weeks Reference number:  Approval from: 04/02/2023 to 03/29/24

## 2023-04-04 NOTE — Progress Notes (Signed)
 HPI female never smoker followed for OSA, allergic rhinitis, asthma, Hyper IgE,  complicated by A. Fib/Coumadin , HBP, GERD, obesity NPSG prior to EMR in 2012 Office Spirometry 09/02/16-WNL. FVC 2.06/76%, FEV1 1.57/77%, ratio 0.76, FEF 25-75% 1.26/80% Allergy  labs 09/02/16- eosinophils 200, total IgE 4230 causing elevation of all specific allergen antibody levels including Aspergillus. Nucala   + 2018 PFT-10/13/16-minimal obstruction without response to dilator, minimal reduction of diffusion. FVC 2.02/77%, FEV1 1.62/83%, ratio 0.80, TLC 92%, DLCO 77% CT soft tissue neck 09/22/16-1. 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  Subsegmental atelectasis and nonspecific linear patchy densities in the lungs as described. They have a benign appearance.   Small hyperdense masses in the mediastinum are stable compared with 2015 supporting benign etiology such as ectopic thyroid  tissue.  Three-vessel prominent coronary artery calcification.  Bibasilar bronchiolectasis. Labs 09/20/2017-WBC 14,900, hemoglobin 11.2, eosinophils absent, lymphocytes low( ?steroids or viral?), BNP 220, CMET wnl IgE 04/12/2018- 2,574, 10/16/16- 4,302, 11/09/13- 3,513 Office Spirometry 04/12/2018-mild restriction of exhaled volume.  Possible mild obstructive airways.  FVC 1.9/73%, FEV1 1.4/75%, ratio 1.76, FEF 25-75% 1.2/80%  -------------------------------------------------------------------------------------------  12/31/22- 83 year old female never smoker followed for OSA, allergic Rhinitis, Asthma/ Nucala , Hyper IgE, complicated by PAFibXarelto/ Pacemaker, HBP, GERD, obesity, Anemia,  -Neb Pulmicort /  Duoneb   Anoro, Flonase , Zyrtec, Singulair , Nucala ,  CPAP 5-15/Adapt      AirSense 11 AutoSet Download- compliance 100%, AHI 1.6/hr Body weight today- 192 lbs Download reviewed.  She went back to using an old machine that she prefers but it is working fine for now.  She will  discuss with her homecare company. Physical therapy has been a big help. Has had episodes of nasal stuffiness and head congestion she refers to as allergy  attacks recently without specific trigger identified.  Had COVID infection marked by sinusitis and also, she says strep throat. Using her incentive spirometry.  During COVID she used her nebulizer twice a day but not needing it now.    04/06/23- 83 year old female never smoker followed for OSA, allergic Rhinitis, Asthma/ Nucala , Hyper IgE, complicated by PAFibXarelto/ Pacemaker, HBP, GERD, obesity, Anemia,  -Neb Pulmicort /  Duoneb   Anoro, Flonase , Zyrtec, Singulair , Nucala ,  CPAP 5-15/Adapt      AirSense 11 AutoSet Download- compliance 63%, AHI 1.2/hr Body weight today- 194 lbs Had Speech Therapy eval with [facility]. No penetration. Rec sips, upright after meals, ENT if persistent problem. Discussed the use of AI scribe software for clinical note transcription with the patient, who gave verbal consent to proceed.  History of Present Illness   The patient, with a history of severe asthmatic bronchitis, presents after a recent exacerbation. She was diagnosed with asthmatic bronchitis and treated with budesonide  and amoxicillin , limited by her cardiac medication, Tikosyn . She reports a persistent cough and hoarseness, but notes an improvement in her symptoms over the last few days. She is currently using DuoNeb four times a day and budesonide  twice a day. She also had a COVID infection in September, which she reports did not significantly affect her lungs. She is actively managing her condition with daily use of an inspiratory spirometer, an asthma meter, and an Acapella device. She also reports a potential issue with her CPAP machine (noise- possibly worn bearing), which she uses for sleep apnea. She has a history of reflux and has been experiencing coughing spasms after eating, particularly dry foods. She has been recommended to undergo an endoscopy by a  gastroenterologist. Recent Speech Therapy with [facility] did not show  penetration.     ROS-see HPI   + = positive Constitutional:   No-   weight loss, night sweats, fevers, chills, fatigue, lassitude. HEENT:   No-  headaches, difficulty swallowing, tooth/dental problems, sore throat,       No-  sneezing, itching,  ear ache,  +nasal congestion, post nasal drip,  CV:  No-   chest pain, orthopnea, PND, swelling in lower extremities, anasarca, dizziness, palpitations Resp: +  shortness of breath with exertion or at rest.                +productive cough,  + non-productive cough,  No- coughing up of blood.              No-change in color of mucus. + wheezing.   Skin: No-   rash or lesions. GI:  No-   heartburn, indigestion, abdominal pain, nausea, vomiting, GU:  MS:  No-   joint pain or swelling. . Neuro-     nothing unusual Psych:  No- change in mood or affect. No depression or anxiety.  No memory loss.  OBJ- Physical Exam General- Alert, Oriented, Affect-appropriate, Distress- none acute, + obese Skin- rash-none, lesions- none, excoriation- none Lymphadenopathy- none Head- atraumatic            Eyes- Gross vision intact, PERRLA, conjunctivae and secretions clear            Ears- Hearing, canals-normal            Nose- No turbinate edema, no-Septal dev, mucus, polyps, erosion, perforation             Throat- Mallampati III-IV , mucosa- not red,  drainage- none, tonsils- atrophic Neck- flexible , trachea midline, no stridor , thyroid  nl, carotid no bruit Chest - symmetrical excursion , unlabored           Heart/CV- RR today/ hx AFib(no pacemaker) , no murmur , no gallop, no rub, nl  s1 s2                  - JVD- none , edema- none, stasis changes- none, varices- none           Lung- + clear  Wheeze-none , cough-none,                         dullness-none, rub- none           Chest wall-  Abd- Br/ Gen/ Rectal- Not done, not indicated Extrem- cyanosis- none, clubbing, none, atrophy- none,  strength- nl Neuro- grossly intact to observation  Assessment and Plan    Severe Asthmatic Bronchitis Recent exacerbation with coughing and hoarseness. Improved with budesonide  and amoxicillin . Limited antibiotic options due to Tikosyn  use. Currently using DuoNeb four times a day and budesonide  twice a day. Lungs sound clear on examination. -Continue current treatment regimen. -Consider further evaluation if symptoms worsen.  Upper Airway Cough Possible reflux or upper airway cough syndrome contributing to coughing spasms, particularly after eating dry foods. -Consider further evaluation by gastroenterologist as planned and possible endoscopy. -Consider ENT consultation for vocal cord evaluation.  Obstructive Sleep Apnea Using auto CPAP with settings 5-15. Recent concern about machine noise. -Continue current CPAP settings. -Contact home care company if machine issues persist.  General Health Maintenance -Administer Prevnar 20 Pneumonia vaccine today. -Defer COVID booster until 3 months post-infection. -Continue current respiratory exercises and monitoring. -Schedule follow-up in 3 months- Pt request.

## 2023-04-05 ENCOUNTER — Ambulatory Visit (INDEPENDENT_AMBULATORY_CARE_PROVIDER_SITE_OTHER): Payer: Medicare Other | Admitting: Gastroenterology

## 2023-04-05 ENCOUNTER — Ambulatory Visit (INDEPENDENT_AMBULATORY_CARE_PROVIDER_SITE_OTHER): Payer: Medicare Other

## 2023-04-05 DIAGNOSIS — R001 Bradycardia, unspecified: Secondary | ICD-10-CM | POA: Diagnosis not present

## 2023-04-05 LAB — CUP PACEART REMOTE DEVICE CHECK
Date Time Interrogation Session: 20250105232522
Implantable Pulse Generator Implant Date: 20220826

## 2023-04-06 ENCOUNTER — Encounter: Payer: Self-pay | Admitting: Internal Medicine

## 2023-04-06 ENCOUNTER — Ambulatory Visit: Payer: Medicare Other | Admitting: Internal Medicine

## 2023-04-06 VITALS — BP 120/50 | HR 60 | Ht 61.5 in | Wt 194.4 lb

## 2023-04-06 DIAGNOSIS — Z23 Encounter for immunization: Secondary | ICD-10-CM

## 2023-04-06 DIAGNOSIS — J209 Acute bronchitis, unspecified: Secondary | ICD-10-CM

## 2023-04-06 DIAGNOSIS — G4733 Obstructive sleep apnea (adult) (pediatric): Secondary | ICD-10-CM

## 2023-04-06 NOTE — Patient Instructions (Signed)
 We can continue CPAP auto 5-15  Ok to continue your breathing meds as you are doing.  Order- Prevnar 20 pneumococcal vaccine

## 2023-04-07 ENCOUNTER — Ambulatory Visit (INDEPENDENT_AMBULATORY_CARE_PROVIDER_SITE_OTHER): Payer: Medicare Other | Admitting: Podiatry

## 2023-04-07 ENCOUNTER — Encounter: Payer: Self-pay | Admitting: Podiatry

## 2023-04-07 DIAGNOSIS — B351 Tinea unguium: Secondary | ICD-10-CM | POA: Diagnosis not present

## 2023-04-07 DIAGNOSIS — M79674 Pain in right toe(s): Secondary | ICD-10-CM

## 2023-04-07 DIAGNOSIS — M79675 Pain in left toe(s): Secondary | ICD-10-CM

## 2023-04-13 NOTE — Progress Notes (Signed)
  Subjective:  Patient ID: [name] [name], female    DOB: Mar 16, 1941,  MRN: [id number]  83 y.o. female presents painful elongated mycotic toenails 1-5 bilaterally which are tender when wearing enclosed shoe gear. Pain is relieved with periodic professional debridement. Chief Complaint  Patient presents with   [facility]    RM#RFC Patient states is on blood thinner.   New problem(s): None   PCP is Maree Isles, MD.  Allergies  Allergen Reactions   Bupropion Hcl Other (See Comments)    Suicidal Thoughts.    Escitalopram Oxalate Other (See Comments)    Suicidal Thoughts.    Fluticasone -Salmeterol Other (See Comments)    Advair - Caused patient to go into Afib.    Lisinopril Cough   Serevent Other (See Comments)    Caused patient to go into Afib   Review of Systems: Negative except as noted in the HPI.   Objective:  [name] [name] is a pleasant 83 y.o. female in NAD. AAO x 3.  Vascular Examination: Vascular status intact b/l with palpable pedal pulses. CFT immediate b/l. Pedal hair present. No edema. No pain with calf compression b/l. Skin temperature gradient WNL b/l. No varicosities noted. No cyanosis or clubbing noted.  Neurological Examination: Pt has subjective symptoms of neuropathy. Sensation grossly intact b/l with 10 gram monofilament. Vibratory sensation intact b/l.  Dermatological Examination: Pedal skin with normal turgor, texture and tone b/l. No open wounds nor interdigital macerations noted. Toenails 1-5 b/l thick, discolored, elongated with subungual debris and pain on dorsal palpation. No hyperkeratotic lesions noted b/l.   Musculoskeletal Examination: Muscle strength 5/5 to b/l LE.  Hammertoes 2-5 b/l. Limited joint ROM to the 1st MPJ left foot. Pes planus deformity noted bilateral LE.  Radiographs: None   Assessment:   1. Pain due to onychomycosis of toenails of both feet    Plan:  Patient was evaluated and treated. All patient's and/or POA's  questions/concerns addressed on today's visit. Toenails 1-5 debrided in length and girth without incident. Continue soft, supportive shoe gear daily. Report any pedal injuries to medical professional. Call office if there are any questions/concerns. -Patient/POA to call should there be question/concern in the interim.  Return in about 3 months (around 07/06/2023).  Delon LITTIE Merlin, DPM      [place] LOCATION: [address] [place] [address]                   Office [phone]   [facility] LOCATION: [address] [place], [place] [address] Office [phone]

## 2023-04-14 ENCOUNTER — Ambulatory Visit: Payer: Medicare Other

## 2023-04-14 NOTE — Progress Notes (Signed)
 Patient was present for adjustment of one of the two pair of orthotics she received a while back and has not been able to wear  Patient has been wearing power steps as of late  Patient reports custom orthotics are too firm / hard  I ground out arches of BIL inserts and heated to make more flexible   I also measured patient for LLD discrepancy  Left LE is approx 1/2" shorter than right  Patient report BIL hip replacements in the past  I added 1/4" lift in left shoe to see if it helps with noted hip and leg pain  Patient will wear and will call in 2 weeks to let me know how everything is working  Break in schedule was given patient will call if any other problems arise  Britton Cane CPed, CFo, CFm

## 2023-04-19 ENCOUNTER — Ambulatory Visit: Payer: Medicare Other | Attending: Cardiology | Admitting: Cardiology

## 2023-04-19 ENCOUNTER — Encounter: Payer: Self-pay | Admitting: Cardiology

## 2023-04-19 VITALS — BP 134/54 | HR 57 | Ht 61.5 in | Wt 195.6 lb

## 2023-04-19 DIAGNOSIS — Z7901 Long term (current) use of anticoagulants: Secondary | ICD-10-CM | POA: Diagnosis not present

## 2023-04-19 DIAGNOSIS — G4733 Obstructive sleep apnea (adult) (pediatric): Secondary | ICD-10-CM

## 2023-04-19 DIAGNOSIS — I4819 Other persistent atrial fibrillation: Secondary | ICD-10-CM

## 2023-04-19 DIAGNOSIS — E1169 Type 2 diabetes mellitus with other specified complication: Secondary | ICD-10-CM | POA: Diagnosis not present

## 2023-04-19 DIAGNOSIS — I10 Essential (primary) hypertension: Secondary | ICD-10-CM

## 2023-04-19 DIAGNOSIS — Z79899 Other long term (current) drug therapy: Secondary | ICD-10-CM | POA: Diagnosis not present

## 2023-04-19 NOTE — Progress Notes (Signed)
    Cardiology Office Note  Date: 04/19/2023   ID: Zuriah, Ehrgott 02-10-1941, MRN [id number]  History of Present Illness: [name] [name] is an 83 y.o. female last seen in October 2024.  She is here for a routine visit.  Reports no change in pattern of her palpitations.  NYHA class II dyspnea with basic activities.  She has not been exercising as regularly during the winter months.  ILR interrogation from January of this year showed approximately 17% AF burden.  We went over her medications today.  Current cardiac regimen includes Lipitor, Cardizem CD, as needed short acting Cardizem, Tikosyn, hydralazine, Cozaar, potassium supplement, Xarelto, and Aldactone.  She has had bruising on her arms, somewhat more prominent recently.  No reported stool changes.  We discussed getting follow-up lab work.  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  Physical Exam: VS:  BP (!) 134/54   Pulse (!) 57   Ht 5' 1.5" (1.562 m)   Wt 195 lb 9.6 oz (88.7 kg)   SpO2 99%   BMI 36.36 kg/m , BMI Body mass index is 36.36 kg/m.  Wt Readings from Last 3 Encounters:  04/19/23 195 lb 9.6 oz (88.7 kg)  04/06/23 194 lb 6.4 oz (88.2 kg)  01/13/23 192 lb 9.6 oz (87.4 kg)    General: Patient appears comfortable at rest. HEENT: Conjunctiva and lids normal. Neck: Supple, no elevated JVP or carotid bruits. Lungs: Clear to auscultation, nonlabored breathing at rest. Cardiac: Regular rate and rhythm, no S3, 2/6 systolic murmur, no pericardial rub. Extremities: No pitting edema.  ECG:  An ECG dated 01/07/2023 was personally reviewed today and demonstrated:  Sinus rhythm with left anterior fascicular block, borderline increased voltage, QRS 120 ms, QTc 493 ms.  Labwork: 01/07/2023: BUN 17; Creatinine, Ser 1.10; Magnesium 2.1; Potassium 4.1; Sodium 137  October 2024: BUN 20, creatinine 1.18, potassium  4.2  Other Studies Reviewed Today:  No interval cardiac testing for review today.  Assessment and Plan:  1.  Persistent atrial fibrillation as well as atypical atrial flutter, CHA2DS2-VASc score is at least 5.  She remains symptomatically stable on current regimen including Tikosyn, Cardizem CD, as needed short acting Cardizem, and Xarelto.  ILR interrogation this month indicate approximately 17% AF burden.  Plan to check CBC and BMET.  2.  OSA on CPAP.  She continues on treatment.  3.  Primary hypertension.  Systolic 134 today.  Plan to continue current regimen including Aldactone, hydralazine, and Cozaar.  Disposition:  Follow up  6 months.  Signed, [name], M.D., F.A.C.C. [facility]

## 2023-04-19 NOTE — Patient Instructions (Signed)
Medication Instructions:   Continue all current medications.   Labwork:  BMET, CBC - orders given today Office will contact with results via phone, letter or mychart.     Testing/Procedures:  none  Follow-Up:  6 months   Any Other Special Instructions Will Be Listed Below (If Applicable).   If you need a refill on your cardiac medications before your next appointment, please call your pharmacy.

## 2023-04-20 ENCOUNTER — Encounter: Payer: Self-pay | Admitting: Internal Medicine

## 2023-04-26 ENCOUNTER — Encounter: Payer: Medicare Other | Attending: Internal Medicine | Admitting: *Deleted

## 2023-04-26 VITALS — BP 150/45 | HR 58 | Resp 18

## 2023-04-26 DIAGNOSIS — J4520 Mild intermittent asthma, uncomplicated: Secondary | ICD-10-CM

## 2023-04-26 DIAGNOSIS — J449 Chronic obstructive pulmonary disease, unspecified: Secondary | ICD-10-CM | POA: Diagnosis not present

## 2023-04-26 DIAGNOSIS — Z299 Encounter for prophylactic measures, unspecified: Secondary | ICD-10-CM | POA: Diagnosis not present

## 2023-04-26 DIAGNOSIS — D692 Other nonthrombocytopenic purpura: Secondary | ICD-10-CM | POA: Diagnosis not present

## 2023-04-26 DIAGNOSIS — J029 Acute pharyngitis, unspecified: Secondary | ICD-10-CM | POA: Diagnosis not present

## 2023-04-26 DIAGNOSIS — J020 Streptococcal pharyngitis: Secondary | ICD-10-CM | POA: Diagnosis not present

## 2023-04-26 MED ORDER — MEPOLIZUMAB 100 MG ~~LOC~~ SOLR
100.0000 mg | Freq: Once | SUBCUTANEOUS | Status: AC
  Administered 2023-04-26: 100 mg via SUBCUTANEOUS

## 2023-04-26 NOTE — Progress Notes (Signed)
Diagnosis: Asthma  Provider:   Westley Chandler, MD  Procedure: Injection  Nucala (Mepolizumab), Dose: 100 mg, Site: subcutaneous, Number of injections: 1  Injection Site(s): Left arm  Post Care: Observation period completed  Discharge: Condition: Good, Destination: Home . AVS Provided  Performed by:  Daleen Squibb, RN

## 2023-04-28 DIAGNOSIS — I10 Essential (primary) hypertension: Secondary | ICD-10-CM | POA: Diagnosis not present

## 2023-05-04 DIAGNOSIS — J029 Acute pharyngitis, unspecified: Secondary | ICD-10-CM | POA: Diagnosis not present

## 2023-05-04 DIAGNOSIS — Z299 Encounter for prophylactic measures, unspecified: Secondary | ICD-10-CM | POA: Diagnosis not present

## 2023-05-04 DIAGNOSIS — R52 Pain, unspecified: Secondary | ICD-10-CM | POA: Diagnosis not present

## 2023-05-04 DIAGNOSIS — I10 Essential (primary) hypertension: Secondary | ICD-10-CM | POA: Diagnosis not present

## 2023-05-04 DIAGNOSIS — J020 Streptococcal pharyngitis: Secondary | ICD-10-CM | POA: Diagnosis not present

## 2023-05-05 ENCOUNTER — Encounter: Payer: Self-pay | Admitting: Podiatry

## 2023-05-05 ENCOUNTER — Telehealth: Payer: Self-pay

## 2023-05-05 DIAGNOSIS — I10 Essential (primary) hypertension: Secondary | ICD-10-CM | POA: Diagnosis not present

## 2023-05-05 DIAGNOSIS — Z299 Encounter for prophylactic measures, unspecified: Secondary | ICD-10-CM | POA: Diagnosis not present

## 2023-05-05 DIAGNOSIS — J020 Streptococcal pharyngitis: Secondary | ICD-10-CM | POA: Diagnosis not present

## 2023-05-05 NOTE — Telephone Encounter (Signed)
 Called pt asked her if she needed an orthotic adjustment and she said hers are being refurbished so she would like to speak with tricia.

## 2023-05-06 ENCOUNTER — Encounter: Payer: Self-pay | Admitting: *Deleted

## 2023-05-06 NOTE — Progress Notes (Signed)
 Received fax notification from [employer] for approval of xarelto  20 mg daily from 05/05/2023 through 05/04/2024.

## 2023-05-07 ENCOUNTER — Other Ambulatory Visit: Payer: Self-pay

## 2023-05-07 ENCOUNTER — Telehealth: Payer: Self-pay | Admitting: Podiatry

## 2023-05-07 DIAGNOSIS — M25571 Pain in right ankle and joints of right foot: Secondary | ICD-10-CM | POA: Diagnosis not present

## 2023-05-07 DIAGNOSIS — I480 Paroxysmal atrial fibrillation: Secondary | ICD-10-CM

## 2023-05-07 DIAGNOSIS — M25572 Pain in left ankle and joints of left foot: Secondary | ICD-10-CM | POA: Diagnosis not present

## 2023-05-07 DIAGNOSIS — M9902 Segmental and somatic dysfunction of thoracic region: Secondary | ICD-10-CM | POA: Diagnosis not present

## 2023-05-07 DIAGNOSIS — M9901 Segmental and somatic dysfunction of cervical region: Secondary | ICD-10-CM | POA: Diagnosis not present

## 2023-05-07 DIAGNOSIS — M62830 Muscle spasm of back: Secondary | ICD-10-CM | POA: Diagnosis not present

## 2023-05-07 DIAGNOSIS — M546 Pain in thoracic spine: Secondary | ICD-10-CM | POA: Diagnosis not present

## 2023-05-07 DIAGNOSIS — M9903 Segmental and somatic dysfunction of lumbar region: Secondary | ICD-10-CM | POA: Diagnosis not present

## 2023-05-07 NOTE — Telephone Encounter (Signed)
 Prescription refill request for Xarelto  received.  Indication: Afib  Last office visit: 04/19/23 Berna)  Weight: 88.7kg Age: 83 Scr: 1.24 (04/19/23)  CrCl: 48.62ml/min  Refill request received from [facility] Per dosing criteria, current dose is not appropriate dose. Will forward to PharmD team to review.

## 2023-05-07 NOTE — Telephone Encounter (Signed)
 Received message requesting PT referral to [facility], faxed

## 2023-05-10 ENCOUNTER — Ambulatory Visit: Payer: Medicare Other

## 2023-05-10 DIAGNOSIS — R001 Bradycardia, unspecified: Secondary | ICD-10-CM | POA: Diagnosis not present

## 2023-05-10 DIAGNOSIS — I4819 Other persistent atrial fibrillation: Secondary | ICD-10-CM

## 2023-05-11 ENCOUNTER — Ambulatory Visit (INDEPENDENT_AMBULATORY_CARE_PROVIDER_SITE_OTHER): Payer: Medicare Other | Admitting: Gastroenterology

## 2023-05-11 ENCOUNTER — Encounter (INDEPENDENT_AMBULATORY_CARE_PROVIDER_SITE_OTHER): Payer: Self-pay | Admitting: Gastroenterology

## 2023-05-11 ENCOUNTER — Telehealth: Payer: Self-pay | Admitting: *Deleted

## 2023-05-11 VITALS — BP 119/61 | HR 59 | Temp 97.1°F | Ht 61.5 in | Wt 195.5 lb

## 2023-05-11 DIAGNOSIS — R059 Cough, unspecified: Secondary | ICD-10-CM | POA: Diagnosis not present

## 2023-05-11 DIAGNOSIS — R058 Other specified cough: Secondary | ICD-10-CM

## 2023-05-11 DIAGNOSIS — D649 Anemia, unspecified: Secondary | ICD-10-CM | POA: Diagnosis not present

## 2023-05-11 DIAGNOSIS — R1310 Dysphagia, unspecified: Secondary | ICD-10-CM | POA: Insufficient documentation

## 2023-05-11 DIAGNOSIS — K219 Gastro-esophageal reflux disease without esophagitis: Secondary | ICD-10-CM | POA: Diagnosis not present

## 2023-05-11 LAB — CUP PACEART REMOTE DEVICE CHECK
Date Time Interrogation Session: 20250209232657
Implantable Pulse Generator Implant Date: 20220826

## 2023-05-11 MED ORDER — RIVAROXABAN 15 MG PO TABS: 15.0000 mg | ORAL_TABLET | Freq: Every day | ORAL | 1 refills | Status: DC

## 2023-05-11 NOTE — H&P (View-Only) (Signed)
 Referring Provider: Kirstie Peri, MD Primary Care Physician:  Kirstie Peri, MD Primary GI Physician: Dr. Levon Hedger   Chief Complaint  Patient presents with   Gastroesophageal Reflux    Follow up on GERD. Still having some coughing when eating. Feels like a tickle in throat when eating anything dry.  Taking protonix 40mg  bid. Since taking some antibiotics for strep states it has helped her symtpoms.    HPI:   [name] [name] is a 83 y.o. female with past medical history of anxiety, asthma, A flutter, COPD, depression, HTN, HLD, OSA.     Patient presenting today for follow up of GERD/atypical cough   Last seen October 2024, at that time patient reported some continued coughing when she eats or drinks though this has improved from previously.  Denied any dysphagia.  Is interested in swallow study with SLP though did not want to do it at current time.  Maintained on omeprazole 40 mg daily and famotidine 20 mg as needed which seems to keep her GERD symptoms controlled.  Patient did report iron pills that were sent by PCP were stopped as she ran out.  Patient advised to continue omeprazole 40 mg daily, continue famotidine 20 mg nightly as needed, H&H and iron studies, consider MBSS with SLP, EGD and colonoscopy if iron deficiency noted.  Labs in January with hemoglobin 11.2 though does not appear iron studies were ever completed.  Patient did undergo SLP swallow study 02/22/2023 which showed normal oropharyngeal swallow with prompt swallow trigger/initiation at the level of the valleculae, adequate hyolaryngeal excursion, no penetration/aspiration, and no significant residuals post swallow. Pt does have suspected cervical osteophytes and prominent cricopharyngeus, which did not impede bolus flow during this study. Esophageal sweep revealed some delayed distal esophageal emptying with some retrograde movement, but did eventually clear. This study was reviewed with Pt. While we were talking, she  had several strong coughing episodes and then expectorated clear phlegm. Pt also reports a change in her singing range. She was given written reflux precautions and encouraged to replace coughing episodes with a sip of water or dry swallow when possible (to avoid excessive strain on her vocal folds). Pt already elevates the head of her bed and consumes frequent, smaller meals. She indicates that she notes increased coughing with cornbread and dry crackers, so SLP suggested adding small amount of moisture (she also has xerostomia). Recommend regular textures, thin liquids via cup/straw, and PO medications whole with water. No further SLP services indicated at this time.  Recommended possible ENT referral if symptoms persisted.  Present: patient states she has had strep throat for about 3 weeks, she has had multiple rounds of steroids and antibiotics. She is feeling much better today. She also reports some improvement in her coughing since being on steroids/antibiotics over the past few weeks. She denies dysphagia but can sometimes feel her food passing when it hits the back of her throat and in upper chest. Notes more sinus drainage on the left side and a lot of irritation on the left side of her throat but this seems some better.   PCP changed her omeprazole to protonix 40mg  BID but she is not sure this has made much difference though she has not really had a lot of GERD symptoms even prior to this change.    Last colonoscopy: 06/09/12 Examination performed to cecum. Few small diverticula at sigmoid colon. Small external hemorrhoids. Last Endoscopy:2018 negative - Multiple whitish plaques in the upper third of  the esophagus. Brushing obtained for KOH.                           - Normal middle third of esophagus, lower third of                            esophagus and gastroesophageal junction.                           - Z-line regular, 39 cm from the incisors.                            - 2 cm hiatal hernia.                           - Multiple gastric polyps. Four biopsied.                           - Normal duodenal bulb and second portion of the                            duodenum.   Past Medical History:  Diagnosis Date   Allergic rhinitis    Anxiety    Asthma    Atrial flutter (HCC)    COPD (chronic obstructive pulmonary disease) (HCC)    Depression    Essential hypertension    Hyperlipidemia    Lumbar disc disease    Obesity    OSA (obstructive sleep apnea)    CPAP   Paroxysmal atrial fibrillation (HCC)    Element of tachycardia bradycardia syndrome   Respiratory failure (HCC)     Past Surgical History:  Procedure Laterality Date   ANTERIOR FUSION LUMBAR SPINE     ATRIAL FIBRILLATION ABLATION N/A 09/12/2020   Procedure: ATRIAL FIBRILLATION ABLATION;  Surgeon: Hillis Range, MD;  Location: [facility] INVASIVE CV LAB;  Service: Cardiovascular;  Laterality: N/A;   ATRIAL FIBRILLATION ABLATION N/A 03/18/2022   Procedure: ATRIAL FIBRILLATION ABLATION;  Surgeon: Maurice Small, MD;  Location: [facility] INVASIVE CV LAB;  Service: Cardiovascular;  Laterality: N/A;   BIOPSY  08/27/2016   Procedure: BIOPSY;  Surgeon: Malissa Hippo, MD;  Location: [facility];  Service: Endoscopy;;  FOUR GASTRIC POLYPS BIOPSIED   CARDIOVERSION N/A 03/28/2014   Procedure: CARDIOVERSION;  Surgeon: Pricilla Riffle, MD;  Location: [facility] ORS;  Service: Cardiovascular;  Laterality: N/A;   CARDIOVERSION N/A 10/22/2016   Procedure: CARDIOVERSION;  Surgeon: Quintella Reichert, MD;  Location: [facility] ENDOSCOPY;  Service: Cardiovascular;  Laterality: N/A;   CARDIOVERSION N/A 11/26/2020   Procedure: CARDIOVERSION;  Surgeon: Pricilla Riffle, MD;  Location: [facility] ENDOSCOPY;  Service: Cardiovascular;  Laterality: N/A;   CATARACT EXTRACTION     COLONOSCOPY N/A 08/04/2012   Procedure: COLONOSCOPY;  Surgeon: Malissa Hippo, MD;  Location: [facility];  Service: Endoscopy;  Laterality: N/A;  730-rescheduled  to 1030 Ann notified pt   ELECTROPHYSIOLOGIC STUDY N/A 09/18/2014   Procedure: Atrial Fibrillation Ablation;  Surgeon: Hillis Range, MD;  Location: [facility] INVASIVE CV LAB;  Service: Cardiovascular;  Laterality: N/A;   ESOPHAGOGASTRODUODENOSCOPY N/A 08/27/2016   Procedure: ESOPHAGOGASTRODUODENOSCOPY (EGD);  Surgeon: Malissa Hippo, MD;  Location: [facility];  Service: Endoscopy;  Laterality: N/A;  1200   implantable loop recorder placement  11/22/2020   Medtronic Reveal Linq model LNQ 11 (SN [id number]) implantable loop recorder   LASIK     Both eyes   TEE WITHOUT CARDIOVERSION N/A 09/18/2014   Procedure: TRANSESOPHAGEAL ECHOCARDIOGRAM (TEE);  Surgeon: Laurey Morale, MD;  Location: [facility] ENDOSCOPY;  Service: Cardiovascular;  Laterality: N/A;   TEE WITHOUT CARDIOVERSION N/A 10/22/2016   Procedure: TRANSESOPHAGEAL ECHOCARDIOGRAM (TEE);  Surgeon: Quintella Reichert, MD;  Location: [facility] ENDOSCOPY;  Service: Cardiovascular;  Laterality: N/A;   TEE WITHOUT CARDIOVERSION N/A 08/24/2017   Procedure: TRANSESOPHAGEAL ECHOCARDIOGRAM (TEE);  Surgeon: Wendall Stade, MD;  Location: [facility] ENDOSCOPY;  Service: Cardiovascular;  Laterality: N/A;   TOTAL HIP ARTHROPLASTY  03/30/2008   Right    Current Outpatient Medications  Medication Sig Dispense Refill   antiseptic oral rinse (BIOTENE) LIQD 15 mLs by Mouth Rinse route daily as needed for dry mouth.     atorvastatin (LIPITOR) 10 MG tablet Take 10 mg by mouth daily at 6 PM.     Azelastine HCl 137 MCG/SPRAY SOLN PLACE TWO SPRAYS into BOTH nostrils TWICE DAILY. USE in each nostrils AS DIRECTED 30 mL 11   Biotin 1 MG CAPS Take 1,000 mg by mouth 3 (three) times a week.      budesonide (PULMICORT) 0.5 MG/2ML nebulizer solution Take 0.5 mg by nebulization daily.     Calcium Carbonate-Vit D-Min (QC CALCIUM-MAGNESIUM-ZINC-D3) 333.4-133 MG-UNIT TABS Take 1 tablet by mouth 2 (two) times daily.      cetirizine (ZYRTEC) 10 MG tablet Take 10 mg by mouth at bedtime as needed  for allergies.     clindamycin (CLEOCIN) 300 MG capsule Take 600 mg by mouth See admin instructions. Take prior to dental procedures  0   diltiazem (CARDIZEM CD) 120 MG 24 hr capsule Take 1 capsule (120 mg total) by mouth at bedtime. 90 capsule 1   diltiazem (CARDIZEM CD) 240 MG 24 hr capsule Take 1 capsule (240 mg total) by mouth every morning. 90 capsule 0   diltiazem (CARDIZEM) 30 MG tablet TAKE 1 TABLET BY MOUTH AS NEEDED FOR PALPITATIONS 45 tablet 2   dofetilide (TIKOSYN) 500 MCG capsule Take 1 capsule (500 mcg total) by mouth 2 (two) times daily. 180 capsule 1   famotidine (PEPCID) 20 MG tablet Take 2 tablets (40 mg total) by mouth at bedtime as needed. 30 tablet 1   fluticasone (FLONASE) 50 MCG/ACT nasal spray Place 1 spray into both nostrils 2 (two) times daily.     gabapentin (NEURONTIN) 100 MG capsule Take 1 capsule (100 mg total) by mouth at bedtime. 90 capsule 0   hydrALAZINE (APRESOLINE) 100 MG tablet Take 100 mg by mouth 2 (two) times daily.     HYDROcodone bit-homatropine (HYCODAN) 5-1.5 MG/5ML syrup Take 5 mLs by mouth every 6 (six) hours as needed for cough.     Hypertonic Nasal Wash (SINUS RINSE) PACK Place 1 each into the nose daily as needed (clear nasal passage). Neilmed sinus rinse     ipratropium-albuterol (DUONEB) 0.5-2.5 (3) MG/3ML SOLN Take 3 mLs by nebulization daily.     lactobacillus acidophilus (BACID) TABS tablet Take 1 tablet by mouth daily.     losartan (COZAAR) 100 MG tablet TAKE 1 TABLET BY MOUTH DAILY 90 tablet 1   Magnesium 250 MG TABS Take 250 mg by mouth daily.     montelukast (SINGULAIR) 10 MG tablet Take 10 mg by mouth at bedtime.     ondansetron (ZOFRAN-ODT) 4  MG disintegrating tablet Take 4 mg by mouth as needed.     pantoprazole (PROTONIX) 40 MG tablet Take 40 mg by mouth 2 (two) times daily.     Polyethyl Glyc-Propyl Glyc PF (SYSTANE ULTRA PF) 0.4-0.3 % SOLN Place 1 drop into both eyes as needed (dry eyes).     potassium chloride (KLOR-CON) 10 MEQ  tablet Take 1 tablet (10 mEq total) by mouth daily. 90 tablet 1   rivaroxaban (XARELTO) 20 MG TABS tablet Take 1 tablet (20 mg total) by mouth daily with supper. 90 tablet 3   spironolactone (ALDACTONE) 25 MG tablet Take 12.5 mg by mouth daily.     umeclidinium-vilanterol (ANORO ELLIPTA) 62.5-25 MCG/ACT AEPB INHALE ONE PUFF INTO THE LUNGS DAILY 180 each 3   Wheat Dextrin (BENEFIBER ON THE GO) POWD Take 10 mLs by mouth daily.     White Petrolatum-Mineral Oil (SYSTANE NIGHTTIME) OINT Place 1 drop into both eyes at bedtime.     Current Facility-Administered Medications  Medication Dose Route Frequency Provider Last Rate Last Admin   methylPREDNISolone acetate (DEPO-MEDROL) injection 80 mg  80 mg Intramuscular Once Jetty Duhamel D, MD        Allergies as of 05/11/2023 - Review Complete 05/11/2023  Allergen Reaction Noted   Bupropion hcl Other (See Comments)    Escitalopram oxalate Other (See Comments)    Fluticasone-salmeterol Other (See Comments)    Lisinopril Cough    Serevent Other (See Comments) 05/06/2011    Family History  Problem Relation Age of Onset   Cancer Mother    Heart attack Father    Colon cancer Other     Social History   Socioeconomic History   Marital status: Single    Spouse name: Not on file   Number of children: Not on file   Years of education: Not on file   Highest education level: Not on file  Occupational History   Occupation: Retired: Engineer, site  Tobacco Use   Smoking status: Never    Passive exposure: Past   Smokeless tobacco: Never  Vaping Use   Vaping status: Never Used  Substance and Sexual Activity   Alcohol use: No    Alcohol/week: 0.0 standard drinks of alcohol   Drug use: No   Sexual activity: Not on file  Other Topics Concern   Not on file  Social History Narrative   Pt lives in [place] alone. She was never married.   Retired Psychologist, occupational.   Attends [employer]   Social Drivers of Research scientist (physical sciences) Strain: Not on [employer] Insecurity: Not on file  Transportation Needs: Not on file  Physical Activity: Not on file  Stress: Not on file  Social Connections: Not on file    Review of systems General: negative for malaise, night sweats, fever, chills, weight loss Neck: Negative for lumps, goiter, pain and significant neck swelling Resp: Negative for cough, wheezing, dyspnea at rest CV: Negative for chest pain, leg swelling, palpitations, orthopnea GI: denies melena, hematochezia, nausea, vomiting, diarrhea, constipation, dysphagia, odyonophagia, early satiety or unintentional weight loss. +GERD/atypical cough  The remainder of the review of systems is noncontributory.  Physical Exam: BP 119/61   Pulse (!) 59   Temp (!) 97.1 F (36.2 C)   Ht 5' 1.5" (1.562 m)   Wt 195 lb 8 oz (88.7 kg)   BMI 36.34 kg/m  General:   Alert and oriented. No distress noted. Pleasant and cooperative.  Head:  Normocephalic  and atraumatic. Eyes:  Conjuctiva clear without scleral icterus. Mouth:  Oral mucosa pink and moist. Good dentition. No lesions. Heart: Normal rate and rhythm, s1 and s2 heart sounds present.  Lungs: Clear lung sounds in all lobes. Respirations equal and unlabored. Abdomen:  +BS, soft, non-tender and non-distended. No rebound or guarding. No HSM or masses noted. Neurologic:  Alert and  oriented x4 Psych:  Alert and cooperative. Normal mood and affect.  Invalid input(s): "6 MONTHS"   ASSESSMENT: [name] [name] is a 83 y.o. female presenting today for GERD/atypical cough and IDA   GERD/Atypical cough: Ongoing with consumption of foods over the past year or so, thought secondary possibly to GERD or commendation of GERD and sinus problems.  EGD has been discussed in the past though patient had declined.  She had SLP evaluation as outlined above without significant findings.  She was recently switched from omeprazole to Protonix but does not notice much improvement, denies  overt heartburn or acid regurgitation.  Patient is amenable to scheduling EGD at this time for further evaluation of her symptoms.   IDA: History of mild anemia with hemoglobin range 10-11 for the past 4+ years per chart review.  Notably patient was started on p.o. iron by PCP in May 2024 which she took for about 3 months.  She denied rectal bleeding or melena.  She was recommended to have iron studies done after her last visit in October though unclear if these were completed or not.  We will reach out to patient's PCP office where she typically has her labs done to confirm whether they have iron studies or not.  Will have her complete iron studies if these were not done in October.  I discussed with the patient that if iron studies show iron deficiency will need to proceed with upper endoscopy and colonoscopy as well for further evaluation of source of bleeding in her GI tract.  Given the above, will await the results of previous iron studies or have patient complete iron studies now, we will reach out to her once iron study results are reviewed to schedule either just EGD or EGD and colonoscopy if IDA present. Indications, risks and benefits of procedure discussed in detail with patient. Patient verbalized understanding and is in agreement to proceed with EGD +/-colonoscopy.   PLAN:  -continue Protonix 40mg  BID -check on iron studies/complete these if not done -schedule EGD only if iron levels good, EGD +Colonoscopy if labs show iron deficiency (will need cardiac clearance prior to)  All questions were answered, patient verbalized understanding and is in agreement with plan as outlined above.   Follow Up: 4 months   Kendy Haston L. Jeanmarie Hubert, MSN, APRN, AGNP-C Adult-Gerontology Nurse Practitioner [facility]  I have reviewed the note and agree with the APP's assessment as described in this progress note  If negative workup, will need to referred for evaluation by ENT.   Depending on findings on EGD, may decrease the Protonix dosage to once a day as she has not presented to significant relief of symptoms with twice daily dosing.  Katrinka Blazing, MD Gastroenterology and Hepatology [facility]

## 2023-05-11 NOTE — Patient Instructions (Signed)
-  continue Protonix 40mg  twice daily -We will check to see if iron studies were done at Dr. Margaretmary Eddy office, if they are not we will let you know to have these done -If iron studies show low iron we will reach you to schedule EGD and colonoscopy however if iron studies are normal we will reach out and schedule only upper endoscopy.  Follow-up 4 months  It was a pleasure to see you today. I want to create trusting relationships with patients and provide genuine, compassionate, and quality care. I truly value your feedback! please be on the lookout for a survey regarding your visit with me today. I appreciate your input about our visit and your time in completing this!    [name] Andon L. Jeanmarie Hubert, MSN, APRN, AGNP-C Adult-Gerontology Nurse Practitioner [facility] at [facility]

## 2023-05-11 NOTE — Progress Notes (Addendum)
Referring Provider: Kirstie Peri, MD Primary Care Physician:  Kirstie Peri, MD Primary GI Physician: Dr. Levon Hedger   Chief Complaint  Patient presents with   Gastroesophageal Reflux    Follow up on GERD. Still having some coughing when eating. Feels like a tickle in throat when eating anything dry.  Taking protonix 40mg  bid. Since taking some antibiotics for strep states it has helped her symtpoms.    HPI:   [name] [name] is a 83 y.o. female with past medical history of anxiety, asthma, A flutter, COPD, depression, HTN, HLD, OSA.     Patient presenting today for follow up of GERD/atypical cough   Last seen October 2024, at that time patient reported some continued coughing when she eats or drinks though this has improved from previously.  Denied any dysphagia.  Is interested in swallow study with SLP though did not want to do it at current time.  Maintained on omeprazole 40 mg daily and famotidine 20 mg as needed which seems to keep her GERD symptoms controlled.  Patient did report iron pills that were sent by PCP were stopped as she ran out.  Patient advised to continue omeprazole 40 mg daily, continue famotidine 20 mg nightly as needed, H&H and iron studies, consider MBSS with SLP, EGD and colonoscopy if iron deficiency noted.  Labs in January with hemoglobin 11.2 though does not appear iron studies were ever completed.  Patient did undergo SLP swallow study 02/22/2023 which showed normal oropharyngeal swallow with prompt swallow trigger/initiation at the level of the valleculae, adequate hyolaryngeal excursion, no penetration/aspiration, and no significant residuals post swallow. Pt does have suspected cervical osteophytes and prominent cricopharyngeus, which did not impede bolus flow during this study. Esophageal sweep revealed some delayed distal esophageal emptying with some retrograde movement, but did eventually clear. This study was reviewed with Pt. While we were talking, she  had several strong coughing episodes and then expectorated clear phlegm. Pt also reports a change in her singing range. She was given written reflux precautions and encouraged to replace coughing episodes with a sip of water or dry swallow when possible (to avoid excessive strain on her vocal folds). Pt already elevates the head of her bed and consumes frequent, smaller meals. She indicates that she notes increased coughing with cornbread and dry crackers, so SLP suggested adding small amount of moisture (she also has xerostomia). Recommend regular textures, thin liquids via cup/straw, and PO medications whole with water. No further SLP services indicated at this time.  Recommended possible ENT referral if symptoms persisted.  Present: patient states she has had strep throat for about 3 weeks, she has had multiple rounds of steroids and antibiotics. She is feeling much better today. She also reports some improvement in her coughing since being on steroids/antibiotics over the past few weeks. She denies dysphagia but can sometimes feel her food passing when it hits the back of her throat and in upper chest. Notes more sinus drainage on the left side and a lot of irritation on the left side of her throat but this seems some better.   PCP changed her omeprazole to protonix 40mg  BID but she is not sure this has made much difference though she has not really had a lot of GERD symptoms even prior to this change.    Last colonoscopy: 06/09/12 Examination performed to cecum. Few small diverticula at sigmoid colon. Small external hemorrhoids. Last Endoscopy:2018 negative - Multiple whitish plaques in the upper third of  the esophagus. Brushing obtained for KOH.                           - Normal middle third of esophagus, lower third of                            esophagus and gastroesophageal junction.                           - Z-line regular, 39 cm from the incisors.                            - 2 cm hiatal hernia.                           - Multiple gastric polyps. Four biopsied.                           - Normal duodenal bulb and second portion of the                            duodenum.   Past Medical History:  Diagnosis Date   Allergic rhinitis    Anxiety    Asthma    Atrial flutter (HCC)    COPD (chronic obstructive pulmonary disease) (HCC)    Depression    Essential hypertension    Hyperlipidemia    Lumbar disc disease    Obesity    OSA (obstructive sleep apnea)    CPAP   Paroxysmal atrial fibrillation (HCC)    Element of tachycardia bradycardia syndrome   Respiratory failure (HCC)     Past Surgical History:  Procedure Laterality Date   ANTERIOR FUSION LUMBAR SPINE     ATRIAL FIBRILLATION ABLATION N/A 09/12/2020   Procedure: ATRIAL FIBRILLATION ABLATION;  Surgeon: Hillis Range, MD;  Location: [facility] INVASIVE CV LAB;  Service: Cardiovascular;  Laterality: N/A;   ATRIAL FIBRILLATION ABLATION N/A 03/18/2022   Procedure: ATRIAL FIBRILLATION ABLATION;  Surgeon: Maurice Small, MD;  Location: [facility] INVASIVE CV LAB;  Service: Cardiovascular;  Laterality: N/A;   BIOPSY  08/27/2016   Procedure: BIOPSY;  Surgeon: Malissa Hippo, MD;  Location: [facility];  Service: Endoscopy;;  FOUR GASTRIC POLYPS BIOPSIED   CARDIOVERSION N/A 03/28/2014   Procedure: CARDIOVERSION;  Surgeon: Pricilla Riffle, MD;  Location: [facility] ORS;  Service: Cardiovascular;  Laterality: N/A;   CARDIOVERSION N/A 10/22/2016   Procedure: CARDIOVERSION;  Surgeon: Quintella Reichert, MD;  Location: [facility] ENDOSCOPY;  Service: Cardiovascular;  Laterality: N/A;   CARDIOVERSION N/A 11/26/2020   Procedure: CARDIOVERSION;  Surgeon: Pricilla Riffle, MD;  Location: [facility] ENDOSCOPY;  Service: Cardiovascular;  Laterality: N/A;   CATARACT EXTRACTION     COLONOSCOPY N/A 08/04/2012   Procedure: COLONOSCOPY;  Surgeon: Malissa Hippo, MD;  Location: [facility];  Service: Endoscopy;  Laterality: N/A;  730-rescheduled  to 1030 Ann notified pt   ELECTROPHYSIOLOGIC STUDY N/A 09/18/2014   Procedure: Atrial Fibrillation Ablation;  Surgeon: Hillis Range, MD;  Location: [facility] INVASIVE CV LAB;  Service: Cardiovascular;  Laterality: N/A;   ESOPHAGOGASTRODUODENOSCOPY N/A 08/27/2016   Procedure: ESOPHAGOGASTRODUODENOSCOPY (EGD);  Surgeon: Malissa Hippo, MD;  Location: [facility];  Service: Endoscopy;  Laterality: N/A;  1200   implantable loop recorder placement  11/22/2020   Medtronic Reveal Linq model LNQ 11 (SN [id number]) implantable loop recorder   LASIK     Both eyes   TEE WITHOUT CARDIOVERSION N/A 09/18/2014   Procedure: TRANSESOPHAGEAL ECHOCARDIOGRAM (TEE);  Surgeon: Laurey Morale, MD;  Location: [facility] ENDOSCOPY;  Service: Cardiovascular;  Laterality: N/A;   TEE WITHOUT CARDIOVERSION N/A 10/22/2016   Procedure: TRANSESOPHAGEAL ECHOCARDIOGRAM (TEE);  Surgeon: Quintella Reichert, MD;  Location: [facility] ENDOSCOPY;  Service: Cardiovascular;  Laterality: N/A;   TEE WITHOUT CARDIOVERSION N/A 08/24/2017   Procedure: TRANSESOPHAGEAL ECHOCARDIOGRAM (TEE);  Surgeon: Wendall Stade, MD;  Location: [facility] ENDOSCOPY;  Service: Cardiovascular;  Laterality: N/A;   TOTAL HIP ARTHROPLASTY  03/30/2008   Right    Current Outpatient Medications  Medication Sig Dispense Refill   antiseptic oral rinse (BIOTENE) LIQD 15 mLs by Mouth Rinse route daily as needed for dry mouth.     atorvastatin (LIPITOR) 10 MG tablet Take 10 mg by mouth daily at 6 PM.     Azelastine HCl 137 MCG/SPRAY SOLN PLACE TWO SPRAYS into BOTH nostrils TWICE DAILY. USE in each nostrils AS DIRECTED 30 mL 11   Biotin 1 MG CAPS Take 1,000 mg by mouth 3 (three) times a week.      budesonide (PULMICORT) 0.5 MG/2ML nebulizer solution Take 0.5 mg by nebulization daily.     Calcium Carbonate-Vit D-Min (QC CALCIUM-MAGNESIUM-ZINC-D3) 333.4-133 MG-UNIT TABS Take 1 tablet by mouth 2 (two) times daily.      cetirizine (ZYRTEC) 10 MG tablet Take 10 mg by mouth at bedtime as needed  for allergies.     clindamycin (CLEOCIN) 300 MG capsule Take 600 mg by mouth See admin instructions. Take prior to dental procedures  0   diltiazem (CARDIZEM CD) 120 MG 24 hr capsule Take 1 capsule (120 mg total) by mouth at bedtime. 90 capsule 1   diltiazem (CARDIZEM CD) 240 MG 24 hr capsule Take 1 capsule (240 mg total) by mouth every morning. 90 capsule 0   diltiazem (CARDIZEM) 30 MG tablet TAKE 1 TABLET BY MOUTH AS NEEDED FOR PALPITATIONS 45 tablet 2   dofetilide (TIKOSYN) 500 MCG capsule Take 1 capsule (500 mcg total) by mouth 2 (two) times daily. 180 capsule 1   famotidine (PEPCID) 20 MG tablet Take 2 tablets (40 mg total) by mouth at bedtime as needed. 30 tablet 1   fluticasone (FLONASE) 50 MCG/ACT nasal spray Place 1 spray into both nostrils 2 (two) times daily.     gabapentin (NEURONTIN) 100 MG capsule Take 1 capsule (100 mg total) by mouth at bedtime. 90 capsule 0   hydrALAZINE (APRESOLINE) 100 MG tablet Take 100 mg by mouth 2 (two) times daily.     HYDROcodone bit-homatropine (HYCODAN) 5-1.5 MG/5ML syrup Take 5 mLs by mouth every 6 (six) hours as needed for cough.     Hypertonic Nasal Wash (SINUS RINSE) PACK Place 1 each into the nose daily as needed (clear nasal passage). Neilmed sinus rinse     ipratropium-albuterol (DUONEB) 0.5-2.5 (3) MG/3ML SOLN Take 3 mLs by nebulization daily.     lactobacillus acidophilus (BACID) TABS tablet Take 1 tablet by mouth daily.     losartan (COZAAR) 100 MG tablet TAKE 1 TABLET BY MOUTH DAILY 90 tablet 1   Magnesium 250 MG TABS Take 250 mg by mouth daily.     montelukast (SINGULAIR) 10 MG tablet Take 10 mg by mouth at bedtime.     ondansetron (ZOFRAN-ODT) 4  MG disintegrating tablet Take 4 mg by mouth as needed.     pantoprazole (PROTONIX) 40 MG tablet Take 40 mg by mouth 2 (two) times daily.     Polyethyl Glyc-Propyl Glyc PF (SYSTANE ULTRA PF) 0.4-0.3 % SOLN Place 1 drop into both eyes as needed (dry eyes).     potassium chloride (KLOR-CON) 10 MEQ  tablet Take 1 tablet (10 mEq total) by mouth daily. 90 tablet 1   rivaroxaban (XARELTO) 20 MG TABS tablet Take 1 tablet (20 mg total) by mouth daily with supper. 90 tablet 3   spironolactone (ALDACTONE) 25 MG tablet Take 12.5 mg by mouth daily.     umeclidinium-vilanterol (ANORO ELLIPTA) 62.5-25 MCG/ACT AEPB INHALE ONE PUFF INTO THE LUNGS DAILY 180 each 3   Wheat Dextrin (BENEFIBER ON THE GO) POWD Take 10 mLs by mouth daily.     White Petrolatum-Mineral Oil (SYSTANE NIGHTTIME) OINT Place 1 drop into both eyes at bedtime.     Current Facility-Administered Medications  Medication Dose Route Frequency Provider Last Rate Last Admin   methylPREDNISolone acetate (DEPO-MEDROL) injection 80 mg  80 mg Intramuscular Once Jetty Duhamel D, MD        Allergies as of 05/11/2023 - Review Complete 05/11/2023  Allergen Reaction Noted   Bupropion hcl Other (See Comments)    Escitalopram oxalate Other (See Comments)    Fluticasone-salmeterol Other (See Comments)    Lisinopril Cough    Serevent Other (See Comments) 05/06/2011    Family History  Problem Relation Age of Onset   Cancer Mother    Heart attack Father    Colon cancer Other     Social History   Socioeconomic History   Marital status: Single    Spouse name: Not on file   Number of children: Not on file   Years of education: Not on file   Highest education level: Not on file  Occupational History   Occupation: Retired: Engineer, site  Tobacco Use   Smoking status: Never    Passive exposure: Past   Smokeless tobacco: Never  Vaping Use   Vaping status: Never Used  Substance and Sexual Activity   Alcohol use: No    Alcohol/week: 0.0 standard drinks of alcohol   Drug use: No   Sexual activity: Not on file  Other Topics Concern   Not on file  Social History Narrative   Pt lives in [place] alone. She was never married.   Retired Psychologist, occupational.   Attends [employer]   Social Drivers of Research scientist (physical sciences) Strain: Not on [employer] Insecurity: Not on file  Transportation Needs: Not on file  Physical Activity: Not on file  Stress: Not on file  Social Connections: Not on file    Review of systems General: negative for malaise, night sweats, fever, chills, weight loss Neck: Negative for lumps, goiter, pain and significant neck swelling Resp: Negative for cough, wheezing, dyspnea at rest CV: Negative for chest pain, leg swelling, palpitations, orthopnea GI: denies melena, hematochezia, nausea, vomiting, diarrhea, constipation, dysphagia, odyonophagia, early satiety or unintentional weight loss. +GERD/atypical cough  The remainder of the review of systems is noncontributory.  Physical Exam: BP 119/61   Pulse (!) 59   Temp (!) 97.1 F (36.2 C)   Ht 5' 1.5" (1.562 m)   Wt 195 lb 8 oz (88.7 kg)   BMI 36.34 kg/m  General:   Alert and oriented. No distress noted. Pleasant and cooperative.  Head:  Normocephalic  and atraumatic. Eyes:  Conjuctiva clear without scleral icterus. Mouth:  Oral mucosa pink and moist. Good dentition. No lesions. Heart: Normal rate and rhythm, s1 and s2 heart sounds present.  Lungs: Clear lung sounds in all lobes. Respirations equal and unlabored. Abdomen:  +BS, soft, non-tender and non-distended. No rebound or guarding. No HSM or masses noted. Neurologic:  Alert and  oriented x4 Psych:  Alert and cooperative. Normal mood and affect.  Invalid input(s): "6 MONTHS"   ASSESSMENT: [name] [name] is a 83 y.o. female presenting today for GERD/atypical cough and IDA   GERD/Atypical cough: Ongoing with consumption of foods over the past year or so, thought secondary possibly to GERD or commendation of GERD and sinus problems.  EGD has been discussed in the past though patient had declined.  She had SLP evaluation as outlined above without significant findings.  She was recently switched from omeprazole to Protonix but does not notice much improvement, denies  overt heartburn or acid regurgitation.  Patient is amenable to scheduling EGD at this time for further evaluation of her symptoms.   IDA: History of mild anemia with hemoglobin range 10-11 for the past 4+ years per chart review.  Notably patient was started on p.o. iron by PCP in May 2024 which she took for about 3 months.  She denied rectal bleeding or melena.  She was recommended to have iron studies done after her last visit in October though unclear if these were completed or not.  We will reach out to patient's PCP office where she typically has her labs done to confirm whether they have iron studies or not.  Will have her complete iron studies if these were not done in October.  I discussed with the patient that if iron studies show iron deficiency will need to proceed with upper endoscopy and colonoscopy as well for further evaluation of source of bleeding in her GI tract.  Given the above, will await the results of previous iron studies or have patient complete iron studies now, we will reach out to her once iron study results are reviewed to schedule either just EGD or EGD and colonoscopy if IDA present. Indications, risks and benefits of procedure discussed in detail with patient. Patient verbalized understanding and is in agreement to proceed with EGD +/-colonoscopy.   PLAN:  -continue Protonix 40mg  BID -check on iron studies/complete these if not done -schedule EGD only if iron levels good, EGD +Colonoscopy if labs show iron deficiency (will need cardiac clearance prior to)  All questions were answered, patient verbalized understanding and is in agreement with plan as outlined above.   Follow Up: 4 months   Kendy Haston L. Jeanmarie Hubert, MSN, APRN, AGNP-C Adult-Gerontology Nurse Practitioner [facility]  I have reviewed the note and agree with the APP's assessment as described in this progress note  If negative workup, will need to referred for evaluation by ENT.   Depending on findings on EGD, may decrease the Protonix dosage to once a day as she has not presented to significant relief of symptoms with twice daily dosing.  Katrinka Blazing, MD Gastroenterology and Hepatology [facility]

## 2023-05-11 NOTE — Telephone Encounter (Signed)
Dr Diona Browner agrees with dose decrease to 15mg  daily.  Pt called and notified and she understands.  New Rx sent to [facility].

## 2023-05-11 NOTE — Telephone Encounter (Signed)
Prescription refill request for Xarelto received.  Indication: AF Last office visit: 04/19/23  Ival Bible MD Weight: 88.7kg Age: 83 Scr: 1.24 on 04/19/23  Epic CrCl:  48.14  Pt is taking Xarelto 20mg  daily.  Based on above findings Xarelto 15mg  daily is the appropriate dose due to CrCl.  Message sent to Dr Diona Browner for recommendation on dose adjustment.

## 2023-05-12 ENCOUNTER — Telehealth (INDEPENDENT_AMBULATORY_CARE_PROVIDER_SITE_OTHER): Payer: Self-pay | Admitting: Gastroenterology

## 2023-05-12 DIAGNOSIS — J029 Acute pharyngitis, unspecified: Secondary | ICD-10-CM | POA: Diagnosis not present

## 2023-05-12 DIAGNOSIS — J449 Chronic obstructive pulmonary disease, unspecified: Secondary | ICD-10-CM | POA: Diagnosis not present

## 2023-05-12 DIAGNOSIS — I10 Essential (primary) hypertension: Secondary | ICD-10-CM | POA: Diagnosis not present

## 2023-05-12 DIAGNOSIS — H6122 Impacted cerumen, left ear: Secondary | ICD-10-CM | POA: Diagnosis not present

## 2023-05-12 DIAGNOSIS — Z299 Encounter for prophylactic measures, unspecified: Secondary | ICD-10-CM | POA: Diagnosis not present

## 2023-05-12 NOTE — Progress Notes (Signed)
[facility] Summary Report / Loop Recorder

## 2023-05-12 NOTE — Telephone Encounter (Signed)
[name] James, NP  Metro Kung, LPN; Marlowe Shores, LPN Labs actually show elevated iron and normal hemoglobin. Can we confirm she is not taking Iron pills anymore?  At this time, can just proceed with EGD, ASA III for atypical cough/gerd/odynophagia She will need cardiac clearance to hold her xarelto. (Dr. Salena Saner)

## 2023-05-12 NOTE — Telephone Encounter (Signed)
Dose decreased to 15mg  per Dr Diona Browner

## 2023-05-13 ENCOUNTER — Telehealth (INDEPENDENT_AMBULATORY_CARE_PROVIDER_SITE_OTHER): Payer: Self-pay | Admitting: Gastroenterology

## 2023-05-13 NOTE — Telephone Encounter (Signed)
    05/13/23  [name] [name] July 18, 1940  What type of surgery is being performed? EGD  When is surgery scheduled? TBD  What type of clearance is required (medical or pharmacy to hold medication or both? Pharmacy to hold med  Are there any medications that need to be held prior to surgery and how long? Xarelto to be held 2 days prior to procedure  Name of physician performing surgery?  Dr. Katrinka Blazing [facility] at [facility] Phone: [phone] Fax: [phone]  Anethesia type (none, local, MAC, general)? MAC

## 2023-05-13 NOTE — Telephone Encounter (Signed)
Pt ok to hold off on TCS at this time? Pt would like to know. Thank you

## 2023-05-13 NOTE — Telephone Encounter (Signed)
Pt called back in to office. Pt scheduled for 06/08/23. Instructions will be sent once pre op is received.  Pt states she is not taking Iron at this time. Dr.Shah faxed over lab results (under Media from 05/11/23). Pt also had blood work orders from [place] in October and pt is wanting to know if provider wants her to do that blood work.

## 2023-05-13 NOTE — Telephone Encounter (Signed)
Spoke with patient;she was in the grocery store and not near her calendar. Pt will call back once she arrives back home.  Clearance sent to cardiology

## 2023-05-13 NOTE — Telephone Encounter (Signed)
Pt contacted and verbalized understanding.

## 2023-05-14 NOTE — Telephone Encounter (Signed)
Patient with diagnosis of atrial fibrillation on Xarelto for anticoagulation.    What type of surgery is being performed? EGD   When is surgery scheduled? TBD   CHA2DS2-VASc Score = 5   This indicates a 7.2% annual risk of stroke. The patient's score is based upon: CHF History: 0 HTN History: 1 Diabetes History: 0 Stroke History: 0 Vascular Disease History: 1 Age Score: 2 Gender Score: 1   CrCl 48 Platelet count 224  Per office protocol, patient can hold Xarelto for 2 days prior to procedure.   Patient will not need bridging with Lovenox (enoxaparin) around procedure.  **This guidance is not considered finalized until pre-operative APP has relayed final recommendations.**

## 2023-05-14 NOTE — Telephone Encounter (Signed)
   Patient Name: [name] [name]  DOB: 27-Jun-1940 MRN: [id number]  Primary Cardiologist: Nona Dell, MD  Clinical pharmacists have reviewed the patient's past medical history, labs, and current medications as part of preoperative protocol coverage. The following recommendations have been made:  Per office protocol, patient can hold Xarelto for 2 days prior to procedure.   Patient will not need bridging with Lovenox (enoxaparin) around procedure.   I will route this recommendation to the requesting party via Epic fax function and remove from pre-op pool.  Please call with questions.  Napoleon Form, Leodis Rains, NP 05/14/2023, 8:12 AM

## 2023-05-20 DIAGNOSIS — M6281 Muscle weakness (generalized): Secondary | ICD-10-CM | POA: Diagnosis not present

## 2023-05-20 DIAGNOSIS — M79673 Pain in unspecified foot: Secondary | ICD-10-CM | POA: Diagnosis not present

## 2023-05-20 DIAGNOSIS — M545 Low back pain, unspecified: Secondary | ICD-10-CM | POA: Diagnosis not present

## 2023-05-24 ENCOUNTER — Encounter: Payer: Medicare Other | Attending: Internal Medicine | Admitting: *Deleted

## 2023-05-24 VITALS — BP 139/52 | HR 64 | Resp 20

## 2023-05-24 DIAGNOSIS — J4520 Mild intermittent asthma, uncomplicated: Secondary | ICD-10-CM | POA: Insufficient documentation

## 2023-05-24 MED ORDER — MEPOLIZUMAB 100 MG ~~LOC~~ SOLR
100.0000 mg | Freq: Once | SUBCUTANEOUS | Status: AC
  Administered 2023-05-24: 100 mg via SUBCUTANEOUS

## 2023-05-24 NOTE — Progress Notes (Signed)
 Diagnosis: Asthma  Provider:   Jetty Duhamel, MD  Procedure: Injection  Nucala (Mepolizumab), Dose: 100 mg, Site: subcutaneous, Number of injections: 1  Injection Site(s): Right arm  Post Care: Observation period completed  Discharge: Condition: Good, Destination: Home . AVS Provided  Performed by:  Daleen Squibb, RN

## 2023-05-25 ENCOUNTER — Encounter: Payer: Self-pay | Admitting: Cardiovascular Disease

## 2023-05-26 ENCOUNTER — Other Ambulatory Visit (HOSPITAL_COMMUNITY): Payer: Self-pay | Admitting: Cardiology

## 2023-05-26 DIAGNOSIS — M545 Low back pain, unspecified: Secondary | ICD-10-CM | POA: Diagnosis not present

## 2023-05-26 DIAGNOSIS — M79673 Pain in unspecified foot: Secondary | ICD-10-CM | POA: Diagnosis not present

## 2023-05-26 DIAGNOSIS — M6281 Muscle weakness (generalized): Secondary | ICD-10-CM | POA: Diagnosis not present

## 2023-05-28 DIAGNOSIS — I10 Essential (primary) hypertension: Secondary | ICD-10-CM | POA: Diagnosis not present

## 2023-05-28 DIAGNOSIS — M545 Low back pain, unspecified: Secondary | ICD-10-CM | POA: Diagnosis not present

## 2023-05-28 DIAGNOSIS — M6281 Muscle weakness (generalized): Secondary | ICD-10-CM | POA: Diagnosis not present

## 2023-05-28 DIAGNOSIS — M79673 Pain in unspecified foot: Secondary | ICD-10-CM | POA: Diagnosis not present

## 2023-06-02 DIAGNOSIS — M79673 Pain in unspecified foot: Secondary | ICD-10-CM | POA: Diagnosis not present

## 2023-06-02 DIAGNOSIS — M545 Low back pain, unspecified: Secondary | ICD-10-CM | POA: Diagnosis not present

## 2023-06-02 DIAGNOSIS — Z23 Encounter for immunization: Secondary | ICD-10-CM | POA: Diagnosis not present

## 2023-06-02 DIAGNOSIS — M6281 Muscle weakness (generalized): Secondary | ICD-10-CM | POA: Diagnosis not present

## 2023-06-03 ENCOUNTER — Encounter (HOSPITAL_COMMUNITY): Admission: RE | Admit: 2023-06-03 | Payer: Medicare Other | Source: Ambulatory Visit

## 2023-06-04 DIAGNOSIS — M6281 Muscle weakness (generalized): Secondary | ICD-10-CM | POA: Diagnosis not present

## 2023-06-04 DIAGNOSIS — M79673 Pain in unspecified foot: Secondary | ICD-10-CM | POA: Diagnosis not present

## 2023-06-04 DIAGNOSIS — M545 Low back pain, unspecified: Secondary | ICD-10-CM | POA: Diagnosis not present

## 2023-06-08 ENCOUNTER — Ambulatory Visit: Payer: Medicare Other | Admitting: Podiatry

## 2023-06-08 ENCOUNTER — Encounter: Payer: Self-pay | Admitting: Podiatry

## 2023-06-08 ENCOUNTER — Ambulatory Visit (HOSPITAL_COMMUNITY): Admitting: Anesthesiology

## 2023-06-08 ENCOUNTER — Encounter (HOSPITAL_COMMUNITY)
Admission: RE | Disposition: A | Discharge status: Home / Self Care | Payer: Self-pay | Source: Home / Self Care | Attending: Gastroenterology

## 2023-06-08 ENCOUNTER — Ambulatory Visit (HOSPITAL_COMMUNITY)
Admission: RE | Admit: 2023-06-08 | Discharge: 2023-06-08 | Disposition: A | Discharge status: Home / Self Care | Payer: Medicare Other | Attending: Gastroenterology | Admitting: Gastroenterology

## 2023-06-08 VITALS — Ht 61.5 in | Wt 191.5 lb

## 2023-06-08 DIAGNOSIS — M79674 Pain in right toe(s): Secondary | ICD-10-CM | POA: Diagnosis not present

## 2023-06-08 DIAGNOSIS — B351 Tinea unguium: Secondary | ICD-10-CM | POA: Diagnosis not present

## 2023-06-08 DIAGNOSIS — I2510 Atherosclerotic heart disease of native coronary artery without angina pectoris: Secondary | ICD-10-CM | POA: Insufficient documentation

## 2023-06-08 DIAGNOSIS — Z8249 Family history of ischemic heart disease and other diseases of the circulatory system: Secondary | ICD-10-CM | POA: Diagnosis not present

## 2023-06-08 DIAGNOSIS — K259 Gastric ulcer, unspecified as acute or chronic, without hemorrhage or perforation: Secondary | ICD-10-CM | POA: Diagnosis not present

## 2023-06-08 DIAGNOSIS — K317 Polyp of stomach and duodenum: Secondary | ICD-10-CM | POA: Diagnosis not present

## 2023-06-08 DIAGNOSIS — D509 Iron deficiency anemia, unspecified: Secondary | ICD-10-CM | POA: Diagnosis not present

## 2023-06-08 DIAGNOSIS — G4733 Obstructive sleep apnea (adult) (pediatric): Secondary | ICD-10-CM | POA: Insufficient documentation

## 2023-06-08 DIAGNOSIS — K3189 Other diseases of stomach and duodenum: Secondary | ICD-10-CM | POA: Diagnosis not present

## 2023-06-08 DIAGNOSIS — I10 Essential (primary) hypertension: Secondary | ICD-10-CM | POA: Diagnosis not present

## 2023-06-08 DIAGNOSIS — K219 Gastro-esophageal reflux disease without esophagitis: Secondary | ICD-10-CM | POA: Diagnosis not present

## 2023-06-08 DIAGNOSIS — Z79899 Other long term (current) drug therapy: Secondary | ICD-10-CM | POA: Diagnosis not present

## 2023-06-08 DIAGNOSIS — M79675 Pain in left toe(s): Secondary | ICD-10-CM | POA: Diagnosis not present

## 2023-06-08 DIAGNOSIS — J4489 Other specified chronic obstructive pulmonary disease: Secondary | ICD-10-CM | POA: Diagnosis not present

## 2023-06-08 DIAGNOSIS — D649 Anemia, unspecified: Secondary | ICD-10-CM

## 2023-06-08 DIAGNOSIS — R1310 Dysphagia, unspecified: Secondary | ICD-10-CM | POA: Diagnosis not present

## 2023-06-08 HISTORY — PX: ESOPHAGOGASTRODUODENOSCOPY (EGD) WITH PROPOFOL: SHX5813

## 2023-06-08 LAB — CBC
HCT: 34.6 % — ABNORMAL LOW (ref 36.0–46.0)
Hemoglobin: 11.2 g/dL — ABNORMAL LOW (ref 12.0–15.0)
MCH: 30 pg (ref 26.0–34.0)
MCHC: 32.4 g/dL (ref 30.0–36.0)
MCV: 92.8 fL (ref 80.0–100.0)
Platelets: 211 10*3/uL (ref 150–400)
RBC: 3.73 MIL/uL — ABNORMAL LOW (ref 3.87–5.11)
RDW: 14.2 % (ref 11.5–15.5)
WBC: 8.3 10*3/uL (ref 4.0–10.5)
nRBC: 0 % (ref 0.0–0.2)

## 2023-06-08 LAB — IRON AND TIBC
Iron: 74 ug/dL (ref 28–170)
Saturation Ratios: 22 % (ref 10.4–31.8)
TIBC: 344 ug/dL (ref 250–450)
UIBC: 270 ug/dL

## 2023-06-08 LAB — FERRITIN: Ferritin: 112 ng/mL (ref 11–307)

## 2023-06-08 SURGERY — ESOPHAGOGASTRODUODENOSCOPY (EGD) WITH PROPOFOL
Anesthesia: Choice

## 2023-06-08 MED ORDER — IPRATROPIUM-ALBUTEROL 0.5-2.5 (3) MG/3ML IN SOLN
3.0000 mL | Freq: Once | RESPIRATORY_TRACT | Status: AC
  Administered 2023-06-08: 3 mL via RESPIRATORY_TRACT

## 2023-06-08 MED ORDER — LACTATED RINGERS IV SOLN: INTRAVENOUS | Status: DC

## 2023-06-08 MED ORDER — IPRATROPIUM-ALBUTEROL 0.5-2.5 (3) MG/3ML IN SOLN
RESPIRATORY_TRACT | Status: AC
  Filled 2023-06-08: qty 3

## 2023-06-08 MED ORDER — SODIUM CHLORIDE (PF) 0.9 % IJ SOLN
PREFILLED_SYRINGE | INTRAMUSCULAR | Status: DC | PRN
  Administered 2023-06-08: 4 mL

## 2023-06-08 MED ORDER — PROPOFOL 10 MG/ML IV BOLUS
INTRAVENOUS | Status: DC | PRN
  Administered 2023-06-08 (×6): 20 mg via INTRAVENOUS
  Administered 2023-06-08: 50 mg via INTRAVENOUS
  Administered 2023-06-08: 20 mg via INTRAVENOUS
  Administered 2023-06-08: 40 mg via INTRAVENOUS
  Administered 2023-06-08 (×3): 20 mg via INTRAVENOUS

## 2023-06-08 NOTE — Op Note (Signed)
 [facility] Patient Name: [name] [name] Procedure Date: 06/08/2023 11:54 AM MRN: [id number] Date of Birth: 14-Oct-1940 Attending MD: Katrinka Blazing , , [id number] CSN: [id number] Age: 83 Admit Type: Outpatient Procedure:                Upper GI endoscopy Indications:              Iron deficiency anemia, Choking Providers:                Katrinka Blazing, Nena Polio, RN, Pandora Leiter,                            Technician Referring MD:              Medicines:                Monitored Anesthesia Care Complications:            No immediate complications. Estimated Blood Loss:     Estimated blood loss: none. Procedure:                Pre-Anesthesia Assessment:                           - Prior to the procedure, a History and Physical                            was performed, and patient medications, allergies                            and sensitivities were reviewed. The patient's                            tolerance of previous anesthesia was reviewed.                           - The risks and benefits of the procedure and the                            sedation options and risks were discussed with the                            patient. All questions were answered and informed                            consent was obtained.                           After obtaining informed consent, the endoscope was                            passed under direct vision. Throughout the                            procedure, the patient's blood pressure, pulse, and                            oxygen saturations were monitored continuously. The  [id number]-H190 ([id number]) scope was introduced through the                            mouth, and advanced to the second part of duodenum.                            The upper GI endoscopy was accomplished without                            difficulty. The patient tolerated the procedure                            well. Scope In:  12:30:47 PM Scope Out: 1:09:20 PM Total Procedure Duration: 0 hours 38 minutes 33 seconds  Findings:      The examined esophagus was normal.      Multiple 4 to 10 mm pedunculated and sessile polyps with sponatenous       bleeding were found in the gastric fundus and in the gastric body. Nine       of these polyps were removed with a hot snare. Resection and retrieval       were complete. To prevent bleeding after the polypectomy, three       hemostatic clips were successfully placed (MR safe) - two in one       polpypectomy site and one Ultra clip in a second site. This specific       area was injected with epinephrine 4 cc 1:10,000 dilution to provide       adequate hemostasis. Clip manufacturer: [employer]. There was no       bleeding at the end of the procedure.      The rest of the examined stomach was normal. Biopsies were taken with a       cold forceps for Helicobacter pylori testing.      The examined duodenum was normal. Impression:               - Normal esophagus.                           - Multiple bleeding gastric polyps. Nine resected                            and retrieved. Clip manufacturer: [employer]. Clips (MR safe) were placed. Injected.                           - Otherwise normal stomach. Biopsied.                           - Normal examined duodenum. Moderate Sedation:      Per Anesthesia Care Recommendation:           - Discharge patient to home (ambulatory).                           - Resume previous diet.                           -  Await pathology results.                           - Repeat upper endoscopy for surveillance based on                            pathology results.                           - Follow up iron stores.                           - Restart Xarelto in 4 days (06/12/2023).                           - Schedule esophagram.                           - Continue present medications. Procedure  Code(s):        --- Professional ---                           [phone], 59, Esophagogastroduodenoscopy, flexible,                            transoral; with control of bleeding, any method                           43251, Esophagogastroduodenoscopy, flexible,                            transoral; with removal of tumor(s), polyp(s), or                            other lesion(s) by snare technique                           43239, 59, Esophagogastroduodenoscopy, flexible,                            transoral; with biopsy, single or multiple Diagnosis Code(s):        --- Professional ---                           K31.7, Polyp of stomach and duodenum                           D50.9, Iron deficiency anemia, unspecified CPT copyright 2022 American Medical Association. All rights reserved. The codes documented in this report are preliminary and upon coder review may  be revised to meet current compliance requirements. Katrinka Blazing, MD Katrinka Blazing,  06/08/2023 1:31:16 PM This report has been signed electronically. Number of Addenda: 0

## 2023-06-08 NOTE — Progress Notes (Signed)
  Subjective:  Patient ID: [name] [name], female    DOB: 11-23-1940,  MRN: [id number]  [name] [name] presents to clinic today for painful, elongated thickened toenails x 10 which are symptomatic when wearing enclosed shoe gear. This interferes with his/her daily activities.  Patient states she will be having an endoscopy performed today.  Chief Complaint  Patient presents with   rfc    She is here for a nail trim, PCP is Dr. Sherryll Burger and seen a month ago,    New problem(s): None.   PCP is Kirstie Peri, MD.  Allergies  Allergen Reactions   Bupropion Hcl Other (See Comments)    Suicidal Thoughts.    Escitalopram Oxalate Other (See Comments)    Suicidal Thoughts.    Fluticasone-Salmeterol Other (See Comments)    Advair - Caused patient to go into Afib.    Lisinopril Cough   Serevent Other (See Comments)    Caused patient to go into Afib    Review of Systems: Negative except as noted in the HPI.  Objective: No changes noted in today's physical examination. There were no vitals filed for this visit. [name] [name] is a pleasant 83 y.o. female obese in NAD. AAO x 3.  Vascular Examination: Vascular status intact b/l with palpable pedal pulses. CFT immediate b/l. Pedal hair present. No edema. No pain with calf compression b/l. Skin temperature gradient WNL b/l. No varicosities noted. No cyanosis or clubbing noted.  Neurological Examination: Pt has subjective symptoms of neuropathy. Sensation grossly intact b/l with 10 gram monofilament. Vibratory sensation intact b/l.  Dermatological Examination: Pedal skin with normal turgor, texture and tone b/l. No open wounds nor interdigital macerations noted. Toenails 1-5 b/l thick, discolored, elongated with subungual debris and pain on dorsal palpation. No hyperkeratotic lesions noted b/l.   Musculoskeletal Examination: Muscle strength 5/5 to b/l LE. Hammertoes 2-5 b/l. Limited joint ROM to the 1st MPJ left foot. Pes planus  deformity noted bilateral LE.  Radiographs: None  Assessment/Plan: 1. Pain due to onychomycosis of toenails of both feet     Patient was evaluated and treated. All patient's and/or POA's questions/concerns addressed on today's visit. Mycotic toenails 1-5 debrided in length and girth without incident. Continue soft, supportive shoe gear daily. Report any pedal injuries to medical professional. Call office if there are any quesitons/concerns. -Patient/POA to call should there be question/concern in the interim.   Return in about 3 months (around 09/08/2023).  Freddie Breech, DPM      [facility] LOCATION: [address] [place] [address]                   Office [phone]   [facility] LOCATION: [address] [place], [place] [address] Office [phone]

## 2023-06-08 NOTE — Transfer of Care (Signed)
 Immediate Anesthesia Transfer of Care Note  Patient: ZAYNAH CHAWLA  Procedure(s) Performed: ESOPHAGOGASTRODUODENOSCOPY (EGD) WITH PROPOFOL  Patient Location: Short Stay  Anesthesia Type:General  Level of Consciousness: awake, alert , oriented, and patient cooperative  Airway & Oxygen Therapy: Patient Spontanous Breathing  Post-op Assessment: Report given to RN, Post -op Vital signs reviewed and stable, and Patient moving all extremities X 4  Post vital signs: Reviewed and stable  Last Vitals:  Vitals Value Taken Time  BP 144/62 06/08/23 1318  Temp 36.5 C 06/08/23 1318  Pulse 59 06/08/23 1318  Resp 19 06/08/23 1318  SpO2 97 % 06/08/23 1318    Last Pain:  Vitals:   06/08/23 1318  TempSrc: Oral  PainSc: 0-No pain         Complications: No notable events documented.

## 2023-06-08 NOTE — Interval H&P Note (Signed)
 History and Physical Interval Note:  06/08/2023 12:01 PM  [name] [name]  has presented today for surgery, with the diagnosis of ATYPICAL COUGH, GERD, ODYNOPHAGIA.  The various methods of treatment have been discussed with the patient and family. After consideration of risks, benefits and other options for treatment, the patient has consented to  Procedure(s) with comments: ESOPHAGOGASTRODUODENOSCOPY (EGD) WITH PROPOFOL (N/A) - 1:00PM;ASA 3 as a surgical intervention.  The patient's history has been reviewed, patient examined, no change in status, stable for surgery.  I have reviewed the patient's chart and labs.  Questions were answered to the patient's satisfaction.     [name]

## 2023-06-08 NOTE — Progress Notes (Signed)
 Reassessed patient after Duo-Neb treatment. Continues to have expiratory wheezing throughout. Pt reports that she is feeling better. Audible wheezing has improved.

## 2023-06-08 NOTE — Progress Notes (Signed)
 Pt noted to have audibile expiratory wheezes. Upon auscultation it was noted that patient had expiratory wheezes throughout in all lung bases. O2 sats remained stable on RA. Patient uses Duo-Neb at home. Spoke with anesthesia, orders to give Duo-Neb prior to d/c received.

## 2023-06-08 NOTE — Discharge Instructions (Signed)
 You are being discharged to home.  Resume your previous diet.  We are waiting for your pathology results.  Your physician has recommended a repeat upper endoscopy for surveillance based on pathology results.  Continue your present medications.  Follow up iron stores. Restart Xarelto in 4 days (06/12/2023). Schedule esophagram.

## 2023-06-09 ENCOUNTER — Other Ambulatory Visit: Payer: Self-pay | Admitting: Cardiology

## 2023-06-09 ENCOUNTER — Encounter (HOSPITAL_COMMUNITY): Payer: Self-pay | Admitting: Gastroenterology

## 2023-06-09 ENCOUNTER — Telehealth (INDEPENDENT_AMBULATORY_CARE_PROVIDER_SITE_OTHER): Payer: Self-pay | Admitting: *Deleted

## 2023-06-09 DIAGNOSIS — R1310 Dysphagia, unspecified: Secondary | ICD-10-CM

## 2023-06-09 LAB — SURGICAL PATHOLOGY

## 2023-06-09 NOTE — Telephone Encounter (Signed)
 Per EGD op note - schd esophagram

## 2023-06-10 ENCOUNTER — Encounter (INDEPENDENT_AMBULATORY_CARE_PROVIDER_SITE_OTHER): Payer: Self-pay | Admitting: *Deleted

## 2023-06-10 NOTE — Addendum Note (Signed)
 Addended by: Marlowe Shores on: 06/10/2023 09:56 AM   Modules accepted: Orders

## 2023-06-10 NOTE — Telephone Encounter (Signed)
3 mth EGD noted in recall 

## 2023-06-10 NOTE — Telephone Encounter (Signed)
 Hi, Dx for esophagram - dysphagia.  Repeat EGD in 3 month, Dx: history gastric polyps. Room 3 Thanks

## 2023-06-10 NOTE — Telephone Encounter (Signed)
 Esophagram scheduled and pt is aware.   Ann-can you put pt in for 3 month recall EGD? Thank you

## 2023-06-10 NOTE — Telephone Encounter (Signed)
 Please advise diagnosis for esophagram.  Does pt need to be scheduled for repeat EGD anytime soon? Please advise. Thank you.

## 2023-06-11 DIAGNOSIS — M6281 Muscle weakness (generalized): Secondary | ICD-10-CM | POA: Diagnosis not present

## 2023-06-11 DIAGNOSIS — M79673 Pain in unspecified foot: Secondary | ICD-10-CM | POA: Diagnosis not present

## 2023-06-11 DIAGNOSIS — M545 Low back pain, unspecified: Secondary | ICD-10-CM | POA: Diagnosis not present

## 2023-06-11 NOTE — Progress Notes (Signed)
 [facility] Summary Report / Loop Recorder

## 2023-06-11 NOTE — Addendum Note (Signed)
 Addended by: Geralyn Flash D on: 06/11/2023 01:40 PM   Modules accepted: Orders

## 2023-06-12 NOTE — Anesthesia Postprocedure Evaluation (Signed)
 Anesthesia Post Note  Patient: [name] [name]  Procedure(s) Performed: ESOPHAGOGASTRODUODENOSCOPY (EGD) WITH PROPOFOL  Patient location during evaluation: Phase II Anesthesia Type: General Level of consciousness: awake Pain management: pain level controlled Vital Signs Assessment: post-procedure vital signs reviewed and stable Respiratory status: spontaneous breathing and respiratory function stable Cardiovascular status: blood pressure returned to baseline and stable Postop Assessment: no headache and no apparent nausea or vomiting Anesthetic complications: no Comments: Late entry   No notable events documented.   Last Vitals:  Vitals:   06/08/23 1206 06/08/23 1318  BP: 127/67 (!) 144/62  Pulse: (!) 52 (!) 59  Resp: 17 19  Temp: 36.7 C 36.5 C  SpO2: 100% 97%    Last Pain:  Vitals:   06/09/23 1317  TempSrc:   PainSc: 0-No pain                 Windell Norfolk

## 2023-06-12 NOTE — Anesthesia Preprocedure Evaluation (Signed)
 Anesthesia Evaluation  Patient identified by MRN, date of birth, ID band Patient awake    Reviewed: Allergy & Precautions, H&P , NPO status , Patient's Chart, lab work & pertinent test results, reviewed documented beta blocker date and time   Airway Mallampati: II  TM Distance: >3 FB Neck ROM: full    Dental no notable dental hx.    Pulmonary shortness of breath, asthma , sleep apnea , COPD   Pulmonary exam normal breath sounds clear to auscultation       Cardiovascular Exercise Tolerance: Good hypertension, + CAD   Rhythm:regular Rate:Normal     Neuro/Psych  PSYCHIATRIC DISORDERS Anxiety Depression    negative neurological ROS     GI/Hepatic Neg liver ROS,GERD  ,,  Endo/Other  negative endocrine ROS    Renal/GU negative Renal ROS  negative genitourinary   Musculoskeletal   Abdominal   Peds  Hematology  (+) Blood dyscrasia, anemia   Anesthesia Other Findings   Reproductive/Obstetrics negative OB ROS                             Anesthesia Physical Anesthesia Plan  ASA: 3  Anesthesia Plan: General   Post-op Pain Management:    Induction:   PONV Risk Score and Plan: Propofol infusion  Airway Management Planned:   Additional Equipment:   Intra-op Plan:   Post-operative Plan:   Informed Consent: I have reviewed the patients History and Physical, chart, labs and discussed the procedure including the risks, benefits and alternatives for the proposed anesthesia with the patient or authorized representative who has indicated his/her understanding and acceptance.     Dental Advisory Given  Plan Discussed with: CRNA  Anesthesia Plan Comments:        Anesthesia Quick Evaluation

## 2023-06-14 ENCOUNTER — Ambulatory Visit: Payer: Medicare Other

## 2023-06-14 DIAGNOSIS — I4891 Unspecified atrial fibrillation: Secondary | ICD-10-CM

## 2023-06-15 ENCOUNTER — Telehealth: Payer: Self-pay | Admitting: Internal Medicine

## 2023-06-15 LAB — CUP PACEART REMOTE DEVICE CHECK
Date Time Interrogation Session: 20250317002740
Implantable Pulse Generator Implant Date: 20220826

## 2023-06-15 NOTE — Telephone Encounter (Signed)
 Patient faxed her portion of [employer] patient assistance (application, insurance, income, and pharmacy reports) and needs prescription of Anoro printed and signed to send with this to [employer]. Paperwork was placed in Dr. Roxy Cedar box and I have kept a copy to ensure this goes through. Please advise on signed prescription for Anoro

## 2023-06-16 ENCOUNTER — Ambulatory Visit (HOSPITAL_COMMUNITY)
Admission: RE | Admit: 2023-06-16 | Discharge: 2023-06-16 | Disposition: A | Discharge status: Home / Self Care | Source: Ambulatory Visit | Attending: Gastroenterology | Admitting: Gastroenterology

## 2023-06-16 DIAGNOSIS — R053 Chronic cough: Secondary | ICD-10-CM | POA: Diagnosis not present

## 2023-06-16 DIAGNOSIS — M6281 Muscle weakness (generalized): Secondary | ICD-10-CM | POA: Diagnosis not present

## 2023-06-16 DIAGNOSIS — M545 Low back pain, unspecified: Secondary | ICD-10-CM | POA: Diagnosis not present

## 2023-06-16 DIAGNOSIS — K224 Dyskinesia of esophagus: Secondary | ICD-10-CM | POA: Diagnosis not present

## 2023-06-16 DIAGNOSIS — M79673 Pain in unspecified foot: Secondary | ICD-10-CM | POA: Diagnosis not present

## 2023-06-16 DIAGNOSIS — R1310 Dysphagia, unspecified: Secondary | ICD-10-CM | POA: Diagnosis not present

## 2023-06-16 DIAGNOSIS — K317 Polyp of stomach and duodenum: Secondary | ICD-10-CM | POA: Diagnosis not present

## 2023-06-18 DIAGNOSIS — M6281 Muscle weakness (generalized): Secondary | ICD-10-CM | POA: Diagnosis not present

## 2023-06-18 DIAGNOSIS — M79673 Pain in unspecified foot: Secondary | ICD-10-CM | POA: Diagnosis not present

## 2023-06-18 DIAGNOSIS — M545 Low back pain, unspecified: Secondary | ICD-10-CM | POA: Diagnosis not present

## 2023-06-18 MED ORDER — UMECLIDINIUM-VILANTEROL 62.5-25 MCG/ACT IN AEPB: INHALATION_SPRAY | RESPIRATORY_TRACT | 3 refills | Status: AC

## 2023-06-18 NOTE — Telephone Encounter (Signed)
 Printed rx for [employer], Dr. Maple Hudson signed prescription and faxed all paperwork to [employer] at [phone].  Per OV note on 04/06/2023:  04/06/23- 83 year old female never smoker followed for OSA, allergic Rhinitis, Asthma/ Nucala, Hyper IgE, complicated by PAFibXarelto/ Pacemaker, HBP, GERD, obesity, Anemia,  -Neb Pulmicort/  Duoneb   Anoro, Flonase, Zyrtec, Singulair, Nucala,   Return in about 3 months (around 07/05/2023).

## 2023-06-21 ENCOUNTER — Encounter: Payer: Medicare Other | Attending: Internal Medicine | Admitting: *Deleted

## 2023-06-21 ENCOUNTER — Encounter (INDEPENDENT_AMBULATORY_CARE_PROVIDER_SITE_OTHER): Payer: Self-pay

## 2023-06-21 VITALS — BP 162/50 | HR 54 | Temp 97.5°F | Resp 18

## 2023-06-21 DIAGNOSIS — Z79620 Long term (current) use of immunosuppressive biologic: Secondary | ICD-10-CM | POA: Diagnosis not present

## 2023-06-21 DIAGNOSIS — J4520 Mild intermittent asthma, uncomplicated: Secondary | ICD-10-CM | POA: Insufficient documentation

## 2023-06-21 MED ORDER — MEPOLIZUMAB 100 MG ~~LOC~~ SOLR
100.0000 mg | Freq: Once | SUBCUTANEOUS | Status: AC
  Administered 2023-06-21: 100 mg via SUBCUTANEOUS

## 2023-06-21 NOTE — Progress Notes (Signed)
 Diagnosis: Asthma  Provider:   Jetty Duhamel, MD  Procedure: Injection  Nucala (Mepolizumab), Dose: 100 mg, Site: subcutaneous, Number of injections: 1  Injection Site(s): Left arm  Post Care: Observation period completed  Discharge: Condition: Good, Destination: Home . AVS Provided  Performed by:  Daleen Squibb, RN

## 2023-06-22 DIAGNOSIS — D0461 Carcinoma in situ of skin of right upper limb, including shoulder: Secondary | ICD-10-CM | POA: Diagnosis not present

## 2023-06-22 DIAGNOSIS — L2989 Other pruritus: Secondary | ICD-10-CM | POA: Diagnosis not present

## 2023-06-22 DIAGNOSIS — D225 Melanocytic nevi of trunk: Secondary | ICD-10-CM | POA: Diagnosis not present

## 2023-06-22 DIAGNOSIS — D485 Neoplasm of uncertain behavior of skin: Secondary | ICD-10-CM | POA: Diagnosis not present

## 2023-06-22 DIAGNOSIS — Z85828 Personal history of other malignant neoplasm of skin: Secondary | ICD-10-CM | POA: Diagnosis not present

## 2023-06-22 DIAGNOSIS — L820 Inflamed seborrheic keratosis: Secondary | ICD-10-CM | POA: Diagnosis not present

## 2023-06-22 DIAGNOSIS — L814 Other melanin hyperpigmentation: Secondary | ICD-10-CM | POA: Diagnosis not present

## 2023-06-22 DIAGNOSIS — L538 Other specified erythematous conditions: Secondary | ICD-10-CM | POA: Diagnosis not present

## 2023-06-22 DIAGNOSIS — L821 Other seborrheic keratosis: Secondary | ICD-10-CM | POA: Diagnosis not present

## 2023-06-22 DIAGNOSIS — Z08 Encounter for follow-up examination after completed treatment for malignant neoplasm: Secondary | ICD-10-CM | POA: Diagnosis not present

## 2023-06-22 DIAGNOSIS — Z789 Other specified health status: Secondary | ICD-10-CM | POA: Diagnosis not present

## 2023-06-23 ENCOUNTER — Encounter: Payer: Self-pay | Admitting: Cardiovascular Disease

## 2023-06-23 DIAGNOSIS — M545 Low back pain, unspecified: Secondary | ICD-10-CM | POA: Diagnosis not present

## 2023-06-23 DIAGNOSIS — M79673 Pain in unspecified foot: Secondary | ICD-10-CM | POA: Diagnosis not present

## 2023-06-23 DIAGNOSIS — M6281 Muscle weakness (generalized): Secondary | ICD-10-CM | POA: Diagnosis not present

## 2023-06-25 ENCOUNTER — Other Ambulatory Visit: Payer: Self-pay | Admitting: Cardiology

## 2023-06-25 DIAGNOSIS — M6281 Muscle weakness (generalized): Secondary | ICD-10-CM | POA: Diagnosis not present

## 2023-06-25 DIAGNOSIS — M545 Low back pain, unspecified: Secondary | ICD-10-CM | POA: Diagnosis not present

## 2023-06-25 DIAGNOSIS — M79673 Pain in unspecified foot: Secondary | ICD-10-CM | POA: Diagnosis not present

## 2023-06-27 DIAGNOSIS — I10 Essential (primary) hypertension: Secondary | ICD-10-CM | POA: Diagnosis not present

## 2023-06-28 DIAGNOSIS — M6281 Muscle weakness (generalized): Secondary | ICD-10-CM | POA: Diagnosis not present

## 2023-06-28 DIAGNOSIS — M79673 Pain in unspecified foot: Secondary | ICD-10-CM | POA: Diagnosis not present

## 2023-06-28 DIAGNOSIS — M545 Low back pain, unspecified: Secondary | ICD-10-CM | POA: Diagnosis not present

## 2023-06-30 ENCOUNTER — Encounter: Payer: Self-pay | Admitting: *Deleted

## 2023-07-01 ENCOUNTER — Other Ambulatory Visit: Payer: Self-pay | Admitting: Cardiology

## 2023-07-02 ENCOUNTER — Encounter: Payer: Medicare Other | Attending: Internal Medicine | Admitting: Cardiovascular Disease

## 2023-07-02 ENCOUNTER — Encounter: Payer: Self-pay | Admitting: Cardiovascular Disease

## 2023-07-02 VITALS — BP 122/68 | HR 55 | Ht 61.0 in | Wt 198.8 lb

## 2023-07-02 DIAGNOSIS — M545 Low back pain, unspecified: Secondary | ICD-10-CM | POA: Diagnosis not present

## 2023-07-02 DIAGNOSIS — J4520 Mild intermittent asthma, uncomplicated: Secondary | ICD-10-CM | POA: Diagnosis not present

## 2023-07-02 DIAGNOSIS — I4819 Other persistent atrial fibrillation: Secondary | ICD-10-CM | POA: Diagnosis not present

## 2023-07-02 DIAGNOSIS — M79673 Pain in unspecified foot: Secondary | ICD-10-CM | POA: Diagnosis not present

## 2023-07-02 DIAGNOSIS — M6281 Muscle weakness (generalized): Secondary | ICD-10-CM | POA: Diagnosis not present

## 2023-07-02 NOTE — Progress Notes (Signed)
 Cardiology Office Note:    Date:  07/02/2023   ID:  [name] [name], DOB 06-02-1940, MRN [id number]  PCP:  Kirstie Peri, MD   [facility] Providers Cardiologist:  Nona Dell, MD Electrophysiologist:  Maurice Small, MD     Referring MD: Kirstie Peri, MD   Chief complaint: fatigue, shortness of breath  History of Present Illness:    [name] [name] is a 83 y.o. female with a hx of paroxysmal AF, atrial flutter, COPD, OSA who presents for EP follow-up.  She is a former patient of Dr. Johney Frame and underwent an AF ablation in June, 2022. There was some reconnection of the right inferior pulmonary vein and the left superior veins. These were re-ablated. Additionally, a posterior wall box lesion set was created.  She has been managed on Tikosyn and xarelto since. She saw Dr. Diona Browner in follow-up a few days ago and noted that she has not been feeling well recently. Her ILR has shown an increase in AF burden, now 65% (9/23 month 50%, 8/23 was 25%). ECG during that clinic visit showed an atypical flutter with a cycle length of about 300 msec.  She underwent a 3rd AF ablation in December, 2023.  At that time, there was a small amount of breakthrough at the inferior line of the posterior left atrial box.  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  I reinforced the prior posterior box lesion set and created a mitral line.  She reports that she has begun pulmonary rehab and feels that her shortness of breath has improved with this.  Reviewed her loop interrogation.  She has been having on average 1 episode of A-fib lasting between 20 and 40 minutes about every day.  No chest pain, syncope, pre-syncope.     EKGs/Labs/Other Studies Reviewed:     EKG:  EKG Interpretation Date/Time:  Friday July 02 2023 13:02:08 EDT Ventricular Rate:  55 PR Interval:    QRS Duration:  110 QT Interval:  460 QTC Calculation: 440 R  Axis:   -53  Text Interpretation: Normal sinus rhythm Left axis deviation Incomplete left bundle branch block Left ventricular hypertrophy with repolarization abnormality ( R in aVL , Cornell product ) When compared with ECG of 19-Apr-2023 08:21, No significant change was found Confirmed by York Pellant [phone]) on 07/02/2023 1:06:03 PM    Recent Labs: 01/07/2023: BUN 17; Creatinine, Ser 1.10; Magnesium 2.1; Potassium 4.1; Sodium 137 06/08/2023: Hemoglobin 11.2; Platelets 211    Risk Assessment/Calculations:    CHA2DS2-VASc Score = 5   This indicates a 7.2% annual risk of stroke. The patient's score is based upon: CHF History: 0 HTN History: 1 Diabetes History: 0 Stroke History: 0 Vascular Disease History: 1 Age Score: 2 Gender Score: 1            Physical Exam:    VS:  BP 122/68   Pulse (!) 55   Ht 5\' 1"  (1.549 m)   Wt 198 lb 12.8 oz (90.2 kg)   SpO2 95%   BMI 37.56 kg/m     Wt Readings from Last 3 Encounters:  07/02/23 198 lb 12.8 oz (90.2 kg)  06/08/23 191 lb 8 oz (86.9 kg)  06/08/23 191 lb 8 oz (86.9 kg)     GEN: Well nourished, well developed in no acute distress CARDIAC: irregular, no murmurs, rubs, gallops RESPIRATORY:  Normal work of breathing MUSCULOSKELETAL: trace edema    ASSESSMENT & PLAN:  Persistent atrial fibrillation: s/p repeat (3rd) ablation 03/18/2022. There were not many targets for ablation at that time. I do not think she would benefit from another ablation.       - still having AF with a burden of about 21%       - continue Tikosyn. Labs 05/2023 reviewed, EKG reviewed  Atypical atrial flutter: documented on 10/24 ECG. Likely involving the posterior LA.   Secondary hypercoagulable state: continue anticoagulation for CHADS2Vasc of 3       Medication Adjustments/Labs and Tests Ordered: Current medicines are reviewed at length with the patient today.  Concerns regarding medicines are outlined above.  Orders Placed This Encounter   Procedures   EKG 12-Lead   No orders of the defined types were placed in this encounter.    Signed, Maurice Small, MD  07/02/2023 1:06 PM    [facility]

## 2023-07-02 NOTE — Patient Instructions (Addendum)
 Medication Instructions:  Continue all current medications.   Labwork: none  Testing/Procedures: none  Follow-Up: 6 months   Any Other Special Instructions Will Be Listed Below (If Applicable).   If you need a refill on your cardiac medications before your next appointment, please call your pharmacy.

## 2023-07-02 NOTE — Telephone Encounter (Signed)
 Left voicemail for patient to follow up to ensure she has gotten what she needs.

## 2023-07-04 NOTE — Progress Notes (Unsigned)
 HPI female never smoker followed for OSA, allergic rhinitis, asthma, Hyper IgE,  complicated by A. Fib/Coumadin, HBP, GERD, obesity NPSG prior to EMR in 2012 Office Spirometry 09/02/16-WNL. FVC 2.06/76%, FEV1 1.57/77%, ratio 0.76, FEF 25-75% 1.26/80% Allergy labs 09/02/16- eosinophils 200, total IgE 4230 causing elevation of all specific allergen antibody levels including Aspergillus. Nucala  + 2018 PFT-10/13/16-minimal obstruction without response to dilator, minimal reduction of diffusion. FVC 2.02/77%, FEV1 1.62/83%, ratio 0.80, TLC 92%, DLCO 77% CT soft tissue neck 09/22/16-1. 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  Subsegmental atelectasis and nonspecific linear patchy densities in the lungs as described. They have a benign appearance.   Small hyperdense masses in the mediastinum are stable compared with 2015 supporting benign etiology such as ectopic thyroid tissue.  Three-vessel prominent coronary artery calcification.  Bibasilar bronchiolectasis. Labs 09/20/2017-WBC 14,900, hemoglobin 11.2, eosinophils absent, lymphocytes low( ?steroids or viral?), BNP 220, CMET wnl IgE 04/12/2018- 2,574, 10/16/16- 4,302, 11/09/13- 3,513 Office Spirometry 04/12/2018-mild restriction of exhaled volume.  Possible mild obstructive airways.  FVC 1.9/73%, FEV1 1.4/75%, ratio 1.76, FEF 25-75% 1.2/80%  -------------------------------------------------------------------------------------------  12/31/22- 83 year old female never smoker followed for OSA, allergic Rhinitis, Asthma/ Nucala, Hyper IgE, complicated by PAFibXarelto/ Pacemaker, HBP, GERD, obesity, Anemia,  -Neb Pulmicort/  Duoneb   Anoro, Flonase, Zyrtec, Singulair, Nucala,  CPAP 5-15/Adapt      AirSense 11 AutoSet Download- compliance 100%, AHI 1.6/hr Body weight today- 192 lbs Download reviewed.  She went back to using an old machine that she prefers but it is working fine for now.  She will  discuss with her homecare company. Physical therapy has been a big help. Has had episodes of nasal stuffiness and head congestion she refers to as "allergy attacks" recently without specific trigger identified.  Had COVID infection marked by sinusitis and also, she says strep throat. Using her incentive spirometry.  During COVID she used her nebulizer twice a day but not needing it now.    04/06/23- 83 year old female never smoker followed for OSA, allergic Rhinitis, Asthma/ Nucala, Hyper IgE, complicated by PAFibXarelto/ Pacemaker, HBP, GERD, obesity, Anemia,  -Neb Pulmicort/  Duoneb   Anoro, Flonase, Zyrtec, Singulair, Nucala,  CPAP 5-15/Adapt      AirSense 11 AutoSet Download- compliance 63%, AHI 1.2/hr Body weight today- 194 lbs Had Speech Therapy eval with [facility]. No penetration. Rec sips, upright after meals, ENT if persistent problem. Discussed the use of AI scribe software for clinical note transcription with the patient, who gave verbal consent to proceed. History of Present Illness   The patient, with a history of severe asthmatic bronchitis, presents after a recent exacerbation. She was diagnosed with asthmatic bronchitis and treated with budesonide and amoxicillin, limited by her cardiac medication, Tikosyn. She reports a persistent cough and hoarseness, but notes an improvement in her symptoms over the last few days. She is currently using DuoNeb four times a day and budesonide twice a day. She also had a COVID infection in September, which she reports did not significantly affect her lungs. She is actively managing her condition with daily use of an inspiratory spirometer, an asthma meter, and an Acapella device. She also reports a potential issue with her CPAP machine (noise- possibly worn bearing), which she uses for sleep apnea. She has a history of reflux and has been experiencing coughing spasms after eating, particularly dry foods. She has been recommended to undergo an endoscopy by a  gastroenterologist. Recent Speech Therapy with [facility] did not show penetration.  Assessment and Plan:    Severe Asthmatic Bronchitis Recent exacerbation with coughing and hoarseness. Improved with budesonide and amoxicillin. Limited antibiotic options due to Tikosyn use. Currently using DuoNeb four times a day and budesonide twice a day. Lungs sound clear on examination. -Continue current treatment regimen. -Consider further evaluation if symptoms worsen.  Upper Airway Cough Possible reflux or upper airway cough syndrome contributing to coughing spasms, particularly after eating dry foods. -Consider further evaluation by gastroenterologist as planned and possible endoscopy. -Consider ENT consultation for vocal cord evaluation.  Obstructive Sleep Apnea Using auto CPAP with settings 5-15. Recent concern about machine noise. -Continue current CPAP settings. -Contact home care company if machine issues persist.  General Health Maintenance -Administer Prevnar 20 Pneumonia vaccine today. -Defer COVID booster until 3 months post-infection. -Continue current respiratory exercises and monitoring. -Schedule follow-up in 3 months- Pt request.       07/05/23- 83 year old female never smoker followed for OSA, allergic Rhinitis, Asthma/ Nucala, Hyper IgE, complicated by PAFibXarelto/ Pacemaker, HBP, GERD, obesity, Anemia,  -Neb Pulmicort/  Duoneb   Anoro, Flonase, Zyrtec, Singulair, Nucala,  CPAP 5-15/Adapt      AirSense 11 AutoSet Download- compliance 100%, AHI 2.2/hr Body weight today- 199 lbs Discussed the use of AI scribe software for clinical note transcription with the patient, who gave verbal consent to proceed. History of Present Illness   The patient, with a history of sleep apnea, presents with ongoing issues with her CPAP mask. She reports discomfort and difficulty achieving a proper fit, leading to leaks and skin irritation on the nose. She has previously experienced TMJ and soreness on  bridge of nose  from tightening the mask too much and wishes to avoid a recurrence. She has an upcoming appointment with the equipment provider to address these issues.  In addition, the patient reports feeling 'tight' in the chest and shortness of breath, particularly after exertion such as 'getting out and about.' She is currently undergoing physical therapy for issues with her feet, which have been causing difficulty walking and are visibly swollen. She also reports frequent nocturia, approximately every hour at night, which she attributes to her heart medications.  The patient also mentions ongoing issues with coughing and swallowing, despite recent endoscopy and swallowing tests which did not identify a cause. She has had polyps removed from her stomach, some of which were bleeding, and is due for a follow-up procedure in three months to stretch her esophagus. She expresses interest in seeing an ear, nose, and throat specialist to further investigate these symptoms.    Describes LPR symptoms, L ear stopped up. Agrees to ENT referral.  ROS-see HPI   + = positive Constitutional:   No-   weight loss, night sweats, fevers, chills, fatigue, lassitude. HEENT:   No-  headaches, difficulty swallowing, tooth/dental problems, sore throat,       No-  sneezing, itching,  ear ache,  +nasal congestion, post nasal drip,  CV:  No-   chest pain, orthopnea, PND, +swelling in lower extremities, anasarca, dizziness, palpitations Resp: +  shortness of breath with exertion or at rest.                +productive cough,  + non-productive cough,  No- coughing up of blood.              No-change in color of mucus. + wheezing.   Skin: No-   rash or lesions. GI:  No-   heartburn, indigestion, abdominal pain, nausea, vomiting, GU:  MS:  No-   joint pain or swelling. . Neuro-     nothing unusual Psych:  No- change in mood or affect. No depression or anxiety.  No memory loss.  OBJ- Physical Exam General- Alert,  Oriented, Affect-appropriate, Distress- none acute, + obese Skin- rash-none, lesions- none, excoriation- none Lymphadenopathy- none Head- atraumatic            Eyes- Gross vision intact, PERRLA, conjunctivae and secretions clear            Ears- Hearing, canals-normal            Nose- No turbinate edema, no-Septal dev, mucus, polyps, erosion, perforation             Throat- Mallampati III-IV , mucosa- not red,  drainage- none, tonsils- atrophic Neck- flexible , trachea midline, no stridor , thyroid nl, carotid no bruit Chest - symmetrical excursion , unlabored           Heart/CV- RR today/ hx AFib(no pacemaker) , no murmur , no gallop, no rub, nl  s1 s2                  - JVD- none , edema- none, stasis changes- none, varices- none           Lung- + trace crackle L base, Wheeze-none , cough-none,                         dullness-none, rub- none           Chest wall-  Abd- Br/ Gen/ Rectal- Not done, not indicated Extrem- cyanosis- none, clubbing, none, atrophy- none, strength- nl, + cane Neuro- grossly intact to observation  Assessment and Plan:    Obstructive Sleep Apnea CPAP effectively controls apneas but mask fit issues cause discomfort and air leakage. - Order mask refitting. - Ensure face-to-face appointment for mask refitting. - Discuss different mask styles with equipment provider.  Chronic Cough Chronic cough possibly related to esophageal issues, requires ENT evaluation considering GI findings. - Refer to ENT specialist for evaluation of chronic cough and related symptoms. - Instruct her to inform ENT about GI findings and previous tests.  Gastrointestinal Polyps Endoscopy revealed stomach polyps with bleeding; follow-up endoscopy planned for further evaluation and esophageal stretching. - GI to Schedule follow-up endoscopy in three months for esophageal stretching and polyp evaluation.  Foot Pain and Swelling Foot pain and swelling affect mobility; physical therapy and  chiropractic care ongoing. - Continue physical therapy for foot pain and swelling. - Attend chiropractic appointment for further evaluation. - Consider using higher elastic socks for swelling.  Frequent Nocturia Frequent nocturia likely related to heart medications for fluid retention management. - Discuss nocturia with primary care physician/ Cardiology.Waymon Budge, MD

## 2023-07-05 ENCOUNTER — Encounter: Payer: Self-pay | Admitting: Internal Medicine

## 2023-07-05 ENCOUNTER — Ambulatory Visit (INDEPENDENT_AMBULATORY_CARE_PROVIDER_SITE_OTHER): Payer: Medicare Other | Admitting: Internal Medicine

## 2023-07-05 VITALS — BP 146/63 | HR 55 | Temp 98.2°F | Resp 18 | Ht 61.5 in | Wt 199.6 lb

## 2023-07-05 DIAGNOSIS — G4733 Obstructive sleep apnea (adult) (pediatric): Secondary | ICD-10-CM

## 2023-07-05 DIAGNOSIS — M9905 Segmental and somatic dysfunction of pelvic region: Secondary | ICD-10-CM | POA: Diagnosis not present

## 2023-07-05 DIAGNOSIS — M9903 Segmental and somatic dysfunction of lumbar region: Secondary | ICD-10-CM | POA: Diagnosis not present

## 2023-07-05 DIAGNOSIS — M62830 Muscle spasm of back: Secondary | ICD-10-CM | POA: Diagnosis not present

## 2023-07-05 DIAGNOSIS — M25571 Pain in right ankle and joints of right foot: Secondary | ICD-10-CM | POA: Diagnosis not present

## 2023-07-05 DIAGNOSIS — M9902 Segmental and somatic dysfunction of thoracic region: Secondary | ICD-10-CM | POA: Diagnosis not present

## 2023-07-05 DIAGNOSIS — R059 Cough, unspecified: Secondary | ICD-10-CM

## 2023-07-05 DIAGNOSIS — M25572 Pain in left ankle and joints of left foot: Secondary | ICD-10-CM | POA: Diagnosis not present

## 2023-07-05 DIAGNOSIS — M546 Pain in thoracic spine: Secondary | ICD-10-CM | POA: Diagnosis not present

## 2023-07-05 NOTE — Patient Instructions (Addendum)
 Order- DME [employer]- please have face to face meeting with patient to refit mask of choice for comfort and seal.  Order- Refer to [facility] ENT [facility])   LPR cough,

## 2023-07-07 DIAGNOSIS — M9902 Segmental and somatic dysfunction of thoracic region: Secondary | ICD-10-CM | POA: Diagnosis not present

## 2023-07-07 DIAGNOSIS — M9903 Segmental and somatic dysfunction of lumbar region: Secondary | ICD-10-CM | POA: Diagnosis not present

## 2023-07-07 DIAGNOSIS — M62830 Muscle spasm of back: Secondary | ICD-10-CM | POA: Diagnosis not present

## 2023-07-07 DIAGNOSIS — M25571 Pain in right ankle and joints of right foot: Secondary | ICD-10-CM | POA: Diagnosis not present

## 2023-07-07 DIAGNOSIS — M9905 Segmental and somatic dysfunction of pelvic region: Secondary | ICD-10-CM | POA: Diagnosis not present

## 2023-07-07 DIAGNOSIS — M25572 Pain in left ankle and joints of left foot: Secondary | ICD-10-CM | POA: Diagnosis not present

## 2023-07-07 DIAGNOSIS — M546 Pain in thoracic spine: Secondary | ICD-10-CM | POA: Diagnosis not present

## 2023-07-08 ENCOUNTER — Ambulatory Visit (INDEPENDENT_AMBULATORY_CARE_PROVIDER_SITE_OTHER): Admitting: Podiatry

## 2023-07-08 ENCOUNTER — Ambulatory Visit (INDEPENDENT_AMBULATORY_CARE_PROVIDER_SITE_OTHER)

## 2023-07-08 DIAGNOSIS — M7752 Other enthesopathy of left foot: Secondary | ICD-10-CM

## 2023-07-08 DIAGNOSIS — M778 Other enthesopathies, not elsewhere classified: Secondary | ICD-10-CM | POA: Diagnosis not present

## 2023-07-08 NOTE — Patient Instructions (Signed)
For instructions on how to put on your Tri-Lock Ankle Brace, please visit [email or web address] 

## 2023-07-08 NOTE — Progress Notes (Signed)
 Subjective: Chief Complaint  Patient presents with   Foot Pain    RM#13 Left foot and ankle pain x 1 week after exercising on bike at physical therapy.   83 year old female presents the office today with concerns of new left foot and ankle pain x 1 week.  She states that she was doing much better to her feet.  She went to physical therapy on Friday and they increased the resistance on the bike.  As she was palpated and started to hurt.  She felt the muscles in her instep were tight.  She then went to her chiropractor.  She had some swelling most of the medial aspect ankle but she does report is coming down some.  No other injuries.   Objective: AAO x3, NAD DP/PT pulses palpable bilaterally, CRT less than 3 seconds With attention directed to the left foot, ankle she does get some tenderness palpation of the course the posterior tibial tendon this is where she does some localized edema more along the distal portion inferior to the medial malleolus.  Unable to appreciate any area pinpoint tenderness.  Does appear that the tendons are clinically intact.  There is no erythema or warmth. No pain with calf compression, swelling, warmth, erythema  Assessment: Posterior tibial tendinitis  Plan: -All treatment options discussed with the patient including all alternatives, risks, complications.  -X-rays were obtained reviewed.  Multiple views were obtained of the foot and ankle.  Not able to appreciate any evidence of acute fracture.  Chronic ossicle noted along the inferior portion of the medial malleolus.  Previous MTPJ arthrodesis present. -Tri-Lock ankle brace dispensed help support and stabilize the soft tissue.  Unfortunately need to hold off on anti-inflammatories.  Discussed icing, topical medications that she can do on a regular basis.  I did it is okay to do physical therapy but decrease the resistance is ongoing of anything is hurting when she is doing this.  If symptoms persist MRI. -Patient  encouraged to call the office with any questions, concerns, change in symptoms.   Charity Conch DPM

## 2023-07-12 ENCOUNTER — Encounter: Payer: Self-pay | Admitting: Podiatry

## 2023-07-14 DIAGNOSIS — M9902 Segmental and somatic dysfunction of thoracic region: Secondary | ICD-10-CM | POA: Diagnosis not present

## 2023-07-14 DIAGNOSIS — M9903 Segmental and somatic dysfunction of lumbar region: Secondary | ICD-10-CM | POA: Diagnosis not present

## 2023-07-14 DIAGNOSIS — M9905 Segmental and somatic dysfunction of pelvic region: Secondary | ICD-10-CM | POA: Diagnosis not present

## 2023-07-14 DIAGNOSIS — M62830 Muscle spasm of back: Secondary | ICD-10-CM | POA: Diagnosis not present

## 2023-07-14 DIAGNOSIS — M25571 Pain in right ankle and joints of right foot: Secondary | ICD-10-CM | POA: Diagnosis not present

## 2023-07-14 DIAGNOSIS — M25572 Pain in left ankle and joints of left foot: Secondary | ICD-10-CM | POA: Diagnosis not present

## 2023-07-14 DIAGNOSIS — M546 Pain in thoracic spine: Secondary | ICD-10-CM | POA: Diagnosis not present

## 2023-07-15 ENCOUNTER — Encounter: Payer: Self-pay | Admitting: Internal Medicine

## 2023-07-15 DIAGNOSIS — M25571 Pain in right ankle and joints of right foot: Secondary | ICD-10-CM | POA: Diagnosis not present

## 2023-07-15 DIAGNOSIS — M9902 Segmental and somatic dysfunction of thoracic region: Secondary | ICD-10-CM | POA: Diagnosis not present

## 2023-07-15 DIAGNOSIS — M546 Pain in thoracic spine: Secondary | ICD-10-CM | POA: Diagnosis not present

## 2023-07-15 DIAGNOSIS — M62830 Muscle spasm of back: Secondary | ICD-10-CM | POA: Diagnosis not present

## 2023-07-15 DIAGNOSIS — M9903 Segmental and somatic dysfunction of lumbar region: Secondary | ICD-10-CM | POA: Diagnosis not present

## 2023-07-15 DIAGNOSIS — M9905 Segmental and somatic dysfunction of pelvic region: Secondary | ICD-10-CM | POA: Diagnosis not present

## 2023-07-15 DIAGNOSIS — M25572 Pain in left ankle and joints of left foot: Secondary | ICD-10-CM | POA: Diagnosis not present

## 2023-07-19 ENCOUNTER — Ambulatory Visit: Payer: Medicare Other

## 2023-07-19 ENCOUNTER — Encounter: Payer: Medicare Other | Admitting: *Deleted

## 2023-07-19 VITALS — BP 151/56 | HR 62 | Temp 97.6°F | Resp 18

## 2023-07-19 DIAGNOSIS — M9905 Segmental and somatic dysfunction of pelvic region: Secondary | ICD-10-CM | POA: Diagnosis not present

## 2023-07-19 DIAGNOSIS — M62830 Muscle spasm of back: Secondary | ICD-10-CM | POA: Diagnosis not present

## 2023-07-19 DIAGNOSIS — M546 Pain in thoracic spine: Secondary | ICD-10-CM | POA: Diagnosis not present

## 2023-07-19 DIAGNOSIS — J4520 Mild intermittent asthma, uncomplicated: Secondary | ICD-10-CM | POA: Diagnosis not present

## 2023-07-19 DIAGNOSIS — I4819 Other persistent atrial fibrillation: Secondary | ICD-10-CM

## 2023-07-19 DIAGNOSIS — M9903 Segmental and somatic dysfunction of lumbar region: Secondary | ICD-10-CM | POA: Diagnosis not present

## 2023-07-19 DIAGNOSIS — M25572 Pain in left ankle and joints of left foot: Secondary | ICD-10-CM | POA: Diagnosis not present

## 2023-07-19 DIAGNOSIS — M25571 Pain in right ankle and joints of right foot: Secondary | ICD-10-CM | POA: Diagnosis not present

## 2023-07-19 DIAGNOSIS — M9902 Segmental and somatic dysfunction of thoracic region: Secondary | ICD-10-CM | POA: Diagnosis not present

## 2023-07-19 MED ORDER — MEPOLIZUMAB 100 MG ~~LOC~~ SOLR
100.0000 mg | Freq: Once | SUBCUTANEOUS | Status: AC
  Administered 2023-07-19: 100 mg via SUBCUTANEOUS

## 2023-07-19 NOTE — Progress Notes (Signed)
 Diagnosis: Asthma  Provider:   Rosa College, MD  Procedure: Injection  Nucala , Dose: 100 mg, Site: subcutaneous, Number of injections: 1  Injection Site(s): Right arm  Post Care: Observation period completed  Discharge: Condition: Good, Destination: Home . AVS Provided  Performed by:  Verneda Golder, RN

## 2023-07-20 LAB — CUP PACEART REMOTE DEVICE CHECK
Date Time Interrogation Session: 20250421002155
Implantable Pulse Generator Implant Date: 20220826

## 2023-07-21 DIAGNOSIS — M79673 Pain in unspecified foot: Secondary | ICD-10-CM | POA: Diagnosis not present

## 2023-07-21 DIAGNOSIS — M545 Low back pain, unspecified: Secondary | ICD-10-CM | POA: Diagnosis not present

## 2023-07-21 DIAGNOSIS — M6281 Muscle weakness (generalized): Secondary | ICD-10-CM | POA: Diagnosis not present

## 2023-07-23 DIAGNOSIS — M6281 Muscle weakness (generalized): Secondary | ICD-10-CM | POA: Diagnosis not present

## 2023-07-23 DIAGNOSIS — M545 Low back pain, unspecified: Secondary | ICD-10-CM | POA: Diagnosis not present

## 2023-07-23 DIAGNOSIS — M79673 Pain in unspecified foot: Secondary | ICD-10-CM | POA: Diagnosis not present

## 2023-07-26 DIAGNOSIS — M6281 Muscle weakness (generalized): Secondary | ICD-10-CM | POA: Diagnosis not present

## 2023-07-26 DIAGNOSIS — M79673 Pain in unspecified foot: Secondary | ICD-10-CM | POA: Diagnosis not present

## 2023-07-26 DIAGNOSIS — M545 Low back pain, unspecified: Secondary | ICD-10-CM | POA: Diagnosis not present

## 2023-07-27 DIAGNOSIS — L570 Actinic keratosis: Secondary | ICD-10-CM | POA: Diagnosis not present

## 2023-07-27 DIAGNOSIS — D0461 Carcinoma in situ of skin of right upper limb, including shoulder: Secondary | ICD-10-CM | POA: Diagnosis not present

## 2023-07-27 NOTE — Progress Notes (Signed)
 [facility] Summary Report / Loop Recorder

## 2023-07-27 NOTE — Addendum Note (Signed)
 Addended by: Edra Govern D on: 07/27/2023 02:29 PM   Modules accepted: Orders

## 2023-07-28 DIAGNOSIS — I10 Essential (primary) hypertension: Secondary | ICD-10-CM | POA: Diagnosis not present

## 2023-07-29 ENCOUNTER — Encounter: Payer: Self-pay | Admitting: Cardiovascular Disease

## 2023-07-30 DIAGNOSIS — M545 Low back pain, unspecified: Secondary | ICD-10-CM | POA: Diagnosis not present

## 2023-07-30 DIAGNOSIS — M6281 Muscle weakness (generalized): Secondary | ICD-10-CM | POA: Diagnosis not present

## 2023-07-30 DIAGNOSIS — M79673 Pain in unspecified foot: Secondary | ICD-10-CM | POA: Diagnosis not present

## 2023-08-02 DIAGNOSIS — M79673 Pain in unspecified foot: Secondary | ICD-10-CM | POA: Diagnosis not present

## 2023-08-02 DIAGNOSIS — M6281 Muscle weakness (generalized): Secondary | ICD-10-CM | POA: Diagnosis not present

## 2023-08-02 DIAGNOSIS — M545 Low back pain, unspecified: Secondary | ICD-10-CM | POA: Diagnosis not present

## 2023-08-04 DIAGNOSIS — M545 Low back pain, unspecified: Secondary | ICD-10-CM | POA: Diagnosis not present

## 2023-08-04 DIAGNOSIS — M6281 Muscle weakness (generalized): Secondary | ICD-10-CM | POA: Diagnosis not present

## 2023-08-04 DIAGNOSIS — M79673 Pain in unspecified foot: Secondary | ICD-10-CM | POA: Diagnosis not present

## 2023-08-05 ENCOUNTER — Ambulatory Visit

## 2023-08-05 ENCOUNTER — Encounter (INDEPENDENT_AMBULATORY_CARE_PROVIDER_SITE_OTHER): Payer: Self-pay | Admitting: *Deleted

## 2023-08-05 ENCOUNTER — Ambulatory Visit: Admitting: Podiatry

## 2023-08-05 NOTE — Progress Notes (Signed)
 Patient was here for modifications to 2nd pair of orthotics  She has been very happy with mods to 1st pair  Patient is happy with fit and function if any problems arise she will call for an appt  Britton Cane Cped, CFo. CFm

## 2023-08-06 DIAGNOSIS — R5383 Other fatigue: Secondary | ICD-10-CM | POA: Diagnosis not present

## 2023-08-06 DIAGNOSIS — Z0000 Encounter for general adult medical examination without abnormal findings: Secondary | ICD-10-CM | POA: Diagnosis not present

## 2023-08-06 DIAGNOSIS — Z7189 Other specified counseling: Secondary | ICD-10-CM | POA: Diagnosis not present

## 2023-08-06 DIAGNOSIS — Z1331 Encounter for screening for depression: Secondary | ICD-10-CM | POA: Diagnosis not present

## 2023-08-06 DIAGNOSIS — Z299 Encounter for prophylactic measures, unspecified: Secondary | ICD-10-CM | POA: Diagnosis not present

## 2023-08-06 DIAGNOSIS — E1169 Type 2 diabetes mellitus with other specified complication: Secondary | ICD-10-CM | POA: Diagnosis not present

## 2023-08-06 DIAGNOSIS — Z79899 Other long term (current) drug therapy: Secondary | ICD-10-CM | POA: Diagnosis not present

## 2023-08-06 DIAGNOSIS — E7800 Pure hypercholesterolemia, unspecified: Secondary | ICD-10-CM | POA: Diagnosis not present

## 2023-08-06 DIAGNOSIS — R52 Pain, unspecified: Secondary | ICD-10-CM | POA: Diagnosis not present

## 2023-08-06 DIAGNOSIS — Z1339 Encounter for screening examination for other mental health and behavioral disorders: Secondary | ICD-10-CM | POA: Diagnosis not present

## 2023-08-06 DIAGNOSIS — I10 Essential (primary) hypertension: Secondary | ICD-10-CM | POA: Diagnosis not present

## 2023-08-09 DIAGNOSIS — Z79899 Other long term (current) drug therapy: Secondary | ICD-10-CM | POA: Diagnosis not present

## 2023-08-09 DIAGNOSIS — E559 Vitamin D deficiency, unspecified: Secondary | ICD-10-CM | POA: Diagnosis not present

## 2023-08-09 DIAGNOSIS — R5383 Other fatigue: Secondary | ICD-10-CM | POA: Diagnosis not present

## 2023-08-09 DIAGNOSIS — E7800 Pure hypercholesterolemia, unspecified: Secondary | ICD-10-CM | POA: Diagnosis not present

## 2023-08-10 ENCOUNTER — Ambulatory Visit (INDEPENDENT_AMBULATORY_CARE_PROVIDER_SITE_OTHER): Payer: Medicare Other | Admitting: Podiatry

## 2023-08-10 ENCOUNTER — Ambulatory Visit (INDEPENDENT_AMBULATORY_CARE_PROVIDER_SITE_OTHER)

## 2023-08-10 ENCOUNTER — Encounter: Payer: Self-pay | Admitting: Podiatry

## 2023-08-10 VITALS — Ht 61.5 in | Wt 199.6 lb

## 2023-08-10 DIAGNOSIS — B351 Tinea unguium: Secondary | ICD-10-CM

## 2023-08-10 DIAGNOSIS — M79674 Pain in right toe(s): Secondary | ICD-10-CM | POA: Diagnosis not present

## 2023-08-10 DIAGNOSIS — L97511 Non-pressure chronic ulcer of other part of right foot limited to breakdown of skin: Secondary | ICD-10-CM | POA: Diagnosis not present

## 2023-08-10 DIAGNOSIS — M79675 Pain in left toe(s): Secondary | ICD-10-CM | POA: Diagnosis not present

## 2023-08-10 DIAGNOSIS — N390 Urinary tract infection, site not specified: Secondary | ICD-10-CM | POA: Diagnosis not present

## 2023-08-10 DIAGNOSIS — Z299 Encounter for prophylactic measures, unspecified: Secondary | ICD-10-CM | POA: Diagnosis not present

## 2023-08-10 DIAGNOSIS — E1169 Type 2 diabetes mellitus with other specified complication: Secondary | ICD-10-CM | POA: Diagnosis not present

## 2023-08-10 DIAGNOSIS — I4891 Unspecified atrial fibrillation: Secondary | ICD-10-CM | POA: Diagnosis not present

## 2023-08-10 DIAGNOSIS — I10 Essential (primary) hypertension: Secondary | ICD-10-CM | POA: Diagnosis not present

## 2023-08-10 DIAGNOSIS — R52 Pain, unspecified: Secondary | ICD-10-CM | POA: Diagnosis not present

## 2023-08-10 MED ORDER — GENTAMICIN SULFATE 0.1 % EX CREA: TOPICAL_CREAM | CUTANEOUS | 1 refills | Status: DC

## 2023-08-10 NOTE — Progress Notes (Signed)
 Subjective:  Patient ID: [name] [name], female    DOB: 03-Mar-1941,  MRN: [id number]  [name] [name] presents to clinic today for painful, elongated thickened toenails x 10 which are symptomatic when wearing enclosed shoe gear. This interferes with his/her daily activities.  Chief Complaint  Patient presents with   Nail Problem    Pt is here for [facility] PCP is Dr [name] [name] and LOV was in February.   New problem(s):Patient presents with tender right 2nd digit. States it seems her toes are more contracted on her right foot. It has been difficult for her to keep padding between right hallux and right 2nd digit. She denies any drainage from the toe.    PCP is [name] Fix, MD.  Allergies  Allergen Reactions   Bupropion Hcl Other (See Comments)    Suicidal Thoughts.    Escitalopram Oxalate Other (See Comments)    Suicidal Thoughts.    Fluticasone -Salmeterol Other (See Comments)    Advair - Caused patient to go into Afib.    Lisinopril Cough   Serevent Other (See Comments)    Caused patient to go into Afib    Review of Systems: Negative except as noted in the HPI.  Objective: No changes noted in today's physical examination. There were no vitals filed for this visit. [name] [name] is a pleasant 83 y.o. female obese in NAD. AAO x 3.  Vascular Examination: Vascular status intact b/l with palpable pedal pulses. CFT immediate b/l. Pedal hair present. No edema. No pain with calf compression b/l. Skin temperature gradient WNL b/l. No varicosities noted. No cyanosis or clubbing noted.  Neurological Examination: Pt has subjective symptoms of neuropathy. Sensation grossly intact b/l with 10 gram monofilament. Vibratory sensation intact b/l.  Dermatological Examination: Pedal skin with normal turgor, texture and tone b/l. No open wounds nor interdigital macerations noted. Toenails 1-5 b/l thick, discolored, elongated with subungual debris and pain on dorsal palpation.   Wound  Location: right second digit There is a minimal amount of devitalized tissue present in the wound. Predebridement Wound Measurement:  0.5  x 0.4 x 0 cm with hemorrhagic hyperkeratotic roof. Postdebridement Wound Measurement: 0.8 x 0.5 x 0.1 cm. Wound Base: pink granulation tissue Peri-wound: Normal Exudate: None: wound tissue dry Blood Loss during debridement: 0 cc('s). Sign(s) of clinical bacterial infection: no clinical signs of infection noted on examination today.    Musculoskeletal Examination: Muscle strength 5/5 to b/l LE. Hammertoes 2-5 b/l. Limited joint ROM to the 1st MPJ left foot. Pes planus deformity noted bilateral LE.  Xray findings right second digit: No gas in tissues right foot. Laterally deviated hallux with medial deviation of 1st metatarsal right foot. Contracted digits right foot. No bone erosion noted at location of ulceration R 2nd toe. No evidence of fracture right foot. Pes planus foot deformity right foot.  Assessment/Plan: 1. Pain due to onychomycosis of toenails of both feet   2. Skin ulcer of toe of right foot, limited to breakdown of skin (HCC)     Meds ordered this encounter  Medications   gentamicin cream (GARAMYCIN) 0.1 %    Sig: Apply to affected toe once daily.    Dispense:  30 g    Refill:  1  -Patient was evaluated and treated and all questions answered.  -Patient/POA/Family member educated on diagnosis and treatment plan of routine ulcer debridement/wound care.  -Ulceration debridement achieved utilizing sharp excisional debridement with sterile scalpel blade and sterile currette.. Type/amount of devitalized tissue removed: nonviable hyperkeratosis -  Today's ulcer size post-debridement: 0.8 x 0.5 x 0.1 cm. -Ulceration cleansed with wound cleanser. Betadine ointment applied to base of ulceration and secured with light dressing. -Wound responded well to today's debridement. -[name] [name] given written instructions on daily wound care  for right second digit ulceration. -Prescription written for Gentamicin Cream. Patient is to apply to right second digit once daily and cover with dressing.  -Radiology ordered today: X-Ray: right foot. -Dispensed Darco shoe for right foot. -Patient was evaluated and treated. All patient's and/or POA's questions/concerns addressed on today's visit. Toenails 1-5 debrided in length and girth without incident. Continue soft, supportive shoe gear daily. Report any pedal injuries to medical professional. Call office if there are any questions/concerns. -Dispensed tube foam. Apply to R hallux every morning. Remove every evening. -Patient/POA to call should there be question/concern in the interim.  Return in about 1 week (around 08/17/2023) for with Dr. Clydia [name] for follow up of digital ulcer. Follow up 3 months with me.[name] [name], DPM      [place] LOCATION: [address] [place] [address]                   Office [phone]   [facility] LOCATION: [address] [place], [place] [address] Office [phone]

## 2023-08-10 NOTE — Patient Instructions (Signed)
   DRESSING CHANGES RIGHT 2ND TOE:   PHARMACY SHOPPING LIST: Saline or Wound Cleanser for cleaning wound 2 x 2 inch sterile gauze for cleaning wound GENTAMICIN CREAM  A. IF DISPENSED, WEAR SURGICAL SHOE OR WALKING BOOT AT ALL TIMES.  B. IF PRESCRIBED ORAL ANTIBIOTICS, TAKE ALL MEDICATION AS PRESCRIBED UNTIL ALL ARE GONE.  C. IF DOCTOR HAS DESIGNATED NONWEIGHTBEARING STATUS, PLEASE ADHERE TO INSTRUCTIONS.  KEEP RIGHT FOOT DRY AT ALL TIMES!!!!  CLEANSE ULCER WITH SALINE OR WOUND CLEANSER.  DAB DRY WITH GAUZE SPONGE.  APPLY A LIGHT AMOUNT OF GENTAMICIN CREAM TO BASE OF ULCER.  APPLY OUTER DRESSING AS INSTRUCTED.  WEAR SURGICAL SHOE/BOOT DAILY AT ALL TIMES. IF SUPPLIED, WEAR HEEL PROTECTORS AT ALL TIMES WHEN IN BED.  DO NOT WALK BAREFOOT!!!  IF YOU EXPERIENCE ANY FEVER, CHILLS, NIGHTSWEATS, NAUSEA OR VOMITING, ELEVATED OR LOW BLOOD SUGARS, REPORT TO EMERGENCY ROOM.  IF YOU EXPERIENCE INCREASED REDNESS, PAIN, SWELLING, DISCOLORATION, ODOR, PUS, DRAINAGE OR WARMTH OF YOUR FOOT, REPORT TO EMERGENCY ROOM.

## 2023-08-11 DIAGNOSIS — M79673 Pain in unspecified foot: Secondary | ICD-10-CM | POA: Diagnosis not present

## 2023-08-11 DIAGNOSIS — M545 Low back pain, unspecified: Secondary | ICD-10-CM | POA: Diagnosis not present

## 2023-08-11 DIAGNOSIS — M6281 Muscle weakness (generalized): Secondary | ICD-10-CM | POA: Diagnosis not present

## 2023-08-14 ENCOUNTER — Other Ambulatory Visit: Payer: Self-pay | Admitting: Podiatry

## 2023-08-16 ENCOUNTER — Encounter: Payer: Medicare Other | Attending: Internal Medicine | Admitting: *Deleted

## 2023-08-16 VITALS — BP 139/70 | HR 62 | Temp 97.5°F | Resp 18

## 2023-08-16 DIAGNOSIS — J4520 Mild intermittent asthma, uncomplicated: Secondary | ICD-10-CM | POA: Diagnosis not present

## 2023-08-16 MED ORDER — MEPOLIZUMAB 100 MG ~~LOC~~ SOLR
100.0000 mg | Freq: Once | SUBCUTANEOUS | Status: AC
  Administered 2023-08-16: 100 mg via SUBCUTANEOUS

## 2023-08-16 NOTE — Progress Notes (Signed)
 Diagnosis: Asthma  Provider:   Jetty Duhamel, MD  Procedure: Injection  Nucala (Mepolizumab), Dose: 100 mg, Site: subcutaneous, Number of injections: 1  Injection Site(s): Left arm  Post Care: Observation period completed  Discharge: Condition: Good, Destination: Home . AVS Provided  Performed by:  Daleen Squibb, RN

## 2023-08-17 ENCOUNTER — Telehealth (INDEPENDENT_AMBULATORY_CARE_PROVIDER_SITE_OTHER): Payer: Self-pay | Admitting: Gastroenterology

## 2023-08-17 NOTE — Telephone Encounter (Signed)
 Who is your primary care physician: Dr.Shah  Are you diabetic? If yes, Type 1 or Type 2?    no  Do you have a prosthetic or mechanical heart valve? no  Do you have a pacemaker/defibrillator?   no  Have you had endocarditis/atrial fibrillation? yes  Have you had joint replacement within the last 12 months?  no  Do you tend to be constipated or have to use laxatives? no  Do you have any history of drugs or alchohol?  no  Do you use supplemental oxygen ?  CPAP  Have you had a stroke or heart attack within the last 6 months? no  Do you take weight loss medication?  no  For female patients: have you had a hysterectomy?  no                                     are you post menopausal?       no                                            do you still have your menstrual cycle? no      Do you take any blood-thinning medications such as: (aspirin , warfarin, Plavix, Aggrenox)  yes  If yes we need the name, milligram, dosage and who is prescribing doctor Xarelto  15 mg once daily Dr.Mealor Current Outpatient Medications on File Prior to Visit  Medication Sig Dispense Refill   antiseptic oral rinse (BIOTENE) LIQD 15 mLs by Mouth Rinse route daily as needed for dry mouth.     atorvastatin  (LIPITOR) 10 MG tablet Take 10 mg by mouth daily at 6 PM.     Azelastine  HCl 137 MCG/SPRAY SOLN PLACE TWO SPRAYS into BOTH nostrils TWICE DAILY. USE in each nostrils AS DIRECTED 30 mL 11   Biotin  1 MG CAPS Take 1,000 mg by mouth 3 (three) times a week.      budesonide  (PULMICORT ) 0.5 MG/2ML nebulizer solution Take 0.5 mg by nebulization daily.     Calcium  Carbonate-Vit D-Min (QC CALCIUM -MAGNESIUM -ZINC-D3) 333.4-133 MG-UNIT TABS Take 1 tablet by mouth 2 (two) times daily.      CARTIA  XT 120 MG 24 hr capsule TAKE ONE CAPSULE BY MOUTH AT BEDTIME 90 capsule 1   cetirizine (ZYRTEC) 10 MG tablet Take 10 mg by mouth at bedtime as needed for allergies.     diltiazem  (CARDIZEM  CD) 240 MG 24 hr capsule Take 1 capsule  (240 mg total) by mouth every morning. 90 capsule 0   diltiazem  (CARDIZEM ) 30 MG tablet TAKE 1 TABLET BY MOUTH AS NEEDED FOR PALPITATIONS 45 tablet 2   dofetilide  (TIKOSYN ) 500 MCG capsule Take 1 capsule (500 mcg total) by mouth 2 (two) times daily. 180 capsule 1   famotidine  (PEPCID ) 20 MG tablet Take 2 tablets (40 mg total) by mouth at bedtime as needed. 30 tablet 1   fluticasone  (FLONASE ) 50 MCG/ACT nasal spray Place 1 spray into both nostrils 2 (two) times daily.     gabapentin  (NEURONTIN ) 100 MG capsule TAKE ONE CAPSULE BY MOUTH AT BEDTIME 90 capsule 0   gentamicin  cream (GARAMYCIN ) 0.1 % Apply to affected toe once daily. 30 g 1   hydrALAZINE  (APRESOLINE ) 100 MG tablet Take 100 mg by mouth 2 (two) times daily.     HYDROcodone   bit-homatropine (HYCODAN) 5-1.5 MG/5ML syrup Take 5 mLs by mouth every 6 (six) hours as needed for cough.     Hypertonic Nasal Wash (SINUS RINSE) PACK Place 1 each into the nose daily as needed (clear nasal passage). Neilmed sinus rinse     ipratropium-albuterol  (DUONEB) 0.5-2.5 (3) MG/3ML SOLN Take 3 mLs by nebulization daily.     lactobacillus acidophilus (BACID) TABS tablet Take 1 tablet by mouth daily.     losartan  (COZAAR ) 100 MG tablet TAKE 1 TABLET BY MOUTH DAILY 90 tablet 1   Magnesium  250 MG TABS Take 250 mg by mouth daily.     montelukast  (SINGULAIR ) 10 MG tablet Take 10 mg by mouth at bedtime.     ondansetron  (ZOFRAN -ODT) 4 MG disintegrating tablet Take 4 mg by mouth as needed.     pantoprazole  (PROTONIX ) 40 MG tablet Take 40 mg by mouth 2 (two) times daily.     Polyethyl Glyc-Propyl Glyc PF (SYSTANE ULTRA PF) 0.4-0.3 % SOLN Place 1 drop into both eyes as needed (dry eyes).     potassium chloride  (KLOR-CON ) 10 MEQ tablet Take 1 tablet (10 mEq total) by mouth daily. 90 tablet 2   Rivaroxaban  (XARELTO ) 15 MG TABS tablet Take 1 tablet (15 mg total) by mouth daily with supper. 90 tablet 1   spironolactone  (ALDACTONE ) 25 MG tablet TAKE 1/2 TABLET BY MOUTH DAILY 45  tablet 2   umeclidinium-vilanterol (ANORO ELLIPTA ) 62.5-25 MCG/ACT AEPB INHALE ONE PUFF INTO THE LUNGS DAILY 3 each 3   Wheat Dextrin (BENEFIBER ON THE GO) POWD Take 10 mLs by mouth daily.     White Petrolatum-Mineral Oil (SYSTANE NIGHTTIME) OINT Place 1 drop into both eyes at bedtime.     Current Facility-Administered Medications on File Prior to Visit  Medication Dose Route Frequency Provider Last Rate Last Admin   methylPREDNISolone  acetate (DEPO-MEDROL ) injection 80 mg  80 mg Intramuscular Once Rosa [employer] D, MD        Allergies  Allergen Reactions   Bupropion Hcl Other (See Comments)    Suicidal Thoughts.    Escitalopram Oxalate Other (See Comments)    Suicidal Thoughts.    Fluticasone -Salmeterol Other (See Comments)    Advair - Caused patient to go into Afib.    Lisinopril Cough   Serevent Other (See Comments)    Caused patient to go into Afib     Pharmacy:   Primary Insurance Name: Medicare/BCBS  Best number where you can be reached: [phone]

## 2023-08-19 ENCOUNTER — Ambulatory Visit (INDEPENDENT_AMBULATORY_CARE_PROVIDER_SITE_OTHER): Admitting: Podiatry

## 2023-08-19 DIAGNOSIS — L84 Corns and callosities: Secondary | ICD-10-CM | POA: Diagnosis not present

## 2023-08-19 NOTE — Progress Notes (Unsigned)
 Subjective: Chief Complaint  Patient presents with   Nail Problem    Patient is here for a possible infection of right index toe.   83 year old female presents the office today for concerns of pain to the right second toe.  She was last seen by Dr. Denece Finger and she was given ointment to help.  She states the area is still tender but she has not seen any skin breakdown or any drainage or pus.  No fevers or chills.   Objective: AAO x3, NAD DP/PT pulses palpable bilaterally, CRT less than 3 seconds On the right second toe is a hyperkeratotic lesion along the medial aspect of the second digit without any underlying ulceration, drainage or any signs of infection today.  There is mild discomfort on exam prior to debridement.  There is no skin breakdown identified otherwise.  Digital contractures present. No pain with calf compression, swelling, warmth, erythema  Assessment: Skin, preulcerative callus right second toe  Plan: -All treatment options discussed with the patient including all alternatives, risks, complications.  -Sharply debrided the lesion without any complications or bleeding.  Peers of the area is healing is no signs of infection.  Discussed offloading pads to decrease pressure to help prevent reoccurrence.  Discussed shoes to avoid excess pressure with wider toebox. -Patient encouraged to call the office with any questions, concerns, change in symptoms.   Charity Conch DPM

## 2023-08-24 ENCOUNTER — Ambulatory Visit (INDEPENDENT_AMBULATORY_CARE_PROVIDER_SITE_OTHER): Payer: Medicare Other

## 2023-08-24 DIAGNOSIS — R001 Bradycardia, unspecified: Secondary | ICD-10-CM

## 2023-08-24 LAB — CUP PACEART REMOTE DEVICE CHECK
Date Time Interrogation Session: 20250526230215
Implantable Pulse Generator Implant Date: 20220826

## 2023-08-26 NOTE — Telephone Encounter (Signed)
 Room 1, Xarelto  clearance Thanks

## 2023-08-27 ENCOUNTER — Telehealth: Payer: Self-pay | Admitting: *Deleted

## 2023-08-27 NOTE — Telephone Encounter (Signed)
 Pt has been scheduled tele preop appt 09/03/23. Med rec and consent are done.      Patient Consent for Virtual Visit        [name] [name] has provided verbal consent on 08/27/2023 for a virtual visit (video or telephone).   CONSENT FOR VIRTUAL VISIT FOR:  [name] [name]  By participating in this virtual visit I agree to the following:  I hereby voluntarily request, consent and authorize [facility] and its employed or contracted physicians, physician assistants, nurse practitioners or other licensed health care professionals (the Practitioner), to provide me with telemedicine health care services (the "Services") as deemed necessary by the treating Practitioner. I acknowledge and consent to receive the Services by the Practitioner via telemedicine. I understand that the telemedicine visit will involve communicating with the Practitioner through live audiovisual communication technology and the disclosure of certain medical information by electronic transmission. I acknowledge that I have been given the opportunity to request an in-person assessment or other available alternative prior to the telemedicine visit and am voluntarily participating in the telemedicine visit.  I understand that I have the right to withhold or withdraw my consent to the use of telemedicine in the course of my care at any time, without affecting my right to future care or treatment, and that the Practitioner or I may terminate the telemedicine visit at any time. I understand that I have the right to inspect all information obtained and/or recorded in the course of the telemedicine visit and may receive copies of available information for a reasonable fee.  I understand that some of the potential risks of receiving the Services via telemedicine include:  Delay or interruption in medical evaluation due to technological equipment failure or disruption; Information transmitted may not be sufficient (e.g. poor  resolution of images) to allow for appropriate medical decision making by the Practitioner; and/or  In rare instances, security protocols could fail, causing a breach of personal health information.  Furthermore, I acknowledge that it is my responsibility to provide information about my medical history, conditions and care that is complete and accurate to the best of my ability. I acknowledge that Practitioner's advice, recommendations, and/or decision may be based on factors not within their control, such as incomplete or inaccurate data provided by me or distortions of diagnostic images or specimens that may result from electronic transmissions. I understand that the practice of medicine is not an exact science and that Practitioner makes no warranties or guarantees regarding treatment outcomes. I acknowledge that a copy of this consent can be made available to me via my patient portal [facility] MyChart), or I can request a printed copy by calling the office of [facility].    I understand that my insurance will be billed for this visit.   I have read or had this consent read to me. I understand the contents of this consent, which adequately explains the benefits and risks of the Services being provided via telemedicine.  I have been provided ample opportunity to ask questions regarding this consent and the Services and have had my questions answered to my satisfaction. I give my informed consent for the services to be provided through the use of telemedicine in my medical care

## 2023-08-27 NOTE — Telephone Encounter (Signed)
 Pt has been scheduled tele preop appt 09/03/23. Med rec and consent are done.

## 2023-08-27 NOTE — Telephone Encounter (Signed)
   Name: [name] [name]  DOB: 07-22-1940  MRN: [id number]  Primary Cardiologist: Teddie Favre, MD   Preoperative team, please contact this patient and set up a phone call appointment for further preoperative risk assessment. Please obtain consent and complete medication review. Thank you for your help.  I confirm that guidance regarding antiplatelet and oral anticoagulation therapy has been completed and, if necessary, noted below.    I also confirmed the patient resides in the state of [place] . As per [facility] Medical Board telemedicine laws, the patient must reside in the state in which the provider is licensed.   Friddie Jetty, NP 08/27/2023, 10:26 AM [facility]

## 2023-08-27 NOTE — Telephone Encounter (Signed)
    08/27/23  [name] [name] April 10, 1940  What type of surgery is being performed? EGD  When is surgery scheduled? TBD   What type of clearance is required (medical or pharmacy to hold medication or both? MEDICATION  Are there any medications that need to be held prior to surgery and how long? XARELTO  X 2 DAYS   Name of physician performing surgery?  Dr. Samantha Cress [facility] at [facility] Phone: [phone] Fax: [phone]  Anethesia type (none, local, MAC, general)? MAC

## 2023-08-27 NOTE — Telephone Encounter (Signed)
Pharmacy please advise on holding Xarelto prior to EGD scheduled for TBD. Thank you.

## 2023-08-27 NOTE — Telephone Encounter (Signed)
Medication clearance sent  

## 2023-08-28 DIAGNOSIS — I10 Essential (primary) hypertension: Secondary | ICD-10-CM | POA: Diagnosis not present

## 2023-08-30 NOTE — Telephone Encounter (Signed)
 Patient with diagnosis of atrial fibrillation on Xarelto  for anticoagulation.    What type of surgery is being performed? EGD   When is surgery scheduled? TBD     CHA2DS2-VASc Score = 5   This indicates a 7.2% annual risk of stroke. The patient's score is based upon: CHF History: 0 HTN History: 1 Diabetes History: 0 Stroke History: 0 Vascular Disease History: 1 Age Score: 2 Gender Score: 1    CrCl 49 Platelet count 211  Patient has not had an Afib/aflutter ablation within the last 3 months or DCCV within the last 30 days  Per office protocol, patient can hold Xarelto  for 2 days prior to procedure.   Patient will not need bridging with Lovenox (enoxaparin) around procedure.  **This guidance is not considered finalized until pre-operative APP has relayed final recommendations.**

## 2023-09-01 ENCOUNTER — Ambulatory Visit: Payer: Self-pay | Admitting: Cardiovascular Disease

## 2023-09-02 NOTE — Addendum Note (Signed)
 Addended by: Edra Govern D on: 09/02/2023 05:42 PM   Modules accepted: Orders

## 2023-09-02 NOTE — Progress Notes (Signed)
 [facility] Summary Report / Loop Recorder

## 2023-09-03 ENCOUNTER — Ambulatory Visit: Attending: Cardiology | Admitting: Emergency Medicine

## 2023-09-03 DIAGNOSIS — Z01810 Encounter for preprocedural cardiovascular examination: Secondary | ICD-10-CM | POA: Diagnosis not present

## 2023-09-03 DIAGNOSIS — M79673 Pain in unspecified foot: Secondary | ICD-10-CM | POA: Diagnosis not present

## 2023-09-03 DIAGNOSIS — M545 Low back pain, unspecified: Secondary | ICD-10-CM | POA: Diagnosis not present

## 2023-09-03 DIAGNOSIS — M6281 Muscle weakness (generalized): Secondary | ICD-10-CM | POA: Diagnosis not present

## 2023-09-03 NOTE — Progress Notes (Signed)
 Virtual Visit via Telephone Note   Because of [name] co-morbid illnesses, she is at least at moderate risk for complications without adequate follow up.  This format is felt to be most appropriate for this patient at this time.  Due to technical limitations with video connection (technology), today's appointment will be conducted as an audio only telehealth visit, and MARCHELL [name] verbally agreed to proceed in this manner.   All issues noted in this document were discussed and addressed.  No physical exam could be performed with this format.  Evaluation Performed:  Preoperative cardiovascular risk assessment _____________   Date:  09/03/2023   Patient ID:  [name] [name], DOB 1940-04-24, MRN [id number] Patient Location:  Home Provider location:   Office  Primary Care Provider:  Theoplis Fix, MD Primary Cardiologist:  Teddie Favre, MD  Chief Complaint / Patient Profile   83 y.o. y/o female with a h/o paroxysmal atrial fibrillation, atrial flutter, COPD, OSA who is pending EGD with [facility] gastroenterology at [facility] on date TBD and presents today for telephonic preoperative cardiovascular risk assessment.  History of Present Illness    [name] [name] is a 83 y.o. female who presents via audio/video conferencing for a telehealth visit today.  Pt was last seen in cardiology clinic on 07/02/2023 by Dr. Arlester Ladd.  At that time MARLAYNA BANNISTER was doing well.  The patient is now pending procedure as outlined above. Since her last visit, she denies chest pain, lower extremity edema, fatigue, palpitations, melena, hematuria, hemoptysis, diaphoresis, weakness, presyncope, syncope, orthopnea, and PND.  Today patient is doing well overall.  She is without any acute cardiovascular concerns or complaints at this time.  Denies any chest pains or any other exertional/anginal symptoms.  Notes she has chronic SOB however this has been well-controlled.  Denies any lower  extremity swelling.  She notes that her atrial fibrillation has been well-controlled with average heart rates in the 60s to 70s.  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  Overall she is able to complete greater than 4 METS without limitation.  Past Medical History    Past Medical History:  Diagnosis Date   Allergic rhinitis    Anxiety    Asthma    Atrial flutter (HCC)    COPD (chronic obstructive pulmonary disease) (HCC)    Depression    Essential hypertension    Hyperlipidemia    Lumbar disc disease    Obesity    OSA (obstructive sleep apnea)    CPAP   Paroxysmal atrial fibrillation (HCC)    Element of tachycardia bradycardia syndrome   Respiratory failure (HCC)    Past Surgical History:  Procedure Laterality Date   ANTERIOR FUSION LUMBAR SPINE     ATRIAL FIBRILLATION ABLATION N/A 09/12/2020   Procedure: ATRIAL FIBRILLATION ABLATION;  Surgeon: Jolly Needle, MD;  Location: [facility] INVASIVE CV LAB;  Service: Cardiovascular;  Laterality: N/A;   ATRIAL FIBRILLATION ABLATION N/A 03/18/2022   Procedure: ATRIAL FIBRILLATION ABLATION;  Surgeon: Efraim Grange, MD;  Location: [facility] INVASIVE CV LAB;  Service: Cardiovascular;  Laterality: N/A;   BIOPSY  08/27/2016   Procedure: BIOPSY;  Surgeon: Ruby Corporal, MD;  Location: [facility] ENDO SUITE;  Service: Endoscopy;;  FOUR GASTRIC POLYPS BIOPSIED   CARDIOVERSION N/A 03/28/2014   Procedure: CARDIOVERSION;  Surgeon: Elmyra Haggard, MD;  Location: [facility]  ORS;  Service: Cardiovascular;  Laterality: N/A;   CARDIOVERSION N/A 10/22/2016   Procedure: CARDIOVERSION;  Surgeon: Jacqueline Matsu, MD;  Location: [facility] ENDOSCOPY;  Service: Cardiovascular;  Laterality: N/A;   CARDIOVERSION N/A 11/26/2020   Procedure: CARDIOVERSION;  Surgeon: Elmyra Haggard, MD;  Location: [facility] ENDOSCOPY;  Service: Cardiovascular;  Laterality: N/A;   CATARACT  EXTRACTION     COLONOSCOPY N/A 08/04/2012   Procedure: COLONOSCOPY;  Surgeon: Ruby Corporal, MD;  Location: [facility] ENDO [facility];  Service: Endoscopy;  Laterality: N/A;  730-rescheduled to 1030 Ann notified pt   ELECTROPHYSIOLOGIC STUDY N/A 09/18/2014   Procedure: Atrial Fibrillation Ablation;  Surgeon: Jolly Needle, MD;  Location: [facility] INVASIVE CV LAB;  Service: Cardiovascular;  Laterality: N/A;   ESOPHAGOGASTRODUODENOSCOPY N/A 08/27/2016   Procedure: ESOPHAGOGASTRODUODENOSCOPY (EGD);  Surgeon: Ruby Corporal, MD;  [facility];  Service: Endoscopy;  Laterality: N/A;  1200   ESOPHAGOGASTRODUODENOSCOPY (EGD) WITH PROPOFOL  N/A 06/08/2023   Procedure: ESOPHAGOGASTRODUODENOSCOPY (EGD) WITH PROPOFOL ;  Surgeon: Urban Garden, MD;  Location: [facility] ENDO [facility];  Service: Gastroenterology;  Laterality: N/A;  1:00PM;ASA 3   implantable loop recorder placement  11/22/2020   Medtronic Reveal Linq model LNQ 11 (SN [id number]) implantable loop recorder   LASIK     Both eyes   TEE WITHOUT CARDIOVERSION N/A 09/18/2014   Procedure: TRANSESOPHAGEAL ECHOCARDIOGRAM (TEE);  Surgeon: Darlis Eisenmenger, MD;  Location: [facility] ENDOSCOPY;  Service: Cardiovascular;  Laterality: N/A;   TEE WITHOUT CARDIOVERSION N/A 10/22/2016   Procedure: TRANSESOPHAGEAL ECHOCARDIOGRAM (TEE);  Surgeon: Jacqueline Matsu, MD;  Location: [facility] ENDOSCOPY;  Service: Cardiovascular;  Laterality: N/A;   TEE WITHOUT CARDIOVERSION N/A 08/24/2017   Procedure: TRANSESOPHAGEAL ECHOCARDIOGRAM (TEE);  Surgeon: Loyde Rule, MD;  Location: [facility] ENDOSCOPY;  Service: Cardiovascular;  Laterality: N/A;   TOTAL HIP ARTHROPLASTY  03/30/2008   Right    Allergies  Allergies  Allergen Reactions   Bupropion Hcl Other (See Comments)    Suicidal Thoughts.    Escitalopram Oxalate Other (See Comments)    Suicidal Thoughts.    Fluticasone -Salmeterol Other (See Comments)    Advair - Caused patient to go into Afib.    Lisinopril Cough   Serevent Other  (See Comments)    Caused patient to go into Afib    Home Medications    Prior to Admission medications   Medication Sig Start Date End Date Taking? Authorizing Provider  antiseptic oral rinse (BIOTENE) LIQD 15 mLs by Mouth Rinse route daily as needed for dry mouth.    [provider]  atorvastatin  (LIPITOR) 10 MG tablet Take 10 mg by mouth daily at 6 PM.    [provider]  Azelastine  HCl 137 MCG/SPRAY SOLN PLACE TWO SPRAYS into BOTH nostrils TWICE DAILY. USE in each nostrils AS DIRECTED 01/09/23   Antonio Baumgarten, NP  Biotin  1 MG CAPS Take 1,000 mg by mouth 3 (three) times a week.     [provider]  budesonide  (PULMICORT ) 0.5 MG/2ML nebulizer solution Take 0.5 mg by nebulization daily. 12/18/21   [provider]  Calcium  Carbonate-Vit D-Min (QC CALCIUM -MAGNESIUM -ZINC-D3) 333.4-133 MG-UNIT TABS Take 1 tablet by mouth 2 (two) times daily.     [provider]  CARTIA  XT 120 MG 24 hr capsule TAKE ONE CAPSULE BY MOUTH AT BEDTIME 06/09/23   Gerard Knight, MD  cetirizine (ZYRTEC) 10 MG tablet Take 10 mg by mouth at bedtime as needed for allergies.    [provider]  diltiazem  (CARDIZEM   CD) 240 MG 24 hr capsule Take 1 capsule (240 mg total) by mouth every morning. 12/17/22   Gerard Knight, MD  diltiazem  (CARDIZEM ) 30 MG tablet TAKE 1 TABLET BY MOUTH AS NEEDED FOR PALPITATIONS 07/02/20   Carroll, Donna C, NP  dofetilide  (TIKOSYN ) 500 MCG capsule Take 1 capsule (500 mcg total) by mouth 2 (two) times daily. 03/08/23   Nathanel Bal, PA-C  famotidine  (PEPCID ) 20 MG tablet Take 2 tablets (40 mg total) by mouth at bedtime as needed. 09/16/20   Ruby Corporal, MD  fluticasone  (FLONASE ) 50 MCG/ACT nasal spray Place 1 spray into both nostrils 2 (two) times daily.    [provider]  gabapentin  (NEURONTIN ) 100 MG capsule TAKE ONE CAPSULE BY MOUTH AT BEDTIME 08/15/23   Charity Conch, DPM  gentamicin  cream (GARAMYCIN ) 0.1 % Apply to  affected toe once daily. 08/10/23   Luella Sager, DPM  hydrALAZINE  (APRESOLINE ) 100 MG tablet Take 100 mg by mouth 2 (two) times daily. 08/31/19   [provider]  HYDROcodone  bit-homatropine (HYCODAN) 5-1.5 MG/5ML syrup Take 5 mLs by mouth every 6 (six) hours as needed for cough.    [provider]  Hypertonic Nasal Wash (SINUS RINSE) PACK Place 1 each into the nose daily as needed (clear nasal passage). Neilmed sinus rinse    [provider]  ipratropium-albuterol  (DUONEB) 0.5-2.5 (3) MG/3ML SOLN Take 3 mLs by nebulization daily.    [provider]  lactobacillus acidophilus (BACID) TABS tablet Take 1 tablet by mouth daily.    [provider]  losartan  (COZAAR ) 100 MG tablet TAKE 1 TABLET BY MOUTH DAILY 05/26/23   Gerard Knight, MD  Magnesium  250 MG TABS Take 250 mg by mouth daily.    [provider]  montelukast  (SINGULAIR ) 10 MG tablet Take 10 mg by mouth at bedtime.    [provider]  ondansetron  (ZOFRAN -ODT) 4 MG disintegrating tablet Take 4 mg by mouth as needed. 11/18/22   [provider]  pantoprazole  (PROTONIX ) 40 MG tablet Take 40 mg by mouth 2 (two) times daily.    [provider]  Polyethyl Glyc-Propyl Glyc PF (SYSTANE ULTRA PF) 0.4-0.3 % SOLN Place 1 drop into both eyes as needed (dry eyes).    [provider]  potassium chloride  (KLOR-CON ) 10 MEQ tablet Take 1 tablet (10 mEq total) by mouth daily. 07/01/23   Gerard Knight, MD  Rivaroxaban  (XARELTO ) 15 MG TABS tablet Take 1 tablet (15 mg total) by mouth daily with supper. 05/11/23   Gerard Knight, MD  spironolactone  (ALDACTONE ) 25 MG tablet TAKE 1/2 TABLET BY MOUTH DAILY 06/25/23   Gerard Knight, MD  umeclidinium-vilanterol (ANORO ELLIPTA ) 62.5-25 MCG/ACT AEPB INHALE ONE PUFF INTO THE LUNGS DAILY 06/18/23   Rosa College D, MD  Wheat Dextrin (BENEFIBER ON THE GO) POWD Take 10 mLs by mouth daily.    [provider]   White Petrolatum-Mineral Oil (SYSTANE NIGHTTIME) OINT Place 1 drop into both eyes at bedtime.    [provider]    Physical Exam    Vital Signs:  GARRY BOCHICCHIO does not have vital signs available for review today.  Given telephonic nature of communication, physical exam is limited. AAOx3. NAD. Normal affect.  Speech and respirations are unlabored.  Accessory Clinical Findings    None  Assessment & Plan    1.  Preoperative Cardiovascular Risk Assessment: According to the Revised Cardiac Risk Index (RCRI), her Perioperative Risk of Major  Cardiac Event is (%): 0.4. Her Functional Capacity in METs is: 5.62 according to the Duke Activity Status Index (DASI). Therefore, based on ACC/AHA guidelines, patient would be at acceptable risk for the planned procedure without further cardiovascular testing.   The patient was advised that if she develops new symptoms prior to surgery to contact our office to arrange for a follow-up visit, and she verbalized understanding.  Patient has not had an Afib/aflutter ablation within the last 3 months or DCCV within the last 30 days   Per office protocol, patient can hold Xarelto  for 2 days prior to procedure.   Patient will not need bridging with Lovenox (enoxaparin) around procedure.  A copy of this note will be routed to requesting surgeon.  Time:   Today, I have spent 8 minutes with the patient with telehealth technology discussing medical history, symptoms, and management plan.     Ava Boatman, NP  09/03/2023, 9:54 AM

## 2023-09-05 ENCOUNTER — Other Ambulatory Visit (HOSPITAL_COMMUNITY): Payer: Self-pay | Admitting: Internal Medicine

## 2023-09-06 NOTE — Telephone Encounter (Signed)
 Per Cardiology "Per office protocol, patient can hold Xarelto  for 2 days prior to procedure.   Patient will not need bridging with Lovenox (enoxaparin) around procedure.   A copy of this note will be routed to requesting surgeon."

## 2023-09-06 NOTE — Telephone Encounter (Signed)
 Thanks, please update the patient about these recs

## 2023-09-06 NOTE — Telephone Encounter (Signed)
 Pt called back. Scheduled for 7/9. Aware when to hold xarelto  x 2 days prior. Will mail instructions.

## 2023-09-06 NOTE — Telephone Encounter (Signed)
 Referral completed, TCS apt letter sent to PCP

## 2023-09-06 NOTE — Telephone Encounter (Signed)
 Called pt, she is aware clearance received. She stated she will call back when she gets home to schedule

## 2023-09-08 DIAGNOSIS — J329 Chronic sinusitis, unspecified: Secondary | ICD-10-CM | POA: Diagnosis not present

## 2023-09-08 DIAGNOSIS — I10 Essential (primary) hypertension: Secondary | ICD-10-CM | POA: Diagnosis not present

## 2023-09-08 DIAGNOSIS — J449 Chronic obstructive pulmonary disease, unspecified: Secondary | ICD-10-CM | POA: Diagnosis not present

## 2023-09-08 DIAGNOSIS — Z299 Encounter for prophylactic measures, unspecified: Secondary | ICD-10-CM | POA: Diagnosis not present

## 2023-09-09 ENCOUNTER — Ambulatory Visit (INDEPENDENT_AMBULATORY_CARE_PROVIDER_SITE_OTHER): Payer: Medicare Other | Admitting: Gastroenterology

## 2023-09-09 ENCOUNTER — Encounter (INDEPENDENT_AMBULATORY_CARE_PROVIDER_SITE_OTHER): Payer: Self-pay | Admitting: Gastroenterology

## 2023-09-09 ENCOUNTER — Encounter: Payer: Self-pay | Admitting: Internal Medicine

## 2023-09-09 VITALS — BP 123/71 | HR 55 | Temp 97.5°F | Ht 61.5 in | Wt 199.1 lb

## 2023-09-09 DIAGNOSIS — D649 Anemia, unspecified: Secondary | ICD-10-CM

## 2023-09-09 DIAGNOSIS — K219 Gastro-esophageal reflux disease without esophagitis: Secondary | ICD-10-CM

## 2023-09-09 DIAGNOSIS — R058 Other specified cough: Secondary | ICD-10-CM | POA: Diagnosis not present

## 2023-09-09 NOTE — H&P (View-Only) (Signed)
 Referring Provider: Theoplis Fix, MD Primary Care Physician:  Theoplis Fix, MD Primary GI Physician: Dr. Sammi Crick   Chief Complaint  Patient presents with   Follow-up    Patient here today due to a follow up on Gerd. Patient says the gerd is better but still having some occasional issues with it. She has not been coughing as much as of late. She is taking Pantoprazole  40 mg bid, and famotidine  20 mg two at bedtime prn. Still having some nausea at times.   HPI:   [name] [name] is a 83 y.o. female with past medical history of  anxiety, asthma, A flutter, COPD, depression, HTN, HLD, OSA.     Patient presenting today for:  Follow up of GERD/atypical cough IDA  Last seen 05/2023, at that time reported recent strep throat with multiple rounds of steroids, abx. Feeling some better. Some improvement in cough since being on steroids/abx. No dysphagia, though sometimes can feel food passing in the back of her throat/chest. More sinus drainage recently. PCP had changed omeprazole  to Protonix  40mg  BID but she noted little improvement in symptoms  Recommended to continue protonix  40mg  BID, check iron studies, schedule EGD, if iron levels low, will also need TCS  March labs:  Hgb 11.2, iron 74, TIBC 344, sat 22, ferritin 112  Hgb 11.7 in may (range 11-15 on reading lab)   EGD 06/08/23: - Normal esophagus. - Multiple bleeding gastric polyps. Nine resected and retrieved. Clip manufacturer: [employer]. Clips (MR safe) were placed. Injected.  - Otherwise normal stomach. Biopsied.  - Normal examined duodenum. A. STOMACH, POLYPECTOMY:  - Benign gastric hyperplastic/ juvenile polyp with ulceration and  reactive changes  - Negative for intestinal metaplasia, dysplasia or carcinoma  B. STOMACH, BIOPSY:  - Gastric oxyntic mucosa with parietal cell hyperplasia as can be seen  in hypergastrinemic states such as PPI therapy.  - Gastric antral mucosa with no specific histopathologic changes   - Helicobacter pylori-like organisms are not identified on routine HE  stain   recommended repeat EGD in 3 months, scheduled for 7/9  Present: States she has had some nausea but thinks she has a sinus infection and recently started on augmentin . Using zofran  sparingly.  she has more drainage which has worsened coughing. Very occasional GERD symptoms. She does famotidine  PRN in the evenings. Throat is more sore recently and more hoarse right now due to sinus infection as well. She has upcoming appt with ENT soon. Swallowing doing better. Generally has no issues except on occasion when she takes a handful of pills. Appetite was good until recent sinus infection. Had some constipation last week and then took miralax which gave her diarrhea a few days later but this has resolved. Generally does well with normal BMs with benefiber and good water  intake. She had a flare of her hemorrhoids earlier this week when she had a lot of diarrhea but this has resolved. No melena. She Is not taking iron pill   Last colonoscopy: 06/09/12 Examination performed to cecum. Few small diverticula at sigmoid colon. Small external hemorrhoids. SLP swallow study 02/22/2023 which showed normal oropharyngeal swallow with prompt swallow trigger/initiation at the level of the valleculae, adequate hyolaryngeal excursion, no penetration/aspiration, and no significant residuals post swallow. Pt does have suspected cervical osteophytes and prominent cricopharyngeus, which did not impede bolus flow during this study. Esophageal sweep revealed some delayed distal esophageal emptying with some retrograde movement, but did eventually clear. This study was reviewed with Pt. While we were  talking, she had several strong coughing episodes and then expectorated clear phlegm. Pt also reports a change in her singing range. She was given written reflux precautions and encouraged to replace coughing episodes with a sip of water  or dry swallow when  possible (to avoid excessive strain on her vocal folds). Pt already elevates the head of her bed and consumes frequent, smaller meals. She indicates that she notes increased coughing with cornbread and dry crackers, so SLP suggested adding small amount of moisture (she also has xerostomia). Recommend regular textures, thin liquids via cup/straw, and PO medications whole with water . No further SLP services indicated at this time.  Recommended possible ENT referral if symptoms persisted.    Past Medical History:  Diagnosis Date   Allergic rhinitis    Anxiety    Asthma    Atrial flutter (HCC)    COPD (chronic obstructive pulmonary disease) (HCC)    Depression    Essential hypertension    Hyperlipidemia    Lumbar disc disease    Obesity    OSA (obstructive sleep apnea)    CPAP   Paroxysmal atrial fibrillation (HCC)    Element of tachycardia bradycardia syndrome   Respiratory failure (HCC)     Past Surgical History:  Procedure Laterality Date   ANTERIOR FUSION LUMBAR SPINE     ATRIAL FIBRILLATION ABLATION N/A 09/12/2020   Procedure: ATRIAL FIBRILLATION ABLATION;  Surgeon: Jolly Needle, MD;  Location: [facility];  Service: Cardiovascular;  Laterality: N/A;   ATRIAL FIBRILLATION ABLATION N/A 03/18/2022   Procedure: ATRIAL FIBRILLATION ABLATION;  Surgeon: Efraim Grange, MD;  Location: [facility]AS[facility] CV LAB;  Service: Cardiovascular;  Laterality: N/A;   BIOPSY  08/27/2016   Procedure: BIOPSY;  Surgeon: Ruby Corporal, MD;  Location: [facility];  Service: Endoscopy;;  FOUR GASTRIC POLYPS BIOPSIED   CARDIOVERSION N/A 03/28/2014   Procedure: CARDIOVERSION;  Surgeon: Elmyra Haggard, MD;  Location: [facility] ORS;  Service: Cardiovascular;  Laterality: N/A;   CARDIOVERSION N/A 10/22/2016   Procedure: CARDIOVERSION;  Surgeon: Jacqueline Matsu, MD;  Location[facility] ENDOSCOPY;  Service: Cardiovascular;  Laterality: N/A;   CARDIOVERSION N/A 11/26/2020   Procedure: CARDIOVERSION;  Surgeon: Elmyra Haggard, MD;  Location: [facility] ENDOSCOPY;  Service: Cardiovascular;  Laterality: N/A;   CATARACT EXTRACTION     COLONOSCOPY N/A 08/04/2012   Procedure: COLONOSCOPY;  Surgeon: Ruby Corporal, MD;  Location: [facility];  Service: Endoscopy;  Laterality: N/A;  730-rescheduled to 1030 Ann notified pt   ELECTROPHYSIOLOGIC STUDY N/A 09/18/2014   Procedure: Atrial Fibrillation Ablation;  Surgeon: Jolly Needle, MD;  Location: [facility] INVASIVE CV LAB;  Service: Cardiovascular;  Laterality: N/A;   ESOPHAGOGASTRODUODENOSCOPY N/A 08/27/2016   Procedure: ESOPHAGOGASTRODUODENOSCOPY (EGD);  Surgeon: Ruby Corporal, MD;  [facility]: [facility];  Service: Endoscopy;  Laterality: N/A;  1200   ESOPHAGOGASTRODUODENOSCOPY (EGD) WITH PROPOFOL  N/A 06/08/2023   Procedure: ESOPHAGOGASTRODUODENOSCOPY (EGD) WITH PROPOFOL ;  Surgeon: Urban Garden, MD;  Location: [facility];  Service: Gastroenterology;  Laterality: N/A;  1:00PM;ASA 3   implantable loop recorder placement  11/22/2020   Medtronic Reveal Linq model LNQ 11 (SN [id number]) implantable loop recorder   LASIK     Both eyes   TEE WITHOUT CARDIOVERSION N/A 09/18/2014   Procedure: TRANSESOPHAGEAL ECHOCARDIOGRAM (TEE);  Surgeon: Darlis Eisenmenger, MD;  Location: [facility] ENDOSCOPY;  Service: Cardiovascular;  Laterality: N/A;   TEE WITHOUT CARDIOVERSION N/A 10/22/2016   Procedure: TRANSESOPHAGEAL ECHOCARDIOGRAM (TEE);  Surgeon: Gaylyn Keas  R, MD;  Location: [facility] ENDOSCOPY;  Service: Cardiovascular;  Laterality: N/A;   TEE WITHOUT CARDIOVERSION N/A 08/24/2017   Procedure: TRANSESOPHAGEAL ECHOCARDIOGRAM (TEE);  Surgeon: Loyde Rule, MD;  Location: [facility] ENDOSCOPY;  Service: Cardiovascular;  Laterality: N/A;   TOTAL HIP ARTHROPLASTY  03/30/2008   Right    Current Outpatient Medications  Medication Sig Dispense Refill   antiseptic oral rinse (BIOTENE) LIQD 15 mLs by Mouth Rinse route daily as needed for dry mouth.     atorvastatin  (LIPITOR) 10 MG tablet Take 10  mg by mouth daily at 6 PM.     Azelastine  HCl 137 MCG/SPRAY SOLN PLACE TWO SPRAYS into BOTH nostrils TWICE DAILY. USE in each nostrils AS DIRECTED 30 mL 11   Biotin  1 MG CAPS Take 1,000 mg by mouth 3 (three) times a week.      budesonide  (PULMICORT ) 0.5 MG/2ML nebulizer solution Take 0.5 mg by nebulization daily.     Calcium  Carbonate-Vit D-Min (QC CALCIUM -MAGNESIUM -ZINC-D3) 333.4-133 MG-UNIT TABS Take 1 tablet by mouth 2 (two) times daily.      CARTIA  XT 120 MG 24 hr capsule TAKE ONE CAPSULE BY MOUTH AT BEDTIME 90 capsule 1   cetirizine (ZYRTEC) 10 MG tablet Take 10 mg by mouth at bedtime as needed for allergies.     diltiazem  (CARDIZEM  CD) 240 MG 24 hr capsule Take 1 capsule (240 mg total) by mouth every morning. 90 capsule 0   diltiazem  (CARDIZEM ) 30 MG tablet TAKE 1 TABLET BY MOUTH AS NEEDED FOR PALPITATIONS 45 tablet 2   fluticasone  (FLONASE ) 50 MCG/ACT nasal spray Place 1 spray into both nostrils 2 (two) times daily. (Patient taking differently: Place 1 spray into both nostrils daily.)     dofetilide  (TIKOSYN ) 500 MCG capsule Take 1 capsule (500 mcg total) by mouth 2 (two) times daily. 180 capsule 3   famotidine  (PEPCID ) 20 MG tablet Take 2 tablets (40 mg total) by mouth at bedtime as needed. 30 tablet 1   gabapentin  (NEURONTIN ) 100 MG capsule TAKE ONE CAPSULE BY MOUTH AT BEDTIME 90 capsule 0   gentamicin  cream (GARAMYCIN ) 0.1 % Apply to affected toe once daily. 30 g 1   hydrALAZINE  (APRESOLINE ) 100 MG tablet Take 100 mg by mouth 2 (two) times daily.     HYDROcodone  bit-homatropine (HYCODAN) 5-1.5 MG/5ML syrup Take 5 mLs by mouth every 6 (six) hours as needed for cough.     Hypertonic Nasal Wash (SINUS RINSE) PACK Place 1 each into the nose daily as needed (clear nasal passage). Neilmed sinus rinse     ipratropium-albuterol  (DUONEB) 0.5-2.5 (3) MG/3ML SOLN Take 3 mLs by nebulization daily.     lactobacillus acidophilus (BACID) TABS tablet Take 1 tablet by mouth daily.     losartan  (COZAAR )  100 MG tablet TAKE 1 TABLET BY MOUTH DAILY 90 tablet 1   Magnesium  250 MG TABS Take 250 mg by mouth daily.     montelukast  (SINGULAIR ) 10 MG tablet Take 10 mg by mouth at bedtime.     ondansetron  (ZOFRAN -ODT) 4 MG disintegrating tablet Take 4 mg by mouth as needed.     pantoprazole  (PROTONIX ) 40 MG tablet Take 40 mg by mouth 2 (two) times daily.     Polyethyl Glyc-Propyl Glyc PF (SYSTANE ULTRA PF) 0.4-0.3 % SOLN Place 1 drop into both eyes as needed (dry eyes).     potassium chloride  (KLOR-CON ) 10 MEQ tablet Take 1 tablet (10 mEq total) by mouth daily. 90 tablet 2   Rivaroxaban  (XARELTO ) 15 MG  TABS tablet Take 1 tablet (15 mg total) by mouth daily with supper. 90 tablet 1   spironolactone  (ALDACTONE ) 25 MG tablet TAKE 1/2 TABLET BY MOUTH DAILY 45 tablet 2   umeclidinium-vilanterol (ANORO ELLIPTA ) 62.5-25 MCG/ACT AEPB INHALE ONE PUFF INTO THE LUNGS DAILY 3 each 3   Wheat Dextrin (BENEFIBER ON THE GO) POWD Take 10 mLs by mouth daily.     White Petrolatum-Mineral Oil (SYSTANE NIGHTTIME) OINT Place 1 drop into both eyes at bedtime.     Current Facility-Administered Medications  Medication Dose Route Frequency Provider Last Rate Last Admin   methylPREDNISolone  acetate (DEPO-MEDROL ) injection 80 mg  80 mg Intramuscular Once Rosa College D, MD        Allergies as of 09/09/2023 - Review Complete 09/09/2023  Allergen Reaction Noted   Bupropion hcl Other (See Comments)    Escitalopram oxalate Other (See Comments)    Fluticasone -salmeterol Other (See Comments)    Lisinopril Cough    Serevent Other (See Comments) 05/06/2011    Social History   Socioeconomic History   Marital status: Single    Spouse name: Not on file   Number of children: Not on file   Years of education: Not on file   Highest education level: Not on file  Occupational History   Occupation: Retired: Engineer, site  Tobacco Use   Smoking status: Never    Passive exposure: Past   Smokeless tobacco: Never  Vaping Use    Vaping status: Never Used  Substance and Sexual Activity   Alcohol  use: No    Alcohol /week: 0.0 standard drinks of alcohol    Drug use: No   Sexual activity: Not on file  Other Topics Concern   Not on file  Social History Narrative   Pt lives in [place] alone. She was never married.   Retired Psychologist, occupational.   Attends [employer]   Social Drivers of Corporate investment banker Strain: Not on [employer] Insecurity: Not on file  Transportation Needs: Not on file  Physical Activity: Not on file  Stress: Not on file  Social Connections: Not on file    Review of systems General: negative for malaise, night sweats, fever, chills, weight loss Neck: Negative for lumps, goiter, pain and significant neck swelling Resp: Negative for wheezing, dyspnea at rest +cough  CV: Negative for chest pain, leg swelling, palpitations, orthopnea GI: denies melena, hematochezia, nausea, vomiting, diarrhea, constipation, dysphagia, odyonophagia, early satiety or unintentional weight loss. +occasional reflux  MSK: Negative for joint pain or swelling, back pain, and muscle pain. Derm: Negative for itching or rash Psych: Denies depression, anxiety, memory loss, confusion. No homicidal or suicidal ideation.  Heme: Negative for prolonged bleeding, bruising easily, and swollen nodes. Endocrine: Negative for cold or heat intolerance, polyuria, polydipsia and goiter. Neuro: negative for tremor, gait imbalance, syncope and seizures. The remainder of the review of systems is noncontributory.  Physical Exam: BP (!) 145/48 (BP Location: Left Arm, Patient Position: Sitting, Cuff Size: Large)   Pulse (!) 55   Temp (!) 97.5 F (36.4 C) (Temporal)   Ht 5' 1.5 (1.562 m)   Wt 199 lb 1.6 oz (90.3 kg)   BMI 37.01 kg/m  General:   Alert and oriented. No distress noted. Pleasant and cooperative.  Head:  Normocephalic and atraumatic. Eyes:  Conjuctiva clear without scleral icterus. Mouth:  Oral mucosa  pink and moist. Good dentition. No lesions. Heart: irregular rhythm.  Lungs: Clear lung sounds in all lobes. Respirations equal  and unlabored. Abdomen:  +BS, soft, non-tender and non-distended. No rebound or guarding. No HSM or masses noted. Derm: No palmar erythema or jaundice Msk:  Symmetrical without gross deformities. Normal posture. Extremities:  Without edema. Neurologic:  Alert and  oriented x4 Psych:  Alert and cooperative. Normal mood and affect.  Invalid input(s): 6 MONTHS   ASSESSMENT: [name] [name] is a 83 y.o. female presenting today for follow up of anemia, GERD, atypical cough  GERD/atypical cough: Ongoing with consumption of foods over the past year or so, thought secondary possibly to GERD or combination of GERD and sinus problems as she has suffered recurrent sinusitis. Reassuringly her dysphagia has improved. She feels coughing was better until recent sinus infection. She had SLP evaluation as outlined above without significant findings. Has had some improvement with PPI BID, famotidine  PRN. Recent EGD as above. She has upcoming appt with ENT. For now will continue on PPI BID as she feels this is controlling her reflux symptoms for the most part. Can use H2B PRN. Appreciate ENT insights/recommendations as well   Anemia: previous questions of iron deficiency, started on iron by PCP though most recent labs over the past few months have shown normal iron levels, hemoglobin only slightly low. No longer on iron pills. Recent EGD with bleeding polyps, likely the culprit of her anemia. She has not had any blood in stools or black stools. Has repeat EGD scheduled for 7/9.    PLAN:  -keep scheduled EGD in July -continue protonix  40mg  BID -continue famotidine  40mg  PRN  -good reflux precautions -keep appt with ENT  All questions were answered, patient verbalized understanding and is in agreement with plan as outlined above.    Follow Up: 6 months   Celestina Gironda L. Adrien Alberta,  MSN, APRN, AGNP-C Adult-Gerontology Nurse Practitioner [facility]  I have reviewed the note and agree with the APP's assessment as described in this progress note  Samantha Cress, MD Gastroenterology and Hepatology [facility] Gastroenterology

## 2023-09-09 NOTE — Patient Instructions (Signed)
-  keep scheduled EGD in July -continue protonix  40mg  twice daily  -continue famotidine  40mg  as needed at bedtime   -be mindful of greasy, spicy, fried, citrus foods, caffeine, carbonated drinks, chocolate and alcohol  as these can increase reflux symptoms Stay upright 2-3 hours after eating, prior to lying down and avoid eating late in the evenings.  Follow up 6 months  It was a pleasure to see you today. I want to create trusting relationships with patients and provide genuine, compassionate, and quality care. I truly value your feedback! please be on the lookout for a survey regarding your visit with me today. I appreciate your input about our visit and your time in completing this!    Jeremaih Klima L. Nyemah Watton, MSN, APRN, AGNP-C Adult-Gerontology Nurse Practitioner [facility] Gastroenterology at [facility]

## 2023-09-09 NOTE — Progress Notes (Addendum)
 Referring Provider: Theoplis Fix, MD Primary Care Physician:  Theoplis Fix, MD Primary GI Physician: Dr. Sammi Crick   Chief Complaint  Patient presents with   Follow-up    Patient here today due to a follow up on Gerd. Patient says the gerd is better but still having some occasional issues with it. She has not been coughing as much as of late. She is taking Pantoprazole  40 mg bid, and famotidine  20 mg two at bedtime prn. Still having some nausea at times.   HPI:   [name] [name] is a 83 y.o. female with past medical history of  anxiety, asthma, A flutter, COPD, depression, HTN, HLD, OSA.     Patient presenting today for:  Follow up of GERD/atypical cough IDA  Last seen 05/2023, at that time reported recent strep throat with multiple rounds of steroids, abx. Feeling some better. Some improvement in cough since being on steroids/abx. No dysphagia, though sometimes can feel food passing in the back of her throat/chest. More sinus drainage recently. PCP had changed omeprazole  to Protonix  40mg  BID but she noted little improvement in symptoms  Recommended to continue protonix  40mg  BID, check iron studies, schedule EGD, if iron levels low, will also need TCS  March labs:  Hgb 11.2, iron 74, TIBC 344, sat 22, ferritin 112  Hgb 11.7 in may (range 11-15 on reading lab)   EGD 06/08/23: - Normal esophagus. - Multiple bleeding gastric polyps. Nine resected and retrieved. Clip manufacturer: [employer]. Clips (MR safe) were placed. Injected.  - Otherwise normal stomach. Biopsied.  - Normal examined duodenum. A. STOMACH, POLYPECTOMY:  - Benign gastric hyperplastic/ juvenile polyp with ulceration and  reactive changes  - Negative for intestinal metaplasia, dysplasia or carcinoma  B. STOMACH, BIOPSY:  - Gastric oxyntic mucosa with parietal cell hyperplasia as can be seen  in hypergastrinemic states such as PPI therapy.  - Gastric antral mucosa with no specific histopathologic changes   - Helicobacter pylori-like organisms are not identified on routine HE  stain   recommended repeat EGD in 3 months, scheduled for 7/9  Present: States she has had some nausea but thinks she has a sinus infection and recently started on augmentin . Using zofran  sparingly.  she has more drainage which has worsened coughing. Very occasional GERD symptoms. She does famotidine  PRN in the evenings. Throat is more sore recently and more hoarse right now due to sinus infection as well. She has upcoming appt with ENT soon. Swallowing doing better. Generally has no issues except on occasion when she takes a handful of pills. Appetite was good until recent sinus infection. Had some constipation last week and then took miralax which gave her diarrhea a few days later but this has resolved. Generally does well with normal BMs with benefiber and good water  intake. She had a flare of her hemorrhoids earlier this week when she had a lot of diarrhea but this has resolved. No melena. She Is not taking iron pill   Last colonoscopy: 06/09/12 Examination performed to cecum. Few small diverticula at sigmoid colon. Small external hemorrhoids. SLP swallow study 02/22/2023 which showed normal oropharyngeal swallow with prompt swallow trigger/initiation at the level of the valleculae, adequate hyolaryngeal excursion, no penetration/aspiration, and no significant residuals post swallow. Pt does have suspected cervical osteophytes and prominent cricopharyngeus, which did not impede bolus flow during this study. Esophageal sweep revealed some delayed distal esophageal emptying with some retrograde movement, but did eventually clear. This study was reviewed with Pt. While we were  talking, she had several strong coughing episodes and then expectorated clear phlegm. Pt also reports a change in her singing range. She was given written reflux precautions and encouraged to replace coughing episodes with a sip of water  or dry swallow when  possible (to avoid excessive strain on her vocal folds). Pt already elevates the head of her bed and consumes frequent, smaller meals. She indicates that she notes increased coughing with cornbread and dry crackers, so SLP suggested adding small amount of moisture (she also has xerostomia). Recommend regular textures, thin liquids via cup/straw, and PO medications whole with water . No further SLP services indicated at this time.  Recommended possible ENT referral if symptoms persisted.    Past Medical History:  Diagnosis Date   Allergic rhinitis    Anxiety    Asthma    Atrial flutter (HCC)    COPD (chronic obstructive pulmonary disease) (HCC)    Depression    Essential hypertension    Hyperlipidemia    Lumbar disc disease    Obesity    OSA (obstructive sleep apnea)    CPAP   Paroxysmal atrial fibrillation (HCC)    Element of tachycardia bradycardia syndrome   Respiratory failure (HCC)     Past Surgical History:  Procedure Laterality Date   ANTERIOR FUSION LUMBAR SPINE     ATRIAL FIBRILLATION ABLATION N/A 09/12/2020   Procedure: ATRIAL FIBRILLATION ABLATION;  Surgeon: Jolly Needle, MD;  Location: [facility];  Service: Cardiovascular;  Laterality: N/A;   ATRIAL FIBRILLATION ABLATION N/A 03/18/2022   Procedure: ATRIAL FIBRILLATION ABLATION;  Surgeon: Efraim Grange, MD;  Location: [facility] CV [facility];  Service: Cardiovascular;  Laterality: N/A;   BIOPSY  08/27/2016   Procedure: BIOPSY;  Surgeon: Ruby Corporal, MD;  Location: [facility];  Service: Endoscopy;;  FOUR GASTRIC POLYPS BIOPSIED   CARDIOVERSION N/A 03/28/2014   Procedure: CARDIOVERSION;  Surgeon: Elmyra Haggard, MD;  Location: [facility] ORS;  Service: Cardiovascular;  Laterality: N/A;   CARDIOVERSION N/A 10/22/2016   Procedure: CARDIOVERSION;  Surgeon: Jacqueline Matsu, MD;  Location: [facility] ENDOSCOPY;  Service: Cardiovascular;  Laterality: N/A;   CARDIOVERSION N/A 11/26/2020   Procedure: CARDIOVERSION;  Surgeon: Elmyra Haggard, MD;  Location: [facility] ENDOSCOPY;  Service: Cardiovascular;  Laterality: N/A;   CATARACT EXTRACTION     COLONOSCOPY N/A 08/04/2012   Procedure: COLONOSCOPY;  Surgeon: Ruby Corporal, MD;  Location: [facility];  Service: Endoscopy;  Laterality: N/A;  730-rescheduled to 1030 Ann notified pt   ELECTROPHYSIOLOGIC STUDY N/A 09/18/2014   Procedure: Atrial Fibrillation Ablation;  Surgeon: Jolly Needle, MD;  Location: [facility] INVASIVE CV LAB;  Service: Cardiovascular;  Laterality: N/A;   ESOPHAGOGASTRODUODENOSCOPY N/A 08/27/2016   Procedure: ESOPHAGOGASTRODUODENOSCOPY (EGD);  Surgeon: Ruby Corporal, MD;  Location: [facility];  Service: Endoscopy;  Laterality: N/A;  1200   ESOPHAGOGASTRODUODENOSCOPY (EGD) WITH PROPOFOL  N/A 06/08/2023   Procedure: ESOPHAGOGASTRODUODENOSCOPY (EGD) WITH PROPOFOL ;  Surgeon: Urban Garden, MD;  Location: [facility];  Service: Gastroenterology;  Laterality: N/A;  1:00PM;ASA 3   implantable loop recorder placement  11/22/2020   Medtronic Reveal Linq model LNQ 11 (SN [id number]) implantable loop recorder   LASIK     Both eyes   TEE WITHOUT CARDIOVERSION N/A 09/18/2014   Procedure: TRANSESOPHAGEAL ECHOCARDIOGRAM (TEE);  Surgeon: Darlis Eisenmenger, MD;  Location: [facility] ENDOSCOPY;  Service: Cardiovascular;  Laterality: N/A;   TEE WITHOUT CARDIOVERSION N/A 10/22/2016   Procedure: TRANSESOPHAGEAL ECHOCARDIOGRAM (TEE);  Surgeon: Gaylyn Keas  R, MD;  Location: [facility] ENDOSCOPY;  Service: Cardiovascular;  Laterality: N/A;   TEE WITHOUT CARDIOVERSION N/A 08/24/2017   Procedure: TRANSESOPHAGEAL ECHOCARDIOGRAM (TEE);  Surgeon: Loyde Rule, MD;  Location: [facility] ENDOSCOPY;  Service: Cardiovascular;  Laterality: N/A;   TOTAL HIP ARTHROPLASTY  03/30/2008   Right    Current Outpatient Medications  Medication Sig Dispense Refill   antiseptic oral rinse (BIOTENE) LIQD 15 mLs by Mouth Rinse route daily as needed for dry mouth.     atorvastatin  (LIPITOR) 10 MG tablet Take 10  mg by mouth daily at 6 PM.     Azelastine  HCl 137 MCG/SPRAY SOLN PLACE TWO SPRAYS into BOTH nostrils TWICE DAILY. USE in each nostrils AS DIRECTED 30 mL 11   Biotin  1 MG CAPS Take 1,000 mg by mouth 3 (three) times a week.      budesonide  (PULMICORT ) 0.5 MG/2ML nebulizer solution Take 0.5 mg by nebulization daily.     Calcium  Carbonate-Vit D-Min (QC CALCIUM -MAGNESIUM -ZINC-D3) 333.4-133 MG-UNIT TABS Take 1 tablet by mouth 2 (two) times daily.      CARTIA  XT 120 MG 24 hr capsule TAKE ONE CAPSULE BY MOUTH AT BEDTIME 90 capsule 1   cetirizine (ZYRTEC) 10 MG tablet Take 10 mg by mouth at bedtime as needed for allergies.     diltiazem  (CARDIZEM  CD) 240 MG 24 hr capsule Take 1 capsule (240 mg total) by mouth every morning. 90 capsule 0   diltiazem  (CARDIZEM ) 30 MG tablet TAKE 1 TABLET BY MOUTH AS NEEDED FOR PALPITATIONS 45 tablet 2   fluticasone  (FLONASE ) 50 MCG/ACT nasal spray Place 1 spray into both nostrils 2 (two) times daily. (Patient taking differently: Place 1 spray into both nostrils daily.)     dofetilide  (TIKOSYN ) 500 MCG capsule Take 1 capsule (500 mcg total) by mouth 2 (two) times daily. 180 capsule 3   famotidine  (PEPCID ) 20 MG tablet Take 2 tablets (40 mg total) by mouth at bedtime as needed. 30 tablet 1   gabapentin  (NEURONTIN ) 100 MG capsule TAKE ONE CAPSULE BY MOUTH AT BEDTIME 90 capsule 0   gentamicin  cream (GARAMYCIN ) 0.1 % Apply to affected toe once daily. 30 g 1   hydrALAZINE  (APRESOLINE ) 100 MG tablet Take 100 mg by mouth 2 (two) times daily.     HYDROcodone  bit-homatropine (HYCODAN) 5-1.5 MG/5ML syrup Take 5 mLs by mouth every 6 (six) hours as needed for cough.     Hypertonic Nasal Wash (SINUS RINSE) PACK Place 1 each into the nose daily as needed (clear nasal passage). Neilmed sinus rinse     ipratropium-albuterol  (DUONEB) 0.5-2.5 (3) MG/3ML SOLN Take 3 mLs by nebulization daily.     lactobacillus acidophilus (BACID) TABS tablet Take 1 tablet by mouth daily.     losartan  (COZAAR )  100 MG tablet TAKE 1 TABLET BY MOUTH DAILY 90 tablet 1   Magnesium  250 MG TABS Take 250 mg by mouth daily.     montelukast  (SINGULAIR ) 10 MG tablet Take 10 mg by mouth at bedtime.     ondansetron  (ZOFRAN -ODT) 4 MG disintegrating tablet Take 4 mg by mouth as needed.     pantoprazole  (PROTONIX ) 40 MG tablet Take 40 mg by mouth 2 (two) times daily.     Polyethyl Glyc-Propyl Glyc PF (SYSTANE ULTRA PF) 0.4-0.3 % SOLN Place 1 drop into both eyes as needed (dry eyes).     potassium chloride  (KLOR-CON ) 10 MEQ tablet Take 1 tablet (10 mEq total) by mouth daily. 90 tablet 2   Rivaroxaban  (XARELTO ) 15 MG  TABS tablet Take 1 tablet (15 mg total) by mouth daily with supper. 90 tablet 1   spironolactone  (ALDACTONE ) 25 MG tablet TAKE 1/2 TABLET BY MOUTH DAILY 45 tablet 2   umeclidinium-vilanterol (ANORO ELLIPTA ) 62.5-25 MCG/ACT AEPB INHALE ONE PUFF INTO THE LUNGS DAILY 3 each 3   Wheat Dextrin (BENEFIBER ON THE GO) POWD Take 10 mLs by mouth daily.     White Petrolatum-Mineral Oil (SYSTANE NIGHTTIME) OINT Place 1 drop into both eyes at bedtime.     Current Facility-Administered Medications  Medication Dose Route Frequency Provider Last Rate Last Admin   methylPREDNISolone  acetate (DEPO-MEDROL ) injection 80 mg  80 mg Intramuscular Once Rosa College D, MD        Allergies as of 09/09/2023 - Review Complete 09/09/2023  Allergen Reaction Noted   Bupropion hcl Other (See Comments)    Escitalopram oxalate Other (See Comments)    Fluticasone -salmeterol Other (See Comments)    Lisinopril Cough    Serevent Other (See Comments) 05/06/2011    Social History   Socioeconomic History   Marital status: Single    Spouse name: Not on file   Number of children: Not on file   Years of education: Not on file   Highest education level: Not on file  Occupational History   Occupation: Retired: Engineer, site  Tobacco Use   Smoking status: Never    Passive exposure: Past   Smokeless tobacco: Never  Vaping Use    Vaping status: Never Used  Substance and Sexual Activity   Alcohol  use: No    Alcohol /week: 0.0 standard drinks of alcohol    Drug use: No   Sexual activity: Not on file  Other Topics Concern   Not on file  Social History Narrative   Pt lives in [place] alone. She was never married.   Retired Psychologist, occupational.   Attends [employer]   Social Drivers of Corporate investment banker Strain: Not on [employer] Insecurity: Not on file  Transportation Needs: Not on file  Physical Activity: Not on file  Stress: Not on file  Social Connections: Not on file    Review of systems General: negative for malaise, night sweats, fever, chills, weight loss Neck: Negative for lumps, goiter, pain and significant neck swelling Resp: Negative for wheezing, dyspnea at rest +cough  CV: Negative for chest pain, leg swelling, palpitations, orthopnea GI: denies melena, hematochezia, nausea, vomiting, diarrhea, constipation, dysphagia, odyonophagia, early satiety or unintentional weight loss. +occasional reflux  MSK: Negative for joint pain or swelling, back pain, and muscle pain. Derm: Negative for itching or rash Psych: Denies depression, anxiety, memory loss, confusion. No homicidal or suicidal ideation.  Heme: Negative for prolonged bleeding, bruising easily, and swollen nodes. Endocrine: Negative for cold or heat intolerance, polyuria, polydipsia and goiter. Neuro: negative for tremor, gait imbalance, syncope and seizures. The remainder of the review of systems is noncontributory.  Physical Exam: BP (!) 145/48 (BP Location: Left Arm, Patient Position: Sitting, Cuff Size: Large)   Pulse (!) 55   Temp (!) 97.5 F (36.4 C) (Temporal)   Ht 5' 1.5 (1.562 m)   Wt 199 lb 1.6 oz (90.3 kg)   BMI 37.01 kg/m  General:   Alert and oriented. No distress noted. Pleasant and cooperative.  Head:  Normocephalic and atraumatic. Eyes:  Conjuctiva clear without scleral icterus. Mouth:  Oral mucosa  pink and moist. Good dentition. No lesions. Heart: irregular rhythm.  Lungs: Clear lung sounds in all lobes. Respirations equal  and unlabored. Abdomen:  +BS, soft, non-tender and non-distended. No rebound or guarding. No HSM or masses noted. Derm: No palmar erythema or jaundice Msk:  Symmetrical without gross deformities. Normal posture. Extremities:  Without edema. Neurologic:  Alert and  oriented x4 Psych:  Alert and cooperative. Normal mood and affect.  Invalid input(s): 6 MONTHS   ASSESSMENT: [name] [name] is a 83 y.o. female presenting today for follow up of anemia, GERD, atypical cough  GERD/atypical cough: Ongoing with consumption of foods over the past year or so, thought secondary possibly to GERD or combination of GERD and sinus problems as she has suffered recurrent sinusitis. Reassuringly her dysphagia has improved. She feels coughing was better until recent sinus infection. She had SLP evaluation as outlined above without significant findings. Has had some improvement with PPI BID, famotidine  PRN. Recent EGD as above. She has upcoming appt with ENT. For now will continue on PPI BID as she feels this is controlling her reflux symptoms for the most part. Can use H2B PRN. Appreciate ENT insights/recommendations as well   Anemia: previous questions of iron deficiency, started on iron by PCP though most recent labs over the past few months have shown normal iron levels, hemoglobin only slightly low. No longer on iron pills. Recent EGD with bleeding polyps, likely the culprit of her anemia. She has not had any blood in stools or black stools. Has repeat EGD scheduled for 7/9.    PLAN:  -keep scheduled EGD in July -continue protonix  40mg  BID -continue famotidine  40mg  PRN  -good reflux precautions -keep appt with ENT  All questions were answered, patient verbalized understanding and is in agreement with plan as outlined above.    Follow Up: 6 months   [name] Gironda L. Adrien Alberta,  MSN, APRN, AGNP-C Adult-Gerontology Nurse Practitioner [facility]  I have reviewed the note and agree with the APP's assessment as described in this progress note  Samantha Cress, MD Gastroenterology and Hepatology [facility]astro[facility]

## 2023-09-13 ENCOUNTER — Ambulatory Visit: Admitting: Podiatry

## 2023-09-13 ENCOUNTER — Encounter: Payer: Medicare Other | Attending: Internal Medicine | Admitting: Emergency Medicine

## 2023-09-13 VITALS — BP 142/54 | HR 65 | Temp 97.6°F | Resp 18

## 2023-09-13 DIAGNOSIS — J4520 Mild intermittent asthma, uncomplicated: Secondary | ICD-10-CM | POA: Diagnosis not present

## 2023-09-13 MED ORDER — MEPOLIZUMAB 100 MG ~~LOC~~ SOLR: 100.0000 mg | Freq: Once | SUBCUTANEOUS | Status: AC

## 2023-09-13 NOTE — Progress Notes (Signed)
 Diagnosis: Asthma  Provider:  Rosa College, MD   Procedure: Injection  Nucala  (Mepolizumab ), Dose: 100 mg, Site: subcutaneous, Number of injections: 1  Injection Site(s): Right arm  Post Care: Patient declined observation  Discharge: Condition: Good, Destination: Home . AVS Declined  Performed by:  Arlina Benjamin, RN

## 2023-09-15 ENCOUNTER — Ambulatory Visit (INDEPENDENT_AMBULATORY_CARE_PROVIDER_SITE_OTHER): Admitting: Otolaryngology

## 2023-09-15 ENCOUNTER — Encounter (INDEPENDENT_AMBULATORY_CARE_PROVIDER_SITE_OTHER): Payer: Self-pay | Admitting: Otolaryngology

## 2023-09-15 VITALS — BP 147/61 | HR 68 | Ht 61.5 in | Wt 192.0 lb

## 2023-09-15 DIAGNOSIS — R0981 Nasal congestion: Secondary | ICD-10-CM

## 2023-09-15 DIAGNOSIS — R053 Chronic cough: Secondary | ICD-10-CM

## 2023-09-15 DIAGNOSIS — R490 Dysphonia: Secondary | ICD-10-CM

## 2023-09-15 DIAGNOSIS — J343 Hypertrophy of nasal turbinates: Secondary | ICD-10-CM

## 2023-09-15 DIAGNOSIS — J328 Other chronic sinusitis: Secondary | ICD-10-CM | POA: Diagnosis not present

## 2023-09-15 DIAGNOSIS — R1319 Other dysphagia: Secondary | ICD-10-CM | POA: Diagnosis not present

## 2023-09-15 DIAGNOSIS — J342 Deviated nasal septum: Secondary | ICD-10-CM

## 2023-09-15 DIAGNOSIS — K219 Gastro-esophageal reflux disease without esophagitis: Secondary | ICD-10-CM

## 2023-09-15 MED ORDER — PREDNISONE 10 MG PO TABS: 10.0000 mg | ORAL_TABLET | Freq: Every day | ORAL | 0 refills | Status: AC

## 2023-09-15 MED ORDER — IPRATROPIUM BROMIDE 0.06 % NA SOLN: 2.0000 | Freq: Two times a day (BID) | NASAL | 12 refills | Status: DC | PRN

## 2023-09-15 NOTE — Progress Notes (Signed)
 Dear Dr. Neysa, Here is my assessment for our mutual patient, [name] [name]. Thank you for allowing me the opportunity to care for your patient. Please do not hesitate to contact me should you have any other questions. Sincerely, Dr. Eldora Blanch  Otolaryngology Clinic Note  HISTORY: [name] [name] is a 83 y.o. female kindly referred by Dr. Neysa for evaluation of chronic sinusitis.   Initial visit (08/2023): Multiple complaints today, including sinonasal and throat.  Sinuses: noted recurrent sinusitis with multiple episodes of exacerbation (keep it about all the time). More so on left. She has had a recent sinus infections over past 2 weeks - facial pressure/pain (max, eye) + discolored drainage, and was prescribed augmentin  which she finished.. No hyposmia. She feels like she is feeling some better, but continues to report some sinus pressure and ear pressure. Between episodes, she reports some nasal obstruction on left which is chronic and some chronic PND.   She is currently using flonase , and astelin  and budesonide  rinses daily. She is on Tikosyn  and as such she cannot take some antibiotics. The precipitating factor this time appears to be a dental cleaning about two weeks ago.   Antibiotics do help her symptoms. Sprays and rinses help some. Never had sinus surgery. She does report seasonal allergies (testing several years ago and had AIT), and has some spring and fall symptoms (sneezing, itching of nose).   Throat: reports coughing after eating and drinking. She denies dysphagia, and she has had swallow evaluations which have not shown worrisome aspiration and follows with GI with endoscopy and upcoming endo in July with plan to dilate UES. Does have chronic SOB. She does have GERD and takes medication for it. Does report that her voice is intermittently hoarse - comes and goes. Coughing makes it worse, singing makes it worse. Rest does improve it. Voice is good enough for her use.   Patient otherwise denies: - odynophagia, aspiration episodes, need for Heimlich, unintentional weight loss - hemoptysis - ear pain, neck masses  AP/AC: xarelto   Tobacco: no  PMHx: A-fib, A-flutter, COPD, OSA, Allergic Rhinitis, Asthma, HTN  RADIOGRAPHIC EVALUATION AND INDEPENDENT REVIEW OF OTHER RECORDS:: Dr. Luciano ENT (01/08/2022): notes - chronic sinusitis, reassuring endo, sinus culture taken(?); rec flonase  BID and rinses. CT Face 01/21/2022 independently interpreted with respect to sinuses: clear paranasal sinuses with small left septal deviation with spur. Dr. Neysa (07/05/2023): noted OSA, chronic cough, and feeling tight in chest and SOB with activitiy; noted ongoing issues despite recent endo and swallowing tests. Despites LPR symptoms as well; rec ENT eval for chronic cough Labs: CBC 01/12/2022): Eos 100 IgE 2020: 2500, prior to that 4300 Chelsea Carlan (05/11/2023) GI: GERD, coughing when eats or drinks but improved; on PPI and Famotidine ; noted improved coughing on steroids/abx, globus.  Swallow eval (02/22/2023) independently interpreted: no aspiration or penetration. Noted no significant residuals and some retrograde movement on esophageal speech. Noted CP bar Esophagram 06/16/2023 independently interpreted: CP bar noted, no obvious zenker's. Otherwise no aspiration noted. Noted mild esophageal dysmotility with some reflux of contrast.  EGD 06/08/2023:   Past Medical History:  Diagnosis Date   Allergic rhinitis    Anxiety    Asthma    Atrial flutter (HCC)    COPD (chronic obstructive pulmonary disease) (HCC)    Depression    Essential hypertension    Hyperlipidemia    Lumbar disc disease    Obesity    OSA (obstructive sleep apnea)    CPAP   Paroxysmal atrial fibrillation (  HCC)    Element of tachycardia bradycardia syndrome   Respiratory failure (HCC)    Past Surgical History:  Procedure Laterality Date   ANTERIOR FUSION LUMBAR SPINE     ATRIAL FIBRILLATION  ABLATION N/A 09/12/2020   Procedure: ATRIAL FIBRILLATION ABLATION;  Surgeon: Kelsie Agent, MD;  Location: [facility] CV [facility];  Service: Cardiovascular;  Laterality: N/A;   ATRIAL FIBRILLATION ABLATION N/A 03/18/2022   Procedure: ATRIAL FIBRILLATION ABLATION;  Surgeon: Nancey Eulas BRAVO, MD;  Location: [facility] INVAS[facility] CV LAB;  Service: Cardiovascular;  Laterality: N/A;   BIOPSY  08/27/2016   Procedure: BIOPSY;  Surgeon: Golda Claudis PENNER, MD;  Location: [facility];  Service: Endoscopy;;  FOUR GASTRIC POLYPS BIOPSIED   CARDIOVERSION N/A 03/28/2014   Procedure: CARDIOVERSION;  Surgeon: Vina Okey GAILS, MD;  Location: [facility] ORS;  Service: Cardiovascular;  Laterality: N/A;   CARDIOVERSION N/A 10/22/2016   Procedure: CARDIOVERSION;  Surgeon: Shlomo Wilbert SAUNDERS, MD;  Location: [facility] ENDOSCOPY;  Service: Cardiovascular;  Laterality: N/A;   CARDIOVERSION N/A 11/26/2020   Procedure: CARDIOVERSION;  Surgeon: Okey Vina GAILS, MD;  Location: [facility] ENDOSCOPY;  Service: Cardiovascular;  Laterality: N/A;   CATARACT EXTRACTION     COLONOSCOPY N/A 08/04/2012   Procedure: COLONOSCOPY;  Surgeon: Claudis PENNER Golda, MD;  Location: [facility];  Service: Endoscopy;  Laterality: N/A;  730-rescheduled to 1030 Ann notified pt   ELECTROPHYSIOLOGIC STUDY N/A 09/18/2014   Procedure: Atrial Fibrillation Ablation;  Surgeon: Agent Kelsie, MD;  Location: [facility] INVASIVE CV LAB;  Service: Cardiovascular;  Laterality: N/A;   ESOPHAGOGASTRODUODENOSCOPY N/A 08/27/2016   Procedure: ESOPHAGOGASTRODUODENOSCOPY (EGD);  Surgeon: Golda Claudis PENNER, MD;  Location: [facility];  Service: Endoscopy;  Laterality: N/A;  1200   ESOPHAGOGASTRODUODENOSCOPY (EGD) WITH PROPOFOL  N/A 06/08/2023   Procedure: ESOPHAGOGASTRODUODENOSCOPY (EGD) WITH PROPOFOL ;  Surgeon: Eartha Angelia Sieving, MD;  Location: [facility];  Service: Gastroenterology;  Laterality: N/A;  1:00PM;ASA 3   implantable loop recorder placement  11/22/2020   [employer]tronic Reveal Linq model LNQ 11 (SN  [id number]) implantable loop recorder   LASIK     Both eyes   TEE WITHOUT CARDIOVERSION N/A 09/18/2014   Procedure: TRANSESOPHAGEAL ECHOCARDIOGRAM (TEE);  Surgeon: Ezra GORMAN Shuck, MD;  Location: [facility] ENDOSCOPY;  Service: Cardiovascular;  Laterality: N/A;   TEE WITHOUT CARDIOVERSION N/A 10/22/2016   Procedure: TRANSESOPHAGEAL ECHOCARDIOGRAM (TEE);  Surgeon: Shlomo Wilbert SAUNDERS, MD;  Location: [facility] ENDOSCOPY;  Service: Cardiovascular;  Laterality: N/A;   TEE WITHOUT CARDIOVERSION N/A 08/24/2017   Procedure: TRANSESOPHAGEAL ECHOCARDIOGRAM (TEE);  Surgeon: Delford Maude BROCKS, MD;  Location: [facility] ENDOSCOPY;  Service: Cardiovascular;  Laterality: N/A;   TOTAL HIP ARTHROPLASTY  03/30/2008   Right   Family History  Problem Relation Age of Onset   Cancer Mother    Heart attack Father    Colon cancer Other    Social History   Tobacco Use   Smoking status: Never    Passive exposure: Past   Smokeless tobacco: Never  Substance Use Topics   Alcohol  use: No    Alcohol /week: 0.0 standard drinks of alcohol    Allergies  Allergen Reactions   Bupropion Hcl Other (See Comments)    Suicidal Thoughts.    Escitalopram Oxalate Other (See Comments)    Suicidal Thoughts.    Fluticasone -Salmeterol Other (See Comments)    Advair - Caused patient to go into Afib.    Lisinopril Cough   Serevent Other (See Comments)    Caused patient to go into Afib   Current Outpatient  Medications  Medication Sig Dispense Refill   amoxicillin -clavulanate (AUGMENTIN ) 500-125 MG tablet Take 1 tablet by mouth 2 (two) times daily.     antiseptic oral rinse (BIOTENE) LIQD 15 mLs by Mouth Rinse route daily as needed for dry mouth.     atorvastatin  (LIPITOR) 10 MG tablet Take 10 mg by mouth daily at 6 PM.     Azelastine  HCl 137 MCG/SPRAY SOLN PLACE TWO SPRAYS into BOTH nostrils TWICE DAILY. USE in each nostrils AS DIRECTED 30 mL 11   Biotin  1 MG CAPS Take 1,000 mg by mouth 3 (three) times a week.      budesonide  (PULMICORT ) 0.5 MG/2ML  nebulizer solution Take 0.5 mg by nebulization daily.     Calcium  Carbonate-Vit D-Min (QC CALCIUM -MAGNESIUM -ZINC-D3) 333.4-133 MG-UNIT TABS Take 1 tablet by mouth 2 (two) times daily.      CARTIA  XT 120 MG 24 hr capsule TAKE ONE CAPSULE BY MOUTH AT BEDTIME 90 capsule 1   cetirizine (ZYRTEC) 10 MG tablet Take 10 mg by mouth at bedtime as needed for allergies.     clindamycin (CLEOCIN) 300 MG capsule SMARTSIG:2 Capsule(s) By Mouth     diltiazem  (CARDIZEM  CD) 240 MG 24 hr capsule Take 1 capsule (240 mg total) by mouth every morning. (Patient taking differently: Take 240 mg by mouth every morning. 240 mg in am and 120 in at six pm) 90 capsule 0   diltiazem  (CARDIZEM ) 30 MG tablet TAKE 1 TABLET BY MOUTH AS NEEDED FOR PALPITATIONS 45 tablet 2   dofetilide  (TIKOSYN ) 500 MCG capsule Take 1 capsule (500 mcg total) by mouth 2 (two) times daily. 180 capsule 3   famotidine  (PEPCID ) 20 MG tablet Take 2 tablets (40 mg total) by mouth at bedtime as needed. 30 tablet 1   fluticasone  (FLONASE ) 50 MCG/ACT nasal spray Place 1 spray into both nostrils 2 (two) times daily. (Patient taking differently: Place 1 spray into both nostrils daily.)     gabapentin  (NEURONTIN ) 100 MG capsule TAKE ONE CAPSULE BY MOUTH AT BEDTIME 90 capsule 0   hydrALAZINE  (APRESOLINE ) 100 MG tablet Take 100 mg by mouth 2 (two) times daily.     HYDROcodone  bit-homatropine (HYCODAN) 5-1.5 MG/5ML syrup Take 5 mLs by mouth every 6 (six) hours as needed for cough.     Hypertonic Nasal Wash (SINUS RINSE) PACK Place 1 each into the nose daily as needed (clear nasal passage). Neilmed sinus rinse     ipratropium (ATROVENT ) 0.06 % nasal spray Place 2 sprays into both nostrils 2 (two) times daily as needed. 15 mL 12   ipratropium-albuterol  (DUONEB) 0.5-2.5 (3) MG/3ML SOLN Take 3 mLs by nebulization daily.     lactobacillus acidophilus (BACID) TABS tablet Take 1 tablet by mouth daily.     losartan  (COZAAR ) 100 MG tablet TAKE 1 TABLET BY MOUTH DAILY 90 tablet  1   Magnesium  250 MG TABS Take 250 mg by mouth daily. (Patient taking differently: Take 400 mg by mouth daily.)     montelukast  (SINGULAIR ) 10 MG tablet Take 10 mg by mouth at bedtime.     ondansetron  (ZOFRAN -ODT) 4 MG disintegrating tablet Take 4 mg by mouth as needed.     pantoprazole  (PROTONIX ) 40 MG tablet Take 40 mg by mouth 2 (two) times daily.     Polyethyl Glyc-Propyl Glyc PF (SYSTANE ULTRA PF) 0.4-0.3 % SOLN Place 1 drop into both eyes as needed (dry eyes).     potassium chloride  (KLOR-CON ) 10 MEQ tablet Take 1 tablet (10 mEq total) by  mouth daily. 90 tablet 2   Rivaroxaban  (XARELTO ) 15 MG TABS tablet Take 1 tablet (15 mg total) by mouth daily with supper. 90 tablet 1   spironolactone  (ALDACTONE ) 25 MG tablet TAKE 1/2 TABLET BY MOUTH DAILY 45 tablet 2   umeclidinium-vilanterol (ANORO ELLIPTA ) 62.5-25 MCG/ACT AEPB INHALE ONE PUFF INTO THE LUNGS DAILY 3 each 3   Wheat Dextrin (BENEFIBER ON THE GO) POWD Take 10 mLs by mouth daily.     White Petrolatum-Mineral Oil (SYSTANE NIGHTTIME) OINT Place 1 drop into both eyes at bedtime.     Current Facility-Administered Medications  Medication Dose Route Frequency Provider Last Rate Last Admin   methylPREDNISolone  acetate (DEPO-MEDROL ) injection 80 mg  80 mg Intramuscular Once Young, Clinton D, MD       BP (!) 147/61 (BP Location: Left Arm, Patient Position: Sitting, Cuff Size: Normal)   Pulse 68   Ht 5' 1.5 (1.562 m)   Wt 192 lb (87.1 kg)   SpO2 95%   BMI 35.69 kg/m   PHYSICAL EXAM:  BP (!) 147/61 (BP Location: Left Arm, Patient Position: Sitting, Cuff Size: Normal)   Pulse 68   Ht 5' 1.5 (1.562 m)   Wt 192 lb (87.1 kg)   SpO2 95%   BMI 35.69 kg/m    Salient findings:  CN II-XII intact Bilateral EAC clear and TM intact with well pneumatized middle ear spaces Weber 512: Nose: Anterior rhinoscopy reveals mild nasal septal deviation left, bilateral inferior turbinate hypertrophy.  Nasal endoscopy was indicated to better evaluate the  nose and paranasal sinuses, given the patient's history and exam findings, and is detailed below. No lesions of oral cavity/oropharynx No obviously palpable neck masses/lymphadenopathy/thyromegaly No respiratory distress or stridor; easily tolerates secretions TFL was indicated to better evaluate the proximal airway, given the patient's history and exam findings, and is detailed below.   PROCEDURE:  Prior to initiating any procedures, risks/benefits/alternatives were explained to the patient and verbal consent obtained. Diagnostic Nasal Endoscopy Pre-procedure diagnosis: Concern for chronic sinusitis Post-procedure diagnosis: same Indication: See pre-procedure diagnosis and physical exam above Complications: None apparent EBL: 0 mL Anesthesia: Lidocaine  4% and topical decongestant was topically sprayed in each nasal cavity  Description of Procedure:  Patient was identified. A rigid 30 degree endoscope was utilized to evaluate the sinonasal cavities, mucosa, sinus ostia and turbinates and septum.  Overall, signs of mucosal inflammation are not noted.  No mucopurulence, polyps, or masses noted.   Right Middle meatus: clear Right SE Recess: clear Left MM: clear Left SE Recess: clear   Photodocumentation was obtained.  CPT CODE -- 68768 - Mod 25  Procedure Note Pre-procedure diagnosis:  Dysphagia, Dysphonia, GERD Post-procedure diagnosis: Same Procedure: Transnasal Fiberoptic Laryngoscopy, CPT 31575 - Mod 25 Indication: see above Complications: None apparent EBL: 0 mL  The procedure was undertaken to further evaluate the patient's complaint above, with mirror exam inadequate for appropriate examination due to gag reflex and poor patient tolerance  Procedure:  Patient was identified as correct patient. Verbal consent was obtained. The nose was sprayed with oxymetazoline and 4% lidocaine . The The flexible laryngoscope was passed through the nose to view the nasal cavity, pharynx  (oropharynx, hypopharynx) and larynx.  The larynx was examined at rest and during multiple phonatory tasks. Documentation was obtained and reviewed with patient. The scope was removed. The patient tolerated the procedure well.  Findings: The nasal cavity and nasopharynx did not reveal any masses or lesions, mucosa appeared to be without obvious lesions. The tongue  base, pharyngeal walls, piriform sinuses, vallecula, epiglottis and postcricoid region are normal in appearance. The visualized portion of the subglottis and proximal trachea is widely patent. The vocal folds are mobile bilaterally. There are no lesions on the free edge of the vocal folds nor elsewhere in the larynx worrisome for malignancy. Mild polypoid change and age appropriate atrophy b/l     Electronically signed by: Eldora KATHEE Blanch, MD 09/26/2023 9:02 AM  Both reassuring  ASSESSMENT:  83 y.o. with:  1. Hypertrophy of both inferior nasal turbinates   2. Nasal congestion   3. Nasal septal deviation   4. Other chronic sinusitis   5. Chronic cough   6. Dysphonia   7. Esophageal dysphagia   8. Laryngopharyngeal reflux (LPR)    Noted elevated IgE with multiple frequent sinonasal exacerbation; given recent exacerbation but overall reassuring endo, we discussed management - possible NARES? IgE related pathology? She is on medical management and after R/B/A discussion, will increase her nasal sprays and rinses to see if this benefits.  From chronic cough, dysphagia, dysphonia standpoint, suspect some from VF atrophy, UACS and dysmotility. She does not have any problems swallowing solids and do not think CP bar would cause all her sx. She is scheduled for endo and dilation in July with GI. Already on GERD regimen. We discussed possible interventions including voice Rx; understandably wishes to avoid gabapentin  due to risk of polypharmacy. She declined voice Rx but will start with water  sipping strategy  - Increase flonase  and astelin   BID; add atrovent  BID for dripping - Will do short course of pred given recent sinonasal exacerbation - Consider allergy  referral given elevated IgE - Per patient preference, hold off on voice Rx - f/u 1 year if sx persist, can obtain sick CT  See below regarding exact medications prescribed this encounter including dosages and route: Meds ordered this encounter  Medications   predniSONE  (DELTASONE ) 10 MG tablet    Sig: Take 1 tablet (10 mg total) by mouth daily with breakfast for 5 days.    Dispense:  5 tablet    Refill:  0   ipratropium (ATROVENT ) 0.06 % nasal spray    Sig: Place 2 sprays into both nostrils 2 (two) times daily as needed.    Dispense:  15 mL    Refill:  12     Thank you for allowing me the opportunity to care for your patient. Please do not hesitate to contact me should you have any other questions.  Sincerely, Eldora Blanch, MD Otolaryngologist (ENT), [facility] Phone: [phone] Fax: [phone]   I have personally spent 62 minutes involved in face-to-face and non-face-to-face activities for this patient on the day of the visit.  Professional time spent excludes any procedures performed but includes the following activities, in addition to those noted in the documentation: preparing to see the patient (review of outside documentation and results), performing a medically appropriate examination, counseling, ordering medications (atrovent , pred), documenting in the electronic health record, independently interpreting results (MBS, Esophagram, CT).   09/26/2023, 9:02 AM

## 2023-09-23 ENCOUNTER — Ambulatory Visit (INDEPENDENT_AMBULATORY_CARE_PROVIDER_SITE_OTHER)

## 2023-09-23 DIAGNOSIS — Z01810 Encounter for preprocedural cardiovascular examination: Secondary | ICD-10-CM

## 2023-09-23 LAB — CUP PACEART REMOTE DEVICE CHECK
Date Time Interrogation Session: 20250625230600
Implantable Pulse Generator Implant Date: 20220826

## 2023-09-27 DIAGNOSIS — I10 Essential (primary) hypertension: Secondary | ICD-10-CM | POA: Diagnosis not present

## 2023-09-29 ENCOUNTER — Ambulatory Visit: Payer: Self-pay | Admitting: Cardiovascular Disease

## 2023-10-05 ENCOUNTER — Telehealth: Payer: Self-pay | Admitting: *Deleted

## 2023-10-05 NOTE — Telephone Encounter (Signed)
 LMOVM to return call   Pt left vm wanting a return call

## 2023-10-06 ENCOUNTER — Ambulatory Visit (HOSPITAL_COMMUNITY)
Admission: RE | Admit: 2023-10-06 | Discharge: 2023-10-06 | Disposition: A | Discharge status: Home / Self Care | Attending: Gastroenterology | Admitting: Gastroenterology

## 2023-10-06 ENCOUNTER — Ambulatory Visit (HOSPITAL_COMMUNITY): Admitting: Anesthesiology

## 2023-10-06 ENCOUNTER — Encounter (HOSPITAL_COMMUNITY)
Admission: RE | Disposition: A | Discharge status: Home / Self Care | Payer: Self-pay | Source: Home / Self Care | Attending: Gastroenterology

## 2023-10-06 ENCOUNTER — Encounter (HOSPITAL_COMMUNITY): Payer: Self-pay | Admitting: Gastroenterology

## 2023-10-06 ENCOUNTER — Other Ambulatory Visit: Payer: Self-pay

## 2023-10-06 DIAGNOSIS — I10 Essential (primary) hypertension: Secondary | ICD-10-CM | POA: Insufficient documentation

## 2023-10-06 DIAGNOSIS — K219 Gastro-esophageal reflux disease without esophagitis: Secondary | ICD-10-CM | POA: Insufficient documentation

## 2023-10-06 DIAGNOSIS — F419 Anxiety disorder, unspecified: Secondary | ICD-10-CM | POA: Insufficient documentation

## 2023-10-06 DIAGNOSIS — I2510 Atherosclerotic heart disease of native coronary artery without angina pectoris: Secondary | ICD-10-CM | POA: Insufficient documentation

## 2023-10-06 DIAGNOSIS — E785 Hyperlipidemia, unspecified: Secondary | ICD-10-CM | POA: Diagnosis not present

## 2023-10-06 DIAGNOSIS — R1310 Dysphagia, unspecified: Secondary | ICD-10-CM

## 2023-10-06 DIAGNOSIS — J4489 Other specified chronic obstructive pulmonary disease: Secondary | ICD-10-CM | POA: Insufficient documentation

## 2023-10-06 DIAGNOSIS — T182XXA Foreign body in stomach, initial encounter: Secondary | ICD-10-CM | POA: Diagnosis not present

## 2023-10-06 DIAGNOSIS — G4733 Obstructive sleep apnea (adult) (pediatric): Secondary | ICD-10-CM | POA: Insufficient documentation

## 2023-10-06 DIAGNOSIS — Z79899 Other long term (current) drug therapy: Secondary | ICD-10-CM | POA: Diagnosis not present

## 2023-10-06 DIAGNOSIS — Z8719 Personal history of other diseases of the digestive system: Secondary | ICD-10-CM

## 2023-10-06 DIAGNOSIS — D649 Anemia, unspecified: Secondary | ICD-10-CM | POA: Diagnosis not present

## 2023-10-06 DIAGNOSIS — K317 Polyp of stomach and duodenum: Secondary | ICD-10-CM | POA: Diagnosis not present

## 2023-10-06 DIAGNOSIS — I4892 Unspecified atrial flutter: Secondary | ICD-10-CM | POA: Diagnosis not present

## 2023-10-06 DIAGNOSIS — K3189 Other diseases of stomach and duodenum: Secondary | ICD-10-CM | POA: Diagnosis not present

## 2023-10-06 DIAGNOSIS — X58XXXA Exposure to other specified factors, initial encounter: Secondary | ICD-10-CM | POA: Diagnosis not present

## 2023-10-06 DIAGNOSIS — F32A Depression, unspecified: Secondary | ICD-10-CM | POA: Insufficient documentation

## 2023-10-06 DIAGNOSIS — J45901 Unspecified asthma with (acute) exacerbation: Secondary | ICD-10-CM

## 2023-10-06 HISTORY — PX: ESOPHAGOGASTRODUODENOSCOPY: SHX5428

## 2023-10-06 SURGERY — EGD (ESOPHAGOGASTRODUODENOSCOPY)
Anesthesia: General

## 2023-10-06 MED ORDER — IPRATROPIUM-ALBUTEROL 0.5-2.5 (3) MG/3ML IN SOLN
RESPIRATORY_TRACT | Status: AC
  Filled 2023-10-06: qty 3

## 2023-10-06 MED ORDER — LIDOCAINE 2% (20 MG/ML) 5 ML SYRINGE
INTRAMUSCULAR | Status: DC | PRN
  Administered 2023-10-06: 100 mg via INTRAVENOUS

## 2023-10-06 MED ORDER — PROPOFOL 500 MG/50ML IV EMUL
INTRAVENOUS | Status: DC | PRN
  Administered 2023-10-06: 150 ug/kg/min via INTRAVENOUS

## 2023-10-06 MED ORDER — LACTATED RINGERS IV SOLN: INTRAVENOUS | Status: DC

## 2023-10-06 MED ORDER — IPRATROPIUM-ALBUTEROL 0.5-2.5 (3) MG/3ML IN SOLN
RESPIRATORY_TRACT | Status: AC
  Administered 2023-10-06: 3 mL
  Filled 2023-10-06: qty 3

## 2023-10-06 NOTE — Op Note (Signed)
 [facility] Patient Name: [name] [name] Procedure Date: 10/06/2023 8:35 AM MRN: [id number] Date of Birth: [birth year] Attending MD: Toribio Fortune , , [id number] CSN: [id number] Age: 83 Admit Type: Outpatient Procedure:                Upper GI endoscopy Indications:              Dysphagia, For therapy of gastric polyps Providers:                Toribio Fortune, Rosina Sprague, Harlene Lips                            Alm Dorcas Balm., Technician Referring MD:              Medicines:                Monitored Anesthesia Care Complications:            No immediate complications. Estimated Blood Loss:     Estimated blood loss: none. Procedure:                Pre-Anesthesia Assessment:                           - Prior to the procedure, a History and Physical                            was performed, and patient medications, allergies                            and sensitivities were reviewed. The patient's                            tolerance of previous anesthesia was reviewed.                           - The risks and benefits of the procedure and the                            sedation options and risks were discussed with the                            patient. All questions were answered and informed                            consent was obtained.                           - ASA Grade Assessment: III - A patient with severe                            systemic disease.                           After obtaining informed consent, the endoscope was                            passed under direct vision.  Throughout the                            procedure, the patient's blood pressure, pulse, and                            oxygen  saturations were monitored continuously. The                            GIF-H190 ([id number]) scope was introduced through the                            mouth, and advanced to the second part of duodenum.                            The upper GI  endoscopy was accomplished without                            difficulty. The patient tolerated the procedure                            well. Scope In: 9:39:49 AM Scope Out: 10:02:20 AM Total Procedure Duration: 0 hours 22 minutes 31 seconds  Findings:      No endoscopic abnormality was evident in the esophagus to explain the       patient's complaint of dysphagia. It was decided, however, to proceed       with dilation of the entire esophagus. A guidewire was placed and the       scope was withdrawn. Dilation was performed with a Savary dilator with       no resistance at 18 mm. The dilation site was examined following       endoscope reinsertion and showed no change.      Three endoclips were found in the gastric body. These were placed in her       previous endoscopy.      Multiple 4 to 10 mm sessile polyps with no bleeding and stigmata of       recent bleeding were found in the gastric body. These polyps were       removed with a cold snare. Resection and retrieval were complete. To       prevent bleeding after the polypectomy, two hemostatic clips were       successfully placed (MR safe). Clip manufacturer: [employer].       There was no bleeding at the end of the procedure.      The examined duodenum was normal. Impression:               - No endoscopic esophageal abnormality to explain                            patient's dysphagia. Esophagus dilated.                           - Threeendoclips were found in the stomach.                           - Multiple gastric polyps. Resected  and retrieved.                            Clip manufacturer: [employer]. Clips (MR                            safe) were placed.                           - Normal examined duodenum. Moderate Sedation:      Per Anesthesia Care Recommendation:           - Discharge patient to home (ambulatory).                           - Resume previous diet.                           - Await pathology  results.                           - Repeat upper endoscopy in 6 months for                            surveillance.                           - Continue present medications.                           - Restart Xarelto  on 12/09/2023. Procedure Code(s):        --- Professional ---                           [phone], Esophagogastroduodenoscopy, flexible,                            transoral; with removal of tumor(s), polyp(s), or                            other lesion(s) by snare technique                           43248, Esophagogastroduodenoscopy, flexible,                            transoral; with insertion of guide wire followed by                            passage of dilator(s) through esophagus over guide                            wire Diagnosis Code(s):        --- Professional ---                           R13.10, Dysphagia, unspecified  T18.2XXA, Foreign body in stomach, initial encounter                           K31.7, Polyp of stomach and duodenum CPT copyright 2022 [employer]. All rights reserved. The codes documented in this report are preliminary and upon coder review may  be revised to meet current compliance requirements. Toribio Fortune, MD Toribio Fortune,  10/06/2023 10:12:46 AM This report has been signed electronically. Number of Addenda: 0

## 2023-10-06 NOTE — Transfer of Care (Signed)
 Immediate Anesthesia Transfer of Care Note  Patient: [name] [name]  Procedure(s) Performed: EGD (ESOPHAGOGASTRODUODENOSCOPY)  Patient Location: Endoscopy Unit  Anesthesia Type:General  Level of Consciousness: awake, alert , oriented, and patient cooperative  Airway & Oxygen  Therapy: Patient Spontanous Breathing  Post-op Assessment: Report given to RN, Post -op Vital signs reviewed and stable, and Patient moving all extremities X 4  Post vital signs: Reviewed and stable  Last Vitals:  Vitals Value Taken Time  BP 116/57 10/06/23 10:07  Temp 36.4 C 10/06/23 10:07  Pulse 55 10/06/23 10:07  Resp 23 10/06/23 10:07  SpO2 94 % 10/06/23 10:07    Last Pain:  Vitals:   10/06/23 1007  TempSrc: Oral  PainSc: 0-No pain         Complications: No notable events documented.

## 2023-10-06 NOTE — Progress Notes (Signed)
 Dropped first dose of duoneb attempting to administer to patient. Retrieved second dose

## 2023-10-06 NOTE — Anesthesia Preprocedure Evaluation (Signed)
 Anesthesia Evaluation  Patient identified by MRN, date of birth, ID band Patient awake    Reviewed: Allergy  & Precautions, H&P , NPO status , Patient's Chart, lab work & pertinent test results, reviewed documented beta blocker date and time   Airway Mallampati: II  TM Distance: >3 FB Neck ROM: full    Dental no notable dental hx.    Pulmonary neg pulmonary ROS, shortness of breath, asthma , sleep apnea , COPD   Pulmonary exam normal breath sounds clear to auscultation       Cardiovascular Exercise Tolerance: Good hypertension, + CAD  negative cardio ROS  Rhythm:regular Rate:Normal     Neuro/Psych  PSYCHIATRIC DISORDERS Anxiety Depression    negative neurological ROS  negative psych ROS   GI/Hepatic negative GI ROS, Neg liver ROS,GERD  ,,  Endo/Other  negative endocrine ROS    Renal/GU negative Renal ROS  negative genitourinary   Musculoskeletal   Abdominal   Peds  Hematology negative hematology ROS (+) Blood dyscrasia, anemia   Anesthesia Other Findings   Reproductive/Obstetrics negative OB ROS                              Anesthesia Physical Anesthesia Plan  ASA: 3  Anesthesia Plan: General   Post-op Pain Management:    Induction:   PONV Risk Score and Plan: Propofol  infusion  Airway Management Planned:   Additional Equipment:   Intra-op Plan:   Post-operative Plan:   Informed Consent: I have reviewed the patients History and Physical, chart, labs and discussed the procedure including the risks, benefits and alternatives for the proposed anesthesia with the patient or authorized representative who has indicated his/her understanding and acceptance.     Dental Advisory Given  Plan Discussed with: CRNA  Anesthesia Plan Comments:         Anesthesia Quick Evaluation

## 2023-10-06 NOTE — Discharge Instructions (Addendum)
 You are being discharged to home.  Resume your previous diet.  We are waiting for your pathology results.  Your physician has recommended a repeat upper endoscopy in six months for surveillance.  Continue your present medications.  Restart Xarelto  on 10/08/2023.

## 2023-10-06 NOTE — Interval H&P Note (Signed)
 History and Physical Interval Note:  10/06/2023 7:35 AM  [name] [name]  has presented today for surgery, with the diagnosis of hx gastric polyp.  The various methods of treatment have been discussed with the patient and family. After consideration of risks, benefits and other options for treatment, the patient has consented to  Procedure(s) with comments: EGD (ESOPHAGOGASTRODUODENOSCOPY) (N/A) - 845am, asa 1-2 as a surgical intervention.  The patient's history has been reviewed, patient examined, no change in status, stable for surgery.  I have reviewed the patient's chart and labs.  Questions were answered to the patient's satisfaction.     Grafton Warzecha Castaneda Mayorga

## 2023-10-07 ENCOUNTER — Telehealth (INDEPENDENT_AMBULATORY_CARE_PROVIDER_SITE_OTHER): Payer: Self-pay

## 2023-10-07 NOTE — Telephone Encounter (Signed)
 Patient called today saying she had a cough after Egd yesterday, and wanted to know if this came from having the EGD. She says she is not having it today. I did tell her if this reoccurs she can try some Tylenol  or Cepacol lozenges to help, but the Egd could have caused some throat irritation and caused the cough. Patient states understanding.

## 2023-10-07 NOTE — Telephone Encounter (Signed)
 Agree, the fact that the cough has resolved is reassuring.

## 2023-10-08 ENCOUNTER — Ambulatory Visit (INDEPENDENT_AMBULATORY_CARE_PROVIDER_SITE_OTHER): Payer: Self-pay | Admitting: Gastroenterology

## 2023-10-08 ENCOUNTER — Encounter (HOSPITAL_COMMUNITY): Payer: Self-pay | Admitting: Gastroenterology

## 2023-10-08 LAB — SURGICAL PATHOLOGY

## 2023-10-08 NOTE — Progress Notes (Signed)
 [facility] Summary Report / Loop Recorder

## 2023-10-08 NOTE — Anesthesia Postprocedure Evaluation (Signed)
 Anesthesia Post Note  Patient: [name] [name]  Procedure(s) Performed: EGD (ESOPHAGOGASTRODUODENOSCOPY)  Patient location during evaluation: Phase II Anesthesia Type: General Level of consciousness: awake Pain management: pain level controlled Vital Signs Assessment: post-procedure vital signs reviewed and stable Respiratory status: spontaneous breathing and respiratory function stable Cardiovascular status: blood pressure returned to baseline and stable Postop Assessment: no headache and no apparent nausea or vomiting Anesthetic complications: no Comments: Late entry   No notable events documented.   Last Vitals:  Vitals:   10/06/23 1007 10/06/23 1017  BP: (!) 116/57   Pulse: (!) 55   Resp: (!) 23   Temp: 36.4 C   SpO2: 94% 97%    Last Pain:  Vitals:   10/06/23 1007  TempSrc: Oral  PainSc: 0-No pain                 Yvonna JINNY Bosworth

## 2023-10-09 NOTE — Progress Notes (Signed)
 HPI female never smoker followed for OSA, allergic rhinitis, asthma, Hyper IgE,  complicated by A. Fib/Coumadin , HBP, GERD, obesity NPSG prior to EMR in 2012 Office Spirometry 09/02/16-WNL. FVC 2.06/76%, FEV1 1.57/77%, ratio 0.76, FEF 25-75% 1.26/80% Allergy  labs 09/02/16- eosinophils 200, total IgE 4230 causing elevation of all specific allergen antibody levels including Aspergillus. Nucala   + 2018 PFT-10/13/16-minimal obstruction without response to dilator, minimal reduction of diffusion. FVC 2.02/77%, FEV1 1.62/83%, ratio 0.80, TLC 92%, DLCO 77% CT soft tissue neck 09/22/16-1. 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  Subsegmental atelectasis and nonspecific linear patchy densities in the lungs as described. They have a benign appearance.   Small hyperdense masses in the mediastinum are stable compared with 2015 supporting benign etiology such as ectopic thyroid  tissue.  Three-vessel prominent coronary artery calcification.  Bibasilar bronchiolectasis. Labs 09/20/2017-WBC 14,900, hemoglobin 11.2, eosinophils absent, lymphocytes low( ?steroids or viral?), BNP 220, CMET wnl IgE 04/12/2018- 2,574, 10/16/16- 4,302, 11/09/13- 3,513 Office Spirometry 04/12/2018-mild restriction of exhaled volume.  Possible mild obstructive airways.  FVC 1.9/73%, FEV1 1.4/75%, ratio 1.76, FEF 25-75% 1.2/80%  -------------------------------------------------------------------------------------------     07/05/23- 83 year old female never smoker followed for OSA, allergic Rhinitis, Asthma/ Nucala , Hyper IgE, complicated by PAFibXarelto/ Pacemaker, HBP, GERD, obesity, Anemia,  -Neb Pulmicort /  Duoneb   Anoro, Flonase , Zyrtec, Singulair , Nucala ,  CPAP 5-15/Adapt      AirSense 11 AutoSet Download- compliance 100%, AHI 2.2/hr Body weight today- 199 lbs Discussed the use of AI scribe software for clinical note transcription with the patient, who gave verbal consent  to proceed. History of Present Illness   The patient, with a history of sleep apnea, presents with ongoing issues with her CPAP mask. She reports discomfort and difficulty achieving a proper fit, leading to leaks and skin irritation on the nose. She has previously experienced TMJ and soreness on bridge of nose  from tightening the mask too much and wishes to avoid a recurrence. She has an upcoming appointment with the equipment provider to address these issues.  In addition, the patient reports feeling 'tight' in the chest and shortness of breath, particularly after exertion such as 'getting out and about.' She is currently undergoing physical therapy for issues with her feet, which have been causing difficulty walking and are visibly swollen. She also reports frequent nocturia, approximately every hour at night, which she attributes to her heart medications.  The patient also mentions ongoing issues with coughing and swallowing, despite recent endoscopy and swallowing tests which did not identify a cause. She has had polyps removed from her stomach, some of which were bleeding, and is due for a follow-up procedure in three months to stretch her esophagus. She expresses interest in seeing an ear, nose, and throat specialist to further investigate these symptoms.    Describes LPR symptoms, L ear stopped up. Agrees to ENT referral.   Assessment and Plan:    Obstructive Sleep Apnea CPAP effectively controls apneas but mask fit issues cause discomfort and air leakage. - Order mask refitting. - Ensure face-to-face appointment for mask refitting. - Discuss different mask styles with equipment provider.  Chronic Cough Chronic cough possibly related to esophageal issues, requires ENT evaluation considering GI findings. - Refer to ENT specialist for evaluation of chronic cough and related symptoms. - Instruct her to inform ENT about GI findings and previous tests.  Gastrointestinal Polyps Endoscopy  revealed stomach polyps with bleeding; follow-up endoscopy planned for further evaluation and esophageal stretching. - GI to Schedule  follow-up endoscopy in three months for esophageal stretching and polyp evaluation.  Foot Pain and Swelling Foot pain and swelling affect mobility; physical therapy and chiropractic care ongoing. - Continue physical therapy for foot pain and swelling. - Attend chiropractic appointment for further evaluation. - Consider using higher elastic socks for swelling.  Frequent Nocturia Frequent nocturia likely related to heart medications for fluid retention management. - Discuss nocturia with primary care physician/ Cardiology..   10/12/23- 83 year old female never smoker followed for OSA, allergic Rhinitis, Asthma/ Nucala , Hyper IgE, complicated by PAFibXarelto/ Pacemaker, HBP, GERD, obesity, Anemia,  -Neb Pulmicort /  Duoneb   Anoro, Flonase , Zyrtec, Singulair , Nucala ,  CPAP 5-15/Adapt      AirSense 11 AutoSet Download- compliance 100%, AHI 2.1/hr Body weight today- 199 lbs -----OSA, Some SOB more than usual, Ringing in ears and they feel swollen   Discussed the use of AI scribe software for clinical note transcription with the patient, who gave verbal consent to proceed.  History of Present Illness   [name] [name] is an 83 year old female with asthma and obstructive sleep apnea who presents with shortness of breath.  She experiences shortness of breath, which she partly attributes to the weather. She uses a CPAP machine for obstructive sleep apnea. Her asthma management includes Nucala  injections, a nebulizer machine, Anoro inhaler, and Singulair  pill. She finds Nucala  helpful. She did not use her nebulizer treatment on the morning of her recent endoscopy, resulting in shortness of breath. She has stopped physical therapy due to fluctuations in her condition and Medicare coverage issues.   Doing very well with CPAP as reviewed- definitely sleeps better.      Assessment and Plan:    Asthma moderate persistent Asthma management includes nebulizer, Anoro inhaler, Singulair , and Nucala  injections. Nucala  is beneficial. Increased shortness of breath may be related to weather changes or lack of exercise. - Continue nebulizer, Anoro inhaler, Singulair , and Nucala  injections. - Encourage indoor walking in air-conditioned environments to maintain stamina.  Obstructive Sleep Apnea Obstructive sleep apnea is well-managed with CPAP therapy. CPAP download shows good adherence and effectiveness.  Atrial Fibrillation Atrial fibrillation is well-controlled with episodes occurring 12.5% of the time. She is concerned about potential cardiac issues contributing to shortness of breath. Currently in sinus rhythm. - She plans follow-up with cardiologist to evaluate for other potential cardiac issues contributing to shortness of breath.  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 - Monitor symptoms and consult ENT if symptoms worsen or become more intrusive.  Dry Eye Syndrome Reports dry eye symptoms, particularly in the left eye, with a sensation of a film over it. - Consult with eye doctor in [place] for evaluation and management of dry eye symptoms.     ROS-see HPI   + = positive Constitutional:   No-   weight loss, night sweats, fevers, chills, fatigue, lassitude. HEENT:   No-  headaches, difficulty swallowing, tooth/dental problems, sore throat,       No-  sneezing, itching,  ear ache,  +nasal congestion, post nasal drip,  CV:  No-   chest pain, orthopnea, PND, +swelling in lower extremities, anasarca, dizziness, palpitations Resp: +  shortness of breath with exertion or at rest.                +productive cough,  + non-productive cough,  No- coughing up of blood.              No-change in color of mucus. + wheezing.  Skin: No-   rash or lesions. GI:  No-   heartburn, indigestion, abdominal pain, nausea,  vomiting, GU:  MS:  No-   joint pain or swelling. . Neuro-     nothing unusual Psych:  No- change in mood or affect. No depression or anxiety.  No memory loss.  OBJ- Physical Exam General- Alert, Oriented, Affect-appropriate, Distress- none acute, + obese Skin- rash-none, lesions- none, excoriation- none Lymphadenopathy- none Head- atraumatic            Eyes- Gross vision intact, PERRLA, conjunctivae and secretions clear            Ears- Hearing, canals-normal            Nose- No turbinate edema, no-Septal dev, mucus, polyps, erosion, perforation             Throat- Mallampati III-IV , mucosa- not red,  drainage- none, tonsils- atrophic Neck- flexible , trachea midline, no stridor , thyroid  nl, carotid no bruit Chest - symmetrical excursion , unlabored           Heart/CV- RR today/ hx AFib(no pacemaker) , no murmur , no gallop, no rub, nl  s1 s2                  - JVD- none , edema- none, stasis changes- none, varices- none           Lung- + trace crackle L base, Wheeze-none , cough-none,                         dullness-none, rub- none           Chest wall-  Abd- Br/ Gen/ Rectal- Not done, not indicated Extrem- cyanosis- none, clubbing, none, atrophy- none, strength- nl, + cane Neuro- grossly intact to observation       Neysa Reggy BIRCH, MD

## 2023-10-11 ENCOUNTER — Ambulatory Visit: Admitting: Internal Medicine

## 2023-10-11 ENCOUNTER — Encounter: Payer: Medicare Other | Attending: Internal Medicine | Admitting: *Deleted

## 2023-10-11 VITALS — BP 147/51 | HR 70 | Temp 97.6°F | Resp 18

## 2023-10-11 DIAGNOSIS — J4520 Mild intermittent asthma, uncomplicated: Secondary | ICD-10-CM | POA: Diagnosis not present

## 2023-10-11 MED ORDER — MEPOLIZUMAB 100 MG ~~LOC~~ SOLR
100.0000 mg | Freq: Once | SUBCUTANEOUS | Status: AC
Start: 2023-10-11 — End: 2023-10-11
  Administered 2023-10-11: 100 mg via SUBCUTANEOUS

## 2023-10-11 NOTE — Progress Notes (Signed)
 Diagnosis: Asthma  Provider:   Jetty Duhamel, MD  Procedure: Injection  Nucala (Mepolizumab), Dose: 100 mg, Site: subcutaneous, Number of injections: 1  Injection Site(s): Left arm  Post Care: Observation period completed  Discharge: Condition: Good, Destination: Home . AVS Provided  Performed by:  Daleen Squibb, RN

## 2023-10-12 ENCOUNTER — Ambulatory Visit: Admitting: Internal Medicine

## 2023-10-12 ENCOUNTER — Encounter: Payer: Self-pay | Admitting: Internal Medicine

## 2023-10-12 VITALS — BP 140/65 | HR 56 | Temp 97.6°F | Resp 18 | Ht 61.5 in | Wt 199.0 lb

## 2023-10-12 DIAGNOSIS — G4733 Obstructive sleep apnea (adult) (pediatric): Secondary | ICD-10-CM | POA: Diagnosis not present

## 2023-10-12 NOTE — Progress Notes (Signed)
 6 mth EGD noted in recall\ Patient result letter mailed procedure note and pathology result faxed to PCP

## 2023-10-12 NOTE — Patient Instructions (Signed)
 We can continue CPAP auto 5-15.  We can continue current breathing meds.

## 2023-10-18 ENCOUNTER — Other Ambulatory Visit: Payer: Self-pay | Admitting: *Deleted

## 2023-10-18 DIAGNOSIS — I480 Paroxysmal atrial fibrillation: Secondary | ICD-10-CM

## 2023-10-18 DIAGNOSIS — I4892 Unspecified atrial flutter: Secondary | ICD-10-CM

## 2023-10-18 MED ORDER — RIVAROXABAN 15 MG PO TABS: 15.0000 mg | ORAL_TABLET | Freq: Every day | ORAL | 1 refills | Status: DC

## 2023-10-18 NOTE — Telephone Encounter (Addendum)
 Xarelto  15mg  refill request received. Pt is 83 years old, weight-90.3kg, Crea-1.31 on 08/10/23 via [employer], last seen by [employer] on 09/03/23 via Tele Visit and Dr. Nancey on 07/02/23, Diagnosis-Afib/flutter, CrCl-46.39 mL/min; Dose is appropriate based on dosing criteria. Will send in refill to requested pharmacy.

## 2023-10-25 ENCOUNTER — Ambulatory Visit (INDEPENDENT_AMBULATORY_CARE_PROVIDER_SITE_OTHER)

## 2023-10-25 DIAGNOSIS — I480 Paroxysmal atrial fibrillation: Secondary | ICD-10-CM

## 2023-10-25 LAB — CUP PACEART REMOTE DEVICE CHECK
Date Time Interrogation Session: 20250727230602
Implantable Pulse Generator Implant Date: 20220826

## 2023-10-28 DIAGNOSIS — I10 Essential (primary) hypertension: Secondary | ICD-10-CM | POA: Diagnosis not present

## 2023-10-29 ENCOUNTER — Encounter: Payer: Self-pay | Admitting: Internal Medicine

## 2023-11-08 ENCOUNTER — Ambulatory Visit: Payer: Self-pay | Admitting: Cardiovascular Disease

## 2023-11-08 ENCOUNTER — Encounter: Payer: Medicare Other | Attending: Internal Medicine | Admitting: Internal Medicine

## 2023-11-08 ENCOUNTER — Encounter: Payer: Self-pay | Admitting: Cardiovascular Disease

## 2023-11-08 VITALS — BP 137/52 | HR 61 | Temp 97.6°F | Resp 18

## 2023-11-08 DIAGNOSIS — J4520 Mild intermittent asthma, uncomplicated: Secondary | ICD-10-CM | POA: Insufficient documentation

## 2023-11-08 MED ORDER — MEPOLIZUMAB 100 MG ~~LOC~~ SOLR
100.0000 mg | Freq: Once | SUBCUTANEOUS | Status: AC
  Administered 2023-11-08 (×2): 100 mg via SUBCUTANEOUS

## 2023-11-08 NOTE — Progress Notes (Signed)
 Diagnosis: Asthma  Provider:  Maree Isles MD  Procedure: Injection  Nucala  (Mepolizumab ), Dose: 100 mg, Site: subcutaneous, Number of injections: 1  Injection Site(s): Left arm  Post Care: Observation period completed  Discharge: Condition: Good, Destination: Home . AVS Provided  Performed by:  Blanca Selinda SAUNDERS, LPN

## 2023-11-09 ENCOUNTER — Ambulatory Visit (INDEPENDENT_AMBULATORY_CARE_PROVIDER_SITE_OTHER): Payer: Medicare Other | Admitting: Podiatry

## 2023-11-09 ENCOUNTER — Encounter: Payer: Self-pay | Admitting: Podiatry

## 2023-11-09 DIAGNOSIS — B351 Tinea unguium: Secondary | ICD-10-CM | POA: Diagnosis not present

## 2023-11-09 DIAGNOSIS — M79674 Pain in right toe(s): Secondary | ICD-10-CM

## 2023-11-09 DIAGNOSIS — M79675 Pain in left toe(s): Secondary | ICD-10-CM

## 2023-11-11 DIAGNOSIS — E119 Type 2 diabetes mellitus without complications: Secondary | ICD-10-CM | POA: Diagnosis not present

## 2023-11-11 DIAGNOSIS — Z6836 Body mass index (BMI) 36.0-36.9, adult: Secondary | ICD-10-CM | POA: Diagnosis not present

## 2023-11-11 DIAGNOSIS — I10 Essential (primary) hypertension: Secondary | ICD-10-CM | POA: Diagnosis not present

## 2023-11-11 DIAGNOSIS — R52 Pain, unspecified: Secondary | ICD-10-CM | POA: Diagnosis not present

## 2023-11-11 DIAGNOSIS — R002 Palpitations: Secondary | ICD-10-CM | POA: Diagnosis not present

## 2023-11-11 DIAGNOSIS — Z299 Encounter for prophylactic measures, unspecified: Secondary | ICD-10-CM | POA: Diagnosis not present

## 2023-11-11 DIAGNOSIS — I4891 Unspecified atrial fibrillation: Secondary | ICD-10-CM | POA: Diagnosis not present

## 2023-11-12 DIAGNOSIS — M25571 Pain in right ankle and joints of right foot: Secondary | ICD-10-CM | POA: Diagnosis not present

## 2023-11-12 DIAGNOSIS — M62830 Muscle spasm of back: Secondary | ICD-10-CM | POA: Diagnosis not present

## 2023-11-12 DIAGNOSIS — M25572 Pain in left ankle and joints of left foot: Secondary | ICD-10-CM | POA: Diagnosis not present

## 2023-11-12 DIAGNOSIS — M9905 Segmental and somatic dysfunction of pelvic region: Secondary | ICD-10-CM | POA: Diagnosis not present

## 2023-11-12 DIAGNOSIS — M9903 Segmental and somatic dysfunction of lumbar region: Secondary | ICD-10-CM | POA: Diagnosis not present

## 2023-11-12 DIAGNOSIS — M9902 Segmental and somatic dysfunction of thoracic region: Secondary | ICD-10-CM | POA: Diagnosis not present

## 2023-11-12 DIAGNOSIS — M546 Pain in thoracic spine: Secondary | ICD-10-CM | POA: Diagnosis not present

## 2023-11-14 NOTE — Progress Notes (Signed)
  Subjective:  Patient ID: [name] [name], female    DOB: May 10, 1940,  MRN: [id number]  [name] [name] presents to clinic today for painful thick toenails that are difficult to trim. Pain interferes with ambulation. Aggravating factors include wearing enclosed shoe gear. Pain is relieved with periodic professional debridement.  Chief Complaint  Patient presents with   RFC    Rm17 Routine foot care Dr. Maree last visit June 2025   New problem(s): None.   PCP is Maree Isles, MD.  Allergies  Allergen Reactions   Bupropion Hcl Other (See Comments)    Suicidal Thoughts.    Escitalopram Oxalate Other (See Comments)    Suicidal Thoughts.    Fluticasone -Salmeterol Other (See Comments)    Advair - Caused patient to go into Afib.    Lisinopril Cough   Serevent Other (See Comments)    Caused patient to go into Afib    Review of Systems: Negative except as noted in the HPI.  Objective: No changes noted in today's physical examination. There were no vitals filed for this visit. JUNKO OHAGAN is a pleasant 83 y.o. female in NAD. AAO x 3.  Vascular Examination: Capillary refill time immediate b/l. Vascular status intact b/l with palpable pedal pulses. Pedal hair present b/l. No pain with calf compression b/l. Skin temperature gradient WNL b/l. No cyanosis or clubbing b/l. No ischemia or gangrene noted b/l.   Neurological Examination: Sensation grossly intact b/l with 10 gram monofilament. Vibratory sensation intact b/l. Pt has subjective symptoms of neuropathy.  Dermatological Examination: Pedal skin with normal turgor, texture and tone b/l.  No open wounds. No interdigital macerations.   Toenails 1-5 b/l thick, discolored, elongated with subungual debris and pain on dorsal palpation.   Hyperkeratotic lesion(s) medial PIPJ of right second digit.  No erythema, no edema, no drainage, no fluctuance.  Musculoskeletal Examination: Muscle strength 5/5 to all lower extremity  muscle groups bilaterally. Limited joint ROM to the 1st MPJ LLE. Hammertoe(s) 2-5 b/l. Pes planus deformity noted bilateral LE.  Radiographs: None  Assessment/Plan: 1. Pain due to onychomycosis of toenails of both feet    -Consent given for treatment as described below: -Examined patient. -Continue wide shoe with high toe box to accommodate digital deformitites. -Mycotic toenails 1-5 bilaterally were debrided in length and girth with sterile nail nippers and dremel without incident. -As a courtesy, corn(s) right second digit pared utilizing sterile scalpel blade without complication or incident. Total number pared=1. -Patient/POA to call should there be question/concern in the interim.   Return in about 9 weeks (around 01/11/2024).  Delon LITTIE Merlin, DPM      [place] LOCATION: [address], [place] [address]                   Office [phone]   [facility] LOCATION: [address] [place], [place] [address] Office [phone]

## 2023-11-23 DIAGNOSIS — H16201 Unspecified keratoconjunctivitis, right eye: Secondary | ICD-10-CM | POA: Diagnosis not present

## 2023-11-24 ENCOUNTER — Other Ambulatory Visit (HOSPITAL_COMMUNITY): Payer: Self-pay | Admitting: Cardiology

## 2023-11-25 ENCOUNTER — Ambulatory Visit

## 2023-11-25 DIAGNOSIS — R001 Bradycardia, unspecified: Secondary | ICD-10-CM | POA: Diagnosis not present

## 2023-11-25 LAB — CUP PACEART REMOTE DEVICE CHECK
Date Time Interrogation Session: 20250827231002
Implantable Pulse Generator Implant Date: 20220826

## 2023-11-27 DIAGNOSIS — I10 Essential (primary) hypertension: Secondary | ICD-10-CM | POA: Diagnosis not present

## 2023-12-02 ENCOUNTER — Ambulatory Visit: Payer: Self-pay | Admitting: Cardiovascular Disease

## 2023-12-06 ENCOUNTER — Encounter: Attending: Internal Medicine | Admitting: *Deleted

## 2023-12-06 VITALS — BP 141/55 | HR 60 | Temp 97.5°F | Resp 18

## 2023-12-06 DIAGNOSIS — J4520 Mild intermittent asthma, uncomplicated: Secondary | ICD-10-CM | POA: Diagnosis present

## 2023-12-06 MED ORDER — MEPOLIZUMAB 100 MG ~~LOC~~ SOLR
100.0000 mg | Freq: Once | SUBCUTANEOUS | Status: AC
  Administered 2023-12-06: 100 mg via SUBCUTANEOUS

## 2023-12-06 NOTE — Progress Notes (Signed)
 Diagnosis: Asthma  Provider:   Jetty Duhamel, MD  Procedure: Injection  Nucala (Mepolizumab), Dose: 100 mg, Site: subcutaneous, Number of injections: 1  Injection Site(s): Right arm  Post Care: Observation period completed  Discharge: Condition: Good, Destination: Home . AVS Provided  Performed by:  Daleen Squibb, RN

## 2023-12-06 NOTE — Progress Notes (Signed)
 Remote Loop Recorder Transmission

## 2023-12-07 ENCOUNTER — Telehealth: Payer: Self-pay

## 2023-12-07 NOTE — Telephone Encounter (Signed)
 Unscheduled remote transmission: ILR Normal device function.  RRT 12/06/23 per website   _____________________________________________________________________  Beatris w/ patient regarding ILR reaching RRT on 12/06/2023. ILR placed for AF management. Discussed different options and patient requests to replace ILR & continue monitoring.   Will forward to provider for awareness and further recommendations moving forward. Patient has F/U appointment w/ Dr. Nancey on 12/31/2023.   Informed to call device clinic with any questions or concerns. Patient appreciative of call.

## 2023-12-09 ENCOUNTER — Other Ambulatory Visit: Payer: Self-pay | Admitting: Podiatry

## 2023-12-09 NOTE — Progress Notes (Signed)
 Remote Loop Recorder Transmission

## 2023-12-15 ENCOUNTER — Other Ambulatory Visit: Payer: Self-pay | Admitting: Cardiology

## 2023-12-16 NOTE — Telephone Encounter (Signed)
 Spoke w/ patient - she is scheduled for loop explant and re-implant with Dr. Nancey on 10/22 at 12pm.

## 2023-12-21 NOTE — Progress Notes (Signed)
 [facility] Summary Report / Loop Recorder

## 2023-12-23 DIAGNOSIS — Z23 Encounter for immunization: Secondary | ICD-10-CM | POA: Diagnosis not present

## 2023-12-23 DIAGNOSIS — I10 Essential (primary) hypertension: Secondary | ICD-10-CM | POA: Diagnosis not present

## 2023-12-23 DIAGNOSIS — J449 Chronic obstructive pulmonary disease, unspecified: Secondary | ICD-10-CM | POA: Diagnosis not present

## 2023-12-23 DIAGNOSIS — Z299 Encounter for prophylactic measures, unspecified: Secondary | ICD-10-CM | POA: Diagnosis not present

## 2023-12-23 DIAGNOSIS — I4891 Unspecified atrial fibrillation: Secondary | ICD-10-CM | POA: Diagnosis not present

## 2023-12-23 DIAGNOSIS — R5383 Other fatigue: Secondary | ICD-10-CM | POA: Diagnosis not present

## 2023-12-26 ENCOUNTER — Other Ambulatory Visit: Payer: Self-pay | Admitting: Cardiology

## 2023-12-27 ENCOUNTER — Ambulatory Visit (INDEPENDENT_AMBULATORY_CARE_PROVIDER_SITE_OTHER)

## 2023-12-27 DIAGNOSIS — I480 Paroxysmal atrial fibrillation: Secondary | ICD-10-CM

## 2023-12-27 LAB — CUP PACEART REMOTE DEVICE CHECK
Date Time Interrogation Session: 20250928230459
Implantable Pulse Generator Implant Date: 20220826

## 2023-12-28 DIAGNOSIS — I10 Essential (primary) hypertension: Secondary | ICD-10-CM | POA: Diagnosis not present

## 2023-12-29 DIAGNOSIS — M62830 Muscle spasm of back: Secondary | ICD-10-CM | POA: Diagnosis not present

## 2023-12-29 DIAGNOSIS — M9905 Segmental and somatic dysfunction of pelvic region: Secondary | ICD-10-CM | POA: Diagnosis not present

## 2023-12-29 DIAGNOSIS — M25571 Pain in right ankle and joints of right foot: Secondary | ICD-10-CM | POA: Diagnosis not present

## 2023-12-29 DIAGNOSIS — M9902 Segmental and somatic dysfunction of thoracic region: Secondary | ICD-10-CM | POA: Diagnosis not present

## 2023-12-29 DIAGNOSIS — M25572 Pain in left ankle and joints of left foot: Secondary | ICD-10-CM | POA: Diagnosis not present

## 2023-12-29 DIAGNOSIS — M546 Pain in thoracic spine: Secondary | ICD-10-CM | POA: Diagnosis not present

## 2023-12-29 DIAGNOSIS — M9903 Segmental and somatic dysfunction of lumbar region: Secondary | ICD-10-CM | POA: Diagnosis not present

## 2023-12-29 NOTE — Progress Notes (Signed)
 Remote Loop Recorder Transmission

## 2023-12-30 ENCOUNTER — Other Ambulatory Visit: Payer: Self-pay | Admitting: Cardiology

## 2023-12-30 ENCOUNTER — Telehealth: Payer: Self-pay | Admitting: Cardiology

## 2023-12-30 MED ORDER — DILTIAZEM HCL ER COATED BEADS 240 MG PO CP24: 240.0000 mg | ORAL_CAPSULE | Freq: Every morning | ORAL | 0 refills | Status: DC

## 2023-12-30 MED ORDER — DILTIAZEM HCL ER COATED BEADS 120 MG PO CP24: 120.0000 mg | ORAL_CAPSULE | Freq: Every day | ORAL | 0 refills | Status: DC

## 2023-12-30 NOTE — Telephone Encounter (Signed)
 RX sent in

## 2023-12-30 NOTE — Telephone Encounter (Signed)
*  STAT* If patient is at the pharmacy, call can be transferred to refill team.   1. Which medications need to be refilled? (please list name of each medication and dose if known)  diltiazem  (CARDIZEM  CD) 120 MG 24 hr capsule  diltiazem  (CARDIZEM  CD) 240 MG 24 hr capsule           2. Which pharmacy/location (including street and city if local pharmacy) is medication to be sent to? [facility] - [place], [place] - [address]    3. Do they need a 30 day or 90 day supply?  90 day supply  Patient says she gets 2 separate prescriptions for 240 MG and 120 MG.

## 2023-12-31 ENCOUNTER — Encounter: Admitting: Cardiovascular Disease

## 2024-01-03 ENCOUNTER — Encounter: Attending: Internal Medicine | Admitting: *Deleted

## 2024-01-03 VITALS — BP 146/58 | HR 62 | Temp 97.7°F | Resp 18

## 2024-01-03 DIAGNOSIS — I4891 Unspecified atrial fibrillation: Secondary | ICD-10-CM | POA: Diagnosis not present

## 2024-01-03 DIAGNOSIS — J4520 Mild intermittent asthma, uncomplicated: Secondary | ICD-10-CM | POA: Insufficient documentation

## 2024-01-03 DIAGNOSIS — I10 Essential (primary) hypertension: Secondary | ICD-10-CM | POA: Diagnosis not present

## 2024-01-03 DIAGNOSIS — R300 Dysuria: Secondary | ICD-10-CM | POA: Diagnosis not present

## 2024-01-03 DIAGNOSIS — R52 Pain, unspecified: Secondary | ICD-10-CM | POA: Diagnosis not present

## 2024-01-03 DIAGNOSIS — E1169 Type 2 diabetes mellitus with other specified complication: Secondary | ICD-10-CM | POA: Diagnosis not present

## 2024-01-03 DIAGNOSIS — N390 Urinary tract infection, site not specified: Secondary | ICD-10-CM | POA: Diagnosis not present

## 2024-01-03 DIAGNOSIS — Z299 Encounter for prophylactic measures, unspecified: Secondary | ICD-10-CM | POA: Diagnosis not present

## 2024-01-03 DIAGNOSIS — J449 Chronic obstructive pulmonary disease, unspecified: Secondary | ICD-10-CM | POA: Diagnosis not present

## 2024-01-03 MED ORDER — MEPOLIZUMAB 100 MG ~~LOC~~ SOLR
100.0000 mg | Freq: Once | SUBCUTANEOUS | Status: AC
  Administered 2024-01-03: 100 mg via SUBCUTANEOUS

## 2024-01-03 NOTE — Progress Notes (Signed)
 Diagnosis: Asthma  Provider:   Jetty Duhamel, MD  Procedure: Injection  Nucala (Mepolizumab), Dose: 100 mg, Site: subcutaneous, Number of injections: 1  Injection Site(s): Right arm  Post Care: Observation period completed  Discharge: Condition: Good, Destination: Home . AVS Provided  Performed by:  Daleen Squibb, RN

## 2024-01-04 ENCOUNTER — Encounter: Payer: Self-pay | Admitting: Internal Medicine

## 2024-01-04 ENCOUNTER — Ambulatory Visit: Attending: Cardiology | Admitting: Cardiology

## 2024-01-04 ENCOUNTER — Other Ambulatory Visit (HOSPITAL_BASED_OUTPATIENT_CLINIC_OR_DEPARTMENT_OTHER): Payer: Self-pay

## 2024-01-04 ENCOUNTER — Other Ambulatory Visit (HOSPITAL_COMMUNITY): Payer: Self-pay

## 2024-01-04 ENCOUNTER — Telehealth: Payer: Self-pay | Admitting: *Deleted

## 2024-01-04 ENCOUNTER — Encounter: Payer: Self-pay | Admitting: Cardiology

## 2024-01-04 ENCOUNTER — Ambulatory Visit: Payer: Self-pay | Admitting: Cardiovascular Disease

## 2024-01-04 VITALS — BP 132/64 | HR 57 | Ht 61.5 in | Wt 194.4 lb

## 2024-01-04 DIAGNOSIS — I480 Paroxysmal atrial fibrillation: Secondary | ICD-10-CM | POA: Diagnosis not present

## 2024-01-04 DIAGNOSIS — R011 Cardiac murmur, unspecified: Secondary | ICD-10-CM | POA: Insufficient documentation

## 2024-01-04 DIAGNOSIS — I484 Atypical atrial flutter: Secondary | ICD-10-CM | POA: Insufficient documentation

## 2024-01-04 DIAGNOSIS — G4733 Obstructive sleep apnea (adult) (pediatric): Secondary | ICD-10-CM | POA: Insufficient documentation

## 2024-01-04 DIAGNOSIS — I4819 Other persistent atrial fibrillation: Secondary | ICD-10-CM | POA: Insufficient documentation

## 2024-01-04 MED ORDER — DOFETILIDE 250 MCG PO CAPS: 250.0000 ug | ORAL_CAPSULE | Freq: Two times a day (BID) | ORAL | 2 refills | Status: AC

## 2024-01-04 MED ORDER — DOFETILIDE 250 MCG PO CAPS
250.0000 ug | ORAL_CAPSULE | Freq: Two times a day (BID) | ORAL | 2 refills | Status: DC
  Filled 2024-01-04: qty 60, 30d supply, fill #0

## 2024-01-04 MED ORDER — DILTIAZEM HCL 30 MG PO TABS: ORAL_TABLET | ORAL | 2 refills | Status: DC

## 2024-01-04 MED ORDER — DILTIAZEM HCL 30 MG PO TABS: 30.0000 mg | ORAL_TABLET | Freq: Four times a day (QID) | ORAL | 2 refills | Status: AC | PRN

## 2024-01-04 NOTE — Patient Instructions (Addendum)
 Medication Instructions:  Your physician recommends that you continue on your current medications as directed. Please refer to the Current Medication list given to you today. We will contact you with any changes that are made to your medication.  Labwork: none  Testing/Procedures: Your physician has requested that you have an echocardiogram. Echocardiography is a painless test that uses sound waves to create images of your heart. It provides your doctor with information about the size and shape of your heart and how well your heart's chambers and valves are working. This procedure takes approximately one hour. There are no restrictions for this procedure. Please do NOT wear cologne, perfume, aftershave, or lotions (deodorant is allowed). Please arrive 15 minutes prior to your appointment time.  Please note: We ask at that you not bring children with you during ultrasound (echo/ vascular) testing. Due to room size and safety concerns, children are not allowed in the ultrasound rooms during exams. Our front office staff cannot provide observation of children in our lobby area while testing is being conducted. An adult accompanying a patient to their appointment will only be allowed in the ultrasound room at the discretion of the ultrasound technician under special circumstances. We apologize for any inconvenience.  Follow-Up: Your physician recommends that you schedule a follow-up appointment in: pending  Any Other Special Instructions Will Be Listed Below (If Applicable).  If you need a refill on your cardiac medications before your next appointment, please call your pharmacy.

## 2024-01-04 NOTE — Progress Notes (Signed)
 Cardiology Office Note  Date: 01/04/2024   ID: [name] [name], DOB 09-06-1940, MRN [id number]  History of Present Illness: [name] [name] is an 83 y.o. female last seen in January.  She has had interval follow-up with Dr. Nancey, I reviewed his notes.  She is here for a routine visit.  Reports no worsening palpitations with baseline chronic dyspnea on exertion, NYHA class II-III.  No exertional chest pain or syncope.  ILR interrogation in September showed approximately 10% AT/AF burden.  We went over her medications.  She reports compliance with current therapy and needs a refill for short acting Cardizem , she has not had to use this in quite some time.  No bleeding problems on Xarelto .  I reviewed her ECG today which shows sinus rhythm with PACs, increased voltage with decreased R wave progression, left anterior fascicular block, QTc 529 ms, could be some TU fusion.  Lab work from May showed creatinine 1.31 with GFR 40 and creatinine clearance 44.  Potassium 4.6, LDL 55.  Physical Exam: VS:  BP 132/64 (BP Location: Left Arm)   Pulse (!) 57   Ht 5' 1.5 (1.562 m)   Wt 194 lb 6.4 oz (88.2 kg)   SpO2 98%   BMI 36.14 kg/m , BMI Body mass index is 36.14 kg/m.  Wt Readings from Last 3 Encounters:  01/04/24 194 lb 6.4 oz (88.2 kg)  10/12/23 199 lb (90.3 kg)  10/06/23 193 lb (87.5 kg)    General: Patient appears comfortable at rest. HEENT: Conjunctiva and lids normal. Neck: Supple, no elevated JVP or carotid bruits. Lungs: Clear to auscultation, nonlabored breathing at rest. Cardiac: Regular rate and rhythm, no S3, 2/6 basal systolic murmur. Extremities: No pitting edema.  ECG:  An ECG dated 07/02/2023 was personally reviewed today and demonstrated:  Sinus bradycardia with left anterior fascicular block, increased voltage and decreased R wave progression, QTc 440 ms.  Labwork: 01/07/2023: BUN 17; Creatinine, Ser 1.10; Magnesium  2.1; Potassium 4.1; Sodium 137 06/08/2023:  Hemoglobin 11.2; Platelets 211  May 2025: Hemoglobin 11.7, platelets 233, BUN 22, creatinine 1.31, GFR 40, potassium 4.6, AST 16, ALT 10, cholesterol 113, triglycerides 97, HDL 40, LDL 55, TSH 2.77  Other Studies Reviewed Today:  No interval cardiac testing for review today.  Assessment and Plan:  1.  Persistent atrial fibrillation as well as atypical atrial flutter, CHA2DS2-VASc score is at least 5.  She underwent third ablation in December 2023 as discussed in Dr. Marko last note.  ILR interrogation in September demonstrated approximately 10% AT/AF burden.  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  Reducing Tikosyn  to 250 mcg twice daily, I discussed with Dr. Nancey.  Continue Cardizem  CD 240 mg in the morning and 120 mg in the evening, prescription provided for as needed short acting Cardizem  30 mg tablets.  Continue Xarelto  at 15 mg daily.   2.  OSA on CPAP.  She continues on treatment.   3.  Primary hypertension.  No change in current regimen.  She is on Aldactone  12.5 mg daily, Cozaar  100 mg daily, and hydralazine  100 mg twice daily.  4.  Mixed hyperlipidemia, on Lipitor 10 mg daily.  LDL 55 in May.  5.  Cardiac murmur, last echocardiogram was in 2021.  She had moderate LVH at that time with normal LVEF, no major valvular abnormalities.  Plan to update echocardiogram.  Disposition:  Follow up with Dr. Nancey as scheduled in the next few weeks, with me in 6 months.  Signed, [name] [name], M.D., F.A.C.C. [facility]

## 2024-01-04 NOTE — Telephone Encounter (Signed)
 Per Dr. Nancey, decrease tikosyn  to 250 mcg twice daily

## 2024-01-04 NOTE — Telephone Encounter (Signed)
 Patient informed and verbalized understanding of plan.

## 2024-01-04 NOTE — Telephone Encounter (Signed)
 Cost for tikosyn  250 mcg BID #180 at [facility] is $51.88

## 2024-01-05 ENCOUNTER — Ambulatory Visit: Attending: Cardiology

## 2024-01-05 ENCOUNTER — Ambulatory Visit: Payer: Self-pay | Admitting: Cardiology

## 2024-01-05 DIAGNOSIS — L538 Other specified erythematous conditions: Secondary | ICD-10-CM | POA: Diagnosis not present

## 2024-01-05 DIAGNOSIS — I480 Paroxysmal atrial fibrillation: Secondary | ICD-10-CM | POA: Diagnosis not present

## 2024-01-05 DIAGNOSIS — C44519 Basal cell carcinoma of skin of other part of trunk: Secondary | ICD-10-CM | POA: Diagnosis not present

## 2024-01-05 DIAGNOSIS — D1801 Hemangioma of skin and subcutaneous tissue: Secondary | ICD-10-CM | POA: Diagnosis not present

## 2024-01-05 DIAGNOSIS — Z85828 Personal history of other malignant neoplasm of skin: Secondary | ICD-10-CM | POA: Diagnosis not present

## 2024-01-05 DIAGNOSIS — L2989 Other pruritus: Secondary | ICD-10-CM | POA: Diagnosis not present

## 2024-01-05 DIAGNOSIS — Z08 Encounter for follow-up examination after completed treatment for malignant neoplasm: Secondary | ICD-10-CM | POA: Diagnosis not present

## 2024-01-05 DIAGNOSIS — D485 Neoplasm of uncertain behavior of skin: Secondary | ICD-10-CM | POA: Diagnosis not present

## 2024-01-05 DIAGNOSIS — L821 Other seborrheic keratosis: Secondary | ICD-10-CM | POA: Diagnosis not present

## 2024-01-05 DIAGNOSIS — L814 Other melanin hyperpigmentation: Secondary | ICD-10-CM | POA: Diagnosis not present

## 2024-01-05 DIAGNOSIS — Z789 Other specified health status: Secondary | ICD-10-CM | POA: Diagnosis not present

## 2024-01-05 DIAGNOSIS — D0462 Carcinoma in situ of skin of left upper limb, including shoulder: Secondary | ICD-10-CM | POA: Diagnosis not present

## 2024-01-05 DIAGNOSIS — R011 Cardiac murmur, unspecified: Secondary | ICD-10-CM | POA: Insufficient documentation

## 2024-01-05 DIAGNOSIS — L820 Inflamed seborrheic keratosis: Secondary | ICD-10-CM | POA: Diagnosis not present

## 2024-01-05 LAB — ECHOCARDIOGRAM COMPLETE
AR max vel: 1.7 cm2
AV Area VTI: 1.97 cm2
AV Area mean vel: 2.03 cm2
AV Mean grad: 13 mmHg
AV Peak grad: 30 mmHg
Ao pk vel: 2.74 m/s
Area-P 1/2: 2.36 cm2
Calc EF: 58.8 %
MV VTI: 2.23 cm2
S' Lateral: 2.3 cm
Single Plane A2C EF: 60.1 %
Single Plane A4C EF: 59.7 %

## 2024-01-05 MED ORDER — PERFLUTREN LIPID MICROSPHERE
1.0000 mL | INTRAVENOUS | Status: AC | PRN
  Administered 2024-01-05: 2 mL via INTRAVENOUS

## 2024-01-06 DIAGNOSIS — R6883 Chills (without fever): Secondary | ICD-10-CM | POA: Diagnosis not present

## 2024-01-06 DIAGNOSIS — H8109 Meniere's disease, unspecified ear: Secondary | ICD-10-CM | POA: Diagnosis not present

## 2024-01-06 DIAGNOSIS — J029 Acute pharyngitis, unspecified: Secondary | ICD-10-CM | POA: Diagnosis not present

## 2024-01-12 ENCOUNTER — Encounter (INDEPENDENT_AMBULATORY_CARE_PROVIDER_SITE_OTHER): Payer: Self-pay | Admitting: Gastroenterology

## 2024-01-12 NOTE — Progress Notes (Signed)
 HPI female never smoker followed for OSA, allergic rhinitis, asthma, Hyper IgE,  complicated by A. Fib/Coumadin , HBP, GERD, obesity NPSG prior to EMR in 2012 Office Spirometry 09/02/16-WNL. FVC 2.06/76%, FEV1 1.57/77%, ratio 0.76, FEF 25-75% 1.26/80% Allergy  labs 09/02/16- eosinophils 200, total IgE 4230 causing elevation of all specific allergen antibody levels including Aspergillus. Nucala   + 2018 PFT-10/13/16-minimal obstruction without response to dilator, minimal reduction of diffusion. FVC 2.02/77%, FEV1 1.62/83%, ratio 0.80, TLC 92%, DLCO 77% CT soft tissue neck 09/22/16-1. 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  Subsegmental atelectasis and nonspecific linear patchy densities in the lungs as described. They have a benign appearance.   Small hyperdense masses in the mediastinum are stable compared with 2015 supporting benign etiology such as ectopic thyroid  tissue.  Three-vessel prominent coronary artery calcification.  Bibasilar bronchiolectasis. Labs 09/20/2017-WBC 14,900, hemoglobin 11.2, eosinophils absent, lymphocytes low( ?steroids or viral?), BNP 220, CMET wnl IgE 04/12/2018- 2,574, 10/16/16- 4,302, 11/09/13- 3,513 Office Spirometry 04/12/2018-mild restriction of exhaled volume.  Possible mild obstructive airways.  FVC 1.9/73%, FEV1 1.4/75%, ratio 1.76, FEF 25-75% 1.2/80%  -------------------------------------------------------------------------------------------   10/12/23- 83 year old female never smoker followed for OSA, allergic Rhinitis, Asthma/ Nucala , Hyper IgE, complicated by PAFibXarelto/ Pacemaker, HBP, GERD, obesity, Anemia,  -Neb Pulmicort /  Duoneb   Anoro, Flonase , Zyrtec, Singulair , Nucala ,  CPAP 5-15/Adapt      AirSense 11 AutoSet Download- compliance 100%, AHI 2.1/hr Body weight today- 199 lbs -----OSA, Some SOB more than usual, Ringing in ears and they feel swollen   Discussed the use of AI scribe software  for clinical note transcription with the patient, who gave verbal consent to proceed.  History of Present Illness   [name] [name] is an 83 year old female with asthma and obstructive sleep apnea who presents with shortness of breath.  She experiences shortness of breath, which she partly attributes to the weather. She uses a CPAP machine for obstructive sleep apnea. Her asthma management includes Nucala  injections, a nebulizer machine, Anoro inhaler, and Singulair  pill. She finds Nucala  helpful. She did not use her nebulizer treatment on the morning of her recent endoscopy, resulting in shortness of breath. She has stopped physical therapy due to fluctuations in her condition and Medicare coverage issues.   Doing very well with CPAP as reviewed- definitely sleeps better.     Assessment and Plan:    Asthma moderate persistent Asthma management includes nebulizer, Anoro inhaler, Singulair , and Nucala  injections. Nucala  is beneficial. Increased shortness of breath may be related to weather changes or lack of exercise. - Continue nebulizer, Anoro inhaler, Singulair , and Nucala  injections. - Encourage indoor walking in air-conditioned environments to maintain stamina.  Obstructive Sleep Apnea Obstructive sleep apnea is well-managed with CPAP therapy. CPAP download shows good adherence and effectiveness.  Atrial Fibrillation Atrial fibrillation is well-controlled with episodes occurring 12.5% of the time. She is concerned about potential cardiac issues contributing to shortness of breath. Currently in sinus rhythm. - She plans follow-up with cardiologist to evaluate for other potential cardiac issues contributing to shortness of breath.  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 - Monitor symptoms and consult ENT if symptoms worsen or become more intrusive.  Dry Eye Syndrome Reports dry eye symptoms, particularly in the left eye, with a sensation of  a film over it. - Consult with eye doctor in [place] for evaluation and management of dry eye symptoms.      01/13/24- 83 year old female never smoker  followed for OSA, allergic Rhinitis, Asthma/ Nucala , Hyper IgE, complicated by PAFibXarelto/ Pacemaker, HBP, GERD, obesity, Anemia,  -Neb Pulmicort /  Duoneb   Anoro, Flonase , Zyrtec, Singulair , Nucala ,  CPAP 5-15/Adapt      AirSense 11 AutoSet Download- compliance 100%, AHI 2.2/hr Body weight today- 194 lbs Discussed the use of AI scribe software for clinical note transcription with the patient, who gave verbal consent to proceed.  History of Present Illness   [name] [name] is an 83 year old female with OSA,  atrial fibrillation and respiratory issues who presents with concerns about increased heart rate and respiratory management. She is followed by [name] for management of her atrial fibrillation    She continues CPAP comfortably, with good compliance and control.  Her heart rate has increased since her medication was decreased. She uses a loop recorder for atrial fibrillation monitoring, which requires a battery replacement this month. She feels her atrial fibrillation is well-managed.  She considers using her budesonide  nebulizer more frequently due to seasonal changes but only does so if her condition worsens. There is no current infection, and she has received a flu shot. Her medications include budesonide , Duoneb, Noro, Singulair , Nucala , Flonase , and Zyrtec.  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     Assessment and Plan:    Asthma Symptoms not well-managed. No infection signs. Avoidance of crowded places improved symptoms. - Continue budesonide  and nebulizer treatments as needed. - Monitor for increased symptoms during seasonal changes.   Obstructive Sleep  Apnea -benefits with good compliance and control  Allergic rhinitis Flonase  and Zyrtec prescribed. - Continue Flonase  and Zyrtec.  Atrial fibrillation Monitored with loop recorder. Loop recorder battery replacement needed. - Replace loop recorder battery this month.      ROS-see HPI   + = positive Constitutional:   No-   weight loss, night sweats, fevers, chills, fatigue, lassitude. HEENT:   No-  headaches, difficulty swallowing, tooth/dental problems, sore throat,       No-  sneezing, itching,  ear ache,  +nasal congestion, post nasal drip,  CV:  No-   chest pain, orthopnea, PND, +swelling in lower extremities, anasarca, dizziness, palpitations Resp: +  shortness of breath with exertion or at rest.                +productive cough,  + non-productive cough,  No- coughing up of blood.              No-change in color of mucus. + wheezing.   Skin: No-   rash or lesions. GI:  No-   heartburn, indigestion, abdominal pain, nausea, vomiting, GU:  MS:  No-   joint pain or swelling. . Neuro-     nothing unusual Psych:  No- change in mood or affect. No depression or anxiety.  No memory loss.  OBJ- Physical Exam General- Alert, Oriented, Affect-appropriate, Distress- none acute, + obese Skin- rash-none, lesions- none, excoriation- none Lymphadenopathy- none Head- atraumatic            Eyes- Gross vision intact, PERRLA, conjunctivae and secretions clear            Ears- Hearing, canals-normal            Nose- No turbinate edema, no-Septal dev, mucus, polyps, erosion, perforation             Throat- Mallampati III-IV , mucosa- not red,  drainage- none, tonsils- atrophic Neck- flexible , trachea midline, no stridor , thyroid  nl, carotid no bruit Chest - symmetrical excursion , unlabored           Heart/CV- RR today/ hx AFib(no pacemaker) , +murmur1/6S , no gallop, no rub, nl  s1 s2                  - JVD- none , edema- none, stasis changes- none, varices- none           Lung- + trace  crackle L base, Wheeze-none , cough-none,                         dullness-none, rub- none           Chest wall-  Abd- Br/ Gen/ Rectal- Not done, not indicated Extrem- cyanosis- none, clubbing, none, atrophy- none, strength- nl, + cane Neuro- grossly intact to observation       Neysa Reggy BIRCH, MD

## 2024-01-13 ENCOUNTER — Encounter: Payer: Self-pay | Admitting: Internal Medicine

## 2024-01-13 ENCOUNTER — Ambulatory Visit: Admitting: Internal Medicine

## 2024-01-13 VITALS — BP 128/64 | HR 69 | Temp 97.8°F | Ht 61.5 in | Wt 194.4 lb

## 2024-01-13 DIAGNOSIS — G4733 Obstructive sleep apnea (adult) (pediatric): Secondary | ICD-10-CM

## 2024-01-13 NOTE — Patient Instructions (Signed)
 Glad you are doing well with no changes needed today. Please let us  know if we can help.

## 2024-01-14 ENCOUNTER — Telehealth: Payer: Self-pay | Admitting: Cardiovascular Disease

## 2024-01-14 NOTE — Telephone Encounter (Signed)
  STAT if HR is under 50 or over 120 (normal HR is 60-100 beats per minute)  What is your heart rate? 128  Do you have a log of your heart rate readings (document readings)? 110, 128  Do you have any other symptoms? Patient said for the past few weeks her HR been jumping into 100+. She feels SOB in exertion and get chest pressure when her HR is up. She want to know hat she needs to do when that happens

## 2024-01-14 NOTE — Telephone Encounter (Signed)
 Patient reports since Tikosyn  dose was decreased she has had 2 episodes of tachycardia, SOB, and chest pressure. HR would go up to 120's. Last episode was this morning. Patient states she lifted two 30 lb bags of cat food and carried them upstairs and her HR jumped to 128. She had to sit down to rest, took a dose of diltiazem  30 mg and within 45 minutes symptoms resolved and HR went down to 63. Episode lasted for 2 hours total.  Patient is used to being active, and is afraid this is going to continue to slow her down. She would like to know if there is anything she can do differently to prevent these episodes from occurring or making her slow down from her usual routine to prevent triggering them.  Advised on ED precautions if symptoms return, patient verbalized understanding.  Patient has appt with Dr. Nancey on 01/24/24.  Will forward to [name] and Dr. Nancey to review and advise.

## 2024-01-17 NOTE — Telephone Encounter (Signed)
 Left message to call back.

## 2024-01-17 NOTE — Telephone Encounter (Signed)
 Patient returned call

## 2024-01-17 NOTE — Telephone Encounter (Signed)
 Debera Jayson MATSU, MD to [facility] Triage  Mealor, Eulas BRAVO, MD  Lenon Jinnie HERO, RN      01/16/24  7:08 PM No specific changes recommended at this time.  She has a visit coming up soon with Dr. Nancey, would keep track of interval symptoms pending that visit in case other adjustments are necessary.  S/w the patient and gave the information above. She verbalized understanding.

## 2024-01-18 ENCOUNTER — Encounter: Payer: Self-pay | Admitting: Podiatry

## 2024-01-18 ENCOUNTER — Ambulatory Visit: Admitting: Podiatry

## 2024-01-18 DIAGNOSIS — L84 Corns and callosities: Secondary | ICD-10-CM

## 2024-01-18 DIAGNOSIS — M79674 Pain in right toe(s): Secondary | ICD-10-CM | POA: Diagnosis not present

## 2024-01-18 DIAGNOSIS — M79675 Pain in left toe(s): Secondary | ICD-10-CM

## 2024-01-18 DIAGNOSIS — G6289 Other specified polyneuropathies: Secondary | ICD-10-CM | POA: Diagnosis not present

## 2024-01-18 DIAGNOSIS — B351 Tinea unguium: Secondary | ICD-10-CM | POA: Diagnosis not present

## 2024-01-19 ENCOUNTER — Ambulatory Visit: Admitting: Cardiovascular Disease

## 2024-01-23 ENCOUNTER — Encounter: Payer: Self-pay | Admitting: Podiatry

## 2024-01-23 NOTE — Progress Notes (Signed)
  Subjective:  Patient ID: Elveria LITTIE Piety, female    DOB: 1940-10-08,  MRN: [id number]  Elveria LITTIE Piety presents to clinic today for corn(s) right foot and painful mycotic toenails that are difficult to trim. Painful toenails interfere with ambulation. Aggravating factors include wearing enclosed shoe gear. Pain is relieved with periodic professional debridement. Painful corns are aggravated when weightbearing when wearing enclosed shoe gear. Pain is relieved with periodic professional debridement.  Chief Complaint  Patient presents with   Nail Problem    Patient stated she hast saw her PCP was 3 weeks ago and her PCP name is Eligio Fairly   New problem(s): None.   PCP is Fairly Eligio, MD.  Allergies  Allergen Reactions   Bupropion Hcl Other (See Comments)    Suicidal Thoughts.    Escitalopram Oxalate Other (See Comments)    Suicidal Thoughts.    Fluticasone -Salmeterol Other (See Comments)    Advair - Caused patient to go into Afib.    Lisinopril Cough   Serevent Other (See Comments)    Caused patient to go into Afib    Review of Systems: Negative except as noted in the HPI.  Objective: No changes noted in today's physical examination. There were no vitals filed for this visit. SAROYA RICCOBONO is a pleasant 83 y.o. female in NAD. AAO x 3.  Vascular Examination: Capillary refill time immediate b/l. Vascular status intact b/l with palpable pedal pulses. Pedal hair present b/l. No pain with calf compression b/l. Skin temperature gradient WNL b/l. No cyanosis or clubbing b/l. No ischemia or gangrene noted b/l.   Neurological Examination: Sensation grossly intact b/l with 10 gram monofilament. Vibratory sensation intact b/l. Pt has subjective symptoms of neuropathy.  Dermatological Examination: Pedal skin with normal turgor, texture and tone b/l.  No open wounds. No interdigital macerations.   Toenails 1-5 b/l thick, discolored, elongated with subungual debris and pain on  dorsal palpation.   Hyperkeratotic lesion(s) medial PIPJ of R 2nd toe.  No erythema, no edema, no drainage, no fluctuance.  Musculoskeletal Examination: Muscle strength 5/5 to all lower extremity muscle groups bilaterally. 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 Pes planus deformity noted bilateral LE.  Radiographs: None  Assessment/Plan: 1. Pain due to onychomycosis of toenails of both feet   2. Corns   3. Other polyneuropathy   Patient was evaluated and treated. All patient's and/or POA's questions/concerns addressed on today's visit. Toenails 1-5 b/l debrided in length and girth without incident. Corn(s) medial PIPJ of right second digit pared with sharp debridement without incident. Continue soft, supportive shoe gear daily. Report any pedal injuries to medical professional. Call office if there are any questions/concerns.  Return in about 9 weeks (around 03/21/2024).  Delon LITTIE Merlin, DPM      [facility] LOCATION: [address] [place] [address]                   Office [phone]   [facility] LOCATION: [address] [place], [place] [address] Office [phone]

## 2024-01-24 ENCOUNTER — Encounter: Payer: Self-pay | Admitting: Cardiovascular Disease

## 2024-01-24 ENCOUNTER — Ambulatory Visit: Attending: Cardiovascular Disease | Admitting: Cardiovascular Disease

## 2024-01-24 VITALS — BP 128/70 | HR 62 | Ht 61.5 in | Wt 193.6 lb

## 2024-01-24 DIAGNOSIS — I480 Paroxysmal atrial fibrillation: Secondary | ICD-10-CM | POA: Diagnosis not present

## 2024-01-24 NOTE — Patient Instructions (Signed)
 Medication Instructions:  Your physician recommends that you continue on your current medications as directed. Please refer to the Current Medication list given to you today.  *If you need a refill on your cardiac medications before your next appointment, please call your pharmacy*  Lab Work: None ordered.  If you have labs (blood work) drawn today and your tests are completely normal, you will receive your results only by: MyChart Message (if you have MyChart) OR A paper copy in the mail If you have any lab test that is abnormal or we need to change your treatment, we will call you to review the results.  Testing/Procedures: None ordered.   Follow-Up: At [facility], you and your health needs are our priority.  As part of our continuing mission to provide you with exceptional heart care, our providers are all part of one team.  This team includes your primary Cardiologist (physician) and Advanced Practice Providers or APPs (Physician Assistants and Nurse Practitioners) who all work together to provide you with the care you need, when you need it.  Your next appointment:   6 months with Dr Mealor

## 2024-01-24 NOTE — Progress Notes (Signed)
 Cardiology Office Note:    Date:  01/24/2024   ID:  [name] [name], DOB 06/28/1940, MRN [id number]  PCP:  Maree Isles, MD   [facility] Providers Cardiologist:  Jayson Sierras, MD Electrophysiologist:  Eulas FORBES Furbish, MD     Referring MD: Maree Isles, MD   Chief complaint: fatigue, shortness of breath  History of Present Illness:    [name] [name] is a 83 y.o. female with a hx of paroxysmal AF, atrial flutter, COPD, OSA who presents for EP follow-up.  She is a former patient of Dr. Kelsie and underwent an AF ablation in June, 2022. There was some reconnection of the right inferior pulmonary vein and the left superior veins. These were re-ablated. Additionally, a posterior wall box lesion set was created.  She has been managed on Tikosyn  and xarelto  since. She saw Dr. Sierras in follow-up a few days ago and noted that she has not been feeling well recently. Her ILR has shown an increase in AF burden, now 65% (9/23 month 50%, 8/23 was 25%). ECG during that clinic visit showed an atypical flutter with a cycle length of about 300 msec.  She underwent a 3rd AF ablation in December, 2023.  At that time, there was a small amount of breakthrough at the inferior line of the posterior left atrial box.  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  I reinforced the prior posterior box lesion set and created a mitral line.   Her loop recorder reached RRT.  She was send she been having the device replaced.  Recently, her Tikosyn  was decreased because her QTc was greater than 500.  Shortly after the dose reduction, she did have 2 episodes of rapid heart rates while carrying a heavy load up a flight of steps.  The rate came down only after about a half an hour after taking diltiazem  30 mg.  She has a skin lesion on her left breast, planning to have the lesion removed later this week.   No chest pain, syncope,  pre-syncope.     EKGs/Labs/Other Studies Reviewed:     EKG:  EKG Interpretation Date/Time:  Monday January 24 2024 13:24:32 EDT Ventricular Rate:  62 PR Interval:    QRS Duration:  110 QT Interval:  436 QTC Calculation: 442 R Axis:   -47  Text Interpretation: Normal sinus rhythm Left axis deviation Incomplete right bundle branch block Moderate voltage criteria for LVH, may be normal variant ( R in aVL , Cornell product ) Possible Anteroseptal infarct , age undetermined When compared with ECG of 04-Jan-2024 08:18, QT has shortened Confirmed by Furbish Eulas [phone]) on 01/24/2024 1:42:51 PM    Recent Labs: 06/08/2023: Hemoglobin 11.2; Platelets 211    Risk Assessment/Calculations:    CHA2DS2-VASc Score = 5   This indicates a 7.2% annual risk of stroke. The patient's score is based upon: CHF History: 0 HTN History: 1 Diabetes History: 0 Stroke History: 0 Vascular Disease History: 1 Age Score: 2 Gender Score: 1            Physical Exam:    VS:  BP 128/70   Pulse 62   Ht 5' 1.5 (1.562 m)   Wt 193 lb 9.6 oz (87.8 kg)   SpO2 98%   BMI 35.99 kg/m     Wt Readings from Last 3 Encounters:  01/24/24 193 lb 9.6 oz (87.8 kg)  01/13/24 194 lb 6.4 oz (88.2 kg)  01/04/24 194 lb  6.4 oz (88.2 kg)     GEN: Well nourished, well developed in no acute distress CARDIAC: irregular, no murmurs, rubs, gallops RESPIRATORY:  Normal work of breathing MUSCULOSKELETAL: trace edema    ASSESSMENT & PLAN:    Persistent atrial fibrillation: s/p repeat (3rd) ablation 03/18/2022. There were not many targets for ablation at that time. I do not think she would benefit from another ablation.       - still having AF with a burden of about 21%       - continue Tikosyn . Labs 07/2023 reviewed, EKG reviewed  Loop recorder at [facility]: She will be undergoing excision of a skin lesion adjacent to the loop recorder later this week. I think would be reasonable to remove the device, we discussed  this.  She is not eager to have it removed right now.  I think replacing the loop would have modest benefit.  We will follow-up in 6 months.  Atypical atrial flutter: documented on 10/24 ECG. Likely involving the posterior LA.   Secondary hypercoagulable state: continue anticoagulation for CHADS2Vasc of 3       Medication Adjustments/Labs and Tests Ordered: Current medicines are reviewed at length with the patient today.  Concerns regarding medicines are outlined above.  Orders Placed This Encounter  Procedures   EKG 12-Lead   No orders of the defined types were placed in this encounter.    Signed, Eulas FORBES Furbish, MD  01/24/2024 1:44 PM    [facility]

## 2024-01-25 DIAGNOSIS — D0462 Carcinoma in situ of skin of left upper limb, including shoulder: Secondary | ICD-10-CM | POA: Diagnosis not present

## 2024-01-27 ENCOUNTER — Ambulatory Visit

## 2024-01-27 DIAGNOSIS — I480 Paroxysmal atrial fibrillation: Secondary | ICD-10-CM

## 2024-01-27 LAB — CUP PACEART REMOTE DEVICE CHECK
Date Time Interrogation Session: 20251029230649
Implantable Pulse Generator Implant Date: 20220826

## 2024-01-31 ENCOUNTER — Encounter: Attending: Internal Medicine | Admitting: Emergency Medicine

## 2024-01-31 VITALS — BP 142/59 | HR 64 | Temp 97.5°F | Resp 18

## 2024-01-31 DIAGNOSIS — J4520 Mild intermittent asthma, uncomplicated: Secondary | ICD-10-CM | POA: Diagnosis present

## 2024-01-31 MED ORDER — MEPOLIZUMAB 100 MG ~~LOC~~ SOLR
100.0000 mg | Freq: Once | SUBCUTANEOUS | Status: AC
  Administered 2024-01-31: 100 mg via SUBCUTANEOUS

## 2024-01-31 NOTE — Progress Notes (Signed)
 Diagnosis: Asthma  Provider:  Reggy Salt   Procedure: Injection  Nucala  (Mepolizumab ), Dose: 100 mg, Site: subcutaneous, Number of injections: 1  Injection Site(s): Right arm  Post Care: Patient declined observation  Discharge: Condition: Good, Destination: Home . AVS Declined  Performed by:  Delon ONEIDA Officer, RN

## 2024-02-01 DIAGNOSIS — H04123 Dry eye syndrome of bilateral lacrimal glands: Secondary | ICD-10-CM | POA: Diagnosis not present

## 2024-02-01 DIAGNOSIS — Z961 Presence of intraocular lens: Secondary | ICD-10-CM | POA: Diagnosis not present

## 2024-02-01 DIAGNOSIS — H524 Presbyopia: Secondary | ICD-10-CM | POA: Diagnosis not present

## 2024-02-02 DIAGNOSIS — C44519 Basal cell carcinoma of skin of other part of trunk: Secondary | ICD-10-CM | POA: Diagnosis not present

## 2024-02-02 DIAGNOSIS — L905 Scar conditions and fibrosis of skin: Secondary | ICD-10-CM | POA: Diagnosis not present

## 2024-02-02 NOTE — Progress Notes (Signed)
 Remote Loop Recorder Transmission

## 2024-02-03 ENCOUNTER — Ambulatory Visit: Payer: Self-pay | Admitting: Cardiovascular Disease

## 2024-02-07 DIAGNOSIS — R519 Headache, unspecified: Secondary | ICD-10-CM | POA: Diagnosis not present

## 2024-02-07 DIAGNOSIS — I10 Essential (primary) hypertension: Secondary | ICD-10-CM | POA: Diagnosis not present

## 2024-02-07 DIAGNOSIS — Z299 Encounter for prophylactic measures, unspecified: Secondary | ICD-10-CM | POA: Diagnosis not present

## 2024-02-07 DIAGNOSIS — K219 Gastro-esophageal reflux disease without esophagitis: Secondary | ICD-10-CM | POA: Diagnosis not present

## 2024-02-07 DIAGNOSIS — J449 Chronic obstructive pulmonary disease, unspecified: Secondary | ICD-10-CM | POA: Diagnosis not present

## 2024-02-10 ENCOUNTER — Ambulatory Visit

## 2024-02-10 NOTE — Progress Notes (Signed)
 Patient was shown shoe choices is not diabetic also re-laced shoes to reduce pressure off of instep  Patient will call if she wants to order shoes from us   Would be patient pay  [name] [name] CPed,

## 2024-02-11 ENCOUNTER — Other Ambulatory Visit: Payer: Self-pay | Admitting: Primary Care

## 2024-02-26 DIAGNOSIS — N390 Urinary tract infection, site not specified: Secondary | ICD-10-CM | POA: Diagnosis not present

## 2024-02-27 ENCOUNTER — Ambulatory Visit: Attending: Internal Medicine

## 2024-02-28 ENCOUNTER — Encounter

## 2024-02-29 ENCOUNTER — Ambulatory Visit (INDEPENDENT_AMBULATORY_CARE_PROVIDER_SITE_OTHER): Admitting: Gastroenterology

## 2024-03-03 DIAGNOSIS — R918 Other nonspecific abnormal finding of lung field: Secondary | ICD-10-CM | POA: Diagnosis not present

## 2024-03-03 DIAGNOSIS — Z299 Encounter for prophylactic measures, unspecified: Secondary | ICD-10-CM | POA: Diagnosis not present

## 2024-03-03 DIAGNOSIS — J441 Chronic obstructive pulmonary disease with (acute) exacerbation: Secondary | ICD-10-CM | POA: Diagnosis not present

## 2024-03-03 DIAGNOSIS — I4891 Unspecified atrial fibrillation: Secondary | ICD-10-CM | POA: Diagnosis not present

## 2024-03-03 DIAGNOSIS — R0602 Shortness of breath: Secondary | ICD-10-CM | POA: Diagnosis not present

## 2024-03-03 DIAGNOSIS — R059 Cough, unspecified: Secondary | ICD-10-CM | POA: Diagnosis not present

## 2024-03-06 ENCOUNTER — Encounter: Attending: Internal Medicine | Admitting: *Deleted

## 2024-03-06 VITALS — BP 143/57 | HR 66 | Temp 97.6°F | Resp 18

## 2024-03-06 DIAGNOSIS — J4520 Mild intermittent asthma, uncomplicated: Secondary | ICD-10-CM | POA: Diagnosis present

## 2024-03-06 MED ORDER — MEPOLIZUMAB 100 MG ~~LOC~~ SOLR
100.0000 mg | Freq: Once | SUBCUTANEOUS | Status: AC
  Administered 2024-03-06: 100 mg via SUBCUTANEOUS

## 2024-03-06 NOTE — Progress Notes (Signed)
 Diagnosis: Asthma  Provider:   Jetty Duhamel, MD  Procedure: Injection  Nucala (Mepolizumab), Dose: 100 mg, Site: subcutaneous, Number of injections: 1  Injection Site(s): Right arm  Post Care: Observation period completed  Discharge: Condition: Good, Destination: Home . AVS Provided  Performed by:  Daleen Squibb, RN

## 2024-03-13 ENCOUNTER — Telehealth: Payer: Self-pay | Admitting: Cardiology

## 2024-03-13 NOTE — Telephone Encounter (Signed)
°  Where are you bleeding? Nose    When did the bleeding start? 1.5 week ago    Has the bleeding stopped? Is it continuing? Has been bleeding for about a week. was initially only bled at night. Not a stream of blood but does have blood everytime she blows her nose - have had a sinus infection for 1.5 weeks.   Do you take blood thinners? Xarelto  - wants to know if stopping her xarelto  would help stop her bleeding. She isn't sure how else to stop the bleeding

## 2024-03-13 NOTE — Telephone Encounter (Signed)
 Recommend patient try saline nasal spray and follow up with PCP

## 2024-03-13 NOTE — Telephone Encounter (Signed)
 Patient notified and verbalized understanding.

## 2024-03-14 ENCOUNTER — Ambulatory Visit: Admitting: Podiatry

## 2024-03-16 ENCOUNTER — Encounter (INDEPENDENT_AMBULATORY_CARE_PROVIDER_SITE_OTHER): Payer: Self-pay | Admitting: *Deleted

## 2024-03-26 ENCOUNTER — Other Ambulatory Visit: Payer: Self-pay | Admitting: Cardiology

## 2024-03-26 ENCOUNTER — Other Ambulatory Visit (INDEPENDENT_AMBULATORY_CARE_PROVIDER_SITE_OTHER): Payer: Self-pay | Admitting: Gastroenterology

## 2024-03-27 ENCOUNTER — Encounter: Admitting: Emergency Medicine

## 2024-03-27 ENCOUNTER — Other Ambulatory Visit: Payer: Self-pay | Admitting: Podiatry

## 2024-03-27 VITALS — BP 159/64 | HR 59 | Temp 97.6°F

## 2024-03-27 DIAGNOSIS — J4520 Mild intermittent asthma, uncomplicated: Secondary | ICD-10-CM | POA: Diagnosis not present

## 2024-03-27 MED ORDER — MEPOLIZUMAB 100 MG ~~LOC~~ SOLR
100.0000 mg | Freq: Once | SUBCUTANEOUS | Status: AC
  Administered 2024-03-27: 100 mg via SUBCUTANEOUS

## 2024-03-27 NOTE — Progress Notes (Signed)
 Diagnosis: Asthma  Provider:   Jetty Duhamel   Procedure: Injection  Nucala (Mepolizumab), Dose: 100 mg, Site: subcutaneous, Number of injections: 1  Injection Site(s): Right arm  Post Care: Patient declined observation  Discharge: Condition: Good, Destination: Home . AVS Provided  Performed by:  Arrie Senate, RN

## 2024-03-29 ENCOUNTER — Ambulatory Visit

## 2024-03-30 ENCOUNTER — Telehealth: Payer: Self-pay

## 2024-03-30 NOTE — Telephone Encounter (Signed)
 Auth Submission: NO AUTH NEEDED Site of care: Site of care: [facility] Payer: medicare a/b, bcbs supp Medication & CPT/J Code(s) submitted: Nucala  (Mepolizumab ) G7817 Diagnosis Code:  Route of submission (phone, fax, portal): portal Phone # Fax # Auth type: Buy/Bill HB Units/visits requested: 100mg  q4weeks Reference number:  Approval from: 03/30/24 to 03/29/25

## 2024-04-03 ENCOUNTER — Ambulatory Visit: Admitting: Podiatry

## 2024-04-03 DIAGNOSIS — Z91198 Patient's noncompliance with other medical treatment and regimen for other reason: Secondary | ICD-10-CM

## 2024-04-03 NOTE — Progress Notes (Signed)
 1. Failure to attend appointment with reason given    Appointment canceled by patient.

## 2024-04-10 ENCOUNTER — Other Ambulatory Visit: Payer: Self-pay | Admitting: Cardiology

## 2024-04-10 DIAGNOSIS — I4892 Unspecified atrial flutter: Secondary | ICD-10-CM

## 2024-04-10 DIAGNOSIS — I480 Paroxysmal atrial fibrillation: Secondary | ICD-10-CM

## 2024-04-10 MED ORDER — RIVAROXABAN 15 MG PO TABS: 15.0000 mg | ORAL_TABLET | Freq: Every day | ORAL | 1 refills | Status: AC

## 2024-04-18 ENCOUNTER — Other Ambulatory Visit: Payer: Self-pay | Admitting: Cardiology

## 2024-04-18 ENCOUNTER — Telehealth (INDEPENDENT_AMBULATORY_CARE_PROVIDER_SITE_OTHER): Payer: Self-pay

## 2024-04-18 NOTE — Telephone Encounter (Signed)
 Who is your primary care physician: Eligio Fairly, MD  Are you diabetic? If yes, Type 1 or Type 2?    no  Do you have a prosthetic or mechanical heart valve? no  Do you have a pacemaker/defibrillator?   no  Have you had endocarditis/atrial fibrillation? yes  Have you had joint replacement within the last 12 months?  no  Do you tend to be constipated or have to use laxatives? no  Do you have any history of drugs or alcohol ?  no  Do you use supplemental oxygen ?  no  Have you had a stroke or heart attack within the last 6 months? no  Do you take weight loss medication?  no  For female patients: have you had a hysterectomy?  no                                     are you post menopausal?       yes                                            do you still have your menstrual cycle? no      Do you take any blood-thinning medications such as: (aspirin , warfarin, Plavix, Aggrenox)  yes  If yes we need the name, milligram, dosage and who is prescribing doctor Xarelto  15mg  once daily in the evening, prescribed by Sheppard Sierras at [phone]  Current Outpatient Medications  Medication Sig Dispense Refill   antiseptic oral rinse (BIOTENE) LIQD 15 mLs by Mouth Rinse route daily as needed for dry mouth.     atorvastatin  (LIPITOR) 10 MG tablet Take 10 mg by mouth daily at 6 PM.     azelastine  (ASTELIN ) 0.1 % nasal spray PLACE 2 SPRAYS into BOTH nostrils TWICE DAILY. USE in each nostrils AS DIRECTED 30 mL 11   Biotin  1 MG CAPS Take 1,000 mg by mouth 3 (three) times a week.      budesonide  (PULMICORT ) 0.5 MG/2ML nebulizer solution Take 0.5 mg by nebulization daily.     Calcium  Carbonate-Vit D-Min (QC CALCIUM -MAGNESIUM -ZINC-D3) 333.4-133 MG-UNIT TABS Take 1 tablet by mouth 2 (two) times daily.      cetirizine (ZYRTEC) 10 MG tablet Take 10 mg by mouth at bedtime as needed for allergies.     clindamycin (CLEOCIN) 300 MG capsule SMARTSIG:2 Capsule(s) By Mouth     diltiazem  (CARDIZEM  CD) 120 MG 24 hr  capsule TAKE 1 CAPSULE BY MOUTH AT BEDTIME 90 capsule 0   diltiazem  (CARDIZEM  CD) 240 MG 24 hr capsule TAKE 1 CAPSULE BY MOUTH EVERY MORNING 90 capsule 0   diltiazem  (CARDIZEM ) 30 MG tablet Take 1 tablet (30 mg total) by mouth every 6 (six) hours as needed (palpitations). TAKE 1 TABLET BY MOUTH AS NEEDED FOR PALPITATIONS 45 tablet 2   dofetilide  (TIKOSYN ) 250 MCG capsule Take 1 capsule (250 mcg total) by mouth 2 (two) times daily. 180 capsule 2   famotidine  (PEPCID ) 20 MG tablet Take 2 tablets (40 mg total) by mouth at bedtime as needed. 30 tablet 1   fluticasone  (FLONASE ) 50 MCG/ACT nasal spray Place 1 spray into both nostrils 2 (two) times daily. (Patient taking differently: Place 1 spray into both nostrils daily.)     gabapentin  (NEURONTIN ) 100 MG capsule TAKE ONE CAPSULE BY MOUTH  AT BEDTIME 90 capsule 0   hydrALAZINE  (APRESOLINE ) 100 MG tablet Take 100 mg by mouth 2 (two) times daily.     HYDROcodone  bit-homatropine (HYCODAN) 5-1.5 MG/5ML syrup Take 5 mLs by mouth every 6 (six) hours as needed for cough.     Hypertonic Nasal Wash (SINUS RINSE) PACK Place 1 each into the nose daily as needed (clear nasal passage). Neilmed sinus rinse     ipratropium-albuterol  (DUONEB) 0.5-2.5 (3) MG/3ML SOLN Take 3 mLs by nebulization daily.     losartan  (COZAAR ) 100 MG tablet TAKE 1 TABLET BY MOUTH DAILY 90 tablet 1   Magnesium  250 MG TABS Take 250 mg by mouth daily.     montelukast  (SINGULAIR ) 10 MG tablet Take 10 mg by mouth at bedtime.     pantoprazole  (PROTONIX ) 40 MG tablet Take 40 mg by mouth 2 (two) times daily.     Polyethyl Glyc-Propyl Glyc PF (SYSTANE ULTRA PF) 0.4-0.3 % SOLN Place 1 drop into both eyes as needed (dry eyes).     potassium chloride  (KLOR-CON ) 10 MEQ tablet Take 1 tablet (10 mEq total) by mouth daily. 90 tablet 2   Rivaroxaban  (XARELTO ) 15 MG TABS tablet Take 1 tablet (15 mg total) by mouth daily with supper. 90 tablet 1   spironolactone  (ALDACTONE ) 25 MG tablet TAKE 1/2 TABLET BY MOUTH  DAILY 45 tablet 2   umeclidinium-vilanterol (ANORO ELLIPTA ) 62.5-25 MCG/ACT AEPB INHALE ONE PUFF INTO THE LUNGS DAILY 3 each 3   Wheat Dextrin (BENEFIBER ON THE GO) POWD Take 10 mLs by mouth daily.     White Petrolatum-Mineral Oil (SYSTANE NIGHTTIME) OINT Place 1 drop into both eyes at bedtime.     Current Facility-Administered Medications  Medication Dose Route Frequency Provider Last Rate Last Admin   methylPREDNISolone  acetate (DEPO-MEDROL ) injection 80 mg  80 mg Intramuscular Once Neysa Reggy BIRCH, MD        Allergies[1]  Primary Insurance Name: Medicare  Best number where you can be reached: [phone]     [1]  Allergies Allergen Reactions   Bupropion Hcl Other (See Comments)    Suicidal Thoughts.    Escitalopram Oxalate Other (See Comments)    Suicidal Thoughts.    Fluticasone -Salmeterol Other (See Comments)    Advair - Caused patient to go into Afib.    Lisinopril Cough   Serevent Other (See Comments)    Caused patient to go into Afib

## 2024-04-19 NOTE — Telephone Encounter (Signed)
 Hi Devere,  Can you please schedule a follow up appointment for this patient in 4-6 weeks with me or Chelsea and we can discuss repeat upper endoscopy   Diagnosis : Low grade dysplasia polyps  Thanks,  Monetta Lick Faizan Corita Allinson , MD Gastroenterology and Hepatology [facility]

## 2024-04-21 ENCOUNTER — Other Ambulatory Visit: Payer: Self-pay | Admitting: Cardiology

## 2024-04-24 ENCOUNTER — Ambulatory Visit

## 2024-04-28 ENCOUNTER — Encounter: Attending: Internal Medicine | Admitting: *Deleted

## 2024-04-28 ENCOUNTER — Telehealth: Payer: Self-pay

## 2024-04-28 VITALS — BP 159/56 | HR 66 | Temp 97.6°F | Resp 18

## 2024-04-28 DIAGNOSIS — J4520 Mild intermittent asthma, uncomplicated: Secondary | ICD-10-CM | POA: Diagnosis present

## 2024-04-28 MED ORDER — MEPOLIZUMAB 100 MG ~~LOC~~ SOLR
100.0000 mg | Freq: Once | SUBCUTANEOUS | Status: AC
  Administered 2024-04-28: 100 mg via SUBCUTANEOUS

## 2024-04-28 NOTE — Telephone Encounter (Signed)
 Copied from CRM #[id number]. Topic: Appointments - Transfer of Care >> Apr 28, 2024 11:17 AM Ismael A wrote: Pt is requesting to transfer FROM: Dr. Neysa  Pt is requesting to transfer TO: Almarie Ferrari, NP Reason for requested transfer: Dr. Neysa retired It is the responsibility of the team the patient would like to transfer to Meg Ferrari, NP ) to reach out to the patient if for any reason this transfer is not acceptable.   Pt has an upcoming appt with Dr Neda. NFN

## 2024-04-28 NOTE — Progress Notes (Signed)
 Diagnosis: Asthma  Provider:  Reggy Salt, MD  Procedure: Injection  Nucala  (Mepolizumab ), Dose: 100 mg, Site: subcutaneous, Number of injections: 1  Injection Site(s): Right arm  Post Care: Observation period completed  Discharge: Condition: Good, Destination: Home . AVS Declined  Performed by:  Baldwin Darice Helling, RN

## 2024-04-29 ENCOUNTER — Ambulatory Visit: Attending: Internal Medicine

## 2024-05-03 ENCOUNTER — Telehealth: Payer: Self-pay | Admitting: Cardiovascular Disease

## 2024-05-03 NOTE — Telephone Encounter (Signed)
 Called re missed remote

## 2024-05-04 NOTE — Telephone Encounter (Signed)
 Thank you for making us  aware.   I have cancelled all remote monitoring appts and marked inactive in [employer].   It is likely the [phone] number was either [employer] or [employer] attempting to reach her not realizing ILR is at end of life.

## 2024-05-04 NOTE — Telephone Encounter (Signed)
 Patient called regarding the vm - her monitor isn't disconnected, it is dead. She is scheduled for loop explant and re-implant on 3/27 with Dr. Nancey.   Device - she states she has also received a call from an [phone] number regarding the same, would this be [employer]?

## 2024-05-08 ENCOUNTER — Ambulatory Visit: Admitting: Cardiovascular Disease

## 2024-05-22 ENCOUNTER — Ambulatory Visit

## 2024-05-29 ENCOUNTER — Ambulatory Visit

## 2024-05-30 ENCOUNTER — Ambulatory Visit

## 2024-06-14 ENCOUNTER — Ambulatory Visit: Admitting: Podiatry

## 2024-06-15 ENCOUNTER — Ambulatory Visit (INDEPENDENT_AMBULATORY_CARE_PROVIDER_SITE_OTHER): Admitting: Gastroenterology

## 2024-06-19 ENCOUNTER — Ambulatory Visit

## 2024-06-21 ENCOUNTER — Ambulatory Visit: Admitting: Cardiology

## 2024-06-23 ENCOUNTER — Ambulatory Visit: Admitting: Cardiovascular Disease

## 2024-06-28 ENCOUNTER — Ambulatory Visit

## 2024-06-30 ENCOUNTER — Ambulatory Visit

## 2024-07-11 ENCOUNTER — Encounter: Admitting: Primary Care

## 2024-07-31 ENCOUNTER — Ambulatory Visit

## 2024-08-03 ENCOUNTER — Encounter: Admitting: Pulmonary Disease
